# Patient Record
Sex: Male | Born: 1955
Health system: Southern US, Community
[De-identification: ages and names within clinical notes are randomized; demographics above are authoritative.]

## PROBLEM LIST (undated history)

## (undated) ENCOUNTER — Emergency Department (HOSPITAL_COMMUNITY): Admission: EM | Payer: BC Managed Care – PPO | Source: Home / Self Care

## (undated) DIAGNOSIS — J45909 Unspecified asthma, uncomplicated: Secondary | ICD-10-CM

## (undated) DIAGNOSIS — K8020 Calculus of gallbladder without cholecystitis without obstruction: Secondary | ICD-10-CM

## (undated) DIAGNOSIS — D649 Anemia, unspecified: Secondary | ICD-10-CM

## (undated) DIAGNOSIS — I5032 Chronic diastolic (congestive) heart failure: Secondary | ICD-10-CM

## (undated) DIAGNOSIS — E119 Type 2 diabetes mellitus without complications: Secondary | ICD-10-CM

## (undated) DIAGNOSIS — I313 Pericardial effusion (noninflammatory): Secondary | ICD-10-CM

## (undated) DIAGNOSIS — Z789 Other specified health status: Secondary | ICD-10-CM

## (undated) DIAGNOSIS — M109 Gout, unspecified: Secondary | ICD-10-CM

## (undated) DIAGNOSIS — N1411 Contrast-induced nephropathy: Secondary | ICD-10-CM

## (undated) DIAGNOSIS — G510 Bell's palsy: Secondary | ICD-10-CM

## (undated) DIAGNOSIS — I3139 Other pericardial effusion (noninflammatory): Secondary | ICD-10-CM

## (undated) DIAGNOSIS — N200 Calculus of kidney: Secondary | ICD-10-CM

## (undated) DIAGNOSIS — N141 Nephropathy induced by other drugs, medicaments and biological substances: Secondary | ICD-10-CM

## (undated) DIAGNOSIS — I252 Old myocardial infarction: Secondary | ICD-10-CM

## (undated) DIAGNOSIS — J449 Chronic obstructive pulmonary disease, unspecified: Secondary | ICD-10-CM

## (undated) DIAGNOSIS — I2510 Atherosclerotic heart disease of native coronary artery without angina pectoris: Secondary | ICD-10-CM

## (undated) DIAGNOSIS — I219 Acute myocardial infarction, unspecified: Secondary | ICD-10-CM

## (undated) DIAGNOSIS — G629 Polyneuropathy, unspecified: Secondary | ICD-10-CM

## (undated) DIAGNOSIS — I10 Essential (primary) hypertension: Secondary | ICD-10-CM

## (undated) DIAGNOSIS — N183 Chronic kidney disease, stage 3 unspecified: Secondary | ICD-10-CM

## (undated) DIAGNOSIS — K219 Gastro-esophageal reflux disease without esophagitis: Secondary | ICD-10-CM

## (undated) DIAGNOSIS — T508X5A Adverse effect of diagnostic agents, initial encounter: Secondary | ICD-10-CM

## (undated) DIAGNOSIS — G4730 Sleep apnea, unspecified: Secondary | ICD-10-CM

## (undated) DIAGNOSIS — F419 Anxiety disorder, unspecified: Secondary | ICD-10-CM

## (undated) DIAGNOSIS — R112 Nausea with vomiting, unspecified: Secondary | ICD-10-CM

## (undated) DIAGNOSIS — A0472 Enterocolitis due to Clostridium difficile, not specified as recurrent: Secondary | ICD-10-CM

## (undated) DIAGNOSIS — Z9889 Other specified postprocedural states: Secondary | ICD-10-CM

## (undated) DIAGNOSIS — E785 Hyperlipidemia, unspecified: Secondary | ICD-10-CM

## (undated) HISTORY — PX: COLONOSCOPY: SHX174

## (undated) HISTORY — PX: CIRCUMCISION: SUR203

## (undated) HISTORY — DX: Hyperlipidemia, unspecified: E78.5

## (undated) HISTORY — DX: Sleep apnea, unspecified: G47.30

## (undated) HISTORY — PX: CHEST TUBE INSERTION: SHX231

## (undated) HISTORY — DX: Calculus of kidney: N20.0

## (undated) HISTORY — DX: Atherosclerotic heart disease of native coronary artery without angina pectoris: I25.10

## (undated) HISTORY — PX: EYE SURGERY: SHX253

## (undated) HISTORY — DX: Unspecified asthma, uncomplicated: J45.909

## (undated) HISTORY — DX: Calculus of gallbladder without cholecystitis without obstruction: K80.20

## (undated) HISTORY — PX: CARDIAC CATHETERIZATION: SHX172

## (undated) HISTORY — PX: OTHER SURGICAL HISTORY: SHX169

## (undated) HISTORY — PX: ESOPHAGOGASTRODUODENOSCOPY: SHX1529

## (undated) HISTORY — DX: Gout, unspecified: M10.9

---

## 2001-05-01 ENCOUNTER — Emergency Department (HOSPITAL_COMMUNITY): Admission: EM | Admit: 2001-05-01 | Discharge: 2001-05-01 | Payer: Self-pay | Admitting: *Deleted

## 2002-08-28 ENCOUNTER — Encounter: Payer: Self-pay | Admitting: Family Medicine

## 2002-08-28 ENCOUNTER — Emergency Department (HOSPITAL_COMMUNITY): Admission: EM | Admit: 2002-08-28 | Discharge: 2002-08-28 | Payer: Self-pay | Admitting: Emergency Medicine

## 2002-08-28 ENCOUNTER — Inpatient Hospital Stay (HOSPITAL_COMMUNITY): Admission: AD | Admit: 2002-08-28 | Discharge: 2002-08-31 | Payer: Self-pay | Admitting: Cardiology

## 2002-11-26 ENCOUNTER — Emergency Department (HOSPITAL_COMMUNITY): Admission: EM | Admit: 2002-11-26 | Discharge: 2002-11-26 | Payer: Self-pay | Admitting: *Deleted

## 2002-11-26 ENCOUNTER — Encounter: Payer: Self-pay | Admitting: *Deleted

## 2002-11-27 ENCOUNTER — Observation Stay (HOSPITAL_COMMUNITY): Admission: EM | Admit: 2002-11-27 | Discharge: 2002-11-28 | Payer: Self-pay | Admitting: Emergency Medicine

## 2002-11-27 ENCOUNTER — Ambulatory Visit (HOSPITAL_COMMUNITY): Admission: RE | Admit: 2002-11-27 | Discharge: 2002-11-27 | Payer: Self-pay | Admitting: Family Medicine

## 2002-11-27 ENCOUNTER — Encounter: Payer: Self-pay | Admitting: Family Medicine

## 2002-11-27 ENCOUNTER — Encounter: Payer: Self-pay | Admitting: Neurological Surgery

## 2002-11-27 ENCOUNTER — Encounter: Payer: Self-pay | Admitting: Emergency Medicine

## 2003-01-13 ENCOUNTER — Emergency Department (HOSPITAL_COMMUNITY): Admission: EM | Admit: 2003-01-13 | Discharge: 2003-01-13 | Payer: Self-pay | Admitting: Emergency Medicine

## 2003-01-13 ENCOUNTER — Encounter: Payer: Self-pay | Admitting: Emergency Medicine

## 2003-05-11 ENCOUNTER — Ambulatory Visit (HOSPITAL_COMMUNITY): Admission: RE | Admit: 2003-05-11 | Discharge: 2003-05-11 | Payer: Self-pay | Admitting: Family Medicine

## 2003-05-11 ENCOUNTER — Encounter: Payer: Self-pay | Admitting: Family Medicine

## 2003-09-07 ENCOUNTER — Emergency Department (HOSPITAL_COMMUNITY): Admission: EM | Admit: 2003-09-07 | Discharge: 2003-09-07 | Payer: Self-pay | Admitting: *Deleted

## 2004-03-20 ENCOUNTER — Emergency Department (HOSPITAL_COMMUNITY): Admission: EM | Admit: 2004-03-20 | Discharge: 2004-03-20 | Payer: Self-pay | Admitting: Specialist

## 2004-09-12 ENCOUNTER — Observation Stay (HOSPITAL_COMMUNITY): Admission: EM | Admit: 2004-09-12 | Discharge: 2004-09-13 | Payer: Self-pay | Admitting: *Deleted

## 2004-09-19 ENCOUNTER — Ambulatory Visit (HOSPITAL_COMMUNITY): Admission: RE | Admit: 2004-09-19 | Discharge: 2004-09-19 | Payer: Self-pay | Admitting: Family Medicine

## 2010-09-16 ENCOUNTER — Encounter: Payer: Self-pay | Admitting: Family Medicine

## 2011-05-23 ENCOUNTER — Ambulatory Visit (HOSPITAL_COMMUNITY)
Admission: RE | Admit: 2011-05-23 | Discharge: 2011-05-23 | Disposition: A | Discharge status: Home / Self Care | Payer: BC Managed Care – PPO | Source: Ambulatory Visit | Attending: Pulmonary Disease | Admitting: Pulmonary Disease

## 2011-05-23 ENCOUNTER — Ambulatory Visit (HOSPITAL_COMMUNITY)
Admission: RE | Admit: 2011-05-23 | Disposition: A | Discharge status: Home / Self Care | Payer: BC Managed Care – PPO | Source: Ambulatory Visit | Attending: Pulmonary Disease | Admitting: Pulmonary Disease

## 2011-05-23 DIAGNOSIS — R0602 Shortness of breath: Secondary | ICD-10-CM | POA: Insufficient documentation

## 2011-05-23 LAB — BLOOD GAS, ARTERIAL
Acid-Base Excess: 0 mmol/L (ref 0.0–2.0)
TCO2: 22.1 mmol/L (ref 0–100)
pCO2 arterial: 45 mmHg (ref 35.0–45.0)
pO2, Arterial: 59.5 mmHg — ABNORMAL LOW (ref 80.0–100.0)

## 2011-05-25 ENCOUNTER — Emergency Department (HOSPITAL_COMMUNITY): Payer: BC Managed Care – PPO

## 2011-05-25 ENCOUNTER — Observation Stay (HOSPITAL_COMMUNITY)
Admission: EM | Admit: 2011-05-25 | Discharge: 2011-05-30 | Disposition: A | Discharge status: Home / Self Care | Payer: BC Managed Care – PPO | Attending: Internal Medicine | Admitting: Internal Medicine

## 2011-05-25 ENCOUNTER — Other Ambulatory Visit: Payer: Self-pay

## 2011-05-25 DIAGNOSIS — K8020 Calculus of gallbladder without cholecystitis without obstruction: Secondary | ICD-10-CM | POA: Diagnosis present

## 2011-05-25 DIAGNOSIS — R0609 Other forms of dyspnea: Secondary | ICD-10-CM

## 2011-05-25 DIAGNOSIS — R109 Unspecified abdominal pain: Secondary | ICD-10-CM

## 2011-05-25 DIAGNOSIS — I10 Essential (primary) hypertension: Secondary | ICD-10-CM

## 2011-05-25 DIAGNOSIS — R112 Nausea with vomiting, unspecified: Secondary | ICD-10-CM | POA: Insufficient documentation

## 2011-05-25 DIAGNOSIS — R0602 Shortness of breath: Secondary | ICD-10-CM

## 2011-05-25 DIAGNOSIS — R079 Chest pain, unspecified: Secondary | ICD-10-CM | POA: Diagnosis present

## 2011-05-25 DIAGNOSIS — E119 Type 2 diabetes mellitus without complications: Secondary | ICD-10-CM

## 2011-05-25 DIAGNOSIS — R0789 Other chest pain: Principal | ICD-10-CM | POA: Insufficient documentation

## 2011-05-25 HISTORY — DX: Essential (primary) hypertension: I10

## 2011-05-25 HISTORY — DX: Bell's palsy: G51.0

## 2011-05-25 LAB — PROTIME-INR: Prothrombin Time: 12.8 seconds (ref 11.6–15.2)

## 2011-05-25 LAB — GLUCOSE, CAPILLARY
Glucose-Capillary: 236 mg/dL — ABNORMAL HIGH (ref 70–99)
Glucose-Capillary: 204 mg/dL — ABNORMAL HIGH (ref 70–99)

## 2011-05-25 LAB — BASIC METABOLIC PANEL
BUN: 26 mg/dL — ABNORMAL HIGH (ref 6–23)
CO2: 24 mEq/L (ref 19–32)
Chloride: 97 mEq/L (ref 96–112)
Creatinine, Ser: 0.86 mg/dL (ref 0.50–1.35)

## 2011-05-25 LAB — HEPATIC FUNCTION PANEL
AST: 16 U/L (ref 0–37)
Bilirubin, Direct: 0.1 mg/dL (ref 0.0–0.3)
Indirect Bilirubin: 0.4 mg/dL (ref 0.3–0.9)

## 2011-05-25 LAB — CARDIAC PANEL(CRET KIN+CKTOT+MB+TROPI)
Relative Index: 3.1 — ABNORMAL HIGH (ref 0.0–2.5)
Relative Index: 3.2 — ABNORMAL HIGH (ref 0.0–2.5)
Relative Index: 3.1 — ABNORMAL HIGH (ref 0.0–2.5)
Total CK: 166 U/L (ref 7–232)
Total CK: 153 U/L (ref 7–232)
Total CK: 148 U/L (ref 7–232)
Troponin I: <0.3 ng/mL (ref <0.30)

## 2011-05-25 LAB — LIPASE, BLOOD: Lipase: 13 U/L (ref 11–59)

## 2011-05-25 LAB — CBC
HCT: 35.9 % — ABNORMAL LOW (ref 39.0–52.0)
Hemoglobin: 12.9 g/dL — ABNORMAL LOW (ref 13.0–17.0)
MCV: 84.9 fL (ref 78.0–100.0)
RBC: 4.23 MIL/uL (ref 4.22–5.81)
WBC: 6.2 10*3/uL (ref 4.0–10.5)

## 2011-05-25 LAB — D-DIMER, QUANTITATIVE: D-Dimer, Quant: 0.39 ug/mL-FEU (ref 0.00–0.48)

## 2011-05-25 MED ORDER — SODIUM CHLORIDE 0.9 % IJ SOLN
3.0000 mL | Freq: Two times a day (BID) | INTRAMUSCULAR | Status: DC
  Administered 2011-05-25: 3 mL via INTRAVENOUS
  Administered 2011-05-26: 23:00:00 via INTRAVENOUS
  Administered 2011-05-26 – 2011-05-30 (×8): 3 mL via INTRAVENOUS
  Filled 2011-05-25 (×10): qty 3

## 2011-05-25 MED ORDER — ALBUTEROL SULFATE (5 MG/ML) 0.5% IN NEBU: 2.5000 mg | INHALATION_SOLUTION | RESPIRATORY_TRACT | Status: DC | PRN

## 2011-05-25 MED ORDER — ONDANSETRON HCL 4 MG PO TABS: 4.0000 mg | ORAL_TABLET | Freq: Four times a day (QID) | ORAL | Status: DC | PRN

## 2011-05-25 MED ORDER — PANTOPRAZOLE SODIUM 40 MG IV SOLR
40.0000 mg | INTRAVENOUS | Status: DC
  Administered 2011-05-25: 40 mg via INTRAVENOUS
  Filled 2011-05-25: qty 40

## 2011-05-25 MED ORDER — GABAPENTIN 300 MG PO CAPS
300.0000 mg | ORAL_CAPSULE | Freq: Three times a day (TID) | ORAL | Status: DC
  Administered 2011-05-25 – 2011-05-30 (×14): 300 mg via ORAL
  Filled 2011-05-25 (×14): qty 1

## 2011-05-25 MED ORDER — ACETAMINOPHEN 650 MG RE SUPP: 650.0000 mg | Freq: Four times a day (QID) | RECTAL | Status: DC | PRN

## 2011-05-25 MED ORDER — NITROGLYCERIN 2 % TD OINT
1.0000 [in_us] | TOPICAL_OINTMENT | Freq: Once | TRANSDERMAL | Status: AC
  Administered 2011-05-25: 1 [in_us] via TOPICAL
  Filled 2011-05-25: qty 1

## 2011-05-25 MED ORDER — MORPHINE SULFATE 2 MG/ML IJ SOLN: 2.0000 mg | INTRAMUSCULAR | Status: DC | PRN

## 2011-05-25 MED ORDER — ASPIRIN EC 81 MG PO TBEC
81.0000 mg | DELAYED_RELEASE_TABLET | Freq: Every day | ORAL | Status: DC
  Administered 2011-05-25 – 2011-05-30 (×6): 81 mg via ORAL
  Filled 2011-05-25 (×6): qty 1

## 2011-05-25 MED ORDER — ACETAMINOPHEN 325 MG PO TABS: 650.0000 mg | ORAL_TABLET | Freq: Four times a day (QID) | ORAL | Status: DC | PRN

## 2011-05-25 MED ORDER — ALBUTEROL SULFATE HFA 108 (90 BASE) MCG/ACT IN AERS
2.0000 | INHALATION_SPRAY | Freq: Four times a day (QID) | RESPIRATORY_TRACT | Status: DC
  Administered 2011-05-25 – 2011-05-26 (×5): 2 via RESPIRATORY_TRACT
  Filled 2011-05-25: qty 6.7

## 2011-05-25 MED ORDER — OXYCODONE HCL 5 MG PO TABS
5.0000 mg | ORAL_TABLET | ORAL | Status: DC | PRN
  Administered 2011-05-26: 5 mg via ORAL
  Filled 2011-05-25: qty 1

## 2011-05-25 MED ORDER — ONDANSETRON HCL 4 MG/2ML IJ SOLN: 4.0000 mg | Freq: Four times a day (QID) | INTRAMUSCULAR | Status: DC | PRN

## 2011-05-25 MED ORDER — SODIUM CHLORIDE 0.9 % IJ SOLN
INTRAMUSCULAR | Status: AC
  Administered 2011-05-25: 10 mL
  Filled 2011-05-25: qty 10

## 2011-05-25 MED ORDER — INSULIN GLARGINE 100 UNIT/ML ~~LOC~~ SOLN
80.0000 [IU] | Freq: Every day | SUBCUTANEOUS | Status: DC
  Administered 2011-05-25: 80 [IU] via SUBCUTANEOUS
  Filled 2011-05-25: qty 3

## 2011-05-25 MED ORDER — SODIUM CHLORIDE 0.9 % IV SOLN
Freq: Once | INTRAVENOUS | Status: AC
  Administered 2011-05-25: 1000 mL via INTRAVENOUS

## 2011-05-25 MED ORDER — NITROGLYCERIN 0.4 MG SL SUBL
0.4000 mg | SUBLINGUAL_TABLET | Freq: Once | SUBLINGUAL | Status: AC
  Administered 2011-05-25: 0.4 mg via SUBLINGUAL
  Filled 2011-05-25: qty 25

## 2011-05-25 MED ORDER — INSULIN ASPART 100 UNIT/ML ~~LOC~~ SOLN
0.0000 [IU] | Freq: Three times a day (TID) | SUBCUTANEOUS | Status: DC
  Administered 2011-05-26 (×2): 3 [IU] via SUBCUTANEOUS
  Administered 2011-05-26: 5 [IU] via SUBCUTANEOUS
  Administered 2011-05-27 – 2011-05-29 (×7): 2 [IU] via SUBCUTANEOUS
  Administered 2011-05-29: 3 [IU] via SUBCUTANEOUS
  Administered 2011-05-29: 2 [IU] via SUBCUTANEOUS
  Administered 2011-05-30 (×2): 3 [IU] via SUBCUTANEOUS
  Filled 2011-05-25: qty 3

## 2011-05-25 MED ORDER — ALBUTEROL SULFATE (5 MG/ML) 0.5% IN NEBU: 2.5000 mg | INHALATION_SOLUTION | RESPIRATORY_TRACT | Status: DC

## 2011-05-25 MED ORDER — LISINOPRIL 10 MG PO TABS
20.0000 mg | ORAL_TABLET | Freq: Every day | ORAL | Status: DC
  Administered 2011-05-25 – 2011-05-27 (×3): 20 mg via ORAL
  Filled 2011-05-25 (×3): qty 2

## 2011-05-25 MED ORDER — ASPIRIN 81 MG PO CHEW
324.0000 mg | CHEWABLE_TABLET | Freq: Once | ORAL | Status: AC
  Administered 2011-05-25: 324 mg via ORAL
  Filled 2011-05-25: qty 4

## 2011-05-25 MED ORDER — ONDANSETRON HCL 4 MG/2ML IJ SOLN: 4.0000 mg | Freq: Three times a day (TID) | INTRAMUSCULAR | Status: DC | PRN

## 2011-05-25 MED ORDER — SENNA 8.6 MG PO TABS: 2.0000 | ORAL_TABLET | Freq: Every day | ORAL | Status: DC | PRN

## 2011-05-25 NOTE — ED Notes (Signed)
Trisha Mangle, MD at the bedside for evaluation of the patient.

## 2011-05-25 NOTE — ED Provider Notes (Addendum)
Scribed for Eugene Mangle, MD, the patient was seen in room APA02/APA02. This chart was scribed by [employer]. The patient's care started at 18:47  CSN: [id number] Arrival date & time: 05/25/2011  5:38 PM  Chief Complaint  Patient presents with  . Chest Pain    30 min pta  . Shortness of Breath    all the time, worse today  . Numbness    30 min pta  . Neck Pain    1 week   HPI Eugene [name] is a 55 y.o. male who presents to the Emergency Department complaining of Left side Chest pain that started 30 minutes before arriving the ER. Patient also complains of left neck pain that radiates left arm lasting 1 week. Chest pain is currently resolved with persisting neck pain. Chest pain is without any exacerbating or provoking factors. Patient is short of breath at baseline but complains of worsening symptoms today.  Shortness of breath is aggravated by walking and physical activity. Patient denies nausea, vomiting, diaphoresis. Reports a history of a stress test done three months ago that resulted good.  HPI ELEMENTS: Location: Chest  Modifying factors: Shortness of breath aggravated with walking and physical activity Context: as above  Associated symptoms: shortness of breath, neck pain radiating into the left arm.    Past Medical History  Diagnosis Date  . Hypertension   . Hypercholesteremia   . Diabetes mellitus     History reviewed. No pertinent past surgical history.  Family history.[name] Heart Attack - Mother/Uncle  History  Substance Use Topics  . Smoking status: Never Smoker   . Smokeless tobacco: Not on file  . Alcohol Use: No      Review of Systems  Respiratory: Positive for cough and shortness of breath.   Gastrointestinal: Negative for nausea, vomiting and diarrhea.  Neurological: Positive for numbness (left arm).  All other systems reviewed and are negative.    Allergies  Review of patient's allergies indicates no known allergies.  Home Medications    Current Outpatient Rx  Name Route Sig Dispense Refill  . GABAPENTIN 300 MG PO CAPS Oral Take 300 mg by mouth 3 (three) times daily.      . INSULIN GLARGINE 100 UNIT/ML Ling[place]ore SOLN Subcutaneous Inject 80 Units into the skin at bedtime.      [name] LISINOPRIL 20 MG PO TABS Oral Take 20 mg by mouth at bedtime.      [name] METFORMIN HCL 500 MG PO TABS Oral Take 500 mg by mouth 2 (two) times daily with a meal.      . UNKNOWN TO PATIENT Inhalation Inhale 1 puff into the lungs 2 (two) times daily. HFA Given by Physician: NAME UNKNOWN       BP 137/77  Pulse 83  Temp(Src) 98.5 F (36.9 C) (Oral)  Resp 18  Ht 6\' 1"  (1.854 m)  Wt 234 lb (106.142 kg)  BMI 30.87 kg/m2  SpO2 100%  Physical Exam  Nursing note and vitals reviewed. Constitutional: He is oriented to person, place, and time. He appears well-developed and well-nourished. No distress.       Awake, alert, nontoxic appearance.  HENT:  Head: Normocephalic and atraumatic.  Eyes: Conjunctivae are normal. Pupils are equal, round, and reactive to light. Right eye exhibits no discharge. Left eye exhibits no discharge.  Neck: Normal range of motion. Neck supple. No tracheal deviation present.  Cardiovascular: Normal rate, regular rhythm and normal heart sounds.   Pulmonary/Chest: Effort normal. He has wheezes (scattered).  He has no rales. He exhibits no tenderness (pain's not reproducible ).  Abdominal: Soft. Bowel sounds are normal. He exhibits no distension. There is no tenderness. There is no rebound.  Musculoskeletal: He exhibits no edema and no tenderness.       Baseline ROM, no obvious new focal weakness.  Lymphadenopathy:    He has no cervical adenopathy.  Neurological: He is alert and oriented to person, place, and time. No cranial nerve deficit.       Mental status and motor strength appears baseline for patient and situation.  Skin: Skin is warm. No rash noted. He is not diaphoretic. No erythema.  Psychiatric: He has a normal mood and affect.  His behavior is normal.    ED Course  Procedures  OTHER DATA REVIEWED: Nursing notes, vital signs, and past medical records reviewed.    DIAGNOSTIC STUDIES: Oxygen Saturation is 100% on 2 liters/min via Patient connected to nasal cannula oxygen, normal by my interpretation.    LABS / RADIOLOGY:  Results for orders placed during the hospital encounter of 05/25/11  CBC      Component Value Range   WBC 6.2  4.0 - 10.5 (K/uL)   RBC 4.23  4.22 - 5.81 (MIL/uL)   Hemoglobin 12.9 (*) 13.0 - 17.0 (g/dL)   HCT 35.9 (*) 39.0 - 52.0 (%)   MCV 84.9  78.0 - 100.0 (fL)   MCH 30.5  26.0 - 34.0 (pg)   MCHC 35.9  30.0 - 36.0 (g/dL)   RDW 12.9  11.5 - 15.5 (%)   Platelets 153  150 - 400 (K/uL)  BASIC METABOLIC PANEL      Component Value Range   Sodium 132 (*) 135 - 145 (mEq/L)   Potassium 4.2  3.5 - 5.1 (mEq/L)   Chloride 97  96 - 112 (mEq/L)   CO2 24  19 - 32 (mEq/L)   Glucose, Bld 251 (*) 70 - 99 (mg/dL)   BUN 26 (*) 6 - 23 (mg/dL)   Creatinine, Ser 0.86  0.50 - 1.35 (mg/dL)   Calcium 9.8  8.4 - 10.5 (mg/dL)   GFR calc non Af Amer >60  >60 (mL/min)   GFR calc Af Amer >60  >60 (mL/min)  CARDIAC PANEL(CRET KIN+CKTOT+MB+TROPI)      Component Value Range   Total CK 166  7 - 232 (U/L)   CK, MB 5.1 (*) 0.3 - 4.0 (ng/mL)   Troponin I <0.30  <0.30 (ng/mL)   Relative Index 3.1 (*) 0.0 - 2.5   GLUCOSE, CAPILLARY      Component Value Range   Glucose-Capillary 236 (*) 70 - 99 (mg/dL)   Comment 1 Documented in Chart     Comment 2 Notify RN    PROTIME-INR      Component Value Range   Prothrombin Time 12.8  11.6 - 15.2 (seconds)   INR 0.94  0.00 - 1.49     Dg Chest 2 View  05/25/2011  *RADIOLOGY REPORT*  Clinical Data: Chest pain  CHEST - 2 VIEW  Comparison: 03/20/2004  Findings: Lungs are clear. No pleural effusion or pneumothorax.  Cardiomediastinal silhouette is within normal limits.  Mild degenerative changes of the visualized thoracolumbar spine.  IMPRESSION: No evidence of acute  cardiopulmonary disease.  Original Report Authenticated By: Julian Hy, M.D.     Date: 05/25/2011  Rate: 89  Rhythm: normal sinus rhythm  QRS Axis: right  Intervals: normal  ST/T Wave abnormalities: nonspecific ST changes  Conduction Disutrbances:right bundle branch block  Narrative Interpretation: incomplete RBB  Old EKG Reviewed: unchanged  ED COURSE / COORDINATION OF CARE: 19:00 - EDMD examined patient and ordered the following Orders Placed This Encounter  Procedures  . DG Chest 2 View  . CBC  . Basic metabolic panel  . Cardiac panel(cret kin+cktot+mb+tropi)  . Glucose, capillary  . Protime-INR  . Cardiac panel(cret kin+cktot+mb+tropi)  . Cardiac monitoring  . Draw & Hold Blood RAINBOW  . Weigh patient  . Consult to internal medicine  . Pulse oximetry, continuous  . Oxygen therapy  . ED EKG  . Bed Request ED to IP     MDM: The patient has a heart score greater than for due to multiple risk factors. He is a family history, diabetes, hypertension. Patient has left-sided chest pain but is not specifically anginal. Cardiac workup was obtained on patient triage. Troponin was negative but he did have slightly elevated CK-MB and relative index. I discussed this with the hospitalist who desired for the patient to receive an additional lab draw to repeat cardiac markers. Chest x-ray was also performed and is unremarkable. Patient received aspirin prior to arrival to emergency department. He became pain-free after a single nitroglycerin and nitro paste was placed on the chest to maintain his pain-free status. Option was placed on the patient with improvement of her shortness of breath. His lungs are relatively clear on auscultation. i feel the patient warrants further cardiac evaluation. He has no prior visits with a cardiologist. He states he did have some sort of a stress test 3 months ago  IMPRESSION: Diagnoses that have been ruled out:  Diagnoses that are still under  consideration:  Final diagnoses:  Chest pain  Shortness of breath    PLAN:  Admitted to [facility] the case was discussed with the admitting physician Dr Maryland Pink The patient is to return the emergency department if there is any worsening of symptoms. I have reviewed the discharge instructions with the patient and family   MEDICATIONS GIVEN IN THE E.D.  Medications  lisinopril (PRINIVIL,ZESTRIL) 20 MG tablet (not administered)  insulin glargine (LANTUS SOLOSTAR) 100 UNIT/ML injection (not administered)  metFORMIN (GLUCOPHAGE) 500 MG tablet (not administered)  gabapentin (NEURONTIN) 300 MG capsule (not administered)  UNKNOWN TO PATIENT (not administered)  aspirin chewable tablet 324 mg (324 mg Oral Given 05/25/11 1810)  nitroGLYCERIN (NITROSTAT) SL tablet 0.4 mg (0.4 mg Sublingual Given 05/25/11 1817)  0.9 %  sodium chloride infusion (1000 mL Intravenous New Bag 05/25/11 1822)  nitroGLYCERIN (NITROGLYN) 2 % ointment 1 inch (1 inch Topical Given 05/25/11 1900)    DISCHARGE MEDICATIONS: New Prescriptions   No medications on file    SCRIBE ATTESTATION: I personally performed the services described in this documentation, which was scribed in my presence. The recorded information has been reviewed and considered.          Eugene Mangle, MD 05/25/11 1924  Eugene Mangle, MD 05/25/11 [phone]

## 2011-05-25 NOTE — H&P (Signed)
Eugene [name] is an 55 y.o. male.  His primary care physician is Dr. Christen Bame in [place], [place]. He recently saw Dr. Sinda Du, pulmonologist, for shortness of breath.  Chief Complaint: Chest pain  HPI: This is a 55 year old, Caucasian male, who presented to the ED today with complaints of left-sided chest pain, and arm pain. He has a history of, diabetes, hypertension. He tells me that till about 3 months ago he was fairly active and then he started noticing that he was getting short of breath with exertion. He went to see his doctor. It looks like he had the quite a bit of workup in the form of a stress test and echocardiogram and carotid Dopplers. So far now significant abnormalities have been found. Subsequently, he was referred to a pulmonologist and underwent a Pulmonary function test this past week. He does not have the results of the same.  Today at around 4 or 4:30 he was sitting and started experiencing some neck pain followed by left arm pain. And, then he also started having this sharp, left-sided chest pain. It was 2-3/10 in intensity. He decided to come in to the ED. Denies any nausea, vomiting, did not have any palpitations. Denies any leg swelling. This was associated with some shortness of breath as well. He's had a cough for the last 3 months, which is a dry cough. He did feel lightheaded today and has been feeling lightheaded for the last 3 months with exertion. And, now, since he's been in the emergency department he is complaining also of some pain in his left upper abdomen as well. Denies any blood in the stools or black colored stools. He denies smoking cigarettes. No precipitating, aggravating or relieving factors for his chest pain.   Prior to Admission medications   Medication Sig Start Date End Date Taking? Authorizing Provider  gabapentin (NEURONTIN) 300 MG capsule Take 300 mg by mouth 3 (three) times daily.     Yes Historical Provider, MD  insulin  glargine (LANTUS SOLOSTAR) 100 UNIT/ML injection Inject 80 Units into the skin at bedtime.     Yes Historical Provider, MD  lisinopril (PRINIVIL,ZESTRIL) 20 MG tablet Take 20 mg by mouth at bedtime.     Yes Historical Provider, MD  metFORMIN (GLUCOPHAGE) 500 MG tablet Take 500 mg by mouth 2 (two) times daily with a meal.     Yes Historical Provider, MD  UNKNOWN TO PATIENT Inhale 1 puff into the lungs 2 (two) times daily. HFA Given by Physician: NAME UNKNOWN    Yes Historical Provider, MD    Allergies: No Known Allergies  Past Medical History  Diagnosis Date  . Hypertension   . Hypercholesteremia   . Diabetes mellitus   . Bell palsy     Past Surgical History  Procedure Date  . Circumcision     Social History:  reports that he has never smoked. He does not have any smokeless tobacco history on file. He reports that he does not drink alcohol or use illicit drugs.  Family History:  Family History  Problem Relation Age of Onset  . Diabetes Mother   . Heart attack Mother     Review of Systems  Constitutional: Positive for weight loss. Negative for fever, chills and malaise/fatigue.  HENT: Negative.   Eyes: Negative.   Respiratory: Positive for cough and shortness of breath.   Cardiovascular: Positive for chest pain. Negative for claudication and leg swelling.  Gastrointestinal: Positive for abdominal pain.  Genitourinary: Negative.  Musculoskeletal: Negative.   Skin: Negative for itching and rash.  Neurological: Positive for weakness.  Endo/Heme/Allergies: Negative.   Psychiatric/Behavioral: Negative.     Blood pressure 137/77, pulse 83, temperature 98.5 F (36.9 C), temperature source Oral, resp. rate 18, height 6\' 1"  (1.854 m), weight 106.142 kg (234 lb), SpO2 100.00%. Physical Exam  Vitals reviewed. Constitutional: He is oriented to person, place, and time. He appears well-developed and well-nourished. No distress.  HENT:  Head: Normocephalic and atraumatic.  Nose:  Nose normal.  Mouth/Throat: No oropharyngeal exudate.  Eyes: EOM are normal. Pupils are equal, round, and reactive to light. Right eye exhibits no discharge. Left eye exhibits no discharge.  Neck: Normal range of motion. Neck supple. No JVD present. No tracheal deviation present. No thyromegaly present.  Cardiovascular: Normal rate, regular rhythm, normal heart sounds and intact distal pulses.  Exam reveals no gallop and no friction rub.   No murmur heard. Pulmonary/Chest: Effort normal and breath sounds normal. No stridor. No respiratory distress. He has no wheezes. He has no rales. He exhibits no tenderness.  Abdominal: Soft. Bowel sounds are normal. There is no hepatosplenomegaly. There is tenderness in the epigastric area and left upper quadrant. There is no rigidity, no rebound, no guarding, no tenderness at McBurney's point and negative Murphy's sign. No hernia.  Musculoskeletal: Normal range of motion.  Neurological: He is alert and oriented to person, place, and time. No cranial nerve deficit. Coordination normal.  Skin: Skin is warm and dry. He is not diaphoretic.  Psychiatric: He has a normal mood and affect.     Results for orders placed during the hospital encounter of 05/25/11 (from the past 48 hour(s))  CBC     Status: Abnormal   Collection Time   05/25/11  6:00 PM      Component Value Range Comment   WBC 6.2  4.0 - 10.5 (K/uL)    RBC 4.23  4.22 - 5.81 (MIL/uL)    Hemoglobin 12.9 (*) 13.0 - 17.0 (g/dL)    HCT 35.9 (*) 39.0 - 52.0 (%)    MCV 84.9  78.0 - 100.0 (fL)    MCH 30.5  26.0 - 34.0 (pg)    MCHC 35.9  30.0 - 36.0 (g/dL)    RDW 12.9  11.5 - 15.5 (%)    Platelets 153  150 - 400 (K/uL)   BASIC METABOLIC PANEL     Status: Abnormal   Collection Time   05/25/11  6:00 PM      Component Value Range Comment   Sodium 132 (*) 135 - 145 (mEq/L)    Potassium 4.2  3.5 - 5.1 (mEq/L)    Chloride 97  96 - 112 (mEq/L)    CO2 24  19 - 32 (mEq/L)    Glucose, Bld 251 (*) 70 - 99  (mg/dL)    BUN 26 (*) 6 - 23 (mg/dL)    Creatinine, Ser 0.86  0.50 - 1.35 (mg/dL)    Calcium 9.8  8.4 - 10.5 (mg/dL)    GFR calc non Af Amer >60  >60 (mL/min)    GFR calc Af Amer >60  >60 (mL/min)   CARDIAC PANEL(CRET KIN+CKTOT+MB+TROPI)     Status: Abnormal   Collection Time   05/25/11  6:00 PM      Component Value Range Comment   Total CK 166  7 - 232 (U/L)    CK, MB 5.1 (*) 0.3 - 4.0 (ng/mL)    Troponin I <0.30  <0.30 (ng/mL)  Relative Index 3.1 (*) 0.0 - 2.5    PROTIME-INR     Status: Normal   Collection Time   05/25/11  6:00 PM      Component Value Range Comment   Prothrombin Time 12.8  11.6 - 15.2 (seconds)    INR 0.94  0.00 - 1.49    GLUCOSE, CAPILLARY     Status: Abnormal   Collection Time   05/25/11  6:14 PM      Component Value Range Comment   Glucose-Capillary 236 (*) 70 - 99 (mg/dL)    Comment 1 Documented in Chart      Comment 2 Notify RN     CARDIAC PANEL(CRET KIN+CKTOT+MB+TROPI)     Status: Abnormal   Collection Time   05/25/11  7:42 PM      Component Value Range Comment   Total CK 153  7 - 232 (U/L)    CK, MB 4.9 (*) 0.3 - 4.0 (ng/mL)    Troponin I <0.30  <0.30 (ng/mL)    Relative Index 3.2 (*) 0.0 - 2.5     Dg Chest 2 View  05/25/2011  *RADIOLOGY REPORT*  Clinical Data: Chest pain  CHEST - 2 VIEW  Comparison: 03/20/2004  Findings: Lungs are clear. No pleural effusion or pneumothorax.  Cardiomediastinal silhouette is within normal limits.  Mild degenerative changes of the visualized thoracolumbar spine.  IMPRESSION: No evidence of acute cardiopulmonary disease.  Original Report Authenticated By: Julian Hy, M.D.   EKG was done, and it shows, a sinus rhythm at 89, with normal axis. Intervals appear to be in the normal range. No Q waves appreciated on this EKG. No concerning ST or T-wave changes are noted. No older EKGs available for comparison.  Assessment/Plan  Principal Problem:  *Chest pain Active Problems:  Dyspnea on exertion  DM type 2 (diabetes  mellitus, type 2)  HTN (hypertension)  Abdominal pain of unknown etiology   Plan:   #1 left-sided chest pain: Etiology remains unclear. It could be related to the the upper abdominal discomfort that he is having now. He denies any heartburn. His EKG is nonacute. 2 sets of cardiac enzymes are negative. In view of a stress test done in June of this year it's unlikely this is cardiac. However, because of his risk factors in the form of diabetes, hypertension we will admit him to the hospital and rule him out for acute coronary syndrome. Aspirin will be given. Check d-dimer.  #2 upper abdominal pain: Will check a lipase level as well as his hepatic function tests. We'll put him on a PPI. Depending on the results of the lipase, and LFTs we will have to consider imaging studies.  #3 history of diabetes: Continue with Lantus insulin and put him on a sliding scale. HbA1c will be checked.  #4 history of hypertension. Continue with lisinopril.   #5 history of peripheral neuropathy: Continue with gabapentin.  #6 chronic dyspnea: This is being worked up by Dr. Luan Pulling as an outpatient. An ABG was done on 9/26 which showed low PO2. Etiology for his dyspnea and mild hypoxia is not entirely clear. If needed Dr. Luan Pulling may have to be involved in this patient's care in the hospital. We will put him on albuterol inhalers 4 times a day. Unclear if he has been evaluated for VTE. Will check d-dimer.  Patient is full code. DVT, prophylaxis will be utilized.  Further management decisions will depend on results of further testing and patient's response to treatment.  Dave Mergen 05/25/2011,  9:18 PM

## 2011-05-25 NOTE — ED Notes (Signed)
Patient is comfortable does not need anything at this time. 

## 2011-05-25 NOTE — ED Notes (Signed)
Cp 30 min pta, left side  Left arm numbness for 30 min ota, Sob al lthe time, worse today Neck pain for 1 week

## 2011-05-26 ENCOUNTER — Inpatient Hospital Stay (HOSPITAL_COMMUNITY): Payer: BC Managed Care – PPO

## 2011-05-26 LAB — BASIC METABOLIC PANEL
BUN: 23 mg/dL (ref 6–23)
Chloride: 94 mEq/L — ABNORMAL LOW (ref 96–112)
Creatinine, Ser: 1.05 mg/dL (ref 0.50–1.35)
GFR calc Af Amer: >60 mL/min (ref >60)
Glucose, Bld: 287 mg/dL — ABNORMAL HIGH (ref 70–99)

## 2011-05-26 LAB — COMPREHENSIVE METABOLIC PANEL
ALT: 14 U/L (ref 0–53)
Alkaline Phosphatase: 60 U/L (ref 39–117)
BUN: 26 mg/dL — ABNORMAL HIGH (ref 6–23)
CO2: 29 mEq/L (ref 19–32)
Calcium: 9.4 mg/dL (ref 8.4–10.5)
GFR calc Af Amer: >60 mL/min (ref >60)
GFR calc non Af Amer: >60 mL/min (ref >60)
Glucose, Bld: 203 mg/dL — ABNORMAL HIGH (ref 70–99)
Total Protein: 6.1 g/dL (ref 6.0–8.3)

## 2011-05-26 LAB — CBC
HCT: 34.2 % — ABNORMAL LOW (ref 39.0–52.0)
HCT: 33 % — ABNORMAL LOW (ref 39.0–52.0)
Hemoglobin: 12.1 g/dL — ABNORMAL LOW (ref 13.0–17.0)
MCHC: 35.4 g/dL (ref 30.0–36.0)
MCV: 85.5 fL (ref 78.0–100.0)
RDW: 12.8 % (ref 11.5–15.5)
RDW: 12.9 % (ref 11.5–15.5)
WBC: 6.1 10*3/uL (ref 4.0–10.5)
WBC: 6 10*3/uL (ref 4.0–10.5)

## 2011-05-26 LAB — GLUCOSE, CAPILLARY

## 2011-05-26 LAB — HEMOGLOBIN A1C
Hgb A1c MFr Bld: 10 % — ABNORMAL HIGH (ref <5.7)
Mean Plasma Glucose: 240 mg/dL — ABNORMAL HIGH (ref <117)

## 2011-05-26 LAB — CARDIAC PANEL(CRET KIN+CKTOT+MB+TROPI)
Relative Index: 3.7 — ABNORMAL HIGH (ref 0.0–2.5)
Troponin I: <0.3 ng/mL (ref <0.30)

## 2011-05-26 MED ORDER — PANTOPRAZOLE SODIUM 40 MG IV SOLR
40.0000 mg | Freq: Two times a day (BID) | INTRAVENOUS | Status: DC
  Administered 2011-05-26 – 2011-05-29 (×6): 40 mg via INTRAVENOUS
  Filled 2011-05-26 (×6): qty 40

## 2011-05-26 MED ORDER — ALBUTEROL SULFATE HFA 108 (90 BASE) MCG/ACT IN AERS
2.0000 | INHALATION_SPRAY | Freq: Three times a day (TID) | RESPIRATORY_TRACT | Status: DC
  Administered 2011-05-27 – 2011-05-30 (×9): 2 via RESPIRATORY_TRACT

## 2011-05-26 MED ORDER — INSULIN GLARGINE 100 UNIT/ML ~~LOC~~ SOLN
80.0000 [IU] | Freq: Every day | SUBCUTANEOUS | Status: DC
  Administered 2011-05-26 – 2011-05-29 (×4): 80 [IU] via SUBCUTANEOUS
  Filled 2011-05-26: qty 3

## 2011-05-26 MED ORDER — SODIUM CHLORIDE 0.9 % IJ SOLN
INTRAMUSCULAR | Status: AC
  Filled 2011-05-26: qty 10

## 2011-05-26 MED ORDER — IOHEXOL 300 MG/ML  SOLN
100.0000 mL | Freq: Once | INTRAMUSCULAR | Status: AC | PRN
  Administered 2011-05-26: 100 mL via INTRAVENOUS

## 2011-05-26 NOTE — Progress Notes (Signed)
Subjective: Feeling slightly better; currently without chest pain. Patient is still complaining of epigastric/left upper quadrant abdominal discomfort and associated shortness of breath with minimal exertion (for example: talking with me during interview, getting out of a walking to the bathroom). No fever. Patient reports associated nausea and worsening of his abdominal discomfort with food intake.  Objective: Vital signs in last 24 hours: Temp:  [97.7 F (36.5 C)-98.5 F (36.9 C)] 97.7 F (36.5 C) (09/29 0600) Pulse Rate:  [72-88] 72  (09/29 0600) Resp:  [14-23] 16  (09/29 0600) BP: (105-161)/(68-96) 105/68 mmHg (09/29 0600) SpO2:  [95 %-100 %] 96 % (09/29 1350) Weight:  [106.142 kg (234 lb)-109.09 kg (240 lb 8 oz)] 240 lb 8 oz (109.09 kg) (09/29 0600) Weight change:  Last BM Date: 05/24/11  Physical Exam: General: Alert, awake, oriented x3, in no acute distress. HEENT: No bruits, no goiter. Heart: Regular rate and rhythm, without murmurs, rubs, gallops. Lungs: Clear to auscultation bilaterally. Abdomen: Soft, nondistended, positive bowel sounds. Extremities: No clubbing cyanosis or edema with positive pedal pulses. Neuro: Grossly intact, nonfocal.    Lab Results: Basic Metabolic Panel:  Basename 05/26/11 0620 05/25/11 1800  NA 136 132*  K 4.2 4.2  CL 99 97  CO2 29 24  GLUCOSE 203* 251*  BUN 26* 26*  CREATININE 1.10 0.86  CALCIUM 9.4 9.8  MG -- --  PHOS -- --   Liver Function Tests:  [facility] 05/26/11 0620 05/25/11 2140  AST 14 16  ALT 14 16  ALKPHOS 60 69  BILITOT 0.4 0.5  PROT 6.1 6.8  ALBUMIN 3.1* 3.5    Basename 05/25/11 2140  LIPASE 13  AMYLASE --   No results found for this basename: AMMONIA:2 in the last 72 hours CBC:  Basename 05/26/11 0620 05/25/11 1800  WBC 6.1 6.2  NEUTROABS -- --  HGB 12.1* 12.9*  HCT 34.2* 35.9*  MCV 86.1 84.9  PLT 144* 153   Cardiac Enzymes:  Basename 05/26/11 0620 05/25/11 2140 05/25/11 1942  CKTOTAL 107 148 153   CKMB 4.0 4.6* 4.9*  CKMBINDEX -- -- --  TROPONINI <0.30 <0.30 <0.30   BNP:  Basename 05/25/11 2140  POCBNP 96.2   D-Dimer:  Basename 05/25/11 1800  DDIMER 0.39   CBG:  Basename 05/26/11 1617 05/26/11 1116 05/26/11 0723 05/25/11 2135 05/25/11 1814  GLUCAP 264* 236* 201* 204* 236*    Studies/Results: Dg Chest 2 View  05/25/2011  *RADIOLOGY REPORT*  Clinical Data: Chest pain  CHEST - 2 VIEW  Comparison: 03/20/2004  Findings: Lungs are clear. No pleural effusion or pneumothorax.  Cardiomediastinal silhouette is within normal limits.  Mild degenerative changes of the visualized thoracolumbar spine.  IMPRESSION: No evidence of acute cardiopulmonary disease.  Original Report Authenticated By: Julian Hy, M.D.   Ct Abdomen Pelvis W Contrast  05/26/2011  *RADIOLOGY REPORT*  Clinical Data: Abdominal pain, epigastric pain  CT ABDOMEN AND PELVIS WITH CONTRAST  Technique:  Multidetector CT imaging of the abdomen and pelvis was performed following the standard protocol during bolus administration of intravenous contrast.  Contrast: 171mL OMNIPAQUE IOHEXOL 300 MG/ML IV SOLN  Comparison: 09/07/2003  Findings: Lung bases are unremarkable.  Mild degenerative changes thoracolumbar spine.  The enhanced liver is unremarkable.  At least one calcified gallstone is noted in gallbladder neck region measures about 4.5 mm.  Spleen, pancreas and adrenal glands are unremarkable.  Kidneys are symmetrical in size and enhancement.  No hydronephrosis or hydroureter.  Oral contrast material was given to the patient.  No small bowel obstruction.  No ascites or free air.  No adenopathy.  There is no pericecal inflammation.  Normal appendix is clearly visualized in axial image 69.  Stool noted in the left colon and rectosigmoid colon.  Prostate gland and seminal vesicles are unremarkable.  Nonspecific mild thickening of urinary bladder wall.  No pelvic ascites or adenopathy.  No inguinal adenopathy.  IMPRESSION:  1.  No  acute inflammatory process within abdomen or pelvis. 2.  At least one calcified gallstone noted within gallbladder neck region measures 4.5 mm. 3.  No hydronephrosis or hydroureter. 4.  No pericecal inflammation.  Normal appendix is clearly visualized. 5.  Nonspecific mild thickening of urinary bladder wall.  Original Report Authenticated By: Lahoma Crocker, M.D.    Medications: Scheduled Meds:   . sodium chloride   Intravenous Once  . albuterol  2 puff Inhalation Q6H  . aspirin  324 mg Oral Once  . aspirin EC  81 mg Oral Daily  . gabapentin  300 mg Oral TID  . insulin aspart  0-9 Units Subcutaneous TID WC  . insulin glargine  80 Units Subcutaneous QHS  . lisinopril  20 mg Oral QHS  . nitroGLYCERIN  1 inch Topical Once  . nitroGLYCERIN  0.4 mg Sublingual Once  . pantoprazole (PROTONIX) IV  40 mg Intravenous Q12H  . sodium chloride  3 mL Intravenous Q12H  . sodium chloride      . DISCONTD: albuterol  2.5 mg Nebulization Q4H  . DISCONTD: insulin glargine  80 Units Subcutaneous QHS  . DISCONTD: pantoprazole (PROTONIX) IV  40 mg Intravenous Q24H   Continuous Infusions:  PRN Meds:.acetaminophen, acetaminophen, albuterol, iohexol, morphine, ondansetron (ZOFRAN) IV, ondansetron, oxyCODONE, senna, DISCONTD: ondansetron (ZOFRAN) IV  Assessment/Plan: 1-Chest pain- negative cardiac enzymes from, no ischemic changes on EKG, no telemetry abnormalities; patient also with recent stress test done in June 2012 which was negative. At this point we'll discontinue telemetry; will increase Protonix to twice a day and will check for gastroparesis with gastric emptying study, since patient reports worsening pain with food and associated nausea during my interview today. 2-Dyspnea on exertion- old clear etiology at this point. We'll check pulse oxymetry at night; patient with body habitus that fit fort sleep apnea. 3-DM type 2 (diabetes mellitus, type 2)-continue modified carbohydrate diet, sliding scale and  insulin. 4-HTN (hypertension)-stable continue current medications 5-Abdominal pain of unknown etiology- as mentioned on problem #1, will r/o gastroparesis and will increase protonix dose.    LOS: 1 day   Effie Janoski 05/26/2011, 5:39 PM

## 2011-05-27 LAB — GLUCOSE, CAPILLARY
Glucose-Capillary: 196 mg/dL — ABNORMAL HIGH (ref 70–99)
Glucose-Capillary: 229 mg/dL — ABNORMAL HIGH (ref 70–99)
Glucose-Capillary: 208 mg/dL — ABNORMAL HIGH (ref 70–99)

## 2011-05-27 MED ORDER — SODIUM CHLORIDE 0.9 % IJ SOLN
INTRAMUSCULAR | Status: AC
  Administered 2011-05-27: 10 mL
  Filled 2011-05-27: qty 10

## 2011-05-27 NOTE — Progress Notes (Signed)
Subjective: The patient denies any nausea vomiting or abdominal pain today. He denies any shortness of breath or chest pain today.  Objective: Vital signs in last 24 hours: Temp:  [97 F (36.1 C)-98.5 F (36.9 C)] 98.2 F (36.8 C) (09/30 0600) Pulse Rate:  [70-86] 70  (09/30 0600) Resp:  [16] 16  (09/30 0600) BP: (121-132)/(72-79) 132/79 mmHg (09/30 0600) SpO2:  [96 %-99 %] 99 % (09/30 0957) Weight change:  Last BM Date: 05/24/11  Intake/Output from previous day: 09/29 0701 - 09/30 0700 In: 1320 [P.O.:1320] Out: -  Intake/Output this shift:    General: Alert, awake, oriented x3, in no acute distress. HEENT: No bruits, no goiter. Heart: Regular rate and rhythm, without murmurs, rubs, gallops. Lungs: Clear to auscultation bilaterally. Abdomen: Soft, nontender, nondistended, positive bowel sounds. Extremities: No clubbing cyanosis or edema with positive pedal pulses. Neuro: Grossly intact, nonfocal.   Lab Results:  Basename 05/26/11 1800 05/26/11 0620  WBC 6.0 6.1  HGB 12.0* 12.1*  HCT 33.0* 34.2*  PLT 163 144*   BMET  Basename 05/26/11 1800 05/26/11 0620  NA 130* 136  K 3.9 4.2  CL 94* 99  CO2 26 29  GLUCOSE 287* 203*  BUN 23 26*  CREATININE 1.05 1.10  CALCIUM 9.2 9.4    Studies/Results: Dg Chest 2 View  05/25/2011    IMPRESSION: No evidence of acute cardiopulmonary disease.  Original Report Authenticated By: Julian Hy, M.D.   Ct Abdomen Pelvis W Contrast  05/26/2011     IMPRESSION:  1.  No acute inflammatory process within abdomen or pelvis. 2.  At least one calcified gallstone noted within gallbladder neck region measures 4.5 mm. 3.  No hydronephrosis or hydroureter. 4.  No pericecal inflammation.  Normal appendix is clearly visualized. 5.  Nonspecific mild thickening of urinary bladder wall.  Original Report Authenticated By: Lahoma Crocker, M.D.    Medications:     . albuterol  2 puff Inhalation TID  . aspirin EC  81 mg Oral Daily  . gabapentin   300 mg Oral TID  . insulin aspart  0-9 Units Subcutaneous TID WC  . insulin glargine  80 Units Subcutaneous QHS  . lisinopril  20 mg Oral QHS  . pantoprazole (PROTONIX) IV  40 mg Intravenous Q12H  . sodium chloride  3 mL Intravenous Q12H  . DISCONTD: albuterol  2 puff Inhalation Q6H  . DISCONTD: insulin glargine  80 Units Subcutaneous QHS  . DISCONTD: pantoprazole (PROTONIX) IV  40 mg Intravenous Q24H    Assessment/Plan: 1-Chest pain- negative cardiac enzymes from, no ischemic changes on EKG, no telemetry abnormalities; patient also with recent stress test done in June 2012 which was negative..Will check for gastroparesis with gastric emptying study, since patient reports worsening pain with food and associated nausea during my interview today.  2-Dyspnea on exertion- old clear etiology at this point. We'll check pulse oxymetry at night; patient with body habitus that fit fort sleep apnea. Will need an outpatient sleep study. 3-DM type 2 (diabetes mellitus, type 2)-continue modified carbohydrate diet, sliding scale and insulin.  4-HTN (hypertension)-stable continue current medications  5-Abdominal pain of unknown etiology- as mentioned on problem #1, will r/o gastroparesis and will increase protonix dose    LOS: 2 days   [facility] 05/27/2011, 10:54 AM

## 2011-05-28 ENCOUNTER — Encounter (HOSPITAL_COMMUNITY): Payer: BC Managed Care – PPO

## 2011-05-28 ENCOUNTER — Inpatient Hospital Stay (HOSPITAL_COMMUNITY): Payer: BC Managed Care – PPO

## 2011-05-28 ENCOUNTER — Encounter (HOSPITAL_COMMUNITY): Payer: Self-pay | Admitting: Adult Health

## 2011-05-28 DIAGNOSIS — K8020 Calculus of gallbladder without cholecystitis without obstruction: Secondary | ICD-10-CM | POA: Diagnosis present

## 2011-05-28 DIAGNOSIS — R079 Chest pain, unspecified: Secondary | ICD-10-CM

## 2011-05-28 DIAGNOSIS — R0602 Shortness of breath: Secondary | ICD-10-CM

## 2011-05-28 LAB — BASIC METABOLIC PANEL
GFR calc Af Amer: >90 mL/min (ref >90)
GFR calc non Af Amer: >90 mL/min (ref >90)
Glucose, Bld: 157 mg/dL — ABNORMAL HIGH (ref 70–99)
Potassium: 3.8 mEq/L (ref 3.5–5.1)
Sodium: 134 mEq/L — ABNORMAL LOW (ref 135–145)

## 2011-05-28 LAB — GLUCOSE, CAPILLARY
Glucose-Capillary: 161 mg/dL — ABNORMAL HIGH (ref 70–99)
Glucose-Capillary: 181 mg/dL — ABNORMAL HIGH (ref 70–99)

## 2011-05-28 LAB — D-DIMER, QUANTITATIVE: D-Dimer, Quant: 0.43 ug/mL-FEU (ref 0.00–0.48)

## 2011-05-28 LAB — TROPONIN I: Troponin I: <0.3 ng/mL (ref <0.30)

## 2011-05-28 MED ORDER — ALBUTEROL SULFATE HFA 108 (90 BASE) MCG/ACT IN AERS: 2.0000 | INHALATION_SPRAY | Freq: Three times a day (TID) | RESPIRATORY_TRACT | Status: DC

## 2011-05-28 MED ORDER — TECHNETIUM TC 99M SULFUR COLLOID
2.0000 | Freq: Once | INTRAVENOUS | Status: AC | PRN
  Administered 2011-05-28: 2.2 via INTRAVENOUS

## 2011-05-28 MED ORDER — PROMETHAZINE HCL 12.5 MG PO TABS: 12.5000 mg | ORAL_TABLET | Freq: Four times a day (QID) | ORAL | Status: DC | PRN

## 2011-05-28 MED ORDER — LISINOPRIL 10 MG PO TABS
20.0000 mg | ORAL_TABLET | Freq: Two times a day (BID) | ORAL | Status: DC
  Administered 2011-05-28 – 2011-05-30 (×4): 20 mg via ORAL
  Filled 2011-05-28 (×4): qty 2

## 2011-05-28 MED ORDER — ALBUTEROL SULFATE (5 MG/ML) 0.5% IN NEBU
2.5000 mg | INHALATION_SOLUTION | Freq: Four times a day (QID) | RESPIRATORY_TRACT | Status: DC | PRN
  Filled 2011-05-28: qty 0.5

## 2011-05-28 NOTE — Consult Note (Signed)
CARDIOLOGY CONSULT NOTE  Patient ID: Eugene [name] MRN: [id number] DOB/AGE: 11/29/1955 55 y.o.  Admit date: 05/25/2011 Referring Physician THR Primary Physician: Elpidio Eric, [place] Primary Cardiologist(new) [place] Reason for Consultation: Chest Pain and Dyspnea  HPI: 55 y/o overweight male patient admitted to [facility] with complaints of chest pain, radiating into the neck with shortness of breath and visual disturbance on exertion for the past few months. Exercise tolerance has been extremely poor, but he could do anything he wanted to physically prior to the onset of these symptoms. He has not noted wheezing and denies orthopnea and PND. There is no prior pulmonary history, and he was recently evaluated by Dr. Luan Pulling. PFTs have been performed, but the results are not available. ABG showed mild to moderate resting hypoxemia. He has a history of snoring, but has not had sleep apnea as far as he knows. He was admitted to the hospital in [facility] June 2012, had a stress test and echocardiogram and was told the tests were negative.  He states he had a cardiac catheterization 10 years ago at [facility], and was told he had no blockages.  He states that he knows something is wrong with his heart, as he has had progressive DOE, unable to walk to mailbox without having significant chest pressure and dyspnea.  He states that he is also experiencing numbness and tingling in his left arm. Sometimes near syncope with DOE.  ABG performed in conjunction with recent PFTs: pH 7.36, pCO2 : 45.0; pO2 59.5; TCO2: 22.1 on Room Air.  Cardiac enzymes are negative X 1. He is appropriately concerned about his symptoms, suffered through the death of his first wife at age 62 is the result of her nares artery disease and seeks a diagnosis prior to hospital discharge.  Records have been requested from [facility].  Review of systems complete and found to be negative unless listed above  Past Medical History    Diagnosis Date  . Hypertension   . Hypercholesteremia   . Diabetes mellitus   . Bell palsy   . Kidney stone   . Neuropathy associated with endocrine disorder     Family History  Problem Relation Age of Onset  . Diabetes Mother   . Heart attack Mother   . Stroke Mother   . Diabetes Sister   . Sleep apnea Sister   . Hypertension Brother   . Diabetes Brother     History   Social History  . Marital Status: Married    Spouse Name: N/A    Number of Children: N/A  . Years of Education: N/A   Occupational History  . Shipping     [employer]   Social History Main Topics  . Smoking status: Never Smoker   . Smokeless tobacco: Not on file  . Alcohol Use: No  . Drug Use: No  . Sexually Active: Yes    Birth Control/ Protection: None   Other Topics Concern  . 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 Circumcision      Prescriptions prior to admission  Medication Sig Dispense Refill  . gabapentin (NEURONTIN) 300 MG capsule Take 300 mg by mouth 3 (three) times daily.        . insulin glargine (LANTUS SOLOSTAR) 100 UNIT/ML injection Inject 80 Units into the skin at bedtime.        [name] lisinopril (  PRINIVIL,ZESTRIL) 20 MG tablet Take 20 mg by mouth at bedtime.        . metFORMIN (GLUCOPHAGE) 500 MG tablet Take 500 mg by mouth 2 (two) times daily with a meal.        . DISCONTD: UNKNOWN TO PATIENT Inhale 1 puff into the lungs 2 (two) times daily. HFA Given by Physician: NAME UNKNOWN         Physical Exam: Blood pressure 165/97, pulse 91, temperature 98.3 F (36.8 C), temperature source Oral, resp. rate 18, height 6\' 1"  (1.854 m), weight 242 lb 6.4 oz (109.952 kg), SpO2 97.00%. Body mass index is 31.98 kg/(m^2). General-Well-developed; no acute distress Body Habitus-overweight HEENT-[place]/AT; PERRL; EOM intact; conjunctiva and lids nl Neck-No JVD; no carotid bruits Endocrine-No  thyromegaly Lungs-mild expiratory rhonchi; prolonged expiratory phase; resonant percussion Cardiovascular- normal PMI; normal S1 and S2 Abdomen-BS normal; soft and non-tender without masses or organomegaly Musculoskeletal-No deformities, cyanosis or clubbing Neurologic-Nl cranial nerves; symmetric strength and tone Skin- Warm, no significant lesions Extremities-Nl distal pulses; no edema    Lab Results  Component Value Date   WBC 6.0 05/26/2011   HGB 12.0* 05/26/2011   HCT 33.0* 05/26/2011   MCV 85.5 05/26/2011   PLT 163 05/26/2011     Lab 05/28/11 0529 05/26/11 0620  NA 134* --  K 3.8 --  CL 99 --  CO2 29 --  BUN 19 --  CREATININE 0.88 --  CALCIUM 9.1 --  PROT -- 6.1  BILITOT -- 0.4  ALKPHOS -- 60  ALT -- 14  AST -- 14  GLUCOSE 157* --   Lab Results  Component Value Date   CKTOTAL 107 05/26/2011   CKMB 4.0 05/26/2011   TROPONINI <0.30 05/28/2011    Radiology:Gastric Emptying (05/28/2011) IMPRESSION: Abnormal exam demonstrating no evidence of gastric emptying at 2 hours following ingestion of the standardized meal. This can be seen with diabetic gastroparesis and with gastric outlet obstruction.  CT Abdomen/Pelvis (05/26/2011) IMPRESSION:  1. No acute inflammatory process within abdomen or pelvis. 2. At least one calcified gallstone noted within gallbladder neck    region measures 4.5 mm. 3. No hydronephrosis or hydroureter. 4. No pericecal inflammation. Normal appendix is clearly    visualized. 5. Nonspecific mild thickening of urinary bladder wall.  Chest X-Ray; IMPRESSION: No evidence of acute cardiopulmonary disease.  EKG:NSR with Incomplete RBBB rate of 87 bpm  ASSESSMENT AND PLAN:   1. Chest Pain: Typical and atypical features; most prominent symptom is dyspnea on exertion.  Not sure that we need to proceed with cardiac catheterization in this setting with normal cardiac enzymes.   2. R/O Sleep Apnea: He is easily short of breath while laying in the bed  speaking with me. He has not been tested for this. Would have overnight oxygen saturation monitor placed on him.  PFT results are being sought.  3. Diabetes: Has gastroparesis with nuclear emptying.  This may be contributing to his symptoms, and reflux could cause bronchospasm.   Treatment with a PPI +/- Reglan can be considered.  4. Hypertension: Blood pressure is labile. From [id number] systolic to 99991111 systolic.  He is on ACE-inhibitor.  Antihypertensive therapy may require adjustment.  Signed: Phill Myron. Purcell Nails NP [facility] 05/28/2011, 4:26 PM  ---------------------------------------------------- Cardiology Attending  Patient interviewed and examined. Discussed with Jory Sims, NP.  Above note annotated and modified based upon my findings.  Dyspnea and decline in exercise tolerance are impressive, and the possibility of serious underlying pulmonary, or less  likely cardiac, pathology must be considered and excluded.   BNP level is normal, but a d-dimer test is pending.  Alpha-1 antitrypsin level will also be performed.   A CT scan of the chest will be required, at least to look at the lung parenchyma even if d-dimer is normal. That study will be ordered. Records from [facility] will be reviewed when available. Although severe three-vessel coronary disease could account for his symptoms, development of that entity over 10 years is relatively unlikely. Cardiac catheterization may prove necessary if no other etiology for his symptoms is identified.  Jacqulyn Ducking, MD

## 2011-05-28 NOTE — Procedures (Signed)
NAME:  Eugene [name], Eugene [name]           ACCOUNT NO.:  [id number]  MEDICAL RECORD NO.:  [id number]  LOCATION:  A326                          FACILITY:  [facility]  PHYSICIAN:  Teasia Zapf L. Luan Pulling, M.D.DATE OF BIRTH:  31-Jan-1956  DATE OF PROCEDURE: DATE OF DISCHARGE:                           PULMONARY FUNCTION TEST   Pulmonary function testing is done for shortness of breath. 1. Spirometry is normal. 2. Lung volumes show reduction in total lung capacity to 67% of     predicted. 3. DLCO is normal. 4. Arterial blood gas shows resting hypoxia. 5. Noting the patient's height and weight some of the problem with     reduction in total lung capacity may be related to body habitus.     [name] L. Luan Pulling, M.D.     ELH/MEDQ  D:  05/28/2011  T:  05/28/2011  Job:  [id number]

## 2011-05-28 NOTE — Discharge Summary (Addendum)
Physician Discharge Summary  Eugene [name] MRN: [id number] DOB/AGE: 03/24/1956 55 y.o.  PCP: Primary care provider is Dr. Christen Bame in [place], [place]  . Primary pulmonologist is Dr. Sinda Du.   Admit date: 05/25/2011 Discharge date: 05/28/2011  Discharge Diagnoses:   Principal Problem:  Chest pain: Noncardiac Active Problems:  Dyspnea on exertion: The hypoxia noted on ABG  DM type 2 (diabetes mellitus, type 2)  HTN (hypertension)  Abdominal pain of unknown etiology   Current Discharge Medication List    START taking these medications   Details  albuterol (PROVENTIL HFA;VENTOLIN HFA) 108 (90 BASE) MCG/ACT inhaler Inhale 2 puffs into the lungs 3 (three) times daily. Qty: 1 Inhaler, Refills: 1    promethazine (PHENERGAN) 12.5 MG tablet Take 1 tablet (12.5 mg total) by mouth every 6 (six) hours as needed for nausea. Qty: 30 tablet, Refills: 0      CONTINUE these medications which have NOT CHANGED   Details  gabapentin (NEURONTIN) 300 MG capsule Take 300 mg by mouth 3 (three) times daily.      insulin glargine (LANTUS SOLOSTAR) 100 UNIT/ML injection Inject 80 Units into the skin at bedtime.      lisinopril (PRINIVIL,ZESTRIL) 20 MG tablet Take 20 mg by mouth at bedtime.      metFORMIN (GLUCOPHAGE) 500 MG tablet Take 500 mg by mouth 2 (two) times daily with a meal.        STOP taking these medications     UNKNOWN TO PATIENT         Discharge Condition: Stable  Disposition:    Consults: Dr. Sinda Du   Significant Diagnostic Studies: Dg Chest 2 View  05/25/2011  *RADIOLOGY REPORT*  IMPRESSION: No evidence of acute cardiopulmonary disease.  Original Report Authenticated By: Julian Hy, M.D.   Ct Abdomen Pelvis W Contrast  05/26/2011  *RADIOLOGY REPORT*  Clinical Data: Abdominal pain, epigastric pain  CT ABDOMEN AND PELVIS WITH CONTRAST  Technique:   IMPRESSION:  1.  No acute inflammatory process within abdomen or pelvis. 2.  At  least one calcified gallstone noted within gallbladder neck region measures 4.5 mm. 3.  No hydronephrosis or hydroureter. 4.  No pericecal inflammation.  Normal appendix is clearly visualized. 5.  Nonspecific mild thickening of urinary bladder wall.  Original Report Authenticated By: Lahoma Crocker, M.D.   ECHO CARDIAC CATH and [place]  Microbiology: No results found for this or any previous visit (from the past 240 hour(s)).   Labs: Results for orders placed during the hospital encounter of 05/25/11 (from the past 48 hour(s))  GLUCOSE, CAPILLARY     Status: Abnormal   Collection Time   05/26/11 11:16 AM      Component Value Range Comment   Glucose-Capillary 236 (*) 70 - 99 (mg/dL)    Comment 1 Notify RN      Comment 2 Documented in Chart     GLUCOSE, CAPILLARY     Status: Abnormal   Collection Time   05/26/11  4:17 PM      Component Value Range Comment   Glucose-Capillary 264 (*) 70 - 99 (mg/dL)    Comment 1 Notify RN      Comment 2 Documented in Chart     CBC     Status: Abnormal   Collection Time   05/26/11  6:00 PM      Component Value Range Comment   WBC 6.0  4.0 - 10.5 (K/uL)    RBC 3.86 (*) 4.22 - 5.81 (  MIL/uL)    Hemoglobin 12.0 (*) 13.0 - 17.0 (g/dL)    HCT 33.0 (*) 39.0 - 52.0 (%)    MCV 85.5  78.0 - 100.0 (fL)    MCH 31.1  26.0 - 34.0 (pg)    MCHC 36.4 (*) 30.0 - 36.0 (g/dL)    RDW 12.9  11.5 - 15.5 (%)    Platelets 163  150 - 400 (K/uL)   BASIC METABOLIC PANEL     Status: Abnormal   Collection Time   05/26/11  6:00 PM      Component Value Range Comment   Sodium 130 (*) 135 - 145 (mEq/L)    Potassium 3.9  3.5 - 5.1 (mEq/L)    Chloride 94 (*) 96 - 112 (mEq/L)    CO2 26  19 - 32 (mEq/L)    Glucose, Bld 287 (*) 70 - 99 (mg/dL)    BUN 23  6 - 23 (mg/dL)    Creatinine, Ser 1.05  0.50 - 1.35 (mg/dL)    Calcium 9.2  8.4 - 10.5 (mg/dL)    GFR calc non Af Amer >60  >60 (mL/min)    GFR calc Af Amer >60  >60 (mL/min)   GLUCOSE, CAPILLARY     Status: Abnormal    Collection Time   05/27/11  7:40 AM      Component Value Range Comment   Glucose-Capillary 164 (*) 70 - 99 (mg/dL)    Comment 1 Documented in Chart      Comment 2 Notify RN     GLUCOSE, CAPILLARY     Status: Abnormal   Collection Time   05/27/11 11:43 AM      Component Value Range Comment   Glucose-Capillary 196 (*) 70 - 99 (mg/dL)    Comment 1 Documented in Chart      Comment 2 Notify RN     GLUCOSE, CAPILLARY     Status: Abnormal   Collection Time   05/27/11  4:35 PM      Component Value Range Comment   Glucose-Capillary 229 (*) 70 - 99 (mg/dL)   GLUCOSE, CAPILLARY     Status: Abnormal   Collection Time   05/27/11 11:16 PM      Component Value Range Comment   Glucose-Capillary 208 (*) 70 - 99 (mg/dL)   BASIC METABOLIC PANEL     Status: Abnormal   Collection Time   05/28/11  5:29 AM      Component Value Range Comment   Sodium 134 (*) 135 - 145 (mEq/L)    Potassium 3.8  3.5 - 5.1 (mEq/L)    Chloride 99  96 - 112 (mEq/L)    CO2 29  19 - 32 (mEq/L)    Glucose, Bld 157 (*) 70 - 99 (mg/dL)    BUN 19  6 - 23 (mg/dL)    Creatinine, Ser 0.88  0.50 - 1.35 (mg/dL)    Calcium 9.1  8.4 - 10.5 (mg/dL)    GFR calc non Af Amer >90  >90 (mL/min)    GFR calc Af Amer >90  >90 (mL/min)   GLUCOSE, CAPILLARY     Status: Abnormal   Collection Time   05/28/11  7:26 AM      Component Value Range Comment   Glucose-Capillary 161 (*) 70 - 99 (mg/dL)      HPI :Chief Complaint: Chest pain  HPI: This is a 55 year old, Caucasian male, who presented to the ED today with complaints of left-sided chest pain, and arm pain. He  has a history of, diabetes, hypertension. He tells me that till about 3 months ago he was fairly active and then he started noticing that he was getting short of breath with exertion. He went to see his doctor. It looks like he had the quite a bit of workup in the form of a stress test and echocardiogram and carotid Dopplers. So far now significant abnormalities have been found.  Subsequently, he was referred to a pulmonologist and underwent a Pulmonary function test this past week. He does not have the results of the same.   On the day of admission at around 4 or 4:30 he was sitting and started experiencing some neck pain followed by left arm pain. And, then he also started having this sharp, left-sided chest pain. It was 2-3/10 in intensity. He decided to come in to the ED. Denies any nausea, vomiting, did not have any palpitations. Denies any leg swelling. This was associated with some shortness of breath as well. He's had a cough for the last 3 months, which is a dry cough. He did feel lightheaded today and has been feeling lightheaded for the last 3 months with exertion. He also complained of  some pain in his left upper abdomen upon admission. Denied any blood in the stools or black colored stools. He denied smoking cigarettes. No precipitating, aggravating or relieving factors for his chest pain.   HOSPITAL COURSE:  #1 chest pain. The patient's cardiac enzymes were cycled and were found to be negative, he did not have any ischemic changes on his EKG, his telemetry was uneventful, he had a recent negative stress test in June of 2012 which was negative. His quantitative d-dimer was also found to be negative. His pro BNP was 96.2. #2 shortness of breath. This appears to be chronic likely secondary to obesity hypoventilation syndrome. The patient would need close outpatient followup in Dr. Luan Pulling. Patient had pulmonary function testing done that showed normal spirometry, lung volumes are reduced in total lung capacity to 67% of predicted, DLCO was normal, ABG showed resting hypoxia.  #3 diabetes. Hemoglobin A1c was 10.0, patient was continued on Lantus and sliding scale insulin, his metformin was held for about 48 hours, he will resume this upon discharge, weight loss is strongly recommended, #4 intractable nausea and vomiting. CT scan of the abdomen and pelvis showed no acute  inflammatory process, one calcified gallstone within the gallbladder neck region measuring 4.5 mm, no hydronephrosis or hydroureter was seen, appendix appeared to be normal, there is nonspecific mild thickening of the urinary bladder wall. The patient also had a gastric emptying study scheduled for today, the results of which are pending at this time .     Discharge Exam: General: Alert, awake, oriented x3, in no acute distress. HEENT: No bruits, no goiter. Heart: Regular rate and rhythm, without murmurs, rubs, gallops. Lungs: Clear to auscultation bilaterally. Abdomen: Soft, nontender, nondistended, positive bowel sounds. Extremities: No clubbing cyanosis or edema with positive pedal pulses. Neuro: Grossly intact, nonfocal.   Blood pressure 134/82, pulse 72, temperature 97.7 F (36.5 C), temperature source Oral, resp. rate 18, height 6\' 1"  (1.854 m), weight 109.952 kg (242 lb 6.4 oz), SpO2 100.00%.     Discharge Orders    Future Orders Please Complete By Expires   Diet - low sodium heart healthy      Scheduling Instructions:   1800 ada diet   Increase activity slowly      Call MD for:  persistant nausea and vomiting  Call MD for:  severe uncontrolled pain      Call MD for:  persistant dizziness or light-headedness        Please note: The patient is due to have a cardiac catheterization in 2 days time for investigation of his chest pain. He is now ready to  be discharged today.   SignedReyne Dumas 05/30/2011, 10:01 AM

## 2011-05-28 NOTE — Progress Notes (Signed)
Patient states that about 3-4 months ago he was eating potato soup with jalapeno.  States that during the night, he aspirated quite a bit and felt like something went "down the wrong pipe".  He has had trouble since then.  I just wonder if aspiration could be considered??

## 2011-05-29 ENCOUNTER — Inpatient Hospital Stay (HOSPITAL_COMMUNITY): Payer: BC Managed Care – PPO

## 2011-05-29 ENCOUNTER — Other Ambulatory Visit: Payer: Self-pay | Admitting: Cardiovascular Disease

## 2011-05-29 DIAGNOSIS — I369 Nonrheumatic tricuspid valve disorder, unspecified: Secondary | ICD-10-CM

## 2011-05-29 LAB — BASIC METABOLIC PANEL
BUN: 18 mg/dL (ref 6–23)
Calcium: 9.1 mg/dL (ref 8.4–10.5)
Chloride: 99 mEq/L (ref 96–112)
Creatinine, Ser: 0.96 mg/dL (ref 0.50–1.35)
GFR calc Af Amer: >90 mL/min (ref >90)
GFR calc non Af Amer: >90 mL/min (ref >90)

## 2011-05-29 LAB — GLUCOSE, CAPILLARY
Glucose-Capillary: 219 mg/dL — ABNORMAL HIGH (ref 70–99)
Glucose-Capillary: 191 mg/dL — ABNORMAL HIGH (ref 70–99)
Glucose-Capillary: 268 mg/dL — ABNORMAL HIGH (ref 70–99)

## 2011-05-29 LAB — TSH: TSH: 1.065 u[IU]/mL (ref 0.350–4.500)

## 2011-05-29 LAB — HEMOGLOBIN A1C
Hgb A1c MFr Bld: 9.7 % — ABNORMAL HIGH (ref <5.7)
Mean Plasma Glucose: 232 mg/dL — ABNORMAL HIGH (ref <117)

## 2011-05-29 MED ORDER — PANTOPRAZOLE SODIUM 40 MG PO TBEC
40.0000 mg | DELAYED_RELEASE_TABLET | Freq: Two times a day (BID) | ORAL | Status: DC
  Administered 2011-05-29 – 2011-05-30 (×2): 40 mg via ORAL
  Filled 2011-05-29 (×2): qty 1

## 2011-05-29 MED ORDER — IOHEXOL 350 MG/ML SOLN
100.0000 mL | Freq: Once | INTRAVENOUS | Status: AC | PRN
  Administered 2011-05-29: 100 mL via INTRAVENOUS

## 2011-05-29 MED ORDER — METOCLOPRAMIDE HCL 5 MG/5ML PO SOLN
10.0000 mg | Freq: Three times a day (TID) | ORAL | Status: DC
  Administered 2011-05-29 – 2011-05-30 (×4): 10 mg via ORAL
  Filled 2011-05-29 (×13): qty 10

## 2011-05-29 NOTE — Consult Note (Signed)
CSW received referral for medication needs. CM notified and aware.   Salome Arnt

## 2011-05-29 NOTE — Progress Notes (Signed)
*  PRELIMINARY RESULTS* Echocardiogram 2D Echocardiogram has been performed.  Tera Partridge 05/29/2011, 10:00 AM

## 2011-05-29 NOTE — Progress Notes (Signed)
SUBJECTIVE: No complaints this am.  Just back from CT  No SSCP palpitations or edema  Filed Vitals:   05/28/11 1347 05/28/11 1407 05/29/11 0522 05/29/11 0724  BP: 165/97  144/94   Pulse: 91  65   Temp: 98.3 F (36.8 C)  97.9 F (36.6 C)   TempSrc: Oral  Oral   Resp: 18  22   Height:      Weight:      SpO2: 100% 97% 100% 100%    Intake/Output Summary (Last 24 hours) at 05/29/11 0842 Last data filed at 05/29/11 0522  Gross per 24 hour  Intake    240 ml  Output      0 ml  Net    240 ml    LABS: Basic Metabolic Panel:  Basename 05/29/11 0509 05/28/11 0529  NA 136 134*  K 4.2 3.8  CL 99 99  CO2 30 29  GLUCOSE 173* 157*  BUN 18 19  CREATININE 0.96 0.88  CALCIUM 9.1 9.1  MG -- --  PHOS -- --   Liver Function Tests: No results found for this basename: AST:2,ALT:2,ALKPHOS:2,BILITOT:2,PROT:2,ALBUMIN:2 in the last 72 hours No results found for this basename: LIPASE:2,AMYLASE:2 in the last 72 hours CBC:  Basename 05/26/11 1800  WBC 6.0  NEUTROABS --  HGB 12.0*  HCT 33.0*  MCV 85.5  PLT 163   Cardiac Enzymes:  Basename 05/28/11 1403  CKTOTAL --  CKMB --  CKMBINDEX --  TROPONINI <0.30   BNP: No results found for this basename: POCBNP:3 in the last 72 hours D-Dimer:  Basename 05/28/11 1752  DDIMER 0.43   Hemoglobin A1C:  Basename 05/28/11 1752  HGBA1C 9.7*   Fasting Lipid Panel: No results found for this basename: CHOL,HDL,LDLCALC,TRIG,CHOLHDL,LDLDIRECT in the last 72 hours Thyroid Function Tests:  Basename 05/28/11 1752  TSH 1.065  T4TOTAL --  T3FREE --  THYROIDAB --    RADIOLOGY: Dg Chest 2 View  05/25/2011  *RADIOLOGY REPORT*  Clinical Data: Chest pain  CHEST - 2 VIEW  Comparison: 03/20/2004  Findings: Lungs are clear. No pleural effusion or pneumothorax.  Cardiomediastinal silhouette is within normal limits.  Mild degenerative changes of the visualized thoracolumbar spine.  IMPRESSION: No evidence of acute cardiopulmonary disease.  Original  Report Authenticated By: Julian Hy, M.D.   Nm Gastric Emptying  05/28/2011  *RADIOLOGY REPORT*  Clinical data: Abdominal pain and nausea, particularly after food intake, history of diabetes and neuropathy  NUCLEAR MEDICINE GASTRIC EMPTYING EXAM:  IMPRESSION: Abnormal exam demonstrating no evidence of gastric emptying at 2 hours following ingestion of the standardized meal. This can be seen with diabetic gastroparesis and with gastric outlet obstruction.  Original Report Authenticated By: Burnetta Sabin, M.D.   Ct Abdomen Pelvis W Contrast  05/26/2011  *RADIOLOGY REPORT*  Clinical Data: Abdominal pain, epigastric pain  CT ABDOMEN AND PELVIS WITH CONTRAST  IMPRESSION:  1.  No acute inflammatory process within abdomen or pelvis. 2.  At least one calcified gallstone noted within gallbladder neck region measures 4.5 mm. 3.  No hydronephrosis or hydroureter. 4.  No pericecal inflammation.  Normal appendix is clearly visualized. 5.  Nonspecific mild thickening of urinary bladder wall.  Original Report Authenticated By: Lahoma Crocker, M.D.    PHYSICAL EXAM General: Well developed, well nourished, in no acute distress Head: Eyes PERRLA, No xanthomas.   Normal cephalic and atramatic  Lungs: Clear bilaterally to auscultation and percussion. Heart: HRRR S1 S2, with soft S4 murmur.  Pulses are 2+ & equal.  No carotid bruit. No JVD.  No abdominal bruits. No femoral bruits. Abdomen: Bowel sounds are positive, abdomen soft and non-tender without masses or                  Hernia's noted. Msk:  Back normal, normal gait. Normal strength and tone for age. Extremities: No clubbing, cyanosis or edema.  DP +1 Neuro: Alert and oriented X 3. Psych:  Good affect, responds appropriately  TELEMETRY: Reviewed telemetry pt in  NSR   ASSESSMENT AND PLAN: Reviewed CT scan from this am.  No PE, or interstitial disease.  Mild basilar atelectasis.  Dyspnea disproportionate to physical findings and more issues  with lungs and likely sleep apnea.  RR to see in am decide on cath.  I think best test would be outpatient cardiopulmonary stress test to see if exercise limited by gas exchange or CO.  Principal Problem:  *Chest pain Active Problems:  Dyspnea on exertion  DM type 2 (diabetes mellitus, type 2)  HTN (hypertension)  Abdominal pain of unknown etiology  Cholelithiasis    Phill Myron. Purcell Nails NP [place]

## 2011-05-29 NOTE — Progress Notes (Signed)
SUBJECTIVE:  No complaints this am. Just back from CT No SSCP palpitations or edema  Filed Vitals:    05/28/11 1347  05/28/11 1407  05/29/11 0522  05/29/11 0724   BP:  165/97   144/94    Pulse:  91   65    Temp:  98.3 F (36.8 C)   97.9 F (36.6 C)    TempSrc:  Oral   Oral    Resp:  18   22    Height:       Weight:       SpO2:  100%  97%  100%  100%     Intake/Output Summary (Last 24 hours) at 05/29/11 0842 Last data filed at 05/29/11 0522   Gross per 24 hour   Intake  240 ml   Output  0 ml   Net  240 ml    LABS:  Basic Metabolic Panel:   Basename  05/29/11 0509  05/28/11 0529   NA  136  134*   K  4.2  3.8   CL  99  99   CO2  30  29   GLUCOSE  173*  157*   BUN  18  19   CREATININE  0.96  0.88   CALCIUM  9.1  9.1   MG  --  --   PHOS  --  --    Liver Function Tests:  No results found for this basename: AST:2,ALT:2,ALKPHOS:2,BILITOT:2,PROT:2,ALBUMIN:2 in the last 72 hours  No results found for this basename: LIPASE:2,AMYLASE:2 in the last 72 hours  CBC:   Basename  05/26/11 1800   WBC  6.0   NEUTROABS  --   HGB  12.0*   HCT  33.0*   MCV  85.5   PLT  163    Cardiac Enzymes:   Basename  05/28/11 1403   CKTOTAL  --   CKMB  --   CKMBINDEX  --   TROPONINI  <0.30    BNP:  No results found for this basename: POCBNP:3 in the last 72 hours  D-Dimer:   Basename  05/28/11 1752   DDIMER  0.43    Hemoglobin A1C:   Basename  05/28/11 1752   HGBA1C  9.7*    Fasting Lipid Panel:  No results found for this basename: CHOL,HDL,LDLCALC,TRIG,CHOLHDL,LDLDIRECT in the last 72 hours  Thyroid Function Tests:   Basename  05/28/11 1752   TSH  1.065   T4TOTAL  --   T3FREE  --   THYROIDAB  --    RADIOLOGY:  Dg Chest 2 View  05/25/2011 *RADIOLOGY REPORT* Clinical Data: Chest pain CHEST - 2 VIEW Comparison: 03/20/2004 Findings: Lungs are clear. No pleural effusion or pneumothorax. Cardiomediastinal silhouette is within normal limits. Mild degenerative changes of the  visualized thoracolumbar spine. IMPRESSION: No evidence of acute cardiopulmonary disease. Original Report Authenticated By: Julian Hy, M.D.  Nm Gastric Emptying  05/28/2011 *RADIOLOGY REPORT*  IMPRESSION: Abnormal exam demonstrating no evidence of gastric emptying at 2 hours following ingestion of the standardized meal. This can be seen with diabetic gastroparesis and with gastric outlet obstruction. Original Report Authenticated By: Burnetta Sabin, M.D.  Ct Abdomen Pelvis W Contrast  05/26/2011 *RADIOLOGY REPORT* Clinical Data: Abdominal pain, epigastric pain CT ABDOMEN AND PELVIS WITH CONTRAST IMPRESSION: 1. No acute inflammatory process within abdomen or pelvis. 2. At least one calcified gallstone noted within gallbladder neck region measures 4.5 mm. 3. No hydronephrosis or hydroureter. 4. No pericecal inflammation. Normal appendix is clearly  visualized. 5. Nonspecific mild thickening of urinary bladder wall. Original Report Authenticated By: Lahoma Crocker, M.D.   PHYSICAL EXAM  General: Well developed, well nourished, in no acute distress  Head: Eyes PERRLA, No xanthomas. Normal cephalic and atramatic  Lungs: Clear bilaterally to auscultation and percussion.  Heart: HRRR S1 S2, with soft S4 murmur. Pulses are 2+ & equal.  No carotid bruit. No JVD. No abdominal bruits. No femoral bruits.  Abdomen: Bowel sounds are positive, abdomen soft and non-tender without masses or  Hernia's noted.  Msk: Back normal, normal gait. Normal strength and tone for age.  Extremities: No clubbing, cyanosis or edema. DP +1  Neuro: Alert and oriented X 3.  Psych: Good affect, responds appropriately   TELEMETRY: Reviewed telemetry pt in NSR   ASSESSMENT AND PLAN:  Discharge yesterday the patient requested a cardiology consult before leaving the hospital. Reviewed CT scan from this am. No PE, or interstitial disease. Mild basilar atelectasis. Dyspnea disproportionate to physical findings and more issues with lungs  and likely sleep apnea. RR to see in am decide on cath. Per cardiology, best test would be outpatient cardiopulmonary stress test to see if exercise limited by gas exchange or CO.   Assessment/Plan:  1-Chest pain- negative cardiac enzymes from, no ischemic changes on EKG, no telemetry abnormalities; patient also with recent stress test done in June 2012 which was negative. 2-D echo pending at this time ,cardiology did decided the patient needs a cardia catheter in the morning   2-Dyspnea on exertion- old clear etiology at this point. We'll check pulse oxymetry at night; patient with body habitus that fit fort sleep apnea. Will need an outpatient sleep study.  3-DM type 2 (diabetes mellitus, type 2)-continue modified carbohydrate diet, sliding scale and insulin.  4-HTN (hypertension)-stable continue current medications  5-Abdominal pain of unknown etiology- as mentioned on problem #1,  gastroparesis on gastric emptying study, will start Reglan 10 mg by mouth 3 times a day, and will increase protonix dose  Disposition. Anticipate discharge tomorrow if okay with cardiology

## 2011-05-29 NOTE — Progress Notes (Signed)
Provided educational handout for gastroparesis.  Patient asked questions appropriately.  Verbalized understanding.  Will continue to reinforce education.  Schonewitz, Eulis Canner 05/29/2011 1100

## 2011-05-30 ENCOUNTER — Telehealth: Payer: Self-pay | Admitting: Adult Health

## 2011-05-30 LAB — BASIC METABOLIC PANEL
CO2: 28 mEq/L (ref 19–32)
Calcium: 9 mg/dL (ref 8.4–10.5)
Creatinine, Ser: 0.9 mg/dL (ref 0.50–1.35)
GFR calc non Af Amer: >90 mL/min (ref >90)
Sodium: 135 mEq/L (ref 135–145)

## 2011-05-30 LAB — GLUCOSE, CAPILLARY

## 2011-05-30 LAB — ALPHA-1-ANTITRYPSIN: A-1 Antitrypsin, Ser: 128 mg/dL (ref 90–200)

## 2011-05-30 NOTE — Progress Notes (Signed)
Pt discharged home via family; Pt and family given and explained all discharge instructions, carenotes, and prescriptions; pt and family stated understanding and denied questions/concerns; all f/u appointments in place; IV removed without complicaitons; pt stable at time of discharge  

## 2011-05-30 NOTE — Progress Notes (Signed)
Inpatient Diabetes Program Recommendations  AACE/ADA: New Consensus Statement on Inpatient Glycemic Control (2009)  Target Ranges:  Prepandial:   less than 140 mg/dL      Peak postprandial:   less than 180 mg/dL (1-2 hours)      Critically ill patients:  140 - 180 mg/dL   Reason for Visit: Elevated glucose:  249 mg/dL  Inpatient Diabetes Program Recommendations Insulin - Basal: Increase Lantus to 90 units qhs Insulin - Meal Coverage: Add Novolog 4 units TID for meal coverage while in hospital. Oral Agents: Consider adding sulfonylurea and/or DDP4 inhibitor at discharge  Note: Due to elevated HbgA1c, recommend follow up with PCP to adjust Diabetes medications or consult Dr. Dorris Fetch.

## 2011-05-30 NOTE — Telephone Encounter (Signed)
[facility] only received the 1st 7 pages from the notes faxed regarding this patient's cath scheduled. Please refax to [phone]

## 2011-06-01 ENCOUNTER — Ambulatory Visit (HOSPITAL_COMMUNITY)
Admission: RE | Admit: 2011-06-01 | Discharge: 2011-06-02 | Disposition: A | Discharge status: Home / Self Care | Payer: BC Managed Care – PPO | Source: Ambulatory Visit | Attending: Cardiovascular Disease | Admitting: Cardiovascular Disease

## 2011-06-01 DIAGNOSIS — R0989 Other specified symptoms and signs involving the circulatory and respiratory systems: Secondary | ICD-10-CM | POA: Insufficient documentation

## 2011-06-01 DIAGNOSIS — I2510 Atherosclerotic heart disease of native coronary artery without angina pectoris: Secondary | ICD-10-CM | POA: Insufficient documentation

## 2011-06-01 DIAGNOSIS — E785 Hyperlipidemia, unspecified: Secondary | ICD-10-CM | POA: Insufficient documentation

## 2011-06-01 DIAGNOSIS — R0609 Other forms of dyspnea: Secondary | ICD-10-CM | POA: Insufficient documentation

## 2011-06-01 DIAGNOSIS — Z01810 Encounter for preprocedural cardiovascular examination: Secondary | ICD-10-CM | POA: Insufficient documentation

## 2011-06-01 DIAGNOSIS — I10 Essential (primary) hypertension: Secondary | ICD-10-CM | POA: Insufficient documentation

## 2011-06-01 DIAGNOSIS — IMO0001 Reserved for inherently not codable concepts without codable children: Secondary | ICD-10-CM | POA: Insufficient documentation

## 2011-06-01 DIAGNOSIS — R0602 Shortness of breath: Secondary | ICD-10-CM | POA: Insufficient documentation

## 2011-06-01 DIAGNOSIS — J9610 Chronic respiratory failure, unspecified whether with hypoxia or hypercapnia: Secondary | ICD-10-CM | POA: Insufficient documentation

## 2011-06-01 DIAGNOSIS — Z23 Encounter for immunization: Secondary | ICD-10-CM | POA: Insufficient documentation

## 2011-06-01 LAB — POCT I-STAT 3, ART BLOOD GAS (G3+)
Bicarbonate: 26.3 mEq/L — ABNORMAL HIGH (ref 20.0–24.0)
O2 Saturation: 88 %
TCO2: 28 mmol/L (ref 0–100)
pCO2 arterial: 44.3 mmHg (ref 35.0–45.0)
pH, Arterial: 7.381 (ref 7.350–7.450)
pO2, Arterial: 56 mmHg — ABNORMAL LOW (ref 80.0–100.0)

## 2011-06-01 LAB — POCT I-STAT 3, VENOUS BLOOD GAS (G3P V)
Bicarbonate: 26.2 mEq/L — ABNORMAL HIGH (ref 20.0–24.0)
O2 Saturation: 55 %
TCO2: 28 mmol/L (ref 0–100)
pCO2, Ven: 52 mmHg — ABNORMAL HIGH (ref 45.0–50.0)
pO2, Ven: 32 mmHg (ref 30.0–45.0)

## 2011-06-01 LAB — GLUCOSE, CAPILLARY: Glucose-Capillary: 232 mg/dL — ABNORMAL HIGH (ref 70–99)

## 2011-06-02 LAB — BASIC METABOLIC PANEL
CO2: 26 mEq/L (ref 19–32)
Chloride: 100 mEq/L (ref 96–112)
Creatinine, Ser: 0.78 mg/dL (ref 0.50–1.35)
Glucose, Bld: 182 mg/dL — ABNORMAL HIGH (ref 70–99)

## 2011-06-02 LAB — CBC
HCT: 30.3 % — ABNORMAL LOW (ref 39.0–52.0)
Hemoglobin: 10.8 g/dL — ABNORMAL LOW (ref 13.0–17.0)
MCV: 84.4 fL (ref 78.0–100.0)
Platelets: 151 10*3/uL (ref 150–400)
RBC: 3.59 MIL/uL — ABNORMAL LOW (ref 4.22–5.81)
WBC: 5.7 10*3/uL (ref 4.0–10.5)

## 2011-06-02 LAB — GLUCOSE, CAPILLARY: Glucose-Capillary: 165 mg/dL — ABNORMAL HIGH (ref 70–99)

## 2011-06-04 LAB — GLUCOSE, CAPILLARY: Glucose-Capillary: 185 mg/dL — ABNORMAL HIGH (ref 70–99)

## 2011-06-05 NOTE — Cardiovascular Report (Signed)
  NAMECRISTOPHER, [name] NO.:  [id number]  MEDICAL RECORD NO.:  [id number]  LOCATION:  2501                         FACILITY:  [facility]  PHYSICIAN:  Azaiah Licciardi M. [place], M.D.  DATE OF BIRTH:  Mar 08, 1956  DATE OF PROCEDURE:  06/01/2011 DATE OF DISCHARGE:                           CARDIAC CATHETERIZATION   PROCEDURE:  Coronary intervention.  SURGEON:  Imajean Mcdermid M. [place], MD  INDICATIONS FOR PROCEDURE:  The patient is a 55 year old white male with history of hypertension, diabetes mellitus, hyperlipidemia who presents with symptoms of progressive shortness of breath on exertion.  He had had previous negative evaluation with stress testing in June 2012. Right and left heart catheterization was performed today by Dr. Acie Fredrickson. This demonstrated vigorous LV function.  The patient had significant distal coronary disease in the distal LAD and diagonal branches.  There was a 70% stenosis in the proximal right coronary artery and a 99% segmental stenosis in the PDA.  We recommended percutaneous intervention of the right coronary artery lesions with further medical management of his other disease.  Access was via the right femoral artery access from this cardiac catheterization.  We exchanged for a 6-French sheath.  Equipments used were a 6-French JR-4 guide with side holes, a 0.014 [place] wire, a 2.5 x 12 mm Emerge balloon, a 2.5 x 26 Resolute Integrity stent, a 2.5 x 21 mm [place] Sprinter balloon, a 3.0 x 15 mm Resolute Integrity stent, a 3.5 x 12 mm [place] Sprinter balloon.  The patient received additional 1 mg of Versed and 25 mcg of fentanyl.  He was loaded with Effient 60 mg. Angiomax bolus at 0.75 mg/kg followed by continuous infusion 1.75 mg/kg per hour.  Subsequent ACT was 430 seconds.  Nitroglycerin 200 mcg intracoronary x3.  TECHNIQUE:  After anticoagulation and initial guide shots, we were able to cross the lesions in the right coronary easily with a wire.  We predilated  the PDA using a 2.25 mm Emerge balloon dilating up to 10 atmospheres.  We then placed a 2.5 x 26 mm Resolute Integrity stent deploying this at 9 atmospheres.  This yielded an excellent angiographic result.  We postdilated the stent using a 2.5 x 21 mm [place] Sprinter balloon to 14 atmospheres.  This yielded 0% residual stenosis and TIMI grade 3 flow.  We next addressed the proximal right coronary lesion. This was primarily stented with a 3.0 x 15 mm Resolute integrity stent deployed at 10 atmospheres.  We then postdilated with a 3.5 x 12 mm [place] Sprinter balloon to 14 atmospheres.  This yielded an excellent angiographic result with 0% residual stenosis and TIMI grade 3 flow.  FINAL INTERPRETATION:  Successful intracoronary stenting of the proximal right coronary and the posterior descending artery using drug-eluting stents.  The patient will have his medications optimized for treatment of his other coronary disease.          ______________________________ Nuchem Grattan M. [place], M.D.     PMJ/MEDQ  D:  06/01/2011  T:  06/01/2011  Job:  [id number]  cc:   Jenny Reichmann P. Elvina Mattes, DO Eugene [name] Eugene [name]. Lattie Haw, MD, [place] Luan Pulling, M.D.  Electronically Signed by Kemaria Dedic [place] M.D. on 06/05/2011 01:03:58 PM

## 2011-06-08 NOTE — Telephone Encounter (Signed)
Confirmed information was re-faxed.

## 2011-06-11 ENCOUNTER — Encounter: Payer: Self-pay | Admitting: Cardiology

## 2011-06-14 NOTE — Cardiovascular Report (Signed)
NAMEJACKSON, GONYER NO.:  [id number]  MEDICAL RECORD NO.:  [id number]  LOCATION:  [facility]                         FACILITY:  [facility]  PHYSICIAN:  Thayer Headings, M.D. DATE OF BIRTH:  01/13/1956  DATE OF PROCEDURE:  06/01/2011 DATE OF DISCHARGE:                           CARDIAC CATHETERIZATION   Eugene [name] is a 55 year old gentleman who presented to the hospital recently with progressive shortness of breath with exertion and some chest tightness.  He had a normal B-natriuretic peptide.  He ruled out for myocardial infarction.  He was scheduled for cardiac catheterization for further evaluation.  The patient has never been very active.  He works in a [employer].  He recently has become short of breath walking as little as 100 feet.  He gets short of breath walking to his mailbox.  He also has some episodes of chest tightness.  He is scheduled for cardiac catheterization for further evaluation.  PROCEDURE:  Right left heart catheterization.  The right femoral artery and the right femoral vein were easily cannulated using a modified Seldinger technique.  HEMODYNAMICS:  RA pressure is 12.  RV pressure is 50/14.  Pulmonary capillary wedge pressure ranges between 17 and 20.  Pulmonary artery pressure is 43/70 with a mean of 30.  His pulmonary artery saturation is 55%.  His aortic saturation is 88%.  Cardiac output by thermodilution is 8.52 liters a minute with an index of 3.8.  The cardiac output by Fick calculation is 5.5 liters a minute with cardiac index of 2.47.  ANGIOGRAPHY:  The left ventriculogram was performed first.  It reveals vigorous left ventricular systolic function.  Ejection fraction is approximately 75%.  There are no segmental wall motion abnormalities.  Left main:  The left main is fairly smooth and normal.  The left anterior descending artery has mild to moderate diffuse irregularities throughout its course.  There is a 50% stenosis  in the midpoint.  The distal LAD tapers fairly quickly, but there is no discrete stenosis.  The first diagonal artery is a very tiny vessel with severe disease. The second diagonal vessel is a small vessel with an 80% mid stenosis. This vessel is way too small to be considered a candidate for PCI.  The left circumflex artery is a large vessel.  There is a 40% stenosis at the origin of the first obtuse marginal artery.  There are diffuse mild irregularities throughout the course.  Right coronary artery:  The right coronary artery is moderate in size. There are moderate diffuse irregularities.  There is a 50-60% stenosis proximally, but this does not appear to be flow obstructive.  There are mild irregularities in the mid and distal RCA.  There is a 99% hazy stenosis at the origin of posterior descending artery followed by sequential 70-80% stenosis in the posterior descending artery.  The posterolateral segment artery has mild disease.  I have discussed the case with Dr. Martinique.  The patient is currently not on any real cardiovascular drugs.  I think what we should do is to fix the posterior descending artery stenosis and then he will need aggressive medical therapy including aggressive cholesterol lowering. He will need initiation of diuretics since he obviously is  volume overloaded.  He will also need further pulmonary evaluation.  I do not think that this relatively small posterior descending artery stenosis is the full explanation for his severe dyspnea on exertion.  His symptoms seem to be out of proportion to the degree of myocardium that this PDA supplies.  Further management will be per Dr. [name].     Thayer Headings, M.D.     PJN/MEDQ  D:  06/01/2011  T:  06/01/2011  Job:  [id number]  cc:   [name] Estimable. Eugene Haw, MD, [facility] Phill Myron. Purcell Nails, NP Dr. Drema Balzarine L. Luan Pulling, M.D.  Electronically Signed by Mertie Moores M.D. on 06/14/2011 09:52:33 AM

## 2011-06-18 ENCOUNTER — Encounter: Payer: Self-pay | Admitting: Cardiology

## 2011-06-18 ENCOUNTER — Ambulatory Visit (INDEPENDENT_AMBULATORY_CARE_PROVIDER_SITE_OTHER): Payer: BC Managed Care – PPO | Admitting: Cardiology

## 2011-06-18 VITALS — BP 110/76 | HR 78 | Resp 18 | Ht 73.0 in | Wt 241.0 lb

## 2011-06-18 DIAGNOSIS — E785 Hyperlipidemia, unspecified: Secondary | ICD-10-CM | POA: Insufficient documentation

## 2011-06-18 DIAGNOSIS — I10 Essential (primary) hypertension: Secondary | ICD-10-CM

## 2011-06-18 DIAGNOSIS — R0989 Other specified symptoms and signs involving the circulatory and respiratory systems: Secondary | ICD-10-CM

## 2011-06-18 DIAGNOSIS — I2510 Atherosclerotic heart disease of native coronary artery without angina pectoris: Secondary | ICD-10-CM

## 2011-06-18 NOTE — Assessment & Plan Note (Signed)
No recent lipid profile is available.  One will be obtained.

## 2011-06-18 NOTE — Patient Instructions (Signed)
Your physician recommends that you schedule a follow-up appointment in: 2 months  Your physician recommends that you return for lab work in: Cholesterol in 1 month

## 2011-06-18 NOTE — Progress Notes (Signed)
HPI : Mr. Herpel returns to the office following cardiac catheterization, which revealed three-vessel disease with critical stenosis limited to the RCA and 2 diagonals, both of which were too small to allow for intervention.  Two drug-eluting stents were placed in the RCA without difficulty.  LV systolic function was normal.  There was moderate pulmonary hypertension with mildly elevated pulmonary capillary wedge pressure.  Aortic saturation was 88% with a normal cardiac output.  Since discharge, patient has done fairly well.  Exertional chest tightness has resolved, but he continues to have class 2-3 dyspnea on exertion.  Dr. Luan Pulling is evaluating him for lung disease.  He has noted episodes of moderately severe sharp discomfort, localized to the left chest, that occur unpredictably but at least on a daily basis and resolve spontaneously within a matter of seconds.  No relationship to exertion or movement of the trunk or upper extremities is apparent. He has experienced intermittent chest wall tightness, but no associated symptoms and no radiation.  Current Outpatient Prescriptions on File Prior to Visit  Medication Sig Dispense Refill  . carvedilol (COREG) 12.5 MG tablet Take 12.5 mg by mouth 2 (two) times daily with a meal.        . gabapentin (NEURONTIN) 300 MG capsule Take 300 mg by mouth 3 (three) times daily.        . insulin glargine (LANTUS SOLOSTAR) 100 UNIT/ML injection Inject 80 Units into the skin at bedtime.        . metFORMIN (GLUCOPHAGE) 500 MG tablet Take 500 mg by mouth 2 (two) times daily with a meal.        . prasugrel (EFFIENT) 10 MG TABS Take 10 mg by mouth daily.        . ramipril (ALTACE) 5 MG tablet Take 5 mg by mouth daily.        . simvastatin (ZOCOR) 40 MG tablet Take 40 mg by mouth at bedtime.          No Known Allergies    Past medical history, social history, and family history reviewed and updated.  ROS: Denies orthopnea, PND, peripheral edema, palpitations,  lightheadedness or syncope.  PHYSICAL EXAM: BP 110/76  Pulse 78  Resp 18  Ht 6\' 1"  (1.854 m)  Wt 109.317 kg (241 lb)  BMI 31.80 kg/m2  General-Well developed; no acute distress Body habitus-overweight Neck-No JVD; no carotid bruits Lungs-clear lung fields; resonant to percussion Cardiovascular-normal PMI; normal S1 and S2; S4 present Abdomen-normal bowel sounds; soft and non-tender without masses or organomegaly Musculoskeletal-No deformities, no cyanosis or clubbing Neurologic-Normal cranial nerves; symmetric strength and tone Skin-Warm, no significant lesions Extremities-distal pulses intact; no edema  ASSESSMENT AND PLAN:

## 2011-06-18 NOTE — Assessment & Plan Note (Addendum)
[name] is doing well following percutaneous intervention.  His chest discomfort is atypical and unlikely to represent myocardial ischemia.  He will call should this worsen or should he develop any new or worrisome symptoms.  The high importance of continuing prasugrel without interruption was discussed with him  Eugene [name] has been off work for a number of months on short-term disability.  Although symptoms of myocardial ischemia have improved or may in fact be entirely resolved, he continues to have exercise intolerance and severe dyspnea on exertion with objective evidence for lung problems including resting hypoxemia.  I doubt that he would be capable of performing the fairly active duties that his prior position required.

## 2011-06-18 NOTE — Assessment & Plan Note (Addendum)
Resting pO2 was 88% in the cardiac catheterization laboratory.  A CT scan of the chest ruled out pulmonary embolism or parenchymal lung disease.  Pulmonary Function Tests performed in 05/2011 revealed normal spirometry, moderately reduced lung volumes including total lung capacity, normal DLCO and hypoxemia.  Further evaluation with Dr. Luan Pulling, pulmonologist, is proceeding.  A sleep study and treatment with home oxygen is planned.

## 2011-06-18 NOTE — Assessment & Plan Note (Signed)
Blood pressure control is excellent; current medications will be continued.

## 2011-06-19 NOTE — Progress Notes (Signed)
Copy of note faxed to primary care physician Jacqualine Code, per his request to [phone].

## 2011-06-25 ENCOUNTER — Encounter: Payer: Self-pay | Admitting: *Deleted

## 2011-06-29 NOTE — Discharge Summary (Signed)
Eugene [name], Eugene [name]           ACCOUNT NO.:  [id number]  MEDICAL RECORD NO.:  [id number]  LOCATION:  2501                         FACILITY:  [facility]  PHYSICIAN:  Shaune Pascal. Daija Routson, MDDATE OF BIRTH:  12/16/1955  DATE OF ADMISSION:  06/01/2011 DATE OF DISCHARGE:  06/02/2011                              DISCHARGE SUMMARY   PRIMARY CARDIOLOGIST:  [name] Estimable. Lattie Haw, MD, [facility]  DISCHARGE DIAGNOSES: 1. Coronary artery disease status post stenting of the proximal right     coronary and posterior descending artery using drug-eluting stent     this admission. 2. Dyspnea with restrictive lung physiology.     a.     No pulmonary arterial hypertension on cath. 3. Chronic respiratory failure with exertional hypoxemia. 4. Morbid obesity status post weight loss. 5. Uncontrolled diabetes mellitus type 2.  SECONDARY DIAGNOSES: 1. Hypercholesterolemia. 2. Bell's palsy.  PROCEDURES/DIAGNOSTICS PERFORMED DURING HOSPITALIZATION: 1. Right heart catheterization.     a.     RA pressure of 12.  RV pressure 50/14.  Pulmonary capillary      wedge pressure between 17 and 20.  Pulmonary artery pressure is      43/70 with mean of 30.  Pulmonary artery saturation is 55% with      aortic saturation 88%.  Fick cardiac output 5.5 liters per minute      with cardiac index of 2.47. 2. Left heart catheterization, ejection fraction approximately 75%.     Left main fairly significantly normal.  Left anterior descending     with mild to moderate diffuse irregularities of 50% stenosis in the     mild distal LAD.  First diagonal, severe disease, very tiny vessel.     Second diagonal with 80% mid section stenosis, felt too small for     PCI.  Left circumflex with 40% at the origin of first obtuse     marginal.  Right coronary with moderate diffuse irregularities, 50-     60% proximally.  Mild irregularities of the mid to distal section.     99% hazy stenosis of origin of the posterior descending artery  followed by 78% stenosis in the posterior descending artery.     Posterolateral segment artery with mild disease.  REASON FOR HOSPITALIZATION:  This is a 55 year old gentleman who presents with progressive shortness of breath with exertion and chest tightness to [facility].  With the patient ruled out for myocardial infarction, it was felt the patient should be scheduled for right and left heart catheterization for further evaluation.  HOSPITAL COURSE:  The patient was transferred to [facility].  Risks, benefits, and indications of catheterization were discussed with the patient and he agreed to proceed.  The patient was brought to the Catheterization Lab by Dr. Acie Fredrickson on October 5.  As above, left heart catheterization demonstrated significant coronary disease in the distal LAD and diagonal branches.  A 70% stenosis in the proximal right artery and 90% segmental stenosis in the PDA.  It was recommended for percutaneous coronary intervention.  Dr. Martinique completed successful intracoronary stenting in the proximal right coronary and posterior descending artery using a 3.0 x 15 mm Resolute Integrity drug-eluting stent and Resolute Integrity 2.5 x 26-mm drug-eluting  stent respectively.  The patient tolerated this procedure well and was placed on Effient which he will continue for 1 year.  Of note, right heart catheterization showed pulmonary arterial hypertension with a PVR calculated at 1.9.  The patient was taken for overnight observation and felt extremely well post catheterization.  He states that his shortness of breath is much improved and actually walked 380 feet with cardiac rehab without difficulty or complaints of shortness of breath.  Oxygen saturation was monitored during this time and it remained 98%.  Therefore at this time, no home oxygen was recommended.  The patient's groin site was without problems.  Of note, hemoglobin A1c did show 9.7 and he will need  to follow up with his primary care provider for further titration in his medication.  On day of discharge, Dr. Haroldine Laws evaluated the patient and noted him stable for home.  The patient has requested a referral to the [facility] and this has been completed.  The patient will be continued on aspirin, Effient, Altace, Coreg, and statin.  Discharge plans and instructions were discussed with the patient and he voiced understanding.  DISCHARGE LABS:  Sodium 134, potassium 3.8, BUN 21, creatinine 0.78, hemoglobin 10.8, hematocrit 30.3.  DISCHARGE MEDICATIONS: 1. Altace 5 mg daily. 2. Gabapentin 300 mg 3 times a day. 3. Coreg 12.5 mg twice daily. 4. Metformin 500 mg twice daily, the patient is to hold until Monday     October 8. 5. Lantus 80 units nightly. 6. Aspirin 81 mg daily. 7. Effient 10 mg daily. 8. Simvastatin 40 mg daily.  FOLLOWUP PLANS/INSTRUCTIONS: 1. The patient will follow up with Dr. Lattie Haw at the [facility]     Cardiology [place] office in 2 weeks, the office will call to     schedule this appointment. 2. The patient should follow up with his primary care provider Dr.     Elvina Mattes in [place] in 4 weeks, the patient is to call and     schedule this appointment. 3. The patient is to increase activity as tolerated. 4. He is not to lift anything greater than 5 pounds for 1 week.  He is     not to drive for 2 days.  He is not to have sexual activity for 1     week. 5. The patient is to continue low-sodium heart-healthy and diabetic     diet. 6. The patient is to keep his cath site clean and dry and call the     office for any problems. 7. Call office for any problems. 8. The patient is to avoid straining and stop activities that cause     chest pain or shortness of breath. 9. The patient is to call the office in the interim for any problems     or concerns.  DURATION OF DURATION:  Greater than 30 minutes with physician and physician  extender time.       Cecille Amsterdam, PA-C   ______________________________ Shaune Pascal. Yoceline Bazar, MD    NB/MEDQ  D:  06/02/2011  T:  06/02/2011  Job:  [id number]  cc:   [name] Estimable. Lattie Haw, MD, [facility] Dr. Elvina Mattes  Electronically Signed by Pennie Rushing P.A. on 06/12/2011 11:49:47 AM Electronically Signed by Glori Bickers MD on 06/29/2011 02:04:53 PM

## 2011-07-17 ENCOUNTER — Telehealth: Payer: Self-pay | Admitting: Cardiology

## 2011-07-17 ENCOUNTER — Other Ambulatory Visit: Payer: Self-pay | Admitting: Cardiology

## 2011-07-17 LAB — LIPID PANEL
Cholesterol: 180 mg/dL (ref 0–200)
Total CHOL/HDL Ratio: 5.1 Ratio

## 2011-07-17 MED ORDER — SIMVASTATIN 40 MG PO TABS: 40.0000 mg | ORAL_TABLET | Freq: Every day | ORAL | Status: DC

## 2011-07-17 NOTE — Telephone Encounter (Signed)
PT NEEDS A 90 DAYS RX CALLED IN FOR SIMVASTATIN 40MG  TO [facility] IN [place].

## 2011-07-23 ENCOUNTER — Ambulatory Visit: Payer: BC Managed Care – PPO | Attending: Pulmonary Disease | Admitting: Sleep Medicine

## 2011-07-23 DIAGNOSIS — G4733 Obstructive sleep apnea (adult) (pediatric): Secondary | ICD-10-CM | POA: Insufficient documentation

## 2011-07-23 DIAGNOSIS — G4730 Sleep apnea, unspecified: Secondary | ICD-10-CM

## 2011-07-29 NOTE — Procedures (Signed)
NAME:  Koos, Halton           ACCOUNT NO.:  [id number]  MEDICAL RECORD NO.:  [id number]          PATIENT TYPE:  OUT  LOCATION:  SLEEP LAB                     FACILITY:  [facility]  PHYSICIAN:  Hawley Michel A. Merlene Laughter, M.D. DATE OF BIRTH:  [birth year]  DATE OF STUDY:  07/23/2011                           NOCTURNAL POLYSOMNOGRAM  REFERRING PHYSICIAN:  Percell Miller L. Luan Pulling, M.D.  INDICATION:  This 55 year old who presents with hypersomnia, fatigue and snoring.  This study is being done to evaluate for obstructive sleep apnea syndrome.  MEDICATIONS:  Insulin, aspirin, Symbicort, Benicar, simvastatin, Coreg.  EPWORTH SLEEPINESS SCORE:  18, BMI 32.  ARCHITECTURAL SUMMARY:  This is a split night recording with the initial portion being a diagnostic study and the second portion being a titration recording.  The total recording time is 444 minutes.  Sleep efficiency is 78%.  Sleep latency 14 minutes.  REM latency 100 minutes.  RESPIRATORY SUMMARY:  Baseline oxygen saturation is 96, lowest saturation 73 during REM sleep.  The diagnostic AHI is 49.  The patient was started on positive pressure at 5 and increased to 12.  Optimal pressure is 12 with resolution of obstructive events and good tolerance.  LIMB MOVEMENT SUMMARY:  PLM index 7.3.  ELECTROCARDIOGRAM SUMMARY:  Average heart rate is 72 with no significant dysrhythmias observed.  IMPRESSION:  Severe obstructive sleep apnea syndrome, which responds well to a CPAP of 10.    Thaddus Mcdowell A. Merlene Laughter, M.D.    KAD/MEDQ  D:  07/29/2011 17:27:01  T:  07/29/2011 21:38:47  Job:  [id number]

## 2011-08-22 ENCOUNTER — Emergency Department (HOSPITAL_COMMUNITY)
Admission: EM | Admit: 2011-08-22 | Discharge: 2011-08-22 | Disposition: A | Discharge status: Home / Self Care | Payer: BC Managed Care – PPO | Attending: Emergency Medicine | Admitting: Emergency Medicine

## 2011-08-22 ENCOUNTER — Encounter (HOSPITAL_COMMUNITY): Payer: Self-pay | Admitting: *Deleted

## 2011-08-22 ENCOUNTER — Emergency Department (HOSPITAL_COMMUNITY): Payer: BC Managed Care – PPO

## 2011-08-22 ENCOUNTER — Other Ambulatory Visit: Payer: Self-pay

## 2011-08-22 DIAGNOSIS — E785 Hyperlipidemia, unspecified: Secondary | ICD-10-CM | POA: Insufficient documentation

## 2011-08-22 DIAGNOSIS — R10812 Left upper quadrant abdominal tenderness: Secondary | ICD-10-CM | POA: Insufficient documentation

## 2011-08-22 DIAGNOSIS — R0602 Shortness of breath: Secondary | ICD-10-CM | POA: Insufficient documentation

## 2011-08-22 DIAGNOSIS — Z79899 Other long term (current) drug therapy: Secondary | ICD-10-CM | POA: Insufficient documentation

## 2011-08-22 DIAGNOSIS — R1012 Left upper quadrant pain: Secondary | ICD-10-CM | POA: Insufficient documentation

## 2011-08-22 DIAGNOSIS — I2510 Atherosclerotic heart disease of native coronary artery without angina pectoris: Secondary | ICD-10-CM | POA: Insufficient documentation

## 2011-08-22 DIAGNOSIS — E119 Type 2 diabetes mellitus without complications: Secondary | ICD-10-CM | POA: Insufficient documentation

## 2011-08-22 LAB — BASIC METABOLIC PANEL
BUN: 31 mg/dL — ABNORMAL HIGH (ref 6–23)
Creatinine, Ser: 0.87 mg/dL (ref 0.50–1.35)
GFR calc Af Amer: >90 mL/min (ref >90)
GFR calc non Af Amer: >90 mL/min (ref >90)
Glucose, Bld: 368 mg/dL — ABNORMAL HIGH (ref 70–99)

## 2011-08-22 LAB — CBC
HCT: 36.6 % — ABNORMAL LOW (ref 39.0–52.0)
Hemoglobin: 13.4 g/dL (ref 13.0–17.0)
MCH: 31.5 pg (ref 26.0–34.0)
MCHC: 36.6 g/dL — ABNORMAL HIGH (ref 30.0–36.0)
MCV: 85.9 fL (ref 78.0–100.0)

## 2011-08-22 LAB — DIFFERENTIAL
Basophils Relative: 0 % (ref 0–1)
Eosinophils Absolute: 0.1 10*3/uL (ref 0.0–0.7)
Eosinophils Relative: 2 % (ref 0–5)
Monocytes Absolute: 0.4 10*3/uL (ref 0.1–1.0)
Monocytes Relative: 7 % (ref 3–12)

## 2011-08-22 LAB — POCT I-STAT TROPONIN I

## 2011-08-22 MED ORDER — ONDANSETRON HCL 4 MG/2ML IJ SOLN
4.0000 mg | Freq: Once | INTRAMUSCULAR | Status: AC
  Administered 2011-08-22: 4 mg via INTRAVENOUS
  Filled 2011-08-22: qty 2

## 2011-08-22 MED ORDER — HYDROMORPHONE HCL PF 1 MG/ML IJ SOLN
1.0000 mg | Freq: Once | INTRAMUSCULAR | Status: AC
  Administered 2011-08-22: 1 mg via INTRAVENOUS
  Filled 2011-08-22: qty 1

## 2011-08-22 MED ORDER — OXYCODONE-ACETAMINOPHEN 5-325 MG PO TABS: 2.0000 | ORAL_TABLET | ORAL | Status: DC | PRN

## 2011-08-22 NOTE — ED Notes (Signed)
MD at bedside. 

## 2011-08-22 NOTE — ED Notes (Signed)
Patient denies pain and is resting comfortably.  

## 2011-08-22 NOTE — ED Notes (Signed)
Pt  In left side of chest x 2 wks that has gotten worse today. Pt describes the pain as tightness. Pt states he was told he had a gallstone 2 months ago. Pt

## 2011-08-22 NOTE — ED Provider Notes (Signed)
History     CSN: [id number]  Arrival date & time 08/22/11  0104   First MD Initiated Contact with Patient 08/22/11 0114      Chief Complaint  Patient presents with  . Chest Pain    (Consider location/radiation/quality/duration/timing/severity/associated sxs/prior treatment) HPI.    Chief complaint left upper quadrant pain. History of present illness:  Left upper quadrant pain for 2 weeks describes a tightness. Some shortness of breath. No nausea vomiting or diaphoresis. Status post 2 stents placed approximately 2 months ago.  thing makes it better or worse. He is able to eat. Pain is moderate.symptoms come and go  Past Medical History  Diagnosis Date  . Arteriosclerotic cardiovascular disease (ASCVD)     Cath in 05/2011: Mild pulmonary hypertension, hyperdynamic LV, 50% LAD, severe first diagonal disease in a small vessel, 80% mid D2 also small, 40% circumflex, 50-60% RCA proximally and 99% distally-DES x2  . Hyperlipidemia   . Diabetes mellitus     Peripheral neuropathy  . Bell palsy   . Nephrolithiasis   . Abnormal cardiac cath     Past Surgical History  Procedure Date  . Circumcision   . Stents     Family History  Problem Relation Age of Onset  . Diabetes Mother   . Heart attack Mother   . Stroke Mother   . Diabetes Sister   . Sleep apnea Sister   . Hypertension Brother   . Diabetes Brother     History  Substance Use Topics  . Smoking status: Never Smoker   . Smokeless tobacco: Not on file  . Alcohol Use: No      Review of Systems  All other systems reviewed and are negative.    Allergies  Review of patient's allergies indicates no known allergies.  Home Medications   Current Outpatient Rx  Name Route Sig Dispense Refill  . ASPIRIN 81 MG PO TABS Oral Take 81 mg by mouth daily.      Dellis Anes IN Inhalation Inhale into the lungs as directed.      [name] CARVEDILOL 12.5 MG PO TABS Oral Take 12.5 mg by mouth 2 (two) times daily with a meal.      .  GABAPENTIN 300 MG PO CAPS Oral Take 300 mg by mouth 3 (three) times daily.      . INSULIN GLARGINE 100 UNIT/ML [place] SOLN Subcutaneous Inject 80 Units into the skin at bedtime.      [name] LISINOPRIL 20 MG PO TABS Oral Take 20 mg by mouth daily.      [name] METFORMIN HCL 500 MG PO TABS Oral Take 500 mg by mouth 2 (two) times daily with a meal.      . PRASUGREL HCL 10 MG PO TABS Oral Take 10 mg by mouth daily.      [name] RAMIPRIL 5 MG PO TABS Oral Take 5 mg by mouth daily.        There were no vitals taken for this visit.  Physical Exam  Nursing note and vitals reviewed. Constitutional: He is oriented to person, place, and time. He appears well-developed and well-nourished.  HENT:  Head: Normocephalic and atraumatic.  Eyes: Conjunctivae and EOM are normal. Pupils are equal, round, and reactive to light.  Neck: Normal range of motion. Neck supple.  Cardiovascular: Normal rate and regular rhythm.   Pulmonary/Chest: Effort normal and breath sounds normal.  Abdominal: Soft. Bowel sounds are normal.       Minimal left upper quadrant tenderness.  Musculoskeletal: Normal range of motion.  Neurological: He is alert and oriented to person, place, and time.  Skin: Skin is warm and dry.  Psychiatric: He has a normal mood and affect.    ED Course  Procedures (including critical care time)  Labs Reviewed - No data to display No results found. Results for orders placed during the hospital encounter of 08/22/11  CBC      Component Value Range   WBC 6.1  4.0 - 10.5 (K/uL)   RBC 4.26  4.22 - 5.81 (MIL/uL)   Hemoglobin 13.4  13.0 - 17.0 (g/dL)   HCT 36.6 (*) 39.0 - 52.0 (%)   MCV 85.9  78.0 - 100.0 (fL)   MCH 31.5  26.0 - 34.0 (pg)   MCHC 36.6 (*) 30.0 - 36.0 (g/dL)   RDW 12.9  11.5 - 15.5 (%)   Platelets 193  150 - 400 (K/uL)  DIFFERENTIAL      Component Value Range   Neutrophils Relative 56  43 - 77 (%)   Neutro Abs 3.4  1.7 - 7.7 (K/uL)   Lymphocytes Relative 35  12 - 46 (%)   Lymphs Abs 2.2  0.7 -  4.0 (K/uL)   Monocytes Relative 7  3 - 12 (%)   Monocytes Absolute 0.4  0.1 - 1.0 (K/uL)   Eosinophils Relative 2  0 - 5 (%)   Eosinophils Absolute 0.1  0.0 - 0.7 (K/uL)   Basophils Relative 0  0 - 1 (%)   Basophils Absolute 0.0  0.0 - 0.1 (K/uL)  BASIC METABOLIC PANEL      Component Value Range   Sodium 128 (*) 135 - 145 (mEq/L)   Potassium 4.2  3.5 - 5.1 (mEq/L)   Chloride 94 (*) 96 - 112 (mEq/L)   CO2 24  19 - 32 (mEq/L)   Glucose, Bld 368 (*) 70 - 99 (mg/dL)   BUN 31 (*) 6 - 23 (mg/dL)   Creatinine, Ser 0.87  0.50 - 1.35 (mg/dL)   Calcium 9.8  8.4 - 10.5 (mg/dL)   GFR calc non Af Amer >90  >90 (mL/min)   GFR calc Af Amer >90  >90 (mL/min)  CARDIAC PANEL(CRET KIN+CKTOT+MB+TROPI)      Component Value Range   Total CK 179  7 - 232 (U/L)   CK, MB 4.9 (*) 0.3 - 4.0 (ng/mL)   Troponin I <0.30  <0.30 (ng/mL)   Relative Index 2.7 (*) 0.0 - 2.5   POCT I-STAT TROPONIN I      Component Value Range   Troponin i, poc 0.00  0.00 - 0.08 (ng/mL)   Comment 3             No diagnosis found.  Date: 08/22/2011  Rate: 98  Rhythm: normal sinus rhythm  QRS Axis: normal  Intervals: normal  ST/T Wave abnormalities: normal  Conduction Disutrbances:right bundle branch block  Narrative Interpretation:   Old EKG Reviewed: changes noted    MDM  History and physical not consistent with acute coronary syndrome. Glucose is elevated. However white count is normal. Patient does not look in acute distress. No acute abdomen. No evidence of MI.        Nat Christen, MD 08/22/11 [phone]

## 2011-08-22 NOTE — ED Notes (Signed)
Pt stable to call for ride home with spouse to drive; pt is pain free with no complaints

## 2011-08-23 ENCOUNTER — Encounter: Payer: Self-pay | Admitting: Cardiology

## 2011-08-23 ENCOUNTER — Ambulatory Visit (INDEPENDENT_AMBULATORY_CARE_PROVIDER_SITE_OTHER): Payer: BC Managed Care – PPO | Admitting: Cardiology

## 2011-08-23 DIAGNOSIS — E871 Hypo-osmolality and hyponatremia: Secondary | ICD-10-CM | POA: Insufficient documentation

## 2011-08-23 DIAGNOSIS — E119 Type 2 diabetes mellitus without complications: Secondary | ICD-10-CM

## 2011-08-23 DIAGNOSIS — I10 Essential (primary) hypertension: Secondary | ICD-10-CM

## 2011-08-23 DIAGNOSIS — G4733 Obstructive sleep apnea (adult) (pediatric): Secondary | ICD-10-CM

## 2011-08-23 DIAGNOSIS — E785 Hyperlipidemia, unspecified: Secondary | ICD-10-CM

## 2011-08-23 MED ORDER — PRAVASTATIN SODIUM 10 MG PO TABS: 10.0000 mg | ORAL_TABLET | Freq: Every day | ORAL | Status: DC

## 2011-08-23 MED ORDER — VALSARTAN 320 MG PO TABS: 320.0000 mg | ORAL_TABLET | Freq: Every day | ORAL | Status: DC

## 2011-08-23 MED ORDER — DILTIAZEM HCL ER 180 MG PO CP24: 180.0000 mg | ORAL_CAPSULE | Freq: Every day | ORAL | Status: DC

## 2011-08-23 NOTE — Assessment & Plan Note (Signed)
Patient is unaware of any recent hemoglobin A1c measurements.  We will seek recent laboratory results from his primary care physician.

## 2011-08-23 NOTE — Patient Instructions (Addendum)
Your physician recommends that you schedule a follow-up appointment in: 2 months  Your physician has recommended you make the following change in your medication:  1 - STOP Coreg 2 - STOP lisinopril 3 - START Valsartan 320 mg daily 4 - START Diltiazem 180 mg daily 5 - STOP Simvastatin 6 - START Pravachol 10 mg 1/2 tablet daily for 1 month.  If no adverse symptoms, increase to 1 tablet daily.    Your physician has requested that you regularly monitor and record your blood pressure readings at home. Please use the same machine at the same time of day to check your readings and record them to bring to your follow-up visit.

## 2011-08-23 NOTE — Assessment & Plan Note (Addendum)
Blood pressure control suboptimal, and patient is apparently experiencing an adverse effect of his lisinopril.  This will be discontinued and valsartan/HCT started at a dose of 320/25 mg per day with a chemistry profile checked in one month.  Diltiazem will also be added at a dose of 180 mg per day.  Patient will monitor blood pressure on his own and return in 2 months for reassessment.  Patient reports symptoms consistent with orthostatic hypotension, but there is no significant change in blood pressure as measured in the office today.  Patient was instructed regarding the importance of preventing falls when dizziness occurs.  We will continue to monitor closely, as addition of a diuretic could exacerbate dizziness and/or hyponatremia.

## 2011-08-23 NOTE — Assessment & Plan Note (Signed)
Patient did not tolerate treatment with simvastatin.  Pravastatin will be started at a very low dose of 5 mg per day and increased to 10 mg per day in one month if the patient is experiencing no adverse effects.  He will need a repeat lipid profile in the future.

## 2011-08-23 NOTE — Assessment & Plan Note (Signed)
Patient has improved with nocturnal CPAP therapy and supplemental oxygen.  Based upon ABG in 05/2011 with pH of 7.38, pCO2 of 44 and pO2 of 56, he also has an element of hypoventilation at rest and substantial hypoxemia.  The contribution of his weight is uncertain, since his BMI is only approximately 30.  Dr. Luan Pulling is assessing for additional causes of hypoxemia.  He might benefit from continuous oxygen therapy.

## 2011-08-23 NOTE — Progress Notes (Signed)
Patient ID: Eugene [name], male   DOB: 1956-07-31, 55 y.o.   MRN: [id number] HPI: Return visit for continued assessment and treatment of coronary artery disease and cardiovascular risk factors.  A sleep study performed by Dr. Luan Pulling revealed severe sleep apnea.  Daytime somnolence and sense of well-being have improved since nocturnal CPAP therapy initiated.  Patient continues to have exertional dyspnea, but no chest discomfort.  He has noted a nagging cough that has been present for months.  He developed arthralgias while taking simvastatin and has discontinued that medication.  He has experienced lightheadedness, particularly when moving about during the morning hours.  He sits briefly with resolution of the symptoms.  He denies loss of consciousness or falls.  Prior to Admission medications   Medication Sig Start Date End Date Taking? Authorizing Provider  aspirin 81 MG tablet Take 81 mg by mouth daily.     Yes Historical Provider, MD  Budesonide-Formoterol Fumarate (SYMBICORT IN) Inhale into the lungs as directed.     Yes Historical Provider, MD  gabapentin (NEURONTIN) 300 MG capsule Take 300 mg by mouth 3 (three) times daily.     Yes Historical Provider, MD  insulin glargine (LANTUS SOLOSTAR) 100 UNIT/ML injection Inject 80 Units into the skin at bedtime.     Yes Historical Provider, MD  metFORMIN (GLUCOPHAGE) 500 MG tablet Take 500 mg by mouth 2 (two) times daily with a meal.     Yes Historical Provider, MD  prasugrel (EFFIENT) 10 MG TABS Take 10 mg by mouth daily.     Yes Historical Provider, MD  diltiazem (DILACOR XR) 180 MG 24 hr capsule Take 1 capsule (180 mg total) by mouth daily. 08/23/11 08/22/12  [name] Estimable. Yesli Vanderhoff, MD  pravastatin (PRAVACHOL) 10 MG tablet Take 1 tablet (10 mg total) by mouth daily. 08/23/11 08/22/12  [name] Estimable. Anetha Slagel, MD  ramipril (ALTACE) 5 MG tablet Take 5 mg by mouth daily.     Historical Provider, MD  valsartan (DIOVAN) 320 MG tablet Take 1 tablet (320 mg  total) by mouth daily. 08/23/11 08/22/12  [name] Estimable. Lattie Haw, MD  No Known Allergies    Past medical history, social history, and family history reviewed and updated.  ROS: See history of present illness.  PHYSICAL EXAM: BP 150/92  Pulse 89  Ht 6\' 1"  (1.854 m)  Wt 109.77 kg (242 lb)  BMI 31.93 kg/m2  General-Well developed; no acute distress Body habitus-mildly overweight Neck-No JVD; no carotid bruits Lungs-expiratory rhonchi with prolonged expiratory phase; resonant to percussion Cardiovascular-normal PMI; normal S1 and S2; S4 and minimal systolic ejection murmur present Abdomen-normal bowel sounds; soft and non-tender without masses or organomegaly Musculoskeletal-No deformities, no cyanosis or clubbing Neurologic-Normal cranial nerves; symmetric strength and tone Skin-Warm, no significant lesions Extremities-distal pulses intact; trace edema  ASSESSMENT AND PLAN:  Jacqulyn Ducking, MD 08/23/2011 2:49 PM  and

## 2011-10-08 ENCOUNTER — Emergency Department (HOSPITAL_COMMUNITY): Payer: PRIVATE HEALTH INSURANCE

## 2011-10-08 ENCOUNTER — Encounter (HOSPITAL_COMMUNITY): Payer: Self-pay

## 2011-10-08 ENCOUNTER — Emergency Department (HOSPITAL_COMMUNITY)
Admission: EM | Admit: 2011-10-08 | Discharge: 2011-10-08 | Disposition: A | Discharge status: Home / Self Care | Payer: PRIVATE HEALTH INSURANCE | Attending: Emergency Medicine | Admitting: Emergency Medicine

## 2011-10-08 ENCOUNTER — Other Ambulatory Visit: Payer: Self-pay

## 2011-10-08 DIAGNOSIS — R0602 Shortness of breath: Secondary | ICD-10-CM | POA: Insufficient documentation

## 2011-10-08 DIAGNOSIS — Z87442 Personal history of urinary calculi: Secondary | ICD-10-CM | POA: Insufficient documentation

## 2011-10-08 DIAGNOSIS — Z794 Long term (current) use of insulin: Secondary | ICD-10-CM | POA: Insufficient documentation

## 2011-10-08 DIAGNOSIS — R0609 Other forms of dyspnea: Secondary | ICD-10-CM

## 2011-10-08 DIAGNOSIS — R0789 Other chest pain: Secondary | ICD-10-CM | POA: Insufficient documentation

## 2011-10-08 DIAGNOSIS — I4510 Unspecified right bundle-branch block: Secondary | ICD-10-CM | POA: Insufficient documentation

## 2011-10-08 DIAGNOSIS — E785 Hyperlipidemia, unspecified: Secondary | ICD-10-CM | POA: Insufficient documentation

## 2011-10-08 DIAGNOSIS — I2510 Atherosclerotic heart disease of native coronary artery without angina pectoris: Secondary | ICD-10-CM | POA: Insufficient documentation

## 2011-10-08 DIAGNOSIS — Z7982 Long term (current) use of aspirin: Secondary | ICD-10-CM | POA: Insufficient documentation

## 2011-10-08 DIAGNOSIS — E119 Type 2 diabetes mellitus without complications: Secondary | ICD-10-CM | POA: Insufficient documentation

## 2011-10-08 LAB — POCT I-STAT TROPONIN I
Troponin i, poc: 0 ng/mL (ref 0.00–0.08)
Troponin i, poc: 0 ng/mL (ref 0.00–0.08)

## 2011-10-08 LAB — CBC
HCT: 38.4 % — ABNORMAL LOW (ref 39.0–52.0)
MCV: 84.6 fL (ref 78.0–100.0)
Platelets: 177 10*3/uL (ref 150–400)
RBC: 4.54 MIL/uL (ref 4.22–5.81)
WBC: 6.2 10*3/uL (ref 4.0–10.5)

## 2011-10-08 LAB — BASIC METABOLIC PANEL
BUN: 18 mg/dL (ref 6–23)
CO2: 25 mEq/L (ref 19–32)
Chloride: 96 mEq/L (ref 96–112)
Creatinine, Ser: 0.74 mg/dL (ref 0.50–1.35)
GFR calc Af Amer: >90 mL/min (ref >90)
Potassium: 4.4 mEq/L (ref 3.5–5.1)

## 2011-10-08 MED ORDER — SODIUM CHLORIDE 0.9 % IV BOLUS (SEPSIS)
1000.0000 mL | Freq: Once | INTRAVENOUS | Status: AC
  Administered 2011-10-08: 1000 mL via INTRAVENOUS

## 2011-10-08 MED ORDER — INSULIN REGULAR HUMAN 100 UNIT/ML IJ SOLN
8.0000 [IU] | Freq: Once | INTRAMUSCULAR | Status: AC
  Administered 2011-10-08: 8 [IU] via SUBCUTANEOUS

## 2011-10-08 MED ORDER — ASPIRIN 325 MG PO TABS
325.0000 mg | ORAL_TABLET | ORAL | Status: AC
  Administered 2011-10-08: 325 mg via ORAL
  Filled 2011-10-08: qty 1

## 2011-10-08 MED ORDER — NITROGLYCERIN 0.4 MG SL SUBL
0.4000 mg | SUBLINGUAL_TABLET | SUBLINGUAL | Status: DC | PRN
  Administered 2011-10-08 (×3): 0.4 mg via SUBLINGUAL
  Filled 2011-10-08: qty 25

## 2011-10-08 NOTE — ED Notes (Signed)
Pt present with left side tightness that started at 0730. C/o sob along with discomfort and feeling " Like Im choking" pt a/o x3. Speaks clear sentences. nad noted at time of triage. Skin warm/dry.

## 2011-10-08 NOTE — ED Provider Notes (Signed)
History   This chart was scribed for Kathalene Frames, MD by Carolyne Littles. The patient was seen in room APA17/APA17 and the patient's care was started at 8:56AM.   CSN: [id number]  Arrival date & time 10/08/11  [id number]   First MD Initiated Contact with Patient 10/08/11 [phone]      Chief Complaint  Patient presents with  . Chest Pain    HPI Aydien Sudbeck Pasch is a 56 y.o. male who presents to the Emergency Department complaining of constant moderate chest discomfort described as tightness localized to left side of chest onset 3 weeks ago and persistent since with associated intermittent episodes of SOB. States pain is aggravated with exertion and relieved by nothing. Denies fever, nausea, vomiting, diarrhea, cough. Patient with h/o HLD, diabetes, ASCVD and has had multiple stents placed (last placed 5 months ago). Patient states the symptoms do not feel exactly like his heart. His pain is not increased with palpation or movement. Cardiologist- [place]  Past Medical History  Diagnosis Date  . Arteriosclerotic cardiovascular disease (ASCVD)     Cath in 05/2011: Mild pulmonary hypertension, hyperdynamic LV, 50% LAD, severe first diagonal disease in a small vessel, 80% mid D2 also small, 40% circumflex, 50-60% RCA proximally and 99% distally-DES x2  . Hyperlipidemia   . Diabetes mellitus     Peripheral neuropathy  . Bell palsy   . Nephrolithiasis   . Abnormal cardiac cath     Past Surgical History  Procedure Date  . Circumcision   . Stents     Family History  Problem Relation Age of Onset  . Diabetes Mother   . Heart attack Mother   . Stroke Mother   . Diabetes Sister   . Sleep apnea Sister   . Hypertension Brother   . Diabetes Brother     History  Substance Use Topics  . Smoking status: Never Smoker   . Smokeless tobacco: Not on file  . Alcohol Use: No      Review of Systems 10 Systems reviewed and are negative for acute change except as noted in the HPI.  Allergies    Review of patient's allergies indicates no known allergies.  Home Medications   Current Outpatient Rx  Name Route Sig Dispense Refill  . ASPIRIN 81 MG PO TABS Oral Take 81 mg by mouth daily.      Dellis Anes IN Inhalation Inhale into the lungs as directed.      [name] DILTIAZEM HCL ER 180 MG PO CP24 Oral Take 1 capsule (180 mg total) by mouth daily. 90 capsule 3  . GABAPENTIN 300 MG PO CAPS Oral Take 300 mg by mouth 3 (three) times daily.      . INSULIN GLARGINE 100 UNIT/ML  SOLN Subcutaneous Inject 80 Units into the skin at bedtime.      [name] METFORMIN HCL 500 MG PO TABS Oral Take 500 mg by mouth 2 (two) times daily with a meal.      . PRASUGREL HCL 10 MG PO TABS Oral Take 10 mg by mouth daily.      [name] PRAVASTATIN SODIUM 10 MG PO TABS Oral Take 1 tablet (10 mg total) by mouth daily. 90 tablet 3  . RAMIPRIL 5 MG PO TABS Oral Take 5 mg by mouth daily.     [name] VALSARTAN 320 MG PO TABS Oral Take 1 tablet (320 mg total) by mouth daily. 90 tablet 3    BP 176/87  Pulse 83  Resp 19  Ht  6\' 1"  (1.854 m)  Wt 242 lb (109.77 kg)  BMI 31.93 kg/m2  SpO2 98%  Physical Exam  Nursing note and vitals reviewed. Constitutional: He appears well-developed and well-nourished. No distress.  HENT:  Head: Normocephalic and atraumatic.  Right Ear: External ear normal.  Left Ear: External ear normal.  Eyes: Conjunctivae are normal. Right eye exhibits no discharge. Left eye exhibits no discharge. No scleral icterus.  Neck: Neck supple. No tracheal deviation present.  Cardiovascular: Normal rate, regular rhythm and intact distal pulses.   Pulmonary/Chest: Effort normal and breath sounds normal. No stridor. No respiratory distress. He has no wheezes. He has no rales. He exhibits no tenderness.       No chest wall tenderness noted.   Abdominal: Soft. Bowel sounds are normal. He exhibits no distension. There is no tenderness. There is no rebound and no guarding.  Musculoskeletal: He exhibits no edema and no  tenderness.  Neurological: He is alert. He has normal strength. No sensory deficit. Cranial nerve deficit:  no gross defecits noted. He exhibits normal muscle tone. He displays no seizure activity. Coordination normal.  Skin: Skin is warm and dry. No rash noted.  Psychiatric: He has a normal mood and affect.    ED Course  Procedures (including critical care time)  DIAGNOSTIC STUDIES: Oxygen Saturation is 97% on room air, normal by my interpretation.    COORDINATION OF CARE: 8:58AM- Patient informed of current plan for treatment and evaluation and agrees with plan at this time.    Date: 10/08/2011  Rate: 86  Rhythm: normal sinus rhythm  QRS Axis: normal  Intervals: normal  ST/T Wave abnormalities: normal  Conduction Disutrbances:incomplete right bundle branch block  Narrative Interpretation:   Old EKG Reviewed: none available     Labs Reviewed  CBC - Abnormal; Notable for the following:    HCT 38.4 (*)    MCHC 36.2 (*)    All other components within normal limits  BASIC METABOLIC PANEL - Abnormal; Notable for the following:    Sodium 132 (*)    Glucose, Bld 410 (*)    All other components within normal limits  POCT I-STAT TROPONIN I   Chest Portable 1 View  10/08/2011  *RADIOLOGY REPORT*  Clinical Data: left chest pain, shortness of breath.  PORTABLE CHEST - 1 VIEW  Comparison: 08/22/2011  Findings: Low lung volumes. Heart and mediastinal contours are within normal limits.  No focal opacities or effusions.  No acute bony abnormality.  IMPRESSION: No active cardiopulmonary disease.  Original Report Authenticated By: Raelyn Number, M.D.      MDM   Patient has history of coronary artery disease with stenting last year. Patient states the pain is not similar to his prior coronary artery disease been concerned about this component of his discomfort that increases with exertion. The symptoms at this point having gone on for at least 3 weeks. There is no evidence of acute  ischemia here in the emergency department. Considering the prolonged duration the fact the patient feels this is different from his prior cardiac symptoms do not feel that he needs to be admitted at this time. I discussed the case with Dr. Lattie Haw and he is arranged for the patient to have a stress test this week on Wednesday.the patient has been encouraged to return to emergency room for any worsening symptoms. He should continue his current medications this time.     I personally performed the services described in this documentation, which was scribed in  my presence.  The recorded information has been reviewed and considered.    Kathalene Frames, MD 10/08/11 [phone]

## 2011-10-09 ENCOUNTER — Other Ambulatory Visit: Payer: Self-pay | Admitting: *Deleted

## 2011-10-10 ENCOUNTER — Encounter (HOSPITAL_COMMUNITY)
Admission: RE | Admit: 2011-10-10 | Discharge: 2011-10-10 | Disposition: A | Discharge status: Home / Self Care | Payer: PRIVATE HEALTH INSURANCE | Source: Ambulatory Visit | Attending: Cardiology | Admitting: Cardiology

## 2011-10-10 ENCOUNTER — Ambulatory Visit (INDEPENDENT_AMBULATORY_CARE_PROVIDER_SITE_OTHER): Payer: PRIVATE HEALTH INSURANCE | Admitting: *Deleted

## 2011-10-10 ENCOUNTER — Encounter (HOSPITAL_COMMUNITY): Payer: Self-pay

## 2011-10-10 ENCOUNTER — Encounter (HOSPITAL_COMMUNITY): Payer: Self-pay | Admitting: Cardiology

## 2011-10-10 DIAGNOSIS — R079 Chest pain, unspecified: Secondary | ICD-10-CM

## 2011-10-10 DIAGNOSIS — I2510 Atherosclerotic heart disease of native coronary artery without angina pectoris: Secondary | ICD-10-CM

## 2011-10-10 MED ORDER — TECHNETIUM TC 99M TETROFOSMIN IV KIT
10.0000 | PACK | Freq: Once | INTRAVENOUS | Status: AC | PRN
  Administered 2011-10-10: 10.85 via INTRAVENOUS

## 2011-10-10 MED ORDER — TECHNETIUM TC 99M TETROFOSMIN IV KIT
30.0000 | PACK | Freq: Once | INTRAVENOUS | Status: AC | PRN
  Administered 2011-10-10: 30 via INTRAVENOUS

## 2011-10-10 NOTE — Progress Notes (Signed)
Stress Lab Nurses Notes - [place] 10/10/2011  Reason for doing test: CAD and Chest Pain Type of test: Test Changed to lexiscan. Unable to reach THR. Fatigue in legs.& SOB.  Nurse performing test: Gerrit Halls, RN Nuclear Medicine Tech: Dyanne Carrel Echo Tech: Not Applicable MD performing test: R. Rothbart Family MD: Luan Pulling Test explained and consent signed: yes IV started: 22g jelco, Saline lock flushed, No redness or edema and Saline lock started in radiology Symptoms: Fatigue in legs & SOB.[name] Having some chest pressure also during recovery & resolved. Treatment/Intervention: None Reason test stopped: protocol completed After recovery IV was: Discontinued via X-ray tech and No redness or edema Patient to return to [facility]. Med at :  11:45 Patient discharged: Home Patient's Condition upon discharge was: stable Comments: During test BP 152/78 & HR 116.  Recovery BP 128/72 & HR 85.  Symptoms resolved in recovery. Geanie Cooley T

## 2011-10-15 ENCOUNTER — Encounter: Payer: Self-pay | Admitting: *Deleted

## 2011-10-24 ENCOUNTER — Ambulatory Visit: Payer: Self-pay | Admitting: Cardiology

## 2011-10-24 ENCOUNTER — Encounter: Payer: Self-pay | Admitting: Cardiology

## 2011-10-24 ENCOUNTER — Ambulatory Visit (INDEPENDENT_AMBULATORY_CARE_PROVIDER_SITE_OTHER): Payer: PRIVATE HEALTH INSURANCE | Admitting: Cardiology

## 2011-10-24 ENCOUNTER — Ambulatory Visit: Payer: BC Managed Care – PPO | Admitting: Cardiology

## 2011-10-24 ENCOUNTER — Encounter: Payer: Self-pay | Admitting: *Deleted

## 2011-10-24 DIAGNOSIS — G609 Hereditary and idiopathic neuropathy, unspecified: Secondary | ICD-10-CM

## 2011-10-24 DIAGNOSIS — N529 Male erectile dysfunction, unspecified: Secondary | ICD-10-CM | POA: Insufficient documentation

## 2011-10-24 DIAGNOSIS — I2510 Atherosclerotic heart disease of native coronary artery without angina pectoris: Secondary | ICD-10-CM

## 2011-10-24 DIAGNOSIS — K8020 Calculus of gallbladder without cholecystitis without obstruction: Secondary | ICD-10-CM

## 2011-10-24 DIAGNOSIS — G629 Polyneuropathy, unspecified: Secondary | ICD-10-CM

## 2011-10-24 DIAGNOSIS — I10 Essential (primary) hypertension: Secondary | ICD-10-CM

## 2011-10-24 DIAGNOSIS — E871 Hypo-osmolality and hyponatremia: Secondary | ICD-10-CM

## 2011-10-24 DIAGNOSIS — E785 Hyperlipidemia, unspecified: Secondary | ICD-10-CM

## 2011-10-24 DIAGNOSIS — E782 Mixed hyperlipidemia: Secondary | ICD-10-CM

## 2011-10-24 MED ORDER — VALSARTAN-HYDROCHLOROTHIAZIDE 320-12.5 MG PO TABS: 1.0000 | ORAL_TABLET | Freq: Every day | ORAL | Status: DC

## 2011-10-24 NOTE — Patient Instructions (Signed)
Your physician recommends that you schedule a follow-up appointment in: Nurse visit in 2 months/ Rothbart in  6 months  Your physician has requested that you regularly monitor and record your blood pressure readings at home. Please use the same machine at the same time of day to check your readings and record them to bring to your follow-up visit.  Your physician recommends that you return for lab work in: 1 month  Your physician has recommended you make the following change in your medication:  1 - STOP Pravachol 2 - STOP Valsartan after you finish current bottle then START Valsartan/HCT 320/12.5 3 - Cialis 20 mg 30 minutes prior to sexual activity - Call if this medication is appropriate for you and we will call you in a prescription.

## 2011-10-24 NOTE — Assessment & Plan Note (Addendum)
Control of hypertension is improved.  Hydrochlorothiazide 12.5 mg per day will be added to his regime with continuing monitoring of blood pressure at home and return visit with cardiology nurses in 2 months.  Chemistry profile and lipid profile will be assessed in one month.  Potential benefit of continuing weight loss on blood pressure control discussed with patient.  He is encouraged to remain as active as possible and to continue caloric restriction.

## 2011-10-24 NOTE — Assessment & Plan Note (Signed)
Risk of repetitive trauma or pressure in the lower extremities discussed with patient and suggestions provided as to how to avoid this as well as the need for him to monitor the condition of his skin on a daily basis.

## 2011-10-24 NOTE — Assessment & Plan Note (Signed)
Patient has moderate 2-vessel disease following recent percutaneous intervention, but residual stenoses are not favorable for further revascularization.  Recent stress nuclear study revealed poor exercise capacity but no myocardial ischemia.  We will continue to optimally manage cardiovascular risk factors as feasible.

## 2011-10-24 NOTE — Progress Notes (Signed)
Patient ID: Eugene [name], male   DOB: Mar 20, 1956, 56 y.o.   MRN: [id number] HPI: Scheduled return visit for this very nice gentleman with hypertension and diabetes.  He continues to note dyspnea with exertion, but is able to perform a significant amount of work if he rests intermittently.  He has had no chest discomfort, palpitations, lightheadedness nor syncope.  He continues to have absent sensation in the lower extremities and recently developed mild traumatic injury of his knees when he was doing work around the house.  Blood pressure values at home have been better, but not optimal.  Patient reports mild intermittent bilateral pedal edema, which is not otherwise causing symptoms.  Prior to Admission medications   Medication Sig Start Date End Date Taking? Authorizing Provider  aspirin EC 81 MG tablet Take 81 mg by mouth daily.   Yes Historical Provider, MD  budesonide-formoterol (SYMBICORT) 80-4.5 MCG/ACT inhaler Inhale 2 puffs into the lungs 2 (two) times daily.   Yes Historical Provider, MD  diltiazem (DILACOR XR) 180 MG 24 hr capsule Take 1 capsule (180 mg total) by mouth daily. 08/23/11 08/22/12 Yes [name] Estimable. Orelia Brandstetter, MD  prasugrel (EFFIENT) 10 MG TABS Take 10 mg by mouth daily.     Yes Historical Provider, MD  valsartan (DIOVAN) 320 MG tablet Take 1 tablet (320 mg total) by mouth daily. 08/23/11 08/22/12 Yes [name] Estimable. Lattie Haw, MD   No Known Allergies    Past medical history, social history, and family history reviewed and updated.  ROS: See history of present illness.  PHYSICAL EXAM: BP 147/86  Pulse 92  Resp 18  Ht 6\' 1"  (1.854 m)  Wt 108.41 kg (239 lb)  BMI 31.53 kg/m2.  Decrease of 3 pounds since his last visit.  General-Well developed; no acute distress Body habitus-Overweight Neck-No JVD; no carotid bruits Lungs-clear lung fields; resonant to percussion Cardiovascular-normal PMI; normal S1 and S2 Abdomen-normal bowel sounds; soft and non-tender without masses or  organomegaly Musculoskeletal-No deformities, no cyanosis or clubbing Neurologic-Normal cranial nerves; symmetric strength and tone Skin-Warm, no significant lesions; Mild abrasions over both knees Extremities-distal pulses intact; 1/2+edema  ASSESSMENT AND PLAN:  Jacqulyn Ducking, MD 10/24/2011 2:09 PM

## 2011-10-24 NOTE — Assessment & Plan Note (Signed)
Trial of Cialis samples provided with prescription if efficacious.

## 2011-10-24 NOTE — Assessment & Plan Note (Signed)
Patient did not tolerate even minimal doses of pravastatin.  He developed aching discomfort in both forearms that was immediately relieved when he discontinued the medication.  It apparently will not be feasible to treat him with a statin.  Use of other agents is fairly speculative in terms of potential benefit.  A lipid profile will be reassessed and a decision made as to whether pharmacologic therapy will be necessary.

## 2011-10-28 ENCOUNTER — Emergency Department (HOSPITAL_COMMUNITY)
Admission: EM | Admit: 2011-10-28 | Discharge: 2011-10-28 | Disposition: A | Discharge status: Home / Self Care | Payer: PRIVATE HEALTH INSURANCE | Attending: Emergency Medicine | Admitting: Emergency Medicine

## 2011-10-28 ENCOUNTER — Encounter (HOSPITAL_COMMUNITY): Payer: Self-pay | Admitting: Emergency Medicine

## 2011-10-28 ENCOUNTER — Other Ambulatory Visit: Payer: Self-pay

## 2011-10-28 DIAGNOSIS — E785 Hyperlipidemia, unspecified: Secondary | ICD-10-CM | POA: Insufficient documentation

## 2011-10-28 DIAGNOSIS — I10 Essential (primary) hypertension: Secondary | ICD-10-CM | POA: Insufficient documentation

## 2011-10-28 DIAGNOSIS — R0602 Shortness of breath: Secondary | ICD-10-CM | POA: Insufficient documentation

## 2011-10-28 DIAGNOSIS — R1012 Left upper quadrant pain: Secondary | ICD-10-CM | POA: Insufficient documentation

## 2011-10-28 DIAGNOSIS — R739 Hyperglycemia, unspecified: Secondary | ICD-10-CM

## 2011-10-28 DIAGNOSIS — I2510 Atherosclerotic heart disease of native coronary artery without angina pectoris: Secondary | ICD-10-CM | POA: Insufficient documentation

## 2011-10-28 DIAGNOSIS — Z79899 Other long term (current) drug therapy: Secondary | ICD-10-CM | POA: Insufficient documentation

## 2011-10-28 DIAGNOSIS — Z7982 Long term (current) use of aspirin: Secondary | ICD-10-CM | POA: Insufficient documentation

## 2011-10-28 DIAGNOSIS — E119 Type 2 diabetes mellitus without complications: Secondary | ICD-10-CM | POA: Insufficient documentation

## 2011-10-28 LAB — COMPREHENSIVE METABOLIC PANEL
ALT: 20 U/L (ref 0–53)
Albumin: 2.9 g/dL — ABNORMAL LOW (ref 3.5–5.2)
Alkaline Phosphatase: 111 U/L (ref 39–117)
Calcium: 9.6 mg/dL (ref 8.4–10.5)
GFR calc Af Amer: >90 mL/min (ref >90)
Glucose, Bld: 452 mg/dL — ABNORMAL HIGH (ref 70–99)
Potassium: 4.3 mEq/L (ref 3.5–5.1)
Sodium: 127 mEq/L — ABNORMAL LOW (ref 135–145)
Total Protein: 6.2 g/dL (ref 6.0–8.3)

## 2011-10-28 LAB — CBC
HCT: 38.5 % — ABNORMAL LOW (ref 39.0–52.0)
Hemoglobin: 13.9 g/dL (ref 13.0–17.0)
MCHC: 36.1 g/dL — ABNORMAL HIGH (ref 30.0–36.0)
MCV: 86.1 fL (ref 78.0–100.0)
WBC: 5.5 10*3/uL (ref 4.0–10.5)

## 2011-10-28 LAB — DIFFERENTIAL
Eosinophils Relative: 2 % (ref 0–5)
Lymphocytes Relative: 30 % (ref 12–46)
Monocytes Absolute: 0.6 10*3/uL (ref 0.1–1.0)
Monocytes Relative: 10 % (ref 3–12)
Neutro Abs: 3.2 10*3/uL (ref 1.7–7.7)

## 2011-10-28 MED ORDER — INSULIN ASPART 100 UNIT/ML ~~LOC~~ SOLN
10.0000 [IU] | Freq: Once | SUBCUTANEOUS | Status: AC
  Administered 2011-10-28: 10 [IU] via SUBCUTANEOUS

## 2011-10-28 MED ORDER — INSULIN ASPART 100 UNIT/ML ~~LOC~~ SOLN
SUBCUTANEOUS | Status: AC
  Filled 2011-10-28: qty 1

## 2011-10-28 MED ORDER — SODIUM CHLORIDE 0.9 % IV BOLUS (SEPSIS)
1000.0000 mL | Freq: Once | INTRAVENOUS | Status: AC
  Administered 2011-10-28: 1000 mL via INTRAVENOUS

## 2011-10-28 NOTE — ED Notes (Addendum)
Patient complaining of shortness of breath and left upper quadrant abdominal pain; reports his entire stomach feels "tight".  Patient states that he has been having these symptoms for a week; abdominal pain became worse tonight.  Patient states that the pain worsens upon deep inspiration.  Patient denies chest pain, nausea, vomiting, and diarrhea.  Patient recently had stress test; reports no abnormal results.  Patient sleeps with a CPAP machine at night.

## 2011-10-28 NOTE — Discharge Instructions (Signed)
Followup with your primary care doctor in regards to your hospital visit today.  Read the instructions below and reasons to return to the emergency department.  Keep in mind that your blood sugar was high as well as her blood pressure today.  SEEK IMMEDIATE MEDICAL CARE IF:  The pain does not go away.  You have a fever >101 that persists You keep throwing up (vomiting) or cannot drink liquids.  The pain becomes localized (Pain in the right side could possibly be appendicitis. In an adult, pain in the left lower portion of the abdomen could be colitis or diverticulitis). You pass bloody or black tarry stools.  You have shaking chills.  There is blood in your vomit or you see blood in your bowel movements.  Your bowel movements stop (become blocked) or you cannot pass gas.  You have bloody, frequent, or painful urination.  You have yellow discoloration in the skin or whites of the eyes.  Your stomach becomes bloated or bigger.  You have dizziness or fainting.  You have chest or back pain.

## 2011-10-28 NOTE — ED Provider Notes (Signed)
Medical screening examination/treatment/procedure(s) were performed by non-physician practitioner and as supervising physician I was immediately available for consultation/collaboration.   Veryl Speak, MD 10/28/11 [phone]

## 2011-10-28 NOTE — ED Provider Notes (Signed)
History     CSN: [id number]  Arrival date & time 10/28/11  [id number]   First MD Initiated Contact with Patient 10/28/11 0410      Chief Complaint  Patient presents with  . Abdominal Pain  . Shortness of Breath    (Consider location/radiation/quality/duration/timing/severity/associated sxs/prior treatment) HPI Comments: Patient with a history of diabetes CAD with stent placement presents emergency department with a chief complaint of  " it feels like something wants to bust out of my stomach".  He denies any abdominal pain, chest pain, pleurisy, nausea, vomiting or diarrhea.  Patient states when he pushes a certain point in his stomach he becomes slightly short of breath and there is mild pain. Pt denies this occuring from different positions or with a deep breath. Patient denies recent trauma.  In addition patient states that he's been having chronic lung problems for about 6 months.  Patient is seeing a pulmonologist currently at he has an appointment next week for further evaluation of his lungs. Pt denies recent travel, claudication, leg swelling, hx of DVT.       The history is provided by the patient.    Past Medical History  Diagnosis Date  . Arteriosclerotic cardiovascular disease (ASCVD)     Cath in 05/2011: Mild pulmonary hypertension, hyperdynamic LV, 50% LAD, severe first diagonal disease in a small vessel, 80% mid D2 also small, 40% circumflex, 50-60% RCA proximally and 99% distally-DES x2  . Hyperlipidemia   . Diabetes mellitus     Peripheral neuropathy  . Bell palsy   . Nephrolithiasis   . Abnormal cardiac cath   . Hypertension     Past Surgical History  Procedure Date  . Circumcision   . Stents     Family History  Problem Relation Age of Onset  . Diabetes Mother   . Heart attack Mother   . Stroke Mother   . Diabetes Sister   . Sleep apnea Sister   . Hypertension Brother   . Diabetes Brother     History  Substance Use Topics  . Smoking status: Never  Smoker   . Smokeless tobacco: Not on file  . Alcohol Use: No      Review of Systems  Constitutional: Negative for fever, chills and appetite change.  HENT: Negative for congestion.   Eyes: Negative for visual disturbance.  Respiratory: Negative for cough and shortness of breath.   Cardiovascular: Negative for chest pain and leg swelling.  Gastrointestinal: Negative for nausea, vomiting, abdominal pain, diarrhea, constipation, blood in stool, abdominal distention, anal bleeding and rectal pain.  Genitourinary: Negative for dysuria, urgency and frequency.  Neurological: Negative for dizziness, syncope, weakness, light-headedness, numbness and headaches.  Psychiatric/Behavioral: Negative for confusion.  All other systems reviewed and are negative.    Allergies  Review of patient's allergies indicates no known allergies.  Home Medications   Current Outpatient Rx  Name Route Sig Dispense Refill  . ASPIRIN EC 81 MG PO TBEC Oral Take 81 mg by mouth daily.    . BUDESONIDE-FORMOTEROL FUMARATE 160-4.5 MCG/ACT IN AERO Inhalation Inhale 2 puffs into the lungs 2 (two) times daily.    [name] DILTIAZEM HCL ER 180 MG PO CP24 Oral Take 1 capsule (180 mg total) by mouth daily. 90 capsule 3  . PRASUGREL HCL 10 MG PO TABS Oral Take 10 mg by mouth daily.      [name] VALSARTAN 320 MG PO TABS Oral Take 320 mg by mouth daily.      BP 116/72  Pulse 82  Temp(Src) 98.1 F (36.7 C) (Oral)  Resp 15  SpO2 96%  Physical Exam  Nursing note and vitals reviewed. Constitutional: Vital signs are normal. He appears well-developed and well-nourished. No distress.  HENT:  Head: Normocephalic and atraumatic.  Mouth/Throat: Uvula is midline, oropharynx is clear and moist and mucous membranes are normal.  Eyes: Conjunctivae and EOM are normal. Pupils are equal, round, and reactive to light.  Neck: Normal range of motion and full passive range of motion without pain. Neck supple. No spinous process tenderness and no  muscular tenderness present. No rigidity. No Brudzinski's sign noted.  Cardiovascular: Normal rate and regular rhythm.   Pulmonary/Chest: Effort normal and breath sounds normal. No accessory muscle usage. Not tachypneic. No respiratory distress.       Oxygen saturation 98 on room air, no respiratory distress or accessory muscle use.  Bilateral rhonchi in mid lung fields (note patient states he had x-ray last week and is getting worked up further in next week)  Abdominal: Soft. Normal appearance. He exhibits no distension, no ascites, no pulsatile midline mass and no mass. There is tenderness. There is no CVA tenderness. No hernia.       Obese abdomen, soft, normal bowel sounds, Mild ttp of LUQ, pain free with out pressure  Lymphadenopathy:    He has no cervical adenopathy.  Neurological: He is alert.  Skin: Skin is warm and dry. No rash noted. He is not diaphoretic.  Psychiatric: He has a normal mood and affect. His speech is normal and behavior is normal.    ED Course  Procedures (including critical care time)  Labs Reviewed  COMPREHENSIVE METABOLIC PANEL - Abnormal; Notable for the following:    Sodium 127 (*)    Chloride 92 (*)    Glucose, Bld 452 (*)    Albumin 2.9 (*)    All other components within normal limits  CBC - Abnormal; Notable for the following:    HCT 38.5 (*)    MCHC 36.1 (*)    All other components within normal limits  DIFFERENTIAL  LIPASE, BLOOD  TROPONIN I   No results found.   Date: 10/28/2011  Rate: 92  Rhythm: normal sinus rhythm  QRS Axis: normal  Intervals: normal  ST/T Wave abnormalities: normal  Conduction Disutrbances: none  Narrative Interpretation:   Old EKG Reviewed: No significant changes noted     No diagnosis found.    MDM  Hyperglycemia, hypertension Patient hyperglycemia treated in the emergency department with 2 L bolus and 10 units insulin.  Patient advised to followup with his primary care Dr. in regards to his chief  complaint.  Patient has no abdominal pain is hemodynamically stable in no acute distress and is agreeable with plan to discharge.  Patient is also to followup next week with his lung specialist.        Verl Dicker, PA-C 10/28/11 [place], PA-C 10/28/11 [phone]

## 2011-10-28 NOTE — Assessment & Plan Note (Signed)
Sodium of 128 when measured a few days ago.  This is partially due to a concomitant blood sugar of 368.  Continued monitoring will be necessary.

## 2011-11-16 ENCOUNTER — Other Ambulatory Visit: Payer: Self-pay | Admitting: *Deleted

## 2011-11-16 DIAGNOSIS — E782 Mixed hyperlipidemia: Secondary | ICD-10-CM

## 2011-11-20 ENCOUNTER — Encounter: Payer: Self-pay | Admitting: Cardiology

## 2011-11-20 LAB — LIPID PANEL
Cholesterol: 557 mg/dL — ABNORMAL HIGH (ref 0–200)
HDL: 36 mg/dL — ABNORMAL LOW (ref >39)
Total CHOL/HDL Ratio: 15.5 Ratio
Triglycerides: 2695 mg/dL — ABNORMAL HIGH (ref <150)

## 2011-11-20 LAB — COMPREHENSIVE METABOLIC PANEL
AST: 18 U/L (ref 0–37)
Alkaline Phosphatase: 108 U/L (ref 39–117)
BUN: 26 mg/dL — ABNORMAL HIGH (ref 6–23)
Calcium: 9.6 mg/dL (ref 8.4–10.5)
Creat: 0.91 mg/dL (ref 0.50–1.35)
Total Bilirubin: 0.5 mg/dL (ref 0.3–1.2)

## 2011-11-21 ENCOUNTER — Encounter: Payer: Self-pay | Admitting: Cardiology

## 2011-11-22 ENCOUNTER — Encounter: Payer: Self-pay | Admitting: *Deleted

## 2011-11-22 ENCOUNTER — Other Ambulatory Visit: Payer: Self-pay | Admitting: *Deleted

## 2011-11-22 DIAGNOSIS — E782 Mixed hyperlipidemia: Secondary | ICD-10-CM

## 2011-11-22 MED ORDER — COLESEVELAM HCL 625 MG PO TABS: 1250.0000 mg | ORAL_TABLET | Freq: Three times a day (TID) | ORAL | Status: DC

## 2011-11-22 MED ORDER — EZETIMIBE 10 MG PO TABS: 10.0000 mg | ORAL_TABLET | Freq: Every day | ORAL | Status: DC

## 2011-12-20 ENCOUNTER — Other Ambulatory Visit: Payer: Self-pay | Admitting: *Deleted

## 2011-12-20 DIAGNOSIS — E782 Mixed hyperlipidemia: Secondary | ICD-10-CM

## 2011-12-22 LAB — LIPID PANEL
HDL: 38 mg/dL — ABNORMAL LOW (ref >39)
Total CHOL/HDL Ratio: 10.8 Ratio

## 2011-12-24 ENCOUNTER — Encounter: Payer: Self-pay | Admitting: *Deleted

## 2011-12-24 ENCOUNTER — Other Ambulatory Visit: Payer: Self-pay | Admitting: *Deleted

## 2011-12-24 MED ORDER — GEMFIBROZIL 600 MG PO TABS: 600.0000 mg | ORAL_TABLET | Freq: Two times a day (BID) | ORAL | Status: DC

## 2011-12-24 MED ORDER — NIACIN ER (ANTIHYPERLIPIDEMIC) 1000 MG PO TBCR: EXTENDED_RELEASE_TABLET | ORAL | Status: DC

## 2011-12-24 NOTE — Patient Instructions (Signed)
Your physician has recommended you make the following change in your medication:  1 - START Gimfibrozil 600 mg twice a day 2 - START Niaspan 500 mg each evening x 1 week then 1000 mg x 1 week then 2000 mg each evening thereafter  Your physician recommends that you return for lab work in: Cholesterol check in 6 weeks

## 2011-12-25 ENCOUNTER — Ambulatory Visit (INDEPENDENT_AMBULATORY_CARE_PROVIDER_SITE_OTHER): Payer: PRIVATE HEALTH INSURANCE | Admitting: *Deleted

## 2011-12-25 VITALS — BP 150/83 | HR 82

## 2011-12-25 DIAGNOSIS — I10 Essential (primary) hypertension: Secondary | ICD-10-CM

## 2011-12-25 NOTE — Progress Notes (Signed)
Presents for return blood pressure check.  States that he has been experiencing severe muscle aches to arms and feels that it is from his cholesterol medication.  Consulted with Dr Lattie Haw, who states that neither Zetia or Welchol would cause described side effects.  Advised him to contact his PCP regarding muscle aches.  Added statin intolerance to his allergies, as he has had intolerance to Lipitor in the past.  Medication bottles brought to visit and reviewed.  New recommendations regarding additional medication regimen discussed secondary to lab findings.  Verbalized understanding and will recheck lipids in 6 weeks, as requested.  List of blood pressures brought to appointment.   No other complaints noted at this time.

## 2012-01-05 ENCOUNTER — Encounter: Payer: Self-pay | Admitting: Cardiology

## 2012-01-29 ENCOUNTER — Encounter (HOSPITAL_COMMUNITY): Payer: Self-pay | Admitting: *Deleted

## 2012-01-29 ENCOUNTER — Emergency Department (HOSPITAL_COMMUNITY)
Admission: EM | Admit: 2012-01-29 | Discharge: 2012-01-29 | Disposition: A | Discharge status: Home / Self Care | Payer: PRIVATE HEALTH INSURANCE | Attending: Emergency Medicine | Admitting: Emergency Medicine

## 2012-01-29 DIAGNOSIS — L02818 Cutaneous abscess of other sites: Secondary | ICD-10-CM | POA: Insufficient documentation

## 2012-01-29 DIAGNOSIS — I10 Essential (primary) hypertension: Secondary | ICD-10-CM | POA: Insufficient documentation

## 2012-01-29 DIAGNOSIS — Z7982 Long term (current) use of aspirin: Secondary | ICD-10-CM | POA: Insufficient documentation

## 2012-01-29 DIAGNOSIS — L02811 Cutaneous abscess of head [any part, except face]: Secondary | ICD-10-CM

## 2012-01-29 DIAGNOSIS — E119 Type 2 diabetes mellitus without complications: Secondary | ICD-10-CM | POA: Insufficient documentation

## 2012-01-29 DIAGNOSIS — Z79899 Other long term (current) drug therapy: Secondary | ICD-10-CM | POA: Insufficient documentation

## 2012-01-29 DIAGNOSIS — E785 Hyperlipidemia, unspecified: Secondary | ICD-10-CM | POA: Insufficient documentation

## 2012-01-29 DIAGNOSIS — I2510 Atherosclerotic heart disease of native coronary artery without angina pectoris: Secondary | ICD-10-CM | POA: Insufficient documentation

## 2012-01-29 MED ORDER — CEPHALEXIN 500 MG PO CAPS: ORAL_CAPSULE | ORAL | Status: DC

## 2012-01-29 MED ORDER — DOXYCYCLINE HYCLATE 100 MG PO TABS
100.0000 mg | ORAL_TABLET | Freq: Once | ORAL | Status: AC
  Administered 2012-01-29: 100 mg via ORAL
  Filled 2012-01-29: qty 1

## 2012-01-29 MED ORDER — CEFIXIME 400 MG PO TABS
400.0000 mg | ORAL_TABLET | Freq: Once | ORAL | Status: AC
  Administered 2012-01-29: 400 mg via ORAL
  Filled 2012-01-29: qty 1

## 2012-01-29 MED ORDER — ONDANSETRON HCL 4 MG PO TABS
4.0000 mg | ORAL_TABLET | Freq: Once | ORAL | Status: AC
  Administered 2012-01-29: 4 mg via ORAL
  Filled 2012-01-29: qty 1

## 2012-01-29 MED ORDER — DOXYCYCLINE HYCLATE 100 MG PO CAPS: 100.0000 mg | ORAL_CAPSULE | Freq: Two times a day (BID) | ORAL | Status: AC

## 2012-01-29 MED ORDER — HYDROCODONE-ACETAMINOPHEN 7.5-325 MG PO TABS: 1.0000 | ORAL_TABLET | ORAL | Status: AC | PRN

## 2012-01-29 NOTE — ED Provider Notes (Signed)
History     CSN: [id number]  Arrival date & time 01/29/12  1717   First MD Initiated Contact with Patient 01/29/12 1754      Chief Complaint  Patient presents with  . Abscess    (Consider location/radiation/quality/duration/timing/severity/associated sxs/prior treatment) Patient is a 56 y.o. male presenting with abscess. The history is provided by the patient.  Abscess  This is a new problem. The current episode started less than one week ago. The onset was gradual. The problem occurs continuously. The problem has been gradually worsening. The abscess is present on the scalp. The problem is moderate. The abscess is characterized by redness and painfulness. It is unknown what he was exposed to. Associated symptoms include decreased sleep. Pertinent negatives include no fever, no vomiting and no cough. His past medical history does not include skin abscesses in family. There were no sick contacts. He has received no recent medical care.    Past Medical History  Diagnosis Date  . Arteriosclerotic cardiovascular disease (ASCVD)     Cath in 05/2011: Mild pulmonary hypertension, hyperdynamic LV, 50% LAD, severe first diagonal disease in a small vessel, 80% mid D2 also small, 40% circumflex, 50-60% RCA proximally and 99% distally-DES x2  . Hyperlipidemia   . Diabetes mellitus     Peripheral neuropathy  . Bell palsy   . Nephrolithiasis   . Abnormal cardiac cath   . Hypertension     Past Surgical History  Procedure Date  . Circumcision   . Stents     Family History  Problem Relation Age of Onset  . Diabetes Mother   . Heart attack Mother   . Stroke Mother   . Diabetes Sister   . Sleep apnea Sister   . Hypertension Brother   . Diabetes Brother     History  Substance Use Topics  . Smoking status: Never Smoker   . Smokeless tobacco: Not on file  . Alcohol Use: No      Review of Systems  Constitutional: Negative for fever and activity change.       All ROS Neg except as  noted in HPI  HENT: Negative for nosebleeds and neck pain.   Eyes: Negative for photophobia and discharge.  Respiratory: Negative for cough, shortness of breath and wheezing.   Cardiovascular: Positive for chest pain. Negative for palpitations.  Gastrointestinal: Negative for vomiting, abdominal pain and blood in stool.  Genitourinary: Negative for dysuria, frequency and hematuria.  Musculoskeletal: Negative for back pain and arthralgias.  Skin: Negative.   Neurological: Positive for weakness. Negative for dizziness, seizures and speech difficulty.  Psychiatric/Behavioral: Negative for hallucinations and confusion.    Allergies  Statins  Home Medications   Current Outpatient Rx  Name Route Sig Dispense Refill  . ASPIRIN EC 81 MG PO TBEC Oral Take 81 mg by mouth daily.    . BUDESONIDE-FORMOTEROL FUMARATE 160-4.5 MCG/ACT IN AERO Inhalation Inhale 2 puffs into the lungs 2 (two) times daily.    . COLESEVELAM HCL 625 MG PO TABS Oral Take 2 tablets (1,250 mg total) by mouth 3 (three) times daily with meals. 540 tablet 3  . DILTIAZEM HCL ER 180 MG PO CP24 Oral Take 1 capsule (180 mg total) by mouth daily. 90 capsule 3  . EZETIMIBE 10 MG PO TABS Oral Take 1 tablet (10 mg total) by mouth daily. 90 tablet 3    DO NOT FILL UNTIL PATIENT REQUESTS FOR IT TO BE FI .[name].  . GEMFIBROZIL 600 MG PO TABS Oral  Take 1 tablet (600 mg total) by mouth 2 (two) times daily before a meal. 60 tablet 12  . NIACIN ER (ANTIHYPERLIPIDEMIC) 1000 MG PO TBCR  Take 2 tablets every night 60 tablet 12  . PRASUGREL HCL 10 MG PO TABS Oral Take 10 mg by mouth daily.      [name] VALSARTAN 320 MG PO TABS Oral Take 320 mg by mouth daily.      BP 113/66  Pulse 92  Temp(Src) 98.3 F (36.8 C) (Oral)  Resp 18  SpO2 100%  Physical Exam  Nursing note and vitals reviewed. Constitutional: He is oriented to person, place, and time. He appears well-developed and well-nourished.  Non-toxic appearance.  HENT:  Head: Normocephalic.    Right Ear: Tympanic membrane and external ear normal.  Left Ear: Tympanic membrane and external ear normal.       Small abscess of the left scalp with increase redness present. No ear or neck involvement.  Eyes: EOM and lids are normal. Pupils are equal, round, and reactive to light.  Neck: Normal range of motion. Neck supple. Carotid bruit is not present.  Cardiovascular: Normal rate, regular rhythm, normal heart sounds, intact distal pulses and normal pulses.   Pulmonary/Chest: Breath sounds normal. No respiratory distress.  Abdominal: Soft. Bowel sounds are normal. There is no tenderness. There is no guarding.  Musculoskeletal: Normal range of motion.  Lymphadenopathy:       Head (right side): No submandibular adenopathy present.       Head (left side): No submandibular adenopathy present.    He has no cervical adenopathy.  Neurological: He is alert and oriented to person, place, and time. He has normal strength. No cranial nerve deficit or sensory deficit.  Skin: Skin is warm and dry.  Psychiatric: He has a normal mood and affect. His speech is normal.    ED Course  Procedures: ASPIRATION ABSCESS - wound to the scalp was cleansed with alcohol. It was then infiltrated with 0.5% bupivacaine. Wound aspirated with an 18-gauge needle, with small amount of pus like material present. The patient is on blood thinners and pressure had to be applied for approximately 6 minutes. After bleeding stopped, sterile dressing applied. Culture sent to the lab. Patient tolerated the procedure without problem. Patient was identified with ID bracelet. Permission for the procedure was given by the patient. Time out was taken before the procedure started.  Labs Reviewed - No data to display No results found.   No diagnosis found.    MDM  I have reviewed nursing notes, vital signs, and all appropriate lab and imaging results for this patient. Patient is diabetic, and has an abscess of the left scalp. The  wound was aspirated,  And culture sent to the lab. Prescription for doxycycline 100 mg twice a day, and Keflex 500 mg 4 times a day given to the patient. Patient also given Norco 7.5 #20. Patient advised to return if not improving.       Lenox Ahr, [place] 01/29/12 [phone]

## 2012-01-29 NOTE — Discharge Instructions (Signed)
Please use keflex four time daily, doxycycline 2times daily. Take both with food. Use tylenol for mild pain. Use Norco for more severe pain. This may cause drowsiness and constipation, use with caution. Please return if not improving.Abscess An abscess (boil or furuncle) is an infected area under your skin. This area is filled with yellowish white fluid (pus). HOME CARE   Only take medicine as told by your doctor.   Keep the skin clean around your abscess. Keep clothes that may touch the abscess clean.   Change any bandages (dressings) as told by your doctor.   Avoid direct skin contact with other people. The infection can spread by skin contact with others.   Practice good hygiene and do not share personal care items.   Do not share athletic equipment, towels, or whirlpools. Shower after every practice or work out session.   If a draining area cannot be covered:   Do not play sports.   Children should not go to daycare until the wound has healed or until fluid (drainage) stops coming out of the wound.   See your doctor for a follow-up visit as told.  GET HELP RIGHT AWAY IF:   There is more pain, puffiness (swelling), and redness in the wound site.   There is fluid or bleeding from the wound site.   You have muscle aches, chills, fever, or feel sick.   You or your child has a temperature by mouth above 102 F (38.9 C), not controlled by medicine.   Your baby is older than 3 months with a rectal temperature of 102 F (38.9 C) or higher.  MAKE SURE YOU:   Understand these instructions.   Will watch your condition.   Will get help right away if you are not doing well or get worse.  Document Released: 01/30/2008 Document Revised: 08/02/2011 Document Reviewed: 01/30/2008 [facility] Patient Information 2012 [place].

## 2012-01-29 NOTE — ED Provider Notes (Signed)
Medical screening examination/treatment/procedure(s) were performed by non-physician practitioner and as supervising physician I was immediately available for consultation/collaboration.   Ezequiel Essex, MD 01/29/12 [id number]

## 2012-01-29 NOTE — ED Notes (Signed)
Abscess to top of head x 3 days.  Reports attempted to drain x 2 days ago.

## 2012-01-30 NOTE — ED Notes (Signed)
Lab unable to insert specimen, chart pulled up and order released.

## 2012-01-31 ENCOUNTER — Encounter (HOSPITAL_COMMUNITY): Payer: Self-pay | Admitting: Emergency Medicine

## 2012-01-31 ENCOUNTER — Emergency Department (HOSPITAL_COMMUNITY)
Admission: EM | Admit: 2012-01-31 | Discharge: 2012-01-31 | Disposition: A | Discharge status: Home / Self Care | Payer: PRIVATE HEALTH INSURANCE | Attending: Emergency Medicine | Admitting: Emergency Medicine

## 2012-01-31 DIAGNOSIS — I10 Essential (primary) hypertension: Secondary | ICD-10-CM | POA: Insufficient documentation

## 2012-01-31 DIAGNOSIS — E1142 Type 2 diabetes mellitus with diabetic polyneuropathy: Secondary | ICD-10-CM | POA: Insufficient documentation

## 2012-01-31 DIAGNOSIS — E1149 Type 2 diabetes mellitus with other diabetic neurological complication: Secondary | ICD-10-CM | POA: Insufficient documentation

## 2012-01-31 DIAGNOSIS — R112 Nausea with vomiting, unspecified: Secondary | ICD-10-CM | POA: Insufficient documentation

## 2012-01-31 DIAGNOSIS — L02818 Cutaneous abscess of other sites: Secondary | ICD-10-CM | POA: Insufficient documentation

## 2012-01-31 DIAGNOSIS — I2510 Atherosclerotic heart disease of native coronary artery without angina pectoris: Secondary | ICD-10-CM | POA: Insufficient documentation

## 2012-01-31 DIAGNOSIS — L0291 Cutaneous abscess, unspecified: Secondary | ICD-10-CM

## 2012-01-31 DIAGNOSIS — E785 Hyperlipidemia, unspecified: Secondary | ICD-10-CM | POA: Insufficient documentation

## 2012-01-31 MED ORDER — PROMETHAZINE HCL 25 MG RE SUPP: 25.0000 mg | Freq: Four times a day (QID) | RECTAL | Status: DC | PRN

## 2012-01-31 MED ORDER — OXYCODONE-ACETAMINOPHEN 5-325 MG PO TABS: 2.0000 | ORAL_TABLET | ORAL | Status: AC | PRN

## 2012-01-31 MED ORDER — ONDANSETRON 8 MG PO TBDP
8.0000 mg | ORAL_TABLET | Freq: Once | ORAL | Status: DC
  Filled 2012-01-31: qty 1

## 2012-01-31 MED ORDER — ONDANSETRON HCL 4 MG/2ML IJ SOLN
4.0000 mg | Freq: Once | INTRAMUSCULAR | Status: AC
  Administered 2012-01-31: 4 mg via INTRAVENOUS
  Filled 2012-01-31: qty 2

## 2012-01-31 MED ORDER — PROMETHAZINE HCL 25 MG PO TABS: 12.5000 mg | ORAL_TABLET | Freq: Four times a day (QID) | ORAL | Status: DC | PRN

## 2012-01-31 MED ORDER — DEXTROSE 5 % IV SOLN
1.0000 g | Freq: Once | INTRAVENOUS | Status: AC
  Administered 2012-01-31: 1 g via INTRAVENOUS
  Filled 2012-01-31: qty 10

## 2012-01-31 MED ORDER — OXYCODONE-ACETAMINOPHEN 5-325 MG PO TABS
2.0000 | ORAL_TABLET | Freq: Once | ORAL | Status: DC
  Filled 2012-01-31: qty 2

## 2012-01-31 MED ORDER — VANCOMYCIN HCL IN DEXTROSE 1-5 GM/200ML-% IV SOLN
1000.0000 mg | Freq: Once | INTRAVENOUS | Status: AC
  Administered 2012-01-31: 1000 mg via INTRAVENOUS
  Filled 2012-01-31: qty 200

## 2012-01-31 MED ORDER — HYDROMORPHONE HCL PF 1 MG/ML IJ SOLN
1.0000 mg | Freq: Once | INTRAMUSCULAR | Status: AC
  Administered 2012-01-31: 1 mg via INTRAVENOUS
  Filled 2012-01-31: qty 1

## 2012-01-31 MED ORDER — SODIUM CHLORIDE 0.9 % IV BOLUS (SEPSIS)
500.0000 mL | Freq: Once | INTRAVENOUS | Status: AC
  Administered 2012-01-31: 500 mL via INTRAVENOUS

## 2012-01-31 NOTE — ED Provider Notes (Signed)
History     CSN: [id number]  Arrival date & time 01/31/12  0421   First MD Initiated Contact with Patient 01/31/12 0440      Chief Complaint  Patient presents with  . Abscess    (Consider location/radiation/quality/duration/timing/severity/associated sxs/prior treatment) HPI  Eugene [name] is a 56 y.o. male who presents to the Emergency Department complaining of nausea and vomiting since having an abscess aspirated on 01/29/2012 and started on antibiotics and analgesics. He has been unable to take take either the antibiotic or the analgesic due to vomiting.   PCP Dr. [name]  Past Medical History  Diagnosis Date  . Arteriosclerotic cardiovascular disease (ASCVD)     Cath in 05/2011: Mild pulmonary hypertension, hyperdynamic LV, 50% LAD, severe first diagonal disease in a small vessel, 80% mid D2 also small, 40% circumflex, 50-60% RCA proximally and 99% distally-DES x2  . Hyperlipidemia   . Diabetes mellitus     Peripheral neuropathy  . Bell palsy   . Nephrolithiasis   . Abnormal cardiac cath   . Hypertension     Past Surgical History  Procedure Date  . Circumcision   . Stents     Family History  Problem Relation Age of Onset  . Diabetes Mother   . Heart attack Mother   . Stroke Mother   . Diabetes Sister   . Sleep apnea Sister   . Hypertension Brother   . Diabetes Brother     History  Substance Use Topics  . Smoking status: Never Smoker   . Smokeless tobacco: Not on file  . Alcohol Use: No      Review of Systems  Constitutional: Negative for fever.       10 Systems reviewed and are negative for acute change except as noted in the HPI.  HENT: Negative for congestion.   Eyes: Negative for discharge and redness.  Respiratory: Negative for cough and shortness of breath.   Cardiovascular: Negative for chest pain.  Gastrointestinal: Positive for nausea and vomiting. Negative for abdominal pain.  Musculoskeletal: Negative for back pain.  Skin: Negative  for rash.       Pain at abscess site  Neurological: Negative for syncope, numbness and headaches.  Psychiatric/Behavioral:       No behavior change.    Allergies  Hydrocodone and Statins  Home Medications   Current Outpatient Rx  Name Route Sig Dispense Refill  . ASPIRIN EC 81 MG PO TBEC Oral Take 81 mg by mouth daily.    . BUDESONIDE-FORMOTEROL FUMARATE 160-4.5 MCG/ACT IN AERO Inhalation Inhale 2 puffs into the lungs 2 (two) times daily.    . CEPHALEXIN 500 MG PO CAPS  1 po qid with food 28 capsule 0  . COLESEVELAM HCL 625 MG PO TABS Oral Take 2 tablets (1,250 mg total) by mouth 3 (three) times daily with meals. 540 tablet 3  . DILTIAZEM HCL ER 180 MG PO CP24 Oral Take 1 capsule (180 mg total) by mouth daily. 90 capsule 3  . DOXYCYCLINE HYCLATE 100 MG PO CAPS Oral Take 1 capsule (100 mg total) by mouth 2 (two) times daily. 14 capsule 0  . EZETIMIBE 10 MG PO TABS Oral Take 1 tablet (10 mg total) by mouth daily. 90 tablet 3    DO NOT FILL UNTIL PATIENT REQUESTS FOR IT TO BE FI .[name].  . GEMFIBROZIL 600 MG PO TABS Oral Take 1 tablet (600 mg total) by mouth 2 (two) times daily before a meal. 60 tablet 12  .  HYDROCODONE-ACETAMINOPHEN 7.5-325 MG PO TABS Oral Take 1 tablet by mouth every 4 (four) hours as needed for pain. 20 tablet 0  . NIACIN ER (ANTIHYPERLIPIDEMIC) 1000 MG PO TBCR  Take 2 tablets every night 60 tablet 12  . PRASUGREL HCL 10 MG PO TABS Oral Take 10 mg by mouth daily.      [name] VALSARTAN 320 MG PO TABS Oral Take 320 mg by mouth daily.      BP 151/83  Pulse 94  Temp(Src) 98.2 F (36.8 C) (Oral)  Resp 18  Ht 6\' 1"  (1.854 m)  Wt 242 lb (109.77 kg)  BMI 31.93 kg/m2  SpO2 99%  Physical Exam  Nursing note and vitals reviewed. Constitutional: He appears well-developed and well-nourished. No distress.       Awake, alert, nontoxic appearance.  HENT:  Head: Normocephalic.  Eyes: Right eye exhibits no discharge. Left eye exhibits no discharge.  Neck: Neck supple.    Cardiovascular: Normal rate, normal heart sounds and intact distal pulses.   Pulmonary/Chest: Effort normal. He exhibits no tenderness.  Abdominal: Soft. There is no tenderness. There is no rebound.  Musculoskeletal: He exhibits no tenderness.       Baseline ROM, no obvious new focal weakness.  Neurological:       Mental status and motor strength appears baseline for patient and situation.  Skin: No rash noted.       Site of healing abscess to left parieto-occipital area with surrounding erythema and tenderness. No fluctuance.  Psychiatric: He has a normal mood and affect.    ED Course  Procedures (including critical care time)  0600 Feeling better. Pain has resolved. Awaiting antibiotic infusion to complete discharge.   MDM   Patient with recent aspiration of an abscess to the scalp is here with nausea and vomiting unable to take either his antibiotics or analgesics. Given IV fluids, IV antibiotics, analgesics, antiemetics. Patient is feeling better. He has been able to take by mouth fluids. Pt feels improved after observation and/or treatment in ED.Pt stable in ED with no significant deterioration in condition.The patient appears reasonably screened and/or stabilized for discharge and I doubt any other medical condition or other [facility] requiring further screening, evaluation, or treatment in the ED at this time prior to discharge.  MDM Reviewed: nursing note and vitals          Gypsy Balsam. Olin Hauser, MD 01/31/12 [phone]

## 2012-01-31 NOTE — ED Notes (Signed)
Patient states he was treated here for an abscess on back of head and was given antibiotics and pain medication. Patient unable to take medication due to being sick from pain medication. Patient also complaining of numbness on left side of head.

## 2012-01-31 NOTE — ED Notes (Signed)
Patient's iv vancomycin is finishing, patient is to be discharged afterwards.

## 2012-01-31 NOTE — Discharge Instructions (Signed)
Take 1/2 to 1/4 nausea pill with your pain medicine. It will keep you from getting sick and allow the pain medicine to work. Be sure to have something on your stomach both when you take the antibiotics and when you take the pain medicine.You may apply either warm compresses or ice to the scalp for comfort.

## 2012-02-01 LAB — CULTURE, ROUTINE-ABSCESS: Gram Stain: NONE SEEN

## 2012-02-02 NOTE — ED Notes (Signed)
+  Abscess. Patient treated with Doxycycline. Sensitive to same. Per protocol MD.

## 2012-02-15 ENCOUNTER — Other Ambulatory Visit: Payer: Self-pay | Admitting: *Deleted

## 2012-02-15 ENCOUNTER — Encounter: Payer: Self-pay | Admitting: *Deleted

## 2012-02-15 DIAGNOSIS — E782 Mixed hyperlipidemia: Secondary | ICD-10-CM

## 2012-02-20 ENCOUNTER — Other Ambulatory Visit: Payer: Self-pay | Admitting: *Deleted

## 2012-02-20 MED ORDER — PRASUGREL HCL 10 MG PO TABS: 10.0000 mg | ORAL_TABLET | Freq: Every day | ORAL | Status: DC

## 2012-02-21 ENCOUNTER — Encounter (HOSPITAL_COMMUNITY): Payer: Self-pay

## 2012-02-21 ENCOUNTER — Emergency Department (HOSPITAL_COMMUNITY)
Admission: EM | Admit: 2012-02-21 | Discharge: 2012-02-22 | Disposition: A | Discharge status: Home / Self Care | Payer: PRIVATE HEALTH INSURANCE | Attending: Emergency Medicine | Admitting: Emergency Medicine

## 2012-02-21 DIAGNOSIS — Z79899 Other long term (current) drug therapy: Secondary | ICD-10-CM | POA: Insufficient documentation

## 2012-02-21 DIAGNOSIS — L02811 Cutaneous abscess of head [any part, except face]: Secondary | ICD-10-CM

## 2012-02-21 DIAGNOSIS — E785 Hyperlipidemia, unspecified: Secondary | ICD-10-CM | POA: Insufficient documentation

## 2012-02-21 DIAGNOSIS — I2510 Atherosclerotic heart disease of native coronary artery without angina pectoris: Secondary | ICD-10-CM | POA: Insufficient documentation

## 2012-02-21 DIAGNOSIS — L02818 Cutaneous abscess of other sites: Secondary | ICD-10-CM | POA: Insufficient documentation

## 2012-02-21 DIAGNOSIS — I10 Essential (primary) hypertension: Secondary | ICD-10-CM | POA: Insufficient documentation

## 2012-02-21 DIAGNOSIS — L03818 Cellulitis of other sites: Secondary | ICD-10-CM | POA: Insufficient documentation

## 2012-02-21 MED ORDER — LIDOCAINE-EPINEPHRINE (PF) 2 %-1:200000 IJ SOLN
INTRAMUSCULAR | Status: AC
  Filled 2012-02-21: qty 20

## 2012-02-21 MED ORDER — LIDOCAINE-EPINEPHRINE (PF) 1 %-1:200000 IJ SOLN
INTRAMUSCULAR | Status: AC
  Filled 2012-02-21: qty 10

## 2012-02-21 MED ORDER — LIDOCAINE-EPINEPHRINE 2 %-1:100000 IJ SOLN: 20.0000 mL | Freq: Once | INTRAMUSCULAR | Status: DC

## 2012-02-21 NOTE — ED Notes (Signed)
2 areas noted; both located on top of head one below the area; both areas appear to have been draining

## 2012-02-21 NOTE — ED Notes (Signed)
Was up here the other week for an abscess to the top of my head, it was numbed and drained here and I have a nurse friend that has been draining but a pocket still keeps forming per pt.

## 2012-02-21 NOTE — ED Notes (Signed)
MD at bedside, performing I & D of absess

## 2012-02-22 MED ORDER — MOXIFLOXACIN HCL 400 MG PO TABS: 400.0000 mg | ORAL_TABLET | Freq: Every day | ORAL | Status: AC

## 2012-02-22 MED ORDER — MOXIFLOXACIN HCL 400 MG PO TABS
400.0000 mg | ORAL_TABLET | Freq: Every day | ORAL | Status: DC
  Administered 2012-02-22: 400 mg via ORAL
  Filled 2012-02-22: qty 1

## 2012-02-22 NOTE — ED Notes (Signed)
Pt stable at discharge; Rosezena Sensor he will return Monday for packing removal and recheck; Pt educated on signs to look for in case of infection or worsening sx. Pt states that he will return here if needed

## 2012-02-22 NOTE — Discharge Instructions (Signed)
Abscess An abscess (boil or furuncle) is an infected area that contains a collection of pus.  SYMPTOMS Signs and symptoms of an abscess include pain, tenderness, redness, or hardness. You may feel a moveable soft area under your skin. An abscess can occur anywhere in the body.  TREATMENT  A surgical cut (incision) may be made over your abscess to drain the pus. Gauze may be packed into the space or a drain may be looped through the abscess cavity (pocket). This provides a drain that will allow the cavity to heal from the inside outwards. The abscess may be painful for a few days, but should feel much better if it was drained.  Your abscess, if seen early, may not have localized and may not have been drained. If not, another appointment may be required if it does not get better on its own or with medications. HOME CARE INSTRUCTIONS   Only take over-the-counter or prescription medicines for pain, discomfort, or fever as directed by your caregiver.   Take your antibiotics as directed if they were prescribed. Finish them even if you start to feel better.   Keep the skin and clothes clean around your abscess.   If the abscess was drained, you will need to use gauze dressing to collect any draining pus. Dressings will typically need to be changed 3 or more times a day.   The infection may spread by skin contact with others. Avoid skin contact as much as possible.   Practice good hygiene. This includes regular hand washing, cover any draining skin lesions, and do not share personal care items.   If you participate in sports, do not share athletic equipment, towels, whirlpools, or personal care items. Shower after every practice or tournament.   If a draining area cannot be adequately covered:   Do not participate in sports.   Children should not participate in day care until the wound has healed or drainage stops.   If your caregiver has given you a follow-up appointment, it is very important  to keep that appointment. Not keeping the appointment could result in a much worse infection, chronic or permanent injury, pain, and disability. If there is any problem keeping the appointment, you must call back to this facility for assistance.  SEEK MEDICAL CARE IF:   You develop increased pain, swelling, redness, drainage, or bleeding in the wound site.   You develop signs of generalized infection including muscle aches, chills, fever, or a general ill feeling.   You have an oral temperature above 102 F (38.9 C).  MAKE SURE YOU:   Understand these instructions.   Will watch your condition.   Will get help right away if you are not doing well or get worse.  Document Released: 05/23/2005 Document Revised: 08/02/2011 Document Reviewed: 03/16/2008 [facility] Patient Information 2012 [place].   Take your next dose of the antibiotic tomorrow evening.  Return in 3 days to have your packing removed and for recheck of your wound.

## 2012-02-25 ENCOUNTER — Encounter (HOSPITAL_COMMUNITY): Payer: Self-pay | Admitting: Emergency Medicine

## 2012-02-25 ENCOUNTER — Emergency Department (HOSPITAL_COMMUNITY)
Admission: EM | Admit: 2012-02-25 | Discharge: 2012-02-25 | Disposition: A | Discharge status: Home / Self Care | Payer: PRIVATE HEALTH INSURANCE | Attending: Emergency Medicine | Admitting: Emergency Medicine

## 2012-02-25 DIAGNOSIS — L02811 Cutaneous abscess of head [any part, except face]: Secondary | ICD-10-CM

## 2012-02-25 DIAGNOSIS — Z09 Encounter for follow-up examination after completed treatment for conditions other than malignant neoplasm: Secondary | ICD-10-CM | POA: Insufficient documentation

## 2012-02-25 DIAGNOSIS — Z79899 Other long term (current) drug therapy: Secondary | ICD-10-CM | POA: Insufficient documentation

## 2012-02-25 DIAGNOSIS — I2510 Atherosclerotic heart disease of native coronary artery without angina pectoris: Secondary | ICD-10-CM | POA: Insufficient documentation

## 2012-02-25 DIAGNOSIS — Z7982 Long term (current) use of aspirin: Secondary | ICD-10-CM | POA: Insufficient documentation

## 2012-02-25 DIAGNOSIS — E785 Hyperlipidemia, unspecified: Secondary | ICD-10-CM | POA: Insufficient documentation

## 2012-02-25 DIAGNOSIS — Z5189 Encounter for other specified aftercare: Secondary | ICD-10-CM

## 2012-02-25 DIAGNOSIS — E119 Type 2 diabetes mellitus without complications: Secondary | ICD-10-CM | POA: Insufficient documentation

## 2012-02-25 DIAGNOSIS — I10 Essential (primary) hypertension: Secondary | ICD-10-CM | POA: Insufficient documentation

## 2012-02-25 MED ORDER — CEFTRIAXONE SODIUM 1 G IJ SOLR
1.0000 g | Freq: Once | INTRAMUSCULAR | Status: AC
  Administered 2012-02-25: 1 g via INTRAMUSCULAR
  Filled 2012-02-25: qty 10

## 2012-02-25 MED ORDER — LIDOCAINE HCL (PF) 1 % IJ SOLN
INTRAMUSCULAR | Status: AC
  Administered 2012-02-25: 2.5 mL
  Filled 2012-02-25: qty 5

## 2012-02-25 NOTE — ED Notes (Signed)
Pt here for wound check x 2 to left posterior head.

## 2012-02-25 NOTE — ED Provider Notes (Signed)
History     CSN: [id number]  Arrival date & time 02/21/12  2117   First MD Initiated Contact with Patient 02/21/12 2236      Chief Complaint  Patient presents with  . Abscess    (Consider location/radiation/quality/duration/timing/severity/associated sxs/prior treatment) HPI Comments: Eugene [name] presents with 2 abscesses to his scalp. He has had problems with this for about the past month, and has completed a course of keflex and doxycycline, and at last visit had a needle aspiration done which did not improve the infection. He continues to have pain and swelling.  He has a "nurse friend" who has poked the larger one, which drained a sustantial amount of of pus.  He denies fevers or chills,  No other complaints, he is a diabetic,  Under  Fair control.  The history is provided by the patient.    Past Medical History  Diagnosis Date  . Arteriosclerotic cardiovascular disease (ASCVD)     Cath in 05/2011: Mild pulmonary hypertension, hyperdynamic LV, 50% LAD, severe first diagonal disease in a small vessel, 80% mid D2 also small, 40% circumflex, 50-60% RCA proximally and 99% distally-DES x2  . Hyperlipidemia   . Diabetes mellitus     Peripheral neuropathy  . Bell palsy   . Nephrolithiasis   . Abnormal cardiac cath   . Hypertension     Past Surgical History  Procedure Date  . Circumcision   . Stents     Family History  Problem Relation Age of Onset  . Diabetes Mother   . Heart attack Mother   . Stroke Mother   . Diabetes Sister   . Sleep apnea Sister   . Hypertension Brother   . Diabetes Brother     History  Substance Use Topics  . Smoking status: Never Smoker   . Smokeless tobacco: Not on file  . Alcohol Use: No      Review of Systems  Constitutional: Negative for fever.  HENT: Negative for congestion, sore throat and neck pain.   Eyes: Negative.   Respiratory: Negative for chest tightness and shortness of breath.   Cardiovascular: Negative for  chest pain.  Gastrointestinal: Negative for nausea and abdominal pain.  Genitourinary: Negative.   Musculoskeletal: Negative for joint swelling and arthralgias.  Skin: Positive for wound. Negative for rash.  Neurological: Negative for dizziness, weakness, light-headedness, numbness and headaches.  Hematological: Negative.   Psychiatric/Behavioral: Negative.     Allergies  Hydrocodone and Statins  Home Medications   Current Outpatient Rx  Name Route Sig Dispense Refill  . ALBUTEROL SULFATE HFA 108 (90 BASE) MCG/ACT IN AERS Inhalation Inhale 2 puffs into the lungs every 6 (six) hours as needed.    . ASPIRIN EC 81 MG PO TBEC Oral Take 81 mg by mouth at bedtime.     . BUDESONIDE-FORMOTEROL FUMARATE 160-4.5 MCG/ACT IN AERO Inhalation Inhale 2 puffs into the lungs 2 (two) times daily.    [name] DILTIAZEM HCL ER 180 MG PO CP24 Oral Take 180 mg by mouth at bedtime.    [name] PRASUGREL HCL 10 MG PO TABS Oral Take 10 mg by mouth at bedtime.    [name] VALSARTAN-HYDROCHLOROTHIAZIDE 320-12.5 MG PO TABS Oral Take 1 tablet by mouth at bedtime.    . CEPHALEXIN 500 MG PO CAPS  1 po qid with food 28 capsule 0  . MOXIFLOXACIN HCL 400 MG PO TABS Oral Take 1 tablet (400 mg total) by mouth daily. 6 tablet 0    BP 145/81  Pulse 89  Temp 98.5 F (36.9 C) (Oral)  Resp 16  Ht 6\' 1"  (1.854 m)  Wt 240 lb (108.863 kg)  BMI 31.66 kg/m2  SpO2 98%  Physical Exam  Nursing note and vitals reviewed. Constitutional: He appears well-developed and well-nourished.  HENT:  Head: Normocephalic and atraumatic.  Eyes: Conjunctivae are normal.  Neck: Normal range of motion.  Cardiovascular: Normal rate.   Pulmonary/Chest: Effort normal and breath sounds normal.  Abdominal: There is no tenderness.  Musculoskeletal: Normal range of motion.  Neurological: He is alert.  Skin: Skin is dry.       1 cm raised, fluctuant abscess left frontal scalp,  Smaller nonfluctuant papule left parietal scalp.  Neither with surrounding erythema  or spontaneous drainage.  Psychiatric: He has a normal mood and affect.    ED Course  Procedures (including critical care time)  Labs Reviewed - No data to display No results found.   1. Abscess of scalp    INCISION AND DRAINAGE Performed by: Evalee Jefferson Consent: Verbal consent obtained. Risks and benefits: risks, benefits and alternatives were discussed Type: abscess  Body area: scalp  Anesthesia: local infiltration  Local anesthetic: lidocaine 2% with epinephrine  Anesthetic total: 3 ml  Complexity: complex Blunt dissection to break up loculations  Drainage: purulent  Drainage amount: scant  Packing material: 1/4 in iodoform gauze  Patient tolerance: Patient tolerated the procedure well with no immediate complications.      MDM  Return in 3 days for recheck,  Packing removal.  Prior wound culture result reviewed. Pt placed on avelox,  First dose given here.        Evalee Jefferson, [place] 02/25/12 2219

## 2012-02-25 NOTE — Discharge Instructions (Signed)
Please apply warm compress to both abscess ares daily until healed. Please finish your antibiotics. Return to the Emergency Dept if any changes or concerns.Abscess Care After An abscess (also called a boil or furuncle) is an infected area that contains a collection of pus. Signs and symptoms of an abscess include pain, tenderness, redness, or hardness, or you may feel a moveable soft area under your skin. An abscess can occur anywhere in the body. The infection may spread to surrounding tissues causing cellulitis. A cut (incision) by the surgeon was made over your abscess and the pus was drained out. Gauze may have been packed into the space to provide a drain that will allow the cavity to heal from the inside outwards. The boil may be painful for 5 to 7 days. Most people with a boil do not have high fevers. Your abscess, if seen early, may not have localized, and may not have been lanced. If not, another appointment may be required for this if it does not get better on its own or with medications. HOME CARE INSTRUCTIONS   Only take over-the-counter or prescription medicines for pain, discomfort, or fever as directed by your caregiver.   When you bathe, soak and then remove gauze or iodoform packs at least daily or as directed by your caregiver. You may then wash the wound gently with mild soapy water. Repack with gauze or do as your caregiver directs.  SEEK IMMEDIATE MEDICAL CARE IF:   You develop increased pain, swelling, redness, drainage, or bleeding in the wound site.   You develop signs of generalized infection including muscle aches, chills, fever, or a general ill feeling.   An oral temperature above 102 F (38.9 C) develops, not controlled by medication.  See your caregiver for a recheck if you develop any of the symptoms described above. If medications (antibiotics) were prescribed, take them as directed. Document Released: 03/01/2005 Document Revised: 08/02/2011 Document Reviewed:  10/27/2007 [facility] Patient Information 2012 [place].

## 2012-02-25 NOTE — ED Provider Notes (Signed)
History     CSN: [id number]  Arrival date & time 02/25/12  [id number]   First MD Initiated Contact with Patient 02/25/12 [phone]      Chief Complaint  Patient presents with  . Wound Check    (Consider location/radiation/quality/duration/timing/severity/associated sxs/prior treatment) Patient is a 56 y.o. male presenting with wound check. The history is provided by the patient.  Wound Check  He was treated in the ED 2 to 3 days ago. Previous treatment in the ED includes I&D of abscess. Treatments since wound repair include oral antibiotics. His temperature was unmeasured prior to arrival. There has been colored discharge from the wound. The redness has improved. The swelling has improved. The pain has improved.    Past Medical History  Diagnosis Date  . Arteriosclerotic cardiovascular disease (ASCVD)     Cath in 05/2011: Mild pulmonary hypertension, hyperdynamic LV, 50% LAD, severe first diagonal disease in a small vessel, 80% mid D2 also small, 40% circumflex, 50-60% RCA proximally and 99% distally-DES x2  . Hyperlipidemia   . Diabetes mellitus     Peripheral neuropathy  . Bell palsy   . Nephrolithiasis   . Abnormal cardiac cath   . Hypertension     Past Surgical History  Procedure Date  . Circumcision   . Stents     Family History  Problem Relation Age of Onset  . Diabetes Mother   . Heart attack Mother   . Stroke Mother   . Diabetes Sister   . Sleep apnea Sister   . Hypertension Brother   . Diabetes Brother     History  Substance Use Topics  . Smoking status: Never Smoker   . Smokeless tobacco: Not on file  . Alcohol Use: No      Review of Systems  Constitutional: Negative for activity change.       All ROS Neg except as noted in HPI  HENT: Negative for nosebleeds and neck pain.   Eyes: Negative for photophobia and discharge.  Respiratory: Negative for cough, shortness of breath and wheezing.   Cardiovascular: Positive for chest pain. Negative for  palpitations.  Gastrointestinal: Negative for abdominal pain and blood in stool.  Genitourinary: Negative for dysuria, frequency and hematuria.  Musculoskeletal: Negative for back pain and arthralgias.  Skin: Negative.        abscess  Neurological: Negative for dizziness, seizures and speech difficulty.  Psychiatric/Behavioral: Negative for hallucinations and confusion.    Allergies  Hydrocodone and Statins  Home Medications   Current Outpatient Rx  Name Route Sig Dispense Refill  . ALBUTEROL SULFATE HFA 108 (90 BASE) MCG/ACT IN AERS Inhalation Inhale 2 puffs into the lungs every 6 (six) hours as needed.    . ASPIRIN EC 81 MG PO TBEC Oral Take 81 mg by mouth at bedtime.     . BUDESONIDE-FORMOTEROL FUMARATE 160-4.5 MCG/ACT IN AERO Inhalation Inhale 2 puffs into the lungs 2 (two) times daily.    . CEPHALEXIN 500 MG PO CAPS  1 po qid with food 28 capsule 0  . DILTIAZEM HCL ER 180 MG PO CP24 Oral Take 180 mg by mouth at bedtime.    [name] MOXIFLOXACIN HCL 400 MG PO TABS Oral Take 1 tablet (400 mg total) by mouth daily. 6 tablet 0  . PRASUGREL HCL 10 MG PO TABS Oral Take 10 mg by mouth at bedtime.    [name] VALSARTAN-HYDROCHLOROTHIAZIDE 320-12.5 MG PO TABS Oral Take 1 tablet by mouth at bedtime.      BP 155/84  Pulse 80  Temp 97.9 F (36.6 C)  Resp 18  Ht 6\' 1"  (1.854 m)  Wt 230 lb (104.327 kg)  BMI 30.34 kg/m2  SpO2 100%  Physical Exam  Nursing note and vitals reviewed. Constitutional: He is oriented to person, place, and time. He appears well-developed and well-nourished.  Non-toxic appearance.  HENT:  Head: Normocephalic.  Right Ear: Tympanic membrane and external ear normal.  Left Ear: Tympanic membrane and external ear normal.       Packing in place of the abscess of the frontal scalp. Small abscess of the posterior scalp with mild to mod drainage.  Eyes: EOM and lids are normal. Pupils are equal, round, and reactive to light.  Neck: Normal range of motion. Neck supple. Carotid  bruit is not present.  Cardiovascular: Normal rate, regular rhythm, normal heart sounds, intact distal pulses and normal pulses.   Pulmonary/Chest: Breath sounds normal. No respiratory distress.  Abdominal: Soft. Bowel sounds are normal. There is no tenderness. There is no guarding.  Musculoskeletal: Normal range of motion.  Lymphadenopathy:       Head (right side): No submandibular adenopathy present.       Head (left side): No submandibular adenopathy present.    He has no cervical adenopathy.  Neurological: He is alert and oriented to person, place, and time. He has normal strength. No cranial nerve deficit or sensory deficit.  Skin: Skin is warm and dry.  Psychiatric: He has a normal mood and affect. His speech is normal.    ED Course  Procedures:    Labs Reviewed  WOUND CULTURE   No results found.   No diagnosis found.    MDM  I have reviewed nursing notes, vital signs, and all appropriate lab and imaging results for this patient. Pt has 2 abscess areas of the scalp. Packing removed from the frontal area scalp abscess. Abscess much improved.  The 2nd abscess of the posterior scalp is draining. I incised the area to facilitate better drainage, applied sterile dressing, and gave rocephin. Pt has oral antibiotics at home. Safe for pt to be discharged home.       Lenox Ahr, [place] 02/25/12 1046

## 2012-02-26 ENCOUNTER — Telehealth: Payer: Self-pay | Admitting: *Deleted

## 2012-02-26 ENCOUNTER — Other Ambulatory Visit (HOSPITAL_COMMUNITY): Payer: Self-pay | Admitting: Otolaryngology

## 2012-02-26 DIAGNOSIS — R1310 Dysphagia, unspecified: Secondary | ICD-10-CM

## 2012-02-26 LAB — LIPID PANEL
HDL: 39 mg/dL — ABNORMAL LOW (ref >39)
Triglycerides: 1243 mg/dL — ABNORMAL HIGH (ref <150)

## 2012-02-26 NOTE — Telephone Encounter (Signed)
Spoke to patient, who has not been taking a cholesterol medication.  States he has not been adherent with his DM either.  Copy of lipids will be sent to PCP and advised patient to make an appointment with them asap to address his diabetes.

## 2012-02-26 NOTE — ED Provider Notes (Signed)
Medical screening examination/treatment/procedure(s) were performed by non-physician practitioner and as supervising physician I was immediately available for consultation/collaboration.  Veryl Speak, MD 02/26/12 [phone]

## 2012-02-28 LAB — WOUND CULTURE: Gram Stain: NONE SEEN

## 2012-02-28 NOTE — ED Provider Notes (Signed)
Medical screening examination/treatment/procedure(s) were performed by non-physician practitioner and as supervising physician I was immediately available for consultation/collaboration.   Sharyon Cable, MD 02/28/12 [phone]

## 2012-02-29 ENCOUNTER — Ambulatory Visit (HOSPITAL_COMMUNITY)
Admission: RE | Admit: 2012-02-29 | Discharge: 2012-02-29 | Disposition: A | Discharge status: Home / Self Care | Payer: PRIVATE HEALTH INSURANCE | Source: Ambulatory Visit | Attending: Otolaryngology | Admitting: Otolaryngology

## 2012-02-29 DIAGNOSIS — E119 Type 2 diabetes mellitus without complications: Secondary | ICD-10-CM | POA: Insufficient documentation

## 2012-02-29 DIAGNOSIS — R1310 Dysphagia, unspecified: Secondary | ICD-10-CM

## 2012-02-29 DIAGNOSIS — I10 Essential (primary) hypertension: Secondary | ICD-10-CM | POA: Insufficient documentation

## 2012-02-29 NOTE — ED Notes (Signed)
+  Wound. Patient treated with Avelox. Sensitive to same. Per protocol MD.

## 2012-03-26 ENCOUNTER — Telehealth: Payer: Self-pay | Admitting: Cardiology

## 2012-03-26 MED ORDER — PRASUGREL HCL 10 MG PO TABS: 10.0000 mg | ORAL_TABLET | Freq: Every day | ORAL | Status: DC

## 2012-03-26 NOTE — Telephone Encounter (Signed)
Patient wants to know if he is to come off of Effient or have it refilled.  He has been on it since last October. / tg

## 2012-03-26 NOTE — Telephone Encounter (Signed)
Advised patient to stay on Effient and I will send refill in.  1 month supply of samples provided to patient.

## 2012-04-03 ENCOUNTER — Other Ambulatory Visit (HOSPITAL_COMMUNITY): Payer: Self-pay | Admitting: Pulmonary Disease

## 2012-04-03 ENCOUNTER — Ambulatory Visit (HOSPITAL_COMMUNITY)
Admission: RE | Admit: 2012-04-03 | Discharge: 2012-04-03 | Disposition: A | Discharge status: Home / Self Care | Payer: PRIVATE HEALTH INSURANCE | Source: Ambulatory Visit | Attending: Pulmonary Disease | Admitting: Pulmonary Disease

## 2012-04-03 DIAGNOSIS — R1310 Dysphagia, unspecified: Secondary | ICD-10-CM

## 2012-04-03 DIAGNOSIS — IMO0001 Reserved for inherently not codable concepts without codable children: Secondary | ICD-10-CM | POA: Insufficient documentation

## 2012-04-03 NOTE — Procedures (Signed)
Objective Swallowing Evaluation: Modified Barium Swallowing Study  Patient Details  Name: Eugene [name] MRN: [id number] Date of Birth: 1955/09/06  Today's Date: 04/03/2012 Time:  - 10:10    Past Medical History:  Past Medical History  Diagnosis Date  . Arteriosclerotic cardiovascular disease (ASCVD)     Cath in 05/2011: Mild pulmonary hypertension, hyperdynamic LV, 50% LAD, severe first diagonal disease in a small vessel, 80% mid D2 also small, 40% circumflex, 50-60% RCA proximally and 99% distally-DES x2  . Hyperlipidemia   . Diabetes mellitus     Peripheral neuropathy  . Bell palsy   . Nephrolithiasis   . Abnormal cardiac cath   . Hypertension    Past Surgical History:  Past Surgical History  Procedure Date  . Circumcision   . Stents    HPI:  [name] is a 56 yo male who was referred by Dr. Luan Pulling for a swallowing evaluation. Initially, [name] came to my office for a clinical swallow, however after pt interview and review of previous esophagram it was decided that MBSS was indicated. [name] reports that he had very few health concerns until October 2012 when he had 2 cardiac stents placed. He reports that after this procedure, he coughed while eating and became short of breath. Esophagram in July 2013 showed no witnessed aspiration, but suspected episode of aspiration due to strong coughing when taking barium tablet.   Symptoms/Limitations Symptoms: "I cough and get short of breath when I eat." Special Tests: MBSS  Recommendation/Prognosis  Clinical Impression Dysphagia Diagnosis: Within Functional Limits;Suspected primary esophageal dysphagia Clinical impression: Oropharyngeal swallow WFL with consistenies and textures presented today. The pt was given large quantities of trials and was taxed to attempt to recreate his symptoms. Although the pt had very mild thin liquid residuals in the lateral channels, he still complained of stasis and need to  cough once repeat swallow cleared pharynx. His coughing and shortness of breath seemed to increase throughout the study which makes me wonder if there is an esophageal and/or medication induced component to his symptoms. Pt. is unsure if he is taking any ACE inhibitors which can cause cough. Pt was noted to have delayed empyting in distal esophagus during esophageal sweep today and reports history of reflux (?stimulation of esophageal mucosal receptors in distal esophagus triggering cough?). [name] may benefit from empirical antireflux therapy and GI consult for pH probe testing etc. Pt reports increased shortness of breath since his cardiac procedure which is possibly enhanced during meals because of coordination or respiration with swallow. I spoke to the pt at length about trying frequent, smaller meals, and chewing food throroughly, taking small sips etc to see if this helps as well. Swallow Evaluation Recommendations Recommended Consults: Consider GI evaluation Diet Recommendations: Regular;Thin liquid Liquid Administration via: Cup;Straw Medication Administration: Whole meds with liquid Supervision: Patient able to self feed Compensations: Slow rate;Small sips/bites;Multiple dry swallows after each bite/sip Postural Changes and/or Swallow Maneuvers: Seated upright 90 degrees;Upright 30-60 min after meal Oral Care Recommendations: Oral care BID;Patient independent with oral care Follow up Recommendations: None   Individuals Consulted Report Sent to : Referring physician  SLP Assessment/Plan Dysphagia Diagnosis: Within Functional Limits;Suspected primary esophageal dysphagia Clinical impression: Oropharyngeal swallow WFL with consistenies and textures presented today. The pt was given large quantities of trials and was taxed to attempt to recreate his symptoms. Although the pt had very mild thin liquid residuals in the lateral channels, he still complained of stasis and need to cough  once  repeat swallow cleared pharynx. His coughing and shortness of breath seemed to increase throughout the study which makes me wonder if there is an esophageal and/or medication induced component to his symptoms. Pt. is unsure if he is taking any ACE inhibitors which can cause cough. Pt was noted to have delayed empyting in distal esophagus during esophageal sweep today and reports history of reflux (?stimulation of esophageal mucosal receptors in distal esophagus triggering cough?). [name] may benefit from empirical antireflux therapy and GI consult for pH probe testing etc. Pt reports increased shortness of breath since his cardiac procedure which is possibly enhanced during meals because of coordination or respiration with swallow. I spoke to the pt at length about trying frequent, smaller meals, and chewing food throroughly, taking small sips etc to see if this helps as well.  General:  Date of Onset: 06/04/11 HPI: [name] is a 56 yo male who was referred by Dr. Luan Pulling for a swallowing evaluation. Initially, [name] came to my office for a clinical swallow, however after pt interview and review of previous esophagram it was decided that MBSS was indicated. [name] reports that he had very few health concerns until October 2012 when he had 2 cardiac stents placed. He reports that after this procedure, he coughed while eating and became short of breath. Esophagram in July 2013 showed no witnessed aspiration, but suspected episode of aspiration due to strong coughing when taking barium tablet.  Type of Study: Modified Barium Swallowing Study Reason for Referral: Objectively evaluate swallowing function Previous Swallow Assessment: Esophagram February 29, 2012 Diet Prior to this Study: Regular;Thin liquids Temperature Spikes Noted: No Respiratory Status: Room air History of Recent Intubation: No Behavior/Cognition: Alert;Cooperative Oral Cavity - Dentition: Adequate natural  dentition Oral Motor / Sensory Function: Within functional limits Self-Feeding Abilities: Able to feed self Patient Positioning: Upright in chair Baseline Vocal Quality: Clear Volitional Cough: Strong Volitional Swallow: Able to elicit Anatomy: Within functional limits Pharyngeal Secretions: Not observed secondary MBS  Reason for Referral:  Objectively evaluate swallowing function   Oral Phase Oral Preparation/Oral Phase Oral Phase: WFL  Pharyngeal Phase  Pharyngeal Phase Pharyngeal Phase: Impaired Pharyngeal - Thin Pharyngeal - Thin Cup: Premature spillage to pyriform sinuses;Lateral channel residue Pharyngeal - Thin Straw: Premature spillage to pyriform sinuses;Lateral channel residue Pharyngeal Phase - Comment Pharyngeal Comment: Coughing elicited midway through study, however nothing objectively seen in pharynx/larynx to correlate. Pt had very mild residuals in the lateral channels.  Cervical Esophageal Phase  Cervical Esophageal Phase Cervical Esophageal Phase: [facility]  Thank you,  Genene Churn, [place] 04/03/2012, 6:38 PM

## 2012-04-11 ENCOUNTER — Encounter (HOSPITAL_COMMUNITY): Payer: Self-pay | Admitting: Emergency Medicine

## 2012-04-11 ENCOUNTER — Emergency Department (HOSPITAL_COMMUNITY)
Admission: EM | Admit: 2012-04-11 | Discharge: 2012-04-11 | Disposition: A | Discharge status: Home / Self Care | Payer: PRIVATE HEALTH INSURANCE | Attending: Emergency Medicine | Admitting: Emergency Medicine

## 2012-04-11 DIAGNOSIS — E119 Type 2 diabetes mellitus without complications: Secondary | ICD-10-CM | POA: Insufficient documentation

## 2012-04-11 DIAGNOSIS — E785 Hyperlipidemia, unspecified: Secondary | ICD-10-CM | POA: Insufficient documentation

## 2012-04-11 DIAGNOSIS — I10 Essential (primary) hypertension: Secondary | ICD-10-CM | POA: Insufficient documentation

## 2012-04-11 DIAGNOSIS — L02811 Cutaneous abscess of head [any part, except face]: Secondary | ICD-10-CM

## 2012-04-11 DIAGNOSIS — L02818 Cutaneous abscess of other sites: Secondary | ICD-10-CM | POA: Insufficient documentation

## 2012-04-11 DIAGNOSIS — J4489 Other specified chronic obstructive pulmonary disease: Secondary | ICD-10-CM | POA: Insufficient documentation

## 2012-04-11 DIAGNOSIS — L03818 Cellulitis of other sites: Secondary | ICD-10-CM | POA: Insufficient documentation

## 2012-04-11 DIAGNOSIS — J449 Chronic obstructive pulmonary disease, unspecified: Secondary | ICD-10-CM | POA: Insufficient documentation

## 2012-04-11 HISTORY — DX: Chronic obstructive pulmonary disease, unspecified: J44.9

## 2012-04-11 MED ORDER — OXYCODONE-ACETAMINOPHEN 5-325 MG PO TABS: ORAL_TABLET | ORAL | Status: DC

## 2012-04-11 MED ORDER — LIDOCAINE-EPINEPHRINE (PF) 1 %-1:200000 IJ SOLN
INTRAMUSCULAR | Status: AC
  Filled 2012-04-11: qty 10

## 2012-04-11 NOTE — ED Provider Notes (Signed)
History     CSN: [id number]  Arrival date & time 04/11/12  1601   First MD Initiated Contact with Patient 04/11/12 1620      Chief Complaint  Patient presents with  . Abscess    (Consider location/radiation/quality/duration/timing/severity/associated sxs/prior treatment) HPI Comments: Started 3 week regimen of doxycycline 2 days ago.  Wants area I&D'd.  Patient is a 56 y.o. male presenting with abscess. The history is provided by the patient. No language interpreter was used.  Abscess  This is a new problem. Episode onset: ~ 1 week ago.  wife has been cutting his hair with a set of electric  shears "that have rust on them and were  digging into my skin".  had abscess to scalp that was recently drained here.  he also dr. [name] 2 days ago and started him on a 3 week regimen   The problem has been gradually worsening. The abscess is present on the scalp. The abscess first occurred at home. Pertinent negatives include no fever. There were no sick contacts. Recently, medical care has been given by the PCP. Services received include medications given.    Past Medical History  Diagnosis Date  . Arteriosclerotic cardiovascular disease (ASCVD)     Cath in 05/2011: Mild pulmonary hypertension, hyperdynamic LV, 50% LAD, severe first diagonal disease in a small vessel, 80% mid D2 also small, 40% circumflex, 50-60% RCA proximally and 99% distally-DES x2  . Hyperlipidemia   . Diabetes mellitus     Peripheral neuropathy  . Bell palsy   . Nephrolithiasis   . Abnormal cardiac cath   . Hypertension   . COPD (chronic obstructive pulmonary disease)     Past Surgical History  Procedure Date  . Circumcision   . Stents     Family History  Problem Relation Age of Onset  . Diabetes Mother   . Heart attack Mother   . Stroke Mother   . Diabetes Sister   . Sleep apnea Sister   . Hypertension Brother   . Diabetes Brother     History  Substance Use Topics  . Smoking status: Never Smoker     . Smokeless tobacco: Not on file  . Alcohol Use: No      Review of Systems  Constitutional: Negative for fever and chills.  Skin:       Abscess   All other systems reviewed and are negative.    Allergies  Hydrocodone and Statins  Home Medications   Current Outpatient Rx  Name Route Sig Dispense Refill  . ALBUTEROL SULFATE HFA 108 (90 BASE) MCG/ACT IN AERS Inhalation Inhale 2 puffs into the lungs every 6 (six) hours as needed.    . ASPIRIN EC 81 MG PO TBEC Oral Take 81 mg by mouth at bedtime.     . BUDESONIDE-FORMOTEROL FUMARATE 160-4.5 MCG/ACT IN AERO Inhalation Inhale 2 puffs into the lungs 2 (two) times daily.    [name] DILTIAZEM HCL ER 180 MG PO CP24 Oral Take 180 mg by mouth at bedtime.    [name] DOXYCYCLINE HYCLATE 100 MG PO CAPS Oral Take 100 mg by mouth 2 (two) times daily.    [name] PRASUGREL HCL 10 MG PO TABS Oral Take 1 tablet (10 mg total) by mouth at bedtime. 30 tablet 12    DO NOT fill until patient requests  . VALSARTAN-HYDROCHLOROTHIAZIDE 320-12.5 MG PO TABS Oral Take 1 tablet by mouth at bedtime.    . OXYCODONE-ACETAMINOPHEN 5-325 MG PO TABS  One tab po q  4-6 hrs prn pain 20 tablet 0    BP 148/79  Pulse 98  Temp 98.3 F (36.8 C) (Oral)  Resp 20  Ht 6\' 1"  (1.854 m)  Wt 232 lb (105.235 kg)  BMI 30.61 kg/m2  SpO2 100%  Physical Exam  Nursing note and vitals reviewed. Constitutional: He is oriented to person, place, and time. He appears well-developed and well-nourished.  HENT:  Head: Normocephalic and atraumatic.    Eyes: EOM are normal.  Neck: Normal range of motion.  Cardiovascular: Normal rate, regular rhythm, normal heart sounds and intact distal pulses.   Pulmonary/Chest: Effort normal and breath sounds normal. No respiratory distress.  Abdominal: Soft. He exhibits no distension. There is no tenderness.  Musculoskeletal: Normal range of motion.  Neurological: He is alert and oriented to person, place, and time.  Skin: Skin is warm and dry.  Psychiatric:  He has a normal mood and affect. Judgment normal.    ED Course  INCISION AND DRAINAGE Date/Time: 04/11/2012 4:40 PM Performed by: Jennye Boroughs Authorized by: Jennye Boroughs Consent: Verbal consent obtained. Written consent not obtained. Risks and benefits: risks, benefits and alternatives were discussed Consent given by: patient Patient understanding: patient states understanding of the procedure being performed Patient consent: the patient's understanding of the procedure matches consent given Site marked: the operative site was not marked Imaging studies: imaging studies not available Patient identity confirmed: verbally with patient Time out: Immediately prior to procedure a "time out" was called to verify the correct patient, procedure, equipment, support staff and site/side marked as required. Type: abscess Body area: head/neck Location details: scalp Anesthesia: local infiltration Local anesthetic: lidocaine 1% with epinephrine Anesthetic total: 2 ml Patient sedated: no Scalpel size: 11 Needle gauge: 25 ga. Incision type: single straight Drainage characteristics: no purulent drainage noted. Wound treatment: drain placed Packing material: 1/4 in gauze (~ 2 inches) Patient tolerance: Patient tolerated the procedure well with no immediate complications.   (including critical care time)  Labs Reviewed - No data to display No results found.   1. Scalp abscess       MDM  Has doxycycline rx-percocet, 20 Warm compresses. F/u with dr. [name] on Monday.        Jennye Boroughs, [place] 04/11/12 [phone]

## 2012-04-11 NOTE — ED Notes (Signed)
Pt c/o abscess to the back of the head x 2 weeks.

## 2012-04-12 NOTE — ED Provider Notes (Signed)
Medical screening examination/treatment/procedure(s) were performed by non-physician practitioner and as supervising physician I was immediately available for consultation/collaboration.   Mylinda Latina III, MD 04/12/12 [phone]

## 2012-04-24 ENCOUNTER — Ambulatory Visit (INDEPENDENT_AMBULATORY_CARE_PROVIDER_SITE_OTHER): Payer: PRIVATE HEALTH INSURANCE | Admitting: Gastroenterology

## 2012-04-24 ENCOUNTER — Encounter: Payer: Self-pay | Admitting: Gastroenterology

## 2012-04-24 ENCOUNTER — Telehealth: Payer: Self-pay

## 2012-04-24 VITALS — BP 144/87 | HR 96 | Temp 97.8°F | Ht 73.0 in | Wt 234.6 lb

## 2012-04-24 DIAGNOSIS — R05 Cough: Secondary | ICD-10-CM

## 2012-04-24 DIAGNOSIS — R053 Chronic cough: Secondary | ICD-10-CM | POA: Insufficient documentation

## 2012-04-24 DIAGNOSIS — R0602 Shortness of breath: Secondary | ICD-10-CM

## 2012-04-24 DIAGNOSIS — R1310 Dysphagia, unspecified: Secondary | ICD-10-CM

## 2012-04-24 MED ORDER — PANTOPRAZOLE SODIUM 40 MG PO TBEC: 40.0000 mg | DELAYED_RELEASE_TABLET | Freq: Every day | ORAL | Status: DC

## 2012-04-24 NOTE — Telephone Encounter (Signed)
Eugene [name], this pt was seen in our office today for dysphagia/cough. We are in the process of scheduling an EDG/ED with Dr. Gala Romney. Please discuss with Dr. Lattie Haw if pt should hold Effient prior to procedure and if so for how long. Thanks for your help!

## 2012-04-24 NOTE — Assessment & Plan Note (Signed)
One year h/o worsening SOB, chronic cough, strangles/chokes on saliva/food/pills. Denies typical GERD symptoms. SOB with talking and minimal exertion. MBSS questions esophageal issue with noted delayed esophageal emptying distally. Notable he also had markedly abnormal GES during hospitalization in 04/2011.   Offered patient EGD/ED. He is on Effient, will discuss holding with cardiology. Start on PPI (pantoprazole) to see if any change in symptoms. To discuss with Dr. Gala Romney, regarding if pH probe testing would be beneficial vs pH/EM.

## 2012-04-24 NOTE — Patient Instructions (Addendum)
We have scheduled you for an upper endoscopy with Dr. Gala Romney. I will discuss with Dr. Izell Ravens[place] office regarding holding your Effient for the procedure.  Please start pantoprazole one daily before breakfast. Prescription sent to your pharmacy.

## 2012-04-24 NOTE — Progress Notes (Signed)
Primary Care Physician:  [name], MD Referring physician: Dr. Velvet Bathe. Primary Gastroenterologist:  Garfield Cornea, MD   Chief Complaint  Patient presents with  . Dysphagia    HPI:  Eugene [name] is a 56 y.o. male here for further evaluation of chronic cough/SOB, dysphagia. He has one year history of these symptoms. Symptoms did not improved after cardiac cath in 05/2011. On oxygen at bedtime, recently started CPAP for sleep apnea. Takes symbicort bid and albuterol prn. SOB with talking. SOB with ambulating 20 feet. Gets lightheaded with SOB. Denies chest pain. States he does not have clear understanding as to why he has had so much SOB in last one year. Never smoked. 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   At one point, switched from lisinopril to see if was cause of chronic cough. Cough improved. Still with coughing and strangling on saliva, food, pills, liquids. Does not feel like food sticking in chest. No heartburn. GERD 20 years ago. EGD/ED by Dr. Earley Brooke and given medication for it. After six months, no further heartburn. No abdominal pain. No constipation, diarrhea, melena, brbpr. Colonoscopy two years ago, [place], [place]. Per patient it was normal and he has to have next one in 10 years.   CT A/P with contrast 04/2011: IMPRESSION:  1. No acute inflammatory process within abdomen or pelvis.  2. At least one calcified gallstone noted within gallbladder neck  region measures 4.5 mm.  3. No hydronephrosis or hydroureter.  4. No pericecal inflammation. Normal appendix is clearly  visualized.  5. Nonspecific mild thickening of urinary bladder wall.  GES 04/2011: IMPRESSION:  Abnormal exam demonstrating no evidence of gastric emptying at 2  hours following ingestion of the standardized meal.  This can be seen with diabetic gastroparesis and with gastric  outlet obstruction.  BPE  02/2012: IMPRESSION:  Vallecular residuals.  Significant episode of coughing when swallowing water question  unwitnessed aspiration; laryngeal penetration and aspiration were  not visualized at the time of targeted rapid sequence imaging.  12.5 mm diameter barium tablet passed from oral cavity to stomach without obstruction. Mild diffuse age-related impairment of esophageal motility with incomplete clearance of barium by primary peristaltic waves. Clearance of contrast was achieved by repeat dry swallows, noted  secondary waves, and upright positioning. Tertiary waves also noted.  MBSS: Oropharyngeal swallow WFL with consistenies and textures presented today. The pt was given large quantities of trials and was taxed to attempt to recreate his symptoms. Although the pt had very mild thin liquid residuals in the lateral channels, he still complained of stasis and need to cough once repeat swallow cleared pharynx. His coughing and shortness of breath seemed to increase throughout the study which makes me wonder if there is an esophageal and/or medication induced component to his symptoms. Pt. is unsure if he is taking any ACE inhibitors which can cause cough. Pt was noted to have delayed empyting in distal esophagus during esophageal sweep today and reports history of reflux (?stimulation of esophageal mucosal receptors in distal esophagus triggering cough?). [name] may benefit from empirical antireflux therapy and GI consult for pH probe testing etc. Pt reports increased shortness of breath since his cardiac procedure which is possibly enhanced during meals because of coordination or respiration with swallow. I spoke to the pt at length about trying frequent, smaller meals, and chewing food throroughly, taking small sips etc to see if this helps as well.  Current Outpatient Prescriptions  Medication Sig Dispense Refill  . albuterol (PROAIR HFA) 108 (90 BASE) MCG/ACT inhaler Inhale 2 puffs into  the lungs every 6 (six) hours as needed.      [name] aspirin EC 81 MG tablet Take 81 mg by mouth at bedtime.       . budesonide-formoterol (SYMBICORT) 160-4.5 MCG/ACT inhaler Inhale 2 puffs into the lungs 2 (two) times daily.      [name] diltiazem (DILACOR XR) 180 MG 24 hr capsule Take 180 mg by mouth at bedtime.      [name] doxycycline (VIBRAMYCIN) 100 MG capsule Take 100 mg by mouth 2 (two) times daily.      . fenofibrate 160 MG tablet Take 160 mg by mouth daily.      . Flaxseed, Linseed, (FLAXSEED OIL) 1000 MG CAPS Take 1,000 mg by mouth daily.      . prasugrel (EFFIENT) 10 MG TABS Take 1 tablet (10 mg total) by mouth at bedtime.  30 tablet  12  . valsartan-hydrochlorothiazide (DIOVAN-HCT) 320-12.5 MG per tablet Take 1 tablet by mouth at bedtime.        Allergies as of 04/24/2012 - Review Complete 04/24/2012  Allergen Reaction Noted  . Hydrocodone Nausea And Vomiting 01/31/2012  . Statins Other (See Comments) 12/25/2011    Past Medical History  Diagnosis Date  . Arteriosclerotic cardiovascular disease (ASCVD)     Cath in 05/2011: Mild pulmonary hypertension, hyperdynamic LV, 50% LAD, severe first diagonal disease in a small vessel, 80% mid D2 also small, 40% circumflex, 50-60% RCA proximally and 99% distally-DES x2  . Hyperlipidemia   . Diabetes mellitus     Peripheral neuropathy  . Bell palsy   . Nephrolithiasis   . Abnormal cardiac cath   . Hypertension   . COPD (chronic obstructive pulmonary disease)     Past Surgical History  Procedure Date  . Circumcision   . Stents   . Esophagogastroduodenoscopy     in [place] over 20 yrs ago  . Colonoscopy     In [facility], approximately 2011 per patient, was normal. Advised to come back in 10 years.    Family History  Problem Relation Age of Onset  . Diabetes Mother   . Heart attack Mother   . Stroke Mother   . Diabetes Sister   . Sleep apnea Sister   . Hypertension Brother   . Diabetes Brother   . Colon cancer Neg Hx     . Liver disease Neg Hx     History   Social History  . Marital Status: Married    Spouse Name: N/A    Number of Children: 2  . Years of Education: N/A   Occupational History  . Shipping     [employer]   Social History Main Topics  . Smoking status: Never Smoker   . Smokeless tobacco: Not on file  . Alcohol Use: No     heavy etoh use 30 years ago  . Drug Use: No  . Sexually Active: Yes    Birth Control/ Protection: None   Other Topics Concern  . 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 Disabled at this point.      ROS:  General: Negative for anorexia, unintentional weight loss, fever, chills, fatigue, weakness. Reports intentional 30 plus pound weight loss in 2 years.  Eyes: Negative for vision changes.  ENT: Negative for hoarseness, nasal congestion. CV:  Negative for chest pain, angina, palpitations, peripheral edema. C/o DOE. SOB with talking.  Respiratory: Negative for sputum, wheezing. See hpi.  GI: See history of present illness. GU:  Negative for dysuria, hematuria, urinary incontinence, urinary frequency, nocturnal urination.  MS: Negative for joint pain, low back pain.  Derm: Negative for rash or itching.  Neuro: Negative for weakness, abnormal sensation, seizure, frequent headaches, memory loss, confusion.  Psych: Negative for anxiety, depression, suicidal ideation, hallucinations.  Endo: Negative for unusual weight change.  Heme: Negative for bruising or bleeding. Allergy: Negative for rash or hives.    Physical Examination:  BP 144/87  Pulse 96  Temp 97.8 F (36.6 C) (Temporal)  Ht 6\' 1"  (1.854 m)  Wt 234 lb 9.6 oz (106.414 kg)  BMI 30.95 kg/m2   General: Well-nourished, well-developed in no acute distress. Visibly gets SOB with prolonged talking.  Head: Normocephalic, atraumatic.   Eyes: Conjunctiva pink, no icterus. Mouth: Oropharyngeal mucosa moist and pink , no lesions erythema or  exudate. Neck: Supple without thyromegaly, masses, or lymphadenopathy.  Lungs: Clear to auscultation bilaterally.  Heart: Regular rate and rhythm, no murmurs rubs or gallops.  Abdomen: Bowel sounds are normal, nontender, nondistended, no hepatosplenomegaly or masses, no abdominal bruits or    hernia , no rebound or guarding.   Rectal: not performed. Extremities: No lower extremity edema. No clubbing or deformities.  Neuro: Alert and oriented x 4 , grossly normal neurologically.  Skin: Warm and dry, no rash or jaundice.   Psych: Alert and cooperative, normal mood and affect.  Labs: Lab Results  Component Value Date   WBC 5.5 10/28/2011   HGB 13.9 10/28/2011   HCT 38.5* 10/28/2011   MCV 86.1 10/28/2011   PLT 170 10/28/2011   Lab Results  Component Value Date   CREATININE 0.91 11/19/2011   BUN 26* 11/19/2011   NA 129* 11/19/2011   K 4.7 11/19/2011   CL 92* 11/19/2011   CO2 24 11/19/2011   Lab Results  Component Value Date   ALT 19 11/19/2011   AST 18 11/19/2011   ALKPHOS 108 11/19/2011   BILITOT 0.5 11/19/2011     Imaging Studies:  See above.

## 2012-04-25 NOTE — Telephone Encounter (Signed)
Please advise 

## 2012-04-27 NOTE — Telephone Encounter (Signed)
Prasugrel is to be continued until 05/2012.  EGD can proceed without discontinuing this medication. If biopsy is required based upon EGD results, this will need to be done in a separate procedure after drug has been held for one week.

## 2012-04-29 NOTE — Telephone Encounter (Signed)
Routing to Neil Crouch, [place] and to [employer].

## 2012-04-29 NOTE — Progress Notes (Signed)
Faxed to PCP

## 2012-04-29 NOTE — Telephone Encounter (Signed)
Please see recommendations from Dr Lattie Haw

## 2012-04-30 ENCOUNTER — Encounter: Payer: Self-pay | Admitting: Cardiology

## 2012-04-30 NOTE — Telephone Encounter (Signed)
Patient actually needs esophageal dilation at time of EGD, which carries higher risk of bleeding.   So, I would recommend waiting to perform procedure until 05/2012 or after so that Effient can be held for one week prior to procedure, as per Dr. Izell Luxora recommendations.   Patient will need to come back in for OV in 3 weeks with Dr. Gala Romney to schedule for EGD/dilation.   Copy to Dr. Lattie Haw.

## 2012-04-30 NOTE — Telephone Encounter (Signed)
Called and informed pt. Routing to [place] to schedule OV with Dr. Gala Romney.

## 2012-04-30 NOTE — Telephone Encounter (Signed)
Pt is aware of OV on 9/24 @ 4 with RMR

## 2012-05-08 ENCOUNTER — Telehealth: Payer: Self-pay | Admitting: *Deleted

## 2012-05-08 NOTE — Telephone Encounter (Signed)
Coronary stent was placed on 06/01/2011 at which time dual antiplatelet therapy was initiated. One year of therapy is advised after such procedures. One year of therapy will end on 05/30/2012. Antiplatelet effect will dissipate over the subsequent week. After 06/06/2012 patient's coagulation status will return to normal except for the antiplatelet effect of aspirin. Prior to 06/06/2012 elective procedures with a risk of bleeding should not be performed. Jacqulyn Ducking, MD [facility], please advise patient to discontinue Prasugrel on 05/30/2012.  Attempted to contact patient.  No answer at this time.

## 2012-05-08 NOTE — Telephone Encounter (Signed)
Patient notified of recommendations and copy sent to Neil Crouch.  Pt provided enough Effient to last him until October 4th.

## 2012-05-09 ENCOUNTER — Encounter (HOSPITAL_COMMUNITY): Payer: Self-pay | Admitting: *Deleted

## 2012-05-09 ENCOUNTER — Emergency Department (HOSPITAL_COMMUNITY): Payer: PRIVATE HEALTH INSURANCE

## 2012-05-09 ENCOUNTER — Inpatient Hospital Stay (HOSPITAL_COMMUNITY)
Admission: EM | Admit: 2012-05-09 | Discharge: 2012-05-11 | DRG: 074 | Disposition: A | Discharge status: Home / Self Care | Payer: PRIVATE HEALTH INSURANCE | Attending: Internal Medicine | Admitting: Internal Medicine

## 2012-05-09 DIAGNOSIS — G629 Polyneuropathy, unspecified: Secondary | ICD-10-CM

## 2012-05-09 DIAGNOSIS — R05 Cough: Secondary | ICD-10-CM

## 2012-05-09 DIAGNOSIS — E669 Obesity, unspecified: Secondary | ICD-10-CM | POA: Diagnosis present

## 2012-05-09 DIAGNOSIS — Z91199 Patient's noncompliance with other medical treatment and regimen due to unspecified reason: Secondary | ICD-10-CM

## 2012-05-09 DIAGNOSIS — K3184 Gastroparesis: Secondary | ICD-10-CM | POA: Diagnosis present

## 2012-05-09 DIAGNOSIS — R079 Chest pain, unspecified: Secondary | ICD-10-CM | POA: Diagnosis present

## 2012-05-09 DIAGNOSIS — R1310 Dysphagia, unspecified: Secondary | ICD-10-CM

## 2012-05-09 DIAGNOSIS — Z23 Encounter for immunization: Secondary | ICD-10-CM

## 2012-05-09 DIAGNOSIS — G4733 Obstructive sleep apnea (adult) (pediatric): Secondary | ICD-10-CM | POA: Diagnosis present

## 2012-05-09 DIAGNOSIS — I7090 Unspecified atherosclerosis: Secondary | ICD-10-CM

## 2012-05-09 DIAGNOSIS — Z6832 Body mass index (BMI) 32.0-32.9, adult: Secondary | ICD-10-CM

## 2012-05-09 DIAGNOSIS — Z8249 Family history of ischemic heart disease and other diseases of the circulatory system: Secondary | ICD-10-CM

## 2012-05-09 DIAGNOSIS — E785 Hyperlipidemia, unspecified: Secondary | ICD-10-CM | POA: Diagnosis present

## 2012-05-09 DIAGNOSIS — R0602 Shortness of breath: Secondary | ICD-10-CM

## 2012-05-09 DIAGNOSIS — I2510 Atherosclerotic heart disease of native coronary artery without angina pectoris: Secondary | ICD-10-CM | POA: Diagnosis present

## 2012-05-09 DIAGNOSIS — Z833 Family history of diabetes mellitus: Secondary | ICD-10-CM

## 2012-05-09 DIAGNOSIS — K219 Gastro-esophageal reflux disease without esophagitis: Secondary | ICD-10-CM | POA: Diagnosis present

## 2012-05-09 DIAGNOSIS — N529 Male erectile dysfunction, unspecified: Secondary | ICD-10-CM

## 2012-05-09 DIAGNOSIS — R739 Hyperglycemia, unspecified: Secondary | ICD-10-CM

## 2012-05-09 DIAGNOSIS — J4489 Other specified chronic obstructive pulmonary disease: Secondary | ICD-10-CM | POA: Diagnosis present

## 2012-05-09 DIAGNOSIS — I10 Essential (primary) hypertension: Secondary | ICD-10-CM | POA: Diagnosis present

## 2012-05-09 DIAGNOSIS — E1140 Type 2 diabetes mellitus with diabetic neuropathy, unspecified: Secondary | ICD-10-CM

## 2012-05-09 DIAGNOSIS — E119 Type 2 diabetes mellitus without complications: Secondary | ICD-10-CM

## 2012-05-09 DIAGNOSIS — E871 Hypo-osmolality and hyponatremia: Secondary | ICD-10-CM | POA: Diagnosis present

## 2012-05-09 DIAGNOSIS — J449 Chronic obstructive pulmonary disease, unspecified: Secondary | ICD-10-CM | POA: Diagnosis present

## 2012-05-09 DIAGNOSIS — E1142 Type 2 diabetes mellitus with diabetic polyneuropathy: Secondary | ICD-10-CM | POA: Diagnosis present

## 2012-05-09 DIAGNOSIS — E1149 Type 2 diabetes mellitus with other diabetic neurological complication: Secondary | ICD-10-CM

## 2012-05-09 DIAGNOSIS — Z9119 Patient's noncompliance with other medical treatment and regimen: Secondary | ICD-10-CM

## 2012-05-09 HISTORY — DX: Gastro-esophageal reflux disease without esophagitis: K21.9

## 2012-05-09 LAB — TROPONIN I
Troponin I: <0.3 ng/mL (ref <0.30)
Troponin I: <0.3 ng/mL (ref <0.30)

## 2012-05-09 LAB — COMPREHENSIVE METABOLIC PANEL
Albumin: 3.2 g/dL — ABNORMAL LOW (ref 3.5–5.2)
Alkaline Phosphatase: 118 U/L — ABNORMAL HIGH (ref 39–117)
BUN: 29 mg/dL — ABNORMAL HIGH (ref 6–23)
Calcium: 9.5 mg/dL (ref 8.4–10.5)
Creatinine, Ser: 0.96 mg/dL (ref 0.50–1.35)
GFR calc Af Amer: >90 mL/min (ref >90)
Glucose, Bld: 674 mg/dL (ref 70–99)
Total Protein: 6.7 g/dL (ref 6.0–8.3)

## 2012-05-09 LAB — GLUCOSE, CAPILLARY
Glucose-Capillary: 380 mg/dL — ABNORMAL HIGH (ref 70–99)
Glucose-Capillary: 349 mg/dL — ABNORMAL HIGH (ref 70–99)
Glucose-Capillary: 231 mg/dL — ABNORMAL HIGH (ref 70–99)
Glucose-Capillary: 166 mg/dL — ABNORMAL HIGH (ref 70–99)
Glucose-Capillary: 176 mg/dL — ABNORMAL HIGH (ref 70–99)
Glucose-Capillary: 189 mg/dL — ABNORMAL HIGH (ref 70–99)

## 2012-05-09 LAB — CBC
HCT: 37.2 % — ABNORMAL LOW (ref 39.0–52.0)
Hemoglobin: 13.4 g/dL (ref 13.0–17.0)
MCH: 31.3 pg (ref 26.0–34.0)
MCHC: 36 g/dL (ref 30.0–36.0)
RDW: 13.2 % (ref 11.5–15.5)

## 2012-05-09 LAB — PROTIME-INR
INR: 0.88 (ref 0.00–1.49)
Prothrombin Time: 12.1 seconds (ref 11.6–15.2)

## 2012-05-09 MED ORDER — ACETAMINOPHEN 325 MG PO TABS
650.0000 mg | ORAL_TABLET | Freq: Once | ORAL | Status: AC
  Administered 2012-05-09: 650 mg via ORAL
  Filled 2012-05-09: qty 2

## 2012-05-09 MED ORDER — BUDESONIDE-FORMOTEROL FUMARATE 160-4.5 MCG/ACT IN AERO
INHALATION_SPRAY | RESPIRATORY_TRACT | Status: AC
  Filled 2012-05-09: qty 6

## 2012-05-09 MED ORDER — PRASUGREL HCL 10 MG PO TABS
10.0000 mg | ORAL_TABLET | Freq: Every day | ORAL | Status: DC
  Filled 2012-05-09 (×3): qty 1

## 2012-05-09 MED ORDER — INFLUENZA VIRUS VACC SPLIT PF IM SUSP
0.2500 mL | INTRAMUSCULAR | Status: AC
  Administered 2012-05-10: 0.25 mL via INTRAMUSCULAR
  Filled 2012-05-09: qty 0.25

## 2012-05-09 MED ORDER — ALUM & MAG HYDROXIDE-SIMETH 200-200-20 MG/5ML PO SUSP: 30.0000 mL | Freq: Four times a day (QID) | ORAL | Status: DC | PRN

## 2012-05-09 MED ORDER — INSULIN ASPART 100 UNIT/ML ~~LOC~~ SOLN
14.0000 [IU] | Freq: Once | SUBCUTANEOUS | Status: AC
  Administered 2012-05-09: 14 [IU] via SUBCUTANEOUS
  Filled 2012-05-09: qty 1

## 2012-05-09 MED ORDER — ENOXAPARIN SODIUM 40 MG/0.4ML ~~LOC~~ SOLN
40.0000 mg | SUBCUTANEOUS | Status: DC
  Administered 2012-05-09 – 2012-05-10 (×2): 40 mg via SUBCUTANEOUS
  Filled 2012-05-09 (×2): qty 0.4

## 2012-05-09 MED ORDER — SODIUM CHLORIDE 0.9 % IV SOLN
INTRAVENOUS | Status: DC
  Administered 2012-05-09: 3.6 [IU]/h via INTRAVENOUS
  Filled 2012-05-09: qty 1

## 2012-05-09 MED ORDER — SODIUM CHLORIDE 0.9 % IV SOLN
INTRAVENOUS | Status: DC
  Administered 2012-05-09: 17:00:00 via INTRAVENOUS

## 2012-05-09 MED ORDER — GI COCKTAIL ~~LOC~~
30.0000 mL | Freq: Once | ORAL | Status: AC
  Administered 2012-05-09: 30 mL via ORAL
  Filled 2012-05-09: qty 30

## 2012-05-09 MED ORDER — BUDESONIDE-FORMOTEROL FUMARATE 160-4.5 MCG/ACT IN AERO
2.0000 | INHALATION_SPRAY | Freq: Two times a day (BID) | RESPIRATORY_TRACT | Status: DC
  Administered 2012-05-09 – 2012-05-11 (×4): 2 via RESPIRATORY_TRACT
  Filled 2012-05-09: qty 6

## 2012-05-09 MED ORDER — SODIUM CHLORIDE 0.9 % IV BOLUS (SEPSIS)
1000.0000 mL | Freq: Once | INTRAVENOUS | Status: AC
  Administered 2012-05-09: 1000 mL via INTRAVENOUS

## 2012-05-09 MED ORDER — INSULIN REGULAR BOLUS VIA INFUSION: 0.0000 [IU] | Freq: Three times a day (TID) | INTRAVENOUS | Status: DC

## 2012-05-09 MED ORDER — DEXTROSE-NACL 5-0.45 % IV SOLN: INTRAVENOUS | Status: DC

## 2012-05-09 MED ORDER — ALPRAZOLAM 0.5 MG PO TABS
0.5000 mg | ORAL_TABLET | Freq: Once | ORAL | Status: AC
  Administered 2012-05-09: 0.5 mg via ORAL
  Filled 2012-05-09: qty 1

## 2012-05-09 MED ORDER — ASPIRIN EC 81 MG PO TBEC
81.0000 mg | DELAYED_RELEASE_TABLET | Freq: Every day | ORAL | Status: DC
  Administered 2012-05-09 – 2012-05-10 (×2): 81 mg via ORAL
  Filled 2012-05-09 (×3): qty 1

## 2012-05-09 MED ORDER — SODIUM CHLORIDE 0.9 % IV SOLN
INTRAVENOUS | Status: DC
  Administered 2012-05-09 (×2): via INTRAVENOUS

## 2012-05-09 MED ORDER — SODIUM CHLORIDE 0.9 % IJ SOLN
3.0000 mL | Freq: Two times a day (BID) | INTRAMUSCULAR | Status: DC
  Administered 2012-05-10: 3 mL via INTRAVENOUS
  Filled 2012-05-09: qty 3

## 2012-05-09 MED ORDER — PANTOPRAZOLE SODIUM 40 MG IV SOLR
40.0000 mg | Freq: Once | INTRAVENOUS | Status: AC
  Administered 2012-05-09: 40 mg via INTRAVENOUS
  Filled 2012-05-09: qty 40

## 2012-05-09 MED ORDER — ASPIRIN 81 MG PO CHEW
324.0000 mg | CHEWABLE_TABLET | Freq: Once | ORAL | Status: AC
Start: 2012-05-09 — End: 2012-05-09
  Administered 2012-05-09: 324 mg via ORAL
  Filled 2012-05-09: qty 4

## 2012-05-09 MED ORDER — PANTOPRAZOLE SODIUM 40 MG PO TBEC
40.0000 mg | DELAYED_RELEASE_TABLET | Freq: Two times a day (BID) | ORAL | Status: DC
  Administered 2012-05-10 – 2012-05-11 (×3): 40 mg via ORAL
  Filled 2012-05-09 (×3): qty 1

## 2012-05-09 MED ORDER — METOCLOPRAMIDE HCL 5 MG/ML IJ SOLN: 10.0000 mg | Freq: Four times a day (QID) | INTRAMUSCULAR | Status: DC | PRN

## 2012-05-09 MED ORDER — IRBESARTAN 300 MG PO TABS
300.0000 mg | ORAL_TABLET | Freq: Every day | ORAL | Status: DC
  Administered 2012-05-09 – 2012-05-10 (×2): 300 mg via ORAL
  Filled 2012-05-09 (×2): qty 1

## 2012-05-09 MED ORDER — ALBUTEROL SULFATE HFA 108 (90 BASE) MCG/ACT IN AERS: 2.0000 | INHALATION_SPRAY | Freq: Four times a day (QID) | RESPIRATORY_TRACT | Status: DC | PRN

## 2012-05-09 MED ORDER — DILTIAZEM HCL ER COATED BEADS 180 MG PO CP24
180.0000 mg | ORAL_CAPSULE | Freq: Every day | ORAL | Status: DC
  Administered 2012-05-09 – 2012-05-10 (×2): 180 mg via ORAL
  Filled 2012-05-09 (×5): qty 1

## 2012-05-09 MED ORDER — DEXTROSE 50 % IV SOLN: 25.0000 mL | INTRAVENOUS | Status: DC | PRN

## 2012-05-09 NOTE — ED Notes (Signed)
CRITICAL VALUE ALERT  Critical value received:  Glucose 674  Date of notification:  05/09/2012  Time of notification:  1507  Critical value read back:yes  Nurse who received alert:  Marita [place] Jatasia Gundrum,rn  MD notified (1st page):  steinl  Time of first page:  1507  MD notified (2nd page):  Time of second page:  Responding MD:  steinl  Time MD responded:  [phone]

## 2012-05-09 NOTE — ED Provider Notes (Addendum)
History     CSN: [id number]  Arrival date & time 05/09/12  1356   First MD Initiated Contact with Patient 05/09/12 1408      Chief Complaint  Patient presents with  . Chest Pain    (Consider location/radiation/quality/duration/timing/severity/associated sxs/prior treatment) Patient is a 56 y.o. male presenting with chest pain. The history is provided by the patient.  Chest Pain Primary symptoms include shortness of breath. Pertinent negatives for primary symptoms include no fever, no cough, no abdominal pain, no nausea and no vomiting.   pt w hx cad, presents w mid chest pain/tightness for the past few days. At rest. States initially episodes lasting 20-30 minutes, now present since this morning. Constant. Does not radiate. No specific exacerbating or alleviating factors. +mild sob. No nv or diaphoresis. Pain is not pleuritic. No leg pain or swelling. ?hx gerd, pt states has plans for egd next month. Pt unsure if current symptoms same as symptoms prior to cath/stent 10/12. Denies cough, fever or uri c/o.       Past Medical History  Diagnosis Date  . Arteriosclerotic cardiovascular disease (ASCVD)     Cath in 05/2011: Mild pulmonary hypertension, hyperdynamic LV, 50% LAD, severe first diagonal disease in a small vessel, 80% mid D2 also small, 40% circumflex, 50-60% RCA proximally and 99% distally-DES x2  . Hyperlipidemia   . Diabetes mellitus     Peripheral neuropathy  . Bell palsy   . Abnormal cardiac cath   . Hypertension   . COPD (chronic obstructive pulmonary disease)   . Sleep apnea   . Gallstones   . Nephrolithiasis     Past Surgical History  Procedure Date  . Circumcision   . Stents   . Esophagogastroduodenoscopy     in [place] over 20 yrs ago  . Colonoscopy     In [facility], approximately 2011 per patient, was normal. Advised to come back in 10 years.    Family History  Problem Relation Age of Onset  . Diabetes Mother   . Heart attack  Mother   . Stroke Mother   . Diabetes Sister   . Sleep apnea Sister   . Hypertension Brother   . Diabetes Brother   . Colon cancer Neg Hx   . Liver disease Neg Hx     History  Substance Use Topics  . Smoking status: Never Smoker   . Smokeless tobacco: Not on file  . Alcohol Use: No     heavy etoh use 30 years ago      Review of Systems  Constitutional: Negative for fever.  HENT: Negative for neck pain.   Eyes: Negative for redness.  Respiratory: Positive for shortness of breath. Negative for cough.   Cardiovascular: Positive for chest pain.  Gastrointestinal: Negative for nausea, vomiting and abdominal pain.  Genitourinary: Negative for flank pain.  Musculoskeletal: Negative for back pain.  Skin: Negative for rash.  Neurological: Negative for headaches.  Hematological: Does not bruise/bleed easily.  Psychiatric/Behavioral: Negative for confusion.    Allergies  Hydrocodone and Statins  Home Medications   Current Outpatient Rx  Name Route Sig Dispense Refill  . ALBUTEROL SULFATE HFA 108 (90 BASE) MCG/ACT IN AERS Inhalation Inhale 2 puffs into the lungs every 6 (six) hours as needed.    . ASPIRIN EC 81 MG PO TBEC Oral Take 81 mg by mouth at bedtime.     . BUDESONIDE-FORMOTEROL FUMARATE 160-4.5 MCG/ACT IN AERO Inhalation Inhale 2 puffs into the lungs 2 (two) times  daily.    [name] DILTIAZEM HCL ER 180 MG PO CP24 Oral Take 180 mg by mouth at bedtime.    [name] DOXYCYCLINE HYCLATE 100 MG PO CAPS Oral Take 100 mg by mouth 2 (two) times daily.    . FENOFIBRATE 160 MG PO TABS Oral Take 160 mg by mouth daily.    [name] FLAXSEED OIL 1000 MG PO CAPS Oral Take 1,000 mg by mouth daily.    [name] PANTOPRAZOLE SODIUM 40 MG PO TBEC Oral Take 1 tablet (40 mg total) by mouth daily. 30 tablet 3  . VALSARTAN-HYDROCHLOROTHIAZIDE 320-12.5 MG PO TABS Oral Take 1 tablet by mouth at bedtime.      BP 174/90  Temp 98.5 F (36.9 C) (Oral)  Resp 16  Ht 6\' 1"  (1.854 m)  Wt 233 lb (105.688 kg)  BMI 30.74  kg/m2  SpO2 97%  Physical Exam  Nursing note and vitals reviewed. Constitutional: He is oriented to person, place, and time. He appears well-developed and well-nourished. No distress.  HENT:  Head: Atraumatic.  Eyes: Conjunctivae normal are normal. No scleral icterus.  Neck: Neck supple. No JVD present. No tracheal deviation present.  Cardiovascular: Regular rhythm, normal heart sounds and intact distal pulses.  Exam reveals no gallop and no friction rub.   No murmur heard. Pulmonary/Chest: Effort normal and breath sounds normal. No accessory muscle usage. No respiratory distress. He exhibits no tenderness.  Abdominal: Soft. Bowel sounds are normal. He exhibits no distension and no mass. There is no tenderness. There is no rebound and no guarding.  Musculoskeletal: Normal range of motion. He exhibits no edema and no tenderness.  Neurological: He is alert and oriented to person, place, and time.  Skin: Skin is warm and dry.  Psychiatric: He has a normal mood and affect.    ED Course  Procedures (including critical care time)   Labs Reviewed  CBC  COMPREHENSIVE METABOLIC PANEL  TROPONIN I  PROTIME-INR   Results for orders placed during the hospital encounter of 05/09/12  CBC      Component Value Range   WBC 4.9  4.0 - 10.5 K/uL   RBC 4.28  4.22 - 5.81 MIL/uL   Hemoglobin 13.4  13.0 - 17.0 g/dL   HCT 37.2 (*) 39.0 - 52.0 %   MCV 86.9  78.0 - 100.0 fL   MCH 31.3  26.0 - 34.0 pg   MCHC 36.0  30.0 - 36.0 g/dL   RDW 13.2  11.5 - 15.5 %   Platelets 187  150 - 400 K/uL  COMPREHENSIVE METABOLIC PANEL      Component Value Range   Sodium 124 (*) 135 - 145 mEq/L   Potassium 4.5  3.5 - 5.1 mEq/L   Chloride 89 (*) 96 - 112 mEq/L   CO2 23  19 - 32 mEq/L   Glucose, Bld 674 (*) 70 - 99 mg/dL   BUN 29 (*) 6 - 23 mg/dL   Creatinine, Ser 0.96  0.50 - 1.35 mg/dL   Calcium 9.5  8.4 - 10.5 mg/dL   Total Protein 6.7  6.0 - 8.3 g/dL   Albumin 3.2 (*) 3.5 - 5.2 g/dL   AST 15  0 - 37 U/L    ALT 17  0 - 53 U/L   Alkaline Phosphatase 118 (*) 39 - 117 U/L   Total Bilirubin 0.4  0.3 - 1.2 mg/dL   GFR calc non Af Amer >90  >90 mL/min   GFR calc Af Amer >90  >90  mL/min  TROPONIN I      Component Value Range   Troponin I <0.30  <0.30 ng/mL  PROTIME-INR      Component Value Range   Prothrombin Time 12.1  11.6 - 15.2 seconds   INR 0.88  0.00 - 1.49   Dg Chest Port 1 View  05/09/2012  *RADIOLOGY REPORT*  Clinical Data: Chest pain.  History of coronary artery disease with stenting.  PORTABLE CHEST - 1 VIEW 05/09/2012 1419 hours:  Comparison: Portable chest x-ray 10/08/2011, 09/12/2004.  Two-view chest x-ray 08/22/2011.  Findings: Suboptimal inspiration which accounts for atelectasis at the bases and accentuates the cardiac silhouette.  Taking this into account, cardiac silhouette upper normal in size to borderline enlarged but stable.  Hilar and mediastinal contours otherwise unremarkable.  Lungs otherwise clear.  IMPRESSION: Suboptimal inspiration accounts for mild bibasilar atelectasis.  No acute cardiopulmonary disease otherwise.   Original Report Authenticated By: Deniece Portela, M.D.        MDM  Iv ns. Labs. Monitor. Ecg. Pt states has not taken his asa yet today. Asa po.  Discussed w leb card on call.     Date: 05/09/2012  Rate: 108  Rhythm: sinus tachycardia  QRS Axis: normal  Intervals: normal  ST/T Wave abnormalities: normal  Conduction Disutrbances:incomplete rbbb  Narrative Interpretation:   Old EKG Reviewed: unchanged   After cp for several hrs, initial troponin neg and ecg without acute change. Glucose 674, pt notes hx niddm, has not been taking any meds for same or following diabetic diet.  hco3 normal.  Iv ns bolus. novolog sq.    Given pts hx, labs, will admit for ivf, tx severe hyperglycemia, and chest pain rule out.    [facility]iscussed [facility], Dr Conley Canal, including labs, need for cp r/o, bs 674, meds so far in ed - she requests he also be started  on insulin gtt, admit.      Mirna Mires, MD 05/09/12 [place], MD 05/09/12 [phone]

## 2012-05-09 NOTE — ED Notes (Signed)
Chest pain intermittent for 3 days, this am "didnt go away"No n/v, sob,

## 2012-05-09 NOTE — H&P (Addendum)
Hospital Admission Note Date: 05/09/2012  Patient name: Eugene [name] Medical record number: [id number] Date of birth: 1956/08/05 Age: 56 y.o. Gender: male PCP: [name], MD  Attending physician: Mirna Mires, MD  Chief Complaint: chest pain  History of Present Illness:  Eugene [name] is an 56 y.o. male with a history of heart disease and stent who presents with substernal chest pain. As twisting. He's had it periodically for the past several days. He had it quite severe this morning when he woke up. He reports that he had no accompanying symptoms. It resolved after he was given a GI cocktail. His stent was placed in October of last year. After he is able to come off effient next month, he is scheduled for upper endoscopy for dysphagia.  In the emergency room, he has had a negative troponin and an EKG which shows no change from previous and no acute changes. However, his blood glucoses noted to be above 600. Gap is normal. He has been given a liter of fluid. And subcutaneous insulin. Patient previously was on 80 units of Lantus nightly but stopped taking it about 6 months ago. When asked why, he reports that "it stressed him out". He does not check his blood glucoses. He has been compliant with his cardiac medications. He has had polyuria polydipsia and blurry vision.   Past Medical History  Diagnosis Date  . Arteriosclerotic cardiovascular disease (ASCVD)     Cath in 05/2011: Mild pulmonary hypertension, hyperdynamic LV, 50% LAD, severe first diagonal disease in a small vessel, 80% mid D2 also small, 40% circumflex, 50-60% RCA proximally and 99% distally-DES x2  . Hyperlipidemia   . Diabetes mellitus     Peripheral neuropathy  . Bell palsy   . Abnormal cardiac cath   . Hypertension   . COPD (chronic obstructive pulmonary disease)   . Sleep apnea   . Gallstones   . Nephrolithiasis    gastroparesis  Meds:  See medicine reconciliation.  Allergies: Hydrocodone and  Statins History   Social History  . Marital Status: Married    Spouse Name: N/A    Number of Children: 2  . Years of Education: N/A   Occupational History  . Shipping     [employer]   Social History Main Topics  . Smoking status: Never Smoker   . Smokeless tobacco: Not on file  . Alcohol Use: No     heavy etoh use 30 years ago  . Drug Use: No  . Sexually Active: Yes    Birth Control/ Protection: None   Other Topics Concern  . 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 Disabled at this point.   Family History  Problem Relation Age of Onset  . Diabetes Mother   . Heart attack Mother   . Stroke Mother   . Diabetes Sister   . Sleep apnea Sister   . Hypertension Brother   . Diabetes Brother   . Colon cancer Neg Hx   . Liver disease Neg Hx    Past Surgical History  Procedure Date  . Circumcision   . Stents   . Esophagogastroduodenoscopy     in [place] over 20 yrs ago  . Colonoscopy     In [facility], approximately 2011 per patient, was normal. Advised to come back in 10 years.    Review of Systems: Systems reviewed and as per HPI, otherwise negative.  Physical Exam:  Blood pressure 148/83, pulse 95, temperature 98.5 F (36.9 C), temperature source Oral, resp. rate 20, height 6\' 1"  (1.854 m), weight 105.688 kg (233 lb), SpO2 95.00%. BP 164/93  Pulse 99  Temp 98.5 F (36.9 C) (Oral)  Resp 20  Ht 6\' 1"  (1.854 m)  Wt 105.688 kg (233 lb)  BMI 30.74 kg/m2  SpO2 95%  General Appearance:    Alert, cooperative, no distress, appears stated age. obese   Head:    Normocephalic, without obvious abnormality, atraumatic  Eyes:    PERRL, conjunctiva/corneas clear, EOM's intact, fundi    benign, both eyes       Ears:    Normal TM's and external ear canals, both ears  Nose:   Nares normal, septum midline, mucosa normal, no drainage    or sinus tenderness  Throat:   Lips, mucosa, and tongue normal;  teeth and gums normal  Neck:   Supple, symmetrical, trachea midline, no adenopathy;       thyroid:  No enlargement/tenderness/nodules; no carotid   bruit or JVD  Back:     Symmetric, no curvature, ROM normal, no CVA tenderness  Lungs:     Clear to auscultation bilaterally, respirations unlabored  Chest wall:    No tenderness or deformity  Heart:    Regular rate and rhythm, S1 and S2 normal, no murmur, rub   or gallop  Abdomen:     Soft, non-tender, bowel sounds active all four quadrants,    no masses, no organomegaly.  Genitalia:   deferred   Rectal:   deferred   Extremities:   Extremities normal, atraumatic, no cyanosis or edema  Pulses:   2+ and symmetric all extremities  Skin:   Skin color, texture, turgor normal, no rashes or lesions  Lymph nodes:   Cervical, supraclavicular, and axillary nodes normal  Neurologic:   CNII-XII intact. Normal strength, sensation and reflexes      throughout    Psychiatric: Normal affect.  Lab results: Basic Metabolic Panel:  Basename 05/09/12 1415  NA 124*  K 4.5  CL 89*  CO2 23  GLUCOSE 674*  BUN 29*  CREATININE 0.96  CALCIUM 9.5  MG --  PHOS --   Liver Function Tests:  Basename 05/09/12 1415  AST 15  ALT 17  ALKPHOS 118*  BILITOT 0.4  PROT 6.7  ALBUMIN 3.2*   CBC:  Basename 05/09/12 1415  WBC 4.9  NEUTROABS --  HGB 13.4  HCT 37.2*  MCV 86.9  PLT 187   Cardiac Enzymes:  Basename 05/09/12 1415  CKTOTAL --  CKMB --  CKMBINDEX --  TROPONINI <0.30   CBG:  Basename 05/09/12 1712 05/09/12 1625  GLUCAP 415* 455*   Coagulation:  Basename 05/09/12 1415  LABPROT 12.1  INR 0.88   Imaging results:  Dg Chest Port 1 View  05/09/2012  *RADIOLOGY REPORT*  Clinical Data: Chest pain.  History of coronary artery disease with stenting.  PORTABLE CHEST - 1 VIEW 05/09/2012 1419 hours:  Comparison: Portable chest x-ray 10/08/2011, 09/12/2004.  Two-view chest x-ray 08/22/2011.  Findings: Suboptimal inspiration which accounts for  atelectasis at the bases and accentuates the cardiac silhouette.  Taking this into account, cardiac silhouette upper normal in size to borderline enlarged but stable.  Hilar and mediastinal contours otherwise unremarkable.  Lungs otherwise clear.  IMPRESSION: Suboptimal inspiration accounts for mild bibasilar atelectasis.  No acute cardiopulmonary disease otherwise.   Original Report Authenticated By: Deniece Portela, M.D.    EKG shows NSR with  incomplete RBBB  Assessment & Plan: Active Problems:  DM type 2, uncontrolled, with neuropathy  Chest pain  Hypertension  Obstructive sleep apnea  Noncompliance  Patient will be started on an insulin gtt.  Anion gap is 12.  Was previously on lantus 80 units. IVF.  Increase PPI to bid.  R/o MI, but likely gi etiology. CPAP qhs. Resume ASA, effient, ARB, diltiazem.  [place] L 05/09/2012, 5:22 PM

## 2012-05-09 NOTE — Telephone Encounter (Signed)
Appreciate cardiology input regarding issue.  Patient has OV with Dr. Gala Romney on 05/20/12.  Patient will be off Effient starting 05/30/12. We will need to schedule his EGD/ED 06/06/12 or after.

## 2012-05-10 ENCOUNTER — Encounter (HOSPITAL_COMMUNITY): Payer: Self-pay | Admitting: Internal Medicine

## 2012-05-10 DIAGNOSIS — K219 Gastro-esophageal reflux disease without esophagitis: Secondary | ICD-10-CM

## 2012-05-10 LAB — GLUCOSE, CAPILLARY
Glucose-Capillary: 263 mg/dL — ABNORMAL HIGH (ref 70–99)
Glucose-Capillary: 273 mg/dL — ABNORMAL HIGH (ref 70–99)

## 2012-05-10 LAB — BASIC METABOLIC PANEL
BUN: 27 mg/dL — ABNORMAL HIGH (ref 6–23)
Chloride: 96 mEq/L (ref 96–112)
GFR calc Af Amer: >90 mL/min (ref >90)
GFR calc non Af Amer: >90 mL/min (ref >90)
Potassium: 3.7 mEq/L (ref 3.5–5.1)
Sodium: 130 mEq/L — ABNORMAL LOW (ref 135–145)

## 2012-05-10 LAB — TROPONIN I: Troponin I: <0.3 ng/mL (ref <0.30)

## 2012-05-10 MED ORDER — INSULIN GLARGINE 100 UNIT/ML ~~LOC~~ SOLN
10.0000 [IU] | Freq: Once | SUBCUTANEOUS | Status: AC
  Administered 2012-05-10: 10 [IU] via SUBCUTANEOUS

## 2012-05-10 MED ORDER — PRASUGREL HCL 10 MG PO TABS
10.0000 mg | ORAL_TABLET | Freq: Every day | ORAL | Status: DC
  Filled 2012-05-10: qty 1

## 2012-05-10 MED ORDER — INSULIN ASPART 100 UNIT/ML ~~LOC~~ SOLN
0.0000 [IU] | Freq: Three times a day (TID) | SUBCUTANEOUS | Status: DC
  Administered 2012-05-10: 5 [IU] via SUBCUTANEOUS

## 2012-05-10 MED ORDER — POTASSIUM CHLORIDE IN NACL 20-0.9 MEQ/L-% IV SOLN
INTRAVENOUS | Status: DC
  Administered 2012-05-10: 1000 mL via INTRAVENOUS
  Administered 2012-05-10: 10:00:00 via INTRAVENOUS

## 2012-05-10 MED ORDER — INSULIN GLARGINE 100 UNIT/ML ~~LOC~~ SOLN
35.0000 [IU] | Freq: Every day | SUBCUTANEOUS | Status: DC
  Administered 2012-05-10: 35 [IU] via SUBCUTANEOUS

## 2012-05-10 MED ORDER — INSULIN ASPART 100 UNIT/ML ~~LOC~~ SOLN
0.0000 [IU] | Freq: Three times a day (TID) | SUBCUTANEOUS | Status: DC
  Administered 2012-05-10 (×2): 11 [IU] via SUBCUTANEOUS
  Administered 2012-05-11: 7 [IU] via SUBCUTANEOUS

## 2012-05-10 MED ORDER — INSULIN ASPART 100 UNIT/ML ~~LOC~~ SOLN
0.0000 [IU] | Freq: Every day | SUBCUTANEOUS | Status: DC
  Administered 2012-05-10: 3 [IU] via SUBCUTANEOUS

## 2012-05-10 MED ORDER — INSULIN ASPART 100 UNIT/ML ~~LOC~~ SOLN
0.0000 [IU] | Freq: Every day | SUBCUTANEOUS | Status: DC
Start: 2012-05-10 — End: 2012-05-10

## 2012-05-10 NOTE — Progress Notes (Signed)
Pt given CPAP to wear , setting 10, nasal mask , no oxygen bled in. He wears at bedtime. Takes on and off at will.

## 2012-05-10 NOTE — Progress Notes (Signed)
Subjective: The patient has no complaints of chest pain or shortness of breath. Overall, he feels better.  Objective: Vital signs in last 24 hours: Filed Vitals:   05/10/12 0500 05/10/12 0600 05/10/12 0700 05/10/12 0800  BP: 119/78 150/88 149/88   Pulse: 61     Temp:      TempSrc:      Resp: 13 13 0 18  Height:      Weight: 105.8 kg (233 lb 4 oz)     SpO2: 98%       Intake/Output Summary (Last 24 hours) at 05/10/12 0834 Last data filed at 05/10/12 0600  Gross per 24 hour  Intake 1884.83 ml  Output      0 ml  Net 1884.83 ml    Weight change:   Physical exam: General: Pleasant alert 56 year old Caucasian man laying in bed, in no acute distress. Lungs: Clear to auscultation bilaterally. Heart: S1, S2, with no murmurs rubs or gallops. Abdomen: Obese, positive bowel sounds, soft, nontender, nondistended. Extremities: No pedal edema. Pedal pulses palpable. Neuro: Alert and oriented x3. Cranial nerves II through XII are intact.  Lab Results: Basic Metabolic Panel:  Basename 05/10/12 0506 05/09/12 1415  NA 130* 124*  K 3.7 4.5  CL 96 89*  CO2 24 23  GLUCOSE 248* 674*  BUN 27* 29*  CREATININE 0.80 0.96  CALCIUM 9.0 9.5  MG -- --  PHOS -- --   Liver Function Tests:  Basename 05/09/12 1415  AST 15  ALT 17  ALKPHOS 118*  BILITOT 0.4  PROT 6.7  ALBUMIN 3.2*   No results found for this basename: LIPASE:2,AMYLASE:2 in the last 72 hours No results found for this basename: AMMONIA:2 in the last 72 hours CBC:  Basename 05/09/12 1415  WBC 4.9  NEUTROABS --  HGB 13.4  HCT 37.2*  MCV 86.9  PLT 187   Cardiac Enzymes:  Basename 05/09/12 2346 05/09/12 1809 05/09/12 1415  CKTOTAL -- -- --  CKMB -- -- --  CKMBINDEX -- -- --  TROPONINI <0.30 <0.30 <0.30   BNP: No results found for this basename: PROBNP:3 in the last 72 hours D-Dimer: No results found for this basename: DDIMER:2 in the last 72 hours CBG:  Basename 05/10/12 0731 05/09/12 2351 05/09/12 2319  05/09/12 2248 05/09/12 2149 05/09/12 2045  GLUCAP 263* 174* 189* 171* 176* 166*   Hemoglobin A1C:  Basename 05/09/12 1415  HGBA1C 12.8*   Fasting Lipid Panel: No results found for this basename: CHOL,HDL,LDLCALC,TRIG,CHOLHDL,LDLDIRECT in the last 72 hours Thyroid Function Tests: No results found for this basename: TSH,T4TOTAL,FREET4,T3FREE,THYROIDAB in the last 72 hours Anemia Panel: No results found for this basename: VITAMINB12,FOLATE,FERRITIN,TIBC,IRON,RETICCTPCT in the last 72 hours Coagulation:  Basename 05/09/12 1415  LABPROT 12.1  INR 0.88   Urine Drug Screen: Drugs of Abuse  No results found for this basename: labopia,  cocainscrnur,  labbenz,  amphetmu,  thcu,  labbarb    Alcohol Level: No results found for this basename: ETH:2 in the last 72 hours Urinalysis: No results found for this basename: COLORURINE:2,APPERANCEUR:2,LABSPEC:2,PHURINE:2,GLUCOSEU:2,HGBUR:2,BILIRUBINUR:2,KETONESUR:2,PROTEINUR:2,UROBILINOGEN:2,NITRITE:2,LEUKOCYTESUR:2 in the last 72 hours Misc. Labs:   Micro: Recent Results (from the past 240 hour(s))  MRSA PCR SCREENING     Status: Normal   Collection Time   05/09/12  5:55 PM      Component Value Range Status Comment   MRSA by PCR NEGATIVE  NEGATIVE Final     Studies/Results: Dg Chest Port 1 View  05/09/2012  *RADIOLOGY REPORT*  Clinical Data: Chest pain.  History of coronary artery disease with stenting.  PORTABLE CHEST - 1 VIEW 05/09/2012 1419 hours:  Comparison: Portable chest x-ray 10/08/2011, 09/12/2004.  Two-view chest x-ray 08/22/2011.  Findings: Suboptimal inspiration which accounts for atelectasis at the bases and accentuates the cardiac silhouette.  Taking this into account, cardiac silhouette upper normal in size to borderline enlarged but stable.  Hilar and mediastinal contours otherwise unremarkable.  Lungs otherwise clear.  IMPRESSION: Suboptimal inspiration accounts for mild bibasilar atelectasis.  No acute cardiopulmonary disease  otherwise.   Original Report Authenticated By: Deniece Portela, M.D.     Medications:  Scheduled:   . acetaminophen  650 mg Oral Once  . ALPRAZolam  0.5 mg Oral Once  . aspirin  324 mg Oral Once  . aspirin EC  81 mg Oral QHS  . budesonide-formoterol  2 puff Inhalation BID  . diltiazem  180 mg Oral QHS  . enoxaparin (LOVENOX) injection  40 mg Subcutaneous Q24H  . gi cocktail  30 mL Oral Once  . influenza  inactive virus vaccine  0.25 mL Intramuscular Tomorrow-1000  . insulin aspart  0-5 Units Subcutaneous QHS  . insulin aspart  0-9 Units Subcutaneous TID WC  . insulin aspart  14 Units Subcutaneous Once  . insulin glargine  10 Units Subcutaneous Once  . irbesartan  300 mg Oral QHS  . pantoprazole  40 mg Oral BID AC  . pantoprazole (PROTONIX) IV  40 mg Intravenous Once  . prasugrel  10 mg Oral Daily  . sodium chloride  1,000 mL Intravenous Once  . sodium chloride  3 mL Intravenous Q12H  . DISCONTD: insulin regular  0-10 Units Intravenous TID WC   Continuous:   . sodium chloride 100 mL/hr at 05/09/12 1727  . sodium chloride 20 mL/hr at 05/09/12 1800  . DISCONTD: dextrose 5 % and 0.45% NaCl    . DISCONTD: insulin (NOVOLIN-R) infusion 8.7 mL/hr at 05/09/12 1851   [id number], alum & mag hydroxide-simeth, dextrose, metoCLOPramide (REGLAN) injection  Assessment: Principal Problem:  *Chest pain Active Problems:  DM type 2, uncontrolled, with neuropathy  Hypertension  Arteriosclerotic cardiovascular disease (ASCVD)  Obstructive sleep apnea  Hyponatremia  Noncompliance  OSA (obstructive sleep apnea)  GERD (gastroesophageal reflux disease)    1. Chest pain in the setting of known arteriosclerotic cardiovascular disease. Myocardial infarction ruled out. Suspect chest pain was from a combination of uncontrolled diabetes and gastroesophageal reflux disease. We'll continue twice a day dosing of Protonix and cardiac medications.  Uncontrolled type 2 diabetes mellitus with  neuropathy. Status post insulin drip and one dose of Lantus. Sliding scale NovoLog has been started. We'll continue both and adjust accordingly. Hemoglobin A1c is noted to be 12.8, poor control. Patient admitted to noncompliance with insulin at home.  Hyponatremia, secondary to volume depletion and hyperglycemia. Serum sodium improving with IV fluids, but not yet within normal limits.  Hypertension. Currently stable on medications.  Obstructive sleep apnea. We'll continue CPAP.    Plan:  1. Restart standing dose of Lantus. 2. Increase sliding scale NovoLog to resistant  scale. 3. The patient was encouraged to become compliant with insulin therapy. 4. We'll monitor another day. Continue IV fluids. Followup on serum sodium in the morning.    LOS: 1 day   Ashleen Demma 05/10/2012, 8:34 AM

## 2012-05-10 NOTE — Progress Notes (Signed)
PT TRANSFER TO ROOM 211 ON TELEMETRY. ALERT AND ORIENTED. SKIN WARM AND DRY. CBG'S <300. IV  IN RT FOREARM IS PATENT. THIS NURSE WILL CONTINUE TO PROVIDE NURSING CARE FOR THE REST OF THIS SHIFT.

## 2012-05-11 LAB — BASIC METABOLIC PANEL
BUN: 18 mg/dL (ref 6–23)
Chloride: 99 mEq/L (ref 96–112)
Creatinine, Ser: 0.82 mg/dL (ref 0.50–1.35)
GFR calc Af Amer: >90 mL/min (ref >90)
Glucose, Bld: 243 mg/dL — ABNORMAL HIGH (ref 70–99)

## 2012-05-11 LAB — GLUCOSE, CAPILLARY: Glucose-Capillary: 225 mg/dL — ABNORMAL HIGH (ref 70–99)

## 2012-05-11 MED ORDER — INSULIN ASPART 100 UNIT/ML ~~LOC~~ SOLN: SUBCUTANEOUS | Status: DC

## 2012-05-11 MED ORDER — PRASUGREL HCL 10 MG PO TABS: 10.0000 mg | ORAL_TABLET | Freq: Every day | ORAL | Status: DC

## 2012-05-11 MED ORDER — INSULIN GLARGINE 100 UNIT/ML ~~LOC~~ SOLN: 65.0000 [IU] | Freq: Every day | SUBCUTANEOUS | Status: DC

## 2012-05-11 NOTE — Discharge Summary (Signed)
Physician Discharge Summary  Eugene [name] [id number] DOB: [birth year] DOA: 05/09/2012  PCP: [name], MD  Admit date: 05/09/2012 Discharge date: 05/11/2012  Recommendations for Outpatient Follow-up:  1. The patient was discharged to home in improved condition. He will followup with Dr. [name] as already scheduled in 2-3 weeks.  Discharge Diagnoses:  1. Chest pain. Myocardial infarction ruled out. Chest pain was thought to be secondary to a combination of uncontrolled diabetes and GERD. 2. Uncontrolled type 2 diabetes mellitus, secondary to noncompliance. The patient's hemoglobin A1c was 12.8. 3. History of arteriosclerotic cardiovascular disease. Stable. 4. Hyponatremia secondary to hyperglycemia and volume depletion. 5. GERD. 6. Obstructive sleep apnea, on CPAP. 7. Hypertension. Controlled. 8. Obesity. 9. Noncompliance, secondary to financial constraints and recent history of being uninsured.  Discharge Condition: Improved.  Diet recommendation: Carbohydrate modified and heart healthy.  Filed Weights   05/09/12 1759 05/10/12 0500 05/11/12 0525  Weight: 105.6 kg (232 lb 12.9 oz) 105.8 kg (233 lb 4 oz) 110.2 kg (242 lb 15.2 oz)    History of present illness:  The patient is a 56 year old man with a history significant for coronary artery disease, diabetes mellitus, and hypertension, who presented to the emergency department on 05/09/2012 with a chief complaint of chest pain. He also complained of an increase in urination, increase in thirst, and blurred vision. He had not taken insulin in several months due to being uninsured and the inability to pay for it out of pocket. However recently, he was approved for disability benefits. In the emergency department, he was afebrile and hemodynamically stable. His lab data were significant for a venous glucose of 674, sodium of 124, and normal troponin I. His chest x-ray revealed no acute cardiopulmonary process. His EKG revealed normal  sinus rhythm with an incomplete right bundle branch block. He was admitted for further evaluation and management.  Hospital Course:  The patient's anion gap was 12 and his CO2 was within normal limits. He was not felt to have DKA, however, he was started on an insulin drip. IV fluids with normal saline were given for hydration and to treat hyponatremia. He had been recently complaining of reflux symptoms and dysphagia-like symptoms. He was maintained on proton pump inhibitor therapy but the dose of Protonix was increased to twice a day. Upon questioning the patient, he had not been completely compliant with taking Protonix even once daily. Before this hospitalization, the plan was for the patient to see gastroenterology for an outpatient EGD once he was able to come off of Effient in October. He was given a GI cocktail in the emergency department which was apparently successful in resolving his chest pain. He was restarted on all of his chronic medications. As needed analgesics were ordered for pain. For further evaluation, a number of studies were ordered. All of his cardiac enzymes were within normal limits. His hemoglobin A1c was 12.8. His PT/INR was within normal limits. His serum improved to 131.   The patient remained afebrile and hemodynamically stable. He had no complaints of chest pain during the hospitalization. Once he is capillary blood glucose improved, the insulin drip was discontinued in favor of Lantus and sliding-scale NovoLog. Overall, his capillary blood glucose ranged from 230 to 250. He was discharged home on Lantus and the addition of sliding scale NovoLog. The patient was encouraged to become more compliant with insulin therapy and proton pump inhibitor therapy.  Procedures:  None  Consultations:  None  Discharge Exam: Filed Vitals:   05/10/12 1438 05/10/12  1954 05/10/12 2224 05/11/12 0525  BP: 134/86  143/84 140/80  Pulse: 79  82 74  Temp: 97.8 F (36.6 C)  98.5 F (36.9  C) 98 F (36.7 C)  TempSrc: Oral  Oral Oral  Resp: 18  18 18   Height:      Weight:    110.2 kg (242 lb 15.2 oz)  SpO2: 98% 97% 98% 98%    General: Pleasant 56 year old obese Caucasian male sitting up in bed, in no acute distress. Cardiovascular: S1, S2, with no murmurs rubs or gallops. Respiratory: Clear to auscultation bilaterally.  Discharge Instructions  Discharge Orders    Future Appointments: Provider: Department: Dept Phone: Center:   05/20/2012 4:00 PM Daneil Dolin, MD [facility] [phone] [facility]   06/23/2012 2:45 PM Yehuda Savannah, MD [facility] [phone]     Future Orders Please Complete By Expires   Diet - low sodium heart healthy      Diet Carb Modified      Increase activity slowly      Discharge instructions      Comments:   CHECK YOUR BLOOD SUGARS AT LEAST TWICE DAILY.       Medication List     As of 05/11/2012  9:25 AM    TAKE these medications         aspirin EC 81 MG tablet   Take 81 mg by mouth at bedtime.      budesonide-formoterol 160-4.5 MCG/ACT inhaler   Commonly known as: SYMBICORT   Inhale 2 puffs into the lungs 2 (two) times daily.      diltiazem 180 MG 24 hr capsule   Commonly known as: DILACOR XR   Take 180 mg by mouth at bedtime.      Flaxseed Oil 1000 MG Caps   Take 2,000 mg by mouth daily.      insulin aspart 100 UNIT/ML injection   Commonly known as: novoLOG   SLIDING SCALE  NOVOLOG INSULIN BEFORE BREAKFAST AND BEFORE DINNER:   FOR A BLOOD SUGAR OF 70-120 GIVE 0 UNITS.   FOR BLOOD SUGAR OF 121-150 GIVE 3 UNITS.   FOR BLOOD SUGAR OF 151-200 GIVE 4 UNITS.   FOR BLOOD SUGAR 201-250 GIVE 7 UNITS.   FOR BLOOD SUGAR 251-300 GIVE 11 UNITS.   FOR BLOOD SUGAR 301-350 GIVE 15 UNITS.   FOR BLOOD SUGAR 351-400 GIVE 20 UNITS.    FOR BLOOD SUGAR OVER 400 GIVE 25 UNITS AND CALL YOUR DOCTOR.      insulin glargine 100 UNIT/ML injection   Commonly known as: LANTUS   Inject 65 Units into the skin at  bedtime.      pantoprazole 40 MG tablet   Commonly known as: PROTONIX   Take 1 tablet (40 mg total) by mouth daily.      prasugrel 10 MG Tabs   Commonly known as: EFFIENT   Take 1 tablet (10 mg total) by mouth at bedtime.      PROAIR HFA 108 (90 BASE) MCG/ACT inhaler   Generic drug: albuterol   Inhale 2 puffs into the lungs every 6 (six) hours as needed. For shortness of breath      valsartan-hydrochlorothiazide 320-12.5 MG per tablet   Commonly known as: DIOVAN-HCT   Take 1 tablet by mouth at bedtime.           Follow-up Information    Follow up with LUKING,SCOTT, MD. In 3 weeks. (Followup as scheduled.)    Contact information:   [id number]  [place] [address] [phone]           The results of significant diagnostics from this hospitalization (including imaging, microbiology, ancillary and laboratory) are listed below for reference.    Significant Diagnostic Studies: Dg Chest Port 1 View  05/09/2012  *RADIOLOGY REPORT*  Clinical Data: Chest pain.  History of coronary artery disease with stenting.  PORTABLE CHEST - 1 VIEW 05/09/2012 1419 hours:  Comparison: Portable chest x-ray 10/08/2011, 09/12/2004.  Two-view chest x-ray 08/22/2011.  Findings: Suboptimal inspiration which accounts for atelectasis at the bases and accentuates the cardiac silhouette.  Taking this into account, cardiac silhouette upper normal in size to borderline enlarged but stable.  Hilar and mediastinal contours otherwise unremarkable.  Lungs otherwise clear.  IMPRESSION: Suboptimal inspiration accounts for mild bibasilar atelectasis.  No acute cardiopulmonary disease otherwise.   Original Report Authenticated By: Deniece Portela, M.D.     Microbiology: Recent Results (from the past 240 hour(s))  MRSA PCR SCREENING     Status: Normal   Collection Time   05/09/12  5:55 PM      Component Value Range Status Comment   MRSA by PCR NEGATIVE  NEGATIVE Final      Labs: Basic Metabolic  Panel:  Lab [id number] 05/10/12 0506 05/09/12 1415  NA 131* 130* 124*  K 3.7 3.7 4.5  CL 99 96 89*  CO2 24 24 23   GLUCOSE 243* 248* 674*  BUN 18 27* 29*  CREATININE 0.82 0.80 0.96  CALCIUM 8.7 9.0 9.5  MG -- -- --  PHOS -- -- --   Liver Function Tests:  Lab 05/09/12 1415  AST 15  ALT 17  ALKPHOS 118*  BILITOT 0.4  PROT 6.7  ALBUMIN 3.2*   No results found for this basename: LIPASE:5,AMYLASE:5 in the last 168 hours No results found for this basename: AMMONIA:5 in the last 168 hours CBC:  Lab 05/09/12 1415  WBC 4.9  NEUTROABS --  HGB 13.4  HCT 37.2*  MCV 86.9  PLT 187   Cardiac Enzymes:  Lab 05/09/12 2346 05/09/12 1809 05/09/12 1415  CKTOTAL -- -- --  CKMB -- -- --  CKMBINDEX -- -- --  TROPONINI <0.30 <0.30 <0.30   BNP: BNP (last 3 results)  Basename 05/25/11 2140  PROBNP 96.2   CBG:  Lab 05/11/12 0725 05/10/12 2310 05/10/12 1640 05/10/12 1100 05/10/12 0731  GLUCAP 225* 256* 281* 273* 263*    Time coordinating discharge: Greater than 30 minutes  Signed:  Karmelo Bass  [facility] 05/11/2012, 9:25 AM

## 2012-05-11 NOTE — Progress Notes (Signed)
Pt discharged with instructions and care notes.  He verbalized understanding.  Pt for got his prescription.  He was notified that I have it here at the nurses station.  He stated he would come by and pick it up.  Pt left the floor via w/c with staff in stable condition.

## 2012-05-14 ENCOUNTER — Telehealth: Payer: Self-pay | Admitting: Cardiology

## 2012-05-14 DIAGNOSIS — R609 Edema, unspecified: Secondary | ICD-10-CM

## 2012-05-14 NOTE — Telephone Encounter (Signed)
Patient states he cannot get shoes on, due to edema.  States that he does not have means to weigh himself.  Denies shortness of breath at this time.  Advised him to keep legs elevated as much as possible and I will call him with recommendations.

## 2012-05-14 NOTE — Telephone Encounter (Signed)
Patient states that is feet and legs are swelling. / tg

## 2012-05-15 ENCOUNTER — Encounter: Payer: Self-pay | Admitting: *Deleted

## 2012-05-15 MED ORDER — VERAPAMIL HCL ER 240 MG PO CP24: 240.0000 mg | ORAL_CAPSULE | Freq: Every day | ORAL | Status: DC

## 2012-05-15 MED ORDER — FUROSEMIDE 20 MG PO TABS: 20.0000 mg | ORAL_TABLET | Freq: Every day | ORAL | Status: DC

## 2012-05-15 NOTE — Telephone Encounter (Signed)
Patient notified of recommendations and appointment made for 9/30.  Advised him to call me back next week if he does not see improvement.  Verbalizes understanding.

## 2012-05-15 NOTE — Telephone Encounter (Signed)
Patient is overdue for plan return office visit-this will need to be scheduled in the first available appointment.  Change diltiazem to verapamil 240 mg per day.  Furosemide 20 mg per day. BMet in 2 weeks. Low-salt diet.

## 2012-05-20 ENCOUNTER — Ambulatory Visit (INDEPENDENT_AMBULATORY_CARE_PROVIDER_SITE_OTHER): Payer: PRIVATE HEALTH INSURANCE | Admitting: Internal Medicine

## 2012-05-20 ENCOUNTER — Encounter: Payer: Self-pay | Admitting: Internal Medicine

## 2012-05-20 VITALS — BP 129/76 | HR 74 | Temp 97.2°F | Ht 73.0 in | Wt 243.2 lb

## 2012-05-20 DIAGNOSIS — R1310 Dysphagia, unspecified: Secondary | ICD-10-CM

## 2012-05-20 DIAGNOSIS — K219 Gastro-esophageal reflux disease without esophagitis: Secondary | ICD-10-CM

## 2012-05-20 NOTE — Patient Instructions (Signed)
Schedule EGD with esophageal dilation no sooner than 06/10/12 (patient to stop taking Effient on 10/4)  GERD and gastroparesis information provided  Continue Protonix 40 mg once daily

## 2012-05-20 NOTE — Progress Notes (Signed)
Primary Care Physician:  [name], MD Primary Gastroenterologist:  Dr. Gala Romney  Pre-Procedure History & Physical: HPI:  Eugene [name] is a 56 y.o. male here for followup from cough an element of gastroesophageal reflux disease. Intermittent esophageal dysphagia. Nonspecific esophageal motility disorder on barium esophagram. No obstruction. Recently admitted with poorly controlled diabetes mellitus. He is back stating that Protonix is working extremely well for him. Particularly has improved cough.  Some vague esophageal dysphagia persists. Prior EGD with dilation in [place] help this swallowing significantly in the distant past.  Past Medical History  Diagnosis Date  . Arteriosclerotic cardiovascular disease (ASCVD)     Cath in 05/2011: Mild pulmonary hypertension, hyperdynamic LV, 50% LAD, severe first diagonal disease in a small vessel, 80% mid D2 also small, 40% circumflex, 50-60% RCA proximally and 99% distally-DES x2  . Hyperlipidemia   . Diabetes mellitus     Peripheral neuropathy  . Bell palsy   . Abnormal cardiac cath   . Hypertension   . COPD (chronic obstructive pulmonary disease)   . Sleep apnea   . Gallstones   . Nephrolithiasis   . GERD (gastroesophageal reflux disease) 05/10/2012    Past Surgical History  Procedure Date  . Circumcision   . Stents   . Esophagogastroduodenoscopy     in [place] over 20 yrs ago  . Colonoscopy     In [facility], approximately 2011 per patient, was normal. Advised to come back in 10 years.    Prior to Admission medications   Medication Sig Start Date End Date Taking? Authorizing Provider  albuterol (PROAIR HFA) 108 (90 BASE) MCG/ACT inhaler Inhale 2 puffs into the lungs every 6 (six) hours as needed. For shortness of breath   Yes Historical Provider, MD  aspirin EC 81 MG tablet Take 81 mg by mouth at bedtime.    Yes Historical Provider, MD  budesonide-formoterol (SYMBICORT) 160-4.5 MCG/ACT inhaler Inhale 2 puffs  into the lungs 2 (two) times daily.   Yes Historical Provider, MD  Flaxseed, Linseed, (FLAXSEED OIL) 1000 MG CAPS Take 2,000 mg by mouth daily.    Yes Historical Provider, MD  furosemide (LASIX) 20 MG tablet Take 1 tablet (20 mg total) by mouth daily. 05/15/12  Yes Yehuda Savannah, MD  insulin aspart (NOVOLOG) 100 UNIT/ML injection SLIDING SCALE  NOVOLOG INSULIN BEFORE BREAKFAST AND BEFORE DINNER:   FOR A BLOOD SUGAR OF 70-120 GIVE 0 UNITS.   FOR BLOOD SUGAR OF 121-150 GIVE 3 UNITS.   FOR BLOOD SUGAR OF 151-200 GIVE 4 UNITS.   FOR BLOOD SUGAR 201-250 GIVE 7 UNITS.   FOR BLOOD SUGAR 251-300 GIVE 11 UNITS.   FOR BLOOD SUGAR 301-350 GIVE 15 UNITS.   FOR BLOOD SUGAR 351-400 GIVE 20 UNITS.    FOR BLOOD SUGAR OVER 400 GIVE 25 UNITS AND CALL YOUR DOCTOR. 05/11/12  Yes Rexene Alberts, MD  insulin glargine (LANTUS) 100 UNIT/ML injection Inject 65 Units into the skin at bedtime. 05/11/12 05/11/13 Yes Rexene Alberts, MD  pantoprazole (PROTONIX) 40 MG tablet Take 1 tablet (40 mg total) by mouth daily. 04/24/12 04/24/13 Yes Mahala Menghini, PA  prasugrel (EFFIENT) 10 MG TABS Take 1 tablet (10 mg total) by mouth at bedtime. 05/11/12  Yes Rexene Alberts, MD  verapamil (VERELAN PM) 240 MG 24 hr capsule Take 1 capsule (240 mg total) by mouth at bedtime. 05/15/12  Yes Yehuda Savannah, MD  valsartan-hydrochlorothiazide (DIOVAN-HCT) 320-12.5 MG per tablet Take 1 tablet by mouth at bedtime.  Historical Provider, MD    Allergies as of 05/20/2012 - Review Complete 05/20/2012  Allergen Reaction Noted  . Hydrocodone Nausea And Vomiting 01/31/2012  . Statins Other (See Comments) 12/25/2011    Family History  Problem Relation Age of Onset  . Diabetes Mother   . Heart attack Mother   . Stroke Mother   . Diabetes Sister   . Sleep apnea Sister   . Hypertension Brother   . Diabetes Brother   . Colon cancer Neg Hx   . Liver disease Neg Hx     History   Social History  . Marital Status: Married    Spouse Name: N/A     Number of Children: 2  . Years of Education: N/A   Occupational History  . Shipping     [employer]   Social History Main Topics  . Smoking status: Never Smoker   . Smokeless tobacco: Not on file  . Alcohol Use: No     heavy etoh use 30 years ago  . Drug Use: No  . Sexually Active: Yes    Birth Control/ Protection: None   Other Topics Concern  . 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 Disabled at this point.    Review of Systems: See HPI, otherwise negative ROS  Physical Exam: BP 129/76  Pulse 74  Temp 97.2 F (36.2 C) (Temporal)  Ht 6\' 1"  (1.854 m)  Wt 243 lb 3.2 oz (110.315 kg)  BMI 32.09 kg/m2 General:   Alert,  obese  pleasant and cooperative in NAD Skin:  Intact without significant lesions or rashes. Eyes:  Sclera clear, no icterus.   Conjunctiva pink. Ears:  Normal auditory acuity. Nose:  No deformity, discharge,  or lesions. Mouth:  No deformity or lesions. Neck:  Supple; no masses or thyromegaly. No significant cervical adenopathy. Lungs:  Clear throughout to auscultation.   No wheezes, crackles, or rhonchi. No acute distress. Heart:  Regular rate and rhythm; no murmurs, clicks, rubs,  or gallops. Abdomen: Obese, normal bowel sounds.  Soft and nontender without appreciable mass or hepatosplenomegaly.  Pulses:  Normal pulses noted. Extremities:  Without clubbing or edema.  Impression/Plan:  Pleasant 56 year old gentleman with multiple medical problems including esophageal dysphagia. Coughing and reflux episode markedly improved on Protonix 40 mg orally daily. Nonspecific esophageal motility disorder seen on barium esophagram. Prior empiric dilation helped his dysphagia symptoms tremendously. I feel in this setting EGD with passage of a Maloney dilator may well help him once again. Poorly controlled diabetes likely exacerbating gastroparesis and may be contributing to the clinical picture. Good glycemic  control is imperative as part of the multipronged approach to reflux. He is stopping Effient as of  October 4. We will plan an EGD with dilation as appropriate/feasible around the middle of October.The risks, benefits, limitations, alternatives and imponderables have been reviewed with the patient. Potential for esophageal dilation, biopsy, etc. have also been reviewed.  Questions have been answered. All parties agreeable.

## 2012-05-26 ENCOUNTER — Ambulatory Visit: Payer: PRIVATE HEALTH INSURANCE | Admitting: Cardiology

## 2012-05-29 NOTE — Progress Notes (Signed)
UR chart review completed.  

## 2012-05-31 ENCOUNTER — Encounter (HOSPITAL_COMMUNITY): Payer: Self-pay | Admitting: *Deleted

## 2012-05-31 ENCOUNTER — Emergency Department (HOSPITAL_COMMUNITY)
Admission: EM | Admit: 2012-05-31 | Discharge: 2012-06-01 | Disposition: A | Discharge status: Home / Self Care | Payer: PRIVATE HEALTH INSURANCE | Attending: Emergency Medicine | Admitting: Emergency Medicine

## 2012-05-31 DIAGNOSIS — K8020 Calculus of gallbladder without cholecystitis without obstruction: Secondary | ICD-10-CM | POA: Insufficient documentation

## 2012-05-31 DIAGNOSIS — Z794 Long term (current) use of insulin: Secondary | ICD-10-CM | POA: Insufficient documentation

## 2012-05-31 DIAGNOSIS — R079 Chest pain, unspecified: Secondary | ICD-10-CM | POA: Insufficient documentation

## 2012-05-31 DIAGNOSIS — I10 Essential (primary) hypertension: Secondary | ICD-10-CM | POA: Insufficient documentation

## 2012-05-31 DIAGNOSIS — R609 Edema, unspecified: Secondary | ICD-10-CM | POA: Insufficient documentation

## 2012-05-31 DIAGNOSIS — R739 Hyperglycemia, unspecified: Secondary | ICD-10-CM

## 2012-05-31 DIAGNOSIS — E119 Type 2 diabetes mellitus without complications: Secondary | ICD-10-CM | POA: Insufficient documentation

## 2012-05-31 DIAGNOSIS — R0602 Shortness of breath: Secondary | ICD-10-CM | POA: Insufficient documentation

## 2012-05-31 DIAGNOSIS — E871 Hypo-osmolality and hyponatremia: Secondary | ICD-10-CM

## 2012-05-31 DIAGNOSIS — R1012 Left upper quadrant pain: Secondary | ICD-10-CM | POA: Insufficient documentation

## 2012-05-31 MED ORDER — HYDROMORPHONE HCL PF 1 MG/ML IJ SOLN
1.0000 mg | Freq: Once | INTRAMUSCULAR | Status: AC
  Administered 2012-05-31: 1 mg via INTRAVENOUS
  Filled 2012-05-31: qty 1

## 2012-05-31 NOTE — ED Provider Notes (Signed)
History   This chart was scribed for Eugene Acosta, MD scribed by Mitchell Heir. The patient was seen in room APA08/APA08 23:39   CSN: [id number]  Arrival date & time 05/31/12  2322    Chief Complaint  Patient presents with  . Chest Pain    (Consider location/radiation/quality/duration/timing/severity/associated sxs/prior treatment) The history is provided by the patient and medical records.   Eugene [name] is a 56 y.o. male who presents to the Emergency Department complaining of gradually worsening constant "twisting" pain in the left upper quadrant of the abdomen, onset this morning after he woke up (18 hours ago) with associated SOB and cough (mild and intermittent). Also reports looser stools past couple of days that he attributes to changed medication. Patient states pain is always present and notes there are no aggravating factors. Patient has hx of two stents, sleep apnea, frequent PNAs, HTN, and DM.Patient says his lower extremities swelling has been persistent, despite recent placement on Lasix by his cardiologist Dr. Lattie Haw.  Patient Has a history of tobacco abuse but quit years ago. Denies vomiting,bloody stools,and nausea.  Past Medical History  Diagnosis Date  . Arteriosclerotic cardiovascular disease (ASCVD)     Cath in 05/2011: Mild pulmonary hypertension, hyperdynamic LV, 50% LAD, severe first diagonal disease in a small vessel, 80% mid D2 also small, 40% circumflex, 50-60% RCA proximally and 99% distally-DES x2  . Hyperlipidemia   . Diabetes mellitus     Peripheral neuropathy  . Bell palsy   . Abnormal cardiac cath   . Hypertension   . COPD (chronic obstructive pulmonary disease)   . Sleep apnea   . Gallstones   . Nephrolithiasis   . GERD (gastroesophageal reflux disease) 05/10/2012    Past Surgical History  Procedure Date  . Circumcision   . Stents   . Esophagogastroduodenoscopy     in [place] over 20 yrs ago  . Colonoscopy     In [facility], approximately 2011 per patient, was normal. Advised to come back in 10 years.    Family History  Problem Relation Age of Onset  . Diabetes Mother   . Heart attack Mother   . Stroke Mother   . Diabetes Sister   . Sleep apnea Sister   . Hypertension Brother   . Diabetes Brother   . Colon cancer Neg Hx   . Liver disease Neg Hx     History  Substance Use Topics  . Smoking status: Never Smoker   . Smokeless tobacco: Not on file  . Alcohol Use: No     heavy etoh use 30 years ago      Review of Systems 10 Systems reviewed and are negative for acute change except as noted in the HPI. Allergies  Hydrocodone and Statins  Home Medications   Current Outpatient Rx  Name Route Sig Dispense Refill  . ALBUTEROL SULFATE HFA 108 (90 BASE) MCG/ACT IN AERS Inhalation Inhale 2 puffs into the lungs every 6 (six) hours as needed. For shortness of breath    . ALPRAZOLAM 0.5 MG PO TABS Oral Take 0.5 mg by mouth at bedtime as needed.    . ASPIRIN EC 81 MG PO TBEC Oral Take 81 mg by mouth at bedtime.     . BUDESONIDE-FORMOTEROL FUMARATE 160-4.5 MCG/ACT IN AERO Inhalation Inhale 2 puffs into the lungs 2 (two) times daily.    . CYCLOBENZAPRINE HCL 10 MG PO TABS Oral Take 10 mg by mouth 3 (three) times daily as needed.    [name]  FENOFIBRATE 160 MG PO TABS Oral Take 160 mg by mouth daily.    [name] FLAXSEED OIL 1000 MG PO CAPS Oral Take 2,000 mg by mouth daily.     . FUROSEMIDE 20 MG PO TABS Oral Take 1 tablet (20 mg total) by mouth daily. 30 tablet 11  . PANTOPRAZOLE SODIUM 40 MG PO TBEC Oral Take 1 tablet (40 mg total) by mouth daily. 30 tablet 3  . PRASUGREL HCL 10 MG PO TABS Oral Take 1 tablet (10 mg total) by mouth at bedtime. 30 tablet   . VERAPAMIL HCL ER 240 MG PO CP24 Oral Take 1 capsule (240 mg total) by mouth at bedtime. 30 capsule 11  . INSULIN ASPART 100 UNIT/ML [place] SOLN  SLIDING SCALE  NOVOLOG INSULIN BEFORE BREAKFAST AND BEFORE DINNER:   FOR A BLOOD SUGAR OF 70-120 GIVE 0 UNITS.   FOR  BLOOD SUGAR OF 121-150 GIVE 3 UNITS.   FOR BLOOD SUGAR OF 151-200 GIVE 4 UNITS.   FOR BLOOD SUGAR 201-250 GIVE 7 UNITS.   FOR BLOOD SUGAR 251-300 GIVE 11 UNITS.   FOR BLOOD SUGAR 301-350 GIVE 15 UNITS.   FOR BLOOD SUGAR 351-400 GIVE 20 UNITS.    FOR BLOOD SUGAR OVER 400 GIVE 25 UNITS AND CALL YOUR DOCTOR. 3 mL 2  . INSULIN GLARGINE 100 UNIT/ML [place] SOLN Subcutaneous Inject 65 Units into the skin at bedtime. 3 mL 2  . OXYCODONE-ACETAMINOPHEN 5-325 MG PO TABS Oral Take 1 tablet by mouth every 4 (four) hours as needed for pain. 10 tablet 0  . PROMETHAZINE HCL 25 MG PO TABS Oral Take 1 tablet (25 mg total) by mouth every 6 (six) hours as needed for nausea. 12 tablet 0    BP 207/92  Pulse 86  Temp 99.1 F (37.3 C) (Oral)  Resp 20  Ht 6\' 1"  (1.854 m)  Wt 243 lb (110.224 kg)  BMI 32.06 kg/m2  SpO2 97%  Physical Exam  Nursing note and vitals reviewed. Constitutional: He is oriented to person, place, and time. He appears well-developed and well-nourished. No distress.  HENT:  Head: Normocephalic and atraumatic.  Mouth/Throat: Oropharynx is clear and moist.  Eyes: EOM are normal. Pupils are equal, round, and reactive to light.  Neck: Normal range of motion. Neck supple. No tracheal deviation present.  Cardiovascular: Normal rate and regular rhythm.   No murmur heard.      Strong pulses at the radial arteries  Pulmonary/Chest: Effort normal. No respiratory distress. He has no wheezes. He has no rales. He exhibits no tenderness.       Lungs clear  Abdominal: There is tenderness.       Abd obese but tender in the LUQ - the patient states this is the same pain that he has been experiencing.  Musculoskeletal: Normal range of motion. He exhibits no edema and no tenderness.       2+ symmetrical pitting edema of the legs  Neurological: He is alert and oriented to person, place, and time. No cranial nerve deficit or sensory deficit.  Skin: Skin is warm and dry. No rash noted.  Psychiatric: He has a  normal mood and affect. His behavior is normal.    ED Course  Procedures (including critical care time) DIAGNOSTIC STUDIES: Oxygen Saturation is 97% on room air, normal by my interpretation.    COORDINATION OF CARE: Labs Reviewed  CBC WITH DIFFERENTIAL - Abnormal; Notable for the following:    HCT 37.3 (*)     All  other components within normal limits  COMPREHENSIVE METABOLIC PANEL - Abnormal; Notable for the following:    Sodium 129 (*)     Chloride 93 (*)     Glucose, Bld 392 (*)     BUN 25 (*)     Albumin 3.4 (*)     GFR calc non Af Amer 70 (*)     GFR calc Af Amer 81 (*)     All other components within normal limits  GLUCOSE, CAPILLARY - Abnormal; Notable for the following:    Glucose-Capillary 332 (*)     All other components within normal limits  GLUCOSE, CAPILLARY - Abnormal; Notable for the following:    Glucose-Capillary 288 (*)     All other components within normal limits  GLUCOSE, CAPILLARY - Abnormal; Notable for the following:    Glucose-Capillary 232 (*)     All other components within normal limits  LIPASE, BLOOD  APTT  PROTIME-INR  TROPONIN I   Ct Abdomen Pelvis W Contrast  06/01/2012  *RADIOLOGY REPORT*  Clinical Data: Left upper abdominal pain, chest pain.  CT ABDOMEN AND PELVIS WITH CONTRAST  Technique:  Multidetector CT imaging of the abdomen and pelvis was performed following the standard protocol during bolus administration of intravenous contrast.  Contrast: 115mL OMNIPAQUE IOHEXOL 300 MG/ML  SOLN  Comparison: 05/26/2011  Findings: There is some linear scarring or subsegmental atelectasis posteriorly in the visualized lung bases.  Patchy coronary calcifications.  Tiny pericardial effusion.  Small partially calcified stones in the fundus of the nondistended gallbladder. Unremarkable liver, spleen and accessory splenule, adrenal glands, kidneys, pancreas , abdominal aorta, portal vein.  Stomach is physiologically distended.  Small bowel is nondistended.   Normal appendix.  The colon is nondilated with a few scattered descending diverticula; no adjacent inflammatory/edematous change.  Urinary bladder is physiologically distended.  Mild prostatic enlargement.  Bilateral pelvic phleboliths.  No ascites.  No free air.  No adenopathy. Minimal spondylitic changes in the lumbar spine.  IMPRESSION:  1.  No acute abdominal process. 2.  Cholelithiasis. 3. Atherosclerosis, including . coronary artery disease. Please note that although the presence of coronary artery calcium documents the presence of coronary artery disease, the severity of this disease and any potential stenosis cannot be assessed on this non-gated CT examination.  Assessment for potential risk factor modification, dietary therapy or pharmacologic therapy may be warranted, if clinically indicated.   Original Report Authenticated By: Trecia Rogers, M.D.    Dg Abd Acute W/chest  06/01/2012  *RADIOLOGY REPORT*  Clinical Data: Left upper abdominal pain, history of nephrolithiasis  ACUTE ABDOMEN SERIES (ABDOMEN 2 VIEW & CHEST 1 VIEW)  Comparison: 05/09/2012 and earlier studies  Findings: The lungs are clear.  Heart size normal.  No effusion. No free air.  There are few gas filled nondilated small bowel loops in the right mid abdomen.  Normal distribution of gas and stool throughout colon.  No abnormal abdominal calcifications.  Regional bones unremarkable.  IMPRESSION:  1.  Nonobstructive bowel gas pattern. 2.  No free air. 3.  No acute cardiopulmonary disease.   Original Report Authenticated By: Dillard Cannon III, M.D.      1. Abdominal pain, left upper quadrant   2. Hyperglycemia   3. Hyponatremia   4. Cholelithiasis   5. Hypertension       MDM  The patient appears to have abdominal pain and not chest pain. His EKG does not show any signs of acute ischemia, initial tracing shows  a limb lead reversal but no other significant findings other than intraventricular conduction delay at 106.  There is no ST elevations or depressions and no T-wave abnormalities. This is consistent with a prior EKG from September 13 of this year.  Laboratory data was drawn and showed hyperglycemia, hyponatremia but a normal lipase, normal troponin and normal liver function. His blood counts are normal with a white count of 6.7, hemoglobin of 13.4 and normal platelets.initially an acute abdominal series was performed showing no obstruction or free air and no signs of pneumonia. CT scan was then ordered and showed no significant abdominal processes. The patient did have gallstones but no signs of cholecystitis and of note he is not tender in the right upper quadrant.  The patient's hyperglycemia was treated with several doses of insulin.  He was questioned regarding his high blood sugar and stated that he has not been taking his insulin as he is waiting for his next paycheck. I counseled him on the use of his medications and medication compliance and he has agreed to fill his medications today. He is taking his antihypertensive medications but did not take it this evening because he came to the hospital. Initially his blood pressure was significantly elevated but after treatment it came down nicely. His blood sugar was 230 prior to discharge.  On repeat exam the patient still had mild tenderness in the left upper quadrant but no pain at McBurney point, no pain in the left lower quadrant and no pain in the right upper quadrant. He is non-peritoneal and has no guarding or masses. His lung exam has remained clear, I have discussed with the patient his need for followup for repeat exam and he has agreed. At this time there does not appear to be any pathologic process that would mandate further evaluation treatment or admission to the hospital. He is pain-free on discharge  ED ECG REPORT  I personally interpreted this EKG   Date: 06/01/2012   Rate: 62  Rhythm: normal sinus rhythm  QRS Axis: normal  Intervals:  normal  ST/T Wave abnormalities: normal  Conduction Disutrbances:nonspecific intraventricular conduction delay  Narrative Interpretation:   Old EKG Reviewed: Compared with 05/09/2012, rate has slowed from 108-62. No other changes     I personally performed the services described in this documentation, which was scribed in my presence. The recorded information has been reviewed and considered.           Eugene Acosta, MD 06/01/12 [phone]

## 2012-06-01 ENCOUNTER — Emergency Department (HOSPITAL_COMMUNITY): Payer: PRIVATE HEALTH INSURANCE

## 2012-06-01 LAB — APTT: aPTT: 28 seconds (ref 24–37)

## 2012-06-01 LAB — CBC WITH DIFFERENTIAL/PLATELET
Basophils Absolute: 0 10*3/uL (ref 0.0–0.1)
Basophils Relative: 0 % (ref 0–1)
Lymphocytes Relative: 36 % (ref 12–46)
MCHC: 35.9 g/dL (ref 30.0–36.0)
Monocytes Absolute: 0.4 10*3/uL (ref 0.1–1.0)
Neutro Abs: 3.7 10*3/uL (ref 1.7–7.7)
Neutrophils Relative %: 55 % (ref 43–77)
Platelets: 193 10*3/uL (ref 150–400)
RDW: 13.2 % (ref 11.5–15.5)
WBC: 6.7 10*3/uL (ref 4.0–10.5)

## 2012-06-01 LAB — COMPREHENSIVE METABOLIC PANEL
ALT: 19 U/L (ref 0–53)
AST: 17 U/L (ref 0–37)
Albumin: 3.4 g/dL — ABNORMAL LOW (ref 3.5–5.2)
Chloride: 93 mEq/L — ABNORMAL LOW (ref 96–112)
Creatinine, Ser: 1.15 mg/dL (ref 0.50–1.35)
Potassium: 4.1 mEq/L (ref 3.5–5.1)
Sodium: 129 mEq/L — ABNORMAL LOW (ref 135–145)
Total Bilirubin: 0.4 mg/dL (ref 0.3–1.2)

## 2012-06-01 LAB — TROPONIN I: Troponin I: <0.3 ng/mL (ref <0.30)

## 2012-06-01 LAB — GLUCOSE, CAPILLARY: Glucose-Capillary: 332 mg/dL — ABNORMAL HIGH (ref 70–99)

## 2012-06-01 MED ORDER — INSULIN ASPART 100 UNIT/ML ~~LOC~~ SOLN
10.0000 [IU] | Freq: Once | SUBCUTANEOUS | Status: AC
  Administered 2012-06-01: 10 [IU] via SUBCUTANEOUS
  Filled 2012-06-01: qty 1

## 2012-06-01 MED ORDER — DILTIAZEM HCL ER COATED BEADS 180 MG PO CP24
180.0000 mg | ORAL_CAPSULE | Freq: Every day | ORAL | Status: DC
  Administered 2012-06-01: 180 mg via ORAL
  Filled 2012-06-01 (×2): qty 1

## 2012-06-01 MED ORDER — INSULIN ASPART 100 UNIT/ML ~~LOC~~ SOLN
10.0000 [IU] | Freq: Once | SUBCUTANEOUS | Status: AC
  Administered 2012-06-01: 10 [IU] via SUBCUTANEOUS

## 2012-06-01 MED ORDER — VERAPAMIL HCL ER 240 MG PO TBCR
EXTENDED_RELEASE_TABLET | ORAL | Status: AC
  Filled 2012-06-01: qty 1

## 2012-06-01 MED ORDER — IOHEXOL 300 MG/ML  SOLN
100.0000 mL | Freq: Once | INTRAMUSCULAR | Status: AC | PRN
  Administered 2012-06-01: 100 mL via INTRAVENOUS

## 2012-06-01 MED ORDER — PROMETHAZINE HCL 25 MG PO TABS: 25.0000 mg | ORAL_TABLET | Freq: Four times a day (QID) | ORAL | Status: DC | PRN

## 2012-06-01 MED ORDER — HYDROMORPHONE HCL PF 1 MG/ML IJ SOLN
1.0000 mg | Freq: Once | INTRAMUSCULAR | Status: AC
  Administered 2012-06-01: 1 mg via INTRAVENOUS
  Filled 2012-06-01: qty 1

## 2012-06-01 MED ORDER — DILTIAZEM HCL ER COATED BEADS 180 MG PO CP24
ORAL_CAPSULE | ORAL | Status: AC
  Filled 2012-06-01: qty 1

## 2012-06-01 MED ORDER — VERAPAMIL HCL 80 MG PO TABS
240.0000 mg | ORAL_TABLET | Freq: Once | ORAL | Status: AC
  Administered 2012-06-01: 240 mg via ORAL

## 2012-06-01 MED ORDER — OXYCODONE-ACETAMINOPHEN 5-325 MG PO TABS: 1.0000 | ORAL_TABLET | ORAL | Status: DC | PRN

## 2012-06-02 ENCOUNTER — Telehealth: Payer: Self-pay | Admitting: Cardiology

## 2012-06-02 NOTE — Telephone Encounter (Signed)
Attempted to contact patient. No answer at this time. 

## 2012-06-02 NOTE — Telephone Encounter (Signed)
PT BP STILL STAYING UP. HE STATES HE IS HOLDING FLUID SO BAD THAT HE CAN NOT PUT HIS SHOES ON.

## 2012-06-05 NOTE — Telephone Encounter (Signed)
Patient states that he saw Dr [name] and He started him on Diovan 320 mg and stopped chlorthalidone.

## 2012-06-10 ENCOUNTER — Encounter (HOSPITAL_COMMUNITY): Payer: Self-pay | Admitting: Pharmacy Technician

## 2012-06-11 MED ORDER — SODIUM CHLORIDE 0.45 % IV SOLN
INTRAVENOUS | Status: DC
  Administered 2012-06-12: 09:00:00 via INTRAVENOUS

## 2012-06-12 ENCOUNTER — Ambulatory Visit (HOSPITAL_COMMUNITY)
Admission: RE | Admit: 2012-06-12 | Discharge: 2012-06-12 | Disposition: A | Discharge status: Home / Self Care | Payer: PRIVATE HEALTH INSURANCE | Source: Ambulatory Visit | Attending: Internal Medicine | Admitting: Internal Medicine

## 2012-06-12 ENCOUNTER — Encounter (HOSPITAL_COMMUNITY): Payer: Self-pay | Admitting: *Deleted

## 2012-06-12 ENCOUNTER — Encounter (HOSPITAL_COMMUNITY)
Admission: RE | Disposition: A | Discharge status: Home / Self Care | Payer: Self-pay | Source: Ambulatory Visit | Attending: Internal Medicine

## 2012-06-12 DIAGNOSIS — Z01812 Encounter for preprocedural laboratory examination: Secondary | ICD-10-CM | POA: Insufficient documentation

## 2012-06-12 DIAGNOSIS — R1310 Dysphagia, unspecified: Secondary | ICD-10-CM | POA: Insufficient documentation

## 2012-06-12 DIAGNOSIS — J449 Chronic obstructive pulmonary disease, unspecified: Secondary | ICD-10-CM | POA: Insufficient documentation

## 2012-06-12 DIAGNOSIS — G4733 Obstructive sleep apnea (adult) (pediatric): Secondary | ICD-10-CM | POA: Insufficient documentation

## 2012-06-12 DIAGNOSIS — E119 Type 2 diabetes mellitus without complications: Secondary | ICD-10-CM | POA: Insufficient documentation

## 2012-06-12 DIAGNOSIS — E785 Hyperlipidemia, unspecified: Secondary | ICD-10-CM | POA: Insufficient documentation

## 2012-06-12 DIAGNOSIS — K2940 Chronic atrophic gastritis without bleeding: Secondary | ICD-10-CM | POA: Insufficient documentation

## 2012-06-12 DIAGNOSIS — K219 Gastro-esophageal reflux disease without esophagitis: Secondary | ICD-10-CM

## 2012-06-12 DIAGNOSIS — J4489 Other specified chronic obstructive pulmonary disease: Secondary | ICD-10-CM | POA: Insufficient documentation

## 2012-06-12 DIAGNOSIS — R933 Abnormal findings on diagnostic imaging of other parts of digestive tract: Secondary | ICD-10-CM

## 2012-06-12 HISTORY — PX: ESOPHAGOGASTRODUODENOSCOPY: SHX5428

## 2012-06-12 SURGERY — EGD (ESOPHAGOGASTRODUODENOSCOPY)
Anesthesia: Moderate Sedation

## 2012-06-12 MED ORDER — BUTAMBEN-TETRACAINE-BENZOCAINE 2-2-14 % EX AERO
INHALATION_SPRAY | CUTANEOUS | Status: DC | PRN
  Administered 2012-06-12: 2 via TOPICAL

## 2012-06-12 MED ORDER — MEPERIDINE HCL 100 MG/ML IJ SOLN
INTRAMUSCULAR | Status: DC | PRN
  Administered 2012-06-12: 50 mg via INTRAVENOUS

## 2012-06-12 MED ORDER — MIDAZOLAM HCL 5 MG/5ML IJ SOLN
INTRAMUSCULAR | Status: DC | PRN
  Administered 2012-06-12: 1 mg via INTRAVENOUS
  Administered 2012-06-12: 2 mg via INTRAVENOUS

## 2012-06-12 MED ORDER — MIDAZOLAM HCL 5 MG/5ML IJ SOLN
INTRAMUSCULAR | Status: AC
  Filled 2012-06-12: qty 10

## 2012-06-12 MED ORDER — MEPERIDINE HCL 100 MG/ML IJ SOLN
INTRAMUSCULAR | Status: AC
  Filled 2012-06-12: qty 1

## 2012-06-12 MED ORDER — STERILE WATER FOR IRRIGATION IR SOLN
Status: DC | PRN
  Administered 2012-06-12: 10:00:00

## 2012-06-12 MED ORDER — MEPERIDINE HCL 100 MG/ML IJ SOLN
INTRAMUSCULAR | Status: AC
  Filled 2012-06-12: qty 2

## 2012-06-12 NOTE — Op Note (Signed)
[facility] [address] [place], [address]   ENDOSCOPY PROCEDURE REPORT  PATIENT: Eugene [name], Eugene [name]  MR#: [id number] BIRTHDATE: Jan 04, 1956 , 60  yrs. old GENDER: Male ENDOSCOPIST:  R.  Garfield Cornea, MD FACP FACG REFERRED BY:  Rosemary Holms, M.D.  [name], M.D. PROCEDURE DATE:  06/12/2012 PROCEDURE:     EGD with Venia Minks dilation followed by gastric biopsy  INDICATIONS:    GERD; esophageal dysphagia  INFORMED CONSENT:   The risks, benefits, limitations, alternatives and imponderables have been discussed.  The potential for biopsy, esophogeal dilation, etc. have also been reviewed.  Questions have been answered.  All parties agreeable.  Please see the history and physical in the medical record for more information.  MEDICATIONS:     Versed 3 mg IV and Demerol 50 mg IV in divided doses. Cetacaine spray.  DESCRIPTION OF PROCEDURE:   The [id number]  endoscope was introduced through the mouth and advanced to the second portion of the duodenum without difficulty or limitations.  The mucosal surfaces were surveyed very carefully during advancement of the scope and upon withdrawal.  Retroflexion view of the proximal stomach and esophagogastric junction was performed.      FINDINGS: normal-appearing, patent tubular esophagus throughout its course. Normal stomach. Somewhat mosaic-appearing gastric mucosa without ulcer or infiltrating process seen. Patent pylorus. Normal first and second portion of the duodenum  THERAPEUTIC / DIAGNOSTIC MANEUVERS PERFORMED:  A 56 French Maloney dilator was passed to full insertion easily. A look back revealed no apparent complication related to this maneuver. Subsequently, biopsies of the abnormal gastric mucosa were taken for histologic study.   COMPLICATIONS:  None  IMPRESSION:  Normal esophagus-status post Maloney dilation. Abnormal gastric mucosa of uncertain significance-status post  biopsy.  RECOMMENDATIONS:  Continue Protonix 40 mg daily. Followup on pathology.    _______________________________ R. Garfield Cornea, MD FACP [facility] eSigned:  R. Garfield Cornea, MD FACP [facility] 06/12/2012 10:06 AM     CC:

## 2012-06-12 NOTE — Interval H&P Note (Signed)
History and Physical Interval Note:  06/12/2012 9:43 AM  Eugene [name]  has presented today for surgery, with the diagnosis of GERD AND DYSPHAGIA  The various methods of treatment have been discussed with the patient and family. After consideration of risks, benefits and other options for treatment, the patient has consented to  Procedure(s) (LRB) with comments: ESOPHAGOGASTRODUODENOSCOPY (EGD) (N/A) - 9:45 as a surgical intervention .  The patient's history has been reviewed, patient examined, no change in status, stable for surgery.  I have reviewed the patient's chart and labs.  Questions were answered to the patient's satisfaction.     Manus Rudd

## 2012-06-12 NOTE — H&P (View-Only) (Signed)
Primary Care Physician:  [name], MD Primary Gastroenterologist:  Dr. Gala Romney  Pre-Procedure History & Physical: HPI:  Eugene [name] is a 56 y.o. male here for followup from cough an element of gastroesophageal reflux disease. Intermittent esophageal dysphagia. Nonspecific esophageal motility disorder on barium esophagram. No obstruction. Recently admitted with poorly controlled diabetes mellitus. He is back stating that Protonix is working extremely well for him. Particularly has improved cough.  Some vague esophageal dysphagia persists. Prior EGD with dilation in [place] help this swallowing significantly in the distant past.  Past Medical History  Diagnosis Date  . Arteriosclerotic cardiovascular disease (ASCVD)     Cath in 05/2011: Mild pulmonary hypertension, hyperdynamic LV, 50% LAD, severe first diagonal disease in a small vessel, 80% mid D2 also small, 40% circumflex, 50-60% RCA proximally and 99% distally-DES x2  . Hyperlipidemia   . Diabetes mellitus     Peripheral neuropathy  . Bell palsy   . Abnormal cardiac cath   . Hypertension   . COPD (chronic obstructive pulmonary disease)   . Sleep apnea   . Gallstones   . Nephrolithiasis   . GERD (gastroesophageal reflux disease) 05/10/2012    Past Surgical History  Procedure Date  . Circumcision   . Stents   . Esophagogastroduodenoscopy     in [place] over 20 yrs ago  . Colonoscopy     In [facility], approximately 2011 per patient, was normal. Advised to come back in 10 years.    Prior to Admission medications   Medication Sig Start Date End Date Taking? Authorizing Provider  albuterol (PROAIR HFA) 108 (90 BASE) MCG/ACT inhaler Inhale 2 puffs into the lungs every 6 (six) hours as needed. For shortness of breath   Yes Historical Provider, MD  aspirin EC 81 MG tablet Take 81 mg by mouth at bedtime.    Yes Historical Provider, MD  budesonide-formoterol (SYMBICORT) 160-4.5 MCG/ACT inhaler Inhale 2 puffs  into the lungs 2 (two) times daily.   Yes Historical Provider, MD  Flaxseed, Linseed, (FLAXSEED OIL) 1000 MG CAPS Take 2,000 mg by mouth daily.    Yes Historical Provider, MD  furosemide (LASIX) 20 MG tablet Take 1 tablet (20 mg total) by mouth daily. 05/15/12  Yes Yehuda Savannah, MD  insulin aspart (NOVOLOG) 100 UNIT/ML injection SLIDING SCALE  NOVOLOG INSULIN BEFORE BREAKFAST AND BEFORE DINNER:   FOR A BLOOD SUGAR OF 70-120 GIVE 0 UNITS.   FOR BLOOD SUGAR OF 121-150 GIVE 3 UNITS.   FOR BLOOD SUGAR OF 151-200 GIVE 4 UNITS.   FOR BLOOD SUGAR 201-250 GIVE 7 UNITS.   FOR BLOOD SUGAR 251-300 GIVE 11 UNITS.   FOR BLOOD SUGAR 301-350 GIVE 15 UNITS.   FOR BLOOD SUGAR 351-400 GIVE 20 UNITS.    FOR BLOOD SUGAR OVER 400 GIVE 25 UNITS AND CALL YOUR DOCTOR. 05/11/12  Yes Rexene Alberts, MD  insulin glargine (LANTUS) 100 UNIT/ML injection Inject 65 Units into the skin at bedtime. 05/11/12 05/11/13 Yes Rexene Alberts, MD  pantoprazole (PROTONIX) 40 MG tablet Take 1 tablet (40 mg total) by mouth daily. 04/24/12 04/24/13 Yes Mahala Menghini, PA  prasugrel (EFFIENT) 10 MG TABS Take 1 tablet (10 mg total) by mouth at bedtime. 05/11/12  Yes Rexene Alberts, MD  verapamil (VERELAN PM) 240 MG 24 hr capsule Take 1 capsule (240 mg total) by mouth at bedtime. 05/15/12  Yes Yehuda Savannah, MD  valsartan-hydrochlorothiazide (DIOVAN-HCT) 320-12.5 MG per tablet Take 1 tablet by mouth at bedtime.  Historical Provider, MD    Allergies as of 05/20/2012 - Review Complete 05/20/2012  Allergen Reaction Noted  . Hydrocodone Nausea And Vomiting 01/31/2012  . Statins Other (See Comments) 12/25/2011    Family History  Problem Relation Age of Onset  . Diabetes Mother   . Heart attack Mother   . Stroke Mother   . Diabetes Sister   . Sleep apnea Sister   . Hypertension Brother   . Diabetes Brother   . Colon cancer Neg Hx   . Liver disease Neg Hx     History   Social History  . Marital Status: Married    Spouse Name: N/A     Number of Children: 2  . Years of Education: N/A   Occupational History  . Shipping     [employer]   Social History Main Topics  . Smoking status: Never Smoker   . Smokeless tobacco: Not on file  . Alcohol Use: No     heavy etoh use 30 years ago  . Drug Use: No  . Sexually Active: Yes    Birth Control/ Protection: None   Other Topics Concern  . 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 Disabled at this point.    Review of Systems: See HPI, otherwise negative ROS  Physical Exam: BP 129/76  Pulse 74  Temp 97.2 F (36.2 C) (Temporal)  Ht 6\' 1"  (1.854 m)  Wt 243 lb 3.2 oz (110.315 kg)  BMI 32.09 kg/m2 General:   Alert,  obese  pleasant and cooperative in NAD Skin:  Intact without significant lesions or rashes. Eyes:  Sclera clear, no icterus.   Conjunctiva pink. Ears:  Normal auditory acuity. Nose:  No deformity, discharge,  or lesions. Mouth:  No deformity or lesions. Neck:  Supple; no masses or thyromegaly. No significant cervical adenopathy. Lungs:  Clear throughout to auscultation.   No wheezes, crackles, or rhonchi. No acute distress. Heart:  Regular rate and rhythm; no murmurs, clicks, rubs,  or gallops. Abdomen: Obese, normal bowel sounds.  Soft and nontender without appreciable mass or hepatosplenomegaly.  Pulses:  Normal pulses noted. Extremities:  Without clubbing or edema.  Impression/Plan:  Pleasant 56 year old gentleman with multiple medical problems including esophageal dysphagia. Coughing and reflux episode markedly improved on Protonix 40 mg orally daily. Nonspecific esophageal motility disorder seen on barium esophagram. Prior empiric dilation helped his dysphagia symptoms tremendously. I feel in this setting EGD with passage of a Maloney dilator may well help him once again. Poorly controlled diabetes likely exacerbating gastroparesis and may be contributing to the clinical picture. Good glycemic  control is imperative as part of the multipronged approach to reflux. He is stopping Effient as of  October 4. We will plan an EGD with dilation as appropriate/feasible around the middle of October.The risks, benefits, limitations, alternatives and imponderables have been reviewed with the patient. Potential for esophageal dilation, biopsy, etc. have also been reviewed.  Questions have been answered. All parties agreeable.

## 2012-06-15 ENCOUNTER — Encounter: Payer: Self-pay | Admitting: Internal Medicine

## 2012-06-16 ENCOUNTER — Encounter (HOSPITAL_COMMUNITY): Payer: Self-pay | Admitting: *Deleted

## 2012-06-16 ENCOUNTER — Telehealth: Payer: Self-pay | Admitting: Physician Assistant

## 2012-06-16 ENCOUNTER — Other Ambulatory Visit: Payer: Self-pay

## 2012-06-16 ENCOUNTER — Emergency Department (HOSPITAL_COMMUNITY): Payer: PRIVATE HEALTH INSURANCE

## 2012-06-16 ENCOUNTER — Emergency Department (HOSPITAL_COMMUNITY)
Admission: EM | Admit: 2012-06-16 | Discharge: 2012-06-17 | Disposition: A | Discharge status: Home / Self Care | Payer: PRIVATE HEALTH INSURANCE | Attending: Emergency Medicine | Admitting: Emergency Medicine

## 2012-06-16 ENCOUNTER — Encounter: Payer: Self-pay | Admitting: *Deleted

## 2012-06-16 DIAGNOSIS — Z7982 Long term (current) use of aspirin: Secondary | ICD-10-CM | POA: Insufficient documentation

## 2012-06-16 DIAGNOSIS — E1149 Type 2 diabetes mellitus with other diabetic neurological complication: Secondary | ICD-10-CM | POA: Insufficient documentation

## 2012-06-16 DIAGNOSIS — R0989 Other specified symptoms and signs involving the circulatory and respiratory systems: Secondary | ICD-10-CM | POA: Insufficient documentation

## 2012-06-16 DIAGNOSIS — R0609 Other forms of dyspnea: Secondary | ICD-10-CM | POA: Insufficient documentation

## 2012-06-16 DIAGNOSIS — I10 Essential (primary) hypertension: Secondary | ICD-10-CM | POA: Insufficient documentation

## 2012-06-16 DIAGNOSIS — I2510 Atherosclerotic heart disease of native coronary artery without angina pectoris: Secondary | ICD-10-CM | POA: Insufficient documentation

## 2012-06-16 DIAGNOSIS — I509 Heart failure, unspecified: Secondary | ICD-10-CM | POA: Insufficient documentation

## 2012-06-16 DIAGNOSIS — Z794 Long term (current) use of insulin: Secondary | ICD-10-CM | POA: Insufficient documentation

## 2012-06-16 DIAGNOSIS — N200 Calculus of kidney: Secondary | ICD-10-CM | POA: Insufficient documentation

## 2012-06-16 DIAGNOSIS — E785 Hyperlipidemia, unspecified: Secondary | ICD-10-CM | POA: Insufficient documentation

## 2012-06-16 DIAGNOSIS — R609 Edema, unspecified: Secondary | ICD-10-CM | POA: Insufficient documentation

## 2012-06-16 DIAGNOSIS — K8020 Calculus of gallbladder without cholecystitis without obstruction: Secondary | ICD-10-CM | POA: Insufficient documentation

## 2012-06-16 DIAGNOSIS — K219 Gastro-esophageal reflux disease without esophagitis: Secondary | ICD-10-CM | POA: Insufficient documentation

## 2012-06-16 DIAGNOSIS — J4489 Other specified chronic obstructive pulmonary disease: Secondary | ICD-10-CM | POA: Insufficient documentation

## 2012-06-16 DIAGNOSIS — R0600 Dyspnea, unspecified: Secondary | ICD-10-CM

## 2012-06-16 DIAGNOSIS — J449 Chronic obstructive pulmonary disease, unspecified: Secondary | ICD-10-CM | POA: Insufficient documentation

## 2012-06-16 LAB — CBC
MCH: 30.3 pg (ref 26.0–34.0)
MCHC: 35.7 g/dL (ref 30.0–36.0)
MCV: 84.8 fL (ref 78.0–100.0)
Platelets: 205 10*3/uL (ref 150–400)
RBC: 4 MIL/uL — ABNORMAL LOW (ref 4.22–5.81)

## 2012-06-16 LAB — COMPREHENSIVE METABOLIC PANEL
AST: 13 U/L (ref 0–37)
Albumin: 2.9 g/dL — ABNORMAL LOW (ref 3.5–5.2)
BUN: 18 mg/dL (ref 6–23)
Calcium: 9.8 mg/dL (ref 8.4–10.5)
Chloride: 98 mEq/L (ref 96–112)
Creatinine, Ser: 0.71 mg/dL (ref 0.50–1.35)
GFR calc non Af Amer: >90 mL/min (ref >90)
Total Bilirubin: 0.4 mg/dL (ref 0.3–1.2)

## 2012-06-16 LAB — PRO B NATRIURETIC PEPTIDE: Pro B Natriuretic peptide (BNP): 360.1 pg/mL — ABNORMAL HIGH (ref 0–125)

## 2012-06-16 LAB — MAGNESIUM: Magnesium: 1.6 mg/dL (ref 1.5–2.5)

## 2012-06-16 MED ORDER — ALBUTEROL SULFATE (5 MG/ML) 0.5% IN NEBU
2.5000 mg | INHALATION_SOLUTION | RESPIRATORY_TRACT | Status: DC
  Administered 2012-06-16: 2.5 mg via RESPIRATORY_TRACT
  Filled 2012-06-16: qty 0.5

## 2012-06-16 MED ORDER — NITROGLYCERIN 0.4 MG SL SUBL: 0.4000 mg | SUBLINGUAL_TABLET | SUBLINGUAL | Status: DC | PRN

## 2012-06-16 MED ORDER — FUROSEMIDE 10 MG/ML IJ SOLN
60.0000 mg | Freq: Once | INTRAMUSCULAR | Status: AC
  Administered 2012-06-16: 60 mg via INTRAVENOUS
  Filled 2012-06-16: qty 6

## 2012-06-16 MED ORDER — FENTANYL CITRATE 0.05 MG/ML IJ SOLN
50.0000 ug | Freq: Once | INTRAMUSCULAR | Status: AC
  Administered 2012-06-16: 50 ug via INTRAVENOUS
  Filled 2012-06-16: qty 2

## 2012-06-16 MED ORDER — IPRATROPIUM BROMIDE 0.02 % IN SOLN
0.5000 mg | RESPIRATORY_TRACT | Status: DC
  Administered 2012-06-16: 0.5 mg via RESPIRATORY_TRACT
  Filled 2012-06-16: qty 2.5

## 2012-06-16 MED ORDER — ONDANSETRON HCL 4 MG/2ML IJ SOLN
4.0000 mg | Freq: Once | INTRAMUSCULAR | Status: AC
  Administered 2012-06-16: 4 mg via INTRAVENOUS
  Filled 2012-06-16: qty 2

## 2012-06-16 MED ORDER — ASPIRIN 81 MG PO CHEW
324.0000 mg | CHEWABLE_TABLET | Freq: Once | ORAL | Status: AC
  Administered 2012-06-16: 324 mg via ORAL
  Filled 2012-06-16: qty 4

## 2012-06-16 NOTE — Telephone Encounter (Addendum)
Called by pt because he has been having headaches, his BP is high and his legs are swelling. His weight is up about 12 lbs.  Advised him to call 911 and come in if he feels the HA is getting worse but pt feels it is from his BP, which has been > 170 at times.   Advised him he might need to come in for diuresis but he prefers to try to manage it at home.   Therefore, advised him to take 2 extra Lasix 20 mg tabs tonight. If this works, he will have to get up a lot to urinate but he should lose some fluid and his BP/HA should get better. The office should call him in am to get him in to be seen and get a BMET in a few days. If his symptoms do not improve, he should call 911 and come in for eval/treatment.Pt agreed but prefers outpatient mgt if at all possible.   Pt had OP ER visit, see notes, called office to make sure he had f/u appt.

## 2012-06-16 NOTE — ED Provider Notes (Signed)
History   This chart was scribed for Rhunette Croft, MD by Hampton Abbot. This patient was seen in room APA18/APA18 and the patient's care was started at 21:49.   CSN: [id number]  Arrival date & time 06/16/12  2106   First MD Initiated Contact with Patient 06/16/12 2148      Chief Complaint  Patient presents with  . Chest Pain    (Consider location/radiation/quality/duration/timing/severity/associated sxs/prior treatment) The history is provided by the patient. No language interpreter was used.   Eugene [name] is a 56 y.o. male who presents to the Emergency Department complaining of sudden onset chest pain 1.5 hours ago with associated nausea, dyspnea, and non-productive cough that worsens pain.  Pt further reports a severe, non-radiating HA beginning this morning.  Per pt, he has gained 12-15lbs in past 2 weeks.  Pt denies fever, neck pain, sore throat, visual disturbance, abdominal pain, emesis, diarrhea, urinary symptoms, back pain, weakness, numbness and rash as associated symptoms.  Pt has h/o DM, HTN, COPD, and GERD.  Pt denies tobacco and alcohol use.    Past Medical History  Diagnosis Date  . Arteriosclerotic cardiovascular disease (ASCVD)     Cath in 05/2011: Mild pulmonary hypertension, hyperdynamic LV, 50% LAD, severe first diagonal disease in a small vessel, 80% mid D2 also small, 40% circumflex, 50-60% RCA proximally and 99% distally-DES x2  . Hyperlipidemia   . Diabetes mellitus     Peripheral neuropathy  . Bell palsy   . Abnormal cardiac cath   . Hypertension   . COPD (chronic obstructive pulmonary disease)   . Sleep apnea   . Gallstones   . GERD (gastroesophageal reflux disease) 05/10/2012  . Nephrolithiasis     Past Surgical History  Procedure Date  . Circumcision   . Stents   . Esophagogastroduodenoscopy     in [place] over 20 yrs ago  . Colonoscopy     In [facility], approximately 2011 per patient, was normal. Advised to come back in 10  years.    Family History  Problem Relation Age of Onset  . Diabetes Mother   . Heart attack Mother   . Stroke Mother   . Diabetes Sister   . Sleep apnea Sister   . Hypertension Brother   . Diabetes Brother   . Colon cancer Neg Hx   . Liver disease Neg Hx     History  Substance Use Topics  . Smoking status: Never Smoker   . Smokeless tobacco: Not on file  . Alcohol Use: No     heavy etoh use 30 years ago      Review of Systems REVIEW OF SYSTEMS:   1.) CONSTITUTIONAL: No fever, chills or systemic signs of infection. Reported 12-15lb weight gain over past 2 weeks.   2.) HEENT: No facial pain, sinus congestion or rhinorrhea is reported. Patient is denying any acute visual or hearing deficits. No sore throat or difficulty swallowing.  3.) NECK: No swelling or masses are reported.   4.) PULMONARY: Positive for cough and dyspnea.  5.) CARDIAC: No palpitations.  Positive for chest pain.     6.) ABDOMINAL: Denies abdominal pain, vomiting or diarrhea. No Hematochezia or melena.  7.) GENITOURINARY: No burning with urination or frequency. No discharge.  8.) BACK: Denying any flank or CVA tenderness. No specific thoracic or lumbar pain.   9.) EXTREMITIES: Positive for lower extremity edema.  10.) NEUROLOGIC: Denying any focal or lateralizing neurologic impairments.     11.) SKIN: No  rashes, itching  12.) HEME/LYMPH: No easy bruising/bleeding, no lymphadenopathy  Allergies  Hydrocodone and Statins  Home Medications   Current Outpatient Rx  Name Route Sig Dispense Refill  . ALBUTEROL SULFATE HFA 108 (90 BASE) MCG/ACT IN AERS Inhalation Inhale 2 puffs into the lungs every 6 (six) hours as needed. For shortness of breath    . ALPRAZOLAM 0.5 MG PO TABS Oral Take 0.5 mg by mouth at bedtime as needed. Sleep aid    . ASPIRIN EC 81 MG PO TBEC Oral Take 81 mg by mouth at bedtime.     . BUDESONIDE-FORMOTEROL FUMARATE 160-4.5 MCG/ACT IN AERO Inhalation Inhale 2 puffs into the  lungs 2 (two) times daily.    [name] FLAXSEED OIL 1000 MG PO CAPS Oral Take 2,000 mg by mouth daily.     . FUROSEMIDE 20 MG PO TABS Oral Take 1 tablet (20 mg total) by mouth daily. 30 tablet 11  . INSULIN ASPART 100 UNIT/ML [place] SOLN Subcutaneous Inject 3-25 Units into the skin 2 (two) times daily. SLIDING SCALE  NOVOLOG INSULIN BEFORE BREAKFAST AND BEFORE DINNER:   FOR A BLOOD SUGAR OF 70-120 GIVE 0 UNITS.   FOR BLOOD SUGAR OF 121-150 GIVE 3 UNITS.   FOR BLOOD SUGAR OF 151-200 GIVE 4 UNITS.   FOR BLOOD SUGAR 201-250 GIVE 7 UNITS.   FOR BLOOD SUGAR 251-300 GIVE 11 UNITS.   FOR BLOOD SUGAR 301-350 GIVE 15 UNITS.   FOR BLOOD SUGAR 351-400 GIVE 20 UNITS.    FOR BLOOD SUGAR OVER 400 GIVE 25 UNITS AND CALL YOUR DOCTOR.    . INSULIN GLARGINE 100 UNIT/ML [place] SOLN Subcutaneous Inject 65 Units into the skin at bedtime. 3 mL 2  . PANTOPRAZOLE SODIUM 40 MG PO TBEC Oral Take 1 tablet (40 mg total) by mouth daily. 30 tablet 3  . PROMETHAZINE HCL 25 MG PO TABS Oral Take 1 tablet (25 mg total) by mouth every 6 (six) hours as needed for nausea. 12 tablet 0  . VALSARTAN 320 MG PO TABS Oral Take 320 mg by mouth daily.    [name] VERAPAMIL HCL ER 240 MG PO CP24 Oral Take 1 capsule (240 mg total) by mouth at bedtime. 30 capsule 11    BP 178/100  Pulse 85  Temp 98.1 F (36.7 C) (Oral)  Resp 22  Ht 6\' 1"  (1.854 m)  Wt 243 lb (110.224 kg)  BMI 32.06 kg/m2  SpO2 97%  Physical Exam  Nursing notes reviewed.  Electronic medical record reviewed. VITAL SIGNS:   Filed Vitals:   06/16/12 2113  BP: 178/100  Pulse: 85  Temp: 98.1 F (36.7 C)  TempSrc: Oral  Resp: 22  Height: 6\' 1"  (1.854 m)  Weight: 243 lb (110.224 kg)  SpO2: 97%   CONSTITUTIONAL: Awake, oriented, appears non-toxic HENT: Atraumatic, normocephalic, oral mucosa pink and moist, airway patent. Nares patent without drainage. External ears normal. EYES: Conjunctiva clear, EOMI, PERRLA NECK: Trachea midline, non-tender, supple CARDIOVASCULAR: Normal heart rate,  Normal rhythm, No murmurs, rubs, gallops PULMONARY/CHEST: Clear to auscultation, no rhonchi, wheezes, or rales. Symmetrical breath sounds. Non-tender.   ABDOMINAL: Non-distended, soft, non-tender - no rebound or guarding.  BS normal. NEUROLOGIC: Non-focal, moving all four extremities, no gross sensory or motor deficits. EXTREMITIES: No clubbing, cyanosis.  2+ lower extremity edema bilaterally.   SKIN: Warm, Dry, No erythema, No rash  ED Course  Procedures (including critical care time)  Date: 06/17/2012  Rate: 84  Rhythm: normal sinus rhythm  QRS Axis: normal  Intervals: normal  ST/T Wave abnormalities: normal  Conduction Disutrbances: Incomplete right bundle branch block  Narrative Interpretation: Old septal infarct; no new ST or T wave abnormalities suggestive of ischemia or infarction. No significant change from 06/01/2012.  DIAGNOSTIC STUDIES: Oxygen Saturation is 97% on room air, adequate by my interpretation.    COORDINATION OF CARE: 21:57- Patient informed of clinical course, understands medical decision-making process, and agrees with plan.  Ordered breathing treatment, aspirin, sublimaze, zofran, nitrostat, CBC, pro b natriuretic peptide, c-met, magnesium, and chest XR.      Labs Reviewed - No data to display Results for orders placed during the hospital encounter of 06/16/12  CBC      Component Value Range   WBC 6.6  4.0 - 10.5 K/uL   RBC 4.00 (*) 4.22 - 5.81 MIL/uL   Hemoglobin 12.1 (*) 13.0 - 17.0 g/dL   HCT 33.9 (*) 39.0 - 52.0 %   MCV 84.8  78.0 - 100.0 fL   MCH 30.3  26.0 - 34.0 pg   MCHC 35.7  30.0 - 36.0 g/dL   RDW 12.6  11.5 - 15.5 %   Platelets 205  150 - 400 K/uL  PRO B NATRIURETIC PEPTIDE      Component Value Range   Pro B Natriuretic peptide (BNP) 360.1 (*) 0 - 125 pg/mL  COMPREHENSIVE METABOLIC PANEL      Component Value Range   Sodium 134 (*) 135 - 145 mEq/L   Potassium 4.1  3.5 - 5.1 mEq/L   Chloride 98  96 - 112 mEq/L   CO2 28  19 - 32 mEq/L    Glucose, Bld 248 (*) 70 - 99 mg/dL   BUN 18  6 - 23 mg/dL   Creatinine, Ser 0.71  0.50 - 1.35 mg/dL   Calcium 9.8  8.4 - 10.5 mg/dL   Total Protein 6.7  6.0 - 8.3 g/dL   Albumin 2.9 (*) 3.5 - 5.2 g/dL   AST 13  0 - 37 U/L   ALT 16  0 - 53 U/L   Alkaline Phosphatase 96  39 - 117 U/L   Total Bilirubin 0.4  0.3 - 1.2 mg/dL   GFR calc non Af Amer >90  >90 mL/min   GFR calc Af Amer >90  >90 mL/min  MAGNESIUM      Component Value Range   Magnesium 1.6  1.5 - 2.5 mg/dL    Dg Chest 2 View  06/16/2012  *RADIOLOGY REPORT*  Clinical Data: Chest pain  CHEST - 2 VIEW  Comparison: 05/09/2012  Findings: Heart size upper normal to mildly enlarged.  Mild central vascular congestion.  Mild bibasilar opacities. No pleural effusion or pneumothorax.  No acute osseous finding.  Multilevel degenerative change.  IMPRESSION: Are cardiomegaly with central vascular congestion.  No overt edema.  Mild bibasilar opacities; atelectasis versus infiltrate.   Original Report Authenticated By: Suanne Marker, M.D.      1. CHF (congestive heart failure)   2. Edema, peripheral   3. Dyspnea     MDM  Eugene [name] is a 56 y.o. male history of coronary stenting a year ago in October. Patient presents with shortness of breath and some atypical chest pain - the chest pain is actually more related to his cough. Patient has had an increased weight gain over the past couple of weeks, increased orthopnea, increasing shortness of breath on exertion. Patient has had some modifications to his dictation list including the addition of Diovan - however  he's only been taking 20 mg of Lasix daily.  Suspect mild volume overload. Patient's blood pressure is elevated despite his taking all of his medications prior to arrival tonight. 10 cardiac biomarkers, chest x-ray EKG. EKG is unchanged - nonischemic at this time.  Patient feeling better with single dose of Lasix. Troponin is within normal limits-BNP is mildly elevated at 360.  Patient is not having any chest pain I think is related to his heart. I do not think his shortness of breath is an anginal equivalent, I think it is a gradual onset and associated with his increased volume - his blood pressure is increased however there is no demand ischemia seen on EKG.  Patient initially wanted to stay in hospital for "further testing" however on further discussion with me and Dr. Megan Salon, patient will return home and followup with his primary care physician Dr. [name] and his cardiologist in 2 days. Patient will take 60 mg of Lasix tomorrow morning in followup with primary care for further modifications of his regimen.  I explained the diagnosis and have given explicit precautions to return to the ER including chest pain, worsening shortness of breath or any other new or worsening symptoms. The patient understands and accepts the medical plan as it's been dictated and I have answered their questions. Discharge instructions concerning home care and prescriptions have been given.  The patient is STABLE and is discharged to home in good condition.    I personally performed the services described in this documentation, which was scribed in my presence. The recorded information has been reviewed and considered. Rhunette Croft, M.D.          Rhunette Croft, MD 06/17/12 [phone]

## 2012-06-16 NOTE — ED Notes (Signed)
Chest pain, onset 10-15 min pta,  Nausea,sob  Headache all day

## 2012-06-17 LAB — POCT I-STAT TROPONIN I: Troponin i, poc: 0 ng/mL (ref 0.00–0.08)

## 2012-06-17 NOTE — Telephone Encounter (Signed)
Spoke with patient, who states that he is feeling better this am, after receiving lasix in the ED last evening.  Denies chest pain or SOB.  States BP is down to 136/78 today.  No appointments available in the [place] office this week, therefore will see Eugene [name] in the [place] office at 9 am tomorrow to address his issues, as he has experienced marked weight gain and fluid retention.  Spoke to him on October 7th, after seeing PCP, who discontinued chlorthalidone and started him on Diovan 360, which he has been taking since, without improvement.

## 2012-06-17 NOTE — ED Notes (Signed)
Discharge instructions reviewed with pt, questions answered. Pt verbalized understanding.  

## 2012-06-17 NOTE — Telephone Encounter (Signed)
?  This appears to be Dr. Izell [place] patient.

## 2012-06-18 ENCOUNTER — Encounter: Payer: Self-pay | Admitting: Physician Assistant

## 2012-06-18 ENCOUNTER — Ambulatory Visit (INDEPENDENT_AMBULATORY_CARE_PROVIDER_SITE_OTHER): Payer: PRIVATE HEALTH INSURANCE | Admitting: Physician Assistant

## 2012-06-18 VITALS — BP 149/86 | HR 85 | Ht 73.0 in | Wt 244.1 lb

## 2012-06-18 DIAGNOSIS — R609 Edema, unspecified: Secondary | ICD-10-CM

## 2012-06-18 DIAGNOSIS — R0609 Other forms of dyspnea: Secondary | ICD-10-CM

## 2012-06-18 DIAGNOSIS — R0602 Shortness of breath: Secondary | ICD-10-CM

## 2012-06-18 DIAGNOSIS — I10 Essential (primary) hypertension: Secondary | ICD-10-CM

## 2012-06-18 MED ORDER — FUROSEMIDE 40 MG PO TABS: 40.0000 mg | ORAL_TABLET | Freq: Every day | ORAL | Status: DC

## 2012-06-18 NOTE — Progress Notes (Addendum)
Primary Cardiologist: Jacqulyn Ducking, MD   HPI: Patient presents as an add-on for evaluation of edema and recent significant weight gain.  Patient presented to [facility] ED on October 21, with complaint of recent significant weight gain with LE edema. He reportedly has gained 10-12 pounds in the last week. He was treated with 60 mg IV Lasix in the ED, and instructed to take 60 mg oral Lasix, the following morning. A CXR indicated vascular congestion, but no overt edema. Patient was also treated with nebulizer therapy.  He reports today no significant change in his LE edema. Clinically, he complains of DOE, but no orthopnea, PND, or CP.  Recent ER labs: potassium 4.1, BUN 18, creatinine 0.7, and troponin I 0.00. Pro BNP elevated at 360. CBC stable.  Also of note, patient had recent medication adjustments, per Dr. [name], with discontinuation of chlorthalidone and addition of Diovan 360 daily, for management of uncontrolled HTN.  Allergies  Allergen Reactions  . Hydrocodone Nausea And Vomiting  . Statins Other (See Comments)    Muscle aches    Current Outpatient Prescriptions  Medication Sig Dispense Refill  . albuterol (PROAIR HFA) 108 (90 BASE) MCG/ACT inhaler Inhale 2 puffs into the lungs every 6 (six) hours as needed. For shortness of breath      . ALPRAZolam (XANAX) 0.5 MG tablet Take 0.5 mg by mouth at bedtime as needed. Sleep aid      . aspirin EC 81 MG tablet Take 81 mg by mouth at bedtime.       . budesonide-formoterol (SYMBICORT) 160-4.5 MCG/ACT inhaler Inhale 2 puffs into the lungs 2 (two) times daily.      . Flaxseed, Linseed, (FLAXSEED OIL) 1000 MG CAPS Take 2,000 mg by mouth daily.       . furosemide (LASIX) 20 MG tablet Take 1 tablet (20 mg total) by mouth daily.  30 tablet  11  . insulin aspart (NOVOLOG) 100 UNIT/ML injection Inject 3-25 Units into the skin 2 (two) times daily. SLIDING SCALE  NOVOLOG INSULIN BEFORE BREAKFAST AND BEFORE DINNER:   FOR A BLOOD SUGAR OF 70-120  GIVE 0 UNITS.   FOR BLOOD SUGAR OF 121-150 GIVE 3 UNITS.   FOR BLOOD SUGAR OF 151-200 GIVE 4 UNITS.   FOR BLOOD SUGAR 201-250 GIVE 7 UNITS.   FOR BLOOD SUGAR 251-300 GIVE 11 UNITS.   FOR BLOOD SUGAR 301-350 GIVE 15 UNITS.   FOR BLOOD SUGAR 351-400 GIVE 20 UNITS.    FOR BLOOD SUGAR OVER 400 GIVE 25 UNITS AND CALL YOUR DOCTOR.      [name] insulin glargine (LANTUS) 100 UNIT/ML injection Inject 65 Units into the skin at bedtime.  3 mL  2  . pantoprazole (PROTONIX) 40 MG tablet Take 1 tablet (40 mg total) by mouth daily.  30 tablet  3  . promethazine (PHENERGAN) 25 MG tablet Take 1 tablet (25 mg total) by mouth every 6 (six) hours as needed for nausea.  12 tablet  0  . valsartan (DIOVAN) 320 MG tablet Take 320 mg by mouth daily.      . verapamil (VERELAN PM) 240 MG 24 hr capsule Take 1 capsule (240 mg total) by mouth at bedtime.  30 capsule  11    Past Medical History  Diagnosis Date  . Arteriosclerotic cardiovascular disease (ASCVD)     Cath in 05/2011: Mild pulmonary hypertension, hyperdynamic LV, 50% LAD, severe first diagonal disease in a small vessel, 80% mid D2 also small, 40% circumflex, 50-60% RCA proximally  and 99% distally-DES x2  . Hyperlipidemia   . Diabetes mellitus     Peripheral neuropathy  . Bell palsy   . Abnormal cardiac cath   . Hypertension   . COPD (chronic obstructive pulmonary disease)   . Sleep apnea   . Gallstones   . GERD (gastroesophageal reflux disease) 05/10/2012  . Nephrolithiasis     Past Surgical History  Procedure Date  . Circumcision   . Stents   . Esophagogastroduodenoscopy     in [place] over 20 yrs ago  . Colonoscopy     In [facility], approximately 2011 per patient, was normal. Advised to come back in 10 years.    History   Social History  . Marital Status: Married    Spouse Name: N/A    Number of Children: 2  . Years of Education: N/A   Occupational History  . Shipping     [employer]   Social History Main Topics  .  Smoking status: Never Smoker   . Smokeless tobacco: Not on file  . Alcohol Use: No     heavy etoh use 30 years ago  . Drug Use: No  . Sexually Active: Yes    Birth Control/ Protection: None   Other Topics Concern  . 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 Disabled at this point.    Family History  Problem Relation Age of Onset  . Diabetes Mother   . Heart attack Mother   . Stroke Mother   . Diabetes Sister   . Sleep apnea Sister   . Hypertension Brother   . Diabetes Brother   . Colon cancer Neg Hx   . Liver disease Neg Hx     ROS: no nausea, vomiting; no fever, chills; no melena, hematochezia; no claudication  PHYSICAL EXAM: BP 149/86  Pulse 85  Ht 6\' 1"  (1.854 m)  Wt 244 lb 1.9 oz (110.732 kg)  BMI 32.21 kg/m2 GENERAL: 56 year old male; NAD HEENT: NCAT, PERRLA, EOMI; sclera clear; no xanthelasma NECK: palpable bilateral carotid pulses, no bruits; no JVD; no TM LUNGS: CTA bilaterally CARDIAC: RRR (S1, S2); no significant murmurs; no rubs or gallops ABDOMEN: soft, non-tender; intact BS EXTREMETIES: intact distal pulses; no significant peripheral edema SKIN: warm/dry; no obvious rash/lesions MUSCULOSKELETAL: no joint deformity NEURO: no focal deficit; NL affect   EKG: reviewed and available in Electronic Records   ASSESSMENT & PLAN:  Edema Will increase Lasix to 40 mg daily. Check followup BMET and BNP level in one week. Followup with Dr. Lattie Haw in the next 2 weeks, as previously scheduled. Patient also instructed to refrain from added salt, and to weigh himself daily.  Dyspnea on exertion We'll order a 2-D echocardiogram for reassessment of LVF, in light of recent development of refractory LE edema and DOE. Patient has history of normal LVF.  Hypertension Status post recent adjustment of antihypertensive regimen, with initiation of Diovan 360 daily and discontinuation of chlorthalidone. Recommended  follow up with primary M.D. for continued monitoring and management.    Gene Sharayah Renfrow, PAC

## 2012-06-18 NOTE — Assessment & Plan Note (Addendum)
Will increase Lasix to 40 mg daily. Check followup BMET and BNP level in one week. Followup with Dr. Lattie Haw in the next 2 weeks, as previously scheduled. Patient also instructed to refrain from added salt, and to weigh himself daily.

## 2012-06-18 NOTE — Assessment & Plan Note (Signed)
Status post recent adjustment of antihypertensive regimen, with initiation of Diovan 360 daily and discontinuation of chlorthalidone. Recommended follow up with primary M.D. for continued monitoring and management.

## 2012-06-18 NOTE — Patient Instructions (Signed)
   Increase Lasix to 40mg  daily   Labs:  Do in 7-10 days (around 10/30)   Echo  Office will contact with results  No added salt   Weigh daily  Follow up with Dr. Lattie Haw as scheduled

## 2012-06-18 NOTE — Assessment & Plan Note (Signed)
We'll order a 2-D echocardiogram for reassessment of LVF, in light of recent development of refractory LE edema and DOE. Patient has history of normal LVF.

## 2012-06-19 ENCOUNTER — Other Ambulatory Visit (INDEPENDENT_AMBULATORY_CARE_PROVIDER_SITE_OTHER): Payer: PRIVATE HEALTH INSURANCE

## 2012-06-19 ENCOUNTER — Other Ambulatory Visit: Payer: Self-pay

## 2012-06-19 DIAGNOSIS — I509 Heart failure, unspecified: Secondary | ICD-10-CM

## 2012-06-19 DIAGNOSIS — R0609 Other forms of dyspnea: Secondary | ICD-10-CM

## 2012-06-19 DIAGNOSIS — R0989 Other specified symptoms and signs involving the circulatory and respiratory systems: Secondary | ICD-10-CM

## 2012-06-19 DIAGNOSIS — R609 Edema, unspecified: Secondary | ICD-10-CM

## 2012-06-19 DIAGNOSIS — R0602 Shortness of breath: Secondary | ICD-10-CM

## 2012-06-23 ENCOUNTER — Ambulatory Visit: Payer: PRIVATE HEALTH INSURANCE | Admitting: Cardiology

## 2012-06-24 ENCOUNTER — Telehealth: Payer: Self-pay | Admitting: *Deleted

## 2012-06-24 NOTE — Telephone Encounter (Signed)
Message copied by Merlene Laughter on Tue Jun 24, 2012 11:25 AM ------      Message from: Donney Dice      Created: Fri Jun 20, 2012  8:11 AM       NL left and right ventricular fxn. No further workup. F/u in [place], as outlined.

## 2012-06-24 NOTE — Telephone Encounter (Signed)
Patient informed. 

## 2012-07-02 ENCOUNTER — Ambulatory Visit: Payer: PRIVATE HEALTH INSURANCE | Admitting: Cardiology

## 2012-07-22 ENCOUNTER — Telehealth: Payer: Self-pay | Admitting: Cardiology

## 2012-07-22 DIAGNOSIS — R0602 Shortness of breath: Secondary | ICD-10-CM

## 2012-07-22 DIAGNOSIS — I10 Essential (primary) hypertension: Secondary | ICD-10-CM

## 2012-07-22 NOTE — Telephone Encounter (Signed)
Patient states that he has had swelling off and on in his ankles.  States it goes down at night.  Denies weight gain or SOB.  Recently had an echo, reviewed by Dr Ron Parker on 11/24.  Advised him to weigh daily and report a weight gain of > 5 lb, in addition to elevating feet as much as possible during the day.  Has a follow up visit with you, late December.

## 2012-07-22 NOTE — Telephone Encounter (Signed)
Patient would like to speak to nurse regarding swelling in feet and legs at night. / tgs

## 2012-07-23 ENCOUNTER — Encounter: Payer: Self-pay | Admitting: Cardiology

## 2012-07-23 NOTE — Telephone Encounter (Signed)
Advised patient of recommendations and will see him Tuesday November 2nd.  Verbalizes understanding.

## 2012-07-23 NOTE — Telephone Encounter (Signed)
RN visit Low-salt diet Verify current medications CMet, CBC, BNP level

## 2012-07-23 NOTE — Telephone Encounter (Signed)
Message left for a return call

## 2012-07-27 DIAGNOSIS — K8020 Calculus of gallbladder without cholecystitis without obstruction: Secondary | ICD-10-CM

## 2012-07-27 HISTORY — DX: Calculus of gallbladder without cholecystitis without obstruction: K80.20

## 2012-07-29 ENCOUNTER — Ambulatory Visit (INDEPENDENT_AMBULATORY_CARE_PROVIDER_SITE_OTHER): Payer: PRIVATE HEALTH INSURANCE | Admitting: *Deleted

## 2012-07-29 VITALS — BP 140/80 | HR 66 | Ht 73.0 in | Wt 256.0 lb

## 2012-07-29 DIAGNOSIS — I10 Essential (primary) hypertension: Secondary | ICD-10-CM

## 2012-07-29 LAB — COMPREHENSIVE METABOLIC PANEL
ALT: 17 U/L (ref 0–53)
AST: 17 U/L (ref 0–37)
Albumin: 3.4 g/dL — ABNORMAL LOW (ref 3.5–5.2)
Alkaline Phosphatase: 60 U/L (ref 39–117)
Calcium: 9.1 mg/dL (ref 8.4–10.5)
Chloride: 104 mEq/L (ref 96–112)
Creat: 0.87 mg/dL (ref 0.50–1.35)
Potassium: 5.2 mEq/L (ref 3.5–5.3)

## 2012-07-29 LAB — CBC
HCT: 32.7 % — ABNORMAL LOW (ref 39.0–52.0)
MCH: 29.9 pg (ref 26.0–34.0)
MCHC: 34.9 g/dL (ref 30.0–36.0)
RDW: 14.1 % (ref 11.5–15.5)

## 2012-07-29 NOTE — Progress Notes (Signed)
Patient presents for a blood pressure check, as a result of multiple calls regarding increased edema.  CBC,CMET and BNP to be done today, per orders.  Low sodium diet information provided.  Weight 10/23 BP 149/89, 256 lb today.  Denies SOB, just bilat LE edema during the day, which resolves at night.  No acute distress noted.

## 2012-07-31 ENCOUNTER — Encounter: Payer: Self-pay | Admitting: Cardiology

## 2012-07-31 ENCOUNTER — Encounter: Payer: Self-pay | Admitting: *Deleted

## 2012-07-31 DIAGNOSIS — D649 Anemia, unspecified: Secondary | ICD-10-CM | POA: Insufficient documentation

## 2012-08-01 ENCOUNTER — Telehealth: Payer: Self-pay

## 2012-08-01 DIAGNOSIS — I10 Essential (primary) hypertension: Secondary | ICD-10-CM

## 2012-08-01 MED ORDER — FUROSEMIDE 40 MG PO TABS: ORAL_TABLET | ORAL | Status: DC

## 2012-08-01 NOTE — Telephone Encounter (Signed)
Patient called was told received message from [place] he advised to increase lasix to 40 mg twice a day.Repeat bmet in 10 days.

## 2012-08-01 NOTE — Patient Instructions (Signed)
See phone note 08/01/12.

## 2012-08-01 NOTE — Progress Notes (Signed)
Patient ID: Eugene [name], male   DOB: [birth year], 56 y.o.   MRN: [id number]  Increase Lasix to 40 mg twice per day. BMet in 10 days Daily home weights.

## 2012-08-01 NOTE — Addendum Note (Signed)
Addended by: Golden Hurter D on: 08/01/2012 04:52 PM   Modules accepted: Orders

## 2012-08-04 NOTE — Progress Notes (Signed)
Spoke with patient and he states he was made aware of recommendations below and started on Saturday.

## 2012-08-06 ENCOUNTER — Telehealth: Payer: Self-pay | Admitting: Internal Medicine

## 2012-08-06 NOTE — Telephone Encounter (Signed)
Pt called this afternoon wanting to speak with LSL. I told him that she had several more patients to see and could not come to the phone. He said that he has been having problems breathing after since his procedure and wanted to see would see would advise. I offered to make him an OV but he wants to speak with LSL and make OV with her. I told him that she is getting ready to leave on maternity leave and I could get him in with KJ or AS, but he insisted on speaking with LSL. Please call him back at [phone]

## 2012-08-06 NOTE — Telephone Encounter (Signed)
Spoke with pt- this breathing difficulty is nothing new, the pt has had this for a long time. He does not have a fever or dizziness or anything new. When he saw LSL in August and was put on protonix, he felt much better and could breathe great and didn't have any coughing. Since his egd/ed in October he has had the difficulty breathing and coughing again. Pt wants to know if he should take protonix bid or be put on a stronger ppi? Please advise.

## 2012-08-07 MED ORDER — PANTOPRAZOLE SODIUM 40 MG PO TBEC: 40.0000 mg | DELAYED_RELEASE_TABLET | Freq: Two times a day (BID) | ORAL | Status: DC

## 2012-08-07 NOTE — Addendum Note (Signed)
Addended by: Mahala Menghini on: 08/07/2012 03:12 PM   Modules accepted: Orders

## 2012-08-07 NOTE — Telephone Encounter (Signed)
Pt aware and verbalized understanding.  

## 2012-08-07 NOTE — Telephone Encounter (Signed)
I last saw the patient in 03/2012. Seen by Dr. Gala Romney in 04/2012. Cough could be from GERD, less likely a cause of SOB/DOE. He has h/o edema, heart issues. Recommend he continue f/u with cardiology and PCP.   We can increase his protonix to BID for two months but if significant SOB or change in these symptoms he should see his PCP or go to ED.  RX sent to pharmacy.

## 2012-08-12 LAB — BASIC METABOLIC PANEL
Chloride: 97 mEq/L (ref 96–112)
Potassium: 5.1 mEq/L (ref 3.5–5.3)
Sodium: 136 mEq/L (ref 135–145)

## 2012-08-13 ENCOUNTER — Encounter: Payer: Self-pay | Admitting: Cardiology

## 2012-08-13 ENCOUNTER — Encounter: Payer: Self-pay | Admitting: *Deleted

## 2012-08-18 ENCOUNTER — Telehealth: Payer: Self-pay | Admitting: Cardiology

## 2012-08-18 ENCOUNTER — Encounter: Payer: Self-pay | Admitting: Cardiology

## 2012-08-18 ENCOUNTER — Ambulatory Visit (INDEPENDENT_AMBULATORY_CARE_PROVIDER_SITE_OTHER): Payer: PRIVATE HEALTH INSURANCE | Admitting: Cardiology

## 2012-08-18 VITALS — BP 140/86 | HR 74 | Ht 73.0 in | Wt 254.0 lb

## 2012-08-18 DIAGNOSIS — I7090 Unspecified atherosclerosis: Secondary | ICD-10-CM

## 2012-08-18 DIAGNOSIS — R609 Edema, unspecified: Secondary | ICD-10-CM

## 2012-08-18 DIAGNOSIS — R5383 Other fatigue: Secondary | ICD-10-CM

## 2012-08-18 DIAGNOSIS — R600 Localized edema: Secondary | ICD-10-CM

## 2012-08-18 DIAGNOSIS — I10 Essential (primary) hypertension: Secondary | ICD-10-CM

## 2012-08-18 DIAGNOSIS — E785 Hyperlipidemia, unspecified: Secondary | ICD-10-CM

## 2012-08-18 DIAGNOSIS — I2510 Atherosclerotic heart disease of native coronary artery without angina pectoris: Secondary | ICD-10-CM

## 2012-08-18 DIAGNOSIS — D649 Anemia, unspecified: Secondary | ICD-10-CM

## 2012-08-18 DIAGNOSIS — R5381 Other malaise: Secondary | ICD-10-CM

## 2012-08-18 LAB — CBC
HCT: 35 % — ABNORMAL LOW (ref 39.0–52.0)
MCV: 87.9 fL (ref 78.0–100.0)
Platelets: 180 10*3/uL (ref 150–400)
RBC: 3.98 MIL/uL — ABNORMAL LOW (ref 4.22–5.81)
RDW: 14 % (ref 11.5–15.5)
WBC: 5.5 10*3/uL (ref 4.0–10.5)

## 2012-08-18 MED ORDER — DOXAZOSIN MESYLATE 4 MG PO TABS: 4.0000 mg | ORAL_TABLET | Freq: Every day | ORAL | Status: DC

## 2012-08-18 NOTE — Assessment & Plan Note (Signed)
Blood pressure control has been suboptimal, but is now improved.  Verapamil may be contributing to peripheral edema and will be held for now.  Cardura dosage will be increased as necessary to prevent a loss of control of hypertension.

## 2012-08-18 NOTE — Assessment & Plan Note (Signed)
Options for treatment are limited, as the patient has suffered adverse reactions to multiple lipid-lowering medications.  It is unclear why he was advised that fenofibrate is contraindicated.  We have asked him to bring the letter that he received for review.

## 2012-08-18 NOTE — Progress Notes (Signed)
Patient ID: Eugene [name], male   DOB: Dec 23, 1955, 56 y.o.   MRN: [id number]  HPI: Scheduled return visit for this gentleman with multiple medical problems, coronary artery disease and the recent development of substantial peripheral edema.  This is been associated with a weight gain of perhaps 10-15 pounds.  There's been no other evidence for congestive heart failure, and BNP level was recently only slightly elevated.  He was evaluated in the [place] office, where her diuretic therapy was adjusted, but edema has persisted.  Echocardiogram revealed normal EF and no significant valvular abnormalities.  Prior to Admission medications   Medication Sig Start Date End Date Taking? Authorizing Provider  albuterol (PROAIR HFA) 108 (90 BASE) MCG/ACT inhaler Inhale 2 puffs into the lungs every 6 (six) hours as needed. For shortness of breath   Yes Historical Provider, MD  ALPRAZolam Duanne Moron) 0.5 MG tablet Take 0.5 mg by mouth at bedtime as needed. Sleep aid   Yes Historical Provider, MD  aspirin EC 81 MG tablet Take 81 mg by mouth at bedtime.    Yes Historical Provider, MD  budesonide-formoterol (SYMBICORT) 160-4.5 MCG/ACT inhaler Inhale 2 puffs into the lungs 2 (two) times daily.   Yes Historical Provider, MD  doxazosin (CARDURA) 4 MG tablet Take 1 tablet (4 mg total) by mouth at bedtime. 08/18/12  Yes Yehuda Savannah, MD  Flaxseed, Linseed, (FLAXSEED OIL) 1000 MG CAPS Take 2,000 mg by mouth daily.    Yes Historical Provider, MD  furosemide (LASIX) 40 MG tablet Take 40 mg twice a day 08/01/12  Yes Yehuda Savannah, MD  insulin aspart (NOVOLOG) 100 UNIT/ML injection Inject 3-25 Units into the skin 2 (two) times daily. SLIDING SCALE  NOVOLOG INSULIN BEFORE BREAKFAST AND BEFORE DINNER:   FOR A BLOOD SUGAR OF 70-120 GIVE 0 UNITS.   FOR BLOOD SUGAR OF 121-150 GIVE 3 UNITS.   FOR BLOOD SUGAR OF 151-200 GIVE 4 UNITS.   FOR BLOOD SUGAR 201-250 GIVE 7 UNITS.   FOR BLOOD SUGAR 251-300 GIVE 11 UNITS.   FOR BLOOD SUGAR  301-350 GIVE 15 UNITS.   FOR BLOOD SUGAR 351-400 GIVE 20 UNITS.    FOR BLOOD SUGAR OVER 400 GIVE 25 UNITS AND CALL YOUR DOCTOR. 05/11/12  Yes Rexene Alberts, MD  insulin glargine (LANTUS) 100 UNIT/ML injection Inject 75 Units into the skin at bedtime. 05/11/12 05/11/13 Yes Rexene Alberts, MD  pantoprazole (PROTONIX) 40 MG tablet Take 1 tablet (40 mg total) by mouth 2 (two) times daily before a meal. 08/07/12 08/07/13 Yes Mahala Menghini, PA  promethazine (PHENERGAN) 25 MG tablet Take 1 tablet (25 mg total) by mouth every 6 (six) hours as needed for nausea. 06/01/12  Yes [name], MD  valsartan (DIOVAN) 320 MG tablet Take 320 mg by mouth daily. 06/03/12  Yes [name], MD   Allergies  Allergen Reactions  . Hydrocodone Nausea And Vomiting  . Statins Other (See Comments)    Muscle aches  Past medical history, social history, and family history reviewed and updated.  ROS: Denies orthopnea, PND, exertional dyspnea, urinary frequency.  All other systems reviewed and are negative.  PHYSICAL EXAM: BP 140/86  Pulse 74  Ht 6\' 1"  (1.854 m)  Wt 115.214 kg (254 lb)  BMI 33.51 kg/m2  General-Well developed; no acute distress Body habitus-Overweightt Neck-No JVD; no carotid bruits Lungs-clear lung fields; resonant to percussion Cardiovascular-normal PMI; normal S1 and S2; modest systolic ejection murmur Abdomen-normal bowel sounds; soft and non-tender without masses or  organomegaly Musculoskeletal-No deformities, no cyanosis or clubbing Neurologic-Normal cranial nerves; symmetric strength and tone Skin-Warm, no significant lesions Extremities-distal pulses intact; 2+ edema to the knees  EKG: Tracing of 06/16/12 obtained and reviewed  .  Normal sinus rhythm, rightward axis, right ventricular conduction delay, delayed R-wave progression present.  ASSESSMENT AND PLAN:  Jacqulyn Ducking, MD 08/18/2012 4:56 PM

## 2012-08-18 NOTE — Telephone Encounter (Signed)
PT FORGOT TO TELL DR ROTHBART EVERY THING HE EATS TASTE LIKE SALT

## 2012-08-18 NOTE — Patient Instructions (Addendum)
Your physician recommends that you schedule a follow-up appointment in: 3 weeks  Your physician has requested that you have a lower or upper extremity venous duplex. This test is an ultrasound of the veins in the legs or arms. It looks at venous blood flow that carries blood from the heart to the legs or arms. Allow one hour for a Lower Venous exam. Allow thirty minutes for an Upper Venous exam. There are no restrictions or special instructions.  Abdominal Ultrasound  Your physician recommends that you return for lab work in: Today to include a urinalysis  Stools x 3 and return them to the office to be tested  Your physician has requested that you regularly monitor and record your blood pressure readings at home. Please use the same machine at the same time of day to check your readings and record them to bring to your follow-up visit.  Your physician has recommended you make the following change in your medication:  1 - STOP Verapamil 2 - INCREASE Cardura to 4 mg daily

## 2012-08-18 NOTE — Telephone Encounter (Signed)
noted 

## 2012-08-18 NOTE — Assessment & Plan Note (Signed)
No symptoms at present to suggest myocardial ischemia.  We will continue to focus on optimal control of cardiovascular risk factors.

## 2012-08-18 NOTE — Assessment & Plan Note (Signed)
No prior laboratory evaluation apparently performed.  Iron studies and stool for Hemoccult testing will be obtained.

## 2012-08-18 NOTE — Assessment & Plan Note (Signed)
No etiology has been identified to account for his marked increase in peripheral edema.  He is on no particularly offensive drugs, but verapamil could be contributing and will be discontinued for now.  Further evaluation will include urinalysis, venous ultrasound of lower extremities and abdominal ultrasound.  He will monitor blood pressure at home.

## 2012-08-18 NOTE — Progress Notes (Deleted)
Name: Eugene [name]    DOB: 1956-04-09  Age: 56 y.o.  MR#: [id number]       PCP:  [name], MD      Insurance: @PAYORNAME @   CC:   No chief complaint on file.  MEDICATION LIST LASIX WAS INCREASED TO 40 MG BID, DUE TO INCREASED SWELLING DURING THE DAY ECHO 06/19/12 CXR 06/16/12  VS BP 140/86  Pulse 74  Ht 6\' 1"  (1.854 m)  Wt 254 lb (115.214 kg)  BMI 33.51 kg/m2 - HAS BEEN OUT OF DIOVAN X 2 DAYS (TO FILL TODAY)  Weights Current Weight  08/18/12 254 lb (115.214 kg)  07/29/12 256 lb (116.121 kg)  06/18/12 244 lb 1.9 oz (110.732 kg)    Blood Pressure  BP Readings from Last 3 Encounters:  08/18/12 140/86  07/29/12 140/80  06/18/12 149/86     Admit date:  (Not on file) Last encounter with RMR:  08/13/2012   Allergy Allergies  Allergen Reactions  . Hydrocodone Nausea And Vomiting  . Statins Other (See Comments)    Muscle aches    Current Outpatient Prescriptions  Medication Sig Dispense Refill  . albuterol (PROAIR HFA) 108 (90 BASE) MCG/ACT inhaler Inhale 2 puffs into the lungs every 6 (six) hours as needed. For shortness of breath      . ALPRAZolam (XANAX) 0.5 MG tablet Take 0.5 mg by mouth at bedtime as needed. Sleep aid      . aspirin EC 81 MG tablet Take 81 mg by mouth at bedtime.       . budesonide-formoterol (SYMBICORT) 160-4.5 MCG/ACT inhaler Inhale 2 puffs into the lungs 2 (two) times daily.      [name] doxazosin (CARDURA) 2 MG tablet Take 2 mg by mouth at bedtime.      . Flaxseed, Linseed, (FLAXSEED OIL) 1000 MG CAPS Take 2,000 mg by mouth daily.       . furosemide (LASIX) 40 MG tablet Take 40 mg twice a day  60 tablet  6  . insulin aspart (NOVOLOG) 100 UNIT/ML injection Inject 3-25 Units into the skin 2 (two) times daily. SLIDING SCALE  NOVOLOG INSULIN BEFORE BREAKFAST AND BEFORE DINNER:   FOR A BLOOD SUGAR OF 70-120 GIVE 0 UNITS.   FOR BLOOD SUGAR OF 121-150 GIVE 3 UNITS.   FOR BLOOD SUGAR OF 151-200 GIVE 4 UNITS.   FOR BLOOD SUGAR 201-250 GIVE 7 UNITS.   FOR  BLOOD SUGAR 251-300 GIVE 11 UNITS.   FOR BLOOD SUGAR 301-350 GIVE 15 UNITS.   FOR BLOOD SUGAR 351-400 GIVE 20 UNITS.    FOR BLOOD SUGAR OVER 400 GIVE 25 UNITS AND CALL YOUR DOCTOR.      [name] insulin glargine (LANTUS) 100 UNIT/ML injection Inject 75 Units into the skin at bedtime.      . pantoprazole (PROTONIX) 40 MG tablet Take 1 tablet (40 mg total) by mouth 2 (two) times daily before a meal.  60 tablet  2  . promethazine (PHENERGAN) 25 MG tablet Take 1 tablet (25 mg total) by mouth every 6 (six) hours as needed for nausea.  12 tablet  0  . valsartan (DIOVAN) 320 MG tablet Take 320 mg by mouth daily.      . verapamil (VERELAN PM) 240 MG 24 hr capsule Take 1 capsule (240 mg total) by mouth at bedtime.  30 capsule  11    Discontinued Meds:   There are no discontinued medications.  Patient Active Problem List  Diagnosis  . DM type 2 (diabetes  mellitus, type 2)  . Hypertension  . Cholelithiasis  . Arteriosclerotic cardiovascular disease (ASCVD)  . Hyperlipidemia  . Obstructive sleep apnea  . Hyponatremia  . Peripheral neuropathy  . Noncompliance  . Gastroesophageal reflux disease  . Anemia, normocytic normochromic    LABS Telephone on 08/01/2012  Component Date Value  . Sodium 08/11/2012 136   . Potassium 08/11/2012 5.1   . Chloride 08/11/2012 97   . CO2 08/11/2012 28   . Glucose, Bld 08/11/2012 382*  . BUN 08/11/2012 33*  . Creat 08/11/2012 0.95   . Calcium 08/11/2012 9.3   Telephone on 07/22/2012  Component Date Value  . Sodium 07/29/2012 138   . Potassium 07/29/2012 5.2   . Chloride 07/29/2012 104   . CO2 07/29/2012 26   . Glucose, Bld 07/29/2012 218*  . BUN 07/29/2012 35*  . Creat 07/29/2012 0.87   . Total Bilirubin 07/29/2012 0.5   . Alkaline Phosphatase 07/29/2012 60   . AST 07/29/2012 17   . ALT 07/29/2012 17   . Total Protein 07/29/2012 6.0   . Albumin 07/29/2012 3.4*  . Calcium 07/29/2012 9.1   . WBC 07/29/2012 4.5   . RBC 07/29/2012 3.81*  . Hemoglobin  07/29/2012 11.4*  . HCT 07/29/2012 32.7*  . MCV 07/29/2012 85.8   . [facility] 07/29/2012 29.9   . MCHC 07/29/2012 34.9   . RDW 07/29/2012 14.1   . Platelets 07/29/2012 166   . Brain Natriuretic Peptide 07/29/2012 12.4   Admission on 06/16/2012, Discharged on 06/17/2012  Component Date Value  . WBC 06/16/2012 6.6   . RBC 06/16/2012 4.00*  . Hemoglobin 06/16/2012 12.1*  . HCT 06/16/2012 33.9*  . MCV 06/16/2012 84.8   . [facility] 06/16/2012 30.3   . MCHC 06/16/2012 35.7   . RDW 06/16/2012 12.6   . Platelets 06/16/2012 205   . Pro B Natriuretic peptid* 06/16/2012 360.1*  . Sodium 06/16/2012 134*  . Potassium 06/16/2012 4.1   . Chloride 06/16/2012 98   . CO2 06/16/2012 28   . Glucose, Bld 06/16/2012 248*  . BUN 06/16/2012 18   . Creatinine, Ser 06/16/2012 0.71   . Calcium 06/16/2012 9.8   . Total Protein 06/16/2012 6.7   . Albumin 06/16/2012 2.9*  . AST 06/16/2012 13   . ALT 06/16/2012 16   . Alkaline Phosphatase 06/16/2012 96   . Total Bilirubin 06/16/2012 0.4   . GFR calc non Af Amer 06/16/2012 >90   . GFR calc Af Amer 06/16/2012 >90   . Magnesium 06/16/2012 1.6   . Troponin i, poc 06/17/2012 0.00   . Comment 3 06/17/2012          Admission on 06/12/2012, Discharged on 06/12/2012  Component Date Value  . Glucose-Capillary 06/12/2012 249*  Admission on 05/31/2012, Discharged on 06/01/2012  Component Date Value  . WBC 05/31/2012 6.7   . RBC 05/31/2012 4.39   . Hemoglobin 05/31/2012 13.4   . HCT 05/31/2012 37.3*  . MCV 05/31/2012 85.0   . [facility] 05/31/2012 30.5   . MCHC 05/31/2012 35.9   . RDW 05/31/2012 13.2   . Platelets 05/31/2012 193   . Neutrophils Relative 05/31/2012 55   . Neutro Abs 05/31/2012 3.7   . Lymphocytes Relative 05/31/2012 36   . Lymphs Abs 05/31/2012 2.4   . Monocytes Relative 05/31/2012 6   . Monocytes Absolute 05/31/2012 0.4   . Eosinophils Relative 05/31/2012 2   . Eosinophils Absolute 05/31/2012 0.2   . Basophils Relative 05/31/2012 0   .  Basophils  Absolute 05/31/2012 0.0   . Sodium 05/31/2012 129*  . Potassium 05/31/2012 4.1   . Chloride 05/31/2012 93*  . CO2 05/31/2012 24   . Glucose, Bld 05/31/2012 392*  . BUN 05/31/2012 25*  . Creatinine, Ser 05/31/2012 1.15   . Calcium 05/31/2012 9.7   . Total Protein 05/31/2012 6.7   . Albumin 05/31/2012 3.4*  . AST 05/31/2012 17   . ALT 05/31/2012 19   . Alkaline Phosphatase 05/31/2012 111   . Total Bilirubin 05/31/2012 0.4   . GFR calc non Af Amer 05/31/2012 70*  . GFR calc Af Amer 05/31/2012 81*  . Lipase 05/31/2012 17   . aPTT 05/31/2012 28   . Prothrombin Time 05/31/2012 12.1   . INR 05/31/2012 0.90   . Troponin I 05/31/2012 <0.30   . Glucose-Capillary 06/01/2012 332*  . Glucose-Capillary 06/01/2012 288*  . Glucose-Capillary 06/01/2012 232*     Results for this Opt Visit:     Results for orders placed in visit on 99991111  BASIC METABOLIC PANEL      Component Value Range   Sodium 136  135 - 145 mEq/L   Potassium 5.1  3.5 - 5.3 mEq/L   Chloride 97  96 - 112 mEq/L   CO2 28  19 - 32 mEq/L   Glucose, Bld 382 (*) 70 - 99 mg/dL   BUN 33 (*) 6 - 23 mg/dL   Creat 0.95  0.50 - 1.35 mg/dL   Calcium 9.3  8.4 - 10.5 mg/dL    EKG Orders placed during the hospital encounter of 06/16/12  . ED EKG  . ED EKG  . EKG     Prior Assessment and Plan Problem List as of 08/18/2012            Cardiology Problems   Hypertension   Last Assessment & Plan Note   06/18/2012 Office Visit Signed 06/18/2012 10:00 AM by Aurora Mask, PA    Status post recent adjustment of antihypertensive regimen, with initiation of Diovan 360 daily and discontinuation of chlorthalidone. Recommended follow up with primary M.D. for continued monitoring and management.    Arteriosclerotic cardiovascular disease (ASCVD)   Last Assessment & Plan Note   10/24/2011 Office Visit Signed 10/24/2011  2:15 PM by Yehuda Savannah, MD    Patient has moderate 2-vessel disease following recent percutaneous intervention,  but residual stenoses are not favorable for further revascularization.  Recent stress nuclear study revealed poor exercise capacity but no myocardial ischemia.  We will continue to optimally manage cardiovascular risk factors as feasible.    Hyperlipidemia   Last Assessment & Plan Note   10/24/2011 Office Visit Signed 10/24/2011  2:18 PM by Yehuda Savannah, MD    Patient did not tolerate even minimal doses of pravastatin.  He developed aching discomfort in both forearms that was immediately relieved when he discontinued the medication.  It apparently will not be feasible to treat him with a statin.  Use of other agents is fairly speculative in terms of potential benefit.  A lipid profile will be reassessed and a decision made as to whether pharmacologic therapy will be necessary.      Other   DM type 2 (diabetes mellitus, type 2)   Last Assessment & Plan Note   08/23/2011 Office Visit Signed 08/23/2011  2:52 PM by Yehuda Savannah, MD    Patient is unaware of any recent hemoglobin A1c measurements.  We will seek recent laboratory results from his primary care physician.  Cholelithiasis   Obstructive sleep apnea   Last Assessment & Plan Note   08/23/2011 Office Visit Signed 08/23/2011  2:58 PM by Yehuda Savannah, MD    Patient has improved with nocturnal CPAP therapy and supplemental oxygen.  Based upon ABG in 05/2011 with pH of 7.38, pCO2 of 44 and pO2 of 56, he also has an element of hypoventilation at rest and substantial hypoxemia.  The contribution of his weight is uncertain, since his BMI is only approximately 30.  Dr. Luan Pulling is assessing for additional causes of hypoxemia.  He might benefit from continuous oxygen therapy.     Hyponatremia   Last Assessment & Plan Note   10/24/2011 Office Visit Signed 10/28/2011  8:37 AM by Yehuda Savannah, MD    Sodium of 128 when measured a few days ago.  This is partially due to a concomitant blood sugar of 368.  Continued monitoring will be  necessary.     Peripheral neuropathy   Last Assessment & Plan Note   10/24/2011 Office Visit Signed 10/24/2011  2:20 PM by Yehuda Savannah, MD    Risk of repetitive trauma or pressure in the lower extremities discussed with patient and suggestions provided as to how to avoid this as well as the need for him to monitor the condition of his skin on a daily basis.    Noncompliance   Gastroesophageal reflux disease   Anemia, normocytic normochromic       Imaging: No results found.   FRS Calculation: Score not calculated. Missing: Total Cholesterol

## 2012-08-19 ENCOUNTER — Encounter: Payer: Self-pay | Admitting: Cardiology

## 2012-08-19 LAB — URINALYSIS, ROUTINE W REFLEX MICROSCOPIC
Bilirubin Urine: NEGATIVE
Glucose, UA: >1000 mg/dL — AB
Specific Gravity, Urine: 1.025 (ref 1.005–1.030)
Urobilinogen, UA: 0.2 mg/dL (ref 0.0–1.0)

## 2012-08-19 LAB — URINALYSIS, MICROSCOPIC ONLY
Casts: NONE SEEN
Crystals: NONE SEEN

## 2012-08-19 LAB — TSH: TSH: 1.753 u[IU]/mL (ref 0.350–4.500)

## 2012-08-19 LAB — COMPREHENSIVE METABOLIC PANEL
ALT: 14 U/L (ref 0–53)
CO2: 27 mEq/L (ref 19–32)
Calcium: 9.5 mg/dL (ref 8.4–10.5)
Chloride: 99 mEq/L (ref 96–112)
Creat: 1.05 mg/dL (ref 0.50–1.35)
Glucose, Bld: 343 mg/dL — ABNORMAL HIGH (ref 70–99)
Total Bilirubin: 0.4 mg/dL (ref 0.3–1.2)

## 2012-08-19 LAB — IRON AND TIBC
%SAT: 22 % (ref 20–55)
Iron: 62 ug/dL (ref 42–165)
TIBC: 277 ug/dL (ref 215–435)

## 2012-08-22 ENCOUNTER — Ambulatory Visit (HOSPITAL_COMMUNITY)
Admission: RE | Admit: 2012-08-22 | Discharge: 2012-08-22 | Disposition: A | Discharge status: Home / Self Care | Payer: PRIVATE HEALTH INSURANCE | Source: Ambulatory Visit | Attending: Cardiology | Admitting: Cardiology

## 2012-08-22 DIAGNOSIS — I2510 Atherosclerotic heart disease of native coronary artery without angina pectoris: Secondary | ICD-10-CM

## 2012-08-22 DIAGNOSIS — E785 Hyperlipidemia, unspecified: Secondary | ICD-10-CM

## 2012-08-22 DIAGNOSIS — K7689 Other specified diseases of liver: Secondary | ICD-10-CM | POA: Insufficient documentation

## 2012-08-22 DIAGNOSIS — D649 Anemia, unspecified: Secondary | ICD-10-CM

## 2012-08-22 DIAGNOSIS — I10 Essential (primary) hypertension: Secondary | ICD-10-CM

## 2012-08-22 DIAGNOSIS — R600 Localized edema: Secondary | ICD-10-CM

## 2012-08-22 DIAGNOSIS — R5383 Other fatigue: Secondary | ICD-10-CM | POA: Insufficient documentation

## 2012-08-22 DIAGNOSIS — R5381 Other malaise: Secondary | ICD-10-CM | POA: Insufficient documentation

## 2012-08-22 DIAGNOSIS — K8020 Calculus of gallbladder without cholecystitis without obstruction: Secondary | ICD-10-CM | POA: Insufficient documentation

## 2012-08-22 DIAGNOSIS — I7090 Unspecified atherosclerosis: Secondary | ICD-10-CM | POA: Insufficient documentation

## 2012-08-22 DIAGNOSIS — R609 Edema, unspecified: Secondary | ICD-10-CM

## 2012-08-23 ENCOUNTER — Encounter: Payer: Self-pay | Admitting: Cardiology

## 2012-09-08 ENCOUNTER — Encounter (HOSPITAL_COMMUNITY)
Admission: EM | Disposition: A | Discharge status: Home / Self Care | Payer: Self-pay | Source: Home / Self Care | Attending: Cardiovascular Disease

## 2012-09-08 ENCOUNTER — Inpatient Hospital Stay (HOSPITAL_COMMUNITY)
Admission: EM | Admit: 2012-09-08 | Discharge: 2012-09-16 | DRG: 246 | Disposition: A | Discharge status: Home / Self Care | Payer: PRIVATE HEALTH INSURANCE | Attending: Cardiovascular Disease | Admitting: Cardiovascular Disease

## 2012-09-08 ENCOUNTER — Emergency Department (HOSPITAL_COMMUNITY): Payer: PRIVATE HEALTH INSURANCE

## 2012-09-08 ENCOUNTER — Other Ambulatory Visit: Payer: Self-pay

## 2012-09-08 ENCOUNTER — Encounter (HOSPITAL_COMMUNITY): Payer: Self-pay | Admitting: *Deleted

## 2012-09-08 DIAGNOSIS — J449 Chronic obstructive pulmonary disease, unspecified: Secondary | ICD-10-CM | POA: Diagnosis present

## 2012-09-08 DIAGNOSIS — E1142 Type 2 diabetes mellitus with diabetic polyneuropathy: Secondary | ICD-10-CM | POA: Diagnosis present

## 2012-09-08 DIAGNOSIS — N179 Acute kidney failure, unspecified: Secondary | ICD-10-CM | POA: Diagnosis not present

## 2012-09-08 DIAGNOSIS — J4489 Other specified chronic obstructive pulmonary disease: Secondary | ICD-10-CM | POA: Diagnosis present

## 2012-09-08 DIAGNOSIS — R0609 Other forms of dyspnea: Secondary | ICD-10-CM | POA: Diagnosis present

## 2012-09-08 DIAGNOSIS — Z9861 Coronary angioplasty status: Secondary | ICD-10-CM

## 2012-09-08 DIAGNOSIS — I10 Essential (primary) hypertension: Secondary | ICD-10-CM | POA: Diagnosis present

## 2012-09-08 DIAGNOSIS — I5021 Acute systolic (congestive) heart failure: Secondary | ICD-10-CM | POA: Diagnosis not present

## 2012-09-08 DIAGNOSIS — G4733 Obstructive sleep apnea (adult) (pediatric): Secondary | ICD-10-CM | POA: Diagnosis present

## 2012-09-08 DIAGNOSIS — E119 Type 2 diabetes mellitus without complications: Secondary | ICD-10-CM

## 2012-09-08 DIAGNOSIS — Z23 Encounter for immunization: Secondary | ICD-10-CM

## 2012-09-08 DIAGNOSIS — I2119 ST elevation (STEMI) myocardial infarction involving other coronary artery of inferior wall: Principal | ICD-10-CM | POA: Diagnosis present

## 2012-09-08 DIAGNOSIS — N189 Chronic kidney disease, unspecified: Secondary | ICD-10-CM | POA: Diagnosis not present

## 2012-09-08 DIAGNOSIS — R600 Localized edema: Secondary | ICD-10-CM

## 2012-09-08 DIAGNOSIS — Z794 Long term (current) use of insulin: Secondary | ICD-10-CM

## 2012-09-08 DIAGNOSIS — K219 Gastro-esophageal reflux disease without esophagitis: Secondary | ICD-10-CM | POA: Diagnosis present

## 2012-09-08 DIAGNOSIS — R609 Edema, unspecified: Secondary | ICD-10-CM

## 2012-09-08 DIAGNOSIS — I219 Acute myocardial infarction, unspecified: Secondary | ICD-10-CM

## 2012-09-08 DIAGNOSIS — E871 Hypo-osmolality and hyponatremia: Secondary | ICD-10-CM | POA: Diagnosis not present

## 2012-09-08 DIAGNOSIS — Z79899 Other long term (current) drug therapy: Secondary | ICD-10-CM

## 2012-09-08 DIAGNOSIS — I2510 Atherosclerotic heart disease of native coronary artery without angina pectoris: Secondary | ICD-10-CM

## 2012-09-08 DIAGNOSIS — N058 Unspecified nephritic syndrome with other morphologic changes: Secondary | ICD-10-CM | POA: Diagnosis present

## 2012-09-08 DIAGNOSIS — T50995A Adverse effect of other drugs, medicaments and biological substances, initial encounter: Secondary | ICD-10-CM | POA: Diagnosis not present

## 2012-09-08 DIAGNOSIS — Z955 Presence of coronary angioplasty implant and graft: Secondary | ICD-10-CM

## 2012-09-08 DIAGNOSIS — Z7982 Long term (current) use of aspirin: Secondary | ICD-10-CM

## 2012-09-08 DIAGNOSIS — N289 Disorder of kidney and ureter, unspecified: Secondary | ICD-10-CM | POA: Diagnosis not present

## 2012-09-08 DIAGNOSIS — I5031 Acute diastolic (congestive) heart failure: Secondary | ICD-10-CM

## 2012-09-08 DIAGNOSIS — R0989 Other specified symptoms and signs involving the circulatory and respiratory systems: Secondary | ICD-10-CM | POA: Diagnosis present

## 2012-09-08 DIAGNOSIS — E1149 Type 2 diabetes mellitus with other diabetic neurological complication: Secondary | ICD-10-CM | POA: Diagnosis present

## 2012-09-08 DIAGNOSIS — D649 Anemia, unspecified: Secondary | ICD-10-CM | POA: Diagnosis present

## 2012-09-08 DIAGNOSIS — I498 Other specified cardiac arrhythmias: Secondary | ICD-10-CM | POA: Diagnosis not present

## 2012-09-08 DIAGNOSIS — E785 Hyperlipidemia, unspecified: Secondary | ICD-10-CM | POA: Diagnosis present

## 2012-09-08 DIAGNOSIS — G510 Bell's palsy: Secondary | ICD-10-CM | POA: Diagnosis present

## 2012-09-08 DIAGNOSIS — R5383 Other fatigue: Secondary | ICD-10-CM

## 2012-09-08 DIAGNOSIS — A0472 Enterocolitis due to Clostridium difficile, not specified as recurrent: Secondary | ICD-10-CM

## 2012-09-08 DIAGNOSIS — D509 Iron deficiency anemia, unspecified: Secondary | ICD-10-CM | POA: Diagnosis present

## 2012-09-08 HISTORY — DX: Enterocolitis due to Clostridium difficile, not specified as recurrent: A04.72

## 2012-09-08 HISTORY — DX: Adverse effect of diagnostic agents, initial encounter: T50.8X5A

## 2012-09-08 HISTORY — DX: Contrast-induced nephropathy: N14.11

## 2012-09-08 HISTORY — DX: Nausea with vomiting, unspecified: R11.2

## 2012-09-08 HISTORY — DX: Other specified postprocedural states: Z98.890

## 2012-09-08 HISTORY — PX: LEFT HEART CATHETERIZATION WITH CORONARY ANGIOGRAM: SHX5451

## 2012-09-08 HISTORY — DX: Nephropathy induced by other drugs, medicaments and biological substances: N14.1

## 2012-09-08 HISTORY — DX: Anxiety disorder, unspecified: F41.9

## 2012-09-08 HISTORY — DX: Acute myocardial infarction, unspecified: I21.9

## 2012-09-08 LAB — CBC WITH DIFFERENTIAL/PLATELET
Basophils Absolute: 0 10*3/uL (ref 0.0–0.1)
Basophils Relative: 0 % (ref 0–1)
HCT: 30.1 % — ABNORMAL LOW (ref 39.0–52.0)
Lymphocytes Relative: 24 % (ref 12–46)
MCHC: 35.9 g/dL (ref 30.0–36.0)
Monocytes Absolute: 0.6 10*3/uL (ref 0.1–1.0)
Neutro Abs: 3.4 10*3/uL (ref 1.7–7.7)
Platelets: 169 10*3/uL (ref 150–400)
RDW: 12.8 % (ref 11.5–15.5)
WBC: 5.4 10*3/uL (ref 4.0–10.5)

## 2012-09-08 LAB — COMPREHENSIVE METABOLIC PANEL
ALT: 15 U/L (ref 0–53)
AST: 20 U/L (ref 0–37)
Albumin: 2.7 g/dL — ABNORMAL LOW (ref 3.5–5.2)
Calcium: 9.4 mg/dL (ref 8.4–10.5)
Chloride: 96 mEq/L (ref 96–112)
Creatinine, Ser: 0.97 mg/dL (ref 0.50–1.35)
Sodium: 132 mEq/L — ABNORMAL LOW (ref 135–145)
Total Bilirubin: 0.5 mg/dL (ref 0.3–1.2)

## 2012-09-08 LAB — POCT I-STAT TROPONIN I: Troponin i, poc: 4.05 ng/mL (ref 0.00–0.08)

## 2012-09-08 LAB — TROPONIN I
Troponin I: 4.04 ng/mL (ref <0.30)
Troponin I: 4.2 ng/mL (ref <0.30)

## 2012-09-08 LAB — GLUCOSE, CAPILLARY: Glucose-Capillary: 268 mg/dL — ABNORMAL HIGH (ref 70–99)

## 2012-09-08 SURGERY — LEFT HEART CATHETERIZATION WITH CORONARY ANGIOGRAM
Anesthesia: LOCAL

## 2012-09-08 MED ORDER — HEPARIN SODIUM (PORCINE) 5000 UNIT/ML IJ SOLN
5000.0000 [IU] | Freq: Three times a day (TID) | INTRAMUSCULAR | Status: DC
  Administered 2012-09-08 – 2012-09-16 (×23): 5000 [IU] via SUBCUTANEOUS
  Filled 2012-09-08 (×26): qty 1

## 2012-09-08 MED ORDER — HEPARIN SODIUM (PORCINE) 5000 UNIT/ML IJ SOLN: 4000.0000 [IU] | Freq: Once | INTRAMUSCULAR | Status: DC

## 2012-09-08 MED ORDER — INSULIN ASPART 100 UNIT/ML ~~LOC~~ SOLN
0.0000 [IU] | Freq: Three times a day (TID) | SUBCUTANEOUS | Status: DC
  Administered 2012-09-09: 8 [IU] via SUBCUTANEOUS
  Administered 2012-09-09: 5 [IU] via SUBCUTANEOUS
  Administered 2012-09-09 – 2012-09-10 (×2): 2 [IU] via SUBCUTANEOUS
  Administered 2012-09-11: 19:00:00 5 [IU] via SUBCUTANEOUS
  Administered 2012-09-11 (×2): 3 [IU] via SUBCUTANEOUS
  Administered 2012-09-12 (×2): 2 [IU] via SUBCUTANEOUS
  Administered 2012-09-13: 3 [IU] via SUBCUTANEOUS
  Administered 2012-09-13 – 2012-09-14 (×3): 2 [IU] via SUBCUTANEOUS
  Administered 2012-09-14: 3 [IU] via SUBCUTANEOUS
  Administered 2012-09-15 – 2012-09-16 (×4): 2 [IU] via SUBCUTANEOUS

## 2012-09-08 MED ORDER — FUROSEMIDE 40 MG PO TABS: 40.0000 mg | ORAL_TABLET | Freq: Two times a day (BID) | ORAL | Status: DC

## 2012-09-08 MED ORDER — SODIUM CHLORIDE 0.9 % IJ SOLN
3.0000 mL | Freq: Two times a day (BID) | INTRAMUSCULAR | Status: DC
  Administered 2012-09-08 – 2012-09-12 (×7): 3 mL via INTRAVENOUS
  Administered 2012-09-12: 12:00:00 10 mL via INTRAVENOUS
  Administered 2012-09-13 – 2012-09-16 (×8): 3 mL via INTRAVENOUS

## 2012-09-08 MED ORDER — MIDAZOLAM HCL 2 MG/2ML IJ SOLN
INTRAMUSCULAR | Status: AC
  Filled 2012-09-08: qty 2

## 2012-09-08 MED ORDER — NITROGLYCERIN 0.4 MG SL SUBL
0.4000 mg | SUBLINGUAL_TABLET | SUBLINGUAL | Status: DC | PRN
  Administered 2012-09-09: 0.4 mg via SUBLINGUAL
  Filled 2012-09-08: qty 25

## 2012-09-08 MED ORDER — ACETAMINOPHEN 325 MG PO TABS
650.0000 mg | ORAL_TABLET | ORAL | Status: DC | PRN
  Administered 2012-09-10 – 2012-09-13 (×2): 650 mg via ORAL
  Filled 2012-09-08 (×2): qty 2

## 2012-09-08 MED ORDER — FUROSEMIDE 40 MG PO TABS
40.0000 mg | ORAL_TABLET | Freq: Two times a day (BID) | ORAL | Status: DC
  Administered 2012-09-09: 40 mg via ORAL
  Filled 2012-09-08 (×3): qty 1

## 2012-09-08 MED ORDER — SODIUM CHLORIDE 0.9 % IV SOLN: 250.0000 mL | INTRAVENOUS | Status: DC | PRN

## 2012-09-08 MED ORDER — BIVALIRUDIN 250 MG IV SOLR
INTRAVENOUS | Status: AC
  Filled 2012-09-08: qty 250

## 2012-09-08 MED ORDER — ALPRAZOLAM 0.5 MG PO TABS
0.5000 mg | ORAL_TABLET | Freq: Every evening | ORAL | Status: DC | PRN
  Administered 2012-09-09: 0.5 mg via ORAL
  Filled 2012-09-08: qty 1

## 2012-09-08 MED ORDER — LIDOCAINE HCL (PF) 1 % IJ SOLN
INTRAMUSCULAR | Status: AC
  Filled 2012-09-08: qty 30

## 2012-09-08 MED ORDER — ASPIRIN 81 MG PO CHEW
81.0000 mg | CHEWABLE_TABLET | Freq: Every day | ORAL | Status: DC
  Administered 2012-09-09 – 2012-09-16 (×8): 81 mg via ORAL
  Filled 2012-09-08 (×8): qty 1

## 2012-09-08 MED ORDER — FENTANYL CITRATE 0.05 MG/ML IJ SOLN
INTRAMUSCULAR | Status: AC
  Filled 2012-09-08: qty 1

## 2012-09-08 MED ORDER — NITROGLYCERIN 0.2 MG/ML ON CALL CATH LAB
INTRAVENOUS | Status: AC
  Filled 2012-09-08: qty 1

## 2012-09-08 MED ORDER — ASPIRIN 81 MG PO CHEW
324.0000 mg | CHEWABLE_TABLET | Freq: Once | ORAL | Status: AC
  Administered 2012-09-08: 324 mg via ORAL

## 2012-09-08 MED ORDER — SODIUM CHLORIDE 0.9 % IV SOLN
INTRAVENOUS | Status: AC
  Administered 2012-09-08: 75 mL/h via INTRAVENOUS

## 2012-09-08 MED ORDER — NITROGLYCERIN IN D5W 200-5 MCG/ML-% IV SOLN
INTRAVENOUS | Status: AC
  Administered 2012-09-08: 5 ug via INTRAVENOUS
  Filled 2012-09-08: qty 250

## 2012-09-08 MED ORDER — ONDANSETRON HCL 4 MG/2ML IJ SOLN
4.0000 mg | Freq: Four times a day (QID) | INTRAMUSCULAR | Status: DC | PRN
  Administered 2012-09-08 – 2012-09-12 (×4): 4 mg via INTRAVENOUS
  Filled 2012-09-08 (×4): qty 2

## 2012-09-08 MED ORDER — BUDESONIDE-FORMOTEROL FUMARATE 160-4.5 MCG/ACT IN AERO
2.0000 | INHALATION_SPRAY | Freq: Two times a day (BID) | RESPIRATORY_TRACT | Status: DC
  Administered 2012-09-08 – 2012-09-16 (×14): 2 via RESPIRATORY_TRACT
  Filled 2012-09-08: qty 6

## 2012-09-08 MED ORDER — ASPIRIN 81 MG PO CHEW
CHEWABLE_TABLET | ORAL | Status: AC
  Administered 2012-09-08: 324 mg via ORAL
  Filled 2012-09-08: qty 4

## 2012-09-08 MED ORDER — INSULIN GLARGINE 100 UNIT/ML ~~LOC~~ SOLN
75.0000 [IU] | Freq: Every day | SUBCUTANEOUS | Status: DC
Start: 2012-09-08 — End: 2012-09-16
  Administered 2012-09-08 – 2012-09-15 (×8): 75 [IU] via SUBCUTANEOUS

## 2012-09-08 MED ORDER — ACETAMINOPHEN 325 MG PO TABS: 650.0000 mg | ORAL_TABLET | ORAL | Status: DC | PRN

## 2012-09-08 MED ORDER — PRASUGREL HCL 10 MG PO TABS
ORAL_TABLET | ORAL | Status: AC
  Filled 2012-09-08: qty 6

## 2012-09-08 MED ORDER — ALBUTEROL SULFATE HFA 108 (90 BASE) MCG/ACT IN AERS: 2.0000 | INHALATION_SPRAY | Freq: Four times a day (QID) | RESPIRATORY_TRACT | Status: DC | PRN

## 2012-09-08 MED ORDER — ONDANSETRON HCL 4 MG/2ML IJ SOLN: 4.0000 mg | Freq: Four times a day (QID) | INTRAMUSCULAR | Status: DC | PRN

## 2012-09-08 MED ORDER — NITROGLYCERIN IN D5W 200-5 MCG/ML-% IV SOLN
10.0000 ug/min | INTRAVENOUS | Status: DC
  Administered 2012-09-08: 5 ug via INTRAVENOUS

## 2012-09-08 MED ORDER — PANTOPRAZOLE SODIUM 40 MG PO TBEC
40.0000 mg | DELAYED_RELEASE_TABLET | Freq: Two times a day (BID) | ORAL | Status: DC
  Administered 2012-09-09 – 2012-09-16 (×15): 40 mg via ORAL
  Filled 2012-09-08 (×17): qty 1

## 2012-09-08 MED ORDER — HEPARIN (PORCINE) IN NACL 100-0.45 UNIT/ML-% IJ SOLN
INTRAMUSCULAR | Status: AC
  Administered 2012-09-08: 4000 [IU]
  Filled 2012-09-08: qty 250

## 2012-09-08 MED ORDER — METOPROLOL TARTRATE 12.5 MG HALF TABLET
12.5000 mg | ORAL_TABLET | Freq: Two times a day (BID) | ORAL | Status: DC
  Administered 2012-09-08 – 2012-09-09 (×2): 12.5 mg via ORAL
  Filled 2012-09-08 (×3): qty 1

## 2012-09-08 MED ORDER — SODIUM CHLORIDE 0.9 % IJ SOLN
3.0000 mL | INTRAMUSCULAR | Status: DC | PRN
  Administered 2012-09-13: 3 mL via INTRAVENOUS

## 2012-09-08 MED ORDER — MORPHINE SULFATE 2 MG/ML IJ SOLN: 2.0000 mg | INTRAMUSCULAR | Status: DC | PRN

## 2012-09-08 MED ORDER — HEPARIN (PORCINE) IN NACL 2-0.9 UNIT/ML-% IJ SOLN
INTRAMUSCULAR | Status: AC
  Filled 2012-09-08: qty 1000

## 2012-09-08 MED ORDER — FUROSEMIDE 40 MG PO TABS
40.0000 mg | ORAL_TABLET | Freq: Two times a day (BID) | ORAL | Status: DC
  Filled 2012-09-08: qty 1

## 2012-09-08 MED ORDER — DOXAZOSIN MESYLATE 4 MG PO TABS
4.0000 mg | ORAL_TABLET | Freq: Every day | ORAL | Status: DC
  Administered 2012-09-08 – 2012-09-15 (×8): 4 mg via ORAL
  Filled 2012-09-08 (×9): qty 1

## 2012-09-08 MED ORDER — ASPIRIN 325 MG PO TABS: 325.0000 mg | ORAL_TABLET | Freq: Once | ORAL | Status: DC

## 2012-09-08 MED ORDER — PRASUGREL HCL 10 MG PO TABS
10.0000 mg | ORAL_TABLET | Freq: Every day | ORAL | Status: DC
  Administered 2012-09-09 – 2012-09-16 (×8): 10 mg via ORAL
  Filled 2012-09-08 (×8): qty 1

## 2012-09-08 MED ORDER — IRBESARTAN 300 MG PO TABS
300.0000 mg | ORAL_TABLET | Freq: Every day | ORAL | Status: DC
  Filled 2012-09-08 (×4): qty 1

## 2012-09-08 MED ORDER — METOPROLOL TARTRATE 1 MG/ML IV SOLN
5.0000 mg | Freq: Once | INTRAVENOUS | Status: AC
  Administered 2012-09-08: 5 mg via INTRAVENOUS
  Filled 2012-09-08: qty 5

## 2012-09-08 MED ORDER — ASPIRIN EC 81 MG PO TBEC: 81.0000 mg | DELAYED_RELEASE_TABLET | Freq: Every day | ORAL | Status: DC

## 2012-09-08 NOTE — H&P (Signed)
Patient ID: Eugene [name] MRN: [id number], DOB/AGE: 1956/02/22   Admit date: 09/08/2012   Primary Physician: [name], MD Primary Cardiologist: R. Lattie Haw, MD Primary Pulmonologist: Susann Givens, MD  Pt. Profile:  57 y/o male with prior h/o RCA stenting who presented to the [facility] ED with a 3 day h/o intermittent chest pain and dyspnea and has been found to have an inferior STEMI.  Problem List  Past Medical History  Diagnosis Date  . Arteriosclerotic cardiovascular disease (ASCVD)     a. 05/2011 Cath/PCI: LM nl, LAD 63m, D1 small, D2 small 79m, LCX large 40p, RCA 50-60p, 99 hazy @ origin of PDA with 70-80 in PDA (2.5x26 Resolute Integrity & 3.0x15 Resolute Integrity DES).;  b. 05/2012 Echo: EF 55-60%  . Hyperlipidemia   . Diabetes mellitus     Peripheral neuropathy  . Bell palsy   . Hypertension   . COPD (chronic obstructive pulmonary disease)   . Sleep apnea   . Gallstones   . GERD (gastroesophageal reflux disease) 05/10/2012  . Nephrolithiasis   . Cholelithiasis 07/2012    Asymptomatic; identified incidentally    Past Surgical History  Procedure Date  . Circumcision   . Stents   . Esophagogastroduodenoscopy     in [place] over 20 yrs ago  . Colonoscopy     In [facility], approximately 2011 per patient, was normal. Advised to come back in 10 years.  . Esophagogastroduodenoscopy 06/12/2012    Procedure: ESOPHAGOGASTRODUODENOSCOPY (EGD);  Surgeon: Daneil Dolin, MD;  Location: [facility];  Service: Endoscopy;  Laterality: N/A;  9:45    Allergies  Allergies  Allergen Reactions  . Hydrocodone Nausea And Vomiting  . Statins Other (See Comments)    Muscle aches   HPI  57 y/o male with the above problem list.  He is s/p PCI/DES of the RCA x 2 in 05/2011 and was on effient for 1 yr following stenting.  He was in his USOH until approximately 3 days ago when he began to experience intermittent, nagging, lower chest/epigastric discomfort.   In that setting, he also began to note doe and mild orthopnea.  He awoke during the night last night with diaphoresis and dyspnea.  Due to persistent dyspnea, he presented to the [facility] ED this afternoon where ECG showed inferolateral ST elevation.  He was treated with asa, lopressor, ntg, and heparin and Code STEMI was activated.  Pt arrived to [facility] w/o complaints of chest pain.  He is currently hemodynamically stable.  Cath is underway.  Home Medications  Prior to Admission medications   Medication Sig Start Date End Date Taking? Authorizing Provider  albuterol (PROAIR HFA) 108 (90 BASE) MCG/ACT inhaler Inhale 2 puffs into the lungs every 6 (six) hours as needed. For shortness of breath    Historical Provider, MD  ALPRAZolam Duanne Moron) 0.5 MG tablet Take 0.5 mg by mouth at bedtime as needed. Sleep aid    Historical Provider, MD  aspirin EC 81 MG tablet Take 81 mg by mouth at bedtime.     Historical Provider, MD  budesonide-formoterol (SYMBICORT) 160-4.5 MCG/ACT inhaler Inhale 2 puffs into the lungs 2 (two) times daily.    Historical Provider, MD  doxazosin (CARDURA) 4 MG tablet Take 1 tablet (4 mg total) by mouth at bedtime. 08/18/12   Yehuda Savannah, MD  Flaxseed, Linseed, (FLAXSEED OIL) 1000 MG CAPS Take 2,000 mg by mouth daily.     Historical Provider, MD  furosemide (LASIX) 40 MG tablet Take  40 mg twice a day 08/01/12   Yehuda Savannah, MD  insulin aspart (NOVOLOG) 100 UNIT/ML injection Inject 3-25 Units into the skin 2 (two) times daily. SLIDING SCALE  NOVOLOG INSULIN BEFORE BREAKFAST AND BEFORE DINNER:   FOR A BLOOD SUGAR OF 70-120 GIVE 0 UNITS.   FOR BLOOD SUGAR OF 121-150 GIVE 3 UNITS.   FOR BLOOD SUGAR OF 151-200 GIVE 4 UNITS.   FOR BLOOD SUGAR 201-250 GIVE 7 UNITS.   FOR BLOOD SUGAR 251-300 GIVE 11 UNITS.   FOR BLOOD SUGAR 301-350 GIVE 15 UNITS.   FOR BLOOD SUGAR 351-400 GIVE 20 UNITS.    FOR BLOOD SUGAR OVER 400 GIVE 25 UNITS AND CALL YOUR DOCTOR. 05/11/12   Rexene Alberts, MD  insulin  glargine (LANTUS) 100 UNIT/ML injection Inject 75 Units into the skin at bedtime. 05/11/12 05/11/13  Rexene Alberts, MD  pantoprazole (PROTONIX) 40 MG tablet Take 1 tablet (40 mg total) by mouth 2 (two) times daily before a meal. 08/07/12 08/07/13  Mahala Menghini, PA  promethazine (PHENERGAN) 25 MG tablet Take 1 tablet (25 mg total) by mouth every 6 (six) hours as needed for nausea. 06/01/12   [name], MD  valsartan (DIOVAN) 320 MG tablet Take 320 mg by mouth daily. 06/03/12   [name], MD   Family History  Family History  Problem Relation Age of Onset  . Diabetes Mother   . Heart attack Mother   . Stroke Mother   . Diabetes Sister   . Sleep apnea Sister   . Hypertension Brother   . Diabetes Brother   . Colon cancer Neg Hx   . Liver disease Neg Hx    Social History  History   Social History  . Marital Status: Married    Spouse Name: N/A    Number of Children: 2  . Years of Education: N/A   Occupational History  . Shipping     [employer]   Social History Main Topics  . Smoking status: Never Smoker   . Smokeless tobacco: Not on file  . Alcohol Use: No     Comment: heavy etoh use 30 years ago  . Drug Use: No  . Sexually Active: Yes    Birth Control/ Protection: None   Other Topics Concern  . 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 Disabled at this point.    Review of Systems General:  No chills, fever, night sweats or weight changes.  Cardiovascular:  +++ diaphoresis, dyspnea, and nagging chest pain over past 3 days.  He has had some bilat LE edema and orthopnea.  No palpitations, paroxysmal nocturnal dyspnea. Dermatological: No rash, lesions/masses Respiratory: No cough, +++ dyspnea Urologic: No hematuria, dysuria Abdominal:   +++ nausea, no vomiting, diarrhea, bright red blood per rectum, melena, or hematemesis Neurologic:  No visual changes, wkns, changes in mental status. All other systems  reviewed and are otherwise negative except as noted above.  Physical Exam  Blood pressure 180/96, pulse 99, temperature 97.8 F (36.6 C), temperature source Oral, resp. rate 21, height 6\' 1"  (1.854 m), weight 246 lb (111.585 kg), SpO2 100.00%.  General: Pleasant, NAD Psych: Normal affect. Neuro: Alert and oriented X 3. Moves all extremities spontaneously. HEENT: Normal  Neck: Supple without bruits or JVD. Lungs:  Resp regular and unlabored, CTA. Heart: RRR no s3, s4, or murmurs. Abdomen: Soft, non-tender, non-distended, BS + x 4.  Extremities: No  clubbing, cyanosis or edema. DP/PT/Radials 2+ and equal bilaterally.  Labs   [facility] 09/08/12 1814  CKTOTAL --  CKMB --  TROPONINI 4.04*   Lab Results  Component Value Date   WBC 5.4 09/08/2012   HGB 10.8* 09/08/2012   HCT 30.1* 09/08/2012   MCV 84.3 09/08/2012   PLT 169 09/08/2012    Lab 09/08/12 1814  NA 132*  K 4.2  CL 96  CO2 26  BUN 27*  CREATININE 0.97  CALCIUM 9.4  PROT 6.6  BILITOT 0.5  ALKPHOS 77  ALT 15  AST 20  GLUCOSE 352*   Lab Results  Component Value Date   CHOL 382* 02/25/2012   HDL 39* 02/25/2012   LDLCALC NOT CALC 02/25/2012   TRIG 1243* 02/25/2012   Radiology/Studies  Dg Chest 1 View  09/08/2012  *RADIOLOGY REPORT*  Clinical Data: Chest pain  CHEST - 1 VIEW  Comparison: 06/16/2012  Findings: Low lung volumes.  Bibasilar atelectasis.  Allowing for low volumes, the heart is upper normal in size.  Pulmonary vascularity is within normal limits.  No Kerley B lines to suggest edema.  No pneumothorax.  No acute bony deformity.  IMPRESSION: Bibasilar atelectasis.  No evidence of pulmonary edema.   Original Report Authenticated By: Marybelle Killings, M.D.    ECG  Rsr, 98, inferolateral ST elevation.  ASSESSMENT AND PLAN  1.  Subacute to Acute inferolateral STEMI/CAD:  Pt presents with a 3 day h/o intermittent, nagging chest discomfort with worsening dyspnea, orthopnea, and diaphoresis this AM.  ECG is consistent with  STEMI.  Troponin is +.  Cath is underway and has revealed occluded distal RCA.  He will be loaded with effient.  Cont asa, arb.  Add bb.  He has been shown to be intolerant to multiple statins in the past.  2.  HTN:  BP markedly elevated at this time.  Follow post-PCI.  Adding bb.  3.  HL:  Statin intolerant.  4.  DM:  Add SSI.  Signed, Murray Hodgkins, NP 09/08/2012, 7:35 PM  Patient seen, examined. Available data reviewed. Agree with findings, assessment, and plan as outlined by Ignacia Bayley, NP. 57 year-old male with inferior STEMI. EKG reviewed and meets diagnostic criteria for STEMI. Symptoms clearly longer than 12 hours, now with 3 days of epigastric discomfort, shortness of breath. No history of substernal chest pain. Exam reveals an obese male in NAD, heart is regular without murmur or gallop, lung fields clear. Plan for emergency cardiac cath and possible PCI. Explained procedure to patient and emergency implied consent obtained.  [name], M.D. 09/08/2012 8:30 PM

## 2012-09-08 NOTE — ED Provider Notes (Addendum)
History     CSN: [id number]  Arrival date & time 09/08/12  1714   First MD Initiated Contact with Patient 09/08/12 1725      Chief Complaint  Patient presents with  . Cough  . Abdominal Pain    (Consider location/radiation/quality/duration/timing/severity/associated sxs/prior treatment) HPI.Marland KitchenMarland KitchenLeft upper quadrant/left inferior chest pain described as "gripping" intermittently since yesterday with associated dyspnea.   No frank substernal chest pain, diaphoresis, nausea.  Cardiac risk factors include diabetes, hypertension, hyperlipidemia, obesity, known coronary artery disease with 2 stents approximately one year ago.  Level V caveat for urgent need for intervention  Past Medical History  Diagnosis Date  . Arteriosclerotic cardiovascular disease (ASCVD)     Cath in 05/2011: Mild pulmonary hypertension, hyperdynamic LV, 50% LAD, severe first diagonal disease in a small vessel, 80% mid D2 also small, 40% circumflex, 50-60% RCA proximally and 99% distally-DES x2  . Hyperlipidemia   . Diabetes mellitus     Peripheral neuropathy  . Bell palsy   . Abnormal cardiac cath   . Hypertension   . COPD (chronic obstructive pulmonary disease)   . Sleep apnea   . Gallstones   . GERD (gastroesophageal reflux disease) 05/10/2012  . Nephrolithiasis   . Cholelithiasis 07/2012    Asymptomatic; identified incidentally    Past Surgical History  Procedure Date  . Circumcision   . Stents   . Esophagogastroduodenoscopy     in [place] over 20 yrs ago  . Colonoscopy     In [facility], approximately 2011 per patient, was normal. Advised to come back in 10 years.  . Esophagogastroduodenoscopy 06/12/2012    Procedure: ESOPHAGOGASTRODUODENOSCOPY (EGD);  Surgeon: Daneil Dolin, MD;  Location: [facility];  Service: Endoscopy;  Laterality: N/A;  9:45    Family History  Problem Relation Age of Onset  . Diabetes Mother   . Heart attack Mother   . Stroke Mother   . Diabetes Sister     . Sleep apnea Sister   . Hypertension Brother   . Diabetes Brother   . Colon cancer Neg Hx   . Liver disease Neg Hx     History  Substance Use Topics  . Smoking status: Never Smoker   . Smokeless tobacco: Not on file  . Alcohol Use: No     Comment: heavy etoh use 30 years ago      Review of Systems  Unable to perform ROS: Other    Allergies  Hydrocodone and Statins  Home Medications   Current Outpatient Rx  Name  Route  Sig  Dispense  Refill  . ALBUTEROL SULFATE HFA 108 (90 BASE) MCG/ACT IN AERS   Inhalation   Inhale 2 puffs into the lungs every 6 (six) hours as needed. For shortness of breath         . ALPRAZOLAM 0.5 MG PO TABS   Oral   Take 0.5 mg by mouth at bedtime as needed. Sleep aid         . ASPIRIN EC 81 MG PO TBEC   Oral   Take 81 mg by mouth at bedtime.          . BUDESONIDE-FORMOTEROL FUMARATE 160-4.5 MCG/ACT IN AERO   Inhalation   Inhale 2 puffs into the lungs 2 (two) times daily.         [name] DOXAZOSIN MESYLATE 4 MG PO TABS   Oral   Take 1 tablet (4 mg total) by mouth at bedtime.   30 tablet  11   . FLAXSEED OIL 1000 MG PO CAPS   Oral   Take 2,000 mg by mouth daily.          . FUROSEMIDE 40 MG PO TABS      Take 40 mg twice a day   60 tablet   6   . INSULIN ASPART 100 UNIT/ML [place] SOLN   Subcutaneous   Inject 3-25 Units into the skin 2 (two) times daily. SLIDING SCALE  NOVOLOG INSULIN BEFORE BREAKFAST AND BEFORE DINNER:   FOR A BLOOD SUGAR OF 70-120 GIVE 0 UNITS.   FOR BLOOD SUGAR OF 121-150 GIVE 3 UNITS.   FOR BLOOD SUGAR OF 151-200 GIVE 4 UNITS.   FOR BLOOD SUGAR 201-250 GIVE 7 UNITS.   FOR BLOOD SUGAR 251-300 GIVE 11 UNITS.   FOR BLOOD SUGAR 301-350 GIVE 15 UNITS.   FOR BLOOD SUGAR 351-400 GIVE 20 UNITS.    FOR BLOOD SUGAR OVER 400 GIVE 25 UNITS AND CALL YOUR DOCTOR.         . INSULIN GLARGINE 100 UNIT/ML [place] SOLN   Subcutaneous   Inject 75 Units into the skin at bedtime.         [name] PANTOPRAZOLE SODIUM 40 MG PO TBEC    Oral   Take 1 tablet (40 mg total) by mouth 2 (two) times daily before a meal.   60 tablet   2   . PROMETHAZINE HCL 25 MG PO TABS   Oral   Take 1 tablet (25 mg total) by mouth every 6 (six) hours as needed for nausea.   12 tablet   0   . VALSARTAN 320 MG PO TABS   Oral   Take 320 mg by mouth daily.           BP 180/96  Pulse 99  Temp 97.8 F (36.6 C) (Oral)  Resp 21  Ht 6\' 1"  (1.854 m)  Wt 246 lb (111.585 kg)  BMI 32.46 kg/m2  SpO2 100%  Physical Exam  Nursing note and vitals reviewed. Constitutional: He is oriented to person, place, and time. He appears well-developed and well-nourished.       Obese, good color, no acute distress  HENT:  Head: Normocephalic and atraumatic.  Eyes: Conjunctivae normal and EOM are normal. Pupils are equal, round, and reactive to light.  Neck: Normal range of motion. Neck supple.  Cardiovascular: Normal rate, regular rhythm and normal heart sounds.   Pulmonary/Chest: Effort normal and breath sounds normal.  Abdominal: Soft. Bowel sounds are normal.  Musculoskeletal: Normal range of motion.  Neurological: He is alert and oriented to person, place, and time.  Skin: Skin is warm and dry.  Psychiatric: He has a normal mood and affect.    ED Course  Procedures (including critical care time)  Labs Reviewed  CBC WITH DIFFERENTIAL - Abnormal; Notable for the following:    RBC 3.57 (*)     Hemoglobin 10.8 (*)     HCT 30.1 (*)     All other components within normal limits  COMPREHENSIVE METABOLIC PANEL  LIPASE, BLOOD  TROPONIN I   No results found.   Date: 09/08/2012  Rate: 98  Rhythm: normal sinus rhythm  QRS Axis: normal  Intervals: normal  ST/T Wave abnormalities: ST elevations inferiorly  Conduction Disutrbances:right bundle branch block  Narrative Interpretation:   Old EKG Reviewed: changes noted    CRITICAL CARE Performed by: Nat Christen  ?  Total critical care time: 30  Critical care time was exclusive  of  separately billable procedures and treating other patients.  Critical care was necessary to treat or prevent imminent or life-threatening deterioration.  Critical care was time spent personally by me on the following activities: development of treatment plan with patient and/or surrogate as well as nursing, discussions with consultants, evaluation of patient's response to treatment, examination of patient, obtaining history from patient or surrogate, ordering and performing treatments and interventions, ordering and review of laboratory studies, ordering and review of radiographic studies, pulse oximetry and re-evaluation of patient's condition. 1. Acute MI, inferior wall       MDM  Electrocardiogram reveals ST elevation in inferior leads.  Patient is hemodynamically stable. Discussed with Dr. Burt Knack.  Transfer to [facility] catheterization lab.  IV heparin 4000 units, IV Lopressor 5 mg, aspirin 324 mg by mouth, IV nitroglycerin.       Nat Christen, MD 09/08/12 Lane, MD 09/08/12 [phone]

## 2012-09-08 NOTE — ED Notes (Signed)
Non productive cough x 3 days. Pt states "aggrivation" to LUQ. Denies chest pain. Recently dx with gall stones. Pt states same pain he had with last gallbladder attack. NAD.

## 2012-09-08 NOTE — Progress Notes (Signed)
Pt received from cath lab awake, alert and oriented with vigorous shivering. Cath Lab RNs to notify MD re shivering. Pt oriented to room and environ.  Cardiac monitor on, initial assessment and database done. R radial site noted, clean dry and intact. +CMS. Denies pain @ present, just very cold. Pt covered with multiple warm blankets. Will monitor.  Critical Lab (troponin = 4.20) called to Dr Colon Flattery. Ntg gtt discontinued per MD. Will monitor.

## 2012-09-08 NOTE — ED Notes (Signed)
Called [facility] with Code Stemi per Dr. Lacinda Axon

## 2012-09-08 NOTE — Interval H&P Note (Signed)
History and Physical Interval Note:  09/08/2012 8:36 PM  Eugene [name]  has presented today for surgery, with the diagnosis of stemi  The various methods of treatment have been discussed with the patient and family. After consideration of risks, benefits and other options for treatment, the patient has consented to  Procedure(s) (LRB) with comments: LEFT HEART CATHETERIZATION WITH CORONARY ANGIOGRAM (N/A) as a surgical intervention .  The patient's history has been reviewed, patient examined, no change in status, stable for surgery.  I have reviewed the patient's chart and labs.  Questions were answered to the patient's satisfaction.     [name]

## 2012-09-08 NOTE — ED Notes (Signed)
CRITICAL VALUE ALERT  Troponin 4.04    Date of notification:  09/08/12  Time of notification:  1904  Critical value read back:yes2  Nurse who received alert:  Randell Loop, RN  MD notified (1st page):  Dr. Lacinda Axon  Time of first page:  1904  MD notified (2nd page):  Time of second page:  Responding MD:  EMD  Time MD responded:  [phone]

## 2012-09-08 NOTE — CV Procedure (Signed)
   Cardiac Catheterization Procedure Note  Name: Eugene [name] MRN: [id number] DOB: [birth year]  Procedure: Left Heart Cath, Selective Coronary Angiography, LV angiography, PTCA and stenting of the RCA  Indication: Inferior STEMI, late presentation with vague symptoms but EKG meets STEMI criteria  Procedural Details:  The right wrist was prepped, draped, and anesthetized with 1% lidocaine. Using the modified Seldinger technique, a 6 French sheath was introduced into the right radial artery. 3 mg of verapamil was administered through the sheath, weight-based unfractionated heparin was administered intravenously. Standard Judkins catheters were used for selective coronary angiography and left ventriculography. Catheter exchanges were performed over an exchange length guidewire.  PROCEDURAL FINDINGS Hemodynamics: AO 138/80 LV 136/26   Coronary angiography: Coronary dominance: right  Left mainstem: Patent with minor irregularity. No significant stenosis  Left anterior descending (LAD): Diffuse disease in the proximal and mid vessel but no severe stenoses present. 50% proximal LAD stenosis, 50% diagonal stenosis (D1), diffuse distal vessel disease  Left circumflex (LCx): Widely patent, very large vessel. OM1 is large with mild nonobstructive disease 25% stenosis.  Right coronary artery (RCA): Proximal vessel 30-40% stenosis within previously implanted stent. Distal vessel 100% occluded prior to stented segment in PDA  Left ventriculography: Left ventricular systolic function is  Moderately depressed with severe hypokinesis of the inferior wall. The LVEF is estimated at 45%.  PCI Note:  Following the diagnostic procedure, the decision was made to proceed with PCI. Weight-based bivalirudin was given for anticoagulation. Effient 60 mg was administered after excluding any contraindications. Once a therapeutic ACT was achieved, a 6 [place] JR4 guide catheter was inserted.  A prowater coronary  guidewire was used to cross the lesion.  The initial occlusion was moderately difficult to cross and the post AV segment was wired. The occlusion was dilated with a 2.0 mm balloon but flow was not restored. The wire was then directed across the stented segment of PDA and the stent was dilated. This restored TIMI-3 flow. The lesion was then redilated with a 2.5x30 mm balloon.  The lesion was then stented with a 2.75x38 mm Promus Premier drug-eluting stent which was carefully positioned in the distal RCA extending into the proximal stented segment within the PDA.  The stent was postdilated with a 3.0 mm noncompliant balloon.  Following PCI, there was 0% residual stenosis and TIMI-3 flow. Final angiography confirmed an excellent result. The patient tolerated the procedure well. There were no immediate procedural complications. A TR band was used for radial hemostasis. The patient was transferred to the post catheterization recovery area for further monitoring.  PCI Data: Vessel - RCA/Segment - distal Percent Stenosis (pre)  100 TIMI-flow 0 Stent 2.75 x 38 mm Promus DES Percent Stenosis (post) 0 TIMI-flow (post) 3  Final Conclusions:   1. Acute inferior MI secondary to total occlusion of the distal RCA, treated successfully with primary PCI 2. Diffuse distal vessel disease of the LAD and moderate proximal LAD stenosis 3. Minor nonobstructive LCx stenosis 4. Moderate segmental LV dysfunction with an LVEF of 45% and inferior wall hypokinesis   Recommendations:  ASA/Effient at least 12 months. Consider long-term dual antiplatelet Rx if tolerated.  [name] 09/08/2012, 8:37 PM

## 2012-09-09 ENCOUNTER — Inpatient Hospital Stay (HOSPITAL_COMMUNITY): Payer: PRIVATE HEALTH INSURANCE

## 2012-09-09 LAB — BASIC METABOLIC PANEL
BUN: 29 mg/dL — ABNORMAL HIGH (ref 6–23)
Chloride: 99 mEq/L (ref 96–112)
Chloride: 97 mEq/L (ref 96–112)
GFR calc Af Amer: 71 mL/min — ABNORMAL LOW (ref >90)
GFR calc Af Amer: 33 mL/min — ABNORMAL LOW (ref >90)
GFR calc non Af Amer: 28 mL/min — ABNORMAL LOW (ref >90)
Potassium: 3.9 mEq/L (ref 3.5–5.1)
Potassium: 3.9 mEq/L (ref 3.5–5.1)
Sodium: 134 mEq/L — ABNORMAL LOW (ref 135–145)

## 2012-09-09 LAB — CBC
HCT: 29.1 % — ABNORMAL LOW (ref 39.0–52.0)
RBC: 3.44 MIL/uL — ABNORMAL LOW (ref 4.22–5.81)
RDW: 13.1 % (ref 11.5–15.5)
WBC: 5.4 10*3/uL (ref 4.0–10.5)

## 2012-09-09 LAB — MRSA PCR SCREENING: MRSA by PCR: NEGATIVE

## 2012-09-09 LAB — GLUCOSE, CAPILLARY
Glucose-Capillary: 227 mg/dL — ABNORMAL HIGH (ref 70–99)
Glucose-Capillary: 271 mg/dL — ABNORMAL HIGH (ref 70–99)
Glucose-Capillary: 144 mg/dL — ABNORMAL HIGH (ref 70–99)
Glucose-Capillary: 139 mg/dL — ABNORMAL HIGH (ref 70–99)

## 2012-09-09 LAB — MAGNESIUM: Magnesium: 2 mg/dL (ref 1.5–2.5)

## 2012-09-09 LAB — TROPONIN I
Troponin I: 9.29 ng/mL (ref <0.30)
Troponin I: 8.44 ng/mL (ref <0.30)

## 2012-09-09 LAB — LIPID PANEL
Cholesterol: 229 mg/dL — ABNORMAL HIGH (ref 0–200)
Total CHOL/HDL Ratio: 6.4 RATIO

## 2012-09-09 LAB — POCT ACTIVATED CLOTTING TIME: Activated Clotting Time: 328 seconds

## 2012-09-09 MED ORDER — BENZONATATE 100 MG PO CAPS
100.0000 mg | ORAL_CAPSULE | Freq: Three times a day (TID) | ORAL | Status: DC
  Administered 2012-09-09 – 2012-09-16 (×21): 100 mg via ORAL
  Filled 2012-09-09 (×24): qty 1

## 2012-09-09 MED ORDER — HYDROCOD POLST-CHLORPHEN POLST 10-8 MG/5ML PO LQCR: 5.0000 mL | Freq: Two times a day (BID) | ORAL | Status: DC

## 2012-09-09 MED ORDER — METOPROLOL TARTRATE 25 MG PO TABS
25.0000 mg | ORAL_TABLET | Freq: Two times a day (BID) | ORAL | Status: DC
  Administered 2012-09-09 – 2012-09-16 (×14): 25 mg via ORAL
  Filled 2012-09-09 (×15): qty 1

## 2012-09-09 MED ORDER — FUROSEMIDE 10 MG/ML IJ SOLN
INTRAMUSCULAR | Status: AC
  Filled 2012-09-09: qty 4

## 2012-09-09 MED ORDER — FUROSEMIDE 40 MG PO TABS
40.0000 mg | ORAL_TABLET | Freq: Two times a day (BID) | ORAL | Status: DC
  Administered 2012-09-10 (×2): 40 mg via ORAL
  Filled 2012-09-09 (×5): qty 1

## 2012-09-09 MED ORDER — GUAIFENESIN-DM 100-10 MG/5ML PO SYRP
5.0000 mL | ORAL_SOLUTION | ORAL | Status: DC | PRN
  Administered 2012-09-09: 5 mL via ORAL
  Filled 2012-09-09: qty 5

## 2012-09-09 MED ORDER — MIDAZOLAM HCL 2 MG/2ML IJ SOLN
INTRAMUSCULAR | Status: AC
  Filled 2012-09-09: qty 2

## 2012-09-09 MED ORDER — FUROSEMIDE 10 MG/ML IJ SOLN
40.0000 mg | Freq: Once | INTRAMUSCULAR | Status: AC
  Administered 2012-09-09: 40 mg via INTRAVENOUS

## 2012-09-09 MED ORDER — FENTANYL CITRATE 0.05 MG/ML IJ SOLN
INTRAMUSCULAR | Status: AC
  Filled 2012-09-09: qty 2

## 2012-09-09 MED ORDER — GUAIFENESIN-DM 100-10 MG/5ML PO SYRP
5.0000 mL | ORAL_SOLUTION | ORAL | Status: DC | PRN
  Administered 2012-09-09 – 2012-09-12 (×3): 5 mL via ORAL
  Filled 2012-09-09 (×4): qty 5

## 2012-09-09 MED FILL — Dextrose Inj 5%: INTRAVENOUS | Qty: 50 | Status: AC

## 2012-09-09 NOTE — Progress Notes (Signed)
Pt coughing and c/o sob - vital signs stable - pt's rhythm appears to be trigeminy - STAT EKG done and MD called and MD came to bedside; STAT port CXR done; orders recieved

## 2012-09-09 NOTE — Progress Notes (Signed)
Called to see patient due to ventricular trigeminy and shortness of breath.   Patient complained of cough associated with increased shortness of breath. CXR was ordered and cough syrup administered. Before transporting to radiology the nurse noted ventricular trigeminy on telemetry which has now resolved. Currently in NSR with rates in the 90s, occasional PVCs. O2 sats 92-94% on RA, 96-98% on 2L O2. He was initially short of breath with coughing. Now relaxed without complaints. No chest pain. He is alert and oriented. Lungs w/ bibasilar rales. Heart RRR, no murmurs or rubs. EKG shows persistent inferior ST elevation, no change from previous EKG. K+ from this morning 3.9.   Discussed with Dr. Acie Fredrickson who also evaluated patient. Will review CXR when available. Check BMET and Mg. Increase metoprolol to 25mg  bid. Give 40mg  IV lasix and nebulizer. Add Tessalon.  [place], PA-C 09/09/2012 4:38 PM  [phone] pgr   Attending Note:   The patient was seen and examined.  Agree with assessment and plan as noted above.   Pt may have some volume overload / transient CHF due to his Inf. MI and late presentation.  Will give a dose of Lasix, albuterol neb.    Check K and Mag ( frequent PVCs)_    Ramond Dial., MD, [facility] 09/09/2012, 5:55 PM

## 2012-09-09 NOTE — Progress Notes (Addendum)
PROGRESS NOTE  Subjective:   Eugene [name] is a 57 yo who presented with an Inferior wall RCA STEMI last night.  CP started at 4 PM.  He presented to [facility] ED with a 3 day hx of intermittent CP.  Was brought to [facility] for emergent cath. He was found to have an occluded RCA which was opened by Dr. Burt Knack  ( 2.75 x 38 mm Promus Premier stent dilated with a 3.0 noncompliant balloon.  Objective:    Vital Signs:   Temp:  [97.8 F (36.6 C)-99 F (37.2 C)] 98.6 F (37 C) (01/14 0745) Pulse Rate:  [79-105] 82  (01/14 0700) Resp:  [19-26] 21  (01/14 0300) BP: (94-180)/(46-96) 94/54 mmHg (01/14 0700) SpO2:  [96 %-100 %] 98 % (01/14 0700) Weight:  [243 lb 6.2 oz (110.4 kg)-246 lb (111.585 kg)] 245 lb 2.4 oz (111.2 kg) (01/14 0500)  Last BM Date: 09/08/12   24-hour weight change: Weight change:   Weight trends: Filed Weights   09/08/12 2115 09/08/12 2132 09/09/12 0500  Weight: 243 lb 6.2 oz (110.4 kg) 243 lb 6.2 oz (110.4 kg) 245 lb 2.4 oz (111.2 kg)    Intake/Output:  01/13 0701 - 01/14 0700 In: 1013 [P.O.:480; I.V.:533] Out: 600 [Urine:600]     Physical Exam: BP 94/54  Pulse 82  Temp 98.6 F (37 C) (Oral)  Resp 21  Ht 6\' 1"  (1.854 m)  Wt 245 lb 2.4 oz (111.2 kg)  BMI 32.34 kg/m2  SpO2 98%  General: Vital signs reviewed and noted.   Head: Normocephalic, atraumatic.  Eyes: conjunctivae/corneas clear.  EOM's intact.   Throat: normal  Neck:  thick, no JVD  Lungs:    basilar rales  Heart:  RR, no murmur  Abdomen:  Soft, non-tender, non-distended    Extremities: No edema, right radial cath site is OK   Neurologic: A&O X3, CN II - XII are grossly intact.   Psych: Normal     Labs: BMET:  Basename 09/09/12 0319 09/08/12 1814  NA 134* 132*  K 3.9 4.2  CL 99 96  CO2 25 26  GLUCOSE 282* 352*  BUN 29* 27*  CREATININE 1.28 0.97  CALCIUM 8.9 9.4  MG -- --  PHOS -- --    Liver function tests:  [facility] 09/08/12 1814  AST 20  ALT 15  ALKPHOS 77  BILITOT 0.5    PROT 6.6  ALBUMIN 2.7*    Basename 09/08/12 1814  LIPASE 11  AMYLASE --    CBC:  Basename 09/09/12 0319 09/08/12 1814  WBC 5.4 5.4  NEUTROABS -- 3.4  HGB 10.2* 10.8*  HCT 29.1* 30.1*  MCV 84.6 84.3  PLT 159 169    Cardiac Enzymes:  Basename 09/09/12 0313 09/08/12 2117 09/08/12 1814  CKTOTAL -- -- --  CKMB -- -- --  TROPONINI 9.29* 4.20* 4.04*    Coagulation Studies: No results found for this basename: LABPROT:5,INR:5 in the last 72 hours  Other: No components found with this basename: POCBNP:3 No results found for this basename: DDIMER in the last 72 hours No results found for this basename: HGBA1C in the last 72 hours  Basename 09/09/12 0319  CHOL 229*  HDL 36*  LDLCALC 117*  TRIG 381*  CHOLHDL 6.4   No results found for this basename: TSH,T4TOTAL,FREET3,T3FREE,THYROIDAB in the last 72 hours No results found for this basename: VITAMINB12,FOLATE,FERRITIN,TIBC,IRON,RETICCTPCT in the last 72 hours   Other results:  EKG - Jan. 14, 2014 - persistent ST elevation in  the inferior leads.  Medications:    Infusions:    Scheduled Medications:    . aspirin  81 mg Oral Daily  . budesonide-formoterol  2 puff Inhalation BID  . doxazosin  4 mg Oral QHS  . furosemide  40 mg Oral BID  . heparin  5,000 Units Subcutaneous Q8H  . insulin aspart  0-15 Units Subcutaneous TID WC  . insulin glargine  75 Units Subcutaneous QHS  . irbesartan  300 mg Oral Daily  . metoprolol tartrate  12.5 mg Oral BID  . pantoprazole  40 mg Oral BID AC  . prasugrel  10 mg Oral Daily  . sodium chloride  3 mL Intravenous Q12H    Assessment/ Plan:    1. Acute RCA STEMI:  He is mostly pain free at present.  He has a persistent slight ache but none of the severe CP that he had last night.   His ECG is unchanged and still shows significant ST elevation.  I have reviewed the angiograms from last night.   He has slow flow down the PDA for much of the case and there is a question of spasm  just proximal to the stent.  These abnormalities appear to have resolved with the final injection.  I have discussed with Dr. Burt Knack - he presented very late and it is not unexpected that he has persistent St elevation.  Will keep in ICU today.  Disposition:  Length of Stay: 1  Thayer Headings, Brooke Bonito., MD, [facility] 09/09/2012, 8:06 AM Office [phone] Pager [phone]

## 2012-09-09 NOTE — Progress Notes (Signed)
Pt sts not feeling well today, continued vague CP. Will hold ambulation. Began discussing MI/stent with pt. sts he needs to watch diet better. Will f/u tomorrow.  [place], ACSM

## 2012-09-09 NOTE — Progress Notes (Signed)
Utilization review completed.  P.J. Chao Blazejewski,RN,BSN Case Manager [phone]  

## 2012-09-09 NOTE — Progress Notes (Signed)
Inpatient Diabetes Program Recommendations  AACE/ADA: New Consensus Statement on Inpatient Glycemic Control (2013)  Target Ranges:  Prepandial:   less than 140 mg/dL      Peak postprandial:   less than 180 mg/dL (1-2 hours)      Critically ill patients:  140 - 180 mg/dL  Results for KEAIR, STEEVER (MRN [id number]) as of 09/09/2012 14:02  Ref. Range 09/08/2012 21:07 09/09/2012 07:28 09/09/2012 11:56  Glucose-Capillary Latest Range: 70-99 mg/dL 268 (H) 227 (H) 271 (H)    Inpatient Diabetes Program Recommendations Insulin - Basal: Increase Lantus to 85 units  Insulin - Meal Coverage: Add Novolog 5 units TID with meals per Glycemic Control Order set Will follow. Thank you  Raoul Pitch BSN, RN,CDE Inpatient Diabetes Coordinator [phone] (team pager)

## 2012-09-10 ENCOUNTER — Inpatient Hospital Stay (HOSPITAL_COMMUNITY): Payer: PRIVATE HEALTH INSURANCE

## 2012-09-10 DIAGNOSIS — N179 Acute kidney failure, unspecified: Secondary | ICD-10-CM

## 2012-09-10 DIAGNOSIS — E785 Hyperlipidemia, unspecified: Secondary | ICD-10-CM

## 2012-09-10 LAB — BASIC METABOLIC PANEL
CO2: 25 mEq/L (ref 19–32)
Chloride: 99 mEq/L (ref 96–112)
Creatinine, Ser: 3.07 mg/dL — ABNORMAL HIGH (ref 0.50–1.35)
Sodium: 136 mEq/L (ref 135–145)

## 2012-09-10 LAB — URINALYSIS, ROUTINE W REFLEX MICROSCOPIC
Ketones, ur: NEGATIVE mg/dL
Leukocytes, UA: NEGATIVE
Nitrite: NEGATIVE
Protein, ur: 30 mg/dL — AB
Urobilinogen, UA: 0.2 mg/dL (ref 0.0–1.0)

## 2012-09-10 LAB — GLUCOSE, CAPILLARY
Glucose-Capillary: 119 mg/dL — ABNORMAL HIGH (ref 70–99)
Glucose-Capillary: 119 mg/dL — ABNORMAL HIGH (ref 70–99)
Glucose-Capillary: 134 mg/dL — ABNORMAL HIGH (ref 70–99)

## 2012-09-10 LAB — URINE MICROSCOPIC-ADD ON

## 2012-09-10 MED ORDER — PIPERACILLIN-TAZOBACTAM 3.375 G IVPB
3.3750 g | Freq: Three times a day (TID) | INTRAVENOUS | Status: DC
  Administered 2012-09-10 – 2012-09-12 (×5): 3.375 g via INTRAVENOUS
  Filled 2012-09-10 (×9): qty 50

## 2012-09-10 MED ORDER — SODIUM CHLORIDE 0.9 % IV SOLN
1500.0000 mg | INTRAVENOUS | Status: DC
  Filled 2012-09-10: qty 1500

## 2012-09-10 MED ORDER — VANCOMYCIN HCL 10 G IV SOLR
2000.0000 mg | Freq: Once | INTRAVENOUS | Status: AC
  Administered 2012-09-10: 2000 mg via INTRAVENOUS
  Filled 2012-09-10: qty 2000

## 2012-09-10 NOTE — Progress Notes (Signed)
CARDIAC REHAB PHASE I   PRE:  Rate/Rhythm: 91SR  BP:  Supine:   Sitting: 117/67  Standing:    SaO2: 94%RA  MODE:  Ambulation: 150 ft   POST:  Rate/Rhythem: 96SR  BP:  Supine:   Sitting: 106/69  Standing:    SaO2: 99%RA 1040-1107 Pt walked 150 ft on RA with asst x 1. Pt very SOB. Had to stop several times with short walk to catch his breath. Sats good on RA upon return to room but pt exhausted. To recliner with call bell. C/o short pains across chest right after walk at 3-4/10 relieved with rest. Reviewed NTG use and calling 911. Did not do any more ed at this time due to pt so exhausted. He has nonproductive cough frequently and stated this was not unusual for him. Stated he has had cough for about 6 mos and seeing pulm MD.  Jeani Sow

## 2012-09-10 NOTE — Progress Notes (Signed)
ANTIBIOTIC CONSULT NOTE - INITIAL  Pharmacy Consult for zosyn/vancomycin Indication: rule out pneumonia  Allergies  Allergen Reactions  . Hydrocodone Nausea And Vomiting  . Statins Other (See Comments)    Muscle aches    Patient Measurements: Height: 6\' 1"  (185.4 cm) Weight: 245 lb 9.5 oz (111.4 kg) IBW/kg (Calculated) : 79.9   Vital Signs: Temp: 98.3 F (36.8 C) (01/15 0800) Temp src: Oral (01/15 0800) BP: 130/78 mmHg (01/15 0800) Pulse Rate: 88  (01/15 0800) Intake/Output from previous day: 01/14 0701 - 01/15 0700 In: 1084 [P.O.:1080; IV Piggyback:4] Out: 275 [Urine:275] Intake/Output from this shift:    Labs:  Basename 09/10/12 0610 09/09/12 1707 09/09/12 0319 09/08/12 1814  WBC -- -- 5.4 5.4  HGB -- -- 10.2* 10.8*  PLT -- -- 159 169  LABCREA -- -- -- --  CREATININE 3.07* 2.44* 1.28 --   Estimated Creatinine Clearance: 35.6 ml/min (by C-G formula based on Cr of 3.07). No results found for this basename: VANCOTROUGH:2,VANCOPEAK:2,VANCORANDOM:2,GENTTROUGH:2,GENTPEAK:2,GENTRANDOM:2,TOBRATROUGH:2,TOBRAPEAK:2,TOBRARND:2,AMIKACINPEAK:2,AMIKACINTROU:2,AMIKACIN:2, in the last 72 hours   Microbiology: Recent Results (from the past 720 hour(s))  MRSA PCR SCREENING     Status: Normal   Collection Time   09/08/12  9:12 PM      Component Value Range Status Comment   MRSA by PCR NEGATIVE  NEGATIVE Final     Medical History: Past Medical History  Diagnosis Date  . Arteriosclerotic cardiovascular disease (ASCVD)     a. 05/2011 Cath/PCI: LM nl, LAD 53m, D1 small, D2 small 93m, LCX large 40p, RCA 50-60p, 99 hazy @ origin of PDA with 70-80 in PDA (2.5x26 Resolute Integrity & 3.0x15 Resolute Integrity DES).;  b. 05/2012 Echo: EF 55-60%  . Hyperlipidemia   . Diabetes mellitus     Peripheral neuropathy  . Bell palsy   . Hypertension   . COPD (chronic obstructive pulmonary disease)   . Sleep apnea   . Gallstones   . GERD (gastroesophageal reflux disease) 05/10/2012  .  Nephrolithiasis   . Cholelithiasis 07/2012    Asymptomatic; identified incidentally  . PONV (postoperative nausea and vomiting)   . Anxiety   . Myocardial infarction   . Coronary artery disease   . Dysrhythmia     Medications:  Prescriptions prior to admission  Medication Sig Dispense Refill  . albuterol (PROAIR HFA) 108 (90 BASE) MCG/ACT inhaler Inhale 2 puffs into the lungs every 6 (six) hours as needed. For shortness of breath      . ALPRAZolam (XANAX) 0.5 MG tablet Take 0.5 mg by mouth at bedtime as needed. Sleep aid      . aspirin EC 81 MG tablet Take 81 mg by mouth at bedtime.       . budesonide-formoterol (SYMBICORT) 160-4.5 MCG/ACT inhaler Inhale 2 puffs into the lungs 2 (two) times daily.      [name] doxazosin (CARDURA) 4 MG tablet Take 1 tablet (4 mg total) by mouth at bedtime.  30 tablet  11  . Flaxseed, Linseed, (FLAXSEED OIL) 1000 MG CAPS Take 2,000 mg by mouth daily.       . furosemide (LASIX) 40 MG tablet Take 40 mg twice a day  60 tablet  6  . insulin aspart (NOVOLOG) 100 UNIT/ML injection Inject 3-25 Units into the skin 2 (two) times daily. SLIDING SCALE  NOVOLOG INSULIN BEFORE BREAKFAST AND BEFORE DINNER:   FOR A BLOOD SUGAR OF 70-120 GIVE 0 UNITS.   FOR BLOOD SUGAR OF 121-150 GIVE 3 UNITS.   FOR BLOOD SUGAR OF 151-200 GIVE  4 UNITS.   FOR BLOOD SUGAR 201-250 GIVE 7 UNITS.   FOR BLOOD SUGAR 251-300 GIVE 11 UNITS.   FOR BLOOD SUGAR 301-350 GIVE 15 UNITS.   FOR BLOOD SUGAR 351-400 GIVE 20 UNITS.    FOR BLOOD SUGAR OVER 400 GIVE 25 UNITS AND CALL YOUR DOCTOR.      [name] insulin glargine (LANTUS) 100 UNIT/ML injection Inject 75 Units into the skin at bedtime.      . pantoprazole (PROTONIX) 40 MG tablet Take 1 tablet (40 mg total) by mouth 2 (two) times daily before a meal.  60 tablet  2  . valsartan (DIOVAN) 320 MG tablet Take 320 mg by mouth daily.      . promethazine (PHENERGAN) 25 MG tablet Take 1 tablet (25 mg total) by mouth every 6 (six) hours as needed for nausea.  12 tablet  0    Scheduled:    . aspirin  81 mg Oral Daily  . benzonatate  100 mg Oral TID  . budesonide-formoterol  2 puff Inhalation BID  . doxazosin  4 mg Oral QHS  . [EXPIRED] fentaNYL      . [EXPIRED] furosemide      . [COMPLETED] furosemide  40 mg Intravenous Once  . furosemide  40 mg Oral BID  . heparin  5,000 Units Subcutaneous Q8H  . insulin aspart  0-15 Units Subcutaneous TID WC  . insulin glargine  75 Units Subcutaneous QHS  . metoprolol tartrate  25 mg Oral BID  . [EXPIRED] midazolam      . pantoprazole  40 mg Oral BID AC  . prasugrel  10 mg Oral Daily  . sodium chloride  3 mL Intravenous Q12H  . [DISCONTINUED] chlorpheniramine-HYDROcodone  5 mL Oral Q12H  . [DISCONTINUED] furosemide  40 mg Oral BID  . [DISCONTINUED] irbesartan  300 mg Oral Daily  . [DISCONTINUED] metoprolol tartrate  12.5 mg Oral BID   Assessment: 57 yo male s/p emergent cath and has been sob to start antibiotics for empiric coverage. SCr= 2.07 and trend up (1.28>>2.44>3.07) with CrCl~35.  Goal of Therapy:  Vancomycin trough level 15-20 mcg/ml  Plan:  -Zosyn 3.375gm IV q8h and vancomycin 2000 mg followed by 1500mg  IV q48h -Will follow renal function and clinical progress  Hildred Laser, Pharm D 09/10/2012 10:13 AM

## 2012-09-10 NOTE — Progress Notes (Addendum)
PROGRESS NOTE  Subjective:   Eugene [name] is a 57 yo who presented with an Inferior wall RCA STEMI last night.  CP started at 4 PM.  He presented to [facility] ED with a 3 day hx of intermittent CP.  Was brought to [facility] for emergent cath. He was found to have an occluded RCA which was opened by Dr. Burt Knack  ( 2.75 x 38 mm Promus Premier stent dilated with a 3.0 noncompliant balloon).  He had some dyspnea yesterday, lasix was started but he did not put out much urine.   His creatinine has increased significantly since his cath.  Objective:    Vital Signs:   Temp:  [98.1 F (36.7 C)-99.1 F (37.3 C)] 98.2 F (36.8 C) (01/15 0400) Pulse Rate:  [78-96] 81  (01/15 0500) Resp:  [20] 20  (01/14 1200) BP: (92-139)/(51-84) 101/58 mmHg (01/15 0500) SpO2:  [92 %-100 %] 97 % (01/15 0500) Weight:  [245 lb 9.5 oz (111.4 kg)] 245 lb 9.5 oz (111.4 kg) (01/15 0500)  Last BM Date: 09/08/12   24-hour weight change: Weight change: -6.5 oz (-0.185 kg)  Weight trends: Filed Weights   09/08/12 2132 09/09/12 0500 09/10/12 0500  Weight: 243 lb 6.2 oz (110.4 kg) 245 lb 2.4 oz (111.2 kg) 245 lb 9.5 oz (111.4 kg)    Intake/Output:  01/14 0701 - 01/15 0700 In: 1084 [P.O.:1080; IV Piggyback:4] Out: 275 [Urine:275]     Physical Exam: BP 101/58  Pulse 81  Temp 98.2 F (36.8 C) (Oral)  Resp 20  Ht 6\' 1"  (1.854 m)  Wt 245 lb 9.5 oz (111.4 kg)  BMI 32.40 kg/m2  SpO2 97%  General: Vital signs reviewed and noted.   Head: Normocephalic, atraumatic.  Eyes: conjunctivae/corneas clear.  EOM's intact.   Throat: normal  Neck:  thick, no JVD  Lungs:   basilar rales  Heart:  RR, no murmur  Abdomen:  Soft, non-tender, non-distended    Extremities: No edema, right radial cath site is OK   Neurologic: A&O X3, CN II - XII are grossly intact.   Psych: Normal     Labs: BMET:  Basename 09/10/12 0610 09/09/12 1707  NA 136 134*  K 3.5 3.9  CL 99 97  CO2 25 24  GLUCOSE 105* 143*  BUN 38* 35*    CREATININE 3.07* 2.44*  CALCIUM 8.8 9.0  MG -- 2.0  PHOS -- --    Liver function tests:  [facility] 09/08/12 1814  AST 20  ALT 15  ALKPHOS 77  BILITOT 0.5  PROT 6.6  ALBUMIN 2.7*    Basename 09/08/12 1814  LIPASE 11  AMYLASE --    CBC:  Basename 09/09/12 0319 09/08/12 1814  WBC 5.4 5.4  NEUTROABS -- 3.4  HGB 10.2* 10.8*  HCT 29.1* 30.1*  MCV 84.6 84.3  PLT 159 169    Cardiac Enzymes:  Basename 09/09/12 0839 09/09/12 0313 09/08/12 2117 09/08/12 1814  CKTOTAL -- -- -- --  CKMB -- -- -- --  TROPONINI 8.44* 9.29* 4.20* 4.04*    Coagulation Studies: No results found for this basename: LABPROT:5,INR:5 in the last 72 hours  Other: No components found with this basename: POCBNP:3 No results found for this basename: DDIMER in the last 72 hours No results found for this basename: HGBA1C in the last 72 hours  Basename 09/09/12 0319  CHOL 229*  HDL 36*  LDLCALC 117*  TRIG 381*  CHOLHDL 6.4   No results found for this basename: TSH,T4TOTAL,FREET3,T3FREE,THYROIDAB  in the last 72 hours No results found for this basename: VITAMINB12,FOLATE,FERRITIN,TIBC,IRON,RETICCTPCT in the last 72 hours   Other results:  EKG - Jan. 14, 2014 - persistent ST elevation in the inferior leads.  Medications:    Infusions:    Scheduled Medications:    . aspirin  81 mg Oral Daily  . benzonatate  100 mg Oral TID  . budesonide-formoterol  2 puff Inhalation BID  . doxazosin  4 mg Oral QHS  . furosemide  40 mg Oral BID  . heparin  5,000 Units Subcutaneous Q8H  . insulin aspart  0-15 Units Subcutaneous TID WC  . insulin glargine  75 Units Subcutaneous QHS  . irbesartan  300 mg Oral Daily  . metoprolol tartrate  25 mg Oral BID  . pantoprazole  40 mg Oral BID AC  . prasugrel  10 mg Oral Daily  . sodium chloride  3 mL Intravenous Q12H    Assessment/ Plan:    1. Acute RCA STEMI:  He is mostly pain free at present.  He has persistent dyspnea and cough.   I have reviewed the  angiograms from last night.   He has slow flow down the PDA for much of the case and there is a question of spasm just proximal to the stent.  These abnormalities appear to have resolved with the final injection.  I have discussed with Dr. Burt Knack - he presented very late and it is not unexpected that he has persistent St elevation.  Will keep in ICU today.  2. Acute renal failure:   His creatinine has increased dramatically since his cath.  I have discussed with Dr. Posey Pronto who will see patient. I have DCd  his  Avapro.    3. Hyperlipidemia:   Lab Results  Component Value Date   CHOL 229* 09/09/2012   HDL 36* 09/09/2012   LDLCALC 117* 09/09/2012   TRIG 381* 09/09/2012   CHOLHDL 6.4 09/09/2012   He is intolerant to statins.  4. PVCs Resolved this am.  5. Acute systolic CHF: Due to his recent MI.  We have not been able to diurese him.  If his creatinine continues to increase, he may need to have dialysis.       Disposition:  Length of Stay: 2  Thayer Headings, Brooke Bonito., MD, [facility] 09/10/2012, 7:45 AM Office [phone] Pager [phone]

## 2012-09-10 NOTE — Consult Note (Signed)
I have personally seen and examined this patient and agree with the assessment/plan as outlined above by [name] MD.(PGY2 resident)  [name] is a 57 year old Caucasian man with past medical history significant for type 2 diabetes mellitus without any associated retinopathy, hypertension and coronary artery disease who was admitted for a 37-day-old non-ST elevation myocardial infarction and underwent PCI to the distal RCA on 09/08/2012. Unfortunately, postprocedure had a rise in creatinine that went from 0.9-1.2 and later 2.4 on 09/09/2012. Urine output has been noted to be declining in creatinine today has risen to 3.1. The patient reports that he has not been accurately collecting his urine although has been asked to do so by the nurses. Until earlier today, he was on Cozaar (hospital substitute for Avapro). Has been challenged with diuretics however without any significant improvement of urine output overnight. Continues to have problems with pedal edema and some shortness of breath that worsens with activity. From a review of the available data base and as suspected by cardiology-he likely has contrast-induced nephropathy versus ATN from relative hypotension while in ARB therapy. Urine output is currently marginal and we would reassess his response to diuretics over the next 24 hours prior to making any decisions regarding hemodialysis for fluid management. From an electrolyte standpoint, no acute dialysis needs are noted. He is not uremic. Renal ultrasound, urinalysis and urine electrolytes have been requested. Do not suspect acute glomerulonephritis given the clinical scenario. Avoid nephrotoxic medications including NSAIDs and elective contrast exposure. Continue strict Input and Output monitoring. Will monitor the patient closely with you and intervene or adjust therapy as indicated by changes in clinical status/labs. Antibiotics will be renally dosed by pharmacy-vancomycin and Zosyn.  [name]  K.,MD 09/10/2012 11:43 AM

## 2012-09-10 NOTE — Consult Note (Signed)
[facility] Consult Note     Date: 09/10/2012                  Patient Name:  Eugene [name]  MRN: [id number]  DOB: 1956/05/19  Age / Sex: 57 y.o., male         PCP: [name], MD                 Service Requesting Consult: [facility]                 Reason for Consult: AKI            Chief Complaint:  Chest Pain  HPI:  57 year old male with history of DM II for 20 years without known retinopathy, HTN, HLD, CAD admitted by cardiology for NSTEMI now s/p distal RCA stent for inferior MI. Renal consulted due to decreasing UOP as well as Creatinine increasing. In discussion with patient, he has been flushing some of his urine and isn't sure if all has been recorded. He agrees to make sure nurse knows every time he urinates so he can avoid foley catheter insertion. He does think he is peeing slightly worse than normal though and endorses increased swelling in his legs.   Patient does report cough for about a week with some chills. Denies fevers.   Contrast history-only with recent catheterization NSAID history-denies except for ASA Ace/arb-on valsartan at home and continued in hospital as irbesartan.  Kidney history-denies history of any kidney issues Hypotension history-relative hypotension with SBP <100 and patient with baseline HTN and unsure of how elevated BP is on regular basis.   Past Medical History  Diagnosis Date  . Arteriosclerotic cardiovascular disease (ASCVD)     a. 05/2011 Cath/PCI: LM nl, LAD 45m, D1 small, D2 small 55m, LCX large 40p, RCA 50-60p, 99 hazy @ origin of PDA with 70-80 in PDA (2.5x26 Resolute Integrity & 3.0x15 Resolute Integrity DES).;  b. 05/2012 Echo: EF 55-60%  . Hyperlipidemia   . Diabetes mellitus     Peripheral neuropathy  . Bell palsy   . Hypertension   . COPD (chronic obstructive pulmonary disease)   . Sleep apnea   . Gallstones   . GERD (gastroesophageal reflux disease) 05/10/2012  . Nephrolithiasis   .  Cholelithiasis 07/2012    Asymptomatic; identified incidentally  . PONV (postoperative nausea and vomiting)   . Anxiety   . Myocardial infarction   . Coronary artery disease   . Dysrhythmia     Past Surgical History  Procedure Date  . Circumcision   . Stents   . Esophagogastroduodenoscopy     in [place] over 20 yrs ago  . Colonoscopy     In [facility], approximately 2011 per patient, was normal. Advised to come back in 10 years.  . Esophagogastroduodenoscopy 06/12/2012    Procedure: ESOPHAGOGASTRODUODENOSCOPY (EGD);  Surgeon: Daneil Dolin, MD;  Location: [facility];  Service: Endoscopy;  Laterality: N/A;  9:45  . Cardiac catheterization     Family History  Problem Relation Age of Onset  . Diabetes Mother   . Heart attack Mother   . Stroke Mother   . Diabetes Sister   . Sleep apnea Sister   . Hypertension Brother   . Diabetes Brother   . Colon cancer Neg Hx   . Liver disease Neg Hx   . Diabetes Brother   . Hypertension Brother    Social History:  reports that he has never  smoked. He does not have any smokeless tobacco history on file. He reports that he does not drink alcohol or use illicit drugs.  Allergies:  Allergies  Allergen Reactions  . Hydrocodone Nausea And Vomiting  . Statins Other (See Comments)    Muscle aches    Medications Prior to Admission  Medication Sig Dispense Refill  . albuterol (PROAIR HFA) 108 (90 BASE) MCG/ACT inhaler Inhale 2 puffs into the lungs every 6 (six) hours as needed. For shortness of breath      . ALPRAZolam (XANAX) 0.5 MG tablet Take 0.5 mg by mouth at bedtime as needed. Sleep aid      . aspirin EC 81 MG tablet Take 81 mg by mouth at bedtime.       . budesonide-formoterol (SYMBICORT) 160-4.5 MCG/ACT inhaler Inhale 2 puffs into the lungs 2 (two) times daily.      [name] doxazosin (CARDURA) 4 MG tablet Take 1 tablet (4 mg total) by mouth at bedtime.  30 tablet  11  . Flaxseed, Linseed, (FLAXSEED OIL) 1000 MG CAPS Take  2,000 mg by mouth daily.       . furosemide (LASIX) 40 MG tablet Take 40 mg twice a day  60 tablet  6  . insulin aspart (NOVOLOG) 100 UNIT/ML injection Inject 3-25 Units into the skin 2 (two) times daily. SLIDING SCALE  NOVOLOG INSULIN BEFORE BREAKFAST AND BEFORE DINNER:   FOR A BLOOD SUGAR OF 70-120 GIVE 0 UNITS.   FOR BLOOD SUGAR OF 121-150 GIVE 3 UNITS.   FOR BLOOD SUGAR OF 151-200 GIVE 4 UNITS.   FOR BLOOD SUGAR 201-250 GIVE 7 UNITS.   FOR BLOOD SUGAR 251-300 GIVE 11 UNITS.   FOR BLOOD SUGAR 301-350 GIVE 15 UNITS.   FOR BLOOD SUGAR 351-400 GIVE 20 UNITS.    FOR BLOOD SUGAR OVER 400 GIVE 25 UNITS AND CALL YOUR DOCTOR.      [name] insulin glargine (LANTUS) 100 UNIT/ML injection Inject 75 Units into the skin at bedtime.      . pantoprazole (PROTONIX) 40 MG tablet Take 1 tablet (40 mg total) by mouth 2 (two) times daily before a meal.  60 tablet  2  . valsartan (DIOVAN) 320 MG tablet Take 320 mg by mouth daily.      . promethazine (PHENERGAN) 25 MG tablet Take 1 tablet (25 mg total) by mouth every 6 (six) hours as needed for nausea.  12 tablet  0    Results for orders placed during the hospital encounter of 09/08/12 (from the past 48 hour(s))  CBC WITH DIFFERENTIAL     Status: Abnormal   Collection Time   09/08/12  6:14 PM      Component Value Range Comment   WBC 5.4  4.0 - 10.5 K/uL    RBC 3.57 (*) 4.22 - 5.81 MIL/uL    Hemoglobin 10.8 (*) 13.0 - 17.0 g/dL    HCT 30.1 (*) 39.0 - 52.0 %    MCV 84.3  78.0 - 100.0 fL    MCH 30.3  26.0 - 34.0 pg    MCHC 35.9  30.0 - 36.0 g/dL    RDW 12.8  11.5 - 15.5 %    Platelets 169  150 - 400 K/uL    Neutrophils Relative 63  43 - 77 %    Neutro Abs 3.4  1.7 - 7.7 K/uL    Lymphocytes Relative 24  12 - 46 %    Lymphs Abs 1.3  0.7 - 4.0 K/uL  Monocytes Relative 11  3 - 12 %    Monocytes Absolute 0.6  0.1 - 1.0 K/uL    Eosinophils Relative 2  0 - 5 %    Eosinophils Absolute 0.1  0.0 - 0.7 K/uL    Basophils Relative 0  0 - 1 %    Basophils Absolute 0.0  0.0 -  0.1 K/uL   COMPREHENSIVE METABOLIC PANEL     Status: Abnormal   Collection Time   09/08/12  6:14 PM      Component Value Range Comment   Sodium 132 (*) 135 - 145 mEq/L    Potassium 4.2  3.5 - 5.1 mEq/L    Chloride 96  96 - 112 mEq/L    CO2 26  19 - 32 mEq/L    Glucose, Bld 352 (*) 70 - 99 mg/dL    BUN 27 (*) 6 - 23 mg/dL    Creatinine, Ser 0.97  0.50 - 1.35 mg/dL    Calcium 9.4  8.4 - 10.5 mg/dL    Total Protein 6.6  6.0 - 8.3 g/dL    Albumin 2.7 (*) 3.5 - 5.2 g/dL    AST 20  0 - 37 U/L    ALT 15  0 - 53 U/L    Alkaline Phosphatase 77  39 - 117 U/L    Total Bilirubin 0.5  0.3 - 1.2 mg/dL    GFR calc non Af Amer >90  >90 mL/min    GFR calc Af Amer >90  >90 mL/min   LIPASE, BLOOD     Status: Normal   Collection Time   09/08/12  6:14 PM      Component Value Range Comment   Lipase 11  11 - 59 U/L   TROPONIN I     Status: Abnormal   Collection Time   09/08/12  6:14 PM      Component Value Range Comment   Troponin I 4.04 (*) <0.30 ng/mL   POCT I-STAT TROPONIN I     Status: Abnormal   Collection Time   09/08/12  6:43 PM      Component Value Range Comment   Troponin i, poc 4.05 (*) 0.00 - 0.08 ng/mL    Comment 3            POCT ACTIVATED CLOTTING TIME     Status: Normal   Collection Time   09/08/12  7:59 PM      Component Value Range Comment   Activated Clotting Time 328     MRSA PCR SCREENING     Status: Normal   Collection Time   09/08/12  9:12 PM      Component Value Range Comment   MRSA by PCR NEGATIVE  NEGATIVE   TROPONIN I     Status: Abnormal   Collection Time   09/08/12  9:17 PM      Component Value Range Comment   Troponin I 4.20 (*) <0.30 ng/mL   TROPONIN I     Status: Abnormal   Collection Time   09/09/12  3:13 AM      Component Value Range Comment   Troponin I 9.29 (*) <0.30 ng/mL   CBC     Status: Abnormal   Collection Time   09/09/12  3:19 AM      Component Value Range Comment   WBC 5.4  4.0 - 10.5 K/uL    RBC 3.44 (*) 4.22 - 5.81 MIL/uL    Hemoglobin 10.2  (*) 13.0 - 17.0  g/dL    HCT 29.1 (*) 39.0 - 52.0 %    MCV 84.6  78.0 - 100.0 fL    MCH 29.7  26.0 - 34.0 pg    MCHC 35.1  30.0 - 36.0 g/dL    RDW 13.1  11.5 - 15.5 %    Platelets 159  150 - 400 K/uL   BASIC METABOLIC PANEL     Status: Abnormal   Collection Time   09/09/12  3:19 AM      Component Value Range Comment   Sodium 134 (*) 135 - 145 mEq/L    Potassium 3.9  3.5 - 5.1 mEq/L    Chloride 99  96 - 112 mEq/L    CO2 25  19 - 32 mEq/L    Glucose, Bld 282 (*) 70 - 99 mg/dL    BUN 29 (*) 6 - 23 mg/dL    Creatinine, Ser 1.28  0.50 - 1.35 mg/dL    Calcium 8.9  8.4 - 10.5 mg/dL    GFR calc non Af Amer 61 (*) >90 mL/min    GFR calc Af Amer 71 (*) >90 mL/min   LIPID PANEL     Status: Abnormal   Collection Time   09/09/12  3:19 AM      Component Value Range Comment   Cholesterol 229 (*) 0 - 200 mg/dL    Triglycerides 381 (*) <150 mg/dL    HDL 36 (*) >39 mg/dL    Total CHOL/HDL Ratio 6.4      VLDL 76 (*) 0 - 40 mg/dL    LDL Cholesterol 117 (*) 0 - 99 mg/dL   TROPONIN I     Status: Abnormal   Collection Time   09/09/12  8:39 AM      Component Value Range Comment   Troponin I 8.44 (*) <0.30 ng/mL   BASIC METABOLIC PANEL     Status: Abnormal   Collection Time   09/09/12  5:07 PM      Component Value Range Comment   Sodium 134 (*) 135 - 145 mEq/L    Potassium 3.9  3.5 - 5.1 mEq/L    Chloride 97  96 - 112 mEq/L    CO2 24  19 - 32 mEq/L    Glucose, Bld 143 (*) 70 - 99 mg/dL    BUN 35 (*) 6 - 23 mg/dL    Creatinine, Ser 2.44 (*) 0.50 - 1.35 mg/dL    Calcium 9.0  8.4 - 10.5 mg/dL    GFR calc non Af Amer 28 (*) >90 mL/min    GFR calc Af Amer 33 (*) >90 mL/min   MAGNESIUM     Status: Normal   Collection Time   09/09/12  5:07 PM      Component Value Range Comment   Magnesium 2.0  1.5 - 2.5 mg/dL   BASIC METABOLIC PANEL     Status: Abnormal   Collection Time   09/10/12  6:10 AM      Component Value Range Comment   Sodium 136  135 - 145 mEq/L    Potassium 3.5  3.5 - 5.1 mEq/L     Chloride 99  96 - 112 mEq/L    CO2 25  19 - 32 mEq/L    Glucose, Bld 105 (*) 70 - 99 mg/dL    BUN 38 (*) 6 - 23 mg/dL    Creatinine, Ser 3.07 (*) 0.50 - 1.35 mg/dL    Calcium 8.8  8.4 - 10.5 mg/dL  GFR calc non Af Amer 21 (*) >90 mL/min    GFR calc Af Amer 25 (*) >90 mL/min    Dg Chest 1 View  09/08/2012  *RADIOLOGY REPORT*  Clinical Data: Chest pain  CHEST - 1 VIEW  Comparison: 06/16/2012  Findings: Low lung volumes.  Bibasilar atelectasis.  Allowing for low volumes, the heart is upper normal in size.  Pulmonary vascularity is within normal limits.  No Kerley B lines to suggest edema.  No pneumothorax.  No acute bony deformity.  IMPRESSION: Bibasilar atelectasis.  No evidence of pulmonary edema.   Original Report Authenticated By: Marybelle Killings, M.D.    Dg Chest Port 1 View  09/09/2012  *RADIOLOGY REPORT*  Clinical Data: Shortness of breath, cough  PORTABLE CHEST - 1 VIEW  Comparison: 09/08/2012  Findings: Cardiomegaly with pulmonary vascular congestion and possible mild interstitial edema. No pleural effusion or pneumothorax.  IMPRESSION: Cardiomegaly with pulmonary vascular congestion and possible mild interstitial edema.   Original Report Authenticated By: Julian Hy, M.D.     [place] HPI  Blood pressure 130/78, pulse 88, temperature 98.3 F (36.8 C), temperature source Oral, resp. rate 16, height 6\' 1"  (1.854 m), weight 245 lb 9.5 oz (111.4 kg), SpO2 100.00%. Gen: NAD, resting comfortably in bed HEENT: NCAT, MMM, PERRLA  CV: RRR no mrg.  Lungs: CTAB  Abd: soft/nontender/nondistended/normal bowel sounds  MSK:  1+ edema  Skin: warm and dry, no rash  Neuro: grossly normal, moves all extremities, alert and oriented x 3  Assessment/Plan 57 year old male with history of DM II for 20 years without known retinopathy, HTN, HLD, CAD admitted by cardiology for NSTEMI now s/p distal RCA stent for inferior MI. Renal consulted for AKI and oliguria.   1. AKI-likely multifactorial with  contrast induced nephropathy primary insult but also with relative hypotension with continued use of ARB (which has now been held). No acute need for HD identified. Will follow renal function daily and electrolytes (renal function panel ordered).   *ordered UA, FeNA, FeUrea, renal ultrasound, postvoid residual  *for oliguria-concern innacurate recording due to flushing urine. Patient to keep all urine and report to RN. Will need foley if cant get accurate i/o.  *agree with continued lasix 40mg  BID to remove fluid.   *agree with holding Ace/ARB and avoiding NSAIDs and other nephrotoxic agents.   Other medical problems per primary team.   Brayton Mars. Melanee Spry, MD, PGY2 09/10/2012 10:46 AM

## 2012-09-10 NOTE — Progress Notes (Signed)
Inpatient Diabetes Program Recommendations  AACE/ADA: New Consensus Statement on Inpatient Glycemic Control (2013)  Target Ranges:  Prepandial:   less than 140 mg/dL      Peak postprandial:   less than 180 mg/dL (1-2 hours)      Critically ill patients:  140 - 180 mg/dL  Results for JAKORY, SPEDDING (MRN [id number]) as of 09/10/2012 11:43  Ref. Range 09/09/2012 07:28 09/09/2012 11:56 09/09/2012 16:48 09/09/2012 21:50 09/10/2012 08:14  Glucose-Capillary Latest Range: 70-99 mg/dL 227 (H) 271 (H) 144 (H) 139 (H) 119 (H)    Inpatient Diabetes Program Recommendations Insulin - Basal: agree with current order Lantus 75 BID Insulin - Meal Coverage: . Thank you  Raoul Pitch BSN, RN,CDE Inpatient Diabetes Coordinator [phone] (team pager)

## 2012-09-10 NOTE — Care Management Note (Signed)
    Page 1 of 1   09/10/2012     12:18:33 PM   CARE MANAGEMENT NOTE 09/10/2012  Patient:  Eugene [name], Eugene [name]   Account Number:  [id number]  Date Initiated:  09/09/2012  Documentation initiated by:  Luz Lex  Subjective/Objective Assessment:   STEMI  Has spouse.     Action/Plan:   Anticipated DC Date:  09/12/2012   Anticipated DC Plan:  [place]  CM consult      Choice offered to / List presented to:             Status of service:  Completed, signed off Medicare Important Message given?   (If response is "NO", the following Medicare IM given date fields will be blank) Date Medicare IM given:   Date Additional Medicare IM given:    Discharge Disposition:    Per UR Regulation:  Reviewed for med. necessity/level of care/duration of stay  If discussed at [place] of Stay Meetings, dates discussed:    Comments:  1/15 1217p debbie Franklin Baumbach rn,bsn spoke w pt and gave pt effient 30day free card and copay assist card.

## 2012-09-11 ENCOUNTER — Ambulatory Visit: Payer: PRIVATE HEALTH INSURANCE | Admitting: Cardiology

## 2012-09-11 LAB — GLUCOSE, CAPILLARY
Glucose-Capillary: 165 mg/dL — ABNORMAL HIGH (ref 70–99)
Glucose-Capillary: 184 mg/dL — ABNORMAL HIGH (ref 70–99)
Glucose-Capillary: 209 mg/dL — ABNORMAL HIGH (ref 70–99)
Glucose-Capillary: 137 mg/dL — ABNORMAL HIGH (ref 70–99)

## 2012-09-11 LAB — RENAL FUNCTION PANEL
CO2: 23 mEq/L (ref 19–32)
Calcium: 8.5 mg/dL (ref 8.4–10.5)
Chloride: 96 mEq/L (ref 96–112)
GFR calc Af Amer: 18 mL/min — ABNORMAL LOW (ref >90)
GFR calc non Af Amer: 15 mL/min — ABNORMAL LOW (ref >90)
Glucose, Bld: 168 mg/dL — ABNORMAL HIGH (ref 70–99)
Potassium: 3.9 mEq/L (ref 3.5–5.1)
Sodium: 133 mEq/L — ABNORMAL LOW (ref 135–145)

## 2012-09-11 MED ORDER — LOPERAMIDE HCL 2 MG PO CAPS
2.0000 mg | ORAL_CAPSULE | ORAL | Status: DC | PRN
  Administered 2012-09-11: 2 mg via ORAL
  Filled 2012-09-11: qty 1

## 2012-09-11 MED ORDER — FUROSEMIDE 10 MG/ML IJ SOLN
40.0000 mg | Freq: Two times a day (BID) | INTRAMUSCULAR | Status: AC
  Administered 2012-09-11 – 2012-09-12 (×3): 40 mg via INTRAVENOUS
  Filled 2012-09-11 (×5): qty 4

## 2012-09-11 NOTE — Progress Notes (Signed)
Pt states has had 4-5 loose stools tonight.  Denies pain or nausea.  Ordered Imodium per previous cardiology prn order.

## 2012-09-11 NOTE — Progress Notes (Signed)
CARDIAC REHAB PHASE I   PRE:  Rate/Rhythm: 84SR  BP:  Supine:   Sitting: 110/62  Standing:    SaO2: 99%RA  MODE:  Ambulation: 200 ft   POST:  Rate/Rhythem: 86SR  BP:  Supine:   Sitting: 117/65  Standing:    SaO2: 99%RA hall and room [phone] Pt walked 200 ft on RA with hand held asst stopping several times to catch his breath. Pt states he does not walk much at home due to neuropathy and cannot get groceries. He sits while wife shops. Checked sats in hall and 99%RA. To sitting on side of bed after walk. Tired but does not appear as dyspneic as yesterday. Had a fleeting Cp across chest but sensation did not stay. Coughed a couple of times but not as severe as when I saw him yesterday. Will continue to see daily and continue ed when closer to d/c.  Jeani Sow

## 2012-09-11 NOTE — Progress Notes (Signed)
PROGRESS NOTE  Subjective:   Eugene [name] is a 57 yo who presented with an Inferior wall RCA STEMI last night.  CP started at 4 PM.  He presented to [facility] ED with a 3 day hx of intermittent CP.  Was brought to [facility] for emergent cath. He was found to have an occluded RCA which was opened by Dr. Burt Knack  ( 2.75 x 38 mm Promus Premier stent dilated with a 3.0 noncompliant balloon).  He has developed acute renal failure after cath.  His urine output has dramatically decreased.  275 cc on 1/14 and 125 cc on 1/15.  He has gained 10 lbs in 3 days.  Objective:    Vital Signs:   Temp:  [98.1 F (36.7 C)-100.8 F (38.2 C)] 98.3 F (36.8 C) (01/16 0627) Pulse Rate:  [80-87] 80  (01/16 0627) Resp:  [17-21] 21  (01/16 0627) BP: (99-125)/(52-76) 99/56 mmHg (01/16 0627) SpO2:  [96 %-100 %] 98 % (01/16 0627) Weight:  [255 lb 15.3 oz (116.1 kg)] 255 lb 15.3 oz (116.1 kg) (01/16 0627)  Last BM Date: 09/10/12    24-hour weight change: Weight change: 10 lb 5.8 oz (4.7 kg)  Weight trends: Filed Weights   09/09/12 0500 09/10/12 0500 09/11/12 0627  Weight: 245 lb 2.4 oz (111.2 kg) 245 lb 9.5 oz (111.4 kg) 255 lb 15.3 oz (116.1 kg)    Intake/Output:  01/15 0701 - 01/16 0700 In: 1612 [P.O.:960; IV Piggyback:652] Out: 125 [Urine:125]     Physical Exam: BP 99/56  Pulse 80  Temp 98.3 F (36.8 C) (Oral)  Resp 21  Ht 6\' 1"  (1.854 m)  Wt 255 lb 15.3 oz (116.1 kg)  BMI 33.77 kg/m2  SpO2 98%  General: Vital signs reviewed and noted.   Head: Normocephalic, atraumatic.  Eyes: conjunctivae/corneas clear.  EOM's intact.   Throat: normal  Neck:  thick, no JVD  Lungs:   few basilar rales, mostly clear  Heart:  RR, no murmur  Abdomen:  Soft, non-tender, non-distended    Extremities: Trace  edema, right radial cath site is OK   Neurologic: A&O X3, CN II - XII are grossly intact.   Psych: Normal     Labs: BMET:  Basename 09/11/12 0610 09/10/12 0610 09/09/12 1707  NA 133* 136 --  K 3.9  3.5 --  CL 96 99 --  CO2 23 25 --  GLUCOSE 168* 105* --  BUN 48* 38* --  CREATININE 4.07* 3.07* --  CALCIUM 8.5 8.8 --  MG -- -- 2.0  PHOS 5.5* -- --    Liver function tests:  [facility] 09/11/12 0610 09/08/12 1814  AST -- 20  ALT -- 15  ALKPHOS -- 77  BILITOT -- 0.5  PROT -- 6.6  ALBUMIN 2.4* 2.7*    Basename 09/08/12 1814  LIPASE 11  AMYLASE --    CBC:  Basename 09/09/12 0319 09/08/12 1814  WBC 5.4 5.4  NEUTROABS -- 3.4  HGB 10.2* 10.8*  HCT 29.1* 30.1*  MCV 84.6 84.3  PLT 159 169    Cardiac Enzymes:  Basename 09/09/12 0839 09/09/12 0313 09/08/12 2117 09/08/12 1814  CKTOTAL -- -- -- --  CKMB -- -- -- --  TROPONINI 8.44* 9.29* 4.20* 4.04*    Coagulation Studies: No results found for this basename: LABPROT:5,INR:5 in the last 72 hours  Other: No components found with this basename: POCBNP:3 No results found for this basename: DDIMER in the last 72 hours No results found for this basename:  HGBA1C in the last 72 hours  Basename 09/09/12 0319  CHOL 229*  HDL 36*  LDLCALC 117*  TRIG 381*  CHOLHDL 6.4   No results found for this basename: TSH,T4TOTAL,FREET3,T3FREE,THYROIDAB in the last 72 hours No results found for this basename: VITAMINB12,FOLATE,FERRITIN,TIBC,IRON,RETICCTPCT in the last 72 hours   Other results:  EKG - Jan. 14, 2014 - persistent ST elevation in the inferior leads.  Medications:    Infusions:    Scheduled Medications:    . aspirin  81 mg Oral Daily  . benzonatate  100 mg Oral TID  . budesonide-formoterol  2 puff Inhalation BID  . doxazosin  4 mg Oral QHS  . furosemide  40 mg Oral BID  . heparin  5,000 Units Subcutaneous Q8H  . insulin aspart  0-15 Units Subcutaneous TID WC  . insulin glargine  75 Units Subcutaneous QHS  . metoprolol tartrate  25 mg Oral BID  . pantoprazole  40 mg Oral BID AC  . piperacillin-tazobactam (ZOSYN)  IV  3.375 g Intravenous Q8H  . prasugrel  10 mg Oral Daily  . sodium chloride  3 mL  Intravenous Q12H  . vancomycin  1,500 mg Intravenous Q48H    Assessment/ Plan:    1. Acute RCA STEMI:  He is mostly pain free at present.  He has persistent dyspnea and cough.  He is pain free.    2. Acute renal failure:   His creatinine has increased dramatically since his cath.  The cause of the acute renal failure is likely multifactorial - diabetes, ARB, IV contrast.  His lungs sound OK but his urine output is poor ( almost aneuric) and his weight is up 10 lbs.  I suspect he will need dialysis at some point.   3. Hyperlipidemia:   Lab Results  Component Value Date   CHOL 229* 09/09/2012   HDL 36* 09/09/2012   LDLCALC 117* 09/09/2012   TRIG 381* 09/09/2012   CHOLHDL 6.4 09/09/2012   He is intolerant to statins.  4. PVCs Resolved this am.  5. Acute systolic CHF: Due to his recent MI.  We have not been able to diurese him.  If his creatinine continues to increase, he may need to have dialysis.     Disposition:  Length of Stay: 3  Thayer Headings, Brooke Bonito., MD, [facility] 09/11/2012, 8:00 AM Office [phone] Pager [phone]

## 2012-09-11 NOTE — Progress Notes (Signed)
Patient ID: Eugene [name], male   DOB: 22-Aug-1956, 57 y.o.   MRN: [id number]   [facility] Progress Note    Subjective:   Reports to be feeling rough- dyspnea not worse overnight   Objective:   BP 115/75  Pulse 85  Temp 98 F (36.7 C) (Oral)  Resp 20  Ht 6\' 1"  (1.854 m)  Wt 116.1 kg (255 lb 15.3 oz)  BMI 33.77 kg/m2  SpO2 98%  Intake/Output Summary (Last 24 hours) at 09/11/12 [id number] Last data filed at 09/11/12 0600  Gross per 24 hour  Intake   1292 ml  Output    125 ml  Net   1167 ml   Weight change: 4.7 kg (10 lb 5.8 oz)  Physical Exam: Gen: Comfortable on side of his bed- no asterixis CVS: Pulse RRR, normal S1 and S2 Resp: Fine rales, normal S1 and S2  Abd: Soft, obese, NT, BS normal Ext: 2+ LE edema  Imaging: US Renal  09/10/2012  *RADIOLOGY REPORT*  Clinical Data: Acute renal insufficiency.  RENAL/URINARY TRACT ULTRASOUND COMPLETE  Comparison:  Ultrasound dated 08/22/2012  Findings:  Right Kidney:  15.4 cm in length.  Small amount of perinephric fluid adjacent to the lower pole.  Increased risk echogenicity of the renal parenchyma since the prior exam.  Left Kidney:  16.8 cm in length. Echogenic renal parenchyma, increased since the prior study.  Bladder:  Normal.  IMPRESSION: Increased echogenicity of the renal parenchyma bilaterally, new since the prior exam, consistent with renal medical disease.  New small amount of perinephric fluid around the right kidney.  No obstruction.   Original Report Authenticated By: Lorriane Shire, M.D.    Dg Chest Port 1 View  09/09/2012  *RADIOLOGY REPORT*  Clinical Data: Shortness of breath, cough  PORTABLE CHEST - 1 VIEW  Comparison: 09/08/2012  Findings: Cardiomegaly with pulmonary vascular congestion and possible mild interstitial edema. No pleural effusion or pneumothorax.  IMPRESSION: Cardiomegaly with pulmonary vascular congestion and possible mild interstitial edema.   Original Report Authenticated By: Julian Hy, M.D.     Labs: BMET  Lab 09/11/12 [id number] 09/10/12 0610 09/09/12 1707 09/09/12 0319 09/08/12 1814  NA 133* 136 134* 134* 132*  K 3.9 3.5 3.9 3.9 4.2  CL 96 99 97 99 96  CO2 23 25 24 25 26   GLUCOSE 168* 105* 143* 282* 352*  BUN 48* 38* 35* 29* 27*  CREATININE 4.07* 3.07* 2.44* 1.28 0.97  ALB -- -- -- -- --  CALCIUM 8.5 8.8 9.0 8.9 9.4  PHOS 5.5* -- -- -- --   CBC  Lab 09/09/12 0319 09/08/12 1814  WBC 5.4 5.4  NEUTROABS -- 3.4  HGB 10.2* 10.8*  HCT 29.1* 30.1*  MCV 84.6 84.3  PLT 159 169    Medications:      . aspirin  81 mg Oral Daily  . benzonatate  100 mg Oral TID  . budesonide-formoterol  2 puff Inhalation BID  . doxazosin  4 mg Oral QHS  . furosemide  40 mg Oral BID  . heparin  5,000 Units Subcutaneous Q8H  . insulin aspart  0-15 Units Subcutaneous TID WC  . insulin glargine  75 Units Subcutaneous QHS  . metoprolol tartrate  25 mg Oral BID  . pantoprazole  40 mg Oral BID AC  . piperacillin-tazobactam (ZOSYN)  IV  3.375 g Intravenous Q8H  . prasugrel  10 mg Oral Daily  . sodium chloride  3 mL Intravenous Q12H  . vancomycin  1,500 mg Intravenous Q48H     Assessment/ Plan:   1. AKI: suspected CINP in a patient with underlying DM/ARB use. Remains oliguric on oral lasix- given volume status and impaired renal function, will switch to IV lasix and place foley catheter for strict output monitoring. Does not appear to have any acute HD needs at present but is high risk for needing this in the near future 2. Hyponatremia: due to ARF and impaired free water handling. Will restrict PO fluids to 1.2L/day 3. ACS/NSTEMI: s/p PCI and clinically doing better 4. Possible HCAP: on coverage with Vancomycin and zosyn  Elmarie Shiley, MD 09/11/2012, 8:32 AM

## 2012-09-12 DIAGNOSIS — A0472 Enterocolitis due to Clostridium difficile, not specified as recurrent: Secondary | ICD-10-CM

## 2012-09-12 LAB — RENAL FUNCTION PANEL
Albumin: 2.8 g/dL — ABNORMAL LOW (ref 3.5–5.2)
BUN: 51 mg/dL — ABNORMAL HIGH (ref 6–23)
Chloride: 98 mEq/L (ref 96–112)
GFR calc non Af Amer: 21 mL/min — ABNORMAL LOW (ref >90)
Phosphorus: 4.2 mg/dL (ref 2.3–4.6)
Potassium: 3.9 mEq/L (ref 3.5–5.1)
Sodium: 134 mEq/L — ABNORMAL LOW (ref 135–145)

## 2012-09-12 LAB — GLUCOSE, CAPILLARY
Glucose-Capillary: 108 mg/dL — ABNORMAL HIGH (ref 70–99)
Glucose-Capillary: 146 mg/dL — ABNORMAL HIGH (ref 70–99)
Glucose-Capillary: 98 mg/dL (ref 70–99)

## 2012-09-12 LAB — CLOSTRIDIUM DIFFICILE BY PCR: Toxigenic C. Difficile by PCR: POSITIVE — AB

## 2012-09-12 MED ORDER — FUROSEMIDE 10 MG/ML IJ SOLN
40.0000 mg | Freq: Two times a day (BID) | INTRAMUSCULAR | Status: DC
  Administered 2012-09-12 – 2012-09-13 (×2): 40 mg via INTRAVENOUS
  Filled 2012-09-12 (×3): qty 4

## 2012-09-12 MED ORDER — METRONIDAZOLE 500 MG PO TABS
500.0000 mg | ORAL_TABLET | Freq: Three times a day (TID) | ORAL | Status: DC
  Administered 2012-09-12 – 2012-09-16 (×13): 500 mg via ORAL
  Filled 2012-09-12 (×15): qty 1

## 2012-09-12 NOTE — Progress Notes (Signed)
Utilization Review Completed Joshoa Shawler J. Artist Bloom, RN, BSN, NCM [phone]  

## 2012-09-12 NOTE — Progress Notes (Signed)
Notified D.Dunn PA notified positive C-diff results patient already placed on contact precaution.

## 2012-09-12 NOTE — Progress Notes (Signed)
PROGRESS NOTE  Subjective:   Eugene [name] is a 57 yo who presented with an Inferior wall RCA STEMI last night.  CP started at 4 PM.  He presented to [facility] ED with a 3 day hx of intermittent CP.  Was brought to [facility] [place] for emergent cath. He was found to have an occluded RCA which was opened by Dr. Burt Knack  ( 2.75 x 38 mm Promus Premier stent dilated with a 3.0 noncompliant balloon).  His UOP has picked up some.  Creatinine is down some.  Objective:    Vital Signs:   Temp:  [98 F (36.7 C)-99.5 F (37.5 C)] 99.5 F (37.5 C) (01/17 0515) Pulse Rate:  [81-88] 87  (01/17 0515) Resp:  [15-20] 17  (01/17 0515) BP: (110-139)/(63-92) 117/69 mmHg (01/17 0515) SpO2:  [95 %-98 %] 95 % (01/17 0515) Weight:  [248 lb 8 oz (112.719 kg)-250 lb 10.6 oz (113.7 kg)] 248 lb 8 oz (112.719 kg) (01/17 0515)  Last BM Date: 09/11/12    24-hour weight change: Weight change: -5 lb 4.7 oz (-2.4 kg)  Weight trends: Filed Weights   09/11/12 0627 09/12/12 0025 09/12/12 0515  Weight: 255 lb 15.3 oz (116.1 kg) 250 lb 10.6 oz (113.7 kg) 248 lb 8 oz (112.719 kg)    Intake/Output:  01/16 0701 - 01/17 0700 In: 828 [P.O.:720; IV Piggyback:108] Out: [id number] [Urine:1425; Stool:4]     Physical Exam: BP 117/69  Pulse 87  Temp 99.5 F (37.5 C) (Oral)  Resp 17  Ht 6\' 1"  (1.854 m)  Wt 248 lb 8 oz (112.719 kg)  BMI 32.79 kg/m2  SpO2 95%  General: Vital signs reviewed and noted.   Head: Normocephalic, atraumatic.  Eyes: conjunctivae/corneas clear.  EOM's intact.   Throat: normal  Neck:  thick, no JVD  Lungs:   fairly clear  Heart:  RR, no murmur  Abdomen:  Soft, non-tender, non-distended    Extremities: Trace edema, right radial cath site is OK   Neurologic: A&O X3, CN II - XII are grossly intact.   Psych: Normal     Labs: BMET:  Basename 09/12/12 0537 09/11/12 0610 09/09/12 1707  NA 134* 133* --  K 3.9 3.9 --  CL 98 96 --  CO2 22 23 --  GLUCOSE 144* 168* --  BUN 51* 48* --  CREATININE 3.11*  4.07* --  CALCIUM 8.7 8.5 --  MG -- -- 2.0  PHOS 4.2 5.5* --    Liver function tests:  Basename 09/12/12 0537 09/11/12 0610  AST -- --  ALT -- --  ALKPHOS -- --  BILITOT -- --  PROT -- --  ALBUMIN 2.8* 2.4*   No results found for this basename: LIPASE:2,AMYLASE:2 in the last 72 hours  CBC: No results found for this basename: WBC:2,NEUTROABS:2,HGB:2,HCT:2,MCV:2,PLT:2 in the last 72 hours  Cardiac Enzymes:  Basename 09/09/12 0839  CKTOTAL --  CKMB --  TROPONINI 8.44*    Coagulation Studies: No results found for this basename: LABPROT:5,INR:5 in the last 72 hours  Other: No components found with this basename: POCBNP:3 No results found for this basename: DDIMER in the last 72 hours No results found for this basename: HGBA1C in the last 72 hours No results found for this basename: CHOL,HDL,LDLCALC,TRIG,CHOLHDL in the last 72 hours No results found for this basename: TSH,T4TOTAL,FREET3,T3FREE,THYROIDAB in the last 72 hours No results found for this basename: VITAMINB12,FOLATE,FERRITIN,TIBC,IRON,RETICCTPCT in the last 72 hours   Other results:    Medications:    Infusions:  Scheduled Medications:    . aspirin  81 mg Oral Daily  . benzonatate  100 mg Oral TID  . budesonide-formoterol  2 puff Inhalation BID  . doxazosin  4 mg Oral QHS  . furosemide  40 mg Intravenous BID  . heparin  5,000 Units Subcutaneous Q8H  . insulin aspart  0-15 Units Subcutaneous TID WC  . insulin glargine  75 Units Subcutaneous QHS  . metoprolol tartrate  25 mg Oral BID  . pantoprazole  40 mg Oral BID AC  . piperacillin-tazobactam (ZOSYN)  IV  3.375 g Intravenous Q8H  . prasugrel  10 mg Oral Daily  . sodium chloride  3 mL Intravenous Q12H  . vancomycin  1,500 mg Intravenous Q48H    Assessment/ Plan:    1. Acute RCA STEMI:  He is mostly pain free at present.  He has persistent dyspnea and cough.  He is pain free.    2. Acute renal failure:   His creatinine has increased  dramatically since his cath.  The cause of the acute renal failure is likely multifactorial - diabetes, ARB, IV contrast.  His lungs sound OK - his UOP has increased some. Creatinine is trending down now.  3. Hyperlipidemia:   Lab Results  Component Value Date   CHOL 229* 09/09/2012   HDL 36* 09/09/2012   LDLCALC 117* 09/09/2012   TRIG 381* 09/09/2012   CHOLHDL 6.4 09/09/2012   He is intolerant to statins.  4. PVCs Resolved this am.  5. Acute systolic CHF: Will not be able to use ARB or ACE inhibitor at this point given renal insufficiency. Continue metoprolol  Disposition:  Length of Stay: 4  Thayer Headings, Brooke Bonito., MD, [facility] 09/12/2012, 7:38 AM   Office [phone] Pager [phone]

## 2012-09-12 NOTE — Consult Note (Signed)
[facility] Medical Consultation  Haskell Markuson Kaine [id number] DOB: 06/18/1956 DOA: 09/08/2012 PCP: [name], MD   Requesting physician: cardiology Reason for consultation: c diff  Impression/Recommendations Active Problems:  C. difficile colitis    1. c diff colitis: flagyl PO, contact precautions, avoid imodium, stop IV abx (no sign of PNA on chest x ray) fever on 1/15 could have been from c diff- check CBC in AM- not currently on O2 2. DM- SSI, lantus (BS 108-209) 3. AKI- nephrology following 4. pulm edema- per primary 5. S/p cath for inferior STEMI  [facility] will followup again tomorrow. Please contact me if I can be of assistance in the meanwhile. Thank you for this consultation.  Chief Complaint: diarrhea  HPI:  Patient states he had diarrhea for about 1 week before he came into the hospital.  He presented here with 3 day h/o intermittent chest pain and dyspnea and has been found to have an inferior STEMI. No recent abx usage No prior hx of c diff No recent hospitalization No sick contacts No abdominal pain No blood in stool Fever x 1 +cough- patient states he had this before he came in   Review of Systems:  All systems reviewed, negative unless stated above  Past Medical History  Diagnosis Date  . Arteriosclerotic cardiovascular disease (ASCVD)     a. 05/2011 Cath/PCI: LM nl, LAD 45m, D1 small, D2 small 75m, LCX large 40p, RCA 50-60p, 99 hazy @ origin of PDA with 70-80 in PDA (2.5x26 Resolute Integrity & 3.0x15 Resolute Integrity DES).;  b. 05/2012 Echo: EF 55-60%  . Hyperlipidemia   . Diabetes mellitus     Peripheral neuropathy  . Bell palsy   . Hypertension   . COPD (chronic obstructive pulmonary disease)   . Sleep apnea   . Gallstones   . GERD (gastroesophageal reflux disease) 05/10/2012  . Nephrolithiasis   . Cholelithiasis 07/2012    Asymptomatic; identified incidentally  . PONV (postoperative nausea and vomiting)   . Anxiety    . Myocardial infarction   . Coronary artery disease   . Dysrhythmia    Past Surgical History  Procedure Date  . Circumcision   . Stents   . Esophagogastroduodenoscopy     in [place] over 20 yrs ago  . Colonoscopy     In [facility], approximately 2011 per patient, was normal. Advised to come back in 10 years.  . Esophagogastroduodenoscopy 06/12/2012    Procedure: ESOPHAGOGASTRODUODENOSCOPY (EGD);  Surgeon: Daneil Dolin, MD;  Location: [facility];  Service: Endoscopy;  Laterality: N/A;  9:45  . Cardiac catheterization    Social History:  reports that he has never smoked. He does not have any smokeless tobacco history on file. He reports that he does not drink alcohol or use illicit drugs.  Allergies  Allergen Reactions  . Hydrocodone Nausea And Vomiting  . Statins Other (See Comments)    Muscle aches   Family History  Problem Relation Age of Onset  . Diabetes Mother   . Heart attack Mother   . Stroke Mother   . Diabetes Sister   . Sleep apnea Sister   . Hypertension Brother   . Diabetes Brother   . Colon cancer Neg Hx   . Liver disease Neg Hx   . Diabetes Brother   . Hypertension Brother     Prior to Admission medications   Medication Sig Start Date End Date Taking? Authorizing Provider  albuterol (PROAIR HFA) 108 (90 BASE) MCG/ACT inhaler  Inhale 2 puffs into the lungs every 6 (six) hours as needed. For shortness of breath   Yes Historical Provider, MD  ALPRAZolam Duanne Moron) 0.5 MG tablet Take 0.5 mg by mouth at bedtime as needed. Sleep aid   Yes Historical Provider, MD  aspirin EC 81 MG tablet Take 81 mg by mouth at bedtime.    Yes Historical Provider, MD  budesonide-formoterol (SYMBICORT) 160-4.5 MCG/ACT inhaler Inhale 2 puffs into the lungs 2 (two) times daily.   Yes Historical Provider, MD  doxazosin (CARDURA) 4 MG tablet Take 1 tablet (4 mg total) by mouth at bedtime. 08/18/12  Yes Yehuda Savannah, MD  Flaxseed, Linseed, (FLAXSEED OIL) 1000 MG  CAPS Take 2,000 mg by mouth daily.    Yes Historical Provider, MD  furosemide (LASIX) 40 MG tablet Take 40 mg twice a day 08/01/12  Yes Yehuda Savannah, MD  insulin aspart (NOVOLOG) 100 UNIT/ML injection Inject 3-25 Units into the skin 2 (two) times daily. SLIDING SCALE  NOVOLOG INSULIN BEFORE BREAKFAST AND BEFORE DINNER:   FOR A BLOOD SUGAR OF 70-120 GIVE 0 UNITS.   FOR BLOOD SUGAR OF 121-150 GIVE 3 UNITS.   FOR BLOOD SUGAR OF 151-200 GIVE 4 UNITS.   FOR BLOOD SUGAR 201-250 GIVE 7 UNITS.   FOR BLOOD SUGAR 251-300 GIVE 11 UNITS.   FOR BLOOD SUGAR 301-350 GIVE 15 UNITS.   FOR BLOOD SUGAR 351-400 GIVE 20 UNITS.    FOR BLOOD SUGAR OVER 400 GIVE 25 UNITS AND CALL YOUR DOCTOR. 05/11/12  Yes Rexene Alberts, MD  insulin glargine (LANTUS) 100 UNIT/ML injection Inject 75 Units into the skin at bedtime. 05/11/12 05/11/13 Yes Rexene Alberts, MD  pantoprazole (PROTONIX) 40 MG tablet Take 1 tablet (40 mg total) by mouth 2 (two) times daily before a meal. 08/07/12 08/07/13 Yes Mahala Menghini, PA  valsartan (DIOVAN) 320 MG tablet Take 320 mg by mouth daily. 06/03/12  Yes [name], MD  promethazine (PHENERGAN) 25 MG tablet Take 1 tablet (25 mg total) by mouth every 6 (six) hours as needed for nausea. 06/01/12   [name], MD   Physical Exam: Blood pressure 130/77, pulse 93, temperature 99.3 F (37.4 C), temperature source Oral, resp. rate 19, height 6\' 1"  (1.854 m), weight 112.719 kg (248 lb 8 oz), SpO2 94.00%. Filed Vitals:   09/12/12 0515 09/12/12 0806 09/12/12 1159 09/12/12 1200  BP: 117/69 122/65  130/77  Pulse: 87 84 93   Temp: 99.5 F (37.5 C) 99.3 F (37.4 C) 99.3 F (37.4 C)   TempSrc: Oral Oral Oral   Resp: 17   19  Height:      Weight: 112.719 kg (248 lb 8 oz)     SpO2: 95% 94% 94%      General:  A+Ox3, NAD  Eyes: wnl  ENT: wnl, not dry  Neck: supple  Cardiovascular: rrr  Respiratory: clear anterior  Abdomen: +BS, no tender, obese  Skin: no rashes or lesions, + edema in LE  mild  Musculoskeletal: moves all 4 extremitites  Psychiatric: no SI/no HI  Neurologic: CN 2-12 intact  Labs on Admission:  Basic Metabolic Panel:  Lab [id number] 09/11/12 0610 09/10/12 0610 09/09/12 1707 09/09/12 0319  NA 134* 133* 136 134* 134*  K 3.9 3.9 3.5 3.9 3.9  CL 98 96 99 97 99  CO2 22 23 25 24 25   GLUCOSE 144* 168* 105* 143* 282*  BUN 51* 48* 38* 35* 29*  CREATININE 3.11* 4.07* 3.07* 2.44* 1.28  CALCIUM 8.7 8.5 8.8 9.0 8.9  MG -- -- -- 2.0 --  PHOS 4.2 5.5* -- -- --   Liver Function Tests:  Lab 09/12/12 0537 09/11/12 0610 09/08/12 1814  AST -- -- 20  ALT -- -- 15  ALKPHOS -- -- 77  BILITOT -- -- 0.5  PROT -- -- 6.6  ALBUMIN 2.8* 2.4* 2.7*    Lab 09/08/12 1814  LIPASE 11  AMYLASE --   No results found for this basename: AMMONIA:5 in the last 168 hours CBC:  Lab 09/09/12 0319 09/08/12 1814  WBC 5.4 5.4  NEUTROABS -- 3.4  HGB 10.2* 10.8*  HCT 29.1* 30.1*  MCV 84.6 84.3  PLT 159 169   Cardiac Enzymes:  Lab 09/09/12 0839 09/09/12 0313 09/08/12 2117 09/08/12 1814  CKTOTAL -- -- -- --  CKMB -- -- -- --  CKMBINDEX -- -- -- --  TROPONINI 8.44* 9.29* 4.20* 4.04*   BNP: No components found with this basename: POCBNP:5 CBG:  Lab 09/12/12 1157 09/12/12 0811 09/11/12 2129 09/11/12 1758 09/11/12 1252  GLUCAP 108* 122* 137* 209* 184*    Radiological Exams on Admission: No results found.    Time spent: 70 min  Eliseo Squires Lorella Gomez [facility] Pager [phone]  If 7PM-7AM, please contact night-coverage [email or web address] Password [facility] 09/12/2012, 3:22 PM

## 2012-09-12 NOTE — Progress Notes (Addendum)
Called regarding patient's diarrhea. Abx were started on 09/10/12, low grade temp 100.8 on that day. Last CXR without clear PNA. Temp ~99 range now. Per discussion with MD, will go ahead and discontinue antibiotics, trend temp, check C-diff and place on enteric precautions. If diarrhea continues, will consider medicine consult. Ahnika Hannibal PA-C  ADDENDUM: C diff +. D/w Dr. Acie Fredrickson. Given comorbidities, will ask IM to consult for antibiotic assistance given that he also received vancomycin 09/10/12. Soloman Mckeithan PA-C

## 2012-09-12 NOTE — Progress Notes (Signed)
Patient ID: Eugene [name], male   DOB: 29-May-1956, 57 y.o.   MRN: [id number]   [facility] Progress Note    Subjective:   Reports to be feeling rough- dyspnea not improved and minimally worsened.  Reports feeling cold in spite of warm room and blankets.  Significant orthopnea overnight required sleeping in chair due to respiratory discomfort   Objective:   BP 117/69  Pulse 87  Temp 99.5 F (37.5 C) (Oral)  Resp 17  Ht 6\' 1"  (1.854 m)  Wt 248 lb 8 oz (112.719 kg)  BMI 32.79 kg/m2  SpO2 95%  Intake/Output Summary (Last 24 hours) at 09/12/12 0741 Last data filed at 09/12/12 0700  Gross per 24 hour  Intake    828 ml  Output   1429 ml  Net   -601 ml   Weight change: -5 lb 4.7 oz (-2.4 kg)  Physical Exam: Gen: Comfortable on side of his bed- no asterixis CVS: Pulse RRR, normal S1 and S2 Resp: Fine crackles, no wheezes Abd: Soft, obese, NT, BS normal Ext: 2+ LE edema  Imaging: US Renal  09/10/2012  *RADIOLOGY REPORT*  Clinical Data: Acute renal insufficiency.  RENAL/URINARY TRACT ULTRASOUND COMPLETE  Comparison:  Ultrasound dated 08/22/2012  Findings:  Right Kidney:  15.4 cm in length.  Small amount of perinephric fluid adjacent to the lower pole.  Increased risk echogenicity of the renal parenchyma since the prior exam.  Left Kidney:  16.8 cm in length. Echogenic renal parenchyma, increased since the prior study.  Bladder:  Normal.  IMPRESSION: Increased echogenicity of the renal parenchyma bilaterally, new since the prior exam, consistent with renal medical disease.  New small amount of perinephric fluid around the right kidney.  No obstruction.   Original Report Authenticated By: Lorriane Shire, M.D.     Labs: BMET  Lab 09/12/12 [id number] 09/11/12 0610 09/10/12 0610 09/09/12 1707 09/09/12 0319 09/08/12 1814  NA 134* 133* 136 134* 134* 132*  K 3.9 3.9 3.5 3.9 3.9 4.2  CL 98 96 99 97 99 96  CO2 22 23 25 24 25 26   GLUCOSE 144* 168* 105* 143* 282* 352*  BUN 51*  48* 38* 35* 29* 27*  CREATININE 3.11* 4.07* 3.07* 2.44* 1.28 0.97  ALB -- -- -- -- -- --  CALCIUM 8.7 8.5 8.8 9.0 8.9 9.4  PHOS 4.2 5.5* -- -- -- --   CBC  Lab 09/09/12 0319 09/08/12 1814  WBC 5.4 5.4  NEUTROABS -- 3.4  HGB 10.2* 10.8*  HCT 29.1* 30.1*  MCV 84.6 84.3  PLT 159 169    Medications:       . aspirin  81 mg Oral Daily  . benzonatate  100 mg Oral TID  . budesonide-formoterol  2 puff Inhalation BID  . doxazosin  4 mg Oral QHS  . furosemide  40 mg Intravenous BID  . heparin  5,000 Units Subcutaneous Q8H  . insulin aspart  0-15 Units Subcutaneous TID WC  . insulin glargine  75 Units Subcutaneous QHS  . metoprolol tartrate  25 mg Oral BID  . pantoprazole  40 mg Oral BID AC  . piperacillin-tazobactam (ZOSYN)  IV  3.375 g Intravenous Q8H  . prasugrel  10 mg Oral Daily  . sodium chloride  3 mL Intravenous Q12H  . vancomycin  1,500 mg Intravenous Q48H     Assessment/ Plan:   1. AKI: suspected CINP in a patient with underlying DM/ARB use. [name] UOP and Cr improved overnight with IV lasix.  Will continue current dosing and monitor respiratory status closely.  Currently not appropriate for acute HD however if continued significant worsening of respiratory status/volume overload will consider. 2. Hyponatremia: due to ARF and impaired free water handling. Will restrict PO fluids to 1.2L/day 3. ACS/NSTEMI: s/p PCI and clinically doing better 4. Possible HCAP: on coverage with Vancomycin and zosyn  I have personally seen and examined this patient and agree with the assessment/plan as outlined above by Paulla Fore DO (PGY2).  Renal function improving- will continue IV lasix for diuresis and monitor- no acute HD needs noted.   [name] K.,MD 09/12/2012 9:40 AM

## 2012-09-13 DIAGNOSIS — A0472 Enterocolitis due to Clostridium difficile, not specified as recurrent: Secondary | ICD-10-CM

## 2012-09-13 LAB — RENAL FUNCTION PANEL
CO2: 22 mEq/L (ref 19–32)
Calcium: 8.9 mg/dL (ref 8.4–10.5)
GFR calc Af Amer: 42 mL/min — ABNORMAL LOW (ref >90)
GFR calc non Af Amer: 36 mL/min — ABNORMAL LOW (ref >90)
Phosphorus: 3.8 mg/dL (ref 2.3–4.6)
Sodium: 136 mEq/L (ref 135–145)

## 2012-09-13 LAB — IRON AND TIBC
Iron: 21 ug/dL — ABNORMAL LOW (ref 42–135)
TIBC: 166 ug/dL — ABNORMAL LOW (ref 215–435)
UIBC: 145 ug/dL (ref 125–400)

## 2012-09-13 LAB — CBC
MCHC: 34.2 g/dL (ref 30.0–36.0)
Platelets: 226 10*3/uL (ref 150–400)
RDW: 12.8 % (ref 11.5–15.5)

## 2012-09-13 LAB — GLUCOSE, CAPILLARY
Glucose-Capillary: 128 mg/dL — ABNORMAL HIGH (ref 70–99)
Glucose-Capillary: 171 mg/dL — ABNORMAL HIGH (ref 70–99)
Glucose-Capillary: 129 mg/dL — ABNORMAL HIGH (ref 70–99)

## 2012-09-13 LAB — FERRITIN: Ferritin: 448 ng/mL — ABNORMAL HIGH (ref 22–322)

## 2012-09-13 MED ORDER — FUROSEMIDE 10 MG/ML IJ SOLN
40.0000 mg | Freq: Every day | INTRAMUSCULAR | Status: DC
  Administered 2012-09-14 – 2012-09-16 (×2): 40 mg via INTRAVENOUS
  Filled 2012-09-13 (×3): qty 4

## 2012-09-13 NOTE — Progress Notes (Signed)
PROGRESS NOTE  Subjective:   Eugene [name] is a 57 yo who presented with an Inferior wall RCA STEMI.  CP started at 4 PM. He was found to have an occluded RCA which was opened by Dr. Burt Knack  ( 2.75 x 38 mm Promus Premier stent dilated with a 3.0 noncompliant balloon). Denies CP or dyspnea; diarrhea improving  Objective:    Vital Signs:   Temp:  [99.1 F (37.3 C)-100.4 F (38 C)] 100.4 F (38 C) (01/18 0443) Pulse Rate:  [86-94] 86  (01/18 0443) Resp:  [18-20] 19  (01/18 0443) BP: (118-144)/(55-77) 118/55 mmHg (01/18 0443) SpO2:  [93 %-96 %] 94 % (01/18 0443) Weight:  [245 lb 12.8 oz (111.494 kg)] 245 lb 12.8 oz (111.494 kg) (01/18 0443)  Last BM Date: 09/11/12    24-hour weight change: Weight change: -4 lb 13.8 oz (-2.206 kg)  Weight trends: Filed Weights   09/12/12 0025 09/12/12 0515 09/13/12 0443  Weight: 250 lb 10.6 oz (113.7 kg) 248 lb 8 oz (112.719 kg) 245 lb 12.8 oz (111.494 kg)    Intake/Output:  01/17 0701 - 01/18 0700 In: 243 [P.O.:240; I.V.:3] Out: 900 [Urine:900]     Physical Exam: BP 118/55  Pulse 86  Temp 100.4 F (38 C) (Oral)  Resp 19  Ht 6\' 1"  (1.854 m)  Wt 245 lb 12.8 oz (111.494 kg)  BMI 32.43 kg/m2  SpO2 94%  General: Vital signs reviewed and noted. WD/WN/NAD           Neck:  supple  Lungs:   CTA  Heart:  RRR, no murmur  Abdomen:  Soft, non-tender, non-distended    Extremities: 1+ edema  Neurologic: A&O X3, CN II - XII are grossly intact.        Labs: BMET:  Basename 09/13/12 0625 09/12/12 0537  NA 136 134*  K 3.8 3.9  CL 98 98  CO2 22 22  GLUCOSE 83 144*  BUN 45* 51*  CREATININE 1.97* 3.11*  CALCIUM 8.9 8.7  MG -- --  PHOS 3.8 4.2    Liver function tests:  Basename 09/13/12 0625 09/12/12 0537  AST -- --  ALT -- --  ALKPHOS -- --  BILITOT -- --  PROT -- --  ALBUMIN 2.6* 2.8*    CBC:  Basename 09/13/12 0625  WBC 3.7*  NEUTROABS --  HGB 9.3*  HCT 27.2*  MCV 85.8  PLT 226      Other  results:    Medications:    Infusions:    Scheduled Medications:    . aspirin  81 mg Oral Daily  . benzonatate  100 mg Oral TID  . budesonide-formoterol  2 puff Inhalation BID  . doxazosin  4 mg Oral QHS  . furosemide  40 mg Intravenous BID  . heparin  5,000 Units Subcutaneous Q8H  . insulin aspart  0-15 Units Subcutaneous TID WC  . insulin glargine  75 Units Subcutaneous QHS  . metoprolol tartrate  25 mg Oral BID  . metroNIDAZOLE  500 mg Oral Q8H  . pantoprazole  40 mg Oral BID AC  . prasugrel  10 mg Oral Daily  . sodium chloride  3 mL Intravenous Q12H    Assessment/ Plan:    1. Acute RCA STEMI:  Continue ASA, effient,  and metoprolol.  2. Acute renal failure:  The cause of the acute renal failure is likely multifactorial - diabetes, ARB, IV contrast. Creatinine is improving. Continue present dose of lasix and follow renal function.  3. Hyperlipidemia:   Lab Results  Component Value Date   CHOL 229* 09/09/2012   HDL 36* 09/09/2012   LDLCALC 117* 09/09/2012   TRIG 381* 09/09/2012   CHOLHDL 6.4 09/09/2012   He is intolerant to statins.  4. PVCs Resolved  5. Acute systolic CHF: Will not be able to use ARB or ACE inhibitor at this point given renal insufficiency. Continue present dose of lasix.  6. C diff colitis Continue flagyl.  Disposition:  Length of Stay: [place], MD, [facility] 09/13/2012, 8:31 AM

## 2012-09-13 NOTE — Progress Notes (Signed)
Patient ID: Eugene [name], male   DOB: Jan 07, 1956, 57 y.o.   MRN: [id number]   [facility] Progress Note    Subjective:   Reports feeling significantly improved overnight.  Still frequent BM, not collecting urine with BMs   Objective:   BP 118/55  Pulse 86  Temp 100.4 F (38 C) (Oral)  Resp 19  Ht 6\' 1"  (1.854 m)  Wt 245 lb 12.8 oz (111.494 kg)  BMI 32.43 kg/m2  SpO2 94%  Intake/Output Summary (Last 24 hours) at 09/13/12 0848 Last data filed at 09/12/12 2131  Gross per 24 hour  Intake    243 ml  Output    600 ml  Net   -357 ml   Weight change: -4 lb 13.8 oz (-2.206 kg)  Physical Exam: Gen: Comfortable on side of his bed- no asterixis CVS: Pulse RRR, normal S1 and S2 Resp: Coarse crackles in L lung base, clear on R Abd: Soft, obese, NT, BS normal Ext: 2+ LE edema  Imaging: No results found.  Labs: BMET  Lab 09/13/12 0625 09/12/12 0537 09/11/12 0610 09/10/12 0610 09/09/12 1707 09/09/12 0319 09/08/12 1814  NA 136 134* 133* 136 134* 134* 132*  K 3.8 3.9 3.9 3.5 3.9 3.9 4.2  CL 98 98 96 99 97 99 96  CO2 22 22 23 25 24 25 26   GLUCOSE 83 144* 168* 105* 143* 282* 352*  BUN 45* 51* 48* 38* 35* 29* 27*  CREATININE 1.97* 3.11* 4.07* 3.07* 2.44* 1.28 0.97  ALB -- -- -- -- -- -- --  CALCIUM 8.9 8.7 8.5 8.8 9.0 8.9 9.4  PHOS 3.8 4.2 5.5* -- -- -- --   CBC  Lab 09/13/12 0625 09/09/12 0319 09/08/12 1814  WBC 3.7* 5.4 5.4  NEUTROABS -- -- 3.4  HGB 9.3* 10.2* 10.8*  HCT 27.2* 29.1* 30.1*  MCV 85.8 84.6 84.3  PLT 226 159 169    Medications:       . aspirin  81 mg Oral Daily  . benzonatate  100 mg Oral TID  . budesonide-formoterol  2 puff Inhalation BID  . doxazosin  4 mg Oral QHS  . furosemide  40 mg Intravenous BID  . heparin  5,000 Units Subcutaneous Q8H  . insulin aspart  0-15 Units Subcutaneous TID WC  . insulin glargine  75 Units Subcutaneous QHS  . metoprolol tartrate  25 mg Oral BID  . metroNIDAZOLE  500 mg Oral Q8H  .  pantoprazole  40 mg Oral BID AC  . prasugrel  10 mg Oral Daily  . sodium chloride  3 mL Intravenous Q12H     Assessment/ Plan:   1. AKI: suspected CINP in a patient with underlying DM/ARB use. Good Cr improvement.  Continues on IV lasix.  Will continue current dosing and monitor volume status given increase losses from stool.  UOP not accurate, urine remains dark on inspection.  No indication for dialysis 2. Hyponatremia:  Resolved.  due to ARF and impaired free water handling. Will restrict PO fluids to 1.2L/day but monitor fluid status 3. ACS/NSTEMI: s/p PCI and clinically doing better 4. Possible HCAP: Vanco & Zosyn stopped, No infiltrate on initial CXR - given pulmonary findings would consider repeating X-ray to evaluate for infiltrate given leukopenia, fever.   5. C Diff Colitis - Febrile overnight -  6. Leukopenia - C Dif related 7. Anemia - Hb 9.3 - Tranfusion threshold 8.0 given cardiac status.  Iron Panel obtained  Gerda Diss, DO [place]  [place] Resident - PGY-2 09/13/2012 8:54 AM

## 2012-09-13 NOTE — Progress Notes (Signed)
I have personally seen and examined this patient and agree with the assessment/plan as outlined above by Paulla Fore DO. Renal function improving as the patient recovers from AKI from [place]. Unfortunately he now has C difficile colitis that may predispose him to volume depletion and compound his AKI- will decrease frequency/dose of lasix. Jamilia Jacques K.,MD 09/13/2012 11:32 AM

## 2012-09-13 NOTE — Progress Notes (Signed)
[facility] Medical Consultation  Eugene [name] [id number] DOB: [birth year] DOA: 09/08/2012 PCP: [name], MD   Requesting physician: cardiology Reason for consultation: c diff  Impression/Recommendations Active Problems:  C. difficile colitis    1. c diff colitis: diarrhea improving flagyl PO, contact precautions, avoid imodium, stop IV abx (no sign of PNA on chest x ray) fever on 1/15 could have been from c diff- check CBC in AM- not currently on O2 2. DM- SSI, lantus (BS 108-209) 3. ARF- nephrology following 4. pulm edema- per primary 5. S/p cath for inferior STEMI    Chief Complaint: diarrhea  HPI:  Patient states he had diarrhea for about 1 week before he came into the hospital.  He presented here with 3 day h/o intermittent chest pain and dyspnea and has been found to have an inferior STEMI. No recent abx usage No prior hx of c diff No recent hospitalization No sick contacts No abdominal pain No blood in stool Fever x 1 +cough- patient states he had this before he came in   Review of Systems:  All systems reviewed, negative unless stated above  Past Medical History  Diagnosis Date  . Arteriosclerotic cardiovascular disease (ASCVD)     a. 05/2011 Cath/PCI: LM nl, LAD 28m, D1 small, D2 small 71m, LCX large 40p, RCA 50-60p, 99 hazy @ origin of PDA with 70-80 in PDA (2.5x26 Resolute Integrity & 3.0x15 Resolute Integrity DES).;  b. 05/2012 Echo: EF 55-60%  . Hyperlipidemia   . Diabetes mellitus     Peripheral neuropathy  . Bell palsy   . Hypertension   . COPD (chronic obstructive pulmonary disease)   . Sleep apnea   . Gallstones   . GERD (gastroesophageal reflux disease) 05/10/2012  . Nephrolithiasis   . Cholelithiasis 07/2012    Asymptomatic; identified incidentally  . PONV (postoperative nausea and vomiting)   . Anxiety   . Myocardial infarction   . Coronary artery disease   . Dysrhythmia    Past Surgical History  Procedure Date  .  Circumcision   . Stents   . Esophagogastroduodenoscopy     [place] over 20 yrs ago  . Colonoscopy     In [facility], approximately 2011 per patient, was normal. Advised to come back in 10 years.  . Esophagogastroduodenoscopy 06/12/2012    Procedure: ESOPHAGOGASTRODUODENOSCOPY (EGD);  Surgeon: Daneil Dolin, MD;  [facility];  Service: Endoscopy;  Laterality: N/A;  9:45  . Cardiac catheterization    Social History:  reports that he has never smoked. He does not have any smokeless tobacco history on file. He reports that he does not drink alcohol or use illicit drugs.  Allergies  Allergen Reactions  . Hydrocodone Nausea And Vomiting  . Statins Other (See Comments)    Muscle aches   Family History  Problem Relation Age of Onset  . Diabetes Mother   . Heart attack Mother   . Stroke Mother   . Diabetes Sister   . Sleep apnea Sister   . Hypertension Brother   . Diabetes Brother   . Colon cancer Neg Hx   . Liver disease Neg Hx   . Diabetes Brother   . Hypertension Brother     Prior to Admission medications   Medication Sig Start Date End Date Taking? Authorizing Provider  albuterol (PROAIR HFA) 108 (90 BASE) MCG/ACT inhaler Inhale 2 puffs into the lungs every 6 (six) hours as needed. For shortness of breath   Yes Historical  Provider, MD  ALPRAZolam Duanne Moron) 0.5 MG tablet Take 0.5 mg by mouth at bedtime as needed. Sleep aid   Yes Historical Provider, MD  aspirin EC 81 MG tablet Take 81 mg by mouth at bedtime.    Yes Historical Provider, MD  budesonide-formoterol (SYMBICORT) 160-4.5 MCG/ACT inhaler Inhale 2 puffs into the lungs 2 (two) times daily.   Yes Historical Provider, MD  doxazosin (CARDURA) 4 MG tablet Take 1 tablet (4 mg total) by mouth at bedtime. 08/18/12  Yes Yehuda Savannah, MD  Flaxseed, Linseed, (FLAXSEED OIL) 1000 MG CAPS Take 2,000 mg by mouth daily.    Yes Historical Provider, MD  furosemide (LASIX) 40 MG tablet Take 40 mg twice a day  08/01/12  Yes Yehuda Savannah, MD  insulin aspart (NOVOLOG) 100 UNIT/ML injection Inject 3-25 Units into the skin 2 (two) times daily. SLIDING SCALE  NOVOLOG INSULIN BEFORE BREAKFAST AND BEFORE DINNER:   FOR A BLOOD SUGAR OF 70-120 GIVE 0 UNITS.   FOR BLOOD SUGAR OF 121-150 GIVE 3 UNITS.   FOR BLOOD SUGAR OF 151-200 GIVE 4 UNITS.   FOR BLOOD SUGAR 201-250 GIVE 7 UNITS.   FOR BLOOD SUGAR 251-300 GIVE 11 UNITS.   FOR BLOOD SUGAR 301-350 GIVE 15 UNITS.   FOR BLOOD SUGAR 351-400 GIVE 20 UNITS.    FOR BLOOD SUGAR OVER 400 GIVE 25 UNITS AND CALL YOUR DOCTOR. 05/11/12  Yes Rexene Alberts, MD  insulin glargine (LANTUS) 100 UNIT/ML injection Inject 75 Units into the skin at bedtime. 05/11/12 05/11/13 Yes Rexene Alberts, MD  pantoprazole (PROTONIX) 40 MG tablet Take 1 tablet (40 mg total) by mouth 2 (two) times daily before a meal. 08/07/12 08/07/13 Yes Mahala Menghini, PA  valsartan (DIOVAN) 320 MG tablet Take 320 mg by mouth daily. 06/03/12  Yes [name], MD  promethazine (PHENERGAN) 25 MG tablet Take 1 tablet (25 mg total) by mouth every 6 (six) hours as needed for nausea. 06/01/12   [name], MD   Physical Exam: Blood pressure 110/71, pulse 77, temperature 98.5 F (36.9 C), temperature source Oral, resp. rate 18, height 6\' 1"  (1.854 m), weight 111.494 kg (245 lb 12.8 oz), SpO2 91.00%. Filed Vitals:   09/12/12 1944 09/13/12 0443 09/13/12 0930 09/13/12 1343  BP: 144/75 118/55  110/71  Pulse: 94 86  77  Temp: 99.7 F (37.6 C) 100.4 F (38 C)  98.5 F (36.9 C)  TempSrc: Oral Oral  Oral  Resp: 18 19  18   Height:      Weight:  111.494 kg (245 lb 12.8 oz)    SpO2: 93% 94% 90% 91%     General:  A+Ox3, NAD  Eyes: wnl  ENT: wnl, not dry  Neck: supple  Cardiovascular: rrr  Respiratory: clear anterior  Abdomen: +BS, no tender, obese  Skin: no rashes or lesions, + edema in LE mild  Musculoskeletal: moves all 4 extremitites  Psychiatric: no SI/no HI  Neurologic: CN 2-12 intact  Labs  on Admission:  Basic Metabolic Panel:  Lab [id number] 0625 09/12/12 0537 09/11/12 0610 09/10/12 0610 09/09/12 1707  NA 136 134* 133* 136 134*  K 3.8 3.9 3.9 3.5 3.9  CL 98 98 96 99 97  CO2 22 22 23 25 24   GLUCOSE 83 144* 168* 105* 143*  BUN 45* 51* 48* 38* 35*  CREATININE 1.97* 3.11* 4.07* 3.07* 2.44*  CALCIUM 8.9 8.7 8.5 8.8 9.0  MG -- -- -- -- 2.0  PHOS 3.8 4.2 5.5* -- --  Liver Function Tests:  Lab 09/13/12 0625 09/12/12 0537 09/11/12 0610 09/08/12 1814  AST -- -- -- 20  ALT -- -- -- 15  ALKPHOS -- -- -- 77  BILITOT -- -- -- 0.5  PROT -- -- -- 6.6  ALBUMIN 2.6* 2.8* 2.4* 2.7*    Lab 09/08/12 1814  LIPASE 11  AMYLASE --   No results found for this basename: AMMONIA:5 in the last 168 hours CBC:  Lab 09/13/12 0625 09/09/12 0319 09/08/12 1814  WBC 3.7* 5.4 5.4  NEUTROABS -- -- 3.4  HGB 9.3* 10.2* 10.8*  HCT 27.2* 29.1* 30.1*  MCV 85.8 84.6 84.3  PLT 226 159 169   Cardiac Enzymes:  Lab 09/09/12 0839 09/09/12 0313 09/08/12 2117 09/08/12 1814  CKTOTAL -- -- -- --  CKMB -- -- -- --  CKMBINDEX -- -- -- --  TROPONINI 8.44* 9.29* 4.20* 4.04*   BNP: No components found with this basename: POCBNP:5 CBG:  Lab 09/13/12 1121 09/13/12 0600 09/12/12 2121 09/12/12 1635 09/12/12 1157  GLUCAP 128* 81 98 146* 108*    Radiological Exams on Admission: No results found.    Time spent: 25 min  [facility] Pager [phone]  If 7PM-7AM, please contact night-coverage [email or web address] Password [facility] 09/13/2012, 3:32 PM

## 2012-09-13 NOTE — Progress Notes (Signed)
CARDIAC REHAB PHASE I   PRE:  Rate/Rhythm: 80 SR  BP:  Supine:   Sitting: 110/68  Standing:    SaO2: 96 % RA  MODE:  Ambulation: 550 ft   POST:  Rate/Rhythem: 80 SR  BP:  Supine:   Sitting: 110/68  Standing:    SaO2: 92% RA Pt ambulated x1 assist. No c/o CP or being dyspneic. Did really well. Took 2 rest breaks due to feet pain. Patients states it was arthritic pain. Will continue to follow patient as needed. Patient back to bedside with call light in reach.   Tykee Heideman, Loma Sousa

## 2012-09-14 DIAGNOSIS — N289 Disorder of kidney and ureter, unspecified: Secondary | ICD-10-CM | POA: Diagnosis not present

## 2012-09-14 DIAGNOSIS — I2119 ST elevation (STEMI) myocardial infarction involving other coronary artery of inferior wall: Secondary | ICD-10-CM | POA: Diagnosis present

## 2012-09-14 LAB — RENAL FUNCTION PANEL
Albumin: 2.5 g/dL — ABNORMAL LOW (ref 3.5–5.2)
BUN: 39 mg/dL — ABNORMAL HIGH (ref 6–23)
Calcium: 9 mg/dL (ref 8.4–10.5)
Phosphorus: 4.1 mg/dL (ref 2.3–4.6)
Potassium: 3.8 mEq/L (ref 3.5–5.1)
Sodium: 137 mEq/L (ref 135–145)

## 2012-09-14 LAB — GLUCOSE, CAPILLARY: Glucose-Capillary: 137 mg/dL — ABNORMAL HIGH (ref 70–99)

## 2012-09-14 MED ORDER — FERUMOXYTOL INJECTION 510 MG/17 ML
510.0000 mg | Freq: Once | INTRAVENOUS | Status: AC
  Administered 2012-09-14: 510 mg via INTRAVENOUS
  Filled 2012-09-14: qty 17

## 2012-09-14 MED ORDER — PNEUMOCOCCAL VAC POLYVALENT 25 MCG/0.5ML IJ INJ
0.5000 mL | INJECTION | INTRAMUSCULAR | Status: AC
  Filled 2012-09-14: qty 0.5

## 2012-09-14 NOTE — Progress Notes (Addendum)
Patient ID: Eugene [name], male   DOB: 1956/02/21, 57 y.o.   MRN: [id number]   [facility] Progress Note    Subjective:   Reports to be feeling slightly better with improving diarrhea but some persistent mild dyspnea   Objective:   BP 119/76  Pulse 70  Temp 98.5 F (36.9 C) (Oral)  Resp 19  Ht 6\' 1"  (1.854 m)  Wt 110.95 kg (244 lb 9.6 oz)  BMI 32.27 kg/m2  SpO2 92%  Intake/Output Summary (Last 24 hours) at 09/14/12 0854 Last data filed at 09/14/12 0100  Gross per 24 hour  Intake      0 ml  Output   1400 ml  Net  -1400 ml   Weight change: -0.544 kg (-1 lb 3.2 oz)  Physical Exam: [id number] sitting up in bed- eating breakfast [id number] RRR, normal S1 and S2  Resp: Fine rales left base, no wheeze [id number], obese, NT, BS normal Ext:2+ LE edema  Imaging: No results found.  Labs: BMET  Lab 09/14/12 0600 09/13/12 0625 09/12/12 0537 09/11/12 0610 09/10/12 0610 09/09/12 1707 09/09/12 0319  NA 137 136 134* 133* 136 134* 134*  K 3.8 3.8 3.9 3.9 3.5 3.9 3.9  CL 101 98 98 96 99 97 99  CO2 25 22 22 23 25 24 25   GLUCOSE 128* 83 144* 168* 105* 143* 282*  BUN 39* 45* 51* 48* 38* 35* 29*  CREATININE 1.33 1.97* 3.11* 4.07* 3.07* 2.44* 1.28  ALB -- -- -- -- -- -- --  CALCIUM 9.0 8.9 8.7 8.5 8.8 9.0 8.9  PHOS 4.1 3.8 4.2 5.5* -- -- --   CBC  Lab 09/13/12 0625 09/09/12 0319 09/08/12 1814  WBC 3.7* 5.4 5.4  NEUTROABS -- -- 3.4  HGB 9.3* 10.2* 10.8*  HCT 27.2* 29.1* 30.1*  MCV 85.8 84.6 84.3  PLT 226 159 169    Medications:      . aspirin  81 mg Oral Daily  . benzonatate  100 mg Oral TID  . budesonide-formoterol  2 puff Inhalation BID  . doxazosin  4 mg Oral QHS  . furosemide  40 mg Intravenous Daily  . heparin  5,000 Units Subcutaneous Q8H  . insulin aspart  0-15 Units Subcutaneous TID WC  . insulin glargine  75 Units Subcutaneous QHS  . metoprolol tartrate  25 mg Oral BID  . metroNIDAZOLE  500 mg Oral Q8H  . pantoprazole  40 mg Oral BID  AC  . prasugrel  10 mg Oral Daily  . sodium chloride  3 mL Intravenous Q12H     Assessment/ Plan:   1. AKI: suspected CINP in a patient with underlying DM/ARB use. Renal function continues to improve with good UOP on intermittent lasix doses. No acute volume/elctrolyte concerns at this time. 2. ACS/NSTEMI: s/p PCI and clinically doing better  3. C Diff Colitis - Afebrile overnight - diarrhea improving on metronidazole  4. Anemia - Hb 9.3 - Tranfusion threshold 8.0 given cardiac status. Iron Panel shows mild deficiency- will give IV Fe. (infection seems controlled)  Will sign off - please call for further assistance in management. He will not be set up for renal follow up and may be referred by his PCP if concerns arise.   Elmarie Shiley, MD 09/14/2012, 8:54 AM

## 2012-09-14 NOTE — Progress Notes (Signed)
[facility] Medical Consultation  Liberty Netherland Gorrell [id number] DOB: July 01, 1956 DOA: 09/08/2012 PCP: [name], MD   Requesting physician: cardiology Reason for consultation: c diff  Impression/Recommendations Principal Problem:  *ST elevation myocardial infarction (STEMI) of inferolateral wall, initial episode of care Active Problems:  DM type 2 (diabetes mellitus, type 2)  Hyperlipidemia  C. difficile colitis  ARF (acute renal failure)  Acute systolic CHF (congestive heart failure), NYHA class 3    1. c diff colitis: diarrhea improving flagyl PO, contact precautions, avoid imodium, stop IV abx (no sign of PNA on chest x ray) fever on 1/15 could have been from c diff- check CBC in AM- not currently on O2. Would continue flagyl for 14 days on DC 2. DM- SSI, lantus (BS 108-209) 3. ARF- nephrology following 4. pulm edema- per primary 5. S/p cath for inferior STEMI  Will sign off for now. Please call back with questions.  Chief Complaint: diarrhea  HPI:  Patient states he had diarrhea for about 1 week before he came into the hospital.  He presented here with 3 day h/o intermittent chest pain and dyspnea and has been found to have an inferior STEMI. No recent abx usage No prior hx of c diff No recent hospitalization No sick contacts No abdominal pain No blood in stool Fever x 1 +cough- patient states he had this before he came in   Review of Systems:  All systems reviewed, negative unless stated above  Past Medical History  Diagnosis Date  . Arteriosclerotic cardiovascular disease (ASCVD)     a. 05/2011 Cath/PCI: LM nl, LAD 61m, D1 small, D2 small 84m, LCX large 40p, RCA 50-60p, 99 hazy @ origin of PDA with 70-80 in PDA (2.5x26 Resolute Integrity & 3.0x15 Resolute Integrity DES).;  b. 05/2012 Echo: EF 55-60%  . Hyperlipidemia   . Diabetes mellitus     Peripheral neuropathy  . Bell palsy   . Hypertension   . COPD (chronic obstructive pulmonary disease)     . Sleep apnea   . Gallstones   . GERD (gastroesophageal reflux disease) 05/10/2012  . Nephrolithiasis   . Cholelithiasis 07/2012    Asymptomatic; identified incidentally  . PONV (postoperative nausea and vomiting)   . Anxiety   . Myocardial infarction   . Coronary artery disease   . Dysrhythmia    Past Surgical History  Procedure Date  . Circumcision   . Stents   . Esophagogastroduodenoscopy     in [place] over 20 yrs ago  . Colonoscopy     In [facility], approximately 2011 per patient, was normal. Advised to come back in 10 years.  . Esophagogastroduodenoscopy 06/12/2012    Procedure: ESOPHAGOGASTRODUODENOSCOPY (EGD);  Surgeon: Daneil Dolin, MD;  Location: [facility];  Service: Endoscopy;  Laterality: N/A;  9:45  . Cardiac catheterization    Social History:  reports that he has never smoked. He does not have any smokeless tobacco history on file. He reports that he does not drink alcohol or use illicit drugs.  Allergies  Allergen Reactions  . Hydrocodone Nausea And Vomiting  . Statins Other (See Comments)    Muscle aches   Family History  Problem Relation Age of Onset  . Diabetes Mother   . Heart attack Mother   . Stroke Mother   . Diabetes Sister   . Sleep apnea Sister   . Hypertension Brother   . Diabetes Brother   . Colon cancer Neg Hx   . Liver disease Neg Hx   .  Diabetes Brother   . Hypertension Brother     Prior to Admission medications   Medication Sig Start Date End Date Taking? Authorizing Provider  albuterol (PROAIR HFA) 108 (90 BASE) MCG/ACT inhaler Inhale 2 puffs into the lungs every 6 (six) hours as needed. For shortness of breath   Yes Historical Provider, MD  ALPRAZolam Duanne Moron) 0.5 MG tablet Take 0.5 mg by mouth at bedtime as needed. Sleep aid   Yes Historical Provider, MD  aspirin EC 81 MG tablet Take 81 mg by mouth at bedtime.    Yes Historical Provider, MD  budesonide-formoterol (SYMBICORT) 160-4.5 MCG/ACT inhaler Inhale 2  puffs into the lungs 2 (two) times daily.   Yes Historical Provider, MD  doxazosin (CARDURA) 4 MG tablet Take 1 tablet (4 mg total) by mouth at bedtime. 08/18/12  Yes Yehuda Savannah, MD  Flaxseed, Linseed, (FLAXSEED OIL) 1000 MG CAPS Take 2,000 mg by mouth daily.    Yes Historical Provider, MD  furosemide (LASIX) 40 MG tablet Take 40 mg twice a day 08/01/12  Yes Yehuda Savannah, MD  insulin aspart (NOVOLOG) 100 UNIT/ML injection Inject 3-25 Units into the skin 2 (two) times daily. SLIDING SCALE  NOVOLOG INSULIN BEFORE BREAKFAST AND BEFORE DINNER:   FOR A BLOOD SUGAR OF 70-120 GIVE 0 UNITS.   FOR BLOOD SUGAR OF 121-150 GIVE 3 UNITS.   FOR BLOOD SUGAR OF 151-200 GIVE 4 UNITS.   FOR BLOOD SUGAR 201-250 GIVE 7 UNITS.   FOR BLOOD SUGAR 251-300 GIVE 11 UNITS.   FOR BLOOD SUGAR 301-350 GIVE 15 UNITS.   FOR BLOOD SUGAR 351-400 GIVE 20 UNITS.    FOR BLOOD SUGAR OVER 400 GIVE 25 UNITS AND CALL YOUR DOCTOR. 05/11/12  Yes Rexene Alberts, MD  insulin glargine (LANTUS) 100 UNIT/ML injection Inject 75 Units into the skin at bedtime. 05/11/12 05/11/13 Yes Rexene Alberts, MD  pantoprazole (PROTONIX) 40 MG tablet Take 1 tablet (40 mg total) by mouth 2 (two) times daily before a meal. 08/07/12 08/07/13 Yes Mahala Menghini, PA  valsartan (DIOVAN) 320 MG tablet Take 320 mg by mouth daily. 06/03/12  Yes [name], MD  promethazine (PHENERGAN) 25 MG tablet Take 1 tablet (25 mg total) by mouth every 6 (six) hours as needed for nausea. 06/01/12   [name], MD   Physical Exam: Blood pressure 119/76, pulse 70, temperature 98.5 F (36.9 C), temperature source Oral, resp. rate 19, height 6\' 1"  (1.854 m), weight 110.95 kg (244 lb 9.6 oz), SpO2 92.00%. Filed Vitals:   09/13/12 1942 09/13/12 2001 09/14/12 0500 09/14/12 0825  BP:  128/63 119/76   Pulse: 91 80 70   Temp:  98.6 F (37 C) 98.5 F (36.9 C)   TempSrc:  Oral Oral   Resp: 18 20 19    Height:      Weight:   110.95 kg (244 lb 9.6 oz)   SpO2: 92% 95% 95% 92%      General:  A+Ox3, NAD  Eyes: wnl  ENT: wnl, not dry  Neck: supple  Cardiovascular: rrr  Respiratory: clear anterior  Abdomen: +BS, no tender, obese  Skin: no rashes or lesions, + edema in LE mild  Musculoskeletal: moves all 4 extremitites  Psychiatric: no SI/no HI  Neurologic: CN 2-12 intact  Labs on Admission:  Basic Metabolic Panel:  Lab [id number] 0600 09/13/12 0625 09/12/12 0537 09/11/12 0610 09/10/12 0610 09/09/12 1707  NA 137 136 134* 133* 136 --  K 3.8 3.8 3.9 3.9 3.5 --  CL 101 98 98 96 99 --  CO2 25 22 22 23 25  --  GLUCOSE 128* 83 144* 168* 105* --  BUN 39* 45* 51* 48* 38* --  CREATININE 1.33 1.97* 3.11* 4.07* 3.07* --  CALCIUM 9.0 8.9 8.7 8.5 8.8 --  MG -- -- -- -- -- 2.0  PHOS 4.1 3.8 4.2 5.5* -- --   Liver Function Tests:  Lab 09/14/12 0600 09/13/12 0625 09/12/12 0537 09/11/12 0610 09/08/12 1814  AST -- -- -- -- 20  ALT -- -- -- -- 15  ALKPHOS -- -- -- -- 77  BILITOT -- -- -- -- 0.5  PROT -- -- -- -- 6.6  ALBUMIN 2.5* 2.6* 2.8* 2.4* 2.7*    Lab 09/08/12 1814  LIPASE 11  AMYLASE --   No results found for this basename: AMMONIA:5 in the last 168 hours CBC:  Lab 09/13/12 0625 09/09/12 0319 09/08/12 1814  WBC 3.7* 5.4 5.4  NEUTROABS -- -- 3.4  HGB 9.3* 10.2* 10.8*  HCT 27.2* 29.1* 30.1*  MCV 85.8 84.6 84.3  PLT 226 159 169   Cardiac Enzymes:  Lab 09/09/12 0839 09/09/12 0313 09/08/12 2117 09/08/12 1814  CKTOTAL -- -- -- --  CKMB -- -- -- --  CKMBINDEX -- -- -- --  TROPONINI 8.44* 9.29* 4.20* 4.04*   BNP: No components found with this basename: POCBNP:5 CBG:  Lab 09/14/12 1108 09/14/12 0615 09/13/12 2106 09/13/12 1613 09/13/12 1121  GLUCAP 150* 137* 129* 171* 128*    Radiological Exams on Admission: No results found.    Time spent: 25 min  [facility] Pager [phone]  If 7PM-7AM, please contact night-coverage [email or web address] Password [facility] 09/14/2012, 11:26 AM

## 2012-09-14 NOTE — Progress Notes (Signed)
Patient Name: Eugene [name] Date of Encounter: 09/14/2012  Principal Problem:  *ST elevation myocardial infarction (STEMI) of inferolateral wall, initial episode of care Active Problems:  ARF (acute renal failure)  Acute systolic CHF (congestive heart failure), NYHA class 3  DM type 2 (diabetes mellitus, type 2)  Hyperlipidemia  C. difficile colitis    SUBJECTIVE: Still with cough, DOE but doing better, > 5 episodes diarrhea yest, but improving, eating well.  OBJECTIVE Filed Vitals:   09/13/12 1343 09/13/12 1942 09/13/12 2001 09/14/12 0500  BP: 110/71  128/63 119/76  Pulse: 77 91 80 70  Temp: 98.5 F (36.9 C)  98.6 F (37 C) 98.5 F (36.9 C)  TempSrc: Oral  Oral Oral  Resp: 18 18 20 19   Height:      Weight:    244 lb 9.6 oz (110.95 kg)  SpO2: 91% 92% 95% 95%    Intake/Output Summary (Last 24 hours) at 09/14/12 0818 Last data filed at 09/14/12 0100  Gross per 24 hour  Intake      0 ml  Output   1400 ml  Net  -1400 ml   Filed Weights   09/12/12 0515 09/13/12 0443 09/14/12 0500  Weight: 248 lb 8 oz (112.719 kg) 245 lb 12.8 oz (111.494 kg) 244 lb 9.6 oz (110.95 kg)     PHYSICAL EXAM General: Well developed, well nourished, male in no acute distress. Head: Normocephalic, atraumatic.  Neck: Supple without bruits, JVD elevated approx 10cm. Lungs:  Resp regular and unlabored, crackles bases. Heart: RRR, S1, S2, no S3, S4, soft systolic murmur. Abdomen: Soft, non-tender, non-distended, BS + x 4.  Extremities: No clubbing, cyanosis, 1-2+ edema.  Neuro: Alert and oriented X 3. Moves all extremities spontaneously. Psych: Normal affect.  LABS: CBC:  Basename 09/13/12 0625  WBC 3.7*  NEUTROABS --  HGB 9.3*  HCT 27.2*  MCV 85.8  PLT 226   INR:No results found for this basename: INR in the last 72 hours Basic Metabolic Panel:  Basename 09/14/12 0600 09/13/12 0625  NA 137 136  K 3.8 3.8  CL 101 98  CO2 25 22  GLUCOSE 128* 83  BUN 39* 45*    CREATININE 1.33 1.97*  CALCIUM 9.0 8.9  MG -- --  PHOS 4.1 3.8   Liver Function Tests:  Basename 09/14/12 0600 09/13/12 0625  AST -- --  ALT -- --  ALKPHOS -- --  BILITOT -- --  PROT -- --  ALBUMIN 2.5* 2.6*   Cardiac Enzymes: Lab Results  Component Value Date   CKTOTAL 179 08/22/2011   CKMB 4.9* 08/22/2011   TROPONINI 8.44* 09/09/2012   Fasting Lipid Panel: Lab Results  Component Value Date   CHOL 229* 09/09/2012   HDL 36* 09/09/2012   LDLCALC 117* 09/09/2012   TRIG 381* 09/09/2012   CHOLHDL 6.4 09/09/2012   Anemia Panel:  Basename 09/13/12 0920  VITAMINB12 394  FOLATE 10.5  FERRITIN 448*  TIBC 166*  IRON 21*  RETICCTPCT 1.3    TELE:    SR     ECG:   Radiology/Studies: Dg Chest 1 View  09/08/2012  *RADIOLOGY REPORT*  Clinical Data: Chest pain  CHEST - 1 VIEW  Comparison: 06/16/2012  Findings: Low lung volumes.  Bibasilar atelectasis.  Allowing for low volumes, the heart is upper normal in size.  Pulmonary vascularity is within normal limits.  No Kerley B lines to suggest edema.  No pneumothorax.  No acute bony deformity.  IMPRESSION: Bibasilar atelectasis.  No evidence of pulmonary edema.   Original Report Authenticated By: Marybelle Killings, M.D.    Dg Chest Port 1 View  09/09/2012  *RADIOLOGY REPORT*  Clinical Data: Shortness of breath, cough  PORTABLE CHEST - 1 VIEW  Comparison: 09/08/2012  Findings: Cardiomegaly with pulmonary vascular congestion and possible mild interstitial edema. No pleural effusion or pneumothorax.  IMPRESSION: Cardiomegaly with pulmonary vascular congestion and possible mild interstitial edema.   Original Report Authenticated By: Julian Hy, M.D.     Current Medications:     . aspirin  81 mg Oral Daily  . benzonatate  100 mg Oral TID  . budesonide-formoterol  2 puff Inhalation BID  . doxazosin  4 mg Oral QHS  . furosemide  40 mg Intravenous Daily  . heparin  5,000 Units Subcutaneous Q8H  . insulin aspart  0-15 Units Subcutaneous  TID WC  . insulin glargine  75 Units Subcutaneous QHS  . metoprolol tartrate  25 mg Oral BID  . metroNIDAZOLE  500 mg Oral Q8H  . pantoprazole  40 mg Oral BID AC  . prasugrel  10 mg Oral Daily  . sodium chloride  3 mL Intravenous Q12H      ASSESSMENT AND PLAN: 1. Acute RCA STEMI: Continue ASA, effient, and metoprolol.   2. Acute renal failure: The cause of the acute renal failure is likely multifactorial - diabetes, ARB, IV contrast. Creatinine is improving. Continue present dose of lasix and follow renal function.   3. Hyperlipidemia:  Lab Results   Component  Value  Date    CHOL  229*  09/09/2012    HDL  36*  09/09/2012    LDLCALC  117*  09/09/2012    TRIG  381*  09/09/2012    CHOLHDL  6.4  09/09/2012   He is intolerant to statins.   4. PVCs acute Resolved - continue BB  5. Acute systolic CHF:  Add ACEI later once renal function stable  6. C diff colitis  Continue flagyl.   Disposition:  Length of Stay: 6   Signed, Rosaria Ferries , PA-C 8:18 AM 09/14/2012 As above; patient seen and examined; no chest pain; diarrhea improving; continue present meds; consider ACEI in AM if renal function continues to improve; possible DC in AM if stable Kirk Ruths 8:37 AM

## 2012-09-15 ENCOUNTER — Inpatient Hospital Stay (HOSPITAL_COMMUNITY): Payer: PRIVATE HEALTH INSURANCE

## 2012-09-15 DIAGNOSIS — I517 Cardiomegaly: Secondary | ICD-10-CM

## 2012-09-15 LAB — RENAL FUNCTION PANEL
CO2: 23 mEq/L (ref 19–32)
Chloride: 102 mEq/L (ref 96–112)
Creatinine, Ser: 1.01 mg/dL (ref 0.50–1.35)
GFR calc Af Amer: >90 mL/min (ref >90)
GFR calc non Af Amer: 82 mL/min — ABNORMAL LOW (ref >90)
Sodium: 135 mEq/L (ref 135–145)

## 2012-09-15 LAB — GLUCOSE, CAPILLARY
Glucose-Capillary: 136 mg/dL — ABNORMAL HIGH (ref 70–99)
Glucose-Capillary: 136 mg/dL — ABNORMAL HIGH (ref 70–99)

## 2012-09-15 LAB — CBC
MCH: 29.4 pg (ref 26.0–34.0)
MCHC: 34.5 g/dL (ref 30.0–36.0)
Platelets: 235 10*3/uL (ref 150–400)
RDW: 12.8 % (ref 11.5–15.5)

## 2012-09-15 MED ORDER — ALBUTEROL SULFATE (5 MG/ML) 0.5% IN NEBU
INHALATION_SOLUTION | RESPIRATORY_TRACT | Status: AC
  Filled 2012-09-15: qty 0.5

## 2012-09-15 MED ORDER — ALBUTEROL SULFATE (5 MG/ML) 0.5% IN NEBU
2.5000 mg | INHALATION_SOLUTION | Freq: Four times a day (QID) | RESPIRATORY_TRACT | Status: DC | PRN
  Administered 2012-09-15: 2.5 mg via RESPIRATORY_TRACT

## 2012-09-15 MED ORDER — LORATADINE 10 MG PO TABS
10.0000 mg | ORAL_TABLET | Freq: Every day | ORAL | Status: DC | PRN
  Administered 2012-09-15: 10 mg via ORAL
  Filled 2012-09-15: qty 1

## 2012-09-15 MED ORDER — FUROSEMIDE 10 MG/ML IJ SOLN
80.0000 mg | Freq: Once | INTRAMUSCULAR | Status: AC
  Administered 2012-09-15: 80 mg via INTRAVENOUS
  Filled 2012-09-15: qty 4

## 2012-09-15 NOTE — Progress Notes (Signed)
EKG results called to Dr. Burt Knack. Cindee Salt

## 2012-09-15 NOTE — Progress Notes (Signed)
Patient Name: Eugene [name] Date of Encounter: 09/15/2012     Principal Problem:  *ST elevation myocardial infarction (STEMI) of inferolateral wall, initial episode of care Active Problems:  DM type 2 (diabetes mellitus, type 2)  Hyperlipidemia  C. difficile colitis  ARF (acute renal failure)  Acute systolic CHF (congestive heart failure), NYHA class 3    SUBJECTIVE: Tolerated cardiac rehab well. O2 sat's WNL per note. Reports SOB at rest on interview. No chest pain or lightheadedness. Denies diarrhea.    OBJECTIVE  Filed Vitals:   09/14/12 1421 09/14/12 2106 09/15/12 0435 09/15/12 0752  BP: 143/83 135/68 122/62   Pulse: 73 77 69   Temp: 97.6 F (36.4 C) 99.1 F (37.3 C) 97.5 F (36.4 C)   TempSrc: Oral Oral Oral   Resp: 18 21 20    Height:      Weight:   111.267 kg (245 lb 4.8 oz)   SpO2: 99% 92% 100% 100%    Intake/Output Summary (Last 24 hours) at 09/15/12 0945 Last data filed at 09/15/12 0436  Gross per 24 hour  Intake      3 ml  Output   1325 ml  Net  -1322 ml   Weight change: 0.318 kg (11.2 oz)  PHYSICAL EXAM  General: Obese, well developed, in no acute distress. Head: Normocephalic, atraumatic, sclera non-icteric, no xanthomas, nares are without discharge.  Neck: Supple without bruits or JVD. Lungs: Resp regular and unlabored, expiratory wheezing noted, occasional rhonchi which clear with cough appreciated, no appreciable rales Heart: RRR no s3, s4, or murmurs. Abdomen: Soft, non-tender, non-distended, BS + x 4.  Msk:  Strength and tone appears normal for age. Extremities: 1+ bilateral pretibial edema noted. No clubbing or cyanosis. DP/PT/Radials 2+ and equal bilaterally. Neuro: Alert and oriented X 3. Moves all extremities spontaneously. Psych: Normal affect.  LABS:  Recent Labs  [facility] 09/13/12 0625   WBC 3.7*   HGB 9.3*   HCT 27.2*   MCV 85.8   PLT 226    Basename 09/13/12 0920  VITAMINB12 394  FOLATE 10.5  FERRITIN 448*    TIBC 166*  IRON 21*  RETICCTPCT 1.3   Lab 09/15/12 0530 09/14/12 0600 09/13/12 0625 09/08/12 1814  NA 135 137 136 --  K 3.8 3.8 3.8 --  CL 102 101 98 --  CO2 23 25 22  --  BUN 29* 39* 45* --  CREATININE 1.01 1.33 1.97* --  CALCIUM 8.6 9.0 8.9 --  PROT -- -- -- 6.6  BILITOT -- -- -- 0.5  ALKPHOS -- -- -- 77  ALT -- -- -- 15  AST -- -- -- 20  AMYLASE -- -- -- --  LIPASE -- -- -- 11  GLUCOSE 167* 128* 83 --   TELE: NSR, 70-80 bpm  ECG: pending   Radiology/Studies:  Dg Chest 1 View  09/08/2012  *RADIOLOGY REPORT*  Clinical Data: Chest pain  CHEST - 1 VIEW  Comparison: 06/16/2012  Findings: Low lung volumes.  Bibasilar atelectasis.  Allowing for low volumes, the heart is upper normal in size.  Pulmonary vascularity is within normal limits.  No Kerley B lines to suggest edema.  No pneumothorax.  No acute bony deformity.  IMPRESSION: Bibasilar atelectasis.  No evidence of pulmonary edema.   Original Report Authenticated By: Marybelle Killings, M.D.    US Abdomen Complete  08/22/2012  *RADIOLOGY REPORT*  Clinical Data:  Lower extremity edema, at the  COMPLETE ABDOMINAL ULTRASOUND  Comparison:  None.  Findings:  Gallbladder:  Multiple small gallstones.  No gallbladder wall thickening or pericholecystic fluid.  Negative sonographic Murphy's sign.  Common bile duct:  Measures 6 mm.  Liver:  Hyperechoic hepatic parenchyma, likely reflecting hepatic steatosis, with focal fatty sparing in the gallbladder fossa.  IVC:  Appears normal.  Pancreas:  Poorly visualized due to overlying bowel gas.  Spleen:  Borderline enlarged, measuring 13.1 cm (calculated volume 1077 mL).  Right Kidney:  Measures 15.3 cm.  No mass or hydronephrosis  Left Kidney:  Measures 14.9 cm.  Mild pelvic fullness without frank hydronephrosis.  Abdominal aorta:  No aneurysm identified.  IMPRESSION: Cholelithiasis, without associated findings to suggest acute cholecystitis.  Hepatic steatosis.  Borderline splenomegaly.   Original Report  Authenticated By: Julian Hy, M.D.    US Renal  09/10/2012  *RADIOLOGY REPORT*  Clinical Data: Acute renal insufficiency.  RENAL/URINARY TRACT ULTRASOUND COMPLETE  Comparison:  Ultrasound dated 08/22/2012  Findings:  Right Kidney:  15.4 cm in length.  Small amount of perinephric fluid adjacent to the lower pole.  Increased risk echogenicity of the renal parenchyma since the prior exam.  Left Kidney:  16.8 cm in length. Echogenic renal parenchyma, increased since the prior study.  Bladder:  Normal.  IMPRESSION: Increased echogenicity of the renal parenchyma bilaterally, new since the prior exam, consistent with renal medical disease.  New small amount of perinephric fluid around the right kidney.  No obstruction.   Original Report Authenticated By: Lorriane Shire, M.D.    US Venous Img Lower Bilateral  08/22/2012  *RADIOLOGY REPORT*  Clinical Data: Lower extremity edema  VENOUS DUPLEX ULTRASOUND OF BILATERAL LOWER EXTREMITIES  Technique:  Gray-scale sonography with graded compression, as well as color Doppler and duplex ultrasound, were performed to evaluate the deep venous system of both lower extremities from the level of the common femoral vein through the popliteal and proximal calf veins.  Spectral Doppler was utilized to evaluate flow at rest and with distal augmentation maneuvers.  Comparison:  None.  Findings: The visualized bilateral lower extremity deep venous systems appear patent.  Normal compressibility.  Patent color Doppler flow.  Satisfactory spectral Doppler with respiratory variation and response to augmentation.  The greater saphenous vein, where visualized, is patent and compressible bilaterally.  7 mm short-axis right groin node with preservation of the normal fatty hilum, likely reactive.  IMPRESSION: No deep venous thrombosis in the visualized bilateral lower extremities.   Original Report Authenticated By: Julian Hy, M.D.    Dg Chest Port 1 View  09/09/2012  *RADIOLOGY  REPORT*  Clinical Data: Shortness of breath, cough  PORTABLE CHEST - 1 VIEW  Comparison: 09/08/2012  Findings: Cardiomegaly with pulmonary vascular congestion and possible mild interstitial edema. No pleural effusion or pneumothorax.  IMPRESSION: Cardiomegaly with pulmonary vascular congestion and possible mild interstitial edema.   Original Report Authenticated By: Julian Hy, M.D.     Current Medications:     . aspirin  81 mg Oral Daily  . benzonatate  100 mg Oral TID  . budesonide-formoterol  2 puff Inhalation BID  . doxazosin  4 mg Oral QHS  . furosemide  40 mg Intravenous Daily  . heparin  5,000 Units Subcutaneous Q8H  . insulin aspart  0-15 Units Subcutaneous TID WC  . insulin glargine  75 Units Subcutaneous QHS  . metoprolol tartrate  25 mg Oral BID  . metroNIDAZOLE  500 mg Oral Q8H  . pantoprazole  40 mg Oral BID AC  . pneumococcal 23 valent vaccine  0.5 mL  Intramuscular Tomorrow-1000  . prasugrel  10 mg Oral Daily  . sodium chloride  3 mL Intravenous Q12H    ASSESSMENT AND PLAN:  1. Inferolateral STEMI- cath 1/13=> 100 % RCA occlusion s/p DES with diffuse nonobstructive disease. EF 45%. To continue DAPT- ASA/Effient x 1 year. Continue BB. No statin due to intolerance, consider alternative antihyperlipidemic.   2. Acute systolic CHF- mild pulmonary edema shortly after admission. I/O - 1446 cc. Ambulated well with cardiac rehab today without evidence of dyspnea. Restart ARB/add ACEi. Continue BB. Continue current Lasix dosing.   3. Acute renal insufficiency- multifactorial 2/2 diabetic nephropathy, ARB and contrast exposure. Cr 1.01 today, renal function back to baseline.   4. Dyspnea- pt with h/o COPD/OSA. Dyspneic on interview this AM. Tolerated cardiac rehab today without hypoxia. Will advise RN to administer albuterol for exp wheezing noted on exam. Continue ICS. He does endorse a cough productive of white sputum and has persistent LE edema. May be a component of A/CHF  still contributing. Continue Lasix. Will check EKG and consider checking a D-dimer and CXR after discussing with MD. Will add CPAP if stays tonight.   5. C diff colitis- much improved. Continue Flagyl.   6. DM2- continued SSI.   7. HTN- restart ARB/add ACEi. Continue BB.   8. HLD- statin-intolerant, consider alternative antihyperlipidemic as LDL 117.   9. Iron-deficiency anemia- IV iron given per renal recs yesterday. Low Fe on anemia panel. Check CBC today. Continue PO supplementation on discharge.    10. COPD- as above.   11. OSA on CPAP- as above.   Signed, R. Valeria Batman, PA-C 09/15/2012, 9:45 AM  Patient seen, examined. Available data reviewed. Agree with findings, assessment, and plan as outlined by Valeria Batman, PA-C. Pt with significant dyspnea at rest and with activity. I suspect acute CHF. Need to check CXR and 2D echo to rule out acute mitral regurgitation in the setting of an acute inferior MI. Will diurese with IV lasix and re-examine in the am.  [name], M.D. 09/15/2012 11:07 AM

## 2012-09-15 NOTE — Progress Notes (Signed)
CARDIAC REHAB PHASE I   PRE:  Rate/Rhythm: 76SR  BP:  Supine:   Sitting: 126/72  Standing:    SaO2: 99%2L  MODE:  Ambulation: 550 ft   POST:  Rate/Rhythem: 83  BP:  Supine:   Sitting: 142/72  Standing:    SaO2: 99%RA 0828-0915 Pt walked 550 ft with steady gait. Tolerated well. Sats good on RA. Denied CP. Education completed. Permission given to refer to [place] Phase 2.   Jeani Sow

## 2012-09-16 ENCOUNTER — Other Ambulatory Visit: Payer: Self-pay | Admitting: Nurse Practitioner

## 2012-09-16 ENCOUNTER — Encounter (HOSPITAL_COMMUNITY): Payer: Self-pay | Admitting: Nurse Practitioner

## 2012-09-16 ENCOUNTER — Encounter: Payer: Self-pay | Admitting: *Deleted

## 2012-09-16 ENCOUNTER — Other Ambulatory Visit: Payer: Self-pay | Admitting: *Deleted

## 2012-09-16 DIAGNOSIS — I5021 Acute systolic (congestive) heart failure: Secondary | ICD-10-CM

## 2012-09-16 DIAGNOSIS — I509 Heart failure, unspecified: Secondary | ICD-10-CM

## 2012-09-16 DIAGNOSIS — I2510 Atherosclerotic heart disease of native coronary artery without angina pectoris: Secondary | ICD-10-CM

## 2012-09-16 DIAGNOSIS — I219 Acute myocardial infarction, unspecified: Secondary | ICD-10-CM

## 2012-09-16 DIAGNOSIS — I5031 Acute diastolic (congestive) heart failure: Secondary | ICD-10-CM

## 2012-09-16 LAB — RENAL FUNCTION PANEL
BUN: 27 mg/dL — ABNORMAL HIGH (ref 6–23)
CO2: 24 mEq/L (ref 19–32)
Chloride: 100 mEq/L (ref 96–112)
Creatinine, Ser: 0.94 mg/dL (ref 0.50–1.35)
GFR calc non Af Amer: >90 mL/min (ref >90)

## 2012-09-16 LAB — GLUCOSE, CAPILLARY: Glucose-Capillary: 142 mg/dL — ABNORMAL HIGH (ref 70–99)

## 2012-09-16 MED ORDER — METOPROLOL TARTRATE 25 MG PO TABS: 25.0000 mg | ORAL_TABLET | Freq: Two times a day (BID) | ORAL | Status: DC

## 2012-09-16 MED ORDER — PRASUGREL HCL 10 MG PO TABS: 10.0000 mg | ORAL_TABLET | Freq: Every day | ORAL | Status: DC

## 2012-09-16 MED ORDER — METRONIDAZOLE 500 MG PO TABS: 500.0000 mg | ORAL_TABLET | Freq: Three times a day (TID) | ORAL | Status: DC

## 2012-09-16 MED ORDER — NITROGLYCERIN 0.4 MG SL SUBL: 0.4000 mg | SUBLINGUAL_TABLET | SUBLINGUAL | Status: DC | PRN

## 2012-09-16 MED ORDER — BENZONATATE 100 MG PO CAPS: 100.0000 mg | ORAL_CAPSULE | Freq: Three times a day (TID) | ORAL | Status: DC

## 2012-09-16 NOTE — Progress Notes (Signed)
    Subjective:  Pain or dyspnea. The patient feels much better this morning. His breathing is greatly improved.  Objective:  Vital Signs in the last 24 hours: Temp:  [97.7 F (36.5 C)-98 F (36.7 C)] 98 F (36.7 C) (01/21 0500) Pulse Rate:  [65-70] 68  (01/21 0925) Resp:  [18-19] 18  (01/21 0925) BP: (126-149)/(68-86) 126/68 mmHg (01/21 0500) SpO2:  [95 %-98 %] 98 % (01/21 0500) Weight:  [112.1 kg (247 lb 2.2 oz)] 112.1 kg (247 lb 2.2 oz) (01/21 0500)  Intake/Output from previous day: 01/20 0701 - 01/21 0700 In: 2160 [P.O.:2160] Out: 1876 [Urine:1875; Stool:1]  Physical Exam: Pt is alert and oriented, pleasant obese male in NAD HEENT: normal Neck: JVP - normal, carotids 2+= without bruits Lungs: CTA bilaterally CV: RRR without murmur or gallop Abd: soft, NT, Positive BS, obese Ext: no C/C/E, distal pulses intact and equal Skin: warm/dry no rash  Lab Results:  Basename 09/15/12 1330  WBC 3.7*  HGB 10.1*  PLT 235    Basename 09/16/12 0615 09/15/12 0530  NA 135 135  K 4.4 3.8  CL 100 102  CO2 24 23  GLUCOSE 208* 167*  BUN 27* 29*  CREATININE 0.94 1.01   No results found for this basename: TROPONINI:2,CK,MB:2 in the last 72 hours  Cardiac Studies: 2-D echo pending  Tele: Sinus rhythm, no significant arrhythmia.  Assessment/Plan:  1. Inferolateral STEMI: Patient status post PCI of the right coronary artery, but he presented late into the course of his infarct. He should continue aspirin and effient for a minimum of one year. The patient is statin intolerant.  2. Acute systolic heart failure: He is improved after intravenous furosemide. Now he has recovered from contrast-induced nephropathy, he should be started back on Diovan with close followup of his renal function. He's on metoprolol 25 mg twice daily. Await 2-D echocardiogram.  3. Contrast nephropathy with acute kidney injury. Creatinine essentially back to baseline. Resume ACE inhibitor. Should have a  followup metabolic panel next week.  Disposition: Home today. Discharge medications should include aspirin 81 mg, effient 10 mg, Diovan 320 mg, and metoprolol 25 mg twice daily, and furosemide 80 mg daily. He's been intolerant to statin drugs but we should consider a trial of low-dose Crestor 5 mg daily if he is willing. The patient should followup with Dr. Lattie Haw in reasonable and I would recommend a transition of care visit considering his acute myocardial infarction complicated by congestive heart failure.  [name], M.D. 09/16/2012, 9:57 AM

## 2012-09-16 NOTE — Progress Notes (Addendum)
CARDIAC REHAB PHASE I   PRE:  Rate/Rhythm: 70 SR  BP:  Supine:   Sitting: 132/66  Standing:    SaO2: 99 RA  MODE:  Ambulation: 890 ft   POST:  Rate/Rhythem: 76  BP:  Supine:   Sitting: 126/70  Standing:    SaO2: 100 RA 0930-1005 Pt tolerated ambulation well. No c/o of cp or SOB with walking, only c/o was of leg pain from neuropathy per pt. When questioned pt states that he felt a little SOB with walking. Pt to recliner after walk with call light in reach.  Deon Pilling

## 2012-09-16 NOTE — Progress Notes (Signed)
Pt given discharge instructions and reviewed with patient. Will discharge home with son. Elonzo Sopp, Bettina Gavia RN

## 2012-09-16 NOTE — Discharge Summary (Signed)
Patient ID: Eugene [name],  MRN: [id number], DOB/AGE: 56-Jul-1957 56 y.o.  Admit date: 09/08/2012 Discharge date: 09/16/2012  Primary Care Provider: [facility] Primary Cardiologist: R. Rothbart, MD  Discharge Diagnoses Principal Problem:  *ST elevation myocardial infarction (STEMI) of inferolateral wall, initial episode of care  **S/P PCI and DES placement to the RCA this admission. Active Problems:  CAD  DM type 2 (diabetes mellitus, type 2)  Hyperlipidemia  **Intolerant to statins.  C. difficile colitis  **Treated with Flagyl this admission.  ARF (acute renal failure)/Contrast Induced Nephropathy - resolved.  Acute diastolic CHF (congestive heart failure) in setting of renal failure and oligura  **Normal LV function by Echo this admission.  Obstructive Sleep Apnea  HTN  GERD  Allergies Allergies  Allergen Reactions  . Hydrocodone Nausea And Vomiting  . Statins Other (See Comments)    Muscle aches   Procedures  Cardiac Catheterization and Percutaneous Coronary Intervention 09/08/2012  PROCEDURAL FINDINGS Hemodynamics: AO 138/80 LV 136/26              Coronary angiography: Coronary dominance: right  Left mainstem: Patent with minor irregularity. No significant stenosis Left anterior descending (LAD): Diffuse disease in the proximal and mid vessel but no severe stenoses present. 50% proximal LAD stenosis, 50% diagonal stenosis (D1), diffuse distal vessel disease Left circumflex (LCx): Widely patent, very large vessel. OM1 is large with mild nonobstructive disease 25% stenosis. Right coronary artery (RCA): Proximal vessel 30-40% stenosis within previously implanted stent. Distal vessel 100% occluded prior to stented segment in PDA   **This area was stented with a 2.75 x 38 mm Promus Premier DES.  Left ventriculography: Left ventricular systolic function is  Moderately depressed with severe hypokinesis of the inferior wall. The LVEF is estimated at  45%. _____________  2D Echocardiogram 09/15/2012  Study Conclusions  - Left ventricle: The cavity size was normal. Wall thickness   was increased in a pattern of moderate LVH. Systolic   function was normal. The estimated ejection fraction was   in the range of 55% to 60%. There is hypokinesis of the   basal inferoposterior myocardium. Doppler parameters are   consistent with restrictive physiology, indicative of   decreased left ventricular diastolic compliance and/or   increased left atrial pressure. - Left atrium: The atrium was moderately dilated. - Pericardium, extracardiac: A trivial pericardial effusion   was identified. _____________  History of Present Illness  57 year old male with prior history of coronary artery disease who was in his usual state of health until approximately 3 days prior to admission when he began to experience intermittent, nagging, lower chest and epigastric discomfort. In that setting he also began to note dyspnea exertion mild orthopnea and also diarrhea. On the night prior to admission, he awoke with diaphoresis and dyspnea which persisted and prompted him to present to the [facility] emergency department on January 13 where ECG showed inferolateral ST segment elevation. History with aspirin, Lopressor, nitroglycerin, and heparin and code STEMI was activated. Patient was transferred to [facility] cone for further evaluation and emergent catheterization.  Hospital Course  Patient underwent emergent catheterization revealing total occlusion of the distal right coronary artery. Stents in the proximal right coronary artery and PDA were patent. The distal right coronary artery was felt to be the infarct vessel and this was successfully stented using a 2.75 x 38 mm Promus Premier DES. He was loaded with effient during the procedure and transferred to the coronary intensive care unit for further evaluation. There, he continued  to have mild chest aching with  persistent ST segment elevation. Eventually peaked his troponin at 9.29. Films were reviewed it was felt that his ongoing discomfort was not consistent with acute restenosis or in-stent thrombosis. He was maintained on beta blocker, aspirin, and effient therapy. Statin therapy was not initiated secondary to prior intolerance.  January 14, patient was noted to have a ventricular trigeminy and also complained of dyspnea with evidence of some volume overload on exam. He was treated with intravenous Lasix and follow up basic metabolic panel and magnesium were obtained. He also was noted to have a low-grade fever and there was concern for possible pneumonia and antibiotics were started. His creatinine returned at 2.44. Subsequent creatinine on the morning of January 15 was higher at 3.07 and urine output was noted to be sluggish. Angiotensin receptor blocker therapy was discontinued and nephrology was consulted. He was not felt to require dialysis and followup basic metabolic panel on the morning of January 16 showed persistent elevation of creatinine (4.07) and oliguria.  Further, patient's weight increased from 243 lbs on 1/13 to a max of 255 lbs on 1/16 with persistent evidence of volume overload. Lasix was switched to IV and Foley catheter was placed for strict intakes and outputs. With this switch, urine output improved as did creatinine and weight returned to baseline. It was felt that acute renal failure was likely secondary to contrast-induced nephropathy and possible acute tubular necrosis in the setting of mild hypotension and ongoing ARB therapy. Now the creatinine has normalized at 0.94 plan to resume ARB therapy and arrange for a followup basic metabolic panel next week as an outpatient.   Of note, in the setting of antibiotic therapy for low-grade fever, patient's diarrhea worsened. On January 17 and buttocks were discontinued as chest x-ray did not clearly show pneumonia although low-grade fever  persisted. Stool studies were sent off and returned positive first C. difficile which likely explains low-grade fever. Patient was placed on Flagyl therapy and also contact precautions. Internal medicine was consulted for guidance.  With improvement in diarrhea, renal function, and dyspnea patient has been able to ambulate with cardiac rehabilitation. He has not had any recurrence of chest discomfort since transfer to the floor. We plan to discharge him home today in good condition.  Discharge Vitals Blood pressure 130/70, pulse 68, temperature 98 F (36.7 C), temperature source Oral, resp. rate 18, height 6\' 1"  (1.854 m), weight 247 lb 2.2 oz (112.1 kg), SpO2 98.00%.  Filed Weights   09/14/12 0500 09/15/12 0435 09/16/12 0500  Weight: 244 lb 9.6 oz (110.95 kg) 245 lb 4.8 oz (111.267 kg) 247 lb 2.2 oz (112.1 kg)   Labs  CBC  Basename 09/15/12 1330  WBC 3.7*  NEUTROABS --  HGB 10.1*  HCT 29.3*  MCV 85.2  PLT [id number]   Basic Metabolic Panel  Basename [id number] 0615 09/15/12 0530  NA 135 135  K 4.4 3.8  CL 100 102  CO2 24 23  GLUCOSE 208* 167*  BUN 27* 29*  CREATININE 0.94 1.01  CALCIUM 8.9 8.6  MG -- --  PHOS 3.3 3.6   Liver Function Tests  Basename 09/16/12 0615 09/15/12 0530  AST -- --  ALT -- --  ALKPHOS -- --  BILITOT -- --  PROT -- --  ALBUMIN 2.6* 2.3*   Cardiac Enzymes Lab Results  Component Value Date   TROPONINI 8.44* 09/09/2012   Fasting Lipid Panel Lab Results  Component Value Date   CHOL 229* 09/09/2012  HDL 36* 09/09/2012   LDLCALC 117* 09/09/2012   TRIG 381* 09/09/2012   CHOLHDL 6.4 09/09/2012   Disposition  Pt is being discharged home today in good condition.  Follow-up Plans & Appointments  Follow-up Information    Follow up with Jory Sims, NP. On 09/23/2012. (1:20 PM - we will check a blood chemistry that day.)    Contact information:   [place] [address] [phone]       Follow up with [name], MD. (as  scheduled.)    Contact information:   [place] [address] [phone]         Discharge Medications    Medication List     As of 09/16/2012  3:17 PM    TAKE these medications         ALPRAZolam 0.5 MG tablet   Commonly known as: XANAX   Take 0.5 mg by mouth at bedtime as needed. Sleep aid      aspirin EC 81 MG tablet   Take 81 mg by mouth at bedtime.      benzonatate 100 MG capsule   Commonly known as: TESSALON   Take 1 capsule (100 mg total) by mouth 3 (three) times daily.      budesonide-formoterol 160-4.5 MCG/ACT inhaler   Commonly known as: SYMBICORT   Inhale 2 puffs into the lungs 2 (two) times daily.      doxazosin 4 MG tablet   Commonly known as: CARDURA   Take 1 tablet (4 mg total) by mouth at bedtime.      Flaxseed Oil 1000 MG Caps   Take 2,000 mg by mouth daily.      furosemide 40 MG tablet   Commonly known as: LASIX   Take 40 mg twice a day      insulin aspart 100 UNIT/ML injection   Commonly known as: novoLOG   Inject 3-25 Units into the skin 2 (two) times daily. SLIDING SCALE  NOVOLOG INSULIN BEFORE BREAKFAST AND BEFORE DINNER:   FOR A BLOOD SUGAR OF 70-120 GIVE 0 UNITS.   FOR BLOOD SUGAR OF 121-150 GIVE 3 UNITS.   FOR BLOOD SUGAR OF 151-200 GIVE 4 UNITS.   FOR BLOOD SUGAR 201-250 GIVE 7 UNITS.   FOR BLOOD SUGAR 251-300 GIVE 11 UNITS.   FOR BLOOD SUGAR 301-350 GIVE 15 UNITS.   FOR BLOOD SUGAR 351-400 GIVE 20 UNITS.    FOR BLOOD SUGAR OVER 400 GIVE 25 UNITS AND CALL YOUR DOCTOR.      insulin glargine 100 UNIT/ML injection   Commonly known as: LANTUS   Inject 75 Units into the skin at bedtime.      metoprolol tartrate 25 MG tablet   Commonly known as: LOPRESSOR   Take 1 tablet (25 mg total) by mouth 2 (two) times daily.      metroNIDAZOLE 500 MG tablet   Commonly known as: FLAGYL   Take 1 tablet (500 mg total) by mouth every 8 (eight) hours.      nitroGLYCERIN 0.4 MG SL tablet   Commonly known as: NITROSTAT   Place 1 tablet  (0.4 mg total) under the tongue every 5 (five) minutes x 3 doses as needed for chest pain.      pantoprazole 40 MG tablet   Commonly known as: PROTONIX   Take 1 tablet (40 mg total) by mouth 2 (two) times daily before a meal.      prasugrel 10 MG Tabs   Commonly known  as: EFFIENT   Take 1 tablet (10 mg total) by mouth daily.      PROAIR HFA 108 (90 BASE) MCG/ACT inhaler   Generic drug: albuterol   Inhale 2 puffs into the lungs every 6 (six) hours as needed. For shortness of breath      promethazine 25 MG tablet   Commonly known as: PHENERGAN   Take 1 tablet (25 mg total) by mouth every 6 (six) hours as needed for nausea.      valsartan 320 MG tablet   Commonly known as: DIOVAN   Take 320 mg by mouth daily.      Outstanding Labs/Studies  BMET upon office f/u next week.  Duration of Discharge Encounter   Greater than 30 minutes including physician time.  Signed, Murray Hodgkins NP 09/16/2012, 3:17 PM

## 2012-09-18 ENCOUNTER — Other Ambulatory Visit: Payer: Self-pay | Admitting: *Deleted

## 2012-09-23 ENCOUNTER — Encounter: Payer: PRIVATE HEALTH INSURANCE | Admitting: Adult Health

## 2012-09-29 ENCOUNTER — Ambulatory Visit (INDEPENDENT_AMBULATORY_CARE_PROVIDER_SITE_OTHER): Payer: PRIVATE HEALTH INSURANCE | Admitting: Adult Health

## 2012-09-29 ENCOUNTER — Encounter: Payer: Self-pay | Admitting: Adult Health

## 2012-09-29 VITALS — BP 148/82 | HR 84 | Ht 73.0 in | Wt 244.0 lb

## 2012-09-29 DIAGNOSIS — I10 Essential (primary) hypertension: Secondary | ICD-10-CM

## 2012-09-29 DIAGNOSIS — I7090 Unspecified atherosclerosis: Secondary | ICD-10-CM

## 2012-09-29 DIAGNOSIS — I2510 Atherosclerotic heart disease of native coronary artery without angina pectoris: Secondary | ICD-10-CM

## 2012-09-29 DIAGNOSIS — N179 Acute kidney failure, unspecified: Secondary | ICD-10-CM

## 2012-09-29 DIAGNOSIS — I509 Heart failure, unspecified: Secondary | ICD-10-CM

## 2012-09-29 DIAGNOSIS — I5021 Acute systolic (congestive) heart failure: Secondary | ICD-10-CM

## 2012-09-29 NOTE — Assessment & Plan Note (Signed)
He continues to have chronic LEE, and is avoiding salt. Cath report demonstrates EF of 45%. Will recheck echo in 3 months with current medical management.

## 2012-09-29 NOTE — Assessment & Plan Note (Signed)
Repeat BMET this week.

## 2012-09-29 NOTE — Patient Instructions (Addendum)
Your physician recommends that you schedule a follow-up appointment in: 3 months  Referral has been made to Cardiac Rehab.  If you do not hear from them by weeks end, call [phone].

## 2012-09-29 NOTE — Progress Notes (Deleted)
Name: Eugene [name]    DOB: 1956-05-08  Age: 57 y.o.  MR#: [id number]       PCP:  [name], MD      Insurance: @PAYORNAME @   CC:   No chief complaint on file.   VS BP 148/82  Pulse 84  Ht 6\' 1"  (1.854 m)  Wt 244 lb (110.678 kg)  BMI 32.19 kg/m2  Weights Current Weight  09/29/12 244 lb (110.678 kg)  09/16/12 247 lb 2.2 oz (112.1 kg)  09/16/12 247 lb 2.2 oz (112.1 kg)    Blood Pressure  BP Readings from Last 3 Encounters:  09/29/12 148/82  09/16/12 130/70  09/16/12 130/70     Admit date:  (Not on file) Last encounter with RMR:  Visit date not found   Allergy Allergies  Allergen Reactions  . Hydrocodone Nausea And Vomiting  . Statins Other (See Comments)    Muscle aches    Current Outpatient Prescriptions  Medication Sig Dispense Refill  . albuterol (PROAIR HFA) 108 (90 BASE) MCG/ACT inhaler Inhale 2 puffs into the lungs every 6 (six) hours as needed. For shortness of breath      . ALPRAZolam (XANAX) 0.5 MG tablet Take 0.5 mg by mouth at bedtime as needed. Sleep aid      . aspirin EC 81 MG tablet Take 81 mg by mouth at bedtime.       . benzonatate (TESSALON) 100 MG capsule Take 1 capsule (100 mg total) by mouth 3 (three) times daily.  20 capsule  1  . budesonide-formoterol (SYMBICORT) 160-4.5 MCG/ACT inhaler Inhale 2 puffs into the lungs 2 (two) times daily.      [name] doxazosin (CARDURA) 4 MG tablet Take 1 tablet (4 mg total) by mouth at bedtime.  30 tablet  11  . Flaxseed, Linseed, (FLAXSEED OIL) 1000 MG CAPS Take 2,000 mg by mouth daily.       . furosemide (LASIX) 40 MG tablet Take 40 mg twice a day  60 tablet  6  . insulin aspart (NOVOLOG) 100 UNIT/ML injection Inject 3-25 Units into the skin 2 (two) times daily. SLIDING SCALE  NOVOLOG INSULIN BEFORE BREAKFAST AND BEFORE DINNER:   FOR A BLOOD SUGAR OF 70-120 GIVE 0 UNITS.   FOR BLOOD SUGAR OF 121-150 GIVE 3 UNITS.   FOR BLOOD SUGAR OF 151-200 GIVE 4 UNITS.   FOR BLOOD SUGAR 201-250 GIVE 7 UNITS.   FOR BLOOD SUGAR  251-300 GIVE 11 UNITS.   FOR BLOOD SUGAR 301-350 GIVE 15 UNITS.   FOR BLOOD SUGAR 351-400 GIVE 20 UNITS.    FOR BLOOD SUGAR OVER 400 GIVE 25 UNITS AND CALL YOUR DOCTOR.      [name] insulin glargine (LANTUS) 100 UNIT/ML injection Inject 75 Units into the skin at bedtime.      . metoprolol tartrate (LOPRESSOR) 25 MG tablet Take 1 tablet (25 mg total) by mouth 2 (two) times daily.  60 tablet  6  . nitroGLYCERIN (NITROSTAT) 0.4 MG SL tablet Place 1 tablet (0.4 mg total) under the tongue every 5 (five) minutes x 3 doses as needed for chest pain.  25 tablet  3  . pantoprazole (PROTONIX) 40 MG tablet Take 1 tablet (40 mg total) by mouth 2 (two) times daily before a meal.  60 tablet  2  . prasugrel (EFFIENT) 10 MG TABS Take 1 tablet (10 mg total) by mouth daily.  30 tablet  6  . promethazine (PHENERGAN) 25 MG tablet Take 1 tablet (25 mg total) by  mouth every 6 (six) hours as needed for nausea.  12 tablet  0  . valsartan (DIOVAN) 320 MG tablet Take 320 mg by mouth daily.        Discontinued Meds:    Medications Discontinued During This Encounter  Medication Reason  . metroNIDAZOLE (FLAGYL) 500 MG tablet Error    Patient Active Problem List  Diagnosis  . DM type 2 (diabetes mellitus, type 2)  . Hypertension  . Cholelithiasis  . Arteriosclerotic cardiovascular disease (ASCVD)  . Hyperlipidemia  . Obstructive sleep apnea  . Hyponatremia  . Peripheral neuropathy  . Noncompliance  . Gastroesophageal reflux disease  . Anemia, normocytic normochromic  . Edema  . Cholelithiasis  . C. difficile colitis  . ST elevation myocardial infarction (STEMI) of inferolateral wall, initial episode of care  . ARF (acute renal failure)  . Acute systolic CHF (congestive heart failure), NYHA class 3  . CAD (coronary artery disease)  . Acute diastolic CHF (congestive heart failure)    LABS Admission on 09/08/2012, Discharged on 09/16/2012  No results displayed because visit has over 200 results.    Office Visit  on 08/18/2012  Component Date Value  . Color, Urine 08/18/2012 YELLOW   . APPearance 08/18/2012 CLEAR   . Specific Gravity, Urine 08/18/2012 1.025   . pH 08/18/2012 5.5   . Glucose, UA 08/18/2012 > 1000*  . Bilirubin Urine 08/18/2012 NEG   . Ketones, ur 08/18/2012 NEG   . Hgb urine dipstick 08/18/2012 SMALL*  . Protein, ur 08/18/2012 100*  . Urobilinogen, UA 08/18/2012 0.2   . Nitrite 08/18/2012 NEG   . Leukocytes, UA 08/18/2012 NEG   . TSH 08/18/2012 1.753   . Iron 08/18/2012 62   . UIBC 08/18/2012 215   . TIBC 08/18/2012 277   . %SAT 08/18/2012 22   . Ferritin 08/18/2012 307   . Sodium 08/18/2012 134*  . Potassium 08/18/2012 4.6   . Chloride 08/18/2012 99   . CO2 08/18/2012 27   . Glucose, Bld 08/18/2012 343*  . BUN 08/18/2012 29*  . Creat 08/18/2012 1.05   . Total Bilirubin 08/18/2012 0.4   . Alkaline Phosphatase 08/18/2012 75   . AST 08/18/2012 15   . ALT 08/18/2012 14   . Total Protein 08/18/2012 6.2   . Albumin 08/18/2012 3.6   . Calcium 08/18/2012 9.5   . WBC 08/18/2012 5.5   . RBC 08/18/2012 3.98*  . Hemoglobin 08/18/2012 12.1*  . HCT 08/18/2012 35.0*  . MCV 08/18/2012 87.9   . [facility] 08/18/2012 30.4   . MCHC 08/18/2012 34.6   . RDW 08/18/2012 14.0   . Platelets 08/18/2012 180   . Squamous Epithelial / LPF 08/18/2012 NONE SEEN   . Crystals 08/18/2012 NONE SEEN   . Casts 08/18/2012 NONE SEEN   . WBC, UA 08/18/2012 0-2   . RBC / HPF 08/18/2012 0-2   . Bacteria, UA 08/18/2012 NONE SEEN   Telephone on 08/01/2012  Component Date Value  . Sodium 08/11/2012 136   . Potassium 08/11/2012 5.1   . Chloride 08/11/2012 97   . CO2 08/11/2012 28   . Glucose, Bld 08/11/2012 382*  . BUN 08/11/2012 33*  . Creat 08/11/2012 0.95   . Calcium 08/11/2012 9.3   Telephone on 07/22/2012  Component Date Value  . Sodium 07/29/2012 138   . Potassium 07/29/2012 5.2   . Chloride 07/29/2012 104   . CO2 07/29/2012 26   . Glucose, Bld 07/29/2012 218*  . BUN 07/29/2012 35*  .  Creat 07/29/2012 0.87   . Total Bilirubin 07/29/2012 0.5   . Alkaline Phosphatase 07/29/2012 60   . AST 07/29/2012 17   . ALT 07/29/2012 17   . Total Protein 07/29/2012 6.0   . Albumin 07/29/2012 3.4*  . Calcium 07/29/2012 9.1   . WBC 07/29/2012 4.5   . RBC 07/29/2012 3.81*  . Hemoglobin 07/29/2012 11.4*  . HCT 07/29/2012 32.7*  . MCV 07/29/2012 85.8   . [facility] 07/29/2012 29.9   . MCHC 07/29/2012 34.9   . RDW 07/29/2012 14.1   . Platelets 07/29/2012 166   . Brain Natriuretic Peptide 07/29/2012 12.4      Results for this Opt Visit:     Results for orders placed during the hospital encounter of 09/08/12  CBC WITH DIFFERENTIAL      Component Value Range   WBC 5.4  4.0 - 10.5 K/uL   RBC 3.57 (*) 4.22 - 5.81 MIL/uL   Hemoglobin 10.8 (*) 13.0 - 17.0 g/dL   HCT 30.1 (*) 39.0 - 52.0 %   MCV 84.3  78.0 - 100.0 fL   MCH 30.3  26.0 - 34.0 pg   MCHC 35.9  30.0 - 36.0 g/dL   RDW 12.8  11.5 - 15.5 %   Platelets 169  150 - 400 K/uL   Neutrophils Relative 63  43 - 77 %   Neutro Abs 3.4  1.7 - 7.7 K/uL   Lymphocytes Relative 24  12 - 46 %   Lymphs Abs 1.3  0.7 - 4.0 K/uL   Monocytes Relative 11  3 - 12 %   Monocytes Absolute 0.6  0.1 - 1.0 K/uL   Eosinophils Relative 2  0 - 5 %   Eosinophils Absolute 0.1  0.0 - 0.7 K/uL   Basophils Relative 0  0 - 1 %   Basophils Absolute 0.0  0.0 - 0.1 K/uL  COMPREHENSIVE METABOLIC PANEL      Component Value Range   Sodium 132 (*) 135 - 145 mEq/L   Potassium 4.2  3.5 - 5.1 mEq/L   Chloride 96  96 - 112 mEq/L   CO2 26  19 - 32 mEq/L   Glucose, Bld 352 (*) 70 - 99 mg/dL   BUN 27 (*) 6 - 23 mg/dL   Creatinine, Ser 0.97  0.50 - 1.35 mg/dL   Calcium 9.4  8.4 - 10.5 mg/dL   Total Protein 6.6  6.0 - 8.3 g/dL   Albumin 2.7 (*) 3.5 - 5.2 g/dL   AST 20  0 - 37 U/L   ALT 15  0 - 53 U/L   Alkaline Phosphatase 77  39 - 117 U/L   Total Bilirubin 0.5  0.3 - 1.2 mg/dL   GFR calc non Af Amer >90  >90 mL/min   GFR calc Af Amer >90  >90 mL/min  LIPASE, BLOOD       Component Value Range   Lipase 11  11 - 59 U/L  TROPONIN I      Component Value Range   Troponin I 4.04 (*) <0.30 ng/mL  POCT I-STAT TROPONIN I      Component Value Range   Troponin i, poc 4.05 (*) 0.00 - 0.08 ng/mL   Comment 3           CBC      Component Value Range   WBC 5.4  4.0 - 10.5 K/uL   RBC 3.44 (*) 4.22 - 5.81 MIL/uL   Hemoglobin 10.2 (*) 13.0 - 17.0  g/dL   HCT 29.1 (*) 39.0 - 52.0 %   MCV 84.6  78.0 - 100.0 fL   MCH 29.7  26.0 - 34.0 pg   MCHC 35.1  30.0 - 36.0 g/dL   RDW 13.1  11.5 - 15.5 %   Platelets 159  150 - 400 K/uL  BASIC METABOLIC PANEL      Component Value Range   Sodium 134 (*) 135 - 145 mEq/L   Potassium 3.9  3.5 - 5.1 mEq/L   Chloride 99  96 - 112 mEq/L   CO2 25  19 - 32 mEq/L   Glucose, Bld 282 (*) 70 - 99 mg/dL   BUN 29 (*) 6 - 23 mg/dL   Creatinine, Ser 1.28  0.50 - 1.35 mg/dL   Calcium 8.9  8.4 - 10.5 mg/dL   GFR calc non Af Amer 61 (*) >90 mL/min   GFR calc Af Amer 71 (*) >90 mL/min  TROPONIN I      Component Value Range   Troponin I 4.20 (*) <0.30 ng/mL  TROPONIN I      Component Value Range   Troponin I 9.29 (*) <0.30 ng/mL  TROPONIN I      Component Value Range   Troponin I 8.44 (*) <0.30 ng/mL  MRSA PCR SCREENING      Component Value Range   MRSA by PCR NEGATIVE  NEGATIVE  LIPID PANEL      Component Value Range   Cholesterol 229 (*) 0 - 200 mg/dL   Triglycerides 381 (*) <150 mg/dL   HDL 36 (*) >39 mg/dL   Total CHOL/HDL Ratio 6.4     VLDL 76 (*) 0 - 40 mg/dL   LDL Cholesterol 117 (*) 0 - 99 mg/dL  GLUCOSE, CAPILLARY      Component Value Range   Glucose-Capillary 268 (*) 70 - 99 mg/dL  GLUCOSE, CAPILLARY      Component Value Range   Glucose-Capillary 227 (*) 70 - 99 mg/dL  GLUCOSE, CAPILLARY      Component Value Range   Glucose-Capillary 271 (*) 70 - 99 mg/dL  BASIC METABOLIC PANEL      Component Value Range   Sodium 134 (*) 135 - 145 mEq/L   Potassium 3.9  3.5 - 5.1 mEq/L   Chloride 97  96 - 112 mEq/L   CO2 24   19 - 32 mEq/L   Glucose, Bld 143 (*) 70 - 99 mg/dL   BUN 35 (*) 6 - 23 mg/dL   Creatinine, Ser 2.44 (*) 0.50 - 1.35 mg/dL   Calcium 9.0  8.4 - 10.5 mg/dL   GFR calc non Af Amer 28 (*) >90 mL/min   GFR calc Af Amer 33 (*) >90 mL/min  MAGNESIUM      Component Value Range   Magnesium 2.0  1.5 - 2.5 mg/dL  BASIC METABOLIC PANEL      Component Value Range   Sodium 136  135 - 145 mEq/L   Potassium 3.5  3.5 - 5.1 mEq/L   Chloride 99  96 - 112 mEq/L   CO2 25  19 - 32 mEq/L   Glucose, Bld 105 (*) 70 - 99 mg/dL   BUN 38 (*) 6 - 23 mg/dL   Creatinine, Ser 3.07 (*) 0.50 - 1.35 mg/dL   Calcium 8.8  8.4 - 10.5 mg/dL   GFR calc non Af Amer 21 (*) >90 mL/min   GFR calc Af Amer 25 (*) >90 mL/min  POCT ACTIVATED CLOTTING TIME  Component Value Range   Activated Clotting Time 328    GLUCOSE, CAPILLARY      Component Value Range   Glucose-Capillary 144 (*) 70 - 99 mg/dL  GLUCOSE, CAPILLARY      Component Value Range   Glucose-Capillary 139 (*) 70 - 99 mg/dL  GLUCOSE, CAPILLARY      Component Value Range   Glucose-Capillary 119 (*) 70 - 99 mg/dL  URINALYSIS, ROUTINE W REFLEX MICROSCOPIC      Component Value Range   Color, Urine AMBER (*) YELLOW   APPearance HAZY (*) CLEAR   Specific Gravity, Urine 1.025  1.005 - 1.030   pH 5.0  5.0 - 8.0   Glucose, UA 250 (*) NEGATIVE mg/dL   Hgb urine dipstick SMALL (*) NEGATIVE   Bilirubin Urine SMALL (*) NEGATIVE   Ketones, ur NEGATIVE  NEGATIVE mg/dL   Protein, ur 30 (*) NEGATIVE mg/dL   Urobilinogen, UA 0.2  0.0 - 1.0 mg/dL   Nitrite NEGATIVE  NEGATIVE   Leukocytes, UA NEGATIVE  NEGATIVE  CREATININE, URINE, RANDOM      Component Value Range   Creatinine, Urine 275.21    UREA NITROGEN, URINE      Component Value Range   Urea Nitrogen, Ur 234    SODIUM, URINE, RANDOM      Component Value Range   Sodium, Ur 11    GLUCOSE, CAPILLARY      Component Value Range   Glucose-Capillary 119 (*) 70 - 99 mg/dL  RENAL FUNCTION PANEL      Component  Value Range   Sodium 133 (*) 135 - 145 mEq/L   Potassium 3.9  3.5 - 5.1 mEq/L   Chloride 96  96 - 112 mEq/L   CO2 23  19 - 32 mEq/L   Glucose, Bld 168 (*) 70 - 99 mg/dL   BUN 48 (*) 6 - 23 mg/dL   Creatinine, Ser 4.07 (*) 0.50 - 1.35 mg/dL   Calcium 8.5  8.4 - 10.5 mg/dL   Phosphorus 5.5 (*) 2.3 - 4.6 mg/dL   Albumin 2.4 (*) 3.5 - 5.2 g/dL   GFR calc non Af Amer 15 (*) >90 mL/min   GFR calc Af Amer 18 (*) >90 mL/min  GLUCOSE, CAPILLARY      Component Value Range   Glucose-Capillary 134 (*) 70 - 99 mg/dL  URINE MICROSCOPIC-ADD ON      Component Value Range   Squamous Epithelial / LPF RARE  RARE   WBC, UA 0-2  <3 WBC/hpf   RBC / HPF 3-6  <3 RBC/hpf   Bacteria, UA RARE  RARE   Casts HYALINE CASTS (*) NEGATIVE   Urine-Other AMORPHOUS URATES/PHOSPHATES    GLUCOSE, CAPILLARY      Component Value Range   Glucose-Capillary 128 (*) 70 - 99 mg/dL   Comment 1 Documented in Chart     Comment 2 Notify RN    GLUCOSE, CAPILLARY      Component Value Range   Glucose-Capillary 165 (*) 70 - 99 mg/dL  GLUCOSE, CAPILLARY      Component Value Range   Glucose-Capillary 184 (*) 70 - 99 mg/dL  RENAL FUNCTION PANEL      Component Value Range   Sodium 134 (*) 135 - 145 mEq/L   Potassium 3.9  3.5 - 5.1 mEq/L   Chloride 98  96 - 112 mEq/L   CO2 22  19 - 32 mEq/L   Glucose, Bld 144 (*) 70 - 99 mg/dL   BUN 51 (*)  6 - 23 mg/dL   Creatinine, Ser 3.11 (*) 0.50 - 1.35 mg/dL   Calcium 8.7  8.4 - 10.5 mg/dL   Phosphorus 4.2  2.3 - 4.6 mg/dL   Albumin 2.8 (*) 3.5 - 5.2 g/dL   GFR calc non Af Amer 21 (*) >90 mL/min   GFR calc Af Amer 24 (*) >90 mL/min  GLUCOSE, CAPILLARY      Component Value Range   Glucose-Capillary 209 (*) 70 - 99 mg/dL  GLUCOSE, CAPILLARY      Component Value Range   Glucose-Capillary 137 (*) 70 - 99 mg/dL   Comment 1 Documented in Chart     Comment 2 Notify RN    GLUCOSE, CAPILLARY      Component Value Range   Glucose-Capillary 122 (*) 70 - 99 mg/dL  CLOSTRIDIUM DIFFICILE BY  PCR      Component Value Range   C difficile by pcr POSITIVE (*) NEGATIVE  GLUCOSE, CAPILLARY      Component Value Range   Glucose-Capillary 108 (*) 70 - 99 mg/dL  NOROVIRUS GROUP 1 & 2 BY PCR, STOOL      Component Value Range   Norovirus RNA, RT, PCR REPORT    GLUCOSE, CAPILLARY      Component Value Range   Glucose-Capillary 146 (*) 70 - 99 mg/dL  RENAL FUNCTION PANEL      Component Value Range   Sodium 136  135 - 145 mEq/L   Potassium 3.8  3.5 - 5.1 mEq/L   Chloride 98  96 - 112 mEq/L   CO2 22  19 - 32 mEq/L   Glucose, Bld 83  70 - 99 mg/dL   BUN 45 (*) 6 - 23 mg/dL   Creatinine, Ser 1.97 (*) 0.50 - 1.35 mg/dL   Calcium 8.9  8.4 - 10.5 mg/dL   Phosphorus 3.8  2.3 - 4.6 mg/dL   Albumin 2.6 (*) 3.5 - 5.2 g/dL   GFR calc non Af Amer 36 (*) >90 mL/min   GFR calc Af Amer 42 (*) >90 mL/min  CBC      Component Value Range   WBC 3.7 (*) 4.0 - 10.5 K/uL   RBC 3.17 (*) 4.22 - 5.81 MIL/uL   Hemoglobin 9.3 (*) 13.0 - 17.0 g/dL   HCT 27.2 (*) 39.0 - 52.0 %   MCV 85.8  78.0 - 100.0 fL   MCH 29.3  26.0 - 34.0 pg   MCHC 34.2  30.0 - 36.0 g/dL   RDW 12.8  11.5 - 15.5 %   Platelets 226  150 - 400 K/uL  GLUCOSE, CAPILLARY      Component Value Range   Glucose-Capillary 98  70 - 99 mg/dL   Comment 1 Documented in Chart     Comment 2 Notify RN    GLUCOSE, CAPILLARY      Component Value Range   Glucose-Capillary 81  70 - 99 mg/dL   Comment 1 Documented in Chart     Comment 2 Notify RN    VITAMIN B12      Component Value Range   Vitamin B-12 394  211 - 911 pg/mL  FOLATE      Component Value Range   Folate 10.5    IRON AND TIBC      Component Value Range   Iron 21 (*) 42 - 135 ug/dL   TIBC 166 (*) 215 - 435 ug/dL   Saturation Ratios 13 (*) 20 - 55 %   UIBC 145  125 - 400 ug/dL  FERRITIN      Component Value Range   Ferritin 448 (*) 22 - 322 ng/mL  RETICULOCYTES      Component Value Range   Retic Ct Pct 1.3  0.4 - 3.1 %   RBC. 3.01 (*) 4.22 - 5.81 MIL/uL   Retic Count,  Manual 39.1  19.0 - 186.0 K/uL  GLUCOSE, CAPILLARY      Component Value Range   Glucose-Capillary 128 (*) 70 - 99 mg/dL   Comment 1 Notify RN     Comment 2 Documented in Chart    GLUCOSE, CAPILLARY      Component Value Range   Glucose-Capillary 171 (*) 70 - 99 mg/dL   Comment 1 Notify RN     Comment 2 Documented in Chart    RENAL FUNCTION PANEL      Component Value Range   Sodium 137  135 - 145 mEq/L   Potassium 3.8  3.5 - 5.1 mEq/L   Chloride 101  96 - 112 mEq/L   CO2 25  19 - 32 mEq/L   Glucose, Bld 128 (*) 70 - 99 mg/dL   BUN 39 (*) 6 - 23 mg/dL   Creatinine, Ser 1.33  0.50 - 1.35 mg/dL   Calcium 9.0  8.4 - 10.5 mg/dL   Phosphorus 4.1  2.3 - 4.6 mg/dL   Albumin 2.5 (*) 3.5 - 5.2 g/dL   GFR calc non Af Amer 59 (*) >90 mL/min   GFR calc Af Amer 68 (*) >90 mL/min  GLUCOSE, CAPILLARY      Component Value Range   Glucose-Capillary 129 (*) 70 - 99 mg/dL   Comment 1 Documented in Chart     Comment 2 Notify RN    GLUCOSE, CAPILLARY      Component Value Range   Glucose-Capillary 137 (*) 70 - 99 mg/dL   Comment 1 Documented in Chart     Comment 2 Notify RN    GLUCOSE, CAPILLARY      Component Value Range   Glucose-Capillary 150 (*) 70 - 99 mg/dL   Comment 1 Notify RN     Comment 2 Documented in Chart    GLUCOSE, CAPILLARY      Component Value Range   Glucose-Capillary 181 (*) 70 - 99 mg/dL   Comment 1 Notify RN     Comment 2 Documented in Chart    RENAL FUNCTION PANEL      Component Value Range   Sodium 135  135 - 145 mEq/L   Potassium 3.8  3.5 - 5.1 mEq/L   Chloride 102  96 - 112 mEq/L   CO2 23  19 - 32 mEq/L   Glucose, Bld 167 (*) 70 - 99 mg/dL   BUN 29 (*) 6 - 23 mg/dL   Creatinine, Ser 1.01  0.50 - 1.35 mg/dL   Calcium 8.6  8.4 - 10.5 mg/dL   Phosphorus 3.6  2.3 - 4.6 mg/dL   Albumin 2.3 (*) 3.5 - 5.2 g/dL   GFR calc non Af Amer 82 (*) >90 mL/min   GFR calc Af Amer >90  >90 mL/min  GLUCOSE, CAPILLARY      Component Value Range   Glucose-Capillary 173 (*) 70 -  99 mg/dL  GLUCOSE, CAPILLARY      Component Value Range   Glucose-Capillary 136 (*) 70 - 99 mg/dL   Comment 1 Documented in Chart     Comment 2 Notify RN    CBC  Component Value Range   WBC 3.7 (*) 4.0 - 10.5 K/uL   RBC 3.44 (*) 4.22 - 5.81 MIL/uL   Hemoglobin 10.1 (*) 13.0 - 17.0 g/dL   HCT 29.3 (*) 39.0 - 52.0 %   MCV 85.2  78.0 - 100.0 fL   MCH 29.4  26.0 - 34.0 pg   MCHC 34.5  30.0 - 36.0 g/dL   RDW 12.8  11.5 - 15.5 %   Platelets 235  150 - 400 K/uL  GLUCOSE, CAPILLARY      Component Value Range   Glucose-Capillary 136 (*) 70 - 99 mg/dL   Comment 1 Notify RN    GLUCOSE, CAPILLARY      Component Value Range   Glucose-Capillary 145 (*) 70 - 99 mg/dL   Comment 1 Documented in Chart     Comment 2 Notify RN    RENAL FUNCTION PANEL      Component Value Range   Sodium 135  135 - 145 mEq/L   Potassium 4.4  3.5 - 5.1 mEq/L   Chloride 100  96 - 112 mEq/L   CO2 24  19 - 32 mEq/L   Glucose, Bld 208 (*) 70 - 99 mg/dL   BUN 27 (*) 6 - 23 mg/dL   Creatinine, Ser 0.94  0.50 - 1.35 mg/dL   Calcium 8.9  8.4 - 10.5 mg/dL   Phosphorus 3.3  2.3 - 4.6 mg/dL   Albumin 2.6 (*) 3.5 - 5.2 g/dL   GFR calc non Af Amer >90  >90 mL/min   GFR calc Af Amer >90  >90 mL/min  GLUCOSE, CAPILLARY      Component Value Range   Glucose-Capillary 185 (*) 70 - 99 mg/dL   Comment 1 Documented in Chart     Comment 2 Notify RN    GLUCOSE, CAPILLARY      Component Value Range   Glucose-Capillary 142 (*) 70 - 99 mg/dL   Comment 1 Notify RN    GLUCOSE, CAPILLARY      Component Value Range   Glucose-Capillary 130 (*) 70 - 99 mg/dL   Comment 1 Notify RN      EKG Orders placed in visit on 09/29/12  . EKG 12-LEAD     Prior Assessment and Plan Problem List as of 09/29/2012            Cardiology Problems   Hypertension   Last Assessment & Plan Note   08/18/2012 Office Visit Signed 08/18/2012  5:05 PM by Yehuda Savannah, MD    Blood pressure control has been suboptimal, but is now improved.   Verapamil may be contributing to peripheral edema and will be held for now.  Cardura dosage will be increased as necessary to prevent a loss of control of hypertension.    Arteriosclerotic cardiovascular disease (ASCVD)   Last Assessment & Plan Note   08/18/2012 Office Visit Signed 08/18/2012  5:02 PM by Yehuda Savannah, MD    No symptoms at present to suggest myocardial ischemia.  We will continue to focus on optimal control of cardiovascular risk factors.    Hyperlipidemia   Last Assessment & Plan Note   08/18/2012 Office Visit Signed 08/18/2012  5:03 PM by Yehuda Savannah, MD    Options for treatment are limited, as the patient has suffered adverse reactions to multiple lipid-lowering medications.  It is unclear why he was advised that fenofibrate is contraindicated.  We have asked him to bring the letter that he received for review.  ST elevation myocardial infarction (STEMI) of inferolateral wall, initial episode of care   Acute systolic CHF (congestive heart failure), NYHA class 3   CAD (coronary artery disease)   Acute diastolic CHF (congestive heart failure)     Other   DM type 2 (diabetes mellitus, type 2)   Last Assessment & Plan Note   08/23/2011 Office Visit Signed 08/23/2011  2:52 PM by Yehuda Savannah, MD    Patient is unaware of any recent hemoglobin A1c measurements.  We will seek recent laboratory results from his primary care physician.    Cholelithiasis   Obstructive sleep apnea   Last Assessment & Plan Note   08/23/2011 Office Visit Signed 08/23/2011  2:58 PM by Yehuda Savannah, MD    Patient has improved with nocturnal CPAP therapy and supplemental oxygen.  Based upon ABG in 05/2011 with pH of 7.38, pCO2 of 44 and pO2 of 56, he also has an element of hypoventilation at rest and substantial hypoxemia.  The contribution of his weight is uncertain, since his BMI is only approximately 30.  Dr. Luan Pulling is assessing for additional causes of hypoxemia.  He might  benefit from continuous oxygen therapy.     Hyponatremia   Last Assessment & Plan Note   10/24/2011 Office Visit Signed 10/28/2011  8:37 AM by Yehuda Savannah, MD    Sodium of 128 when measured a few days ago.  This is partially due to a concomitant blood sugar of 368.  Continued monitoring will be necessary.     Peripheral neuropathy   Last Assessment & Plan Note   10/24/2011 Office Visit Signed 10/24/2011  2:20 PM by Yehuda Savannah, MD    Risk of repetitive trauma or pressure in the lower extremities discussed with patient and suggestions provided as to how to avoid this as well as the need for him to monitor the condition of his skin on a daily basis.    Noncompliance   Gastroesophageal reflux disease   Anemia, normocytic normochromic   Last Assessment & Plan Note   08/18/2012 Office Visit Signed 08/18/2012  5:02 PM by Yehuda Savannah, MD    No prior laboratory evaluation apparently performed.  Iron studies and stool for Hemoccult testing will be obtained.    Edema   Last Assessment & Plan Note   08/18/2012 Office Visit Signed 08/18/2012  5:08 PM by Yehuda Savannah, MD    No etiology has been identified to account for his marked increase in peripheral edema.  He is on no particularly offensive drugs, but verapamil could be contributing and will be discontinued for now.  Further evaluation will include urinalysis, venous ultrasound of lower extremities and abdominal ultrasound.  He will monitor blood pressure at home.      Cholelithiasis   C. difficile colitis   ARF (acute renal failure)       Imaging: Dg Chest 1 View  09/08/2012  *RADIOLOGY REPORT*  Clinical Data: Chest pain  CHEST - 1 VIEW  Comparison: 06/16/2012  Findings: Low lung volumes.  Bibasilar atelectasis.  Allowing for low volumes, the heart is upper normal in size.  Pulmonary vascularity is within normal limits.  No Kerley B lines to suggest edema.  No pneumothorax.  No acute bony deformity.  IMPRESSION: Bibasilar  atelectasis.  No evidence of pulmonary edema.   Original Report Authenticated By: Marybelle Killings, M.D.    US Renal  09/10/2012  *RADIOLOGY REPORT*  Clinical Data: Acute renal insufficiency.  RENAL/URINARY TRACT  ULTRASOUND COMPLETE  Comparison:  Ultrasound dated 08/22/2012  Findings:  Right Kidney:  15.4 cm in length.  Small amount of perinephric fluid adjacent to the lower pole.  Increased risk echogenicity of the renal parenchyma since the prior exam.  Left Kidney:  16.8 cm in length. Echogenic renal parenchyma, increased since the prior study.  Bladder:  Normal.  IMPRESSION: Increased echogenicity of the renal parenchyma bilaterally, new since the prior exam, consistent with renal medical disease.  New small amount of perinephric fluid around the right kidney.  No obstruction.   Original Report Authenticated By: Lorriane Shire, M.D.    Dg Chest Port 1 View  09/15/2012  *RADIOLOGY REPORT*  Clinical Data: 57 year old male with shortness of breath, cough and congestion.  PORTABLE CHEST - 1 VIEW  Comparison: 09/09/2012 and earlier.  Findings: Portable semi upright AP view 1146 hours.  Continued low lung volumes.  Patchy bibasilar opacity, greater on the left, is stable.  No pneumothorax, pulmonary edema or definite pleural effusion.  Cardiac size and mediastinal contours are within normal limits.  IMPRESSION: Continued low lung volumes and stable patchy basilar opacity, greater on the left.   Original Report Authenticated By: Roselyn Reef, M.D.    Dg Chest Port 1 View  09/09/2012  *RADIOLOGY REPORT*  Clinical Data: Shortness of breath, cough  PORTABLE CHEST - 1 VIEW  Comparison: 09/08/2012  Findings: Cardiomegaly with pulmonary vascular congestion and possible mild interstitial edema. No pleural effusion or pneumothorax.  IMPRESSION: Cardiomegaly with pulmonary vascular congestion and possible mild interstitial edema.   Original Report Authenticated By: Julian Hy, M.D.      [facility] Calculation: Score not  calculated. Missing: Total Cholesterol

## 2012-09-29 NOTE — Progress Notes (Signed)
HPI: [name] is a 57 y/o patient of Dr.Rothbart we are seeing for ongoing assessment and treatment of  CAD with recent admission for STEMI of inferior/lateral wall in January of 2014, s/p PCI and DES placement to the RCA, using Promus Premier DES. He has a history of prior stent placement to the RCA. Symptoms for his MI were DOE, and orthopnea, but no chest pain. He had a lengthy hospitalization which included contrast induced nephropathy with Creatinine elevation to 3.07 and sluggish urine output, and also evidence of CHF. He was treated with diuretic. ARB was discontinued. Creatinine normalized to 0.94 on discharge. He was also treated for C-Diff due to low grade fever. He comes today without recurrence of symptoms, medically compliant, no bleeding issues, and is ready to proceed to cardiac rehab evaluation.   Allergies  Allergen Reactions  . Hydrocodone Nausea And Vomiting  . Statins Other (See Comments)    Muscle aches    Current Outpatient Prescriptions  Medication Sig Dispense Refill  . albuterol (PROAIR HFA) 108 (90 BASE) MCG/ACT inhaler Inhale 2 puffs into the lungs every 6 (six) hours as needed. For shortness of breath      . ALPRAZolam (XANAX) 0.5 MG tablet Take 0.5 mg by mouth at bedtime as needed. Sleep aid      . aspirin EC 81 MG tablet Take 81 mg by mouth at bedtime.       . benzonatate (TESSALON) 100 MG capsule Take 1 capsule (100 mg total) by mouth 3 (three) times daily.  20 capsule  1  . budesonide-formoterol (SYMBICORT) 160-4.5 MCG/ACT inhaler Inhale 2 puffs into the lungs 2 (two) times daily.      [name] doxazosin (CARDURA) 4 MG tablet Take 1 tablet (4 mg total) by mouth at bedtime.  30 tablet  11  . Flaxseed, Linseed, (FLAXSEED OIL) 1000 MG CAPS Take 2,000 mg by mouth daily.       . furosemide (LASIX) 40 MG tablet Take 40 mg twice a day  60 tablet  6  . insulin aspart (NOVOLOG) 100 UNIT/ML injection Inject 3-25 Units into the skin 2 (two) times daily. SLIDING SCALE   NOVOLOG INSULIN BEFORE BREAKFAST AND BEFORE DINNER:   FOR A BLOOD SUGAR OF 70-120 GIVE 0 UNITS.   FOR BLOOD SUGAR OF 121-150 GIVE 3 UNITS.   FOR BLOOD SUGAR OF 151-200 GIVE 4 UNITS.   FOR BLOOD SUGAR 201-250 GIVE 7 UNITS.   FOR BLOOD SUGAR 251-300 GIVE 11 UNITS.   FOR BLOOD SUGAR 301-350 GIVE 15 UNITS.   FOR BLOOD SUGAR 351-400 GIVE 20 UNITS.    FOR BLOOD SUGAR OVER 400 GIVE 25 UNITS AND CALL YOUR DOCTOR.      [name] insulin glargine (LANTUS) 100 UNIT/ML injection Inject 75 Units into the skin at bedtime.      . metoprolol tartrate (LOPRESSOR) 25 MG tablet Take 1 tablet (25 mg total) by mouth 2 (two) times daily.  60 tablet  6  . nitroGLYCERIN (NITROSTAT) 0.4 MG SL tablet Place 1 tablet (0.4 mg total) under the tongue every 5 (five) minutes x 3 doses as needed for chest pain.  25 tablet  3  . pantoprazole (PROTONIX) 40 MG tablet Take 1 tablet (40 mg total) by mouth 2 (two) times daily before a meal.  60 tablet  2  . prasugrel (EFFIENT) 10 MG TABS Take 1 tablet (10 mg total) by mouth daily.  30 tablet  6  . promethazine (PHENERGAN) 25 MG tablet Take 1  tablet (25 mg total) by mouth every 6 (six) hours as needed for nausea.  12 tablet  0  . valsartan (DIOVAN) 320 MG tablet Take 320 mg by mouth daily.        Past Medical History  Diagnosis Date  . Arteriosclerotic cardiovascular disease (ASCVD)     a. 05/2011 Cath/PCI: LM nl, LAD 2m, D1 small, D2 small 29m, LCX large 40p, RCA 50-60p, 99 hazy @ origin of PDA with 70-80 in PDA (2.5x26 Resolute Integrity & 3.0x15 Resolute Integrity DES).;  b. 08/2012 Inflat  STEMI/Cath/PCI: LM minor irregs, LAD 50p, D1 50, LCX nl, OM1 25, RCA 30-40p, 100d (treated with 2.75x96mm Promus Premier DES);  c. 08/2012 Echo: EF 55-60%, basal inferopost HK.  [name] Hyperlipidemia   . Diabetes mellitus     Peripheral neuropathy  . Bell palsy   . Hypertension   . COPD (chronic obstructive pulmonary disease)   . Sleep apnea   . Gallstones   . Nephrolithiasis   . Cholelithiasis 07/2012     Asymptomatic; identified incidentally  . PONV (postoperative nausea and vomiting)   . Anxiety   . C. difficile colitis     a. 08/2012  . Contrast dye induced nephropathy     a. 08/2012 post cath/pci    Past Surgical History  Procedure Date  . Circumcision   . Stents   . Esophagogastroduodenoscopy     in [place] over 20 yrs ago  . Colonoscopy     In [facility], approximately 2011 per patient, was normal. Advised to come back in 10 years.  . Esophagogastroduodenoscopy 06/12/2012    Procedure: ESOPHAGOGASTRODUODENOSCOPY (EGD);  Surgeon: Daneil Dolin, MD;  Location: [facility];  Service: Endoscopy;  Laterality: N/A;  9:45  . Cardiac catheterization     [id number] of systems complete and found to be negative unless listed above  PHYSICAL EXAM BP 148/82  Pulse 84  Ht 6\' 1"  (1.854 m)  Wt 244 lb (110.678 kg)  BMI 32.19 kg/m2  General: Well developed, well nourished, in no acute distress Head: Eyes PERRLA, No xanthomas.   Normal cephalic and atramatic  Lungs: Clear bilaterally to auscultation and percussion. Heart: HRRR S1 S2,  without MRG.  Pulses are 2+ & equal.            No carotid bruit. No JVD.  No abdominal bruits. No femoral bruits. Abdomen: Bowel sounds are positive, abdomen soft and non-tender without masses or                  Hernia's noted. Msk:  Back normal, normal gait. Normal strength and tone for age. Extremities: No clubbing, cyanosis, 1+ pitting pretibal edema edema.  DP +1 Neuro: Alert and oriented X 3. Psych:  Good affect, responds appropriately  EKG: (Jan 2014) Inferior MI with nonspecific lateral ST abnormalities. Rate of 84 bpm.  ASSESSMENT AND PLAN

## 2012-09-29 NOTE — Assessment & Plan Note (Signed)
He is taking valsartan as directed now, and will need to have BMET drawn for follow up in the setting of contrast induced nephropathy. He is compliant with medications and offers no complaints of DOE, or orthopnea. He denies bleeding issues or weakness. I will refer him to cardiac rehab for continued education and to increase exercise. Will see him again in 3 months unless he becomes symptomatic.

## 2012-09-29 NOTE — Assessment & Plan Note (Signed)
Blood pressure re-checked in the office and is 130/78. Will continue current medications and review follow up labs. He will be seen again in 3 months unless symptomatic.

## 2012-09-29 NOTE — Addendum Note (Signed)
Addended by: Suzanne Boron A on: 09/29/2012 03:53 PM   Modules accepted: Orders

## 2012-09-30 LAB — BASIC METABOLIC PANEL
BUN: 33 mg/dL — ABNORMAL HIGH (ref 6–23)
CO2: 27 mEq/L (ref 19–32)
Calcium: 8.8 mg/dL (ref 8.4–10.5)
Creat: 1.15 mg/dL (ref 0.50–1.35)
Glucose, Bld: 310 mg/dL — ABNORMAL HIGH (ref 70–99)

## 2012-10-01 NOTE — Addendum Note (Signed)
Addended by: Shara Blazing A on: 10/01/2012 12:05 PM   Modules accepted: Orders

## 2012-10-14 ENCOUNTER — Encounter (HOSPITAL_COMMUNITY): Payer: Self-pay

## 2012-10-14 ENCOUNTER — Encounter (HOSPITAL_COMMUNITY)
Admission: RE | Admit: 2012-10-14 | Discharge: 2012-10-14 | Disposition: A | Discharge status: Home / Self Care | Payer: PRIVATE HEALTH INSURANCE | Source: Ambulatory Visit | Attending: Cardiology | Admitting: Cardiology

## 2012-10-14 VITALS — BP 104/66 | HR 72 | Ht 73.0 in | Wt 248.8 lb

## 2012-10-14 DIAGNOSIS — Z955 Presence of coronary angioplasty implant and graft: Secondary | ICD-10-CM

## 2012-10-14 DIAGNOSIS — Z9861 Coronary angioplasty status: Secondary | ICD-10-CM | POA: Insufficient documentation

## 2012-10-14 DIAGNOSIS — Z5189 Encounter for other specified aftercare: Secondary | ICD-10-CM | POA: Insufficient documentation

## 2012-10-14 DIAGNOSIS — I214 Non-ST elevation (NSTEMI) myocardial infarction: Secondary | ICD-10-CM

## 2012-10-14 DIAGNOSIS — I252 Old myocardial infarction: Secondary | ICD-10-CM | POA: Insufficient documentation

## 2012-10-14 HISTORY — DX: Acute myocardial infarction, unspecified: I21.9

## 2012-10-14 NOTE — Patient Instructions (Signed)
Pt has finished orientation and is scheduled to start CR on 10/20/12 at 11:00. Pt has been instructed to arrive to class 15 minutes early for scheduled class. Pt has been instructed to wear comfortable clothing and shoes with rubber soles. Pt has been told to take their medications 1 hour prior to coming to class.  If the patient is not going to attend class, he/she has been instructed to call.

## 2012-10-14 NOTE — Progress Notes (Signed)
Patient referred by Dr. [name] due to Myocardial Infarction/Stent placement 410/90/V45.82. During orientation advised patient on arrival and appointment times what to wear, what to do before, during and after exercise. Reviewed attendance and class policy. Talked about inclement weather and class consultation policy. Pt is scheduled to start Cardiac Rehab on 10/20/12 at 11:00 am. Pt was advised to come to class 5 minutes before class starts. He was also given instructions on meeting with the dietician and attending the Family Structure classes. Pt is eager to get started. Patient did Bike test instead of Walk test due to Neuropathy.

## 2012-10-20 ENCOUNTER — Encounter (HOSPITAL_COMMUNITY)
Admission: RE | Admit: 2012-10-20 | Discharge: 2012-10-20 | Disposition: A | Discharge status: Home / Self Care | Payer: PRIVATE HEALTH INSURANCE | Source: Ambulatory Visit | Attending: Cardiology | Admitting: Cardiology

## 2012-10-21 ENCOUNTER — Emergency Department (HOSPITAL_COMMUNITY)
Admission: EM | Admit: 2012-10-21 | Discharge: 2012-10-21 | Disposition: A | Discharge status: Home / Self Care | Payer: PRIVATE HEALTH INSURANCE | Attending: Emergency Medicine | Admitting: Emergency Medicine

## 2012-10-21 ENCOUNTER — Emergency Department (HOSPITAL_COMMUNITY): Payer: PRIVATE HEALTH INSURANCE

## 2012-10-21 ENCOUNTER — Encounter (HOSPITAL_COMMUNITY): Payer: Self-pay

## 2012-10-21 ENCOUNTER — Other Ambulatory Visit: Payer: Self-pay

## 2012-10-21 DIAGNOSIS — Z9861 Coronary angioplasty status: Secondary | ICD-10-CM | POA: Insufficient documentation

## 2012-10-21 DIAGNOSIS — Z8669 Personal history of other diseases of the nervous system and sense organs: Secondary | ICD-10-CM | POA: Insufficient documentation

## 2012-10-21 DIAGNOSIS — I2510 Atherosclerotic heart disease of native coronary artery without angina pectoris: Secondary | ICD-10-CM | POA: Insufficient documentation

## 2012-10-21 DIAGNOSIS — G909 Disorder of the autonomic nervous system, unspecified: Secondary | ICD-10-CM | POA: Insufficient documentation

## 2012-10-21 DIAGNOSIS — I10 Essential (primary) hypertension: Secondary | ICD-10-CM | POA: Insufficient documentation

## 2012-10-21 DIAGNOSIS — R0602 Shortness of breath: Secondary | ICD-10-CM | POA: Insufficient documentation

## 2012-10-21 DIAGNOSIS — Z8719 Personal history of other diseases of the digestive system: Secondary | ICD-10-CM | POA: Insufficient documentation

## 2012-10-21 DIAGNOSIS — Z7982 Long term (current) use of aspirin: Secondary | ICD-10-CM | POA: Insufficient documentation

## 2012-10-21 DIAGNOSIS — R079 Chest pain, unspecified: Secondary | ICD-10-CM | POA: Insufficient documentation

## 2012-10-21 DIAGNOSIS — Z794 Long term (current) use of insulin: Secondary | ICD-10-CM | POA: Insufficient documentation

## 2012-10-21 DIAGNOSIS — E1149 Type 2 diabetes mellitus with other diabetic neurological complication: Secondary | ICD-10-CM | POA: Insufficient documentation

## 2012-10-21 DIAGNOSIS — G4730 Sleep apnea, unspecified: Secondary | ICD-10-CM | POA: Insufficient documentation

## 2012-10-21 DIAGNOSIS — F411 Generalized anxiety disorder: Secondary | ICD-10-CM | POA: Insufficient documentation

## 2012-10-21 DIAGNOSIS — I252 Old myocardial infarction: Secondary | ICD-10-CM | POA: Insufficient documentation

## 2012-10-21 DIAGNOSIS — E785 Hyperlipidemia, unspecified: Secondary | ICD-10-CM | POA: Insufficient documentation

## 2012-10-21 DIAGNOSIS — Z8679 Personal history of other diseases of the circulatory system: Secondary | ICD-10-CM | POA: Insufficient documentation

## 2012-10-21 DIAGNOSIS — Z79899 Other long term (current) drug therapy: Secondary | ICD-10-CM | POA: Insufficient documentation

## 2012-10-21 DIAGNOSIS — J4489 Other specified chronic obstructive pulmonary disease: Secondary | ICD-10-CM | POA: Insufficient documentation

## 2012-10-21 DIAGNOSIS — Z87442 Personal history of urinary calculi: Secondary | ICD-10-CM | POA: Insufficient documentation

## 2012-10-21 LAB — CBC
HCT: 30.9 % — ABNORMAL LOW (ref 39.0–52.0)
Hemoglobin: 11.2 g/dL — ABNORMAL LOW (ref 13.0–17.0)
MCH: 30.5 pg (ref 26.0–34.0)
MCHC: 36.2 g/dL — ABNORMAL HIGH (ref 30.0–36.0)
MCV: 84.2 fL (ref 78.0–100.0)
Platelets: 158 10*3/uL (ref 150–400)
RBC: 3.67 MIL/uL — ABNORMAL LOW (ref 4.22–5.81)
RDW: 13.6 % (ref 11.5–15.5)
WBC: 5.3 10*3/uL (ref 4.0–10.5)

## 2012-10-21 LAB — BASIC METABOLIC PANEL
BUN: 27 mg/dL — ABNORMAL HIGH (ref 6–23)
CO2: 26 mEq/L (ref 19–32)
Calcium: 9.3 mg/dL (ref 8.4–10.5)
Chloride: 97 mEq/L (ref 96–112)
Creatinine, Ser: 0.89 mg/dL (ref 0.50–1.35)
GFR calc Af Amer: >90 mL/min (ref >90)
GFR calc non Af Amer: >90 mL/min (ref >90)
Glucose, Bld: 155 mg/dL — ABNORMAL HIGH (ref 70–99)
Potassium: 4.2 mEq/L (ref 3.5–5.1)
Sodium: 131 mEq/L — ABNORMAL LOW (ref 135–145)

## 2012-10-21 LAB — TROPONIN I: Troponin I: <0.3 ng/mL (ref <0.30)

## 2012-10-21 MED ORDER — NITROGLYCERIN 2 % TD OINT
1.0000 [in_us] | TOPICAL_OINTMENT | Freq: Once | TRANSDERMAL | Status: AC
  Administered 2012-10-21: 1 [in_us] via TOPICAL
  Filled 2012-10-21: qty 1

## 2012-10-21 MED ORDER — ASPIRIN 81 MG PO CHEW
324.0000 mg | CHEWABLE_TABLET | Freq: Once | ORAL | Status: DC
  Filled 2012-10-21: qty 4

## 2012-10-21 NOTE — ED Provider Notes (Signed)
History  This chart was scribed for Virgel Manifold, MD, by Truddie Coco, ED Scribe. This patient was seen in room APA19/[facility] and the patient's care was started at 7:32 PM   CSN: [id number]  Arrival date & time 10/21/12  [id number]   First MD Initiated Contact with Patient 10/21/12 1914      Chief Complaint  Patient presents with  . Chest Pain     The history is provided by the patient. No language interpreter was used.   Eugene [name] is a 57 y.o. male who presents to the Emergency Department, BIBA, complaining of constant left sided chest pain that started about 22 hours ago.  Pt had previous MI about one month ago and states these sx are not similar as he did not really have pain with previous MI, only SOB.  He reports he is feeling associated pain to the back of the head.  Pt took four aspirin before coming to the ED.  Pt was given one nitro in route w/o change in symptoms.  Pt reports he started cardiac rehab yesterday.  He denies new swelling, SOB, lightheadedness, diaphoresis, or nausea presently.  Pt takes blood thinners.  He has stents in place. On effient. Reports compliance with his medications.        Cardiologist is Dr. Lattie Haw.  His next appointment is in three months.   Past Medical History  Diagnosis Date  . Arteriosclerotic cardiovascular disease (ASCVD)     a. 05/2011 Cath/PCI: LM nl, LAD 37m, D1 small, D2 small 15m, LCX large 40p, RCA 50-60p, 99 hazy @ origin of PDA with 70-80 in PDA (2.5x26 Resolute Integrity & 3.0x15 Resolute Integrity DES).;  b. 08/2012 Inflat  STEMI/Cath/PCI: LM minor irregs, LAD 50p, D1 50, LCX nl, OM1 25, RCA 30-40p, 100d (treated with 2.75x44mm Promus Premier DES);  c. 08/2012 Echo: EF 55-60%, basal inferopost HK.  [name] Hyperlipidemia   . Diabetes mellitus     Peripheral neuropathy  . Bell palsy   . Hypertension   . COPD (chronic obstructive pulmonary disease)   . Sleep apnea   . Gallstones   . Nephrolithiasis   . Cholelithiasis 07/2012     Asymptomatic; identified incidentally  . PONV (postoperative nausea and vomiting)   . Anxiety   . C. difficile colitis     a. 08/2012  . Contrast dye induced nephropathy     a. 08/2012 post cath/pci  . Myocardial infarct 09/08/12    Past Surgical History  Procedure Laterality Date  . Circumcision    . Stents    . Esophagogastroduodenoscopy      in [place] over 20 yrs ago  . Colonoscopy      In [facility], approximately 2011 per patient, was normal. Advised to come back in 10 years.  . Esophagogastroduodenoscopy  06/12/2012    Procedure: ESOPHAGOGASTRODUODENOSCOPY (EGD);  Surgeon: Daneil Dolin, MD;  Location: [facility];  Service: Endoscopy;  Laterality: N/A;  9:45  . Cardiac catheterization      Family History  Problem Relation Age of Onset  . Diabetes Mother   . Heart attack Mother   . Stroke Mother   . Diabetes Sister   . Sleep apnea Sister   . Hypertension Brother   . Diabetes Brother   . Colon cancer Neg Hx   . Liver disease Neg Hx   . Diabetes Brother   . Hypertension Brother     History  Substance Use Topics  . Smoking status: Never Smoker   .  Smokeless tobacco: Not on file  . Alcohol Use: No     Comment: heavy etoh use 30 years ago      Review of Systems  Constitutional: Negative for diaphoresis.  Respiratory: Negative for shortness of breath.   Cardiovascular: Positive for chest pain.  Gastrointestinal: Negative for nausea.  All other systems reviewed and are negative.    Allergies  Hydrocodone and Statins  Home Medications   Current Outpatient Rx  Name  Route  Sig  Dispense  Refill  . albuterol (PROAIR HFA) 108 (90 BASE) MCG/ACT inhaler   Inhalation   Inhale 2 puffs into the lungs every 6 (six) hours as needed. For shortness of breath         . ALPRAZolam (XANAX) 0.5 MG tablet   Oral   Take 0.5 mg by mouth at bedtime as needed. Sleep aid         . aspirin EC 81 MG tablet   Oral   Take 81 mg by mouth at bedtime.           . budesonide-formoterol (SYMBICORT) 160-4.5 MCG/ACT inhaler   Inhalation   Inhale 2 puffs into the lungs 2 (two) times daily.         [name] doxazosin (CARDURA) 4 MG tablet   Oral   Take 1 tablet (4 mg total) by mouth at bedtime.   30 tablet   11   . Flaxseed, Linseed, (FLAXSEED OIL) 1000 MG CAPS   Oral   Take 2,000 mg by mouth daily.          . furosemide (LASIX) 40 MG tablet      Take 40 mg twice a day   60 tablet   6   . insulin aspart (NOVOLOG) 100 UNIT/ML injection   Subcutaneous   Inject 3-25 Units into the skin 2 (two) times daily. SLIDING SCALE  NOVOLOG INSULIN BEFORE BREAKFAST AND BEFORE DINNER:   FOR A BLOOD SUGAR OF 70-120 GIVE 0 UNITS.   FOR BLOOD SUGAR OF 121-150 GIVE 3 UNITS.   FOR BLOOD SUGAR OF 151-200 GIVE 4 UNITS.   FOR BLOOD SUGAR 201-250 GIVE 7 UNITS.   FOR BLOOD SUGAR 251-300 GIVE 11 UNITS.   FOR BLOOD SUGAR 301-350 GIVE 15 UNITS.   FOR BLOOD SUGAR 351-400 GIVE 20 UNITS.    FOR BLOOD SUGAR OVER 400 GIVE 25 UNITS AND CALL YOUR DOCTOR.         [name] insulin glargine (LANTUS) 100 UNIT/ML injection   Subcutaneous   Inject 75 Units into the skin at bedtime.         . metoprolol tartrate (LOPRESSOR) 25 MG tablet   Oral   Take 1 tablet (25 mg total) by mouth 2 (two) times daily.   60 tablet   6   . nitroGLYCERIN (NITROSTAT) 0.4 MG SL tablet   Sublingual   Place 1 tablet (0.4 mg total) under the tongue every 5 (five) minutes x 3 doses as needed for chest pain.   25 tablet   3   . prasugrel (EFFIENT) 10 MG TABS   Oral   Take 1 tablet (10 mg total) by mouth daily.   30 tablet   6   . promethazine (PHENERGAN) 25 MG tablet   Oral   Take 1 tablet (25 mg total) by mouth every 6 (six) hours as needed for nausea.   12 tablet   0   . valsartan (DIOVAN) 320 MG tablet   Oral   Take 320  mg by mouth daily.           BP 133/78  Pulse 83  Temp(Src) 98.4 F (36.9 C) (Oral)  Resp 18  Ht 6\' 1"  (1.854 m)  Wt 250 lb (113.399 kg)  BMI 32.99 kg/m2   SpO2 98%  Physical Exam  Nursing note and vitals reviewed. Constitutional: He is oriented to person, place, and time. He appears well-developed and well-nourished. No distress.  HENT:  Head: Normocephalic and atraumatic.  Eyes: EOM are normal. Pupils are equal, round, and reactive to light.  Neck: Neck supple. No tracheal deviation present.  Cardiovascular: Normal rate.   Pulmonary/Chest: Effort normal. No respiratory distress.  Abdominal: Soft. He exhibits no distension.  Musculoskeletal: Normal range of motion. He exhibits no edema.  Symmetric pitting lower extremity edema.   Neurological: He is alert and oriented to person, place, and time. No sensory deficit.  Skin: Skin is warm and dry.  Psychiatric: He has a normal mood and affect. His behavior is normal.    ED Course  Procedures   DIAGNOSTIC STUDIES: Oxygen Saturation is 98% on room air, normal by my interpretation.    COORDINATION OF CARE:  7:38 PM Discussed course of care with pt at bedside which includes blood work and chest xray.  Pt understands and agrees.   Labs Reviewed  BASIC METABOLIC PANEL - Abnormal; Notable for the following:    Sodium 131 (*)    Glucose, Bld 155 (*)    BUN 27 (*)    All other components within normal limits  CBC - Abnormal; Notable for the following:    RBC 3.67 (*)    Hemoglobin 11.2 (*)    HCT 30.9 (*)    MCHC 36.2 (*)    All other components within normal limits  TROPONIN I   Dg Chest 2 View  10/21/2012  *RADIOLOGY REPORT*  Clinical Data: Chest pain.  CHEST - 2 VIEW  Comparison: 09/15/2012  Findings: Chronic scarring and atelectasis present at both lung bases.  No edema, infiltrate, pleural fluid or pulmonary nodules are detected.  The heart size is stable and at the upper limits of normal.  Mild degenerative changes are present in the thoracic spine.  IMPRESSION: Chronic scarring and atelectasis at both lung bases.   Original Report Authenticated By: Aletta Edouard, M.D.      1.  Chest pain    EKG:  Rhythm: Normal sinus rhythm Vent. rate 82 BPM PR interval 170 ms QRS duration 104 ms QT/QTc 382/446 ms Incomplete right bundle branch block Inferior Q waves ST segments: Nonspecific ST changes Comparison: Stable from previous EKG from 09/29/2012     MDM  57 year old male with chest pain. History of known coronary artery disease. Chest pain is very atypical for ACS given constant nature since last night. Troponin is normal. EKG is stable. Chest x-ray is clear. Low suspicion for emergent etiology. I feel the patient is safe for discharge at this time. Return precautions discussed.  I personally preformed the services scribed in my presence. The recorded information has been reviewed is accurate. Virgel Manifold, MD.         Virgel Manifold, MD 10/23/12 [phone]

## 2012-10-21 NOTE — ED Notes (Signed)
Chest pain since last night, had mi in January and was at [facility] for same.  Pt states pain started in his head and moved down into chest.

## 2012-10-21 NOTE — ED Notes (Signed)
Pt states he had an MI in January. Had first day of rehab yesterday working on his arms. Began having neck pain and an "aching like a strained muscle" on the left side of the chest. Denies SOB, nausea, dizziness. No distress noted at this time.

## 2012-10-21 NOTE — ED Notes (Signed)
Pt reports taking 324 mg of ASA prior to arrival.  EMS gave 1 sublingual nitro prior to arrival.

## 2012-10-22 ENCOUNTER — Encounter (HOSPITAL_COMMUNITY): Payer: PRIVATE HEALTH INSURANCE

## 2012-10-24 ENCOUNTER — Encounter (HOSPITAL_COMMUNITY)
Admission: RE | Admit: 2012-10-24 | Discharge: 2012-10-24 | Disposition: A | Discharge status: Home / Self Care | Payer: PRIVATE HEALTH INSURANCE | Source: Ambulatory Visit | Attending: Cardiology | Admitting: Cardiology

## 2012-10-27 ENCOUNTER — Encounter: Payer: Self-pay | Admitting: *Deleted

## 2012-10-27 ENCOUNTER — Other Ambulatory Visit: Payer: Self-pay | Admitting: *Deleted

## 2012-10-27 ENCOUNTER — Encounter (HOSPITAL_COMMUNITY): Payer: PRIVATE HEALTH INSURANCE

## 2012-10-27 DIAGNOSIS — N179 Acute kidney failure, unspecified: Secondary | ICD-10-CM

## 2012-10-27 DIAGNOSIS — I2510 Atherosclerotic heart disease of native coronary artery without angina pectoris: Secondary | ICD-10-CM

## 2012-10-27 DIAGNOSIS — I10 Essential (primary) hypertension: Secondary | ICD-10-CM

## 2012-10-29 ENCOUNTER — Encounter (HOSPITAL_COMMUNITY)
Admission: RE | Admit: 2012-10-29 | Discharge: 2012-10-29 | Disposition: A | Discharge status: Home / Self Care | Payer: PRIVATE HEALTH INSURANCE | Source: Ambulatory Visit | Attending: Cardiology | Admitting: Cardiology

## 2012-10-29 DIAGNOSIS — Z5189 Encounter for other specified aftercare: Secondary | ICD-10-CM | POA: Insufficient documentation

## 2012-10-29 DIAGNOSIS — Z9861 Coronary angioplasty status: Secondary | ICD-10-CM | POA: Insufficient documentation

## 2012-10-29 DIAGNOSIS — I252 Old myocardial infarction: Secondary | ICD-10-CM | POA: Insufficient documentation

## 2012-10-31 ENCOUNTER — Encounter (HOSPITAL_COMMUNITY): Payer: PRIVATE HEALTH INSURANCE

## 2012-11-03 ENCOUNTER — Encounter (HOSPITAL_COMMUNITY)
Admission: RE | Admit: 2012-11-03 | Discharge: 2012-11-03 | Disposition: A | Discharge status: Home / Self Care | Payer: PRIVATE HEALTH INSURANCE | Source: Ambulatory Visit | Attending: Cardiology | Admitting: Cardiology

## 2012-11-05 ENCOUNTER — Encounter (HOSPITAL_COMMUNITY)
Admission: RE | Admit: 2012-11-05 | Discharge: 2012-11-05 | Disposition: A | Discharge status: Home / Self Care | Payer: PRIVATE HEALTH INSURANCE | Source: Ambulatory Visit | Attending: Cardiology | Admitting: Cardiology

## 2012-11-07 ENCOUNTER — Encounter (HOSPITAL_COMMUNITY)
Admission: RE | Admit: 2012-11-07 | Discharge: 2012-11-07 | Disposition: A | Discharge status: Home / Self Care | Payer: PRIVATE HEALTH INSURANCE | Source: Ambulatory Visit | Attending: Cardiology | Admitting: Cardiology

## 2012-11-07 LAB — BASIC METABOLIC PANEL
BUN: 33 mg/dL — ABNORMAL HIGH (ref 6–23)
Chloride: 100 mEq/L (ref 96–112)
Creat: 0.93 mg/dL (ref 0.50–1.35)
Potassium: 4.6 mEq/L (ref 3.5–5.3)

## 2012-11-10 ENCOUNTER — Encounter (HOSPITAL_COMMUNITY): Payer: PRIVATE HEALTH INSURANCE

## 2012-11-10 ENCOUNTER — Encounter: Payer: Self-pay | Admitting: *Deleted

## 2012-11-12 ENCOUNTER — Encounter (HOSPITAL_COMMUNITY)
Admission: RE | Admit: 2012-11-12 | Discharge: 2012-11-12 | Disposition: A | Discharge status: Home / Self Care | Payer: PRIVATE HEALTH INSURANCE | Source: Ambulatory Visit | Attending: Cardiology | Admitting: Cardiology

## 2012-11-14 ENCOUNTER — Encounter (HOSPITAL_COMMUNITY)
Admission: RE | Admit: 2012-11-14 | Discharge: 2012-11-14 | Disposition: A | Discharge status: Home / Self Care | Payer: PRIVATE HEALTH INSURANCE | Source: Ambulatory Visit | Attending: Cardiology | Admitting: Cardiology

## 2012-11-17 ENCOUNTER — Encounter (HOSPITAL_COMMUNITY)
Admission: RE | Admit: 2012-11-17 | Discharge: 2012-11-17 | Disposition: A | Discharge status: Home / Self Care | Payer: PRIVATE HEALTH INSURANCE | Source: Ambulatory Visit | Attending: Cardiology | Admitting: Cardiology

## 2012-11-19 ENCOUNTER — Encounter (HOSPITAL_COMMUNITY): Payer: PRIVATE HEALTH INSURANCE

## 2012-11-21 ENCOUNTER — Encounter (HOSPITAL_COMMUNITY)
Admission: RE | Admit: 2012-11-21 | Discharge: 2012-11-21 | Disposition: A | Discharge status: Home / Self Care | Payer: PRIVATE HEALTH INSURANCE | Source: Ambulatory Visit | Attending: Cardiology | Admitting: Cardiology

## 2012-11-24 ENCOUNTER — Encounter (HOSPITAL_COMMUNITY)
Admission: RE | Admit: 2012-11-24 | Discharge: 2012-11-24 | Disposition: A | Discharge status: Home / Self Care | Payer: PRIVATE HEALTH INSURANCE | Source: Ambulatory Visit | Attending: Cardiology | Admitting: Cardiology

## 2012-11-26 ENCOUNTER — Encounter (HOSPITAL_COMMUNITY)
Admission: RE | Admit: 2012-11-26 | Discharge: 2012-11-26 | Disposition: A | Discharge status: Home / Self Care | Payer: PRIVATE HEALTH INSURANCE | Source: Ambulatory Visit | Attending: Cardiology | Admitting: Cardiology

## 2012-11-26 DIAGNOSIS — Z5189 Encounter for other specified aftercare: Secondary | ICD-10-CM | POA: Insufficient documentation

## 2012-11-26 DIAGNOSIS — I252 Old myocardial infarction: Secondary | ICD-10-CM | POA: Insufficient documentation

## 2012-11-26 DIAGNOSIS — Z9861 Coronary angioplasty status: Secondary | ICD-10-CM | POA: Insufficient documentation

## 2012-11-27 ENCOUNTER — Encounter (HOSPITAL_COMMUNITY): Payer: Self-pay | Admitting: *Deleted

## 2012-11-27 ENCOUNTER — Emergency Department (HOSPITAL_COMMUNITY): Payer: PRIVATE HEALTH INSURANCE

## 2012-11-27 ENCOUNTER — Emergency Department (HOSPITAL_COMMUNITY)
Admission: EM | Admit: 2012-11-27 | Discharge: 2012-11-27 | Disposition: A | Discharge status: Home / Self Care | Payer: PRIVATE HEALTH INSURANCE | Attending: Emergency Medicine | Admitting: Emergency Medicine

## 2012-11-27 DIAGNOSIS — Z8719 Personal history of other diseases of the digestive system: Secondary | ICD-10-CM | POA: Insufficient documentation

## 2012-11-27 DIAGNOSIS — R05 Cough: Secondary | ICD-10-CM | POA: Insufficient documentation

## 2012-11-27 DIAGNOSIS — J4489 Other specified chronic obstructive pulmonary disease: Secondary | ICD-10-CM | POA: Insufficient documentation

## 2012-11-27 DIAGNOSIS — Z87442 Personal history of urinary calculi: Secondary | ICD-10-CM | POA: Insufficient documentation

## 2012-11-27 DIAGNOSIS — Z7982 Long term (current) use of aspirin: Secondary | ICD-10-CM | POA: Insufficient documentation

## 2012-11-27 DIAGNOSIS — E119 Type 2 diabetes mellitus without complications: Secondary | ICD-10-CM | POA: Insufficient documentation

## 2012-11-27 DIAGNOSIS — I252 Old myocardial infarction: Secondary | ICD-10-CM | POA: Insufficient documentation

## 2012-11-27 DIAGNOSIS — R0609 Other forms of dyspnea: Secondary | ICD-10-CM | POA: Insufficient documentation

## 2012-11-27 DIAGNOSIS — Z79899 Other long term (current) drug therapy: Secondary | ICD-10-CM | POA: Insufficient documentation

## 2012-11-27 DIAGNOSIS — Z8619 Personal history of other infectious and parasitic diseases: Secondary | ICD-10-CM | POA: Insufficient documentation

## 2012-11-27 DIAGNOSIS — R0602 Shortness of breath: Secondary | ICD-10-CM | POA: Insufficient documentation

## 2012-11-27 DIAGNOSIS — I509 Heart failure, unspecified: Secondary | ICD-10-CM | POA: Insufficient documentation

## 2012-11-27 DIAGNOSIS — I10 Essential (primary) hypertension: Secondary | ICD-10-CM | POA: Insufficient documentation

## 2012-11-27 DIAGNOSIS — R0989 Other specified symptoms and signs involving the circulatory and respiratory systems: Secondary | ICD-10-CM | POA: Insufficient documentation

## 2012-11-27 DIAGNOSIS — Z7902 Long term (current) use of antithrombotics/antiplatelets: Secondary | ICD-10-CM | POA: Insufficient documentation

## 2012-11-27 DIAGNOSIS — R059 Cough, unspecified: Secondary | ICD-10-CM | POA: Insufficient documentation

## 2012-11-27 DIAGNOSIS — E785 Hyperlipidemia, unspecified: Secondary | ICD-10-CM | POA: Insufficient documentation

## 2012-11-27 DIAGNOSIS — Z8669 Personal history of other diseases of the nervous system and sense organs: Secondary | ICD-10-CM | POA: Insufficient documentation

## 2012-11-27 DIAGNOSIS — Z794 Long term (current) use of insulin: Secondary | ICD-10-CM | POA: Insufficient documentation

## 2012-11-27 DIAGNOSIS — J449 Chronic obstructive pulmonary disease, unspecified: Secondary | ICD-10-CM | POA: Insufficient documentation

## 2012-11-27 DIAGNOSIS — Z9861 Coronary angioplasty status: Secondary | ICD-10-CM | POA: Insufficient documentation

## 2012-11-27 DIAGNOSIS — F411 Generalized anxiety disorder: Secondary | ICD-10-CM | POA: Insufficient documentation

## 2012-11-27 DIAGNOSIS — M549 Dorsalgia, unspecified: Secondary | ICD-10-CM | POA: Insufficient documentation

## 2012-11-27 DIAGNOSIS — I2510 Atherosclerotic heart disease of native coronary artery without angina pectoris: Secondary | ICD-10-CM | POA: Insufficient documentation

## 2012-11-27 LAB — COMPREHENSIVE METABOLIC PANEL
ALT: 11 U/L (ref 0–53)
Alkaline Phosphatase: 79 U/L (ref 39–117)
BUN: 30 mg/dL — ABNORMAL HIGH (ref 6–23)
CO2: 25 mEq/L (ref 19–32)
Chloride: 97 mEq/L (ref 96–112)
GFR calc Af Amer: 86 mL/min — ABNORMAL LOW (ref >90)
GFR calc non Af Amer: 74 mL/min — ABNORMAL LOW (ref >90)
Glucose, Bld: 254 mg/dL — ABNORMAL HIGH (ref 70–99)
Potassium: 4.2 mEq/L (ref 3.5–5.1)
Sodium: 133 mEq/L — ABNORMAL LOW (ref 135–145)
Total Bilirubin: 0.5 mg/dL (ref 0.3–1.2)

## 2012-11-27 LAB — CBC WITH DIFFERENTIAL/PLATELET
Eosinophils Absolute: 0.1 10*3/uL (ref 0.0–0.7)
Hemoglobin: 11.6 g/dL — ABNORMAL LOW (ref 13.0–17.0)
Lymphocytes Relative: 30 % (ref 12–46)
Lymphs Abs: 1.5 10*3/uL (ref 0.7–4.0)
MCH: 30.3 pg (ref 26.0–34.0)
MCV: 84.6 fL (ref 78.0–100.0)
Monocytes Relative: 9 % (ref 3–12)
Neutrophils Relative %: 57 % (ref 43–77)
Platelets: 173 10*3/uL (ref 150–400)
RBC: 3.83 MIL/uL — ABNORMAL LOW (ref 4.22–5.81)
WBC: 5 10*3/uL (ref 4.0–10.5)

## 2012-11-27 LAB — PROTIME-INR: Prothrombin Time: 12.9 seconds (ref 11.6–15.2)

## 2012-11-27 NOTE — ED Provider Notes (Addendum)
History  This chart was scribed for Babette Relic, MD by Jenne Campus, ED Scribe. This patient was seen in room APA09/APA09 and the patient's care was started at 7:49 PM.  CSN: [id number]  Arrival date & time 11/27/12  1939   First MD Initiated Contact with Patient 11/27/12 1949      Chief Complaint  Patient presents with  . Chest Pain     The history is provided by the patient. No language interpreter was used.    Eugene [name] is a 57 y.o. male who presents to the Emergency Department complaining of 3 days of constant, moderate right upper back pain described as sharp, stabbing and non-radiating with associated 3 days of cough and exertional mild SOB. Pain is worse with cough and right arm movement and is nonexertional and nonpleuritic. He denies having any modifying factors. He reports that he has a h/o COPD and uses inhalers at home. Pt had a MI with stent placement in January 2014. He was then seen in the ED in February for atypical CP and was discharged home. He reports that the symptoms are similar to his prior MI in January. He states that he is on ASA and Effient currently. He denies CP, abdominal pain, nausea, emesis, diaphoresis, urinary symptoms and numbness or weakness as associated symptoms.     Past Medical History  Diagnosis Date  . Arteriosclerotic cardiovascular disease (ASCVD)     a. 05/2011 Cath/PCI: LM nl, LAD 70m, D1 small, D2 small 80m, LCX large 40p, RCA 50-60p, 99 hazy @ origin of PDA with 70-80 in PDA (2.5x26 Resolute Integrity & 3.0x15 Resolute Integrity DES).;  b. 08/2012 Inflat  STEMI/Cath/PCI: LM minor irregs, LAD 50p, D1 50, LCX nl, OM1 25, RCA 30-40p, 100d (treated with 2.75x32mm Promus Premier DES);  c. 08/2012 Echo: EF 55-60%, basal inferopost HK.  [name] Hyperlipidemia   . Diabetes mellitus     Peripheral neuropathy  . Bell palsy   . Hypertension   . COPD (chronic obstructive pulmonary disease)   . Sleep apnea   . Gallstones   . Cholelithiasis  07/2012    Asymptomatic; identified incidentally  . PONV (postoperative nausea and vomiting)   . Anxiety   . C. difficile colitis     a. 08/2012  . Myocardial infarct 09/08/12  . Nephrolithiasis   . Contrast dye induced nephropathy     a. 08/2012 post cath/pci  . CHF (congestive heart failure)     Past Surgical History  Procedure Laterality Date  . Circumcision    . Stents    . Esophagogastroduodenoscopy      in [place] over 20 yrs ago  . Colonoscopy      In [facility], approximately 2011 per patient, was normal. Advised to come back in 10 years.  . Esophagogastroduodenoscopy  06/12/2012    Procedure: ESOPHAGOGASTRODUODENOSCOPY (EGD);  Surgeon: Daneil Dolin, MD;  Location: [facility];  Service: Endoscopy;  Laterality: N/A;  9:45  . Cardiac catheterization      Family History  Problem Relation Age of Onset  . Diabetes Mother   . Heart attack Mother   . Stroke Mother   . Diabetes Sister   . Sleep apnea Sister   . Hypertension Brother   . Diabetes Brother   . Colon cancer Neg Hx   . Liver disease Neg Hx   . Diabetes Brother   . Hypertension Brother     History  Substance Use Topics  . Smoking status:  Never Smoker   . Smokeless tobacco: Not on file  . Alcohol Use: No     Comment: heavy etoh use 30 years ago      Review of Systems  10 Systems reviewed and all are negative for acute change except as noted in the HPI.  Allergies  Hydrocodone and Statins  Home Medications   Current Outpatient Rx  Name  Route  Sig  Dispense  Refill  . ALPRAZolam (XANAX) 0.5 MG tablet   Oral   Take 0.25-0.5 mg by mouth at bedtime as needed. Sleep aid         . aspirin EC 81 MG tablet   Oral   Take 81 mg by mouth at bedtime.          . budesonide-formoterol (SYMBICORT) 160-4.5 MCG/ACT inhaler   Inhalation   Inhale 2 puffs into the lungs 2 (two) times daily.         [name] doxazosin (CARDURA) 4 MG tablet   Oral   Take 1 tablet (4 mg total) by mouth at  bedtime.   30 tablet   11   . Flaxseed, Linseed, (FLAXSEED OIL) 1000 MG CAPS   Oral   Take 1,000 mg by mouth 2 (two) times daily.          . insulin aspart (NOVOLOG) 100 UNIT/ML injection   Subcutaneous   Inject 3-25 Units into the skin 2 (two) times daily. SLIDING SCALE  NOVOLOG INSULIN BEFORE BREAKFAST AND BEFORE DINNER:   FOR A BLOOD SUGAR OF 70-120 GIVE 0 UNITS.   FOR BLOOD SUGAR OF 121-150 GIVE 3 UNITS.   FOR BLOOD SUGAR OF 151-200 GIVE 4 UNITS.   FOR BLOOD SUGAR 201-250 GIVE 7 UNITS.   FOR BLOOD SUGAR 251-300 GIVE 11 UNITS.   FOR BLOOD SUGAR 301-350 GIVE 15 UNITS.   FOR BLOOD SUGAR 351-400 GIVE 20 UNITS.    FOR BLOOD SUGAR OVER 400 GIVE 25 UNITS AND CALL YOUR DOCTOR.         [name] insulin glargine (LANTUS) 100 UNIT/ML injection   Subcutaneous   Inject 80 Units into the skin at bedtime.          . metoprolol tartrate (LOPRESSOR) 25 MG tablet   Oral   Take 1 tablet (25 mg total) by mouth 2 (two) times daily.   60 tablet   6   . prasugrel (EFFIENT) 10 MG TABS   Oral   Take 1 tablet (10 mg total) by mouth daily.   30 tablet   6   . valsartan (DIOVAN) 320 MG tablet   Oral   Take 320 mg by mouth daily.         [name] albuterol (PROAIR HFA) 108 (90 BASE) MCG/ACT inhaler   Inhalation   Inhale 2 puffs into the lungs every 6 (six) hours as needed. For shortness of breath         . furosemide (LASIX) 40 MG tablet      Take 1.5 tabs in the am and 1 tab in the pm   60 tablet   6   . nitroGLYCERIN (NITROSTAT) 0.4 MG SL tablet   Sublingual   Place 1 tablet (0.4 mg total) under the tongue every 5 (five) minutes x 3 doses as needed for chest pain.   25 tablet   3   . potassium chloride SA (K-DUR,KLOR-CON) 20 MEQ tablet   Oral   Take 1 tablet (20 mEq total) by mouth daily.   30 tablet  6   . promethazine (PHENERGAN) 25 MG tablet   Oral   Take 1 tablet (25 mg total) by mouth every 6 (six) hours as needed for nausea.   12 tablet   0     Triage Vitals: BP 180/86   Pulse 83  Temp(Src) 97.9 F (36.6 C) (Oral)  Resp 18  Ht 6\' 1"  (1.854 m)  Wt 257 lb (116.574 kg)  BMI 33.91 kg/m2  SpO2 99%  Physical Exam  Nursing note and vitals reviewed. Constitutional:  Awake, alert, nontoxic appearance with baseline speech for patient.  HENT:  Head: Atraumatic.  Mouth/Throat: No oropharyngeal exudate.  Eyes: EOM are normal. Pupils are equal, round, and reactive to light. Right eye exhibits no discharge. Left eye exhibits no discharge.  Neck: Neck supple.  Cardiovascular: Normal rate and regular rhythm.   No murmur heard. Pulmonary/Chest: Effort normal and breath sounds normal. No stridor. No respiratory distress. He has no wheezes. He has no rales. He exhibits no tenderness.  Abdominal: Soft. Bowel sounds are normal. He exhibits no mass. There is no tenderness. There is no rebound.  Musculoskeletal: He exhibits edema (mild edema bilaterally that is baseline). He exhibits no tenderness.  Baseline ROM, moves extremities with no obvious new focal weakness.  Lymphadenopathy:    He has no cervical adenopathy.  Neurological:  Awake, alert, cooperative and aware of situation; motor strength bilaterally; sensation normal to light touch bilaterally; peripheral visual fields full to confrontation; no facial asymmetry; tongue midline; major cranial nerves appear intact; no pronator drift, normal finger to nose bilaterally, baseline gait without new ataxia.  Skin: No rash noted.  Psychiatric: He has a normal mood and affect.    ED Course  Procedures (including critical care time)  ECG: Sinus rhythm, ventricular rate 83, normal axis, incomplete right bundle branch block, inferior Q waves, no acute ischemic changes noted, no significant change noted compared with prior ECG  DIAGNOSTIC STUDIES: Oxygen Saturation is 99% on room air, normal by my interpretation.    COORDINATION OF CARE: 7:56 PM-Patient understands and agrees with initial ED impression and plan with  expectations set for ED visit.  Labs Reviewed  CBC WITH DIFFERENTIAL - Abnormal; Notable for the following:    RBC 3.83 (*)    Hemoglobin 11.6 (*)    HCT 32.4 (*)    All other components within normal limits  COMPREHENSIVE METABOLIC PANEL - Abnormal; Notable for the following:    Sodium 133 (*)    Glucose, Bld 254 (*)    BUN 30 (*)    Albumin 3.2 (*)    GFR calc non Af Amer 74 (*)    GFR calc Af Amer 86 (*)    All other components within normal limits  PRO B NATRIURETIC PEPTIDE - Abnormal; Notable for the following:    Pro B Natriuretic peptide (BNP) 357.9 (*)    All other components within normal limits  TROPONIN I  PROTIME-INR   Dg Chest 2 View  11/27/2012  *RADIOLOGY REPORT*  Clinical Data: Short of breath and chest pain  CHEST - 2 VIEW  Comparison: 10/21/2012  Findings: Cardiac enlargement without heart failure.  Small pleural effusions are present bilaterally.  Mild atelectasis in the lung bases, with improvement from the  prior study.  Negative for pneumonia.  IMPRESSION: Small pleural effusions bilaterally without heart failure.  Mild bibasilar atelectasis, improved from the prior study.   Original Report Authenticated By: Carl Best, M.D.      1. Dyspnea on  exertion       MDM  I doubt any other EMC precluding discharge at this time including, but not necessarily limited to the following:ACS, SBI.  Patient / Family / Caregiver informed of clinical course, understand medical decision-making process, and agree with plan.   Babette Relic, MD 12/08/12 [place], MD 12/08/12 [phone]

## 2012-11-27 NOTE — ED Notes (Signed)
Chest "tightness" with sob, onset app 3pm.   MI in January.2014,  No n/v,  No sweats.

## 2012-11-27 NOTE — ED Notes (Signed)
Pt concerned that his last stent placement "hasn't took" as before per pt., states cardiologist is Rothbart/[facility]

## 2012-11-28 ENCOUNTER — Encounter (HOSPITAL_COMMUNITY): Payer: PRIVATE HEALTH INSURANCE

## 2012-11-28 ENCOUNTER — Encounter: Payer: Self-pay | Admitting: Adult Health

## 2012-11-28 ENCOUNTER — Ambulatory Visit (INDEPENDENT_AMBULATORY_CARE_PROVIDER_SITE_OTHER): Payer: PRIVATE HEALTH INSURANCE | Admitting: Adult Health

## 2012-11-28 VITALS — BP 144/85 | HR 68 | Ht 73.0 in | Wt 252.8 lb

## 2012-11-28 DIAGNOSIS — I509 Heart failure, unspecified: Secondary | ICD-10-CM

## 2012-11-28 DIAGNOSIS — I2510 Atherosclerotic heart disease of native coronary artery without angina pectoris: Secondary | ICD-10-CM

## 2012-11-28 DIAGNOSIS — I5021 Acute systolic (congestive) heart failure: Secondary | ICD-10-CM

## 2012-11-28 MED ORDER — POTASSIUM CHLORIDE CRYS ER 20 MEQ PO TBCR: 20.0000 meq | EXTENDED_RELEASE_TABLET | Freq: Every day | ORAL | Status: DC

## 2012-11-28 MED ORDER — FUROSEMIDE 40 MG PO TABS: ORAL_TABLET | ORAL | Status: DC

## 2012-11-28 NOTE — Progress Notes (Signed)
HPI: Eugene [name] is a 57 y/o patient of Dr.Rothbart we are seeing for ongoing assessment and treatment of CAD with recent admission for STEMI of inferior/lateral wall in January of 2014, s/p PCI and DES placement to the RCA, using Promus Premier DES. He has a history of prior stent placement to the RCA. Symptoms for his MI were DOE, and orthopnea, but no chest pain. He had a lengthy hospitalization which included contrast induced nephropathy with Creatinine elevation to 3.07 and sluggish urine output, and also evidence of CHF. He was treated with diuretic. ARB was discontinued Echo demonstrated normal LV fx.   He was seen in ER on 11/27/2012 after complaining of shorntness of breath and coughing.  CXR demonstrated mild plerual effusions without evidence of CHF or pulmonary edema. He states that his symptoms are similar to prior MI without chest pain. His symptoms are atypical for angina, but the coughing and dyspnea are his anginal equivalent.  This has become concerning to him as he thinks his stents are beginning to close up causing these symptoms. Cough is non-productive.   Allergies  Allergen Reactions  . Hydrocodone Nausea And Vomiting  . Statins Other (See Comments)    Muscle aches    Current Outpatient Prescriptions  Medication Sig Dispense Refill  . albuterol (PROAIR HFA) 108 (90 BASE) MCG/ACT inhaler Inhale 2 puffs into the lungs every 6 (six) hours as needed. For shortness of breath      . ALPRAZolam (XANAX) 0.5 MG tablet Take 0.25-0.5 mg by mouth at bedtime as needed. Sleep aid      . aspirin EC 81 MG tablet Take 81 mg by mouth at bedtime.       . budesonide-formoterol (SYMBICORT) 160-4.5 MCG/ACT inhaler Inhale 2 puffs into the lungs 2 (two) times daily.      [name] doxazosin (CARDURA) 4 MG tablet Take 1 tablet (4 mg total) by mouth at bedtime.  30 tablet  11  . Flaxseed, Linseed, (FLAXSEED OIL) 1000 MG CAPS Take 1,000 mg by mouth 2 (two) times daily.       . furosemide (LASIX) 40 MG  tablet Take 40 mg by mouth 2 (two) times daily. Take 40 mg twice a day      . insulin aspart (NOVOLOG) 100 UNIT/ML injection Inject 3-25 Units into the skin 2 (two) times daily. SLIDING SCALE  NOVOLOG INSULIN BEFORE BREAKFAST AND BEFORE DINNER:   FOR A BLOOD SUGAR OF 70-120 GIVE 0 UNITS.   FOR BLOOD SUGAR OF 121-150 GIVE 3 UNITS.   FOR BLOOD SUGAR OF 151-200 GIVE 4 UNITS.   FOR BLOOD SUGAR 201-250 GIVE 7 UNITS.   FOR BLOOD SUGAR 251-300 GIVE 11 UNITS.   FOR BLOOD SUGAR 301-350 GIVE 15 UNITS.   FOR BLOOD SUGAR 351-400 GIVE 20 UNITS.    FOR BLOOD SUGAR OVER 400 GIVE 25 UNITS AND CALL YOUR DOCTOR.      [name] insulin glargine (LANTUS) 100 UNIT/ML injection Inject 80 Units into the skin at bedtime.       . metoprolol tartrate (LOPRESSOR) 25 MG tablet Take 1 tablet (25 mg total) by mouth 2 (two) times daily.  60 tablet  6  . nitroGLYCERIN (NITROSTAT) 0.4 MG SL tablet Place 1 tablet (0.4 mg total) under the tongue every 5 (five) minutes x 3 doses as needed for chest pain.  25 tablet  3  . prasugrel (EFFIENT) 10 MG TABS Take 1 tablet (10 mg total) by mouth daily.  30 tablet  6  .  promethazine (PHENERGAN) 25 MG tablet Take 1 tablet (25 mg total) by mouth every 6 (six) hours as needed for nausea.  12 tablet  0  . valsartan (DIOVAN) 320 MG tablet Take 320 mg by mouth daily.       No current facility-administered medications for this visit.    Past Medical History  Diagnosis Date  . Arteriosclerotic cardiovascular disease (ASCVD)     a. 05/2011 Cath/PCI: LM nl, LAD 69m, D1 small, D2 small 63m, LCX large 40p, RCA 50-60p, 99 hazy @ origin of PDA with 70-80 in PDA (2.5x26 Resolute Integrity & 3.0x15 Resolute Integrity DES).;  b. 08/2012 Inflat  STEMI/Cath/PCI: LM minor irregs, LAD 50p, D1 50, LCX nl, OM1 25, RCA 30-40p, 100d (treated with 2.75x100mm Promus Premier DES);  c. 08/2012 Echo: EF 55-60%, basal inferopost HK.  [name] Hyperlipidemia   . Diabetes mellitus     Peripheral neuropathy  . Bell palsy   . Hypertension     . COPD (chronic obstructive pulmonary disease)   . Sleep apnea   . Gallstones   . Cholelithiasis 07/2012    Asymptomatic; identified incidentally  . PONV (postoperative nausea and vomiting)   . Anxiety   . C. difficile colitis     a. 08/2012  . Myocardial infarct 09/08/12  . Nephrolithiasis   . Contrast dye induced nephropathy     a. 08/2012 post cath/pci  . CHF (congestive heart failure)     Past Surgical History  Procedure Laterality Date  . Circumcision    . Stents    . Esophagogastroduodenoscopy      in [place] over 20 yrs ago  . Colonoscopy      In [facility], approximately 2011 per patient, was normal. Advised to come back in 10 years.  . Esophagogastroduodenoscopy  06/12/2012    Procedure: ESOPHAGOGASTRODUODENOSCOPY (EGD);  Surgeon: Daneil Dolin, MD;  Location: [facility];  Service: Endoscopy;  Laterality: N/A;  9:45  . Cardiac catheterization      [id number] of systems complete and found to be negative unless listed above  PHYSICAL EXAM BP 144/85  Pulse 68  Ht 6\' 1"  (1.854 m)  Wt 252 lb 12 oz (114.647 kg)  BMI 33.35 kg/m2  SpO2 98%  General: Well developed, well nourished, in no acute distress Head: Eyes PERRLA, No xanthomas.   Normal cephalic and atramatic  Lungs: Expiratory wheezing, with frequent non-productive coughing with deep inspiration. [name] Heart: HRRR S1 S2, without MRG.  Pulses are 2+ & equal.            No carotid bruit. No JVD.  No abdominal bruits. No femoral bruits. Abdomen: Bowel sounds are positive, abdomen soft and non-tender without masses or                  Hernia's noted. Msk:  Back normal, normal gait. Normal strength and tone for age. Extremities: No clubbing, cyanosis 2+ edema bilaterally. .  DP +1 Neuro: Alert and oriented X 3. Psych:  Good affect, responds appropriately EKG: NSR rate of 72 bpm.  ASSESSMENT AND PLAN

## 2012-11-28 NOTE — Patient Instructions (Addendum)
Your physician recommends that you schedule a follow-up appointment in: Tuesday April 8 th at 2:00 PM   Your physician has recommended you make the following change in your medication:  1 - INCREASE Lasix to 60 mg in am and 40 mg in pm 2 - ADD Potassium 20 meq daily  Your physician recommends that you return for lab work in: Monday

## 2012-11-28 NOTE — Assessment & Plan Note (Signed)
There is evidence of mild fluid overload and mildly elevated BNP of 353 in the ER yesterday we will increase his Lasix to 60 mg in the morning and 40 mg in the evening. We have asked him to limit his fluid intake if he is drinking a lot of fluids even though he is taking IV diuretics. We will followup on Monday with a BMET and a BNP and see him in the office again on Tuesday. Lung discussion with the patient with Dr. Harrington Challenger concerning his symptoms. If he has recurrent shortness of breath or a nonweight of symptoms with increased Lasix he is present in the emergency room over the weekend.

## 2012-11-28 NOTE — Assessment & Plan Note (Signed)
This assessment, I do not feel that this is his anginal equivalent. He is coughing with bilateral wheezing and has some lower extremity edema which leads me to believe that he has some fluid overload causing some of his symptoms. I asked Dr. Wyatt Haste who is on site to review the case and see the patient with me. She is in agreement that she does not believe that this is his anginal equivalent. Not send for cath at this time.

## 2012-11-28 NOTE — Progress Notes (Deleted)
Name: Eugene [name]    DOB: 1956-05-21  Age: 57 y.o.  MR#: [id number]       PCP:  [name], MD      Insurance: Payor: [employer]COST  Plan: MEDCOST  Product Type: *No Product type*    CC:    Chief Complaint  Patient presents with  . Coronary Artery Disease    Recurrent chest pain   PT NOTING THAT HE IS GASPING FOR AIR, NOTED O2 LEVEL AS 98% ROOM AIR TODAY, NOTED TIGHTNESS IN CHEST LAST NIGHT THAT IS COMING AND GOING THUS REASON HE WENT TO ER VS Filed Vitals:   11/28/12 1143  BP: 144/85  Pulse: 68  Height: 6\' 1"  (1.854 m)  Weight: 252 lb 12 oz (114.647 kg)  SpO2: 98%    Weights Current Weight  11/28/12 252 lb 12 oz (114.647 kg)  11/27/12 257 lb (116.574 kg)  10/21/12 250 lb (113.399 kg)    Blood Pressure  BP Readings from Last 3 Encounters:  11/28/12 144/85  11/27/12 141/72  10/21/12 133/79     Admit date:  (Not on file) Last encounter with RMR:  09/29/2012   Allergy Hydrocodone and Statins  Current Outpatient Prescriptions  Medication Sig Dispense Refill  . albuterol (PROAIR HFA) 108 (90 BASE) MCG/ACT inhaler Inhale 2 puffs into the lungs every 6 (six) hours as needed. For shortness of breath      . ALPRAZolam (XANAX) 0.5 MG tablet Take 0.25-0.5 mg by mouth at bedtime as needed. Sleep aid      . aspirin EC 81 MG tablet Take 81 mg by mouth at bedtime.       . budesonide-formoterol (SYMBICORT) 160-4.5 MCG/ACT inhaler Inhale 2 puffs into the lungs 2 (two) times daily.      [name] doxazosin (CARDURA) 4 MG tablet Take 1 tablet (4 mg total) by mouth at bedtime.  30 tablet  11  . Flaxseed, Linseed, (FLAXSEED OIL) 1000 MG CAPS Take 1,000 mg by mouth 2 (two) times daily.       . furosemide (LASIX) 40 MG tablet Take 40 mg by mouth 2 (two) times daily. Take 40 mg twice a day      . insulin aspart (NOVOLOG) 100 UNIT/ML injection Inject 3-25 Units into the skin 2 (two) times daily. SLIDING SCALE  NOVOLOG INSULIN BEFORE BREAKFAST AND BEFORE DINNER:   FOR A BLOOD SUGAR OF 70-120 GIVE 0  UNITS.   FOR BLOOD SUGAR OF 121-150 GIVE 3 UNITS.   FOR BLOOD SUGAR OF 151-200 GIVE 4 UNITS.   FOR BLOOD SUGAR 201-250 GIVE 7 UNITS.   FOR BLOOD SUGAR 251-300 GIVE 11 UNITS.   FOR BLOOD SUGAR 301-350 GIVE 15 UNITS.   FOR BLOOD SUGAR 351-400 GIVE 20 UNITS.    FOR BLOOD SUGAR OVER 400 GIVE 25 UNITS AND CALL YOUR DOCTOR.      [name] insulin glargine (LANTUS) 100 UNIT/ML injection Inject 80 Units into the skin at bedtime.       . metoprolol tartrate (LOPRESSOR) 25 MG tablet Take 1 tablet (25 mg total) by mouth 2 (two) times daily.  60 tablet  6  . nitroGLYCERIN (NITROSTAT) 0.4 MG SL tablet Place 1 tablet (0.4 mg total) under the tongue every 5 (five) minutes x 3 doses as needed for chest pain.  25 tablet  3  . prasugrel (EFFIENT) 10 MG TABS Take 1 tablet (10 mg total) by mouth daily.  30 tablet  6  . promethazine (PHENERGAN) 25 MG tablet Take 1 tablet (25  mg total) by mouth every 6 (six) hours as needed for nausea.  12 tablet  0  . valsartan (DIOVAN) 320 MG tablet Take 320 mg by mouth daily.       No current facility-administered medications for this visit.    Discontinued Meds:   There are no discontinued medications.  Patient Active Problem List  Diagnosis  . DM type 2 (diabetes mellitus, type 2)  . Hypertension  . Cholelithiasis  . Arteriosclerotic cardiovascular disease (ASCVD)  . Hyperlipidemia  . Obstructive sleep apnea  . Hyponatremia  . Peripheral neuropathy  . Noncompliance  . Gastroesophageal reflux disease  . Anemia, normocytic normochromic  . Edema  . Cholelithiasis  . C. difficile colitis  . ST elevation myocardial infarction (STEMI) of inferolateral wall, initial episode of care  . ARF (acute renal failure)  . Acute systolic CHF (congestive heart failure), NYHA class 3  . CAD (coronary artery disease)  . Acute diastolic CHF (congestive heart failure)    LABS    Component Value Date/Time   NA 133* 11/27/2012 1955   NA 135 11/07/2012 1250   NA 131* 10/21/2012 1924   K 4.2  11/27/2012 1955   K 4.6 11/07/2012 1250   K 4.2 10/21/2012 1924   CL 97 11/27/2012 1955   CL 100 11/07/2012 1250   CL 97 10/21/2012 1924   CO2 25 11/27/2012 1955   CO2 29 11/07/2012 1250   CO2 26 10/21/2012 1924   GLUCOSE 254* 11/27/2012 1955   GLUCOSE 175* 11/07/2012 1250   GLUCOSE 155* 10/21/2012 1924   BUN 30* 11/27/2012 1955   BUN 33* 11/07/2012 1250   BUN 27* 10/21/2012 1924   CREATININE 1.10 11/27/2012 1955   CREATININE 0.93 11/07/2012 1250   CREATININE 0.89 10/21/2012 1924   CREATININE 1.15 09/29/2012 1553   CREATININE 0.94 09/16/2012 0615   CREATININE 1.05 08/18/2012 1513   CALCIUM 9.5 11/27/2012 1955   CALCIUM 9.0 11/07/2012 1250   CALCIUM 9.3 10/21/2012 1924   GFRNONAA 74* 11/27/2012 1955   GFRNONAA >90 10/21/2012 1924   GFRNONAA >90 09/16/2012 0615   GFRAA 86* 11/27/2012 1955   GFRAA >90 10/21/2012 1924   GFRAA >90 09/16/2012 0615   CMP     Component Value Date/Time   NA 133* 11/27/2012 1955   K 4.2 11/27/2012 1955   CL 97 11/27/2012 1955   CO2 25 11/27/2012 1955   GLUCOSE 254* 11/27/2012 1955   BUN 30* 11/27/2012 1955   CREATININE 1.10 11/27/2012 1955   CREATININE 0.93 11/07/2012 1250   CALCIUM 9.5 11/27/2012 1955   PROT 6.6 11/27/2012 1955   ALBUMIN 3.2* 11/27/2012 1955   AST 17 11/27/2012 1955   ALT 11 11/27/2012 1955   ALKPHOS 79 11/27/2012 1955   BILITOT 0.5 11/27/2012 1955   GFRNONAA 74* 11/27/2012 1955   GFRAA 86* 11/27/2012 1955       Component Value Date/Time   WBC 5.0 11/27/2012 1955   WBC 5.3 10/21/2012 1924   WBC 3.7* 09/15/2012 1330   HGB 11.6* 11/27/2012 1955   HGB 11.2* 10/21/2012 1924   HGB 10.1* 09/15/2012 1330   HCT 32.4* 11/27/2012 1955   HCT 30.9* 10/21/2012 1924   HCT 29.3* 09/15/2012 1330   MCV 84.6 11/27/2012 1955   MCV 84.2 10/21/2012 1924   MCV 85.2 09/15/2012 1330    Lipid Panel     Component Value Date/Time   CHOL 229* 09/09/2012 0319   TRIG 381* 09/09/2012 0319   HDL 36* 09/09/2012 0319  CHOLHDL 6.4 09/09/2012 0319   VLDL 76* 09/09/2012 0319   LDLCALC 117* 09/09/2012 0319    ABG     Component Value Date/Time   PHART 7.381 06/01/2011 0808   PCO2ART 44.3 06/01/2011 0808   PO2ART 56.0* 06/01/2011 0808   HCO3 26.3* 06/01/2011 0808   TCO2 28 06/01/2011 0808   ACIDBASEDEF 1.0 06/01/2011 0807   O2SAT 88.0 06/01/2011 0808     Lab Results  Component Value Date   TSH 1.753 08/18/2012   BNP (last 3 results)  Recent Labs  06/16/12 2155 11/27/12 1955  PROBNP 360.1* 357.9*   Cardiac Panel (last 3 results)  Recent Labs  11/27/12 1955  TROPONINI <0.30    Iron/TIBC/Ferritin    Component Value Date/Time   IRON 21* 09/13/2012 0920   TIBC 166* 09/13/2012 0920   FERRITIN 448* 09/13/2012 0920     EKG Orders placed in visit on 11/28/12  . EKG 12-LEAD     Prior Assessment and Plan Problem List as of 11/28/2012     ICD-9-CM   DM type 2 (diabetes mellitus, type 2)   Last Assessment & Plan   08/23/2011 Office Visit Written 08/23/2011  2:52 PM by Yehuda Savannah, MD     Patient is unaware of any recent hemoglobin A1c measurements.  We will seek recent laboratory results from his primary care physician.    Hypertension   Last Assessment & Plan   09/29/2012 Office Visit Written 09/29/2012  3:19 PM by Lendon Colonel, NP     Blood pressure re-checked in the office and is 130/78. Will continue current medications and review follow up labs. He will be seen again in 3 months unless symptomatic.    Cholelithiasis   Arteriosclerotic cardiovascular disease (ASCVD)   Last Assessment & Plan   09/29/2012 Office Visit Written 09/29/2012  2:57 PM by Lendon Colonel, NP     He is taking valsartan as directed now, and will need to have BMET drawn for follow up in the setting of contrast induced nephropathy. He is compliant with medications and offers no complaints of DOE, or orthopnea. He denies bleeding issues or weakness. I will refer him to cardiac rehab for continued education and to increase exercise. Will see him again in 3 months unless he becomes symptomatic.     Hyperlipidemia    Last Assessment & Plan   08/18/2012 Office Visit Written 08/18/2012  5:03 PM by Yehuda Savannah, MD     Options for treatment are limited, as the patient has suffered adverse reactions to multiple lipid-lowering medications.  It is unclear why he was advised that fenofibrate is contraindicated.  We have asked him to bring the letter that he received for review.    Obstructive sleep apnea   Last Assessment & Plan   08/23/2011 Office Visit Written 08/23/2011  2:58 PM by Yehuda Savannah, MD     Patient has improved with nocturnal CPAP therapy and supplemental oxygen.  Based upon ABG in 05/2011 with pH of 7.38, pCO2 of 44 and pO2 of 56, he also has an element of hypoventilation at rest and substantial hypoxemia.  The contribution of his weight is uncertain, since his BMI is only approximately 30.  Dr. Luan Pulling is assessing for additional causes of hypoxemia.  He might benefit from continuous oxygen therapy.     Hyponatremia   Last Assessment & Plan   10/24/2011 Office Visit Written 10/28/2011  8:37 AM by Yehuda Savannah, MD  Sodium of 128 when measured a few days ago.  This is partially due to a concomitant blood sugar of 368.  Continued monitoring will be necessary.     Peripheral neuropathy   Last Assessment & Plan   10/24/2011 Office Visit Written 10/24/2011  2:20 PM by Yehuda Savannah, MD     Risk of repetitive trauma or pressure in the lower extremities discussed with patient and suggestions provided as to how to avoid this as well as the need for him to monitor the condition of his skin on a daily basis.    Noncompliance   Gastroesophageal reflux disease   Anemia, normocytic normochromic   Last Assessment & Plan   08/18/2012 Office Visit Written 08/18/2012  5:02 PM by Yehuda Savannah, MD     No prior laboratory evaluation apparently performed.  Iron studies and stool for Hemoccult testing will be obtained.    Edema   Last Assessment & Plan   08/18/2012 Office Visit Written  08/18/2012  5:08 PM by Yehuda Savannah, MD     No etiology has been identified to account for his marked increase in peripheral edema.  He is on no particularly offensive drugs, but verapamil could be contributing and will be discontinued for now.  Further evaluation will include urinalysis, venous ultrasound of lower extremities and abdominal ultrasound.  He will monitor blood pressure at home.      Cholelithiasis   C. difficile colitis   ST elevation myocardial infarction (STEMI) of inferolateral wall, initial episode of care   ARF (acute renal failure)   Last Assessment & Plan   09/29/2012 Office Visit Written 09/29/2012  3:21 PM by Lendon Colonel, NP     Repeat BMET this week.    Acute systolic CHF (congestive heart failure), NYHA class 3   Last Assessment & Plan   09/29/2012 Office Visit Written 09/29/2012  3:21 PM by Lendon Colonel, NP     He continues to have chronic LEE, and is avoiding salt. Cath report demonstrates EF of 45%. Will recheck echo in 3 months with current medical management.     CAD (coronary artery disease)   Acute diastolic CHF (congestive heart failure)       Imaging: Dg Chest 2 View  11/27/2012  *RADIOLOGY REPORT*  Clinical Data: Short of breath and chest pain  CHEST - 2 VIEW  Comparison: 10/21/2012  Findings: Cardiac enlargement without heart failure.  Small pleural effusions are present bilaterally.  Mild atelectasis in the lung bases, with improvement from the  prior study.  Negative for pneumonia.  IMPRESSION: Small pleural effusions bilaterally without heart failure.  Mild bibasilar atelectasis, improved from the prior study.   Original Report Authenticated By: Carl Best, M.D.

## 2012-12-01 ENCOUNTER — Encounter (HOSPITAL_COMMUNITY)
Admission: RE | Admit: 2012-12-01 | Discharge: 2012-12-01 | Disposition: A | Discharge status: Home / Self Care | Payer: PRIVATE HEALTH INSURANCE | Source: Ambulatory Visit | Attending: Cardiology | Admitting: Cardiology

## 2012-12-01 ENCOUNTER — Other Ambulatory Visit: Payer: Self-pay | Admitting: *Deleted

## 2012-12-01 ENCOUNTER — Other Ambulatory Visit: Payer: Self-pay | Admitting: Adult Health

## 2012-12-01 DIAGNOSIS — R0602 Shortness of breath: Secondary | ICD-10-CM

## 2012-12-02 ENCOUNTER — Encounter: Payer: PRIVATE HEALTH INSURANCE | Admitting: Adult Health

## 2012-12-02 LAB — BASIC METABOLIC PANEL
Glucose, Bld: 218 mg/dL — ABNORMAL HIGH (ref 70–99)
Potassium: 4.7 mEq/L (ref 3.5–5.3)
Sodium: 136 mEq/L (ref 135–145)

## 2012-12-02 NOTE — Progress Notes (Signed)
HPI [name] is a 57 year old patient of Dr. Lattie Haw we are seeing for ongoing assessment and management of CAD with recent admission to [facility] in January of 2013 in the setting of STEMI. The patient was last seen in the office on 11/28/2012, was found to have fluid overload, complaints of recurrent chest pain. The patient was increased on his Lasix to 60 mg in a.m. and 40 mg in the p.m. He is asked to limit his fluid intake. Followup labs for a BMET and a BNP were ordered. Labs were found to be within normal limits, chest x-ray showed no evidence of CHF.   Allergies  Allergen Reactions  . Hydrocodone Nausea And Vomiting  . Statins Other (See Comments)    Muscle aches    Current Outpatient Prescriptions  Medication Sig Dispense Refill  . albuterol (PROAIR HFA) 108 (90 BASE) MCG/ACT inhaler Inhale 2 puffs into the lungs every 6 (six) hours as needed. For shortness of breath      . ALPRAZolam (XANAX) 0.5 MG tablet Take 0.25-0.5 mg by mouth at bedtime as needed. Sleep aid      . aspirin EC 81 MG tablet Take 81 mg by mouth at bedtime.       . budesonide-formoterol (SYMBICORT) 160-4.5 MCG/ACT inhaler Inhale 2 puffs into the lungs 2 (two) times daily.      [name] doxazosin (CARDURA) 4 MG tablet Take 1 tablet (4 mg total) by mouth at bedtime.  30 tablet  11  . Flaxseed, Linseed, (FLAXSEED OIL) 1000 MG CAPS Take 1,000 mg by mouth 2 (two) times daily.       . furosemide (LASIX) 40 MG tablet Take 1.5 tabs in the am and 1 tab in the pm  60 tablet  6  . insulin aspart (NOVOLOG) 100 UNIT/ML injection Inject 3-25 Units into the skin 2 (two) times daily. SLIDING SCALE  NOVOLOG INSULIN BEFORE BREAKFAST AND BEFORE DINNER:   FOR A BLOOD SUGAR OF 70-120 GIVE 0 UNITS.   FOR BLOOD SUGAR OF 121-150 GIVE 3 UNITS.   FOR BLOOD SUGAR OF 151-200 GIVE 4 UNITS.   FOR BLOOD SUGAR 201-250 GIVE 7 UNITS.   FOR BLOOD SUGAR 251-300 GIVE 11 UNITS.   FOR BLOOD SUGAR 301-350 GIVE 15 UNITS.   FOR BLOOD SUGAR 351-400 GIVE 20 UNITS.     FOR BLOOD SUGAR OVER 400 GIVE 25 UNITS AND CALL YOUR DOCTOR.      [name] insulin glargine (LANTUS) 100 UNIT/ML injection Inject 80 Units into the skin at bedtime.       . metoprolol tartrate (LOPRESSOR) 25 MG tablet Take 1 tablet (25 mg total) by mouth 2 (two) times daily.  60 tablet  6  . nitroGLYCERIN (NITROSTAT) 0.4 MG SL tablet Place 1 tablet (0.4 mg total) under the tongue every 5 (five) minutes x 3 doses as needed for chest pain.  25 tablet  3  . potassium chloride SA (K-DUR,KLOR-CON) 20 MEQ tablet Take 1 tablet (20 mEq total) by mouth daily.  30 tablet  6  . prasugrel (EFFIENT) 10 MG TABS Take 1 tablet (10 mg total) by mouth daily.  30 tablet  6  . promethazine (PHENERGAN) 25 MG tablet Take 1 tablet (25 mg total) by mouth every 6 (six) hours as needed for nausea.  12 tablet  0  . valsartan (DIOVAN) 320 MG tablet Take 320 mg by mouth daily.       No current facility-administered medications for this visit.    Past Medical  History  Diagnosis Date  . Arteriosclerotic cardiovascular disease (ASCVD)     a. 05/2011 Cath/PCI: LM nl, LAD 6m, D1 small, D2 small 59m, LCX large 40p, RCA 50-60p, 99 hazy @ origin of PDA with 70-80 in PDA (2.5x26 Resolute Integrity & 3.0x15 Resolute Integrity DES).;  b. 08/2012 Inflat  STEMI/Cath/PCI: LM minor irregs, LAD 50p, D1 50, LCX nl, OM1 25, RCA 30-40p, 100d (treated with 2.75x3mm Promus Premier DES);  c. 08/2012 Echo: EF 55-60%, basal inferopost HK.  [name] Hyperlipidemia   . Diabetes mellitus     Peripheral neuropathy  . Bell palsy   . Hypertension   . COPD (chronic obstructive pulmonary disease)   . Sleep apnea   . Gallstones   . Cholelithiasis 07/2012    Asymptomatic; identified incidentally  . PONV (postoperative nausea and vomiting)   . Anxiety   . C. difficile colitis     a. 08/2012  . Myocardial infarct 09/08/12  . Nephrolithiasis   . Contrast dye induced nephropathy     a. 08/2012 post cath/pci  . CHF (congestive heart failure)     Past Surgical  History  Procedure Laterality Date  . Circumcision    . Stents    . Esophagogastroduodenoscopy      in [place] over 20 yrs ago  . Colonoscopy      In [facility], approximately 2011 per patient, was normal. Advised to come back in 10 years.  . Esophagogastroduodenoscopy  06/12/2012    Procedure: ESOPHAGOGASTRODUODENOSCOPY (EGD);  Surgeon: Daneil Dolin, MD;  Location: [facility];  Service: Endoscopy;  Laterality: N/A;  9:45  . Cardiac catheterization      ROS: PHYSICAL EXAM There were no vitals taken for this visit.  EKG:  ASSESSMENT AND PLAN

## 2012-12-03 ENCOUNTER — Encounter (HOSPITAL_COMMUNITY)
Admission: RE | Admit: 2012-12-03 | Discharge: 2012-12-03 | Disposition: A | Discharge status: Home / Self Care | Payer: PRIVATE HEALTH INSURANCE | Source: Ambulatory Visit | Attending: Cardiology | Admitting: Cardiology

## 2012-12-05 ENCOUNTER — Encounter (HOSPITAL_COMMUNITY)
Admission: RE | Admit: 2012-12-05 | Discharge: 2012-12-05 | Disposition: A | Discharge status: Home / Self Care | Payer: PRIVATE HEALTH INSURANCE | Source: Ambulatory Visit | Attending: Cardiology | Admitting: Cardiology

## 2012-12-08 ENCOUNTER — Encounter (HOSPITAL_COMMUNITY)
Admission: RE | Admit: 2012-12-08 | Discharge: 2012-12-08 | Disposition: A | Discharge status: Home / Self Care | Payer: PRIVATE HEALTH INSURANCE | Source: Ambulatory Visit | Attending: Cardiology | Admitting: Cardiology

## 2012-12-10 ENCOUNTER — Encounter (HOSPITAL_COMMUNITY): Payer: PRIVATE HEALTH INSURANCE

## 2012-12-12 ENCOUNTER — Encounter (HOSPITAL_COMMUNITY)
Admission: RE | Admit: 2012-12-12 | Discharge: 2012-12-12 | Disposition: A | Discharge status: Home / Self Care | Payer: PRIVATE HEALTH INSURANCE | Source: Ambulatory Visit | Attending: Cardiology | Admitting: Cardiology

## 2012-12-15 ENCOUNTER — Encounter (HOSPITAL_COMMUNITY)
Admission: RE | Admit: 2012-12-15 | Discharge: 2012-12-15 | Disposition: A | Discharge status: Home / Self Care | Payer: PRIVATE HEALTH INSURANCE | Source: Ambulatory Visit | Attending: Cardiology | Admitting: Cardiology

## 2012-12-15 ENCOUNTER — Encounter (HOSPITAL_COMMUNITY): Payer: Self-pay | Admitting: *Deleted

## 2012-12-17 ENCOUNTER — Encounter (HOSPITAL_COMMUNITY)
Admission: RE | Admit: 2012-12-17 | Discharge: 2012-12-17 | Disposition: A | Discharge status: Home / Self Care | Payer: PRIVATE HEALTH INSURANCE | Source: Ambulatory Visit | Attending: Cardiology | Admitting: Cardiology

## 2012-12-18 ENCOUNTER — Encounter: Payer: Self-pay | Admitting: Adult Health

## 2012-12-18 ENCOUNTER — Ambulatory Visit (INDEPENDENT_AMBULATORY_CARE_PROVIDER_SITE_OTHER): Payer: PRIVATE HEALTH INSURANCE | Admitting: Adult Health

## 2012-12-18 VITALS — BP 158/89 | HR 72 | Ht 73.0 in | Wt 251.8 lb

## 2012-12-18 DIAGNOSIS — I509 Heart failure, unspecified: Secondary | ICD-10-CM

## 2012-12-18 DIAGNOSIS — I10 Essential (primary) hypertension: Secondary | ICD-10-CM

## 2012-12-18 DIAGNOSIS — I5031 Acute diastolic (congestive) heart failure: Secondary | ICD-10-CM

## 2012-12-18 DIAGNOSIS — I7090 Unspecified atherosclerosis: Secondary | ICD-10-CM

## 2012-12-18 DIAGNOSIS — I2510 Atherosclerotic heart disease of native coronary artery without angina pectoris: Secondary | ICD-10-CM

## 2012-12-18 NOTE — Progress Notes (Signed)
HPI [name] is a 58 year old patient of Dr. Lattie Haw nursing for ongoing assessment and management of CAD with recent admission to [facility] for ST elevation MI in January 2014 status is PCI and DES placement to the right coronary artery using a Promus Premier drug-eluting stent. The patient was also treated for CHF but echo demonstrated normal LV function. She was last in the office on 11/28/2012 with complaints of recurrent chest pain, and was concerned that his stents were beginning to close up. He appeared to have evidence of CHF and his Lasix was increased to 60 mg in the morning and 40 mg in the p.m. with potassium. Is not found to have what appeared to be unstable angina. He is here for followup for reassessment after treatment with medication adjustments. Followup labs were completed on 12/01/2012 revealing a sodium of 136 potassium 4.7 calcium 9.3 creatinine 1.1. Patient's weight during last office visit 252 pounds.    [name] has lost 8 pounds since being seen last is feeling much better without shortness of breath or coughing. The patient is feeling more energetic and he is without cardiac complaints. He continues with cardiac rehabilitation.  Allergies  Allergen Reactions  . Hydrocodone Nausea And Vomiting  . Statins Other (See Comments)    Muscle aches    Current Outpatient Prescriptions  Medication Sig Dispense Refill  . albuterol (PROAIR HFA) 108 (90 BASE) MCG/ACT inhaler Inhale 2 puffs into the lungs every 6 (six) hours as needed. For shortness of breath      . ALPRAZolam (XANAX) 0.5 MG tablet Take 0.25-0.5 mg by mouth at bedtime as needed. Sleep aid      . aspirin EC 81 MG tablet Take 81 mg by mouth at bedtime.       . budesonide-formoterol (SYMBICORT) 160-4.5 MCG/ACT inhaler Inhale 2 puffs into the lungs 2 (two) times daily.      [name] doxazosin (CARDURA) 4 MG tablet Take 1 tablet (4 mg total) by mouth at bedtime.  30 tablet  11  . Flaxseed, Linseed, (FLAXSEED OIL)  1000 MG CAPS Take 1,000 mg by mouth 2 (two) times daily.       . furosemide (LASIX) 40 MG tablet Take 1.5 tabs in the am and 1 tab in the pm  60 tablet  6  . insulin aspart (NOVOLOG) 100 UNIT/ML injection Inject 3-25 Units into the skin 2 (two) times daily. SLIDING SCALE  NOVOLOG INSULIN BEFORE BREAKFAST AND BEFORE DINNER:   FOR A BLOOD SUGAR OF 70-120 GIVE 0 UNITS.   FOR BLOOD SUGAR OF 121-150 GIVE 3 UNITS.   FOR BLOOD SUGAR OF 151-200 GIVE 4 UNITS.   FOR BLOOD SUGAR 201-250 GIVE 7 UNITS.   FOR BLOOD SUGAR 251-300 GIVE 11 UNITS.   FOR BLOOD SUGAR 301-350 GIVE 15 UNITS.   FOR BLOOD SUGAR 351-400 GIVE 20 UNITS.    FOR BLOOD SUGAR OVER 400 GIVE 25 UNITS AND CALL YOUR DOCTOR.      [name] insulin glargine (LANTUS) 100 UNIT/ML injection Inject 80 Units into the skin at bedtime.       . metoprolol tartrate (LOPRESSOR) 25 MG tablet Take 1 tablet (25 mg total) by mouth 2 (two) times daily.  60 tablet  6  . nitroGLYCERIN (NITROSTAT) 0.4 MG SL tablet Place 1 tablet (0.4 mg total) under the tongue every 5 (five) minutes x 3 doses as needed for chest pain.  25 tablet  3  . potassium chloride SA (K-DUR,KLOR-CON) 20 MEQ tablet Take  1 tablet (20 mEq total) by mouth daily.  30 tablet  6  . prasugrel (EFFIENT) 10 MG TABS Take 1 tablet (10 mg total) by mouth daily.  30 tablet  6  . promethazine (PHENERGAN) 25 MG tablet Take 1 tablet (25 mg total) by mouth every 6 (six) hours as needed for nausea.  12 tablet  0  . valsartan (DIOVAN) 320 MG tablet Take 320 mg by mouth daily.       No current facility-administered medications for this visit.    Past Medical History  Diagnosis Date  . Arteriosclerotic cardiovascular disease (ASCVD)     a. 05/2011 Cath/PCI: LM nl, LAD 37m, D1 small, D2 small 65m, LCX large 40p, RCA 50-60p, 99 hazy @ origin of PDA with 70-80 in PDA (2.5x26 Resolute Integrity & 3.0x15 Resolute Integrity DES).;  b. 08/2012 Inflat  STEMI/Cath/PCI: LM minor irregs, LAD 50p, D1 50, LCX nl, OM1 25, RCA 30-40p, 100d  (treated with 2.75x63mm Promus Premier DES);  c. 08/2012 Echo: EF 55-60%, basal inferopost HK.  [name] Hyperlipidemia   . Diabetes mellitus     Peripheral neuropathy  . Bell palsy   . Hypertension   . COPD (chronic obstructive pulmonary disease)   . Sleep apnea   . Gallstones   . Cholelithiasis 07/2012    Asymptomatic; identified incidentally  . PONV (postoperative nausea and vomiting)   . Anxiety   . C. difficile colitis     a. 08/2012  . Myocardial infarct 09/08/12  . Nephrolithiasis   . Contrast dye induced nephropathy     a. 08/2012 post cath/pci  . CHF (congestive heart failure)     Past Surgical History  Procedure Laterality Date  . Circumcision    . Stents    . Esophagogastroduodenoscopy      in [place] over 20 yrs ago  . Colonoscopy      In [facility], approximately 2011 per patient, was normal. Advised to come back in 10 years.  . Esophagogastroduodenoscopy  06/12/2012    Procedure: ESOPHAGOGASTRODUODENOSCOPY (EGD);  Surgeon: Daneil Dolin, MD;  Location: [facility];  Service: Endoscopy;  Laterality: N/A;  9:45  . Cardiac catheterization      [id number] of systems complete and found to be negative unless listed above  PHYSICAL EXAM BP 158/89  Pulse 72  Ht 6\' 1"  (1.854 m)  Wt 251 lb 12 oz (114.193 kg)  BMI 33.22 kg/m2  General: Well developed, well nourished, in no acute distress Head: Eyes PERRLA, No xanthomas.   Normal cephalic and atramatic  Lungs: Clear bilaterally to auscultation and percussion. Heart: HRRR S1 S2, without MRG.  Pulses are 2+ & equal.            No carotid bruit. No JVD.  No abdominal bruits. No femoral bruits. Abdomen: Bowel sounds are positive, abdomen soft and non-tender without masses or                  Hernia's noted. Msk:  Back normal, normal gait. Normal strength and tone for age. Extremities: No clubbing, cyanosis or edema.  DP +1 Neuro: Alert and oriented X 3. Psych:  Good affect, responds  appropriately  :  ASSESSMENT AND PLAN

## 2012-12-18 NOTE — Progress Notes (Deleted)
Name: Eugene [name]    DOB: 08/14/1956  Age: 57 y.o.  MR#: [id number]       PCP:  [name], MD      Insurance: Payor: [employer]  Plan: [employer]ST  Product Type: *No Product type*    CC:    Chief Complaint  Patient presents with  . Coronary Artery Disease    VS Filed Vitals:   12/18/12 1314  BP: 158/89  Pulse: 72  Height: 6\' 1"  (1.854 m)  Weight: 251 lb 12 oz (114.193 kg)    Weights Current Weight  12/18/12 251 lb 12 oz (114.193 kg)  11/28/12 252 lb 12 oz (114.647 kg)  11/27/12 257 lb (116.574 kg)    Blood Pressure  BP Readings from Last 3 Encounters:  12/18/12 158/89  11/28/12 144/85  11/27/12 141/72     Admit date:  (Not on file) Last encounter with RMR:  11/28/2012   Allergy Hydrocodone and Statins  Current Outpatient Prescriptions  Medication Sig Dispense Refill  . albuterol (PROAIR HFA) 108 (90 BASE) MCG/ACT inhaler Inhale 2 puffs into the lungs every 6 (six) hours as needed. For shortness of breath      . ALPRAZolam (XANAX) 0.5 MG tablet Take 0.25-0.5 mg by mouth at bedtime as needed. Sleep aid      . aspirin EC 81 MG tablet Take 81 mg by mouth at bedtime.       . budesonide-formoterol (SYMBICORT) 160-4.5 MCG/ACT inhaler Inhale 2 puffs into the lungs 2 (two) times daily.      [name] doxazosin (CARDURA) 4 MG tablet Take 1 tablet (4 mg total) by mouth at bedtime.  30 tablet  11  . Flaxseed, Linseed, (FLAXSEED OIL) 1000 MG CAPS Take 1,000 mg by mouth 2 (two) times daily.       . furosemide (LASIX) 40 MG tablet Take 1.5 tabs in the am and 1 tab in the pm  60 tablet  6  . insulin aspart (NOVOLOG) 100 UNIT/ML injection Inject 3-25 Units into the skin 2 (two) times daily. SLIDING SCALE  NOVOLOG INSULIN BEFORE BREAKFAST AND BEFORE DINNER:   FOR A BLOOD SUGAR OF 70-120 GIVE 0 UNITS.   FOR BLOOD SUGAR OF 121-150 GIVE 3 UNITS.   FOR BLOOD SUGAR OF 151-200 GIVE 4 UNITS.   FOR BLOOD SUGAR 201-250 GIVE 7 UNITS.   FOR BLOOD SUGAR 251-300 GIVE 11 UNITS.   FOR BLOOD SUGAR 301-350 GIVE  15 UNITS.   FOR BLOOD SUGAR 351-400 GIVE 20 UNITS.    FOR BLOOD SUGAR OVER 400 GIVE 25 UNITS AND CALL YOUR DOCTOR.      [name] insulin glargine (LANTUS) 100 UNIT/ML injection Inject 80 Units into the skin at bedtime.       . metoprolol tartrate (LOPRESSOR) 25 MG tablet Take 1 tablet (25 mg total) by mouth 2 (two) times daily.  60 tablet  6  . nitroGLYCERIN (NITROSTAT) 0.4 MG SL tablet Place 1 tablet (0.4 mg total) under the tongue every 5 (five) minutes x 3 doses as needed for chest pain.  25 tablet  3  . potassium chloride SA (K-DUR,KLOR-CON) 20 MEQ tablet Take 1 tablet (20 mEq total) by mouth daily.  30 tablet  6  . prasugrel (EFFIENT) 10 MG TABS Take 1 tablet (10 mg total) by mouth daily.  30 tablet  6  . promethazine (PHENERGAN) 25 MG tablet Take 1 tablet (25 mg total) by mouth every 6 (six) hours as needed for nausea.  12 tablet  0  .  valsartan (DIOVAN) 320 MG tablet Take 320 mg by mouth daily.       No current facility-administered medications for this visit.    Discontinued Meds:   There are no discontinued medications.  Patient Active Problem List  Diagnosis  . DM type 2 (diabetes mellitus, type 2)  . Hypertension  . Cholelithiasis  . Arteriosclerotic cardiovascular disease (ASCVD)  . Hyperlipidemia  . Obstructive sleep apnea  . Hyponatremia  . Peripheral neuropathy  . Noncompliance  . Gastroesophageal reflux disease  . Anemia, normocytic normochromic  . Edema  . Cholelithiasis  . C. difficile colitis  . ST elevation myocardial infarction (STEMI) of inferolateral wall, initial episode of care  . ARF (acute renal failure)  . Acute systolic CHF (congestive heart failure), NYHA class 3  . CAD (coronary artery disease)  . Acute diastolic CHF (congestive heart failure)    LABS    Component Value Date/Time   NA 136 12/01/2012 1320   NA 133* 11/27/2012 1955   NA 135 11/07/2012 1250   K 4.7 12/01/2012 1320   K 4.2 11/27/2012 1955   K 4.6 11/07/2012 1250   CL 101 12/01/2012 1320   CL 97  11/27/2012 1955   CL 100 11/07/2012 1250   CO2 28 12/01/2012 1320   CO2 25 11/27/2012 1955   CO2 29 11/07/2012 1250   GLUCOSE 218* 12/01/2012 1320   GLUCOSE 254* 11/27/2012 1955   GLUCOSE 175* 11/07/2012 1250   BUN 37* 12/01/2012 1320   BUN 30* 11/27/2012 1955   BUN 33* 11/07/2012 1250   CREATININE 1.11 12/01/2012 1320   CREATININE 1.10 11/27/2012 1955   CREATININE 0.93 11/07/2012 1250   CREATININE 0.89 10/21/2012 1924   CREATININE 1.15 09/29/2012 1553   CREATININE 0.94 09/16/2012 0615   CALCIUM 9.3 12/01/2012 1320   CALCIUM 9.5 11/27/2012 1955   CALCIUM 9.0 11/07/2012 1250   GFRNONAA 74* 11/27/2012 1955   GFRNONAA >90 10/21/2012 1924   GFRNONAA >90 09/16/2012 0615   GFRAA 86* 11/27/2012 1955   GFRAA >90 10/21/2012 1924   GFRAA >90 09/16/2012 0615   CMP     Component Value Date/Time   NA 136 12/01/2012 1320   K 4.7 12/01/2012 1320   CL 101 12/01/2012 1320   CO2 28 12/01/2012 1320   GLUCOSE 218* 12/01/2012 1320   BUN 37* 12/01/2012 1320   CREATININE 1.11 12/01/2012 1320   CREATININE 1.10 11/27/2012 1955   CALCIUM 9.3 12/01/2012 1320   PROT 6.6 11/27/2012 1955   ALBUMIN 3.2* 11/27/2012 1955   AST 17 11/27/2012 1955   ALT 11 11/27/2012 1955   ALKPHOS 79 11/27/2012 1955   BILITOT 0.5 11/27/2012 1955   GFRNONAA 74* 11/27/2012 1955   GFRAA 86* 11/27/2012 1955       Component Value Date/Time   WBC 5.0 11/27/2012 1955   WBC 5.3 10/21/2012 1924   WBC 3.7* 09/15/2012 1330   HGB 11.6* 11/27/2012 1955   HGB 11.2* 10/21/2012 1924   HGB 10.1* 09/15/2012 1330   HCT 32.4* 11/27/2012 1955   HCT 30.9* 10/21/2012 1924   HCT 29.3* 09/15/2012 1330   MCV 84.6 11/27/2012 1955   MCV 84.2 10/21/2012 1924   MCV 85.2 09/15/2012 1330    Lipid Panel     Component Value Date/Time   CHOL 229* 09/09/2012 0319   TRIG 381* 09/09/2012 0319   HDL 36* 09/09/2012 0319   CHOLHDL 6.4 09/09/2012 0319   VLDL 76* 09/09/2012 0319   LDLCALC 117* 09/09/2012 0319  ABG    Component Value Date/Time   PHART 7.381 06/01/2011 0808   PCO2ART 44.3 06/01/2011 0808   PO2ART 56.0*  06/01/2011 0808   HCO3 26.3* 06/01/2011 0808   TCO2 28 06/01/2011 0808   ACIDBASEDEF 1.0 06/01/2011 0807   O2SAT 88.0 06/01/2011 0808     Lab Results  Component Value Date   TSH 1.753 08/18/2012   BNP (last 3 results)  Recent Labs  06/16/12 2155 11/27/12 1955  PROBNP 360.1* 357.9*   Cardiac Panel (last 3 results) No results found for this basename: CKTOTAL, CKMB, TROPONINI, RELINDX,  in the last 72 hours  Iron/TIBC/Ferritin    Component Value Date/Time   IRON 21* 09/13/2012 0920   TIBC 166* 09/13/2012 0920   FERRITIN 448* 09/13/2012 0920     EKG Orders placed in visit on 11/28/12  . EKG 12-LEAD     Prior Assessment and Plan Problem List as of 12/18/2012     ICD-9-CM   DM type 2 (diabetes mellitus, type 2)   Last Assessment & Plan   08/23/2011 Office Visit Written 08/23/2011  2:52 PM by Yehuda Savannah, MD     Patient is unaware of any recent hemoglobin A1c measurements.  We will seek recent laboratory results from his primary care physician.    Hypertension   Last Assessment & Plan   09/29/2012 Office Visit Written 09/29/2012  3:19 PM by Lendon Colonel, NP     Blood pressure re-checked in the office and is 130/78. Will continue current medications and review follow up labs. He will be seen again in 3 months unless symptomatic.    Cholelithiasis   Arteriosclerotic cardiovascular disease (ASCVD)   Last Assessment & Plan   09/29/2012 Office Visit Written 09/29/2012  2:57 PM by Lendon Colonel, NP     He is taking valsartan as directed now, and will need to have BMET drawn for follow up in the setting of contrast induced nephropathy. He is compliant with medications and offers no complaints of DOE, or orthopnea. He denies bleeding issues or weakness. I will refer him to cardiac rehab for continued education and to increase exercise. Will see him again in 3 months unless he becomes symptomatic.     Hyperlipidemia   Last Assessment & Plan   08/18/2012 Office Visit Written  08/18/2012  5:03 PM by Yehuda Savannah, MD     Options for treatment are limited, as the patient has suffered adverse reactions to multiple lipid-lowering medications.  It is unclear why he was advised that fenofibrate is contraindicated.  We have asked him to bring the letter that he received for review.    Obstructive sleep apnea   Last Assessment & Plan   08/23/2011 Office Visit Written 08/23/2011  2:58 PM by Yehuda Savannah, MD     Patient has improved with nocturnal CPAP therapy and supplemental oxygen.  Based upon ABG in 05/2011 with pH of 7.38, pCO2 of 44 and pO2 of 56, he also has an element of hypoventilation at rest and substantial hypoxemia.  The contribution of his weight is uncertain, since his BMI is only approximately 30.  Dr. Luan Pulling is assessing for additional causes of hypoxemia.  He might benefit from continuous oxygen therapy.     Hyponatremia   Last Assessment & Plan   10/24/2011 Office Visit Written 10/28/2011  8:37 AM by Yehuda Savannah, MD     Sodium of 128 when measured a few days ago.  This is partially due  to a concomitant blood sugar of 368.  Continued monitoring will be necessary.     Peripheral neuropathy   Last Assessment & Plan   10/24/2011 Office Visit Written 10/24/2011  2:20 PM by Yehuda Savannah, MD     Risk of repetitive trauma or pressure in the lower extremities discussed with patient and suggestions provided as to how to avoid this as well as the need for him to monitor the condition of his skin on a daily basis.    Noncompliance   Gastroesophageal reflux disease   Anemia, normocytic normochromic   Last Assessment & Plan   08/18/2012 Office Visit Written 08/18/2012  5:02 PM by Yehuda Savannah, MD     No prior laboratory evaluation apparently performed.  Iron studies and stool for Hemoccult testing will be obtained.    Edema   Last Assessment & Plan   08/18/2012 Office Visit Written 08/18/2012  5:08 PM by Yehuda Savannah, MD     No etiology  has been identified to account for his marked increase in peripheral edema.  He is on no particularly offensive drugs, but verapamil could be contributing and will be discontinued for now.  Further evaluation will include urinalysis, venous ultrasound of lower extremities and abdominal ultrasound.  He will monitor blood pressure at home.      Cholelithiasis   C. difficile colitis   ST elevation myocardial infarction (STEMI) of inferolateral wall, initial episode of care   ARF (acute renal failure)   Last Assessment & Plan   09/29/2012 Office Visit Written 09/29/2012  3:21 PM by Lendon Colonel, NP     Repeat BMET this week.    Acute systolic CHF (congestive heart failure), NYHA class 3   Last Assessment & Plan   11/28/2012 Office Visit Written 11/28/2012 12:31 PM by Lendon Colonel, NP     There is evidence of mild fluid overload and mildly elevated BNP of 353 in the ER yesterday we will increase his Lasix to 60 mg in the morning and 40 mg in the evening. We have asked him to limit his fluid intake if he is drinking a lot of fluids even though he is taking IV diuretics. We will followup on Monday with a BMET and a BNP and see him in the office again on Tuesday. Lung discussion with the patient with Dr. Harrington Challenger concerning his symptoms. If he has recurrent shortness of breath or a nonweight of symptoms with increased Lasix he is present in the emergency room over the weekend.    CAD (coronary artery disease)   Last Assessment & Plan   11/28/2012 Office Visit Written 11/28/2012 12:30 PM by Lendon Colonel, NP     This assessment, I do not feel that this is his anginal equivalent. He is coughing with bilateral wheezing and has some lower extremity edema which leads me to believe that he has some fluid overload causing some of his symptoms. I asked Dr. Wyatt Haste who is on site to review the case and see the patient with me. She is in agreement that she does not believe that this is his anginal equivalent. Not  send for cath at this time.    Acute diastolic CHF (congestive heart failure)       Imaging: Dg Chest 2 View  11/27/2012  *RADIOLOGY REPORT*  Clinical Data: Short of breath and chest pain  CHEST - 2 VIEW  Comparison: 10/21/2012  Findings: Cardiac enlargement without heart failure.  Small pleural  effusions are present bilaterally.  Mild atelectasis in the lung bases, with improvement from the  prior study.  Negative for pneumonia.  IMPRESSION: Small pleural effusions bilaterally without heart failure.  Mild bibasilar atelectasis, improved from the prior study.   Original Report Authenticated By: Carl Best, M.D.

## 2012-12-18 NOTE — Patient Instructions (Addendum)
Your physician recommends that you schedule a follow-up appointment in: [place] has recommended you make the following change in your medication:   1) DECREASE LASIX TO 40MG  TWICE DAILY

## 2012-12-18 NOTE — Assessment & Plan Note (Signed)
Blood pressure is mildly elevated on this visit, but he was in a hurry to get here. He normally runs a blood pressure typically between 120 and 135. In cardiac rehabilitation he was found to have blood pressures as low as 110. We will continue him on current medication regimen. If blood pressure remains elevated or trending upward at cardiac rehabilitation we may need to adjust medications. At this time watch and wait.

## 2012-12-18 NOTE — Assessment & Plan Note (Signed)
Well compensated at this time. Weight is down 8 pounds and he is feeling much better. He has been reminded to adhere to a low-sodium diet.

## 2012-12-18 NOTE — Assessment & Plan Note (Signed)
Symptoms of coughing and dyspnea have resolved with increased dose of Lasix to 40 mg one half tablets in the morning and 40 mg at nighttime. He has lost 8 pounds. We will decrease his Lasix to 40 mg twice a day now as his creatinine rose to 1.10 from normal of 0.86. I reviewed his labs with him and he verbalizes understanding. We will see him again in 6 months unless he becomes symptomatic.

## 2012-12-19 ENCOUNTER — Encounter (HOSPITAL_COMMUNITY): Payer: PRIVATE HEALTH INSURANCE

## 2012-12-22 ENCOUNTER — Encounter (HOSPITAL_COMMUNITY)
Admission: RE | Admit: 2012-12-22 | Discharge: 2012-12-22 | Disposition: A | Discharge status: Home / Self Care | Payer: PRIVATE HEALTH INSURANCE | Source: Ambulatory Visit | Attending: Cardiology | Admitting: Cardiology

## 2012-12-24 ENCOUNTER — Encounter (HOSPITAL_COMMUNITY)
Admission: RE | Admit: 2012-12-24 | Discharge: 2012-12-24 | Disposition: A | Discharge status: Home / Self Care | Payer: PRIVATE HEALTH INSURANCE | Source: Ambulatory Visit | Attending: Cardiology | Admitting: Cardiology

## 2012-12-26 ENCOUNTER — Encounter: Payer: Self-pay | Admitting: Family Medicine

## 2012-12-26 ENCOUNTER — Encounter (HOSPITAL_COMMUNITY)
Admission: RE | Admit: 2012-12-26 | Discharge: 2012-12-26 | Disposition: A | Discharge status: Home / Self Care | Payer: PRIVATE HEALTH INSURANCE | Source: Ambulatory Visit | Attending: Cardiology | Admitting: Cardiology

## 2012-12-26 DIAGNOSIS — Z9861 Coronary angioplasty status: Secondary | ICD-10-CM | POA: Insufficient documentation

## 2012-12-26 DIAGNOSIS — Z5189 Encounter for other specified aftercare: Secondary | ICD-10-CM | POA: Insufficient documentation

## 2012-12-26 DIAGNOSIS — I252 Old myocardial infarction: Secondary | ICD-10-CM | POA: Insufficient documentation

## 2012-12-29 ENCOUNTER — Encounter (HOSPITAL_COMMUNITY)
Admission: RE | Admit: 2012-12-29 | Discharge: 2012-12-29 | Disposition: A | Discharge status: Home / Self Care | Payer: PRIVATE HEALTH INSURANCE | Source: Ambulatory Visit | Attending: Cardiology | Admitting: Cardiology

## 2012-12-31 ENCOUNTER — Encounter (HOSPITAL_COMMUNITY): Payer: PRIVATE HEALTH INSURANCE

## 2013-01-02 ENCOUNTER — Encounter: Payer: Self-pay | Admitting: *Deleted

## 2013-01-02 ENCOUNTER — Encounter (HOSPITAL_COMMUNITY): Payer: PRIVATE HEALTH INSURANCE

## 2013-01-05 ENCOUNTER — Encounter (HOSPITAL_COMMUNITY): Payer: PRIVATE HEALTH INSURANCE

## 2013-01-07 ENCOUNTER — Encounter (HOSPITAL_COMMUNITY): Payer: PRIVATE HEALTH INSURANCE

## 2013-01-09 ENCOUNTER — Encounter (HOSPITAL_COMMUNITY): Payer: PRIVATE HEALTH INSURANCE

## 2013-01-12 ENCOUNTER — Encounter (HOSPITAL_COMMUNITY): Payer: PRIVATE HEALTH INSURANCE

## 2013-01-12 ENCOUNTER — Ambulatory Visit: Payer: Self-pay | Admitting: Family Medicine

## 2013-01-13 ENCOUNTER — Ambulatory Visit: Payer: Self-pay | Admitting: Family Medicine

## 2013-01-14 ENCOUNTER — Ambulatory Visit: Payer: Self-pay | Admitting: Family Medicine

## 2013-01-14 ENCOUNTER — Encounter (HOSPITAL_COMMUNITY): Payer: PRIVATE HEALTH INSURANCE

## 2013-01-15 ENCOUNTER — Encounter: Payer: Self-pay | Admitting: Family Medicine

## 2013-01-16 ENCOUNTER — Encounter (HOSPITAL_COMMUNITY): Payer: PRIVATE HEALTH INSURANCE

## 2013-01-19 ENCOUNTER — Encounter (HOSPITAL_COMMUNITY): Payer: PRIVATE HEALTH INSURANCE

## 2013-01-20 ENCOUNTER — Encounter: Payer: Self-pay | Admitting: Family Medicine

## 2013-01-20 ENCOUNTER — Ambulatory Visit (INDEPENDENT_AMBULATORY_CARE_PROVIDER_SITE_OTHER): Payer: PRIVATE HEALTH INSURANCE | Admitting: Family Medicine

## 2013-01-20 VITALS — BP 110/72 | HR 70 | Ht 73.0 in | Wt 260.1 lb

## 2013-01-20 DIAGNOSIS — I5031 Acute diastolic (congestive) heart failure: Secondary | ICD-10-CM

## 2013-01-20 DIAGNOSIS — I509 Heart failure, unspecified: Secondary | ICD-10-CM

## 2013-01-20 DIAGNOSIS — E119 Type 2 diabetes mellitus without complications: Secondary | ICD-10-CM

## 2013-01-20 LAB — POCT GLYCOSYLATED HEMOGLOBIN (HGB A1C): Hemoglobin A1C: 8.2

## 2013-01-20 MED ORDER — VALSARTAN 160 MG PO TABS: 160.0000 mg | ORAL_TABLET | Freq: Every day | ORAL | Status: DC

## 2013-01-20 NOTE — Patient Instructions (Addendum)
Novolog   Use 4 units at breakfast and 6 units at supper  Continue long acting insulin  Reduce Diovan to 160 mg daily

## 2013-01-20 NOTE — Progress Notes (Signed)
  Subjective:    Patient ID: Eugene [name], male    DOB: October 17, 1955, 57 y.o.   MRN: [id number]  Diabetes He presents for his follow-up diabetic visit. He has type 2 diabetes mellitus. His disease course has been improving. There are no hypoglycemic associated symptoms. There are no diabetic associated symptoms. There are no hypoglycemic complications. There are no diabetic complications. Current diabetic treatment includes insulin injections and oral agent (monotherapy). He is compliant with treatment all of the time. His weight is stable. When asked about meal planning, he reported none.   Patient relates multiple different times when he gets up he feels dizzy feels like he might pass out this happens at least 3 times a week he states this been going on over the past several weeks. He denies any excessive chest pressure tightness pain shortness breath swelling in the legs or any other unusual problem. Family history noncontributory. Past medical history diabetes heart disease.   Review of Systems    See above. Objective:   Physical Exam Lungs are clear no crackles heart is regular pulse normal blood pressure good abdomen soft extremities no edema blood pressure was checked laying sitting standing and he does have a significant drop in blood pressure.       Assessment & Plan:  DM- better , add novolog 4u at breakfaast, 8 u at supper, recheck 3 month, I reinforced exercise and diet.Still uncontrolled. A1c 8.2 orthohypotension- reduce Diovan to 160 call if ongoing, I believe though blood pressure medicines too strong for him causing a significant low blood pressure. 25 minutes was spent with this patient working through his issue of orthostatic hypotension as well as his uncontrolled diabetes.

## 2013-01-21 ENCOUNTER — Encounter (HOSPITAL_COMMUNITY): Payer: PRIVATE HEALTH INSURANCE

## 2013-01-23 ENCOUNTER — Encounter (HOSPITAL_COMMUNITY): Payer: PRIVATE HEALTH INSURANCE

## 2013-01-26 ENCOUNTER — Encounter (HOSPITAL_COMMUNITY): Payer: PRIVATE HEALTH INSURANCE

## 2013-01-28 ENCOUNTER — Encounter: Payer: Self-pay | Admitting: Family Medicine

## 2013-01-28 ENCOUNTER — Ambulatory Visit (INDEPENDENT_AMBULATORY_CARE_PROVIDER_SITE_OTHER): Payer: PRIVATE HEALTH INSURANCE | Admitting: Family Medicine

## 2013-01-28 ENCOUNTER — Encounter (HOSPITAL_COMMUNITY): Payer: PRIVATE HEALTH INSURANCE

## 2013-01-28 VITALS — BP 102/64 | Temp 98.7°F | Wt 257.0 lb

## 2013-01-28 DIAGNOSIS — J322 Chronic ethmoidal sinusitis: Secondary | ICD-10-CM

## 2013-01-28 MED ORDER — CEFTRIAXONE SODIUM 1 G IJ SOLR
500.0000 mg | Freq: Once | INTRAMUSCULAR | Status: AC
  Administered 2013-01-28: 500 mg via INTRAMUSCULAR

## 2013-01-28 MED ORDER — AZITHROMYCIN 250 MG PO TABS: ORAL_TABLET | ORAL | Status: DC

## 2013-01-29 NOTE — Progress Notes (Signed)
  Subjective:    Patient ID: Eugene [name], male    DOB: 12-22-1955, 57 y.o.   MRN: [id number]  HPI Patient arrives office feeling poorly. Headache. Congestion. Frontal in nature. Diminished energy. Achy. Sore throat off-and-on. Cough productive at times.   Review of Systems    no vomiting no diarrhea no rash. No measurable fever. ROS otherwise negative. Objective:   Physical Exam  Alert no acute distress. Mild malaise. Vitals reviewed. Frontal and maxillary tenderness and congestion. Pharynx slight erythema. Neck supple. Lungs clear heart regular rate and rhythm.      Assessment & Plan:  Impression acute sinusitis. Plan Z-Pak and Rocephin injection per patient request.

## 2013-01-30 ENCOUNTER — Encounter (HOSPITAL_COMMUNITY): Payer: PRIVATE HEALTH INSURANCE

## 2013-02-02 ENCOUNTER — Encounter (HOSPITAL_COMMUNITY): Payer: PRIVATE HEALTH INSURANCE

## 2013-02-04 ENCOUNTER — Encounter (HOSPITAL_COMMUNITY): Payer: PRIVATE HEALTH INSURANCE

## 2013-02-04 DIAGNOSIS — Z0289 Encounter for other administrative examinations: Secondary | ICD-10-CM

## 2013-02-06 ENCOUNTER — Encounter (HOSPITAL_COMMUNITY): Payer: PRIVATE HEALTH INSURANCE

## 2013-02-07 ENCOUNTER — Emergency Department (HOSPITAL_COMMUNITY)
Admission: EM | Admit: 2013-02-07 | Discharge: 2013-02-07 | Disposition: A | Discharge status: Home / Self Care | Payer: PRIVATE HEALTH INSURANCE | Attending: Emergency Medicine | Admitting: Emergency Medicine

## 2013-02-07 ENCOUNTER — Encounter (HOSPITAL_COMMUNITY): Payer: Self-pay

## 2013-02-07 DIAGNOSIS — Z794 Long term (current) use of insulin: Secondary | ICD-10-CM | POA: Insufficient documentation

## 2013-02-07 DIAGNOSIS — I252 Old myocardial infarction: Secondary | ICD-10-CM | POA: Insufficient documentation

## 2013-02-07 DIAGNOSIS — Z8639 Personal history of other endocrine, nutritional and metabolic disease: Secondary | ICD-10-CM | POA: Insufficient documentation

## 2013-02-07 DIAGNOSIS — I2510 Atherosclerotic heart disease of native coronary artery without angina pectoris: Secondary | ICD-10-CM | POA: Insufficient documentation

## 2013-02-07 DIAGNOSIS — Z8719 Personal history of other diseases of the digestive system: Secondary | ICD-10-CM | POA: Insufficient documentation

## 2013-02-07 DIAGNOSIS — Y9389 Activity, other specified: Secondary | ICD-10-CM | POA: Insufficient documentation

## 2013-02-07 DIAGNOSIS — I509 Heart failure, unspecified: Secondary | ICD-10-CM | POA: Insufficient documentation

## 2013-02-07 DIAGNOSIS — E785 Hyperlipidemia, unspecified: Secondary | ICD-10-CM | POA: Insufficient documentation

## 2013-02-07 DIAGNOSIS — F411 Generalized anxiety disorder: Secondary | ICD-10-CM | POA: Insufficient documentation

## 2013-02-07 DIAGNOSIS — Z79899 Other long term (current) drug therapy: Secondary | ICD-10-CM | POA: Insufficient documentation

## 2013-02-07 DIAGNOSIS — Z8619 Personal history of other infectious and parasitic diseases: Secondary | ICD-10-CM | POA: Insufficient documentation

## 2013-02-07 DIAGNOSIS — T25229D Burn of second degree of unspecified foot, subsequent encounter: Secondary | ICD-10-CM

## 2013-02-07 DIAGNOSIS — Z8669 Personal history of other diseases of the nervous system and sense organs: Secondary | ICD-10-CM | POA: Insufficient documentation

## 2013-02-07 DIAGNOSIS — E1149 Type 2 diabetes mellitus with other diabetic neurological complication: Secondary | ICD-10-CM | POA: Insufficient documentation

## 2013-02-07 DIAGNOSIS — T25229A Burn of second degree of unspecified foot, initial encounter: Secondary | ICD-10-CM | POA: Insufficient documentation

## 2013-02-07 DIAGNOSIS — Z862 Personal history of diseases of the blood and blood-forming organs and certain disorders involving the immune mechanism: Secondary | ICD-10-CM | POA: Insufficient documentation

## 2013-02-07 DIAGNOSIS — J4489 Other specified chronic obstructive pulmonary disease: Secondary | ICD-10-CM | POA: Insufficient documentation

## 2013-02-07 DIAGNOSIS — J449 Chronic obstructive pulmonary disease, unspecified: Secondary | ICD-10-CM | POA: Insufficient documentation

## 2013-02-07 DIAGNOSIS — Z9861 Coronary angioplasty status: Secondary | ICD-10-CM | POA: Insufficient documentation

## 2013-02-07 DIAGNOSIS — G909 Disorder of the autonomic nervous system, unspecified: Secondary | ICD-10-CM | POA: Insufficient documentation

## 2013-02-07 DIAGNOSIS — Y9239 Other specified sports and athletic area as the place of occurrence of the external cause: Secondary | ICD-10-CM | POA: Insufficient documentation

## 2013-02-07 DIAGNOSIS — G4730 Sleep apnea, unspecified: Secondary | ICD-10-CM | POA: Insufficient documentation

## 2013-02-07 DIAGNOSIS — Z87442 Personal history of urinary calculi: Secondary | ICD-10-CM | POA: Insufficient documentation

## 2013-02-07 DIAGNOSIS — Z8679 Personal history of other diseases of the circulatory system: Secondary | ICD-10-CM | POA: Insufficient documentation

## 2013-02-07 DIAGNOSIS — I10 Essential (primary) hypertension: Secondary | ICD-10-CM | POA: Insufficient documentation

## 2013-02-07 DIAGNOSIS — X19XXXA Contact with other heat and hot substances, initial encounter: Secondary | ICD-10-CM | POA: Insufficient documentation

## 2013-02-07 DIAGNOSIS — Z7982 Long term (current) use of aspirin: Secondary | ICD-10-CM | POA: Insufficient documentation

## 2013-02-07 MED ORDER — SILVER SULFADIAZINE 1 % EX CREA
TOPICAL_CREAM | Freq: Once | CUTANEOUS | Status: AC
  Administered 2013-02-07: 16:00:00 via TOPICAL
  Filled 2013-02-07: qty 50
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
## 2013-02-09 ENCOUNTER — Ambulatory Visit (INDEPENDENT_AMBULATORY_CARE_PROVIDER_SITE_OTHER): Payer: PRIVATE HEALTH INSURANCE | Admitting: Family Medicine

## 2013-02-09 ENCOUNTER — Encounter (HOSPITAL_COMMUNITY): Payer: PRIVATE HEALTH INSURANCE

## 2013-02-09 ENCOUNTER — Encounter: Payer: Self-pay | Admitting: Family Medicine

## 2013-02-09 VITALS — BP 102/60 | HR 70

## 2013-02-09 DIAGNOSIS — IMO0002 Reserved for concepts with insufficient information to code with codable children: Secondary | ICD-10-CM

## 2013-02-09 DIAGNOSIS — G609 Hereditary and idiopathic neuropathy, unspecified: Secondary | ICD-10-CM

## 2013-02-09 DIAGNOSIS — G629 Polyneuropathy, unspecified: Secondary | ICD-10-CM
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
## 2013-02-11 ENCOUNTER — Telehealth: Payer: Self-pay | Admitting: Family Medicine

## 2013-02-11 ENCOUNTER — Emergency Department (HOSPITAL_COMMUNITY)
Admission: EM | Admit: 2013-02-11 | Discharge: 2013-02-11 | Disposition: A | Discharge status: Home / Self Care | Payer: PRIVATE HEALTH INSURANCE | Attending: Emergency Medicine | Admitting: Emergency Medicine

## 2013-02-11 ENCOUNTER — Encounter (HOSPITAL_COMMUNITY): Payer: Self-pay

## 2013-02-11 ENCOUNTER — Emergency Department (HOSPITAL_COMMUNITY): Payer: PRIVATE HEALTH INSURANCE

## 2013-02-11 DIAGNOSIS — Z8719 Personal history of other diseases of the digestive system: Secondary | ICD-10-CM | POA: Insufficient documentation

## 2013-02-11 DIAGNOSIS — D649 Anemia, unspecified: Secondary | ICD-10-CM

## 2013-02-11 DIAGNOSIS — J441 Chronic obstructive pulmonary disease with (acute) exacerbation: Secondary | ICD-10-CM | POA: Insufficient documentation

## 2013-02-11 DIAGNOSIS — Z9861 Coronary angioplasty status: Secondary | ICD-10-CM | POA: Insufficient documentation

## 2013-02-11 DIAGNOSIS — Z7982 Long term (current) use of aspirin: Secondary | ICD-10-CM | POA: Insufficient documentation

## 2013-02-11 DIAGNOSIS — I10 Essential (primary) hypertension: Secondary | ICD-10-CM | POA: Insufficient documentation

## 2013-02-11 DIAGNOSIS — Z794 Long term (current) use of insulin: Secondary | ICD-10-CM | POA: Insufficient documentation

## 2013-02-11 DIAGNOSIS — Z862 Personal history of diseases of the blood and blood-forming organs and certain disorders involving the immune mechanism: Secondary | ICD-10-CM | POA: Insufficient documentation

## 2013-02-11 DIAGNOSIS — R609 Edema, unspecified: Secondary | ICD-10-CM | POA: Insufficient documentation

## 2013-02-11 DIAGNOSIS — Z87448 Personal history of other diseases of urinary system: Secondary | ICD-10-CM | POA: Insufficient documentation

## 2013-02-11 DIAGNOSIS — E119 Type 2 diabetes mellitus without complications: Secondary | ICD-10-CM | POA: Insufficient documentation

## 2013-02-11 DIAGNOSIS — N289 Disorder of kidney and ureter, unspecified: Secondary | ICD-10-CM | POA: Insufficient documentation

## 2013-02-11 DIAGNOSIS — Z79899 Other long term (current) drug therapy: Secondary | ICD-10-CM | POA: Insufficient documentation

## 2013-02-11 DIAGNOSIS — Z8639 Personal history of other endocrine, nutritional and metabolic disease: Secondary | ICD-10-CM | POA: Insufficient documentation

## 2013-02-11 DIAGNOSIS — I509 Heart failure, unspecified: Secondary | ICD-10-CM | POA: Insufficient documentation

## 2013-02-11 DIAGNOSIS — J45901 Unspecified asthma with (acute) exacerbation: Secondary | ICD-10-CM | POA: Insufficient documentation

## 2013-02-11 DIAGNOSIS — Z8619 Personal history of other infectious and parasitic diseases: Secondary | ICD-10-CM | POA: Insufficient documentation

## 2013-02-11 DIAGNOSIS — I2510 Atherosclerotic heart disease of native coronary artery without angina pectoris: Secondary | ICD-10-CM | POA: Insufficient documentation

## 2013-02-11 DIAGNOSIS — I252 Old myocardial infarction: Secondary | ICD-10-CM | POA: Insufficient documentation

## 2013-02-11 DIAGNOSIS — Z8669 Personal history of other diseases of the nervous system and sense organs: Secondary | ICD-10-CM | POA: Insufficient documentation

## 2013-02-11 DIAGNOSIS — F411 Generalized anxiety disorder: Secondary | ICD-10-CM | POA: Insufficient documentation

## 2013-02-11 LAB — CBC WITH DIFFERENTIAL/PLATELET
Basophils Absolute: 0 10*3/uL (ref 0.0–0.1)
Basophils Relative: 0 % (ref 0–1)
Eosinophils Absolute: 0.2 10*3/uL (ref 0.0–0.7)
Eosinophils Relative: 2 % (ref 0–5)
HCT: 26.1 % — ABNORMAL LOW (ref 39.0–52.0)
MCH: 30.3 pg (ref 26.0–34.0)
MCHC: 34.9 g/dL (ref 30.0–36.0)
MCV: 87 fL (ref 78.0–100.0)
Monocytes Absolute: 0.8 10*3/uL (ref 0.1–1.0)
RDW: 12.9 % (ref 11.5–15.5)

## 2013-02-11 LAB — TROPONIN I: Troponin I: <0.3 ng/mL (ref <0.30)

## 2013-02-11 LAB — URINALYSIS, ROUTINE W REFLEX MICROSCOPIC
Bilirubin Urine: NEGATIVE
Leukocytes, UA: NEGATIVE
Nitrite: NEGATIVE
Specific Gravity, Urine: 1.025 (ref 1.005–1.030)
pH: 5.5 (ref 5.0–8.0)

## 2013-02-11 LAB — BASIC METABOLIC PANEL
BUN: 52 mg/dL — ABNORMAL HIGH (ref 6–23)
Calcium: 9.2 mg/dL (ref 8.4–10.5)
Creatinine, Ser: 1.5 mg/dL — ABNORMAL HIGH (ref 0.50–1.35)
GFR calc Af Amer: 58 mL/min — ABNORMAL LOW (ref >90)

## 2013-02-11 LAB — URINE MICROSCOPIC-ADD ON

## 2013-02-11 MED ORDER — ALBUTEROL SULFATE HFA 108 (90 BASE) MCG/ACT IN AERS
2.0000 | INHALATION_SPRAY | Freq: Once | RESPIRATORY_TRACT | Status: AC
  Administered 2013-02-11: 2 via RESPIRATORY_TRACT
  Filled 2013-02-11: qty 6.7

## 2013-02-11 MED ORDER — DOXYCYCLINE HYCLATE 100 MG PO TABS: 100.0000 mg | ORAL_TABLET | Freq: Two times a day (BID) | ORAL | Status: DC

## 2013-02-11 MED ORDER — IPRATROPIUM BROMIDE 0.02 % IN SOLN
0.5000 mg | Freq: Once | RESPIRATORY_TRACT | Status: AC
  Administered 2013-02-11: 0.5 mg via RESPIRATORY_TRACT
  Filled 2013-02-11: qty 2.5

## 2013-02-11 MED ORDER — ALBUTEROL SULFATE (5 MG/ML) 0.5% IN NEBU
5.0000 mg | INHALATION_SOLUTION | Freq: Once | RESPIRATORY_TRACT | Status: AC
  Administered 2013-02-11: 5 mg via RESPIRATORY_TRACT
  Filled 2013-02-11: qty 1

## 2013-02-11 MED ORDER — PREDNISONE 20 MG PO TABS: 40.0000 mg | ORAL_TABLET | Freq: Every day | ORAL | Status: DC

## 2013-02-11 MED ORDER — PREDNISONE 50 MG PO TABS
60.0000 mg | ORAL_TABLET | Freq: Once | ORAL | Status: AC
  Administered 2013-02-11: 60 mg via ORAL
  Filled 2013-02-11: qty 1

## 2013-02-11 MED ORDER — ALBUTEROL SULFATE (5 MG/ML) 0.5% IN NEBU
5.0000 mg | INHALATION_SOLUTION | Freq: Once | RESPIRATORY_TRACT | Status: AC
  Administered 2013-02-11: 5 mg via RESPIRATORY_TRACT
  Filled 2013-02-11: qty 0.5

## 2013-02-11 MED ORDER — ALBUTEROL SULFATE (2.5 MG/3ML) 0.083% IN NEBU: 2.5000 mg | INHALATION_SOLUTION | RESPIRATORY_TRACT | Status: DC | PRN

## 2013-02-11 NOTE — ED Notes (Signed)
Patient ambulated in hall, O2 sat 97% on room air. Patient reports feeling very short of breath during ambulation. Dr Thurnell Garbe aware.

## 2013-02-11 NOTE — Telephone Encounter (Signed)
Nurse to call. Pt with history of CHF. If PND or Orthopnea then NTBS today! If just more swelling then can increase Lasix to 2 in am 2 early afternoon until swelling is down. If ongoing or worsening symptoms then follow up

## 2013-02-11 NOTE — Telephone Encounter (Signed)
Patient says since he is keeping his feet up that he is gathering fluid in his feet and its causing him SOB... He would like to know how much lasix he can take?

## 2013-02-11 NOTE — ED Provider Notes (Signed)
History     CSN: [id number]  Arrival date & time 02/11/13  1311   First MD Initiated Contact with Patient 02/11/13 1323      Chief Complaint  Patient presents with  . Shortness of Breath     HPI Pt was seen at 1325.  Per pt, c/o gradual onset and persistence of constant SOB for the past 4 days. Pt states the SOB worsens with laying flat and ambulation.  Pt states he has been taking his lasix without change.  Has been associated with peripheral edema of both of his ankles.  Pt states he "took a dose of my inhaler" with partial relief. Denies CP/palpitations, no cough, no abd pain, no N/V/D, no back pain, no fevers, no calf/LE pain or unilateral swelling.     Past Medical History  Diagnosis Date  . Arteriosclerotic cardiovascular disease (ASCVD)     a. 05/2011 Cath/PCI: LM nl, LAD 93m, D1 small, D2 small 51m, LCX large 40p, RCA 50-60p, 99 hazy @ origin of PDA with 70-80 in PDA (2.5x26 Resolute Integrity & 3.0x15 Resolute Integrity DES).;  b. 08/2012 Inflat  STEMI/Cath/PCI: LM minor irregs, LAD 50p, D1 50, LCX nl, OM1 25, RCA 30-40p, 100d (treated with 2.75x48mm Promus Premier DES);  c. 08/2012 Echo: EF 55-60%, basal inferopost HK.  [name] Hyperlipidemia   . Diabetes mellitus     Peripheral neuropathy  . Bell palsy   . Hypertension   . COPD (chronic obstructive pulmonary disease)   . Sleep apnea   . Gallstones   . Cholelithiasis 07/2012    Asymptomatic; identified incidentally  . PONV (postoperative nausea and vomiting)   . Anxiety   . C. difficile colitis     a. 08/2012  . Myocardial infarct 09/08/12  . Nephrolithiasis   . Contrast dye induced nephropathy     a. 08/2012 post cath/pci  . CHF (congestive heart failure)   . Asthma   . Heart disease     Past Surgical History  Procedure Laterality Date  . Circumcision    . Stents    . Esophagogastroduodenoscopy      in [place] over 20 yrs ago  . Colonoscopy      In [facility], approximately 2011 per patient, was  normal. Advised to come back in 10 years.  . Esophagogastroduodenoscopy  06/12/2012    Procedure: ESOPHAGOGASTRODUODENOSCOPY (EGD);  Surgeon: Daneil Dolin, MD;  Location: [facility];  Service: Endoscopy;  Laterality: N/A;  9:45  . Cardiac catheterization      Family History  Problem Relation Age of Onset  . Diabetes Mother   . Heart attack Mother   . Stroke Mother   . Diabetes Sister   . Sleep apnea Sister   . Hypertension Brother   . Diabetes Brother   . Colon cancer Neg Hx   . Liver disease Neg Hx   . Diabetes Brother   . Hypertension Brother     History  Substance Use Topics  . Smoking status: Never Smoker   . Smokeless tobacco: Not on file  . Alcohol Use: No     Comment: heavy etoh use 30 years ago      Review of Systems ROS: Statement: All systems negative except as marked or noted in the HPI; Constitutional: Negative for fever and chills. ; ; Eyes: Negative for eye pain, redness and discharge. ; ; ENMT: Negative for ear pain, hoarseness, nasal congestion, sinus pressure and sore throat. ; ; Cardiovascular: Negative for chest pain,  palpitations, diaphoresis, +dyspnea and peripheral edema. ; ; Respiratory: Negative for cough, wheezing and stridor. ; ; Gastrointestinal: Negative for nausea, vomiting, diarrhea, abdominal pain, blood in stool, hematemesis, jaundice and rectal bleeding. . ; ; Genitourinary: Negative for dysuria, flank pain and hematuria. ; ; Musculoskeletal: Negative for back pain and neck pain. Negative for swelling and trauma.; ; Skin: Negative for pruritus, rash, abrasions, blisters, bruising and skin lesion.; ; Neuro: Negative for headache, lightheadedness and neck stiffness. Negative for weakness, altered level of consciousness , altered mental status, extremity weakness, paresthesias, involuntary movement, seizure and syncope.       Allergies  Hydrocodone; Lisinopril; Neurontin; and Statins  Home Medications   Current Outpatient Rx  Name  Route   Sig  Dispense  Refill  . ALPRAZolam (XANAX) 0.5 MG tablet   Oral   Take 0.25-0.5 mg by mouth at bedtime as needed. Sleep aid         . aspirin EC 81 MG tablet   Oral   Take 81 mg by mouth at bedtime.          . budesonide-formoterol (SYMBICORT) 160-4.5 MCG/ACT inhaler   Inhalation   Inhale 2 puffs into the lungs 2 (two) times daily.         [name] doxazosin (CARDURA) 4 MG tablet   Oral   Take 1 tablet (4 mg total) by mouth at bedtime.   30 tablet   11   . Flaxseed, Linseed, (FLAXSEED OIL) 1000 MG CAPS   Oral   Take 1,000 mg by mouth 2 (two) times daily.          . furosemide (LASIX) 40 MG tablet   Oral   Take 40 mg by mouth 2 (two) times daily.          . insulin aspart (NOVOLOG) 100 UNIT/ML injection   Subcutaneous   Inject 4-6 Units into the skin 2 (two) times daily. 4 units in am and 6 units at night.         . insulin glargine (LANTUS) 100 UNIT/ML injection   Subcutaneous   Inject 80 Units into the skin at bedtime.          . metoprolol tartrate (LOPRESSOR) 25 MG tablet   Oral   Take 1 tablet (25 mg total) by mouth 2 (two) times daily.   60 tablet   6   . prasugrel (EFFIENT) 10 MG TABS   Oral   Take 1 tablet (10 mg total) by mouth daily.   30 tablet   6   . valsartan (DIOVAN) 160 MG tablet   Oral   Take 1 tablet (160 mg total) by mouth daily.   30 tablet   6   . nitroGLYCERIN (NITROSTAT) 0.4 MG SL tablet   Sublingual   Place 1 tablet (0.4 mg total) under the tongue every 5 (five) minutes x 3 doses as needed for chest pain.   25 tablet   3     BP 111/64  Pulse 86  Temp(Src) 98.2 F (36.8 C) (Oral)  Resp 24  Ht 6\' 1"  (1.854 m)  Wt 254 lb (115.214 kg)  BMI 33.52 kg/m2  SpO2 96%  Physical Exam 1330: Physical examination:  Nursing notes reviewed; Vital signs and O2 SAT reviewed;  Constitutional: Well developed, Well nourished, Well hydrated, In no acute distress; Head:  Normocephalic, atraumatic; Eyes: EOMI, PERRL, No scleral icterus;  ENMT: Mouth and pharynx normal, Mucous membranes moist; Neck: Supple, Full range of motion, No lymphadenopathy;  Cardiovascular: Regular rate and rhythm, No gallop; Respiratory: Breath sounds coarse/diminished & equal bilaterally, No wheezes.  Speaking full sentences with ease, Normal respiratory effort/excursion; Chest: Nontender, Movement normal; Abdomen: Soft, Nontender, Nondistended, Normal bowel sounds; Genitourinary: No CVA tenderness; Extremities: Pulses normal, No tenderness, +1 pedal edema bilat without calf asymmetry.; Neuro: AA&Ox3, Major CN grossly intact.  Speech clear. No gross focal motor or sensory deficits in extremities.; Skin: Color normal, Warm, Dry, +DSD bilat feet.   ED Course  Procedures     MDM  MDM Reviewed: previous chart, nursing note and vitals Reviewed previous: labs and ECG Interpretation: labs, ECG and x-ray      Date: 02/11/2013  Rate: 83  Rhythm: normal sinus rhythm  QRS Axis: normal  Intervals: normal  ST/T Wave abnormalities: normal  Conduction Disutrbances:nonspecific intraventricular conduction delay  Narrative Interpretation:   Old EKG Reviewed: unchanged; no significant changes from previous EKG dated 34/10/2012.  Results for orders placed during the hospital encounter of 02/11/13  URINALYSIS, ROUTINE W REFLEX MICROSCOPIC      Result Value Range   Color, Urine YELLOW  YELLOW   APPearance CLEAR  CLEAR   Specific Gravity, Urine 1.025  1.005 - 1.030   pH 5.5  5.0 - 8.0   Glucose, UA NEGATIVE  NEGATIVE mg/dL   Hgb urine dipstick SMALL (*) NEGATIVE   Bilirubin Urine NEGATIVE  NEGATIVE   Ketones, ur NEGATIVE  NEGATIVE mg/dL   Protein, ur 30 (*) NEGATIVE mg/dL   Urobilinogen, UA 0.2  0.0 - 1.0 mg/dL   Nitrite NEGATIVE  NEGATIVE   Leukocytes, UA NEGATIVE  NEGATIVE  CBC WITH DIFFERENTIAL      Result Value Range   WBC 7.9  4.0 - 10.5 K/uL   RBC 3.00 (*) 4.22 - 5.81 MIL/uL   Hemoglobin 9.1 (*) 13.0 - 17.0 g/dL   HCT 26.1 (*) 39.0 - 52.0 %   MCV  87.0  78.0 - 100.0 fL   MCH 30.3  26.0 - 34.0 pg   MCHC 34.9  30.0 - 36.0 g/dL   RDW 12.9  11.5 - 15.5 %   Platelets 183  150 - 400 K/uL   Neutrophils Relative % 69  43 - 77 %   Neutro Abs 5.4  1.7 - 7.7 K/uL   Lymphocytes Relative 19  12 - 46 %   Lymphs Abs 1.5  0.7 - 4.0 K/uL   Monocytes Relative 10  3 - 12 %   Monocytes Absolute 0.8  0.1 - 1.0 K/uL   Eosinophils Relative 2  0 - 5 %   Eosinophils Absolute 0.2  0.0 - 0.7 K/uL   Basophils Relative 0  0 - 1 %   Basophils Absolute 0.0  0.0 - 0.1 K/uL  BASIC METABOLIC PANEL      Result Value Range   Sodium 133 (*) 135 - 145 mEq/L   Potassium 4.6  3.5 - 5.1 mEq/L   Chloride 95 (*) 96 - 112 mEq/L   CO2 26  19 - 32 mEq/L   Glucose, Bld 165 (*) 70 - 99 mg/dL   BUN 52 (*) 6 - 23 mg/dL   Creatinine, Ser 1.50 (*) 0.50 - 1.35 mg/dL   Calcium 9.2  8.4 - 10.5 mg/dL   GFR calc non Af Amer 50 (*) >90 mL/min   GFR calc Af Amer 58 (*) >90 mL/min  TROPONIN I      Result Value Range   Troponin I <0.30  <0.30 ng/mL  PRO B  NATRIURETIC PEPTIDE      Result Value Range   Pro B Natriuretic peptide (BNP) 205.6 (*) 0 - 125 pg/mL  URINE MICROSCOPIC-ADD ON      Result Value Range   Squamous Epithelial / LPF FEW (*) RARE   WBC, UA 0-2  <3 WBC/hpf   RBC / HPF 0-2  <3 RBC/hpf   Bacteria, UA RARE  RARE   Dg Chest Portable 1 View 02/11/2013   *RADIOLOGY REPORT*  Clinical Data: Shortness of breath.  PORTABLE CHEST - 1 VIEW  Comparison: Chest 11/27/2012.  Findings: Lung volumes are low but the lungs are clear.  Heart size is upper normal.  No pneumothorax or pleural fluid.  IMPRESSION: No acute finding in a low-volume chest.   Original Report Authenticated By: Orlean Patten, M.D.     Results for JHAN, WALBECK (MRN [id number]) as of 02/11/2013 16:03  Ref. Range 09/29/2012 15:53 10/21/2012 19:24 11/07/2012 12:50 11/27/2012 19:55 12/01/2012 13:20 02/11/2013 14:02  BUN Latest Range: 6-23 mg/dL 33 (H) 27 (H) 33 (H) 30 (H) 37 (H) 52 (H)  Creatinine Latest Range:  0.50-1.35 mg/dL 1.15 0.89 0.93 1.10 1.11 1.50 (H)    Results for ALLEC, CLAYDON (MRN [id number]) as of 02/11/2013 16:03  Ref. Range 06/16/2012 21:55 11/27/2012 19:55 02/11/2013 14:02  Pro B Natriuretic peptide (BNP) Latest Range: 0-125 pg/mL 360.1 (H) 357.9 (H) 205.6 (H)   Results for KEVN, TEATER (MRN [id number]) as of 02/11/2013 16:03  Ref. Range 09/13/2012 06:25 09/15/2012 13:30 10/21/2012 19:24 11/27/2012 19:55 02/11/2013 14:02  Hemoglobin Latest Range: 13.0-17.0 g/dL 9.3 (L) 10.1 (L) 11.2 (L) 11.6 (L) 9.1 (L)  HCT Latest Range: 39.0-52.0 % 27.2 (L) 29.3 (L) 30.9 (L) 32.4 (L) 26.1 (L)     1620:  Pt states he "feels better" after neb treatment.  NAD, lungs continue coarse bilat, scattered wheeze, non-productive cough, resps without distress, speaking full sentences, Sats 96% R/A.  Pt ambulated around the ED with Sats remaining 97% R/A, resps without distress. Pt is requesting another neb treatment and wants to go home.  Dx and testing d/w pt and family.  Questions answered.  Verb understanding, agreeable to d/c home with outpt f/u in 2 days.  Will dose with steroid.  Doubt PE as cause for symptoms with low risk Wells.  Doubt ACS as cause for symptoms with normal troponin and unchanged EKG from previous after 4 days of atypical symptoms and no chest pain. H/H per hx anemia. BNP elevated, but improved from previous. CXR without infiltrate or signs of CHF.  Appears exac COPD at this time and will tx for same. RT to dose 2nd neb treatment now, perform teach/treat with MDI. Pt states the family has a neb machine at home and is requesting "some solution for it."  ED RN to ambulate with pulse ox after 2nd neb and MDI. Sign out to Dr. Wyvonnia Dusky.       Alfonzo Feller, DO 02/11/13 1628

## 2013-02-11 NOTE — Telephone Encounter (Signed)
Left message on voicemail to return call ASAP. [place] on cell phone #.
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
## 2013-02-11 NOTE — ED Notes (Signed)
Ambulated pt around nurses desk with pulse ox in place. Pt o2 was 97% and heart-rate was 106. Pt did feel short of breath. NAD noted at this time.

## 2013-02-11 NOTE — ED Notes (Signed)
Resp paged for breathing treatment.  

## 2013-02-11 NOTE — ED Notes (Signed)
Ambulated around nurses station, tolerated well. O2 sat 97% on room air. Patient reports improvement in shortness of breath.

## 2013-02-12 LAB — URINE CULTURE

## 2013-02-16 NOTE — Telephone Encounter (Signed)
Patient was called multiple times and didn't return call

## 2013-02-18 ENCOUNTER — Encounter: Payer: Self-pay | Admitting: Family Medicine

## 2013-02-18 ENCOUNTER — Ambulatory Visit (INDEPENDENT_AMBULATORY_CARE_PROVIDER_SITE_OTHER): Payer: PRIVATE HEALTH INSURANCE | Admitting: Family Medicine

## 2013-02-18 VITALS — BP 132/70 | HR 80 | Ht 73.0 in | Wt 258.4 lb

## 2013-02-18 DIAGNOSIS — D509 Iron deficiency anemia, unspecified: Secondary | ICD-10-CM

## 2013-02-18 DIAGNOSIS — G629 Polyneuropathy, unspecified: Secondary | ICD-10-CM

## 2013-02-18 DIAGNOSIS — E119 Type 2 diabetes mellitus without complications: Secondary | ICD-10-CM

## 2013-02-18 DIAGNOSIS — G609 Hereditary and idiopathic neuropathy, unspecified: Secondary | ICD-10-CM

## 2013-02-18 MED ORDER — SILVER SULFADIAZINE 1 % EX CREA: TOPICAL_CREAM | CUTANEOUS | Status: DC
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
## 2013-02-28 ENCOUNTER — Emergency Department (HOSPITAL_COMMUNITY)
Admission: EM | Admit: 2013-02-28 | Discharge: 2013-02-28 | Disposition: A | Discharge status: Home / Self Care | Payer: PRIVATE HEALTH INSURANCE | Attending: Emergency Medicine | Admitting: Emergency Medicine

## 2013-02-28 ENCOUNTER — Encounter (HOSPITAL_COMMUNITY): Payer: Self-pay | Admitting: *Deleted

## 2013-02-28 ENCOUNTER — Emergency Department (HOSPITAL_COMMUNITY): Payer: PRIVATE HEALTH INSURANCE

## 2013-02-28 DIAGNOSIS — Z79899 Other long term (current) drug therapy: Secondary | ICD-10-CM | POA: Insufficient documentation

## 2013-02-28 DIAGNOSIS — S59909A Unspecified injury of unspecified elbow, initial encounter: Secondary | ICD-10-CM | POA: Insufficient documentation

## 2013-02-28 DIAGNOSIS — Y929 Unspecified place or not applicable: Secondary | ICD-10-CM | POA: Insufficient documentation

## 2013-02-28 DIAGNOSIS — J4489 Other specified chronic obstructive pulmonary disease: Secondary | ICD-10-CM | POA: Insufficient documentation

## 2013-02-28 DIAGNOSIS — G909 Disorder of the autonomic nervous system, unspecified: Secondary | ICD-10-CM | POA: Insufficient documentation

## 2013-02-28 DIAGNOSIS — G4730 Sleep apnea, unspecified: Secondary | ICD-10-CM | POA: Insufficient documentation

## 2013-02-28 DIAGNOSIS — S6990XA Unspecified injury of unspecified wrist, hand and finger(s), initial encounter: Secondary | ICD-10-CM | POA: Insufficient documentation

## 2013-02-28 DIAGNOSIS — Z8679 Personal history of other diseases of the circulatory system: Secondary | ICD-10-CM | POA: Insufficient documentation

## 2013-02-28 DIAGNOSIS — Z794 Long term (current) use of insulin: Secondary | ICD-10-CM | POA: Insufficient documentation

## 2013-02-28 DIAGNOSIS — Z8639 Personal history of other endocrine, nutritional and metabolic disease: Secondary | ICD-10-CM | POA: Insufficient documentation

## 2013-02-28 DIAGNOSIS — I10 Essential (primary) hypertension: Secondary | ICD-10-CM | POA: Insufficient documentation

## 2013-02-28 DIAGNOSIS — Y939 Activity, unspecified: Secondary | ICD-10-CM | POA: Insufficient documentation

## 2013-02-28 DIAGNOSIS — Z8719 Personal history of other diseases of the digestive system: Secondary | ICD-10-CM | POA: Insufficient documentation

## 2013-02-28 DIAGNOSIS — Z87442 Personal history of urinary calculi: Secondary | ICD-10-CM | POA: Insufficient documentation

## 2013-02-28 DIAGNOSIS — Z7982 Long term (current) use of aspirin: Secondary | ICD-10-CM | POA: Insufficient documentation

## 2013-02-28 DIAGNOSIS — E1149 Type 2 diabetes mellitus with other diabetic neurological complication: Secondary | ICD-10-CM | POA: Insufficient documentation

## 2013-02-28 DIAGNOSIS — X58XXXA Exposure to other specified factors, initial encounter: Secondary | ICD-10-CM | POA: Insufficient documentation

## 2013-02-28 DIAGNOSIS — Z87448 Personal history of other diseases of urinary system: Secondary | ICD-10-CM | POA: Insufficient documentation

## 2013-02-28 DIAGNOSIS — I509 Heart failure, unspecified: Secondary | ICD-10-CM | POA: Insufficient documentation

## 2013-02-28 DIAGNOSIS — F411 Generalized anxiety disorder: Secondary | ICD-10-CM | POA: Insufficient documentation

## 2013-02-28 DIAGNOSIS — J449 Chronic obstructive pulmonary disease, unspecified: Secondary | ICD-10-CM | POA: Insufficient documentation

## 2013-02-28 DIAGNOSIS — Z9861 Coronary angioplasty status: Secondary | ICD-10-CM | POA: Insufficient documentation

## 2013-02-28 DIAGNOSIS — Z8669 Personal history of other diseases of the nervous system and sense organs: Secondary | ICD-10-CM | POA: Insufficient documentation

## 2013-02-28 DIAGNOSIS — I252 Old myocardial infarction: Secondary | ICD-10-CM | POA: Insufficient documentation

## 2013-02-28 DIAGNOSIS — M25531 Pain in right wrist: Secondary | ICD-10-CM

## 2013-02-28 DIAGNOSIS — M7989 Other specified soft tissue disorders: Secondary | ICD-10-CM

## 2013-02-28 DIAGNOSIS — Z862 Personal history of diseases of the blood and blood-forming organs and certain disorders involving the immune mechanism: Secondary | ICD-10-CM | POA: Insufficient documentation

## 2013-02-28 MED ORDER — OXYCODONE-ACETAMINOPHEN 5-325 MG PO TABS
1.0000 | ORAL_TABLET | Freq: Once | ORAL | Status: AC
  Administered 2013-02-28: 1 via ORAL
  Filled 2013-02-28: qty 1

## 2013-02-28 MED ORDER — OXYCODONE-ACETAMINOPHEN 5-325 MG PO TABS: 1.0000 | ORAL_TABLET | ORAL | Status: DC | PRN

## 2013-02-28 MED ORDER — IBUPROFEN 800 MG PO TABS
800.0000 mg | ORAL_TABLET | Freq: Once | ORAL | Status: AC
  Administered 2013-02-28: 800 mg via ORAL
  Filled 2013-02-28: qty 1

## 2013-02-28 NOTE — ED Provider Notes (Signed)
History    CSN: [id number] Arrival date & time 02/28/13  0309  First MD Initiated Contact with Patient 02/28/13 [phone]     Chief Complaint  Patient presents with  . Wrist Pain   (Consider location/radiation/quality/duration/timing/severity/associated sxs/prior Treatment) HPI  HPI Comments: Carry Mcrea Branford is a 57 y.o. male with a h/o HTN, DM, COPD, ASCVD who presents to the Emergency Department complaining of right wrist pain. He almost fell yesterday and caught himself with his walking stick. He feels he may have put too much pressure on his right wrist when he caught himself.  He woke this morning with a swollen hand and painful with movement of his wrist. He has taken tylenol. The wrist is painful with movement and with touch.   PCP Dr. [name] Past Medical History  Diagnosis Date  . Arteriosclerotic cardiovascular disease (ASCVD)     a. 05/2011 Cath/PCI: LM nl, LAD 70m, D1 small, D2 small 7m, LCX large 40p, RCA 50-60p, 99 hazy @ origin of PDA with 70-80 in PDA (2.5x26 Resolute Integrity & 3.0x15 Resolute Integrity DES).;  b. 08/2012 Inflat  STEMI/Cath/PCI: LM minor irregs, LAD 50p, D1 50, LCX nl, OM1 25, RCA 30-40p, 100d (treated with 2.75x78mm Promus Premier DES);  c. 08/2012 Echo: EF 55-60%, basal inferopost HK.  [name] Hyperlipidemia   . Diabetes mellitus     Peripheral neuropathy  . Bell palsy   . Hypertension   . COPD (chronic obstructive pulmonary disease)   . Sleep apnea   . Gallstones   . Cholelithiasis 07/2012    Asymptomatic; identified incidentally  . PONV (postoperative nausea and vomiting)   . Anxiety   . C. difficile colitis     a. 08/2012  . Myocardial infarct 09/08/12  . Nephrolithiasis   . Contrast dye induced nephropathy     a. 08/2012 post cath/pci  . CHF (congestive heart failure)   . Asthma   . Heart disease    Past Surgical History  Procedure Laterality Date  . Circumcision    . Stents    . Esophagogastroduodenoscopy      in [place] over 20 yrs  ago  . Colonoscopy      In [facility], approximately 2011 per patient, was normal. Advised to come back in 10 years.  . Esophagogastroduodenoscopy  06/12/2012    Procedure: ESOPHAGOGASTRODUODENOSCOPY (EGD);  Surgeon: Daneil Dolin, MD;  Location: [facility];  Service: Endoscopy;  Laterality: N/A;  9:45  . Cardiac catheterization     Family History  Problem Relation Age of Onset  . Diabetes Mother   . Heart attack Mother   . Stroke Mother   . Diabetes Sister   . Sleep apnea Sister   . Hypertension Brother   . Diabetes Brother   . Colon cancer Neg Hx   . Liver disease Neg Hx   . Diabetes Brother   . Hypertension Brother    History  Substance Use Topics  . Smoking status: Never Smoker   . Smokeless tobacco: Not on file  . Alcohol Use: No     Comment: heavy etoh use 30 years ago    Review of Systems  Constitutional: Negative for fever.       10 Systems reviewed and are negative for acute change except as noted in the HPI.  HENT: Negative for congestion.   Eyes: Negative for discharge and redness.  Respiratory: Negative for cough and shortness of breath.   Cardiovascular: Negative for chest pain.  Gastrointestinal: Negative  for vomiting and abdominal pain.  Musculoskeletal: Negative for back pain.       Right hand swelling, right wrist pain  Skin: Negative for rash.  Neurological: Negative for syncope, numbness and headaches.  Psychiatric/Behavioral:       No behavior change.    Allergies  Hydrocodone; Lisinopril; Neurontin; and Statins  Home Medications   Current Outpatient Rx  Name  Route  Sig  Dispense  Refill  . albuterol (PROVENTIL) (2.5 MG/3ML) 0.083% nebulizer solution   Nebulization   Take 3 mLs (2.5 mg total) by nebulization every 4 (four) hours as needed for wheezing.   75 mL   0   . ALPRAZolam (XANAX) 0.5 MG tablet   Oral   Take 0.25-0.5 mg by mouth at bedtime as needed. Sleep aid         . aspirin EC 81 MG tablet   Oral   Take 81  mg by mouth at bedtime.          . budesonide-formoterol (SYMBICORT) 160-4.5 MCG/ACT inhaler   Inhalation   Inhale 2 puffs into the lungs 2 (two) times daily.         [name] doxazosin (CARDURA) 4 MG tablet   Oral   Take 1 tablet (4 mg total) by mouth at bedtime.   30 tablet   11   . Flaxseed, Linseed, (FLAXSEED OIL) 1000 MG CAPS   Oral   Take 1,000 mg by mouth 2 (two) times daily.          . furosemide (LASIX) 40 MG tablet   Oral   Take 40 mg by mouth 2 (two) times daily.          . insulin aspart (NOVOLOG) 100 UNIT/ML injection   Subcutaneous   Inject 4-6 Units into the skin 2 (two) times daily. 4 units in am and 6 units at night.         . insulin glargine (LANTUS) 100 UNIT/ML injection   Subcutaneous   Inject 80 Units into the skin at bedtime.          . metoprolol tartrate (LOPRESSOR) 25 MG tablet   Oral   Take 1 tablet (25 mg total) by mouth 2 (two) times daily.   60 tablet   6   . nitroGLYCERIN (NITROSTAT) 0.4 MG SL tablet   Sublingual   Place 1 tablet (0.4 mg total) under the tongue every 5 (five) minutes x 3 doses as needed for chest pain.   25 tablet   3   . prasugrel (EFFIENT) 10 MG TABS   Oral   Take 1 tablet (10 mg total) by mouth daily.   30 tablet   6   . silver sulfADIAZINE (SILVADENE) 1 % cream      Apply to affected area daily   100 g   1   . valsartan (DIOVAN) 160 MG tablet   Oral   Take 1 tablet (160 mg total) by mouth daily.   30 tablet   6   . doxycycline (VIBRA-TABS) 100 MG tablet   Oral   Take 1 tablet (100 mg total) by mouth 2 (two) times daily.   14 tablet   0   . predniSONE (DELTASONE) 20 MG tablet   Oral   Take 2 tablets (40 mg total) by mouth daily. Start 02/12/2013   10 tablet   0    BP 157/76  Pulse 86  Temp(Src) 98.7 F (37.1 C) (Oral)  Resp 20  Ht 6'  1" (1.854 m)  Wt 248 lb (112.492 kg)  BMI 32.73 kg/m2  SpO2 97% Physical Exam  Nursing note and vitals reviewed. Constitutional: He appears  well-developed and well-nourished.  Awake, alert, nontoxic appearance.  HENT:  Head: Normocephalic and atraumatic.  Eyes: EOM are normal. Pupils are equal, round, and reactive to light.  Neck: Neck supple.  Cardiovascular: Normal rate and intact distal pulses.   Pulmonary/Chest: Effort normal and breath sounds normal. He exhibits no tenderness.  Abdominal: Soft. Bowel sounds are normal. There is no tenderness. There is no rebound.  Musculoskeletal: He exhibits no tenderness.  Baseline ROM, no obvious new focal weakness. Right hand with swelling to the dorsum of the hand. Moves all fingers. Pulses 2+. Painful with touch to the lateral radius area. No bruising present. FROM of the wrist with discomfort.   Neurological:  Mental status and motor strength appears baseline for patient and situation.  Skin: No rash noted.  Psychiatric: He has a normal mood and affect.    ED Course  Procedures (including critical care time)  Medications  ibuprofen (ADVIL,MOTRIN) tablet 800 mg (not administered)  oxyCODONE-acetaminophen (PERCOCET/ROXICET) 5-325 MG per tablet 1 tablet (not administered)   Dg Wrist Complete Right  02/28/2013   *RADIOLOGY REPORT*  Clinical Data: Right wrist pain for 1 day.  Fell yesterday.  RIGHT WRIST - COMPLETE 3+ VIEW  Comparison: None.  Findings: Calcification in the region of the triangular fiber cartilage is probably chronic degenerative change.  Degenerative changes in the STT and first carpometacarpal joints.  Vascular calcifications.  There is dorsal soft tissue swelling with a focal calcification arising from the dorsal aspect of the radial metaphysis.  This could represent a small avulsion fracture.  No displaced fractures identified.  IMPRESSION: Degenerative changes in the right wrist.  Dorsal soft tissue swelling with tiny bone fragment adjacent to the distal radial metaphysis dorsally may represent avulsion fracture.   Original Report Authenticated By: Lucienne Capers,  M.D.     MDM  Patient presents with swollen right hand and pain in the wrist after catching himself using his walking stick yesterday. Xrays show degenerative changes in the wrist and no fractures.Reviewed results with the patient. He had been given ibuprofen and percocet. Pt stable in ED with no significant deterioration in condition.The patient appears reasonably screened and/or stabilized for discharge and I doubt any other medical condition or other [facility] requiring further screening, evaluation, or treatment in the ED at this time prior to discharge.  MDM Reviewed: nursing note and vitals Interpretation: x-ray     Gypsy Balsam. Olin Hauser, MD 02/28/13 [id number]

## 2013-02-28 NOTE — ED Notes (Signed)
Pt alert & oriented x4, stable gait. Patient given discharge instructions, paperwork & prescription(s). Patient  instructed to stop at the registration desk to finish any additional paperwork. Patient verbalized understanding. Pt left department w/ no further questions. 

## 2013-02-28 NOTE — ED Notes (Signed)
Pt states he almost fell yesterday & caught himself w/ his walking stick, thinks he might have put too much pressure on right wrist.

## 2013-03-18 LAB — CBC WITH DIFFERENTIAL/PLATELET
HCT: 24.8 % — ABNORMAL LOW (ref 39.0–52.0)
Hemoglobin: 8.3 g/dL — ABNORMAL LOW (ref 13.0–17.0)
Lymphocytes Relative: 35 % (ref 12–46)
Lymphs Abs: 1.7 10*3/uL (ref 0.7–4.0)
Monocytes Absolute: 0.5 10*3/uL (ref 0.1–1.0)
Monocytes Relative: 10 % (ref 3–12)
Neutro Abs: 2.6 10*3/uL (ref 1.7–7.7)
RBC: 2.96 MIL/uL — ABNORMAL LOW (ref 4.22–5.81)
WBC: 5 10*3/uL (ref 4.0–10.5)

## 2013-03-18 LAB — IRON AND TIBC
Iron: 33 ug/dL — ABNORMAL LOW (ref 42–165)
TIBC: 184 ug/dL — ABNORMAL LOW (ref 215–435)
UIBC: 151 ug/dL (ref 125–400)

## 2013-03-18 LAB — FERRITIN: Ferritin: 299 ng/mL (ref 22–322)

## 2013-03-27 ENCOUNTER — Ambulatory Visit: Payer: PRIVATE HEALTH INSURANCE | Admitting: Family Medicine

## 2013-03-30 ENCOUNTER — Telehealth: Payer: Self-pay | Admitting: *Deleted

## 2013-03-30 NOTE — Telephone Encounter (Signed)
Advised pt that samples have been placed at the front desk for his pick up, pt requested that his step brother richard chapell pick up the samples for him per pt in wheelchair and hard to get out, advised samples left at front desk for pick up

## 2013-03-30 NOTE — Telephone Encounter (Signed)
Pt is calling for effient samples/ We do have some I told him to wait on nurse call before coming to pick up.

## 2013-03-31 ENCOUNTER — Telehealth: Payer: Self-pay | Admitting: Family Medicine

## 2013-03-31 MED ORDER — INSULIN GLARGINE 100 UNIT/ML ~~LOC~~ SOLN: 80.0000 [IU] | Freq: Every day | SUBCUTANEOUS | Status: DC

## 2013-03-31 MED ORDER — INSULIN ASPART 100 UNIT/ML ~~LOC~~ SOLN: 4.0000 [IU] | Freq: Two times a day (BID) | SUBCUTANEOUS | Status: DC

## 2013-03-31 NOTE — Telephone Encounter (Signed)
Patient needs Rx for insulin. He is requesting to have additional refills added so he doesn't have to call every month.  [employer]

## 2013-03-31 NOTE — Addendum Note (Signed)
Addended by: Dairl Ponder on: 03/31/2013 01:52 PM   Modules accepted: Orders

## 2013-03-31 NOTE — Telephone Encounter (Signed)
Rx sent electronically to Pharmacy. Patient notified.

## 2013-04-02 ENCOUNTER — Emergency Department (HOSPITAL_COMMUNITY)
Admission: EM | Admit: 2013-04-02 | Discharge: 2013-04-03 | Disposition: A | Discharge status: Home / Self Care | Payer: PRIVATE HEALTH INSURANCE | Attending: Emergency Medicine | Admitting: Emergency Medicine

## 2013-04-02 ENCOUNTER — Emergency Department (HOSPITAL_COMMUNITY): Payer: PRIVATE HEALTH INSURANCE

## 2013-04-02 ENCOUNTER — Encounter (HOSPITAL_COMMUNITY): Payer: Self-pay | Admitting: Emergency Medicine

## 2013-04-02 DIAGNOSIS — E119 Type 2 diabetes mellitus without complications: Secondary | ICD-10-CM | POA: Insufficient documentation

## 2013-04-02 DIAGNOSIS — J45909 Unspecified asthma, uncomplicated: Secondary | ICD-10-CM | POA: Insufficient documentation

## 2013-04-02 DIAGNOSIS — Z87448 Personal history of other diseases of urinary system: Secondary | ICD-10-CM | POA: Insufficient documentation

## 2013-04-02 DIAGNOSIS — Z8679 Personal history of other diseases of the circulatory system: Secondary | ICD-10-CM | POA: Insufficient documentation

## 2013-04-02 DIAGNOSIS — J4489 Other specified chronic obstructive pulmonary disease: Secondary | ICD-10-CM | POA: Insufficient documentation

## 2013-04-02 DIAGNOSIS — Z8719 Personal history of other diseases of the digestive system: Secondary | ICD-10-CM | POA: Insufficient documentation

## 2013-04-02 DIAGNOSIS — Z87442 Personal history of urinary calculi: Secondary | ICD-10-CM | POA: Insufficient documentation

## 2013-04-02 DIAGNOSIS — I10 Essential (primary) hypertension: Secondary | ICD-10-CM | POA: Insufficient documentation

## 2013-04-02 DIAGNOSIS — R5381 Other malaise: Secondary | ICD-10-CM | POA: Insufficient documentation

## 2013-04-02 DIAGNOSIS — Z79899 Other long term (current) drug therapy: Secondary | ICD-10-CM | POA: Insufficient documentation

## 2013-04-02 DIAGNOSIS — R197 Diarrhea, unspecified: Secondary | ICD-10-CM

## 2013-04-02 DIAGNOSIS — Z8619 Personal history of other infectious and parasitic diseases: Secondary | ICD-10-CM | POA: Insufficient documentation

## 2013-04-02 DIAGNOSIS — F411 Generalized anxiety disorder: Secondary | ICD-10-CM | POA: Insufficient documentation

## 2013-04-02 DIAGNOSIS — Z8639 Personal history of other endocrine, nutritional and metabolic disease: Secondary | ICD-10-CM | POA: Insufficient documentation

## 2013-04-02 DIAGNOSIS — J449 Chronic obstructive pulmonary disease, unspecified: Secondary | ICD-10-CM | POA: Insufficient documentation

## 2013-04-02 DIAGNOSIS — Z8669 Personal history of other diseases of the nervous system and sense organs: Secondary | ICD-10-CM | POA: Insufficient documentation

## 2013-04-02 DIAGNOSIS — Z794 Long term (current) use of insulin: Secondary | ICD-10-CM | POA: Insufficient documentation

## 2013-04-02 DIAGNOSIS — I252 Old myocardial infarction: Secondary | ICD-10-CM | POA: Insufficient documentation

## 2013-04-02 DIAGNOSIS — Z862 Personal history of diseases of the blood and blood-forming organs and certain disorders involving the immune mechanism: Secondary | ICD-10-CM | POA: Insufficient documentation

## 2013-04-02 DIAGNOSIS — I509 Heart failure, unspecified: Secondary | ICD-10-CM | POA: Insufficient documentation

## 2013-04-02 LAB — CBC WITH DIFFERENTIAL/PLATELET
Basophils Absolute: 0 10*3/uL (ref 0.0–0.1)
Basophils Relative: 0 % (ref 0–1)
Eosinophils Relative: 2 % (ref 0–5)
Lymphocytes Relative: 28 % (ref 12–46)
MCHC: 33.9 g/dL (ref 30.0–36.0)
Neutro Abs: 4.4 10*3/uL (ref 1.7–7.7)
Platelets: 175 10*3/uL (ref 150–400)
RDW: 14.4 % (ref 11.5–15.5)
WBC: 7 10*3/uL (ref 4.0–10.5)

## 2013-04-02 LAB — COMPREHENSIVE METABOLIC PANEL
ALT: 8 U/L (ref 0–53)
AST: 12 U/L (ref 0–37)
Albumin: 3.2 g/dL — ABNORMAL LOW (ref 3.5–5.2)
CO2: 17 mEq/L — ABNORMAL LOW (ref 19–32)
Calcium: 9.2 mg/dL (ref 8.4–10.5)
Chloride: 102 mEq/L (ref 96–112)
Creatinine, Ser: 1.59 mg/dL — ABNORMAL HIGH (ref 0.50–1.35)
GFR calc non Af Amer: 47 mL/min — ABNORMAL LOW (ref >90)
Sodium: 131 mEq/L — ABNORMAL LOW (ref 135–145)

## 2013-04-02 LAB — OCCULT BLOOD X 1 CARD TO LAB, STOOL: Fecal Occult Bld: NEGATIVE

## 2013-04-02 MED ORDER — SODIUM CHLORIDE 0.9 % IV BOLUS (SEPSIS)
250.0000 mL | Freq: Once | INTRAVENOUS | Status: AC
  Administered 2013-04-02: 250 mL via INTRAVENOUS

## 2013-04-02 MED ORDER — SODIUM CHLORIDE 0.9 % IV SOLN
Freq: Once | INTRAVENOUS | Status: AC
  Administered 2013-04-02: 100 mL/h via INTRAVENOUS

## 2013-04-02 NOTE — ED Notes (Signed)
Patient complaining of diarrhea x 4 days. Complaining of weakness. States "I think I may be dehydrated."

## 2013-04-02 NOTE — ED Provider Notes (Signed)
CSN: [id number]     Arrival date & time 04/02/13  2114 History     First MD Initiated Contact with Patient 04/02/13 2220     Chief Complaint  Patient presents with  . Diarrhea   (Consider location/radiation/quality/duration/timing/severity/associated sxs/prior Treatment) HPI Comments: Eugene [name] is a 57 y.o. male who presents to the Emergency Department complaining of diarrhea for 4 days.  States he was constipated for several days and had a small, hard, dry stool on Tuesday and reports frequent, watery stools since. Was taking Milk of Magnesia for the constipation.  He states that he feels "dehydrated and gassy" and having generalized weakness.  Symptoms are not affected by food.  He denies recent antibiotics, fever, vomiting, chest pain, bloody stools, black or tarry stools, or abdominal pain.  He has tried OTC Imodium w/o relief.   He also reports taking Effient since having an MI with stent placement in January of this year.    Patient is a 57 y.o. male presenting with diarrhea. The history is provided by the patient.  Diarrhea Quality:  Watery Severity:  Moderate Onset quality:  Sudden Number of episodes:  Multiple Duration:  4 days Timing:  Constant Progression:  Unchanged Relieved by:  Nothing Worsened by:  Nothing tried Ineffective treatments:  Anti-motility medications Associated symptoms: no abdominal pain, no arthralgias, no chills, no recent cough, no diaphoresis, no fever, no headaches, no myalgias, no URI and no vomiting   Risk factors: no recent antibiotic use, no sick contacts, no suspicious food intake and no travel to endemic areas     Past Medical History  Diagnosis Date  . Arteriosclerotic cardiovascular disease (ASCVD)     a. 05/2011 Cath/PCI: LM nl, LAD 86m, D1 small, D2 small 72m, LCX large 40p, RCA 50-60p, 99 hazy @ origin of PDA with 70-80 in PDA (2.5x26 Resolute Integrity & 3.0x15 Resolute Integrity DES).;  b. 08/2012 Inflat  STEMI/Cath/PCI: LM minor  irregs, LAD 50p, D1 50, LCX nl, OM1 25, RCA 30-40p, 100d (treated with 2.75x37mm Promus Premier DES);  c. 08/2012 Echo: EF 55-60%, basal inferopost HK.  [name] Hyperlipidemia   . Diabetes mellitus     Peripheral neuropathy  . Bell palsy   . Hypertension   . COPD (chronic obstructive pulmonary disease)   . Sleep apnea   . Gallstones   . Cholelithiasis 07/2012    Asymptomatic; identified incidentally  . PONV (postoperative nausea and vomiting)   . Anxiety   . C. difficile colitis     a. 08/2012  . Myocardial infarct 09/08/12  . Nephrolithiasis   . Contrast dye induced nephropathy     a. 08/2012 post cath/pci  . CHF (congestive heart failure)   . Asthma   . Heart disease    Past Surgical History  Procedure Laterality Date  . Circumcision    . Stents    . Esophagogastroduodenoscopy      in [place] over 20 yrs ago  . Colonoscopy      In [facility], approximately 2011 per patient, was normal. Advised to come back in 10 years.  . Esophagogastroduodenoscopy  06/12/2012    Procedure: ESOPHAGOGASTRODUODENOSCOPY (EGD);  Surgeon: Daneil Dolin, MD;  Location: [facility];  Service: Endoscopy;  Laterality: N/A;  9:45  . Cardiac catheterization     Family History  Problem Relation Age of Onset  . Diabetes Mother   . Heart attack Mother   . Stroke Mother   . Diabetes Sister   . Sleep  apnea Sister   . Hypertension Brother   . Diabetes Brother   . Colon cancer Neg Hx   . Liver disease Neg Hx   . Diabetes Brother   . Hypertension Brother    History  Substance Use Topics  . Smoking status: Never Smoker   . Smokeless tobacco: Not on file  . Alcohol Use: No     Comment: heavy etoh use 30 years ago    Review of Systems  Constitutional: Negative for fever, chills, diaphoresis and appetite change.  HENT: Negative for trouble swallowing.   Respiratory: Negative for chest tightness and shortness of breath.   Cardiovascular: Negative for chest pain.  Gastrointestinal:  Positive for diarrhea and abdominal distention. Negative for nausea, vomiting, abdominal pain, blood in stool, anal bleeding and rectal pain.  Genitourinary: Negative for dysuria, hematuria, flank pain, decreased urine volume and difficulty urinating.  Musculoskeletal: Negative for myalgias, back pain and arthralgias.  Skin: Negative for color change and rash.  Neurological: Positive for weakness. Negative for dizziness, syncope, speech difficulty, numbness and headaches.  Hematological: Negative for adenopathy.  All other systems reviewed and are negative.    Allergies  Hydrocodone; Lisinopril; Neurontin; and Statins  Home Medications   Current Outpatient Rx  Name  Route  Sig  Dispense  Refill  . albuterol (PROVENTIL) (2.5 MG/3ML) 0.083% nebulizer solution   Nebulization   Take 3 mLs (2.5 mg total) by nebulization every 4 (four) hours as needed for wheezing.   75 mL   0   . ALPRAZolam (XANAX) 0.5 MG tablet   Oral   Take 0.25-0.5 mg by mouth at bedtime as needed. Sleep aid         . aspirin EC 81 MG tablet   Oral   Take 81 mg by mouth at bedtime.          . budesonide-formoterol (SYMBICORT) 160-4.5 MCG/ACT inhaler   Inhalation   Inhale 2 puffs into the lungs 2 (two) times daily.         [name] doxazosin (CARDURA) 4 MG tablet   Oral   Take 1 tablet (4 mg total) by mouth at bedtime.   30 tablet   11   . Flaxseed, Linseed, (FLAXSEED OIL) 1000 MG CAPS   Oral   Take 1,000 mg by mouth 2 (two) times daily.          . furosemide (LASIX) 40 MG tablet   Oral   Take 40 mg by mouth 2 (two) times daily.          . insulin aspart (NOVOLOG) 100 UNIT/ML injection   Subcutaneous   Inject 4-6 Units into the skin 2 (two) times daily. 4 units in am and 6 units at night.   1 vial   2   . insulin glargine (LANTUS) 100 UNIT/ML injection   Subcutaneous   Inject 0.8 mLs (80 Units total) into the skin at bedtime.   10 mL   2   . metoprolol tartrate (LOPRESSOR) 25 MG tablet    Oral   Take 1 tablet (25 mg total) by mouth 2 (two) times daily.   60 tablet   6   . prasugrel (EFFIENT) 10 MG TABS   Oral   Take 1 tablet (10 mg total) by mouth daily.   30 tablet   6   . silver sulfADIAZINE (SILVADENE) 1 % cream      Apply to affected area daily   100 g   1   .  valsartan (DIOVAN) 160 MG tablet   Oral   Take 1 tablet (160 mg total) by mouth daily.   30 tablet   6   . nitroGLYCERIN (NITROSTAT) 0.4 MG SL tablet   Sublingual   Place 1 tablet (0.4 mg total) under the tongue every 5 (five) minutes x 3 doses as needed for chest pain.   25 tablet   3    BP 119/67  Pulse 91  Temp(Src) 98.1 F (36.7 C) (Oral)  Resp 18  Ht 6\' 1"  (1.854 m)  Wt 254 lb (115.214 kg)  BMI 33.52 kg/m2  SpO2 98% Physical Exam  Nursing note and vitals reviewed. Constitutional: He is oriented to person, place, and time. He appears well-developed and well-nourished. No distress.  HENT:  Head: Normocephalic and atraumatic.  Mouth/Throat: Uvula is midline, oropharynx is clear and moist and mucous membranes are normal. Normal dentition.  Eyes: Conjunctivae and EOM are normal. Pupils are equal, round, and reactive to light. No scleral icterus.  Neck: Normal range of motion. Neck supple.  Cardiovascular: Normal rate, regular rhythm, normal heart sounds and intact distal pulses.   No murmur heard. Pulmonary/Chest: Effort normal and breath sounds normal. No respiratory distress. He has no wheezes. He has no rales. He exhibits no tenderness.  Abdominal: Soft. Normal appearance. He exhibits distension. He exhibits no ascites, no pulsatile midline mass and no mass. Bowel sounds are increased. There is no hepatosplenomegaly. There is no tenderness. There is no rebound and no guarding.  Musculoskeletal: Normal range of motion. He exhibits no tenderness.  Bilateral 1+ pitting edema of the LE's that pt states is chronic  Lymphadenopathy:    He has no cervical adenopathy.  Neurological: He is  alert and oriented to person, place, and time. He exhibits normal muscle tone. Coordination normal.  Skin: Skin is warm and dry.    ED Course   Procedures (including critical care time)  Results for orders placed during the hospital encounter of 04/02/13  CLOSTRIDIUM DIFFICILE BY PCR      Result Value Range   C difficile by pcr NEGATIVE  NEGATIVE  CBC WITH DIFFERENTIAL      Result Value Range   WBC 7.0  4.0 - 10.5 K/uL   RBC 3.50 (*) 4.22 - 5.81 MIL/uL   Hemoglobin 10.2 (*) 13.0 - 17.0 g/dL   HCT 30.1 (*) 39.0 - 52.0 %   MCV 86.0  78.0 - 100.0 fL   MCH 29.1  26.0 - 34.0 pg   MCHC 33.9  30.0 - 36.0 g/dL   RDW 14.4  11.5 - 15.5 %   Platelets 175  150 - 400 K/uL   Neutrophils Relative % 63  43 - 77 %   Neutro Abs 4.4  1.7 - 7.7 K/uL   Lymphocytes Relative 28  12 - 46 %   Lymphs Abs 2.0  0.7 - 4.0 K/uL   Monocytes Relative 7  3 - 12 %   Monocytes Absolute 0.5  0.1 - 1.0 K/uL   Eosinophils Relative 2  0 - 5 %   Eosinophils Absolute 0.1  0.0 - 0.7 K/uL   Basophils Relative 0  0 - 1 %   Basophils Absolute 0.0  0.0 - 0.1 K/uL  COMPREHENSIVE METABOLIC PANEL      Result Value Range   Sodium 131 (*) 135 - 145 mEq/L   Potassium 5.0  3.5 - 5.1 mEq/L   Chloride 102  96 - 112 mEq/L   CO2 17 (*)  19 - 32 mEq/L   Glucose, Bld 189 (*) 70 - 99 mg/dL   BUN 36 (*) 6 - 23 mg/dL   Creatinine, Ser 1.59 (*) 0.50 - 1.35 mg/dL   Calcium 9.2  8.4 - 10.5 mg/dL   Total Protein 7.1  6.0 - 8.3 g/dL   Albumin 3.2 (*) 3.5 - 5.2 g/dL   AST 12  0 - 37 U/L   ALT 8  0 - 53 U/L   Alkaline Phosphatase 74  39 - 117 U/L   Total Bilirubin 0.4  0.3 - 1.2 mg/dL   GFR calc non Af Amer 47 (*) >90 mL/min   GFR calc Af Amer 54 (*) >90 mL/min  OCCULT BLOOD X 1 CARD TO LAB, STOOL      Result Value Range   Fecal Occult Bld NEGATIVE  NEGATIVE    Dg Abd Acute W/chest  04/02/2013   *RADIOLOGY REPORT*  Clinical Data: Diarrhea for 2 days.  ACUTE ABDOMEN SERIES (ABDOMEN 2 VIEW & CHEST 1 VIEW)  Comparison: CT abdomen and  pelvis 06/01/2012 and chest and two views abdomen and 06/13.  Findings: Single view of the chest demonstrates clear lungs and normal heart size.  No pneumothorax or pleural fluid.  Two views of the abdomen show no free intraperitoneal air.  The bowel gas pattern is normal.  IMPRESSION: Negative exam.   Original Report Authenticated By: Orlean Patten, M.D.    MDM    Patient has received IVF's and is feeling better.  Labs and imaging reviewed and discussed with the EDP. Likely mild dehydration, no clinical sx's of DKA.  Non-toxic appearing and abdomen remains soft, NT.    Patient appears stable for discharge and will give BRAT diet instructions and he agrees to close f/u with his PCP.  Strict return precautions also given and pt agrees   Shireen Rayburn L. Vanessa [place], PA-C 04/03/13 0119

## 2013-04-03 LAB — URINE MICROSCOPIC-ADD ON

## 2013-04-03 LAB — URINALYSIS, ROUTINE W REFLEX MICROSCOPIC
Bilirubin Urine: NEGATIVE
Glucose, UA: NEGATIVE mg/dL
Specific Gravity, Urine: >1.03 — ABNORMAL HIGH (ref 1.005–1.030)
Urobilinogen, UA: 0.2 mg/dL (ref 0.0–1.0)
pH: 5 (ref 5.0–8.0)

## 2013-04-03 LAB — CLOSTRIDIUM DIFFICILE BY PCR: Toxigenic C. Difficile by PCR: NEGATIVE

## 2013-04-03 NOTE — ED Notes (Signed)
Pt alert & oriented x4, stable gait. Patient given discharge instructions, paperwork & prescription(s). Patient instructed to stop at the registration desk to finish any additional paperwork. Patient verbalized understanding. Pt left department w/ no further questions in wheelchair.

## 2013-04-06 NOTE — ED Provider Notes (Signed)
Medical screening examination/treatment/procedure(s) were performed by non-physician practitioner and as supervising physician I was immediately available for consultation/collaboration.  Jasper Riling. [name], MD 04/06/13 1210

## 2013-04-23 ENCOUNTER — Ambulatory Visit: Payer: PRIVATE HEALTH INSURANCE | Admitting: Family Medicine

## 2013-04-24 ENCOUNTER — Ambulatory Visit (INDEPENDENT_AMBULATORY_CARE_PROVIDER_SITE_OTHER): Payer: PRIVATE HEALTH INSURANCE | Admitting: Family Medicine

## 2013-04-24 ENCOUNTER — Encounter: Payer: Self-pay | Admitting: Family Medicine

## 2013-04-24 VITALS — BP 170/96 | Ht 73.0 in | Wt 258.2 lb

## 2013-04-24 DIAGNOSIS — K8020 Calculus of gallbladder without cholecystitis without obstruction: Secondary | ICD-10-CM

## 2013-04-24 DIAGNOSIS — I10 Essential (primary) hypertension: Secondary | ICD-10-CM

## 2013-04-24 DIAGNOSIS — R809 Proteinuria, unspecified: Secondary | ICD-10-CM

## 2013-04-24 DIAGNOSIS — R197 Diarrhea, unspecified: Secondary | ICD-10-CM

## 2013-04-24 DIAGNOSIS — D649 Anemia, unspecified: Secondary | ICD-10-CM

## 2013-04-24 DIAGNOSIS — E119 Type 2 diabetes mellitus without complications: Secondary | ICD-10-CM

## 2013-04-24 MED ORDER — METRONIDAZOLE 500 MG PO TABS: 500.0000 mg | ORAL_TABLET | Freq: Three times a day (TID) | ORAL | Status: AC

## 2013-04-24 MED ORDER — TORSEMIDE 20 MG PO TABS: ORAL_TABLET | ORAL | Status: DC

## 2013-04-24 NOTE — Progress Notes (Signed)
  Subjective:    Patient ID: Eugene [name], male    DOB: 07-24-1956, 57 y.o.   MRN: [id number]  HPI Here today for check up. He has diabetes heart disease cholesterol hypertension. He also has frequent nausea with eating he also states he has significant upper abdominal discomforts when he eats. He denies any rectal bleeding he does have recent iron deficient anemia  Having pain in his left, upper abdomen.  Also left arm felt numb, tingly yesterday. See below. Family history noncontributory social lives with wife tries to eat healthy tries to stay physically active   Review of Systems He denies PND denies chest pressure he did state he had a sharp pain in the upper left chest radiated into the left arm it came in went away with in minutes there is no sweats associated with it. He denies any nausea vomiting diarrhea    Objective:   Physical Exam He has a dense neuropathy in the feet. He has pedal edema all the way up to his knees lungs are clear there are no crackles heart is regular there's no murmurs abdomen is soft pulses are normal blood pressure elevated       Assessment & Plan:  Pedal edema-demadex-we will stop the Lasix use Demadex. 20 mg. Take 2 in the morning 1 at noon. Recheck metabolic 7 level in approximately 2 weeks. Followup patient in approximately 3 weeks. Gastric emptying-I. am concerned about the possibility of gastroparesis he has nausea every time he eats I feel he needs an gastric emptying study await the results. 24 hour urine-because of renal insufficiency we'll do a 24-hour urine for protein and creatinine clearance to look at this issue closer. Await the results of this. Heme cards times 3-he states he has had times where his bowels been overly active he doesn't think there is any blood in it. He cannot be certain. He does have a history of a iron deficient anemia although he is on blood thinners. His last colonoscopy was 3 years ago c diff test/flagyl-because  of the frequent diarrhea we will do C. difficile testing. Mus chest pain not cardiac-I. believe his chest pain is been musculoskeletal I don't find any evidence of any other underlying issue  Also blood pressure on recheck 152/92 I encouraged patient to resume all medicines plus the change in diuretic we stated above I recommend we recheck his blood pressure again in a few weeks time may need further medication or adjustments 25 minutes spent with patient [phone]

## 2013-04-26 ENCOUNTER — Emergency Department (HOSPITAL_COMMUNITY): Payer: PRIVATE HEALTH INSURANCE

## 2013-04-26 ENCOUNTER — Inpatient Hospital Stay (HOSPITAL_COMMUNITY)
Admission: EM | Admit: 2013-04-26 | Discharge: 2013-04-29 | DRG: 304 | Disposition: A | Discharge status: Home / Self Care | Payer: PRIVATE HEALTH INSURANCE | Attending: Internal Medicine | Admitting: Internal Medicine

## 2013-04-26 ENCOUNTER — Encounter (HOSPITAL_COMMUNITY): Payer: Self-pay | Admitting: *Deleted

## 2013-04-26 DIAGNOSIS — G629 Polyneuropathy, unspecified: Secondary | ICD-10-CM

## 2013-04-26 DIAGNOSIS — I10 Essential (primary) hypertension: Principal | ICD-10-CM | POA: Diagnosis present

## 2013-04-26 DIAGNOSIS — Z6833 Body mass index (BMI) 33.0-33.9, adult: Secondary | ICD-10-CM

## 2013-04-26 DIAGNOSIS — I5021 Acute systolic (congestive) heart failure: Secondary | ICD-10-CM

## 2013-04-26 DIAGNOSIS — Z91199 Patient's noncompliance with other medical treatment and regimen due to unspecified reason: Secondary | ICD-10-CM

## 2013-04-26 DIAGNOSIS — Z794 Long term (current) use of insulin: Secondary | ICD-10-CM

## 2013-04-26 DIAGNOSIS — I509 Heart failure, unspecified: Secondary | ICD-10-CM | POA: Diagnosis present

## 2013-04-26 DIAGNOSIS — R609 Edema, unspecified: Secondary | ICD-10-CM

## 2013-04-26 DIAGNOSIS — E785 Hyperlipidemia, unspecified: Secondary | ICD-10-CM

## 2013-04-26 DIAGNOSIS — J4489 Other specified chronic obstructive pulmonary disease: Secondary | ICD-10-CM | POA: Diagnosis present

## 2013-04-26 DIAGNOSIS — A0472 Enterocolitis due to Clostridium difficile, not specified as recurrent: Secondary | ICD-10-CM

## 2013-04-26 DIAGNOSIS — K219 Gastro-esophageal reflux disease without esophagitis: Secondary | ICD-10-CM

## 2013-04-26 DIAGNOSIS — E871 Hypo-osmolality and hyponatremia: Secondary | ICD-10-CM

## 2013-04-26 DIAGNOSIS — E1149 Type 2 diabetes mellitus with other diabetic neurological complication: Secondary | ICD-10-CM | POA: Diagnosis present

## 2013-04-26 DIAGNOSIS — E1142 Type 2 diabetes mellitus with diabetic polyneuropathy: Secondary | ICD-10-CM | POA: Diagnosis present

## 2013-04-26 DIAGNOSIS — Z9119 Patient's noncompliance with other medical treatment and regimen: Secondary | ICD-10-CM

## 2013-04-26 DIAGNOSIS — E669 Obesity, unspecified: Secondary | ICD-10-CM | POA: Diagnosis present

## 2013-04-26 DIAGNOSIS — I252 Old myocardial infarction: Secondary | ICD-10-CM

## 2013-04-26 DIAGNOSIS — I2119 ST elevation (STEMI) myocardial infarction involving other coronary artery of inferior wall: Secondary | ICD-10-CM

## 2013-04-26 DIAGNOSIS — F411 Generalized anxiety disorder: Secondary | ICD-10-CM | POA: Diagnosis present

## 2013-04-26 DIAGNOSIS — Z9861 Coronary angioplasty status: Secondary | ICD-10-CM

## 2013-04-26 DIAGNOSIS — E119 Type 2 diabetes mellitus without complications: Secondary | ICD-10-CM

## 2013-04-26 DIAGNOSIS — J449 Chronic obstructive pulmonary disease, unspecified: Secondary | ICD-10-CM | POA: Diagnosis present

## 2013-04-26 DIAGNOSIS — D649 Anemia, unspecified: Secondary | ICD-10-CM

## 2013-04-26 DIAGNOSIS — I2510 Atherosclerotic heart disease of native coronary artery without angina pectoris: Secondary | ICD-10-CM | POA: Diagnosis present

## 2013-04-26 DIAGNOSIS — I5033 Acute on chronic diastolic (congestive) heart failure: Secondary | ICD-10-CM | POA: Diagnosis present

## 2013-04-26 DIAGNOSIS — N179 Acute kidney failure, unspecified: Secondary | ICD-10-CM

## 2013-04-26 DIAGNOSIS — R079 Chest pain, unspecified: Secondary | ICD-10-CM | POA: Diagnosis present

## 2013-04-26 DIAGNOSIS — G4733 Obstructive sleep apnea (adult) (pediatric): Secondary | ICD-10-CM | POA: Diagnosis present

## 2013-04-26 DIAGNOSIS — I5031 Acute diastolic (congestive) heart failure: Secondary | ICD-10-CM

## 2013-04-26 DIAGNOSIS — K8020 Calculus of gallbladder without cholecystitis without obstruction: Secondary | ICD-10-CM

## 2013-04-26 NOTE — ED Notes (Signed)
Pt reporting pain in mid to right chest.  Pain for past 12 hours.  Denies nausea or SOB with pain.  Pt did receive 3 SL nitro and 4 baby ASA prior to arrival.  Reporting some relief from pain.  BS on scene was 327 per EMS.

## 2013-04-27 DIAGNOSIS — I5031 Acute diastolic (congestive) heart failure: Secondary | ICD-10-CM

## 2013-04-27 DIAGNOSIS — G4733 Obstructive sleep apnea (adult) (pediatric): Secondary | ICD-10-CM

## 2013-04-27 DIAGNOSIS — I10 Essential (primary) hypertension: Principal | ICD-10-CM

## 2013-04-27 DIAGNOSIS — R609 Edema, unspecified: Secondary | ICD-10-CM

## 2013-04-27 DIAGNOSIS — E785 Hyperlipidemia, unspecified: Secondary | ICD-10-CM

## 2013-04-27 DIAGNOSIS — E119 Type 2 diabetes mellitus without complications: Secondary | ICD-10-CM

## 2013-04-27 DIAGNOSIS — I2510 Atherosclerotic heart disease of native coronary artery without angina pectoris: Secondary | ICD-10-CM

## 2013-04-27 DIAGNOSIS — R079 Chest pain, unspecified: Secondary | ICD-10-CM

## 2013-04-27 DIAGNOSIS — I509 Heart failure, unspecified: Secondary | ICD-10-CM

## 2013-04-27 LAB — COMPREHENSIVE METABOLIC PANEL
ALT: 10 U/L (ref 0–53)
AST: 13 U/L (ref 0–37)
CO2: 26 mEq/L (ref 19–32)
Chloride: 103 mEq/L (ref 96–112)
GFR calc non Af Amer: 72 mL/min — ABNORMAL LOW (ref >90)
Sodium: 137 mEq/L (ref 135–145)
Total Bilirubin: 0.2 mg/dL — ABNORMAL LOW (ref 0.3–1.2)

## 2013-04-27 LAB — BASIC METABOLIC PANEL
BUN: 27 mg/dL — ABNORMAL HIGH (ref 6–23)
CO2: 26 mEq/L (ref 19–32)
Calcium: 9.2 mg/dL (ref 8.4–10.5)
Chloride: 101 mEq/L (ref 96–112)
Creatinine, Ser: 1.23 mg/dL (ref 0.50–1.35)

## 2013-04-27 LAB — CBC
HCT: 29.7 % — ABNORMAL LOW (ref 39.0–52.0)
HCT: 31 % — ABNORMAL LOW (ref 39.0–52.0)
MCH: 28.9 pg (ref 26.0–34.0)
MCV: 85.1 fL (ref 78.0–100.0)
MCV: 85.6 fL (ref 78.0–100.0)
Platelets: 170 10*3/uL (ref 150–400)
RBC: 3.49 MIL/uL — ABNORMAL LOW (ref 4.22–5.81)
RDW: 14.4 % (ref 11.5–15.5)
WBC: 5.4 10*3/uL (ref 4.0–10.5)
WBC: 5.5 10*3/uL (ref 4.0–10.5)

## 2013-04-27 LAB — TROPONIN I
Troponin I: <0.3 ng/mL (ref <0.30)
Troponin I: <0.3 ng/mL (ref <0.30)

## 2013-04-27 LAB — GLUCOSE, CAPILLARY

## 2013-04-27 MED ORDER — ALBUTEROL SULFATE (5 MG/ML) 0.5% IN NEBU: 2.5000 mg | INHALATION_SOLUTION | RESPIRATORY_TRACT | Status: DC | PRN

## 2013-04-27 MED ORDER — PRASUGREL HCL 10 MG PO TABS
10.0000 mg | ORAL_TABLET | Freq: Every day | ORAL | Status: DC
  Administered 2013-04-27 – 2013-04-29 (×3): 10 mg via ORAL
  Filled 2013-04-27 (×3): qty 1

## 2013-04-27 MED ORDER — DOXAZOSIN MESYLATE 2 MG PO TABS
4.0000 mg | ORAL_TABLET | Freq: Every day | ORAL | Status: DC
Start: 2013-04-27 — End: 2013-04-29
  Administered 2013-04-27 – 2013-04-28 (×2): 4 mg via ORAL
  Filled 2013-04-27 (×2): qty 2

## 2013-04-27 MED ORDER — ONDANSETRON HCL 4 MG/2ML IJ SOLN
4.0000 mg | Freq: Once | INTRAMUSCULAR | Status: AC
  Administered 2013-04-27: 4 mg via INTRAVENOUS
  Filled 2013-04-27: qty 2

## 2013-04-27 MED ORDER — FUROSEMIDE 10 MG/ML IJ SOLN
20.0000 mg | Freq: Two times a day (BID) | INTRAMUSCULAR | Status: DC
  Administered 2013-04-27: 20 mg via INTRAVENOUS
  Filled 2013-04-27: qty 2

## 2013-04-27 MED ORDER — ASPIRIN EC 81 MG PO TBEC
81.0000 mg | DELAYED_RELEASE_TABLET | Freq: Every day | ORAL | Status: DC
  Administered 2013-04-27 – 2013-04-28 (×2): 81 mg via ORAL
  Filled 2013-04-27 (×2): qty 1

## 2013-04-27 MED ORDER — ONDANSETRON HCL 4 MG/2ML IJ SOLN: 4.0000 mg | Freq: Three times a day (TID) | INTRAMUSCULAR | Status: AC | PRN

## 2013-04-27 MED ORDER — INSULIN GLARGINE 100 UNIT/ML ~~LOC~~ SOLN
80.0000 [IU] | Freq: Every day | SUBCUTANEOUS | Status: DC
  Administered 2013-04-27 – 2013-04-28 (×2): 80 [IU] via SUBCUTANEOUS
  Filled 2013-04-27 (×2): qty 0.8

## 2013-04-27 MED ORDER — ENOXAPARIN SODIUM 40 MG/0.4ML ~~LOC~~ SOLN
40.0000 mg | SUBCUTANEOUS | Status: DC
  Administered 2013-04-27 – 2013-04-29 (×3): 40 mg via SUBCUTANEOUS
  Filled 2013-04-27 (×3): qty 0.4

## 2013-04-27 MED ORDER — MORPHINE SULFATE 4 MG/ML IJ SOLN
4.0000 mg | Freq: Once | INTRAMUSCULAR | Status: AC
  Administered 2013-04-27: 4 mg via INTRAVENOUS
  Filled 2013-04-27: qty 1

## 2013-04-27 MED ORDER — FUROSEMIDE 10 MG/ML IJ SOLN
40.0000 mg | Freq: Two times a day (BID) | INTRAMUSCULAR | Status: DC
  Administered 2013-04-27 – 2013-04-29 (×4): 40 mg via INTRAVENOUS
  Filled 2013-04-27 (×4): qty 4

## 2013-04-27 MED ORDER — MORPHINE SULFATE 4 MG/ML IJ SOLN
4.0000 mg | INTRAMUSCULAR | Status: DC | PRN
  Administered 2013-04-27 (×3): 4 mg via INTRAVENOUS
  Filled 2013-04-27 (×3): qty 1

## 2013-04-27 MED ORDER — ALUM & MAG HYDROXIDE-SIMETH 200-200-20 MG/5ML PO SUSP: 30.0000 mL | Freq: Four times a day (QID) | ORAL | Status: DC | PRN

## 2013-04-27 MED ORDER — METOPROLOL TARTRATE 25 MG PO TABS
25.0000 mg | ORAL_TABLET | Freq: Two times a day (BID) | ORAL | Status: DC
  Administered 2013-04-27 – 2013-04-29 (×5): 25 mg via ORAL
  Filled 2013-04-27 (×5): qty 1

## 2013-04-27 MED ORDER — ALPRAZOLAM 0.25 MG PO TABS
0.2500 mg | ORAL_TABLET | Freq: Every evening | ORAL | Status: DC | PRN
Start: 2013-04-27 — End: 2013-04-29

## 2013-04-27 MED ORDER — BUDESONIDE-FORMOTEROL FUMARATE 160-4.5 MCG/ACT IN AERO
2.0000 | INHALATION_SPRAY | Freq: Two times a day (BID) | RESPIRATORY_TRACT | Status: DC
  Administered 2013-04-27 – 2013-04-29 (×4): 2 via RESPIRATORY_TRACT
  Filled 2013-04-27 (×2): qty 6

## 2013-04-27 MED ORDER — IRBESARTAN 150 MG PO TABS
150.0000 mg | ORAL_TABLET | Freq: Every day | ORAL | Status: DC
  Administered 2013-04-27 – 2013-04-29 (×3): 150 mg via ORAL
  Filled 2013-04-27 (×3): qty 1

## 2013-04-27 MED ORDER — INSULIN ASPART 100 UNIT/ML ~~LOC~~ SOLN
0.0000 [IU] | Freq: Three times a day (TID) | SUBCUTANEOUS | Status: DC
  Administered 2013-04-27: 2 [IU] via SUBCUTANEOUS
  Administered 2013-04-27 – 2013-04-28 (×3): 3 [IU] via SUBCUTANEOUS
  Administered 2013-04-28 – 2013-04-29 (×2): 2 [IU] via SUBCUTANEOUS

## 2013-04-27 NOTE — H&P (Signed)
PCP:   [name], MD   Chief Complaint:  Chest pain  HPI: 57 year old male with a history of coronary artery disease status post PCI, RCA occlusion status post DES with diffuse non-obstructive disease, diabetes mellitus, hypertension, hyperlipidemia obstructive sleep apnea who came to the ED today with chest pain which started around 1 p.m. yesterday. Patient says the pain was substernal in location, nagging in quality, radiating to the right side and left intermittently. Patient took 2 nitroglycerin at home and 4 baby aspirin. In the ED patient required 8 mg of morphine at this time, the pain has subsided. He denies shortness of breath today, no nausea vomiting or diarrhea. He denies any fever no dysuria urgency frequency of urination. In the ED first set of cardiac enzymes negative, EKGs unchanged from previous. At home patient's blood pressure was elevated with systolic BP more than [id number]. Also patient was taking Lasix 80 mg by mouth daily which was changed to Demadex by his PCP 4 days ago. Recently patient was started on Flagyl for diarrhea, and he took it for 3 days. Patient says that he is has not resolved.  Allergies:   Allergies  Allergen Reactions  . Hydrocodone Nausea And Vomiting  . Lisinopril Cough  . Neurontin [Gabapentin]     Side effects, suicidal thoughts  . Statins Other (See Comments)    Muscle aches      Past Medical History  Diagnosis Date  . Arteriosclerotic cardiovascular disease (ASCVD)     a. 05/2011 Cath/PCI: LM nl, LAD 61m, D1 small, D2 small 40m, LCX large 40p, RCA 50-60p, 99 hazy @ origin of PDA with 70-80 in PDA (2.5x26 Resolute Integrity & 3.0x15 Resolute Integrity DES).;  b. 08/2012 Inflat  STEMI/Cath/PCI: LM minor irregs, LAD 50p, D1 50, LCX nl, OM1 25, RCA 30-40p, 100d (treated with 2.75x22mm Promus Premier DES);  c. 08/2012 Echo: EF 55-60%, basal inferopost HK.  [name] Hyperlipidemia   . Diabetes mellitus     Peripheral neuropathy  . Bell palsy   .  Hypertension   . COPD (chronic obstructive pulmonary disease)   . Sleep apnea   . Gallstones   . Cholelithiasis 07/2012    Asymptomatic; identified incidentally  . PONV (postoperative nausea and vomiting)   . Anxiety   . C. difficile colitis     a. 08/2012  . Myocardial infarct 09/08/12  . Nephrolithiasis   . Contrast dye induced nephropathy     a. 08/2012 post cath/pci  . CHF (congestive heart failure)   . Asthma   . Heart disease     Past Surgical History  Procedure Laterality Date  . Circumcision    . Stents    . Esophagogastroduodenoscopy      in [place] over 20 yrs ago  . Colonoscopy      In [facility], approximately 2011 per patient, was normal. Advised to come back in 10 years.  . Esophagogastroduodenoscopy  06/12/2012    Procedure: ESOPHAGOGASTRODUODENOSCOPY (EGD);  Surgeon: Daneil Dolin, MD;  Location: [facility];  Service: Endoscopy;  Laterality: N/A;  9:45  . Cardiac catheterization      Prior to Admission medications   Medication Sig Start Date End Date Taking? Authorizing Provider  albuterol (PROVENTIL) (2.5 MG/3ML) 0.083% nebulizer solution Take 3 mLs (2.5 mg total) by nebulization every 4 (four) hours as needed for wheezing. 02/11/13   Alfonzo Feller, DO  ALPRAZolam Duanne Moron) 0.5 MG tablet Take 0.25-0.5 mg by mouth at bedtime as needed. Sleep aid  Historical Provider, MD  aspirin EC 81 MG tablet Take 81 mg by mouth at bedtime.     Historical Provider, MD  budesonide-formoterol (SYMBICORT) 160-4.5 MCG/ACT inhaler Inhale 2 puffs into the lungs 2 (two) times daily.    Historical Provider, MD  doxazosin (CARDURA) 4 MG tablet Take 1 tablet (4 mg total) by mouth at bedtime. 08/18/12   Yehuda Savannah, MD  Flaxseed, Linseed, (FLAXSEED OIL) 1000 MG CAPS Take 1,000 mg by mouth 2 (two) times daily.     Historical Provider, MD  insulin aspart (NOVOLOG) 100 UNIT/ML injection Inject 4-6 Units into the skin 2 (two) times daily. 4 units in am and 6 units  at night. 03/31/13   [name], MD  insulin glargine (LANTUS) 100 UNIT/ML injection Inject 0.8 mLs (80 Units total) into the skin at bedtime. 03/31/13 03/31/14  [name], MD  metoprolol tartrate (LOPRESSOR) 25 MG tablet Take 1 tablet (25 mg total) by mouth 2 (two) times daily. 09/16/12   Rogelia Mire, NP  metroNIDAZOLE (FLAGYL) 500 MG tablet Take 1 tablet (500 mg total) by mouth 3 (three) times daily. For 7 days 04/24/13 05/08/13  [name], MD  nitroGLYCERIN (NITROSTAT) 0.4 MG SL tablet Place 1 tablet (0.4 mg total) under the tongue every 5 (five) minutes x 3 doses as needed for chest pain. 09/16/12   Rogelia Mire, NP  prasugrel (EFFIENT) 10 MG TABS Take 1 tablet (10 mg total) by mouth daily. 09/16/12   Rogelia Mire, NP  silver sulfADIAZINE (SILVADENE) 1 % cream Apply to affected area daily 02/18/13 02/18/14  [name], MD  torsemide (DEMADEX) 20 MG tablet 2 qam , 1qnoon 04/24/13   [name], MD  valsartan (DIOVAN) 160 MG tablet Take 1 tablet (160 mg total) by mouth daily. 01/20/13   [name], MD    Social History:  reports that he has never smoked. He does not have any smokeless tobacco history on file. He reports that he does not drink alcohol or use illicit drugs.  Family History  Problem Relation Age of Onset  . Diabetes Mother   . Heart attack Mother   . Stroke Mother   . Diabetes Sister   . Sleep apnea Sister   . Hypertension Brother   . Diabetes Brother   . Colon cancer Neg Hx   . Liver disease Neg Hx   . Diabetes Brother   . Hypertension Brother      All the positives are listed in BOLD  Review of Systems:  HEENT: Headache, blurred vision, runny nose, sore throat Neck: Hypothyroidism, hyperthyroidism,,lymphadenopathy Chest : Shortness of breath, history of COPD, Asthma, OSA Heart : Chest pain, history of coronary arterey disease GI:  Nausea, vomiting, diarrhea, constipation, GERD GU: Dysuria, urgency, frequency of urination,  hematuria Neuro: Stroke, seizures, syncope Psych: Depression, anxiety, hallucinations   Physical Exam: Blood pressure 157/76, pulse 68, temperature 98.5 F (36.9 C), temperature source Oral, resp. rate 18, height 6\' 1"  (1.854 m), weight 117.028 kg (258 lb), SpO2 98.00%. Constitutional:   Patient is a well-developed and well-nourished male* in no acute distress and cooperative with exam. Head: Normocephalic and atraumatic Mouth: Mucus membranes moist Eyes: PERRL, EOMI, conjunctivae normal Neck: Supple, No Thyromegaly Cardiovascular: RRR, S1 normal, S2 normal Pulmonary/Chest: CTAB, no wheezes, rales, or rhonchi Abdominal: Soft. Non-tender, non-distended, bowel sounds are normal, no masses, organomegaly, or guarding present.  Neurological: A&O x3, Strenght is normal and symmetric bilaterally, cranial nerve II-XII  are grossly intact, no focal motor deficit, sensory intact to light touch bilaterally.  Extremities :  bilateral 2+ pitting edema of lower extremities    Labs on Admission:  Results for orders placed during the hospital encounter of 04/26/13 (from the past 48 hour(s))  CBC     Status: Abnormal   Collection Time    04/26/13 11:30 PM      Result Value Range   WBC 5.5  4.0 - 10.5 K/uL   RBC 3.49 (*) 4.22 - 5.81 MIL/uL   Hemoglobin 10.1 (*) 13.0 - 17.0 g/dL   HCT 29.7 (*) 39.0 - 52.0 %   MCV 85.1  78.0 - 100.0 fL   MCH 28.9  26.0 - 34.0 pg   MCHC 34.0  30.0 - 36.0 g/dL   RDW 14.5  11.5 - 15.5 %   Platelets 170  150 - 400 K/uL  BASIC METABOLIC PANEL     Status: Abnormal   Collection Time    04/26/13 11:30 PM      Result Value Range   Sodium 135  135 - 145 mEq/L   Potassium 4.2  3.5 - 5.1 mEq/L   Chloride 101  96 - 112 mEq/L   CO2 26  19 - 32 mEq/L   Glucose, Bld 266 (*) 70 - 99 mg/dL   BUN 27 (*) 6 - 23 mg/dL   Creatinine, Ser 1.23  0.50 - 1.35 mg/dL   Calcium 9.2  8.4 - 10.5 mg/dL   GFR calc non Af Amer 64 (*) >90 mL/min   GFR calc Af Amer 74 (*) >90 mL/min    Comment: (NOTE)     The eGFR has been calculated using the CKD EPI equation.     This calculation has not been validated in all clinical situations.     eGFR's persistently <90 mL/min signify possible Chronic Kidney     Disease.  TROPONIN I     Status: None   Collection Time    04/26/13 11:30 PM      Result Value Range   Troponin I <0.30  <0.30 ng/mL   Comment:            Due to the release kinetics of cTnI,     a negative result within the first hours     of the onset of symptoms does not rule out     myocardial infarction with certainty.     If myocardial infarction is still suspected,     repeat the test at appropriate intervals.  D-DIMER, QUANTITATIVE     Status: None   Collection Time    04/26/13 11:30 PM      Result Value Range   D-Dimer, Quant 0.33  0.00 - 0.48 ug/mL-FEU   Comment:            AT THE INHOUSE ESTABLISHED CUTOFF     VALUE OF 0.48 ug/mL FEU,     THIS ASSAY HAS BEEN DOCUMENTED     IN THE LITERATURE TO HAVE     A SENSITIVITY AND NEGATIVE     PREDICTIVE VALUE OF AT LEAST     98 TO 99%.  THE TEST RESULT     SHOULD BE CORRELATED WITH     AN ASSESSMENT OF THE CLINICAL     PROBABILITY OF DVT / VTE.    Radiological Exams on Admission: Dg Chest Port 1 View  04/27/2013   *RADIOLOGY REPORT*  Clinical Data: Chest pain.  PORTABLE CHEST - 1 VIEW  Comparison: 04/02/2013.  Findings: Low volume lungs.  Diffuse interstitial prominence is similar to priors when considering low volumes.  Mild cardiomegaly.  Unchanged upper mediastinal contours.  No significant effusion.  No pneumothorax.  Circumscribed lucent abnormality in the right humeral neck, likely degenerative.  IMPRESSION:  No definite active disease when accounting for low volume study.   Original Report Authenticated By: Jorje Guild    Assessment/Plan Active Problems:   DM type 2 (diabetes mellitus, type 2)   Hypertension   Obstructive sleep apnea   CAD (coronary artery disease)   Chest pain  Chest  pain Patient has a history of CAD, facet of cardiac enzymes is negative Will monitor in telemetry and obtain 3 sets of cardiac enzymes  CAD Continue Effient, Lopressor, aspirin EKG shows no change from previous  Diabetes mellitus Continue Lantus 80 units at bedtime Start sliding scale insulin  Hypertension Continue Diovan, Cardura  History of diastolic heart failure We'll obtain BNP, and also start Lasix 20 mg IV every 12 hours as patient seems to be volume overloaded Has 2+ pitting edema of the lower extremities, will benefit from IV Lasix  I will hold the Demadex at this time  OSA We'll continue CPAP at bedtime  DVT prophylaxis Lovenox  Code status: full code  Family discussion:discussed with patient in detail    Time Spent on Admission: 60 min  Statia Burdick S [facility] Pager: [phone] 04/27/2013, 3:50 AM  If 7PM-7AM, please contact night-coverage  [email or web address]  Password [email or web address]

## 2013-04-27 NOTE — Care Management Note (Addendum)
    Page 1 of 1   04/29/2013     10:34:33 AM   CARE MANAGEMENT NOTE 04/29/2013  Patient:  TORRON, FEHRMAN   Account Number:  [id number]  Date Initiated:  04/27/2013  Documentation initiated by:  Theophilus Kinds  Subjective/Objective Assessment:   Pt admitted from home with CHF. Pt lives with his wife and will return home at discharge. Pt is fairly independent with ADL's. Pt stated that he uses a "walking stick" if he is going out doors.     Action/Plan:   Pt given a set of scales to weigh himself daily. CM explained importance of daily weights, compliance with medication, and complaince with diet. Pt verbalized understanding.   Anticipated DC Date:  04/30/2013   Anticipated DC Plan:  [place]  CM consult      Choice offered to / List presented to:             Status of service:  Completed, signed off Medicare Important Message given?   (If response is "NO", the following Medicare IM given date fields will be blank) Date Medicare IM given:   Date Additional Medicare IM given:    Discharge Disposition:  HOME/SELF CARE  Per UR Regulation:    If discussed at Long Length of Stay Meetings, dates discussed:    Comments:  04/29/13 [place], RN BSN CM Pt discharged home today. No CM needs noted.  04/27/13 [place], RN BSN CM

## 2013-04-27 NOTE — Progress Notes (Signed)
UR chart review completed.  

## 2013-04-27 NOTE — Progress Notes (Signed)
[facility] PROGRESS NOTE  Eugene [name] [id number] DOB: [birth year] DOA: 04/26/2013 PCP: [name], MD  HPI: 57 year old male with a history of coronary artery disease status post PCI, RCA occlusion status post DES with diffuse non-obstructive disease, diabetes mellitus, hypertension, hyperlipidemia obstructive sleep apnea who came to the ED today with chest pain which started around 1 p.m. yesterday. Patient says the pain was substernal in location, nagging in quality, radiating to the right side and left intermittently. Patient took 2 nitroglycerin at home and 4 baby aspirin. In the ED patient required 8 mg of morphine at this time, the pain has subsided. He denies shortness of breath today, no nausea vomiting or diarrhea. He denies any fever no dysuria urgency frequency of urination. In the ED first set of cardiac enzymes negative, EKGs unchanged from previous. At home patient's blood pressure was elevated with systolic BP more than A999333. Also patient was taking Lasix 80 mg by mouth daily which was changed to Demadex by his PCP 4 days ago. Recently patient was started on Flagyl for diarrhea, and he took it for 3 days. Patient says that this has resolved.   Assessment/Plan:  Acute on chronic diastolic heart failure with exacerbation - patient is up 10 lbs of fluid, started IV diuresis on admission with good UOP and renal function stable. - continue iv diuresis, monitor BMP in am Chest pain  - Patient has a history of CAD - CE negative x 2, one more pending. Chest pain resolved. CAD  - Continue Effient, Lopressor, aspirin  - EKG shows no change from previous  Diabetes mellitus  - Continue Lantus 80 units at bedtime  - on sliding scale insulin  Hypertension  - Continue Diovan, Cardura  OSA  - We'll continue CPAP at bedtime  Diarrhea - resolved 3 days ago s/p treatment with Metronidazole.  DVT prophylaxis  - Lovenox   Code Status: Full Family Communication: none   Disposition Plan: home when medically ready, continue IV diuresis  Consultants:  none  Procedures:  none  Anti-infectives   None     Antibiotics Given (last 72 hours)   None      HPI/Subjective: - feels better this morning  Objective: Filed Vitals:   04/27/13 0300 04/27/13 0512 04/27/13 0520 04/27/13 0615  BP: 157/76   173/95  Pulse: 68  68 72  Temp:    97.5 F (36.4 C)  TempSrc:    Oral  Resp:   16 16  Height:      Weight:    118.1 kg (260 lb 5.8 oz)  SpO2: 98% 97% 98% 99%    Intake/Output Summary (Last 24 hours) at 04/27/13 0848 Last data filed at 04/27/13 0745  Gross per 24 hour  Intake      0 ml  Output   1225 ml  Net  -1225 ml   Filed Weights   04/26/13 2315 04/27/13 0615  Weight: 117.028 kg (258 lb) 118.1 kg (260 lb 5.8 oz)    Exam:   General:  NAD  Cardiovascular: regular rate and rhythm, without MRG  Respiratory: good air movement, clear to auscultation throughout, no wheezing, ronchi or rales  Abdomen: soft, not tender to palpation, positive bowel sounds  MSK: 2+ pitting peripheral edema  Neuro: CN 2-12 grossly intact, MS 5/5 in all 4  Data Reviewed: Basic Metabolic Panel:  Recent Labs Lab 04/26/13 2330 04/27/13 0524  NA 135 137  K 4.2 4.1  CL 101 103  CO2 26 26  GLUCOSE  266* 161*  BUN 27* 26*  CREATININE 1.23 1.12  CALCIUM 9.2 9.0   Liver Function Tests:  Recent Labs Lab 04/27/13 0524  AST 13  ALT 10  ALKPHOS 82  BILITOT 0.2*  PROT 6.7  ALBUMIN 2.9*   CBC:  Recent Labs Lab 04/26/13 2330 04/27/13 0524  WBC 5.5 5.4  HGB 10.1* 10.4*  HCT 29.7* 31.0*  MCV 85.1 85.6  PLT 170 169   Cardiac Enzymes:  Recent Labs Lab 04/26/13 2330 04/27/13 0524  TROPONINI <0.30 <0.30   BNP (last 3 results)  Recent Labs  11/27/12 1955 02/11/13 1402 04/27/13 0358  PROBNP 357.9* 205.6* 269.6*   CBG:  Recent Labs Lab 04/27/13 0730  GLUCAP 143*   Studies: Dg Chest Port 1 View  04/27/2013   *RADIOLOGY REPORT*   Clinical Data: Chest pain.  PORTABLE CHEST - 1 VIEW  Comparison: 04/02/2013.  Findings: Low volume lungs.  Diffuse interstitial prominence is similar to priors when considering low volumes.  Mild cardiomegaly.  Unchanged upper mediastinal contours.  No significant effusion.  No pneumothorax.  Circumscribed lucent abnormality in the right humeral neck, likely degenerative.  IMPRESSION:  No definite active disease when accounting for low volume study.   Original Report Authenticated By: Jorje Guild    Scheduled Meds: . aspirin EC  81 mg Oral QHS  . budesonide-formoterol  2 puff Inhalation BID  . doxazosin  4 mg Oral QHS  . enoxaparin (LOVENOX) injection  40 mg Subcutaneous Q24H  . furosemide  40 mg Intravenous Q12H  . insulin aspart  0-15 Units Subcutaneous TID WC  . insulin glargine  80 Units Subcutaneous QHS  . irbesartan  150 mg Oral Daily  . metoprolol tartrate  25 mg Oral BID  . prasugrel  10 mg Oral Daily   Continuous Infusions:   Active Problems:   DM type 2 (diabetes mellitus, type 2)   Hypertension   Obstructive sleep apnea   CAD (coronary artery disease)   Chest pain  Time spent: [place], MD [facility] Pager [phone]. If 7 PM - 7 AM, please contact night-coverage at [email or web address], password [facility] 04/27/2013, 8:48 AM  LOS: 1 day

## 2013-04-27 NOTE — ED Provider Notes (Signed)
CSN: [id number]     Arrival date & time 04/26/13  2312 History   First MD Initiated Contact with Patient 04/27/13 0046     Chief Complaint  Patient presents with  . Chest Pain   (Consider location/radiation/quality/duration/timing/severity/associated sxs/prior Treatment) Patient is a 57 y.o. male presenting with chest pain. The history is provided by the patient.  Chest Pain He had onset at about 9 PM of severe sharp, nagging our mid chest pain without radiation. There is no associated dyspnea, nausea, and diaphoresis. He took aspirin and 2 nitroglycerin at home we'll and received an additional nitroglycerin by ambulance with partial relief of pain. Pain was 10/10 at its worst, and is down to 6/10. He has a history of coronary artery disease with myocardial infarction but states that this pain is distinctly different from what he had with his MI. Nothing makes his pain worse. It is not affected by body position, deep breathing, walking. Nothing makes it better except for the partial relief with nitroglycerin.  Past Medical History  Diagnosis Date  . Arteriosclerotic cardiovascular disease (ASCVD)     a. 05/2011 Cath/PCI: LM nl, LAD 82m, D1 small, D2 small 81m, LCX large 40p, RCA 50-60p, 99 hazy @ origin of PDA with 70-80 in PDA (2.5x26 Resolute Integrity & 3.0x15 Resolute Integrity DES).;  b. 08/2012 Inflat  STEMI/Cath/PCI: LM minor irregs, LAD 50p, D1 50, LCX nl, OM1 25, RCA 30-40p, 100d (treated with 2.75x55mm Promus Premier DES);  c. 08/2012 Echo: EF 55-60%, basal inferopost HK.  [name] Hyperlipidemia   . Diabetes mellitus     Peripheral neuropathy  . Bell palsy   . Hypertension   . COPD (chronic obstructive pulmonary disease)   . Sleep apnea   . Gallstones   . Cholelithiasis 07/2012    Asymptomatic; identified incidentally  . PONV (postoperative nausea and vomiting)   . Anxiety   . C. difficile colitis     a. 08/2012  . Myocardial infarct 09/08/12  . Nephrolithiasis   . Contrast dye  induced nephropathy     a. 08/2012 post cath/pci  . CHF (congestive heart failure)   . Asthma   . Heart disease    Past Surgical History  Procedure Laterality Date  . Circumcision    . Stents    . Esophagogastroduodenoscopy      in [place] over 20 yrs ago  . Colonoscopy      In [facility], approximately 2011 per patient, was normal. Advised to come back in 10 years.  . Esophagogastroduodenoscopy  06/12/2012    Procedure: ESOPHAGOGASTRODUODENOSCOPY (EGD);  Surgeon: Daneil Dolin, MD;  Location: [facility];  Service: Endoscopy;  Laterality: N/A;  9:45  . Cardiac catheterization     Family History  Problem Relation Age of Onset  . Diabetes Mother   . Heart attack Mother   . Stroke Mother   . Diabetes Sister   . Sleep apnea Sister   . Hypertension Brother   . Diabetes Brother   . Colon cancer Neg Hx   . Liver disease Neg Hx   . Diabetes Brother   . Hypertension Brother    History  Substance Use Topics  . Smoking status: Never Smoker   . Smokeless tobacco: Not on file  . Alcohol Use: No     Comment: heavy etoh use 30 years ago    Review of Systems  Cardiovascular: Positive for chest pain.  All other systems reviewed and are negative.    Allergies  Hydrocodone; Lisinopril; Neurontin; and Statins  Home Medications   Current Outpatient Rx  Name  Route  Sig  Dispense  Refill  . albuterol (PROVENTIL) (2.5 MG/3ML) 0.083% nebulizer solution   Nebulization   Take 3 mLs (2.5 mg total) by nebulization every 4 (four) hours as needed for wheezing.   75 mL   0   . ALPRAZolam (XANAX) 0.5 MG tablet   Oral   Take 0.25-0.5 mg by mouth at bedtime as needed. Sleep aid         . aspirin EC 81 MG tablet   Oral   Take 81 mg by mouth at bedtime.          . budesonide-formoterol (SYMBICORT) 160-4.5 MCG/ACT inhaler   Inhalation   Inhale 2 puffs into the lungs 2 (two) times daily.         [name] doxazosin (CARDURA) 4 MG tablet   Oral   Take 1 tablet (4 mg  total) by mouth at bedtime.   30 tablet   11   . Flaxseed, Linseed, (FLAXSEED OIL) 1000 MG CAPS   Oral   Take 1,000 mg by mouth 2 (two) times daily.          . insulin aspart (NOVOLOG) 100 UNIT/ML injection   Subcutaneous   Inject 4-6 Units into the skin 2 (two) times daily. 4 units in am and 6 units at night.   1 vial   2   . insulin glargine (LANTUS) 100 UNIT/ML injection   Subcutaneous   Inject 0.8 mLs (80 Units total) into the skin at bedtime.   10 mL   2   . metoprolol tartrate (LOPRESSOR) 25 MG tablet   Oral   Take 1 tablet (25 mg total) by mouth 2 (two) times daily.   60 tablet   6   . metroNIDAZOLE (FLAGYL) 500 MG tablet   Oral   Take 1 tablet (500 mg total) by mouth 3 (three) times daily. For 7 days   42 tablet   0   . nitroGLYCERIN (NITROSTAT) 0.4 MG SL tablet   Sublingual   Place 1 tablet (0.4 mg total) under the tongue every 5 (five) minutes x 3 doses as needed for chest pain.   25 tablet   3   . prasugrel (EFFIENT) 10 MG TABS   Oral   Take 1 tablet (10 mg total) by mouth daily.   30 tablet   6   . silver sulfADIAZINE (SILVADENE) 1 % cream      Apply to affected area daily   100 g   1   . torsemide (DEMADEX) 20 MG tablet      2 qam , 1qnoon   90 tablet   6     Stop lasix!!   . valsartan (DIOVAN) 160 MG tablet   Oral   Take 1 tablet (160 mg total) by mouth daily.   30 tablet   6    BP 179/78  Pulse 80  Temp(Src) 98.5 F (36.9 C) (Oral)  Resp 18  Ht 6\' 1"  (1.854 m)  Wt 258 lb (117.028 kg)  BMI 34.05 kg/m2  SpO2 100% Physical Exam  Nursing note and vitals reviewed.  57 year old male, resting comfortably and in no acute distress. Vital signs are significant for hypertension with blood pressure 179/78. Oxygen saturation is 100%, which is normal. Head is normocephalic and atraumatic. PERRLA, EOMI. Oropharynx is clear. Neck is nontender and supple without adenopathy or JVD. Back is nontender  and there is no CVA tenderness. Lungs  are clear without rales, wheezes, or rhonchi. Chest is nontender. Heart has regular rate and rhythm without murmur. Abdomen is soft, flat, nontender without masses or hepatosplenomegaly and peristalsis is normoactive. Extremities have 3+ pretibial edema, there is also 1+ presacral edema, full range of motion is present. Skin is warm and dry without rash. Neurologic: Mental status is normal, cranial nerves are intact, there are no motor or sensory deficits.  ED Course  Procedures (including critical care time)  Results for orders placed during the hospital encounter of 04/26/13  CBC      Result Value Range   WBC 5.5  4.0 - 10.5 K/uL   RBC 3.49 (*) 4.22 - 5.81 MIL/uL   Hemoglobin 10.1 (*) 13.0 - 17.0 g/dL   HCT 29.7 (*) 39.0 - 52.0 %   MCV 85.1  78.0 - 100.0 fL   MCH 28.9  26.0 - 34.0 pg   MCHC 34.0  30.0 - 36.0 g/dL   RDW 14.5  11.5 - 15.5 %   Platelets 170  150 - 400 K/uL  BASIC METABOLIC PANEL      Result Value Range   Sodium 135  135 - 145 mEq/L   Potassium 4.2  3.5 - 5.1 mEq/L   Chloride 101  96 - 112 mEq/L   CO2 26  19 - 32 mEq/L   Glucose, Bld 266 (*) 70 - 99 mg/dL   BUN 27 (*) 6 - 23 mg/dL   Creatinine, Ser 1.23  0.50 - 1.35 mg/dL   Calcium 9.2  8.4 - 10.5 mg/dL   GFR calc non Af Amer 64 (*) >90 mL/min   GFR calc Af Amer 74 (*) >90 mL/min  TROPONIN I      Result Value Range   Troponin I <0.30  <0.30 ng/mL  D-DIMER, QUANTITATIVE      Result Value Range   D-Dimer, Quant 0.33  0.00 - 0.48 ug/mL-FEU   Dg Chest Port 1 View  04/27/2013   *RADIOLOGY REPORT*  Clinical Data: Chest pain.  PORTABLE CHEST - 1 VIEW  Comparison: 04/02/2013.  Findings: Low volume lungs.  Diffuse interstitial prominence is similar to priors when considering low volumes.  Mild cardiomegaly.  Unchanged upper mediastinal contours.  No significant effusion.  No pneumothorax.  Circumscribed lucent abnormality in the right humeral neck, likely degenerative.  IMPRESSION:  No definite active disease when  accounting for low volume study.   Original Report Authenticated By: Jorje Guild     Date: 04/27/2013  Rate: 88  Rhythm: normal sinus rhythm  QRS Axis: normal  Intervals: normal  ST/T Wave abnormalities: normal  Conduction Disutrbances:Incomplete right bundle-branch block  Narrative Interpretation:   Old EKG Reviewed: unchanged   MDM   1. Chest pain   2. Anemia    Chest pain which seems unlikely to be cardiac. It is different from his MI and review of old records shows that he did not have any at risk myocardium except in the distribution where he had his stent placed in January. D-dimer will be obtained to rule out pulmonary embolism. Given morphine for pain. I do anticipate admitting him for observation for serial cardiac markers. Of note, he had an abdominal ultrasound done last December which did show gallstones. He is reported to have diabetic neuropathy. Given his diabetes with neuropathy, he should be considered for elective cholecystectomy.  Chest pain was partly relieved with first dose of morphine and completely relieved with the second  dose of morphine. Laboratory workup is unremarkable including normal troponin normal d-dimer. Case is discussed with Dr. Darrick Meigs of [facility] who agrees to admit the patient observation status for serial troponins.  Delora Fuel, MD [phone]

## 2013-04-28 LAB — GLUCOSE, CAPILLARY: Glucose-Capillary: 118 mg/dL — ABNORMAL HIGH (ref 70–99)

## 2013-04-28 LAB — BASIC METABOLIC PANEL
CO2: 27 mEq/L (ref 19–32)
Calcium: 9 mg/dL (ref 8.4–10.5)
Creatinine, Ser: 1.21 mg/dL (ref 0.50–1.35)
Glucose, Bld: 141 mg/dL — ABNORMAL HIGH (ref 70–99)

## 2013-04-28 MED ORDER — ONDANSETRON 4 MG PO TBDP
4.0000 mg | ORAL_TABLET | Freq: Three times a day (TID) | ORAL | Status: DC | PRN
  Filled 2013-04-28: qty 1

## 2013-04-28 NOTE — Progress Notes (Signed)
[facility] PROGRESS NOTE  Eugene [name] [id number] DOB: Jun 06, 1956 DOA: 04/26/2013 PCP: [name], MD  HPI: 57 year old male with a history of coronary artery disease status post PCI, RCA occlusion status post DES with diffuse non-obstructive disease, diabetes mellitus, hypertension, hyperlipidemia obstructive sleep apnea who came to the ED today with chest pain which started around 1 p.m. yesterday. Patient says the pain was substernal in location, nagging in quality, radiating to the right side and left intermittently. Patient took 2 nitroglycerin at home and 4 baby aspirin. In the ED patient required 8 mg of morphine at this time, the pain has subsided. He denies shortness of breath today, no nausea vomiting or diarrhea. He denies any fever no dysuria urgency frequency of urination. In the ED first set of cardiac enzymes negative, EKGs unchanged from previous. At home patient's blood pressure was elevated with systolic BP more than [id number]. Also patient was taking Lasix 80 mg by mouth daily which was changed to Demadex by his PCP 4 days ago. Recently patient was started on Flagyl for diarrhea, and he took it for 3 days. Patient says that this has resolved.   Assessment/Plan:  Acute on chronic diastolic heart failure with exacerbation - patient is up ~10 lbs of fluid, started IV diuresis on admission with good UOP and renal function stable. - continue iv diuresis - net negative 3.4 L this morning, still with pitting edema up to mid shins - continue daily weights, today's value pending.  Chest pain  - Patient has a history of CAD - CE negative x 2, one more pending. Chest pain resolved. - his chest pain was likely due to significant elevation of his blood pressure.  CAD  - Continue Effient, Lopressor, aspirin  - EKG shows no change from previous  Diabetes mellitus  - Continue Lantus 80 units at bedtime  - on sliding scale insulin  Hypertension  - Continue Diovan, Cardura   OSA  - We'll continue CPAP at bedtime  Diarrhea - resolved 3 days ago s/p treatment with Metronidazole.  DVT prophylaxis  - Lovenox   Code Status: Full Family Communication: none  Disposition Plan: home when medically ready, continue IV diuresis  Consultants:  none  Procedures:  none  Anti-infectives   None     Antibiotics Given (last 72 hours)   None     HPI/Subjective: - feels better this morning  Objective: Filed Vitals:   04/27/13 1903 04/27/13 2100 04/28/13 0450 04/28/13 0720  BP:  160/84 103/64   Pulse:  72 70   Temp:  98.2 F (36.8 C) 97.9 F (36.6 C)   TempSrc:  Oral Oral   Resp:  20 18   Height:      Weight:      SpO2: 96% 96% 97% 93%    Intake/Output Summary (Last 24 hours) at 04/28/13 1057 Last data filed at 04/28/13 0915  Gross per 24 hour  Intake    120 ml  Output   2300 ml  Net  -2180 ml   Filed Weights   04/26/13 2315 04/27/13 0615  Weight: 117.028 kg (258 lb) 118.1 kg (260 lb 5.8 oz)    Exam:   General:  NAD  Cardiovascular: regular rate and rhythm, without MRG  Respiratory: good air movement, clear to auscultation throughout, no wheezing, ronchi or rales  Abdomen: soft, not tender to palpation, positive bowel sounds  MSK: 2+ pitting peripheral edema  Neuro: CN 2-12 grossly intact, MS 5/5 in all 4  Data Reviewed:  Basic Metabolic Panel:  Recent Labs Lab 04/26/13 2330 04/27/13 0524 04/28/13 0452  NA 135 137 134*  K 4.2 4.1 4.4  CL 101 103 100  CO2 26 26 27   GLUCOSE 266* 161* 141*  BUN 27* 26* 31*  CREATININE 1.23 1.12 1.21  CALCIUM 9.2 9.0 9.0   Liver Function Tests:  Recent Labs Lab 04/27/13 0524  AST 13  ALT 10  ALKPHOS 82  BILITOT 0.2*  PROT 6.7  ALBUMIN 2.9*   CBC:  Recent Labs Lab 04/26/13 2330 04/27/13 0524  WBC 5.5 5.4  HGB 10.1* 10.4*  HCT 29.7* 31.0*  MCV 85.1 85.6  PLT 170 169   Cardiac Enzymes:  Recent Labs Lab 04/26/13 2330 04/27/13 0524 04/27/13 1058 04/27/13 1721   TROPONINI <0.30 <0.30 <0.30 <0.30   BNP (last 3 results)  Recent Labs  11/27/12 1955 02/11/13 1402 04/27/13 0358  PROBNP 357.9* 205.6* 269.6*   CBG:  Recent Labs Lab 04/27/13 0730 04/27/13 1146 04/27/13 1711 04/27/13 2112 04/28/13 0722  GLUCAP 143* 173* 198* 161* 118*   Studies: Dg Chest Port 1 View  04/27/2013   *RADIOLOGY REPORT*  Clinical Data: Chest pain.  PORTABLE CHEST - 1 VIEW  Comparison: 04/02/2013.  Findings: Low volume lungs.  Diffuse interstitial prominence is similar to priors when considering low volumes.  Mild cardiomegaly.  Unchanged upper mediastinal contours.  No significant effusion.  No pneumothorax.  Circumscribed lucent abnormality in the right humeral neck, likely degenerative.  IMPRESSION:  No definite active disease when accounting for low volume study.   Original Report Authenticated By: Jorje Guild    Scheduled Meds: . aspirin EC  81 mg Oral QHS  . budesonide-formoterol  2 puff Inhalation BID  . doxazosin  4 mg Oral QHS  . enoxaparin (LOVENOX) injection  40 mg Subcutaneous Q24H  . furosemide  40 mg Intravenous Q12H  . insulin aspart  0-15 Units Subcutaneous TID WC  . insulin glargine  80 Units Subcutaneous QHS  . irbesartan  150 mg Oral Daily  . metoprolol tartrate  25 mg Oral BID  . prasugrel  10 mg Oral Daily   Continuous Infusions:   Active Problems:   DM type 2 (diabetes mellitus, type 2)   Hypertension   Obstructive sleep apnea   CAD (coronary artery disease)   Chest pain  Time spent: [place], MD [facility] Pager [phone]. If 7 PM - 7 AM, please contact night-coverage at [email or web address], password [email or web address] 04/28/2013, 10:57 AM  LOS: 2 days

## 2013-04-28 NOTE — Progress Notes (Signed)
Pt refused CPAP. Informed pt of the risks without it. Pt stated "not tonight". Will continue to monitor. Pt stable and safe.

## 2013-04-29 DIAGNOSIS — I5021 Acute systolic (congestive) heart failure: Secondary | ICD-10-CM

## 2013-04-29 LAB — BASIC METABOLIC PANEL
CO2: 28 mEq/L (ref 19–32)
Calcium: 9 mg/dL (ref 8.4–10.5)
Creatinine, Ser: 1.07 mg/dL (ref 0.50–1.35)
Glucose, Bld: 139 mg/dL — ABNORMAL HIGH (ref 70–99)
Sodium: 138 mEq/L (ref 135–145)

## 2013-04-29 MED ORDER — TORSEMIDE 20 MG PO TABS: ORAL_TABLET | ORAL | Status: DC

## 2013-04-29 NOTE — Discharge Summary (Signed)
Physician Discharge Summary  Eugene [name] [id number] DOB: 1956-08-18 DOA: 04/26/2013  PCP: Eugene Lange, MD  Admit date: 04/26/2013 Discharge date: 04/29/2013  Time spent: Greater than 30 minutes  Recommendations for Outpatient Follow-up:  1. Followup with cardiology in one week. 2. Followup primary care physician as previously scheduled.  Discharge Diagnoses:  1. Chest pain, no evidence of myocardial infarction or ischemia. Resolved. History of coronary artery disease. 2. Acute on chronic diastolic congestive heart failure, achieved very good diuresis and some weight loss. 3. Hypertension. 4. Type 2 diabetes mellitus. 5. Obstructive sleep apnea. 6. Obesity.   Discharge Condition: Stable.  Diet recommendation: Carbohydrate modified diet.  Filed Weights   04/26/13 2315 04/27/13 0615 04/29/13 0500  Weight: 117.028 kg (258 lb) 118.1 kg (260 lb 5.8 oz) 116.801 kg (257 lb 8 oz)    History of present illness:  This very pleasant 57 year old man, who is known to have chronic diastolic congestive heart failure, having suffered [name] myocardial infarction apparently in January of this year, presented with chest pain. Please see initial history as outlined below: HPI:  57 year old male with [name] history of coronary artery disease status post PCI, RCA occlusion status post DES with diffuse non-obstructive disease, diabetes mellitus, hypertension, hyperlipidemia obstructive sleep apnea who came to the ED today with chest pain which started around 1 p.m. yesterday. Patient says the pain was substernal in location, nagging in quality, radiating to the right side and left intermittently. Patient took 2 nitroglycerin at home and 4 baby aspirin. In the ED patient required 8 mg of morphine at this time, the pain has subsided. He denies shortness of breath today, no nausea vomiting or diarrhea. He denies any fever no dysuria urgency frequency of urination. In the ED first set of cardiac enzymes  negative, EKGs unchanged from previous. At home patient's blood pressure was elevated with systolic BP more than A999333. Also patient was taking Lasix 80 mg by mouth daily which was changed to Demadex by his PCP 4 days ago. Recently patient was started on Flagyl for diarrhea, and he took it for 3 days. Patient says that he is has not resolved.  Hospital Course:  The patient was admitted and was felt to be in congestive heart failure. He was therefore started on intravenous Lasix and this achieved [name] very good diuresis. He has lost over 2 kg in the last couple of days. He feels much improved. Serum cardiac enzymes are negative. I think is probably not back to his dry weight at the present time but this can be managed as an outpatient. He is not dyspneic at rest and is not requiring any supplemental oxygen to maintain saturations above 90%. I've asked him to increase his Demadex to 40 mg twice [name] day and we will arrange an earlier appointment with his cardiologist. He is otherwise stable to be discharged.  Procedures:  None.   Consultations:  None.  Discharge Exam: Filed Vitals:   04/29/13 0500  BP: 122/73  Pulse: 67  Temp: 97.8 F (36.6 C)  Resp: 20    General: He looks systemically well. He is obese. Cardiovascular: Heart sounds are present without gallop rhythm. There are no murmurs. Jugular venous pressure not raised. There is peripheral pitting edema or in his legs, lower, up to midshin. Respiratory: Lung fields are entirely clear. There are no crackles or wheezes. He is alert and orientated.  Discharge Instructions  Discharge Orders   Future Appointments Provider Department Dept Phone   05/21/2013 4:00 PM Eugene Reaper  Trude Mcburney, MD [place] [phone]   Future Orders Complete By Expires   Diet - low sodium heart healthy  As directed    Increase activity slowly  As directed        Medication List         albuterol (2.5 MG/3ML) 0.083% nebulizer solution  Commonly  known as:  PROVENTIL  Take 3 mLs (2.5 mg total) by nebulization every 4 (four) hours as needed for wheezing.     ALPRAZolam 0.5 MG tablet  Commonly known as:  XANAX  Take 0.25-0.5 mg by mouth at bedtime as needed. Sleep aid     aspirin EC 81 MG tablet  Take 81 mg by mouth at bedtime.     budesonide-formoterol 160-4.5 MCG/ACT inhaler  Commonly known as:  SYMBICORT  Inhale 2 puffs into the lungs 2 (two) times daily.     doxazosin 4 MG tablet  Commonly known as:  CARDURA  Take 1 tablet (4 mg total) by mouth at bedtime.     Flaxseed Oil 1000 MG Caps  Take 1,000 mg by mouth 2 (two) times daily.     insulin aspart 100 UNIT/ML injection  Commonly known as:  novoLOG  Inject 4-6 Units into the skin 2 (two) times daily. 4 units in am and 6 units at night.     insulin glargine 100 UNIT/ML injection  Commonly known as:  LANTUS  Inject 0.8 mLs (80 Units total) into the skin at bedtime.     metoprolol tartrate 25 MG tablet  Commonly known as:  LOPRESSOR  Take 1 tablet (25 mg total) by mouth 2 (two) times daily.     metroNIDAZOLE 500 MG tablet  Commonly known as:  FLAGYL  Take 1 tablet (500 mg total) by mouth 3 (three) times daily. For 7 days     nitroGLYCERIN 0.4 MG SL tablet  Commonly known as:  NITROSTAT  Place 1 tablet (0.4 mg total) under the tongue every 5 (five) minutes x 3 doses as needed for chest pain.     prasugrel 10 MG Tabs tablet  Commonly known as:  EFFIENT  Take 1 tablet (10 mg total) by mouth daily.     silver sulfADIAZINE 1 % cream  Commonly known as:  SILVADENE  Apply to affected area daily     torsemide 20 MG tablet  Commonly known as:  DEMADEX  Take 40 mg in the morning and 40 mg at [facility].     valsartan 160 MG tablet  Commonly known as:  DIOVAN  Take 1 tablet (160 mg total) by mouth daily.       Allergies  Allergen Reactions  . Hydrocodone Nausea And Vomiting  . Lisinopril Cough  . Neurontin [Gabapentin]     Side effects, suicidal thoughts  .  Statins Other (See Comments)    Muscle aches       Follow-up Information   Follow up with Eugene Sable A, MD. Schedule an appointment as soon as possible for [name] visit in 1 week.   Specialty:  Cardiology   Contact information:   [address] [place] [address] [phone]        The results of significant diagnostics from this hospitalization (including imaging, microbiology, ancillary and laboratory) are listed below for reference.    Significant Diagnostic Studies: Dg Chest Port 1 View  04/27/2013   *RADIOLOGY REPORT*  Clinical Data: Chest pain.  PORTABLE CHEST - 1 VIEW  Comparison: 04/02/2013.  Findings: Low volume  lungs.  Diffuse interstitial prominence is similar to priors when considering low volumes.  Mild cardiomegaly.  Unchanged upper mediastinal contours.  No significant effusion.  No pneumothorax.  Circumscribed lucent abnormality in the right humeral neck, likely degenerative.  IMPRESSION:  No definite active disease when accounting for low volume study.   Original Report Authenticated By: Jorje Guild   Dg Abd Acute W/chest  04/02/2013   *RADIOLOGY REPORT*  Clinical Data: Diarrhea for 2 days.  ACUTE ABDOMEN SERIES (ABDOMEN 2 VIEW & CHEST 1 VIEW)  Comparison: CT abdomen and pelvis 06/01/2012 and chest and two views abdomen and 06/13.  Findings: Single view of the chest demonstrates clear lungs and normal heart size.  No pneumothorax or pleural fluid.  Two views of the abdomen show no free intraperitoneal air.  The bowel gas pattern is normal.  IMPRESSION: Negative exam.   Original Report Authenticated By: Orlean Patten, M.D.        Labs: Basic Metabolic Panel:  Recent Labs Lab 04/26/13 2330 04/27/13 0524 04/28/13 0452 04/29/13 0458  NA 135 137 134* 138  K 4.2 4.1 4.4 4.1  CL 101 103 100 100  CO2 26 26 27 28   GLUCOSE 266* 161* 141* 139*  BUN 27* 26* 31* 31*  CREATININE 1.23 1.12 1.21 1.07  CALCIUM 9.2 9.0 9.0 9.0   Liver Function  Tests:  Recent Labs Lab 04/27/13 0524  AST 13  ALT 10  ALKPHOS 82  BILITOT 0.2*  PROT 6.7  ALBUMIN 2.9*     CBC:  Recent Labs Lab 04/26/13 2330 04/27/13 0524  WBC 5.5 5.4  HGB 10.1* 10.4*  HCT 29.7* 31.0*  MCV 85.1 85.6  PLT 170 169   Cardiac Enzymes:  Recent Labs Lab 04/26/13 2330 04/27/13 0524 04/27/13 1058 04/27/13 1721  TROPONINI <0.30 <0.30 <0.30 <0.30   BNP: BNP (last 3 results)  Recent Labs  11/27/12 1955 02/11/13 1402 04/27/13 0358  PROBNP 357.9* 205.6* 269.6*   CBG:  Recent Labs Lab 04/28/13 0722 04/28/13 1135 04/28/13 1656 04/28/13 2103 04/29/13 0728  GLUCAP 118* 130* 189* 185* 122*       Signed:  [place]  [facility] 04/29/2013, 9:15 AM

## 2013-04-29 NOTE — Progress Notes (Signed)
IV removed, site WNL.  Pt given d/c instructions and new prescriptions.  Discussed home care with patient and discussed home medications and CHF d/c education, packet given, patient verbalizes understanding, teachback completed. F/U appointment in place with family MD and cardiologist, pt states they will keep appointments. Pt given scale to use after discharge at home.  Pt is stable at this time. Pt taken to main entrance in wheelchair by staff member.

## 2013-04-30 NOTE — Progress Notes (Signed)
UR chart review completed.  

## 2013-05-04 ENCOUNTER — Ambulatory Visit (INDEPENDENT_AMBULATORY_CARE_PROVIDER_SITE_OTHER): Payer: PRIVATE HEALTH INSURANCE | Admitting: Adult Health

## 2013-05-04 ENCOUNTER — Encounter: Payer: Self-pay | Admitting: Adult Health

## 2013-05-04 VITALS — BP 199/103 | HR 91 | Ht 73.0 in | Wt 263.0 lb

## 2013-05-04 DIAGNOSIS — I5021 Acute systolic (congestive) heart failure: Secondary | ICD-10-CM

## 2013-05-04 DIAGNOSIS — I509 Heart failure, unspecified: Secondary | ICD-10-CM

## 2013-05-04 DIAGNOSIS — I5031 Acute diastolic (congestive) heart failure: Secondary | ICD-10-CM

## 2013-05-04 DIAGNOSIS — I2510 Atherosclerotic heart disease of native coronary artery without angina pectoris: Secondary | ICD-10-CM

## 2013-05-04 DIAGNOSIS — I7090 Unspecified atherosclerosis: Secondary | ICD-10-CM

## 2013-05-04 DIAGNOSIS — I10 Essential (primary) hypertension: Secondary | ICD-10-CM

## 2013-05-04 NOTE — Patient Instructions (Addendum)
Your physician recommends that you schedule a follow-up appointment in: 1 month  Your physician has recommended you make the following change in your medication:  1. Take 3 tablets of Demadex on empty stomach this afternoon. Take 3 tablets in the morning on empty stomach, then back to regular dosage.  Your physician has requested that you regularly monitor and record your blood pressure readings at home. Please use the same machine at the same time of day to check your readings and record them to bring to your follow-up visit.  Please call office on Wednesday if you lose 5 lbs.  Your physician recommends that you return for lab work on Wednesday. BMET

## 2013-05-04 NOTE — Assessment & Plan Note (Signed)
He is without complaints of chest pain. I have given him samples of Effient to assist with cost issues.

## 2013-05-04 NOTE — Assessment & Plan Note (Signed)
Blood pressure is elevated on this visit. He admits to eating salty foods, especially at breakfast, with ham and bacon. He is taking his medications as directed. I will not increase diovan at this time as some of hypertension is related to CHF decompensation. He is to increase demedex for two days.

## 2013-05-04 NOTE — Progress Notes (Signed)
Name: Eugene [name]    DOB: 12-15-1955  Age: 57 y.o.  MR#: [id number]       PCP:  [name], MD      Insurance: Payor: MEDCOST / Plan: MEDCOST / Product Type: *No Product type* /   CC:    Chief Complaint  Patient presents with  . Coronary Artery Disease  . Atrial Fibrillation  . Congestive Heart Failure    VS Filed Vitals:   05/04/13 1452  BP: 199/103  Pulse: 91  Height: 6\' 1"  (1.854 m)  Weight: 263 lb (119.296 kg)    Weights Current Weight  05/04/13 263 lb (119.296 kg)  04/29/13 257 lb 8 oz (116.801 kg)  04/24/13 258 lb 3.2 oz (117.119 kg)    Blood Pressure  BP Readings from Last 3 Encounters:  05/04/13 199/103  04/29/13 122/73  04/24/13 170/96     Admit date:  (Not on file) Last encounter with RMR:  12/18/2012   Allergy Hydrocodone; Lisinopril; Neurontin; and Statins  Current Outpatient Prescriptions  Medication Sig Dispense Refill  . albuterol (PROVENTIL) (2.5 MG/3ML) 0.083% nebulizer solution Take 3 mLs (2.5 mg total) by nebulization every 4 (four) hours as needed for wheezing.  75 mL  0  . ALPRAZolam (XANAX) 0.5 MG tablet Take 0.25-0.5 mg by mouth at bedtime as needed. Sleep aid      . aspirin EC 81 MG tablet Take 81 mg by mouth at bedtime.       . budesonide-formoterol (SYMBICORT) 160-4.5 MCG/ACT inhaler Inhale 2 puffs into the lungs 2 (two) times daily.      [name] doxazosin (CARDURA) 4 MG tablet Take 1 tablet (4 mg total) by mouth at bedtime.  30 tablet  11  . Flaxseed, Linseed, (FLAXSEED OIL) 1000 MG CAPS Take 1,000 mg by mouth 2 (two) times daily.       . insulin aspart (NOVOLOG) 100 UNIT/ML injection Inject 4-6 Units into the skin 2 (two) times daily. 4 units in am and 6 units at night.  1 vial  2  . insulin glargine (LANTUS) 100 UNIT/ML injection Inject 0.8 mLs (80 Units total) into the skin at bedtime.  10 mL  2  . metoprolol tartrate (LOPRESSOR) 25 MG tablet Take 1 tablet (25 mg total) by mouth 2 (two) times daily.  60 tablet  6  . metroNIDAZOLE  (FLAGYL) 500 MG tablet Take 1 tablet (500 mg total) by mouth 3 (three) times daily. For 7 days  42 tablet  0  . nitroGLYCERIN (NITROSTAT) 0.4 MG SL tablet Place 1 tablet (0.4 mg total) under the tongue every 5 (five) minutes x 3 doses as needed for chest pain.  25 tablet  3  . pantoprazole (PROTONIX) 40 MG tablet Take 40 mg by mouth 2 (two) times daily.      . prasugrel (EFFIENT) 10 MG TABS Take 1 tablet (10 mg total) by mouth daily.  30 tablet  6  . silver sulfADIAZINE (SILVADENE) 1 % cream Apply to affected area daily  100 g  1  . torsemide (DEMADEX) 20 MG tablet Take 40 mg in the morning and 40 mg at [facility]      . valsartan (DIOVAN) 160 MG tablet Take 1 tablet (160 mg total) by mouth daily.  30 tablet  6   No current facility-administered medications for this visit.    Discontinued Meds:   There are no discontinued medications.  Patient Active Problem List   Diagnosis Date Noted  . Chest pain 04/27/2013  .  CAD (coronary artery disease) 09/16/2012  . Acute diastolic CHF (congestive heart failure) 09/16/2012  . ST elevation myocardial infarction (STEMI) of inferolateral wall, initial episode of care 09/14/2012  . ARF (acute renal failure) 09/14/2012  . Acute systolic CHF (congestive heart failure), NYHA class 3 09/14/2012  . C. difficile colitis 09/12/2012  . Edema 08/18/2012  . Anemia, normocytic normochromic 07/31/2012  . Cholelithiasis 07/27/2012  . Gastroesophageal reflux disease 05/10/2012  . Noncompliance 05/09/2012  . Peripheral neuropathy 10/24/2011  . Obstructive sleep apnea 08/23/2011  . Hyponatremia 08/23/2011  . Arteriosclerotic cardiovascular disease (ASCVD)   . Hyperlipidemia   . Cholelithiasis 05/28/2011  . DM type 2 (diabetes mellitus, type 2) 05/25/2011  . Hypertension 05/25/2011    LABS    Component Value Date/Time   NA 138 04/29/2013 0458   NA 134* 04/28/2013 0452   NA 137 04/27/2013 0524   K 4.1 04/29/2013 0458   K 4.4 04/28/2013 0452   K 4.1 04/27/2013 0524    CL 100 04/29/2013 0458   CL 100 04/28/2013 0452   CL 103 04/27/2013 0524   CO2 28 04/29/2013 0458   CO2 27 04/28/2013 0452   CO2 26 04/27/2013 0524   GLUCOSE 139* 04/29/2013 0458   GLUCOSE 141* 04/28/2013 0452   GLUCOSE 161* 04/27/2013 0524   BUN 31* 04/29/2013 0458   BUN 31* 04/28/2013 0452   BUN 26* 04/27/2013 0524   CREATININE 1.07 04/29/2013 0458   CREATININE 1.21 04/28/2013 0452   CREATININE 1.12 04/27/2013 0524   CREATININE 1.11 12/01/2012 1320   CREATININE 0.93 11/07/2012 1250   CREATININE 1.15 09/29/2012 1553   CALCIUM 9.0 04/29/2013 0458   CALCIUM 9.0 04/28/2013 0452   CALCIUM 9.0 04/27/2013 0524   GFRNONAA 76* 04/29/2013 0458   GFRNONAA 65* 04/28/2013 0452   GFRNONAA 72* 04/27/2013 0524   GFRAA 88* 04/29/2013 0458   GFRAA 76* 04/28/2013 0452   GFRAA 83* 04/27/2013 0524   CMP     Component Value Date/Time   NA 138 04/29/2013 0458   K 4.1 04/29/2013 0458   CL 100 04/29/2013 0458   CO2 28 04/29/2013 0458   GLUCOSE 139* 04/29/2013 0458   BUN 31* 04/29/2013 0458   CREATININE 1.07 04/29/2013 0458   CREATININE 1.11 12/01/2012 1320   CALCIUM 9.0 04/29/2013 0458   PROT 6.7 04/27/2013 0524   ALBUMIN 2.9* 04/27/2013 0524   AST 13 04/27/2013 0524   ALT 10 04/27/2013 0524   ALKPHOS 82 04/27/2013 0524   BILITOT 0.2* 04/27/2013 0524   GFRNONAA 76* 04/29/2013 0458   GFRAA 88* 04/29/2013 0458       Component Value Date/Time   WBC 5.4 04/27/2013 0524   WBC 5.5 04/26/2013 2330   WBC 7.0 04/02/2013 2239   HGB 10.4* 04/27/2013 0524   HGB 10.1* 04/26/2013 2330   HGB 10.2* 04/02/2013 2239   HCT 31.0* 04/27/2013 0524   HCT 29.7* 04/26/2013 2330   HCT 30.1* 04/02/2013 2239   MCV 85.6 04/27/2013 0524   MCV 85.1 04/26/2013 2330   MCV 86.0 04/02/2013 2239    Lipid Panel     Component Value Date/Time   CHOL 229* 09/09/2012 0319   TRIG 381* 09/09/2012 0319   HDL 36* 09/09/2012 0319   CHOLHDL 6.4 09/09/2012 0319   VLDL 76* 09/09/2012 0319   LDLCALC 117* 09/09/2012 0319    ABG    Component Value Date/Time   PHART 7.381 06/01/2011 0808   PCO2ART 44.3 06/01/2011 0808    PO2ART 56.0* 06/01/2011  0808   HCO3 26.3* 06/01/2011 0808   TCO2 28 06/01/2011 0808   ACIDBASEDEF 1.0 06/01/2011 0807   O2SAT 88.0 06/01/2011 0808     Lab Results  Component Value Date   TSH 1.753 08/18/2012   BNP (last 3 results)  Recent Labs  11/27/12 1955 02/11/13 1402 04/27/13 0358  PROBNP 357.9* 205.6* 269.6*   Cardiac Panel (last 3 results) No results found for this basename: CKTOTAL, CKMB, TROPONINI, RELINDX,  in the last 72 hours  Iron/TIBC/Ferritin    Component Value Date/Time   IRON 33* 03/17/2013 1405   TIBC 184* 03/17/2013 1405   FERRITIN 299 03/17/2013 1405     EKG Orders placed in visit on 05/04/13  . EKG 12-LEAD     Prior Assessment and Plan Problem List as of 05/04/2013     Cardiovascular and Mediastinum   Hypertension   Last Assessment & Plan   12/18/2012 Office Visit Written 12/18/2012  1:46 PM by Lendon Colonel, NP     Blood pressure is mildly elevated on this visit, but he was in a hurry to get here. He normally runs a blood pressure typically between 120 and 135. In cardiac rehabilitation he was found to have blood pressures as low as 110. We will continue him on current medication regimen. If blood pressure remains elevated or trending upward at cardiac rehabilitation we may need to adjust medications. At this time watch and wait.    Arteriosclerotic cardiovascular disease (ASCVD)   Last Assessment & Plan   12/18/2012 Office Visit Written 12/18/2012  1:45 PM by Lendon Colonel, NP     Symptoms of coughing and dyspnea have resolved with increased dose of Lasix to 40 mg one half tablets in the morning and 40 mg at nighttime. He has lost 8 pounds. We will decrease his Lasix to 40 mg twice a day now as his creatinine rose to 1.10 from normal of 0.86. I reviewed his labs with him and he verbalizes understanding. We will see him again in 6 months unless he becomes symptomatic.    ST elevation myocardial infarction (STEMI) of inferolateral wall, initial  episode of care   Acute systolic CHF (congestive heart failure), NYHA class 3   Last Assessment & Plan   11/28/2012 Office Visit Written 11/28/2012 12:31 PM by Lendon Colonel, NP     There is evidence of mild fluid overload and mildly elevated BNP of 353 in the ER yesterday we will increase his Lasix to 60 mg in the morning and 40 mg in the evening. We have asked him to limit his fluid intake if he is drinking a lot of fluids even though he is taking IV diuretics. We will followup on Monday with a BMET and a BNP and see him in the office again on Tuesday. Lung discussion with the patient with Dr. Harrington Challenger concerning his symptoms. If he has recurrent shortness of breath or a nonweight of symptoms with increased Lasix he is present in the emergency room over the weekend.    CAD (coronary artery disease)   Last Assessment & Plan   11/28/2012 Office Visit Written 11/28/2012 12:30 PM by Lendon Colonel, NP     This assessment, I do not feel that this is his anginal equivalent. He is coughing with bilateral wheezing and has some lower extremity edema which leads me to believe that he has some fluid overload causing some of his symptoms. I asked Dr. Wyatt Haste who is on site to review  the case and see the patient with me. She is in agreement that she does not believe that this is his anginal equivalent. Not send for cath at this time.    Acute diastolic CHF (congestive heart failure)   Last Assessment & Plan   12/18/2012 Office Visit Written 12/18/2012  1:46 PM by Lendon Colonel, NP     Well compensated at this time. Weight is down 8 pounds and he is feeling much better. He has been reminded to adhere to a low-sodium diet.      Respiratory   Obstructive sleep apnea   Last Assessment & Plan   08/23/2011 Office Visit Written 08/23/2011  2:58 PM by Yehuda Savannah, MD     Patient has improved with nocturnal CPAP therapy and supplemental oxygen.  Based upon ABG in 05/2011 with pH of 7.38, pCO2 of 44 and pO2  of 56, he also has an element of hypoventilation at rest and substantial hypoxemia.  The contribution of his weight is uncertain, since his BMI is only approximately 30.  Dr. Luan Pulling is assessing for additional causes of hypoxemia.  He might benefit from continuous oxygen therapy.       Digestive   Cholelithiasis   Gastroesophageal reflux disease   Cholelithiasis   C. difficile colitis     Endocrine   DM type 2 (diabetes mellitus, type 2)   Last Assessment & Plan   08/23/2011 Office Visit Written 08/23/2011  2:52 PM by Yehuda Savannah, MD     Patient is unaware of any recent hemoglobin A1c measurements.  We will seek recent laboratory results from his primary care physician.      Nervous and Auditory   Peripheral neuropathy   Last Assessment & Plan   10/24/2011 Office Visit Written 10/24/2011  2:20 PM by Yehuda Savannah, MD     Risk of repetitive trauma or pressure in the lower extremities discussed with patient and suggestions provided as to how to avoid this as well as the need for him to monitor the condition of his skin on a daily basis.      Genitourinary   ARF (acute renal failure)   Last Assessment & Plan   09/29/2012 Office Visit Written 09/29/2012  3:21 PM by Lendon Colonel, NP     Repeat BMET this week.      Other   Hyperlipidemia   Last Assessment & Plan   08/18/2012 Office Visit Written 08/18/2012  5:03 PM by Yehuda Savannah, MD     Options for treatment are limited, as the patient has suffered adverse reactions to multiple lipid-lowering medications.  It is unclear why he was advised that fenofibrate is contraindicated.  We have asked him to bring the letter that he received for review.    Hyponatremia   Last Assessment & Plan   10/24/2011 Office Visit Written 10/28/2011  8:37 AM by Yehuda Savannah, MD     Sodium of 128 when measured a few days ago.  This is partially due to a concomitant blood sugar of 368.  Continued monitoring will be necessary.      Noncompliance   Anemia, normocytic normochromic   Last Assessment & Plan   08/18/2012 Office Visit Written 08/18/2012  5:02 PM by Yehuda Savannah, MD     No prior laboratory evaluation apparently performed.  Iron studies and stool for Hemoccult testing will be obtained.    Edema   Last Assessment & Plan   08/18/2012 Office Visit  Written 08/18/2012  5:08 PM by Yehuda Savannah, MD     No etiology has been identified to account for his marked increase in peripheral edema.  He is on no particularly offensive drugs, but verapamil could be contributing and will be discontinued for now.  Further evaluation will include urinalysis, venous ultrasound of lower extremities and abdominal ultrasound.  He will monitor blood pressure at home.      Chest pain       Imaging: Dg Chest Port 1 View  04/27/2013   *RADIOLOGY REPORT*  Clinical Data: Chest pain.  PORTABLE CHEST - 1 VIEW  Comparison: 04/02/2013.  Findings: Low volume lungs.  Diffuse interstitial prominence is similar to priors when considering low volumes.  Mild cardiomegaly.  Unchanged upper mediastinal contours.  No significant effusion.  No pneumothorax.  Circumscribed lucent abnormality in the right humeral neck, likely degenerative.  IMPRESSION:  No definite active disease when accounting for low volume study.   Original Report Authenticated By: Jorje Guild

## 2013-05-04 NOTE — Progress Notes (Signed)
HPI: [name] is a 57 year old former patient of Dr. Lattie Haw, now being seen by Dr. Pennelope Bracken, we are following for ongoing assessment and management of CAD with recent admission to [facility] for ST elevation MI in January 2014 status is PCI and DES placement to the right coronary artery using a Promus Premier drug-eluting stent. The patient was also treated for CHF and echo demonstrated normal LV function.    The patient was recently admitted to [facility] in the setting of chest discomfort and ruled out for myocardial infarction, he was also treated for acute on chronic diastolic heart failure. Discharge weight was 257 pounds. Demadex was increased to 40 mg twice a day. On discharge the patient's sodium was 138 potassium 4.1 chloride 100 CO2 28 E1 31 creatinine 1.07.    He comes today with fluid retention in the LE with 6 lb weight gain from discharge and hypertension. He admits to eating salty foods to include bacon and ham biscuits. He is weighing himself at home and has not noticed a significant weight gain, but has noticed abdominal distention and LEE.  He denies chest pain or DOE.            Allergies  Allergen Reactions  . Hydrocodone Nausea And Vomiting  . Lisinopril Cough  . Neurontin [Gabapentin]     Side effects, suicidal thoughts  . Statins Other (See Comments)    Muscle aches    Current Outpatient Prescriptions  Medication Sig Dispense Refill  . albuterol (PROVENTIL) (2.5 MG/3ML) 0.083% nebulizer solution Take 3 mLs (2.5 mg total) by nebulization every 4 (four) hours as needed for wheezing.  75 mL  0  . ALPRAZolam (XANAX) 0.5 MG tablet Take 0.25-0.5 mg by mouth at bedtime as needed. Sleep aid      . aspirin EC 81 MG tablet Take 81 mg by mouth at bedtime.       . budesonide-formoterol (SYMBICORT) 160-4.5 MCG/ACT inhaler Inhale 2 puffs into the lungs 2 (two) times daily.      [name] doxazosin (CARDURA) 4 MG tablet Take 1 tablet (4 mg total) by mouth at bedtime.   30 tablet  11  . Flaxseed, Linseed, (FLAXSEED OIL) 1000 MG CAPS Take 1,000 mg by mouth 2 (two) times daily.       . insulin aspart (NOVOLOG) 100 UNIT/ML injection Inject 4-6 Units into the skin 2 (two) times daily. 4 units in am and 6 units at night.  1 vial  2  . insulin glargine (LANTUS) 100 UNIT/ML injection Inject 0.8 mLs (80 Units total) into the skin at bedtime.  10 mL  2  . metoprolol tartrate (LOPRESSOR) 25 MG tablet Take 1 tablet (25 mg total) by mouth 2 (two) times daily.  60 tablet  6  . metroNIDAZOLE (FLAGYL) 500 MG tablet Take 1 tablet (500 mg total) by mouth 3 (three) times daily. For 7 days  42 tablet  0  . nitroGLYCERIN (NITROSTAT) 0.4 MG SL tablet Place 1 tablet (0.4 mg total) under the tongue every 5 (five) minutes x 3 doses as needed for chest pain.  25 tablet  3  . prasugrel (EFFIENT) 10 MG TABS Take 1 tablet (10 mg total) by mouth daily.  30 tablet  6  . silver sulfADIAZINE (SILVADENE) 1 % cream Apply to affected area daily  100 g  1  . torsemide (DEMADEX) 20 MG tablet Take 40 mg in the morning and 40 mg at [facility].      [name]  valsartan (DIOVAN) 160 MG tablet Take 1 tablet (160 mg total) by mouth daily.  30 tablet  6   No current facility-administered medications for this visit.    Past Medical History  Diagnosis Date  . Arteriosclerotic cardiovascular disease (ASCVD)     a. 05/2011 Cath/PCI: LM nl, LAD 60m, D1 small, D2 small 60m, LCX large 40p, RCA 50-60p, 99 hazy @ origin of PDA with 70-80 in PDA (2.5x26 Resolute Integrity & 3.0x15 Resolute Integrity DES).;  b. 08/2012 Inflat  STEMI/Cath/PCI: LM minor irregs, LAD 50p, D1 50, LCX nl, OM1 25, RCA 30-40p, 100d (treated with 2.75x19mm Promus Premier DES);  c. 08/2012 Echo: EF 55-60%, basal inferopost HK.  [name] Hyperlipidemia   . Diabetes mellitus     Peripheral neuropathy  . Bell palsy   . Hypertension   . COPD (chronic obstructive pulmonary disease)   . Sleep apnea   . Gallstones   . Cholelithiasis 07/2012    Asymptomatic;  identified incidentally  . PONV (postoperative nausea and vomiting)   . Anxiety   . C. difficile colitis     a. 08/2012  . Myocardial infarct 09/08/12  . Nephrolithiasis   . Contrast dye induced nephropathy     a. 08/2012 post cath/pci  . CHF (congestive heart failure)   . Asthma   . Heart disease     Past Surgical History  Procedure Laterality Date  . Circumcision    . Stents    . Esophagogastroduodenoscopy      in [place] over 20 yrs ago  . Colonoscopy      In [facility], approximately 2011 per patient, was normal. Advised to come back in 10 years.  . Esophagogastroduodenoscopy  06/12/2012    Procedure: ESOPHAGOGASTRODUODENOSCOPY (EGD);  Surgeon: Daneil Dolin, MD;  Location: [facility];  Service: Endoscopy;  Laterality: N/A;  9:45  . Cardiac catheterization      [id number] of systems complete and found to be negative unless listed above  PHYSICAL EXAM BP 199/103  Pulse 91  Ht 6\' 1"  (1.854 m)  Wt 263 lb (119.296 kg)  BMI 34.71 kg/m2  General: Well developed, well nourished, in no acute distress Head: Eyes PERRLA, No xanthomas.   Normal cephalic and atramatic  Lungs: Clear bilaterally to auscultation, no wheezes or rhonchi Heart: HRRR S1 S2,.  Pulses are 2+ & equal.            No carotid bruit. No JVD.  Abdomen: Bowel sounds are positive, abdomen, distended and non-tender without masses or Hernia's noted. Msk:  Back normal, normal gait. Normal strength and tone for age. Extremities: No clubbing, cyanosis or 1+-2+pre-tibial pitting edema.  DP +1 Neuro: Alert and oriented X 3. Psych:  Good affect, responds appropriately    ASSESSMENT AND PLAN

## 2013-05-04 NOTE — Assessment & Plan Note (Addendum)
He has gained 6 lbs since discharge and is have abdominal distention with LEE. He admits to dietary non-compliance with salted meats. I have increased his torsemide to 40 mg in am and 60 mg in pm today, and 60 mg in am tomorrow with 40 mg in the afternoon. Then he is to go back to 40 mg BID. He is take the medication on an empty stomach. He is to call us in two days to let us know if he has improved in his edema and wt. If he has not lost a minimum of 5 lbs by Wed, we may need to increase his dose of torsemide to 60 mg in am and 40 mg in pm as routine dose.   BMET will be completed in 2 days to evaluate renal status. We will see him in a month unless he is worsened. He is given written instructions of low salt diet along with diet counseling in the clinic.

## 2013-05-06 ENCOUNTER — Telehealth: Payer: Self-pay | Admitting: *Deleted

## 2013-05-06 NOTE — Telephone Encounter (Signed)
gastic emptying study was order on pt. He was put in hospital the next day. Does he still need test. If so pt can go any afternoon.

## 2013-05-06 NOTE — Telephone Encounter (Signed)
I do recommend gastric emptying study. Please set it up. Have patient followup a few days after the test to discuss the results thank you

## 2013-05-07 ENCOUNTER — Other Ambulatory Visit (INDEPENDENT_AMBULATORY_CARE_PROVIDER_SITE_OTHER): Payer: PRIVATE HEALTH INSURANCE | Admitting: *Deleted

## 2013-05-07 ENCOUNTER — Telehealth: Payer: Self-pay | Admitting: Family Medicine

## 2013-05-07 ENCOUNTER — Telehealth: Payer: Self-pay | Admitting: *Deleted

## 2013-05-07 DIAGNOSIS — D649 Anemia, unspecified: Secondary | ICD-10-CM

## 2013-05-07 LAB — POC HEMOCCULT BLD/STL (HOME/3-CARD/SCREEN)
Card #2 Fecal Occult Blod, POC: NEGATIVE
Fecal Occult Blood, POC: NEGATIVE

## 2013-05-07 LAB — BASIC METABOLIC PANEL
Glucose, Bld: 170 mg/dL — ABNORMAL HIGH (ref 70–99)
Potassium: 4.1 mEq/L (ref 3.5–5.3)
Sodium: 136 mEq/L (ref 135–145)

## 2013-05-07 NOTE — Telephone Encounter (Signed)
[place] to see

## 2013-05-07 NOTE — Telephone Encounter (Signed)
FYI:Pt states he is feeling well and everything is going good so far. Please advise if any changes need to be made.

## 2013-05-07 NOTE — Telephone Encounter (Signed)
Would like refill on tramadol and xanax [facility] in [place]. Pt last seen 08/14

## 2013-05-07 NOTE — Telephone Encounter (Signed)
Discussed with patient

## 2013-05-07 NOTE — Telephone Encounter (Signed)
Pt calling just to report that he is down 5 lbs and BP 146/86 x 3 days

## 2013-05-07 NOTE — Telephone Encounter (Signed)
Test scheduled aph 05/13/13 register 9:45 for a 10am appt. Nothing to eat or drink after midnight and no stomach meds. Pt notified

## 2013-05-07 NOTE — Telephone Encounter (Signed)
Pt would like refill on tradamol and xanax. [employer].

## 2013-05-07 NOTE — Telephone Encounter (Signed)
Patient is calling regarding a question he has about the specimen he is supposed to be getting to [place]. Please advise.

## 2013-05-07 NOTE — Telephone Encounter (Signed)
DR. Nicki Reaper ok with 3 refills. Pt notified.

## 2013-05-08 ENCOUNTER — Encounter: Payer: PRIVATE HEALTH INSURANCE | Admitting: Adult Health

## 2013-05-08 ENCOUNTER — Encounter: Payer: Self-pay | Admitting: *Deleted

## 2013-05-08 ENCOUNTER — Telehealth: Payer: Self-pay | Admitting: Family Medicine

## 2013-05-08 ENCOUNTER — Other Ambulatory Visit: Payer: Self-pay | Admitting: *Deleted

## 2013-05-08 MED ORDER — ALPRAZOLAM 0.5 MG PO TABS: ORAL_TABLET | ORAL | Status: DC

## 2013-05-08 MED ORDER — TRAMADOL HCL 50 MG PO TABS: 50.0000 mg | ORAL_TABLET | Freq: Four times a day (QID) | ORAL | Status: DC | PRN

## 2013-05-08 NOTE — Telephone Encounter (Signed)
Noted, thanks!

## 2013-05-08 NOTE — Telephone Encounter (Signed)
error 

## 2013-05-11 ENCOUNTER — Other Ambulatory Visit: Payer: Self-pay | Admitting: Family Medicine

## 2013-05-11 LAB — BASIC METABOLIC PANEL
BUN: 30 mg/dL — ABNORMAL HIGH (ref 6–23)
Chloride: 101 mEq/L (ref 96–112)
Glucose, Bld: 167 mg/dL — ABNORMAL HIGH (ref 70–99)
Potassium: 4.9 mEq/L (ref 3.5–5.3)
Sodium: 137 mEq/L (ref 135–145)

## 2013-05-11 LAB — HEPATIC FUNCTION PANEL
AST: 16 U/L (ref 0–37)
Alkaline Phosphatase: 70 U/L (ref 39–117)
Bilirubin, Direct: <0.1 mg/dL (ref 0.0–0.3)
Total Bilirubin: 0.5 mg/dL (ref 0.3–1.2)

## 2013-05-11 LAB — PSA: PSA: 0.57 ng/mL (ref <=4.00)

## 2013-05-12 LAB — CREATININE CLEARANCE, URINE, 24 HOUR
Creatinine Clearance: 96 mL/min (ref 75–125)
Creatinine: 1.24 mg/dL (ref 0.50–1.35)

## 2013-05-12 LAB — VITAMIN B12: Vitamin B-12: 378 pg/mL (ref 211–911)

## 2013-05-12 LAB — MICROALBUMIN, URINE: Microalb, Ur: 213.69 mg/dL — ABNORMAL HIGH (ref 0.00–1.89)

## 2013-05-12 LAB — FERRITIN: Ferritin: 313 ng/mL (ref 22–322)

## 2013-05-13 ENCOUNTER — Encounter (HOSPITAL_COMMUNITY): Payer: Self-pay

## 2013-05-13 ENCOUNTER — Encounter (HOSPITAL_COMMUNITY)
Admission: RE | Admit: 2013-05-13 | Discharge: 2013-05-13 | Disposition: A | Discharge status: Home / Self Care | Payer: PRIVATE HEALTH INSURANCE | Source: Ambulatory Visit | Attending: Family Medicine | Admitting: Family Medicine

## 2013-05-13 DIAGNOSIS — R109 Unspecified abdominal pain: Secondary | ICD-10-CM | POA: Insufficient documentation

## 2013-05-13 DIAGNOSIS — K3189 Other diseases of stomach and duodenum: Secondary | ICD-10-CM | POA: Insufficient documentation

## 2013-05-13 LAB — CBC WITH DIFFERENTIAL/PLATELET

## 2013-05-13 LAB — SEDIMENTATION RATE

## 2013-05-13 LAB — HEMOGLOBIN A1C

## 2013-05-13 MED ORDER — TECHNETIUM TC 99M SULFUR COLLOID
2.0000 | Freq: Once | INTRAVENOUS | Status: AC | PRN
  Administered 2013-05-13: 2 via ORAL

## 2013-05-21 ENCOUNTER — Encounter: Payer: Self-pay | Admitting: Family Medicine

## 2013-05-21 ENCOUNTER — Ambulatory Visit (INDEPENDENT_AMBULATORY_CARE_PROVIDER_SITE_OTHER): Payer: PRIVATE HEALTH INSURANCE | Admitting: Family Medicine

## 2013-05-21 VITALS — BP 158/90 | Ht 73.0 in | Wt 262.4 lb

## 2013-05-21 DIAGNOSIS — E119 Type 2 diabetes mellitus without complications: Secondary | ICD-10-CM

## 2013-05-21 DIAGNOSIS — E1143 Type 2 diabetes mellitus with diabetic autonomic (poly)neuropathy: Secondary | ICD-10-CM

## 2013-05-21 DIAGNOSIS — G609 Hereditary and idiopathic neuropathy, unspecified: Secondary | ICD-10-CM

## 2013-05-21 DIAGNOSIS — K3184 Gastroparesis: Secondary | ICD-10-CM

## 2013-05-21 DIAGNOSIS — G629 Polyneuropathy, unspecified: Secondary | ICD-10-CM

## 2013-05-21 DIAGNOSIS — E1149 Type 2 diabetes mellitus with other diabetic neurological complication: Secondary | ICD-10-CM

## 2013-05-21 DIAGNOSIS — R609 Edema, unspecified: Secondary | ICD-10-CM

## 2013-05-21 DIAGNOSIS — I10 Essential (primary) hypertension: Secondary | ICD-10-CM

## 2013-05-21 DIAGNOSIS — Z23 Encounter for immunization: Secondary | ICD-10-CM

## 2013-05-21 MED ORDER — VALSARTAN 320 MG PO TABS: 320.0000 mg | ORAL_TABLET | Freq: Every day | ORAL | Status: DC

## 2013-05-21 NOTE — Progress Notes (Signed)
  Subjective:    Patient ID: Eugene [name], male    DOB: 29-Sep-1955, 57 y.o.   MRN: [id number]  HPI Patient is here today to f/u on gastric emptying study. Patient does in fact have gastroparesis. It is not severe. I wouldn't would recommend that we try to avoid medication currently because I am concerned that could result with side effects with medication  Also talked at length about his ability to do activities. He is severely impaired between his COPD, hypertension, diabetes, heart disease, peripheral neuropathy, and chronic leg weakness. He is completely disabled. He also has with him and insurance forms to fill out. This was went over point by point. 30 minutes was spent with the patient today  I also reviewed his medication list his blood pressure was elevated today  Family history noncontributory social does not smoke  Review of Systems See above denies headaches does have increased thirst urination has numbness in his legs and in his hands weakness in his legs shortness of breath with activity    Objective:   Physical Exam  Neck no masses lungs are clear no crackles heart is regular abdomen soft extremities with 1-2+ edema dense neuropathy of the feet some neuropathy of the hands      Assessment & Plan:  #1 gastroparesis I think the best approach is small meals frequently avoid eating any large meals at one time hold off on medications #2 severe neuropathy and this impairs his ability in regards to walking standing he does have balance issues and potential for falling #3 his insurance form was filled out a letter will be dictated to go with that #4 hypertension increase Diovan from 160 mg to 320 mg. He will followup with cardiology next month he'll followup with [place] in 3 months

## 2013-05-21 NOTE — Patient Instructions (Signed)
Increase the diovan to 320 mg  Make sure cardiology checks your BP in Oct  DASH Diet The DASH diet stands for "Dietary Approaches to Stop Hypertension." It is a healthy eating plan that has been shown to reduce high blood pressure (hypertension) in as little as 14 days, while also possibly providing other significant health benefits. These other health benefits include reducing the risk of breast cancer after menopause and reducing the risk of type 2 diabetes, heart disease, colon cancer, and stroke. Health benefits also include weight loss and slowing kidney failure in patients with chronic kidney disease.  DIET GUIDELINES  Limit salt (sodium). Your diet should contain less than 1500 mg of sodium daily.  Limit refined or processed carbohydrates. Your diet should include mostly whole grains. Desserts and added sugars should be used sparingly.  Include small amounts of heart-healthy fats. These types of fats include nuts, oils, and tub margarine. Limit saturated and trans fats. These fats have been shown to be harmful in the body. CHOOSING FOODS  The following food groups are based on a 2000 calorie diet. See your Registered Dietitian for individual calorie needs. Grains and Grain Products (6 to 8 servings daily)  Eat More Often: Whole-wheat bread, brown rice, whole-grain or wheat pasta, quinoa, popcorn without added fat or salt (air popped).  Eat Less Often: White bread, white pasta, white rice, cornbread. Vegetables (4 to 5 servings daily)  Eat More Often: Fresh, frozen, and canned vegetables. Vegetables may be raw, steamed, roasted, or grilled with a minimal amount of fat.  Eat Less Often/Avoid: Creamed or fried vegetables. Vegetables in a cheese sauce. Fruit (4 to 5 servings daily)  Eat More Often: All fresh, canned (in natural juice), or frozen fruits. Dried fruits without added sugar. One hundred percent fruit juice ( cup [237 mL] daily).  Eat Less Often: Dried fruits with added  sugar. Canned fruit in light or heavy syrup. [employer], Fish, and Poultry (2 servings or less daily. One serving is 3 to 4 oz [85-114 g]).  Eat More Often: Ninety percent or leaner ground beef, tenderloin, sirloin. Round cuts of beef, chicken breast, [place] breast. All fish. Grill, bake, or broil your meat. Nothing should be fried.  Eat Less Often/Avoid: Fatty cuts of meat, [place], or chicken leg, thigh, or wing. Fried cuts of meat or fish. Dairy (2 to 3 servings)  Eat More Often: Low-fat or fat-free milk, low-fat plain or light yogurt, reduced-fat or part-skim cheese.  Eat Less Often/Avoid: Milk (whole, 2%).Whole milk yogurt. Full-fat cheeses. Nuts, Seeds, and Legumes (4 to 5 servings per week)  Eat More Often: All without added salt.  Eat Less Often/Avoid: Salted nuts and seeds, canned beans with added salt. Fats and Sweets (limited)  Eat More Often: Vegetable oils, tub margarines without trans fats, sugar-free gelatin. Mayonnaise and salad dressings.  Eat Less Often/Avoid: Coconut oils, palm oils, butter, stick margarine, cream, half and half, cookies, candy, pie. FOR MORE INFORMATION The Dash Diet Eating Plan: www.dashdiet.org Document Released: 08/02/2011 Document Revised: 11/05/2011 Document Reviewed: 08/02/2011 [facility] Patient Information 2014 [place], [place].

## 2013-06-04 ENCOUNTER — Other Ambulatory Visit: Payer: Self-pay | Admitting: *Deleted

## 2013-06-04 ENCOUNTER — Telehealth: Payer: Self-pay | Admitting: *Deleted

## 2013-06-04 MED ORDER — AMLODIPINE BESYLATE 5 MG PO TABS: 5.0000 mg | ORAL_TABLET | Freq: Every day | ORAL | Status: DC

## 2013-06-04 NOTE — Telephone Encounter (Signed)
Add amlodipine 5 mg daily, follow up next week, sooner if worse

## 2013-06-04 NOTE — Telephone Encounter (Signed)
BP high and having headache x 3 days. Today BP 190/108, last last 175/93. Diovan was increased from 160mg  to 320mg  about 2 weeks ago. Taking torsemide 20mg  one in the am and one in the noon. Metoprolol 25mg  one bid.  [place] [facility] in [place].

## 2013-06-04 NOTE — Telephone Encounter (Signed)
Discussed with patient. appt made and med sent to pharm.

## 2013-06-04 NOTE — Telephone Encounter (Signed)
BP high and having a headache x 3 days. Today BP 190/108, last night 175/93.

## 2013-06-04 NOTE — Telephone Encounter (Signed)
[facility]. Med sent to pharm.

## 2013-06-09 ENCOUNTER — Ambulatory Visit: Payer: PRIVATE HEALTH INSURANCE | Admitting: Family Medicine

## 2013-06-16 ENCOUNTER — Telehealth: Payer: Self-pay | Admitting: *Deleted

## 2013-06-16 DIAGNOSIS — E785 Hyperlipidemia, unspecified: Secondary | ICD-10-CM

## 2013-06-16 MED ORDER — FENOFIBRATE 160 MG PO TABS: 160.0000 mg | ORAL_TABLET | Freq: Every day | ORAL | Status: DC

## 2013-06-16 NOTE — Telephone Encounter (Signed)
Notified patient Dr. Nicki Reaper reviewed bloodwork. TG too high. Should maintain healthly diet. Start fenofibrate 160mg  #30 one qd 4 refills. Recheck lipid in 4 weeks. Follow up with office visit by start of Jan 2015. Medication was sent into pharmacy and blood work was ordered.

## 2013-06-16 NOTE — Telephone Encounter (Signed)
[facility] 06/16/13

## 2013-06-16 NOTE — Telephone Encounter (Signed)
Dr. Nicki Reaper reviewed bloodwork. TG too high. Should maintain healthly diet. Start fenofibrate 160mg  #30 one qd 4 refills. Recheck lipid in 4 weeks. Follow up with office visit by start of Jan 2015.

## 2013-06-18 ENCOUNTER — Encounter (HOSPITAL_COMMUNITY): Payer: Self-pay | Admitting: Emergency Medicine

## 2013-06-18 ENCOUNTER — Emergency Department (HOSPITAL_COMMUNITY): Payer: PRIVATE HEALTH INSURANCE

## 2013-06-18 ENCOUNTER — Inpatient Hospital Stay (HOSPITAL_COMMUNITY)
Admission: EM | Admit: 2013-06-18 | Discharge: 2013-06-26 | DRG: 237 | Disposition: A | Discharge status: Home / Self Care | Payer: PRIVATE HEALTH INSURANCE | Attending: Cardiology | Admitting: Cardiology

## 2013-06-18 DIAGNOSIS — R609 Edema, unspecified: Secondary | ICD-10-CM

## 2013-06-18 DIAGNOSIS — I10 Essential (primary) hypertension: Secondary | ICD-10-CM | POA: Diagnosis present

## 2013-06-18 DIAGNOSIS — I129 Hypertensive chronic kidney disease with stage 1 through stage 4 chronic kidney disease, or unspecified chronic kidney disease: Secondary | ICD-10-CM | POA: Diagnosis present

## 2013-06-18 DIAGNOSIS — I309 Acute pericarditis, unspecified: Principal | ICD-10-CM | POA: Diagnosis present

## 2013-06-18 DIAGNOSIS — I509 Heart failure, unspecified: Secondary | ICD-10-CM | POA: Diagnosis present

## 2013-06-18 DIAGNOSIS — I2510 Atherosclerotic heart disease of native coronary artery without angina pectoris: Secondary | ICD-10-CM | POA: Diagnosis present

## 2013-06-18 DIAGNOSIS — F411 Generalized anxiety disorder: Secondary | ICD-10-CM | POA: Diagnosis present

## 2013-06-18 DIAGNOSIS — N179 Acute kidney failure, unspecified: Secondary | ICD-10-CM | POA: Diagnosis present

## 2013-06-18 DIAGNOSIS — E1149 Type 2 diabetes mellitus with other diabetic neurological complication: Secondary | ICD-10-CM

## 2013-06-18 DIAGNOSIS — K219 Gastro-esophageal reflux disease without esophagitis: Secondary | ICD-10-CM | POA: Diagnosis present

## 2013-06-18 DIAGNOSIS — I5033 Acute on chronic diastolic (congestive) heart failure: Secondary | ICD-10-CM | POA: Diagnosis present

## 2013-06-18 DIAGNOSIS — E871 Hypo-osmolality and hyponatremia: Secondary | ICD-10-CM | POA: Diagnosis present

## 2013-06-18 DIAGNOSIS — Z9861 Coronary angioplasty status: Secondary | ICD-10-CM

## 2013-06-18 DIAGNOSIS — J449 Chronic obstructive pulmonary disease, unspecified: Secondary | ICD-10-CM | POA: Diagnosis present

## 2013-06-18 DIAGNOSIS — E1129 Type 2 diabetes mellitus with other diabetic kidney complication: Secondary | ICD-10-CM | POA: Diagnosis present

## 2013-06-18 DIAGNOSIS — N183 Chronic kidney disease, stage 3 unspecified: Secondary | ICD-10-CM | POA: Diagnosis present

## 2013-06-18 DIAGNOSIS — D649 Anemia, unspecified: Secondary | ICD-10-CM | POA: Diagnosis present

## 2013-06-18 DIAGNOSIS — I472 Ventricular tachycardia, unspecified: Secondary | ICD-10-CM | POA: Diagnosis not present

## 2013-06-18 DIAGNOSIS — I313 Pericardial effusion (noninflammatory): Secondary | ICD-10-CM

## 2013-06-18 DIAGNOSIS — E785 Hyperlipidemia, unspecified: Secondary | ICD-10-CM | POA: Diagnosis present

## 2013-06-18 DIAGNOSIS — Z7982 Long term (current) use of aspirin: Secondary | ICD-10-CM

## 2013-06-18 DIAGNOSIS — I5021 Acute systolic (congestive) heart failure: Secondary | ICD-10-CM

## 2013-06-18 DIAGNOSIS — I252 Old myocardial infarction: Secondary | ICD-10-CM

## 2013-06-18 DIAGNOSIS — E1142 Type 2 diabetes mellitus with diabetic polyneuropathy: Secondary | ICD-10-CM | POA: Diagnosis present

## 2013-06-18 DIAGNOSIS — Z79899 Other long term (current) drug therapy: Secondary | ICD-10-CM

## 2013-06-18 DIAGNOSIS — Z794 Long term (current) use of insulin: Secondary | ICD-10-CM

## 2013-06-18 DIAGNOSIS — I5031 Acute diastolic (congestive) heart failure: Secondary | ICD-10-CM

## 2013-06-18 DIAGNOSIS — I4729 Other ventricular tachycardia: Secondary | ICD-10-CM | POA: Diagnosis not present

## 2013-06-18 DIAGNOSIS — J4489 Other specified chronic obstructive pulmonary disease: Secondary | ICD-10-CM | POA: Diagnosis present

## 2013-06-18 DIAGNOSIS — N189 Chronic kidney disease, unspecified: Secondary | ICD-10-CM | POA: Diagnosis present

## 2013-06-18 HISTORY — DX: Old myocardial infarction: I25.2

## 2013-06-18 LAB — CBC WITH DIFFERENTIAL/PLATELET
Basophils Absolute: 0 10*3/uL (ref 0.0–0.1)
Basophils Relative: 0 % (ref 0–1)
Hemoglobin: 11 g/dL — ABNORMAL LOW (ref 13.0–17.0)
Lymphocytes Relative: 30 % (ref 12–46)
Lymphs Abs: 2.2 10*3/uL (ref 0.7–4.0)
MCHC: 35.3 g/dL (ref 30.0–36.0)
MCV: 85.7 fL (ref 78.0–100.0)
Monocytes Absolute: 0.6 10*3/uL (ref 0.1–1.0)
Neutro Abs: 4.5 10*3/uL (ref 1.7–7.7)
Neutrophils Relative %: 61 % (ref 43–77)
RBC: 3.64 MIL/uL — ABNORMAL LOW (ref 4.22–5.81)
RDW: 13.7 % (ref 11.5–15.5)
WBC: 7.4 10*3/uL (ref 4.0–10.5)

## 2013-06-18 LAB — COMPREHENSIVE METABOLIC PANEL
AST: 14 U/L (ref 0–37)
Albumin: 3.5 g/dL (ref 3.5–5.2)
Alkaline Phosphatase: 90 U/L (ref 39–117)
Chloride: 97 mEq/L (ref 96–112)
Potassium: 4.6 mEq/L (ref 3.5–5.1)
Total Bilirubin: 0.3 mg/dL (ref 0.3–1.2)

## 2013-06-18 LAB — TROPONIN I: Troponin I: <0.3 ng/mL (ref <0.30)

## 2013-06-18 MED ORDER — IOHEXOL 300 MG/ML  SOLN: 50.0000 mL | Freq: Once | INTRAMUSCULAR | Status: AC | PRN

## 2013-06-18 MED ORDER — PANTOPRAZOLE SODIUM 40 MG IV SOLR
40.0000 mg | Freq: Once | INTRAVENOUS | Status: AC
  Administered 2013-06-18: 40 mg via INTRAVENOUS
  Filled 2013-06-18: qty 40

## 2013-06-18 MED ORDER — ONDANSETRON HCL 4 MG/2ML IJ SOLN
4.0000 mg | Freq: Once | INTRAMUSCULAR | Status: AC
  Administered 2013-06-18: 4 mg via INTRAVENOUS
  Filled 2013-06-18: qty 2

## 2013-06-18 MED ORDER — HYDROMORPHONE HCL PF 1 MG/ML IJ SOLN
1.0000 mg | Freq: Once | INTRAMUSCULAR | Status: DC
  Filled 2013-06-18: qty 1

## 2013-06-18 MED ORDER — IOHEXOL 300 MG/ML  SOLN
50.0000 mL | Freq: Once | INTRAMUSCULAR | Status: AC | PRN
  Administered 2013-06-18: 50 mL via ORAL

## 2013-06-18 MED ORDER — IOHEXOL 300 MG/ML  SOLN: 80.0000 mL | Freq: Once | INTRAMUSCULAR | Status: DC | PRN

## 2013-06-18 NOTE — ED Provider Notes (Signed)
CSN: [id number]     Arrival date & time 06/18/13  2040 History  This chart was scribed for Maudry Diego, MD by Jenne Campus, ED Scribe. This patient was seen in room APA16A/APA16A and the patient's care was started at 9:22 PM.   Chief Complaint  Patient presents with  . Shortness of Breath  . Abdominal Pain    Patient is a 57 y.o. male presenting with abdominal pain. The history is provided by the patient. No language interpreter was used.  Abdominal Pain Pain location:  LUQ Pain quality comment:  Twisting Pain radiates to:  Does not radiate Pain severity:  Moderate Onset quality:  Gradual Duration:  2 days Timing:  Constant Progression:  Worsening Chronicity:  New Relieved by:  Nothing Worsened by:  Nothing tried Associated symptoms: shortness of breath (secondary to pain)   Associated symptoms: no chest pain, no cough, no diarrhea, no fatigue and no hematuria     HPI Comments: Eugene [name] is a 57 y.o. male who presents to the Emergency Department complaining of gradually worsening LUQ abdominal pain that he describes as a twisting, "busting wide open" feeling that started 2 days ago. He denies any modifying factors but states that he feels SOB secondary to the pain. He admits that he has been experiencing NP cough over the past few days as well. He denies any prior episodes of the same symptoms. He has a h/o MI in January 2014 of this year and states that he only had SOB at that time. He states that due to the absence of pain during that episode, he was afraid that he could be having another MI.  Past Medical History  Diagnosis Date  . Arteriosclerotic cardiovascular disease (ASCVD)     a. 05/2011 Cath/PCI: LM nl, LAD 61m, D1 small, D2 small 50m, LCX large 40p, RCA 50-60p, 99 hazy @ origin of PDA with 70-80 in PDA (2.5x26 Resolute Integrity & 3.0x15 Resolute Integrity DES).;  b. 08/2012 Inflat  STEMI/Cath/PCI: LM minor irregs, LAD 50p, D1 50, LCX nl, OM1 25, RCA  30-40p, 100d (treated with 2.75x31mm Promus Premier DES);  c. 08/2012 Echo: EF 55-60%, basal inferopost HK.  [name] Hyperlipidemia   . Diabetes mellitus     Peripheral neuropathy  . Bell palsy   . Hypertension   . COPD (chronic obstructive pulmonary disease)   . Sleep apnea   . Gallstones   . Cholelithiasis 07/2012    Asymptomatic; identified incidentally  . PONV (postoperative nausea and vomiting)   . Anxiety   . C. difficile colitis     a. 08/2012  . Myocardial infarct 09/08/12  . Nephrolithiasis   . Contrast dye induced nephropathy     a. 08/2012 post cath/pci  . CHF (congestive heart failure)   . Asthma   . Heart disease    Past Surgical History  Procedure Laterality Date  . Circumcision    . Stents    . Esophagogastroduodenoscopy      in [place] over 20 yrs ago  . Colonoscopy      In [facility], approximately 2011 per patient, was normal. Advised to come back in 10 years.  . Esophagogastroduodenoscopy  06/12/2012    Procedure: ESOPHAGOGASTRODUODENOSCOPY (EGD);  Surgeon: Daneil Dolin, MD;  Location: [facility];  Service: Endoscopy;  Laterality: N/A;  9:45  . Cardiac catheterization     Family History  Problem Relation Age of Onset  . Diabetes Mother   . Heart attack Mother   .  Stroke Mother   . Diabetes Sister   . Sleep apnea Sister   . Hypertension Brother   . Diabetes Brother   . Colon cancer Neg Hx   . Liver disease Neg Hx   . Diabetes Brother   . Hypertension Brother    History  Substance Use Topics  . Smoking status: Never Smoker   . Smokeless tobacco: Not on file  . Alcohol Use: No     Comment: heavy etoh use 30 years ago    Review of Systems  Constitutional: Negative for appetite change and fatigue.  HENT: Negative for congestion, ear discharge and sinus pressure.   Eyes: Negative for discharge.  Respiratory: Positive for shortness of breath (secondary to pain). Negative for cough.   Cardiovascular: Negative for chest pain.   Gastrointestinal: Positive for abdominal pain. Negative for diarrhea.  Genitourinary: Negative for frequency and hematuria.  Musculoskeletal: Negative for back pain.  Skin: Negative for rash.  Neurological: Negative for seizures and headaches.  Psychiatric/Behavioral: Negative for hallucinations.    Allergies  Hydrocodone; Lisinopril; Neurontin; and Statins  Home Medications   Current Outpatient Rx  Name  Route  Sig  Dispense  Refill  . ALPRAZolam (XANAX) 0.5 MG tablet      Take one half to one tablet by mouth at bedtime as needed for sleep.   30 tablet   2   . amLODipine (NORVASC) 5 MG tablet   Oral   Take 1 tablet (5 mg total) by mouth daily.   30 tablet   0   . aspirin EC 81 MG tablet   Oral   Take 81 mg by mouth at bedtime.          . budesonide-formoterol (SYMBICORT) 160-4.5 MCG/ACT inhaler   Inhalation   Inhale 2 puffs into the lungs 2 (two) times daily.         [name] doxazosin (CARDURA) 4 MG tablet   Oral   Take 1 tablet (4 mg total) by mouth at bedtime.   30 tablet   11   . fenofibrate 160 MG tablet   Oral   Take 160 mg by mouth at bedtime.         . insulin glargine (LANTUS) 100 UNIT/ML injection   Subcutaneous   Inject 0.8 mLs (80 Units total) into the skin at bedtime.   10 mL   2   . metoprolol tartrate (LOPRESSOR) 25 MG tablet   Oral   Take 25-50 mg by mouth at bedtime.         . nitroGLYCERIN (NITROSTAT) 0.4 MG SL tablet   Sublingual   Place 1 tablet (0.4 mg total) under the tongue every 5 (five) minutes x 3 doses as needed for chest pain.   25 tablet   3   . pantoprazole (PROTONIX) 40 MG tablet   Oral   Take 40-80 mg by mouth at bedtime.          . prasugrel (EFFIENT) 10 MG TABS tablet   Oral   Take 10 mg by mouth at bedtime.         . torsemide (DEMADEX) 20 MG tablet   Oral   Take 20-40 mg by mouth 2 (two) times daily. Two tablets in the morning and one tablet at bedtime         . traMADol (ULTRAM) 50 MG tablet    Oral   Take 1 tablet (50 mg total) by mouth every 6 (six) hours as needed for pain.  24 tablet   2   . valsartan (DIOVAN) 320 MG tablet   Oral   Take 320 mg by mouth at bedtime.          Triage Vitals: BP 174/87  Pulse 89  Temp(Src) 98.1 F (36.7 C) (Oral)  Resp 19  SpO2 96%  Physical Exam  Nursing note and vitals reviewed. Constitutional: He is oriented to person, place, and time. He appears well-developed and well-nourished.  HENT:  Head: Normocephalic and atraumatic.  Eyes: Conjunctivae and EOM are normal. No scleral icterus.  Neck: Neck supple. No thyromegaly present.  Cardiovascular: Normal rate and regular rhythm.  Exam reveals no gallop and no friction rub.   No murmur heard. Pulmonary/Chest: Effort normal and breath sounds normal. No stridor. He has no wheezes. He has no rales. He exhibits no tenderness.  Abdominal: Soft. He exhibits no distension. There is tenderness (mild LUQ tenderness). There is no rebound.  Musculoskeletal: Normal range of motion. He exhibits no edema.  Lymphadenopathy:    He has no cervical adenopathy.  Neurological: He is alert and oriented to person, place, and time. He exhibits normal muscle tone. Coordination normal.  Skin: Skin is warm and dry. No rash noted. No erythema.  Psychiatric: He has a normal mood and affect. His behavior is normal.    ED Course  Procedures (including critical care time)  DIAGNOSTIC STUDIES: Oxygen Saturation is 96% on room air, normal by my interpretation.    COORDINATION OF CARE: 9:27 PM-Discussed treatment plan which includes  (CXR, CBC panel, CMP, UA) with pt at bedside and pt agreed to plan.   Labs Review Labs Reviewed - No data to display Imaging Review No results found.  EKG Interpretation     Ventricular Rate:  92 PR Interval:  170 QRS Duration: 100 QT Interval:  342 QTC Calculation: 422 R Axis:   25 Text Interpretation:  Normal sinus rhythm Incomplete right bundle branch block  Borderline ECG When compared with ECG of 26-Apr-2013 23:17, No significant change was found            MDM  No diagnosis found.  Transfer to [facility] [place] The chart was scribed for me under my direct supervision.  I personally performed the history, physical, and medical decision making and all procedures in the evaluation of this patient.Maudry Diego, MD 06/18/13 [phone]

## 2013-06-18 NOTE — ED Notes (Signed)
Pt states his stools have been whitish in color, EDP notified.

## 2013-06-18 NOTE — ED Notes (Signed)
Pt states he began feeling sob several days ago, now having upper left abd pain.  Pt states he had MI in January

## 2013-06-19 ENCOUNTER — Ambulatory Visit: Payer: PRIVATE HEALTH INSURANCE | Admitting: Adult Health

## 2013-06-19 DIAGNOSIS — I319 Disease of pericardium, unspecified: Secondary | ICD-10-CM

## 2013-06-19 DIAGNOSIS — R609 Edema, unspecified: Secondary | ICD-10-CM

## 2013-06-19 DIAGNOSIS — R079 Chest pain, unspecified: Secondary | ICD-10-CM

## 2013-06-19 DIAGNOSIS — I7090 Unspecified atherosclerosis: Secondary | ICD-10-CM

## 2013-06-19 DIAGNOSIS — I2510 Atherosclerotic heart disease of native coronary artery without angina pectoris: Secondary | ICD-10-CM

## 2013-06-19 LAB — GLUCOSE, CAPILLARY
Glucose-Capillary: 168 mg/dL — ABNORMAL HIGH (ref 70–99)
Glucose-Capillary: 171 mg/dL — ABNORMAL HIGH (ref 70–99)
Glucose-Capillary: 138 mg/dL — ABNORMAL HIGH (ref 70–99)

## 2013-06-19 LAB — TROPONIN I: Troponin I: <0.3 ng/mL (ref <0.30)

## 2013-06-19 LAB — PRO B NATRIURETIC PEPTIDE: Pro B Natriuretic peptide (BNP): 118.6 pg/mL (ref 0–125)

## 2013-06-19 MED ORDER — COLCHICINE 0.6 MG PO TABS
0.6000 mg | ORAL_TABLET | Freq: Two times a day (BID) | ORAL | Status: DC
  Administered 2013-06-19 – 2013-06-23 (×8): 0.6 mg via ORAL
  Filled 2013-06-19 (×12): qty 1

## 2013-06-19 MED ORDER — FENOFIBRATE 160 MG PO TABS
160.0000 mg | ORAL_TABLET | Freq: Every day | ORAL | Status: DC
  Administered 2013-06-19 – 2013-06-25 (×7): 160 mg via ORAL
  Filled 2013-06-19 (×9): qty 1

## 2013-06-19 MED ORDER — SODIUM CHLORIDE 0.9 % IJ SOLN: 3.0000 mL | INTRAMUSCULAR | Status: DC | PRN

## 2013-06-19 MED ORDER — SODIUM CHLORIDE 0.9 % IV SOLN: 250.0000 mL | INTRAVENOUS | Status: DC | PRN

## 2013-06-19 MED ORDER — ACETAMINOPHEN 325 MG PO TABS: 650.0000 mg | ORAL_TABLET | ORAL | Status: DC | PRN

## 2013-06-19 MED ORDER — TUBERCULIN PPD 5 UNIT/0.1ML ID SOLN
5.0000 [IU] | Freq: Once | INTRADERMAL | Status: AC
  Administered 2013-06-19: 5 [IU] via INTRADERMAL
  Filled 2013-06-19: qty 0.1

## 2013-06-19 MED ORDER — INSULIN ASPART 100 UNIT/ML ~~LOC~~ SOLN
0.0000 [IU] | Freq: Three times a day (TID) | SUBCUTANEOUS | Status: DC
  Administered 2013-06-20: 8 [IU] via SUBCUTANEOUS
  Administered 2013-06-20: 5 [IU] via SUBCUTANEOUS
  Administered 2013-06-20: 8 [IU] via SUBCUTANEOUS
  Administered 2013-06-21: 5 [IU] via SUBCUTANEOUS
  Administered 2013-06-21: 3 [IU] via SUBCUTANEOUS

## 2013-06-19 MED ORDER — AMLODIPINE BESYLATE 5 MG PO TABS
5.0000 mg | ORAL_TABLET | Freq: Every day | ORAL | Status: DC
  Administered 2013-06-19 – 2013-06-24 (×5): 5 mg via ORAL
  Filled 2013-06-19 (×7): qty 1

## 2013-06-19 MED ORDER — ONDANSETRON HCL 4 MG/2ML IJ SOLN: 4.0000 mg | Freq: Four times a day (QID) | INTRAMUSCULAR | Status: DC | PRN

## 2013-06-19 MED ORDER — PRASUGREL HCL 10 MG PO TABS
10.0000 mg | ORAL_TABLET | Freq: Every day | ORAL | Status: DC
  Administered 2013-06-19 – 2013-06-21 (×3): 10 mg via ORAL
  Filled 2013-06-19 (×4): qty 1

## 2013-06-19 MED ORDER — DOXAZOSIN MESYLATE 4 MG PO TABS
4.0000 mg | ORAL_TABLET | Freq: Every day | ORAL | Status: DC
  Administered 2013-06-19 – 2013-06-25 (×7): 4 mg via ORAL
  Filled 2013-06-19 (×9): qty 1

## 2013-06-19 MED ORDER — INSULIN ASPART 100 UNIT/ML ~~LOC~~ SOLN
0.0000 [IU] | Freq: Every day | SUBCUTANEOUS | Status: DC
  Administered 2013-06-19 – 2013-06-20 (×2): 3 [IU] via SUBCUTANEOUS

## 2013-06-19 MED ORDER — ASPIRIN EC 81 MG PO TBEC
81.0000 mg | DELAYED_RELEASE_TABLET | Freq: Every day | ORAL | Status: DC
  Administered 2013-06-19 – 2013-06-21 (×3): 81 mg via ORAL
  Filled 2013-06-19 (×4): qty 1

## 2013-06-19 MED ORDER — SODIUM CHLORIDE 0.9 % IJ SOLN
3.0000 mL | Freq: Two times a day (BID) | INTRAMUSCULAR | Status: DC
  Administered 2013-06-19 – 2013-06-23 (×7): 3 mL via INTRAVENOUS

## 2013-06-19 MED ORDER — TORSEMIDE 20 MG PO TABS
40.0000 mg | ORAL_TABLET | Freq: Every day | ORAL | Status: DC
  Administered 2013-06-19 – 2013-06-22 (×4): 40 mg via ORAL
  Filled 2013-06-19 (×5): qty 2

## 2013-06-19 MED ORDER — IRBESARTAN 300 MG PO TABS
300.0000 mg | ORAL_TABLET | Freq: Every day | ORAL | Status: DC
  Filled 2013-06-19: qty 1

## 2013-06-19 MED ORDER — BUDESONIDE-FORMOTEROL FUMARATE 160-4.5 MCG/ACT IN AERO
2.0000 | INHALATION_SPRAY | Freq: Two times a day (BID) | RESPIRATORY_TRACT | Status: DC
  Administered 2013-06-19 – 2013-06-23 (×9): 2 via RESPIRATORY_TRACT
  Filled 2013-06-19: qty 6

## 2013-06-19 MED ORDER — TORSEMIDE 20 MG PO TABS
20.0000 mg | ORAL_TABLET | Freq: Every day | ORAL | Status: DC
  Administered 2013-06-19 – 2013-06-22 (×4): 20 mg via ORAL
  Filled 2013-06-19 (×5): qty 1

## 2013-06-19 MED ORDER — ALPRAZOLAM 0.25 MG PO TABS
0.5000 mg | ORAL_TABLET | Freq: Every evening | ORAL | Status: DC | PRN
  Administered 2013-06-19 – 2013-06-21 (×3): 0.5 mg via ORAL
  Filled 2013-06-19 (×3): qty 2

## 2013-06-19 MED ORDER — TRAMADOL HCL 50 MG PO TABS: 50.0000 mg | ORAL_TABLET | Freq: Four times a day (QID) | ORAL | Status: DC | PRN

## 2013-06-19 MED ORDER — PANTOPRAZOLE SODIUM 40 MG PO TBEC
40.0000 mg | DELAYED_RELEASE_TABLET | Freq: Every day | ORAL | Status: DC
  Administered 2013-06-21 – 2013-06-24 (×3): 40 mg via ORAL
  Filled 2013-06-19 (×3): qty 1

## 2013-06-19 MED ORDER — INSULIN GLARGINE 100 UNIT/ML ~~LOC~~ SOLN
60.0000 [IU] | Freq: Every day | SUBCUTANEOUS | Status: DC
  Administered 2013-06-19 – 2013-06-22 (×4): 60 [IU] via SUBCUTANEOUS
  Filled 2013-06-19 (×5): qty 0.6

## 2013-06-19 MED ORDER — METOPROLOL TARTRATE 25 MG PO TABS
25.0000 mg | ORAL_TABLET | Freq: Every day | ORAL | Status: DC
  Administered 2013-06-19 – 2013-06-24 (×6): 25 mg via ORAL
  Filled 2013-06-19 (×7): qty 1

## 2013-06-19 MED ORDER — TORSEMIDE 20 MG PO TABS: 20.0000 mg | ORAL_TABLET | Freq: Two times a day (BID) | ORAL | Status: DC

## 2013-06-19 NOTE — Progress Notes (Signed)
Subjective:  Still with chest tightness.    Objective: Filed Vitals:   06/18/13 2239 06/19/13 0042 06/19/13 0149 06/19/13 0515  BP: 164/79 146/72 151/79 123/75  Pulse: 81 80 82 73  Temp: 98.1 F (36.7 C)  98.1 F (36.7 C) 97.8 F (36.6 C)  TempSrc: Oral  Oral Oral  Resp: 20 23 20 18   Height:   6\' 1"  (1.854 m)   Weight:   257 lb 11.5 oz (116.9 kg) 255 lb 4.7 oz (115.8 kg)  SpO2: 96% 96% 99% 100%   Weight change:   Intake/Output Summary (Last 24 hours) at 06/19/13 1023 Last data filed at 06/19/13 0930  Gross per 24 hour  Intake      0 ml  Output    750 ml  Net   -750 ml    General: Alert, awake, oriented x3, in no acute distress Neck:  Neck is full Heart: Regular rate and rhythm, without murmurs, rubs, gallops.  Lungs: Clear to auscultation.  No rales or wheezes. Exemities:  1 to 2+ edema.   Neuro: Grossly intact, nonfocal.  Tele:  SR Lab Results: Results for orders placed during the hospital encounter of 06/18/13 (from the past 24 hour(s))  CBC WITH DIFFERENTIAL     Status: Abnormal   Collection Time    06/18/13  9:28 PM      Result Value Range   WBC 7.4  4.0 - 10.5 K/uL   RBC 3.64 (*) 4.22 - 5.81 MIL/uL   Hemoglobin 11.0 (*) 13.0 - 17.0 g/dL   HCT 31.2 (*) 39.0 - 52.0 %   MCV 85.7  78.0 - 100.0 fL   MCH 30.2  26.0 - 34.0 pg   MCHC 35.3  30.0 - 36.0 g/dL   RDW 13.7  11.5 - 15.5 %   Platelets 174  150 - 400 K/uL   Neutrophils Relative % 61  43 - 77 %   Neutro Abs 4.5  1.7 - 7.7 K/uL   Lymphocytes Relative 30  12 - 46 %   Lymphs Abs 2.2  0.7 - 4.0 K/uL   Monocytes Relative 7  3 - 12 %   Monocytes Absolute 0.6  0.1 - 1.0 K/uL   Eosinophils Relative 2  0 - 5 %   Eosinophils Absolute 0.1  0.0 - 0.7 K/uL   Basophils Relative 0  0 - 1 %   Basophils Absolute 0.0  0.0 - 0.1 K/uL  COMPREHENSIVE METABOLIC PANEL     Status: Abnormal   Collection Time    06/18/13  9:28 PM      Result Value Range   Sodium 132 (*) 135 - 145 mEq/L   Potassium 4.6  3.5 - 5.1 mEq/L   Chloride 97  96 - 112 mEq/L   CO2 24  19 - 32 mEq/L   Glucose, Bld 227 (*) 70 - 99 mg/dL   BUN 50 (*) 6 - 23 mg/dL   Creatinine, Ser 1.65 (*) 0.50 - 1.35 mg/dL   Calcium 9.5  8.4 - 10.5 mg/dL   Total Protein 7.1  6.0 - 8.3 g/dL   Albumin 3.5  3.5 - 5.2 g/dL   AST 14  0 - 37 U/L   ALT 13  0 - 53 U/L   Alkaline Phosphatase 90  39 - 117 U/L   Total Bilirubin 0.3  0.3 - 1.2 mg/dL   GFR calc non Af Amer 45 (*) >90 mL/min   GFR calc Af Amer 52 (*) >90 mL/min  TROPONIN I     Status: None   Collection Time    06/18/13  9:28 PM      Result Value Range   Troponin I <0.30  <0.30 ng/mL  PRO B NATRIURETIC PEPTIDE     Status: Abnormal   Collection Time    06/18/13  9:28 PM      Result Value Range   Pro B Natriuretic peptide (BNP) 186.1 (*) 0 - 125 pg/mL  TROPONIN I     Status: None   Collection Time    06/19/13  5:45 AM      Result Value Range   Troponin I <0.30  <0.30 ng/mL  GLUCOSE, CAPILLARY     Status: Abnormal   Collection Time    06/19/13  6:35 AM      Result Value Range   Glucose-Capillary 168 (*) 70 - 99 mg/dL    Studies/Results: @RISRSLT24 @  Medications:Reviewed   @PROBHOSP @  1.  CP/Pressure  Trop negative  Echo today shows large pericardial effusion  Does not meet full echo criteria for tamponade.  Will need to be followed.  May need repeat.   Etiology not clear.  Note thatt he says he has had this chronic tightness for awhile (a few months) with LE edema.   ?post MI  I am not convinced this started soon after January  Was doing OK by clinic notes in April   Appears to have started later. CT of abdomen is neg for mass  Plan:  TSH normal  Check ANA, ESR, TB,  May need BX if negative.   Begin cochicine  Watch for increased diarrhea  Patient has noticed alternating constipation/diarrhea.  Do not want indocin since he is Effient and ASA.    2.  CAD  I am not convinced presentatiion represents angina. Continue meds  3.  Renal  Cr this am has bumped  Had CT last night   Hold Torsemide.  I would give a little IV fluid now since he has been NPO  Check in am  4/  Anemia   WIll need to follow  5.  HL  Trig are high, not hugely  Continue meds.    LOS: 1 day   Dorris Carnes 06/19/2013, 10:23 AM

## 2013-06-19 NOTE — Care Management Note (Addendum)
    Page 1 of 1   06/26/2013     2:54:07 PM   CARE MANAGEMENT NOTE 06/26/2013  Patient:  Eugene [name], Eugene [name]   Account Number:  [id number]  Date Initiated:  06/19/2013  Documentation initiated by:  Lillian Tigges  Subjective/Objective Assessment:   PT ADM ON 06/18/13 WITH CHEST PRESSURE, PERICARDIAL EFFUSION.  PTA, PT INDEPENDENT, LIVES WITH SPOUSE.     Action/Plan:   WILL FOLLOW FOR DISCHARGE NEEDS AS PT PROGRESSES.   Anticipated DC Date:  06/20/2013   Anticipated DC Plan:  [place]  CM consult      Choice offered to / List presented to:             Status of service:  Completed, signed off Medicare Important Message given?   (If response is "NO", the following Medicare IM given date fields will be blank) Date Medicare IM given:   Date Additional Medicare IM given:    Discharge Disposition:  HOME/SELF CARE  Per UR Regulation:  Reviewed for med. necessity/level of care/duration of stay  If discussed at [place] of Stay Meetings, dates discussed:    Comments:  06/26/13 Sahas Sluka,RN,BSN [id number] PT [place].  DENIES DC NEEDS.  06/23/13 Beryle Zeitz AEMRSON,RN,BSN [id number] TO OR TODAY FOR PERICARDIAL WINDOW.

## 2013-06-19 NOTE — Progress Notes (Signed)
UR Completed.  Vergie Living [id number] 06/19/2013

## 2013-06-19 NOTE — Progress Notes (Signed)
Administered tuberculin injection to [name] in his left forearm at 2300 this evening.  Marked the site and also filled out the sticker with date/time/site and placed sticker on the front of the shadow chart.  Test scheduled to be read in 48hrs.  Will continue to monitor.  Eugene [name]

## 2013-06-19 NOTE — Progress Notes (Signed)
  Echocardiogram 2D Echocardiogram has been performed.  Eugene [name] Eugene [name] 06/19/2013, 2:43 PM

## 2013-06-19 NOTE — H&P (Signed)
Admission History and Physical   Patient ID: Eugene [name], MRN: [id number], DOB: 1956/06/05 57 y.o. Date of Encounter: 06/19/2013, 12:15 AM  Primary Physician: [name], MD Primary Cardiologist: R. Lattie Haw, MD Primary Pulmonologist: Susann Givens, MD  Chief Complaint: chest pressure  History of Present Illness: Eugene [name] is a 57 y.o. male with, IDDM,  CAD s/p DES to RCA x2 in 05/2011, and RCA STEMI in 08/2012 treated with DES, hospitalization complicated by acute renal failure and newly depressed EF to 45% on cath, resolved on discharge echo.  He presented to [facility] ED with gradually left sided chest pressure that he describes as feeling like "something is going to bust out" of the lower left side of his chest.  The feeling is continuous, no worse with position or exertion. Also with worsened SOB and cough, and decreased appetite.  Denies fevers/chills/orthopnea/PND.  No palpitations.  Stable LE edema.  No change in bowel patterns.  Not physically active due to LE neuropathy.   He has been adherent to his medication regimen, and his weights at home have been stable within about 5lbs.  At [facility] ED, CT A/P was ordered given ?LUQ abdominal pressure vs. Chest pressure, which showed no abdominal abnormalies but noted a moderate pericardial effusion.  No pulmonary edema on CXR.  Vitals stable, and labs notable for a mildly elevated BNP and creatinine increase to 1.6 from baseline ~1.2.  Transferred to [facility] for further care.   Past Medical History  Diagnosis Date  . Arteriosclerotic cardiovascular disease (ASCVD)     a. 05/2011 Cath/PCI: LM nl, LAD 69m, D1 small, D2 small 33m, LCX large 40p, RCA 50-60p, 99 hazy @ origin of PDA with 70-80 in PDA (2.5x26 Resolute Integrity & 3.0x15 Resolute Integrity DES).;  b. 08/2012 Inflat  STEMI/Cath/PCI: LM minor irregs, LAD 50p, D1 50, LCX nl, OM1 25, RCA 30-40p, 100d (treated with 2.75x39mm Promus Premier DES);  c. 08/2012 Echo: EF 55-60%,  basal inferopost HK.  [name] Hyperlipidemia   . Diabetes mellitus     Peripheral neuropathy  . Bell palsy   . Hypertension   . COPD (chronic obstructive pulmonary disease)   . Sleep apnea   . Gallstones   . Cholelithiasis 07/2012    Asymptomatic; identified incidentally  . PONV (postoperative nausea and vomiting)   . Anxiety   . C. difficile colitis     a. 08/2012  . Myocardial infarct 09/08/12  . Nephrolithiasis   . Contrast dye induced nephropathy     a. 08/2012 post cath/pci  . CHF (congestive heart failure)   . Asthma   . Heart disease      Past Surgical History  Procedure Laterality Date  . Circumcision    . Stents    . Esophagogastroduodenoscopy      in [place] over 20 yrs ago  . Colonoscopy      In [facility], approximately 2011 per patient, was normal. Advised to come back in 10 years.  . Esophagogastroduodenoscopy  06/12/2012    Procedure: ESOPHAGOGASTRODUODENOSCOPY (EGD);  Surgeon: Daneil Dolin, MD;  Location: [facility];  Service: Endoscopy;  Laterality: N/A;  9:45  . Cardiac catheterization        Current Facility-Administered Medications  Medication Dose Route Frequency Provider Last Rate Last Dose  . HYDROmorphone (DILAUDID) injection 1 mg  1 mg Intravenous Once Maudry Diego, MD       Current Outpatient Prescriptions  Medication Sig Dispense Refill  . ALPRAZolam (XANAX) 0.5  MG tablet Take one half to one tablet by mouth at bedtime as needed for sleep.  30 tablet  2  . amLODipine (NORVASC) 5 MG tablet Take 1 tablet (5 mg total) by mouth daily.  30 tablet  0  . aspirin EC 81 MG tablet Take 81 mg by mouth at bedtime.       . budesonide-formoterol (SYMBICORT) 160-4.5 MCG/ACT inhaler Inhale 2 puffs into the lungs 2 (two) times daily.      [name] doxazosin (CARDURA) 4 MG tablet Take 1 tablet (4 mg total) by mouth at bedtime.  30 tablet  11  . fenofibrate 160 MG tablet Take 160 mg by mouth at bedtime.      . insulin glargine (LANTUS) 100 UNIT/ML  injection Inject 0.8 mLs (80 Units total) into the skin at bedtime.  10 mL  2  . metoprolol tartrate (LOPRESSOR) 25 MG tablet Take 25-50 mg by mouth at bedtime.      . nitroGLYCERIN (NITROSTAT) 0.4 MG SL tablet Place 1 tablet (0.4 mg total) under the tongue every 5 (five) minutes x 3 doses as needed for chest pain.  25 tablet  3  . pantoprazole (PROTONIX) 40 MG tablet Take 40-80 mg by mouth at bedtime.       . prasugrel (EFFIENT) 10 MG TABS tablet Take 10 mg by mouth at bedtime.      . torsemide (DEMADEX) 20 MG tablet Take 20-40 mg by mouth 2 (two) times daily. Two tablets in the morning and one tablet at bedtime      . traMADol (ULTRAM) 50 MG tablet Take 1 tablet (50 mg total) by mouth every 6 (six) hours as needed for pain.  24 tablet  2  . valsartan (DIOVAN) 320 MG tablet Take 320 mg by mouth at bedtime.          Allergies: Allergies  Allergen Reactions  . Hydrocodone Nausea And Vomiting  . Lisinopril Cough  . Neurontin [Gabapentin]     Side effects, suicidal thoughts  . Statins Other (See Comments)    Muscle aches     Social History:  The patient  reports that he has never smoked. He does not have any smokeless tobacco history on file. He reports that he does not drink alcohol or use illicit drugs.   Family History:  The patient's family history includes Diabetes in his brother, brother, mother, and sister; Heart attack in his mother; Hypertension in his brother and brother; Sleep apnea in his sister; Stroke in his mother. There is no history of Colon cancer or Liver disease.   ROS:  Please see the history of present illness.   All other systems reviewed and negative.   Vital Signs: Blood pressure 151/79, pulse 82, temperature 98.1 F (36.7 C), temperature source Oral, resp. rate 20, height 6\' 1"  (1.854 m), weight 116.9 kg (257 lb 11.5 oz), SpO2 99.00%.  PHYSICAL EXAM: General:  Well nourished, well developed, in no acute distress HEENT: normal Lymph: no adenopathy Neck: JVP  ~8cm with HJR Endocrine:  No thryomegaly Vascular: No carotid bruits; FA pulses 2+ bilaterally without bruits Cardiac:  normal S1, S2; RRR; no murmur.  Palpation reproduces symptoms Lungs:  clear to auscultation bilaterally, no wheezing, rhonchi or rales Abd: soft, nontender, no hepatomegaly Ext: 2+ pitting edema L LE, 1+ pitting edema R LE Musculoskeletal:  No deformities, BUE and BLE strength normal and equal Skin: warm and dry Neuro:  CNs 2-12 intact, no focal abnormalities noted Psych:  Normal affect  EKG:  Unchanged from prior in September, incomplete RBBB, borderline inferior q waves, LA enlargement.  Labs:   Lab Results  Component Value Date   WBC 7.4 06/18/2013   HGB 11.0* 06/18/2013   HCT 31.2* 06/18/2013   MCV 85.7 06/18/2013   PLT 174 06/18/2013    Recent Labs Lab 06/18/13 2128  NA 132*  K 4.6  CL 97  CO2 24  BUN 50*  CREATININE 1.65*  CALCIUM 9.5  PROT 7.1  BILITOT 0.3  ALKPHOS 90  ALT 13  AST 14  GLUCOSE 227*    Recent Labs  06/18/13 2128  TROPONINI <0.30   Lab Results  Component Value Date   CHOL 241* 05/11/2013   HDL 35* 05/11/2013   LDLCALC Comment:   Not calculated due to Triglyceride >400. Suggest ordering Direct LDL (Unit Code: [phone]).   Total Cholesterol/HDL Ratio:CHD Risk                        Coronary Heart Disease Risk Table                                        Men       Women          1/2 Average Risk              3.4        3.3              Average Risk              5.0        4.4           2X Average Risk              9.6        7.1           3X Average Risk             23.4       11.0 Use the calculated Patient Ratio above and the CHD Risk table  to determine the patient's CHD Risk. ATP III Classification (LDL):       < 100        mg/dL         Optimal      100 - 129     mg/dL         Near or Above Optimal      130 - 159     mg/dL         Borderline High      160 - 189     mg/dL         High       > 190        mg/dL         Very High    05/11/2013   TRIG 621* 05/11/2013   Lab Results  Component Value Date   DDIMER 0.33 04/26/2013   BNP (last 3 results)  Recent Labs  02/11/13 1402 04/27/13 0358 06/18/13 2128  PROBNP 205.6* 269.6* 186.1*     Radiology/Studies:  Ct Abdomen Pelvis Wo Contrast  06/18/2013   CLINICAL DATA:  Abdominal pain  EXAM: CT ABDOMEN AND PELVIS WITHOUT CONTRAST  TECHNIQUE: Multidetector CT imaging of the abdomen and pelvis was performed following the standard protocol without intravenous contrast.  COMPARISON:  CT 06/01/2012  FINDINGS: Lung bases are clear. Moderately large pericardial effusion measuring 21 mm in thickness. Coronary artery calcification.  Liver gallbladder and bile ducts are normal. Pancreas and spleen are normal. Kidneys show no obstruction mass or calculus.  Negative for bowel obstruction or bowel thickening. Mild sigmoid diverticulosis. Negative for mass or adenopathy. Mild prostate enlargement. Mild lumbar degenerative changes.  IMPRESSION: Moderately large pericardial effusion.  No acute abnormality in the abdomen.   Electronically Signed   By: Franchot Gallo M.D.   On: 06/18/2013 23:12   Dg Chest 2 View  06/18/2013   CLINICAL DATA:  Shortness of breath. Abdominal pain.  EXAM: CHEST  2 VIEW  COMPARISON:  04/26/2013  FINDINGS: Shallow inspiration with vascular crowding bases. The heart size and mediastinal contours are within normal limits. Both lungs are clear. The visualized skeletal structures are unremarkable.  IMPRESSION: No active cardiopulmonary disease.   Electronically Signed   By: Lucienne Capers M.D.   On: 06/18/2013 23:15     ASSESSMENT AND PLAN:   1. Chest Pressure: - Atypical chest pressure, multiple ED visits for chest pressure/SOB noted. - reproduces symptoms with palpation c/w costochondritis, although dull pressure quality of pain not typical for this either. - Appears only slightly hypervolemic, with a BNP only mildly elevated and lower than prior. - Will  continue home torsemide, tramadol, and hypertension regimen.   Add tylenol, avoid NSAIDs due to AKI - Continue asa, prasugrel  2.  Pericardial Effusion on CT - Is not hemodynamically significant, no change in EKG voltage from prior. - Chest wall echo ordered for the morning.  3.  AKI - high BUN:Cr ratio, although appears mildly hypervolemic on exam - Continue diuretic, hold ARB, recheck in the morning.  - He denies taking any NSAIDs at home.  4. IDDM - 3/4ths dose home Lantus and SSI  5. Hyponatremia -  Runs slightly low chronically.  May be hypovolemia, although exam does not appear hypovolemic.  Recheck in AM, consider holding AM lasix based on labs/echo.   Signed,  Stephani Police, MD 06/19/2013, 3:43 AM

## 2013-06-20 ENCOUNTER — Encounter (HOSPITAL_COMMUNITY): Payer: Self-pay | Admitting: Interventional Cardiology

## 2013-06-20 DIAGNOSIS — I252 Old myocardial infarction: Secondary | ICD-10-CM

## 2013-06-20 DIAGNOSIS — N179 Acute kidney failure, unspecified: Secondary | ICD-10-CM

## 2013-06-20 DIAGNOSIS — I319 Disease of pericardium, unspecified: Secondary | ICD-10-CM

## 2013-06-20 LAB — BASIC METABOLIC PANEL
BUN: 50 mg/dL — ABNORMAL HIGH (ref 6–23)
Calcium: 8.9 mg/dL (ref 8.4–10.5)
GFR calc Af Amer: 37 mL/min — ABNORMAL LOW (ref >90)
GFR calc non Af Amer: 32 mL/min — ABNORMAL LOW (ref >90)
Glucose, Bld: 292 mg/dL — ABNORMAL HIGH (ref 70–99)
Potassium: 4.5 mEq/L (ref 3.5–5.1)
Sodium: 132 mEq/L — ABNORMAL LOW (ref 135–145)

## 2013-06-20 LAB — GLUCOSE, CAPILLARY: Glucose-Capillary: 222 mg/dL — ABNORMAL HIGH (ref 70–99)

## 2013-06-20 NOTE — Progress Notes (Signed)
Subjective:  He has some exertional SHOB and some mild chest tightness.  No worse than yesterday.  He thinks it has been going on for about 4 months.    Objective: Filed Vitals:   06/19/13 2006 06/20/13 0500 06/20/13 0614 06/20/13 0807  BP: 129/74  139/77   Pulse: 87  70   Temp: 98.8 F (37.1 C)  98.1 F (36.7 C)   TempSrc: Oral  Oral   Resp: 18  18   Height:      Weight:  254 lb 6.4 oz (115.395 kg)    SpO2: 98%  96% 97%   Weight change: 2 lb 6.4 oz (1.089 kg)  Intake/Output Summary (Last 24 hours) at 06/20/13 1059 Last data filed at 06/20/13 0900  Gross per 24 hour  Intake    640 ml  Output    940 ml  Net   -300 ml    General: Alert, awake, oriented x3, in no acute distress Neck:  Neck is full Heart: Regular rate and rhythm, without murmurs, rubs, gallops.  Lungs: Clear to auscultation.  No rales or wheezes. Exemities:  1 + edema bilaterally in lower extremities  Neuro: Grossly intact, nonfocal.  Tele:  SR Lab Results: Results for orders placed during the hospital encounter of 06/18/13 (from the past 24 hour(s))  GLUCOSE, CAPILLARY     Status: Abnormal   Collection Time    06/19/13 11:17 AM      Result Value Range   Glucose-Capillary 171 (*) 70 - 99 mg/dL  GLUCOSE, CAPILLARY     Status: Abnormal   Collection Time    06/19/13  4:19 PM      Result Value Range   Glucose-Capillary 138 (*) 70 - 99 mg/dL  GLUCOSE, CAPILLARY     Status: Abnormal   Collection Time    06/19/13 10:04 PM      Result Value Range   Glucose-Capillary 268 (*) 70 - 99 mg/dL  PRO B NATRIURETIC PEPTIDE     Status: None   Collection Time    06/19/13 10:18 PM      Result Value Range   Pro B Natriuretic peptide (BNP) 118.6  0 - 125 pg/mL  BASIC METABOLIC PANEL     Status: Abnormal   Collection Time    06/20/13  4:41 AM      Result Value Range   Sodium 132 (*) 135 - 145 mEq/L   Potassium 4.5  3.5 - 5.1 mEq/L   Chloride 96  96 - 112 mEq/L   CO2 24  19 - 32 mEq/L   Glucose, Bld 292 (*) 70 -  99 mg/dL   BUN 50 (*) 6 - 23 mg/dL   Creatinine, Ser 2.18 (*) 0.50 - 1.35 mg/dL   Calcium 8.9  8.4 - 10.5 mg/dL   GFR calc non Af Amer 32 (*) >90 mL/min   GFR calc Af Amer 37 (*) >90 mL/min    Studies/Results: @RISRSLT24 @  Medications:Reviewed   @PROBHOSP @  1.  CP/Pressure  Trop negative  Echo yesterday showed large pericardial effusion  Did not meet full echo criteria for tamponade.  Will need to be followed.  May need repeat.   Etiology not clear.  Note thatt he says he has had this chronic tightness for awhile (a few months) with LE edema.   I do not think this started soon after January or represents a Dresslers syndrome. Was doing OK by clinic notes in April   Appears to have started later. CT  of abdomen is neg for mass  Plan:  TSH normal  Check ANA, ESR, TB,  May need BX if negative.   Begin cochicine  Watch for increased diarrhea  Patient has noticed alternating constipation/diarrhea.  Hold on NSAIDs since his Cr bumped. Could consider prednisone.  2.  CAD  I am not convinced presentatiion represents angina. Continue meds  3.  Renal  Cr this am has bumped  Had CT last night  Hold Torsemide.  I would give a little IV fluid now since he has been NPO  Check in am  4/  Anemia  Mild, WIll need to follow  5.  HL  Trig are high.  Continue meds.    LOS: 2 days   Eugene [name] S. 06/20/2013, 10:59 AM

## 2013-06-21 DIAGNOSIS — I509 Heart failure, unspecified: Secondary | ICD-10-CM

## 2013-06-21 DIAGNOSIS — I5021 Acute systolic (congestive) heart failure: Secondary | ICD-10-CM

## 2013-06-21 LAB — BASIC METABOLIC PANEL
CO2: 23 mEq/L (ref 19–32)
Chloride: 98 mEq/L (ref 96–112)
Creatinine, Ser: 1.58 mg/dL — ABNORMAL HIGH (ref 0.50–1.35)
GFR calc Af Amer: 55 mL/min — ABNORMAL LOW (ref >90)
Potassium: 4.6 mEq/L (ref 3.5–5.1)

## 2013-06-21 LAB — CBC
HCT: 30.9 % — ABNORMAL LOW (ref 39.0–52.0)
Hemoglobin: 10.9 g/dL — ABNORMAL LOW (ref 13.0–17.0)
MCV: 85.1 fL (ref 78.0–100.0)
RBC: 3.63 MIL/uL — ABNORMAL LOW (ref 4.22–5.81)
RDW: 13.4 % (ref 11.5–15.5)
WBC: 6.2 10*3/uL (ref 4.0–10.5)

## 2013-06-21 LAB — GLUCOSE, CAPILLARY
Glucose-Capillary: 185 mg/dL — ABNORMAL HIGH (ref 70–99)
Glucose-Capillary: 231 mg/dL — ABNORMAL HIGH (ref 70–99)

## 2013-06-21 MED ORDER — INSULIN ASPART 100 UNIT/ML ~~LOC~~ SOLN
0.0000 [IU] | Freq: Three times a day (TID) | SUBCUTANEOUS | Status: DC
Start: 2013-06-22 — End: 2013-06-26
  Administered 2013-06-22 (×2): 15 [IU] via SUBCUTANEOUS
  Administered 2013-06-22: 20 [IU] via SUBCUTANEOUS
  Administered 2013-06-23: 4 [IU] via SUBCUTANEOUS
  Administered 2013-06-24: 11 [IU] via SUBCUTANEOUS
  Administered 2013-06-24 – 2013-06-26 (×5): 4 [IU] via SUBCUTANEOUS

## 2013-06-21 MED ORDER — INSULIN ASPART 100 UNIT/ML ~~LOC~~ SOLN
0.0000 [IU] | Freq: Every day | SUBCUTANEOUS | Status: DC
  Administered 2013-06-21: 5 [IU] via SUBCUTANEOUS
  Administered 2013-06-22: 2 [IU] via SUBCUTANEOUS

## 2013-06-21 MED ORDER — DEXAMETHASONE SODIUM PHOSPHATE 4 MG/ML IJ SOLN
4.0000 mg | Freq: Two times a day (BID) | INTRAMUSCULAR | Status: DC
  Administered 2013-06-21 (×2): 4 mg via INTRAVENOUS
  Filled 2013-06-21 (×4): qty 1

## 2013-06-21 NOTE — Progress Notes (Signed)
       Patient Name: Eugene [name] Date of Encounter: 06/21/2013    SUBJECTIVE:Feels tight in chest. Worse than yesterday but not greatly different than several week history of tightness.Had MI in January 2014.  TELEMETRY:  NSR: Filed Vitals:   06/20/13 1312 06/20/13 1947 06/21/13 0335 06/21/13 0746  BP: 142/75 151/67 116/71   Pulse: 84 87 69   Temp: 98.2 F (36.8 C) 98.1 F (36.7 C) 97.9 F (36.6 C)   TempSrc: Oral Oral Oral   Resp: 18 18 18    Height:      Weight:   252 lb 11.2 oz (114.624 kg)   SpO2: 100% 98% 99% 99%    Intake/Output Summary (Last 24 hours) at 06/21/13 1021 Last data filed at 06/21/13 0844  Gross per 24 hour  Intake   1080 ml  Output   2350 ml  Net  -1270 ml    LABS: Basic Metabolic Panel:  Recent Labs  06/20/13 0441 06/21/13 0545  NA 132* 135  K 4.5 4.6  CL 96 98  CO2 24 23  GLUCOSE 292* 231*  BUN 50* 48*  CREATININE 2.18* 1.58*  CALCIUM 8.9 9.1   CBC:  Recent Labs  06/18/13 2128 06/21/13 0545  WBC 7.4 6.2  NEUTROABS 4.5  --   HGB 11.0* 10.9*  HCT 31.2* 30.9*  MCV 85.7 85.1  PLT 174 150   Cardiac Enzymes:  Recent Labs  06/18/13 2128 06/19/13 0545  TROPONINI <0.30 <0.30    Radiology/Studies:  NAD  Physical Exam: Blood pressure 116/71, pulse 69, temperature 97.9 F (36.6 C), temperature source Oral, resp. rate 18, height 6\' 1"  (1.854 m), weight 252 lb 11.2 oz (114.624 kg), SpO2 99.00%. Weight change: -1 lb 11.2 oz (-0.771 kg)  No pulsus paradoxus. Chest decreased breath sounds at the bases. Cardiac with normal heart tone. Unable to see neck veins 2+ bilateral edema.  ASSESSMENT:  1. Large pericardial effusion by echo with symptoms of dyspnea. No clinical evidence of tamponade. Etiology could be Dressler's or Viral . Other possibilities include cancer, TB, Thyroid. Doubt pyogenic in absence of toxicity. 2. CAD with prior MI 2014, January treated with DES distal RCA 3. Acute on chronic kidney  injury  Plan:  1. NSAIDS not a good idea given renal function 2. Repeat echo in AM 3. Start steroid as anti-inflammatory 4. May need pericardial window for diagnosis and symptom relief.  Demetrios Isaacs 06/21/2013, 10:21 AM

## 2013-06-21 NOTE — Progress Notes (Signed)
Patient,s CBG 426. Dr Wynonia Lawman paged and orders given to change pt to resistant SSI with HS coverage.

## 2013-06-22 ENCOUNTER — Inpatient Hospital Stay (HOSPITAL_COMMUNITY): Payer: PRIVATE HEALTH INSURANCE

## 2013-06-22 DIAGNOSIS — I319 Disease of pericardium, unspecified: Secondary | ICD-10-CM

## 2013-06-22 DIAGNOSIS — I309 Acute pericarditis, unspecified: Secondary | ICD-10-CM

## 2013-06-22 LAB — CBC
HCT: 33.2 % — ABNORMAL LOW (ref 39.0–52.0)
Hemoglobin: 12 g/dL — ABNORMAL LOW (ref 13.0–17.0)
MCH: 30.4 pg (ref 26.0–34.0)
MCHC: 36.1 g/dL — ABNORMAL HIGH (ref 30.0–36.0)
MCV: 84.1 fL (ref 78.0–100.0)
Platelets: 206 10*3/uL (ref 150–400)
RBC: 3.95 MIL/uL — ABNORMAL LOW (ref 4.22–5.81)
RDW: 13.3 % (ref 11.5–15.5)
WBC: 11.3 10*3/uL — ABNORMAL HIGH (ref 4.0–10.5)

## 2013-06-22 LAB — SURGICAL PCR SCREEN
MRSA, PCR: NEGATIVE
Staphylococcus aureus: POSITIVE — AB

## 2013-06-22 LAB — BLOOD GAS, ARTERIAL
Acid-Base Excess: 0.5 mmol/L (ref 0.0–2.0)
Bicarbonate: 24.3 mEq/L — ABNORMAL HIGH (ref 20.0–24.0)
Drawn by: 40530
FIO2: 0.21 %
O2 Saturation: 97.1 %
Patient temperature: 98.6
TCO2: 25.5 mmol/L (ref 0–100)
pCO2 arterial: 37.7 mmHg (ref 35.0–45.0)
pH, Arterial: 7.426 (ref 7.350–7.450)
pO2, Arterial: 80.3 mmHg (ref 80.0–100.0)

## 2013-06-22 LAB — URINE MICROSCOPIC-ADD ON

## 2013-06-22 LAB — URINALYSIS, ROUTINE W REFLEX MICROSCOPIC
Bilirubin Urine: NEGATIVE
Glucose, UA: 250 mg/dL — AB
Ketones, ur: NEGATIVE mg/dL
Leukocytes, UA: NEGATIVE
Nitrite: NEGATIVE
Protein, ur: 100 mg/dL — AB
Specific Gravity, Urine: 1.014 (ref 1.005–1.030)
Urobilinogen, UA: 0.2 mg/dL (ref 0.0–1.0)
pH: 5 (ref 5.0–8.0)

## 2013-06-22 LAB — COMPREHENSIVE METABOLIC PANEL
ALT: 12 U/L (ref 0–53)
AST: 14 U/L (ref 0–37)
Albumin: 3.5 g/dL (ref 3.5–5.2)
Alkaline Phosphatase: 99 U/L (ref 39–117)
BUN: 55 mg/dL — ABNORMAL HIGH (ref 6–23)
CO2: 24 mEq/L (ref 19–32)
Calcium: 9.2 mg/dL (ref 8.4–10.5)
Chloride: 90 mEq/L — ABNORMAL LOW (ref 96–112)
Creatinine, Ser: 1.59 mg/dL — ABNORMAL HIGH (ref 0.50–1.35)
GFR calc Af Amer: 54 mL/min — ABNORMAL LOW (ref >90)
GFR calc non Af Amer: 47 mL/min — ABNORMAL LOW (ref >90)
Glucose, Bld: 343 mg/dL — ABNORMAL HIGH (ref 70–99)
Potassium: 4.3 mEq/L (ref 3.5–5.1)
Sodium: 129 mEq/L — ABNORMAL LOW (ref 135–145)
Total Bilirubin: 0.2 mg/dL — ABNORMAL LOW (ref 0.3–1.2)
Total Protein: 7.4 g/dL (ref 6.0–8.3)

## 2013-06-22 LAB — BASIC METABOLIC PANEL
BUN: 52 mg/dL — ABNORMAL HIGH (ref 6–23)
Calcium: 9.4 mg/dL (ref 8.4–10.5)
Chloride: 94 mEq/L — ABNORMAL LOW (ref 96–112)
Creatinine, Ser: 1.38 mg/dL — ABNORMAL HIGH (ref 0.50–1.35)
GFR calc Af Amer: 65 mL/min — ABNORMAL LOW (ref >90)
GFR calc non Af Amer: 56 mL/min — ABNORMAL LOW (ref >90)
Sodium: 133 mEq/L — ABNORMAL LOW (ref 135–145)

## 2013-06-22 LAB — PLATELET INHIBITION P2Y12: Platelet Function  P2Y12: 329 [PRU] (ref 194–418)

## 2013-06-22 LAB — TYPE AND SCREEN
ABO/RH(D): O POS
Antibody Screen: NEGATIVE

## 2013-06-22 LAB — GLUCOSE, RANDOM: Glucose, Bld: 452 mg/dL — ABNORMAL HIGH (ref 70–99)

## 2013-06-22 LAB — GLUCOSE, CAPILLARY
Glucose-Capillary: 347 mg/dL — ABNORMAL HIGH (ref 70–99)
Glucose-Capillary: 237 mg/dL — ABNORMAL HIGH (ref 70–99)

## 2013-06-22 LAB — PROTIME-INR
INR: 1 (ref 0.00–1.49)
Prothrombin Time: 13 seconds (ref 11.6–15.2)

## 2013-06-22 LAB — APTT: aPTT: 27 seconds (ref 24–37)

## 2013-06-22 MED ORDER — CHLORHEXIDINE GLUCONATE CLOTH 2 % EX PADS
6.0000 | MEDICATED_PAD | Freq: Every day | CUTANEOUS | Status: DC
  Administered 2013-06-22 – 2013-06-23 (×2): 6 via TOPICAL

## 2013-06-22 MED ORDER — MUPIROCIN 2 % EX OINT
1.0000 "application " | TOPICAL_OINTMENT | Freq: Two times a day (BID) | CUTANEOUS | Status: DC
  Administered 2013-06-22 – 2013-06-26 (×8): 1 via NASAL
  Filled 2013-06-22: qty 22

## 2013-06-22 MED ORDER — DEXTROSE 5 % IV SOLN
1.5000 g | INTRAVENOUS | Status: DC
  Filled 2013-06-22 (×2): qty 1.5

## 2013-06-22 NOTE — Progress Notes (Signed)
Inpatient Diabetes Program Recommendations  AACE/ADA: New Consensus Statement on Inpatient Glycemic Control (2013)  Target Ranges:  Prepandial:   less than 140 mg/dL      Peak postprandial:   less than 180 mg/dL (1-2 hours)      Critically ill patients:  140 - 180 mg/dL  Results for Eugene [name], Eugene [name] (MRN [id number]) as of 06/22/2013 14:10  Ref. Range 06/21/2013 06:27 06/21/2013 11:23 06/21/2013 22:00 06/22/2013 06:09 06/22/2013 11:05  Glucose-Capillary Latest Range: 70-99 mg/dL 185 (H) 231 (H) 426 (H) 310 (H) 369 (H)   Inpatient Diabetes Program Recommendations Insulin - Basal: consider increasing Lantus to home dose 80 units  Insulin - Meal Coverage: consider adding Novolog 6 units TID with meals  Thank you  Raoul Pitch BSN, RN,CDE Inpatient Diabetes Coordinator [phone] (team pager)

## 2013-06-22 NOTE — Progress Notes (Signed)
Subjective: Still with tightness in chest.  Not dizzye  Breathing is OK Objective: Filed Vitals:   06/21/13 0746 06/21/13 1442 06/21/13 2133 06/22/13 0432  BP:  132/77 164/82 149/77  Pulse:  83 104 79  Temp:  98.3 F (36.8 C) 98 F (36.7 C) 98 F (36.7 C)  TempSrc:  Oral Oral Oral  Resp:  18 18 19   Height:      Weight:    252 lb (114.306 kg)  SpO2: 99% 100% 99% 96%   Weight change: -11.2 oz (-0.318 kg)  Intake/Output Summary (Last 24 hours) at 06/22/13 0732 Last data filed at 06/22/13 0500  Gross per 24 hour  Intake    600 ml  Output   1650 ml  Net  -1050 ml    General: Alert, awake, oriented x3, in no acute distress Neck:  JVP is normal Heart: Regular rate and rhythm, without murmurs, rubs, gallops.  Lungs: Clear to auscultation.  No rales or wheezes. Exemities:  No edema.   Neuro: Grossly intact, nonfocal.  Tele:  SR  80s  Lab Results: Results for orders placed during the hospital encounter of 06/18/13 (from the past 24 hour(s))  GLUCOSE, CAPILLARY     Status: Abnormal   Collection Time    06/21/13 11:23 AM      Result Value Range   Glucose-Capillary 231 (*) 70 - 99 mg/dL  GLUCOSE, CAPILLARY     Status: Abnormal   Collection Time    06/21/13 10:00 PM      Result Value Range   Glucose-Capillary 426 (*) 70 - 99 mg/dL  GLUCOSE, RANDOM     Status: Abnormal   Collection Time    06/21/13 11:09 PM      Result Value Range   Glucose, Bld 452 (*) 70 - 99 mg/dL  BASIC METABOLIC PANEL     Status: Abnormal   Collection Time    06/22/13  3:42 AM      Result Value Range   Sodium 133 (*) 135 - 145 mEq/L   Potassium 4.6  3.5 - 5.1 mEq/L   Chloride 94 (*) 96 - 112 mEq/L   CO2 25  19 - 32 mEq/L   Glucose, Bld 412 (*) 70 - 99 mg/dL   BUN 52 (*) 6 - 23 mg/dL   Creatinine, Ser 1.38 (*) 0.50 - 1.35 mg/dL   Calcium 9.4  8.4 - 10.5 mg/dL   GFR calc non Af Amer 56 (*) >90 mL/min   GFR calc Af Amer 65 (*) >90 mL/min  GLUCOSE, CAPILLARY     Status: Abnormal   Collection Time     06/22/13  6:09 AM      Result Value Range   Glucose-Capillary 310 (*) 70 - 99 mg/dL   Comment 1 Documented in Chart     Comment 2 Notify RN      Studies/Results: @RISRSLT24 @  Medications: Reviewed   @PROBHOSP @ 1.  Pericardial effusion.  So far etiology is not clear.  ESR pending but TB neg, TSH normal.  Hemodynamics stable.  No evid for tamponade Will get echo today to reeval size.  Discuss with surgery possible window for Dx and to relieve. Continue colchicine.  I would d/c steroids.    2/  Renal  Cr improved  Follow.   3,  CAD  Remains symptom free.    LOS: 4 days   Dorris Carnes 06/22/2013, 7:32 AM

## 2013-06-22 NOTE — Consult Note (Addendum)
[address]       [place] [address]             [phone]        Eugene [name] Medical Record [id number] Date of Birth: 12/23/1955  Referring: Dr Dorris Carnes Primary Care: [name], MD Pulmonary Dr Luan Pulling Chief Complaint:    Chief Complaint  Patient presents with  . Shortness of Breath  . Abdominal Pain    History of Present Illness:     Admitted from ER annie Pen with pericardial effusion.  Patient is a 57 y.o. male with, IDDM, CAD s/p DES to RCA x2 in 05/2011, and RCA STEMI in 08/2012 treated with DES, hospitalization complicated by acute renal failure and newly depressed EF to 45% on cath, resolved on discharge echo.  He has had 3 month history of increasing SOB and pressure feeling in chest. Because or symptoms getting worse he went to ER . He describes as feeling like "something is going to bust out" of the lower left side of his chest. The feeling is continuous, no worse with position or exertion. Also with worsened SOB and cough, and decreased appetite. Denies fevers/chills/orthopnea/PND. No palpitations. Stable LE edema. No change in bowel patterns. Not physically active due to LE neuropathy. He has been adherent to his medication regimen on Effient. At [facility] ED, CT A/P was ordered done LUQ abdominal pressure vs. Chest pressure, which showed no abdominal abnormalies but noted a moderate pericardial effusion. No pulmonary edema on CXR. Vitals stable, and labs notable for a mildly elevated BNP and creatinine increase to 1.6 from baseline ~1.2.  ECHO's done the 24 and 27, unchanged with at least moderate pericardial effusion     Current Activity/ Functional Status: Patient is independent with mobility/ambulation, transfers, ADL's, IADL's.   Zubrod Score: At the time of surgery this patient's most appropriate activity status/level should be described as: []  Normal activity, no symptoms [x]  Symptoms, fully  ambulatory []  Symptoms, in bed less than or equal to 50% of the time []  Symptoms, in bed greater than 50% of the time but less than 100% []  Bedridden []  Moribund  Past Medical History  Diagnosis Date  . Arteriosclerotic cardiovascular disease (ASCVD)     a. 05/2011 Cath/PCI: LM nl, LAD 49m, D1 small, D2 small 60m, LCX large 40p, RCA 50-60p, 99 hazy @ origin of PDA with 70-80 in PDA (2.5x26 Resolute Integrity & 3.0x15 Resolute Integrity DES).;  b. 08/2012 Inflat  STEMI/Cath/PCI: LM minor irregs, LAD 50p, D1 50, LCX nl, OM1 25, RCA 30-40p, 100d (treated with 2.75x56mm Promus Premier DES);  c. 08/2012 Echo: EF 55-60%, basal inferopost HK.  [name] Hyperlipidemia   . Diabetes mellitus     Peripheral neuropathy  . Bell palsy   . Hypertension   . COPD (chronic obstructive pulmonary disease)   . Sleep apnea   . Gallstones   . Cholelithiasis 07/2012    Asymptomatic; identified incidentally  . PONV (postoperative nausea and vomiting)   . Anxiety   . C. difficile colitis     a. 08/2012  . Myocardial infarct 09/08/12  . Nephrolithiasis   . Contrast dye induced nephropathy     a. 08/2012 post cath/pci  . CHF (congestive heart failure)   . Asthma   . Heart disease   . Old myocardial infarction     Past Surgical History  Procedure Laterality Date  . Circumcision    . Stents    .  Esophagogastroduodenoscopy      in [place] over 20 yrs ago  . Colonoscopy      In [facility], approximately 2011 per patient, was normal. Advised to come back in 10 years.  . Esophagogastroduodenoscopy  06/12/2012    Procedure: ESOPHAGOGASTRODUODENOSCOPY (EGD);  Surgeon: Daneil Dolin, MD;  Location: [facility];  Service: Endoscopy;  Laterality: N/A;  9:45  . Cardiac catheterization      History  Smoking status  . Never Smoker   Smokeless tobacco  . Not on file    History  Alcohol Use No    Comment: heavy etoh use 30 years ago    History   Social History  . Marital Status: Married     Spouse Name: N/A    Number of Children: 2  . Years of Education: N/A   Occupational History  . 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 Smoking status: Never Smoker   . Smokeless tobacco: Not on file  . Alcohol Use: No     Comment: heavy etoh use 30 years ago  . Drug Use: No  . 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 Disabled at this point.    Allergies  Allergen Reactions  . Hydrocodone Nausea And Vomiting  . Lisinopril Cough  . Neurontin [Gabapentin]     Side effects, suicidal thoughts  . Statins Other (See Comments)    Muscle aches    Current Facility-Administered Medications  Medication Dose Route Frequency Provider Last Rate Last Dose  . 0.9 %  sodium chloride infusion  250 mL Intravenous PRN Fay Records, MD      . acetaminophen (TYLENOL) tablet 650 mg  650 mg Oral Q4H PRN Stephani Police, MD      . ALPRAZolam Duanne Moron) tablet 0.5 mg  0.5 mg Oral QHS PRN Stephani Police, MD   0.5 mg at 06/21/13 2221  . amLODipine (NORVASC) tablet 5 mg  5 mg Oral Daily Stephani Police, MD   5 mg at 06/22/13 1009  . budesonide-formoterol (SYMBICORT) 160-4.5 MCG/ACT inhaler 2 puff  2 puff Inhalation BID Stephani Police, MD   2 puff at 06/21/13 1951  . cefUROXime (ZINACEF) 1.5 g in dextrose 5 % 50 mL IVPB  1.5 g Intravenous 60 min Pre-Op Grace Isaac, MD      . colchicine tablet 0.6 mg  0.6 mg Oral BID Fay Records, MD   0.6 mg at 06/22/13 1011  . doxazosin (CARDURA) tablet 4 mg  4 mg Oral QHS Stephani Police, MD   4 mg at 06/21/13 2221  . fenofibrate tablet 160 mg  160 mg Oral QHS Stephani Police, MD   160 mg at 06/21/13 2221  . insulin aspart (novoLOG) injection 0-20 Units  0-20 Units Subcutaneous TID WC Larey Dresser, MD   15 Units at 06/22/13 1630  . insulin aspart (novoLOG) injection 0-5 Units  0-5 Units Subcutaneous QHS Larey Dresser,  MD   5 Units at 06/21/13 2223  . insulin glargine (LANTUS) injection 60 Units  60 Units Subcutaneous QHS Stephani Police, MD   60 Units at 06/21/13 2222  . metoprolol tartrate (LOPRESSOR) tablet 25 mg  25 mg Oral QHS Stephani Police, MD   25 mg at 06/21/13 2221  . ondansetron (ZOFRAN) injection 4  mg  4 mg Intravenous Q6H PRN Stephani Police, MD      . pantoprazole (PROTONIX) EC tablet 40 mg  40 mg Oral QHS Stephani Police, MD   40 mg at 06/21/13 2226  . sodium chloride 0.9 % injection 3 mL  3 mL Intravenous Q12H Stephani Police, MD   3 mL at 06/22/13 1009  . sodium chloride 0.9 % injection 3 mL  3 mL Intravenous PRN Stephani Police, MD      . torsemide [facility]) tablet 20 mg  20 mg Oral QHS Larey Dresser, MD   20 mg at 06/21/13 2221  . torsemide (DEMADEX) tablet 40 mg  40 mg Oral Daily Larey Dresser, MD   40 mg at 06/22/13 1010  . traMADol (ULTRAM) tablet 50 mg  50 mg Oral Q6H PRN Stephani Police, MD        Prescriptions prior to admission  Medication Sig Dispense Refill  . ALPRAZolam (XANAX) 0.5 MG tablet Take one half to one tablet by mouth at bedtime as needed for sleep.  30 tablet  2  . amLODipine (NORVASC) 5 MG tablet Take 1 tablet (5 mg total) by mouth daily.  30 tablet  0  . aspirin EC 81 MG tablet Take 81 mg by mouth at bedtime.       . budesonide-formoterol (SYMBICORT) 160-4.5 MCG/ACT inhaler Inhale 2 puffs into the lungs 2 (two) times daily.      [name] doxazosin (CARDURA) 4 MG tablet Take 1 tablet (4 mg total) by mouth at bedtime.  30 tablet  11  . fenofibrate 160 MG tablet Take 160 mg by mouth at bedtime.      . insulin glargine (LANTUS) 100 UNIT/ML injection Inject 0.8 mLs (80 Units total) into the skin at bedtime.  10 mL  2  . metoprolol tartrate (LOPRESSOR) 25 MG tablet Take 25 mg by mouth at bedtime.      . nitroGLYCERIN (NITROSTAT) 0.4 MG SL tablet Place 1 tablet (0.4 mg total) under the tongue every 5 (five) minutes x 3 doses as needed for chest pain.  25 tablet  3  . pantoprazole (PROTONIX) 40 MG  tablet Take 40 mg by mouth at bedtime.      . prasugrel (EFFIENT) 10 MG TABS tablet Take 10 mg by mouth at bedtime.      . torsemide (DEMADEX) 20 MG tablet Take 20-40 mg by mouth 2 (two) times daily. Two tablets in the morning and one tablet at bedtime      . traMADol (ULTRAM) 50 MG tablet Take 1 tablet (50 mg total) by mouth every 6 (six) hours as needed for pain.  24 tablet  2  . valsartan (DIOVAN) 320 MG tablet Take 320 mg by mouth at bedtime.        Family History  Problem Relation Age of Onset  . Diabetes Mother   . Heart attack Mother   . Stroke Mother   . Diabetes Sister   . Sleep apnea Sister   . Hypertension Brother   . Diabetes Brother   . Colon cancer Neg Hx   . Liver disease Neg Hx   . Diabetes Brother   . Hypertension Brother      Review of Systems:     Cardiac Review of Systems: Y or N  Chest Pain [  y  ]  Resting SOB [ y  ] Exertional SOB  [ y ]  Vertell Limber Blue.Reese  ]   Pedal Edema [ y  ]  Palpitations [ n ] Syncope  [n  ]   Presyncope [ n  ]  General Review of Systems: [Y] = yes [  ]=no Constitional: recent weight change [ y ]; anorexia [  ]; fatigue [  ]; nausea [  ]; night sweats [ n ]; fever [n  ]; or chills [n ]                                                               Dental: poor dentition[  ]; Last Dentist visit:   Eye : blurred vision [  ]; diplopia [   ]; vision changes [  ];  Amaurosis fugax[  ]; Resp: cough [  ];  wheezing[  ];  hemoptysis[ n ]; shortness of breath[y  ]; paroxysmal nocturnal dyspnea[ y ]; dyspnea on exertion[ y ]; or orthopnea[  ];  GI:  gallstones[  ], vomiting[  ];  dysphagia[  ]; melena[  ];  hematochezia [  ]; heartburn[  ];   Hx of  Colonoscopy[y  ]; GU: kidney stones [  ]; hematuria[  ];   dysuria [  ];  nocturia[  ];  history of     obstruction [  ]; urinary frequency [  ]             Skin: rash, swelling[  ];, hair loss[  ];  peripheral edema[ y ];  or itching[  ]; Musculosketetal: myalgias[  ];  joint swelling[  ];  joint  erythema[  ];  joint pain[  ];  back pain[  ];  Heme/Lymph: bruising[  ];  bleeding[  ];  anemia[  ];  Neuro: TIA[  ];  headaches[  ];  stroke[  ];  vertigo[  ];  seizures[  ];   paresthesias[ below knees bilateria ];  difficulty walking[y  ];  Psych:depression[  ]; anxiety[  ];  Endocrine: diabetes[  y];  thyroid dysfunction[  ];  Immunizations: Flu [  Yes ]; Pneumococcal[  Not recorded  ];  Other:  Physical Exam: BP 159/73  Pulse 98  Temp(Src) 98 F (36.7 C) (Oral)  Resp 19  Ht 6\' 1"  (1.854 m)  Wt 252 lb (114.306 kg)  BMI 33.25 kg/m2  SpO2 99%  General appearance: alert, cooperative, appears older than stated age, mild distress and moderately obese Neurologic: intact Heart: regular rate and rhythm, S1, S2 normal, no murmur, click, rub or gallop and no rub Lungs: diminished breath sounds bibasilar Abdomen: soft, non-tender; bowel sounds normal; no masses,  no organomegaly Extremities: edema left ankle more then right  2+ Wound: no jvd at 45 dgrees  no adenopathy neck axillary or supraclivacular   Diagnostic Studies & Laboratory data:     Recent Radiology Findings:  Ct Abdomen Pelvis Wo Contrast  06/18/2013   CLINICAL DATA:  Abdominal pain  EXAM: CT ABDOMEN AND PELVIS WITHOUT CONTRAST  TECHNIQUE: Multidetector CT imaging of the abdomen and pelvis was performed following the standard protocol without intravenous contrast.  COMPARISON:  CT 06/01/2012  FINDINGS: Lung bases are clear. Moderately large pericardial effusion measuring 21 mm in thickness. Coronary artery calcification.  Liver gallbladder and bile ducts are normal. Pancreas and spleen are normal. Kidneys show no obstruction mass or calculus.  Negative for bowel obstruction or  bowel thickening. Mild sigmoid diverticulosis. Negative for mass or adenopathy. Mild prostate enlargement. Mild lumbar degenerative changes.  IMPRESSION: Moderately large pericardial effusion.  No acute abnormality in the abdomen.   Electronically  Signed   By: Franchot Gallo M.D.   On: 06/18/2013 23:12   Dg Chest 2 View  06/18/2013   CLINICAL DATA:  Shortness of breath. Abdominal pain.  EXAM: CHEST  2 VIEW  COMPARISON:  04/26/2013  FINDINGS: Shallow inspiration with vascular crowding bases. The heart size and mediastinal contours are within normal limits. Both lungs are clear. The visualized skeletal structures are unremarkable.  IMPRESSION: No active cardiopulmonary disease.   Electronically Signed   By: Lucienne Capers M.D.   On: 06/18/2013 23:15    Recent Lab Findings: Lab Results  Component Value Date   WBC 11.3* 06/22/2013   HGB 12.0* 06/22/2013   HCT 33.2* 06/22/2013   PLT 206 06/22/2013   GLUCOSE 343* 06/22/2013   CHOL 241* 05/11/2013   TRIG 621* 05/11/2013   HDL 35* 05/11/2013   LDLCALC Comment:   Not calculated due to Triglyceride >400. Suggest ordering Direct LDL (Unit Code: [phone]).   Total Cholesterol/HDL Ratio:CHD Risk                        Coronary Heart Disease Risk Table                                        Men       Women          1/2 Average Risk              3.4        3.3              Average Risk              5.0        4.4           2X Average Risk              9.6        7.1           3X Average Risk             23.4       11.0 Use the calculated Patient Ratio above and the CHD Risk table  to determine the patient's CHD Risk. ATP III Classification (LDL):       < 100        mg/dL         Optimal      100 - 129     mg/dL         Near or Above Optimal      130 - 159     mg/dL         Borderline High      160 - 189     mg/dL         High       > 190        mg/dL         Very High   05/11/2013   ALT 12 06/22/2013   AST 14 06/22/2013   NA 129* 06/22/2013   K 4.3 06/22/2013   CL 90* 06/22/2013   CREATININE 1.59* 06/22/2013   BUN 55* 06/22/2013   CO2  24 06/22/2013   TSH 1.714 05/11/2013   INR 1.00 06/22/2013   HGBA1C TEST NOT PERFORMED 05/11/2013   Chronic Kidney Disease   Stage I     GFR >90  Stage II    GFR  60-89  Stage IIIA GFR 45-59  Stage IIIB GFR 30-44  Stage IV   GFR 15-29  Stage V    GFR  <15  Lab Results  Component Value Date   CREATININE 1.59* 06/22/2013   Estimated Creatinine Clearance: 68.8 ml/min (by C-G formula based on Cr of 1.59).    Assessment / Plan:  Hyperglycemia  Most glucose over 220 and most over 300 since admission with hyponatremia  Cardiology vs medical consult for DM control CAD s/p stent rca on effient DM with complications of neuropathy and ckd stage II Moderate pericardial effusion unknown cause with symptoms CT of Chest- to evaluate the pericardium and r/o poss chest mass Proceed with subxiphoid pericardial window tomorrow The goals risks and alternatives of the planned surgical procedure Pericardial window have been discussed with the patient in detail. The risks of the procedure including death, infection, stroke, myocardial infarction, bleeding, blood transfusion have all been discussed specifically.  I have quoted Eugene [name] a 5 % of perioperative mortality and a complication rate as high as 20 %. The patient's questions have been answered.Eugene [name] is willing  to proceed with the planned procedure.   Grace Isaac MD      [address] [place] [address] Office [phone]   Beeper [phone]  06/22/2013 4:59 PM

## 2013-06-22 NOTE — Progress Notes (Signed)
Dr. Stanford Breed notified of abnormal labs including cbg's, in 300's.  made aware of Dr. Servando Snare concern regarding increase cbg's, pt scheduled for surgery in am  Eugene [name]

## 2013-06-22 NOTE — Progress Notes (Signed)
Dr. Servando Snare notified of abn labs of Na 129, cl 90,- paged cardiology to inform Alfonzo Feller

## 2013-06-22 NOTE — Progress Notes (Signed)
Echocardiogram 2D Echocardiogram limited has been performed.  Eugene [name] 06/22/2013, 9:35 AM

## 2013-06-23 ENCOUNTER — Inpatient Hospital Stay (HOSPITAL_COMMUNITY): Payer: PRIVATE HEALTH INSURANCE

## 2013-06-23 ENCOUNTER — Encounter (HOSPITAL_COMMUNITY)
Admission: EM | Disposition: A | Discharge status: Home / Self Care | Payer: Self-pay | Source: Home / Self Care | Attending: Cardiology

## 2013-06-23 ENCOUNTER — Encounter (HOSPITAL_COMMUNITY): Payer: PRIVATE HEALTH INSURANCE | Admitting: Certified Registered Nurse Anesthetist

## 2013-06-23 ENCOUNTER — Encounter (HOSPITAL_COMMUNITY): Payer: Self-pay | Admitting: Certified Registered Nurse Anesthetist

## 2013-06-23 ENCOUNTER — Inpatient Hospital Stay (HOSPITAL_COMMUNITY): Payer: PRIVATE HEALTH INSURANCE | Admitting: Certified Registered Nurse Anesthetist

## 2013-06-23 HISTORY — PX: INTRAOPERATIVE TRANSESOPHAGEAL ECHOCARDIOGRAM: SHX5062

## 2013-06-23 HISTORY — PX: SUBXYPHOID PERICARDIAL WINDOW: SHX5075

## 2013-06-23 LAB — BODY FLUID CELL COUNT WITH DIFFERENTIAL
Eos, Fluid: 0 %
Lymphs, Fluid: 2 %
Monocyte-Macrophage-Serous Fluid: 98 % — ABNORMAL HIGH (ref 50–90)
Neutrophil Count, Fluid: 0 % (ref 0–25)
Other Cells, Fluid: 0 %

## 2013-06-23 LAB — AMYLASE, BODY FLUID: Amylase, Fluid: 11 U/L

## 2013-06-23 LAB — GLUCOSE, CAPILLARY
Glucose-Capillary: 175 mg/dL — ABNORMAL HIGH (ref 70–99)
Glucose-Capillary: 76 mg/dL (ref 70–99)
Glucose-Capillary: 93 mg/dL (ref 70–99)
Glucose-Capillary: 97 mg/dL (ref 70–99)

## 2013-06-23 LAB — GLUCOSE, SEROUS FLUID: Glucose, Fluid: 232 mg/dL

## 2013-06-23 LAB — LACTATE DEHYDROGENASE, PLEURAL OR PERITONEAL FLUID

## 2013-06-23 SURGERY — CREATION, PERICARDIAL WINDOW, SUBXIPHOID APPROACH
Anesthesia: General | Site: Chest

## 2013-06-23 MED ORDER — ROCURONIUM BROMIDE 100 MG/10ML IV SOLN
INTRAVENOUS | Status: DC | PRN
  Administered 2013-06-23: 20 mg via INTRAVENOUS
  Administered 2013-06-23: 50 mg via INTRAVENOUS

## 2013-06-23 MED ORDER — SODIUM CHLORIDE 0.9 % IJ SOLN: 9.0000 mL | INTRAMUSCULAR | Status: DC | PRN

## 2013-06-23 MED ORDER — PROPOFOL 10 MG/ML IV BOLUS
INTRAVENOUS | Status: DC | PRN
  Administered 2013-06-23: 200 mg via INTRAVENOUS

## 2013-06-23 MED ORDER — FENTANYL CITRATE 0.05 MG/ML IJ SOLN
INTRAMUSCULAR | Status: AC
  Administered 2013-06-23: 50 ug via INTRAVENOUS
  Filled 2013-06-23: qty 2

## 2013-06-23 MED ORDER — OXYCODONE HCL 5 MG PO TABS: 5.0000 mg | ORAL_TABLET | ORAL | Status: DC | PRN

## 2013-06-23 MED ORDER — ONDANSETRON HCL 4 MG/2ML IJ SOLN
INTRAMUSCULAR | Status: DC | PRN
  Administered 2013-06-23: 4 mg via INTRAVENOUS

## 2013-06-23 MED ORDER — GLYCOPYRROLATE 0.2 MG/ML IJ SOLN
INTRAMUSCULAR | Status: DC | PRN
  Administered 2013-06-23: .8 mg via INTRAVENOUS

## 2013-06-23 MED ORDER — DEXTROSE 5 % IV SOLN
1.5000 g | Freq: Two times a day (BID) | INTRAVENOUS | Status: AC
  Administered 2013-06-24: 1.5 g via INTRAVENOUS
  Filled 2013-06-23 (×2): qty 1.5

## 2013-06-23 MED ORDER — ONDANSETRON HCL 4 MG/2ML IJ SOLN
INTRAMUSCULAR | Status: AC
  Filled 2013-06-23: qty 2

## 2013-06-23 MED ORDER — ARTIFICIAL TEARS OP OINT
TOPICAL_OINTMENT | OPHTHALMIC | Status: DC | PRN
  Administered 2013-06-23: 1 via OPHTHALMIC

## 2013-06-23 MED ORDER — POTASSIUM CHLORIDE 10 MEQ/50ML IV SOLN
10.0000 meq | Freq: Every day | INTRAVENOUS | Status: DC | PRN
  Filled 2013-06-23: qty 50

## 2013-06-23 MED ORDER — MIDAZOLAM HCL 2 MG/2ML IJ SOLN
1.0000 mg | INTRAMUSCULAR | Status: DC | PRN
  Administered 2013-06-23: 1 mg via INTRAVENOUS

## 2013-06-23 MED ORDER — TRAMADOL HCL 50 MG PO TABS
50.0000 mg | ORAL_TABLET | Freq: Four times a day (QID) | ORAL | Status: DC | PRN
  Administered 2013-06-24 – 2013-06-25 (×3): 50 mg via ORAL
  Filled 2013-06-23 (×2): qty 1
  Filled 2013-06-23: qty 2

## 2013-06-23 MED ORDER — OXYCODONE-ACETAMINOPHEN 5-325 MG PO TABS: 1.0000 | ORAL_TABLET | ORAL | Status: DC | PRN

## 2013-06-23 MED ORDER — PHENYLEPHRINE HCL 10 MG/ML IJ SOLN
10.0000 mg | INTRAVENOUS | Status: DC | PRN
  Administered 2013-06-23: 25 ug/min via INTRAVENOUS

## 2013-06-23 MED ORDER — HYDROMORPHONE HCL PF 1 MG/ML IJ SOLN
INTRAMUSCULAR | Status: AC
  Administered 2013-06-23 (×2): 0.5 mg
  Filled 2013-06-23: qty 1

## 2013-06-23 MED ORDER — ONDANSETRON HCL 4 MG/2ML IJ SOLN
4.0000 mg | Freq: Four times a day (QID) | INTRAMUSCULAR | Status: DC | PRN
  Administered 2013-06-23: 4 mg via INTRAVENOUS

## 2013-06-23 MED ORDER — ACETAMINOPHEN 160 MG/5ML PO SOLN
1000.0000 mg | Freq: Four times a day (QID) | ORAL | Status: AC
  Administered 2013-06-24 (×2): 1000 mg via ORAL
  Filled 2013-06-23 (×2): qty 40.6

## 2013-06-23 MED ORDER — ACETAMINOPHEN 500 MG PO TABS
1000.0000 mg | ORAL_TABLET | Freq: Four times a day (QID) | ORAL | Status: AC
  Administered 2013-06-24: 1000 mg via ORAL
  Filled 2013-06-23: qty 2

## 2013-06-23 MED ORDER — ONDANSETRON HCL 4 MG/2ML IJ SOLN: 4.0000 mg | Freq: Four times a day (QID) | INTRAMUSCULAR | Status: DC | PRN

## 2013-06-23 MED ORDER — DIPHENHYDRAMINE HCL 50 MG/ML IJ SOLN: 12.5000 mg | Freq: Four times a day (QID) | INTRAMUSCULAR | Status: DC | PRN

## 2013-06-23 MED ORDER — FENTANYL CITRATE 0.05 MG/ML IJ SOLN
50.0000 ug | INTRAMUSCULAR | Status: DC | PRN
  Administered 2013-06-23: 50 ug via INTRAVENOUS

## 2013-06-23 MED ORDER — FENTANYL 10 MCG/ML IV SOLN
INTRAVENOUS | Status: DC
  Administered 2013-06-23: 20 ug via INTRAVENOUS
  Administered 2013-06-23: 10 ug via INTRAVENOUS
  Administered 2013-06-24: 50 ug via INTRAVENOUS
  Administered 2013-06-24: 10 ug via INTRAVENOUS
  Administered 2013-06-24: 28.03 ug via INTRAVENOUS
  Filled 2013-06-23: qty 50

## 2013-06-23 MED ORDER — MIDAZOLAM HCL 2 MG/2ML IJ SOLN
INTRAMUSCULAR | Status: AC
  Administered 2013-06-23: 1 mg via INTRAVENOUS
  Filled 2013-06-23: qty 2

## 2013-06-23 MED ORDER — INSULIN GLARGINE 100 UNIT/ML ~~LOC~~ SOLN
30.0000 [IU] | Freq: Every day | SUBCUTANEOUS | Status: DC
  Administered 2013-06-23 – 2013-06-25 (×3): 30 [IU] via SUBCUTANEOUS
  Filled 2013-06-23 (×4): qty 0.3

## 2013-06-23 MED ORDER — DEXTROSE 5 % IV SOLN
1.5000 g | INTRAVENOUS | Status: DC | PRN
  Administered 2013-06-23: 1.5 g via INTRAVENOUS

## 2013-06-23 MED ORDER — METOCLOPRAMIDE HCL 5 MG/ML IJ SOLN
10.0000 mg | Freq: Four times a day (QID) | INTRAMUSCULAR | Status: AC
  Administered 2013-06-23 – 2013-06-24 (×4): 10 mg via INTRAVENOUS
  Filled 2013-06-23 (×4): qty 2

## 2013-06-23 MED ORDER — SENNOSIDES-DOCUSATE SODIUM 8.6-50 MG PO TABS
1.0000 | ORAL_TABLET | Freq: Every evening | ORAL | Status: DC | PRN
  Filled 2013-06-23: qty 1

## 2013-06-23 MED ORDER — LACTATED RINGERS IV SOLN
INTRAVENOUS | Status: DC
  Administered 2013-06-23: 13:00:00 via INTRAVENOUS

## 2013-06-23 MED ORDER — DEXTROSE-NACL 5-0.9 % IV SOLN
INTRAVENOUS | Status: DC
  Administered 2013-06-23 – 2013-06-24 (×2): via INTRAVENOUS

## 2013-06-23 MED ORDER — NALOXONE HCL 0.4 MG/ML IJ SOLN: 0.4000 mg | INTRAMUSCULAR | Status: DC | PRN

## 2013-06-23 MED ORDER — BISACODYL 5 MG PO TBEC
10.0000 mg | DELAYED_RELEASE_TABLET | Freq: Every day | ORAL | Status: DC
  Administered 2013-06-25: 10 mg via ORAL
  Filled 2013-06-23 (×2): qty 2

## 2013-06-23 MED ORDER — LACTATED RINGERS IV SOLN
INTRAVENOUS | Status: DC | PRN
  Administered 2013-06-23: 14:00:00 via INTRAVENOUS

## 2013-06-23 MED ORDER — LABETALOL HCL 5 MG/ML IV SOLN
INTRAVENOUS | Status: AC
  Administered 2013-06-23: 5 mg
  Filled 2013-06-23: qty 4

## 2013-06-23 MED ORDER — PROMETHAZINE HCL 25 MG/ML IJ SOLN
6.2500 mg | Freq: Four times a day (QID) | INTRAMUSCULAR | Status: DC | PRN
  Administered 2013-06-24 (×2): 6.25 mg via INTRAVENOUS
  Filled 2013-06-23 (×2): qty 1

## 2013-06-23 MED ORDER — NEOSTIGMINE METHYLSULFATE 1 MG/ML IJ SOLN
INTRAMUSCULAR | Status: DC | PRN
  Administered 2013-06-23: 5 mg via INTRAVENOUS

## 2013-06-23 MED ORDER — FENTANYL CITRATE 0.05 MG/ML IJ SOLN
INTRAMUSCULAR | Status: DC | PRN
  Administered 2013-06-23: 100 ug via INTRAVENOUS
  Administered 2013-06-23 (×2): 50 ug via INTRAVENOUS

## 2013-06-23 MED ORDER — DIPHENHYDRAMINE HCL 12.5 MG/5ML PO ELIX
12.5000 mg | ORAL_SOLUTION | Freq: Four times a day (QID) | ORAL | Status: DC | PRN
  Filled 2013-06-23: qty 5

## 2013-06-23 SURGICAL SUPPLY — 41 items
BENZOIN TINCTURE PRP APPL 2/3 (GAUZE/BANDAGES/DRESSINGS) IMPLANT
CANISTER SUCTION 2500CC (MISCELLANEOUS) ×3 IMPLANT
CATH THORACIC 28FR (CATHETERS) IMPLANT
CATH THORACIC 28FR RT ANG (CATHETERS) IMPLANT
CATH THORACIC 36FR (CATHETERS) IMPLANT
CATH THORACIC 36FR RT ANG (CATHETERS) IMPLANT
CONN ST 1/4X3/8  BEN (MISCELLANEOUS) ×1
CONN ST 1/4X3/8 BEN (MISCELLANEOUS) ×2 IMPLANT
CONT SPEC 4OZ CLIKSEAL STRL BL (MISCELLANEOUS) ×6 IMPLANT
COVER SURGICAL LIGHT HANDLE (MISCELLANEOUS) ×3 IMPLANT
DERMABOND ADVANCED (GAUZE/BANDAGES/DRESSINGS) ×1
DERMABOND ADVANCED .7 DNX12 (GAUZE/BANDAGES/DRESSINGS) ×2 IMPLANT
DRAIN CHANNEL 28F RND 3/8 FF (WOUND CARE) ×3 IMPLANT
DRAPE LAPAROSCOPIC ABDOMINAL (DRAPES) ×3 IMPLANT
DRSG AQUACEL AG ADV 3.5X14 (GAUZE/BANDAGES/DRESSINGS) ×3 IMPLANT
ELECT REM PT RETURN 9FT ADLT (ELECTROSURGICAL) ×3
ELECTRODE REM PT RTRN 9FT ADLT (ELECTROSURGICAL) ×2 IMPLANT
GLOVE BIO SURGEON STRL SZ 6.5 (GLOVE) ×6 IMPLANT
HEMOSTAT POWDER SURGIFOAM 1G (HEMOSTASIS) IMPLANT
KIT BASIN OR (CUSTOM PROCEDURE TRAY) ×3 IMPLANT
KIT ROOM TURNOVER OR (KITS) ×3 IMPLANT
NS IRRIG 1000ML POUR BTL (IV SOLUTION) ×6 IMPLANT
PACK CHEST (CUSTOM PROCEDURE TRAY) ×3 IMPLANT
PAD ARMBOARD 7.5X6 YLW CONV (MISCELLANEOUS) ×6 IMPLANT
PAD ELECT DEFIB RADIOL ZOLL (MISCELLANEOUS) ×3 IMPLANT
SPONGE GAUZE 4X4 12PLY (GAUZE/BANDAGES/DRESSINGS) ×3 IMPLANT
STRIP CLOSURE SKIN 1/2X4 (GAUZE/BANDAGES/DRESSINGS) ×3 IMPLANT
SUT VIC AB 0 CTX 18 (SUTURE) ×3 IMPLANT
SUT VIC AB 2-0 CTX 27 (SUTURE) ×3 IMPLANT
SUT VIC AB 3-0 X1 27 (SUTURE) ×3 IMPLANT
SWAB COLLECTION DEVICE MRSA (MISCELLANEOUS) IMPLANT
SYR 50ML SLIP (SYRINGE) IMPLANT
SYRINGE 10CC LL (SYRINGE) IMPLANT
SYSTEM SAHARA CHEST DRAIN ATS (WOUND CARE) ×3 IMPLANT
TAPE CLOTH SOFT 2X10 (GAUZE/BANDAGES/DRESSINGS) ×3 IMPLANT
TOWEL OR 17X24 6PK STRL BLUE (TOWEL DISPOSABLE) ×3 IMPLANT
TOWEL OR 17X26 10 PK STRL BLUE (TOWEL DISPOSABLE) ×3 IMPLANT
TRAP SPECIMEN MUCOUS 40CC (MISCELLANEOUS) ×9 IMPLANT
TRAY FOLEY CATH 14FRSI W/METER (CATHETERS) ×3 IMPLANT
TUBE ANAEROBIC SPECIMEN COL (MISCELLANEOUS) IMPLANT
WATER STERILE IRR 1000ML POUR (IV SOLUTION) ×6 IMPLANT

## 2013-06-23 NOTE — Anesthesia Procedure Notes (Signed)
Procedure Name: Intubation Date/Time: 06/23/2013 2:09 PM Performed by: Trixie Deis A Pre-anesthesia Checklist: Patient identified, Timeout performed, Emergency Drugs available, Suction available and Patient being monitored Patient Re-evaluated:Patient Re-evaluated prior to inductionOxygen Delivery Method: Circle system utilized Preoxygenation: Pre-oxygenation with 100% oxygen Intubation Type: IV induction Ventilation: Mask ventilation with difficulty, Two handed mask ventilation required and Oral airway inserted - appropriate to patient size Laryngoscope Size: Sabra Heck and 2 Grade View: Grade II Tube type: Oral Tube size: 8.0 mm Number of attempts: 2 (DLx1 SRNA, DLx1 CRNA) Airway Equipment and Method: Stylet Placement Confirmation: ETT inserted through vocal cords under direct vision,  breath sounds checked- equal and bilateral and positive ETCO2 Secured at: 23 cm Tube secured with: Tape Dental Injury: Teeth and Oropharynx as per pre-operative assessment  Difficulty Due To: Difficulty was anticipated, Difficult Airway- due to large tongue and Difficult Airway- due to reduced neck mobility Comments: Difficult mask due to presence of facial hair

## 2013-06-23 NOTE — Brief Op Note (Addendum)
      [address]       [place] [address]             [phone]     06/23/2013  PATIENT:  Eugene [name]  57 y.o. male  PRE-OPERATIVE DIAGNOSIS:  PERICARDIAL EFFUSION  POST-OPERATIVE DIAGNOSIS:  PERICARDIAL EFFUSION/ non bloody effusion 400 ml  PROCEDURE:  Procedure(s): SUBXYPHOID PERICARDIAL WINDOW INTRAOPERATIVE TRANSESOPHAGEAL ECHOCARDIOGRAM  SURGEON:  Surgeon(s): Grace Isaac, MD Grace Isaac, MD  PHYSICIAN ASSISTANT: WAYNE GOLD PA-C  ANESTHESIA:   general  PATIENT CONDITION:  PACU - hemodynamically stable.  PRE-OPERATIVE WEIGHT: [id number]  COMPLICATIONS: NO KNOWN  SPECIMEN: CULTURES, CYTOLOGY

## 2013-06-23 NOTE — Progress Notes (Addendum)
Echocardiogram OR TEE has been performed.  Eugene [name] 06/23/2013, 2:57 PM

## 2013-06-23 NOTE — Progress Notes (Signed)
Patient ID: Eugene [name], male   DOB: Aug 14, 1956, 57 y.o.   MRN: [id number] Subjective: Still with fullness in splenic area  Objective: Filed Vitals:   06/22/13 1331 06/22/13 2000 06/22/13 2118 06/23/13 0509  BP: 159/73 170/83  106/71  Pulse: 98 83  63  Temp:  97.8 F (36.6 C)  97.9 F (36.6 C)  TempSrc:  Oral  Oral  Resp:  18  18  Height:      Weight:    252 lb 3.3 oz (114.4 kg)  SpO2: 99% 100% 99% 91%   Weight change: 3.3 oz (0.094 kg)  Intake/Output Summary (Last 24 hours) at 06/23/13 0816 Last data filed at 06/23/13 0634  Gross per 24 hour  Intake    360 ml  Output    825 ml  Net   -465 ml    General: Alert, awake, oriented x3, in no acute distress Neck:  JVP is normal Heart: Regular rate and rhythm, without murmurs, rubs, gallops.  Lungs: Clear to auscultation.  No rales or wheezes. Exemities:  No edema.   Neuro: Grossly intact, nonfocal.  Tele:  SR  80s no arrhythmia  06/23/2013  BMET    Component Value Date/Time   NA 129* 06/22/2013 1505   K 4.3 06/22/2013 1505   CL 90* 06/22/2013 1505   CO2 24 06/22/2013 1505   GLUCOSE 343* 06/22/2013 1505   BUN 55* 06/22/2013 1505   CREATININE 1.59* 06/22/2013 1505   CREATININE 1.24 05/11/2013 1320   CREATININE 1.24 05/11/2013 1320   CALCIUM 9.2 06/22/2013 1505   GFRNONAA 47* 06/22/2013 1505   GFRAA 54* 06/22/2013 1505       Medications: Reviewed   1.  Pericardial Effusion:  For window today around 11:00 with Dr Ezekiel Slocumb see that ESR ever done  Continue colchicine  Will need to be on high dose ASA post window if NSAI's not being used due to elevated Cr. 2/  Renal  Cr improved  Follow.   3,  CAD  Remains symptom free.  Enzymes negative   LOS: 5 days   Jenkins Rouge 06/23/2013, 8:16 AM

## 2013-06-23 NOTE — Anesthesia Preprocedure Evaluation (Signed)
Anesthesia Evaluation    Reviewed: Allergy & Precautions, H&P , NPO status   History of Anesthesia Complications (+) PONV and history of anesthetic complications  Airway       Dental   Pulmonary sleep apnea and Continuous Positive Airway Pressure Ventilation , COPD COPD inhaler,          Cardiovascular hypertension, + CAD, + Past MI, + Cardiac Stents and +CHF  Echo: EF 55-60%, basal inferopost HK   Neuro/Psych PSYCHIATRIC DISORDERS Anxiety negative neurological ROS     GI/Hepatic Neg liver ROS, GERD-  Medicated,  Endo/Other  diabetes, Insulin Dependent  Renal/GU Renal InsufficiencyRenal disease     Musculoskeletal   Abdominal   Peds  Hematology   Anesthesia Other Findings   Reproductive/Obstetrics                           Anesthesia Physical Anesthesia Plan  ASA: III  Anesthesia Plan: General   Post-op Pain Management:    Induction: Intravenous  Airway Management Planned: Oral ETT  Additional Equipment: Arterial line, CVP and 3D TEE  Intra-op Plan:   Post-operative Plan: Possible Post-op intubation/ventilation  Informed Consent:   Plan Discussed with: CRNA, Anesthesiologist and Surgeon  Anesthesia Plan Comments:         Anesthesia Quick Evaluation

## 2013-06-23 NOTE — Anesthesia Postprocedure Evaluation (Signed)
  Anesthesia Post-op Note  Patient: Eugene [name]  Procedure(s) Performed: Procedure(s): SUBXYPHOID PERICARDIAL WINDOW (N/A) INTRAOPERATIVE TRANSESOPHAGEAL ECHOCARDIOGRAM (N/A)  Patient Location: PACU  Anesthesia Type:General  Level of Consciousness: awake, alert  and oriented  Airway and Oxygen Therapy: Patient Spontanous Breathing and Patient connected to nasal cannula oxygen  Post-op Pain: mild  Post-op Assessment: Post-op Vital signs reviewed, Patient's Cardiovascular Status Stable, Respiratory Function Stable, Patent Airway and Pain level controlled  Post-op Vital Signs: stable  Complications: No apparent anesthesia complications

## 2013-06-23 NOTE — Progress Notes (Signed)
Patient ID: Eugene [name], male   DOB: [birth year], 57 y.o.   MRN: [id number]   SICU Evening Rounds:   Hemodynamically stable  Nausea despite Zofran. Will give some Reglan and phenergan prn.  Urine output good  CT output low  CBC    Component Value Date/Time   WBC 11.3* 06/22/2013 1505   RBC 3.95* 06/22/2013 1505   RBC 3.01* 09/13/2012 0920   HGB 12.0* 06/22/2013 1505   HCT 33.2* 06/22/2013 1505   PLT 206 06/22/2013 1505   MCV 84.1 06/22/2013 1505   MCH 30.4 06/22/2013 1505   MCHC 36.1* 06/22/2013 1505   RDW 13.3 06/22/2013 1505   LYMPHSABS 2.2 06/18/2013 2128   MONOABS 0.6 06/18/2013 2128   EOSABS 0.1 06/18/2013 2128   BASOSABS 0.0 06/18/2013 2128     BMET    Component Value Date/Time   NA 129* 06/22/2013 1505   K 4.3 06/22/2013 1505   CL 90* 06/22/2013 1505   CO2 24 06/22/2013 1505   GLUCOSE 343* 06/22/2013 1505   BUN 55* 06/22/2013 1505   CREATININE 1.59* 06/22/2013 1505   CREATININE 1.24 05/11/2013 1320   CREATININE 1.24 05/11/2013 1320   CALCIUM 9.2 06/22/2013 1505   GFRNONAA 47* 06/22/2013 1505   GFRAA 54* 06/22/2013 1505     A/P:  Stable postop course. Continue current plans

## 2013-06-23 NOTE — Progress Notes (Signed)
Inpatient Diabetes Program Recommendations  AACE/ADA: New Consensus Statement on Inpatient Glycemic Control (2013)  Target Ranges:  Prepandial:   less than 140 mg/dL      Peak postprandial:   less than 180 mg/dL (1-2 hours)      Critically ill patients:  140 - 180 mg/dL  Results for MADDOCK, BROSNAHAN (MRN [id number]) as of 06/23/2013 10:14  Ref. Range 06/22/2013 06:09 06/22/2013 11:05 06/22/2013 16:06 06/22/2013 21:02 06/23/2013 06:33  Glucose-Capillary Latest Range: 70-99 mg/dL 310 (H) 369 (H) 347 (H) 237 (H) 175 (H)   Fasting CBG much improved since discontinuation of steroid.   May still benefit from addition of Novolog prandial coverage TID if post prandial CBGs still elevated. Will follow. Thank you  Raoul Pitch BSN, RN,CDE Inpatient Diabetes Coordinator [phone] (team pager)

## 2013-06-23 NOTE — Transfer of Care (Signed)
Immediate Anesthesia Transfer of Care Note  Patient: Eugene [name]  Procedure(s) Performed: Procedure(s): SUBXYPHOID PERICARDIAL WINDOW (N/A) INTRAOPERATIVE TRANSESOPHAGEAL ECHOCARDIOGRAM (N/A)  Patient Location: PACU  Anesthesia Type:General  Level of Consciousness: awake, alert  and oriented  Airway & Oxygen Therapy: Patient Spontanous Breathing and Patient connected to nasal cannula oxygen  Post-op Assessment: Report given to PACU RN, Post -op Vital signs reviewed and stable and Patient moving all extremities  Post vital signs: Reviewed and stable  Complications: No apparent anesthesia complications

## 2013-06-24 ENCOUNTER — Encounter (HOSPITAL_COMMUNITY): Payer: Self-pay | Admitting: Cardiothoracic Surgery

## 2013-06-24 LAB — GLUCOSE, CAPILLARY
Glucose-Capillary: 196 mg/dL — ABNORMAL HIGH (ref 70–99)
Glucose-Capillary: 295 mg/dL — ABNORMAL HIGH (ref 70–99)
Glucose-Capillary: 163 mg/dL — ABNORMAL HIGH (ref 70–99)

## 2013-06-24 LAB — CBC
MCH: 30.2 pg (ref 26.0–34.0)
MCHC: 34.9 g/dL (ref 30.0–36.0)
MCV: 86.4 fL (ref 78.0–100.0)
Platelets: 162 10*3/uL (ref 150–400)
RBC: 3.68 MIL/uL — ABNORMAL LOW (ref 4.22–5.81)

## 2013-06-24 LAB — BASIC METABOLIC PANEL
BUN: 48 mg/dL — ABNORMAL HIGH (ref 6–23)
CO2: 27 mEq/L (ref 19–32)
Calcium: 8.6 mg/dL (ref 8.4–10.5)
Creatinine, Ser: 1.35 mg/dL (ref 0.50–1.35)
GFR calc non Af Amer: 57 mL/min — ABNORMAL LOW (ref >90)
Glucose, Bld: 146 mg/dL — ABNORMAL HIGH (ref 70–99)
Sodium: 135 mEq/L (ref 135–145)

## 2013-06-24 LAB — POCT I-STAT 3, ART BLOOD GAS (G3+)
Acid-Base Excess: 1 mmol/L (ref 0.0–2.0)
Bicarbonate: 26.1 mEq/L — ABNORMAL HIGH (ref 20.0–24.0)
O2 Saturation: 96 %
TCO2: 27 mmol/L (ref 0–100)
pCO2 arterial: 40.8 mmHg (ref 35.0–45.0)
pH, Arterial: 7.414 (ref 7.350–7.450)
pO2, Arterial: 84 mmHg (ref 80.0–100.0)

## 2013-06-24 LAB — PATHOLOGIST SMEAR REVIEW: Path Review: REACTIVE

## 2013-06-24 NOTE — Progress Notes (Addendum)
Patient ID: Eugene [name], male   DOB: 13-May-1956, 57 y.o.   MRN: [id number] TCTS DAILY ICU PROGRESS NOTE                   [address]            [place] [address]          [phone]   1 Day Post-Op Procedure(s) (LRB): SUBXYPHOID PERICARDIAL WINDOW (N/A) INTRAOPERATIVE TRANSESOPHAGEAL ECHOCARDIOGRAM (N/A)  Total Length of Stay:  LOS: 6 days   Subjective: Up in chair, with nausea inspite of Reglan and zofran  Objective: Vital signs in last 24 hours: Temp:  [97.9 F (36.6 C)-98.5 F (36.9 C)] 98 F (36.7 C) (10/29 0803) Pulse Rate:  [73-91] 75 (10/29 0800) Cardiac Rhythm:  [-] Normal sinus rhythm (10/28 2122) Resp:  [11-20] 16 (10/29 0800) BP: (94-173)/(63-96) 110/67 mmHg (10/29 0800) SpO2:  [92 %-100 %] 100 % (10/29 0800) Arterial Line BP: (99-221)/(36-89) 117/40 mmHg (10/29 0800) Weight:  [249 lb (112.946 kg)] 249 lb (112.946 kg) (10/29 0500)  Filed Weights   06/22/13 0432 06/23/13 0509 06/24/13 0500  Weight: 252 lb (114.306 kg) 252 lb 3.3 oz (114.4 kg) 249 lb (112.946 kg)    Weight change: -3 lb 3.3 oz (-1.454 kg)   Hemodynamic parameters for last 24 hours:    Intake/Output from previous day: 10/28 0701 - 10/29 0700 In: 2379.3 [P.O.:240; I.V.:2089.3; IV Piggyback:50] Out: 2872 [Urine:2525; Chest Tube:347]  Intake/Output this shift: Total I/O In: 5 [I.V.:5] Out: -   Current Meds: Scheduled Meds: . acetaminophen  1,000 mg Oral Q6H   Or  . acetaminophen (TYLENOL) oral liquid 160 mg/5 mL  1,000 mg Oral Q6H  . amLODipine  5 mg Oral Daily  . bisacodyl  10 mg Oral Daily  . budesonide-formoterol  2 puff Inhalation BID  . cefUROXime (ZINACEF)  IV  1.5 g Intravenous Q12H  . colchicine  0.6 mg Oral BID  . doxazosin  4 mg Oral QHS  . fenofibrate  160 mg Oral QHS  . fentaNYL   Intravenous Q4H  . insulin aspart  0-20 Units Subcutaneous TID WC  . insulin aspart  0-5 Units Subcutaneous QHS  . insulin glargine  30 Units Subcutaneous QHS  .  metoCLOPramide (REGLAN) injection  10 mg Intravenous Q6H  . metoprolol tartrate  25 mg Oral QHS  . mupirocin ointment  1 application Nasal BID  . pantoprazole  40 mg Oral QHS   Continuous Infusions: . dextrose 5 % and 0.9% NaCl 75 mL/hr at 06/24/13 0800   PRN Meds:.ALPRAZolam, diphenhydrAMINE, diphenhydrAMINE, naloxone, ondansetron (ZOFRAN) IV, oxyCODONE, oxyCODONE-acetaminophen, oxyCODONE-acetaminophen, potassium chloride, promethazine, senna-docusate, sodium chloride, traMADol  General appearance: alert and cooperative Neurologic: intact Heart: regular rate and rhythm, S1, S2 normal, no murmur, click, rub or gallop Lungs: diminished breath sounds bibasilar Abdomen: soft, non-tender; bowel sounds normal; no masses,  no organomegaly Extremities: extremities normal, atraumatic, no cyanosis or edema and Homans sign is negative, no sign of DVT Wound: intact, pericardial tube in place  about 400 ml drainage post op  Lab Results: CBC:  Recent Labs  06/22/13 1505 06/24/13 0425  WBC 11.3* 8.5  HGB 12.0* 11.1*  HCT 33.2* 31.8*  PLT 206 162   BMET:   Recent Labs  06/22/13 1505 06/24/13 0425  NA 129* 135  K 4.3 4.1  CL 90* 99  CO2 24 27  GLUCOSE 343* 146*  BUN 55* 48*  CREATININE 1.59* 1.35  CALCIUM 9.2  8.6    PT/INR:   Recent Labs  06/22/13 1505  LABPROT 13.0  INR 1.00   Chronic Kidney Disease   Stage I     GFR >90  Stage II    GFR 60-89  Stage IIIA GFR 45-59  Stage IIIB GFR 30-44  Stage IV   GFR 15-29  Stage V    GFR  <15  Lab Results  Component Value Date   CREATININE 1.35 06/24/2013   GFR: 57 BUN still elevated    Radiology: Ct Chest Without Contrast  06/22/2013   CLINICAL DATA:  Pericardial effusion.  EXAM: CT CHEST WITHOUT CONTRAST  TECHNIQUE: Multidetector CT imaging of the chest was performed following the standard protocol without IV contrast.  COMPARISON:  CT scan abdomen dated 06/18/2013 and CT scan of the chest dated 05/29/2011  FINDINGS: There  is a moderate pericardial effusion, slightly increased since 06/18/2013. Heart size is normal. Coronary artery calcification and numerous coronary stents are noted. No hilar or mediastinal adenopathy. No pulmonary infiltrates. Slight atelectasis at the lung bases. No effusions. The visualized portion of the upper abdomen is normal. No acute osseous abnormality.  IMPRESSION: Moderate pericardial effusion, slightly increased. The appearance is nonspecific.   Electronically Signed   By: Rozetta Nunnery M.D.   On: 06/22/2013 17:36   Dg Chest Portable 1 View  06/23/2013   CLINICAL DATA:  Status post central line and pericardial tube placement  EXAM: PORTABLE CHEST - 1 VIEW  COMPARISON:  06/22/2013  FINDINGS: Cardiac shadow remains mildly enlarged. A new right-sided jugular central line is noted with the tip in the distal superior vena cava. No pneumothorax is noted. The overall inspiratory effort is poor with crowding of the vascular markings. A pericardial drain is noted in the midline along the inferior cardiac border.  IMPRESSION: Status post line placement as described above.  Poor inspiratory effort with crowded vascular markings.   Electronically Signed   By: Inez Catalina M.D.   On: 06/23/2013 16:02     Assessment/Plan: S/P Procedure(s) (LRB): SUBXYPHOID PERICARDIAL WINDOW (N/A) INTRAOPERATIVE TRANSESOPHAGEAL ECHOCARDIOGRAM (N/A) Mobilize Diuresis Diabetes control See progression orders leave pericardial tube D/c pca pump, question source of nausea DM under better control Chronic Kidney disease IIIa Question efficacy of  Effient- P2Y12 was 329 preop, Patient says was taking everyday    Amelia Burgard B 06/24/2013 8:12 AM

## 2013-06-24 NOTE — Progress Notes (Signed)
Wasted reduced dose PCA Pump 70mLs = 330mcg. Wasted in sink. Witnessed by Tamera Punt, RN.

## 2013-06-24 NOTE — Progress Notes (Signed)
TCTS BRIEF SICU PROGRESS NOTE  1 Day Post-Op  S/P Procedure(s) (LRB): SUBXYPHOID PERICARDIAL WINDOW (N/A) INTRAOPERATIVE TRANSESOPHAGEAL ECHOCARDIOGRAM (N/A)   Stable from surgical standpoint  Plan: Continue current plan  Rexene Alberts 06/24/2013 4:37 PM

## 2013-06-25 ENCOUNTER — Inpatient Hospital Stay (HOSPITAL_COMMUNITY): Payer: PRIVATE HEALTH INSURANCE

## 2013-06-25 LAB — COMPREHENSIVE METABOLIC PANEL
ALT: 9 U/L (ref 0–53)
AST: 14 U/L (ref 0–37)
Albumin: 2.7 g/dL — ABNORMAL LOW (ref 3.5–5.2)
Alkaline Phosphatase: 51 U/L (ref 39–117)
BUN: 44 mg/dL — ABNORMAL HIGH (ref 6–23)
CO2: 27 mEq/L (ref 19–32)
Chloride: 102 mEq/L (ref 96–112)
GFR calc Af Amer: 72 mL/min — ABNORMAL LOW (ref >90)
Glucose, Bld: 139 mg/dL — ABNORMAL HIGH (ref 70–99)
Potassium: 3.9 mEq/L (ref 3.5–5.1)
Sodium: 137 mEq/L (ref 135–145)
Total Bilirubin: 0.4 mg/dL (ref 0.3–1.2)
Total Protein: 6.1 g/dL (ref 6.0–8.3)

## 2013-06-25 LAB — CBC
Hemoglobin: 10.5 g/dL — ABNORMAL LOW (ref 13.0–17.0)
MCHC: 35 g/dL (ref 30.0–36.0)
Platelets: 149 10*3/uL — ABNORMAL LOW (ref 150–400)
RDW: 13.5 % (ref 11.5–15.5)
WBC: 7.1 10*3/uL (ref 4.0–10.5)

## 2013-06-25 LAB — GLUCOSE, CAPILLARY
Glucose-Capillary: 189 mg/dL — ABNORMAL HIGH (ref 70–99)
Glucose-Capillary: 161 mg/dL — ABNORMAL HIGH (ref 70–99)
Glucose-Capillary: 159 mg/dL — ABNORMAL HIGH (ref 70–99)
Glucose-Capillary: 150 mg/dL — ABNORMAL HIGH (ref 70–99)

## 2013-06-25 MED ORDER — SENNOSIDES-DOCUSATE SODIUM 8.6-50 MG PO TABS
1.0000 | ORAL_TABLET | Freq: Two times a day (BID) | ORAL | Status: DC
  Administered 2013-06-25: 1 via ORAL
  Filled 2013-06-25 (×4): qty 1

## 2013-06-25 MED ORDER — LOSARTAN POTASSIUM 50 MG PO TABS
50.0000 mg | ORAL_TABLET | Freq: Every day | ORAL | Status: DC
  Administered 2013-06-26: 50 mg via ORAL
  Filled 2013-06-25 (×2): qty 1

## 2013-06-25 MED ORDER — COLCHICINE 0.6 MG PO TABS
0.6000 mg | ORAL_TABLET | Freq: Two times a day (BID) | ORAL | Status: DC
  Administered 2013-06-25 – 2013-06-26 (×2): 0.6 mg via ORAL
  Filled 2013-06-25 (×5): qty 1

## 2013-06-25 MED ORDER — METOPROLOL TARTRATE 25 MG PO TABS
25.0000 mg | ORAL_TABLET | Freq: Two times a day (BID) | ORAL | Status: DC
  Administered 2013-06-25 – 2013-06-26 (×3): 25 mg via ORAL
  Filled 2013-06-25 (×5): qty 1

## 2013-06-25 MED ORDER — METOCLOPRAMIDE HCL 5 MG PO TABS
5.0000 mg | ORAL_TABLET | Freq: Three times a day (TID) | ORAL | Status: DC
  Administered 2013-06-25 – 2013-06-26 (×2): 5 mg via ORAL
  Filled 2013-06-25 (×8): qty 1

## 2013-06-25 MED ORDER — BISACODYL 10 MG RE SUPP: 10.0000 mg | Freq: Every day | RECTAL | Status: DC | PRN

## 2013-06-25 NOTE — Op Note (Signed)
NAMEVASSILIOS, [name]           ACCOUNT NO.:  [id number]  MEDICAL RECORD NO.:  [id number]  LOCATION:  2S13C                        FACILITY:  [facility]  PHYSICIAN:  Lanelle Bal, MD    DATE OF BIRTH:  09-19-1955  DATE OF PROCEDURE:  06/23/2013 DATE OF DISCHARGE:                              OPERATIVE REPORT   PREOPERATIVE DIAGNOSIS:  Large pericardial effusion.  POSTOPERATIVE DIAGNOSIS:  Large pericardial effusion.  SURGICAL PROCEDURE:  Transesophageal echo and subxiphoid pericardial window with drainage of pericardial effusion.  SURGEON:  Lanelle Bal, MD  FIRST ASSISTANT:  [name], P.A.-C.  BRIEF HISTORY:  The patient is a 57 year old male with known renal insufficiency, chronic and history of coronary artery disease.  In January 2014, he had a stent placed in the right artery and has been on Effient since that time.  He was admitted 5 days prior to surgical consultation with pressure in his upper abdomen and left chest.  Serial echocardiograms revealed a moderate-to-large pericardial effusion both for therapeutic and diagnostic reasons.  Surgical consultation was obtained to drain the effusion, which was primarily posterior.  Risks and options were discussed with the patient in detail and he agreed with proceeding with drainage and signed informed consent.  DESCRIPTION OF PROCEDURE:  With central line and arterial line in place, the patient underwent general endotracheal anesthesia without incident. TEE probe was placed by Dr. Tobias Alexander and showed moderately large, primarily posterior and apical pericardial effusion.  After appropriate time-out, the skin of the chest was prepped with Betadine and draped in a sterile manner.  Small incision was made over this xiphoid process, and dissection was carried down identifying the pericardium. Pericardium was opened.  Fluid was obtained both for cell count, cytology, chemistry and cultures.  The fluid was approximately 400  mL of straw-colored fluid.  The pericardium did not appear to be thickened. Area of anterior apical pericardium was removed and was submitted to Pathology for histologic examination.  A Blake drain was placed to inferior recess of the pericardium and brought out through a separate site.  The incision was then closed with interrupted 0 Vicryl, running 3- 0 Vicryl in the subcutaneous tissue and 3-0 subcuticular stitch in skin edges.  Dry dressings were applied. The patient tolerated the procedure without obvious complication.  He was extubated in the operating room and transferred to the recovery room.  Blood loss was minimal.     Lanelle Bal, MD     EG/MEDQ  D:  06/24/2013  T:  06/25/2013  Job:  [id number]

## 2013-06-25 NOTE — Progress Notes (Addendum)
TCTS DAILY ICU PROGRESS NOTE                   [address]            [place] [address]          [phone]   2 Days Post-Op Procedure(s) (LRB): SUBXYPHOID PERICARDIAL WINDOW (N/A) INTRAOPERATIVE TRANSESOPHAGEAL ECHOCARDIOGRAM (N/A)  Total Length of Stay:  LOS: 7 days   Subjective: OOB in chair.  Just back from walk and feels tired.  Overall, no complaints.   Objective: Vital signs in last 24 hours: Temp:  [97.5 F (36.4 C)-98.5 F (36.9 C)] 98.4 F (36.9 C) (10/30 0440) Pulse Rate:  [72-95] 87 (10/30 0700) Cardiac Rhythm:  [-] Normal sinus rhythm (10/30 0400) Resp:  [13-23] 18 (10/30 0700) BP: (102-147)/(55-82) 102/59 mmHg (10/30 0700) SpO2:  [92 %-100 %] 97 % (10/30 0700) Arterial Line BP: (117)/(40) 117/40 mmHg (10/29 0800) Weight:  [249 lb 9 oz (113.2 kg)] 249 lb 9 oz (113.2 kg) (10/30 0500)  Filed Weights   06/23/13 0509 06/24/13 0500 06/25/13 0500  Weight: 252 lb 3.3 oz (114.4 kg) 249 lb (112.946 kg) 249 lb 9 oz (113.2 kg)    Weight change: 9 oz (0.254 kg)   Hemodynamic parameters for last 24 hours:    Intake/Output from previous day: 10/29 0701 - 10/30 0700 In: 1875 [P.O.:1410; I.V.:465] Out: 1425 [Urine:1325; Chest Tube:100]  CBGs [phone]     Current Meds: Scheduled Meds: . amLODipine  5 mg Oral Daily  . bisacodyl  10 mg Oral Daily  . budesonide-formoterol  2 puff Inhalation BID  . doxazosin  4 mg Oral QHS  . fenofibrate  160 mg Oral QHS  . insulin aspart  0-20 Units Subcutaneous TID WC  . insulin aspart  0-5 Units Subcutaneous QHS  . insulin glargine  30 Units Subcutaneous QHS  . metoprolol tartrate  25 mg Oral QHS  . mupirocin ointment  1 application Nasal BID  . pantoprazole  40 mg Oral QHS   Continuous Infusions: . dextrose 5 % and 0.9% NaCl 20 mL/hr at 06/24/13 0823   PRN Meds:.ALPRAZolam, oxyCODONE-acetaminophen, potassium chloride, promethazine, senna-docusate, traMADol   Physical Exam: General appearance:  alert, cooperative and no distress Heart: regular rate and rhythm Lungs: clear to auscultation bilaterally Wound: Dressed and dry   Lab Results: CBC: Recent Labs  06/24/13 0425 06/25/13 0420  WBC 8.5 7.1  HGB 11.1* 10.5*  HCT 31.8* 30.0*  PLT 162 149*   BMET:  Recent Labs  06/24/13 0425 06/25/13 0420  NA 135 137  K 4.1 3.9  CL 99 102  CO2 27 27  GLUCOSE 146* 139*  BUN 48* 44*  CREATININE 1.35 1.26  CALCIUM 8.6 8.4    PT/INR:  Recent Labs  06/22/13 1505  LABPROT 13.0  INR 1.00   Radiology: Dg Chest Portable 1 View  06/23/2013   CLINICAL DATA:  Status post central line and pericardial tube placement  EXAM: PORTABLE CHEST - 1 VIEW  COMPARISON:  06/22/2013  FINDINGS: Cardiac shadow remains mildly enlarged. A new right-sided jugular central line is noted with the tip in the distal superior vena cava. No pneumothorax is noted. The overall inspiratory effort is poor with crowding of the vascular markings. A pericardial drain is noted in the midline along the inferior cardiac border.  IMPRESSION: Status post line placement as described above.  Poor inspiratory effort with crowded vascular markings.   Electronically Signed   By: Elta Guadeloupe  Lukens M.D.   On: 06/23/2013 16:02     Assessment/Plan: S/P Procedure(s) (LRB): SUBXYPHOID PERICARDIAL WINDOW (N/A) INTRAOPERATIVE TRANSESOPHAGEAL ECHOCARDIOGRAM (N/A)  Chest tube output low, CXR stable.  Will d/c CT today.  Intraop cultures negative so far, path shows no evidence of malignancy.  DM- sugars generally stable.  Continue Lantus, titrate dose to home dose as able.  CV- BPs stable. Continue current meds. ?resume Effient -per cardiology.  CKD- Cr stable.  GI- nausea resolved, eating better.   Ok from [facility] standpoint to transfer to stepdown when ok with cardiology.  COLLINS,GINA H 06/25/2013 7:58 AM  Path noted-  Tube out today Cardiology to decide about long term anti platelet therapy I have seen and examined Eugene [name] and agree with the above assessment  and plan.  Grace Isaac MD Beeper [phone] Office [phone] 06/25/2013 8:18 AM

## 2013-06-25 NOTE — Progress Notes (Signed)
Patient Name: Eugene [name] Date of Encounter: 06/25/2013  Principal Problem:   Acute pericardial effusion Active Problems:   DM type 2 (diabetes mellitus, type 2)   Arteriosclerotic cardiovascular disease (ASCVD)   Hyponatremia   CHF (congestive heart failure)    SUBJECTIVE: Breathing much better, chest tube to be removed today. Ambulating pretty well. Feels weak. Had nausea yest and today. No BM.  OBJECTIVE Filed Vitals:   06/25/13 0500 06/25/13 0600 06/25/13 0700 06/25/13 0802  BP: 117/70 126/70 102/59   Pulse: 80 81 87   Temp:    98 F (36.7 C)  TempSrc:    Oral  Resp: 16 16 18    Height:      Weight: 249 lb 9 oz (113.2 kg)     SpO2: 99% 99% 97%     Intake/Output Summary (Last 24 hours) at 06/25/13 0809 Last data filed at 06/25/13 0700  Gross per 24 hour  Intake   1870 ml  Output   1260 ml  Net    610 ml   Filed Weights   06/23/13 0509 06/24/13 0500 06/25/13 0500  Weight: 252 lb 3.3 oz (114.4 kg) 249 lb (112.946 kg) 249 lb 9 oz (113.2 kg)    PHYSICAL EXAM General: Well developed, well nourished, male in no acute distress. Head: Normocephalic, atraumatic.  Neck: Supple without bruits, JVD. Lungs:  Resp regular and unlabored, rales bases. Heart: RRR, S1, S2, no S3, S4, + murmur; + rub. Abdomen: Soft, non-tender, non-distended, BS + x 4.  Extremities: No clubbing, cyanosis, no edema.  Neuro: Alert and oriented X 3. Moves all extremities spontaneously. Psych: Normal affect.  LABS: CBC:  Recent Labs  06/24/13 0425 06/25/13 0420  WBC 8.5 7.1  HGB 11.1* 10.5*  HCT 31.8* 30.0*  MCV 86.4 85.7  PLT 162 149*   INR:  Recent Labs  06/22/13 1505  INR [id number]   Basic Metabolic Panel:  Recent Labs  06/24/13 0425 06/25/13 0420  NA 135 137  K 4.1 3.9  CL 99 102  CO2 27 27  GLUCOSE 146* 139*  BUN 48* 44*  CREATININE 1.35 1.26  CALCIUM 8.6 8.4   Liver Function Tests:  Recent Labs  06/22/13 1505 06/25/13 0420  AST 14 14  ALT 12 9    ALKPHOS 99 51  BILITOT 0.2* 0.4  PROT 7.4 6.1  ALBUMIN 3.5 2.7*   BNP: Pro B Natriuretic peptide (BNP)  Date/Time Value Range Status  06/19/2013 10:18 PM 118.6  0 - 125 pg/mL Final  06/18/2013  9:28 PM 186.1* 0 - 125 pg/mL Final    TELE:  SR  Radiology/Studies: Dg Chest Portable 1 View 06/23/2013   CLINICAL DATA:  Status post central line and pericardial tube placement  EXAM: PORTABLE CHEST - 1 VIEW  COMPARISON:  06/22/2013  FINDINGS: Cardiac shadow remains mildly enlarged. A new right-sided jugular central line is noted with the tip in the distal superior vena cava. No pneumothorax is noted. The overall inspiratory effort is poor with crowding of the vascular markings. A pericardial drain is noted in the midline along the inferior cardiac border.  IMPRESSION: Status post line placement as described above.  Poor inspiratory effort with crowded vascular markings.   Electronically Signed   By: Inez Catalina M.D.   On: 06/23/2013 16:02     Current Medications:  . amLODipine  5 mg Oral Daily  . bisacodyl  10 mg Oral Daily  . budesonide-formoterol  2 puff Inhalation BID  .  doxazosin  4 mg Oral QHS  . fenofibrate  160 mg Oral QHS  . insulin aspart  0-20 Units Subcutaneous TID WC  . insulin aspart  0-5 Units Subcutaneous QHS  . insulin glargine  30 Units Subcutaneous QHS  . metoprolol tartrate  25 mg Oral QHS  . mupirocin ointment  1 application Nasal BID  . pantoprazole  40 mg Oral QHS   . dextrose 5 % and 0.9% NaCl 20 mL/hr at 06/24/13 [id number]    ASSESSMENT AND PLAN: Principal Problem:   Acute pericardial effusion - s/p window w/ 400 cc out initially, no malignant cells on cell count, monocytes, not neutrophils seen. Cultures negative so far. MD review. Think colchicine may help vs NSAIDS. TCTS has seen, chest tube to come out today. Tx telemetry, MD advise on d/c planning.  Active Problems:   DM type 2 (diabetes mellitus, type 2) - Continue SSI and home Rx, sugars better off  steroids.    Arteriosclerotic cardiovascular disease (ASCVD) - no ischemic symptoms; Lopressor 25 mg daily is home Rx. MD advise on increasing to BID.    Hyponatremia - resolved    CHF (congestive heart failure) - volume status improved with Rx of effusion.  Plan - d/c tube, Rx constipation, tx telem, increase activity.  Signed, Rosaria Ferries , PA-C 8:09 AM 06/25/2013  Agree with colchicine.  Increase lopressor to bid.  Consider changing calcium blocker to ACE given DM as outpatient if Cr continues to improve.  D/C central line Has two good peripheral ivs  Nurse to pull chest tube now.  Likely d/c in a.m.  Jenkins Rouge

## 2013-06-25 NOTE — Progress Notes (Signed)
Report called to [facility] and pt transferred to 2W07 via wheel chair.  All VS WNL.  Pt had no s/s of resp. Distress post d/c of CT.  Pt called wife to advise her of transfer prior to departing unit.

## 2013-06-26 ENCOUNTER — Inpatient Hospital Stay (HOSPITAL_COMMUNITY): Payer: PRIVATE HEALTH INSURANCE

## 2013-06-26 DIAGNOSIS — I472 Ventricular tachycardia: Secondary | ICD-10-CM

## 2013-06-26 DIAGNOSIS — I313 Pericardial effusion (noninflammatory): Secondary | ICD-10-CM

## 2013-06-26 DIAGNOSIS — I5031 Acute diastolic (congestive) heart failure: Secondary | ICD-10-CM

## 2013-06-26 DIAGNOSIS — E1149 Type 2 diabetes mellitus with other diabetic neurological complication: Secondary | ICD-10-CM

## 2013-06-26 LAB — GLUCOSE, CAPILLARY: Glucose-Capillary: 174 mg/dL — ABNORMAL HIGH (ref 70–99)

## 2013-06-26 MED ORDER — METOPROLOL TARTRATE 25 MG PO TABS: 25.0000 mg | ORAL_TABLET | Freq: Two times a day (BID) | ORAL | Status: DC

## 2013-06-26 MED ORDER — BISACODYL 5 MG PO TBEC: 10.0000 mg | DELAYED_RELEASE_TABLET | Freq: Every day | ORAL | Status: DC | PRN

## 2013-06-26 MED ORDER — TICAGRELOR 90 MG PO TABS: 90.0000 mg | ORAL_TABLET | Freq: Two times a day (BID) | ORAL | Status: DC

## 2013-06-26 MED ORDER — COLCHICINE 0.6 MG PO TABS: 0.6000 mg | ORAL_TABLET | Freq: Two times a day (BID) | ORAL | Status: DC

## 2013-06-26 NOTE — Addendum Note (Signed)
Addendum created 06/26/13 1722 by Duane Boston, MD   Modules edited: Anesthesia Attestations, Anesthesia Responsible Staff

## 2013-06-26 NOTE — Progress Notes (Signed)
[address]       [place] [address]             [phone]      3 Days Post-Op  Procedure(s) (LRB): SUBXYPHOID PERICARDIAL WINDOW (N/A) INTRAOPERATIVE TRANSESOPHAGEAL ECHOCARDIOGRAM (N/A) Subjective: Feels well not SOB  Objective  Telemetry sinus, pvc's including bigemminy  Temp:  [98 F (36.7 C)-98.6 F (37 C)] 98.6 F (37 C) (10/31 0441) Pulse Rate:  [75-88] 84 (10/31 0441) Resp:  [12-20] 20 (10/31 0441) BP: (108-154)/(62-76) 122/76 mmHg (10/31 0441) SpO2:  [92 %-95 %] 95 % (10/31 0441) Weight:  [250 lb (113.399 kg)] 250 lb (113.399 kg) (10/31 0441)   Intake/Output Summary (Last 24 hours) at 06/26/13 0751 Last data filed at 06/26/13 0443  Gross per 24 hour  Intake   1260 ml  Output    300 ml  Net    960 ml       General appearance: alert, cooperative and no distress Heart: regular rate and rhythm Lungs: clear to auscultation bilaterally Abdomen: benign Extremities: min edema Wound: incision healing well  Lab Results:  Recent Labs  06/24/13 0425 06/25/13 0420  NA 135 137  K 4.1 3.9  CL 99 102  CO2 27 27  GLUCOSE 146* 139*  BUN 48* 44*  CREATININE 1.35 1.26  CALCIUM 8.6 8.4    Recent Labs  06/25/13 0420  AST 14  ALT 9  ALKPHOS 51  BILITOT 0.4  PROT 6.1  ALBUMIN 2.7*   No results found for this basename: LIPASE, AMYLASE,  in the last 72 hours  Recent Labs  06/24/13 0425 06/25/13 0420  WBC 8.5 7.1  HGB 11.1* 10.5*  HCT 31.8* 30.0*  MCV 86.4 85.7  PLT 162 149*   No results found for this basename: CKTOTAL, CKMB, TROPONINI,  in the last 72 hours No components found with this basename: POCBNP,  No results found for this basename: DDIMER,  in the last 72 hours No results found for this basename: HGBA1C,  in the last 72 hours No results found for this basename: CHOL, HDL, LDLCALC, TRIG, CHOLHDL,  in the last 72 hours No results found for this basename: TSH, T4TOTAL, FREET3, T3FREE, THYROIDAB,  in the last 72  hours No results found for this basename: VITAMINB12, FOLATE, FERRITIN, TIBC, IRON, RETICCTPCT,  in the last 72 hours  Medications: Scheduled . bisacodyl  10 mg Oral Daily  . budesonide-formoterol  2 puff Inhalation BID  . colchicine  0.6 mg Oral BID  . doxazosin  4 mg Oral QHS  . fenofibrate  160 mg Oral QHS  . insulin aspart  0-20 Units Subcutaneous TID WC  . insulin aspart  0-5 Units Subcutaneous QHS  . insulin glargine  30 Units Subcutaneous QHS  . losartan  50 mg Oral Daily  . metoCLOPramide  5 mg Oral TID AC & HS  . metoprolol tartrate  25 mg Oral BID  . mupirocin ointment  1 application Nasal BID  . pantoprazole  40 mg Oral QHS  . senna-docusate  1 tablet Oral BID     Radiology/Studies:  Dg Chest 2 View  06/26/2013   CLINICAL DATA:  Shortness of breath, CHF  EXAM: CHEST  2 VIEW  COMPARISON:  06/25/2013  FINDINGS: Low lung volumes with vascular crowding and mild bilateral lower lobe atelectasis. No frank interstitial edema. No pleural effusion or pneumothorax.  The heart is top-normal in size for inspiration.  Mild degenerative changes of the  visualized thoracolumbar spine.  IMPRESSION: Low lung volumes with vascular crowding. No frank interstitial edema.  Bilateral lower lobe atelectasis.   Electronically Signed   By: Julian Hy M.D.   On: 06/26/2013 07:39   Dg Chest Port 1 View  06/25/2013   CLINICAL DATA:  Pericardial effusion  EXAM: PORTABLE CHEST - 1 VIEW  COMPARISON:  06/23/2013  FINDINGS: The cardiac shadow is stable. A right-sided jugular line and pericardial catheter are again noted and stable. No pneumothorax is seen. Mild left basilar atelectasis is noted.  IMPRESSION: Tubes and lines as described.  Mild left basilar atelectasis.   Electronically Signed   By: Inez Catalina M.D.   On: 06/25/2013 08:09    INR: Will add last result for INR, ABG once components are confirmed Will add last 4 CBG results once components are confirmed  Assessment/Plan: S/P  Procedure(s) (LRB): SUBXYPHOID PERICARDIAL WINDOW (N/A) INTRAOPERATIVE TRANSESOPHAGEAL ECHOCARDIOGRAM (N/A)  Rhythm- noted, cardiology managing medical issues CBG's adeq control Doing well,, will arrange appt for follow-up Ok for d/c when ok with cardiology    LOS: 8 days    Fortunata Betty E 10/31/20147:51 AM

## 2013-06-26 NOTE — Discharge Summary (Signed)
Discharge Summary   Patient ID: Eugene [name],  MRN: [id number], DOB/AGE: 1956/04/18 57 y.o.  Admit date: 06/18/2013 Discharge date: 06/26/2013  Primary Physician: [name], MD Primary Cardiologist: Kristian Covey MD; previously R. Lattie Haw, MD  Discharge Diagnoses Principal Problem:   Pericardial effusion Active Problems:   DM type 2 (diabetes mellitus, type 2)   Hypertension   Arteriosclerotic cardiovascular disease (ASCVD)   Hyperlipidemia   Hyponatremia   Gastroesophageal reflux disease   ARF (acute renal failure)   NSVT (nonsustained ventricular tachycardia)   Type 2 diabetes mellitus with neurological manifestations   Acute diastolic CHF (congestive heart failure)   Allergies Allergies  Allergen Reactions  . Hydrocodone Nausea And Vomiting  . Lisinopril Cough  . Neurontin [Gabapentin]     Side effects, suicidal thoughts  . Statins Other (See Comments)    Muscle aches    Diagnostic Studies/Procedures  CT ABDOMEN/PEVLIS W/O CONTRAST - 06/18/13  IMPRESSION: Moderately large pericardial effusion.  No acute abnormality in the abdomen.  2D ECHOCARDIOGRAM WITHOUT CONTRAST - 06/19/13  - Left ventricle: The cavity size was normal. Wall thickness was increased in a pattern of moderate LVH. Systolic function was normal. The estimated ejection fraction was in the range of 60% to 65%. Features are consistent with a pseudonormal left ventricular filling pattern, with concomitant abnormal relaxation and increased filling pressure (grade 2 diastolic dysfunction). - Pericardium, extracardiac: Moderate to large pericardial effusion surrounds heart Measures approximately 25 mm in maximal dimension. There is mild indentation of RA, minimal RV end diastolic indentation. There is mitral inflow variation present with respiration. IVC is mildly dilated but it decreases in size with inspiration. Overall does not meet echo criteria for tamponade. Clinical correlation  indicated.  CT CHEST W/O CONTRAST - 06/22/13  IMPRESSION: Moderate pericardial effusion, slightly increased. The appearance is nonspecific.  2D ECHOCARDIOGRAM WITH CONTRAST - 06/22/13  Study Conclusions  - Left ventricle: The cavity size was normal. Wall thickness was increased in a pattern of mild LVH. Systolic function was vigorous. The estimated ejection fraction was in the range of 65% to 70%. - Pericardium, extracardiac: A small pericardial effusion was identified posterior to the heart and circumferential to the heart. Features were not consistent with tamponade physiology.  TEE AND SUBXIPHOID PERICARDIAL WINDOW WITH DRAINAGE OF PERICARDIAL EFFUSION - 06/23/13  See full op note for complete details. Notably, a moderately large primarily posterior and apical pericardial effusion was appreciated. 400 mL of straw colored fluid was drained. No pericardial thickening. Chest tube placed.   PORTABLE CHEST X-RAY - 06/23/13  IMPRESSION: Status post line placement as described above.  Poor inspiratory effort with crowded vascular markings.  TRANSESOPHAGEAL ECHOCARDIOGRAM - 06/23/13  - Left ventricle: The cavity size was normal. Wall thickness was normal. Systolic function was normal. The estimated ejection fraction was in the range of 60% to 65%. Wall motion was normal; there were no regional wall motion abnormalities. - Pericardium, extracardiac: A moderate pericardial effusion was identified. There was no chamber collapse. There was no residual pericardial effusion post drainage.  PORTABLE CHEST X-RAY - 06/25/13  IMPRESSION: Tubes and lines as described.  Mild left basilar atelectasis.  PA/LATERAL CHEST X-RAY - 06/26/13  IMPRESSION: Low lung volumes with vascular crowding. No frank interstitial edema.  Bilateral lower lobe atelectasis.  History of Present Illness  Eugene [name] is a 57 y.o. male who was transferred from [facility] from [place] on  06/18/13 with the above problem list.   He is a 57  yo male w/ PMHx s/f IDDM, CAD (s/p DES-RCA x 2 in 05/2011, DES-RCA in 08/2012 in setting of STEMI), HTN, HLD, obesity, OSA, COPD and GERD.   He had complained of left-sided constant chest pain, not aggravated by position changes or exertion with associated worsening SOB and cough. CT abd/pelvix as above revealed a moderately large pericardial effusion. Pro BNP was only mildly elevated. TnI WNL. CMET did reveal a mild hyponatremia (Na 132, BUN 50/Cr 1.65).   The patient was assessed and deemed to be stable hemodynamically. No evidence of tamponade. He was admitted for further evaluation/management.   Hospital Course   Renal function improved the following morning. He ruled out with three negative troponins. 2D echo was ordered in the morning as above which indicated moderate-large pericardial effusion, no evidence of tamponade. Etiology of the effusion was not immediately clear. He was stable in the short term follow-up period post-PCI earlier this year. Effusion was felt to have occurred subacutely. Tb negative. The patient did continue to experience discomfort. TCTS was consulted for possible therapeutic/diagnostic pericardial window. He was deemed to be an acceptable candidate. He was informed, consented and prepped for the procedure detailed above.   He tolerated the procedure well without complications. Pathology indicated no evidence of malignancy. He was eventually transferred to telemetry. He ambulated well without incident. Chest tube was removed. He was deemed to be appropriate for discharge by TCTS and Dr. Johnsie Cancel today. He will be discharged on the medication regimen outlined below.   He was treated with colchicine 0.6mg  BID on admission. This will be continued x 14 post-discharge. DAPT was held temporarily for the procedure. P2Y12 was checked and found to be elevated at 329 despite adherence to Effient. This will be stopped in favor of  Brilinta to begin tonight. No load will be prescribed given recent chest tube placement. He will follow-up in the office in 1 week (TCM appointment) in the [place] office. Repeat echo has been scheduled at [facility] in 6 weeks. He will be contacted by the  [place] office regarding follow-up appointment. Suture will be removed at that time. This information, including supplemental pericardial effusion education and activity restrictions, has been clearly outlined in the discharge AVS.   Discharge Vitals:  Blood pressure 122/76, pulse 84, temperature 98.6 F (37 C), temperature source Oral, resp. rate 20, height 6\' 1"  (1.854 m), weight 250 lb (113.399 kg), SpO2 95.00%.  Weight change: 7 oz (0.199 kg)  Labs: Recent Labs     06/24/13  0425  06/25/13  0420  WBC  8.5  7.1  HGB  11.1*  10.5*  HCT  31.8*  30.0*  MCV  86.4  85.7  PLT  162  149*   Recent Labs Lab 06/22/13 1505 06/24/13 0425 06/25/13 0420  NA 129* 135 137  K 4.3 4.1 3.9  CL 90* 99 102  CO2 24 27 27   BUN 55* 48* 44*  CREATININE 1.59* 1.35 1.26  CALCIUM 9.2 8.6 8.4  PROT 7.4  --  6.1  BILITOT 0.2*  --  0.4  ALKPHOS 99  --  51  ALT 12  --  9  AST 14  --  14  GLUCOSE 343* 146* 139*   Disposition:  Discharge Orders   Future Appointments Provider Department Dept Phone   07/03/2013 1:50 PM Lendon Colonel, NP [facility] [place] [phone]   08/07/2013 2:00 PM [facility] [place] [id number]   08/24/2013 3:00 PM [name], MD  [place] [phone]   Future Orders Complete By Expires   Diet - low sodium heart healthy  As directed    Increase activity slowly  As directed          Follow-up Information   Follow up with GERHARDT,EDWARD B, MD. ( office will arrange for folloow-up and suture removal)    Specialty:  Cardiothoracic Surgery   Contact information:   [address] [place] [address] [phone]       Follow up  with Jory Sims, NP On 07/03/2013. (At 1:70 PM)    Specialty:  Nurse Practitioner   Contact information:   [address] [place] [address] [phone]       Follow up with [facility] On 08/07/2013. (Outpatient repeat echocardiogram (heart ultrasound) at 2:00 PM. Main entrance. )    Contact information:   [address] [place] [phone] [id number]      Discharge Medications:    Medication List    STOP taking these medications       amLODipine 5 MG tablet  Commonly known as:  NORVASC     prasugrel 10 MG Tabs tablet  Commonly known as:  EFFIENT      TAKE these medications       ALPRAZolam 0.5 MG tablet  Commonly known as:  XANAX  Take one half to one tablet by mouth at bedtime as needed for sleep.     aspirin EC 81 MG tablet  Take 81 mg by mouth at bedtime.     bisacodyl 5 MG EC tablet  Commonly known as:  DULCOLAX  Take 2 tablets (10 mg total) by mouth daily as needed for constipation.     budesonide-formoterol 160-4.5 MCG/ACT inhaler  Commonly known as:  SYMBICORT  Inhale 2 puffs into the lungs 2 (two) times daily.     colchicine 0.6 MG tablet  Take 1 tablet (0.6 mg total) by mouth 2 (two) times daily.     doxazosin 4 MG tablet  Commonly known as:  CARDURA  Take 1 tablet (4 mg total) by mouth at bedtime.     fenofibrate 160 MG tablet  Take 160 mg by mouth at bedtime.     insulin glargine 100 UNIT/ML injection  Commonly known as:  LANTUS  Inject 0.8 mLs (80 Units total) into the skin at bedtime.     metoprolol tartrate 25 MG tablet  Commonly known as:  LOPRESSOR  Take 1 tablet (25 mg total) by mouth 2 (two) times daily.     nitroGLYCERIN 0.4 MG SL tablet  Commonly known as:  NITROSTAT  Place 1 tablet (0.4 mg total) under the tongue every 5 (five) minutes x 3 doses as needed for chest pain.     pantoprazole 40 MG tablet  Commonly known as:  PROTONIX  Take 40 mg by mouth at bedtime.     Ticagrelor 90 MG Tabs tablet    Commonly known as:  BRILINTA  Take 1 tablet (90 mg total) by mouth 2 (two) times daily.     torsemide 20 MG tablet  Commonly known as:  DEMADEX  Take 20-40 mg by mouth 2 (two) times daily. Two tablets in the morning and one tablet at bedtime     traMADol 50 MG tablet  Commonly known as:  ULTRAM  Take 1 tablet (50 mg total) by mouth every 6 (six) hours as needed for pain.     valsartan 320 MG tablet  Commonly known as:  DIOVAN  Take 320 mg by mouth at bedtime.       Outstanding Labs/Studies: 2D echo in 6 weeks  Duration of Discharge Encounter: Greater than 30 minutes including physician time.  Signed, R. Valeria Batman, PA-C 06/26/2013, 9:48 AM

## 2013-06-26 NOTE — Progress Notes (Signed)
Patient ID: Eugene [name], male   DOB: October 10, 1955, 57 y.o.   MRN: [id number]     Patient Name: Eugene [name] Date of Encounter: 06/26/2013  Principal Problem:   Acute pericardial effusion Active Problems:   DM type 2 (diabetes mellitus, type 2)   Arteriosclerotic cardiovascular disease (ASCVD)   Hyponatremia   CHF (congestive heart failure)    SUBJECTIVE: Breathing much better, chest tube to be removed today. Ambulating pretty well. Feels weak. Had nausea yest and today. No BM.  OBJECTIVE Filed Vitals:   06/25/13 1136 06/25/13 1345 06/25/13 2034 06/26/13 0441  BP: 124/66 121/62 154/75 122/76  Pulse: 76 75 88 84  Temp: 98.2 F (36.8 C) 98.1 F (36.7 C) 98.6 F (37 C) 98.6 F (37 C)  TempSrc: Oral Oral Oral Oral  Resp: 20 20 18 20   Height:      Weight:    250 lb (113.399 kg)  SpO2: 94% 95% 94% 95%    Intake/Output Summary (Last 24 hours) at 06/26/13 0838 Last data filed at 06/26/13 0700  Gross per 24 hour  Intake   1380 ml  Output    500 ml  Net    880 ml   Filed Weights   06/24/13 0500 06/25/13 0500 06/26/13 0441  Weight: 249 lb (112.946 kg) 249 lb 9 oz (113.2 kg) 250 lb (113.399 kg)    PHYSICAL EXAM General: Well developed, well nourished, male in no acute distress. Head: Normocephalic, atraumatic.  Neck: Supple without bruits, JVD. Lungs:  Resp regular and unlabored, rales bases. Heart: RRR, S1, S2, no S3, S4, + murmur; + rub. Abdomen: Soft, non-tender, non-distended, BS + x 4.  Extremities: No clubbing, cyanosis, no edema.  Neuro: Alert and oriented X 3. Moves all extremities spontaneously. Psych: Normal affect.  LABS: CBC:  Recent Labs  06/24/13 0425 06/25/13 0420  WBC 8.5 7.1  HGB 11.1* 10.5*  HCT 31.8* 30.0*  MCV 86.4 85.7  PLT 162 149*   INR: No results found for this basename: INR,  in the last 72 hours Basic Metabolic Panel:  Recent Labs  06/24/13 0425 06/25/13 0420  NA 135 137  K 4.1 3.9  CL 99 102  CO2 27 27   GLUCOSE 146* 139*  BUN 48* 44*  CREATININE 1.35 1.26  CALCIUM 8.6 8.4   Liver Function Tests:  Recent Labs  06/25/13 0420  AST 14  ALT 9  ALKPHOS 51  BILITOT 0.4  PROT 6.1  ALBUMIN 2.7*   BNP: Pro B Natriuretic peptide (BNP)  Date/Time Value Range Status  06/19/2013 10:18 PM 118.6  0 - 125 pg/mL Final  06/18/2013  9:28 PM 186.1* 0 - 125 pg/mL Final    TELE:  SR  Radiology/Studies: Dg Chest Portable 1 View 06/23/2013   CLINICAL DATA:  Status post central line and pericardial tube placement  EXAM: PORTABLE CHEST - 1 VIEW  COMPARISON:  06/22/2013  FINDINGS: Cardiac shadow remains mildly enlarged. A new right-sided jugular central line is noted with the tip in the distal superior vena cava. No pneumothorax is noted. The overall inspiratory effort is poor with crowding of the vascular markings. A pericardial drain is noted in the midline along the inferior cardiac border.  IMPRESSION: Status post line placement as described above.  Poor inspiratory effort with crowded vascular markings.   Electronically Signed   By: Inez Catalina M.D.   On: 06/23/2013 16:02     Current Medications:  . bisacodyl  10 mg Oral Daily  .  budesonide-formoterol  2 puff Inhalation BID  . colchicine  0.6 mg Oral BID  . doxazosin  4 mg Oral QHS  . fenofibrate  160 mg Oral QHS  . insulin aspart  0-20 Units Subcutaneous TID WC  . insulin aspart  0-5 Units Subcutaneous QHS  . insulin glargine  30 Units Subcutaneous QHS  . losartan  50 mg Oral Daily  . metoCLOPramide  5 mg Oral TID AC & HS  . metoprolol tartrate  25 mg Oral BID  . mupirocin ointment  1 application Nasal BID  . pantoprazole  40 mg Oral QHS  . senna-docusate  1 tablet Oral BID   . dextrose 5 % and 0.9% NaCl 20 mL/hr at 06/24/13 [id number]    ASSESSMENT AND PLAN: Principal Problem:   Acute pericardial effusion - s/p window w/ 400 cc out initially, no malignant cells on cell count, monocytes, not neutrophils seen. Cultures negative so far.   Think colchicine may help  Continue for 2 weeks Post discharge.  F/U echo in 6 weeks or so in [place].    Active Problems:   DM type 2 (diabetes mellitus, type 2) - Continue SSI and home Rx, sugars better off steroids.    Arteriosclerotic cardiovascular disease (ASCVD) - no ischemic symptoms; Lopressor 25 mg bid  Will try brillinta for antiplatlet since P2Y was high on Effient And patient indicated he was compliant  D/C home F/U [place] office has one suture to be removed next week at chest tube sight   Rozann Lesches , PA-C 8:38 AM 06/26/2013

## 2013-06-27 LAB — BODY FLUID CULTURE: Culture: NO GROWTH

## 2013-06-29 ENCOUNTER — Telehealth: Payer: Self-pay | Admitting: *Deleted

## 2013-06-29 LAB — VIRAL CULTURE VIRC

## 2013-06-29 MED ORDER — CLOPIDOGREL BISULFATE 75 MG PO TABS: 75.0000 mg | ORAL_TABLET | Freq: Every day | ORAL | Status: DC

## 2013-06-29 NOTE — Telephone Encounter (Signed)
Pt was placed on berlinta and his insurance has a $50 copay needs [place] to switch it to something cheaper. Please call something in to [facility]. Please call patient and let him know something he has been out since Friday

## 2013-06-29 NOTE — Telephone Encounter (Signed)
Change to Plavix (generic clopridogrel) 75 mg daily. Will discuss further on follow up appt on 07/03/2013

## 2013-06-29 NOTE — Telephone Encounter (Signed)
Please advise 

## 2013-06-29 NOTE — Telephone Encounter (Signed)
Change to Plavix 75 mg daily. Medication sent via escribe.

## 2013-07-01 ENCOUNTER — Other Ambulatory Visit: Payer: Self-pay | Admitting: *Deleted

## 2013-07-01 DIAGNOSIS — I313 Pericardial effusion (noninflammatory): Secondary | ICD-10-CM

## 2013-07-02 ENCOUNTER — Ambulatory Visit (HOSPITAL_COMMUNITY)
Admission: RE | Admit: 2013-07-02 | Discharge: 2013-07-02 | Disposition: A | Discharge status: Home / Self Care | Payer: PRIVATE HEALTH INSURANCE | Source: Ambulatory Visit | Attending: Family Medicine | Admitting: Family Medicine

## 2013-07-02 ENCOUNTER — Ambulatory Visit (INDEPENDENT_AMBULATORY_CARE_PROVIDER_SITE_OTHER): Payer: PRIVATE HEALTH INSURANCE | Admitting: Family Medicine

## 2013-07-02 ENCOUNTER — Telehealth: Payer: Self-pay | Admitting: *Deleted

## 2013-07-02 VITALS — BP 102/60 | Ht 73.0 in | Wt 262.0 lb

## 2013-07-02 DIAGNOSIS — J9819 Other pulmonary collapse: Secondary | ICD-10-CM | POA: Insufficient documentation

## 2013-07-02 DIAGNOSIS — R0602 Shortness of breath: Secondary | ICD-10-CM

## 2013-07-02 DIAGNOSIS — I509 Heart failure, unspecified: Secondary | ICD-10-CM

## 2013-07-02 DIAGNOSIS — I5023 Acute on chronic systolic (congestive) heart failure: Secondary | ICD-10-CM

## 2013-07-02 DIAGNOSIS — I959 Hypotension, unspecified: Secondary | ICD-10-CM

## 2013-07-02 LAB — PRO B NATRIURETIC PEPTIDE: Pro B Natriuretic peptide (BNP): 825 pg/mL — ABNORMAL HIGH (ref <126)

## 2013-07-02 LAB — BASIC METABOLIC PANEL
BUN: 26 mg/dL — ABNORMAL HIGH (ref 6–23)
Calcium: 9.5 mg/dL (ref 8.4–10.5)
Creat: 1.36 mg/dL — ABNORMAL HIGH (ref 0.50–1.35)
Glucose, Bld: 194 mg/dL — ABNORMAL HIGH (ref 70–99)
Potassium: 5 mEq/L (ref 3.5–5.3)

## 2013-07-02 NOTE — Telephone Encounter (Signed)
error 

## 2013-07-02 NOTE — Progress Notes (Signed)
  Subjective:    Patient ID: Eugene [name], male    DOB: [birth year], 57 y.o.   MRN: [id number]  HPI Patient arrives not feeling well-dizzy light headed.  Had window heart procedure last week and had fluid drained off heart- Bp normally runs 150s over 90s and today running low. Last week patient had a procedure to cut a window in the paracardial due to pericardial effusion. He has not had any fevers but he states over the past couple days he's felt dizzy and short of breath. He denies PND denies swelling in the legs no vomiting or diarrhea denies sweats chills or fever. He brings his medicines in for review. I reviewed over the hospital records in detail while he was present plus also his full list of medicines and also all of his pill bottles. Apparently he was taking amlodipine when he was told to stop that and he had reduced his diuretic down to one tablet a day rather than taking the 3 that was prescribed. His blood pressure was a little bit low today He has a past medical history of pericardial effusion heart attacks congestive heart failure her severe diabetes and renal disease. Patient does not smoke Family history noncontributory Review of Systems He denies headaches fevers chills cough he does relate fatigue and dizziness he also relates no runny nose or congestion also some cough some shortness of breath no chest pressure or pain some musculoskeletal chest pain where the surgery was denies abdominal pain denies swelling in the legs. Denies confusion    Objective:   Physical Exam  Vital sign blood pressure is low when he stands it drops from 130/70-110/70 lungs are clear heart regular surgical wound looks good abdomen soft extremities no edema Stat chest x-ray and stat BNP metabolic 7 were ordered     Assessment & Plan:  #1 hypotension-he is to reduce doxazosin O. some to a half a tablet a day Stop amlodipine. #2 CHF-BNP is moderately elevated. I believe the best approach is to  return resume his diuretic use 3 per day. She took 2 extra today starting tomorrow 2 in the morning 1 in the afternoon he will followup with cardiology early next week he'll followup with [place] in 2 weeks. Patient was talked to over the phone and understood these directions. Also to go to ER if worse. 50 minutes spent with patient

## 2013-07-03 ENCOUNTER — Ambulatory Visit (INDEPENDENT_AMBULATORY_CARE_PROVIDER_SITE_OTHER): Payer: Self-pay

## 2013-07-03 ENCOUNTER — Encounter: Payer: PRIVATE HEALTH INSURANCE | Admitting: Adult Health

## 2013-07-03 DIAGNOSIS — Z4802 Encounter for removal of sutures: Secondary | ICD-10-CM

## 2013-07-03 DIAGNOSIS — I2510 Atherosclerotic heart disease of native coronary artery without angina pectoris: Secondary | ICD-10-CM

## 2013-07-03 DIAGNOSIS — I313 Pericardial effusion (noninflammatory): Secondary | ICD-10-CM

## 2013-07-03 NOTE — Progress Notes (Signed)
Removed 1 chest tube suture, no signs of infection and patient tolerated well.

## 2013-07-07 ENCOUNTER — Ambulatory Visit (INDEPENDENT_AMBULATORY_CARE_PROVIDER_SITE_OTHER): Payer: PRIVATE HEALTH INSURANCE | Admitting: Adult Health

## 2013-07-07 ENCOUNTER — Encounter: Payer: Self-pay | Admitting: Adult Health

## 2013-07-07 VITALS — BP 94/60 | HR 77 | Ht 73.0 in | Wt 250.0 lb

## 2013-07-07 DIAGNOSIS — I313 Pericardial effusion (noninflammatory): Secondary | ICD-10-CM

## 2013-07-07 DIAGNOSIS — I10 Essential (primary) hypertension: Secondary | ICD-10-CM

## 2013-07-07 DIAGNOSIS — I3139 Other pericardial effusion (noninflammatory): Secondary | ICD-10-CM

## 2013-07-07 DIAGNOSIS — I319 Disease of pericardium, unspecified: Secondary | ICD-10-CM

## 2013-07-07 MED ORDER — VALSARTAN 80 MG PO TABS: 80.0000 mg | ORAL_TABLET | Freq: Every day | ORAL | Status: DC

## 2013-07-07 MED ORDER — TORSEMIDE 20 MG PO TABS: 20.0000 mg | ORAL_TABLET | Freq: Two times a day (BID) | ORAL | Status: DC

## 2013-07-07 NOTE — Progress Notes (Signed)
HPI: [name] is a 57 year old former patient of Dr. Pennelope Bracken, we are following for ongoing assessment and management of CAD with recent admission to [place] for ST elevation MI in January of 2014, status post PCI and DES placement to the right coronary artery. He was also readmitted to [facility] in the setting of recurrent chest discomfort. He was treated for acute on chronic diastolic heart failure at that time. He was last seen in the office on 05/04/2013 with a 6 pound weight gain from discharge and continued hypertension. He admits to dietary noncompliance.    Unless this is his index this was increased to 80 mg in a.m. and 40 mg in the p.m. this was to occur over 2 days, and then he was to return back to Demadex 40 mg twice a day. Followup BMET was completed.    Sodium 134, potassium 5.0, chloride 99, CO2 26, BUN 26, creatinine 1.36. Pro BNP 825.   He comes today with complaints of light headedness and dizziness with position change. He was seen by Dr. [name] and cardura was decreased from 4mg  daily to 2 mg daily. Diovan was decreased to 160 mg from 320 mg. He is without complaints of worsening DOE. He had one episode of chest discomfort which awakened him from sleep, but nothing recurrent. He has a repeat  echo scheduled in Dec. Due to cost of Brilinta, he was changed to Plavix.  Allergies  Allergen Reactions  . Hydrocodone Nausea And Vomiting  . Lisinopril Cough  . Neurontin [Gabapentin]     Side effects, suicidal thoughts  . Statins Other (See Comments)    Muscle aches    Current Outpatient Prescriptions  Medication Sig Dispense Refill  . ALPRAZolam (XANAX) 0.5 MG tablet Take one half to one tablet by mouth at bedtime as needed for sleep.  30 tablet  2  . aspirin EC 81 MG tablet Take 81 mg by mouth at bedtime.       . bisacodyl (DULCOLAX) 5 MG EC tablet Take 2 tablets (10 mg total) by mouth daily as needed for constipation.  30 tablet  0  .  budesonide-formoterol (SYMBICORT) 160-4.5 MCG/ACT inhaler Inhale 2 puffs into the lungs 2 (two) times daily.      . clopidogrel (PLAVIX) 75 MG tablet Take 1 tablet (75 mg total) by mouth daily.  30 tablet  1  . colchicine 0.6 MG tablet Take 1 tablet (0.6 mg total) by mouth 2 (two) times daily.  14 tablet  0  . doxazosin (CARDURA) 4 MG tablet Take 2 mg by mouth at bedtime.      . fenofibrate 160 MG tablet Take 160 mg by mouth at bedtime.      . insulin glargine (LANTUS) 100 UNIT/ML injection Inject 0.8 mLs (80 Units total) into the skin at bedtime.  10 mL  2  . metoprolol tartrate (LOPRESSOR) 25 MG tablet Take 1 tablet (25 mg total) by mouth 2 (two) times daily.  60 tablet  3  . nitroGLYCERIN (NITROSTAT) 0.4 MG SL tablet Place 1 tablet (0.4 mg total) under the tongue every 5 (five) minutes x 3 doses as needed for chest pain.  25 tablet  3  . pantoprazole (PROTONIX) 40 MG tablet Take 40 mg by mouth at bedtime.      . torsemide (DEMADEX) 20 MG tablet Take 20-40 mg by mouth 2 (two) times daily. Two tablets in the morning and one tablet at bedtime      .  traMADol (ULTRAM) 50 MG tablet Take 1 tablet (50 mg total) by mouth every 6 (six) hours as needed for pain.  24 tablet  2  . valsartan (DIOVAN) 320 MG tablet Take 320 mg by mouth at bedtime.       No current facility-administered medications for this visit.    Past Medical History  Diagnosis Date  . Arteriosclerotic cardiovascular disease (ASCVD)     a. 05/2011 Cath/PCI: LM nl, LAD 58m, D1 small, D2 small 49m, LCX large 40p, RCA 50-60p, 99 hazy @ origin of PDA with 70-80 in PDA (2.5x26 Resolute Integrity & 3.0x15 Resolute Integrity DES).;  b. 08/2012 Inflat  STEMI/Cath/PCI: LM minor irregs, LAD 50p, D1 50, LCX nl, OM1 25, RCA 30-40p, 100d (treated with 2.75x4mm Promus Premier DES);  c. 08/2012 Echo: EF 55-60%, basal inferopost HK.  [name] Hyperlipidemia   . Diabetes mellitus     Peripheral neuropathy  . Bell palsy   . Hypertension   . COPD (chronic  obstructive pulmonary disease)   . Sleep apnea   . Gallstones   . Cholelithiasis 07/2012    Asymptomatic; identified incidentally  . PONV (postoperative nausea and vomiting)   . Anxiety   . C. difficile colitis     a. 08/2012  . Myocardial infarct 09/08/12  . Nephrolithiasis   . Contrast dye induced nephropathy     a. 08/2012 post cath/pci  . CHF (congestive heart failure)   . Asthma   . Heart disease   . Old myocardial infarction     Past Surgical History  Procedure Laterality Date  . Circumcision    . Stents    . Esophagogastroduodenoscopy      in [place] over 20 yrs ago  . Colonoscopy      In [facility], approximately 2011 per patient, was normal. Advised to come back in 10 years.  . Esophagogastroduodenoscopy  06/12/2012    Procedure: ESOPHAGOGASTRODUODENOSCOPY (EGD);  Surgeon: Daneil Dolin, MD;  [facility];  Service: Endoscopy;  Laterality: N/A;  9:45  . Cardiac catheterization    . Subxyphoid pericardial window N/A 06/23/2013    Procedure: SUBXYPHOID PERICARDIAL WINDOW;  Surgeon: Grace Isaac, MD;  Location: [facility];  Service: Thoracic;  Laterality: N/A;  . Intraoperative transesophageal echocardiogram N/A 06/23/2013    Procedure: INTRAOPERATIVE TRANSESOPHAGEAL ECHOCARDIOGRAM;  Surgeon: Grace Isaac, MD;  Location: [facility];  Service: Open Heart Surgery;  Laterality: N/A;    ROS: Review of systems complete and found to be negative unless listed above  PHYSICAL EXAM BP 94/60  Pulse 77  Ht 6\' 1"  (1.854 m)  Wt 250 lb (113.399 kg)  BMI 32.99 kg/m2 General: Well developed, well nourished, in no acute distress Head: Eyes PERRLA, No xanthomas.   Normal cephalic and atramatic  Lungs: Clear bilaterally with mild expiratory wheezes in the left base. Heart: HRRR S1 S2, without MRG.  Pulses are 2+ & equal.            No carotid bruit. No JVD.  No abdominal bruits. No femoral bruits. Abdomen: Bowel sounds are positive, abdomen soft and  non-tender without masses or                  Hernia's noted. Msk:  Back normal, normal gait. Normal strength and tone for age. Extremities: No clubbing, cyanosis or 1+ left pre-tibal edema.  DP +1 Neuro: Alert and oriented X 3. Psych:  Good affect, responds appropriately    ASSESSMENT AND PLAN

## 2013-07-07 NOTE — Patient Instructions (Signed)
Your physician recommends that you schedule a follow-up appointment in: one month with [place]  Your physician has recommended you make the following change in your medication:   Decease Demadex to 20 mg twice a day.  Decrease Diovan to 80 mg daily   Your physician recommends that you return for lab work as soon as possible Artist)

## 2013-07-07 NOTE — Assessment & Plan Note (Signed)
BP is low today. Dropping in from 112/56 to 27/60 in the office with orthostatics. Will decrease diovan to 80 mg daily. Will have BMET drawn to evaluate kidney fx.

## 2013-07-07 NOTE — Assessment & Plan Note (Signed)
No evidence of fluid overload or edema. Breathing status is stable. Will decrease demedex to 20 mg BID to avoid dehydration and hypotension, as he is hypotensive on this visit.

## 2013-07-14 ENCOUNTER — Telehealth: Payer: Self-pay | Admitting: Family Medicine

## 2013-07-14 DIAGNOSIS — E785 Hyperlipidemia, unspecified: Secondary | ICD-10-CM

## 2013-07-14 DIAGNOSIS — Z79899 Other long term (current) drug therapy: Secondary | ICD-10-CM

## 2013-07-14 NOTE — Progress Notes (Signed)
Cardiac Rehabilitation Program Outcomes Report   Orientation:  10/14/2012 Graduate Date:  Na Discharge Date:  12/29/2012 # of sessions completed: 23 DX: Stent X 1 and MI  Cardiologist: Lattie Haw Family MD:  Luking,Scott Class Time:  11:00  A.  Exercise Program:  Tolerates exercise @ 3.89 METS for 15 minutes Pre walk test: Rest HR 72, BP 104/66, O 2 99%, RPE 7 and RPD 7. 6 min HR 91, BP 120/60, O2 98%, RPE 13, and  RPD 9, Post HR 77, BP 110/70, O2 100%, RPE 9 and RPD 9. Walked 1100 sg feet at 2.58mph at 2.6 METS. Did not do a post walk test did not finish the program. Started having trouble with his eyes.  B.  Mental Health:  Good mental attitude  C.  Education/Instruction/Skills  Accurately checks own pulse.  Rest:  89  Exercise: 101, Knows THR for exercise, Uses Perceived Exertion Scale and/or Dyspnea Scale and Attended 8 education classes  Uses Perceived Exertion Scale and/or Dyspnea Scale  D.  Nutrition/Weight Control/Body Composition:  Adherence to prescribed nutrition program: good    E.  Blood Lipids    Lab Results  Component Value Date   CHOL 241* 05/11/2013   HDL 35* 05/11/2013   LDLCALC Comment:   Not calculated due to Triglyceride >400. Suggest ordering Direct LDL (Unit Code: 3376641378).   Total Cholesterol/HDL Ratio:CHD Risk                        Coronary Heart Disease Risk Table                                        Men       Women          1/2 Average Risk              3.4        3.3              Average Risk              5.0        4.4           2X Average Risk              9.6        7.1           3X Average Risk             23.4       11.0 Use the calculated Patient Ratio above and the CHD Risk table  to determine the patient's CHD Risk. ATP III Classification (LDL):       < 100        mg/dL         Optimal      100 - 129     mg/dL         Near or Above Optimal      130 - 159     mg/dL         Borderline High      160 - 189     mg/dL         High        > 190        mg/dL         Very High   05/11/2013   TRIG  621* 05/11/2013   CHOLHDL 6.9 05/11/2013    F.  Lifestyle Changes:  Making positive lifestyle changes  G.  Symptoms noted with exercise:  Asymptomatic  Report Completed By:  Oletta Lamas. [place] Yearby RN   Comments:  This is patients discharge report. The patient had trouble with his eyesight and could not finish program. He attended 23 sessions out of 36.[name] His resting HR was 89 and BP was 148/70, His peak Hr was 101 and BP was 130/70. We will contact the patient at 1 month, 6 months and 1 year.

## 2013-07-14 NOTE — Telephone Encounter (Signed)
Patient needs Blood Work before December 9th appointment

## 2013-07-14 NOTE — Addendum Note (Signed)
Encounter addended by: Norlene Duel, RN on: 07/14/2013 11:59 AM<BR>     Documentation filed: Clinical Notes

## 2013-07-14 NOTE — Addendum Note (Signed)
Encounter addended by: Norlene Duel, RN on: 07/14/2013 11:58 AM<BR>     Documentation filed: Notes Section

## 2013-07-15 ENCOUNTER — Ambulatory Visit: Payer: PRIVATE HEALTH INSURANCE | Admitting: Family Medicine

## 2013-07-15 NOTE — Telephone Encounter (Signed)
Lipid profile, metabolic 7. Reason renal insufficiency, hyperlipidemia. He should consider moving his appointment to somewhere around December 15 16th or 17th. That would be 3 months since the last hemoglobin A1c. Medicare will not allow for a hemoglobin A1c to be checked for 3 months. I am sure that I have openings that would accommodate those dates. Please talk with the patient regarding this hopefully he will agree. If he states he needs to keep the appointment on the ninth we will not do hemoglobin A1c on this visit thank you.

## 2013-07-16 ENCOUNTER — Ambulatory Visit (INDEPENDENT_AMBULATORY_CARE_PROVIDER_SITE_OTHER): Payer: Self-pay | Admitting: Cardiothoracic Surgery

## 2013-07-16 ENCOUNTER — Ambulatory Visit
Admission: RE | Admit: 2013-07-16 | Discharge: 2013-07-16 | Disposition: A | Discharge status: Home / Self Care | Payer: PRIVATE HEALTH INSURANCE | Source: Ambulatory Visit | Attending: Cardiothoracic Surgery | Admitting: Cardiothoracic Surgery

## 2013-07-16 ENCOUNTER — Encounter: Payer: Self-pay | Admitting: Cardiothoracic Surgery

## 2013-07-16 VITALS — BP 159/95 | HR 90 | Resp 20 | Ht 73.0 in | Wt 250.0 lb

## 2013-07-16 DIAGNOSIS — Z09 Encounter for follow-up examination after completed treatment for conditions other than malignant neoplasm: Secondary | ICD-10-CM

## 2013-07-16 DIAGNOSIS — I313 Pericardial effusion (noninflammatory): Secondary | ICD-10-CM

## 2013-07-16 DIAGNOSIS — I319 Disease of pericardium, unspecified: Secondary | ICD-10-CM

## 2013-07-16 NOTE — Patient Instructions (Signed)
Fluid showed no cancer or infection   Pericardial Effusion Pericardial effusion is an accumulation of extra fluid in the pericardial space. This is the space between the heart and the sac that surrounds it. This space normally contains a small amount of fluid which serves as lubrication for the heart inside the sac. If the amount of fluid increases, it causes problems for the working of the heart. This is called a heart (cardiac) tamponade.  CAUSES  A higher incidence of pericardial effusion is associated with certain diseases. Some common causes of pericardial effusion are:  Infections (bacterial, viral, fungal, from AIDS, etc.).  Cancer which has spread to the pericardial sac.  Fluid accumulation in the sac after a heart attack or open heart surgery.  Injury (a fall with chest injury or as a result of a knife or gunshot wound) with bleeding into the pericardial sac.  Immune diseases (such as Rheumatoid Arthritis) and other arthritis conditions.  Reactions (uncommon) to medications. SYMPTOMS  Very small effusions may cause no problems. Even a small effusion if it comes on rapidly can be deadly. Some common symptoms are:  Chest pain, pressure, discomfort.  Light-headed feeling, fainting.  Cough, shortness of breath.  Feeling of palpitations.  Hiccoughs  Anxiety or confusion DIAGNOSIS  Your caregiver may do a number of blood tests and imaging tests (like X-rays) to help find the cause.   ECHO (Echocardiographic stress test) has become the diagnostic method of choice. This is due to its portability and availability. CT and MRI are also used, and may be more accurate.  Pericardioscopy, where available, uses a telescope-like instrument to look inside the pericardial sac.  This may be helpful in cases of unexplained pericardial effusions. It allows your caregiver to look at the pericardium and do pericardial biopsies if something abnormal is found.  Sometimes fluid may be  removed to help with diagnosing the cause of the accumulation. This is called a pericardiocentesis. TREATMENT  Treatment is directed at removal of the extra pericardial fluid and treating the cause. Small amounts of effusion that are not causing problems can simply be watched. If the fluid is accumulating rapidly and resulting in cardiac tamponade, this can be life-threatening. Emergency removal of fluid must be done.  SEEK IMMEDIATE MEDICAL CARE IF:   You develop chest pain, irregular heart beat (palpitations) or racing heart, shortness of breath, or begin sweating.  You become lightheaded or pass out.  You develop increasing swelling of the legs. Document Released: 04/10/2005 Document Revised: 11/05/2011 Document Reviewed: 03/26/2008 [facility] Patient Information 2014 [place].

## 2013-07-16 NOTE — Progress Notes (Signed)
[address]       [place] [address]             [place] Record [id number] Date of Birth: 29-Jun-1956  Fay Records, MD [name], MD  Chief Complaint:   PostOp Follow Up Visit 06/23/2013  PREOPERATIVE DIAGNOSIS: Large pericardial effusion.  POSTOPERATIVE DIAGNOSIS: Large pericardial effusion.  SURGICAL PROCEDURE: Transesophageal echo and subxiphoid pericardial  window with drainage of pericardial effusion.  SURGEON: Lanelle Bal, MD   History of Present Illness:     Patient returns to the office today in followup after drainage of large pericardial effusion. He notes that he is feeling much better. He did have some trouble with low blood pressure on his higher dose of ARB. He notes his glucose control has been much better since discharge. He has mild edema at the ankles but he notes this is much better than it had been in the past. He denies orthopnea.     History  Smoking status  . Never Smoker   Smokeless tobacco  . Not on file       Allergies  Allergen Reactions  . Hydrocodone Nausea And Vomiting  . Lisinopril Cough  . Neurontin [Gabapentin]     Side effects, suicidal thoughts  . Statins Other (See Comments)    Muscle aches    Current Outpatient Prescriptions  Medication Sig Dispense Refill  . ALPRAZolam (XANAX) 0.5 MG tablet Take one half to one tablet by mouth at bedtime as needed for sleep.  30 tablet  2  . aspirin EC 81 MG tablet Take 81 mg by mouth at bedtime.       . bisacodyl (DULCOLAX) 5 MG EC tablet Take 2 tablets (10 mg total) by mouth daily as needed for constipation.  30 tablet  0  . budesonide-formoterol (SYMBICORT) 160-4.5 MCG/ACT inhaler Inhale 2 puffs into the lungs 2 (two) times daily.      . clopidogrel (PLAVIX) 75 MG tablet Take 1 tablet (75 mg total) by mouth daily.  30 tablet  1  . colchicine 0.6 MG tablet Take 1 tablet (0.6 mg total) by mouth 2  (two) times daily.  14 tablet  0  . doxazosin (CARDURA) 4 MG tablet Take 2 mg by mouth at bedtime.      . fenofibrate 160 MG tablet Take 160 mg by mouth at bedtime.      . insulin glargine (LANTUS) 100 UNIT/ML injection Inject 0.8 mLs (80 Units total) into the skin at bedtime.  10 mL  2  . metoprolol tartrate (LOPRESSOR) 25 MG tablet Take 1 tablet (25 mg total) by mouth 2 (two) times daily.  60 tablet  3  . nitroGLYCERIN (NITROSTAT) 0.4 MG SL tablet Place 1 tablet (0.4 mg total) under the tongue every 5 (five) minutes x 3 doses as needed for chest pain.  25 tablet  3  . pantoprazole (PROTONIX) 40 MG tablet Take 40 mg by mouth at bedtime.      . torsemide (DEMADEX) 20 MG tablet Take 1 tablet (20 mg total) by mouth 2 (two) times daily.  90 tablet  3  . traMADol (ULTRAM) 50 MG tablet Take 1 tablet (50 mg total) by mouth every 6 (six) hours as needed for pain.  24 tablet  2  . valsartan (DIOVAN) 80 MG tablet Take 1 tablet (  80 mg total) by mouth daily.  90 tablet  3   No current facility-administered medications for this visit.       Physical Exam: BP 159/95  Pulse 90  Resp 20  Ht 6\' 1"  (1.854 m)  Wt 250 lb (113.399 kg)  BMI 32.99 kg/m2  SpO2 99%  General appearance: alert and cooperative Neurologic: intact Heart: regular rate and rhythm, S1, S2 normal, no murmur, click, rub or gallop and normal apical impulse Lungs: clear to auscultation bilaterally Abdomen: soft, non-tender; bowel sounds normal; no masses,  no organomegaly Extremities: extremities normal, atraumatic, no cyanosis or edema and Homans sign is negative, no sign of DVT Wound: Pericardial window incision is well-healed   Diagnostic Studies & Laboratory data:         Recent Radiology Findings: Dg Chest 2 View  07/16/2013   CLINICAL DATA:  History of pericardial window, followup  EXAM: CHEST  2 VIEW  COMPARISON:  Chest x-ray of 07/02/2013  FINDINGS: Aeration of the lungs has improved with resolution of the bibasilar  linear atelectasis noted previously. No pleural effusion is seen. Mediastinal contours are stable. Mild cardiomegaly is stable. There are degenerative changes throughout the thoracic spine.  IMPRESSION: Resolution of basilar atelectasis.  No active lung disease.   Electronically Signed   By: Ivar Drape M.D.   On: 07/16/2013 09:16   Ct Abdomen Pelvis Wo Contrast  06/18/2013   CLINICAL DATA:  Abdominal pain  EXAM: CT ABDOMEN AND PELVIS WITHOUT CONTRAST  TECHNIQUE: Multidetector CT imaging of the abdomen and pelvis was performed following the standard protocol without intravenous contrast.  COMPARISON:  CT 06/01/2012  FINDINGS: Lung bases are clear. Moderately large pericardial effusion measuring 21 mm in thickness. Coronary artery calcification.  Liver gallbladder and bile ducts are normal. Pancreas and spleen are normal. Kidneys show no obstruction mass or calculus.  Negative for bowel obstruction or bowel thickening. Mild sigmoid diverticulosis. Negative for mass or adenopathy. Mild prostate enlargement. Mild lumbar degenerative changes.  IMPRESSION: Moderately large pericardial effusion.  No acute abnormality in the abdomen.   Electronically Signed   By: Franchot Gallo M.D.   On: 06/18/2013 23:12    Ct Chest Without Contrast  06/22/2013   CLINICAL DATA:  Pericardial effusion.  EXAM: CT CHEST WITHOUT CONTRAST  TECHNIQUE: Multidetector CT imaging of the chest was performed following the standard protocol without IV contrast.  COMPARISON:  CT scan abdomen dated 06/18/2013 and CT scan of the chest dated 05/29/2011  FINDINGS: There is a moderate pericardial effusion, slightly increased since 06/18/2013. Heart size is normal. Coronary artery calcification and numerous coronary stents are noted. No hilar or mediastinal adenopathy. No pulmonary infiltrates. Slight atelectasis at the lung bases. No effusions. The visualized portion of the upper abdomen is normal. No acute osseous abnormality.  IMPRESSION: Moderate  pericardial effusion, slightly increased. The appearance is nonspecific.   Electronically Signed   By: Rozetta Nunnery M.D.   On: 06/22/2013 17:36     Recent Labs: Lab Results  Component Value Date   WBC 7.1 06/25/2013   HGB 10.5* 06/25/2013   HCT 30.0* 06/25/2013   PLT 149* 06/25/2013   GLUCOSE 194* 07/02/2013   CHOL 241* 05/11/2013   TRIG 621* 05/11/2013   HDL 35* 05/11/2013   LDLCALC Comment:   Not calculated due to Triglyceride >400. Suggest ordering Direct LDL (Unit Code: [phone]).   Total Cholesterol/HDL Ratio:CHD Risk  Coronary Heart Disease Risk Table                                        Men       Women          1/2 Average Risk              3.4        3.3              Average Risk              5.0        4.4           2X Average Risk              9.6        7.1           3X Average Risk             23.4       11.0 Use the calculated Patient Ratio above and the CHD Risk table  to determine the patient's CHD Risk. ATP III Classification (LDL):       < 100        mg/dL         Optimal      100 - 129     mg/dL         Near or Above Optimal      130 - 159     mg/dL         Borderline High      160 - 189     mg/dL         High       > 190        mg/dL         Very High   05/11/2013   ALT 9 06/25/2013   AST 14 06/25/2013   NA 134* 07/02/2013   K 5.0 07/02/2013   CL 99 07/02/2013   CREATININE 1.36* 07/02/2013   BUN 26* 07/02/2013   CO2 26 07/02/2013   TSH 1.714 05/11/2013   INR 1.00 06/22/2013   HGBA1C TEST NOT PERFORMED 05/11/2013   Past on the pericardial fluid showed negative cytology, cultures were negative   Assessment / Plan:    Continues to have difficult to control blood pressure  Stable following pericardial window and drainage of large pericardial effusion Continued follow up with cardiology Followup the echocardiogram has been scheduled for December by cardiology.  Monnica Saltsman B 07/16/2013 9:57 AM

## 2013-07-16 NOTE — Telephone Encounter (Signed)
Blood work ordered. [facility] (no voicemail available)

## 2013-07-17 NOTE — Telephone Encounter (Signed)
Discussed with patient.  Patient rescheduled office visit to the week of Dec 15th.

## 2013-07-21 LAB — FUNGUS CULTURE W SMEAR: Fungal Smear: NONE SEEN

## 2013-07-28 ENCOUNTER — Telehealth: Payer: Self-pay | Admitting: Family Medicine

## 2013-07-28 ENCOUNTER — Other Ambulatory Visit: Payer: Self-pay | Admitting: *Deleted

## 2013-07-28 MED ORDER — ALPRAZOLAM 0.5 MG PO TABS: ORAL_TABLET | ORAL | Status: DC

## 2013-07-28 NOTE — Telephone Encounter (Signed)
error 

## 2013-08-04 ENCOUNTER — Ambulatory Visit: Payer: PRIVATE HEALTH INSURANCE | Admitting: Family Medicine

## 2013-08-04 LAB — BASIC METABOLIC PANEL
CO2: 24 mEq/L (ref 19–32)
Chloride: 102 mEq/L (ref 96–112)
Creat: 1.29 mg/dL (ref 0.50–1.35)
Glucose, Bld: 239 mg/dL — ABNORMAL HIGH (ref 70–99)
Potassium: 5 mEq/L (ref 3.5–5.3)
Sodium: 135 mEq/L (ref 135–145)

## 2013-08-04 LAB — LIPID PANEL: Total CHOL/HDL Ratio: 7.7 Ratio

## 2013-08-05 LAB — AFB CULTURE WITH SMEAR (NOT AT ARMC): Acid Fast Smear: NONE SEEN

## 2013-08-06 ENCOUNTER — Other Ambulatory Visit: Payer: Self-pay | Admitting: *Deleted

## 2013-08-06 MED ORDER — INSULIN GLARGINE 100 UNIT/ML ~~LOC~~ SOLN: 80.0000 [IU] | Freq: Every day | SUBCUTANEOUS | Status: DC

## 2013-08-06 NOTE — Telephone Encounter (Signed)
Patient notified and verbalized understanding. He has an appt with [place] on Dec. 17 and he is bringing all his medications.

## 2013-08-06 NOTE — Telephone Encounter (Signed)
[facility] 12/11

## 2013-08-07 ENCOUNTER — Inpatient Hospital Stay (HOSPITAL_COMMUNITY): Admit: 2013-08-07 | Payer: Commercial Managed Care - HMO

## 2013-08-07 ENCOUNTER — Telehealth: Payer: Self-pay

## 2013-08-07 NOTE — Telephone Encounter (Signed)
Left message with wife for pt to call back top discuss need for simvastatin

## 2013-08-11 ENCOUNTER — Encounter: Payer: Self-pay | Admitting: Cardiovascular Disease

## 2013-08-11 NOTE — Telephone Encounter (Signed)
Pt cannot tolerate any statins and will fu with pcp tomorrow

## 2013-08-12 ENCOUNTER — Encounter: Payer: Self-pay | Admitting: Family Medicine

## 2013-08-12 ENCOUNTER — Ambulatory Visit (INDEPENDENT_AMBULATORY_CARE_PROVIDER_SITE_OTHER): Payer: Medicare PPO | Admitting: Family Medicine

## 2013-08-12 VITALS — BP 150/88 | Ht 73.0 in | Wt 263.8 lb

## 2013-08-12 DIAGNOSIS — G609 Hereditary and idiopathic neuropathy, unspecified: Secondary | ICD-10-CM

## 2013-08-12 DIAGNOSIS — G629 Polyneuropathy, unspecified: Secondary | ICD-10-CM

## 2013-08-12 DIAGNOSIS — I10 Essential (primary) hypertension: Secondary | ICD-10-CM

## 2013-08-12 DIAGNOSIS — E119 Type 2 diabetes mellitus without complications: Secondary | ICD-10-CM

## 2013-08-12 DIAGNOSIS — E785 Hyperlipidemia, unspecified: Secondary | ICD-10-CM

## 2013-08-12 LAB — POCT GLYCOSYLATED HEMOGLOBIN (HGB A1C): Hemoglobin A1C: 7.5

## 2013-08-12 NOTE — Progress Notes (Signed)
   Subjective:    Patient ID: Eugene [name], male    DOB: [birth year], 57 y.o.   MRN: [id number]  HPI Patient is here today for a check up. He got blood work done.  Pt not eating t right, using meds denies sx 25 min spent discussing meds, compliance, dm, sugars, exercie and wt loss His A1C was 7.5 pmh see prev notes    Review of Systems  Constitutional: Negative for activity change and appetite change.  HENT: Negative for congestion.   Respiratory: Negative for apnea, cough, choking and chest tightness.   Cardiovascular: Negative for chest pain.  Gastrointestinal: Negative for abdominal pain.  Genitourinary: Negative for dysuria.  Musculoskeletal: Positive for arthralgias and back pain.   See above    Objective:   Physical Exam  Constitutional: He appears well-developed and well-nourished.  HENT:  Head: Normocephalic and atraumatic.  Right Ear: External ear normal.  Left Ear: External ear normal.  Nose: Nose normal.  Mouth/Throat: Oropharynx is clear and moist.  Eyes: EOM are normal.  Neck: No thyromegaly present.  Cardiovascular: Normal rate, regular rhythm and normal heart sounds.   No murmur heard. Pulmonary/Chest: Effort normal and breath sounds normal. No respiratory distress. He has no wheezes.  Abdominal: Soft. Bowel sounds are normal. He exhibits no distension and no mass. There is no tenderness.  Musculoskeletal: Normal range of motion. He exhibits no edema.  Lymphadenopathy:    He has no cervical adenopathy.  Neurological: He is alert. He exhibits normal muscle tone.  Skin: Skin is warm and dry. No erythema.  Psychiatric: He has a normal mood and affect.          Assessment & Plan:  DM- subpar better diet and increase exercise Lipid- diet flax seed/ meds/ recheck 3 months HTN stable CAD stable

## 2013-08-12 NOTE — Patient Instructions (Signed)
Restart Faxseed oil twice a day  Lessen starches  Labs and OV in 3 months (April)

## 2013-08-18 ENCOUNTER — Ambulatory Visit (HOSPITAL_COMMUNITY)
Admission: RE | Admit: 2013-08-18 | Discharge: 2013-08-18 | Disposition: A | Discharge status: Home / Self Care | Payer: PRIVATE HEALTH INSURANCE | Source: Ambulatory Visit | Attending: Cardiothoracic Surgery | Admitting: Cardiothoracic Surgery

## 2013-08-18 DIAGNOSIS — I10 Essential (primary) hypertension: Secondary | ICD-10-CM | POA: Insufficient documentation

## 2013-08-18 DIAGNOSIS — I319 Disease of pericardium, unspecified: Secondary | ICD-10-CM

## 2013-08-18 DIAGNOSIS — I313 Pericardial effusion (noninflammatory): Secondary | ICD-10-CM

## 2013-08-18 DIAGNOSIS — E785 Hyperlipidemia, unspecified: Secondary | ICD-10-CM | POA: Insufficient documentation

## 2013-08-18 DIAGNOSIS — I2510 Atherosclerotic heart disease of native coronary artery without angina pectoris: Secondary | ICD-10-CM | POA: Insufficient documentation

## 2013-08-18 DIAGNOSIS — E119 Type 2 diabetes mellitus without complications: Secondary | ICD-10-CM | POA: Insufficient documentation

## 2013-08-18 NOTE — Progress Notes (Signed)
*  PRELIMINARY RESULTS* Echocardiogram 2D Echocardiogram has been performed.  [name], [place] 08/18/2013, 3:44 PM

## 2013-08-21 ENCOUNTER — Encounter: Payer: Self-pay | Admitting: Cardiovascular Disease

## 2013-08-21 ENCOUNTER — Ambulatory Visit (INDEPENDENT_AMBULATORY_CARE_PROVIDER_SITE_OTHER): Payer: PRIVATE HEALTH INSURANCE | Admitting: Cardiovascular Disease

## 2013-08-21 VITALS — BP 165/92 | HR 78 | Ht 73.0 in | Wt 258.0 lb

## 2013-08-21 DIAGNOSIS — R0609 Other forms of dyspnea: Secondary | ICD-10-CM

## 2013-08-21 DIAGNOSIS — R600 Localized edema: Secondary | ICD-10-CM

## 2013-08-21 DIAGNOSIS — R609 Edema, unspecified: Secondary | ICD-10-CM

## 2013-08-21 DIAGNOSIS — I319 Disease of pericardium, unspecified: Secondary | ICD-10-CM

## 2013-08-21 DIAGNOSIS — I2510 Atherosclerotic heart disease of native coronary artery without angina pectoris: Secondary | ICD-10-CM

## 2013-08-21 DIAGNOSIS — I5032 Chronic diastolic (congestive) heart failure: Secondary | ICD-10-CM

## 2013-08-21 DIAGNOSIS — I10 Essential (primary) hypertension: Secondary | ICD-10-CM

## 2013-08-21 DIAGNOSIS — E782 Mixed hyperlipidemia: Secondary | ICD-10-CM

## 2013-08-21 DIAGNOSIS — I313 Pericardial effusion (noninflammatory): Secondary | ICD-10-CM

## 2013-08-21 MED ORDER — ISOSORBIDE MONONITRATE ER 30 MG PO TB24: 30.0000 mg | ORAL_TABLET | Freq: Every day | ORAL | Status: DC

## 2013-08-21 NOTE — Patient Instructions (Signed)
   Begin Imdur 30mg  daily - new sent to pharm Continue all other medications.   Your physician has requested that you regularly monitor and record your blood pressure readings at home.  Please check 4-5 x per week at varied times over the next month.  Bring to next office visit for MD review. Follow up in  4-6 weeks

## 2013-08-21 NOTE — Progress Notes (Signed)
Patient ID: Eugene [name], male   DOB: 04-15-1956, 57 y.o.   MRN: [id number]      SUBJECTIVE:  The patient is a 57 yo male w/ PMHx s/f IDDM, CAD (s/p DES-RCA x 2 in 05/2011, DES-RCA in 08/2012 in setting of STEMI), HTN, HLD, obesity, OSA, COPD and GERD.  He was hospitalized for chest pain this past October and was noted to have a moderate to large size pericardial effusion. He underwent a sub-xiphoid pericardial window draining 400 cc of nonbloody fluid. It was posteriorly located. His P2Y12 level was noted to be elevated and he was switched from Effient to [place]. However, because he could not afford this, he was then switched to Plavix. His most recent echocardiogram performed 3 days ago revealed a small residual pericardial effusion with normal left ventricular systolic function, which I personally reviewed. He cannot tolerate statins and was instructed by his PCP to modify his diet and engage in regular physical activity to assist with lipid control, and was also started on flaxseed oil. Due to hypotension and symptoms of orthostasis, his doses of Demadex and Diovan were reduced in November.  He has reduced his sodium intake considerably. He denies chest pain altogether. He continues to get dyspneic with exertion. He has never smoked. He tells me he was diagnosed with COPD by Dr. Luan Pulling. However, he denies a history of asthma or emphysema. His leg swelling has gone down considerably as well. He tells me his blood pressure fluctuates quite a bit.   Allergies  Allergen Reactions  . Hydrocodone Nausea And Vomiting  . Lisinopril Cough  . Neurontin [Gabapentin]     Side effects, suicidal thoughts  . Statins Other (See Comments)    Muscle aches  . Metformin And Related     Intestinal side effects    Current Outpatient Prescriptions  Medication Sig Dispense Refill  . ALPRAZolam (XANAX) 0.5 MG tablet Take one half to one tablet by mouth at bedtime as needed for sleep.  30 tablet  2  .  aspirin EC 81 MG tablet Take 81 mg by mouth at bedtime.       . bisacodyl (DULCOLAX) 5 MG EC tablet Take 2 tablets (10 mg total) by mouth daily as needed for constipation.  30 tablet  0  . budesonide-formoterol (SYMBICORT) 160-4.5 MCG/ACT inhaler Inhale 2 puffs into the lungs 2 (two) times daily.      . clopidogrel (PLAVIX) 75 MG tablet Take 1 tablet (75 mg total) by mouth daily.  30 tablet  1  . doxazosin (CARDURA) 4 MG tablet Take 2 mg by mouth at bedtime.      . fenofibrate 160 MG tablet Take 160 mg by mouth at bedtime.      . Flaxseed, Linseed, (FLAXSEED OIL) 1000 MG CAPS Take 1,000 mg by mouth daily.      . insulin glargine (LANTUS) 100 UNIT/ML injection Inject 0.8 mLs (80 Units total) into the skin at bedtime.  10 mL  1  . metoprolol tartrate (LOPRESSOR) 25 MG tablet Take 1 tablet (25 mg total) by mouth 2 (two) times daily.  60 tablet  3  . nitroGLYCERIN (NITROSTAT) 0.4 MG SL tablet Place 1 tablet (0.4 mg total) under the tongue every 5 (five) minutes x 3 doses as needed for chest pain.  25 tablet  3  . pantoprazole (PROTONIX) 40 MG tablet Take 40 mg by mouth 2 (two) times daily.       [name] torsemide (DEMADEX) 20 MG tablet Take  20-40 mg by mouth 2 (two) times daily. Take 2 tablets in AM and take 1 tablet in PM      . traMADol (ULTRAM) 50 MG tablet Take 1 tablet (50 mg total) by mouth every 6 (six) hours as needed for pain.  24 tablet  2  . valsartan (DIOVAN) 80 MG tablet Take 1 tablet (80 mg total) by mouth daily.  90 tablet  3  . colchicine 0.6 MG tablet Take 1 tablet (0.6 mg total) by mouth 2 (two) times daily.  14 tablet  0   No current facility-administered medications for this visit.    Past Medical History  Diagnosis Date  . Arteriosclerotic cardiovascular disease (ASCVD)     a. 05/2011 Cath/PCI: LM nl, LAD 57m, D1 small, D2 small 22m, LCX large 40p, RCA 50-60p, 99 hazy @ origin of PDA with 70-80 in PDA (2.5x26 Resolute Integrity & 3.0x15 Resolute Integrity DES).;  b. 08/2012 Inflat   STEMI/Cath/PCI: LM minor irregs, LAD 50p, D1 50, LCX nl, OM1 25, RCA 30-40p, 100d (treated with 2.75x10mm Promus Premier DES);  c. 08/2012 Echo: EF 55-60%, basal inferopost HK.  [name] Hyperlipidemia   . Diabetes mellitus     Peripheral neuropathy  . Bell palsy   . Hypertension   . COPD (chronic obstructive pulmonary disease)   . Sleep apnea   . Gallstones   . Cholelithiasis 07/2012    Asymptomatic; identified incidentally  . PONV (postoperative nausea and vomiting)   . Anxiety   . C. difficile colitis     a. 08/2012  . Myocardial infarct 09/08/12  . Nephrolithiasis   . Contrast dye induced nephropathy     a. 08/2012 post cath/pci  . CHF (congestive heart failure)   . Asthma   . Heart disease   . Old myocardial infarction     Past Surgical History  Procedure Laterality Date  . Circumcision    . Stents    . Esophagogastroduodenoscopy      in [place] over 20 yrs ago  . Colonoscopy      In [facility], approximately 2011 per patient, was normal. Advised to come back in 10 years.  . Esophagogastroduodenoscopy  06/12/2012    Procedure: ESOPHAGOGASTRODUODENOSCOPY (EGD);  Surgeon: Daneil Dolin, MD;  [facility];  Service: Endoscopy;  Laterality: N/A;  9:45  . Cardiac catheterization    . Subxyphoid pericardial window N/A 06/23/2013    Procedure: SUBXYPHOID PERICARDIAL WINDOW;  Surgeon: Grace Isaac, MD;  Location: [place];  Service: Thoracic;  Laterality: N/A;  . Intraoperative transesophageal echocardiogram N/A 06/23/2013    Procedure: INTRAOPERATIVE TRANSESOPHAGEAL ECHOCARDIOGRAM;  Surgeon: Grace Isaac, MD;  Location: [place];  Service: Open Heart Surgery;  Laterality: N/A;    History   Social History  . Marital Status: Married    Spouse Name: N/A    Number of Children: 2  . Years of Education: N/A   Occupational History  . Shipping     [employer]   Social History Main Topics  . Smoking status: Never Smoker   . Smokeless tobacco: Not  on file  . Alcohol Use: No     Comment: heavy etoh use 30 years ago  . Drug Use: No  . Sexual Activity: Yes    Birth Control/ Protection: None   Other Topics Concern  . 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 Disabled at this point.  Filed Vitals:   08/21/13 1111  BP: 165/92  Pulse: 78  Height: 6\' 1"  (1.854 m)  Weight: 258 lb (117.028 kg)    PHYSICAL EXAM General: NAD Neck: No JVD, no thyromegaly or thyroid nodule.  Lungs: Clear to auscultation bilaterally with normal respiratory effort. CV: Nondisplaced PMI.  Heart regular S1/S2, no S3/S4, no murmur.  Trace leg edema.  No carotid bruit.  Normal pedal pulses.  Abdomen: Soft, nontender, no hepatosplenomegaly, no distention.  Neurologic: Alert and oriented x 3.  Psych: Normal affect. Extremities: No clubbing or cyanosis.   ECG: reviewed and available in electronic records.  Echo (08-18-13): - Left ventricle: Diastolic dysfunction is noted. The cavity size was normal. Wall thickness was increased in a pattern of moderate LVH. Systolic function was normal. The estimated ejection fraction was in the range of 60% to 65%. Wall motion was normal; there were no regional wall motion abnormalities. - Left atrium: The atrium was mildly dilated. - Systemic veins: IVC is not dilated. - Pericardium, extracardiac: A small pericardial effusion is noted. There is no evidence of tamponade physiology. Impressions: - A repeat limited echo to assess pericardial effusion is recommended as deemed clinically indicated.      ASSESSMENT AND PLAN: 1. CAD s/p PCI: He appears to be symptomatically stable for the most part. I wonder if his dyspnea on exertion is due to coronary artery disease. I will empirically start him on isosorbide mononitrate 30 mg daily. Continue aspirin, metoprolol, and Plavix. 2. HTN: uncontrolled today, but it appears to fluctuate. I have asked the patient to  check blood pressure readings 4-5 times per week, at different times throughout the day, in order to get a better approximation of mean BP values. These results will be provided to me at the end of that period so that I can determine if antihypertensive medication titration is indicated. 3. Chronic diastolic heart failure: appears compensated. A low-sodium diet was emphasized, as was the importance of BP control. No changes to current diuretic regimen. 4. Hyperlipidemia: intolerant to statins (muscle aches). Continue flaxseed oil, fenofibrate, and therapeutic lifestyle changes. 5. Pericardial effusion s/p pericardial window: small by echo on 08/18/13. Continue to monitor.  Dispo: f/u 4-6 weeks.  Kate Sable, M.D., F.A.C.C.

## 2013-08-24 ENCOUNTER — Ambulatory Visit: Payer: PRIVATE HEALTH INSURANCE | Admitting: Family Medicine

## 2013-08-25 ENCOUNTER — Telehealth: Payer: Self-pay | Admitting: Cardiovascular Disease

## 2013-08-25 NOTE — Telephone Encounter (Signed)
Mr. Hirsh states that the Imdur 30mg  daily is making him dizzy.

## 2013-08-25 NOTE — Telephone Encounter (Signed)
Stated he started the Imdur on 08/21/13.  States he does have a little dull headache also.  The dizzy / lightheadedness feeling is very bothersome.  Advised to decrease to 1/2 tab (15mg ) for now & call back in 1 week to give update on how doing.  Informed pt that message will also be sent to MD for review in the meantime.  Patient has follow up scheduled for 09/18/2013 with Dr. Bronson Ing.

## 2013-08-31 NOTE — Telephone Encounter (Signed)
Could consider short-acting isosorbide dinitrate, 10 mg tid. Would leave up to patient.

## 2013-09-01 NOTE — Telephone Encounter (Signed)
Patient notified.  Stated he did stop the Imdur on 08/29/2013.  Stated med was making his chest feel funny / chest pain.  Stated he is feeling much better now that he has totally stopped medication.  States that he prefers to not try anything else now, but will discuss further at his upcoming OV on 09/18/13 with Dr. Bronson Ing.

## 2013-09-12 ENCOUNTER — Encounter (HOSPITAL_COMMUNITY): Payer: Self-pay | Admitting: Emergency Medicine

## 2013-09-12 ENCOUNTER — Emergency Department (HOSPITAL_COMMUNITY): Payer: PRIVATE HEALTH INSURANCE

## 2013-09-12 ENCOUNTER — Emergency Department (HOSPITAL_COMMUNITY)
Admission: EM | Admit: 2013-09-12 | Discharge: 2013-09-12 | Disposition: A | Discharge status: Home / Self Care | Payer: PRIVATE HEALTH INSURANCE | Attending: Emergency Medicine | Admitting: Emergency Medicine

## 2013-09-12 DIAGNOSIS — F411 Generalized anxiety disorder: Secondary | ICD-10-CM | POA: Insufficient documentation

## 2013-09-12 DIAGNOSIS — E119 Type 2 diabetes mellitus without complications: Secondary | ICD-10-CM | POA: Insufficient documentation

## 2013-09-12 DIAGNOSIS — Z87442 Personal history of urinary calculi: Secondary | ICD-10-CM | POA: Insufficient documentation

## 2013-09-12 DIAGNOSIS — R635 Abnormal weight gain: Secondary | ICD-10-CM | POA: Insufficient documentation

## 2013-09-12 DIAGNOSIS — Z8669 Personal history of other diseases of the nervous system and sense organs: Secondary | ICD-10-CM | POA: Insufficient documentation

## 2013-09-12 DIAGNOSIS — I252 Old myocardial infarction: Secondary | ICD-10-CM | POA: Insufficient documentation

## 2013-09-12 DIAGNOSIS — Z8673 Personal history of transient ischemic attack (TIA), and cerebral infarction without residual deficits: Secondary | ICD-10-CM | POA: Insufficient documentation

## 2013-09-12 DIAGNOSIS — J45901 Unspecified asthma with (acute) exacerbation: Principal | ICD-10-CM

## 2013-09-12 DIAGNOSIS — R609 Edema, unspecified: Secondary | ICD-10-CM | POA: Insufficient documentation

## 2013-09-12 DIAGNOSIS — I509 Heart failure, unspecified: Secondary | ICD-10-CM | POA: Insufficient documentation

## 2013-09-12 DIAGNOSIS — Z8619 Personal history of other infectious and parasitic diseases: Secondary | ICD-10-CM | POA: Insufficient documentation

## 2013-09-12 DIAGNOSIS — E785 Hyperlipidemia, unspecified: Secondary | ICD-10-CM | POA: Insufficient documentation

## 2013-09-12 DIAGNOSIS — J441 Chronic obstructive pulmonary disease with (acute) exacerbation: Secondary | ICD-10-CM | POA: Insufficient documentation

## 2013-09-12 DIAGNOSIS — R0600 Dyspnea, unspecified: Secondary | ICD-10-CM

## 2013-09-12 DIAGNOSIS — Z79899 Other long term (current) drug therapy: Secondary | ICD-10-CM | POA: Insufficient documentation

## 2013-09-12 DIAGNOSIS — Z7902 Long term (current) use of antithrombotics/antiplatelets: Secondary | ICD-10-CM | POA: Insufficient documentation

## 2013-09-12 DIAGNOSIS — Z7982 Long term (current) use of aspirin: Secondary | ICD-10-CM | POA: Insufficient documentation

## 2013-09-12 DIAGNOSIS — I10 Essential (primary) hypertension: Secondary | ICD-10-CM | POA: Insufficient documentation

## 2013-09-12 DIAGNOSIS — Z95818 Presence of other cardiac implants and grafts: Secondary | ICD-10-CM | POA: Insufficient documentation

## 2013-09-12 DIAGNOSIS — Z8719 Personal history of other diseases of the digestive system: Secondary | ICD-10-CM | POA: Insufficient documentation

## 2013-09-12 DIAGNOSIS — M7989 Other specified soft tissue disorders: Secondary | ICD-10-CM | POA: Insufficient documentation

## 2013-09-12 DIAGNOSIS — Z794 Long term (current) use of insulin: Secondary | ICD-10-CM | POA: Insufficient documentation

## 2013-09-12 LAB — CBC WITH DIFFERENTIAL/PLATELET
BASOS ABS: 0 10*3/uL (ref 0.0–0.1)
BASOS PCT: 0 % (ref 0–1)
Eosinophils Absolute: 0.1 10*3/uL (ref 0.0–0.7)
Eosinophils Relative: 2 % (ref 0–5)
HCT: 29.9 % — ABNORMAL LOW (ref 39.0–52.0)
Hemoglobin: 11 g/dL — ABNORMAL LOW (ref 13.0–17.0)
LYMPHS ABS: 2 10*3/uL (ref 0.7–4.0)
Lymphocytes Relative: 34 % (ref 12–46)
MCH: 32.4 pg (ref 26.0–34.0)
MCHC: 36.8 g/dL — ABNORMAL HIGH (ref 30.0–36.0)
MCV: 87.9 fL (ref 78.0–100.0)
Monocytes Absolute: 0.5 10*3/uL (ref 0.1–1.0)
Monocytes Relative: 8 % (ref 3–12)
NEUTROS PCT: 56 % (ref 43–77)
Neutro Abs: 3.3 10*3/uL (ref 1.7–7.7)
Platelets: 177 10*3/uL (ref 150–400)
RBC: 3.4 MIL/uL — AB (ref 4.22–5.81)
RDW: 13.1 % (ref 11.5–15.5)
WBC: 6 10*3/uL (ref 4.0–10.5)

## 2013-09-12 LAB — BASIC METABOLIC PANEL
BUN: 46 mg/dL — ABNORMAL HIGH (ref 6–23)
CALCIUM: 9.7 mg/dL (ref 8.4–10.5)
CO2: 26 mEq/L (ref 19–32)
Chloride: 98 mEq/L (ref 96–112)
Creatinine, Ser: 1.54 mg/dL — ABNORMAL HIGH (ref 0.50–1.35)
GFR calc non Af Amer: 49 mL/min — ABNORMAL LOW (ref >90)
GFR, EST AFRICAN AMERICAN: 57 mL/min — AB (ref >90)
GLUCOSE: 207 mg/dL — AB (ref 70–99)
POTASSIUM: 4.3 meq/L (ref 3.7–5.3)
SODIUM: 137 meq/L (ref 137–147)

## 2013-09-12 LAB — TROPONIN I

## 2013-09-12 LAB — PRO B NATRIURETIC PEPTIDE: PRO B NATRI PEPTIDE: 213.9 pg/mL — AB (ref 0–125)

## 2013-09-12 MED ORDER — FUROSEMIDE 10 MG/ML IJ SOLN
80.0000 mg | Freq: Once | INTRAMUSCULAR | Status: AC
  Administered 2013-09-12: 80 mg via INTRAVENOUS
  Filled 2013-09-12: qty 8

## 2013-09-12 MED ORDER — VALSARTAN 160 MG PO TABS: 160.0000 mg | ORAL_TABLET | Freq: Every day | ORAL | Status: DC

## 2013-09-12 NOTE — ED Provider Notes (Signed)
CSN: [id number]     Arrival date & time 09/12/13  1635 History  This chart was scribed for Virgel Manifold, MD by Jenne Campus, ED Scribe. This patient was seen in room APA05/APA05 and the patient's care was started at 5:42 PM.   Chief Complaint  Patient presents with  . Shortness of Breath    The history is provided by the patient. No language interpreter was used.    HPI Comments: Eugene [name] is a 58 y.o. male with a h/o CHF who presents to the Emergency Department complaining of gradually worsening of chronic SOB for the past 3 to 4 days with associated increased bilateral lower leg swelling and a 10 lb weight gain noted in the past 2 days. He also reports central CP that started in the ED. He describes the pain as "nagging" with episodes lasting 2 minutes. He denies any known triggers for the pain. He denies any noticeable changes in the SOB during the episodes. He states that he has chronic orthopnea and denies any changes. He admits that he weighs himself regularly but has not been able to due to guests in his house for the past week. He also admits to increasing his own lasix dose 3 days ago from twice daily to 3 doses daily. He states that he has been urinating more frequently without any noticeable lower leg changes. He has a h/o MI with stent placement in January 2014 as well as a pericardial window placement in October 2014. He has reports a recent echocardiogram performed at [facility] that was normal. He also has a h/o HTN and states that his pressures have been in the 140s over 70s within the past few days. He denies any h/o PE/DVT. He has a h/o neuropathy but denies any changes.   Seen by Cards recently PCP is Dr. [name]   Past Medical History  Diagnosis Date  . Arteriosclerotic cardiovascular disease (ASCVD)     a. 05/2011 Cath/PCI: LM nl, LAD 65m, D1 small, D2 small 87m, LCX large 40p, RCA 50-60p, 99 hazy @ origin of PDA with 70-80 in PDA (2.5x26 Resolute Integrity &  3.0x15 Resolute Integrity DES).;  b. 08/2012 Inflat  STEMI/Cath/PCI: LM minor irregs, LAD 50p, D1 50, LCX nl, OM1 25, RCA 30-40p, 100d (treated with 2.75x94mm Promus Premier DES);  c. 08/2012 Echo: EF 55-60%, basal inferopost HK.  [name] Hyperlipidemia   . Diabetes mellitus     Peripheral neuropathy  . Bell palsy   . Hypertension   . COPD (chronic obstructive pulmonary disease)   . Sleep apnea   . Gallstones   . Cholelithiasis 07/2012    Asymptomatic; identified incidentally  . PONV (postoperative nausea and vomiting)   . Anxiety   . C. difficile colitis     a. 08/2012  . Myocardial infarct 09/08/12  . Nephrolithiasis   . Contrast dye induced nephropathy     a. 08/2012 post cath/pci  . CHF (congestive heart failure)   . Asthma   . Heart disease   . Old myocardial infarction    Past Surgical History  Procedure Laterality Date  . Circumcision    . Stents    . Esophagogastroduodenoscopy      in [place] over 20 yrs ago  . Colonoscopy      In [facility], approximately 2011 per patient, was normal. Advised to come back in 10 years.  . Esophagogastroduodenoscopy  06/12/2012    Procedure: ESOPHAGOGASTRODUODENOSCOPY (EGD);  Surgeon: Daneil Dolin, MD;  [facility];  Service: Endoscopy;  Laterality: N/A;  9:45  . Cardiac catheterization    . Subxyphoid pericardial window N/A 06/23/2013    Procedure: SUBXYPHOID PERICARDIAL WINDOW;  Surgeon: Grace Isaac, MD;  Location: [facility];  Service: Thoracic;  Laterality: N/A;  . Intraoperative transesophageal echocardiogram N/A 06/23/2013    Procedure: INTRAOPERATIVE TRANSESOPHAGEAL ECHOCARDIOGRAM;  Surgeon: Grace Isaac, MD;  Location: [place];  Service: Open Heart Surgery;  Laterality: N/A;   Family History  Problem Relation Age of Onset  . Diabetes Mother   . Heart attack Mother   . Stroke Mother   . Diabetes Sister   . Sleep apnea Sister   . Hypertension Brother   . Diabetes Brother   . Colon cancer Neg Hx   .  Liver disease Neg Hx   . Diabetes Brother   . Hypertension Brother    History  Substance Use Topics  . Smoking status: Never Smoker   . Smokeless tobacco: Not on file  . Alcohol Use: No     Comment: heavy etoh use 30 years ago    Review of Systems  Constitutional: Positive for unexpected weight change. Negative for fever.  Respiratory: Positive for cough and shortness of breath.   Cardiovascular: Positive for chest pain and leg swelling.  Gastrointestinal: Negative for nausea and vomiting.  All other systems reviewed and are negative.    Allergies  Hydrocodone; Lisinopril; Neurontin; Statins; and Metformin and related  Home Medications   Current Outpatient Rx  Name  Route  Sig  Dispense  Refill  . ALPRAZolam (XANAX) 0.5 MG tablet      Take one half to one tablet by mouth at bedtime as needed for sleep.   30 tablet   2   . aspirin EC 81 MG tablet   Oral   Take 81 mg by mouth at bedtime.          . bisacodyl (DULCOLAX) 5 MG EC tablet   Oral   Take 2 tablets (10 mg total) by mouth daily as needed for constipation.   30 tablet   0   . budesonide-formoterol (SYMBICORT) 160-4.5 MCG/ACT inhaler   Inhalation   Inhale 2 puffs into the lungs 2 (two) times daily.         . clopidogrel (PLAVIX) 75 MG tablet   Oral   Take 1 tablet (75 mg total) by mouth daily.   30 tablet   1   . EXPIRED: colchicine 0.6 MG tablet   Oral   Take 1 tablet (0.6 mg total) by mouth 2 (two) times daily.   14 tablet   0   . doxazosin (CARDURA) 4 MG tablet   Oral   Take 2 mg by mouth at bedtime.         . fenofibrate 160 MG tablet   Oral   Take 160 mg by mouth at bedtime.         . Flaxseed, Linseed, (FLAXSEED OIL) 1000 MG CAPS   Oral   Take 1,000 mg by mouth daily.         . insulin glargine (LANTUS) 100 UNIT/ML injection   Subcutaneous   Inject 0.8 mLs (80 Units total) into the skin at bedtime.   10 mL   1     PLease give lantus solostar pen. With one refill   .  metoprolol tartrate (LOPRESSOR) 25 MG tablet   Oral   Take 1 tablet (25 mg total) by mouth 2 (  two) times daily.   60 tablet   3   . nitroGLYCERIN (NITROSTAT) 0.4 MG SL tablet   Sublingual   Place 1 tablet (0.4 mg total) under the tongue every 5 (five) minutes x 3 doses as needed for chest pain.   25 tablet   3   . pantoprazole (PROTONIX) 40 MG tablet   Oral   Take 40 mg by mouth 2 (two) times daily.          [name] torsemide (DEMADEX) 20 MG tablet   Oral   Take 20-40 mg by mouth 2 (two) times daily. Take 2 tablets in AM and take 1 tablet in PM         . traMADol (ULTRAM) 50 MG tablet   Oral   Take 1 tablet (50 mg total) by mouth every 6 (six) hours as needed for pain.   24 tablet   2   . valsartan (DIOVAN) 80 MG tablet   Oral   Take 1 tablet (80 mg total) by mouth daily.   90 tablet   3    Triage Vitals: BP 179/81  Temp(Src) 98.9 F (37.2 C) (Oral)  Resp 20  Ht 6\' 1"  (1.854 m)  Wt 258 lb (117.028 kg)  BMI 34.05 kg/m2  SpO2 99%  Physical Exam  Nursing note and vitals reviewed. Constitutional: He is oriented to person, place, and time. He appears well-developed and well-nourished. No distress.  HENT:  Head: Normocephalic and atraumatic.  Eyes: EOM are normal.  Neck: Normal range of motion. Neck supple.  Cardiovascular: Normal rate, regular rhythm, normal heart sounds and intact distal pulses.   Pulmonary/Chest: Effort normal. No respiratory distress.  Diminished breath sounds bilaterally  Abdominal: Soft. He exhibits no distension. There is no tenderness.  Musculoskeletal: Normal range of motion. He exhibits edema (symmetric, severe pitting edema to the lower extremities ). He exhibits no tenderness (no calf tenderness ).  Neurological: He is alert and oriented to person, place, and time.  Skin: Skin is warm and dry.  Psychiatric: He has a normal mood and affect. Judgment normal.    ED Course  Procedures (including critical care time)  DIAGNOSTIC  STUDIES: Oxygen Saturation is 99% on RA, normal by my interpretation.    COORDINATION OF CARE: 5:50 PM-Advised pt that his symptoms appear to be consistent with a CHF exacerbation. Discussed treatment plan which includes CXR, CBC panel, BMP and troponin with pt at bedside and pt agreed to plan.   Labs Review Labs Reviewed  CBC WITH DIFFERENTIAL - Abnormal; Notable for the following:    RBC 3.40 (*)    Hemoglobin 11.0 (*)    HCT 29.9 (*)    MCHC 36.8 (*)    All other components within normal limits  BASIC METABOLIC PANEL - Abnormal; Notable for the following:    Glucose, Bld 207 (*)    BUN 46 (*)    Creatinine, Ser 1.54 (*)    GFR calc non Af Amer 49 (*)    GFR calc Af Amer 57 (*)    All other components within normal limits  PRO B NATRIURETIC PEPTIDE - Abnormal; Notable for the following:    Pro B Natriuretic peptide (BNP) 213.9 (*)    All other components within normal limits  TROPONIN I   Imaging Review No results found.  Dg Chest 2 View  09/12/2013   CLINICAL DATA:  Shortness of breath. Bilateral lower extremity swelling.  EXAM: CHEST  2 VIEW  COMPARISON:  Two-view  chest 07/16/2013.  FINDINGS: The heart is enlarged. Under vascular congestion has increased. No focal airspace consolidation is evident. The visualized soft tissues and bony thorax are unremarkable.  IMPRESSION: 1. Cardiomegaly with progressive pulmonary vascular congestion. 2. No focal airspace disease.   Electronically Signed   By: Lawrence Santiago M.D.   On: 09/12/2013 18:09   EKG Interpretation    Date/Time:  Saturday September 12 2013 17:04:34 EST Ventricular Rate:  81 PR Interval:  178 QRS Duration: 102 QT Interval:  358 QTC Calculation: 415 R Axis:   12 Text Interpretation:  Normal sinus rhythm Incomplete right bundle branch block Inferior infarct , age undetermined Anterior infarct , age undetermined Abnormal ECG When compared with ECG of 18-Jun-2013 20:49, Anterior infarct is now Present No significant  change was found ED PHYSICIAN INTERPRETATION AVAILABLE IN [facility] Confirmed by TEST, RECORD ([id number]) on 09/14/2013 11:37:43 AM            MDM   1. Dyspnea    58 year old male with dyspnea. Suspect secondary to some fluid overload. He is in no distress on exam. Oxygen saturations are good. Doubt infectious. Doubt ACS. On patient's temporarily increase his Lasix. Return precautions were discussed. Needs to followup with either PCP or his cardiologist. I personally preformed the services scribed in my presence. The recorded information has been reviewed is accurate. Virgel Manifold, MD.    Virgel Manifold, MD 09/15/13 [id number]

## 2013-09-12 NOTE — ED Notes (Signed)
Worsening sob x 3-4 days.  10lb weight gain x 2 days.  Fluid retention to lower extremities.  Reports central cp starting upon arrival here.  Also reports non-productive cough, denies fever/n/v.

## 2013-09-12 NOTE — Discharge Instructions (Signed)
Increase your diovan to 160mg  daily. Increase your demadex to 40mg  twice a day.

## 2013-09-18 ENCOUNTER — Ambulatory Visit (INDEPENDENT_AMBULATORY_CARE_PROVIDER_SITE_OTHER): Payer: PRIVATE HEALTH INSURANCE | Admitting: Cardiovascular Disease

## 2013-09-18 VITALS — BP 184/103 | HR 87 | Ht 73.0 in | Wt 265.0 lb

## 2013-09-18 DIAGNOSIS — I10 Essential (primary) hypertension: Secondary | ICD-10-CM

## 2013-09-18 DIAGNOSIS — Z955 Presence of coronary angioplasty implant and graft: Secondary | ICD-10-CM

## 2013-09-18 DIAGNOSIS — I3139 Other pericardial effusion (noninflammatory): Secondary | ICD-10-CM

## 2013-09-18 DIAGNOSIS — I5033 Acute on chronic diastolic (congestive) heart failure: Secondary | ICD-10-CM

## 2013-09-18 DIAGNOSIS — R600 Localized edema: Secondary | ICD-10-CM

## 2013-09-18 DIAGNOSIS — R609 Edema, unspecified: Secondary | ICD-10-CM

## 2013-09-18 DIAGNOSIS — Z79899 Other long term (current) drug therapy: Secondary | ICD-10-CM

## 2013-09-18 DIAGNOSIS — I509 Heart failure, unspecified: Secondary | ICD-10-CM

## 2013-09-18 DIAGNOSIS — E782 Mixed hyperlipidemia: Secondary | ICD-10-CM

## 2013-09-18 DIAGNOSIS — R0609 Other forms of dyspnea: Secondary | ICD-10-CM

## 2013-09-18 DIAGNOSIS — I2510 Atherosclerotic heart disease of native coronary artery without angina pectoris: Secondary | ICD-10-CM

## 2013-09-18 DIAGNOSIS — I319 Disease of pericardium, unspecified: Secondary | ICD-10-CM

## 2013-09-18 DIAGNOSIS — Z9861 Coronary angioplasty status: Secondary | ICD-10-CM

## 2013-09-18 DIAGNOSIS — N183 Chronic kidney disease, stage 3 unspecified: Secondary | ICD-10-CM

## 2013-09-18 DIAGNOSIS — I313 Pericardial effusion (noninflammatory): Secondary | ICD-10-CM

## 2013-09-18 DIAGNOSIS — G4733 Obstructive sleep apnea (adult) (pediatric): Secondary | ICD-10-CM

## 2013-09-18 DIAGNOSIS — R0989 Other specified symptoms and signs involving the circulatory and respiratory systems: Secondary | ICD-10-CM

## 2013-09-18 NOTE — Patient Instructions (Signed)
   Lab for BMET  24 hour blood pressure monitor - will go to [facility] for placement Office will contact with results via phone or letter.   Continue all current medications. Follow up in  1 month

## 2013-09-18 NOTE — Progress Notes (Signed)
Patient ID: Eugene [name], male   DOB: 06/13/1956, 58 y.o.   MRN: [id number]      SUBJECTIVE: At his last office visit, I started the patient on indoor but this made him feel dizzy and gave him an uncomfortable sensation in his chest, and thus he stopped taking it. He then presented to the emergency room on January 17 with increasing dyspnea on exertion and lower extremity swelling, for which he increased his Lasix dose on his own. His chest x-ray did show pulmonary vascular congestion and his blood pressure was noted to be elevated. Since last weekend, his increased his Demadex from 20 mg twice daily to 40 mg twice daily. His dyspnea on exertion has resolved considerably, as has his lower extremity swelling. He has noticed that he's been drinking nearly a case of diet green tea and each bottle contains 170 mg of sodium, and he has been drinking anywhere from 3-4 bottles daily. He realized this last night after finishing another bottle. He denies chest pain. He has been checking his blood pressure at home regularly, and systolic readings have been in the 120 -130 mmHg range with occasional readings in the 140 mmHg range. His blood pressure cuff was purchased one year ago.  BUN/creatinine 46/1.54 on 1/17, previously 40/1.29 on 08/04/13.    Allergies  Allergen Reactions  . Hydrocodone Nausea And Vomiting  . Lisinopril Cough  . Neurontin [Gabapentin]     Side effects, suicidal thoughts  . Statins Other (See Comments)    Muscle aches  . Metformin And Related     Intestinal side effects    Current Outpatient Prescriptions  Medication Sig Dispense Refill  . ALPRAZolam (XANAX) 0.5 MG tablet Take one half to one tablet by mouth at bedtime as needed for sleep.  30 tablet  2  . aspirin EC 81 MG tablet Take 81 mg by mouth at bedtime.       . bisacodyl (DULCOLAX) 5 MG EC tablet Take 2 tablets (10 mg total) by mouth daily as needed for constipation.  30 tablet  0  . clopidogrel (PLAVIX) 75 MG  tablet Take 1 tablet (75 mg total) by mouth daily.  30 tablet  1  . doxazosin (CARDURA) 4 MG tablet Take 2 mg by mouth at bedtime.      . fenofibrate 160 MG tablet Take 160 mg by mouth at bedtime.      . Flaxseed, Linseed, (FLAXSEED OIL) 1000 MG CAPS Take 1,000 mg by mouth daily.      . Insulin Glargine (LANTUS SOLOSTAR) 100 UNIT/ML Solostar Pen Inject 80 Units into the skin at bedtime.      . metoprolol tartrate (LOPRESSOR) 25 MG tablet Take 1 tablet (25 mg total) by mouth 2 (two) times daily.  60 tablet  3  . nitroGLYCERIN (NITROSTAT) 0.4 MG SL tablet Place 1 tablet (0.4 mg total) under the tongue every 5 (five) minutes x 3 doses as needed for chest pain.  25 tablet  3  . torsemide (DEMADEX) 20 MG tablet Take 20-40 mg by mouth 2 (two) times daily. Take 2 tablets in AM and take 1 tablet in PM      . valsartan (DIOVAN) 80 MG tablet Take 1 tablet (80 mg total) by mouth daily.  90 tablet  3   No current facility-administered medications for this visit.    Past Medical History  Diagnosis Date  . Arteriosclerotic cardiovascular disease (ASCVD)     a. 05/2011 Cath/PCI: LM nl, LAD 73m,  D1 small, D2 small 39m, LCX large 40p, RCA 50-60p, 99 hazy @ origin of PDA with 70-80 in PDA (2.5x26 Resolute Integrity & 3.0x15 Resolute Integrity DES).;  b. 08/2012 Inflat  STEMI/Cath/PCI: LM minor irregs, LAD 50p, D1 50, LCX nl, OM1 25, RCA 30-40p, 100d (treated with 2.75x25mm Promus Premier DES);  c. 08/2012 Echo: EF 55-60%, basal inferopost HK.  [name] Hyperlipidemia   . Diabetes mellitus     Peripheral neuropathy  . Bell palsy   . Hypertension   . COPD (chronic obstructive pulmonary disease)   . Sleep apnea   . Gallstones   . Cholelithiasis 07/2012    Asymptomatic; identified incidentally  . PONV (postoperative nausea and vomiting)   . Anxiety   . C. difficile colitis     a. 08/2012  . Myocardial infarct 09/08/12  . Nephrolithiasis   . Contrast dye induced nephropathy     a. 08/2012 post cath/pci  . CHF  (congestive heart failure)   . Asthma   . Heart disease   . Old myocardial infarction     Past Surgical History  Procedure Laterality Date  . Circumcision    . Stents    . Esophagogastroduodenoscopy      in [place] over 20 yrs ago  . Colonoscopy      In [facility], approximately 2011 per patient, was normal. Advised to come back in 10 years.  . Esophagogastroduodenoscopy  06/12/2012    Procedure: ESOPHAGOGASTRODUODENOSCOPY (EGD);  Surgeon: Daneil Dolin, MD;  [facility];  Service: Endoscopy;  Laterality: N/A;  9:45  . Cardiac catheterization    . Subxyphoid pericardial window N/A 06/23/2013    Procedure: SUBXYPHOID PERICARDIAL WINDOW;  Surgeon: Grace Isaac, MD;  Location: [place];  Service: Thoracic;  Laterality: N/A;  . Intraoperative transesophageal echocardiogram N/A 06/23/2013    Procedure: INTRAOPERATIVE TRANSESOPHAGEAL ECHOCARDIOGRAM;  Surgeon: Grace Isaac, MD;  Location: [place];  Service: Open Heart Surgery;  Laterality: N/A;    History   Social History  . Marital Status: Married    Spouse Name: N/A    Number of Children: 2  . Years of Education: N/A   Occupational History  . Shipping     [employer]   Social History Main Topics  . Smoking status: Never Smoker   . Smokeless tobacco: Not on file  . Alcohol Use: No     Comment: heavy etoh use 30 years ago  . Drug Use: No  . Sexual Activity: Yes    Birth Control/ Protection: None   Other Topics Concern  . 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 Disabled at this point.     Filed Vitals:   09/18/13 1359 09/18/13 1403 09/18/13 1405  BP:  179/97 184/103  Pulse:  87   Height: 6\' 1"  (1.854 m)    Weight: 265 lb (120.203 kg)    SpO2:  98%     PHYSICAL EXAM General: NAD Neck: No JVD, no thyromegaly or thyroid nodule.  Lungs: Clear to auscultation bilaterally with normal respiratory effort. CV: Nondisplaced PMI.   Heart regular S1/S2, no S3/S4, no murmur.  No peripheral edema.  No carotid bruit.  Normal pedal pulses.  Abdomen: Soft, nontender, no hepatosplenomegaly, no distention.  Neurologic: Alert and oriented x 3.  Psych: Normal affect. Extremities: No clubbing or cyanosis.   ECG: reviewed and available in electronic records.      ASSESSMENT  AND PLAN: 1. CAD s/p PCI: He appears to be symptomatically stable. Continue aspirin, metoprolol, and Plavix.  2. HTN: uncontrolled today, as it was at his last visit. Given the wide disparity in readings from home compared to his office visits, I will proceed with an ambulatory BP monitor to see if his valsartan dose needs to be increased.  3. Acute on chronic diastolic heart failure: has resolved considerably with increased Demadex dosing. A low-sodium diet was emphasized, as was the importance of BP control. I have told him to reduce his torsemide from 40 mg bid to 20 mg bid (normal maintenance dosing) and I will check a BMET. On 1/17, BUN/creatinine 46/1.54. It appears diet green tea consumption also contributed to his most recent decompensation.  4. Hyperlipidemia: intolerant to statins (muscle aches). Continue flaxseed oil, fenofibrate, and therapeutic lifestyle changes.  5. Pericardial effusion s/p pericardial window: small by echo on 08/18/13. Continue to monitor.   Dispo: f/u 4-6 weeks.    Kate Sable, M.D., F.A.C.C.

## 2013-09-22 ENCOUNTER — Ambulatory Visit (HOSPITAL_COMMUNITY)
Admission: RE | Admit: 2013-09-22 | Discharge: 2013-09-22 | Disposition: A | Discharge status: Home / Self Care | Payer: Medicare HMO | Source: Ambulatory Visit | Attending: Cardiovascular Disease | Admitting: Cardiovascular Disease

## 2013-09-22 ENCOUNTER — Other Ambulatory Visit: Payer: Self-pay | Admitting: Cardiovascular Disease

## 2013-09-22 DIAGNOSIS — J449 Chronic obstructive pulmonary disease, unspecified: Secondary | ICD-10-CM | POA: Insufficient documentation

## 2013-09-22 DIAGNOSIS — J4489 Other specified chronic obstructive pulmonary disease: Secondary | ICD-10-CM | POA: Insufficient documentation

## 2013-09-22 DIAGNOSIS — R42 Dizziness and giddiness: Secondary | ICD-10-CM | POA: Insufficient documentation

## 2013-09-22 DIAGNOSIS — E119 Type 2 diabetes mellitus without complications: Secondary | ICD-10-CM | POA: Insufficient documentation

## 2013-09-22 DIAGNOSIS — I10 Essential (primary) hypertension: Secondary | ICD-10-CM | POA: Insufficient documentation

## 2013-09-22 NOTE — Progress Notes (Signed)
24 hour Ambulatory Blood Pressure Monitor in progress.

## 2013-09-23 LAB — BASIC METABOLIC PANEL
BUN: 40 mg/dL — AB (ref 6–23)
CHLORIDE: 98 meq/L (ref 96–112)
CO2: 22 meq/L (ref 19–32)
CREATININE: 1.44 mg/dL — AB (ref 0.50–1.35)
Calcium: 9.3 mg/dL (ref 8.4–10.5)
GLUCOSE: 287 mg/dL — AB (ref 70–99)
Potassium: 4.7 mEq/L (ref 3.5–5.3)
Sodium: 130 mEq/L — ABNORMAL LOW (ref 135–145)

## 2013-09-24 ENCOUNTER — Telehealth: Payer: Self-pay

## 2013-09-24 DIAGNOSIS — I10 Essential (primary) hypertension: Secondary | ICD-10-CM

## 2013-09-24 NOTE — Telephone Encounter (Signed)
Mailed lab slip to pt.

## 2013-10-09 ENCOUNTER — Telehealth: Payer: Self-pay

## 2013-10-09 MED ORDER — VALSARTAN 80 MG PO TABS: ORAL_TABLET | ORAL | Status: DC

## 2013-10-09 NOTE — Telephone Encounter (Signed)
After review of ambulatory BP report,Dr.Koneswaran increased Valsartan to 80 mg am and 40 mg pm  Pt aware,will change rx to reflect increase dose

## 2013-10-10 ENCOUNTER — Other Ambulatory Visit: Payer: Self-pay | Admitting: Cardiology

## 2013-10-15 ENCOUNTER — Emergency Department (HOSPITAL_COMMUNITY): Payer: Medicare HMO

## 2013-10-15 ENCOUNTER — Encounter (HOSPITAL_COMMUNITY): Payer: Self-pay | Admitting: Emergency Medicine

## 2013-10-15 ENCOUNTER — Observation Stay (HOSPITAL_COMMUNITY)
Admission: EM | Admit: 2013-10-15 | Discharge: 2013-10-17 | Disposition: A | Discharge status: Home / Self Care | Payer: Medicare HMO | Attending: Internal Medicine | Admitting: Internal Medicine

## 2013-10-15 DIAGNOSIS — R0989 Other specified symptoms and signs involving the circulatory and respiratory systems: Principal | ICD-10-CM | POA: Insufficient documentation

## 2013-10-15 DIAGNOSIS — G4733 Obstructive sleep apnea (adult) (pediatric): Secondary | ICD-10-CM | POA: Insufficient documentation

## 2013-10-15 DIAGNOSIS — N183 Chronic kidney disease, stage 3 unspecified: Secondary | ICD-10-CM | POA: Insufficient documentation

## 2013-10-15 DIAGNOSIS — E669 Obesity, unspecified: Secondary | ICD-10-CM | POA: Insufficient documentation

## 2013-10-15 DIAGNOSIS — R0600 Dyspnea, unspecified: Secondary | ICD-10-CM

## 2013-10-15 DIAGNOSIS — K219 Gastro-esophageal reflux disease without esophagitis: Secondary | ICD-10-CM | POA: Diagnosis present

## 2013-10-15 DIAGNOSIS — R0609 Other forms of dyspnea: Principal | ICD-10-CM | POA: Insufficient documentation

## 2013-10-15 DIAGNOSIS — I2510 Atherosclerotic heart disease of native coronary artery without angina pectoris: Secondary | ICD-10-CM | POA: Insufficient documentation

## 2013-10-15 DIAGNOSIS — K8020 Calculus of gallbladder without cholecystitis without obstruction: Secondary | ICD-10-CM | POA: Insufficient documentation

## 2013-10-15 DIAGNOSIS — R079 Chest pain, unspecified: Secondary | ICD-10-CM | POA: Insufficient documentation

## 2013-10-15 DIAGNOSIS — Z7902 Long term (current) use of antithrombotics/antiplatelets: Secondary | ICD-10-CM | POA: Insufficient documentation

## 2013-10-15 DIAGNOSIS — I252 Old myocardial infarction: Secondary | ICD-10-CM | POA: Insufficient documentation

## 2013-10-15 DIAGNOSIS — G609 Hereditary and idiopathic neuropathy, unspecified: Secondary | ICD-10-CM | POA: Insufficient documentation

## 2013-10-15 DIAGNOSIS — R140 Abdominal distension (gaseous): Secondary | ICD-10-CM

## 2013-10-15 DIAGNOSIS — E1143 Type 2 diabetes mellitus with diabetic autonomic (poly)neuropathy: Secondary | ICD-10-CM | POA: Diagnosis present

## 2013-10-15 DIAGNOSIS — E1149 Type 2 diabetes mellitus with other diabetic neurological complication: Secondary | ICD-10-CM | POA: Insufficient documentation

## 2013-10-15 DIAGNOSIS — I5032 Chronic diastolic (congestive) heart failure: Secondary | ICD-10-CM | POA: Diagnosis not present

## 2013-10-15 DIAGNOSIS — N039 Chronic nephritic syndrome with unspecified morphologic changes: Secondary | ICD-10-CM

## 2013-10-15 DIAGNOSIS — E119 Type 2 diabetes mellitus without complications: Secondary | ICD-10-CM

## 2013-10-15 DIAGNOSIS — E1165 Type 2 diabetes mellitus with hyperglycemia: Secondary | ICD-10-CM | POA: Insufficient documentation

## 2013-10-15 DIAGNOSIS — E785 Hyperlipidemia, unspecified: Secondary | ICD-10-CM | POA: Insufficient documentation

## 2013-10-15 DIAGNOSIS — G629 Polyneuropathy, unspecified: Secondary | ICD-10-CM | POA: Diagnosis present

## 2013-10-15 DIAGNOSIS — Z9861 Coronary angioplasty status: Secondary | ICD-10-CM | POA: Insufficient documentation

## 2013-10-15 DIAGNOSIS — I509 Heart failure, unspecified: Secondary | ICD-10-CM | POA: Insufficient documentation

## 2013-10-15 DIAGNOSIS — I129 Hypertensive chronic kidney disease with stage 1 through stage 4 chronic kidney disease, or unspecified chronic kidney disease: Secondary | ICD-10-CM | POA: Insufficient documentation

## 2013-10-15 DIAGNOSIS — I3139 Other pericardial effusion (noninflammatory): Secondary | ICD-10-CM

## 2013-10-15 DIAGNOSIS — J449 Chronic obstructive pulmonary disease, unspecified: Secondary | ICD-10-CM | POA: Insufficient documentation

## 2013-10-15 DIAGNOSIS — I10 Essential (primary) hypertension: Secondary | ICD-10-CM

## 2013-10-15 DIAGNOSIS — Z6833 Body mass index (BMI) 33.0-33.9, adult: Secondary | ICD-10-CM | POA: Insufficient documentation

## 2013-10-15 DIAGNOSIS — K3184 Gastroparesis: Secondary | ICD-10-CM | POA: Insufficient documentation

## 2013-10-15 DIAGNOSIS — D631 Anemia in chronic kidney disease: Secondary | ICD-10-CM | POA: Insufficient documentation

## 2013-10-15 DIAGNOSIS — J4489 Other specified chronic obstructive pulmonary disease: Secondary | ICD-10-CM | POA: Insufficient documentation

## 2013-10-15 DIAGNOSIS — G510 Bell's palsy: Secondary | ICD-10-CM | POA: Insufficient documentation

## 2013-10-15 DIAGNOSIS — I313 Pericardial effusion (noninflammatory): Secondary | ICD-10-CM

## 2013-10-15 DIAGNOSIS — E1129 Type 2 diabetes mellitus with other diabetic kidney complication: Secondary | ICD-10-CM | POA: Insufficient documentation

## 2013-10-15 DIAGNOSIS — Z7982 Long term (current) use of aspirin: Secondary | ICD-10-CM | POA: Insufficient documentation

## 2013-10-15 DIAGNOSIS — F411 Generalized anxiety disorder: Secondary | ICD-10-CM | POA: Insufficient documentation

## 2013-10-15 DIAGNOSIS — I319 Disease of pericardium, unspecified: Secondary | ICD-10-CM | POA: Insufficient documentation

## 2013-10-15 DIAGNOSIS — R0602 Shortness of breath: Secondary | ICD-10-CM

## 2013-10-15 LAB — CBC WITH DIFFERENTIAL/PLATELET
Basophils Absolute: 0 10*3/uL (ref 0.0–0.1)
Basophils Relative: 1 % (ref 0–1)
EOS ABS: 0.1 10*3/uL (ref 0.0–0.7)
Eosinophils Relative: 2 % (ref 0–5)
HCT: 33 % — ABNORMAL LOW (ref 39.0–52.0)
HEMOGLOBIN: 11.8 g/dL — AB (ref 13.0–17.0)
LYMPHS ABS: 2.1 10*3/uL (ref 0.7–4.0)
Lymphocytes Relative: 33 % (ref 12–46)
MCH: 31.4 pg (ref 26.0–34.0)
MCHC: 35.8 g/dL (ref 30.0–36.0)
MCV: 87.8 fL (ref 78.0–100.0)
Monocytes Absolute: 0.6 10*3/uL (ref 0.1–1.0)
Monocytes Relative: 10 % (ref 3–12)
NEUTROS ABS: 3.6 10*3/uL (ref 1.7–7.7)
NEUTROS PCT: 54 % (ref 43–77)
Platelets: 203 10*3/uL (ref 150–400)
RBC: 3.76 MIL/uL — AB (ref 4.22–5.81)
RDW: 12.7 % (ref 11.5–15.5)
WBC: 6.5 10*3/uL (ref 4.0–10.5)

## 2013-10-15 LAB — HEPATIC FUNCTION PANEL
ALT: 24 U/L (ref 0–53)
AST: 24 U/L (ref 0–37)
Albumin: 3.5 g/dL (ref 3.5–5.2)
Alkaline Phosphatase: 65 U/L (ref 39–117)
Bilirubin, Direct: <0.2 mg/dL (ref 0.0–0.3)
TOTAL PROTEIN: 7.2 g/dL (ref 6.0–8.3)
Total Bilirubin: 0.3 mg/dL (ref 0.3–1.2)

## 2013-10-15 LAB — BASIC METABOLIC PANEL
BUN: 45 mg/dL — ABNORMAL HIGH (ref 6–23)
CO2: 26 mEq/L (ref 19–32)
Calcium: 9.7 mg/dL (ref 8.4–10.5)
Chloride: 98 mEq/L (ref 96–112)
Creatinine, Ser: 1.68 mg/dL — ABNORMAL HIGH (ref 0.50–1.35)
GFR calc Af Amer: 51 mL/min — ABNORMAL LOW (ref >90)
GFR, EST NON AFRICAN AMERICAN: 44 mL/min — AB (ref >90)
GLUCOSE: 200 mg/dL — AB (ref 70–99)
POTASSIUM: 4.4 meq/L (ref 3.7–5.3)
SODIUM: 136 meq/L — AB (ref 137–147)

## 2013-10-15 LAB — LIPASE, BLOOD: Lipase: 19 U/L (ref 11–59)

## 2013-10-15 LAB — TROPONIN I
Troponin I: <0.3 ng/mL (ref <0.30)
Troponin I: <0.3 ng/mL (ref <0.30)

## 2013-10-15 LAB — PRO B NATRIURETIC PEPTIDE: PRO B NATRI PEPTIDE: 221.3 pg/mL — AB (ref 0–125)

## 2013-10-15 MED ORDER — SODIUM CHLORIDE 0.9 % IJ SOLN
3.0000 mL | Freq: Two times a day (BID) | INTRAMUSCULAR | Status: DC
  Administered 2013-10-16 – 2013-10-17 (×2): 3 mL via INTRAVENOUS

## 2013-10-15 MED ORDER — ONDANSETRON HCL 4 MG PO TABS: 4.0000 mg | ORAL_TABLET | Freq: Four times a day (QID) | ORAL | Status: DC | PRN

## 2013-10-15 MED ORDER — ACETAMINOPHEN 650 MG RE SUPP: 650.0000 mg | Freq: Four times a day (QID) | RECTAL | Status: DC | PRN

## 2013-10-15 MED ORDER — INSULIN ASPART 100 UNIT/ML ~~LOC~~ SOLN
0.0000 [IU] | Freq: Three times a day (TID) | SUBCUTANEOUS | Status: DC
  Administered 2013-10-16: 1 [IU] via SUBCUTANEOUS
  Administered 2013-10-16 – 2013-10-17 (×4): 3 [IU] via SUBCUTANEOUS

## 2013-10-15 MED ORDER — TORSEMIDE 20 MG PO TABS
20.0000 mg | ORAL_TABLET | Freq: Two times a day (BID) | ORAL | Status: DC
  Administered 2013-10-16: 20 mg via ORAL
  Filled 2013-10-15: qty 1

## 2013-10-15 MED ORDER — SODIUM CHLORIDE 0.9 % IV SOLN: 250.0000 mL | INTRAVENOUS | Status: DC | PRN

## 2013-10-15 MED ORDER — INSULIN GLARGINE 100 UNIT/ML ~~LOC~~ SOLN
60.0000 [IU] | Freq: Every day | SUBCUTANEOUS | Status: DC
  Administered 2013-10-16: 60 [IU] via SUBCUTANEOUS
  Filled 2013-10-15 (×4): qty 0.6

## 2013-10-15 MED ORDER — SODIUM CHLORIDE 0.9 % IJ SOLN
3.0000 mL | Freq: Two times a day (BID) | INTRAMUSCULAR | Status: DC
  Administered 2013-10-15 – 2013-10-16 (×3): 3 mL via INTRAVENOUS

## 2013-10-15 MED ORDER — IRBESARTAN 75 MG PO TABS
75.0000 mg | ORAL_TABLET | Freq: Every day | ORAL | Status: DC
  Administered 2013-10-16: 75 mg via ORAL
  Filled 2013-10-15 (×2): qty 1

## 2013-10-15 MED ORDER — METOPROLOL TARTRATE 25 MG PO TABS
25.0000 mg | ORAL_TABLET | Freq: Two times a day (BID) | ORAL | Status: DC
  Administered 2013-10-15 – 2013-10-17 (×4): 25 mg via ORAL
  Filled 2013-10-15 (×4): qty 1

## 2013-10-15 MED ORDER — CLOPIDOGREL BISULFATE 75 MG PO TABS
75.0000 mg | ORAL_TABLET | Freq: Every day | ORAL | Status: DC
  Administered 2013-10-16 – 2013-10-17 (×2): 75 mg via ORAL
  Filled 2013-10-15 (×2): qty 1

## 2013-10-15 MED ORDER — ACETAMINOPHEN 325 MG PO TABS: 650.0000 mg | ORAL_TABLET | Freq: Four times a day (QID) | ORAL | Status: DC | PRN

## 2013-10-15 MED ORDER — DOXAZOSIN MESYLATE 2 MG PO TABS
2.0000 mg | ORAL_TABLET | Freq: Every day | ORAL | Status: DC
  Administered 2013-10-15 – 2013-10-16 (×2): 2 mg via ORAL
  Filled 2013-10-15 (×2): qty 1

## 2013-10-15 MED ORDER — SODIUM CHLORIDE 0.9 % IJ SOLN: 3.0000 mL | INTRAMUSCULAR | Status: DC | PRN

## 2013-10-15 MED ORDER — FUROSEMIDE 10 MG/ML IJ SOLN
60.0000 mg | Freq: Once | INTRAMUSCULAR | Status: AC
Start: 2013-10-15 — End: 2013-10-15
  Administered 2013-10-15: 60 mg via INTRAVENOUS
  Filled 2013-10-15: qty 6

## 2013-10-15 MED ORDER — ALPRAZOLAM 0.5 MG PO TABS: 0.5000 mg | ORAL_TABLET | Freq: Every evening | ORAL | Status: DC | PRN

## 2013-10-15 MED ORDER — DOCUSATE SODIUM 100 MG PO CAPS
100.0000 mg | ORAL_CAPSULE | Freq: Two times a day (BID) | ORAL | Status: DC
  Administered 2013-10-16 – 2013-10-17 (×3): 100 mg via ORAL
  Filled 2013-10-15 (×3): qty 1

## 2013-10-15 MED ORDER — INSULIN ASPART 100 UNIT/ML ~~LOC~~ SOLN
0.0000 [IU] | Freq: Every day | SUBCUTANEOUS | Status: DC
  Administered 2013-10-16: 2 [IU] via SUBCUTANEOUS

## 2013-10-15 MED ORDER — HEPARIN SODIUM (PORCINE) 5000 UNIT/ML IJ SOLN
5000.0000 [IU] | Freq: Three times a day (TID) | INTRAMUSCULAR | Status: DC
  Administered 2013-10-15 – 2013-10-17 (×6): 5000 [IU] via SUBCUTANEOUS
  Filled 2013-10-15 (×6): qty 1

## 2013-10-15 MED ORDER — ASPIRIN EC 81 MG PO TBEC
81.0000 mg | DELAYED_RELEASE_TABLET | Freq: Every day | ORAL | Status: DC
  Administered 2013-10-16: 81 mg via ORAL
  Filled 2013-10-15: qty 1

## 2013-10-15 MED ORDER — ONDANSETRON HCL 4 MG/2ML IJ SOLN: 4.0000 mg | Freq: Four times a day (QID) | INTRAMUSCULAR | Status: DC | PRN

## 2013-10-15 MED ORDER — CLOPIDOGREL BISULFATE 75 MG PO TABS
75.0000 mg | ORAL_TABLET | Freq: Once | ORAL | Status: AC
  Administered 2013-10-15: 75 mg via ORAL
  Filled 2013-10-15: qty 1

## 2013-10-15 MED ORDER — ASPIRIN 81 MG PO CHEW
324.0000 mg | CHEWABLE_TABLET | Freq: Once | ORAL | Status: AC
  Administered 2013-10-15: 324 mg via ORAL
  Filled 2013-10-15: qty 4

## 2013-10-15 NOTE — ED Provider Notes (Signed)
CSN: [id number]     Arrival date & time 10/15/13  1936 History   First MD Initiated Contact with Patient 10/15/13 1944     Chief Complaint  Patient presents with  . Chest Pain     (Consider location/radiation/quality/duration/timing/severity/associated sxs/prior Treatment) HPI Complains of anterior chest discomfort described as a fullness for the past 4 days. Accompanied by dyspnea on exertion for the past 4 days. Also complains of a fullness in his upper abdomen. Symptoms feel like "heart attack" he sat in the past. No treatment prior to coming here except for his usual medications. He has not taking any of his medications today. Shortness of breath causes him to cough and choke. Symptoms worse with exertion. States he can only walk 10-12 feet for the past 4 days. He can normally walk several 100 feet. Past Medical History  Diagnosis Date  . Arteriosclerotic cardiovascular disease (ASCVD)     a. 05/2011 Cath/PCI: LM nl, LAD 24m, D1 small, D2 small 30m, LCX large 40p, RCA 50-60p, 99 hazy @ origin of PDA with 70-80 in PDA (2.5x26 Resolute Integrity & 3.0x15 Resolute Integrity DES).;  b. 08/2012 Inflat  STEMI/Cath/PCI: LM minor irregs, LAD 50p, D1 50, LCX nl, OM1 25, RCA 30-40p, 100d (treated with 2.75x60mm Promus Premier DES);  c. 08/2012 Echo: EF 55-60%, basal inferopost HK.  [name] Hyperlipidemia   . Diabetes mellitus     Peripheral neuropathy  . Bell palsy   . Hypertension   . COPD (chronic obstructive pulmonary disease)   . Sleep apnea   . Gallstones   . Cholelithiasis 07/2012    Asymptomatic; identified incidentally  . PONV (postoperative nausea and vomiting)   . Anxiety   . C. difficile colitis     a. 08/2012  . Myocardial infarct 09/08/12  . Nephrolithiasis   . Contrast dye induced nephropathy     a. 08/2012 post cath/pci  . CHF (congestive heart failure)   . Asthma   . Heart disease   . Old myocardial infarction    Past Surgical History  Procedure Laterality Date  . Circumcision     . Stents    . Esophagogastroduodenoscopy      in [place] over 20 yrs ago  . Colonoscopy      In [facility], approximately 2011 per patient, was normal. Advised to come back in 10 years.  . Esophagogastroduodenoscopy  06/12/2012    Procedure: ESOPHAGOGASTRODUODENOSCOPY (EGD);  Surgeon: Daneil Dolin, MD;  [facility];  Service: Endoscopy;  Laterality: N/A;  9:45  . Cardiac catheterization    . Subxyphoid pericardial window N/A 06/23/2013    Procedure: SUBXYPHOID PERICARDIAL WINDOW;  Surgeon: Grace Isaac, MD;  Location: [facility];  Service: Thoracic;  Laterality: N/A;  . Intraoperative transesophageal echocardiogram N/A 06/23/2013    Procedure: INTRAOPERATIVE TRANSESOPHAGEAL ECHOCARDIOGRAM;  Surgeon: Grace Isaac, MD;  Location: [place];  Service: Open Heart Surgery;  Laterality: N/A;   Family History  Problem Relation Age of Onset  . Diabetes Mother   . Heart attack Mother   . Stroke Mother   . Diabetes Sister   . Sleep apnea Sister   . Hypertension Brother   . Diabetes Brother   . Colon cancer Neg Hx   . Liver disease Neg Hx   . Diabetes Brother   . Hypertension Brother    History  Substance Use Topics  . Smoking status: Never Smoker   . Smokeless tobacco: Not on file  . Alcohol Use: No  Comment: heavy etoh use 30 years ago    Review of Systems  Constitutional: Negative.   HENT: Negative.   Respiratory: Positive for cough and shortness of breath.   Cardiovascular: Positive for leg swelling.  Gastrointestinal: Positive for abdominal distention.  Musculoskeletal: Negative.   Skin: Negative.   Neurological: Negative.   Psychiatric/Behavioral: Negative.   All other systems reviewed and are negative.      Allergies  Hydrocodone; Lisinopril; Neurontin; Statins; and Metformin and related  Home Medications   Current Outpatient Rx  Name  Route  Sig  Dispense  Refill  . ALPRAZolam (XANAX) 0.5 MG tablet      Take one half to one  tablet by mouth at bedtime as needed for sleep.   30 tablet   2   . aspirin EC 81 MG tablet   Oral   Take 81 mg by mouth at bedtime.          . bisacodyl (DULCOLAX) 5 MG EC tablet   Oral   Take 2 tablets (10 mg total) by mouth daily as needed for constipation.   30 tablet   0   . clopidogrel (PLAVIX) 75 MG tablet   Oral   Take 1 tablet (75 mg total) by mouth daily.   30 tablet   1   . doxazosin (CARDURA) 4 MG tablet   Oral   Take 2 mg by mouth at bedtime.         [name] doxazosin (CARDURA) 4 MG tablet      TAKE 1 TABLET (4 MG TOTAL) BY MOUTH AT BEDTIME.   30 tablet   11   . fenofibrate 160 MG tablet   Oral   Take 160 mg by mouth at bedtime.         . Flaxseed, Linseed, (FLAXSEED OIL) 1000 MG CAPS   Oral   Take 1,000 mg by mouth daily.         . Insulin Glargine (LANTUS SOLOSTAR) 100 UNIT/ML Solostar Pen   Subcutaneous   Inject 80 Units into the skin at bedtime.         . metoprolol tartrate (LOPRESSOR) 25 MG tablet   Oral   Take 1 tablet (25 mg total) by mouth 2 (two) times daily.   60 tablet   3   . nitroGLYCERIN (NITROSTAT) 0.4 MG SL tablet   Sublingual   Place 1 tablet (0.4 mg total) under the tongue every 5 (five) minutes x 3 doses as needed for chest pain.   25 tablet   3   . torsemide (DEMADEX) 20 MG tablet   Oral   Take 20-40 mg by mouth 2 (two) times daily. Take 2 tablets in AM and take 1 tablet in PM         . valsartan (DIOVAN) 80 MG tablet      Take 80 mg am and 40 mg pm   135 tablet   3    BP 167/84  Pulse 88  Temp(Src) 97.9 F (36.6 C)  Resp 20  Ht 6\' 1"  (1.854 m)  Wt 268 lb (121.564 kg)  BMI 35.37 kg/m2  SpO2 97% Physical Exam  Nursing note and vitals reviewed. Constitutional: He appears well-developed and well-nourished.  HENT:  Head: Normocephalic and atraumatic.  Eyes: Conjunctivae are normal. Pupils are equal, round, and reactive to light.  Neck: Neck supple. No JVD present. No tracheal deviation present. No  thyromegaly present.  Cardiovascular: Normal rate and regular rhythm.   No murmur  heard. Pulmonary/Chest: Effort normal and breath sounds normal.  Abdominal: Soft. Bowel sounds are normal. He exhibits no distension. There is no tenderness.  Obese  Musculoskeletal: Normal range of motion. He exhibits tenderness. He exhibits no edema.  Trace pretibial pitting edema bilaterally  Neurological: He is alert. Coordination normal.  Skin: Skin is warm and dry. No rash noted.  Psychiatric: He has a normal mood and affect.    ED Course  Procedures (including critical care time) Labs Review Labs Reviewed  BASIC METABOLIC PANEL  CBC WITH DIFFERENTIAL  TROPONIN I  PRO B NATRIURETIC PEPTIDE   Imaging Review No results found.  EKG Interpretation    Date/Time:  Thursday October 15 2013 19:46:54 EST Ventricular Rate:  88 PR Interval:  174 QRS Duration: 102 QT Interval:  358 QTC Calculation: 433 R Axis:   19 Text Interpretation:  Normal sinus rhythm Incomplete right bundle branch block Inferior infarct (cited on or before 12-Sep-2013) Abnormal ECG When compared with ECG of 12-Sep-2013 17:04, Criteria for Anterior infarct are no longer Present Confirmed by Winfred Leeds  MD, Tandy Grawe ([id number]) on 10/15/2013 7:53:58 PM           Chest x-ray viewed by me Results for orders placed during the hospital encounter of [id number]  BASIC METABOLIC PANEL      Result Value Ref Range   Sodium 136 (*) 137 - 147 mEq/L   Potassium 4.4  3.7 - 5.3 mEq/L   Chloride 98  96 - 112 mEq/L   CO2 26  19 - 32 mEq/L   Glucose, Bld 200 (*) 70 - 99 mg/dL   BUN 45 (*) 6 - 23 mg/dL   Creatinine, Ser 1.68 (*) 0.50 - 1.35 mg/dL   Calcium 9.7  8.4 - 10.5 mg/dL   GFR calc non Af Amer 44 (*) >90 mL/min   GFR calc Af Amer 51 (*) >90 mL/min  CBC WITH DIFFERENTIAL      Result Value Ref Range   WBC 6.5  4.0 - 10.5 K/uL   RBC 3.76 (*) 4.22 - 5.81 MIL/uL   Hemoglobin 11.8 (*) 13.0 - 17.0 g/dL   HCT 33.0 (*) 39.0 - 52.0 %   MCV  87.8  78.0 - 100.0 fL   MCH 31.4  26.0 - 34.0 pg   MCHC 35.8  30.0 - 36.0 g/dL   RDW 12.7  11.5 - 15.5 %   Platelets 203  150 - 400 K/uL   Neutrophils Relative % 54  43 - 77 %   Neutro Abs 3.6  1.7 - 7.7 K/uL   Lymphocytes Relative 33  12 - 46 %   Lymphs Abs 2.1  0.7 - 4.0 K/uL   Monocytes Relative 10  3 - 12 %   Monocytes Absolute 0.6  0.1 - 1.0 K/uL   Eosinophils Relative 2  0 - 5 %   Eosinophils Absolute 0.1  0.0 - 0.7 K/uL   Basophils Relative 1  0 - 1 %   Basophils Absolute 0.0  0.0 - 0.1 K/uL  TROPONIN I      Result Value Ref Range   Troponin I <0.30  <0.30 ng/mL  PRO B NATRIURETIC PEPTIDE      Result Value Ref Range   Pro B Natriuretic peptide (BNP) 221.3 (*) 0 - 125 pg/mL   Dg Chest 2 View  10/15/2013   CLINICAL DATA:  Two day history of intermittent chest pain and shortness of breath.  EXAM: CHEST  2 VIEW  COMPARISON:  DG CHEST 2 VIEW dated 09/12/2013; DG CHEST 2 VIEW dated 07/16/2013; DG CHEST 2 VIEW dated 07/02/2013; DG CHEST 2 VIEW dated 06/26/2013  FINDINGS: Suboptimal inspiration due to body habitus accounts for crowded bronchovascular markings diffusely and bilateral lower lobe atelectasis, and also accentuates the cardiac silhouette. Taking this into account, cardiac silhouette mildly enlarged but stable. Hilar and mediastinal contours otherwise unremarkable. Lungs otherwise clear. No localized airspace consolidation. No pleural effusions. No pneumothorax. Normal pulmonary vascularity. Degenerative changes involving the thoracic spine.  IMPRESSION: Suboptimal inspiration accounts for mild bilateral lower lobe atelectasis. No acute cardiopulmonary disease otherwise. Stable mild cardiomegaly without pulmonary edema.   Electronically Signed   By: Evangeline Dakin M.D.   On: 10/15/2013 20:57    MDM   Final diagnoses:  None   I'm concerned that this young exertion the patient is experiencing is his angina equivalent in that symptoms resemble "heart attack in the past. He  reports that he had no chest pain with his prior MI. I don't feel that he needs acute imaging of his abdomen. He is nontender. He had an unremarkable CT scan of his abdomen October 2014 Case discussed with Dr. Curly Rim plan 23 hour observation telemetry Dx#1 dyspnea #2 hyperglycemia #3 renal insufficiency      Orlie Dakin, MD 10/15/13 2201

## 2013-10-15 NOTE — ED Notes (Signed)
PT C/O SOB X 2 DAYS AND INTERMITTENT CHEST PAIN 30 MIN PTA. pT DOES ALSO C/O PRESSURE ON THE LEFT SIDE OF HIS ABDOMEN.

## 2013-10-15 NOTE — H&P (Signed)
[facility] History and Physical  Eugene [name] MEQ:[id number] DOB: Nov 06, 1955 DOA: 10/15/2013   PCP: [name], MD  Specialists: Followed by cardiology in [place]  Chief Complaint: Shortness of breath for 4-5 days  HPI: Eugene [name] is a 58 y.o. male with a past medical history of coronary artery disease with stent placement into the distal RCA in January of 2014. He had a pericardial window in October of 2014. Both of these were done at [facility]. He also has a history of, diabetes, hypertension, sleep apnea, diabetic gastropathy, and neuropathy. He also has a history of diastolic congestive heart failure, which is chronic. He was in his usual state of health till about 4 or 5 days ago when he started noticing shortness of breath which was initially with exertion, and, then even at rest. He's had a chronic cough. Denies any recent worsening of the cough. Had chest pain a while ago, but none currently. Has had nausea and and occasionally does tend to have some vomiting after he eats. No abdominal pain, but has noticed fullness in the abdomen. He was constipated for a few days and then had hard stool yesterday and then had loose stools today. Denies any fever or chills. No sick contacts. It appears that he had similar presentation on January 17. He had a followup with his cardiologist on January 23rd. He does admit to lower extremity edema, which is not much worse than usual. He has gained a few pounds over the last few weeks. He does take diuretics at home. Overall, patient is not a very good historian.  Home Medications: Prior to Admission medications   Medication Sig Start Date End Date Taking? Authorizing Provider  ALPRAZolam Duanne Moron) 0.5 MG tablet Take one half to one tablet by mouth at bedtime as needed for sleep. 07/28/13  Yes [name], MD  aspirin EC 81 MG tablet Take 81 mg by mouth at bedtime.    Yes Historical Provider, MD  bisacodyl (DULCOLAX) 5 MG  EC tablet Take 2 tablets (10 mg total) by mouth daily as needed for constipation. 06/26/13  Yes Roger A Arguello, PA-C  clopidogrel (PLAVIX) 75 MG tablet Take 1 tablet (75 mg total) by mouth daily. 06/29/13  Yes Lendon Colonel, NP  doxazosin (CARDURA) 4 MG tablet Take 2 mg by mouth at bedtime. 08/18/12  Yes Yehuda Savannah, MD  fenofibrate 160 MG tablet Take 160 mg by mouth at bedtime.   Yes Historical Provider, MD  Flaxseed, Linseed, (FLAXSEED OIL) 1000 MG CAPS Take 1,000 mg by mouth daily.   Yes Historical Provider, MD  Insulin Glargine (LANTUS SOLOSTAR) 100 UNIT/ML Solostar Pen Inject 80 Units into the skin at bedtime.   Yes Historical Provider, MD  metoprolol tartrate (LOPRESSOR) 25 MG tablet Take 1 tablet (25 mg total) by mouth 2 (two) times daily. 06/26/13  Yes Roger A Arguello, PA-C  torsemide (DEMADEX) 20 MG tablet Take 20 mg by mouth 2 (two) times daily.  07/07/13  Yes Lendon Colonel, NP  valsartan (DIOVAN) 80 MG tablet Take 40-80 mg by mouth 2 (two) times daily. Patient takes 1 tablet in the morning and 1/2 tablet at night   Yes Historical Provider, MD  nitroGLYCERIN (NITROSTAT) 0.4 MG SL tablet Place 1 tablet (0.4 mg total) under the tongue every 5 (five) minutes x 3 doses as needed for chest pain. 09/16/12   Rogelia Mire, NP    Allergies:  Allergies  Allergen Reactions  . Hydrocodone Nausea  And Vomiting  . Lisinopril Cough  . Neurontin [Gabapentin]     Side effects, suicidal thoughts  . Statins Other (See Comments)    Muscle aches  . Metformin And Related     Intestinal side effects    Past Medical History: Past Medical History  Diagnosis Date  . Arteriosclerotic cardiovascular disease (ASCVD)     a. 05/2011 Cath/PCI: LM nl, LAD 49m D1 small, D2 small 845mLCX large 40p, RCA 50-60p, 99 hazy @ origin of PDA with 70-80 in PDA (2.5x26 Resolute Integrity & 3.0x15 Resolute Integrity DES).;  b. 08/2012 Inflat  STEMI/Cath/PCI: LM minor irregs, LAD 50p, D1 50, LCX nl,  OM1 25, RCA 30-40p, 100d (treated with 2.75x3855mromus Premier DES);  c. 08/2012 Echo: EF 55-60%, basal inferopost HK.  . HMarland Kitchenperlipidemia   . Diabetes mellitus     Peripheral neuropathy  . Bell palsy   . Hypertension   . COPD (chronic obstructive pulmonary disease)   . Sleep apnea   . Gallstones   . Cholelithiasis 07/2012    Asymptomatic; identified incidentally  . PONV (postoperative nausea and vomiting)   . Anxiety   . C. difficile colitis     a. 08/2012  . Myocardial infarct 09/08/12  . Nephrolithiasis   . Contrast dye induced nephropathy     a. 08/2012 post cath/pci  . CHF (congestive heart failure)   . Asthma   . Heart disease   . Old myocardial infarction     Past Surgical History  Procedure Laterality Date  . Circumcision    . Stents    . [place]  [facility]      [facility] [facility]. Advised to come back in 10 years.  . Esophagogastroduodenoscopy  06/12/2012    Procedure: ESOPHAGOGASTRODUODENOSCOPY (EGD);  Surgeon: RobDaneil DolinD;  [facility];  Service: Endoscopy;  Laterality: N/A;  9:45  . Cardiac catheterization    . Subxyphoid pericardial window N/A 06/23/2013    Procedure: SUBXYPHOID PERICARDIAL WINDOW;  Surgeon: EdwGrace IsaacD;  [facility];  Laterality: N/A;  . Intraoperative transesophageal echocardiogram N/A 06/23/2013    Procedure: INTRAOPERATIVE TRANSESOPHAGEAL ECHOCARDIOGRAM;  Surgeon: EdwGrace IsaacD;  [facility];  Laterality: N/A;    Social History: [place], alcohol or illicit drug use. He uses a cane to ambulate due to neuropathy.  Family History:  Family History  Problem Relation Age of Onset  . Diabetes Mother   . Heart attack Mother   . Stroke Mother   . Diabetes Sister   . Sleep apnea Sister   . Hypertension Brother   .  Diabetes Brother   . Colon cancer Neg Hx   . Liver disease Neg Hx   . Diabetes Brother   . Hypertension Brother      Review of Systems - History obtained from the patient General ROS: positive for  - fatigue Psychological ROS: negative Ophthalmic ROS: negative ENT ROS: negative Allergy and Immunology ROS: negative Hematological and Lymphatic ROS: negative Endocrine ROS: negative Respiratory ROS: as in hpi Cardiovascular ROS: as in hpi Gastrointestinal ROS: as in hpi Genito-Urinary ROS: no dysuria, trouble voiding, or hematuria Musculoskeletal ROS: negative Neurological ROS: no TIA or stroke symptoms Dermatological ROS: negative  Physical Examination  Filed Vitals:   10/15/13 1946  BP: 167/84  Pulse: 88  Temp: 97.9 F (36.6 C)  Resp: 20  Height: _0  (1.854 m)  Weight: 121.564 kg (268 lb)  SpO2: 97%    General appearance: alert, cooperative, appears stated age and no distress Head: Normocephalic, without obvious abnormality, atraumatic Eyes: conjunctivae/corneas clear. PERRL, EOM's intact. Throat: lips, mucosa, and tongue normal; teeth and gums normal Neck: no adenopathy, no carotid bruit, no JVD, supple, symmetrical, trachea midline and thyroid not enlarged, symmetric, no tenderness/mass/nodules Resp: clear to auscultation bilaterally Cardio: regular rate and rhythm, S1, S2 normal, no murmur, click, rub or gallop GI: soft, non-tender; bowel sounds normal; no masses,  no organomegaly Extremities: edema 1+ pitting bilateral LE Pulses: 2+ and symmetric Skin: Skin color, texture, turgor normal. No rashes or lesions Lymph nodes: Cervical, supraclavicular, and axillary nodes normal. Neurologic: No focal deficits.  Laboratory Data: Results for orders placed during the hospital encounter of 10/15/13 (from the past 48 hour(s))  BASIC METABOLIC PANEL     Status: Abnormal   Collection Time    10/15/13  8:15 PM      Result Value Ref Range   Sodium 136 (*) 137 - 147 mEq/L    Potassium 4.4  3.7 - 5.3 mEq/L   Chloride 98  96 - 112 mEq/L   CO2 26  19 - 32 mEq/L   Glucose, Bld 200 (*) 70 - 99 mg/dL   BUN 45 (*) 6 - 23 mg/dL   Creatinine, Ser 1.68 (*) 0.50 - 1.35 mg/dL   Calcium 9.7  8.4 - 10.5 mg/dL   GFR calc non Af Amer 44 (*) >90 mL/min   GFR calc Af Amer 51 (*) >90 mL/min   Comment: (NOTE)     The eGFR has been calculated using the CKD EPI equation.     This calculation has not been validated in all clinical situations.     eGFR's persistently <90 mL/min signify possible Chronic Kidney     Disease.  CBC WITH DIFFERENTIAL     Status: Abnormal   Collection Time    10/15/13  8:15 PM      Result Value Ref Range   WBC 6.5  4.0 - 10.5 K/uL   RBC 3.76 (*) 4.22 - 5.81 MIL/uL   Hemoglobin 11.8 (*) 13.0 - 17.0 g/dL   HCT 33.0 (*) 39.0 - 52.0 %   MCV 87.8  78.0 - 100.0 fL   MCH 31.4  26.0 - 34.0 pg   MCHC 35.8  30.0 - 36.0 g/dL   RDW 12.7  11.5 - 15.5 %   Platelets 203  150 - 400 K/uL   Neutrophils Relative % 54  43 - 77 %   Neutro Abs 3.6  1.7 - 7.7 K/uL   Lymphocytes Relative 33  12 - 46 %   Lymphs Abs 2.1  0.7 - 4.0 K/uL   Monocytes Relative 10  3 - 12 %   Monocytes Absolute 0.6  0.1 - 1.0 K/uL   Eosinophils Relative 2  0 - 5 %   Eosinophils Absolute 0.1  0.0 - 0.7 K/uL   Basophils Relative 1  0 - 1 %   Basophils Absolute 0.0  0.0 - 0.1 K/uL  TROPONIN I     Status: None   Collection Time    10/15/13  8:15 PM      Result Value Ref Range   Troponin I <0.30  <0.30 ng/mL   Comment:            Due to  the release kinetics of cTnI,     a negative result within the first hours     of the onset of symptoms does not rule out     myocardial infarction with certainty.     If myocardial infarction is still suspected,     repeat the test at appropriate intervals.  PRO B NATRIURETIC PEPTIDE     Status: Abnormal   Collection Time    10/15/13  8:15 PM      Result Value Ref Range   Pro B Natriuretic peptide (BNP) 221.3 (*) 0 - 125 pg/mL    Radiology  Reports: Dg Chest 2 View  10/15/2013   CLINICAL DATA:  Two day history of intermittent chest pain and shortness of breath.  EXAM: CHEST  2 VIEW  COMPARISON:  DG CHEST 2 VIEW dated 09/12/2013; DG CHEST 2 VIEW dated 07/16/2013; DG CHEST 2 VIEW dated 07/02/2013; DG CHEST 2 VIEW dated 06/26/2013  FINDINGS: Suboptimal inspiration due to body habitus accounts for crowded bronchovascular markings diffusely and bilateral lower lobe atelectasis, and also accentuates the cardiac silhouette. Taking this into account, cardiac silhouette mildly enlarged but stable. Hilar and mediastinal contours otherwise unremarkable. Lungs otherwise clear. No localized airspace consolidation. No pleural effusions. No pneumothorax. Normal pulmonary vascularity. Degenerative changes involving the thoracic spine.  IMPRESSION: Suboptimal inspiration accounts for mild bilateral lower lobe atelectasis. No acute cardiopulmonary disease otherwise. Stable mild cardiomegaly without pulmonary edema.   Electronically Signed   By: Evangeline Dakin M.D.   On: 10/15/2013 20:57    Electrocardiogram: Sinus rhythm at 88 beats per minute. Normal axis. Intervals are normal. Q waves noted in lead 3 and aVF. No concerning ST or T-wave changes are noted. Incomplete right bundle branch block. Similar to old EKG.  Problem List  Principal Problem:   Dyspnea Active Problems:   DM type 2 (diabetes mellitus, type 2)   Hypertension   Arteriosclerotic cardiovascular disease (ASCVD)   Obstructive sleep apnea   Peripheral neuropathy   Gastroesophageal reflux disease   Gastroparesis due to DM   Chronic diastolic CHF (congestive heart failure)   Assessment: This is a 58 year old, Caucasian male, who presents with dyspnea for the last 5 days, along with abdominal fullness. He is very concerned that he has some cancer in his abdomen. No chest pain currently, but was having some pain earlier today. I think most of his symptoms are chronic. Definitely the  dyspnea could be an angina equivalent. Considering his history of coronary artery disease he merits observation in the hospital.  Plan: #1 dyspnea in the setting of known CAD: BNP is not that significantly elevated although he does have pitting lower extremity edema. Characteristics are not concerning for venous thromboembolism. He will be given a dose of intravenous Lasix tonight. He'll be monitored closely in the hospital. Troponin will be obtained serially. Cardiology will be consulted to see him in the morning. He was also recently operated for large pericardial effusion, but x-ray does not suggest same at this time.  #2 abdominal fullness: Abdomen is completely benign on examination. He reports a colonoscopy about 3 years ago, which was unremarkable. CT scan of the abdomen done in October of last year does not show anything concerning either. We will get LFTs and lipase level. I have reassured the patient. He can follow up for this issue with his gastroenterologist. It appears that constipation may be playing some role in his symptomatology. Also, diabetic gastroparesis may be playing a role as well.  #3  history of coronary artery disease: Stent was placed in RCA last year. Continue with aspirin, Plavix. EKG does not show any new ischemic changes. Further management per cardiology in the morning.  #4 history of obstructive sleep apnea: Continue with CPAP  #5 chronic diastolic congestive heart failure with pedal edema: Give a dose of intravenous Lasix tonight and continue with his usual medications from the morning.  #6 history of chronic kidney disease: Creatinine could be slightly higher than his baseline, but not that significant. We will repeat the values in the morning. He is making urine.   #7 history of diabetes mellitus, type II: Continue with his Lantus at a slightly lower dose. Sliding scale coverage will be provided.    DVT Prophylaxis: Heparin Code Status: Full code Family  Communication: Discussed with the patient  Disposition Plan: Observe to telemetry   Further management decisions will depend on results of further testing and patient's response to treatment.  [facility]  [facility] Pager [phone]  If 7PM-7AM, please contact night-coverage [email or web address] Password [email or web address]  10/15/2013, 10:20 PM

## 2013-10-15 NOTE — ED Notes (Signed)
EKG done and patient placed on monitor at this time.

## 2013-10-15 NOTE — Progress Notes (Signed)
Hospital CPAP unit setup and ready for patient use. RN doing assessment right now and patient not ready to go to sleep. Patient displayed understanding of how to get mask on and turn machine on and stated that he would put on when he was ready. Patient told to call RN or RRT if he needs help with machine later.

## 2013-10-16 ENCOUNTER — Ambulatory Visit: Payer: Medicare PPO | Admitting: Family Medicine

## 2013-10-16 DIAGNOSIS — I5032 Chronic diastolic (congestive) heart failure: Secondary | ICD-10-CM | POA: Diagnosis not present

## 2013-10-16 DIAGNOSIS — R0989 Other specified symptoms and signs involving the circulatory and respiratory systems: Secondary | ICD-10-CM | POA: Diagnosis not present

## 2013-10-16 DIAGNOSIS — G4733 Obstructive sleep apnea (adult) (pediatric): Secondary | ICD-10-CM | POA: Diagnosis not present

## 2013-10-16 DIAGNOSIS — I2510 Atherosclerotic heart disease of native coronary artery without angina pectoris: Secondary | ICD-10-CM

## 2013-10-16 DIAGNOSIS — R0609 Other forms of dyspnea: Secondary | ICD-10-CM

## 2013-10-16 DIAGNOSIS — R141 Gas pain: Secondary | ICD-10-CM | POA: Diagnosis not present

## 2013-10-16 DIAGNOSIS — E119 Type 2 diabetes mellitus without complications: Secondary | ICD-10-CM | POA: Diagnosis not present

## 2013-10-16 DIAGNOSIS — I509 Heart failure, unspecified: Secondary | ICD-10-CM

## 2013-10-16 DIAGNOSIS — R0602 Shortness of breath: Secondary | ICD-10-CM

## 2013-10-16 DIAGNOSIS — I319 Disease of pericardium, unspecified: Secondary | ICD-10-CM

## 2013-10-16 DIAGNOSIS — I7090 Unspecified atherosclerosis: Secondary | ICD-10-CM | POA: Diagnosis not present

## 2013-10-16 LAB — COMPREHENSIVE METABOLIC PANEL
ALK PHOS: 50 U/L (ref 39–117)
ALT: 22 U/L (ref 0–53)
AST: 23 U/L (ref 0–37)
Albumin: 3.3 g/dL — ABNORMAL LOW (ref 3.5–5.2)
BILIRUBIN TOTAL: 0.3 mg/dL (ref 0.3–1.2)
BUN: 47 mg/dL — AB (ref 6–23)
CO2: 28 mEq/L (ref 19–32)
Calcium: 9.4 mg/dL (ref 8.4–10.5)
Chloride: 100 mEq/L (ref 96–112)
Creatinine, Ser: 1.7 mg/dL — ABNORMAL HIGH (ref 0.50–1.35)
GFR, EST AFRICAN AMERICAN: 50 mL/min — AB (ref >90)
GFR, EST NON AFRICAN AMERICAN: 43 mL/min — AB (ref >90)
GLUCOSE: 155 mg/dL — AB (ref 70–99)
POTASSIUM: 4.3 meq/L (ref 3.7–5.3)
Sodium: 139 mEq/L (ref 137–147)
TOTAL PROTEIN: 6.8 g/dL (ref 6.0–8.3)

## 2013-10-16 LAB — CBC
HEMATOCRIT: 30.1 % — AB (ref 39.0–52.0)
Hemoglobin: 10.7 g/dL — ABNORMAL LOW (ref 13.0–17.0)
MCH: 31.7 pg (ref 26.0–34.0)
MCHC: 35.5 g/dL (ref 30.0–36.0)
MCV: 89.1 fL (ref 78.0–100.0)
PLATELETS: 154 10*3/uL (ref 150–400)
RBC: 3.38 MIL/uL — ABNORMAL LOW (ref 4.22–5.81)
RDW: 12.8 % (ref 11.5–15.5)
WBC: 5.4 10*3/uL (ref 4.0–10.5)

## 2013-10-16 LAB — GLUCOSE, CAPILLARY
GLUCOSE-CAPILLARY: 219 mg/dL — AB (ref 70–99)
Glucose-Capillary: 131 mg/dL — ABNORMAL HIGH (ref 70–99)
Glucose-Capillary: 140 mg/dL — ABNORMAL HIGH (ref 70–99)
Glucose-Capillary: 230 mg/dL — ABNORMAL HIGH (ref 70–99)

## 2013-10-16 LAB — TROPONIN I: Troponin I: <0.3 ng/mL (ref <0.30)

## 2013-10-16 MED ORDER — POLYETHYLENE GLYCOL 3350 17 G PO PACK
17.0000 g | PACK | Freq: Every day | ORAL | Status: DC
  Administered 2013-10-17: 17 g via ORAL
  Filled 2013-10-16: qty 1

## 2013-10-16 NOTE — Progress Notes (Signed)
UR completed 

## 2013-10-16 NOTE — Progress Notes (Signed)
Spoke with patient about diabetes and home regimen for diabetes control.  Patient reports that he takes Lantus 80 units QHS as an outpatient for diabetes control.  Patient sees Dr. [name] (his PCP) for diabetes management.  Patient states that he does have Novolog insulin at home but he does not use it.  He states that he has a sliding scale to use with the Novolog but he feels that he does not need the Novolog.  According to the patient he checks his fasting blood sugar which is usually 180 or less and he does not check his blood sugar again throughout the day unless he feels it is low or if he "just don't feel right".  Patient states that his A1C runs between 7.2 and 7.8% which indicates good control.    Noted patient did not receive any basal insulin last night and his fasting CBG was 140 mg/dl this morning at 7:41. Patient states that his blood sugar was 131 mg/dl last night at bedtime so he told the nurse he did not feel that he needed to take the Lantus because he did not want his blood sugar to bottom out.  Patient reports that his blood sugar was lower last night and this morning because the last meal he ate was around 4:00pm yesterday.  Inquired about whether he takes Lantus every night at home or if he decreases or skips doses based on his blood sugar.  Patient reports that he always takes Lantus 80 units every night at bedtime.  Patient states that he has lows "sometimes" if he does not eat "pretty regularly".  Patient reports that he had a heart attack 2 years ago and since then he has gotten serious about diabetes control.  He admits that he use to skip doses of insulin trying to get by with the insulin he has but he no longer skips any of his medications.  He reports that he is on a limited income because he is on disability and his co-pays for his Lantus is $50 per month.  Informed patient about patient assistance programs with various pharmaceutical companies and encouraged him to check into  applying for patient assistance program for Lantus.   Currently patient is ordered Lantus 60 units QHS, Novolog 0-9 units AC, and Novolog 0-5 units HS for inpatient glycemic control.  Patient states that he feels Lantus 60 units is a safe dose for him while in the hospital and states that "Lantus 60 units is usually the amount of Lantus they give me when I am in the hospital".  Patient is able to feel when his blood sugar is dropping and he states that he will inform Nursing if he feels like his blood sugar is dropping at any time.  Patient verbalized understanding of information discussed and reports that he does not have any further questions at this time.  Thanks, Barnie Alderman, RN, MSN, CCRN Diabetes Coordinator Inpatient Diabetes Program [phone] (Team Pager) [phone] ([facility] office) [phone] [facility] office)

## 2013-10-16 NOTE — Consult Note (Signed)
Consulting cardiologist: Eugene Dolly MD  Clinical Summary Eugene [name] is a 58 y.o.male history of CAD with prior RCA stent in 2014, pericardial effusion with window, DM, HTN, OSA, and diastolic heart failure admitted with SOB, DOE, and weight gain. Reports over the last 4-5 days worsening SOB at rest and with exertion. Denies any chest pain. Chronic LE edema that has not gotten any worst. Chronic non-productive cough that is stable, denies any fevers or chills.   From cardiac standpoint history of STEMI in Jan 2014. Cath showed LM patent, LAD 50% proximal, 50% D1, LCX patent, RCA 30-40% in previously placed stent and 100% distal occlusion. RCA received a DES. LV gram showed LVEF 45%, inferior hypokinesis. Also has history of pericardial effusion 05/2013, negative for TB and malignancy. Most recent echo showed trivial pericardial effusion 07/2013.  CXR without significant pathology, trop neg x 3, pro-BNP 221, BUN 45, Cr 1.68 (baseline 1.3-1.4). EKG showed NSR, RBBB, LAE, old inferior Q-waves. Received lasix overnight with uptrend of Cr and BUN.   Allergies  Allergen Reactions  . Hydrocodone Nausea And Vomiting  . Lisinopril Cough  . Neurontin [Gabapentin]     Side effects, suicidal thoughts  . Statins Other (See Comments)    Muscle aches  . Metformin And Related     Intestinal side effects    Medications Scheduled Medications: . aspirin EC  81 mg Oral QHS  . clopidogrel  75 mg Oral QAC breakfast  . docusate sodium  100 mg Oral BID  . doxazosin  2 mg Oral QHS  . heparin  5,000 Units Subcutaneous 3 times per day  . insulin aspart  0-5 Units Subcutaneous QHS  . insulin aspart  0-9 Units Subcutaneous TID WC  . insulin glargine  60 Units Subcutaneous QHS  . irbesartan  75 mg Oral Daily  . metoprolol tartrate  25 mg Oral BID  . sodium chloride  3 mL Intravenous Q12H  . sodium chloride  3 mL Intravenous Q12H  . torsemide  20 mg Oral BID     Infusions:     PRN  Medications:  sodium chloride, acetaminophen, acetaminophen, ALPRAZolam, ondansetron (ZOFRAN) IV, ondansetron, sodium chloride   Past Medical History  Diagnosis Date  . Arteriosclerotic cardiovascular disease (ASCVD)     a. 05/2011 Cath/PCI: LM nl, LAD 53m, D1 small, D2 small 71m, LCX large 40p, RCA 50-60p, 99 hazy @ origin of PDA with 70-80 in PDA (2.5x26 Resolute Integrity & 3.0x15 Resolute Integrity DES).;  b. 08/2012 Inflat  STEMI/Cath/PCI: LM minor irregs, LAD 50p, D1 50, LCX nl, OM1 25, RCA 30-40p, 100d (treated with 2.75x55mm Promus Premier DES);  c. 08/2012 Echo: EF 55-60%, basal inferopost HK.  [name] Hyperlipidemia   . Diabetes mellitus     Peripheral neuropathy  . Bell palsy   . Hypertension   . COPD (chronic obstructive pulmonary disease)   . Sleep apnea   . Gallstones   . Cholelithiasis 07/2012    Asymptomatic; identified incidentally  . PONV (postoperative nausea and vomiting)   . Anxiety   . C. difficile colitis     a. 08/2012  . Myocardial infarct 09/08/12  . Nephrolithiasis   . Contrast dye induced nephropathy     a. 08/2012 post cath/pci  . CHF (congestive heart failure)   . Asthma   . Heart disease   . Old myocardial infarction     Past Surgical History  Procedure Laterality Date  . Circumcision    . Stents    .  Esophagogastroduodenoscopy      in [place] over 20 yrs ago  . Colonoscopy      In [facility], approximately 2011 per patient, was normal. Advised to come back in 10 years.  . Esophagogastroduodenoscopy  06/12/2012    Procedure: ESOPHAGOGASTRODUODENOSCOPY (EGD);  Surgeon: Eugene Dolin, MD;  [facility];  Service: Endoscopy;  Laterality: N/A;  9:45  . Cardiac catheterization    . Subxyphoid pericardial window N/A 06/23/2013    Procedure: SUBXYPHOID PERICARDIAL WINDOW;  Surgeon: Eugene Isaac, MD;  Location: [place];  Service: Thoracic;  Laterality: N/A;  . Intraoperative transesophageal echocardiogram N/A 06/23/2013     Procedure: INTRAOPERATIVE TRANSESOPHAGEAL ECHOCARDIOGRAM;  Surgeon: Eugene Isaac, MD;  Location: [place];  Service: Open Heart Surgery;  Laterality: N/A;    Family History  Problem Relation Age of Onset  . Diabetes Mother   . Heart attack Mother   . Stroke Mother   . Diabetes Sister   . Sleep apnea Sister   . Hypertension Brother   . Diabetes Brother   . Colon cancer Neg Hx   . Liver disease Neg Hx   . Diabetes Brother   . Hypertension Brother     Social History Eugene [name] reports that he has never smoked. He does not have any smokeless tobacco history on file. Eugene [name] reports that he does not drink alcohol.  Review of Systems CONSTITUTIONAL: No weight loss, fever, chills, weakness or fatigue.  HEENT: Eyes: No visual loss, blurred vision, double vision or yellow sclerae. No hearing loss, sneezing, congestion, runny nose or sore throat.  SKIN: No rash or itching.  CARDIOVASCULAR: per HPI RESPIRATORY: per HPI GASTROINTESTINAL: abdominal fullness GENITOURINARY: no polyuria, no dysuria NEUROLOGICAL: No headache, dizziness, syncope, paralysis, ataxia, numbness or tingling in the extremities. No change in bowel or bladder control.  MUSCULOSKELETAL: No muscle, back pain, joint pain or stiffness.  HEMATOLOGIC: No anemia, bleeding or bruising.  LYMPHATICS: No enlarged nodes. No history of splenectomy.  PSYCHIATRIC: No history of depression or anxiety.      Physical Examination Blood pressure 131/75, pulse 74, temperature 98.4 F (36.9 C), temperature source Oral, resp. rate 20, height 6\' 1"  (1.854 m), weight 256 lb 2.8 oz (116.2 kg), SpO2 99.00%.  Intake/Output Summary (Last 24 hours) at 10/16/13 1214 Last data filed at 10/16/13 0500  Gross per 24 hour  Intake      0 ml  Output    300 ml  Net   -300 ml    Cardiovascular: RRR, no m/r/g, no JVD, no carotid bruits  Respiratory: CTAB  GI: abdomen soft, NT, ND  MSK: 1+ bilateral edema in lower  extremities  Neuro: no focal deficits   Lab Results  Basic Metabolic Panel:  Recent Labs Lab 10/15/13 2015 10/16/13 0509  NA 136* 139  K 4.4 4.3  CL 98 100  CO2 26 28  GLUCOSE 200* 155*  BUN 45* 47*  CREATININE 1.68* 1.70*  CALCIUM 9.7 9.4    Liver Function Tests:  Recent Labs Lab 10/15/13 2015 10/16/13 0509  AST 24 23  ALT 24 22  ALKPHOS 65 50  BILITOT 0.3 0.3  PROT 7.2 6.8  ALBUMIN 3.5 3.3*    CBC:  Recent Labs Lab 10/15/13 2015 10/16/13 0509  WBC 6.5 5.4  NEUTROABS 3.6  --   HGB 11.8* 10.7*  HCT 33.0* 30.1*  MCV 87.8 89.1  PLT 203 154    Cardiac Enzymes:  Recent Labs Lab 10/15/13 2015  10/15/13 2316 10/16/13 0509  TROPONINI <0.30 <0.30 <0.30    BNP: No components found with this basename: POCBNP,    ECG NSR, RBBB, LAE, old inferior Q-waves  Imaging 07/2013 Echo LVEF 60-65%, moderate LVH, no WMAs, small pericardial effusion. Diastolic function is not described  Cath Jan 2014 PROCEDURAL FINDINGS  Hemodynamics:  AO 138/80  LV 136/26  Coronary angiography:  Coronary dominance: right  Left mainstem: Patent with minor irregularity. No significant stenosis  Left anterior descending (LAD): Diffuse disease in the proximal and mid vessel but no severe stenoses present. 50% proximal LAD stenosis, 50% diagonal stenosis (D1), diffuse distal vessel disease  Left circumflex (LCx): Widely patent, very large vessel. OM1 is large with mild nonobstructive disease 25% stenosis.  Right coronary artery (RCA): Proximal vessel 30-40% stenosis within previously implanted stent. Distal vessel 100% occluded prior to stented segment in PDA  Left ventriculography: Left ventricular systolic function is Moderately depressed with severe hypokinesis of the inferior wall. The LVEF is estimated at 45%.   PCI Data:  Vessel - RCA/Segment - distal  Percent Stenosis (pre) 100  TIMI-flow 0  Stent 2.75 x 38 mm Promus DES  Percent Stenosis (post) 0  TIMI-flow  (post) 3  Final Conclusions:  1. Acute inferior MI secondary to total occlusion of the distal RCA, treated successfully with primary PCI  2. Diffuse distal vessel disease of the LAD and moderate proximal LAD stenosis  3. Minor nonobstructive LCx stenosis  4. Moderate segmental LV dysfunction with an LVEF of 45% and inferior wall hypokinesis   CXR 10/15/13 FINDINGS:  Suboptimal inspiration due to body habitus accounts for crowded  bronchovascular markings diffusely and bilateral lower lobe  atelectasis, and also accentuates the cardiac silhouette. Taking  this into account, cardiac silhouette mildly enlarged but stable.  Hilar and mediastinal contours otherwise unremarkable. Lungs  otherwise clear. No localized airspace consolidation. No pleural  effusions. No pneumothorax. Normal pulmonary vascularity.  Degenerative changes involving the thoracic spine.  IMPRESSION:  Suboptimal inspiration accounts for mild bilateral lower lobe  atelectasis. No acute cardiopulmonary disease otherwise. Stable mild  cardiomegaly without pulmonary edema  Impression/Recommendations 1. Shortness of breath - unclear etiology, no clear cardiac source - history not consistent with ischemia as he has had no chest pain, and symptoms exist both at rest and with exertion. EKG without ischemic changes and negative troponins - does not appear significantly volume overloaded by exam or CXR, BNP not significantly elevated. BUN/Cr above baseline with uptrend overnight after lasix supporting he is not volume overloaded.  - he does have history of pericardial effusion with prior window in 05/2013. No clinical signs of tamponade, will obtain bedside limited echo to evaluate for possible effusion - with chronic cough and SOB, consider trial of bronchodilatiors.   2. CAD - no chest pain, symptoms not consistent with cardiac ischemia - continue current meds, defer discontinuation timing of his plavix to his primary  cardiologist  3. Chronic diastolic heart failure - as described above, no significant evidence of volume overload. He has chronic LE edema that is unchanged and could be related to venous insuffiiciency, the rest of his exam and laboratory findings and CXR do not support volume overload - with uptrending Cr/BUN recommend holding PM dose of toresmide. Will write to hold.    Eugene [name], M.D., F.A.C.C.

## 2013-10-16 NOTE — Progress Notes (Signed)
*  PRELIMINARY RESULTS* Echocardiogram 2D Echocardiogram has been performed.  [place], [place] 10/16/2013, 4:30 PM

## 2013-10-16 NOTE — Care Management Note (Signed)
    Page 1 of 1   10/16/2013     1:46:53 PM   CARE MANAGEMENT NOTE 10/16/2013  Patient:  Eugene [name], Eugene [name]   Account Number:  [id number]  Date Initiated:  10/16/2013  Documentation initiated by:  Theophilus Kinds  Subjective/Objective Assessment:   Pt admitted from home with dyspnea and CP. Pt lives with his wife and will return home at discharge. Pt has cpap for home use no home O2.     Action/Plan:   WIll make referral to [facility] for COPD Gold program. No other CM needs noted.   Anticipated DC Date:  10/17/2013   Anticipated DC Plan:  [place]  CM consult      Choice offered to / List presented to:             Status of service:  Completed, signed off Medicare Important Message given?  YES (If response is "NO", the following Medicare IM given date fields will be blank) Date Medicare IM given:  10/16/2013 Date Additional Medicare IM given:    Discharge Disposition:  HOME/SELF CARE  Per UR Regulation:    If discussed at Long Length of Stay Meetings, dates discussed:    Comments:  10/16/13 [place], RN BSN CM

## 2013-10-16 NOTE — Progress Notes (Signed)
Gave pt COPD gold card# [id number]. Went over packet with pt he has no new questions at this time. Will continue to monitor.

## 2013-10-16 NOTE — Progress Notes (Signed)
PROGRESS NOTE    Eugene [name] [id number] DOB: Oct 12, 1955 DOA: 10/15/2013 PCP: [name], MD  HPI/Brief narrative 58 year old male with history of CAD, status post RCA stent 2014, pericardial effusion status post window, DM 2, HTN, OSA on nightly CPAP, chronic diastolic CHF presents with 4-5 days history of progressively worsening dyspnea at rest and with activity, 7 pound weight gain over the last 2 months and chronic lower extremity edema. Patient has mild intermittent dry cough. He was admitted for evaluation of dyspnea. He also complains of abdominal distention but has normal BM and no abdominal pain.  Assessment/Plan:  1. Dyspnea: Unclear etiology. DD-? Acute bronchitis complicating underlying OSA, possible mild acute on chronic diastolic CHF. Cardiology input appreciated-not convinced that dyspnea is related to ischemia, EKG without acute findings, negative troponins and not convinced about CHF. Getting limited 2-D echo to evaluate pericardial effusion. Will consider trial of bronchodilators. 2. Abdominal fullness: No acute findings on exam apart from obese abdomen/distention. CT abdomen of October 2014 unremarkable. LFTs not significant. Patient had a BM today. Outpatient followup with GI.? Constipation. Trial of bowel regimen. 3. History of CAD: Ruled out for MI. No chest pain. Management per cardiology. 4. OSA: Continue nightly CPAP. 5. Chronic diastolic CHF: Received a dose of Lasix overnight with uptake in creatinine. Monitor BMP. Management per cardiology. Lasix held 6. Stage III chronic kidney disease: Baseline creatinine probably in the 1.3-1.5 range. Slightly elevated on arrival and worsened by Lasix. Follow BMP in a.m. 7. Hypertension: Controlled 8. Anemia of chronic kidney disease: Seems stable. Follow CBC   Code Status: Full Family Communication: None at bedside Disposition Plan: Home when medically  stable   Consultants:  Cardiology  Procedures:  None  Antibiotics:  None   Subjective: Mild intermittent nonproductive cough. Dyspnea slightly better. Chronic leg edema. Had BM x1 today.  Objective: Filed Vitals:   10/16/13 0352 10/16/13 0614 10/16/13 0838 10/16/13 1453  BP:  133/80 131/75 129/76  Pulse:  70 74 80  Temp:  98.4 F (36.9 C)  98.2 F (36.8 C)  TempSrc:    Oral  Resp:  20  20  Height:      Weight: 116.2 kg (256 lb 2.8 oz)     SpO2:  99%  96%    Intake/Output Summary (Last 24 hours) at 10/16/13 1630 Last data filed at 10/16/13 1454  Gross per 24 hour  Intake    480 ml  Output    300 ml  Net    180 ml   Filed Weights   10/15/13 1946 10/15/13 2312 10/16/13 0352  Weight: 121.564 kg (268 lb) 116.3 kg (256 lb 6.3 oz) 116.2 kg (256 lb 2.8 oz)     Exam:  General exam: Moderately built and morbidly obese male lying comfortably in bed Respiratory system: Clear. No increased work of breathing. Cardiovascular system: S1 & S2 heard, RRR. No JVD, murmurs, gallops, clicks. 1+ pitting bilateral leg edema Gastrointestinal system: Abdomen is obese/nondistended, soft and nontender. Normal bowel sounds heard. Central nervous system: Alert and oriented. No focal neurological deficits. Extremities: Symmetric 5 x 5 power.   Data Reviewed: Basic Metabolic Panel:  Recent Labs Lab 10/15/13 2015 10/16/13 0509  NA 136* 139  K 4.4 4.3  CL 98 100  CO2 26 28  GLUCOSE 200* 155*  BUN 45* 47*  CREATININE 1.68* 1.70*  CALCIUM 9.7 9.4   Liver Function Tests:  Recent Labs Lab 10/15/13 2015 10/16/13 0509  AST 24 23  ALT 24 22  ALKPHOS 65 50  BILITOT 0.3 0.3  PROT 7.2 6.8  ALBUMIN 3.5 3.3*    Recent Labs Lab 10/15/13 2015  LIPASE 19   No results found for this basename: AMMONIA,  in the last 168 hours CBC:  Recent Labs Lab 10/15/13 2015 10/16/13 0509  WBC 6.5 5.4  NEUTROABS 3.6  --   HGB 11.8* 10.7*  HCT 33.0* 30.1*  MCV 87.8 89.1  PLT 203  154   Cardiac Enzymes:  Recent Labs Lab 10/15/13 2015 10/15/13 2316 10/16/13 0509 10/16/13 1100  TROPONINI <0.30 <0.30 <0.30 <0.30   BNP (last 3 results)  Recent Labs  07/02/13 1436 09/12/13 1752 10/15/13 2015  PROBNP 825.0* 213.9* 221.3*   CBG:  Recent Labs Lab 10/15/13 2351 10/16/13 0741 10/16/13 1228  GLUCAP 131* 140* 230*    No results found for this or any previous visit (from the past 240 hour(s)).     Studies: Dg Chest 2 View  10/15/2013   CLINICAL DATA:  Two day history of intermittent chest pain and shortness of breath.  EXAM: CHEST  2 VIEW  COMPARISON:  DG CHEST 2 VIEW dated 09/12/2013; DG CHEST 2 VIEW dated 07/16/2013; DG CHEST 2 VIEW dated 07/02/2013; DG CHEST 2 VIEW dated 06/26/2013  FINDINGS: Suboptimal inspiration due to body habitus accounts for crowded bronchovascular markings diffusely and bilateral lower lobe atelectasis, and also accentuates the cardiac silhouette. Taking this into account, cardiac silhouette mildly enlarged but stable. Hilar and mediastinal contours otherwise unremarkable. Lungs otherwise clear. No localized airspace consolidation. No pleural effusions. No pneumothorax. Normal pulmonary vascularity. Degenerative changes involving the thoracic spine.  IMPRESSION: Suboptimal inspiration accounts for mild bilateral lower lobe atelectasis. No acute cardiopulmonary disease otherwise. Stable mild cardiomegaly without pulmonary edema.   Electronically Signed   By: Evangeline Dakin M.D.   On: 10/15/2013 20:57        Scheduled Meds: . aspirin EC  81 mg Oral QHS  . clopidogrel  75 mg Oral QAC breakfast  . docusate sodium  100 mg Oral BID  . doxazosin  2 mg Oral QHS  . heparin  5,000 Units Subcutaneous 3 times per day  . insulin aspart  0-5 Units Subcutaneous QHS  . insulin aspart  0-9 Units Subcutaneous TID WC  . insulin glargine  60 Units Subcutaneous QHS  . irbesartan  75 mg Oral Daily  . metoprolol tartrate  25 mg Oral BID  . sodium  chloride  3 mL Intravenous Q12H  . sodium chloride  3 mL Intravenous Q12H   Continuous Infusions:   Principal Problem:   Dyspnea Active Problems:   DM type 2 (diabetes mellitus, type 2)   Hypertension   Arteriosclerotic cardiovascular disease (ASCVD)   Obstructive sleep apnea   Peripheral neuropathy   Gastroesophageal reflux disease   Gastroparesis due to DM   Chronic diastolic CHF (congestive heart failure)   SOB (shortness of breath)    Time spent: 40 minutes    Hollis Tuller, MD, FACP, FHM. [facility] Pager [phone]  If 7PM-7AM, please contact night-coverage [email or web address] Password [email or web address] 10/16/2013, 4:30 PM    LOS: 1 day

## 2013-10-16 NOTE — Progress Notes (Signed)
Echo reviewed after hours. There is small to moderate circumferential pericardial effusion without evidence of tamponade physiology. Do not suspect at this time that this is the cause of his symptoms. Recommend repeat echo in 3-4 days time to follow progression, if discharged this can be arranged as an outpatient early to midweek.   [name] MD

## 2013-10-17 ENCOUNTER — Observation Stay (HOSPITAL_COMMUNITY): Payer: Medicare HMO

## 2013-10-17 DIAGNOSIS — R143 Flatulence: Secondary | ICD-10-CM

## 2013-10-17 DIAGNOSIS — I5032 Chronic diastolic (congestive) heart failure: Secondary | ICD-10-CM | POA: Diagnosis not present

## 2013-10-17 DIAGNOSIS — R142 Eructation: Secondary | ICD-10-CM

## 2013-10-17 DIAGNOSIS — R141 Gas pain: Secondary | ICD-10-CM

## 2013-10-17 DIAGNOSIS — R0989 Other specified symptoms and signs involving the circulatory and respiratory systems: Secondary | ICD-10-CM | POA: Diagnosis not present

## 2013-10-17 DIAGNOSIS — E119 Type 2 diabetes mellitus without complications: Secondary | ICD-10-CM | POA: Diagnosis not present

## 2013-10-17 DIAGNOSIS — R0609 Other forms of dyspnea: Secondary | ICD-10-CM | POA: Diagnosis not present

## 2013-10-17 LAB — CBC
HCT: 29.2 % — ABNORMAL LOW (ref 39.0–52.0)
Hemoglobin: 10.4 g/dL — ABNORMAL LOW (ref 13.0–17.0)
MCH: 31.6 pg (ref 26.0–34.0)
MCHC: 35.6 g/dL (ref 30.0–36.0)
MCV: 88.8 fL (ref 78.0–100.0)
Platelets: 152 10*3/uL (ref 150–400)
RBC: 3.29 MIL/uL — ABNORMAL LOW (ref 4.22–5.81)
RDW: 12.8 % (ref 11.5–15.5)
WBC: 4.1 10*3/uL (ref 4.0–10.5)

## 2013-10-17 LAB — GLUCOSE, CAPILLARY
Glucose-Capillary: 207 mg/dL — ABNORMAL HIGH (ref 70–99)
Glucose-Capillary: 223 mg/dL — ABNORMAL HIGH (ref 70–99)
Glucose-Capillary: 218 mg/dL — ABNORMAL HIGH (ref 70–99)

## 2013-10-17 LAB — BASIC METABOLIC PANEL
BUN: 51 mg/dL — ABNORMAL HIGH (ref 6–23)
CHLORIDE: 97 meq/L (ref 96–112)
CO2: 27 mEq/L (ref 19–32)
CREATININE: 1.73 mg/dL — AB (ref 0.50–1.35)
Calcium: 9.2 mg/dL (ref 8.4–10.5)
GFR, EST AFRICAN AMERICAN: 49 mL/min — AB (ref >90)
GFR, EST NON AFRICAN AMERICAN: 42 mL/min — AB (ref >90)
Glucose, Bld: 212 mg/dL — ABNORMAL HIGH (ref 70–99)
POTASSIUM: 4.2 meq/L (ref 3.7–5.3)
Sodium: 135 mEq/L — ABNORMAL LOW (ref 137–147)

## 2013-10-17 LAB — D-DIMER, QUANTITATIVE: D-Dimer, Quant: 0.39 ug/mL-FEU (ref 0.00–0.48)

## 2013-10-17 MED ORDER — POLYETHYLENE GLYCOL 3350 17 G PO PACK: 17.0000 g | PACK | Freq: Two times a day (BID) | ORAL | Status: DC

## 2013-10-17 MED ORDER — INSULIN ASPART 100 UNIT/ML ~~LOC~~ SOLN
3.0000 [IU] | Freq: Three times a day (TID) | SUBCUTANEOUS | Status: DC
  Administered 2013-10-17 (×2): 3 [IU] via SUBCUTANEOUS

## 2013-10-17 MED ORDER — SENNA 8.6 MG PO TABS
2.0000 | ORAL_TABLET | Freq: Two times a day (BID) | ORAL | Status: DC
  Administered 2013-10-17: 17.2 mg via ORAL
  Filled 2013-10-17 (×2): qty 2

## 2013-10-17 MED ORDER — IOHEXOL 300 MG/ML  SOLN
50.0000 mL | Freq: Once | INTRAMUSCULAR | Status: AC | PRN
  Administered 2013-10-17: 50 mL via ORAL

## 2013-10-17 NOTE — Progress Notes (Signed)
Pt is due for his BP medicine. BP is 101/62 and HR is 74. MD was notified and MD stated it is safe to give pt his BP medications. Pt was agreeable to taking his metoprolol but refused his irbesartan. Will continue to monitor.

## 2013-10-17 NOTE — Progress Notes (Signed)
Pt is to be discharged home today. Pt is in NAD, IV is out, all paperwork has been reviewed/discussed with patient, and there are no questions/concerns at this time. Assessment is unchanged from this morning. Pt is to be accompanied downstairs by staff and family via wheelchair.  

## 2013-10-17 NOTE — Progress Notes (Addendum)
PROGRESS NOTE    Eugene [name] [id number] DOB: 03/23/1956 DOA: 10/15/2013 PCP: [name], MD  HPI/Brief narrative 58 year old male with history of CAD, status post RCA stent 2014, pericardial effusion status post window, DM 2, HTN, OSA on nightly CPAP, chronic diastolic CHF presents with 4-5 days history of progressively worsening dyspnea at rest and with activity, 7 pound weight gain over the last 2 months and chronic lower extremity edema. Patient has mild intermittent dry cough. He was admitted for evaluation of dyspnea. He also complains of abdominal distention but has normal BM and no abdominal pain.  Assessment/Plan:  1. Dyspnea: Unclear etiology. DD-? Acute bronchitis complicating underlying OSA, possible mild acute on chronic diastolic CHF. Cardiology input appreciated-not convinced that dyspnea is related to ischemia, EKG without acute findings, negative troponins and not convinced about CHF. As per cardiology: 2-D echo shows small to moderate circumferential pericardial effusion without evidence of tamponade physiology and recommend repeat echo in 3-4 days time. Dyspnea? Multifactorial from OSA, obesity, abdominal distention, chronic diastolic CHF & pericardial effusion but not sure what has caused worsening in the last 4-5 days. Check d-dimer and if positive- obtain VQ scan. 2. Abdominal fullness: No acute findings on exam apart from obese abdomen/distention. CT abdomen of October 2014 unremarkable. LFTs not significant. Patient continued to complain of abdominal discomfort and felt that something was "pushing up" causing difficulty breathing and cough. CT abdomen and pelvis without contrast repeated in no acute findings to explain symptoms. 3. History of CAD: Ruled out for MI. No chest pain. Management per cardiology. 4. OSA: Continue nightly CPAP. 5. Chronic diastolic CHF: Received a dose of Lasix might of admission with mild increase in creatinine. Management per  cardiology. Lasix held 6. Stage III chronic kidney disease: Baseline creatinine probably in the 1.3-1.5 range. Slightly elevated on arrival and worsened by Lasix. Creatinine has plateaued over the last 48 hours. Hold ARB. Follow BMP.  7. Hypertension: Controlled 8. Anemia of chronic kidney disease: Seems stable.  9. Uncontrolled DM 2 with renal complications: Patient was placed on a slightly reduced dose of Lantus compared to home dose 60 units qHS (home dose 80 units qHS). Add mealtime NovoLog and continue SSI. 10. Pericardial effusion: Management as above.   Code Status: Full Family Communication: None at bedside Disposition Plan: Home when medically stable   Consultants:  Cardiology  Procedures:  None  Antibiotics:  None   Subjective: Continues to complain of dyspnea on minimal exertion-walking to bathroom, room door, talking or eating. Complains of abdominal distention and LUQ discomfort. No nausea or vomiting. No BM since yesterday. No significant cough.  Objective: Filed Vitals:   10/16/13 2021 10/17/13 0634 10/17/13 0811 10/17/13 0847  BP:  119/74  101/62  Pulse: 75 68  74  Temp:  98.6 F (37 C)    TempSrc:  Oral    Resp: 20 20    Height:      Weight:      SpO2: 95% 95% 98%     Intake/Output Summary (Last 24 hours) at 10/17/13 1509 Last data filed at 10/17/13 0857  Gross per 24 hour  Intake    476 ml  Output    650 ml  Net   -174 ml   Filed Weights   10/15/13 1946 10/15/13 2312 10/16/13 0352  Weight: 121.564 kg (268 lb) 116.3 kg (256 lb 6.3 oz) 116.2 kg (256 lb 2.8 oz)     Exam:  General exam: Moderately built and morbidly obese male sitting  up comfortably in bed. Respiratory system: Clear. No increased work of breathing. Able to speak comfortably in full sentences. Cardiovascular system: S1 & S2 heard, RRR. No JVD, murmurs, gallops, clicks. 1+ pitting bilateral leg edema Gastrointestinal system: Abdomen is obese/nondistended, soft and nontender. ??  Mild LUQ tenderness without peritoneal signs. Normal bowel sounds heard. Central nervous system: Alert and oriented. No focal neurological deficits. Extremities: Symmetric 5 x 5 power.   Data Reviewed: Basic Metabolic Panel:  Recent Labs Lab 10/15/13 2015 10/16/13 0509 10/17/13 0546  NA 136* 139 135*  K 4.4 4.3 4.2  CL 98 100 97  CO2 26 28 27   GLUCOSE 200* 155* 212*  BUN 45* 47* 51*  CREATININE 1.68* 1.70* 1.73*  CALCIUM 9.7 9.4 9.2   Liver Function Tests:  Recent Labs Lab 10/15/13 2015 10/16/13 0509  AST 24 23  ALT 24 22  ALKPHOS 65 50  BILITOT 0.3 0.3  PROT 7.2 6.8  ALBUMIN 3.5 3.3*    Recent Labs Lab 10/15/13 2015  LIPASE 19   No results found for this basename: AMMONIA,  in the last 168 hours CBC:  Recent Labs Lab 10/15/13 2015 10/16/13 0509 10/17/13 0546  WBC 6.5 5.4 4.1  NEUTROABS 3.6  --   --   HGB 11.8* 10.7* 10.4*  HCT 33.0* 30.1* 29.2*  MCV 87.8 89.1 88.8  PLT 203 154 152   Cardiac Enzymes:  Recent Labs Lab 10/15/13 2015 10/15/13 2316 10/16/13 0509 10/16/13 1100  TROPONINI <0.30 <0.30 <0.30 <0.30   BNP (last 3 results)  Recent Labs  07/02/13 1436 09/12/13 1752 10/15/13 2015  PROBNP 825.0* 213.9* 221.3*   CBG:  Recent Labs Lab 10/16/13 0741 10/16/13 1228 10/16/13 1958 10/17/13 0754 10/17/13 1147  GLUCAP 140* 230* 219* 207* 223*    No results found for this or any previous visit (from the past 240 hour(s)).     Studies: Ct Abdomen Pelvis Wo Contrast  10/17/2013   CLINICAL DATA:  Upper abdominal pain and distention, left upper quadrant pain  EXAM: CT ABDOMEN AND PELVIS WITHOUT CONTRAST  TECHNIQUE: Multidetector CT imaging of the abdomen and pelvis was performed following the standard protocol without IV contrast.  COMPARISON:  06/18/2013, 06/01/2012  FINDINGS: Minor basilar atelectasis. Small pericardial effusion noted, smaller than the prior exam of 06/18/2013. Mild cardiac enlargement. Coronary stents noted. No  basilar pneumonia. Negative for hiatal hernia.  Abdomen: Tiny incidental calcified gallstones in the gallbladder, image 28. Liver, biliary system, pancreas, spleen, and adrenal glands demonstrate no acute finding and are within normal limits for noncontrast imaging. Kidneys demonstrate chronic perinephric stranding. No renal obstruction, hydronephrosis, or obstructing ureteral calculus on either side by CT.  Negative for bowel obstruction, dilatation, ileus, or free air.  No abdominal free fluid, fluid collection, hemorrhage, abscess, or adenopathy.  Aortic atherosclerosis without aneurysm.  Normal appendix demonstrated.  Pelvis: Urinary bladder is collapsed. No pelvic free fluid, fluid collection, hemorrhage, abscess, adenopathy, inguinal abnormality, or hernia. Injection sites noted in the lower abdominal wall.  Degenerative changes of the spine and pelvis. No acute osseous finding.  IMPRESSION: Cardiomegaly with a small pericardial effusion.  Incidental cholelithiasis  No acute obstructive uropathy, hydronephrosis, or obstructing ureteral calculus on either side   Electronically Signed   By: Daryll Brod M.D.   On: 10/17/2013 13:45   Dg Chest 2 View  10/15/2013   CLINICAL DATA:  Two day history of intermittent chest pain and shortness of breath.  EXAM: CHEST  2 VIEW  COMPARISON:  DG CHEST 2 VIEW dated 09/12/2013; DG CHEST 2 VIEW dated 07/16/2013; DG CHEST 2 VIEW dated 07/02/2013; DG CHEST 2 VIEW dated 06/26/2013  FINDINGS: Suboptimal inspiration due to body habitus accounts for crowded bronchovascular markings diffusely and bilateral lower lobe atelectasis, and also accentuates the cardiac silhouette. Taking this into account, cardiac silhouette mildly enlarged but stable. Hilar and mediastinal contours otherwise unremarkable. Lungs otherwise clear. No localized airspace consolidation. No pleural effusions. No pneumothorax. Normal pulmonary vascularity. Degenerative changes involving the thoracic spine.   IMPRESSION: Suboptimal inspiration accounts for mild bilateral lower lobe atelectasis. No acute cardiopulmonary disease otherwise. Stable mild cardiomegaly without pulmonary edema.   Electronically Signed   By: Evangeline Dakin M.D.   On: 10/15/2013 20:57        Scheduled Meds: . aspirin EC  81 mg Oral QHS  . clopidogrel  75 mg Oral QAC breakfast  . docusate sodium  100 mg Oral BID  . doxazosin  2 mg Oral QHS  . heparin  5,000 Units Subcutaneous 3 times per day  . insulin aspart  0-5 Units Subcutaneous QHS  . insulin aspart  0-9 Units Subcutaneous TID WC  . insulin aspart  3 Units Subcutaneous TID WC  . insulin glargine  60 Units Subcutaneous QHS  . irbesartan  75 mg Oral Daily  . metoprolol tartrate  25 mg Oral BID  . polyethylene glycol  17 g Oral BID  . senna  2 tablet Oral BID  . sodium chloride  3 mL Intravenous Q12H  . sodium chloride  3 mL Intravenous Q12H   Continuous Infusions:   Principal Problem:   Dyspnea Active Problems:   DM type 2 (diabetes mellitus, type 2)   Hypertension   Arteriosclerotic cardiovascular disease (ASCVD)   Obstructive sleep apnea   Peripheral neuropathy   Gastroesophageal reflux disease   Gastroparesis due to DM   Chronic diastolic CHF (congestive heart failure)   SOB (shortness of breath)    Time spent: 40 minutes    Marcanthony Sleight, MD, FACP, FHM. [facility] Pager [phone]  If 7PM-7AM, please contact night-coverage [email or web address] Password [email or web address] 10/17/2013, 3:09 PM    LOS: 2 days

## 2013-10-17 NOTE — Discharge Summary (Signed)
Physician Discharge Summary  Alphonza Feltham Girgenti [id number] DOB: June 16, 1956 DOA: 10/15/2013  PCP: [name], MD  Admit date: 10/15/2013 Discharge date: 10/17/2013  Time spent: Less than 30 minutes  Recommendations for Outpatient Follow-up:  1. Dr. Kate Sable, Cardiology in 2 days -to be seen with repeat labs (CBC & BMP) and 2-D echo (followup of pericardial effusion). Decision to be made during this visit regarding resuming diuretics and ARB which were held in the hospital. 2. Dr. [name], PCP in 1 week.  Discharge Diagnoses:  Principal Problem:   Dyspnea Active Problems:   DM type 2 (diabetes mellitus, type 2)   Hypertension   Arteriosclerotic cardiovascular disease (ASCVD)   Obstructive sleep apnea   Peripheral neuropathy   Gastroesophageal reflux disease   Gastroparesis due to DM   Chronic diastolic CHF (congestive heart failure)   SOB (shortness of breath)   Discharge Condition: Improved & Stable  Diet recommendation: Heart Healthy & Diabetic diet.  Filed Weights   10/15/13 1946 10/15/13 2312 10/16/13 0352  Weight: 121.564 kg (268 lb) 116.3 kg (256 lb 6.3 oz) 116.2 kg (256 lb 2.8 oz)    History of present illness:  58 year old male with history of CAD, status post RCA stent 2014, pericardial effusion status post window, DM 2, HTN, OSA on nightly CPAP, chronic diastolic CHF presents with 4-5 days history of progressively worsening dyspnea at rest and with activity, 7 pound weight gain over the last 2 months and chronic lower extremity edema. Patient has mild intermittent dry cough. He was admitted for evaluation of dyspnea. He also complains of abdominal distention but has normal BM and no abdominal pain.   Hospital Course:   1. Dyspnea: Unclear etiology. Cardiology input appreciated-not convinced that dyspnea is related to ischemia, EKG without acute findings, negative troponins and not convinced about CHF. As per cardiology: 2-D echo shows small to  moderate circumferential pericardial effusion without evidence of tamponade physiology and recommend repeat echo in 3-4 days time. Etiology :? Multifactorial from OSA, obesity, abdominal distention, chronic diastolic CHF & pericardial effusion but not sure what has caused worsening in the last 4-5 days. D-dimer negative. Since no further interventions planned in the hospital, patient anxious to go home. Advised to seek immediate medical attention in case there is any decline and patient verbalized understanding. 2. Abdominal fullness: No acute findings on exam apart from obese abdomen/distention. CT abdomen of October 2014 unremarkable. LFTs not significant. Patient continued to complain of abdominal discomfort and felt that something was "pushing up" causing difficulty breathing and cough. CT abdomen and pelvis without contrast repeated & no acute findings to explain symptoms. 3. History of CAD: Ruled out for MI. No chest pain. Management per cardiology. 4. OSA: Continue nightly CPAP. 5. Chronic diastolic CHF: Received a dose of Lasix night of admission with mild increase in creatinine. Management per cardiology. Diuretics have been temporarily held since 2/19. Patient appears clinically compensated. Followup with primary cardiologist in the next 2-3 days and decision to be made as to when to resume. 6. Stage III chronic kidney disease: Baseline creatinine probably in the 1.3-1.5 range. Slightly elevated on arrival and worsened by Lasix. Creatinine has plateaued over the last 48 hours. Holding ARB since 2/21. Followup with cardiology in the next 2-3 days with repeat BMP. 7. Hypertension: Controlled 8. Anemia of chronic kidney disease: Seems stable.  9. Uncontrolled DM 2 with renal complications: Patient was placed on a slightly reduced dose of Lantus compared to home dose 60 units qHS (home  dose 80 units qHS). Resume home dose at discharge. 10. Pericardial effusion: Management as above. No clinical or echo  findings of tamponade.  Consultations:  Cardiology  Procedures:  None    Discharge Exam:  Complaints:  Persisting dyspnea but patient anxious to go home since no further interventions planned in the hospital.  Filed Vitals:   10/16/13 2021 10/17/13 0634 10/17/13 0811 10/17/13 0847  BP:  119/74  101/62  Pulse: 75 68  74  Temp:  98.6 F (37 C)    TempSrc:  Oral    Resp: 20 20    Height:      Weight:      SpO2: 95% 95% 98%     General exam: Moderately built and morbidly obese male sitting up comfortably in bed.  Respiratory system: Clear. No increased work of breathing. Able to speak comfortably in full sentences.  Cardiovascular system: S1 & S2 heard, RRR. No JVD, murmurs, gallops, clicks. 1+ pitting bilateral leg edema  Gastrointestinal system: Abdomen is obese/nondistended, soft and nontender. ?? Mild LUQ tenderness without peritoneal signs. Normal bowel sounds heard.  Central nervous system: Alert and oriented. No focal neurological deficits.  Extremities: Symmetric 5 x 5 power.   Discharge Instructions      Discharge Orders   Future Appointments Provider Department Dept Phone   10/26/2013 3:40 PM Herminio Commons, MD [place] [phone]   Future Orders Complete By Expires   (HEART FAILURE PATIENTS) Call MD:  Anytime you have any of the following symptoms: 1) 3 pound weight gain in 24 hours or 5 pounds in 1 week 2) shortness of breath, with or without a dry hacking cough 3) swelling in the hands, feet or stomach 4) if you have to sleep on extra pillows at night in order to breathe.  As directed    Call MD for:  difficulty breathing, headache or visual disturbances  As directed    Call MD for:  extreme fatigue  As directed    Call MD for:  persistant dizziness or light-headedness  As directed    Call MD for:  severe uncontrolled pain  As directed    Diet - low sodium heart healthy  As directed    Diet Carb Modified  As directed     Discharge instructions  As directed    Comments:     Continue nightly CPAP.   Increase activity slowly  As directed        Medication List    STOP taking these medications       torsemide 20 MG tablet  Commonly known as:  DEMADEX     valsartan 80 MG tablet  Commonly known as:  DIOVAN      TAKE these medications       ALPRAZolam 0.5 MG tablet  Commonly known as:  XANAX  Take one half to one tablet by mouth at bedtime as needed for sleep.     aspirin EC 81 MG tablet  Take 81 mg by mouth at bedtime.     bisacodyl 5 MG EC tablet  Commonly known as:  DULCOLAX  Take 2 tablets (10 mg total) by mouth daily as needed for constipation.     clopidogrel 75 MG tablet  Commonly known as:  PLAVIX  Take 1 tablet (75 mg total) by mouth daily.     doxazosin 4 MG tablet  Commonly known as:  CARDURA  Take 2 mg by mouth at bedtime.  fenofibrate 160 MG tablet  Take 160 mg by mouth at bedtime.     Flaxseed Oil 1000 MG Caps  Take 1,000 mg by mouth daily.     LANTUS SOLOSTAR 100 UNIT/ML Solostar Pen  Generic drug:  Insulin Glargine  Inject 80 Units into the skin at bedtime.     metoprolol tartrate 25 MG tablet  Commonly known as:  LOPRESSOR  Take 1 tablet (25 mg total) by mouth 2 (two) times daily.     nitroGLYCERIN 0.4 MG SL tablet  Commonly known as:  NITROSTAT  Place 1 tablet (0.4 mg total) under the tongue every 5 (five) minutes x 3 doses as needed for chest pain.       Follow-up Information   Follow up with Kate Sable A, MD. Schedule an appointment as soon as possible for a visit in 2 days. (Call on 10/19/13 to be seen same day or the next day with repeat labs (CBC & BMP) & echocardiogram.)    Specialty:  Cardiology   Contact information:   [address] [place] [address] [phone]       Follow up with [name], MD. Schedule an appointment as soon as possible for a visit in 1 week.   Specialty:  Family Medicine   Contact information:   [address] [place] [address] [phone]        The results of significant diagnostics from this hospitalization (including imaging, microbiology, ancillary and laboratory) are listed below for reference.    Significant Diagnostic Studies: Ct Abdomen Pelvis Wo Contrast  10/17/2013   CLINICAL DATA:  Upper abdominal pain and distention, left upper quadrant pain  EXAM: CT ABDOMEN AND PELVIS WITHOUT CONTRAST  TECHNIQUE: Multidetector CT imaging of the abdomen and pelvis was performed following the standard protocol without IV contrast.  COMPARISON:  06/18/2013, 06/01/2012  FINDINGS: Minor basilar atelectasis. Small pericardial effusion noted, smaller than the prior exam of 06/18/2013. Mild cardiac enlargement. Coronary stents noted. No basilar pneumonia. Negative for hiatal hernia.  Abdomen: Tiny incidental calcified gallstones in the gallbladder, image 28. Liver, biliary system, pancreas, spleen, and adrenal glands demonstrate no acute finding and are within normal limits for noncontrast imaging. Kidneys demonstrate chronic perinephric stranding. No renal obstruction, hydronephrosis, or obstructing ureteral calculus on either side by CT.  Negative for bowel obstruction, dilatation, ileus, or free air.  No abdominal free fluid, fluid collection, hemorrhage, abscess, or adenopathy.  Aortic atherosclerosis without aneurysm.  Normal appendix demonstrated.  Pelvis: Urinary bladder is collapsed. No pelvic free fluid, fluid collection, hemorrhage, abscess, adenopathy, inguinal abnormality, or hernia. Injection sites noted in the lower abdominal wall.  Degenerative changes of the spine and pelvis. No acute osseous finding.  IMPRESSION: Cardiomegaly with a small pericardial effusion.  Incidental cholelithiasis  No acute obstructive uropathy, hydronephrosis, or obstructing ureteral calculus on either side   Electronically Signed   By: Daryll Brod M.D.   On: 10/17/2013 13:45   Dg Chest 2  View  10/15/2013   CLINICAL DATA:  Two day history of intermittent chest pain and shortness of breath.  EXAM: CHEST  2 VIEW  COMPARISON:  DG CHEST 2 VIEW dated 09/12/2013; DG CHEST 2 VIEW dated 07/16/2013; DG CHEST 2 VIEW dated 07/02/2013; DG CHEST 2 VIEW dated 06/26/2013  FINDINGS: Suboptimal inspiration due to body habitus accounts for crowded bronchovascular markings diffusely and bilateral lower lobe atelectasis, and also accentuates the cardiac silhouette. Taking this into account, cardiac silhouette mildly enlarged but stable. Hilar  and mediastinal contours otherwise unremarkable. Lungs otherwise clear. No localized airspace consolidation. No pleural effusions. No pneumothorax. Normal pulmonary vascularity. Degenerative changes involving the thoracic spine.  IMPRESSION: Suboptimal inspiration accounts for mild bilateral lower lobe atelectasis. No acute cardiopulmonary disease otherwise. Stable mild cardiomegaly without pulmonary edema.   Electronically Signed   By: Evangeline Dakin M.D.   On: 10/15/2013 20:57    Microbiology: No results found for this or any previous visit (from the past 240 hour(s)).   Labs: Basic Metabolic Panel:  Recent Labs Lab 10/15/13 2015 10/16/13 0509 10/17/13 0546  NA 136* 139 135*  K 4.4 4.3 4.2  CL 98 100 97  CO2 26 28 27   GLUCOSE 200* 155* 212*  BUN 45* 47* 51*  CREATININE 1.68* 1.70* 1.73*  CALCIUM 9.7 9.4 9.2   Liver Function Tests:  Recent Labs Lab 10/15/13 2015 10/16/13 0509  AST 24 23  ALT 24 22  ALKPHOS 65 50  BILITOT 0.3 0.3  PROT 7.2 6.8  ALBUMIN 3.5 3.3*    Recent Labs Lab 10/15/13 2015  LIPASE 19   No results found for this basename: AMMONIA,  in the last 168 hours CBC:  Recent Labs Lab 10/15/13 2015 10/16/13 0509 10/17/13 0546  WBC 6.5 5.4 4.1  NEUTROABS 3.6  --   --   HGB 11.8* 10.7* 10.4*  HCT 33.0* 30.1* 29.2*  MCV 87.8 89.1 88.8  PLT 203 154 152   Cardiac Enzymes:  Recent Labs Lab 10/15/13 2015  10/15/13 2316 10/16/13 0509 10/16/13 1100  TROPONINI <0.30 <0.30 <0.30 <0.30   BNP: BNP (last 3 results)  Recent Labs  07/02/13 1436 09/12/13 1752 10/15/13 2015  PROBNP 825.0* 213.9* 221.3*   CBG:  Recent Labs Lab 10/16/13 1228 10/16/13 1958 10/17/13 0754 10/17/13 1147 10/17/13 1701  GLUCAP 230* 219* 207* 223* 218*    Additional labs: 1. 2-D echo 10/16/13: Study Conclusions  - Study data: This is a limited study to evaluate pericardial effusion. - Left ventricle: The cavity size was normal, consistent with septal shift. Systolic function was normal. The estimated ejection fraction was in the range of 60% to 65%. - Pericardium, extracardiac: There is a small to moderate sized circumferential pericardial effusion. It measures 1.4 cm adjacent to the left ventricle anterolateral wall. There is no significant respiratory inflow variation. The IVC is poorly visualized but appears normal. There is no visual evidence of RA/RV early diastolic collapse.Overall findings are not consistent with tamponade physiology. Recommend follow up limited echo in 3-4 days to follow progression     Signed:  Vernell Leep, MD, FACP, FHM. [facility] Pager [phone]  If 7PM-7AM, please contact night-coverage [email or web address] Password [facility] 10/17/2013, 6:02 PM

## 2013-10-17 NOTE — Progress Notes (Signed)
Utilization Review completed.  

## 2013-10-22 ENCOUNTER — Other Ambulatory Visit: Payer: Self-pay | Admitting: Family Medicine

## 2013-10-22 ENCOUNTER — Other Ambulatory Visit: Payer: Self-pay | Admitting: Adult Health

## 2013-10-26 ENCOUNTER — Ambulatory Visit: Payer: PRIVATE HEALTH INSURANCE | Admitting: Cardiovascular Disease

## 2013-10-26 ENCOUNTER — Other Ambulatory Visit: Payer: Self-pay | Admitting: Family Medicine

## 2013-10-26 NOTE — Telephone Encounter (Signed)
May give this plus one refill he needs an office visit before further scripts

## 2013-10-26 NOTE — Telephone Encounter (Signed)
May refill this +4 additional refills 

## 2013-10-26 NOTE — Telephone Encounter (Signed)
Last seen 08/12/13

## 2013-10-28 ENCOUNTER — Ambulatory Visit: Payer: Medicare PPO | Admitting: Family Medicine

## 2013-11-06 ENCOUNTER — Ambulatory Visit (INDEPENDENT_AMBULATORY_CARE_PROVIDER_SITE_OTHER): Payer: Medicare PPO | Admitting: Family Medicine

## 2013-11-06 ENCOUNTER — Encounter: Payer: Self-pay | Admitting: Family Medicine

## 2013-11-06 VITALS — BP 152/82 | Ht 73.0 in | Wt 262.0 lb

## 2013-11-06 DIAGNOSIS — R0600 Dyspnea, unspecified: Secondary | ICD-10-CM

## 2013-11-06 DIAGNOSIS — D649 Anemia, unspecified: Secondary | ICD-10-CM

## 2013-11-06 DIAGNOSIS — E785 Hyperlipidemia, unspecified: Secondary | ICD-10-CM

## 2013-11-06 DIAGNOSIS — E119 Type 2 diabetes mellitus without complications: Secondary | ICD-10-CM

## 2013-11-06 DIAGNOSIS — R0609 Other forms of dyspnea: Secondary | ICD-10-CM

## 2013-11-06 DIAGNOSIS — I10 Essential (primary) hypertension: Secondary | ICD-10-CM

## 2013-11-06 DIAGNOSIS — R0989 Other specified symptoms and signs involving the circulatory and respiratory systems: Secondary | ICD-10-CM

## 2013-11-06 LAB — CBC WITH DIFFERENTIAL/PLATELET
Basophils Absolute: 0.1 10*3/uL (ref 0.0–0.1)
Basophils Relative: 1 % (ref 0–1)
EOS ABS: 0.1 10*3/uL (ref 0.0–0.7)
Eosinophils Relative: 2 % (ref 0–5)
HEMATOCRIT: 34.2 % — AB (ref 39.0–52.0)
HEMOGLOBIN: 11.8 g/dL — AB (ref 13.0–17.0)
LYMPHS ABS: 1.9 10*3/uL (ref 0.7–4.0)
Lymphocytes Relative: 27 % (ref 12–46)
MCH: 30.5 pg (ref 26.0–34.0)
MCHC: 34.5 g/dL (ref 30.0–36.0)
MCV: 88.4 fL (ref 78.0–100.0)
MONO ABS: 0.6 10*3/uL (ref 0.1–1.0)
MONOS PCT: 8 % (ref 3–12)
NEUTROS PCT: 62 % (ref 43–77)
Neutro Abs: 4.3 10*3/uL (ref 1.7–7.7)
Platelets: 215 10*3/uL (ref 150–400)
RBC: 3.87 MIL/uL — AB (ref 4.22–5.81)
RDW: 13.6 % (ref 11.5–15.5)
WBC: 6.9 10*3/uL (ref 4.0–10.5)

## 2013-11-06 LAB — POCT GLYCOSYLATED HEMOGLOBIN (HGB A1C): HEMOGLOBIN A1C: 8.9

## 2013-11-06 NOTE — Progress Notes (Signed)
Subjective:    Patient ID: Eugene [name], male    DOB: 1956-04-03, 58 y.o.   MRN: [id number]  HPI Patient is here today for a f/u on ER visit back in Feb d/t COPD. hospital records were reviewed while the patient was present and discuss with him in detail.  Pt concerned about a bulge on his left side abdomen. He feels like something is pushing from the inside, out.  He does have hypoactive bowel sounds. He said he has a HX of constipation. Patient did have encopresis. He is on flaxseed. He wonders if this is contributing to his problem  He didn't know if he needed another coloscopy? This was talked at length with him. I do not feel he needs another colonoscopy currently last one was 2001 was normal he is not in a high risk category. This could change based around blood work results  The patient was seen today as part of a comprehensive diabetic check up. The patient had the following elements completed: -Review of medication compliance -Review of glucose monitoring results -Review of any complications do to high or low sugars -Diabetic foot exam was completed as part of today's visit. The following was also discussed: -Importance of yearly eye exams -Importance of following diabetic/low sugar-starch diet -Importance of exercise and regular activity -Importance of regular followup visits. -Most recent hemoglobin A1c were reviewed with the patient along with goals regarding diabetes.    Review of Systems  Constitutional: Negative for activity change, appetite change and fatigue.  HENT: Negative for congestion and rhinorrhea.   Respiratory: Negative for cough, shortness of breath and wheezing.   Cardiovascular: Negative for chest pain and leg swelling.  Gastrointestinal: Negative for abdominal pain and abdominal distention.  Endocrine: Negative for polydipsia and polyphagia.  Neurological: Negative for weakness, numbness and headaches.  Psychiatric/Behavioral: Negative for  confusion and agitation.       Objective:   Physical Exam  Vitals reviewed. Constitutional: He appears well-nourished. No distress.  Cardiovascular: Normal rate, regular rhythm and normal heart sounds.   No murmur heard. Pulmonary/Chest: Effort normal and breath sounds normal. No respiratory distress.  Abdominal: Soft. He exhibits no distension and no mass. There is no tenderness. There is no rebound and no guarding.  Musculoskeletal: He exhibits no edema.  Lymphadenopathy:    He has no cervical adenopathy.  Neurological: He is alert.  Psychiatric: His behavior is normal.          Assessment & Plan:  #1 abdominal discomfort he describes as feeling of fullness in the left upper quadrant get a CAT scan while he is in the hospital and did not show any tumors patient had a colonoscopy 4 years ago. Patient had negative Hemoccults. We will be doing some blood work on him await the results.  #2 diabetes fair control could be doing better it was doing very well he states he's got work hard to try to get it back to where it was  #3 heart disease stable recheck her blood pressure showed an improvement. He is to work hard on diet exercise and will followup again in 3 months time he will also followup with cardiology as planned. Patient recently restarted Valsartan , he will do blood work in the near future.  #4 renal insufficiency chronic kidney disease await the metabolic 7  #5 patient with encopresis I feel that this patient would benefit from stopping flaxseed. Minimize starches in the diet. Will look at lipid profile again in approximately 3  months

## 2013-11-07 LAB — IRON AND TIBC
%SAT: 29 % (ref 20–55)
IRON: 88 ug/dL (ref 42–165)
TIBC: 304 ug/dL (ref 215–435)
UIBC: 216 ug/dL (ref 125–400)

## 2013-11-07 LAB — MAGNESIUM: MAGNESIUM: 1.8 mg/dL (ref 1.5–2.5)

## 2013-11-07 LAB — BASIC METABOLIC PANEL
BUN: 44 mg/dL — ABNORMAL HIGH (ref 6–23)
CO2: 26 meq/L (ref 19–32)
CREATININE: 1.54 mg/dL — AB (ref 0.50–1.35)
Calcium: 9.2 mg/dL (ref 8.4–10.5)
Chloride: 99 mEq/L (ref 96–112)
Glucose, Bld: 255 mg/dL — ABNORMAL HIGH (ref 70–99)
POTASSIUM: 5.2 meq/L (ref 3.5–5.3)
SODIUM: 135 meq/L (ref 135–145)

## 2013-11-07 LAB — FERRITIN: FERRITIN: 491 ng/mL — AB (ref 22–322)

## 2013-11-11 ENCOUNTER — Ambulatory Visit (INDEPENDENT_AMBULATORY_CARE_PROVIDER_SITE_OTHER): Payer: Medicare HMO | Admitting: Cardiovascular Disease

## 2013-11-11 ENCOUNTER — Encounter: Payer: Self-pay | Admitting: Cardiovascular Disease

## 2013-11-11 VITALS — BP 161/98 | HR 89 | Ht 73.0 in | Wt 261.0 lb

## 2013-11-11 DIAGNOSIS — I319 Disease of pericardium, unspecified: Secondary | ICD-10-CM

## 2013-11-11 DIAGNOSIS — E782 Mixed hyperlipidemia: Secondary | ICD-10-CM

## 2013-11-11 DIAGNOSIS — R600 Localized edema: Secondary | ICD-10-CM

## 2013-11-11 DIAGNOSIS — Z9861 Coronary angioplasty status: Secondary | ICD-10-CM

## 2013-11-11 DIAGNOSIS — Z955 Presence of coronary angioplasty implant and graft: Secondary | ICD-10-CM

## 2013-11-11 DIAGNOSIS — I5032 Chronic diastolic (congestive) heart failure: Secondary | ICD-10-CM

## 2013-11-11 DIAGNOSIS — I313 Pericardial effusion (noninflammatory): Secondary | ICD-10-CM

## 2013-11-11 DIAGNOSIS — I10 Essential (primary) hypertension: Secondary | ICD-10-CM

## 2013-11-11 DIAGNOSIS — R609 Edema, unspecified: Secondary | ICD-10-CM

## 2013-11-11 DIAGNOSIS — I3139 Other pericardial effusion (noninflammatory): Secondary | ICD-10-CM

## 2013-11-11 DIAGNOSIS — J449 Chronic obstructive pulmonary disease, unspecified: Secondary | ICD-10-CM

## 2013-11-11 DIAGNOSIS — I2510 Atherosclerotic heart disease of native coronary artery without angina pectoris: Secondary | ICD-10-CM

## 2013-11-11 MED ORDER — HYDRALAZINE HCL 25 MG PO TABS: 25.0000 mg | ORAL_TABLET | Freq: Two times a day (BID) | ORAL | Status: DC

## 2013-11-11 NOTE — Patient Instructions (Signed)
   Begin Hydralazine 25mg  twice a day  - new sent to pharm Continue all other medications.   Your physician has requested that you have an echocardiogram. Echocardiography is a painless test that uses sound waves to create images of your heart. It provides your doctor with information about the size and shape of your heart and how well your heart's chambers and valves are working. This procedure takes approximately one hour. There are no restrictions for this procedure. Office will contact with results via phone or letter.   Follow up in  3-4 months

## 2013-11-11 NOTE — Progress Notes (Signed)
Patient ID: Eugene [name], male   DOB: 1956-05-30, 58 y.o.   MRN: [id number]      SUBJECTIVE: The patient was hospitalized in February for increasing dyspnea on exertion. My colleague Dr. [name] saw him in consultation and did not feel it was cardiac in etiology, specifically not feeling that he had signs of volume overload or ischemia. A repeat echocardiogram showed a small to moderate circumferential pericardial effusion with no evidence of tamponade physiology, EF 60-65%.  BUN/creatinine 44/1.54 on 3/13. He saw his pulmonologist and it was deemed his shortness of breath was due to COPD. He is now on Symbicort twice daily and is feeling much better. He denies chest pain. He denies increased leg swelling and palpitations. He checks his blood pressure at home and most readings are in the [id number] to [id number] systolic range but has had at least 4 readings in the 140-150 range. He has had his blood pressure cuff checked to make sure it is calibrated appropriately at his PCP's office. He previously tried taking valsartan 80 mg in the morning and 40 mg in the evening, but this led to lightheadedness and dizziness. He has not had a repeat echocardiogram to assess his pericardial effusion.   Allergies  Allergen Reactions  . Hydrocodone Nausea And Vomiting  . Lisinopril Cough  . Neurontin [Gabapentin]     Side effects, suicidal thoughts  . Statins Other (See Comments)    Muscle aches  . Metformin And Related     Intestinal side effects    Current Outpatient Prescriptions  Medication Sig Dispense Refill  . ALPRAZolam (XANAX) 0.5 MG tablet TAKE 1/2 TO 1 TABLET BY MOUTH AS NEEDED FOR SLEEP.  30 tablet  1  . aspirin EC 81 MG tablet Take 81 mg by mouth at bedtime.       . bisacodyl (DULCOLAX) 5 MG EC tablet Take 2 tablets (10 mg total) by mouth daily as needed for constipation.  30 tablet  0  . budesonide-formoterol (SYMBICORT) 160-4.5 MCG/ACT inhaler Inhale 2 puffs into the lungs 4 (four) times daily.       . clopidogrel (PLAVIX) 75 MG tablet TAKE ONE TABLET BY MOUTH EVERY DAY  30 tablet  6  . fenofibrate 160 MG tablet Take 160 mg by mouth at bedtime.      . Insulin Glargine (LANTUS SOLOSTAR) 100 UNIT/ML Solostar Pen Inject 60-80 Units into the skin at bedtime.       . metoprolol tartrate (LOPRESSOR) 25 MG tablet Take 1 tablet (25 mg total) by mouth 2 (two) times daily.  60 tablet  3  . nitroGLYCERIN (NITROSTAT) 0.4 MG SL tablet Place 1 tablet (0.4 mg total) under the tongue every 5 (five) minutes x 3 doses as needed for chest pain.  25 tablet  3  . torsemide (DEMADEX) 20 MG tablet Take 20 mg by mouth 2 (two) times daily.      . traMADol (ULTRAM) 50 MG tablet TAKE 1 TABLET BY MOUTH EVERY 6 HOURS AS NEEDED FOR PAIN.  24 tablet  4  . valsartan (DIOVAN) 80 MG tablet Take 80 mg by mouth daily.        No current facility-administered medications for this visit.    Past Medical History  Diagnosis Date  . Arteriosclerotic cardiovascular disease (ASCVD)     a. 05/2011 Cath/PCI: LM nl, LAD 24m, D1 small, D2 small 3m, LCX large 40p, RCA 50-60p, 99 hazy @ origin of PDA with 70-80 in PDA (2.5x26 Resolute Integrity & 3.0x15  Resolute Integrity DES).;  b. 08/2012 Inflat  STEMI/Cath/PCI: LM minor irregs, LAD 50p, D1 50, LCX nl, OM1 25, RCA 30-40p, 100d (treated with 2.75x32mm Promus Premier DES);  c. 08/2012 Echo: EF 55-60%, basal inferopost HK.  [name] Hyperlipidemia   . Diabetes mellitus     Peripheral neuropathy  . Bell palsy   . Hypertension   . COPD (chronic obstructive pulmonary disease)   . Sleep apnea   . Gallstones   . Cholelithiasis 07/2012    Asymptomatic; identified incidentally  . PONV (postoperative nausea and vomiting)   . Anxiety   . C. difficile colitis     a. 08/2012  . Myocardial infarct 09/08/12  . Nephrolithiasis   . Contrast dye induced nephropathy     a. 08/2012 post cath/pci  . CHF (congestive heart failure)   . Asthma   . Heart disease   . Old myocardial infarction     Past  Surgical History  Procedure Laterality Date  . Circumcision    . Stents    . Esophagogastroduodenoscopy      in [place] over 20 yrs ago  . Colonoscopy      In [facility], approximately 2011 per patient, was normal. Advised to come back in 10 years.  . Esophagogastroduodenoscopy  06/12/2012    Procedure: ESOPHAGOGASTRODUODENOSCOPY (EGD);  Surgeon: Daneil Dolin, MD;  [facility];  Service: Endoscopy;  Laterality: N/A;  9:45  . Cardiac catheterization    . Subxyphoid pericardial window N/A 06/23/2013    Procedure: SUBXYPHOID PERICARDIAL WINDOW;  Surgeon: Grace Isaac, MD;  Location: [place];  Service: Thoracic;  Laterality: N/A;  . Intraoperative transesophageal echocardiogram N/A 06/23/2013    Procedure: INTRAOPERATIVE TRANSESOPHAGEAL ECHOCARDIOGRAM;  Surgeon: Grace Isaac, MD;  Location: [employer];  Service: Open Heart Surgery;  Laterality: N/A;    History   Social History  . Marital Status: Married    Spouse Name: N/A    Number of Children: 2  . Years of Education: N/A   Occupational History  . Shipping     [employer]   Social History Main Topics  . Smoking status: Never Smoker   . Smokeless tobacco: Never Used  . Alcohol Use: No     Comment: heavy etoh use 30 years ago  . Drug Use: No  . Sexual Activity: Yes    Birth Control/ Protection: None   Other Topics Concern  . 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 Disabled at this point.     Filed Vitals:   11/11/13 1323  BP: 161/98  Pulse: 89  Height: 6\' 1"  (1.854 m)  Weight: 261 lb (118.389 kg)  SpO2: 96%    PHYSICAL EXAM General: NAD Neck: No JVD, no thyromegaly. Lungs: Clear to auscultation bilaterally with normal respiratory effort. CV: Nondisplaced PMI.  Regular rate and rhythm, normal S1/S2, no S3/S4, no murmur. Trace pretibial/periankle edema.  No carotid bruit.  Normal pedal pulses.  Abdomen: Soft, nontender, no  hepatosplenomegaly, no distention.  Neurologic: Alert and oriented x 3.  Psych: Normal affect. Extremities: No clubbing or cyanosis.   ECG: reviewed and available in electronic records.      ASSESSMENT AND PLAN: 1. CAD s/p PCI: He appears to be symptomatically stable. Continue aspirin, metoprolol, and Plavix.  2. HTN: uncontrolled today. As he was unable to tolerate increased doses of valsartan, I will start hydralazine 25 mg bid (he does not  think he'll take it tid) along with valsartan 80 mg daily. 3. Chronic diastolic heart failure: euvolemic.Continue present diuretic regimen of torsemide 20 mg bid. 4. Hyperlipidemia: intolerant to statins (muscle aches). Continue flaxseed oil, fenofibrate, and therapeutic lifestyle changes.  5. Pericardial effusion s/p pericardial window: will repeat a limited echo to assess. 6. COPD: stable on Symbicort.  Dispo: f/u 3-4 months.       Kate Sable, M.D., F.A.C.C.

## 2013-11-19 NOTE — Progress Notes (Signed)
He will need to do CBC in 3 months he is aware of this. No need to do Hemoccult cards.

## 2013-11-24 ENCOUNTER — Telehealth: Payer: Self-pay | Admitting: *Deleted

## 2013-11-24 ENCOUNTER — Other Ambulatory Visit: Payer: Self-pay | Admitting: *Deleted

## 2013-11-24 MED ORDER — HYDRALAZINE HCL 25 MG PO TABS: 12.5000 mg | ORAL_TABLET | Freq: Two times a day (BID) | ORAL | Status: DC

## 2013-11-24 NOTE — Telephone Encounter (Signed)
[facility] faxed progress note (see scanned into EPIC).  Stated that patient was reporting feeling tired & sleepy all the time since starting Hydralazine.    Reviewed & discussed with Dr. Bronson Ing - decrease to 1/2 tab twice a day.  Patient notified & advised to call with update in approximately 7-10 days (may also let [facility] know).  Patient verbalized understanding.    Will also fax return note to [facility].

## 2013-11-25 ENCOUNTER — Other Ambulatory Visit (INDEPENDENT_AMBULATORY_CARE_PROVIDER_SITE_OTHER): Payer: Medicare HMO

## 2013-11-25 ENCOUNTER — Other Ambulatory Visit: Payer: Self-pay

## 2013-11-25 DIAGNOSIS — I5032 Chronic diastolic (congestive) heart failure: Secondary | ICD-10-CM

## 2013-11-25 DIAGNOSIS — I3139 Other pericardial effusion (noninflammatory): Secondary | ICD-10-CM

## 2013-11-25 DIAGNOSIS — I319 Disease of pericardium, unspecified: Secondary | ICD-10-CM

## 2013-11-25 DIAGNOSIS — I2510 Atherosclerotic heart disease of native coronary artery without angina pectoris: Secondary | ICD-10-CM

## 2013-11-25 DIAGNOSIS — I313 Pericardial effusion (noninflammatory): Secondary | ICD-10-CM

## 2013-11-25 DIAGNOSIS — R609 Edema, unspecified: Secondary | ICD-10-CM

## 2013-11-26 ENCOUNTER — Telehealth: Payer: Self-pay | Admitting: Family Medicine

## 2013-11-26 ENCOUNTER — Telehealth: Payer: Self-pay | Admitting: *Deleted

## 2013-11-26 NOTE — Telephone Encounter (Signed)
Pt wants to know if he can get a script for   Easy touch diabetes strips sent to [facility] in [place]  We have never filled this for him before it will be a new script   Last seen 11/06/13

## 2013-11-26 NOTE — Telephone Encounter (Signed)
Message copied by Laurine Blazer on Thu Nov 26, 2013 10:39 AM ------      Message from: Kate Sable A      Created: Wed Nov 25, 2013  3:15 PM       Stable. Reassess pericardial effusion with limited echo in 3 months. ------

## 2013-11-26 NOTE — Telephone Encounter (Signed)
Script faxed to [facility]am. Pt notified.

## 2013-11-26 NOTE — Telephone Encounter (Signed)
Notes Recorded by Laurine Blazer, LPN on 579FGE at 579FGE AM Patient notified and verbalized understanding.

## 2013-12-08 ENCOUNTER — Encounter: Payer: Self-pay | Admitting: Family Medicine

## 2013-12-08 ENCOUNTER — Ambulatory Visit (INDEPENDENT_AMBULATORY_CARE_PROVIDER_SITE_OTHER): Payer: Medicare PPO | Admitting: Family Medicine

## 2013-12-08 VITALS — BP 142/88 | Temp 98.5°F | Ht 73.0 in | Wt 266.0 lb

## 2013-12-08 DIAGNOSIS — H6092 Unspecified otitis externa, left ear: Secondary | ICD-10-CM

## 2013-12-08 DIAGNOSIS — H60399 Other infective otitis externa, unspecified ear: Secondary | ICD-10-CM

## 2013-12-08 MED ORDER — OFLOXACIN 0.3 % OT SOLN: 5.0000 [drp] | Freq: Two times a day (BID) | OTIC | Status: DC

## 2013-12-08 NOTE — Progress Notes (Signed)
   Subjective:    Patient ID: Eugene [name], male    DOB: [birth year], 57 y.o.   MRN: [id number]  Otalgia  There is pain in the left ear. The current episode started in the past 7 days. Associated symptoms include ear discharge. He has tried nothing for the symptoms.   Discomfort and tenderness  Some discharge Patient notes no cough or cold. Minimal headache.  Does not wear ear plugs.  Does have diabetes control overall has been decent of late.  Review of Systems  HENT: Positive for ear discharge and ear pain.    No throat pain ROS otherwise    Objective:   Physical Exam  Alert no acute distress vital stable. Lungs clear. Heart rare rhythm. HEENT pharynx normal neck supple no lymphadenopathy TMs normal left and now somewhat swollen and irritated.      Assessment & Plan:  Impression 1 external otitis plan Floxin 5 drops twice a day 7 days. Symptomatic care discussed. Warning signs discussed. WSL

## 2013-12-13 ENCOUNTER — Encounter (HOSPITAL_COMMUNITY): Payer: Self-pay | Admitting: Emergency Medicine

## 2013-12-13 ENCOUNTER — Observation Stay (HOSPITAL_COMMUNITY)
Admission: EM | Admit: 2013-12-13 | Discharge: 2013-12-14 | DRG: 303 | Disposition: A | Discharge status: Home / Self Care | Payer: Medicare HMO | Attending: Cardiovascular Disease | Admitting: Cardiovascular Disease

## 2013-12-13 ENCOUNTER — Emergency Department (HOSPITAL_COMMUNITY): Payer: Medicare HMO

## 2013-12-13 DIAGNOSIS — G609 Hereditary and idiopathic neuropathy, unspecified: Secondary | ICD-10-CM

## 2013-12-13 DIAGNOSIS — Z955 Presence of coronary angioplasty implant and graft: Secondary | ICD-10-CM

## 2013-12-13 DIAGNOSIS — I5031 Acute diastolic (congestive) heart failure: Secondary | ICD-10-CM

## 2013-12-13 DIAGNOSIS — N179 Acute kidney failure, unspecified: Secondary | ICD-10-CM | POA: Diagnosis present

## 2013-12-13 DIAGNOSIS — R609 Edema, unspecified: Secondary | ICD-10-CM

## 2013-12-13 DIAGNOSIS — N289 Disorder of kidney and ureter, unspecified: Secondary | ICD-10-CM

## 2013-12-13 DIAGNOSIS — I3139 Other pericardial effusion (noninflammatory): Secondary | ICD-10-CM

## 2013-12-13 DIAGNOSIS — I5032 Chronic diastolic (congestive) heart failure: Secondary | ICD-10-CM

## 2013-12-13 DIAGNOSIS — R079 Chest pain, unspecified: Secondary | ICD-10-CM

## 2013-12-13 DIAGNOSIS — N184 Chronic kidney disease, stage 4 (severe): Secondary | ICD-10-CM

## 2013-12-13 DIAGNOSIS — G629 Polyneuropathy, unspecified: Secondary | ICD-10-CM

## 2013-12-13 DIAGNOSIS — D649 Anemia, unspecified: Secondary | ICD-10-CM

## 2013-12-13 DIAGNOSIS — I129 Hypertensive chronic kidney disease with stage 1 through stage 4 chronic kidney disease, or unspecified chronic kidney disease: Secondary | ICD-10-CM | POA: Diagnosis present

## 2013-12-13 DIAGNOSIS — E1149 Type 2 diabetes mellitus with other diabetic neurological complication: Secondary | ICD-10-CM | POA: Diagnosis present

## 2013-12-13 DIAGNOSIS — R197 Diarrhea, unspecified: Secondary | ICD-10-CM

## 2013-12-13 DIAGNOSIS — I10 Essential (primary) hypertension: Secondary | ICD-10-CM

## 2013-12-13 DIAGNOSIS — K8020 Calculus of gallbladder without cholecystitis without obstruction: Secondary | ICD-10-CM

## 2013-12-13 DIAGNOSIS — R5381 Other malaise: Secondary | ICD-10-CM

## 2013-12-13 DIAGNOSIS — I319 Disease of pericardium, unspecified: Secondary | ICD-10-CM

## 2013-12-13 DIAGNOSIS — E119 Type 2 diabetes mellitus without complications: Secondary | ICD-10-CM

## 2013-12-13 DIAGNOSIS — J449 Chronic obstructive pulmonary disease, unspecified: Secondary | ICD-10-CM | POA: Diagnosis present

## 2013-12-13 DIAGNOSIS — I509 Heart failure, unspecified: Secondary | ICD-10-CM | POA: Diagnosis present

## 2013-12-13 DIAGNOSIS — J4489 Other specified chronic obstructive pulmonary disease: Secondary | ICD-10-CM | POA: Diagnosis present

## 2013-12-13 DIAGNOSIS — R531 Weakness: Secondary | ICD-10-CM

## 2013-12-13 DIAGNOSIS — E1142 Type 2 diabetes mellitus with diabetic polyneuropathy: Secondary | ICD-10-CM | POA: Diagnosis present

## 2013-12-13 DIAGNOSIS — Z9861 Coronary angioplasty status: Secondary | ICD-10-CM

## 2013-12-13 DIAGNOSIS — I2510 Atherosclerotic heart disease of native coronary artery without angina pectoris: Principal | ICD-10-CM

## 2013-12-13 DIAGNOSIS — R5383 Other fatigue: Secondary | ICD-10-CM

## 2013-12-13 DIAGNOSIS — E785 Hyperlipidemia, unspecified: Secondary | ICD-10-CM

## 2013-12-13 DIAGNOSIS — I252 Old myocardial infarction: Secondary | ICD-10-CM

## 2013-12-13 DIAGNOSIS — R0602 Shortness of breath: Secondary | ICD-10-CM

## 2013-12-13 DIAGNOSIS — I313 Pericardial effusion (noninflammatory): Secondary | ICD-10-CM

## 2013-12-13 DIAGNOSIS — G4733 Obstructive sleep apnea (adult) (pediatric): Secondary | ICD-10-CM

## 2013-12-13 DIAGNOSIS — N189 Chronic kidney disease, unspecified: Secondary | ICD-10-CM | POA: Diagnosis present

## 2013-12-13 LAB — COMPREHENSIVE METABOLIC PANEL
ALT: 24 U/L (ref 0–53)
AST: 26 U/L (ref 0–37)
Albumin: 3.3 g/dL — ABNORMAL LOW (ref 3.5–5.2)
Alkaline Phosphatase: 63 U/L (ref 39–117)
BUN: 49 mg/dL — AB (ref 6–23)
CALCIUM: 9.8 mg/dL (ref 8.4–10.5)
CO2: 23 mEq/L (ref 19–32)
Chloride: 99 mEq/L (ref 96–112)
Creatinine, Ser: 2.22 mg/dL — ABNORMAL HIGH (ref 0.50–1.35)
GFR calc non Af Amer: 31 mL/min — ABNORMAL LOW (ref >90)
GFR, EST AFRICAN AMERICAN: 36 mL/min — AB (ref >90)
GLUCOSE: 326 mg/dL — AB (ref 70–99)
POTASSIUM: 4.9 meq/L (ref 3.7–5.3)
Sodium: 136 mEq/L — ABNORMAL LOW (ref 137–147)
Total Bilirubin: 0.2 mg/dL — ABNORMAL LOW (ref 0.3–1.2)
Total Protein: 7 g/dL (ref 6.0–8.3)

## 2013-12-13 LAB — URINALYSIS, ROUTINE W REFLEX MICROSCOPIC
BILIRUBIN URINE: NEGATIVE
Glucose, UA: >1000 mg/dL — AB
KETONES UR: NEGATIVE mg/dL
Leukocytes, UA: NEGATIVE
NITRITE: NEGATIVE
PROTEIN: 100 mg/dL — AB
SPECIFIC GRAVITY, URINE: 1.025 (ref 1.005–1.030)
UROBILINOGEN UA: 0.2 mg/dL (ref 0.0–1.0)
pH: 5.5 (ref 5.0–8.0)

## 2013-12-13 LAB — CBC WITH DIFFERENTIAL/PLATELET
Basophils Absolute: 0 10*3/uL (ref 0.0–0.1)
Basophils Relative: 0 % (ref 0–1)
EOS PCT: 1 % (ref 0–5)
Eosinophils Absolute: 0.1 10*3/uL (ref 0.0–0.7)
HEMATOCRIT: 31.3 % — AB (ref 39.0–52.0)
HEMOGLOBIN: 10.9 g/dL — AB (ref 13.0–17.0)
LYMPHS ABS: 2.1 10*3/uL (ref 0.7–4.0)
Lymphocytes Relative: 30 % (ref 12–46)
MCH: 31.1 pg (ref 26.0–34.0)
MCHC: 34.8 g/dL (ref 30.0–36.0)
MCV: 89.2 fL (ref 78.0–100.0)
MONOS PCT: 7 % (ref 3–12)
Monocytes Absolute: 0.5 10*3/uL (ref 0.1–1.0)
Neutro Abs: 4.3 10*3/uL (ref 1.7–7.7)
Neutrophils Relative %: 62 % (ref 43–77)
Platelets: 186 10*3/uL (ref 150–400)
RBC: 3.51 MIL/uL — AB (ref 4.22–5.81)
RDW: 12.9 % (ref 11.5–15.5)
WBC: 6.9 10*3/uL (ref 4.0–10.5)

## 2013-12-13 LAB — URINE MICROSCOPIC-ADD ON

## 2013-12-13 LAB — GLUCOSE, CAPILLARY
GLUCOSE-CAPILLARY: 300 mg/dL — AB (ref 70–99)
GLUCOSE-CAPILLARY: 275 mg/dL — AB (ref 70–99)
Glucose-Capillary: 183 mg/dL — ABNORMAL HIGH (ref 70–99)
Glucose-Capillary: 278 mg/dL — ABNORMAL HIGH (ref 70–99)

## 2013-12-13 LAB — TROPONIN I: Troponin I: <0.3 ng/mL (ref <0.30)

## 2013-12-13 LAB — CBG MONITORING, ED: GLUCOSE-CAPILLARY: 164 mg/dL — AB (ref 70–99)

## 2013-12-13 MED ORDER — FUROSEMIDE 10 MG/ML IJ SOLN
40.0000 mg | Freq: Once | INTRAMUSCULAR | Status: AC
  Administered 2013-12-13: 40 mg via INTRAVENOUS
  Filled 2013-12-13: qty 4

## 2013-12-13 MED ORDER — HYDRALAZINE HCL 25 MG PO TABS: 25.0000 mg | ORAL_TABLET | Freq: Two times a day (BID) | ORAL | Status: DC

## 2013-12-13 MED ORDER — CLOPIDOGREL BISULFATE 75 MG PO TABS
75.0000 mg | ORAL_TABLET | Freq: Every day | ORAL | Status: DC
  Administered 2013-12-13 – 2013-12-14 (×2): 75 mg via ORAL
  Filled 2013-12-13 (×2): qty 1

## 2013-12-13 MED ORDER — INSULIN ASPART 100 UNIT/ML ~~LOC~~ SOLN
0.0000 [IU] | Freq: Every day | SUBCUTANEOUS | Status: DC
  Administered 2013-12-13: 3 [IU] via SUBCUTANEOUS

## 2013-12-13 MED ORDER — ASPIRIN 81 MG PO CHEW
81.0000 mg | CHEWABLE_TABLET | Freq: Once | ORAL | Status: AC
  Administered 2013-12-13: 81 mg via ORAL
  Filled 2013-12-13: qty 1

## 2013-12-13 MED ORDER — TORSEMIDE 20 MG PO TABS
20.0000 mg | ORAL_TABLET | Freq: Every day | ORAL | Status: DC
  Administered 2013-12-14: 20 mg via ORAL
  Filled 2013-12-13: qty 1

## 2013-12-13 MED ORDER — INSULIN ASPART 100 UNIT/ML IV SOLN
10.0000 [IU] | Freq: Once | INTRAVENOUS | Status: AC
  Administered 2013-12-13: 10 [IU] via INTRAVENOUS

## 2013-12-13 MED ORDER — INSULIN GLARGINE 100 UNIT/ML ~~LOC~~ SOLN
60.0000 [IU] | Freq: Once | SUBCUTANEOUS | Status: AC
  Administered 2013-12-13: 60 [IU] via SUBCUTANEOUS
  Filled 2013-12-13 (×2): qty 0.6

## 2013-12-13 MED ORDER — INSULIN ASPART 100 UNIT/ML ~~LOC~~ SOLN
0.0000 [IU] | Freq: Three times a day (TID) | SUBCUTANEOUS | Status: DC
  Administered 2013-12-13 (×2): 8 [IU] via SUBCUTANEOUS
  Administered 2013-12-14: 15 [IU] via SUBCUTANEOUS

## 2013-12-13 MED ORDER — IRBESARTAN 75 MG PO TABS
75.0000 mg | ORAL_TABLET | Freq: Every day | ORAL | Status: DC
  Administered 2013-12-13 – 2013-12-14 (×2): 75 mg via ORAL
  Filled 2013-12-13 (×2): qty 1

## 2013-12-13 MED ORDER — REGADENOSON 0.4 MG/5ML IV SOLN
0.4000 mg | Freq: Once | INTRAVENOUS | Status: AC
  Administered 2013-12-14: 0.4 mg via INTRAVENOUS
  Filled 2013-12-13: qty 5

## 2013-12-13 MED ORDER — HYDRALAZINE HCL 25 MG PO TABS
12.5000 mg | ORAL_TABLET | Freq: Two times a day (BID) | ORAL | Status: DC
  Administered 2013-12-13 – 2013-12-14 (×3): 12.5 mg via ORAL
  Filled 2013-12-13 (×4): qty 0.5

## 2013-12-13 MED ORDER — METOPROLOL TARTRATE 25 MG PO TABS
25.0000 mg | ORAL_TABLET | Freq: Two times a day (BID) | ORAL | Status: DC
  Administered 2013-12-13 – 2013-12-14 (×3): 25 mg via ORAL
  Filled 2013-12-13 (×4): qty 1

## 2013-12-13 NOTE — ED Notes (Signed)
Patient given 10 units of Insulin IV per order.  Patient continues to c/o generalized weakness.  Patient states he take 80 units of Lantus at bedtime, but had not taken his medications yet.  Patient remains A&O; skin w/d. Respirations even and unlabored; able to speak in complete sentences without difficulty.

## 2013-12-13 NOTE — ED Notes (Signed)
Pt reports to ems that he was outside working all day and feels he got dehydrated, states he just doesn't fell well, denies pain.

## 2013-12-13 NOTE — ED Notes (Signed)
Attempted to call report, nurse unavailable.

## 2013-12-13 NOTE — ED Provider Notes (Signed)
CSN: [id number]     Arrival date & time 12/13/13  0052 History   First MD Initiated Contact with Patient 12/13/13 0102     Chief Complaint  Patient presents with  . Weakness     (Consider location/radiation/quality/duration/timing/severity/associated sxs/prior Treatment) HPI Patient presents with generalized fatigue for several days. He states he was outside working and began having mild left-sided chest pain at around 4 PM. It resolved spontaneously. He states his also had episodic diaphoresis which is now resolved. Patient currently has no pain including no chest pain, no shortness of breath no abdominal pain. He has no focal weakness. He was given 324 mg of aspirin by EMS en route. He has history of coronary artery disease and last had a stent placed in January of 2013. Past Medical History  Diagnosis Date  . Arteriosclerotic cardiovascular disease (ASCVD)     a. 05/2011 Cath/PCI: LM nl, LAD 59m, D1 small, D2 small 25m, LCX large 40p, RCA 50-60p, 99 hazy @ origin of PDA with 70-80 in PDA (2.5x26 Resolute Integrity & 3.0x15 Resolute Integrity DES).;  b. 08/2012 Inflat  STEMI/Cath/PCI: LM minor irregs, LAD 50p, D1 50, LCX nl, OM1 25, RCA 30-40p, 100d (treated with 2.75x8mm Promus Premier DES);  c. 08/2012 Echo: EF 55-60%, basal inferopost HK.  [name] Hyperlipidemia   . Diabetes mellitus     Peripheral neuropathy  . Bell palsy   . Hypertension   . COPD (chronic obstructive pulmonary disease)   . Sleep apnea   . Gallstones   . Cholelithiasis 07/2012    Asymptomatic; identified incidentally  . PONV (postoperative nausea and vomiting)   . Anxiety   . C. difficile colitis     a. 08/2012  . Myocardial infarct 09/08/12  . Nephrolithiasis   . Contrast dye induced nephropathy     a. 08/2012 post cath/pci  . CHF (congestive heart failure)   . Asthma   . Heart disease   . Old myocardial infarction    Past Surgical History  Procedure Laterality Date  . Circumcision    . Stents    .  Esophagogastroduodenoscopy      in [place] over 20 yrs ago  . Colonoscopy      In [facility], approximately 2011 per patient, was normal. Advised to come back in 10 years.  . Esophagogastroduodenoscopy  06/12/2012    Procedure: ESOPHAGOGASTRODUODENOSCOPY (EGD);  Surgeon: Daneil Dolin, MD;  [facility];  Service: Endoscopy;  Laterality: N/A;  9:45  . Cardiac catheterization    . Subxyphoid pericardial window N/A 06/23/2013    Procedure: SUBXYPHOID PERICARDIAL WINDOW;  Surgeon: Grace Isaac, MD;  Location: [place];  Service: Thoracic;  Laterality: N/A;  . Intraoperative transesophageal echocardiogram N/A 06/23/2013    Procedure: INTRAOPERATIVE TRANSESOPHAGEAL ECHOCARDIOGRAM;  Surgeon: Grace Isaac, MD;  Location: [place];  Service: Open Heart Surgery;  Laterality: N/A;   Family History  Problem Relation Age of Onset  . Diabetes Mother   . Heart attack Mother   . Stroke Mother   . Diabetes Sister   . Sleep apnea Sister   . Hypertension Brother   . Diabetes Brother   . Colon cancer Neg Hx   . Liver disease Neg Hx   . Diabetes Brother   . Hypertension Brother    History  Substance Use Topics  . Smoking status: Never Smoker   . Smokeless tobacco: Never Used  . Alcohol Use: No     Comment: heavy etoh use 30  years ago    Review of Systems  Constitutional: Positive for diaphoresis and fatigue. Negative for fever and chills.  HENT: Negative for congestion.   Respiratory: Negative for cough and shortness of breath.   Cardiovascular: Positive for chest pain. Negative for palpitations and leg swelling.  Gastrointestinal: Negative for nausea, vomiting, abdominal pain and diarrhea.  Genitourinary: Negative for dysuria and frequency.  Musculoskeletal: Negative for back pain, myalgias, neck pain and neck stiffness.  Skin: Negative for rash and wound.  Neurological: Positive for weakness (generalized). Negative for dizziness, syncope, light-headedness,  numbness and headaches.      Allergies  Hydrocodone; Lisinopril; Neurontin; Statins; and Metformin and related  Home Medications   Prior to Admission medications   Medication Sig Start Date End Date Taking? Authorizing Provider  Alcohol Swabs (ALCOHOL PREP) 70 % PADS  11/07/13   Historical Provider, MD  ALPRAZolam (XANAX) 0.5 MG tablet TAKE 1/2 TO 1 TABLET BY MOUTH AS NEEDED FOR SLEEP.    [name], MD  aspirin EC 81 MG tablet Take 81 mg by mouth at bedtime.     Historical Provider, MD  ASSURE COMFORT LANCETS 30G [place]  11/07/13   Historical Provider, MD  BD PEN NEEDLE NANO U/F 32G X 4 MM MISC  11/07/13   Historical Provider, MD  bisacodyl (DULCOLAX) 5 MG EC tablet Take 2 tablets (10 mg total) by mouth daily as needed for constipation. 06/26/13   Roger A Arguello, PA-C  Blood Glucose Calibration (EMBRACE CONTROL) LOW SOLN  11/07/13   Historical Provider, MD  Blood Glucose Monitoring Suppl (EMBRACE BLOOD GLUCOSE MONITOR) DEVI  11/07/13   Historical Provider, MD  budesonide-formoterol (SYMBICORT) 160-4.5 MCG/ACT inhaler Inhale 2 puffs into the lungs 4 (four) times daily.    Historical Provider, MD  clopidogrel (PLAVIX) 75 MG tablet TAKE ONE TABLET BY MOUTH EVERY DAY    Herminio Commons, MD  doxazosin (CARDURA) 4 MG tablet  10/12/13   Historical Provider, MD  [facility] BLOOD GLUCOSE TEST test strip  11/07/13   Historical Provider, MD  fenofibrate 160 MG tablet Take 160 mg by mouth at bedtime.    Historical Provider, MD  hydrALAZINE (APRESOLINE) 25 MG tablet Take 0.5 tablets (12.5 mg total) by mouth 2 (two) times daily. 11/24/13   Herminio Commons, MD  Insulin Glargine (LANTUS SOLOSTAR) 100 UNIT/ML Solostar Pen Inject 60-80 Units into the skin at bedtime.     Historical Provider, MD  Lancet Devices (ADJUSTABLE LANCING DEVICE) MISC  11/07/13   Historical Provider, MD  metoprolol tartrate (LOPRESSOR) 25 MG tablet Take 1 tablet (25 mg total) by mouth 2 (two) times daily. 06/26/13   Roger A  Arguello, PA-C  nitroGLYCERIN (NITROSTAT) 0.4 MG SL tablet Place 1 tablet (0.4 mg total) under the tongue every 5 (five) minutes x 3 doses as needed for chest pain. 09/16/12   Rogelia Mire, NP  ofloxacin (FLOXIN) 0.3 % otic solution Place 5 drops into the left ear 2 (two) times daily. 12/08/13   Mikey Kirschner, MD  torsemide (DEMADEX) 20 MG tablet Take 20 mg by mouth 2 (two) times daily.    Historical Provider, MD  traMADol (ULTRAM) 50 MG tablet TAKE 1 TABLET BY MOUTH EVERY 6 HOURS AS NEEDED FOR PAIN.    [name], MD  valsartan (DIOVAN) 80 MG tablet Take 80 mg by mouth daily.  10/09/13   Historical Provider, MD   BP 200/93  Pulse 90  Temp(Src) 98.3 F (36.8 C) (Oral)  Resp 18  Ht 6\' 1"  (1.854 m)  Wt 258 lb (117.028 kg)  BMI 34.05 kg/m2  SpO2 97% Physical Exam  Nursing note and vitals reviewed. Constitutional: He is oriented to person, place, and time. He appears well-developed and well-nourished. No distress.  HENT:  Head: Normocephalic and atraumatic.  Mouth/Throat: Oropharynx is clear and moist.  Eyes: EOM are normal. Pupils are equal, round, and reactive to light.  Neck: Normal range of motion. Neck supple.  Cardiovascular: Normal rate and regular rhythm.   Pulmonary/Chest: Effort normal and breath sounds normal. No respiratory distress. He has no wheezes. He has no rales. He exhibits no tenderness.  Abdominal: Soft. Bowel sounds are normal. He exhibits no distension and no mass. There is no tenderness. There is no rebound and no guarding.  Musculoskeletal: Normal range of motion. He exhibits edema (2+ bilateral lower extremity swelling). He exhibits no tenderness.  Neurological: He is alert and oriented to person, place, and time.  Moves all extremities without deficit. Sensation is grossly intact.  Skin: Skin is warm and dry. No rash noted. No erythema.  Psychiatric: He has a normal mood and affect. His behavior is normal.    ED Course  Procedures (including  critical care time) Labs Review Labs Reviewed  CBC WITH DIFFERENTIAL - Abnormal; Notable for the following:    RBC 3.51 (*)    Hemoglobin 10.9 (*)    HCT 31.3 (*)    All other components within normal limits  COMPREHENSIVE METABOLIC PANEL  TROPONIN I  URINALYSIS, ROUTINE W REFLEX MICROSCOPIC    Imaging Review Dg Chest 2 View  12/13/2013   CLINICAL DATA:  Shortness of breath, chest pain.  EXAM: CHEST  2 VIEW  COMPARISON:  10/15/2013.  FINDINGS: Heart is borderline in size. Minimal bibasilar atelectasis. No effusions or edema. No acute bony abnormality.  IMPRESSION: Minimal bibasilar atelectasis.   Electronically Signed   By: Rolm Baptise M.D.   On: 12/13/2013 01:41     EKG Interpretation   Date/Time:  Sunday December 13 2013 01:17:34 EDT Ventricular Rate:  86 PR Interval:  188 QRS Duration: 104 QT Interval:  352 QTC Calculation: 421 R Axis:   40 Text Interpretation:  Normal sinus rhythm Low voltage QRS Incomplete right  bundle branch block Possible Inferior infarct (cited on or before  12-Sep-2013) Cannot rule out Anteroseptal infarct , age undetermined  Abnormal ECG When compared with ECG of 15-Oct-2013 19:46, Minimal criteria  for Anteroseptal infarct are now Present Confirmed by Lita Mains  MD, Mark Benecke  ([id number]) on 12/13/2013 2:25:22 AM      MDM   Final diagnoses:  None    Patient remained asymptomatic in the emergency department. Is resting comfortably. He has an EKG without acute changes. Troponin x2 are normal. Discussed with Dr. Andy Gauss on call for cardiology. Will accept the patient in transfer to a telemetry bed and likely proceed with stress testing.    Julianne Rice, MD 12/13/13 [phone]

## 2013-12-13 NOTE — H&P (Signed)
CARDIOLOGY CONSULT NOTE  Patient ID: Eugene [name] MRN: [id number] DOB/AGE: Oct 20, 1955 58 y.o.  Admit date: 12/13/2013 Primary Physician [name], MD  Reason for Admission: weakness, nausea, chest pain, CAD  HPI: Eugene [name] is a 58 yr old male with CAD with prior RCA stent in 2014, pericardial effusion with window, DM, HTN, hyperlipidemia, COPD, OSA, and diastolic heart failure. He has a history of STEMI in Jan 2014. Cath showed LM patent, LAD 50% proximal, 50% D1, LCX patent, RCA 30-40% in previously placed stent and 100% distal occlusion. RCA received a DES. I saw him in the clinic last month.  He was hospitalized in February for increasing dyspnea on exertion. My colleague Dr. [name] saw him in consultation and did not feel it was cardiac in etiology, specifically not feeling that he had signs of volume overload or ischemia. Echocardiogram at that time showed a small to moderate circumferential pericardial effusion with no evidence of tamponade physiology, EF 60-65%.  He then saw his pulmonologist and it was deemed his shortness of breath was due to COPD, and was started on Symbicort twice daily and had been feeling much better. He had been tried on Imdur in the past which led to dizziness and lightheadedness.  A repeat echo on 4/1 showed normal LV systolic function, EF 123456, severe LVH, and a small to moderate size pericardial effusion without evidence of tamponade physiology.  For the past 4 days, he has been having nausea but without vomiting. He has felt tired and weak with more dyspnea, and says his symptoms are similar to what he experienced prior to his MI in 2014. He had diarrhea last night. Yesterday, he had been working outdoors and suddenly felt diaphoretic and clammy. This morning while in the hospital, he experienced chest pain relieved with one SL nitro. ECG showed sinus rhythm, incomplete RBBB, TWI in I and aVL, and old inferior infarct. When compared  to ECG on 2/19, TWI in I are new and findings in aVL are more pronounced. Troponins x 2 normal.  Urinalysis unremarkable.    Allergies  Allergen Reactions  . Hydrocodone Nausea And Vomiting  . Lisinopril Cough  . Neurontin [Gabapentin]     Side effects, suicidal thoughts  . Statins Other (See Comments)    Muscle aches  . Metformin And Related     Intestinal side effects    No current facility-administered medications for this encounter.    Past Medical History  Diagnosis Date  . Arteriosclerotic cardiovascular disease (ASCVD)     a. 05/2011 Cath/PCI: LM nl, LAD 22m, D1 small, D2 small 71m, LCX large 40p, RCA 50-60p, 99 hazy @ origin of PDA with 70-80 in PDA (2.5x26 Resolute Integrity & 3.0x15 Resolute Integrity DES).;  b. 08/2012 Inflat  STEMI/Cath/PCI: LM minor irregs, LAD 50p, D1 50, LCX nl, OM1 25, RCA 30-40p, 100d (treated with 2.75x65mm Promus Premier DES);  c. 08/2012 Echo: EF 55-60%, basal inferopost HK.  [name] Hyperlipidemia   . Diabetes mellitus     Peripheral neuropathy  . Bell palsy   . Hypertension   . COPD (chronic obstructive pulmonary disease)   . Sleep apnea   . Gallstones   . Cholelithiasis 07/2012    Asymptomatic; identified incidentally  . PONV (postoperative nausea and vomiting)   . Anxiety   . C. difficile colitis     a. 08/2012  . Myocardial infarct 09/08/12  . Nephrolithiasis   . Contrast dye induced nephropathy     a.  08/2012 post cath/pci  . CHF (congestive heart failure)   . Asthma   . Heart disease   . Old myocardial infarction     Past Surgical History  Procedure Laterality Date  . Circumcision    . Stents    . Esophagogastroduodenoscopy      in [place] over 20 yrs ago  . Colonoscopy      In [facility], approximately 2011 per patient, was normal. Advised to come back in 10 years.  . Esophagogastroduodenoscopy  06/12/2012    Procedure: ESOPHAGOGASTRODUODENOSCOPY (EGD);  Surgeon: Daneil Dolin, MD;  [facility];   Service: Endoscopy;  Laterality: N/A;  9:45  . Cardiac catheterization    . Subxyphoid pericardial window N/A 06/23/2013    Procedure: SUBXYPHOID PERICARDIAL WINDOW;  Surgeon: Grace Isaac, MD;  Location: [place];  Service: Thoracic;  Laterality: N/A;  . Intraoperative transesophageal echocardiogram N/A 06/23/2013    Procedure: INTRAOPERATIVE TRANSESOPHAGEAL ECHOCARDIOGRAM;  Surgeon: Grace Isaac, MD;  Location: [place];  Service: Open Heart Surgery;  Laterality: N/A;    History   Social History  . Marital Status: Married    Spouse Name: N/A    Number of Children: 2  . Years of Education: N/A   Occupational History  . Shipping     [employer]   Social History Main Topics  . Smoking status: Never Smoker   . Smokeless tobacco: Never Used  . Alcohol Use: No     Comment: heavy etoh use 30 years ago  . Drug Use: No  . Sexual Activity: Yes    Birth Control/ Protection: None   Other Topics Concern  . 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 Disabled at this point.     No family history of premature CAD in 1st degree relatives.  Prior to Admission medications   Medication Sig Start Date End Date Taking? Authorizing Provider  Alcohol Swabs (ALCOHOL PREP) 70 % PADS  11/07/13   Historical Provider, MD  ALPRAZolam (XANAX) 0.5 MG tablet TAKE 1/2 TO 1 TABLET BY MOUTH AS NEEDED FOR SLEEP.    [name], MD  aspirin EC 81 MG tablet Take 81 mg by mouth at bedtime.     Historical Provider, MD  ASSURE COMFORT LANCETS 30G [place]  11/07/13   Historical Provider, MD  BD PEN NEEDLE NANO U/F 32G X 4 MM MISC  11/07/13   Historical Provider, MD  bisacodyl (DULCOLAX) 5 MG EC tablet Take 2 tablets (10 mg total) by mouth daily as needed for constipation. 06/26/13   Roger A Arguello, PA-C  Blood Glucose Calibration (EMBRACE CONTROL) LOW SOLN  11/07/13   Historical Provider, MD  Blood Glucose Monitoring Suppl (EMBRACE BLOOD GLUCOSE  MONITOR) DEVI  11/07/13   Historical Provider, MD  budesonide-formoterol (SYMBICORT) 160-4.5 MCG/ACT inhaler Inhale 2 puffs into the lungs 4 (four) times daily.    Historical Provider, MD  clopidogrel (PLAVIX) 75 MG tablet TAKE ONE TABLET BY MOUTH EVERY DAY    Herminio Commons, MD  doxazosin (CARDURA) 4 MG tablet  10/12/13   Historical Provider, MD  [facility] BLOOD GLUCOSE TEST test strip  11/07/13   Historical Provider, MD  fenofibrate 160 MG tablet Take 160 mg by mouth at bedtime.    Historical Provider, MD  hydrALAZINE (APRESOLINE) 25 MG tablet Take 0.5 tablets (12.5 mg total) by mouth 2 (two) times daily. 11/24/13   Lenise Herald  Bronson Ing, MD  Insulin Glargine (LANTUS SOLOSTAR) 100 UNIT/ML Solostar Pen Inject 60-80 Units into the skin at bedtime.     Historical Provider, MD  Lancet Devices (ADJUSTABLE LANCING DEVICE) MISC  11/07/13   Historical Provider, MD  metoprolol tartrate (LOPRESSOR) 25 MG tablet Take 1 tablet (25 mg total) by mouth 2 (two) times daily. 06/26/13   Roger A Arguello, PA-C  nitroGLYCERIN (NITROSTAT) 0.4 MG SL tablet Place 1 tablet (0.4 mg total) under the tongue every 5 (five) minutes x 3 doses as needed for chest pain. 09/16/12   Rogelia Mire, NP  ofloxacin (FLOXIN) 0.3 % otic solution Place 5 drops into the left ear 2 (two) times daily. 12/08/13   Mikey Kirschner, MD  torsemide (DEMADEX) 20 MG tablet Take 20 mg by mouth 2 (two) times daily.    Historical Provider, MD  traMADol (ULTRAM) 50 MG tablet TAKE 1 TABLET BY MOUTH EVERY 6 HOURS AS NEEDED FOR PAIN.    [name], MD  valsartan (DIOVAN) 80 MG tablet Take 80 mg by mouth daily.  10/09/13   Historical Provider, MD     Review of systems complete and found to be negative unless listed above in HPI     Physical exam Blood pressure 162/84, pulse 76, temperature 98.4 F (36.9 C), temperature source Oral, resp. rate 17, height 6\' 1"  (1.854 m), weight 258 lb (117.028 kg), SpO2 98.00%. General: NAD Neck: No JVD, no  thyromegaly or thyroid nodule.  Lungs: Clear to auscultation bilaterally with normal respiratory effort. CV: Nondisplaced PMI.  Heart regular S1/S2, no S3/S4, no murmur.  1+ pitting pretibial and periankle edema b/l, left > right. No calf tenderness, no palpable cords. No carotid bruit.  Normal pedal pulses.  Abdomen: Soft, nontender, no hepatosplenomegaly, no distention.  Skin: Intact without lesions or rashes.  Neurologic: Alert and oriented x 3.  Psych: Normal affect. Extremities: No clubbing or cyanosis.  HEENT: Normal.   Labs:   Lab Results  Component Value Date   WBC 6.9 12/13/2013   HGB 10.9* 12/13/2013   HCT 31.3* 12/13/2013   MCV 89.2 12/13/2013   PLT 186 12/13/2013    Recent Labs Lab 12/13/13 0119  NA 136*  K 4.9  CL 99  CO2 23  BUN 49*  CREATININE 2.22*  CALCIUM 9.8  PROT 7.0  BILITOT 0.2*  ALKPHOS 63  ALT 24  AST 26  GLUCOSE 326*   Lab Results  Component Value Date   CKTOTAL 179 08/22/2011   CKMB 4.9* 08/22/2011   TROPONINI <0.30 12/13/2013    Lab Results  Component Value Date   CHOL 278* 08/04/2013   CHOL 241* 05/11/2013   CHOL 229* 09/09/2012   Lab Results  Component Value Date   HDL 36* 08/04/2013   HDL 35* 05/11/2013   HDL 36* 09/09/2012   Lab Results  Component Value Date   LDLCALC Comment:   Not calculated due to Triglyceride >400. Suggest ordering Direct LDL (Unit Code: [phone]).   Total Cholesterol/HDL Ratio:CHD Risk                        Coronary Heart Disease Risk Table                                        Men       Women  1/2 Average Risk              3.4        3.3              Average Risk              5.0        4.4           2X Average Risk              9.6        7.1           3X Average Risk             23.4       11.0 Use the calculated Patient Ratio above and the CHD Risk table  to determine the patient's CHD Risk. ATP III Classification (LDL):       < 100        mg/dL         Optimal      100 - 129     mg/dL         Near or Above  Optimal      130 - 159     mg/dL         Borderline High      160 - 189     mg/dL         High       > 190        mg/dL         Very High   08/04/2013   LDLCALC Comment:   Not calculated due to Triglyceride >400. Suggest ordering Direct LDL (Unit Code: [phone]).   Total Cholesterol/HDL Ratio:CHD Risk                        Coronary Heart Disease Risk Table                                        Men       Women          1/2 Average Risk              3.4        3.3              Average Risk              5.0        4.4           2X Average Risk              9.6        7.1           3X Average Risk             23.4       11.0 Use the calculated Patient Ratio above and the CHD Risk table  to determine the patient's CHD Risk. ATP III Classification (LDL):       < 100        mg/dL         Optimal      100 - 129     mg/dL         Near or Above Optimal      130 - 159  mg/dL         Borderline High      160 - 189     mg/dL         High       > 190        mg/dL         Very High   05/11/2013   LDLCALC 117* 09/09/2012   Lab Results  Component Value Date   TRIG 512* 08/04/2013   TRIG 621* 05/11/2013   TRIG 381* 09/09/2012   Lab Results  Component Value Date   CHOLHDL 7.7 08/04/2013   CHOLHDL 6.9 05/11/2013   CHOLHDL 6.4 09/09/2012   No results found for this basename: LDLDIRECT        Studies: Dg Chest 2 View  12/13/2013   CLINICAL DATA:  Shortness of breath, chest pain.  EXAM: CHEST  2 VIEW  COMPARISON:  10/15/2013.  FINDINGS: Heart is borderline in size. Minimal bibasilar atelectasis. No effusions or edema. No acute bony abnormality.  IMPRESSION: Minimal bibasilar atelectasis.   Electronically Signed   By: Rolm Baptise M.D.   On: 12/13/2013 01:41    ASSESSMENT AND PLAN: 1. Chest pain and generalized weakness with mildly increasing SOB in the setting of known CAD with prior MI and PCI: Troponins normal x 2. Will plan for Lexiscan on 4/20 to evaluate for an ischemic etiology. Continue ASA, Plavix, and  metoprolol. Did not tolerate Imdur in the past. Would consider Ranexa depending on results of stress testing. 2. Hypertension: Hydralazine started in 10/2013, as he did not tolerate increased doses of valsartan in the past. Elevated today but has not received home meds. Will restart valsartan and hydralazine. He previously told me he would probably not take hydralazine tid.  3. Chronic diastolic heart failure: He has increased leg swelling. I will given IV Lasix 40 mg x 1 but will decrease torsemide 20 mg daily, as GFR has gone from 62 mL/min in 05/2013 to 31 mL/min. 4. Hyperlipidemia: intolerant to statins (muscle aches). Continue flaxseed oil, fenofibrate, and therapeutic lifestyle changes.  5. Pericardial effusion s/p pericardial window: Echo on 4/1 showed effusion is small to moderate in size. Repeat echo in 3-4 months. 6. COPD: stable on Symbicort. 7. Diarrhea with nausea: Will send stool for cultures. 8. CKD: Has progressively worsened from 2014 until now. Will decrease home dose of torsemide. GFR has gone from 62 mL/min in 05/2013 to 31 mL/min.   Signed: Kate Sable, M.D., F.A.C.C.  12/13/2013, 10:21 AM

## 2013-12-13 NOTE — ED Notes (Signed)
[facility] here to transport pt, pt stable on transfer.

## 2013-12-14 ENCOUNTER — Inpatient Hospital Stay (HOSPITAL_COMMUNITY): Payer: Medicare HMO

## 2013-12-14 DIAGNOSIS — N189 Chronic kidney disease, unspecified: Secondary | ICD-10-CM

## 2013-12-14 DIAGNOSIS — S37009A Unspecified injury of unspecified kidney, initial encounter: Secondary | ICD-10-CM | POA: Insufficient documentation

## 2013-12-14 DIAGNOSIS — I5032 Chronic diastolic (congestive) heart failure: Secondary | ICD-10-CM

## 2013-12-14 DIAGNOSIS — N179 Acute kidney failure, unspecified: Secondary | ICD-10-CM | POA: Diagnosis present

## 2013-12-14 DIAGNOSIS — I7090 Unspecified atherosclerosis: Secondary | ICD-10-CM

## 2013-12-14 DIAGNOSIS — I2510 Atherosclerotic heart disease of native coronary artery without angina pectoris: Secondary | ICD-10-CM

## 2013-12-14 LAB — TROPONIN I

## 2013-12-14 LAB — GLUCOSE, CAPILLARY
GLUCOSE-CAPILLARY: 379 mg/dL — AB (ref 70–99)
Glucose-Capillary: 233 mg/dL — ABNORMAL HIGH (ref 70–99)

## 2013-12-14 MED ORDER — TECHNETIUM TC 99M SESTAMIBI GENERIC - CARDIOLITE
10.0000 | Freq: Once | INTRAVENOUS | Status: AC | PRN
  Administered 2013-12-14: 10 via INTRAVENOUS

## 2013-12-14 MED ORDER — REGADENOSON 0.4 MG/5ML IV SOLN
INTRAVENOUS | Status: AC
  Administered 2013-12-14: 0.4 mg via INTRAVENOUS
  Filled 2013-12-14: qty 5

## 2013-12-14 MED ORDER — TECHNETIUM TC 99M SESTAMIBI GENERIC - CARDIOLITE
30.0000 | Freq: Once | INTRAVENOUS | Status: AC | PRN
  Administered 2013-12-14: 30 via INTRAVENOUS

## 2013-12-14 MED ORDER — TORSEMIDE 20 MG PO TABS: 20.0000 mg | ORAL_TABLET | Freq: Every day | ORAL | Status: DC

## 2013-12-14 NOTE — Progress Notes (Addendum)
Subjective:  No chest pain  Objective:  Vital Signs in the last 24 hours: Temp:  [98.3 F (36.8 C)-98.4 F (36.9 C)] 98.4 F (36.9 C) (04/20 0526) Pulse Rate:  [69-77] 69 (04/20 0526) Resp:  [18] 18 (04/20 0526) BP: (125-183)/(58-81) 128/61 mmHg (04/20 0915) SpO2:  [97 %-98 %] 97 % (04/20 0526) Weight:  [258 lb 2.5 oz (117.1 kg)] 258 lb 2.5 oz (117.1 kg) (04/20 0526)  Intake/Output from previous day:  Intake/Output Summary (Last 24 hours) at 12/14/13 0916 Last data filed at 12/14/13 [id number]  Gross per 24 hour  Intake      0 ml  Output   1250 ml  Net  -1250 ml    Physical Exam: General appearance: alert, cooperative, no distress and moderately obese Lungs: clear to auscultation bilaterally Heart: regular rate and rhythm   Rate: 70  Rhythm: normal sinus rhythm  Lab Results:  Recent Labs  12/13/13 0119  WBC 6.9  HGB 10.9*  PLT 186    Recent Labs  12/13/13 0119  NA 136*  K 4.9  CL 99  CO2 23  GLUCOSE 326*  BUN 49*  CREATININE 2.22*    Recent Labs  12/13/13 1838 12/13/13 2324  TROPONINI <0.30 <0.30   No results found for this basename: INR,  in the last 72 hours  Imaging: Imaging results have been reviewed  Cardiac Studies:  Assessment/Plan:   Active Problems:   CAD- MI/RCA PCI-DES x2 2012, and RCA DES Jan 2014   DM type 2 (diabetes mellitus, type 2)   Acute on chronic renal insufficiency   Chronic diastolic CHF (congestive heart failure)   Hypertension   Hyperlipidemia   Obstructive sleep apnea- on C-pap   Chest pain  CKD stage IV  PLAN:   Myoview: MYOCARDIAL IMAGING WITH SPECT (REST AND PHARMACOLOGIC-STRESS)  GATED LEFT VENTRICULAR WALL MOTION STUDY  LEFT VENTRICULAR EJECTION FRACTION  TECHNIQUE:  Standard myocardial SPECT imaging was performed after resting  intravenous injection of 10 mCi Tc-16m sestamibi. Subsequently,  intravenous infusion of Lexiscan was performed under the supervision  of the Cardiology staff. At peak  effect of the drug, 30 mCi Tc-24m  sestamibi was injected intravenously and standard myocardial SPECT  imaging was performed. Quantitative gated imaging was also performed  to evaluate left ventricular wall motion, and estimate left  ventricular ejection fraction.  COMPARISON: Nuclear myocardial study 10/10/2011  FINDINGS:  A fixed defect is evident along the anterior inferior and septal  wall compatible with remote ischemia. No reversible ischemia is  present.  There is some hypokinesis within the anterior inferior and septal  portions of the left ventricle. Contractility and wall motion is  otherwise normal.  Normal function is preserved. The estimated end-diastolic volume is  123456 mL. The estimated systolic volume is 42 mL. The calculated  ejection fraction is 60%.  IMPRESSION:  1. No evidence for inducible ischemia.  2. Remote infarct involving the anterior inferior and septal wall.  3. Focal hypokinesis associated with the area of remote infarct.  4. Preserved cardiac function with an estimated ejection fraction of  60%.  Electronically Signed  By: Lawrence Santiago M.D.  On: 12/14/2013 11:57    Kerin Ransom PA-C Beeper [id number] 12/14/2013, 9:16 AM   Patient seen and examined and history reviewed. Agree with above findings and plan. Feels very well. No chest pain or SOB. States blood sugar elevated due to the fact that he did not get his evening insulin the night before last. States  BS at home is around 130. Myovew shows no ischemia. Small anteroseptal scar. EF 60%. Will plan DC today. Follow up with Dr. Bronson Ing in 2 weeks. Needs BMET checked. Reduce torsemide dose to once a day.   Ander Slade [facility] 12/14/2013 12:53 PM

## 2013-12-14 NOTE — Clinical Documentation Improvement (Signed)
Possible Clinical Conditions?    _______CKD Stage II - GFR 60-80 _______CKD Stage III - GFR 30-59 _______CKD Stage IV - GFR 15-29 _______CKD Stage V - GFR < 15 _______ESRD (End Stage Renal Disease) _______Other condition _______Cannot Clinically determine     Risk Factors:  CKD progressively worsened from 2014 until now per 4/19 progress notes.  Diagnostics:  4/19:  Bun: 49 4/19:  Creat: 2.22 4/19:  GFR: 31  Thank You, Theron Arista, Clinical Documentation Specialist:  [place] Information Management

## 2013-12-14 NOTE — Discharge Summary (Signed)
Patient seen and examined and history reviewed. Agree with above findings and plan. See my rounding note.  Ander Slade JordanMD 12/14/2013 2:03 PM

## 2013-12-14 NOTE — Discharge Instructions (Signed)
Chest Pain (Nonspecific) °It is often hard to give a specific diagnosis for the cause of chest pain. There is always a chance that your pain could be related to something serious, such as a heart attack or a blood clot in the lungs. You need to follow up with your caregiver for further evaluation. °CAUSES  °· Heartburn. °· Pneumonia or bronchitis. °· Anxiety or stress. °· Inflammation around your heart (pericarditis) or lung (pleuritis or pleurisy). °· A blood clot in the lung. °· A collapsed lung (pneumothorax). It can develop suddenly on its own (spontaneous pneumothorax) or from injury (trauma) to the chest. °· Shingles infection (herpes zoster virus). °The chest wall is composed of bones, muscles, and cartilage. Any of these can be the source of the pain. °· The bones can be bruised by injury. °· The muscles or cartilage can be strained by coughing or overwork. °· The cartilage can be affected by inflammation and become sore (costochondritis). °DIAGNOSIS  °Lab tests or other studies, such as X-rays, electrocardiography, stress testing, or cardiac imaging, may be needed to find the cause of your pain.  °TREATMENT  °· Treatment depends on what may be causing your chest pain. Treatment may include: °· Acid blockers for heartburn. °· Anti-inflammatory medicine. °· Pain medicine for inflammatory conditions. °· Antibiotics if an infection is present. °· You may be advised to change lifestyle habits. This includes stopping smoking and avoiding alcohol, caffeine, and chocolate. °· You may be advised to keep your head raised (elevated) when sleeping. This reduces the chance of acid going backward from your stomach into your esophagus. °· Most of the time, nonspecific chest pain will improve within 2 to 3 days with rest and mild pain medicine. °HOME CARE INSTRUCTIONS  °· If antibiotics were prescribed, take your antibiotics as directed. Finish them even if you start to feel better. °· For the next few days, avoid physical  activities that bring on chest pain. Continue physical activities as directed. °· Do not smoke. °· Avoid drinking alcohol. °· Only take over-the-counter or prescription medicine for pain, discomfort, or fever as directed by your caregiver. °· Follow your caregiver's suggestions for further testing if your chest pain does not go away. °· Keep any follow-up appointments you made. If you do not go to an appointment, you could develop lasting (chronic) problems with pain. If there is any problem keeping an appointment, you must call to reschedule. °SEEK MEDICAL CARE IF:  °· You think you are having problems from the medicine you are taking. Read your medicine instructions carefully. °· Your chest pain does not go away, even after treatment. °· You develop a rash with blisters on your chest. °SEEK IMMEDIATE MEDICAL CARE IF:  °· You have increased chest pain or pain that spreads to your arm, neck, jaw, back, or abdomen. °· You develop shortness of breath, an increasing cough, or you are coughing up blood. °· You have severe back or abdominal pain, feel nauseous, or vomit. °· You develop severe weakness, fainting, or chills. °· You have a fever. °THIS IS AN EMERGENCY. Do not wait to see if the pain will go away. Get medical help at once. Call your local emergency services (911 in [place] Do not drive yourself to the hospital. °MAKE SURE YOU:  °· Understand these instructions. °· Will watch your condition. °· Will get help right away if you are not doing well or get worse. °Document Released: 05/23/2005 Document Revised: 11/05/2011 Document Reviewed: 03/18/2008 °ExitCare® Patient Information ©2014 [employer]   [employer]. ° °

## 2013-12-14 NOTE — Discharge Summary (Signed)
Patient ID: Eugene [name],  MRN: [id number], DOB/AGE: [birth year] 58 y.o.  Admit date: 12/13/2013 Discharge date: 12/14/2013  Primary Care Provider: Dr [name] Primary Cardiologist: Dr Bronson Ing  Discharge Diagnoses Active Problems:   CAD- MI/RCA PCI-DES x2 2012, and RCA DES Jan 2014   DM type 2 (diabetes mellitus, type 2)   Acute on chronic renal insufficiency   Chronic diastolic CHF (congestive heart failure)   Chronic renal insufficiency, stage III (moderate)   Hypertension   Hyperlipidemia   Obstructive sleep apnea- on C-pap   Chest pain    Procedures: [facility] 12/14/13   Hospital Course:   [name] is a 58 yr old male with CAD with prior RCA stent in 2014, pericardial effusion s/p window, DM, HTN, hyperlipidemia, COPD, OSA, and diastolic heart failure. He has a history of STEMI in Jan 2014. Cath showed LM patent, LAD 50% proximal, 50% D1, LCX patent, RCA 30-40% in previously placed stent and 100% distal occlusion. RCA received a DES.              I saw him in the clinic last month. He was hospitalized in February for increasing dyspnea on exertion. My colleague Dr. [name] saw him in consultation and did not feel it was cardiac in etiology, specifically not feeling that he had signs of volume overload or ischemia. Echocardiogram at that time showed a small to moderate circumferential pericardial effusion with no evidence of tamponade physiology, EF 60-65%.               He then saw his pulmonologist and it was deemed his shortness of breath was due to COPD, and was started on Symbicort twice daily and had been feeling much better.               He was admitted 12/13/13 with weakness, and dyspnea. He says his symptoms are similar to what he experienced prior to his MI in 2014. ECG showed sinus rhythm, incomplete RBBB, TWI in I and aVL, and old inferior infarct. When compared to ECG on 2/19, TWI in I are new and findings in aVL are more pronounced. Troponins x  2 were normal. Lexsican Myoview 12/14/13 was negative for ischemia. He was noted to have an elevated SCr and his Demadex was cut back and he will need a follow up BMP in a week, he is still on his ARB.    Discharge Vitals:  Blood pressure 145/75, pulse 84, temperature 98.4 F (36.9 C), temperature source Oral, resp. rate 18, height '6\' 1"'  (1.854 m), weight 258 lb 2.5 oz (117.1 kg), SpO2 97.00%.    Labs: Results for orders placed during the hospital encounter of 12/13/13 (from the past 48 hour(s))  CBC WITH DIFFERENTIAL     Status: Abnormal   Collection Time    12/13/13  1:19 AM      Result Value Ref Range   WBC 6.9  4.0 - 10.5 K/uL   RBC 3.51 (*) 4.22 - 5.81 MIL/uL   Hemoglobin 10.9 (*) 13.0 - 17.0 g/dL   HCT 31.3 (*) 39.0 - 52.0 %   MCV 89.2  78.0 - 100.0 fL   MCH 31.1  26.0 - 34.0 pg   MCHC 34.8  30.0 - 36.0 g/dL   RDW 12.9  11.5 - 15.5 %   Platelets 186  150 - 400 K/uL   Neutrophils Relative % 62  43 - 77 %   Neutro Abs 4.3  1.7 - 7.7  K/uL   Lymphocytes Relative 30  12 - 46 %   Lymphs Abs 2.1  0.7 - 4.0 K/uL   Monocytes Relative 7  3 - 12 %   Monocytes Absolute 0.5  0.1 - 1.0 K/uL   Eosinophils Relative 1  0 - 5 %   Eosinophils Absolute 0.1  0.0 - 0.7 K/uL   Basophils Relative 0  0 - 1 %   Basophils Absolute 0.0  0.0 - 0.1 K/uL  COMPREHENSIVE METABOLIC PANEL     Status: Abnormal   Collection Time    12/13/13  1:19 AM      Result Value Ref Range   Sodium 136 (*) 137 - 147 mEq/L   Potassium 4.9  3.7 - 5.3 mEq/L   Chloride 99  96 - 112 mEq/L   CO2 23  19 - 32 mEq/L   Glucose, Bld 326 (*) 70 - 99 mg/dL   BUN 49 (*) 6 - 23 mg/dL   Creatinine, Ser 2.22 (*) 0.50 - 1.35 mg/dL   Calcium 9.8  8.4 - 10.5 mg/dL   Total Protein 7.0  6.0 - 8.3 g/dL   Albumin 3.3 (*) 3.5 - 5.2 g/dL   AST 26  0 - 37 U/L   ALT 24  0 - 53 U/L   Alkaline Phosphatase 63  39 - 117 U/L   Total Bilirubin 0.2 (*) 0.3 - 1.2 mg/dL   GFR calc non Af Amer 31 (*) >90 mL/min   GFR calc Af Amer 36 (*) >90  mL/min   Comment: (NOTE)     The eGFR has been calculated using the CKD EPI equation.     This calculation has not been validated in all clinical situations.     eGFR's persistently <90 mL/min signify possible Chronic Kidney     Disease.  TROPONIN I     Status: None   Collection Time    12/13/13  1:19 AM      Result Value Ref Range   Troponin I <0.30  <0.30 ng/mL   Comment:            Due to the release kinetics of cTnI,     a negative result within the first hours     of the onset of symptoms does not rule out     myocardial infarction with certainty.     If myocardial infarction is still suspected,     repeat the test at appropriate intervals.  URINALYSIS, ROUTINE W REFLEX MICROSCOPIC     Status: Abnormal   Collection Time    12/13/13  1:57 AM      Result Value Ref Range   Color, Urine YELLOW  YELLOW   APPearance CLEAR  CLEAR   Specific Gravity, Urine 1.025  1.005 - 1.030   pH 5.5  5.0 - 8.0   Glucose, UA >1000 (*) NEGATIVE mg/dL   Hgb urine dipstick SMALL (*) NEGATIVE   Bilirubin Urine NEGATIVE  NEGATIVE   Ketones, ur NEGATIVE  NEGATIVE mg/dL   Protein, ur 100 (*) NEGATIVE mg/dL   Urobilinogen, UA 0.2  0.0 - 1.0 mg/dL   Nitrite NEGATIVE  NEGATIVE   Leukocytes, UA NEGATIVE  NEGATIVE  URINE MICROSCOPIC-ADD ON     Status: Abnormal   Collection Time    12/13/13  1:57 AM      Result Value Ref Range   Squamous Epithelial / LPF RARE  RARE   WBC, UA 0-2  <3 WBC/hpf   RBC / HPF  0-2  <3 RBC/hpf   Bacteria, UA RARE  RARE   Casts HYALINE CASTS (*) NEGATIVE  CBG MONITORING, ED     Status: Abnormal   Collection Time    12/13/13  3:56 AM      Result Value Ref Range   Glucose-Capillary 164 (*) 70 - 99 mg/dL  TROPONIN I     Status: None   Collection Time    12/13/13  4:13 AM      Result Value Ref Range   Troponin I <0.30  <0.30 ng/mL   Comment:            Due to the release kinetics of cTnI,     a negative result within the first hours     of the onset of symptoms does not  rule out     myocardial infarction with certainty.     If myocardial infarction is still suspected,     repeat the test at appropriate intervals.  GLUCOSE, CAPILLARY     Status: Abnormal   Collection Time    12/13/13  8:14 AM      Result Value Ref Range   Glucose-Capillary 183 (*) 70 - 99 mg/dL  STOOL CULTURE     Status: None   Collection Time    12/13/13 10:52 AM      Result Value Ref Range   Specimen Description STOOL     Special Requests NONE     Culture       Value: Culture reincubated for better growth     Performed at [facility]   Report Status PENDING    TROPONIN I     Status: None   Collection Time    12/13/13 11:30 AM      Result Value Ref Range   Troponin I <0.30  <0.30 ng/mL   Comment:            Due to the release kinetics of cTnI,     a negative result within the first hours     of the onset of symptoms does not rule out     myocardial infarction with certainty.     If myocardial infarction is still suspected,     repeat the test at appropriate intervals.  GLUCOSE, CAPILLARY     Status: Abnormal   Collection Time    12/13/13 11:33 AM      Result Value Ref Range   Glucose-Capillary 300 (*) 70 - 99 mg/dL  GLUCOSE, CAPILLARY     Status: Abnormal   Collection Time    12/13/13  4:14 PM      Result Value Ref Range   Glucose-Capillary 275 (*) 70 - 99 mg/dL  TROPONIN I     Status: None   Collection Time    12/13/13  6:38 PM      Result Value Ref Range   Troponin I <0.30  <0.30 ng/mL   Comment:            Due to the release kinetics of cTnI,     a negative result within the first hours     of the onset of symptoms does not rule out     myocardial infarction with certainty.     If myocardial infarction is still suspected,     repeat the test at appropriate intervals.  GLUCOSE, CAPILLARY     Status: Abnormal   Collection Time    12/13/13  8:47 PM      Result Value  Ref Range   Glucose-Capillary 278 (*) 70 - 99 mg/dL  TROPONIN I     Status: None    Collection Time    12/13/13 11:24 PM      Result Value Ref Range   Troponin I <0.30  <0.30 ng/mL   Comment:            Due to the release kinetics of cTnI,     a negative result within the first hours     of the onset of symptoms does not rule out     myocardial infarction with certainty.     If myocardial infarction is still suspected,     repeat the test at appropriate intervals.  GLUCOSE, CAPILLARY     Status: Abnormal   Collection Time    12/14/13  7:51 AM      Result Value Ref Range   Glucose-Capillary 233 (*) 70 - 99 mg/dL  GLUCOSE, CAPILLARY     Status: Abnormal   Collection Time    12/14/13 11:46 AM      Result Value Ref Range   Glucose-Capillary 379 (*) 70 - 99 mg/dL    Disposition:    Discharge Medications:    Medication List    ASK your doctor about these medications       ALPRAZolam 0.5 MG tablet  Commonly known as:  XANAX  Take 0.25-0.5 mg by mouth at bedtime as needed for sleep.     aspirin EC 81 MG tablet  Take 81 mg by mouth at bedtime.     bisacodyl 5 MG EC tablet  Commonly known as:  DULCOLAX  Take 2 tablets (10 mg total) by mouth daily as needed for constipation.     budesonide-formoterol 160-4.5 MCG/ACT inhaler  Commonly known as:  SYMBICORT  Inhale 2 puffs into the lungs 2 (two) times daily.     clopidogrel 75 MG tablet  Commonly known as:  PLAVIX  Take 75 mg by mouth at bedtime.     doxazosin 4 MG tablet  Commonly known as:  CARDURA  Take 2 mg by mouth at bedtime.     fenofibrate 160 MG tablet  Take 160 mg by mouth at bedtime.     hydrALAZINE 25 MG tablet  Commonly known as:  APRESOLINE  Take 0.5 tablets (12.5 mg total) by mouth 2 (two) times daily.     LANTUS SOLOSTAR 100 UNIT/ML Solostar Pen  Generic drug:  Insulin Glargine  Inject 60-80 Units into the skin at bedtime.     methylcellulose 1 % ophthalmic solution  Commonly known as:  ARTIFICIAL TEARS  Place 2 drops into the left eye 2 (two) times daily as needed (dry eye).       metoprolol tartrate 25 MG tablet  Commonly known as:  LOPRESSOR  Take 1 tablet (25 mg total) by mouth 2 (two) times daily.     nitroGLYCERIN 0.4 MG SL tablet  Commonly known as:  NITROSTAT  Place 1 tablet (0.4 mg total) under the tongue every 5 (five) minutes x 3 doses as needed for chest pain.     ofloxacin 0.3 % otic solution  Commonly known as:  FLOXIN  Place 5 drops into the left ear 2 (two) times daily.     torsemide 20 MG tablet  Commonly known as:  DEMADEX  Take 20 mg by mouth 2 (two) times daily.     traMADol 50 MG tablet  Commonly known as:  ULTRAM  Take 50 mg by mouth  every 6 (six) hours as needed for moderate pain.     valsartan 80 MG tablet  Commonly known as:  DIOVAN  Take 80 mg by mouth every morning.         Duration of Discharge Encounter: Greater than 30 minutes including physician time.  Angelena Form PA-C 12/14/2013 1:03 PM

## 2013-12-14 NOTE — Progress Notes (Signed)
UR Completed Tallen Schnorr Graves-Bigelow, RN,BSN [phone]  

## 2013-12-14 NOTE — Progress Notes (Addendum)
Inpatient Diabetes Program Recommendations  AACE/ADA: New Consensus Statement on Inpatient Glycemic Control (2013)  Target Ranges:  Prepandial:   less than 140 mg/dL      Peak postprandial:   less than 180 mg/dL (1-2 hours)      Critically ill patients:  140 - 180 mg/dL    Inpatient Diabetes Program Recommendations Correction (SSI): While NPO please change correction Novolog to q 4 hrs. Noted HgbA1C value this admission of 8.9%. However this pt's Hgb/Hct are low with chronic renal failure. Therefore, this lab may not be accurate.  Thank you, Rosita Kea, RN, CNS, Diabetes Coordinator [phone])

## 2013-12-14 NOTE — Progress Notes (Signed)
D/c orders received;IV removed with gauze on, pt remains in stable condition, pt meds and given to pt; pt d/c to home; prescription given for pt to get blood drawn, pt made aware that he needs it drawn in a week

## 2013-12-17 LAB — STOOL CULTURE

## 2013-12-28 ENCOUNTER — Other Ambulatory Visit: Payer: Self-pay | Admitting: Family Medicine

## 2013-12-30 ENCOUNTER — Encounter: Payer: Medicare HMO | Admitting: Cardiovascular Disease

## 2013-12-30 ENCOUNTER — Encounter: Payer: Self-pay | Admitting: Cardiovascular Disease

## 2013-12-30 ENCOUNTER — Telehealth: Payer: Self-pay | Admitting: Family Medicine

## 2013-12-30 MED ORDER — ALPRAZOLAM 0.5 MG PO TABS: 0.2500 mg | ORAL_TABLET | Freq: Every evening | ORAL | Status: DC | PRN

## 2013-12-30 NOTE — Telephone Encounter (Signed)
Patient needs Rx for alprazolam .5 mg tab

## 2013-12-30 NOTE — Telephone Encounter (Signed)
Done

## 2013-12-30 NOTE — Telephone Encounter (Signed)
Last seen 12/08/13 (sick)

## 2013-12-30 NOTE — Telephone Encounter (Signed)
Refill times 4, f/u OV by July

## 2014-01-19 ENCOUNTER — Telehealth: Payer: Self-pay | Admitting: Family Medicine

## 2014-01-19 DIAGNOSIS — E119 Type 2 diabetes mellitus without complications: Secondary | ICD-10-CM

## 2014-01-19 DIAGNOSIS — E871 Hypo-osmolality and hyponatremia: Secondary | ICD-10-CM

## 2014-01-19 NOTE — Telephone Encounter (Signed)
Pt wants to know if he needs BW for is appt 6/10 If so call when done

## 2014-01-20 NOTE — Telephone Encounter (Signed)
This patient needs CBC, hemoglobin A1c. It would be best for this blood work to be done June 13 or later so that Medicare will pay for it. His office visit ought to be after the blood work. In other words might not be a bad idea for him to reschedule his office visit 10-14 days later so that he could do the blood work have it done, have it paid for by Medicare, and be seen for follow up

## 2014-01-20 NOTE — Telephone Encounter (Signed)
On 3/13 had:  Mag, Met7, CBC, Ferritin, Iron/TIBC, HgA1C.

## 2014-01-20 NOTE — Telephone Encounter (Signed)
BW orders are in. Spoke with patient and explained that he needed to change his appt to a later date after June 13. He verbalized understanding. I transferred him up front to make an appt.

## 2014-01-21 NOTE — Telephone Encounter (Signed)
Rescheduled for 6/19

## 2014-01-25 ENCOUNTER — Emergency Department (HOSPITAL_COMMUNITY): Payer: Medicare HMO

## 2014-01-25 ENCOUNTER — Inpatient Hospital Stay (HOSPITAL_COMMUNITY)
Admission: EM | Admit: 2014-01-25 | Discharge: 2014-01-27 | DRG: 292 | Disposition: A | Discharge status: Home Health | Payer: Medicare HMO | Attending: Internal Medicine | Admitting: Internal Medicine

## 2014-01-25 ENCOUNTER — Encounter (HOSPITAL_COMMUNITY): Payer: Self-pay | Admitting: Emergency Medicine

## 2014-01-25 DIAGNOSIS — I129 Hypertensive chronic kidney disease with stage 1 through stage 4 chronic kidney disease, or unspecified chronic kidney disease: Secondary | ICD-10-CM | POA: Diagnosis present

## 2014-01-25 DIAGNOSIS — I2510 Atherosclerotic heart disease of native coronary artery without angina pectoris: Secondary | ICD-10-CM

## 2014-01-25 DIAGNOSIS — Z87442 Personal history of urinary calculi: Secondary | ICD-10-CM

## 2014-01-25 DIAGNOSIS — N183 Chronic kidney disease, stage 3 unspecified: Secondary | ICD-10-CM

## 2014-01-25 DIAGNOSIS — I10 Essential (primary) hypertension: Secondary | ICD-10-CM

## 2014-01-25 DIAGNOSIS — Z833 Family history of diabetes mellitus: Secondary | ICD-10-CM

## 2014-01-25 DIAGNOSIS — N189 Chronic kidney disease, unspecified: Secondary | ICD-10-CM

## 2014-01-25 DIAGNOSIS — Z9861 Coronary angioplasty status: Secondary | ICD-10-CM

## 2014-01-25 DIAGNOSIS — Z794 Long term (current) use of insulin: Secondary | ICD-10-CM

## 2014-01-25 DIAGNOSIS — N400 Enlarged prostate without lower urinary tract symptoms: Secondary | ICD-10-CM | POA: Diagnosis present

## 2014-01-25 DIAGNOSIS — K219 Gastro-esophageal reflux disease without esophagitis: Secondary | ICD-10-CM

## 2014-01-25 DIAGNOSIS — Z9119 Patient's noncompliance with other medical treatment and regimen: Secondary | ICD-10-CM

## 2014-01-25 DIAGNOSIS — F411 Generalized anxiety disorder: Secondary | ICD-10-CM | POA: Diagnosis present

## 2014-01-25 DIAGNOSIS — N179 Acute kidney failure, unspecified: Secondary | ICD-10-CM

## 2014-01-25 DIAGNOSIS — R609 Edema, unspecified: Secondary | ICD-10-CM

## 2014-01-25 DIAGNOSIS — R079 Chest pain, unspecified: Secondary | ICD-10-CM

## 2014-01-25 DIAGNOSIS — E119 Type 2 diabetes mellitus without complications: Secondary | ICD-10-CM

## 2014-01-25 DIAGNOSIS — I5031 Acute diastolic (congestive) heart failure: Secondary | ICD-10-CM

## 2014-01-25 DIAGNOSIS — E1149 Type 2 diabetes mellitus with other diabetic neurological complication: Secondary | ICD-10-CM

## 2014-01-25 DIAGNOSIS — Z823 Family history of stroke: Secondary | ICD-10-CM

## 2014-01-25 DIAGNOSIS — R0600 Dyspnea, unspecified: Secondary | ICD-10-CM

## 2014-01-25 DIAGNOSIS — I5033 Acute on chronic diastolic (congestive) heart failure: Principal | ICD-10-CM

## 2014-01-25 DIAGNOSIS — D649 Anemia, unspecified: Secondary | ICD-10-CM

## 2014-01-25 DIAGNOSIS — Z91199 Patient's noncompliance with other medical treatment and regimen due to unspecified reason: Secondary | ICD-10-CM

## 2014-01-25 DIAGNOSIS — I509 Heart failure, unspecified: Secondary | ICD-10-CM | POA: Diagnosis present

## 2014-01-25 DIAGNOSIS — R633 Feeding difficulties: Secondary | ICD-10-CM

## 2014-01-25 DIAGNOSIS — G629 Polyneuropathy, unspecified: Secondary | ICD-10-CM

## 2014-01-25 DIAGNOSIS — E1142 Type 2 diabetes mellitus with diabetic polyneuropathy: Secondary | ICD-10-CM | POA: Diagnosis present

## 2014-01-25 DIAGNOSIS — E785 Hyperlipidemia, unspecified: Secondary | ICD-10-CM

## 2014-01-25 DIAGNOSIS — Z7982 Long term (current) use of aspirin: Secondary | ICD-10-CM

## 2014-01-25 DIAGNOSIS — E1165 Type 2 diabetes mellitus with hyperglycemia: Secondary | ICD-10-CM

## 2014-01-25 DIAGNOSIS — R638 Other symptoms and signs concerning food and fluid intake: Secondary | ICD-10-CM

## 2014-01-25 DIAGNOSIS — Z8249 Family history of ischemic heart disease and other diseases of the circulatory system: Secondary | ICD-10-CM

## 2014-01-25 DIAGNOSIS — G4733 Obstructive sleep apnea (adult) (pediatric): Secondary | ICD-10-CM

## 2014-01-25 DIAGNOSIS — I252 Old myocardial infarction: Secondary | ICD-10-CM

## 2014-01-25 LAB — CBC WITH DIFFERENTIAL/PLATELET
Basophils Absolute: 0 10*3/uL (ref 0.0–0.1)
Basophils Relative: 1 % (ref 0–1)
EOS ABS: 0.1 10*3/uL (ref 0.0–0.7)
EOS PCT: 2 % (ref 0–5)
HCT: 31.3 % — ABNORMAL LOW (ref 39.0–52.0)
HEMOGLOBIN: 11 g/dL — AB (ref 13.0–17.0)
LYMPHS ABS: 1.8 10*3/uL (ref 0.7–4.0)
Lymphocytes Relative: 30 % (ref 12–46)
MCH: 31.1 pg (ref 26.0–34.0)
MCHC: 35.1 g/dL (ref 30.0–36.0)
MCV: 88.4 fL (ref 78.0–100.0)
MONOS PCT: 7 % (ref 3–12)
Monocytes Absolute: 0.4 10*3/uL (ref 0.1–1.0)
Neutro Abs: 3.7 10*3/uL (ref 1.7–7.7)
Neutrophils Relative %: 60 % (ref 43–77)
Platelets: 190 10*3/uL (ref 150–400)
RBC: 3.54 MIL/uL — AB (ref 4.22–5.81)
RDW: 12.8 % (ref 11.5–15.5)
WBC: 6.1 10*3/uL (ref 4.0–10.5)

## 2014-01-25 LAB — PRO B NATRIURETIC PEPTIDE: Pro B Natriuretic peptide (BNP): 678.1 pg/mL — ABNORMAL HIGH (ref 0–125)

## 2014-01-25 LAB — BASIC METABOLIC PANEL
BUN: 37 mg/dL — ABNORMAL HIGH (ref 6–23)
CALCIUM: 9.2 mg/dL (ref 8.4–10.5)
CO2: 27 mEq/L (ref 19–32)
Chloride: 96 mEq/L (ref 96–112)
Creatinine, Ser: 1.4 mg/dL — ABNORMAL HIGH (ref 0.50–1.35)
GFR calc Af Amer: 63 mL/min — ABNORMAL LOW (ref >90)
GFR, EST NON AFRICAN AMERICAN: 54 mL/min — AB (ref >90)
GLUCOSE: 348 mg/dL — AB (ref 70–99)
Potassium: 4.9 mEq/L (ref 3.7–5.3)
Sodium: 135 mEq/L — ABNORMAL LOW (ref 137–147)

## 2014-01-25 LAB — TROPONIN I: Troponin I: <0.3 ng/mL (ref <0.30)

## 2014-01-25 MED ORDER — FUROSEMIDE 10 MG/ML IJ SOLN
80.0000 mg | Freq: Once | INTRAMUSCULAR | Status: AC
  Administered 2014-01-25: 80 mg via INTRAVENOUS
  Filled 2014-01-25: qty 8

## 2014-01-25 MED ORDER — NITROGLYCERIN 2 % TD OINT
1.0000 [in_us] | TOPICAL_OINTMENT | Freq: Once | TRANSDERMAL | Status: AC
  Administered 2014-01-25: 1 [in_us] via TOPICAL
  Filled 2014-01-25: qty 1

## 2014-01-25 MED ORDER — NITROGLYCERIN 0.4 MG SL SUBL: 0.4000 mg | SUBLINGUAL_TABLET | SUBLINGUAL | Status: DC | PRN

## 2014-01-25 NOTE — ED Provider Notes (Signed)
CSN: [id number]     Arrival date & time 01/25/14  2032 History  This chart was scribed for Babette Relic, MD by Vernell Barrier, ED scribe. This patient was seen in room APA12/APA12 and the patient's care was started at 9:16 PM.   Chief Complaint  Patient presents with  . Chest Pain   The history is provided by the patient. No language interpreter was used.   HPI Comments: Eugene [name] is a 58 y.o. male w/ hx of CAD s/p stent, COPD, obstructive sleep apnea, pericardial effusion in the past s/p window, diabetes, and heart failure presents to the Emergency Department complaining of constant mild localized non-radiating lower chest discomfort for the past few days. Slight cough w/ associated constant SOB for last 3 days. Worse with deep breathing and orthopnea. Fluid retention in lower extremities edema several days. Weight gain of about 6 lbs in the last week. Takes 2 water pills in the morning and 1 at night. Has not been using inhaler anymore than normal. Hospitalized 1 month ago for SOB and chest pain. Stress myoview test performed Apr2015 showed no evidence of ischemia. Normal echo LV function 60-65% Apr2015. Denies any other symptoms.   Past Medical History  Diagnosis Date  . Arteriosclerotic cardiovascular disease (ASCVD)     a. 05/2011 Cath/PCI: LM nl, LAD 53m, D1 small, D2 small 53m, LCX large 40p, RCA 50-60p, 99 hazy @ origin of PDA with 70-80 in PDA (2.5x26 Resolute Integrity & 3.0x15 Resolute Integrity DES).;  b. 08/2012 Inflat  STEMI/Cath/PCI: LM minor irregs, LAD 50p, D1 50, LCX nl, OM1 25, RCA 30-40p, 100d (treated with 2.75x40mm Promus Premier DES);  c. 08/2012 Echo: EF 55-60%, basal inferopost HK.  [name] Hyperlipidemia   . Diabetes mellitus     Peripheral neuropathy  . Bell palsy   . Hypertension   . COPD (chronic obstructive pulmonary disease)   . Sleep apnea   . Gallstones   . Cholelithiasis 07/2012    Asymptomatic; identified incidentally  . PONV (postoperative nausea and  vomiting)   . Anxiety   . C. difficile colitis     a. 08/2012  . Myocardial infarct 09/08/12  . Nephrolithiasis   . Contrast dye induced nephropathy     a. 08/2012 post cath/pci  . CHF (congestive heart failure)   . Asthma   . Heart disease   . Old myocardial infarction   . Anginal pain   . GERD (gastroesophageal reflux disease)    Past Surgical History  Procedure Laterality Date  . Circumcision    . Stents    . Esophagogastroduodenoscopy      in [place] over 20 yrs ago  . Colonoscopy      In [facility], approximately 2011 per patient, was normal. Advised to come back in 10 years.  . Esophagogastroduodenoscopy  06/12/2012    Procedure: ESOPHAGOGASTRODUODENOSCOPY (EGD);  Surgeon: Daneil Dolin, MD;  [facility];  Service: Endoscopy;  Laterality: N/A;  9:45  . Cardiac catheterization    . Subxyphoid pericardial window N/A 06/23/2013    Procedure: SUBXYPHOID PERICARDIAL WINDOW;  Surgeon: Grace Isaac, MD;  Location: [facility];  Service: Thoracic;  Laterality: N/A;  . Intraoperative transesophageal echocardiogram N/A 06/23/2013    Procedure: INTRAOPERATIVE TRANSESOPHAGEAL ECHOCARDIOGRAM;  Surgeon: Grace Isaac, MD;  Location: [facility];  Service: Open Heart Surgery;  Laterality: N/A;   Family History  Problem Relation Age of Onset  . Diabetes Mother   . Heart attack Mother   .  Stroke Mother   . Diabetes Sister   . Sleep apnea Sister   . Hypertension Brother   . Diabetes Brother   . Colon cancer Neg Hx   . Liver disease Neg Hx   . Diabetes Brother   . Hypertension Brother    History  Substance Use Topics  . Smoking status: Never Smoker   . Smokeless tobacco: Never Used  . Alcohol Use: No     Comment: heavy etoh use 30 years ago    Review of Systems  10 Systems reviewed and all are negative for acute change except as noted in the HPI.  Allergies  Hydrocodone; Lisinopril; Neurontin; Statins; and Metformin and related  Home Medications    Prior to Admission medications   Medication Sig Start Date End Date Taking? Authorizing Provider  ALPRAZolam Duanne Moron) 0.5 MG tablet Take 0.5-1 tablets (0.25-0.5 mg total) by mouth at bedtime as needed for sleep. 12/30/13  Yes [name], MD  aspirin EC 81 MG tablet Take 81 mg by mouth at bedtime.    Yes Historical Provider, MD  budesonide-formoterol (SYMBICORT) 160-4.5 MCG/ACT inhaler Inhale 2 puffs into the lungs 2 (two) times daily.    Yes Historical Provider, MD  clopidogrel (PLAVIX) 75 MG tablet Take 75 mg by mouth at bedtime.   Yes Historical Provider, MD  doxazosin (CARDURA) 4 MG tablet Take 2 mg by mouth at bedtime.  10/12/13  Yes Historical Provider, MD  fenofibrate 160 MG tablet Take 160 mg by mouth at bedtime.   Yes Historical Provider, MD  hydrALAZINE (APRESOLINE) 25 MG tablet Take 0.5 tablets (12.5 mg total) by mouth 2 (two) times daily. 11/24/13  Yes Herminio Commons, MD  Insulin Glargine (LANTUS SOLOSTAR) 100 UNIT/ML Solostar Pen Inject 60-80 Units into the skin at bedtime.    Yes Historical Provider, MD  metoprolol tartrate (LOPRESSOR) 25 MG tablet Take 1 tablet (25 mg total) by mouth 2 (two) times daily. 06/26/13  Yes Roger A Arguello, PA-C  nitroGLYCERIN (NITROSTAT) 0.4 MG SL tablet Place 0.4 mg under the tongue every 5 (five) minutes as needed for chest pain.   Yes Historical Provider, MD  valsartan (DIOVAN) 80 MG tablet Take 80 mg by mouth every morning.  10/09/13  Yes Historical Provider, MD  methylcellulose (ARTIFICIAL TEARS) 1 % ophthalmic solution Place 2 drops into the left eye 2 (two) times daily as needed (dry eye).    Historical Provider, MD  potassium chloride SA (K-DUR,KLOR-CON) 20 MEQ tablet Take 1 tablet (20 mEq total) by mouth daily. 01/27/14   Kathie Dike, MD  torsemide (DEMADEX) 20 MG tablet Take 0.5-1 tablets (10-20 mg total) by mouth 2 (two) times daily. Take 1 tab po qam and 0.5 tab po qpm 01/27/14   Kathie Dike, MD   Triage vitals: BP 179/84  Pulse 83   Temp(Src) 98.4 F (36.9 C) (Oral)  Resp 20  Ht 6\' 1"  (1.854 m)  Wt 255 lb (115.667 kg)  BMI 33.65 kg/m2  SpO2 99%  Physical Exam  Nursing note and vitals reviewed. Constitutional:  Awake, alert, nontoxic appearance.  HENT:  Head: Atraumatic.  Eyes: Right eye exhibits no discharge. Left eye exhibits no discharge.  Neck: Neck supple.  Cardiovascular: Normal rate, regular rhythm and normal heart sounds.   No murmur heard. Pulmonary/Chest: Effort normal. He has wheezes. He has no rales. He exhibits no tenderness.  Bibasilar crackles. Speech normal. Pulse ox normal room air. Few faint scattered end expiratory wheezes. No retractions no accessory muscle  usage.  Abdominal: Soft. There is no tenderness. There is no rebound.  Musculoskeletal: He exhibits edema. He exhibits no tenderness.  Baseline ROM, no obvious new focal weakness. mild edema both lower legs w/ cap refill less than 2 seconds feet. baseline neuropathy numbness to both feet  Neurological: He is alert.  Mental status and motor strength appears baseline for patient and situation.  Skin: No rash noted.  Psychiatric: He has a normal mood and affect.    ED Course  Procedures (including critical care time) DIAGNOSTIC STUDIES: Oxygen Saturation is 99% on room air, normal by my interpretation.    COORDINATION OF CARE: At 6:06 PM: Patient understands and agrees with initial ED impression and plan with expectations set for ED visit. Patient voided less than 541ml urine in the ED after 80 mg Lasix; patient still feels significantly short of breath walking less than 5 feet in ED and prefers observation rather than discharged so [facility] hospitalist paged. [place] Reviewed  PRO B NATRIURETIC PEPTIDE - Abnormal; Notable for the following:    Pro B Natriuretic peptide (BNP) 678.1 (*)    All other components within normal limits  CBC WITH DIFFERENTIAL - Abnormal; Notable for the following:    RBC 3.54 (*)    Hemoglobin  11.0 (*)    HCT 31.3 (*)    All other components within normal limits  BASIC METABOLIC PANEL - Abnormal; Notable for the following:    Sodium 135 (*)    Glucose, Bld 348 (*)    BUN 37 (*)    Creatinine, Ser 1.40 (*)    GFR calc non Af Amer 54 (*)    GFR calc Af Amer 63 (*)    All other components within normal limits  HEMOGLOBIN A1C - Abnormal; Notable for the following:    Hemoglobin A1C 10.0 (*)    Mean Plasma Glucose 240 (*)    All other components within normal limits  LIPID PANEL - Abnormal; Notable for the following:    Cholesterol 279 (*)    Triglycerides 831 (*)    HDL 28 (*)    All other components within normal limits  GLUCOSE, CAPILLARY - Abnormal; Notable for the following:    Glucose-Capillary 292 (*)    All other components within normal limits  GLUCOSE, CAPILLARY - Abnormal; Notable for the following:    Glucose-Capillary 222 (*)    All other components within normal limits  GLUCOSE, CAPILLARY - Abnormal; Notable for the following:    Glucose-Capillary 230 (*)    All other components within normal limits  BASIC METABOLIC PANEL - Abnormal; Notable for the following:    Sodium 134 (*)    Chloride 94 (*)    Glucose, Bld 250 (*)    BUN 44 (*)    Creatinine, Ser 1.69 (*)    GFR calc non Af Amer 43 (*)    GFR calc Af Amer 50 (*)    All other components within normal limits  GLUCOSE, CAPILLARY - Abnormal; Notable for the following:    Glucose-Capillary 319 (*)    All other components within normal limits  GLUCOSE, CAPILLARY - Abnormal; Notable for the following:    Glucose-Capillary 352 (*)    All other components within normal limits  GLUCOSE, CAPILLARY - Abnormal; Notable for the following:    Glucose-Capillary 272 (*)    All other components within normal limits  GLUCOSE, CAPILLARY - Abnormal; Notable for the following:    Glucose-Capillary 220 (*)  All other components within normal limits  GLUCOSE, CAPILLARY - Abnormal; Notable for the following:     Glucose-Capillary 293 (*)    All other components within normal limits  TROPONIN I  TSH  MAGNESIUM  TROPONIN I  TROPONIN I  TROPONIN I    Imaging Review No results found.   EKG Interpretation   Date/Time:  Monday January 25 2014 20:45:57 EDT Ventricular Rate:  83 PR Interval:  184 QRS Duration: 94 QT Interval:  356 QTC Calculation: 418 R Axis:   -19 Text Interpretation:  Normal sinus rhythm Incomplete right bundle branch  block Inferior infarct , age undetermined Anterior infarct , age  undetermined Abnormal ECG inferior q waves more pronounced since last  tracing Confirmed by KNAPP  MD-J, JON [id number]) on 01/25/2014 8:52:57 PM      MDM   Final diagnoses:  Acute diastolic heart failure  Chronic renal insufficiency  Diabetes mellitus with hyperglycemia    I personally performed the services described in this documentation, which was scribed in my presence. The recorded information has been reviewed and is accurate.     Babette Relic, MD 01/28/14 [phone]

## 2014-01-25 NOTE — ED Notes (Signed)
Pt states about an hour ago, he felt some tightness in his chest. Pt also has COPD and has bilateral ankle swelling. Pt also c/o sob.

## 2014-01-26 ENCOUNTER — Encounter (HOSPITAL_COMMUNITY): Payer: Self-pay | Admitting: Internal Medicine

## 2014-01-26 DIAGNOSIS — N189 Chronic kidney disease, unspecified: Secondary | ICD-10-CM

## 2014-01-26 DIAGNOSIS — R079 Chest pain, unspecified: Secondary | ICD-10-CM

## 2014-01-26 DIAGNOSIS — E119 Type 2 diabetes mellitus without complications: Secondary | ICD-10-CM

## 2014-01-26 DIAGNOSIS — I7090 Unspecified atherosclerosis: Secondary | ICD-10-CM

## 2014-01-26 DIAGNOSIS — I252 Old myocardial infarction: Secondary | ICD-10-CM

## 2014-01-26 DIAGNOSIS — R633 Feeding difficulties, unspecified: Secondary | ICD-10-CM

## 2014-01-26 DIAGNOSIS — G4733 Obstructive sleep apnea (adult) (pediatric): Secondary | ICD-10-CM

## 2014-01-26 DIAGNOSIS — D649 Anemia, unspecified: Secondary | ICD-10-CM

## 2014-01-26 DIAGNOSIS — N183 Chronic kidney disease, stage 3 unspecified: Secondary | ICD-10-CM

## 2014-01-26 DIAGNOSIS — I10 Essential (primary) hypertension: Secondary | ICD-10-CM

## 2014-01-26 DIAGNOSIS — R609 Edema, unspecified: Secondary | ICD-10-CM

## 2014-01-26 DIAGNOSIS — N179 Acute kidney failure, unspecified: Secondary | ICD-10-CM | POA: Diagnosis present

## 2014-01-26 DIAGNOSIS — R0989 Other specified symptoms and signs involving the circulatory and respiratory systems: Secondary | ICD-10-CM

## 2014-01-26 DIAGNOSIS — R0609 Other forms of dyspnea: Secondary | ICD-10-CM

## 2014-01-26 DIAGNOSIS — E785 Hyperlipidemia, unspecified: Secondary | ICD-10-CM

## 2014-01-26 DIAGNOSIS — I5031 Acute diastolic (congestive) heart failure: Secondary | ICD-10-CM

## 2014-01-26 DIAGNOSIS — I5033 Acute on chronic diastolic (congestive) heart failure: Principal | ICD-10-CM | POA: Diagnosis present

## 2014-01-26 DIAGNOSIS — K219 Gastro-esophageal reflux disease without esophagitis: Secondary | ICD-10-CM

## 2014-01-26 DIAGNOSIS — I2510 Atherosclerotic heart disease of native coronary artery without angina pectoris: Secondary | ICD-10-CM

## 2014-01-26 LAB — LIPID PANEL
CHOL/HDL RATIO: 10 ratio
Cholesterol: 279 mg/dL — ABNORMAL HIGH (ref 0–200)
HDL: 28 mg/dL — ABNORMAL LOW (ref >39)
LDL Cholesterol: UNDETERMINED mg/dL (ref 0–99)
Triglycerides: 831 mg/dL — ABNORMAL HIGH (ref <150)
VLDL: UNDETERMINED mg/dL (ref 0–40)

## 2014-01-26 LAB — GLUCOSE, CAPILLARY
GLUCOSE-CAPILLARY: 292 mg/dL — AB (ref 70–99)
Glucose-Capillary: 222 mg/dL — ABNORMAL HIGH (ref 70–99)
Glucose-Capillary: 230 mg/dL — ABNORMAL HIGH (ref 70–99)
Glucose-Capillary: 319 mg/dL — ABNORMAL HIGH (ref 70–99)
Glucose-Capillary: 352 mg/dL — ABNORMAL HIGH (ref 70–99)

## 2014-01-26 LAB — TROPONIN I: Troponin I: <0.3 ng/mL (ref <0.30)

## 2014-01-26 LAB — HEMOGLOBIN A1C
HEMOGLOBIN A1C: 10 % — AB (ref <5.7)
MEAN PLASMA GLUCOSE: 240 mg/dL — AB (ref <117)

## 2014-01-26 LAB — MAGNESIUM: Magnesium: 1.9 mg/dL (ref 1.5–2.5)

## 2014-01-26 LAB — TSH: TSH: 3.14 u[IU]/mL (ref 0.350–4.500)

## 2014-01-26 MED ORDER — INSULIN ASPART 100 UNIT/ML ~~LOC~~ SOLN
0.0000 [IU] | Freq: Three times a day (TID) | SUBCUTANEOUS | Status: DC
  Administered 2014-01-26 (×2): 5 [IU] via SUBCUTANEOUS
  Administered 2014-01-26 – 2014-01-27 (×2): 8 [IU] via SUBCUTANEOUS
  Administered 2014-01-27: 5 [IU] via SUBCUTANEOUS

## 2014-01-26 MED ORDER — BUDESONIDE-FORMOTEROL FUMARATE 160-4.5 MCG/ACT IN AERO
2.0000 | INHALATION_SPRAY | Freq: Two times a day (BID) | RESPIRATORY_TRACT | Status: DC
  Administered 2014-01-26 – 2014-01-27 (×2): 2 via RESPIRATORY_TRACT
  Filled 2014-01-26: qty 6

## 2014-01-26 MED ORDER — HEPARIN SODIUM (PORCINE) 5000 UNIT/ML IJ SOLN
5000.0000 [IU] | Freq: Three times a day (TID) | INTRAMUSCULAR | Status: DC
  Administered 2014-01-26 – 2014-01-27 (×4): 5000 [IU] via SUBCUTANEOUS
  Filled 2014-01-26 (×5): qty 1

## 2014-01-26 MED ORDER — METHYLCELLULOSE 1 % OP SOLN: 2.0000 [drp] | Freq: Two times a day (BID) | OPHTHALMIC | Status: DC | PRN

## 2014-01-26 MED ORDER — SODIUM CHLORIDE 0.9 % IV SOLN: 250.0000 mL | INTRAVENOUS | Status: DC | PRN

## 2014-01-26 MED ORDER — FUROSEMIDE 10 MG/ML IJ SOLN
60.0000 mg | Freq: Two times a day (BID) | INTRAMUSCULAR | Status: DC
Start: 2014-01-26 — End: 2014-01-27
  Administered 2014-01-26 – 2014-01-27 (×3): 60 mg via INTRAVENOUS
  Filled 2014-01-26 (×4): qty 6

## 2014-01-26 MED ORDER — INSULIN GLARGINE 100 UNIT/ML ~~LOC~~ SOLN
45.0000 [IU] | Freq: Every day | SUBCUTANEOUS | Status: DC
  Administered 2014-01-26 – 2014-01-27 (×2): 45 [IU] via SUBCUTANEOUS
  Filled 2014-01-26 (×3): qty 0.45

## 2014-01-26 MED ORDER — SODIUM CHLORIDE 0.9 % IJ SOLN
3.0000 mL | Freq: Two times a day (BID) | INTRAMUSCULAR | Status: DC
  Administered 2014-01-26 (×3): 3 mL via INTRAVENOUS

## 2014-01-26 MED ORDER — SODIUM CHLORIDE 0.9 % IJ SOLN: 3.0000 mL | INTRAMUSCULAR | Status: DC | PRN

## 2014-01-26 MED ORDER — DOXAZOSIN MESYLATE 2 MG PO TABS
2.0000 mg | ORAL_TABLET | Freq: Every day | ORAL | Status: DC
  Administered 2014-01-26: 2 mg via ORAL
  Filled 2014-01-26: qty 1

## 2014-01-26 MED ORDER — POLYVINYL ALCOHOL 1.4 % OP SOLN: 2.0000 [drp] | Freq: Two times a day (BID) | OPHTHALMIC | Status: DC | PRN

## 2014-01-26 MED ORDER — HYDRALAZINE HCL 25 MG PO TABS
12.5000 mg | ORAL_TABLET | Freq: Two times a day (BID) | ORAL | Status: DC
  Administered 2014-01-26 – 2014-01-27 (×4): 12.5 mg via ORAL
  Filled 2014-01-26 (×4): qty 1

## 2014-01-26 MED ORDER — ALPRAZOLAM 0.25 MG PO TABS: 0.2500 mg | ORAL_TABLET | Freq: Every evening | ORAL | Status: DC | PRN

## 2014-01-26 MED ORDER — INSULIN GLARGINE 100 UNIT/ML ~~LOC~~ SOLN
45.0000 [IU] | Freq: Every day | SUBCUTANEOUS | Status: DC
  Filled 2014-01-26: qty 0.45

## 2014-01-26 MED ORDER — LIVING BETTER WITH HEART FAILURE BOOK
Freq: Once | Status: DC
  Filled 2014-01-26: qty 1

## 2014-01-26 MED ORDER — METOPROLOL TARTRATE 25 MG PO TABS
25.0000 mg | ORAL_TABLET | Freq: Two times a day (BID) | ORAL | Status: DC
  Administered 2014-01-26 – 2014-01-27 (×4): 25 mg via ORAL
  Filled 2014-01-26 (×4): qty 1

## 2014-01-26 MED ORDER — FENOFIBRATE 160 MG PO TABS
160.0000 mg | ORAL_TABLET | Freq: Every day | ORAL | Status: DC
  Administered 2014-01-26: 160 mg via ORAL
  Filled 2014-01-26 (×3): qty 1

## 2014-01-26 MED ORDER — CLOPIDOGREL BISULFATE 75 MG PO TABS
75.0000 mg | ORAL_TABLET | Freq: Every day | ORAL | Status: DC
  Administered 2014-01-26: 75 mg via ORAL
  Filled 2014-01-26: qty 1

## 2014-01-26 MED ORDER — PANTOPRAZOLE SODIUM 40 MG PO TBEC
40.0000 mg | DELAYED_RELEASE_TABLET | Freq: Every day | ORAL | Status: DC
  Administered 2014-01-26 – 2014-01-27 (×2): 40 mg via ORAL
  Filled 2014-01-26 (×2): qty 1

## 2014-01-26 MED ORDER — IRBESARTAN 75 MG PO TABS
37.5000 mg | ORAL_TABLET | Freq: Every day | ORAL | Status: DC
  Administered 2014-01-26 – 2014-01-27 (×2): 37.5 mg via ORAL
  Filled 2014-01-26 (×2): qty 1

## 2014-01-26 MED ORDER — ASPIRIN EC 81 MG PO TBEC
81.0000 mg | DELAYED_RELEASE_TABLET | Freq: Every day | ORAL | Status: DC
  Administered 2014-01-26: 81 mg via ORAL
  Filled 2014-01-26: qty 1

## 2014-01-26 MED ORDER — POTASSIUM CHLORIDE CRYS ER 20 MEQ PO TBCR
20.0000 meq | EXTENDED_RELEASE_TABLET | Freq: Every day | ORAL | Status: DC
  Administered 2014-01-26 – 2014-01-27 (×2): 20 meq via ORAL
  Filled 2014-01-26 (×2): qty 1

## 2014-01-26 MED ORDER — ACETAMINOPHEN 325 MG PO TABS
650.0000 mg | ORAL_TABLET | Freq: Four times a day (QID) | ORAL | Status: DC | PRN
  Administered 2014-01-26: 650 mg via ORAL
  Filled 2014-01-26: qty 2

## 2014-01-26 NOTE — Plan of Care (Signed)
Problem: Consults Goal: Nutrition Consult-if indicated Outcome: Progressing Educated patient on low sodium diet.

## 2014-01-26 NOTE — Consult Note (Signed)
CARDIOLOGY CONSULT NOTE   Patient ID: Eugene [name] MRN: [id number] DOB/AGE: 01-07-1956 58 y.o.  Admit Date: 01/25/2014 Referring Physician: PTH Primary Physician: [name], MD Consulting Cardiologist: Kate Sable MD Primary Cardiologist: Kate Sable MD Reason for Consultation: CHF  Clinical Summary Eugene [name] is a 57 y.o.male with CAD with prior RCA stent in 2014, pericardial effusion s/p window, DM, HTN, hyperlipidemia, COPD, OSA, and diastolic heart failure. He has a history of STEMI in Jan 2014. Cath showed LM patent, LAD 50% proximal, 50% D1, LCX patent, RCA 30-40% in previously placed stent and 100% distal occlusion. RCA received a DES.   He came to ER with complaints of increased dyspnea and edema. HE states that he gained 7 lbs in 4 days.  He admits to eating salty foods, eating out a lot at [employer] for lunch daily. He admits to not adhering to diabetic diet, He has been gaining wt per his scale. On the 4 th day, he started taking the diuretic BID, with no wt loss. Breathing became worse night of admission with dyspnea at rest and legs becoming very heavy. He denies medical non-compliance.   On arrival to ER, BP 179/84, HR 83 bpm, Resp 20, O2 sat 99%. Pro-BNP 678.1, Hgb 11.0/Hct 31.3, BG 348, BUN 37, Creat. 1.40.  CXR mild cardiomegaly and central pulmonary vasculature congestion. He was given 80 mg./NTG paste. He has diuresed  900 cc since admission. Wt on last discharge in April 258 lbs, admission wt 263 at highest recorded.   Recently discharged in April of 2015 with COPD exacerbation with complaints of dyspnea. Eugene [name] was completed in April which was negative for ischemia. Demedex was decreased to 20 mg daily, during  that admission due to increased creatinine of 2.22.    Allergies  Allergen Reactions  . Hydrocodone Nausea And Vomiting  . Lisinopril Cough  . Neurontin [Gabapentin]     Side effects, suicidal thoughts  . Statins Other (See  Comments)    Muscle aches  . Metformin And Related     Intestinal side effects    Medications Scheduled Medications: . aspirin EC  81 mg Oral QHS  . budesonide-formoterol  2 puff Inhalation BID  . clopidogrel  75 mg Oral QHS  . doxazosin  2 mg Oral QHS  . fenofibrate  160 mg Oral QHS  . furosemide  60 mg Intravenous Q12H  . heparin  5,000 Units Subcutaneous 3 times per day  . hydrALAZINE  12.5 mg Oral BID  . insulin aspart  0-15 Units Subcutaneous TID WC  . insulin glargine  45 Units Subcutaneous QHS  . irbesartan  37.5 mg Oral Daily  . Living Better with Heart Failure Book   Does not apply Once  . metoprolol tartrate  25 mg Oral BID  . pantoprazole  40 mg Oral Daily  . potassium chloride  20 mEq Oral Daily  . sodium chloride  3 mL Intravenous Q12H          PRN Medications:  sodium chloride, acetaminophen, ALPRAZolam, nitroGLYCERIN, polyvinyl alcohol, sodium chloride   Past Medical History  Diagnosis Date  . Arteriosclerotic cardiovascular disease (ASCVD)     a. 05/2011 Cath/PCI: LM nl, LAD 16m, D1 small, D2 small 43m, LCX large 40p, RCA 50-60p, 99 hazy @ origin of PDA with 70-80 in PDA (2.5x26 Resolute Integrity & 3.0x15 Resolute Integrity DES).;  b. 08/2012 Inflat  STEMI/Cath/PCI: LM minor irregs, LAD 50p, D1 50, LCX nl, OM1 25, RCA 30-40p, 100d (treated  with 2.75x59mm Promus Premier DES);  c. 08/2012 Echo: EF 55-60%, basal inferopost HK.  [name] Hyperlipidemia   . Diabetes mellitus     Peripheral neuropathy  . Bell palsy   . Hypertension   . COPD (chronic obstructive pulmonary disease)   . Sleep apnea   . Gallstones   . Cholelithiasis 07/2012    Asymptomatic; identified incidentally  . PONV (postoperative nausea and vomiting)   . Anxiety   . C. difficile colitis     a. 08/2012  . Myocardial infarct 09/08/12  . Nephrolithiasis   . Contrast dye induced nephropathy     a. 08/2012 post cath/pci  . CHF (congestive heart failure)   . Asthma   . Heart disease   . Old  myocardial infarction   . Anginal pain   . GERD (gastroesophageal reflux disease)     Past Surgical History  Procedure Laterality Date  . Circumcision    . Stents    . Esophagogastroduodenoscopy      in [place] over 20 yrs ago  . Colonoscopy      In [facility], approximately 2011 per patient, was normal. Advised to come back in 10 years.  . Esophagogastroduodenoscopy  06/12/2012    Procedure: ESOPHAGOGASTRODUODENOSCOPY (EGD);  Surgeon: Daneil Dolin, MD;  [facility];  Service: Endoscopy;  Laterality: N/A;  9:45  . Cardiac catheterization    . Subxyphoid pericardial window N/A 06/23/2013    Procedure: SUBXYPHOID PERICARDIAL WINDOW;  Surgeon: Grace Isaac, MD;  Location: [place];  Service: Thoracic;  Laterality: N/A;  . Intraoperative transesophageal echocardiogram N/A 06/23/2013    Procedure: INTRAOPERATIVE TRANSESOPHAGEAL ECHOCARDIOGRAM;  Surgeon: Grace Isaac, MD;  Location: [place];  Service: Open Heart Surgery;  Laterality: N/A;    Family History  Problem Relation Age of Onset  . Diabetes Mother   . Heart attack Mother   . Stroke Mother   . Diabetes Sister   . Sleep apnea Sister   . Hypertension Brother   . Diabetes Brother   . Colon cancer Neg Hx   . Liver disease Neg Hx   . Diabetes Brother   . Hypertension Brother     Social History [name] reports that he has never smoked. He has never used smokeless tobacco. [name] reports that he does not drink alcohol.  Review of Systems Otherwise reviewed and negative except as outlined.  Physical Examination Blood pressure 155/84, pulse 67, temperature 98.3 F (36.8 C), temperature source Oral, resp. rate 20, height 6\' 1"  (1.854 m), weight 263 lb 14.3 oz (119.7 kg), SpO2 95.00%.  Intake/Output Summary (Last 24 hours) at 01/26/14 0855 Last data filed at 01/26/14 0552  Gross per 24 hour  Intake      0 ml  Output    900 ml  Net   -900 ml    Telemetry:NSR with  RBBB.  GEN: HEENT: Conjunctiva and lids normal, oropharynx clear with moist mucosa. Neck: Supple, no elevated JVP or carotid bruits, no thyromegaly. Lungs: Clear to auscultation, nonlabored breathing at rest. Cardiac: Regular rate and rhythm, no S3 or significant systolic murmur, no pericardial rub. Abdomen: Soft, nontender, no hepatomegaly, bowel sounds present, no guarding or rebound. Extremities: No pitting edema, distal pulses 2+. Skin: Warm and dry. Musculoskeletal: No kyphosis. Neuropsychiatric: Alert and oriented x3, affect grossly appropriate.   Prior Cardiac Testing/Procedures  1. Echocardiogram Left ventricle: The cavity size was normal. Wall thickness was increased in a pattern of severe LVH. Systolic function  was normal. The estimated ejection fraction was in the range of 60% to 65%. Wall motion was normal; there were no regional wall motion abnormalities. - Aortic valve: Trileaflet; mildly thickened leaflets. - Mitral valve: Calcified annulus. Mildly thickened leaflets . - Left atrium: The atrium was mildly dilated. - Pericardium, extracardiac: Small to medium size pericardial effusion (1.69 cm in maximal diameter). No evidence of tamponade physiology.  2. NM Stress Test 12/14/2013  1. No evidence for inducible ischemia. 2. Remote infarct involving the anterior inferior and septal wall. 3. Focal hypokinesis associated with the area of remote infarct. 4. Preserved cardiac function with an estimated ejection fraction of 60%.  3. Cardiac Cath 08/2013 [facility] FINDINGS  Hemodynamics:  AO 138/80  LV 136/26  Coronary angiography:  Coronary dominance: right  Left mainstem: Patent with minor irregularity. No significant stenosis  Left anterior descending (LAD): Diffuse disease in the proximal and mid vessel but no severe stenoses present. 50% proximal LAD stenosis, 50% diagonal stenosis (D1), diffuse distal vessel disease  Left circumflex (LCx): Widely patent, very  large vessel. OM1 is large with mild nonobstructive disease 25% stenosis.  Right coronary artery (RCA): Proximal vessel 30-40% stenosis within previously implanted stent. Distal vessel 100% occluded prior to stented segment in PDA  Left ventriculography: Left ventricular systolic function is Moderately depressed with severe hypokinesis of the inferior wall. The LVEF is estimated at 45%.   PCI Note: Following the diagnostic procedure, the decision was made to proceed with PCI. Weight-based bivalirudin was given for anticoagulation. Effient 60 mg was administered after excluding any contraindications. Once a therapeutic ACT was achieved, a 6 [place] JR4 guide catheter was inserted. A prowater coronary guidewire was used to cross the lesion. The initial occlusion was moderately difficult to cross and the post AV segment was wired. The occlusion was dilated with a 2.0 mm balloon but flow was not restored. The wire was then directed across the stented segment of PDA and the stent was dilated. This restored TIMI-3 flow. The lesion was then redilated with a 2.5x30 mm balloon. The lesion was then stented with a 2.75x38 mm Promus Premier drug-eluting stent which was carefully positioned in the distal RCA extending into the proximal stented segment within the PDA. The stent was postdilated with a 3.0 mm noncompliant balloon. Following PCI, there was 0% residual stenosis and TIMI-3 flow. Final angiography confirmed an excellent result.    Lab Results  Basic Metabolic Panel:  Recent Labs Lab 01/25/14 2143 01/26/14 0238  NA 135*  --   K 4.9  --   CL 96  --   CO2 27  --   GLUCOSE 348*  --   BUN 37*  --   CREATININE 1.40*  --   CALCIUM 9.2  --   MG  --  1.9    CBC:  Recent Labs Lab 01/25/14 2143  WBC 6.1  NEUTROABS 3.7  HGB 11.0*  HCT 31.3*  MCV 88.4  PLT 190    Cardiac Enzymes:  Recent Labs Lab 01/25/14 2143 01/26/14 0238 01/26/14 0608  TROPONINI <0.30 <0.30 <0.30    BNP: 678.1     Radiology: Dg Chest 2 View  01/25/2014   CLINICAL DATA:  Chest pain and shortness of breath.  EXAM: CHEST  2 VIEW  COMPARISON:  Chest radiograph December 13, 2013.  FINDINGS: The cardiac silhouette appears mildly enlarged, similar. Mild central pulmonary vasculature congestion in this low inspiratory examination without pleural effusions. Minimal bibasilar strandy densities. No pneumothorax.  Soft tissue  planes and included osseous structures are nonsuspicious. Mild degenerative change of thoracic spine.  IMPRESSION: Mild cardiomegaly and central pulmonary vasculature congestion with minimal bibasilar atelectasis.   Electronically Signed   By: Elon Alas   On: 01/25/2014 23:02     ECG: NSR with RBBB, infero/antero infarct.   Impression and Recommendations  1.Acute on Chronic Diastolic CHF: Secondary to dietary non-compliance with salty foods. He was decreased on torsemide to 20 mg daily due to increased creatinine in April, and did well up until recently when he began fluid build up one week ago. Will continue IV diuretics to wt of 258 lbs or less. Watch creatinine. Will not repeat echo at this time unless diuresis is not adequate. Strict I/O and daily wts.   2.CAD: Recent PCI to the RCA in April of 2015, with non-obstructive disease elsewhere per cath. He remains on Plavix and ASA, metoprolol 25 mg BID. NO acute EKG changes are noted. Troponin's are negative X4 arguing against ACS as reason for decompensation and symptoms of dyspnea.  3. Hypertension: Blood pressure is better controlled with losartan, and nitro paste. On hydralazine 12.5 mg BID at home. Will restart this, and d/c nitro to plan for discharge in a day or two once euvolemic.  4. Diabetes; Not well controlled at home. Per PTH to follow.     Signed: Phill Myron. Osa Campoli NP  01/26/2014, 8:55 AM Co-Sign MD

## 2014-01-26 NOTE — Care Management Note (Addendum)
    Page 1 of 2   01/28/2014     9:31:40 AM CARE MANAGEMENT NOTE 01/28/2014  Patient:  Eugene [name], Eugene [name]   Account Number:  [id number]  Date Initiated:  01/26/2014  Documentation initiated by:  Theophilus Kinds  Subjective/Objective Assessment:   Pt admitted from home with CHF. Pt lives with his wife and will return home at discharge. Pt is independent with ADL's. Pt has a set of scales and stated that a RN with [employer] calls him regularly.     Action/Plan:   No CM needs noted.   Anticipated DC Date:  01/28/2014   Anticipated DC Plan:  [place]  CM consult      [facility] Choice  HOME HEALTH   Choice offered to / List presented to:  C-1 Patient        [place] arranged  HH-1 RN      [place].   Status of service:  Completed, signed off Medicare Important Message given?   (If response is "NO", the following Medicare IM given date fields will be blank) Date Medicare IM given:   Date Additional Medicare IM given:    Discharge Disposition:  HOME/SELF CARE  Per UR Regulation:    If discussed at Long Length of Stay Meetings, dates discussed:    Comments:  01/28/14 0915 Christinia Gully, RN BSN [facility] CM spoke with pt by phone to arrange [facility]. Pt choose [facility] for RN services. Romualdo Bolk of [facility] is aware and will collect the pts information from the chart. Pt [place] services to start within 48 hours of discharge. Pt aware of discharge arrangements. 01/27/14 [place], RN BSN CM Pt discharged home today. Pt ordered [facility] RN but pt was discharged before it could be arranged. WIll call pt at home and arrange with agency of his choice.  01/26/14 [place], RN BSN CM

## 2014-01-26 NOTE — Progress Notes (Signed)
I have seen and assessed patient and agree with Dr Anson Fret assessment and plan. Consult with cardiology. Continue diureses and follow.

## 2014-01-26 NOTE — H&P (Signed)
[facility] History and Physical  Eugene [name] [id number] DOB: 1955-11-28 DOA: 01/25/2014  Referring physician: Dr. Stevie Kern. PCP: Eugene Lange, MD   Chief Complaint: Increase swelling, shortness of breath  HPI: Eugene [name] is a 58 y.o. male with a past medical history significant for coronary artery disease, hyperlipidemia, diabetes mellitus, hypertension, asthma/COPD, anxiety and GERD; presented to the emergency department complaining of chest discomfort, increased lower extremity swelling and shortness of breath. Patient describes that his symptoms has been resting and steadily worsening for the last 3 days prior to admission. Patient reports recent adjustment done to his diuretics secondary to worsening of chronic kidney function, since then patient has been noticing increased weight, increased swelling of his legs, shortness of breath (especially on exertion) and also for orthopnea. In the emergency department patient has elevated BNP, chest x-ray demonstrating vascular congestion, positive orthopnea and normal troponin X2. [facility] has been called to admit the patient for further evaluation and treatment.   Review of Systems:  Negative except as otherwise mentioned on history of present illness.  Past Medical History  Diagnosis Date  . Arteriosclerotic cardiovascular disease (ASCVD)     a. 05/2011 Cath/PCI: LM nl, LAD 42m, D1 small, D2 small 88m, LCX large 40p, RCA 50-60p, 99 hazy @ origin of PDA with 70-80 in PDA (2.5x26 Resolute Integrity & 3.0x15 Resolute Integrity DES).;  b. 08/2012 Inflat  STEMI/Cath/PCI: LM minor irregs, LAD 50p, D1 50, LCX nl, OM1 25, RCA 30-40p, 100d (treated with 2.75x79mm Promus Premier DES);  c. 08/2012 Echo: EF 55-60%, basal inferopost HK.  [name] Hyperlipidemia   . Diabetes mellitus     Peripheral neuropathy  . Bell palsy   . Hypertension   . COPD (chronic obstructive pulmonary disease)   . Sleep apnea   . Gallstones   .  Cholelithiasis 07/2012    Asymptomatic; identified incidentally  . PONV (postoperative nausea and vomiting)   . Anxiety   . C. difficile colitis     a. 08/2012  . Myocardial infarct 09/08/12  . Nephrolithiasis   . Contrast dye induced nephropathy     a. 08/2012 post cath/pci  . CHF (congestive heart failure)   . Asthma   . Heart disease   . Old myocardial infarction   . Anginal pain   . GERD (gastroesophageal reflux disease)    Past Surgical History  Procedure Laterality Date  . Circumcision    . Stents    . Esophagogastroduodenoscopy      in [place] over 20 yrs ago  . Colonoscopy      In [facility], approximately 2011 per patient, was normal. Advised to come back in 10 years.  . Esophagogastroduodenoscopy  06/12/2012    Procedure: ESOPHAGOGASTRODUODENOSCOPY (EGD);  Surgeon: Eugene Dolin, MD;  [facility];  Service: Endoscopy;  Laterality: N/A;  9:45  . Cardiac catheterization    . Subxyphoid pericardial window N/A 06/23/2013    Procedure: SUBXYPHOID PERICARDIAL WINDOW;  Surgeon: Eugene Isaac, MD;  Location: [place];  Service: Thoracic;  Laterality: N/A;  . Intraoperative transesophageal echocardiogram N/A 06/23/2013    Procedure: INTRAOPERATIVE TRANSESOPHAGEAL ECHOCARDIOGRAM;  Surgeon: Eugene Isaac, MD;  Location: [place];  Service: Open Heart Surgery;  Laterality: N/A;   Social History:  reports that he has never smoked. He has never used smokeless tobacco. He reports that he does not drink alcohol or use illicit drugs.  Allergies  Allergen Reactions  . Hydrocodone Nausea And Vomiting  . Lisinopril Cough  .  Neurontin [Gabapentin]     Side effects, suicidal thoughts  . Statins Other (See Comments)    Muscle aches  . Metformin And Related     Intestinal side effects    Family History  Problem Relation Age of Onset  . Diabetes Mother   . Heart attack Mother   . Stroke Mother   . Diabetes Sister   . Sleep apnea Sister   .  Hypertension Brother   . Diabetes Brother   . Colon cancer Neg Hx   . Liver disease Neg Hx   . Diabetes Brother   . Hypertension Brother      Prior to Admission medications   Medication Sig Start Date End Date Taking? Authorizing Provider  ALPRAZolam Eugene [name]) 0.5 MG tablet Take 0.5-1 tablets (0.25-0.5 mg total) by mouth at bedtime as needed for sleep. 12/30/13  Yes [name], MD  aspirin EC 81 MG tablet Take 81 mg by mouth at bedtime.    Yes Historical Provider, MD  budesonide-formoterol (SYMBICORT) 160-4.5 MCG/ACT inhaler Inhale 2 puffs into the lungs 2 (two) times daily.    Yes Historical Provider, MD  clopidogrel (PLAVIX) 75 MG tablet Take 75 mg by mouth at bedtime.   Yes Historical Provider, MD  doxazosin (CARDURA) 4 MG tablet Take 2 mg by mouth at bedtime.  10/12/13  Yes Historical Provider, MD  fenofibrate 160 MG tablet Take 160 mg by mouth at bedtime.   Yes Historical Provider, MD  hydrALAZINE (APRESOLINE) 25 MG tablet Take 0.5 tablets (12.5 mg total) by mouth 2 (two) times daily. 11/24/13  Yes Eugene Commons, MD  Insulin Glargine (LANTUS SOLOSTAR) 100 UNIT/ML Solostar Pen Inject 60-80 Units into the skin at bedtime.    Yes Historical Provider, MD  metoprolol tartrate (LOPRESSOR) 25 MG tablet Take 1 tablet (25 mg total) by mouth 2 (two) times daily. 06/26/13  Yes Eugene A Arguello, PA-C  nitroGLYCERIN (NITROSTAT) 0.4 MG SL tablet Place 0.4 mg under the tongue every 5 (five) minutes as needed for chest pain.   Yes Historical Provider, MD  torsemide (DEMADEX) 20 MG tablet Take 1 tablet (20 mg total) by mouth daily. 12/14/13  Yes Eugene K Kilroy, PA-C  valsartan (DIOVAN) 80 MG tablet Take 80 mg by mouth every morning.  10/09/13  Yes Historical Provider, MD  methylcellulose (ARTIFICIAL TEARS) 1 % ophthalmic solution Place 2 drops into the left eye 2 (two) times daily as needed (dry eye).    Historical Provider, MD   Physical Exam: Filed Vitals:   01/26/14 0100  BP: 126/74  Pulse: 77    Temp: 98.3 F (36.8 C)  Resp: 20    BP 126/74  Pulse 77  Temp(Src) 98.3 F (36.8 C) (Oral)  Resp 20  Ht 6\' 1"  (1.854 m)  Wt 118.343 kg (260 lb 14.4 oz)  BMI 34.43 kg/m2  SpO2 96%  General:  Appears comfortable, afebrile, mild shortness of breath while laying flat on bed; otherwise able to speak in full sentences. Eyes: PERRL, normal lids, irises & conjunctiva, no icterus, no nystagmus; extraocular muscles intact ENT: grossly normal hearing, lips & tongue, moist mucous membranes, no erythema or exudates inside his mouth; no drainage out of his ears or nostrils Neck: no LAD, masses or thyromegaly, mild positive JVD. Cardiovascular: RRR, no rubs or gallops. 2+ LE edema. Telemetry: SR, no arrhythmias  Respiratory: positive bibasilar crackles, no wheezing Abdomen: soft, nt, nd, positive bowel sounds, no rebound or guarding Skin: no rash or induration seen on  limited exam Musculoskeletal: grossly normal tone BUE/BLE Psychiatric: grossly normal mood and affect, speech fluent and appropriate Neurologic: grossly non-focal.          Labs on Admission:  Basic Metabolic Panel:  Recent Labs Lab 01/25/14 2143  NA 135*  K 4.9  CL 96  CO2 27  GLUCOSE 348*  BUN 37*  CREATININE 1.40*  CALCIUM 9.2   CBC:  Recent Labs Lab 01/25/14 2143  WBC 6.1  NEUTROABS 3.7  HGB 11.0*  HCT 31.3*  MCV 88.4  PLT 190   Cardiac Enzymes:  Recent Labs Lab 01/25/14 2143  TROPONINI <0.30    BNP (last 3 results)  Recent Labs  09/12/13 1752 10/15/13 2015 01/25/14 2143  PROBNP 213.9* 221.3* 678.1*   Radiological Exams on Admission: Dg Chest 2 View  01/25/2014   CLINICAL DATA:  Chest pain and shortness of breath.  EXAM: CHEST  2 VIEW  COMPARISON:  Chest radiograph December 13, 2013.  FINDINGS: The cardiac silhouette appears mildly enlarged, similar. Mild central pulmonary vasculature congestion in this low inspiratory examination without pleural effusions. Minimal bibasilar strandy  densities. No pneumothorax.  Soft tissue planes and included osseous structures are nonsuspicious. Mild degenerative change of thoracic spine.  IMPRESSION: Mild cardiomegaly and central pulmonary vasculature congestion with minimal bibasilar atelectasis.   Electronically Signed   By: Elon Alas   On: 01/25/2014 23:02    EKG:  Normal sinus rhythm, Q waves in anterior leads appreciated, positive incomplete right bundle-branch block.   Assessment/Plan 1-shortness of breath and orthopnea secondary to Acute on chronic diastolic heart failure: -Patient will be admitted to telemetry bed -Will start IV Lasix -Daily weights, strict intake and output -Beta blockers and AFB are already on board (dose has been cut in half to allow diuresis and decrease chances of affecting kidney function)  -and heart healthy low-sodium diet. -Will follow clinical response. Patient might require cardiology evaluation while inpatient for further recommendations on treatment for his diastolic heart failure.   2-diabetes: Will check hemoglobin A1c. Continue Sliding scale insulin  3-OSA: Continue CPAP  4-hypertension: Stable currently. Will start low sodium diet and follow closely as patient will be on IV diuresis and his metoprolol and ARB doses has been cut in half.  5-hyperlipidemia: Will continue fenofibrate and will check a lipid profile.  7-BPH: Continue Cardura  8-Asthma: Currently w/o any wheezing and well controlled. Will continue Symbicort.  9-chest pain: Appears to be most likely secondary to CHF exacerbation and GERD -Given her risk factors will cycle cardiac enzymes and EKG Patient will be admitted to telemetry -Will continue aspirin, Plavix and metoprolol -Will start Protonix 40 mg by mouth daily  DVT: Heparin    Code Status: Full Family Communication: No family at bedside. Disposition Plan: inpatient, LOS > 2 midnights, telemetry  Time spent: 55 minutes  Barton Dubois [facility] Pager [phone]  **Disclaimer: This note may have been dictated with voice recognition software. Similar sounding words can inadvertently be transcribed and this note may contain transcription errors which may not have been corrected upon publication of note.**

## 2014-01-26 NOTE — Progress Notes (Signed)
UR chart review completed.  

## 2014-01-26 NOTE — Consult Note (Signed)
The patient was seen and examined, and I agree with the assessment and plan as documented above, with modifications as noted below. Pt well known to me admitted with acute on chronic diastolic heart failure, from what appears to be dietary indiscretion. BNP only mildly elevated at 678. ECG unchanged from April. He admits to knowing "very little about a low salt diet". He has been eating "a bowl of beans" at [facility] on a daily basis. Avoids canned foods and eats frozen vegetables. He is compliant with meds. Denies chest pain. Does feel better since admission. Currently on IV Lasix 60 mg bid, which can be increased in frequency if need be. He has been on torsemide 20 mg daily, which may need to be increased to 20 mg q am and 10 mg q pm. I will also place a consult to the dietitian for more comprehensive dietary education. No cardiovascular testing is indicated at this time.

## 2014-01-26 NOTE — Plan of Care (Signed)
Problem: Food- and Nutrition-Related Knowledge Deficit (NB-1.1) Goal: Nutrition education Formal process to instruct or train a patient/client in a skill or to impart knowledge to help patients/clients voluntarily manage or modify food choices and eating behavior to maintain or improve health. Outcome: Adequate for Discharge Nutrition Education Note  RD consulted for nutrition education regarding new onset CHF.  RD provided "Low Sodium Nutrition Therapy", and "Low Sodium Seasoning Alternatives" handouts from the Academy of [employer] and Dietetics. Reviewed patient's dietary recall. Provided examples on ways to decrease sodium intake in diet. Discouraged intake of processed foods and use of salt shaker. Encouraged fresh fruits and vegetables as well as whole grain sources of carbohydrates to maximize fiber intake.   RD discussed why it is important for patient to adhere to diet recommendations, and emphasized the role of fluids, foods to avoid, and importance of weighing self daily. Teach back method used.  Expect good compliance.  Body mass index is 34.82 kg/(m^2). Pt meets criteria for obesity class 2 based on current BMI.  Current diet order is Heart Healthy, patient is consuming approximately 75% of meals at this time. Labs and medications reviewed. No further nutrition interventions warranted at this time. RD contact information provided. If additional nutrition issues arise, please re-consult RD.   Colman Cater MS,RD,CSG,LDN Office: [phone] Pager: [phone]

## 2014-01-26 NOTE — Progress Notes (Signed)
Inpatient Diabetes Program Recommendations  AACE/ADA: New Consensus Statement on Inpatient Glycemic Control (2013)  Target Ranges:  Prepandial:   less than 140 mg/dL      Peak postprandial:   less than 180 mg/dL (1-2 hours)      Critically ill patients:  140 - 180 mg/dL  Results for Eugene [name], Eugene [name] (MRN [id number]) as of 01/26/2014 08:40  Ref. Range 01/25/2014 21:43  Glucose Latest Range: 70-99 mg/dL 348 (H)   Results for Eugene [name], Eugene [name] (MRN [id number]) as of 01/26/2014 08:40  Ref. Range 01/26/2014 07:58  Glucose-Capillary Latest Range: 70-99 mg/dL 292 (H)   Diabetes history: DM2 Outpatient Diabetes medications: Lantus 60-80 units QHS Current orders for Inpatient glycemic control:Lantus 45 units QHS, Novolog 0-15 AC  Inpatient Diabetes Program Recommendations Insulin - Basal: Noted patient did not receive any basal insulin last night since Lantus was ordered to start tonight at bedtime. May want to consider changing Lantus 45 units QHS to daily so that the patient will receive basal insulin this morning.  Otherwise, anticipate CBGs to be elevated throughout the day due to the lack of basal insulin.   Thanks, Barnie Alderman, RN, MSN, CCRN Diabetes Coordinator Inpatient Diabetes Program [phone] (Team Pager) [phone] (AP office) [phone] [facility] office)

## 2014-01-27 DIAGNOSIS — N179 Acute kidney failure, unspecified: Secondary | ICD-10-CM

## 2014-01-27 DIAGNOSIS — E1149 Type 2 diabetes mellitus with other diabetic neurological complication: Secondary | ICD-10-CM

## 2014-01-27 DIAGNOSIS — G609 Hereditary and idiopathic neuropathy, unspecified: Secondary | ICD-10-CM

## 2014-01-27 DIAGNOSIS — N189 Chronic kidney disease, unspecified: Secondary | ICD-10-CM

## 2014-01-27 DIAGNOSIS — I5033 Acute on chronic diastolic (congestive) heart failure: Principal | ICD-10-CM

## 2014-01-27 LAB — BASIC METABOLIC PANEL
BUN: 44 mg/dL — AB (ref 6–23)
CHLORIDE: 94 meq/L — AB (ref 96–112)
CO2: 28 mEq/L (ref 19–32)
CREATININE: 1.69 mg/dL — AB (ref 0.50–1.35)
Calcium: 8.5 mg/dL (ref 8.4–10.5)
GFR calc Af Amer: 50 mL/min — ABNORMAL LOW (ref >90)
GFR calc non Af Amer: 43 mL/min — ABNORMAL LOW (ref >90)
Glucose, Bld: 250 mg/dL — ABNORMAL HIGH (ref 70–99)
Potassium: 4.4 mEq/L (ref 3.7–5.3)
Sodium: 134 mEq/L — ABNORMAL LOW (ref 137–147)

## 2014-01-27 LAB — GLUCOSE, CAPILLARY
GLUCOSE-CAPILLARY: 220 mg/dL — AB (ref 70–99)
Glucose-Capillary: 272 mg/dL — ABNORMAL HIGH (ref 70–99)
Glucose-Capillary: 293 mg/dL — ABNORMAL HIGH (ref 70–99)

## 2014-01-27 MED ORDER — TORSEMIDE 20 MG PO TABS: 10.0000 mg | ORAL_TABLET | Freq: Two times a day (BID) | ORAL | Status: DC

## 2014-01-27 MED ORDER — INSULIN ASPART 100 UNIT/ML ~~LOC~~ SOLN
5.0000 [IU] | Freq: Once | SUBCUTANEOUS | Status: AC
  Administered 2014-01-27: 5 [IU] via SUBCUTANEOUS

## 2014-01-27 MED ORDER — POTASSIUM CHLORIDE CRYS ER 20 MEQ PO TBCR: 20.0000 meq | EXTENDED_RELEASE_TABLET | Freq: Every day | ORAL | Status: DC

## 2014-01-27 NOTE — Discharge Summary (Signed)
Physician Discharge Summary  Eugene [name] [id number] DOB: 23-Jan-1956 DOA: 01/25/2014  PCP: [name], MD  Admit date: 01/25/2014 Discharge date: 01/27/2014  Time spent: 40 minutes  Recommendations for Outpatient Follow-up:  1. Patient has been set up with home health RN. 2. He'll follow up with primary care physician in one to 2 weeks 3. Follow with cardiology as an outpatient  Discharge Diagnoses:  Principal Problem:   Acute on chronic diastolic heart failure Active Problems:   DM type 2 (diabetes mellitus, type 2)   Hypertension   CAD- MI/RCA PCI-DES x2 2012, and RCA DES Jan 2014   Hyperlipidemia   Obstructive sleep apnea- on C-pap   Gastroesophageal reflux disease   Chronic renal insufficiency, stage III (moderate)   Acute on chronic renal failure   Discharge Condition: improved  Diet recommendation: low salt, low carb  Filed Weights   01/26/14 0208 01/26/14 0626 01/27/14 0453  Weight: 118.343 kg (260 lb 14.4 oz) 119.7 kg (263 lb 14.3 oz) 119.931 kg (264 lb 6.4 oz)    History of present illness:  Eugene [name] is a 58 y.o. male with a past medical history significant for coronary artery disease, hyperlipidemia, diabetes mellitus, hypertension, asthma/COPD, anxiety and GERD; presented to the emergency department complaining of chest discomfort, increased lower extremity swelling and shortness of breath. Patient describes that his symptoms has been resting and steadily worsening for the last 3 days prior to admission. Patient reports recent adjustment done to his diuretics secondary to worsening of chronic kidney function, since then patient has been noticing increased weight, increased swelling of his legs, shortness of breath (especially on exertion) and also for orthopnea. In the emergency department patient has elevated BNP, chest x-ray demonstrating vascular congestion, positive orthopnea and normal troponin X2. [facility] hospitalist has been called to admit the  patient for further evaluation and treatment.   Hospital Course:  This patient was admitted to the hospital with increased motion of edema and shortness of breath. He was found to have acute on chronic diastolic congestive heart failure. Patient was seen by cardiology and started on intravenous Lasix. He recently had an echocardiogram done in 11/2013, so this was not repeated. The patient had excellent response to intravenous diuretics and was quickly transitioned to oral Demadex. It was felt that he was approaching euvolemic. He was educated regarding the importance of salt restriction and dietary restrictions. He reports compliance with medications. Patient is now ambulating without any difficulty and no longer has any shortness of breath. Lower extremity edema has resolved. He's been cleared for discharge by cardiology. His outpatient dose of Demadex was increased from 20 mg daily to 20 mg in a.m. and 10 mg in PM. He will followup with cardiology in the next few weeks.  Procedures:    Consultations:  Cardiology  Discharge Exam: Filed Vitals:   01/27/14 1030  BP: 111/64  Pulse: 74  Temp:   Resp:     General: NAD Cardiovascular: S1, S2 RRR Respiratory: CTA B  Discharge Instructions You were cared for by a hospitalist during your hospital stay. If you have any questions about your discharge medications or the care you received while you were in the hospital after you are discharged, you can call the unit and asked to speak with the hospitalist on call if the hospitalist that took care of you is not available. Once you are discharged, your primary care physician will handle any further medical issues. Please note that NO REFILLS for any discharge medications  will be authorized once you are discharged, as it is imperative that you return to your primary care physician (or establish a relationship with a primary care physician if you do not have one) for your aftercare needs so that they can  reassess your need for medications and monitor your lab values.  Discharge Instructions   (HEART FAILURE PATIENTS) Call MD:  Anytime you have any of the following symptoms: 1) 3 pound weight gain in 24 hours or 5 pounds in 1 week 2) shortness of breath, with or without a dry hacking cough 3) swelling in the hands, feet or stomach 4) if you have to sleep on extra pillows at night in order to breathe.    Complete by:  As directed      Diet - low sodium heart healthy    Complete by:  As directed      Face-to-face encounter (required for Medicare/Medicaid patients)    Complete by:  As directed   I Kathie Dike certify that this patient is under my care and that I, or a nurse practitioner or physician's assistant working with me, had a face-to-face encounter that meets the physician face-to-face encounter requirements with this patient on 01/27/2014. The encounter with the patient was in whole, or in part for the following medical condition(s) which is the primary reason for home health care (List medical condition): admitted with CHF exacerbation. Needs home health RN  The encounter with the patient was in whole, or in part, for the following medical condition, which is the primary reason for home health care:  chf exacerbation  I certify that, based on my findings, the following services are medically necessary home health services:  Nursing  My clinical findings support the need for the above services:  Shortness of breath with activity  Further, I certify that my clinical findings support that this patient is homebound due to:  Shortness of Breath with activity  Reason for Medically Necessary Home Health Services:  Skilled Nursing- Teaching of Disease Process/Symptom Management     Home Health    Complete by:  As directed   To provide the following care/treatments:  RN     Increase activity slowly    Complete by:  As directed             Medication List         ALPRAZolam 0.5 MG tablet   Commonly known as:  XANAX  Take 0.5-1 tablets (0.25-0.5 mg total) by mouth at bedtime as needed for sleep.     aspirin EC 81 MG tablet  Take 81 mg by mouth at bedtime.     budesonide-formoterol 160-4.5 MCG/ACT inhaler  Commonly known as:  SYMBICORT  Inhale 2 puffs into the lungs 2 (two) times daily.     clopidogrel 75 MG tablet  Commonly known as:  PLAVIX  Take 75 mg by mouth at bedtime.     doxazosin 4 MG tablet  Commonly known as:  CARDURA  Take 2 mg by mouth at bedtime.     fenofibrate 160 MG tablet  Take 160 mg by mouth at bedtime.     hydrALAZINE 25 MG tablet  Commonly known as:  APRESOLINE  Take 0.5 tablets (12.5 mg total) by mouth 2 (two) times daily.     LANTUS SOLOSTAR 100 UNIT/ML Solostar Pen  Generic drug:  Insulin Glargine  Inject 60-80 Units into the skin at bedtime.     methylcellulose 1 % ophthalmic solution  Commonly known as:  ARTIFICIAL TEARS  Place 2 drops into the left eye 2 (two) times daily as needed (dry eye).     metoprolol tartrate 25 MG tablet  Commonly known as:  LOPRESSOR  Take 1 tablet (25 mg total) by mouth 2 (two) times daily.     nitroGLYCERIN 0.4 MG SL tablet  Commonly known as:  NITROSTAT  Place 0.4 mg under the tongue every 5 (five) minutes as needed for chest pain.     potassium chloride SA 20 MEQ tablet  Commonly known as:  K-DUR,KLOR-CON  Take 1 tablet (20 mEq total) by mouth daily.     torsemide 20 MG tablet  Commonly known as:  DEMADEX  Take 0.5-1 tablets (10-20 mg total) by mouth 2 (two) times daily. Take 1 tab po qam and 0.5 tab po qpm     valsartan 80 MG tablet  Commonly known as:  DIOVAN  Take 80 mg by mouth every morning.       Allergies  Allergen Reactions  . Hydrocodone Nausea And Vomiting  . Lisinopril Cough  . Neurontin [Gabapentin]     Side effects, suicidal thoughts  . Statins Other (See Comments)    Muscle aches  . Metformin And Related     Intestinal side effects       Follow-up Information    Follow up with LUKING,SCOTT, MD. Schedule an appointment as soon as possible for a visit in 2 weeks.   Specialty:  Family Medicine   Contact information:   [address] [place] [address] [phone]       Follow up with Herminio Commons, MD.   Specialty:  Cardiology   Contact information:   [address] [place] [address] [phone]        The results of significant diagnostics from this hospitalization (including imaging, microbiology, ancillary and laboratory) are listed below for reference.    Significant Diagnostic Studies: Dg Chest 2 View  01/25/2014   CLINICAL DATA:  Chest pain and shortness of breath.  EXAM: CHEST  2 VIEW  COMPARISON:  Chest radiograph December 13, 2013.  FINDINGS: The cardiac silhouette appears mildly enlarged, similar. Mild central pulmonary vasculature congestion in this low inspiratory examination without pleural effusions. Minimal bibasilar strandy densities. No pneumothorax.  Soft tissue planes and included osseous structures are nonsuspicious. Mild degenerative change of thoracic spine.  IMPRESSION: Mild cardiomegaly and central pulmonary vasculature congestion with minimal bibasilar atelectasis.   Electronically Signed   By: Elon Alas   On: 01/25/2014 23:02    Microbiology: No results found for this or any previous visit (from the past 240 hour(s)).   Labs: Basic Metabolic Panel:  Recent Labs Lab 01/25/14 2143 01/26/14 0238 01/27/14 0532  NA 135*  --  134*  K 4.9  --  4.4  CL 96  --  94*  CO2 27  --  28  GLUCOSE 348*  --  250*  BUN 37*  --  44*  CREATININE 1.40*  --  1.69*  CALCIUM 9.2  --  8.5  MG  --  1.9  --    Liver Function Tests: No results found for this basename: AST, ALT, ALKPHOS, BILITOT, PROT, ALBUMIN,  in the last 168 hours No results found for this basename: LIPASE, AMYLASE,  in the last 168 hours No results found for this basename: AMMONIA,  in the last 168 hours CBC:  Recent Labs Lab  01/25/14 2143  WBC 6.1  NEUTROABS 3.7  HGB 11.0*  HCT 31.3*  MCV 88.4  PLT 190   Cardiac Enzymes:  Recent Labs Lab 01/25/14 2143 01/26/14 0238 01/26/14 0608 01/26/14 1357  TROPONINI <0.30 <0.30 <0.30 <0.30   BNP: BNP (last 3 results)  Recent Labs  09/12/13 1752 10/15/13 2015 01/25/14 2143  PROBNP 213.9* 221.3* 678.1*   CBG:  Recent Labs Lab 01/26/14 2109 01/26/14 2330 01/27/14 0225 01/27/14 0723 01/27/14 1211  GLUCAP 319* 352* 272* 220* 293*       Signed:  Jolaine Artist Darnise Montag  [facility] 01/27/2014, 5:48 PM

## 2014-01-27 NOTE — Progress Notes (Signed)
Subjective:  Patient feels like breathing is back to baseline, hoping to go home today.  Objective:  Vital Signs in the last 24 hours: Temp:  [97.2 F (36.2 C)-98.1 F (36.7 C)] 97.2 F (36.2 C) (06/03 0453) Pulse Rate:  [67-75] 70 (06/03 0453) Resp:  [20] 20 (06/03 0453) BP: (130-154)/(73-80) 130/74 mmHg (06/03 0453) SpO2:  [95 %-99 %] 99 % (06/03 0652) Weight:  [264 lb 6.4 oz (119.931 kg)] 264 lb 6.4 oz (119.931 kg) (06/03 0453)  Intake/Output from previous day: 06/02 0701 - 06/03 0700 In: 840 [P.O.:840] Out: 2425 [Urine:2425] Intake/Output from this shift:    Physical Exam: NECK: Without JVD, HJR, or bruit LUNGS: Decreased breath sounds but Clear anterior, posterior, lateral HEART: Regular rate and rhythm, no murmur, gallop, rub, bruit, thrill, or heave EXTREMITIES: Without cyanosis, clubbing, or edema   Lab Results:  Recent Labs  01/25/14 2143  WBC 6.1  HGB 11.0*  PLT 190    Recent Labs  01/25/14 2143 01/27/14 0532  NA 135* 134*  K 4.9 4.4  CL 96 94*  CO2 27 28  GLUCOSE 348* 250*  BUN 37* 44*  CREATININE 1.40* 1.69*    Recent Labs  01/26/14 0608 01/26/14 1357  TROPONINI <0.30 <0.30   Hepatic Function Panel No results found for this basename: PROT, ALBUMIN, AST, ALT, ALKPHOS, BILITOT, BILIDIR, IBILI,  in the last 72 hours  Recent Labs  01/26/14 0608  CHOL 279*   No results found for this basename: PROTIME,  in the last 72 hours  Imaging: 2Decho:11/2013 Study Conclusions  - Left ventricle: The cavity size was normal. Wall thickness   was increased in a pattern of severe LVH. Systolic   function was normal. The estimated ejection fraction was   in the range of 60% to 65%. Wall motion was normal; there   were no regional wall motion abnormalities. - Aortic valve: Trileaflet; mildly thickened leaflets. - Mitral valve: Calcified annulus. Mildly thickened leaflets   . - Left atrium: The atrium was mildly dilated. - Pericardium,  extracardiac: Small to medium size   pericardial effusion (1.69 cm in maximal diameter). No   evidence of tamponade physiology.  Myoview 11/2013: IMPRESSION: 1. No evidence for inducible ischemia. 2. Remote infarct involving the anterior inferior and septal wall. 3. Focal hypokinesis associated with the area of remote infarct. 4. Preserved cardiac function with an estimated ejection fraction of 60%.   Electronically Signed   By: Lawrence Santiago M.D.   On: 12/14/2013 11:57         Cardiac Studies:  Assessment/Plan:  1.Acute on Chronic Diastolic CHF: Secondary to dietary non-compliance with salty foods.Diuresed 1585 cc overnight on Lasix 60mg  IV BID.Crt up to 1.69. Watch closely. Weights up 9 lbs, can't be accurate.Was on Torsemide 20mg  daily at home will increase to 10mg  in the evening at discharge. F/u with Dr. Bronson Ing.   2.CAD:PCI to the RCA in Jan  2014, with non-obstructive disease elsewhere per cath. Negative Myoview 11/2013.EF 65%. He remains on Plavix and ASA, metoprolol 25 mg BID.   3. Hypertension: Blood pressure is better controlled  4. Diabetes; Not well controlled at home. Per PTH to follow.      LOS: 2 days    Eugene [name] 01/27/2014, 7:46 AM

## 2014-01-27 NOTE — Progress Notes (Signed)
Patient being d/c home. IV cath removed and intact. Verbalize understanding. No pain/swelling at site. No c/o pain at this time.

## 2014-01-27 NOTE — Progress Notes (Addendum)
Inpatient Diabetes Program Recommendations  AACE/ADA: New Consensus Statement on Inpatient Glycemic Control (2013)  Target Ranges:  Prepandial:   less than 140 mg/dL      Peak postprandial:   less than 180 mg/dL (1-2 hours)      Critically ill patients:  140 - 180 mg/dL   Results for Eugene [name], Eugene [name] (MRN [id number]) as of 01/27/2014 07:26  Ref. Range 01/26/2014 07:58 01/26/2014 11:58 01/26/2014 16:44 01/26/2014 21:09 01/26/2014 23:30 01/27/2014 02:25  Glucose-Capillary Latest Range: 70-99 mg/dL 292 (H) 222 (H) 230 (H) 319 (H) 352 (H) 272 (H)   Diabetes history: DM2 Outpatient Diabetes medications: Lantus 60-80 units QHS Current orders for Inpatient glycemic control: Lantus 45 units daily, Novolog 0-15 units AC  Inpatient Diabetes Program Recommendations Insulin - Basal: Please increase Lantus to 55 units dialy. Correction (SSI): Please increase Novolog correction to resistant scale and add Novolog bedtime correction scale. Insulin - Meal Coverage: Please consider adding Novolog 5 units TID with meals for meal coverage. HgbA1C: A1C 10.0% on 01/26/14 indicating poor glycemic control.  01/27/14@14 :00- Spoke with patient about diabetes and home regimen for diabetes control. Patient reports that he has been diagnosed with diabetes for over 20 years and he is followed by his PCP (Dr. [name]) for diabetes management.  Currently he takes Lantus 60-80 units QHS (if CBG 200 mg/dl or greater takes 80 units but if CBG less than 200 mg/dl he takes 60 units) as an outpatient for diabetes control.  According to the patient he has a target glucose of 130 mg/dl and he has Novolog insulin to use at home for correction but he does not use it. Inquired about knowledge about A1C and patient reports that he does know what an A1C is and reports that his last A1C was in the 8% range and that he is scheduled to have his blood work done June 14th so an A1C will be done at that time. Discussed A1C results (10.0% on 01/26/14) and  explained basic pathophysiology of DM Type 2, basic home care, importance of checking CBGs and maintaining good CBG control to prevent long-term and short-term complications. Patient reports that he checks his blood sugar at least once a day (at bedtime) and sometimes more if he feels "funny". According to the patient he experiences symptomatic hypoglycemia if his blood glucose gets less than "110 mg/dl or so".  Discussed impact of nutrition, exercise, stress, sickness, and medications on diabetes control.  Patient reports that his wife recently had knee surgery and he has been taking her to her physical therapy and doctor appointments and as a result they have been eating out more and he has not been exercising like he use to. Patient states that his wife is now able to drive herself so he plans to get back on track with eating better and exercising (reports that he joined the [employer] program last week). Patient does verbalize that he has a fear of doing too much because he is "scared he may have another silent heart attack". Encouraged patient to discuss exercise with his doctor and if they give him the okay to exercise to start slowly and make sure he shares with others at the [facility] or friends that he has suffered a heart attack and that he always has nitroglycerin in his pocket and explain what they can do to help him if he needs help.  Patient also shared that he has congestive heart failure and that he has to watch sodium in addition to  carbohydrates. Informed patient about free outpatient diabetes education class here at [facility] and provided him with written information on the free class. Also informed patient that if he felt he needed one on one diet education he could call the same number on the written information and request a time to sit down with the RD for education. Patient verbalized understanding of information discussed and he states that he has no further questions at this time  related to diabetes.   Thanks, Barnie Alderman, RN, MSN, CCRN Diabetes Coordinator Inpatient Diabetes Program [phone] (Team Pager) [phone] ([place] office) [phone] [facility] office)   Thanks, Barnie Alderman, RN, MSN, [place] Diabetes Coordinator Inpatient Diabetes Program [phone] (Team Pager) [phone] (AP office) [phone] [facility] office)

## 2014-01-27 NOTE — Discharge Instructions (Signed)
Heart Failure °Heart failure is a condition in which the heart has trouble pumping blood. This means your heart does not pump blood efficiently for your body to work well. In some cases of heart failure, fluid may back up into your lungs or you may have swelling (edema) in your lower legs. Heart failure is usually a long-term (chronic) condition. It is important for you to take good care of yourself and follow your caregiver's treatment plan. °CAUSES  °Some health conditions can cause heart failure. Those health conditions include: °· High blood pressure (hypertension) causes the heart muscle to work harder than normal. When pressure in the blood vessels is high, the heart needs to pump (contract) with more force in order to circulate blood throughout the body. High blood pressure eventually causes the heart to become stiff and weak. °· Coronary artery disease (CAD) is the buildup of cholesterol and fat (plaque) in the arteries of the heart. The blockage in the arteries deprives the heart muscle of oxygen and blood. This can cause chest pain and may lead to a heart attack. High blood pressure can also contribute to CAD. °· Heart attack (myocardial infarction) occurs when 1 or more arteries in the heart become blocked. The loss of oxygen damages the muscle tissue of the heart. When this happens, part of the heart muscle dies. The injured tissue does not contract as well and weakens the heart's ability to pump blood. °· Abnormal heart valves can cause heart failure when the heart valves do not open and close properly. This makes the heart muscle pump harder to keep the blood flowing. °· Heart muscle disease (cardiomyopathy or myocarditis) is damage to the heart muscle from a variety of causes. These can include drug or alcohol abuse, infections, or unknown reasons. These can increase the risk of heart failure. °· Lung disease makes the heart work harder because the lungs do not work properly. This can cause a strain  on the heart, leading it to fail. °· Diabetes increases the risk of heart failure. High blood sugar contributes to high fat (lipid) levels in the blood. Diabetes can also cause slow damage to tiny blood vessels that carry important nutrients to the heart muscle. When the heart does not get enough oxygen and food, it can cause the heart to become weak and stiff. This leads to a heart that does not contract efficiently. °· Other conditions can contribute to heart failure. These include abnormal heart rhythms, thyroid problems, and low blood counts (anemia). °Certain unhealthy behaviors can increase the risk of heart failure. Those unhealthy behaviors include: °· Being overweight. °· Smoking or chewing tobacco. °· Eating foods high in fat and cholesterol. °· Abusing illicit drugs or alcohol. °· Lacking physical activity. °SYMPTOMS  °Heart failure symptoms may vary and can be hard to detect. Symptoms may include: °· Shortness of breath with activity, such as climbing stairs. °· Persistent cough. °· Swelling of the feet, ankles, legs, or abdomen. °· Unexplained weight gain. °· Difficulty breathing when lying flat (orthopnea). °· Waking from sleep because of the need to sit up and get more air. °· Rapid heartbeat. °· Fatigue and loss of energy. °· Feeling lightheaded, dizzy, or close to fainting. °· Loss of appetite. °· Nausea. °· Increased urination during the night (nocturia). °DIAGNOSIS  °A diagnosis of heart failure is based on your history, symptoms, physical examination, and diagnostic tests. °Diagnostic tests for heart failure may include: °· Echocardiography. °· Electrocardiography. °· Chest X-ray. °· Blood tests. °· Exercise   stress test. °· Cardiac angiography. °· Radionuclide scans. °TREATMENT  °Treatment is aimed at managing the symptoms of heart failure. Medicines, behavioral changes, or surgical intervention may be necessary to treat heart failure. °· Medicines to help treat heart failure may  include: °· Angiotensin-converting enzyme (ACE) inhibitors. This type of medicine blocks the effects of a blood protein called angiotensin-converting enzyme. ACE inhibitors relax (dilate) the blood vessels and help lower blood pressure. °· Angiotensin receptor blockers. This type of medicine blocks the actions of a blood protein called angiotensin. Angiotensin receptor blockers dilate the blood vessels and help lower blood pressure. °· Water pills (diuretics). Diuretics cause the kidneys to remove salt and water from the blood. The extra fluid is removed through urination. This loss of extra fluid lowers the volume of blood the heart pumps. °· Beta blockers. These prevent the heart from beating too fast and improve heart muscle strength. °· Digitalis. This increases the force of the heartbeat. °· Healthy behavior changes include: °· Obtaining and maintaining a healthy weight. °· Stopping smoking or chewing tobacco. °· Eating heart healthy foods. °· Limiting or avoiding alcohol. °· Stopping illicit drug use. °· Physical activity as directed by your caregiver. °· Surgical treatment for heart failure may include: °· A procedure to open blocked arteries, repair damaged heart valves, or remove damaged heart muscle tissue. °· A pacemaker to improve heart muscle function and control certain abnormal heart rhythms. °· An internal cardioverter defibrillator to treat certain serious abnormal heart rhythms. °· A left ventricular assist device to assist the pumping ability of the heart. °HOME CARE INSTRUCTIONS  °· Take your medicine as directed by your caregiver. Medicines are important in reducing the workload of your heart, slowing the progression of heart failure, and improving your symptoms. °· Do not stop taking your medicine unless directed by your caregiver. °· Do not skip any dose of medicine. °· Refill your prescriptions before you run out of medicine. Your medicines are needed every day. °· Take over-the-counter  medicine only as directed by your caregiver or pharmacist. °· Engage in moderate physical activity if directed by your caregiver. Moderate physical activity can benefit some people. The elderly and people with severe heart failure should consult with a caregiver for physical activity recommendations. °· Eat heart healthy foods. Food choices should be free of trans fat and low in saturated fat, cholesterol, and salt (sodium). Healthy choices include fresh or frozen fruits and vegetables, fish, lean meats, legumes, fat-free or low-fat dairy products, and whole grain or high fiber foods. Talk to a dietitian to learn more about heart healthy foods. °· Limit sodium if directed by your caregiver. Sodium restriction may reduce symptoms of heart failure in some people. Talk to a dietitian to learn more about heart healthy seasonings. °· Use healthy cooking methods. Healthy cooking methods include roasting, grilling, broiling, baking, poaching, steaming, or stir-frying. Talk to a dietitian to learn more about healthy cooking methods. °· Limit fluids if directed by your caregiver. Fluid restriction may reduce symptoms of heart failure in some people. °· Weigh yourself every day. Daily weights are important in the early recognition of excess fluid. You should weigh yourself every morning after you urinate and before you eat breakfast. Wear the same amount of clothing each time you weigh yourself. Record your daily weight. Provide your caregiver with your weight record. °· Monitor and record your blood pressure if directed by your caregiver. °· Check your pulse if directed by your caregiver. °· Lose weight if directed   by your caregiver. Weight loss may reduce symptoms of heart failure in some people. °· Stop smoking or chewing tobacco. Nicotine makes your heart work harder by causing your blood vessels to constrict. Do not use nicotine gum or patches before talking to your caregiver. °· Schedule and attend follow-up visits as  directed by your caregiver. It is important to keep all your appointments. °· Limit alcohol intake to no more than 1 drink per day for nonpregnant women and 2 drinks per day for men. Drinking more than that is harmful to your heart. Tell your caregiver if you drink alcohol several times a week. Talk with your caregiver about whether alcohol is safe for you. If your heart has already been damaged by alcohol or you have severe heart failure, drinking alcohol should be stopped completely. °· Stop illicit drug use. °· Stay up-to-date with immunizations. It is especially important to prevent respiratory infections through current pneumococcal and influenza immunizations. °· Manage other health conditions such as hypertension, diabetes, thyroid disease, or abnormal heart rhythms as directed by your caregiver. °· Learn to manage stress. °· Plan rest periods when fatigued. °· Learn strategies to manage high temperatures. If the weather is extremely hot: °· Avoid vigorous physical activity. °· Use air conditioning or fans or seek a cooler location. °· Avoid caffeine and alcohol. °· Wear loose-fitting, lightweight, and light-colored clothing. °· Learn strategies to manage cold temperatures. If the weather is extremely cold: °· Avoid vigorous physical activity. °· Layer clothes. °· Wear mittens or gloves, a hat, and a scarf when going outside. °· Avoid alcohol. °· Obtain ongoing education and support as needed. °· Participate or seek rehabilitation as needed to maintain or improve independence and quality of life. °SEEK MEDICAL CARE IF:  °· Your weight increases by 03 lb/1.4 kg in 1 day or 05 lb/2.3 kg in a week. °· You have increasing shortness of breath that is unusual for you. °· You are unable to participate in your usual physical activities. °· You tire easily. °· You cough more than normal, especially with physical activity. °· You have any or more swelling in areas such as your hands, feet, ankles, or abdomen. °· You  are unable to sleep because it is hard to breathe. °· You feel like your heart is beating fast (palpitations). °· You become dizzy or lightheaded upon standing up. °SEEK IMMEDIATE MEDICAL CARE IF:  °· You have difficulty breathing. °· There is a change in mental status such as decreased alertness or difficulty with concentration. °· You have a pain or discomfort in your chest. °· You have an episode of fainting (syncope). °MAKE SURE YOU:  °· Understand these instructions. °· Will watch your condition. °· Will get help right away if you are not doing well or get worse. °Document Released: 08/13/2005 Document Revised: 12/08/2012 Document Reviewed: 09/04/2012 °[employer]® Patient Information ©2014 [employer]. ° °

## 2014-01-27 NOTE — Progress Notes (Signed)
The patient was seen and examined, and I agree with the assessment and plan as documented above, with modifications as noted below. Pt feels much better. He felt the dietary consultation was "very helpful", and plans to go grocery shopping later today with his wife and purchase only low-sodium foods. Recommend increasing torsemide to 20 mg q am and 10 mg q pm, with close monitoring of renal function (would obtain BMET on 6/5). Stable for discharge from my standpoint. I will follow up with him in the office.

## 2014-01-29 ENCOUNTER — Encounter: Payer: Self-pay | Admitting: Family Medicine

## 2014-01-29 ENCOUNTER — Ambulatory Visit (INDEPENDENT_AMBULATORY_CARE_PROVIDER_SITE_OTHER): Payer: Medicare PPO | Admitting: Family Medicine

## 2014-01-29 VITALS — BP 138/70 | Ht 73.0 in | Wt 273.0 lb

## 2014-01-29 DIAGNOSIS — I10 Essential (primary) hypertension: Secondary | ICD-10-CM

## 2014-01-29 DIAGNOSIS — G609 Hereditary and idiopathic neuropathy, unspecified: Secondary | ICD-10-CM

## 2014-01-29 DIAGNOSIS — Z23 Encounter for immunization: Secondary | ICD-10-CM

## 2014-01-29 DIAGNOSIS — R609 Edema, unspecified: Secondary | ICD-10-CM | POA: Insufficient documentation

## 2014-01-29 DIAGNOSIS — I739 Peripheral vascular disease, unspecified: Secondary | ICD-10-CM

## 2014-01-29 DIAGNOSIS — R600 Localized edema: Secondary | ICD-10-CM

## 2014-01-29 DIAGNOSIS — G629 Polyneuropathy, unspecified: Secondary | ICD-10-CM

## 2014-01-29 DIAGNOSIS — E119 Type 2 diabetes mellitus without complications: Secondary | ICD-10-CM

## 2014-01-29 MED ORDER — METOPROLOL TARTRATE 25 MG PO TABS: 25.0000 mg | ORAL_TABLET | Freq: Two times a day (BID) | ORAL | Status: DC

## 2014-01-29 MED ORDER — TORSEMIDE 20 MG PO TABS: 10.0000 mg | ORAL_TABLET | Freq: Two times a day (BID) | ORAL | Status: DC

## 2014-01-29 NOTE — Progress Notes (Signed)
   Subjective:    Patient ID: Eugene [name], male    DOB: 20-Jan-1956, 58 y.o.   MRN: [id number]  HPI Patient is here today for a HTN check up.  He ended up going to the ER on June 1st in the evening. He said his breathing got worst. They removed fluid from his lungs and prescribed his Potassium. He has not picked up his rx yet.   He feels a lot better now.   Pt is also on a low sodium diet.  Significant time was spent with patient discussing his diabetes CHF HTN medications hospitalization. 40 minutes spent with patient   Review of Systems  Constitutional: Negative for activity change, appetite change and fatigue.  HENT: Negative for congestion.   Respiratory: Negative for cough.   Cardiovascular: Negative for chest pain.  Gastrointestinal: Negative for abdominal pain.  Endocrine: Negative for polydipsia and polyphagia.  Neurological: Negative for weakness.  Psychiatric/Behavioral: Negative for confusion.       Objective:   Physical Exam  Vitals reviewed. Constitutional: He appears well-nourished. No distress.  Cardiovascular: Normal rate, regular rhythm and normal heart sounds.   No murmur heard. Pulmonary/Chest: Effort normal and breath sounds normal. No respiratory distress.  Abdominal: Soft. There is no tenderness.  Musculoskeletal: He exhibits edema.  Lymphadenopathy:    He has no cervical adenopathy.  Neurological: He is alert.  Skin: Skin is warm. No rash noted. No erythema.  Psychiatric: His behavior is normal.          Assessment & Plan:  #1 HTN on recheck it is very good. #2 diabetes subpar control he is to check his sugar levels on a frequent basis sinus readings that more than likely will need additional medications #3 patient having a difficult time affording Diovan he will look into the cost of losartan if so he will let us know we can always send a new prescription  #4 CHF we went over that basic guidelines of how one should try to monitor that  and warning signs. Also low salt diet #5 elevated triglycerides I told him get his diabetes under better control this will get under better control continue current medication #6 followup in 3 months sooner if any other problems #7 pedal edema left leg has diminished pulses no tenderness in the calf I do not feel that this is a DVT I feel it's venous insufficiency I also think he has claudication check ABI

## 2014-02-02 ENCOUNTER — Telehealth: Payer: Self-pay | Admitting: Family Medicine

## 2014-02-02 NOTE — Telephone Encounter (Signed)
Patient was taking diovan for his blood pressure, but Dr. Nicki Reaper had mentioned that he could switch it to a med that he was supposed to check with his insurance company to see if they covered. He lost the paper that had the name of the medicine and would like a nurse to call him back.

## 2014-02-02 NOTE — Telephone Encounter (Signed)
Med was Losartan per progress note- Patient notified.

## 2014-02-03 ENCOUNTER — Ambulatory Visit: Payer: Medicare PPO | Admitting: Family Medicine

## 2014-02-03 ENCOUNTER — Other Ambulatory Visit: Payer: Self-pay | Admitting: *Deleted

## 2014-02-03 MED ORDER — LOSARTAN POTASSIUM 100 MG PO TABS: 100.0000 mg | ORAL_TABLET | Freq: Every day | ORAL | Status: DC

## 2014-02-04 ENCOUNTER — Ambulatory Visit (HOSPITAL_COMMUNITY): Payer: Medicare HMO

## 2014-02-08 ENCOUNTER — Other Ambulatory Visit: Payer: Self-pay

## 2014-02-08 DIAGNOSIS — Z79899 Other long term (current) drug therapy: Secondary | ICD-10-CM

## 2014-02-10 ENCOUNTER — Telehealth: Payer: Self-pay | Admitting: Family Medicine

## 2014-02-10 NOTE — Telephone Encounter (Signed)
Review labs from [place].

## 2014-02-12 ENCOUNTER — Other Ambulatory Visit (HOSPITAL_COMMUNITY): Payer: Self-pay | Admitting: Cardiology

## 2014-02-12 ENCOUNTER — Ambulatory Visit: Payer: Medicare PPO | Admitting: Family Medicine

## 2014-02-12 DIAGNOSIS — I313 Pericardial effusion (noninflammatory): Secondary | ICD-10-CM

## 2014-02-12 DIAGNOSIS — I3139 Other pericardial effusion (noninflammatory): Secondary | ICD-10-CM

## 2014-02-15 NOTE — Telephone Encounter (Signed)
His lab work overall is fairly good hemoglobin slightly low healthy eating will help that. Kidney functions are stable. Recent A1c looks very good. She keep all regular followup visits with [place]. We should see him at least every 3 months.

## 2014-02-15 NOTE — Telephone Encounter (Signed)
Pt notified and verbalized understanding.

## 2014-02-16 DIAGNOSIS — I509 Heart failure, unspecified: Secondary | ICD-10-CM

## 2014-02-16 DIAGNOSIS — I129 Hypertensive chronic kidney disease with stage 1 through stage 4 chronic kidney disease, or unspecified chronic kidney disease: Secondary | ICD-10-CM

## 2014-02-16 DIAGNOSIS — I5033 Acute on chronic diastolic (congestive) heart failure: Secondary | ICD-10-CM

## 2014-02-16 DIAGNOSIS — E119 Type 2 diabetes mellitus without complications: Secondary | ICD-10-CM

## 2014-02-19 ENCOUNTER — Ambulatory Visit: Payer: Medicare HMO | Admitting: Cardiovascular Disease

## 2014-02-19 ENCOUNTER — Other Ambulatory Visit: Payer: Self-pay | Admitting: *Deleted

## 2014-02-19 MED ORDER — LOSARTAN POTASSIUM 100 MG PO TABS: 100.0000 mg | ORAL_TABLET | Freq: Every day | ORAL | Status: DC

## 2014-02-19 MED ORDER — CLOPIDOGREL BISULFATE 75 MG PO TABS: 75.0000 mg | ORAL_TABLET | Freq: Every day | ORAL | Status: DC

## 2014-02-19 MED ORDER — HYDRALAZINE HCL 25 MG PO TABS: 12.5000 mg | ORAL_TABLET | Freq: Two times a day (BID) | ORAL | Status: DC

## 2014-02-22 ENCOUNTER — Encounter: Payer: Self-pay | Admitting: Family Medicine

## 2014-03-01 ENCOUNTER — Other Ambulatory Visit: Payer: Self-pay | Admitting: Family Medicine

## 2014-03-03 ENCOUNTER — Other Ambulatory Visit (INDEPENDENT_AMBULATORY_CARE_PROVIDER_SITE_OTHER): Payer: Medicare HMO

## 2014-03-03 ENCOUNTER — Other Ambulatory Visit: Payer: Self-pay

## 2014-03-03 DIAGNOSIS — I3139 Other pericardial effusion (noninflammatory): Secondary | ICD-10-CM

## 2014-03-03 DIAGNOSIS — I319 Disease of pericardium, unspecified: Secondary | ICD-10-CM

## 2014-03-03 DIAGNOSIS — I313 Pericardial effusion (noninflammatory): Secondary | ICD-10-CM

## 2014-03-04 ENCOUNTER — Telehealth: Payer: Self-pay | Admitting: Family Medicine

## 2014-03-04 NOTE — Telephone Encounter (Signed)
Patient said that with [facility], he is in what they call an insurance gap, and they aren't paying for his insulin and he has to pay $342 to get this. He said that he cannot afford this and has already gone a day without his insulin. He wants to know what he should do? Can we prescribe something else in Tier 1 or 2? Please advise.  [employer].

## 2014-03-04 NOTE — Telephone Encounter (Signed)
Unfortunately there are no easy answers. Most assistance programs wall to help someone who has insurance especially if their family in come is fairly decent. On monitoring he may be able to find an assistance program for his insulin. Another option is to call his insurance pharmacy to ask them what is the lowest cost of insulin that could be used. He can let us know and if feasible I do recommend a proper dose of that medicine to try to help him.

## 2014-03-04 NOTE — Telephone Encounter (Signed)
Patient stated he will check and find out the cheapest insurance for him and call us back and let us know.

## 2014-03-10 ENCOUNTER — Ambulatory Visit: Payer: Medicare HMO | Admitting: Cardiovascular Disease

## 2014-03-11 ENCOUNTER — Telehealth: Payer: Self-pay | Admitting: *Deleted

## 2014-03-11 NOTE — Telephone Encounter (Signed)
Message copied by Laurine Blazer on Thu Mar 11, 2014 10:57 AM ------      Message from: Kate Sable A      Created: Wed Mar 03, 2014  6:33 PM       Stable pericardial effusion. Repeat limited echo in 3 months to reassess. ------

## 2014-03-11 NOTE — Telephone Encounter (Signed)
Notes Recorded by Laurine Blazer, LPN on [id number] at 579FGE AM Patient notified.

## 2014-03-12 NOTE — Telephone Encounter (Signed)
Eugene [name] from [facility] is currently trying to help patient apply for Medicare L[place] ([place]).  He is going to bring some financial documents for her to copy and she is going to bring him a coupon for his Lantus - to hopefully help hold him while we go through this application process.

## 2014-03-15 ENCOUNTER — Ambulatory Visit (INDEPENDENT_AMBULATORY_CARE_PROVIDER_SITE_OTHER): Payer: Medicare PPO | Admitting: Family Medicine

## 2014-03-15 ENCOUNTER — Encounter: Payer: Self-pay | Admitting: Family Medicine

## 2014-03-15 VITALS — BP 138/80 | Ht 73.0 in | Wt 273.0 lb

## 2014-03-15 DIAGNOSIS — I10 Essential (primary) hypertension: Secondary | ICD-10-CM

## 2014-03-15 NOTE — Progress Notes (Signed)
   Subjective:    Patient ID: Eugene [name], male    DOB: 1955/10/10, 58 y.o.   MRN: [id number]  HPI Patient arrives for diabetic check up. Patient states he can afford his insulin he has been getting extra vials from his family but can not afford it on his own.   History of CHF history diabetes history hypertension history of frequent hospitalizations Review of Systems  Constitutional: Negative for activity change, appetite change and fatigue.  HENT: Negative for congestion.   Respiratory: Negative for cough.   Cardiovascular: Negative for chest pain.  Gastrointestinal: Negative for abdominal pain.  Endocrine: Negative for polydipsia and polyphagia.  Neurological: Negative for weakness.  Psychiatric/Behavioral: Negative for confusion.       Objective:   Physical Exam  Vitals reviewed. Constitutional: He appears well-nourished. No distress.  Cardiovascular: Normal rate and normal heart sounds.   No murmur heard. Pulmonary/Chest: Effort normal and breath sounds normal. No respiratory distress.  Musculoskeletal: He exhibits no edema.  Lymphadenopathy:    He has no cervical adenopathy.  Neurological: He is alert.  Psychiatric: His behavior is normal.          Assessment & Plan:  #1 CHF-no sign of it being uncompensated currently  #2 HTN poor control increase hydralazine half tablet 3 times daily continue other medications followup again in 8 weeks he does check his blood pressure through home health. Goal systolic less than [id number] diastolic less than 90  #3 diabetes poor control-pretty much impossible to control bleeding diabetes if you cannot afford your medication. Currently there is a Education officer, museum working with him to try to get his medication covered. Ideally he would be on a short acting insulin with meals and a long-acting insulin at bedtime. If his A1c is not improved by the time he follows up referral to endocrinology would be the next

## 2014-03-22 ENCOUNTER — Other Ambulatory Visit: Payer: Self-pay | Admitting: Family Medicine

## 2014-03-25 ENCOUNTER — Ambulatory Visit (INDEPENDENT_AMBULATORY_CARE_PROVIDER_SITE_OTHER): Payer: Medicare HMO | Admitting: Cardiovascular Disease

## 2014-03-25 ENCOUNTER — Encounter: Payer: Self-pay | Admitting: Cardiovascular Disease

## 2014-03-25 VITALS — BP 186/99 | HR 84 | Ht 73.0 in | Wt 263.0 lb

## 2014-03-25 DIAGNOSIS — E782 Mixed hyperlipidemia: Secondary | ICD-10-CM

## 2014-03-25 DIAGNOSIS — I319 Disease of pericardium, unspecified: Secondary | ICD-10-CM

## 2014-03-25 DIAGNOSIS — J439 Emphysema, unspecified: Secondary | ICD-10-CM

## 2014-03-25 DIAGNOSIS — I313 Pericardial effusion (noninflammatory): Secondary | ICD-10-CM

## 2014-03-25 DIAGNOSIS — Z9861 Coronary angioplasty status: Secondary | ICD-10-CM

## 2014-03-25 DIAGNOSIS — I10 Essential (primary) hypertension: Secondary | ICD-10-CM

## 2014-03-25 DIAGNOSIS — G4733 Obstructive sleep apnea (adult) (pediatric): Secondary | ICD-10-CM

## 2014-03-25 DIAGNOSIS — I2510 Atherosclerotic heart disease of native coronary artery without angina pectoris: Secondary | ICD-10-CM

## 2014-03-25 DIAGNOSIS — R0609 Other forms of dyspnea: Secondary | ICD-10-CM

## 2014-03-25 DIAGNOSIS — I3139 Other pericardial effusion (noninflammatory): Secondary | ICD-10-CM

## 2014-03-25 DIAGNOSIS — I5032 Chronic diastolic (congestive) heart failure: Secondary | ICD-10-CM

## 2014-03-25 DIAGNOSIS — J438 Other emphysema: Secondary | ICD-10-CM

## 2014-03-25 DIAGNOSIS — R0989 Other specified symptoms and signs involving the circulatory and respiratory systems: Secondary | ICD-10-CM

## 2014-03-25 DIAGNOSIS — Z955 Presence of coronary angioplasty implant and graft: Secondary | ICD-10-CM

## 2014-03-25 MED ORDER — HYDRALAZINE HCL 25 MG PO TABS: 25.0000 mg | ORAL_TABLET | Freq: Three times a day (TID) | ORAL | Status: DC

## 2014-03-25 NOTE — Progress Notes (Signed)
Patient ID: Eugene [name], male   DOB: 06-07-1956, 58 y.o.   MRN: [id number]      SUBJECTIVE: I evaluated the patient in June while he was hospitalized for acute on chronic diastolic heart failure, deemed secondary to dietary indiscretion. I had him consult with a dietitian during that time. He has been unable to afford insulin (Lantus) and his diabetes and hypertension have been uncontrolled. An echocardiogram dated 03/03/2014 demonstrated vigorous left ventricular systolic function, EF [id number], moderate to severe LVH, mild to moderately dilated aortic root 4.5 cm, and a stable moderate sized pericardial effusion with no evidence of tamponade physiology. He also has coronary artery disease and COPD.  He denies chest pain. He does have exertional dyspnea which has remained stable. He has received a lot of education from visiting nurses and has significantly modified his diet and adheres to a low-sodium diet now. He is wearing knee-high compression stockings provided by his PCP which has helped alleviate his leg swelling. His blood pressures at home have been in the 160/93 mmHg range. He denies palpitations, lightheadedness, dizziness and paroxysmal nocturnal dyspnea.  He takes torsemide 20 mg in the morning and 10 mg in the evening and measures his urine output.  Review of Systems: As per "subjective", otherwise negative.  Allergies  Allergen Reactions  . Hydrocodone Nausea And Vomiting  . Lisinopril Cough  . Neurontin [Gabapentin]     Side effects, suicidal thoughts  . Statins Other (See Comments)    Muscle aches  . Metformin And Related     Intestinal side effects    Current Outpatient Prescriptions  Medication Sig Dispense Refill  . ALPRAZolam (XANAX) 0.5 MG tablet Take 0.5-1 tablets (0.25-0.5 mg total) by mouth at bedtime as needed for sleep.  30 tablet  3  . aspirin EC 81 MG tablet Take 81 mg by mouth at bedtime.       . BD PEN NEEDLE NANO U/F 32G X 4 MM MISC USE AS DIRECTED   100 each  0  . budesonide-formoterol (SYMBICORT) 160-4.5 MCG/ACT inhaler Inhale 2 puffs into the lungs 2 (two) times daily.       . clopidogrel (PLAVIX) 75 MG tablet Take 1 tablet (75 mg total) by mouth at bedtime.  90 tablet  1  . fenofibrate 160 MG tablet Take 160 mg by mouth at bedtime.      . hydrALAZINE (APRESOLINE) 25 MG tablet Take 12.5 mg by mouth 3 (three) times daily.      [name] LANTUS SOLOSTAR 100 UNIT/ML Solostar Pen INJECT 0.8 MLS (80 UNITS TOTAL) INTO THE SKIN AT BEDTIME.  30 mL  1  . losartan (COZAAR) 100 MG tablet Take 1 tablet (100 mg total) by mouth daily.  90 tablet  1  . methylcellulose (ARTIFICIAL TEARS) 1 % ophthalmic solution Place 2 drops into the left eye 2 (two) times daily as needed (dry eye).      . metoprolol tartrate (LOPRESSOR) 25 MG tablet Take 1 tablet (25 mg total) by mouth 2 (two) times daily.  60 tablet  3  . nitroGLYCERIN (NITROSTAT) 0.4 MG SL tablet Place 0.4 mg under the tongue every 5 (five) minutes as needed for chest pain.      . potassium chloride SA (K-DUR,KLOR-CON) 20 MEQ tablet Take 1 tablet (20 mEq total) by mouth daily.  30 tablet  0  . torsemide (DEMADEX) 20 MG tablet Take 0.5-1 tablets (10-20 mg total) by mouth 2 (two) times daily. Take 1 tab po qam and  0.5 tab po qpm  45 tablet  1  . traMADol (ULTRAM) 50 MG tablet Take 50 mg by mouth every 6 (six) hours as needed.        No current facility-administered medications for this visit.    Past Medical History  Diagnosis Date  . Arteriosclerotic cardiovascular disease (ASCVD)     a. 05/2011 Cath/PCI: LM nl, LAD 33m, D1 small, D2 small 43m, LCX large 40p, RCA 50-60p, 99 hazy @ origin of PDA with 70-80 in PDA (2.5x26 Resolute Integrity & 3.0x15 Resolute Integrity DES).;  b. 08/2012 Inflat  STEMI/Cath/PCI: LM minor irregs, LAD 50p, D1 50, LCX nl, OM1 25, RCA 30-40p, 100d (treated with 2.75x55mm Promus Premier DES);  c. 08/2012 Echo: EF 55-60%, basal inferopost HK.  [name] Hyperlipidemia   . Diabetes mellitus      Peripheral neuropathy  . Bell palsy   . Hypertension   . COPD (chronic obstructive pulmonary disease)   . Sleep apnea   . Gallstones   . Cholelithiasis 07/2012    Asymptomatic; identified incidentally  . PONV (postoperative nausea and vomiting)   . Anxiety   . C. difficile colitis     a. 08/2012  . Myocardial infarct 09/08/12  . Nephrolithiasis   . Contrast dye induced nephropathy     a. 08/2012 post cath/pci  . CHF (congestive heart failure)   . Asthma   . Heart disease   . Old myocardial infarction   . Anginal pain   . GERD (gastroesophageal reflux disease)     Past Surgical History  Procedure Laterality Date  . Circumcision    . Stents    . Esophagogastroduodenoscopy      in [place] over 20 yrs ago  . Colonoscopy      In [facility], approximately 2011 per patient, was normal. Advised to come back in 10 years.  . Esophagogastroduodenoscopy  06/12/2012    Procedure: ESOPHAGOGASTRODUODENOSCOPY (EGD);  Surgeon: Daneil Dolin, MD;  [facility];  Service: Endoscopy;  Laterality: N/A;  9:45  . Cardiac catheterization    . Subxyphoid pericardial window N/A 06/23/2013    Procedure: SUBXYPHOID PERICARDIAL WINDOW;  Surgeon: Grace Isaac, MD;  Location: [place];  Service: Thoracic;  Laterality: N/A;  . Intraoperative transesophageal echocardiogram N/A 06/23/2013    Procedure: INTRAOPERATIVE TRANSESOPHAGEAL ECHOCARDIOGRAM;  Surgeon: Grace Isaac, MD;  Location: [place];  Service: Open Heart Surgery;  Laterality: N/A;    History   Social History  . Marital Status: Married    Spouse Name: N/A    Number of Children: 2  . Years of Education: N/A   Occupational History  . Shipping     [employer]   Social History Main Topics  . Smoking status: Never Smoker   . Smokeless tobacco: Never Used  . Alcohol Use: No     Comment: heavy etoh use 30 years ago  . Drug Use: No  . Sexual Activity: Yes    Birth Control/ Protection: None   Other  Topics Concern  . 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 Disabled at this point.     Filed Vitals:   03/25/14 1424  BP: 186/99  Pulse: 84  Height: 6\' 1"  (1.854 m)  Weight: 263 lb (119.296 kg)  SpO2: 95%    PHYSICAL EXAM General: NAD Neck: No JVD, no thyromegaly. Lungs: Clear to auscultation bilaterally with normal respiratory effort. CV: Nondisplaced  PMI.  Regular rate and rhythm, normal S1/S2, no S3/S4, no murmur. No pretibial or periankle edema. Wearing compression stockings. No carotid bruit.   Abdomen: Soft, nontender, no hepatosplenomegaly, no distention.  Neurologic: Alert and oriented x 3.  Psych: Normal affect. Extremities: No clubbing or cyanosis.   ECG: reviewed and available in electronic records.   Echo (03/03/14): - Left ventricle: Moderate concentric and severe focal basal septal hypertrophy. The cavity size was normal. Systolic function was vigorous. The estimated ejection fraction was in the range of 65% to 70%. Wall motion was normal; there were no regional wall motion abnormalities. - Aortic valve: Mildly calcified annulus. Trileaflet. - Aorta: Mild to moderately dilated aortic root. Aortic root dimension: 45 mm (ED). - Mitral valve: Mildly calcified annulus. - Left atrium: The atrium was moderately dilated. - Systemic veins: Not visualized. - Pericardium, extracardiac: Moderate size pericardial effusion, measuring 1.3 cm in greatest dimension. There is a mild degree of right atrial impingement, but no frank collapse. No evidence of tamponade physiology.  Impressions:  - Pericardial effusion is stable when compared to prior study dated 11/25/13.    ASSESSMENT AND PLAN: 1. CAD s/p PCI: He appears to be symptomatically stable. Continue aspirin, metoprolol, and Plavix.   2. Essential HTN: Uncontrolled today. Increase hydralazine 25 mg to tid along with losartan 100 mg daily. Likely contributing  to dyspnea along with his COPD.  3. Chronic diastolic heart failure: Euvolemic.Continue present diuretic regimen of torsemide 20 mg q am and 10 mg q pm. Continue low-sodium and fluid-restricted diet.  4. Hyperlipidemia: Intolerant to statins (muscle aches). Continue flaxseed oil, fenofibrate, and therapeutic lifestyle changes.   5. Pericardial effusion s/p pericardial window: Moderate in size and stable. Will repeat a limited echo in 3 months to reassess.   6. COPD: stable on Symbicort.   7. SOB: Likely related to HTN and COPD.  Dispo: f/u in October 2015.   Kate Sable, M.D., F.A.C.C.

## 2014-03-25 NOTE — Patient Instructions (Addendum)
   Increase Hydralazine to 25mg  three times per day. Continue all other medications.  Your physician wants you to follow up in:  3 months.  You will receive a reminder letter in the mail one-two months in advance.  If you don't receive a letter, please call our office to schedule the follow up appointment

## 2014-04-07 ENCOUNTER — Telehealth: Payer: Self-pay | Admitting: Family Medicine

## 2014-04-07 NOTE — Telephone Encounter (Signed)
Patient is requesting that Nurse Abigail Butts call him back to discuss his medication.

## 2014-04-07 NOTE — Telephone Encounter (Signed)
[facility] sent wrong insulin. Pt calling to see if we ever got his insulin in. I will talk with Olivia Mackie tomorrow when she gets back. Pt will be out of insulin Saturday.

## 2014-04-08 ENCOUNTER — Other Ambulatory Visit: Payer: Self-pay | Admitting: *Deleted

## 2014-04-08 MED ORDER — INSULIN DETEMIR 100 UNIT/ML ~~LOC~~ SOLN: SUBCUTANEOUS | Status: DC

## 2014-04-08 NOTE — Telephone Encounter (Signed)
Pt states he got a letter in the mail yesterday that his lantus was denied. Wants to know if he can change to levemere that was sent here for him instead of lantus.

## 2014-04-08 NOTE — Telephone Encounter (Signed)
He takes lanus 8o units at night. If it can be changed would it be same directions.

## 2014-04-08 NOTE — Telephone Encounter (Signed)
Pt notified. Med list changed.

## 2014-04-08 NOTE — Telephone Encounter (Signed)
Levemir is actually very same in how it works as Lantus, it is slightly different molecule but the same action keep instruction the same

## 2014-04-08 NOTE — Telephone Encounter (Signed)
Patient has requested that Abigail Butts call him back again today because he has one more question for her.

## 2014-04-30 ENCOUNTER — Other Ambulatory Visit: Payer: Self-pay | Admitting: Family Medicine

## 2014-04-30 NOTE — Telephone Encounter (Signed)
6 mo worth ok 

## 2014-05-08 ENCOUNTER — Emergency Department (HOSPITAL_COMMUNITY): Payer: Medicare HMO

## 2014-05-08 ENCOUNTER — Encounter (HOSPITAL_COMMUNITY): Payer: Self-pay | Admitting: Emergency Medicine

## 2014-05-08 ENCOUNTER — Emergency Department (HOSPITAL_COMMUNITY)
Admission: EM | Admit: 2014-05-08 | Discharge: 2014-05-08 | Disposition: A | Discharge status: Home / Self Care | Payer: Medicare HMO | Attending: Emergency Medicine | Admitting: Emergency Medicine

## 2014-05-08 DIAGNOSIS — R51 Headache: Secondary | ICD-10-CM | POA: Diagnosis not present

## 2014-05-08 DIAGNOSIS — Z79899 Other long term (current) drug therapy: Secondary | ICD-10-CM | POA: Diagnosis not present

## 2014-05-08 DIAGNOSIS — R079 Chest pain, unspecified: Secondary | ICD-10-CM | POA: Insufficient documentation

## 2014-05-08 DIAGNOSIS — IMO0002 Reserved for concepts with insufficient information to code with codable children: Secondary | ICD-10-CM | POA: Insufficient documentation

## 2014-05-08 DIAGNOSIS — Z8719 Personal history of other diseases of the digestive system: Secondary | ICD-10-CM | POA: Insufficient documentation

## 2014-05-08 DIAGNOSIS — I252 Old myocardial infarction: Secondary | ICD-10-CM | POA: Insufficient documentation

## 2014-05-08 DIAGNOSIS — I509 Heart failure, unspecified: Secondary | ICD-10-CM | POA: Insufficient documentation

## 2014-05-08 DIAGNOSIS — Z9861 Coronary angioplasty status: Secondary | ICD-10-CM | POA: Diagnosis not present

## 2014-05-08 DIAGNOSIS — I209 Angina pectoris, unspecified: Secondary | ICD-10-CM | POA: Diagnosis not present

## 2014-05-08 DIAGNOSIS — E119 Type 2 diabetes mellitus without complications: Secondary | ICD-10-CM | POA: Insufficient documentation

## 2014-05-08 DIAGNOSIS — I10 Essential (primary) hypertension: Secondary | ICD-10-CM | POA: Diagnosis not present

## 2014-05-08 DIAGNOSIS — R0789 Other chest pain: Secondary | ICD-10-CM | POA: Insufficient documentation

## 2014-05-08 DIAGNOSIS — Z87442 Personal history of urinary calculi: Secondary | ICD-10-CM | POA: Diagnosis not present

## 2014-05-08 DIAGNOSIS — Z8619 Personal history of other infectious and parasitic diseases: Secondary | ICD-10-CM | POA: Diagnosis not present

## 2014-05-08 DIAGNOSIS — Z9889 Other specified postprocedural states: Secondary | ICD-10-CM | POA: Diagnosis not present

## 2014-05-08 DIAGNOSIS — Z8669 Personal history of other diseases of the nervous system and sense organs: Secondary | ICD-10-CM | POA: Diagnosis not present

## 2014-05-08 DIAGNOSIS — Z7982 Long term (current) use of aspirin: Secondary | ICD-10-CM | POA: Insufficient documentation

## 2014-05-08 DIAGNOSIS — E785 Hyperlipidemia, unspecified: Secondary | ICD-10-CM | POA: Insufficient documentation

## 2014-05-08 DIAGNOSIS — J441 Chronic obstructive pulmonary disease with (acute) exacerbation: Secondary | ICD-10-CM | POA: Insufficient documentation

## 2014-05-08 DIAGNOSIS — Z7902 Long term (current) use of antithrombotics/antiplatelets: Secondary | ICD-10-CM | POA: Diagnosis not present

## 2014-05-08 DIAGNOSIS — F411 Generalized anxiety disorder: Secondary | ICD-10-CM | POA: Insufficient documentation

## 2014-05-08 DIAGNOSIS — Z794 Long term (current) use of insulin: Secondary | ICD-10-CM | POA: Diagnosis not present

## 2014-05-08 LAB — HEPATIC FUNCTION PANEL
ALK PHOS: 80 U/L (ref 39–117)
ALT: 24 U/L (ref 0–53)
AST: 29 U/L (ref 0–37)
Albumin: 3.5 g/dL (ref 3.5–5.2)
Total Bilirubin: 0.3 mg/dL (ref 0.3–1.2)
Total Protein: 6.9 g/dL (ref 6.0–8.3)

## 2014-05-08 LAB — TROPONIN I: Troponin I: <0.3 ng/mL (ref <0.30)

## 2014-05-08 LAB — CBC
HEMATOCRIT: 33.6 % — AB (ref 39.0–52.0)
Hemoglobin: 11.9 g/dL — ABNORMAL LOW (ref 13.0–17.0)
MCH: 30.8 pg (ref 26.0–34.0)
MCHC: 35.4 g/dL (ref 30.0–36.0)
MCV: 87 fL (ref 78.0–100.0)
PLATELETS: 210 10*3/uL (ref 150–400)
RBC: 3.86 MIL/uL — ABNORMAL LOW (ref 4.22–5.81)
RDW: 12.7 % (ref 11.5–15.5)
WBC: 7.1 10*3/uL (ref 4.0–10.5)

## 2014-05-08 LAB — BASIC METABOLIC PANEL
ANION GAP: 13 (ref 5–15)
BUN: 44 mg/dL — AB (ref 6–23)
CALCIUM: 10 mg/dL (ref 8.4–10.5)
CO2: 23 mEq/L (ref 19–32)
CREATININE: 1.6 mg/dL — AB (ref 0.50–1.35)
Chloride: 96 mEq/L (ref 96–112)
GFR calc non Af Amer: 46 mL/min — ABNORMAL LOW (ref >90)
GFR, EST AFRICAN AMERICAN: 54 mL/min — AB (ref >90)
Glucose, Bld: 317 mg/dL — ABNORMAL HIGH (ref 70–99)
Potassium: 4.7 mEq/L (ref 3.7–5.3)
Sodium: 132 mEq/L — ABNORMAL LOW (ref 137–147)

## 2014-05-08 MED ORDER — NITROGLYCERIN 0.4 MG SL SUBL
0.4000 mg | SUBLINGUAL_TABLET | Freq: Once | SUBLINGUAL | Status: AC
  Administered 2014-05-08: 0.4 mg via SUBLINGUAL
  Filled 2014-05-08: qty 1

## 2014-05-08 NOTE — ED Notes (Signed)
Pt reporting tightness in chest and neck as well as mild SOB.  States that symptoms began about 40 minutes ago following an argument with his wife.

## 2014-05-08 NOTE — ED Provider Notes (Signed)
CSN: [id number]     Arrival date & time 05/08/14  1843 History   First MD Initiated Contact with Patient 05/08/14 1856    This chart was scribed for Maudry Diego, MD by Terressa Koyanagi, ED Scribe. This patient was seen in room APA04/APA04 and the patient's care was started at 7:02 PM.  Chief Complaint  Patient presents with  . Chest Pain   Patient is a 58 y.o. male presenting with chest pain. The history is provided by the patient. No language interpreter was used.  Chest Pain Chest pain location: inferior sternal  Duration:  40 minutes Timing:  Constant Progression:  Improving Chronicity:  New Context: stress (arguing with spouse)   Relieved by:  None tried Worsened by:  Nothing tried Ineffective treatments:  None tried Associated symptoms: headache   Associated symptoms: no abdominal pain, no back pain, no cough, no diaphoresis and no fatigue   Risk factors: coronary artery disease, diabetes mellitus, high cholesterol, hypertension, male sex and obesity    PCP: [name], MD  HPI Comments: Eugene [name] is a 58 y.o. male who presents to the Emergency Department, with medical Hx noted below and significant for ASCVD, DM, HLD, HTN, MI, CHF, asthma and heart disease, complaining of inferior sternal chest pain and associated chest tightness, HA and neck pain onset 40 minutes ago. Pt reports he began to experience his Sx following a verbal altercation with his spouse. Pt notes that he had a MI in the past that was not accompanied by any chest pain. Pt denies diaphoresis, fever, chills.   Past Medical History  Diagnosis Date  . Arteriosclerotic cardiovascular disease (ASCVD)     a. 05/2011 Cath/PCI: LM nl, LAD 59m, D1 small, D2 small 57m, LCX large 40p, RCA 50-60p, 99 hazy @ origin of PDA with 70-80 in PDA (2.5x26 Resolute Integrity & 3.0x15 Resolute Integrity DES).;  b. 08/2012 Inflat  STEMI/Cath/PCI: LM minor irregs, LAD 50p, D1 50, LCX nl, OM1 25, RCA 30-40p, 100d (treated with  2.75x16mm Promus Premier DES);  c. 08/2012 Echo: EF 55-60%, basal inferopost HK.  [name] Hyperlipidemia   . Diabetes mellitus     Peripheral neuropathy  . Bell palsy   . Hypertension   . COPD (chronic obstructive pulmonary disease)   . Sleep apnea   . Gallstones   . Cholelithiasis 07/2012    Asymptomatic; identified incidentally  . PONV (postoperative nausea and vomiting)   . Anxiety   . C. difficile colitis     a. 08/2012  . Myocardial infarct 09/08/12  . Nephrolithiasis   . Contrast dye induced nephropathy     a. 08/2012 post cath/pci  . CHF (congestive heart failure)   . Asthma   . Heart disease   . Old myocardial infarction   . Anginal pain   . GERD (gastroesophageal reflux disease)    Past Surgical History  Procedure Laterality Date  . Circumcision    . Stents    . Esophagogastroduodenoscopy      in [place] over 20 yrs ago  . Colonoscopy      In [facility], approximately 2011 per patient, was normal. Advised to come back in 10 years.  . Esophagogastroduodenoscopy  06/12/2012    Procedure: ESOPHAGOGASTRODUODENOSCOPY (EGD);  Surgeon: Daneil Dolin, MD;  [facility];  Service: Endoscopy;  Laterality: N/A;  9:45  . Cardiac catheterization    . Subxyphoid pericardial window N/A 06/23/2013    Procedure: SUBXYPHOID PERICARDIAL WINDOW;  Surgeon: Lilia Argue  Servando Snare, MD;  Location: [place];  Service: Thoracic;  Laterality: N/A;  . Intraoperative transesophageal echocardiogram N/A 06/23/2013    Procedure: INTRAOPERATIVE TRANSESOPHAGEAL ECHOCARDIOGRAM;  Surgeon: Grace Isaac, MD;  Location: [place];  Service: Open Heart Surgery;  Laterality: N/A;   Family History  Problem Relation Age of Onset  . Diabetes Mother   . Heart attack Mother   . Stroke Mother   . Diabetes Sister   . Sleep apnea Sister   . Hypertension Brother   . Diabetes Brother   . Colon cancer Neg Hx   . Liver disease Neg Hx   . Diabetes Brother   . Hypertension Brother    History   Substance Use Topics  . Smoking status: Never Smoker   . Smokeless tobacco: Never Used  . Alcohol Use: No     Comment: heavy etoh use 30 years ago    Review of Systems  Constitutional: Negative for diaphoresis, appetite change and fatigue.  HENT: Negative for congestion, ear discharge and sinus pressure.   Eyes: Negative for discharge.  Respiratory: Positive for chest tightness. Negative for cough.   Cardiovascular: Positive for chest pain.  Gastrointestinal: Negative for abdominal pain and diarrhea.  Genitourinary: Negative for frequency and hematuria.  Musculoskeletal: Negative for back pain.  Skin: Negative for rash.  Neurological: Positive for headaches. Negative for seizures.  Psychiatric/Behavioral: Negative for hallucinations and confusion.      Allergies  Hydrocodone; Lisinopril; Neurontin; Statins; and Metformin and related  Home Medications   Prior to Admission medications   Medication Sig Start Date End Date Taking? Authorizing Provider  ALPRAZolam Duanne Moron) 0.5 MG tablet TAKE 1/2-1 TABLET BY MOUTH AT BEDTIME AS NEEDED 04/30/14   Mikey Kirschner, MD  aspirin EC 81 MG tablet Take 81 mg by mouth at bedtime.     Historical Provider, MD  BD PEN NEEDLE NANO U/F 32G X 4 MM MISC USE AS DIRECTED    [name], MD  budesonide-formoterol (SYMBICORT) 160-4.5 MCG/ACT inhaler Inhale 2 puffs into the lungs 2 (two) times daily.     Historical Provider, MD  clopidogrel (PLAVIX) 75 MG tablet Take 1 tablet (75 mg total) by mouth at bedtime. 02/19/14   [name], MD  fenofibrate 160 MG tablet Take 160 mg by mouth at bedtime.    Historical Provider, MD  hydrALAZINE (APRESOLINE) 25 MG tablet Take 1 tablet (25 mg total) by mouth 3 (three) times daily. 03/25/14   Herminio Commons, MD  insulin detemir (LEVEMIR) 100 UNIT/ML injection 80 units qhs 04/08/14   [name], MD  losartan (COZAAR) 100 MG tablet Take 1 tablet (100 mg total) by mouth daily. 02/19/14   [name], MD   methylcellulose (ARTIFICIAL TEARS) 1 % ophthalmic solution Place 2 drops into the left eye 2 (two) times daily as needed (dry eye).    Historical Provider, MD  metoprolol tartrate (LOPRESSOR) 25 MG tablet Take 1 tablet (25 mg total) by mouth 2 (two) times daily. 01/29/14   [name], MD  nitroGLYCERIN (NITROSTAT) 0.4 MG SL tablet Place 0.4 mg under the tongue every 5 (five) minutes as needed for chest pain.    Historical Provider, MD  potassium chloride SA (K-DUR,KLOR-CON) 20 MEQ tablet Take 1 tablet (20 mEq total) by mouth daily. 01/27/14   Kathie Dike, MD  torsemide (DEMADEX) 20 MG tablet Take 0.5-1 tablets (10-20 mg total) by mouth 2 (two) times daily. Take 1 tab po qam and 0.5 tab po  qpm 01/29/14   [name], MD  traMADol (ULTRAM) 50 MG tablet Take 50 mg by mouth every 6 (six) hours as needed.  12/09/13   Historical Provider, MD   Triage Vitals: BP 154/88  Pulse 94  Temp(Src) 98.3 F (36.8 C) (Oral)  Resp 18  Ht 6\' 1"  (1.854 m)  Wt 263 lb (119.296 kg)  BMI 34.71 kg/m2  SpO2 98% Physical Exam  Constitutional: He is oriented to person, place, and time. He appears well-developed.  HENT:  Head: Normocephalic.  Eyes: Conjunctivae and EOM are normal. No scleral icterus.  Neck: Neck supple. No thyromegaly present.  Cardiovascular: Normal rate and regular rhythm.  Exam reveals no gallop and no friction rub.   No murmur heard. Pulmonary/Chest: No stridor. He has no wheezes. He has no rales. He exhibits no tenderness.  Abdominal: He exhibits no distension. There is no tenderness. There is no rebound.  Musculoskeletal: Normal range of motion. He exhibits no edema.  Lymphadenopathy:    He has no cervical adenopathy.  Neurological: He is oriented to person, place, and time. He exhibits normal muscle tone. Coordination normal.  Skin: No rash noted. No erythema.  Psychiatric: He has a normal mood and affect. His behavior is normal.    ED Course  Procedures (including critical care  time) DIAGNOSTIC STUDIES: Oxygen Saturation is 98% on RA, nl by my interpretation.    COORDINATION OF CARE:  Labs Review Labs Reviewed  CBC  BASIC METABOLIC PANEL  TROPONIN I    Imaging Review No results found.   EKG Interpretation None      MDM   Final diagnoses:  None    Chest pain resolved.  Nl studies.  Pt to follow with pcp this week.   The chart was scribed for me under my direct supervision.  I personally performed the history, physical, and medical decision making and all procedures in the evaluation of this patient.Maudry Diego, MD 05/08/14 [phone]

## 2014-05-08 NOTE — ED Notes (Signed)
Pt reporting easing of symptoms following SL nitro.

## 2014-05-08 NOTE — Discharge Instructions (Signed)
Follow up with your md as planned this week.   Return if any problems

## 2014-05-08 NOTE — ED Notes (Signed)
Pt reports chest tightness,headache after verbal altercation with spouse. Pt reports MI in the past with no chest pain at that time.nad noted.

## 2014-05-10 ENCOUNTER — Encounter: Payer: Self-pay | Admitting: *Deleted

## 2014-05-10 LAB — HEMOGLOBIN A1C: HEMOGLOBIN A1C: 5.6 % (ref 4.0–6.0)

## 2014-05-11 ENCOUNTER — Ambulatory Visit (INDEPENDENT_AMBULATORY_CARE_PROVIDER_SITE_OTHER): Payer: Medicare PPO | Admitting: Family Medicine

## 2014-05-11 ENCOUNTER — Encounter: Payer: Self-pay | Admitting: Family Medicine

## 2014-05-11 VITALS — BP 140/88 | Temp 98.7°F | Ht 73.0 in | Wt 263.0 lb

## 2014-05-11 DIAGNOSIS — Z125 Encounter for screening for malignant neoplasm of prostate: Secondary | ICD-10-CM

## 2014-05-11 DIAGNOSIS — Z23 Encounter for immunization: Secondary | ICD-10-CM

## 2014-05-11 DIAGNOSIS — G629 Polyneuropathy, unspecified: Secondary | ICD-10-CM

## 2014-05-11 DIAGNOSIS — E1129 Type 2 diabetes mellitus with other diabetic kidney complication: Secondary | ICD-10-CM

## 2014-05-11 DIAGNOSIS — E1122 Type 2 diabetes mellitus with diabetic chronic kidney disease: Secondary | ICD-10-CM

## 2014-05-11 DIAGNOSIS — E785 Hyperlipidemia, unspecified: Secondary | ICD-10-CM

## 2014-05-11 DIAGNOSIS — G609 Hereditary and idiopathic neuropathy, unspecified: Secondary | ICD-10-CM

## 2014-05-11 DIAGNOSIS — N23 Unspecified renal colic: Secondary | ICD-10-CM

## 2014-05-11 DIAGNOSIS — N189 Chronic kidney disease, unspecified: Secondary | ICD-10-CM

## 2014-05-11 LAB — POCT URINALYSIS DIPSTICK
GLUCOSE UA: 50
PH UA: 5
Protein, UA: 100
Spec Grav, UA: 1.02

## 2014-05-11 NOTE — Progress Notes (Signed)
   Subjective:    Patient ID: Eugene [name], male    DOB: Jan 24, 1956, 58 y.o.   MRN: [id number]  Diabetes He presents for his follow-up diabetic visit. He has type 2 diabetes mellitus. Pertinent negatives for hypoglycemia include no confusion. Pertinent negatives for diabetes include no chest pain, no fatigue, no polydipsia, no polyphagia and no weakness. He is compliant with treatment all of the time. He is currently taking insulin at bedtime. Insulin injections are given by patient. He is following a diabetic diet. Exercise: 3 -4 times weekly. His breakfast blood glucose range is generally 130-140 mg/dl. (150) He does not see a podiatrist.Eye exam is current.   Having pain in kidney area. Odor to urine.  Urinalysis looked normal under the microscope  Review of Systems  Constitutional: Negative for activity change, appetite change and fatigue.  HENT: Negative for congestion.   Respiratory: Negative for cough.   Cardiovascular: Negative for chest pain.  Gastrointestinal: Negative for abdominal pain.  Endocrine: Negative for polydipsia and polyphagia.  Neurological: Negative for weakness.  Psychiatric/Behavioral: Negative for confusion.       Objective:   Physical Exam  Vitals reviewed. Constitutional: He appears well-nourished. No distress.  Cardiovascular: Normal rate, regular rhythm and normal heart sounds.   No murmur heard. Pulmonary/Chest: Effort normal and breath sounds normal. No respiratory distress.  Genitourinary: Prostate normal.  Musculoskeletal: He exhibits no edema.  Lymphadenopathy:    He has no cervical adenopathy.  Neurological: He is alert.  Psychiatric: His behavior is normal.    Prostate exam normal      Assessment & Plan:  He has concentrated urine he is concerned about infection we'll do a urine culture  He is due for his PSA  Blood pressure under decent control continue current measures  He does need to check his lipid profile does not  tolerate statin  His diabetes under much better control continue current measures.  Followup 3 months

## 2014-05-12 LAB — LIPID PANEL
Cholesterol: 273 mg/dL — ABNORMAL HIGH (ref 0–200)
HDL: 36 mg/dL — AB (ref >39)
TRIGLYCERIDES: 819 mg/dL — AB (ref <150)
Total CHOL/HDL Ratio: 7.6 Ratio

## 2014-05-13 LAB — URINE CULTURE
Colony Count: NO GROWTH
ORGANISM ID, BACTERIA: NO GROWTH

## 2014-05-13 LAB — PSA: PSA: 0.65 ng/mL (ref <=4.00)

## 2014-05-28 ENCOUNTER — Telehealth: Payer: Self-pay | Admitting: Family Medicine

## 2014-05-28 NOTE — Telephone Encounter (Signed)
This patient contacted the nurse care manager regarding his sugars being elevated. The patient noted that I would prefer that he will try some of his readings down on paper including if they were morning readings or later in the day and include the dates. Plus include how much insulin he is taking. Then we can advise adjustment in the dose to try to get his sugars under better control.

## 2014-05-28 NOTE — Telephone Encounter (Signed)
Patient stated he was on Levemir that was not controlling sugars so they switched back to Lantus a few days ago and sugars are much better and the social worker trying to get him his lantus free for the rest of the year.

## 2014-06-02 ENCOUNTER — Telehealth: Payer: Self-pay | Admitting: *Deleted

## 2014-06-02 NOTE — Telephone Encounter (Signed)
Please call pt find out how often pt is taking tramadol so we know how to order ( should not exceed 3 times a day and should only be used whenn needed for pain )

## 2014-06-02 NOTE — Telephone Encounter (Signed)
Request from [facility] for tramadol 50mg . Last seen 05/11/14.

## 2014-06-03 MED ORDER — TRAMADOL HCL 50 MG PO TABS: 50.0000 mg | ORAL_TABLET | Freq: Four times a day (QID) | ORAL | Status: DC | PRN

## 2014-06-03 NOTE — Telephone Encounter (Signed)
I assume that's a 3 month supply, if it is then reorder with 2 refills

## 2014-06-03 NOTE — Telephone Encounter (Signed)
From history he normally gets 24-30 pills in an Rx ir says q6 hrs as needed for pain

## 2014-06-03 NOTE — Telephone Encounter (Signed)
Rx faxed to pharmacy  

## 2014-06-10 ENCOUNTER — Ambulatory Visit: Payer: Medicare PPO

## 2014-06-15 ENCOUNTER — Ambulatory Visit (INDEPENDENT_AMBULATORY_CARE_PROVIDER_SITE_OTHER): Payer: Medicare PPO | Admitting: *Deleted

## 2014-06-15 DIAGNOSIS — Z23 Encounter for immunization: Secondary | ICD-10-CM

## 2014-06-22 ENCOUNTER — Telehealth: Payer: Self-pay | Admitting: Family Medicine

## 2014-06-22 NOTE — Telephone Encounter (Signed)
Patient got approved for lantus insulin so the company that is helping him with this will be sending the lantus product to our office and Stoney will check back at the end of the week to see if we have received this for him to pick up.  This is just to notify the nurses.

## 2014-06-22 NOTE — Telephone Encounter (Signed)
Please let make sure that the front and the nurses watch out for this medication so it may be given to the patient. I am concerned it could get lost

## 2014-06-25 ENCOUNTER — Encounter: Payer: Self-pay | Admitting: Cardiovascular Disease

## 2014-06-25 ENCOUNTER — Ambulatory Visit (INDEPENDENT_AMBULATORY_CARE_PROVIDER_SITE_OTHER): Payer: Medicare HMO | Admitting: Cardiovascular Disease

## 2014-06-25 VITALS — BP 158/80 | HR 88 | Ht 73.0 in | Wt 269.0 lb

## 2014-06-25 DIAGNOSIS — G4733 Obstructive sleep apnea (adult) (pediatric): Secondary | ICD-10-CM

## 2014-06-25 DIAGNOSIS — I319 Disease of pericardium, unspecified: Secondary | ICD-10-CM

## 2014-06-25 DIAGNOSIS — Z955 Presence of coronary angioplasty implant and graft: Secondary | ICD-10-CM

## 2014-06-25 DIAGNOSIS — I10 Essential (primary) hypertension: Secondary | ICD-10-CM

## 2014-06-25 DIAGNOSIS — I2510 Atherosclerotic heart disease of native coronary artery without angina pectoris: Secondary | ICD-10-CM

## 2014-06-25 DIAGNOSIS — N183 Chronic kidney disease, stage 3 unspecified: Secondary | ICD-10-CM

## 2014-06-25 DIAGNOSIS — R600 Localized edema: Secondary | ICD-10-CM

## 2014-06-25 DIAGNOSIS — E782 Mixed hyperlipidemia: Secondary | ICD-10-CM

## 2014-06-25 DIAGNOSIS — I3139 Other pericardial effusion (noninflammatory): Secondary | ICD-10-CM

## 2014-06-25 DIAGNOSIS — E1121 Type 2 diabetes mellitus with diabetic nephropathy: Secondary | ICD-10-CM

## 2014-06-25 DIAGNOSIS — R0609 Other forms of dyspnea: Secondary | ICD-10-CM

## 2014-06-25 DIAGNOSIS — J449 Chronic obstructive pulmonary disease, unspecified: Secondary | ICD-10-CM

## 2014-06-25 DIAGNOSIS — I313 Pericardial effusion (noninflammatory): Secondary | ICD-10-CM

## 2014-06-25 DIAGNOSIS — I5032 Chronic diastolic (congestive) heart failure: Secondary | ICD-10-CM

## 2014-06-25 MED ORDER — AMLODIPINE BESYLATE 5 MG PO TABS: 5.0000 mg | ORAL_TABLET | Freq: Every day | ORAL | Status: DC

## 2014-06-25 MED ORDER — ROSUVASTATIN CALCIUM 10 MG PO TABS: 10.0000 mg | ORAL_TABLET | Freq: Every day | ORAL | Status: DC

## 2014-06-25 NOTE — Progress Notes (Signed)
Patient ID: Eugene [name], male   DOB: 07-21-1956, 58 y.o.   MRN: [id number]      SUBJECTIVE: The patient returns for routine cardiovascular follow-up. He was evaluated for chest pain in the emergency room on 05/08/2014. He had two normal troponins. Chest x-ray showed cardiomegaly without heart failure. ECG demonstrated sinus rhythm with an old inferior infarct and incomplete right bundle-branch block. On 05/12/2014, a lipid panel demonstrated total cholesterol 273, triglycerides 819, HDL 36, and LDL could not be calculated. He also has a history of chronic diastolic heart failure, insulin-dependent diabetes mellitus, hypertension, coronary artery disease, and COPD. An echocardiogram dated 03/03/2014 demonstrated vigorous left ventricular systolic function, EF [id number], moderate to severe LVH, mild to moderately dilated aortic root 4.5 cm, and a stable moderate sized pericardial effusion with no evidence of tamponade physiology. He also has coronary artery disease and COPD.  He said that his chest pain which led to an emergency room visit was due to a disagreement with his wife. He very seldom has chest pain. He has been more short of breath last 3 days. He describes NYHA class II-III symptoms. He said when he eats he becomes more short of breath. He said he had his esophagus dilated approximately 2 years ago. Blood pressures at home have been primarily in the [id number] systolic range.   Review of Systems: As per "subjective", otherwise negative.  Allergies  Allergen Reactions  . Hydrocodone Nausea And Vomiting  . Lisinopril Cough  . Neurontin [Gabapentin]     Side effects, suicidal thoughts  . Statins Other (See Comments)    Muscle aches  . Metformin And Related     Intestinal side effects    Current Outpatient Prescriptions  Medication Sig Dispense Refill  . ALPRAZolam (XANAX) 0.5 MG tablet Take 0.5 mg by mouth at bedtime as needed for sleep.      [name] aspirin EC 81 MG tablet Take 81 mg  by mouth at bedtime.       . BD PEN NEEDLE NANO U/F 32G X 4 MM MISC       . budesonide-formoterol (SYMBICORT) 160-4.5 MCG/ACT inhaler Inhale 2 puffs into the lungs 2 (two) times daily.       . clopidogrel (PLAVIX) 75 MG tablet Take 1 tablet (75 mg total) by mouth at bedtime.  90 tablet  1  . EASY TOUCH TEST test strip       . fenofibrate 160 MG tablet Take 160 mg by mouth at bedtime.      . hydrALAZINE (APRESOLINE) 25 MG tablet Take 37.5 mg by mouth 2 (two) times daily.      . insulin detemir (LEVEMIR) 100 UNIT/ML injection Inject 80 Units into the skin at bedtime.      [name] LANTUS SOLOSTAR 100 UNIT/ML Solostar Pen       . losartan (COZAAR) 100 MG tablet Take 1 tablet (100 mg total) by mouth daily.  90 tablet  1  . methylcellulose (ARTIFICIAL TEARS) 1 % ophthalmic solution Place 2 drops into the left eye 2 (two) times daily as needed (dry eye).      . metoprolol tartrate (LOPRESSOR) 25 MG tablet Take 1 tablet (25 mg total) by mouth 2 (two) times daily.  60 tablet  3  . nitroGLYCERIN (NITROSTAT) 0.4 MG SL tablet Place 0.4 mg under the tongue every 5 (five) minutes as needed for chest pain.      . potassium chloride SA (K-DUR,KLOR-CON) 20 MEQ tablet Take 1 tablet (20 mEq total)  by mouth daily.  30 tablet  0  . torsemide (DEMADEX) 20 MG tablet Take 0.5-1 tablets (10-20 mg total) by mouth 2 (two) times daily. Take 1 tab po qam and 0.5 tab po qpm  45 tablet  1  . traMADol (ULTRAM) 50 MG tablet Take 1 tablet (50 mg total) by mouth every 6 (six) hours as needed (pain).  30 tablet  0   No current facility-administered medications for this visit.    Past Medical History  Diagnosis Date  . Arteriosclerotic cardiovascular disease (ASCVD)     a. 05/2011 Cath/PCI: LM nl, LAD 77m, D1 small, D2 small 25m, LCX large 40p, RCA 50-60p, 99 hazy @ origin of PDA with 70-80 in PDA (2.5x26 Resolute Integrity & 3.0x15 Resolute Integrity DES).;  b. 08/2012 Inflat  STEMI/Cath/PCI: LM minor irregs, LAD 50p, D1 50, LCX nl,  OM1 25, RCA 30-40p, 100d (treated with 2.75x68mm Promus Premier DES);  c. 08/2012 Echo: EF 55-60%, basal inferopost HK.  [name] Hyperlipidemia   . Diabetes mellitus     Peripheral neuropathy  . Bell palsy   . Hypertension   . COPD (chronic obstructive pulmonary disease)   . Sleep apnea   . Gallstones   . Cholelithiasis 07/2012    Asymptomatic; identified incidentally  . PONV (postoperative nausea and vomiting)   . Anxiety   . C. difficile colitis     a. 08/2012  . Myocardial infarct 09/08/12  . Nephrolithiasis   . Contrast dye induced nephropathy     a. 08/2012 post cath/pci  . CHF (congestive heart failure)   . Asthma   . Heart disease   . Old myocardial infarction   . Anginal pain   . GERD (gastroesophageal reflux disease)     Past Surgical History  Procedure Laterality Date  . Circumcision    . Stents    . Esophagogastroduodenoscopy      in [place] over 20 yrs ago  . Colonoscopy      In [facility], approximately 2011 per patient, was normal. Advised to come back in 10 years.  . Esophagogastroduodenoscopy  06/12/2012    Procedure: ESOPHAGOGASTRODUODENOSCOPY (EGD);  Surgeon: Daneil Dolin, MD;  [facility];  Service: Endoscopy;  Laterality: N/A;  9:45  . Cardiac catheterization    . Subxyphoid pericardial window N/A 06/23/2013    Procedure: SUBXYPHOID PERICARDIAL WINDOW;  Surgeon: Grace Isaac, MD;  Location: [place];  Service: Thoracic;  Laterality: N/A;  . Intraoperative transesophageal echocardiogram N/A 06/23/2013    Procedure: INTRAOPERATIVE TRANSESOPHAGEAL ECHOCARDIOGRAM;  Surgeon: Grace Isaac, MD;  Location: [place];  Service: Open Heart Surgery;  Laterality: N/A;    History   Social History  . Marital Status: Married    Spouse Name: N/A    Number of Children: 2  . Years of Education: N/A   Occupational History  . Shipping     [employer]   Social History Main Topics  . Smoking status: Never Smoker   . Smokeless tobacco:  Never Used  . Alcohol Use: No     Comment: heavy etoh use 30 years ago  . Drug Use: No  . Sexual Activity: Yes    Birth Control/ Protection: None   Other Topics Concern  . 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 Disabled at this point.    BP 158/80 Pulse 88  Weight 269 lb (122.018 kg) Height 6\' 1"  (1.854  m)    PHYSICAL EXAM General: NAD HEENT: Normal. Neck: No JVD, no thyromegaly. Lungs: Clear to auscultation bilaterally with normal respiratory effort. CV: Nondisplaced PMI.  Regular rate and rhythm, normal S1/S2, no S3/S4, no murmur. Trace pretibial and periankle edema.  No carotid bruit.  Normal pedal pulses.  Abdomen: Firm, obese, nontender.  Neurologic: Alert and oriented x 3.  Psych: Normal affect. Skin: Normal. Musculoskeletal: Normal range of motion, no gross deformities. Extremities: No clubbing or cyanosis.   ECG: Most recent ECG reviewed.      ASSESSMENT AND PLAN: 1. CAD s/p multiple PCI's to RCA: Stable ischemic heart disease. Continue aspirin, metoprolol, and Plavix. Will attempt Crestor 10 mg daily.  2. Essential HTN: Uncontrolled today. Has been taking hydralazine 37.5 mg bid along with losartan 100 mg daily. Will add amlodipine 5 mg daily. 3. Chronic diastolic heart failure: Euvolemic.Continue present diuretic regimen of torsemide 20 mg q am and 10 mg q pm. Continue low-sodium and fluid-restricted diet. Will optimize BP control with addition of amlodipine. 4. Hyperlipidemia: Markedly elevated lipids in context of diabetes and CAD. Intolerant to statins (muscle aches). Will try Crestor 10 mg daily and repeat lipids in 3 months. Continue flaxseed oil, fenofibrate, and therapeutic lifestyle changes.  5. Pericardial effusion s/p pericardial window: Moderate in size and stable. Will repeat a limited echo to reassess.  6. COPD: Relatively stable on Symbicort.  7. Type 2 diabetes: Most recent HbA1C 5.6%,  demonstrating good control. No changes to therapy.  Dispo: f/u in 4 months.  Kate Sable, M.D., F.A.C.C.

## 2014-06-25 NOTE — Patient Instructions (Signed)
   Begin Crestor 10mg  daily  Begin Amlodipine 5mg  daily   New prescriptions sent to pharm on above. Continue all other medications.   Follow up in  3-4 months

## 2014-06-29 ENCOUNTER — Telehealth: Payer: Self-pay | Admitting: Internal Medicine

## 2014-06-29 NOTE — Telephone Encounter (Signed)
PATIENT HAVING DIFFICULTY SWALLOWING, EATING MAKES HIS NOSE RUN AND COUGH.  SOB.  MADE APPT WITH FIRST OPENING.  PLEASE ADVISE IF WE CAN GET HIM IN SOONER.  [phone]

## 2014-06-29 NOTE — Telephone Encounter (Signed)
Tried to call pt- LMOM 

## 2014-06-30 ENCOUNTER — Telehealth: Payer: Self-pay | Admitting: Internal Medicine

## 2014-06-30 NOTE — Telephone Encounter (Signed)
Patient called today saying that JL had left him a message yesterday. I told him that she would be in later this afternoon and let her know that he had returned her call. Please call him at [phone]

## 2014-06-30 NOTE — Telephone Encounter (Signed)
I spoke with the pt. See other phone note. 

## 2014-06-30 NOTE — Telephone Encounter (Signed)
Spoke with the pt. He said for 6 months he has been having episodes where he gets choked on his food and starts coughing and then his nose starts running. He also has some nausea when he eats. He went to cardiology recently and they told him it wasn't heart related and he needed to see [place].   Erline Levine, please get pt appt sooner. Thanks.

## 2014-06-30 NOTE — Telephone Encounter (Signed)
Had a cancel so I moved his appointment to 11/09

## 2014-07-01 ENCOUNTER — Other Ambulatory Visit: Payer: Self-pay | Admitting: *Deleted

## 2014-07-01 ENCOUNTER — Ambulatory Visit (HOSPITAL_COMMUNITY)
Admission: RE | Admit: 2014-07-01 | Discharge: 2014-07-01 | Disposition: A | Discharge status: Home / Self Care | Payer: Medicare HMO | Source: Ambulatory Visit | Attending: Internal Medicine | Admitting: Internal Medicine

## 2014-07-01 ENCOUNTER — Other Ambulatory Visit: Payer: Self-pay

## 2014-07-01 DIAGNOSIS — R1314 Dysphagia, pharyngoesophageal phase: Secondary | ICD-10-CM | POA: Diagnosis not present

## 2014-07-01 DIAGNOSIS — R0600 Dyspnea, unspecified: Secondary | ICD-10-CM

## 2014-07-01 DIAGNOSIS — R0602 Shortness of breath: Secondary | ICD-10-CM | POA: Diagnosis not present

## 2014-07-01 DIAGNOSIS — R1012 Left upper quadrant pain: Secondary | ICD-10-CM | POA: Insufficient documentation

## 2014-07-01 DIAGNOSIS — I313 Pericardial effusion (noninflammatory): Secondary | ICD-10-CM

## 2014-07-01 DIAGNOSIS — I3139 Other pericardial effusion (noninflammatory): Secondary | ICD-10-CM

## 2014-07-01 NOTE — Telephone Encounter (Signed)
Routing to Ginger.  

## 2014-07-01 NOTE — Telephone Encounter (Signed)
He had a normal esophagus when he was dilated in 2013. Once get a barium pill esophagram before his upcoming office visit

## 2014-07-01 NOTE — Telephone Encounter (Signed)
noted 

## 2014-07-01 NOTE — Telephone Encounter (Addendum)
Pt is set up for BPE on 07/01/14 @ 1:45. PER TRINY NO PA NEEDED. Pt is aware

## 2014-07-02 ENCOUNTER — Other Ambulatory Visit (HOSPITAL_COMMUNITY): Payer: Commercial Managed Care - HMO

## 2014-07-03 ENCOUNTER — Inpatient Hospital Stay (HOSPITAL_COMMUNITY)
Admission: EM | Admit: 2014-07-03 | Discharge: 2014-07-05 | DRG: 292 | Disposition: A | Discharge status: Home / Self Care | Payer: Medicare HMO | Attending: Internal Medicine | Admitting: Internal Medicine

## 2014-07-03 ENCOUNTER — Encounter (HOSPITAL_COMMUNITY): Payer: Self-pay | Admitting: *Deleted

## 2014-07-03 ENCOUNTER — Emergency Department (HOSPITAL_COMMUNITY): Payer: Medicare HMO

## 2014-07-03 DIAGNOSIS — I5033 Acute on chronic diastolic (congestive) heart failure: Secondary | ICD-10-CM | POA: Diagnosis not present

## 2014-07-03 DIAGNOSIS — R0602 Shortness of breath: Secondary | ICD-10-CM | POA: Diagnosis not present

## 2014-07-03 DIAGNOSIS — I252 Old myocardial infarction: Secondary | ICD-10-CM

## 2014-07-03 DIAGNOSIS — E1140 Type 2 diabetes mellitus with diabetic neuropathy, unspecified: Secondary | ICD-10-CM | POA: Diagnosis present

## 2014-07-03 DIAGNOSIS — K219 Gastro-esophageal reflux disease without esophagitis: Secondary | ICD-10-CM | POA: Diagnosis present

## 2014-07-03 DIAGNOSIS — Z8249 Family history of ischemic heart disease and other diseases of the circulatory system: Secondary | ICD-10-CM

## 2014-07-03 DIAGNOSIS — N189 Chronic kidney disease, unspecified: Secondary | ICD-10-CM

## 2014-07-03 DIAGNOSIS — Z794 Long term (current) use of insulin: Secondary | ICD-10-CM

## 2014-07-03 DIAGNOSIS — Z7982 Long term (current) use of aspirin: Secondary | ICD-10-CM

## 2014-07-03 DIAGNOSIS — R079 Chest pain, unspecified: Secondary | ICD-10-CM

## 2014-07-03 DIAGNOSIS — Z7902 Long term (current) use of antithrombotics/antiplatelets: Secondary | ICD-10-CM

## 2014-07-03 DIAGNOSIS — N179 Acute kidney failure, unspecified: Secondary | ICD-10-CM | POA: Diagnosis present

## 2014-07-03 DIAGNOSIS — J449 Chronic obstructive pulmonary disease, unspecified: Secondary | ICD-10-CM | POA: Diagnosis present

## 2014-07-03 DIAGNOSIS — E1149 Type 2 diabetes mellitus with other diabetic neurological complication: Secondary | ICD-10-CM | POA: Diagnosis present

## 2014-07-03 DIAGNOSIS — I10 Essential (primary) hypertension: Secondary | ICD-10-CM

## 2014-07-03 DIAGNOSIS — R609 Edema, unspecified: Secondary | ICD-10-CM

## 2014-07-03 DIAGNOSIS — E785 Hyperlipidemia, unspecified: Secondary | ICD-10-CM | POA: Diagnosis present

## 2014-07-03 DIAGNOSIS — Z9861 Coronary angioplasty status: Secondary | ICD-10-CM

## 2014-07-03 DIAGNOSIS — J45909 Unspecified asthma, uncomplicated: Secondary | ICD-10-CM | POA: Diagnosis present

## 2014-07-03 DIAGNOSIS — I2510 Atherosclerotic heart disease of native coronary artery without angina pectoris: Secondary | ICD-10-CM | POA: Diagnosis present

## 2014-07-03 DIAGNOSIS — Z823 Family history of stroke: Secondary | ICD-10-CM

## 2014-07-03 DIAGNOSIS — I129 Hypertensive chronic kidney disease with stage 1 through stage 4 chronic kidney disease, or unspecified chronic kidney disease: Secondary | ICD-10-CM | POA: Diagnosis present

## 2014-07-03 DIAGNOSIS — N183 Chronic kidney disease, stage 3 (moderate): Secondary | ICD-10-CM | POA: Diagnosis present

## 2014-07-03 DIAGNOSIS — G4733 Obstructive sleep apnea (adult) (pediatric): Secondary | ICD-10-CM | POA: Diagnosis present

## 2014-07-03 DIAGNOSIS — Z79899 Other long term (current) drug therapy: Secondary | ICD-10-CM

## 2014-07-03 DIAGNOSIS — Z833 Family history of diabetes mellitus: Secondary | ICD-10-CM

## 2014-07-03 LAB — COMPREHENSIVE METABOLIC PANEL
ALT: 26 U/L (ref 0–53)
ANION GAP: 13 (ref 5–15)
AST: 31 U/L (ref 0–37)
Albumin: 3.6 g/dL (ref 3.5–5.2)
Alkaline Phosphatase: 61 U/L (ref 39–117)
BUN: 37 mg/dL — ABNORMAL HIGH (ref 6–23)
CALCIUM: 9.6 mg/dL (ref 8.4–10.5)
CO2: 24 mEq/L (ref 19–32)
CREATININE: 1.72 mg/dL — AB (ref 0.50–1.35)
Chloride: 99 mEq/L (ref 96–112)
GFR calc non Af Amer: 42 mL/min — ABNORMAL LOW (ref >90)
GFR, EST AFRICAN AMERICAN: 49 mL/min — AB (ref >90)
GLUCOSE: 195 mg/dL — AB (ref 70–99)
Potassium: 4.8 mEq/L (ref 3.7–5.3)
SODIUM: 136 meq/L — AB (ref 137–147)
TOTAL PROTEIN: 7.1 g/dL (ref 6.0–8.3)
Total Bilirubin: 0.3 mg/dL (ref 0.3–1.2)

## 2014-07-03 LAB — CBC WITH DIFFERENTIAL/PLATELET
Basophils Absolute: 0 10*3/uL (ref 0.0–0.1)
Basophils Relative: 0 % (ref 0–1)
EOS PCT: 2 % (ref 0–5)
Eosinophils Absolute: 0.1 10*3/uL (ref 0.0–0.7)
HCT: 32.3 % — ABNORMAL LOW (ref 39.0–52.0)
Hemoglobin: 11.1 g/dL — ABNORMAL LOW (ref 13.0–17.0)
LYMPHS ABS: 1.6 10*3/uL (ref 0.7–4.0)
Lymphocytes Relative: 24 % (ref 12–46)
MCH: 30.4 pg (ref 26.0–34.0)
MCHC: 34.4 g/dL (ref 30.0–36.0)
MCV: 88.5 fL (ref 78.0–100.0)
MONO ABS: 0.6 10*3/uL (ref 0.1–1.0)
Monocytes Relative: 9 % (ref 3–12)
Neutro Abs: 4.2 10*3/uL (ref 1.7–7.7)
Neutrophils Relative %: 65 % (ref 43–77)
PLATELETS: 213 10*3/uL (ref 150–400)
RBC: 3.65 MIL/uL — ABNORMAL LOW (ref 4.22–5.81)
RDW: 12.9 % (ref 11.5–15.5)
WBC: 6.5 10*3/uL (ref 4.0–10.5)

## 2014-07-03 LAB — TROPONIN I: Troponin I: <0.3 ng/mL (ref <0.30)

## 2014-07-03 LAB — PRO B NATRIURETIC PEPTIDE: PRO B NATRI PEPTIDE: 351.9 pg/mL — AB (ref 0–125)

## 2014-07-03 LAB — LIPASE, BLOOD: LIPASE: 16 U/L (ref 11–59)

## 2014-07-03 MED ORDER — GI COCKTAIL ~~LOC~~
30.0000 mL | Freq: Four times a day (QID) | ORAL | Status: DC | PRN
Start: 2014-07-03 — End: 2014-07-05

## 2014-07-03 MED ORDER — TORSEMIDE 50 MG/5ML IV SOLN
20.0000 mg | Freq: Two times a day (BID) | INTRAVENOUS | Status: DC
  Filled 2014-07-03 (×4): qty 2

## 2014-07-03 MED ORDER — INSULIN ASPART 100 UNIT/ML ~~LOC~~ SOLN
0.0000 [IU] | Freq: Three times a day (TID) | SUBCUTANEOUS | Status: DC
  Administered 2014-07-04: 2 [IU] via SUBCUTANEOUS
  Administered 2014-07-04 (×2): 8 [IU] via SUBCUTANEOUS

## 2014-07-03 MED ORDER — CLOPIDOGREL BISULFATE 75 MG PO TABS
75.0000 mg | ORAL_TABLET | Freq: Every day | ORAL | Status: DC
  Administered 2014-07-04 (×2): 75 mg via ORAL
  Filled 2014-07-03 (×2): qty 1

## 2014-07-03 MED ORDER — ALPRAZOLAM 0.5 MG PO TABS: 0.5000 mg | ORAL_TABLET | Freq: Every evening | ORAL | Status: DC | PRN

## 2014-07-03 MED ORDER — AMLODIPINE BESYLATE 5 MG PO TABS
5.0000 mg | ORAL_TABLET | Freq: Every day | ORAL | Status: DC
  Administered 2014-07-04 – 2014-07-05 (×2): 5 mg via ORAL
  Filled 2014-07-03 (×2): qty 1

## 2014-07-03 MED ORDER — BUDESONIDE-FORMOTEROL FUMARATE 160-4.5 MCG/ACT IN AERO
INHALATION_SPRAY | RESPIRATORY_TRACT | Status: AC
  Filled 2014-07-03: qty 6

## 2014-07-03 MED ORDER — HEPARIN SODIUM (PORCINE) 5000 UNIT/ML IJ SOLN: 5000.0000 [IU] | Freq: Three times a day (TID) | INTRAMUSCULAR | Status: DC

## 2014-07-03 MED ORDER — INSULIN GLARGINE 100 UNIT/ML SOLOSTAR PEN: 40.0000 [IU] | PEN_INJECTOR | Freq: Every day | SUBCUTANEOUS | Status: DC

## 2014-07-03 MED ORDER — FUROSEMIDE 10 MG/ML IJ SOLN
80.0000 mg | Freq: Once | INTRAMUSCULAR | Status: AC
  Administered 2014-07-03: 80 mg via INTRAVENOUS
  Filled 2014-07-03: qty 8

## 2014-07-03 MED ORDER — INSULIN GLARGINE 100 UNIT/ML ~~LOC~~ SOLN
40.0000 [IU] | Freq: Every day | SUBCUTANEOUS | Status: DC
  Administered 2014-07-04: 40 [IU] via SUBCUTANEOUS
  Filled 2014-07-03 (×2): qty 0.4

## 2014-07-03 MED ORDER — ACETAMINOPHEN 325 MG PO TABS: 650.0000 mg | ORAL_TABLET | Freq: Four times a day (QID) | ORAL | Status: DC | PRN

## 2014-07-03 MED ORDER — SODIUM CHLORIDE 0.9 % IJ SOLN: 3.0000 mL | INTRAMUSCULAR | Status: DC | PRN

## 2014-07-03 MED ORDER — NITROGLYCERIN 0.4 MG SL SUBL
0.4000 mg | SUBLINGUAL_TABLET | SUBLINGUAL | Status: DC | PRN
  Administered 2014-07-03 (×2): 0.4 mg via SUBLINGUAL

## 2014-07-03 MED ORDER — BUDESONIDE-FORMOTEROL FUMARATE 160-4.5 MCG/ACT IN AERO
2.0000 | INHALATION_SPRAY | Freq: Two times a day (BID) | RESPIRATORY_TRACT | Status: DC
  Administered 2014-07-03 – 2014-07-05 (×4): 2 via RESPIRATORY_TRACT
  Filled 2014-07-03: qty 6

## 2014-07-03 MED ORDER — ACETAMINOPHEN 650 MG RE SUPP: 650.0000 mg | Freq: Four times a day (QID) | RECTAL | Status: DC | PRN

## 2014-07-03 MED ORDER — ROSUVASTATIN CALCIUM 10 MG PO TABS
10.0000 mg | ORAL_TABLET | Freq: Every day | ORAL | Status: DC
  Administered 2014-07-04 – 2014-07-05 (×2): 10 mg via ORAL
  Filled 2014-07-03 (×2): qty 1

## 2014-07-03 MED ORDER — ONDANSETRON HCL 4 MG PO TABS
4.0000 mg | ORAL_TABLET | Freq: Four times a day (QID) | ORAL | Status: DC | PRN
Start: 2014-07-03 — End: 2014-07-05

## 2014-07-03 MED ORDER — ASPIRIN 81 MG PO CHEW
324.0000 mg | CHEWABLE_TABLET | Freq: Once | ORAL | Status: AC
  Administered 2014-07-03: 324 mg via ORAL
  Filled 2014-07-03: qty 4

## 2014-07-03 MED ORDER — SODIUM CHLORIDE 0.9 % IJ SOLN
3.0000 mL | Freq: Two times a day (BID) | INTRAMUSCULAR | Status: DC
  Administered 2014-07-04 (×3): 3 mL via INTRAVENOUS

## 2014-07-03 MED ORDER — LOSARTAN POTASSIUM 50 MG PO TABS: 100.0000 mg | ORAL_TABLET | Freq: Every day | ORAL | Status: DC

## 2014-07-03 MED ORDER — LOSARTAN POTASSIUM 50 MG PO TABS
100.0000 mg | ORAL_TABLET | Freq: Every day | ORAL | Status: DC
  Administered 2014-07-04: 100 mg via ORAL
  Filled 2014-07-03: qty 2

## 2014-07-03 MED ORDER — METOPROLOL TARTRATE 25 MG PO TABS
25.0000 mg | ORAL_TABLET | Freq: Two times a day (BID) | ORAL | Status: DC
  Administered 2014-07-04 – 2014-07-05 (×4): 25 mg via ORAL
  Filled 2014-07-03 (×4): qty 1

## 2014-07-03 MED ORDER — ONDANSETRON HCL 4 MG/2ML IJ SOLN: 4.0000 mg | Freq: Four times a day (QID) | INTRAMUSCULAR | Status: DC | PRN

## 2014-07-03 MED ORDER — HYDRALAZINE HCL 25 MG PO TABS
37.5000 mg | ORAL_TABLET | Freq: Two times a day (BID) | ORAL | Status: DC
  Administered 2014-07-04 – 2014-07-05 (×4): 37.5 mg via ORAL
  Filled 2014-07-03 (×5): qty 2

## 2014-07-03 MED ORDER — SODIUM CHLORIDE 0.9 % IV SOLN: 250.0000 mL | INTRAVENOUS | Status: DC | PRN

## 2014-07-03 MED ORDER — HEPARIN SODIUM (PORCINE) 5000 UNIT/ML IJ SOLN
5000.0000 [IU] | Freq: Three times a day (TID) | INTRAMUSCULAR | Status: DC
  Administered 2014-07-04 – 2014-07-05 (×3): 5000 [IU] via SUBCUTANEOUS
  Filled 2014-07-03 (×4): qty 1

## 2014-07-03 MED ORDER — ASPIRIN EC 81 MG PO TBEC
81.0000 mg | DELAYED_RELEASE_TABLET | Freq: Every day | ORAL | Status: DC
  Administered 2014-07-04 (×2): 81 mg via ORAL
  Filled 2014-07-03 (×2): qty 1

## 2014-07-03 NOTE — ED Notes (Signed)
SL nitro not in ER pyxis. AC ware reported would bring dose down.

## 2014-07-03 NOTE — H&P (Addendum)
[facility] History and Physical  [name] [id number] DOB: [birth year] DOA: 07/03/2014  Referring physician: Dr. Betsey Holiday PCP: [name], MD   Chief Complaint: CP, SOB  HPI: Eugene [name] is a 58 y.o. male  Increasing SOB over the past few days. Associated w/ CP. Comes and goes. No change in nightly CPAP and able to sleep lying down. Tried increasing torsemide 20mg  BID (typicaly 20mg  QAM and 10mg  QHS). CP at rest or exertion. SOB and CP rlieved by nitro. H/o MI w/o any CP but SOB. BM daily.    Review of Systems:  Constitutional:  No weight loss, night sweats, Fevers, chills, fatigue.  HEENT:  No headaches, Tooth/dental problems,Sore throat,  No sneezing, itching, ear ache, nasal congestion, post nasal drip,  Cardio-vascular:  Per HPI GI:  No heartburn, indigestion, abdominal pain, nausea, vomiting, diarrhea, change in bowel habits, loss of appetite  Resp:  Per HPI Skin:  no rash or lesions.  GU:  no dysuria, change in color of urine, no urgency or frequency. No flank pain.  Musculoskeletal:  No decreased range of motion. No back pain.  Psych:  No change in mood or affect. No depression or anxiety. No memory loss.   Past Medical History  Diagnosis Date  . Arteriosclerotic cardiovascular disease (ASCVD)     a. 05/2011 Cath/PCI: LM nl, LAD 34m, D1 small, D2 small 60m, LCX large 40p, RCA 50-60p, 99 hazy @ origin of PDA with 70-80 in PDA (2.5x26 Resolute Integrity & 3.0x15 Resolute Integrity DES).;  b. 08/2012 Inflat  STEMI/Cath/PCI: LM minor irregs, LAD 50p, D1 50, LCX nl, OM1 25, RCA 30-40p, 100d (treated with 2.75x49mm Promus Premier DES);  c. 08/2012 Echo: EF 55-60%, basal inferopost HK.  [name] Hyperlipidemia   . Diabetes mellitus     Peripheral neuropathy  . Bell palsy   . Hypertension   . COPD (chronic obstructive pulmonary disease)   . Sleep apnea   . Gallstones   . Cholelithiasis 07/2012    Asymptomatic; identified incidentally  . PONV  (postoperative nausea and vomiting)   . Anxiety   . C. difficile colitis     a. 08/2012  . Myocardial infarct 09/08/12  . Nephrolithiasis   . Contrast dye induced nephropathy     a. 08/2012 post cath/pci  . CHF (congestive heart failure)   . Asthma   . Heart disease   . Old myocardial infarction   . Anginal pain   . GERD (gastroesophageal reflux disease)    Past Surgical History  Procedure Laterality Date  . Circumcision    . Stents    . Esophagogastroduodenoscopy      in [place] over 20 yrs ago  . Colonoscopy      In [facility], approximately 2011 per patient, was normal. Advised to come back in 10 years.  . Esophagogastroduodenoscopy  06/12/2012    [id number] esophagus-status post Venia Minks dilation. Abnormal gastric mucosa of uncertain significance-status post biopsy  . Cardiac catheterization    . Subxyphoid pericardial window N/A 06/23/2013    Procedure: SUBXYPHOID PERICARDIAL WINDOW;  Surgeon: Grace Isaac, MD;  Location: [place];  Service: Thoracic;  Laterality: N/A;  . Intraoperative transesophageal echocardiogram N/A 06/23/2013    Procedure: INTRAOPERATIVE TRANSESOPHAGEAL ECHOCARDIOGRAM;  Surgeon: Grace Isaac, MD;  Location: [place];  Service: Open Heart Surgery;  Laterality: N/A;   Social History:  reports that he has never smoked. He has never used smokeless tobacco. He reports that he does not drink alcohol or use  illicit drugs.  Allergies  Allergen Reactions  . Hydrocodone Nausea And Vomiting  . Lisinopril Cough  . Neurontin [Gabapentin] Other (See Comments)    Suicidal thoughts  . Statins Other (See Comments)    Muscle aches  . Metformin And Related Diarrhea and Other (See Comments)    Intestinal side effects    Family History  Problem Relation Age of Onset  . Diabetes Mother   . Heart attack Mother   . Stroke Mother   . Diabetes Sister   . Sleep apnea Sister   . Hypertension Brother   . Diabetes Brother   . Colon cancer Neg Hx   .  Liver disease Neg Hx   . Diabetes Brother   . Hypertension Brother      Prior to Admission medications   Medication Sig Start Date End Date Taking? Authorizing Provider  ALPRAZolam Duanne Moron) 0.5 MG tablet Take 0.5 mg by mouth at bedtime as needed for sleep.   Yes Historical Provider, MD  amLODipine (NORVASC) 5 MG tablet Take 1 tablet (5 mg total) by mouth daily. 06/25/14  Yes Herminio Commons, MD  aspirin EC 81 MG tablet Take 81 mg by mouth at bedtime.    Yes Historical Provider, MD  budesonide-formoterol (SYMBICORT) 160-4.5 MCG/ACT inhaler Inhale 2 puffs into the lungs 2 (two) times daily.    Yes Historical Provider, MD  clopidogrel (PLAVIX) 75 MG tablet Take 1 tablet (75 mg total) by mouth at bedtime. 02/19/14  Yes [name], MD  fenofibrate 160 MG tablet Take 160 mg by mouth at bedtime.   Yes Historical Provider, MD  hydrALAZINE (APRESOLINE) 25 MG tablet Take 37.5 mg by mouth 2 (two) times daily.   Yes Historical Provider, MD  LANTUS SOLOSTAR 100 UNIT/ML Solostar Pen Inject 80 Units into the skin at bedtime.  05/27/14  Yes Historical Provider, MD  losartan (COZAAR) 100 MG tablet Take 1 tablet (100 mg total) by mouth daily. 02/19/14  Yes [name], MD  metoprolol tartrate (LOPRESSOR) 25 MG tablet Take 1 tablet (25 mg total) by mouth 2 (two) times daily. 01/29/14  Yes [name], MD  rosuvastatin (CRESTOR) 10 MG tablet Take 1 tablet (10 mg total) by mouth daily. 06/25/14  Yes Herminio Commons, MD  torsemide (DEMADEX) 20 MG tablet Take 0.5-1 tablets (10-20 mg total) by mouth 2 (two) times daily. Take 1 tab po qam and 0.5 tab po qpm 01/29/14  Yes Scott A Luking, MD  insulin detemir (LEVEMIR) 100 UNIT/ML injection Inject 80 Units into the skin at bedtime.    Historical Provider, MD  methylcellulose (ARTIFICIAL TEARS) 1 % ophthalmic solution Place 2 drops into the left eye 2 (two) times daily as needed (dry eye).    Historical Provider, MD  nitroGLYCERIN (NITROSTAT) 0.4 MG SL tablet  Place 0.4 mg under the tongue every 5 (five) minutes as needed for chest pain.    Historical Provider, MD   Physical Exam: Filed Vitals:   07/03/14 1915 07/03/14 1930 07/03/14 1945 07/03/14 2000  BP: 148/73 135/71 129/72 138/73  Pulse: 75 73 69 68  Temp:      TempSrc:      Resp: 19 16 20 18   Height:      Weight:      SpO2: 100% 95% 96% 97%    Wt Readings from Last 3 Encounters:  07/03/14 123.378 kg (272 lb)  06/25/14 122.018 kg (269 lb)  05/11/14 119.296 kg (263 lb)    General: Appears  calm and comfortable Eyes: PERRL, normal lids, irises & conjunctiva ENT: grossly normal hearing, lips & tongue Neck: no LAD, masses or thyromegaly Cardiovascular: RRR, no m/r/g. 3+ LE edema Telemetry: SR, no arrhythmias  Respiratory: CTA bilaterally, no w/r/r. Normal respiratory effort. Abdomen: soft, nt, mildly distended Skin:  no rash or induration seen on limited exam Musculoskeletal:  grossly normal tone BUE/BLE, some muscle cramping when pt asked to sit for lung exam. Cramping in the L upper chest Psychiatric: grossly normal mood and affect, speech fluent and appropriate Neurologic: grossly non-focal.          Labs on Admission:  Basic Metabolic Panel:  Recent Labs Lab 07/03/14 1636  NA 136*  K 4.8  CL 99  CO2 24  GLUCOSE 195*  BUN 37*  CREATININE 1.72*  CALCIUM 9.6   Liver Function Tests:  Recent Labs Lab 07/03/14 1636  AST 31  ALT 26  ALKPHOS 61  BILITOT 0.3  PROT 7.1  ALBUMIN 3.6    Recent Labs Lab 07/03/14 1636  LIPASE 16   No results for input(s): AMMONIA in the last 168 hours. CBC:  Recent Labs Lab 07/03/14 1636  WBC 6.5  NEUTROABS 4.2  HGB 11.1*  HCT 32.3*  MCV 88.5  PLT 213   Cardiac Enzymes:  Recent Labs Lab 07/03/14 1636  TROPONINI <0.30    BNP (last 3 results)  Recent Labs  10/15/13 2015 01/25/14 2143 07/03/14 1636  PROBNP 221.3* 678.1* 351.9*   CBG: No results for input(s): GLUCAP in the last 168 hours.  Radiological  Exams on Admission: Dg Chest 2 View  07/03/2014   CLINICAL DATA:  SOB, swollen feet and ankles, chills, cough, hx of CHF, hx of prior heart attack  EXAM: CHEST  2 VIEW  COMPARISON:  05/08/2014.  FINDINGS: The heart size and mediastinal contours are within normal limits. Both lungs are clear. The bony thorax is intact.  IMPRESSION: No active cardiopulmonary disease.   Electronically Signed   By: Lajean Manes M.D.   On: 07/03/2014 17:26    EKG: Independently reviewed. NSR, no T wave abnormality or sign of ischemia, no change from previous  Assessment/Plan Principal Problem:   Chest pain Active Problems:   Hypertension   Hyperlipidemia   Obstructive sleep apnea- on C-pap   Edema   Type 2 diabetes mellitus with neurological manifestations   SOB (shortness of breath)   Chronic renal insufficiency, stage III (moderate)   Acute on chronic diastolic heart failure   Chest pain: States that this feels just like his previous MI. h/o CAD s/p MI and PCI. Troponin neg. CP resolved. Nitro w/ benefit. EKG w/o ischemia. Question MSK (as w/ some pain w/ movement), or GERD related as described at times as being epigastric - Admit for CP r/o - Obs - tele - cycle troponins - EKG in am - continue ASA and Plavix - GI cocktail  Acute exacerbation of chronic diastolic heart failure: BNP 351 - not particularly impressive. LE edema, 8lb wt gain over last few days, no response from torsemide, and increasing SOB concerning. Last Echo 02/2014 EF 65-70%. Lasix 80mg  IV given in ED w/o much response. - IV torsemide 20 BID - continue home ACEi and bblocker - strict I/O - daily wts  SOB: likely secondary to CHF exacerbation, vs MI. O2 nml. No sign of COPD exacerbation, PNA, PE. H/o OSA - CPAP at night - O2 [place] prn - continue symbicort  DM: last A1c 5.6 05/10/14. Lantus 80units daily at  home - Decrease lantus to 40 (consider increasing to home dose if glucose runs high) - SSI  HTN: At or near goal after  resting in ED - continue home norvasc, hydralazine, metoprolol  HLD:  - continue home Crestor  CKD III: Cr 1.72. Baseline around 1.65. Anticipate some elevation given diuresis - BMET in am   Code Status: FULL DVT Prophylaxis: Hep [place] TID Family Communication: none Disposition Plan: pending improvement  Time spent: >70 min in direct pt care and coordination.    [place], [facility] [email or web address] Password [email or web address]

## 2014-07-03 NOTE — ED Provider Notes (Addendum)
CSN: [id number]     Arrival date & time 07/03/14  1628 History  This chart was scribed for Eugene [name], * by Peyton Bottoms, ED Scribe. This patient was seen in room APA06/APA06 and the patient's care was started at 4:41 PM.   Chief Complaint  Patient presents with  . Shortness of Breath   Patient is a 58 y.o. male presenting with shortness of breath. The history is provided by the patient. No language interpreter was used.  Shortness of Breath Associated symptoms: cough    HPI Comments: Camarion Dillow Lore is a 58 y.o. male with a history of ASCVD, hyperlipidemia, diabetes, hypertension, heart disease, anginal pain, old MI, who presents to the Emergency Department complaining of shortness of breath and chest tightness that began 1.5 hours ago. He reports associated cough for the past week and edema in legs bilaterally for the past 1.5 days. He denies associated chest pain or tenderness to chest. He states he takes lasix and aspirin daily. He has not taken his aspirin for today yet. He states that he occasionally uses nitroglycerin but has not used it for his symptoms yet. He states that his symptoms worsen with ambulation or laying in supine position. Past Medical History  Diagnosis Date  . Arteriosclerotic cardiovascular disease (ASCVD)     a. 05/2011 Cath/PCI: LM nl, LAD 65m, D1 small, D2 small 53m, LCX large 40p, RCA 50-60p, 99 hazy @ origin of PDA with 70-80 in PDA (2.5x26 Resolute Integrity & 3.0x15 Resolute Integrity DES).;  b. 08/2012 Inflat  STEMI/Cath/PCI: LM minor irregs, LAD 50p, D1 50, LCX nl, OM1 25, RCA 30-40p, 100d (treated with 2.75x24mm Promus Premier DES);  c. 08/2012 Echo: EF 55-60%, basal inferopost HK.  [name] Hyperlipidemia   . Diabetes mellitus     Peripheral neuropathy  . Bell palsy   . Hypertension   . COPD (chronic obstructive pulmonary disease)   . Sleep apnea   . Gallstones   . Cholelithiasis 07/2012    Asymptomatic; identified incidentally  . PONV  (postoperative nausea and vomiting)   . Anxiety   . C. difficile colitis     a. 08/2012  . Myocardial infarct 09/08/12  . Nephrolithiasis   . Contrast dye induced nephropathy     a. 08/2012 post cath/pci  . CHF (congestive heart failure)   . Asthma   . Heart disease   . Old myocardial infarction   . Anginal pain   . GERD (gastroesophageal reflux disease)    Past Surgical History  Procedure Laterality Date  . Circumcision    . Stents    . Esophagogastroduodenoscopy      in [place] over 20 yrs ago  . Colonoscopy      In [facility], approximately 2011 per patient, was normal. Advised to come back in 10 years.  . Esophagogastroduodenoscopy  06/12/2012    [id number] esophagus-status post Venia Minks dilation. Abnormal gastric mucosa of uncertain significance-status post biopsy  . Cardiac catheterization    . Subxyphoid pericardial window N/A 06/23/2013    Procedure: SUBXYPHOID PERICARDIAL WINDOW;  Surgeon: Grace Isaac, MD;  Location: [facility];  Service: Thoracic;  Laterality: N/A;  . Intraoperative transesophageal echocardiogram N/A 06/23/2013    Procedure: INTRAOPERATIVE TRANSESOPHAGEAL ECHOCARDIOGRAM;  Surgeon: Grace Isaac, MD;  Location: [facility];  Service: Open Heart Surgery;  Laterality: N/A;   Family History  Problem Relation Age of Onset  . Diabetes Mother   . Heart attack Mother   . Stroke Mother   .  Diabetes Sister   . Sleep apnea Sister   . Hypertension Brother   . Diabetes Brother   . Colon cancer Neg Hx   . Liver disease Neg Hx   . Diabetes Brother   . Hypertension Brother    History  Substance Use Topics  . Smoking status: Never Smoker   . Smokeless tobacco: Never Used  . Alcohol Use: No     Comment: heavy etoh use 30 years ago   Review of Systems  Respiratory: Positive for cough, chest tightness and shortness of breath.   Cardiovascular: Positive for leg swelling.  All other systems reviewed and are negative.  Allergies  Hydrocodone;  Lisinopril; Neurontin; Statins; and Metformin and related  Home Medications   Prior to Admission medications   Medication Sig Start Date End Date Taking? Authorizing Provider  ALPRAZolam Duanne Moron) 0.5 MG tablet Take 0.5 mg by mouth at bedtime as needed for sleep.   Yes Historical Provider, MD  amLODipine (NORVASC) 5 MG tablet Take 1 tablet (5 mg total) by mouth daily. 06/25/14  Yes Herminio Commons, MD  aspirin EC 81 MG tablet Take 81 mg by mouth at bedtime.    Yes Historical Provider, MD  budesonide-formoterol (SYMBICORT) 160-4.5 MCG/ACT inhaler Inhale 2 puffs into the lungs 2 (two) times daily.    Yes Historical Provider, MD  clopidogrel (PLAVIX) 75 MG tablet Take 1 tablet (75 mg total) by mouth at bedtime. 02/19/14  Yes [name], MD  fenofibrate 160 MG tablet Take 160 mg by mouth at bedtime.   Yes Historical Provider, MD  hydrALAZINE (APRESOLINE) 25 MG tablet Take 37.5 mg by mouth 2 (two) times daily.   Yes Historical Provider, MD  LANTUS SOLOSTAR 100 UNIT/ML Solostar Pen Inject 80 Units into the skin at bedtime.  05/27/14  Yes Historical Provider, MD  losartan (COZAAR) 100 MG tablet Take 1 tablet (100 mg total) by mouth daily. 02/19/14  Yes [name], MD  metoprolol tartrate (LOPRESSOR) 25 MG tablet Take 1 tablet (25 mg total) by mouth 2 (two) times daily. 01/29/14  Yes [name], MD  rosuvastatin (CRESTOR) 10 MG tablet Take 1 tablet (10 mg total) by mouth daily. 06/25/14  Yes Herminio Commons, MD  torsemide (DEMADEX) 20 MG tablet Take 0.5-1 tablets (10-20 mg total) by mouth 2 (two) times daily. Take 1 tab po qam and 0.5 tab po qpm 01/29/14  Yes Scott A Luking, MD  insulin detemir (LEVEMIR) 100 UNIT/ML injection Inject 80 Units into the skin at bedtime.    Historical Provider, MD  methylcellulose (ARTIFICIAL TEARS) 1 % ophthalmic solution Place 2 drops into the left eye 2 (two) times daily as needed (dry eye).    Historical Provider, MD  nitroGLYCERIN (NITROSTAT) 0.4 MG SL tablet  Place 0.4 mg under the tongue every 5 (five) minutes as needed for chest pain.    Historical Provider, MD   Triage Vitals: BP 173/86 mmHg  Pulse 79  Temp(Src) 98.1 F (36.7 C) (Oral)  Resp 16  Ht 6\' 1"  (1.854 m)  Wt 272 lb (123.378 kg)  BMI 35.89 kg/m2  SpO2 99%  Physical Exam  Constitutional: He is oriented to person, place, and time. He appears well-developed and well-nourished. No distress.  HENT:  Head: Normocephalic and atraumatic.  Right Ear: Hearing normal.  Left Ear: Hearing normal.  Nose: Nose normal.  Mouth/Throat: Oropharynx is clear and moist and mucous membranes are normal.  Eyes: Conjunctivae and EOM are normal. Pupils are equal,  round, and reactive to light.  Neck: Normal range of motion. Neck supple.  Cardiovascular: Regular rhythm, S1 normal and S2 normal.  Exam reveals no gallop and no friction rub.   No murmur heard. 3+ edema in legs bilaterally.  Pulmonary/Chest: Effort normal and breath sounds normal. No respiratory distress. He exhibits no tenderness.  Abdominal: Soft. Normal appearance and bowel sounds are normal. There is no hepatosplenomegaly. There is no tenderness. There is no rebound, no guarding, no tenderness at McBurney's point and negative Murphy's sign. No hernia.  Musculoskeletal: Normal range of motion.  Neurological: He is alert and oriented to person, place, and time. He has normal strength. No cranial nerve deficit or sensory deficit. Coordination normal. GCS eye subscore is 4. GCS verbal subscore is 5. GCS motor subscore is 6.  Skin: Skin is warm, dry and intact. No rash noted. No cyanosis.  Psychiatric: He has a normal mood and affect. His speech is normal and behavior is normal. Thought content normal.  Nursing note and vitals reviewed.  ED Course  Procedures (including critical care time)  DIAGNOSTIC STUDIES: Oxygen Saturation is 99% on RA, normal by my interpretation.    COORDINATION OF CARE: 4:46 PM- Discussed plans to order  diagnostic CXR, EKG and blood work. Will monitor patient and re-evaluate. Pt advised of plan for treatment and pt agrees.  Labs Review Labs Reviewed  CBC WITH DIFFERENTIAL - Abnormal; Notable for the following:    RBC 3.65 (*)    Hemoglobin 11.1 (*)    HCT 32.3 (*)    All other components within normal limits  COMPREHENSIVE METABOLIC PANEL - Abnormal; Notable for the following:    Sodium 136 (*)    Glucose, Bld 195 (*)    BUN 37 (*)    Creatinine, Ser 1.72 (*)    GFR calc non Af Amer 42 (*)    GFR calc Af Amer 49 (*)    All other components within normal limits  PRO B NATRIURETIC PEPTIDE - Abnormal; Notable for the following:    Pro B Natriuretic peptide (BNP) 351.9 (*)    All other components within normal limits  TROPONIN I  LIPASE, BLOOD   Imaging Review Dg Chest 2 View  07/03/2014   CLINICAL DATA:  SOB, swollen feet and ankles, chills, cough, hx of CHF, hx of prior heart attack  EXAM: CHEST  2 VIEW  COMPARISON:  05/08/2014.  FINDINGS: The heart size and mediastinal contours are within normal limits. Both lungs are clear. The bony thorax is intact.  IMPRESSION: No active cardiopulmonary disease.   Electronically Signed   By: Lajean Manes M.D.   On: 07/03/2014 17:26     EKG Interpretation   Date/Time:  Saturday July 03 2014 16:41:16 EST Ventricular Rate:  85 PR Interval:  184 QRS Duration: 111 QT Interval:  372 QTC Calculation: 442 R Axis:   100 Text Interpretation:  Sinus rhythm Right axis deviation Low voltage,  precordial leads No significant change since last tracing Confirmed by  POLLINA  MD, [place] [phone]) on 07/03/2014 4:46:46 PM     MDM   Final diagnoses:  Chest pain, unspecified chest pain type  Acute on chronic diastolic congestive heart failure   Patient presented to the ER for evaluation of difficulty breathing. Patient does report that he has had some cough for the last couple of weeks, but has been nonproductive. Over the last 1 or 2 days he  has noticed significant increase in his lower extremity swelling and  today has become very short of breath. He has baseline shortness of breath with worsening with exertion. Patient does have a previous history of MI and congestive heart failure. Reports an 8 or 9 pound weight gain since last week.  Examination revealed significant pitting edema of the lower extremities.lung examination did not reveal any bronchospasm. No clinical concern for pneumonia. Chest x-ray does not show any evidence of infiltrate or significant volume overload. Patient's EKG does not show any evidence of ischemia. BNP is only slightly elevated. I cannot determine if this is secondary to congestive heart failure or possibly anginal. He does not appear to have any evidence of COPD exacerbation at this time.  Reviewing the patient's records reveals that the patienthas a history of pericardial effusion, status post pericardial window. He is not exhibiting any tamponade physiology.  Patient the patient's previous cardiac history and his current symptoms, I will ask the hospitalist to observe him overnight for serial cardiac enzymes. Patient was administered IV Lasix here in the ER to initiate diuresis.  I personally performed the services described in this documentation, which was scribed in my presence. The recorded information has been reviewed and is accurate.  Eugene Greek, MD 07/03/14 Drew, MD 07/03/14 Bosie Helper

## 2014-07-03 NOTE — ED Notes (Signed)
Pt states SOB and upper abdominal pain began 1 1/2 hour PTA. Pt states hx of CHF and states he feels like he has fluid. Took a fluid pill earlier with not relief

## 2014-07-04 DIAGNOSIS — Z8249 Family history of ischemic heart disease and other diseases of the circulatory system: Secondary | ICD-10-CM | POA: Diagnosis not present

## 2014-07-04 DIAGNOSIS — R0602 Shortness of breath: Secondary | ICD-10-CM | POA: Diagnosis present

## 2014-07-04 DIAGNOSIS — Z79899 Other long term (current) drug therapy: Secondary | ICD-10-CM | POA: Diagnosis not present

## 2014-07-04 DIAGNOSIS — Z823 Family history of stroke: Secondary | ICD-10-CM | POA: Diagnosis not present

## 2014-07-04 DIAGNOSIS — N183 Chronic kidney disease, stage 3 (moderate): Secondary | ICD-10-CM | POA: Diagnosis present

## 2014-07-04 DIAGNOSIS — R0789 Other chest pain: Secondary | ICD-10-CM

## 2014-07-04 DIAGNOSIS — I252 Old myocardial infarction: Secondary | ICD-10-CM | POA: Diagnosis not present

## 2014-07-04 DIAGNOSIS — I129 Hypertensive chronic kidney disease with stage 1 through stage 4 chronic kidney disease, or unspecified chronic kidney disease: Secondary | ICD-10-CM | POA: Diagnosis present

## 2014-07-04 DIAGNOSIS — G4733 Obstructive sleep apnea (adult) (pediatric): Secondary | ICD-10-CM | POA: Diagnosis present

## 2014-07-04 DIAGNOSIS — Z7902 Long term (current) use of antithrombotics/antiplatelets: Secondary | ICD-10-CM | POA: Diagnosis not present

## 2014-07-04 DIAGNOSIS — N179 Acute kidney failure, unspecified: Secondary | ICD-10-CM | POA: Diagnosis present

## 2014-07-04 DIAGNOSIS — Z7982 Long term (current) use of aspirin: Secondary | ICD-10-CM | POA: Diagnosis not present

## 2014-07-04 DIAGNOSIS — I5033 Acute on chronic diastolic (congestive) heart failure: Secondary | ICD-10-CM | POA: Diagnosis present

## 2014-07-04 DIAGNOSIS — E1140 Type 2 diabetes mellitus with diabetic neuropathy, unspecified: Secondary | ICD-10-CM | POA: Diagnosis present

## 2014-07-04 DIAGNOSIS — Z794 Long term (current) use of insulin: Secondary | ICD-10-CM | POA: Diagnosis not present

## 2014-07-04 DIAGNOSIS — K219 Gastro-esophageal reflux disease without esophagitis: Secondary | ICD-10-CM | POA: Diagnosis present

## 2014-07-04 DIAGNOSIS — Z9861 Coronary angioplasty status: Secondary | ICD-10-CM | POA: Diagnosis not present

## 2014-07-04 DIAGNOSIS — E785 Hyperlipidemia, unspecified: Secondary | ICD-10-CM | POA: Diagnosis present

## 2014-07-04 DIAGNOSIS — Z833 Family history of diabetes mellitus: Secondary | ICD-10-CM | POA: Diagnosis not present

## 2014-07-04 DIAGNOSIS — J449 Chronic obstructive pulmonary disease, unspecified: Secondary | ICD-10-CM | POA: Diagnosis present

## 2014-07-04 DIAGNOSIS — I2510 Atherosclerotic heart disease of native coronary artery without angina pectoris: Secondary | ICD-10-CM | POA: Diagnosis present

## 2014-07-04 DIAGNOSIS — J45909 Unspecified asthma, uncomplicated: Secondary | ICD-10-CM | POA: Diagnosis present

## 2014-07-04 LAB — COMPREHENSIVE METABOLIC PANEL
ALBUMIN: 3.4 g/dL — AB (ref 3.5–5.2)
ALK PHOS: 52 U/L (ref 39–117)
ALT: 23 U/L (ref 0–53)
AST: 26 U/L (ref 0–37)
Anion gap: 14 (ref 5–15)
BUN: 39 mg/dL — ABNORMAL HIGH (ref 6–23)
CO2: 27 mEq/L (ref 19–32)
Calcium: 9.4 mg/dL (ref 8.4–10.5)
Chloride: 97 mEq/L (ref 96–112)
Creatinine, Ser: 2.13 mg/dL — ABNORMAL HIGH (ref 0.50–1.35)
GFR calc Af Amer: 38 mL/min — ABNORMAL LOW (ref >90)
GFR calc non Af Amer: 33 mL/min — ABNORMAL LOW (ref >90)
GLUCOSE: 209 mg/dL — AB (ref 70–99)
POTASSIUM: 4.4 meq/L (ref 3.7–5.3)
SODIUM: 138 meq/L (ref 137–147)
TOTAL PROTEIN: 6.9 g/dL (ref 6.0–8.3)
Total Bilirubin: 0.3 mg/dL (ref 0.3–1.2)

## 2014-07-04 LAB — CBC
HCT: 31.6 % — ABNORMAL LOW (ref 39.0–52.0)
HEMOGLOBIN: 10.7 g/dL — AB (ref 13.0–17.0)
MCH: 30.4 pg (ref 26.0–34.0)
MCHC: 33.9 g/dL (ref 30.0–36.0)
MCV: 89.8 fL (ref 78.0–100.0)
Platelets: 226 10*3/uL (ref 150–400)
RBC: 3.52 MIL/uL — ABNORMAL LOW (ref 4.22–5.81)
RDW: 13 % (ref 11.5–15.5)
WBC: 6.1 10*3/uL (ref 4.0–10.5)

## 2014-07-04 LAB — GLUCOSE, CAPILLARY
GLUCOSE-CAPILLARY: 170 mg/dL — AB (ref 70–99)
GLUCOSE-CAPILLARY: 269 mg/dL — AB (ref 70–99)
Glucose-Capillary: 299 mg/dL — ABNORMAL HIGH (ref 70–99)
Glucose-Capillary: 289 mg/dL — ABNORMAL HIGH (ref 70–99)

## 2014-07-04 LAB — TROPONIN I: Troponin I: <0.3 ng/mL (ref <0.30)

## 2014-07-04 MED ORDER — INSULIN GLARGINE 100 UNIT/ML ~~LOC~~ SOLN
43.0000 [IU] | Freq: Every day | SUBCUTANEOUS | Status: DC
  Administered 2014-07-04: 43 [IU] via SUBCUTANEOUS
  Filled 2014-07-04 (×2): qty 0.43

## 2014-07-04 MED ORDER — FUROSEMIDE 10 MG/ML IJ SOLN
40.0000 mg | Freq: Two times a day (BID) | INTRAMUSCULAR | Status: DC
  Administered 2014-07-04: 40 mg via INTRAVENOUS
  Filled 2014-07-04: qty 4

## 2014-07-04 MED ORDER — TORSEMIDE 20 MG PO TABS
20.0000 mg | ORAL_TABLET | Freq: Every day | ORAL | Status: DC
  Administered 2014-07-05: 20 mg via ORAL
  Filled 2014-07-04: qty 1

## 2014-07-04 MED ORDER — TORSEMIDE 20 MG PO TABS
10.0000 mg | ORAL_TABLET | Freq: Every evening | ORAL | Status: DC
  Administered 2014-07-04: 10 mg via ORAL
  Filled 2014-07-04: qty 1

## 2014-07-04 NOTE — Progress Notes (Signed)
[facility] PROGRESS NOTE  Eugene [name] [id number] DOB: 07-17-1956 DOA: 07/03/2014 PCP: [name], MD  Assessment/Plan: Chest pain -Resolved. -Ruled out for acute coronary syndrome with negative troponins and an EKG without acute ischemic abnormalities. -Do not anticipate any further inpatient workup and he can follow-up with his cardiologist as an outpatient.  Acute on chronic diastolic CHF -Echocardiogram from 02/2014 with an ejection fraction of 65-70%.  -Was given 80 mg of Lasix in the emergency department plus an extra 40 this morning. -he is 1500 mL negative since admission, his lungs are clear. -We'll stop Lasix and revert back to his home dose of torsemide at this point.  Acute on chronic kidney disease stage III -Baseline creatinine about 1.6. -Creatinine was 1.7 on admission and has risen to2.13 today. -Believe acute component related to diuresis. -We'll hold his ARB for now.  Hypertension -Fair control. -Continue home medications with the exception of losartan which has been discontinued given his acute renal failure.  Hyperlipidemia -Continue home dose of Crestor.  Diabetes -CBGs remained elevated. -Increase Lantus to 43 units.  Code Status: full code Family Communication: patient only  Disposition Plan: home when ready, likely 24-48 hours   Consultants:  none   Antibiotics:  none   Subjective: No complaints, chest pain resolved  Objective: Filed Vitals:   07/03/14 2209 07/04/14 0500 07/04/14 0706 07/04/14 1026  BP:  115/58  155/74  Pulse:  60  77  Temp:      TempSrc:      Resp:  20    Height:      Weight:      SpO2: 96% 100% 99%     Intake/Output Summary (Last 24 hours) at 07/04/14 1429 Last data filed at 07/04/14 [id number]  Gross per 24 hour  Intake      0 ml  Output   1450 ml  Net  -1450 ml   Filed Weights   07/03/14 1634 07/03/14 2030  Weight: 123.378 kg (272 lb) 123.605 kg (272 lb 8 oz)     Exam:   General:  Alert, awake, oriented 3, no distress  Cardiovascular: regular rate and rhythm, no murmurs, rubs or gallops.  Respiratory: clear to auscultation bilaterally  Abdomen: obese, soft, nontender, positive bowel sounds  Extremities: 2+ pitting edema bilaterally   Neurologic:  nonfocal  Data Reviewed: Basic Metabolic Panel:  Recent Labs Lab 07/03/14 1636 07/04/14 0101  NA 136* 138  K 4.8 4.4  CL 99 97  CO2 24 27  GLUCOSE 195* 209*  BUN 37* 39*  CREATININE 1.72* 2.13*  CALCIUM 9.6 9.4   Liver Function Tests:  Recent Labs Lab 07/03/14 1636 07/04/14 0101  AST 31 26  ALT 26 23  ALKPHOS 61 52  BILITOT 0.3 0.3  PROT 7.1 6.9  ALBUMIN 3.6 3.4*    Recent Labs Lab 07/03/14 1636  LIPASE 16   No results for input(s): AMMONIA in the last 168 hours. CBC:  Recent Labs Lab 07/03/14 1636 07/04/14 0101  WBC 6.5 6.1  NEUTROABS 4.2  --   HGB 11.1* 10.7*  HCT 32.3* 31.6*  MCV 88.5 89.8  PLT 213 226   Cardiac Enzymes:  Recent Labs Lab 07/03/14 1636 07/03/14 2142 07/04/14 0101  TROPONINI <0.30 <0.30 <0.30   BNP (last 3 results)  Recent Labs  10/15/13 2015 01/25/14 2143 07/03/14 1636  PROBNP 221.3* 678.1* 351.9*   CBG:  Recent Labs Lab 07/04/14 0738 07/04/14 1121  GLUCAP 170* 299*    No results  found for this or any previous visit (from the past 240 hour(s)).   Studies: Dg Chest 2 View  07/03/2014   CLINICAL DATA:  SOB, swollen feet and ankles, chills, cough, hx of CHF, hx of prior heart attack  EXAM: CHEST  2 VIEW  COMPARISON:  05/08/2014.  FINDINGS: The heart size and mediastinal contours are within normal limits. Both lungs are clear. The bony thorax is intact.  IMPRESSION: No active cardiopulmonary disease.   Electronically Signed   By: Lajean Manes M.D.   On: 07/03/2014 17:26    Scheduled Meds: . amLODipine  5 mg Oral Daily  . aspirin EC  81 mg Oral QHS  . budesonide-formoterol  2 puff Inhalation BID  . clopidogrel   75 mg Oral QHS  . heparin  5,000 Units Subcutaneous 3 times per day  . hydrALAZINE  37.5 mg Oral BID  . insulin aspart  0-15 Units Subcutaneous TID WC  . insulin glargine  40 Units Subcutaneous QHS  . metoprolol tartrate  25 mg Oral BID  . rosuvastatin  10 mg Oral Daily  . sodium chloride  3 mL Intravenous Q12H  . torsemide  10 mg Oral QPM  . [START ON 07/05/2014] torsemide  20 mg Oral Q breakfast   Continuous Infusions:   Principal Problem:   Chest pain Active Problems:   Hypertension   Hyperlipidemia   Obstructive sleep apnea- on C-pap   Edema   Type 2 diabetes mellitus with neurological manifestations   SOB (shortness of breath)   Chronic renal insufficiency, stage III (moderate)   Acute on chronic diastolic heart failure    Time spent: 35 minutes. Greater than 50% of this time was spent in direct contact with the patient coordinating care.    Lelon Frohlich  [facility] Pager [phone]  If 7PM-7AM, please contact night-coverage at [email or web address], password [facility] 07/04/2014, 2:29 PM  LOS: 1 day

## 2014-07-04 NOTE — Progress Notes (Signed)
Patient stated that he could get CPAP on by himself tonight. Told patient to call RT if he needed any help.

## 2014-07-04 NOTE — Plan of Care (Signed)
Problem: Phase I Progression Outcomes Goal: Up in chair, BRP Outcome: Completed/Met Date Met:  07/04/14

## 2014-07-04 NOTE — Plan of Care (Signed)
Problem: Consults Goal: Heart Failure Patient Education (See Patient Education module for education specifics.) Outcome: Progressing     

## 2014-07-04 NOTE — Plan of Care (Signed)
Problem: Phase I Progression Outcomes Goal: Dyspnea controlled at rest (HF) Outcome: Completed/Met Date Met:  07/04/14     

## 2014-07-04 NOTE — Progress Notes (Signed)
UR completed 

## 2014-07-05 ENCOUNTER — Ambulatory Visit: Payer: Commercial Managed Care - HMO | Admitting: Gastroenterology

## 2014-07-05 DIAGNOSIS — R072 Precordial pain: Secondary | ICD-10-CM

## 2014-07-05 LAB — CBC
HCT: 30.7 % — ABNORMAL LOW (ref 39.0–52.0)
Hemoglobin: 10.4 g/dL — ABNORMAL LOW (ref 13.0–17.0)
MCH: 30.4 pg (ref 26.0–34.0)
MCHC: 33.9 g/dL (ref 30.0–36.0)
MCV: 89.8 fL (ref 78.0–100.0)
Platelets: 191 10*3/uL (ref 150–400)
RBC: 3.42 MIL/uL — ABNORMAL LOW (ref 4.22–5.81)
RDW: 12.9 % (ref 11.5–15.5)
WBC: 5.1 10*3/uL (ref 4.0–10.5)

## 2014-07-05 LAB — BASIC METABOLIC PANEL
Anion gap: 11 (ref 5–15)
BUN: 41 mg/dL — ABNORMAL HIGH (ref 6–23)
CO2: 27 mEq/L (ref 19–32)
Calcium: 9.3 mg/dL (ref 8.4–10.5)
Chloride: 99 mEq/L (ref 96–112)
Creatinine, Ser: 1.89 mg/dL — ABNORMAL HIGH (ref 0.50–1.35)
GFR, EST AFRICAN AMERICAN: 44 mL/min — AB (ref >90)
GFR, EST NON AFRICAN AMERICAN: 38 mL/min — AB (ref >90)
Glucose, Bld: 257 mg/dL — ABNORMAL HIGH (ref 70–99)
Potassium: 4.7 mEq/L (ref 3.7–5.3)
Sodium: 137 mEq/L (ref 137–147)

## 2014-07-05 LAB — GLUCOSE, CAPILLARY: GLUCOSE-CAPILLARY: 280 mg/dL — AB (ref 70–99)

## 2014-07-05 MED ORDER — INSULIN ASPART 100 UNIT/ML ~~LOC~~ SOLN: 0.0000 [IU] | Freq: Every day | SUBCUTANEOUS | Status: DC

## 2014-07-05 MED ORDER — INSULIN ASPART 100 UNIT/ML ~~LOC~~ SOLN
0.0000 [IU] | Freq: Three times a day (TID) | SUBCUTANEOUS | Status: DC
Start: 2014-07-05 — End: 2014-07-05
  Administered 2014-07-05: 5 [IU] via SUBCUTANEOUS

## 2014-07-05 NOTE — Progress Notes (Signed)
CBG increased - Dr informed and ordered ACHS checks and SSI sensitive

## 2014-07-05 NOTE — Progress Notes (Signed)
Inpatient Diabetes Program Recommendations  AACE/ADA: New Consensus Statement on Inpatient Glycemic Control (2013)  Target Ranges:  Prepandial:   less than 140 mg/dL      Peak postprandial:   less than 180 mg/dL (1-2 hours)      Critically ill patients:  140 - 180 mg/dL  Results for QUENTARIUS, KADRI (MRN [id number]) as of 07/05/2014 08:30  Ref. Range 07/05/2014 05:56  Glucose Latest Range: 70-99 mg/dL 257 (H)   Results for HILMAR, BUCKLER (MRN [id number]) as of 07/05/2014 08:30  Ref. Range 07/04/2014 07:38 07/04/2014 11:21 07/04/2014 17:14 07/04/2014 22:02  Glucose-Capillary Latest Range: 70-99 mg/dL 170 (H) 299 (H) 269 (H) 289 (H)   Diabetes history: DM2 Outpatient Diabetes medications: Lantus 80 units QHS Current orders for Inpatient glycemic control: Lantus 43 units QHS, Novolog 0-9 units AC, Novolog 0-5 units HS  Inpatient Diabetes Program Recommendations Insulin - Basal: Please consider increasing Lantus to 50 units QHS. Correction (SSI): Please consider increasing Novolog correction to moderate scale. Insulin - Meal Coverage: Please consider ordering Novolog 5 units TID with meals for meal coverage.  Thanks, Barnie Alderman, RN, MSN, CCRN, CDE Diabetes Coordinator Inpatient Diabetes Program [phone] (Team Pager) [phone] ([facility] office) [phone] [facility] office)

## 2014-07-05 NOTE — Discharge Summary (Signed)
Physician Discharge Summary  Eugene [name] Eugene [id number] DOB: 11/01/1955 DOA: 07/03/2014  PCP: [name], MD  Admit date: 07/03/2014 Discharge date: 07/05/2014  Time spent: 45 minutes  Recommendations for Outpatient Follow-up:  -Will be discharged home today. -Advised to follow up with PCP in 3 days for repeat BMET.   Discharge Diagnoses:  Principal Problem:   Chest pain Active Problems:   Hypertension   Hyperlipidemia   Obstructive sleep apnea- on C-pap   Edema   Type 2 diabetes mellitus with neurological manifestations   SOB (shortness of breath)   Chronic renal insufficiency, stage III (moderate)   Acute on chronic diastolic heart failure   Discharge Condition: Stable and improved  Filed Weights   07/03/14 1634 07/03/14 2030 07/05/14 0547  Weight: 123.378 kg (272 lb) 123.605 kg (272 lb 8 oz) 122.018 kg (269 lb)    History of present illness:  Eugene [name] is a 58 y.o. male  Increasing SOB over the past few days. Associated w/ CP. Comes and goes. No change in nightly CPAP and able to sleep lying down. Tried increasing torsemide 20mg  BID (typicaly 20mg  QAM and 10mg  QHS). CP at rest or exertion. SOB and CP rlieved by nitro. H/o MI w/o any CP but SOB. BM daily.   Hospital Course:   Chest pain -Resolved. -Ruled out for acute coronary syndrome with negative troponins and an EKG without acute ischemic abnormalities. -Do not anticipate any further inpatient workup and he can follow-up with his cardiologist as an outpatient if pain recurs.  Acute on chronic diastolic CHF -Echocardiogram from 02/2014 with an ejection fraction of 65-70%.  -Was given 80 mg of Lasix in the emergency department plus an extra 40 mg on 11/8. -he is 2200 mL negative since admission, his lungs are clear. -We'll stop Lasix and revert back to his home dose of torsemide at this point.  Acute on chronic kidney disease stage III -Baseline creatinine about 1.6. -Creatinine was 1.7  on admission and has risen to2.13 today. -Believe acute component related to diuresis. -We'll hold his ARB for now. -Would recommend repeat renal function in 3 days to ensure Cr is trending back down towards baseline.  Hypertension -Fair control. -Continue home medications with the exception of losartan which has been discontinued given his acute renal failure.  Hyperlipidemia -Continue home dose of Crestor.  Diabetes -CBGs remained elevated. -Can continue to titrate lantus in the OP setting for optimal diabetes management.  Procedures:  None   Consultations:  None  Discharge Instructions  Discharge Instructions    Diet - low sodium heart healthy    Complete by:  As directed      Increase activity slowly    Complete by:  As directed             Medication List    STOP taking these medications        insulin detemir 100 UNIT/ML injection  Commonly known as:  LEVEMIR     losartan 100 MG tablet  Commonly known as:  COZAAR     methylcellulose 1 % ophthalmic solution  Commonly known as:  ARTIFICIAL TEARS      TAKE these medications        ALPRAZolam 0.5 MG tablet  Commonly known as:  XANAX  Take 0.5 mg by mouth at bedtime as needed for sleep.     amLODipine 5 MG tablet  Commonly known as:  NORVASC  Take 1 tablet (5 mg total) by mouth daily.  aspirin EC 81 MG tablet  Take 81 mg by mouth at bedtime.     budesonide-formoterol 160-4.5 MCG/ACT inhaler  Commonly known as:  SYMBICORT  Inhale 2 puffs into the lungs 2 (two) times daily.     clopidogrel 75 MG tablet  Commonly known as:  PLAVIX  Take 1 tablet (75 mg total) by mouth at bedtime.     fenofibrate 160 MG tablet  Take 160 mg by mouth at bedtime.     hydrALAZINE 25 MG tablet  Commonly known as:  APRESOLINE  Take 37.5 mg by mouth 2 (two) times daily.     LANTUS SOLOSTAR 100 UNIT/ML Solostar Pen  Generic drug:  Insulin Glargine  Inject 80 Units into the skin at bedtime.     metoprolol  tartrate 25 MG tablet  Commonly known as:  LOPRESSOR  Take 1 tablet (25 mg total) by mouth 2 (two) times daily.     nitroGLYCERIN 0.4 MG SL tablet  Commonly known as:  NITROSTAT  Place 0.4 mg under the tongue every 5 (five) minutes as needed for chest pain.     rosuvastatin 10 MG tablet  Commonly known as:  CRESTOR  Take 1 tablet (10 mg total) by mouth daily.     torsemide 20 MG tablet  Commonly known as:  DEMADEX  Take 0.5-1 tablets (10-20 mg total) by mouth 2 (two) times daily. Take 1 tab po qam and 0.5 tab po qpm       Allergies  Allergen Reactions  . Hydrocodone Nausea And Vomiting  . Lisinopril Cough  . Neurontin [Gabapentin] Other (See Comments)    Suicidal thoughts  . Statins Other (See Comments)    Muscle aches  . Metformin And Related Diarrhea and Other (See Comments)    Intestinal side effects       Follow-up Information    Follow up with LUKING,SCOTT, MD. Schedule an appointment as soon as possible for a visit in 3 days.   Specialty:  Family Medicine   Contact information:   [address] [place] [address] [phone]        The results of significant diagnostics from this hospitalization (including imaging, microbiology, ancillary and laboratory) are listed below for reference.    Significant Diagnostic Studies: Dg Chest 2 View  07/03/2014   CLINICAL DATA:  SOB, swollen feet and ankles, chills, cough, hx of CHF, hx of prior heart attack  EXAM: CHEST  2 VIEW  COMPARISON:  05/08/2014.  FINDINGS: The heart size and mediastinal contours are within normal limits. Both lungs are clear. The bony thorax is intact.  IMPRESSION: No active cardiopulmonary disease.   Electronically Signed   By: Lajean Manes M.D.   On: 07/03/2014 17:26   Dg Esophagus  07/01/2014   CLINICAL DATA:  Dysphagia, shortness of breath, left upper quadrant pain  EXAM: ESOPHOGRAM / BARIUM SWALLOW / BARIUM TABLET STUDY  TECHNIQUE: Combined double contrast and single contrast  examination performed using effervescent crystals, thick barium liquid, and thin barium liquid. The patient was observed with fluoroscopy swallowing a 108mm barium sulphate tablet.  FLUOROSCOPY TIME:  1 min 24 seconds  COMPARISON:  None.  FINDINGS: Mild esophageal dysmotility.  No fixed esophageal narrowing or stricture. A 13 mm barium tablet passed into the stomach without delay.  Suspected small hiatal hernia.  No gastroesophageal reflux was demonstrated.  IMPRESSION: Mild esophageal dysmotility.  No fixed esophageal narrowing or stricture.   Electronically Signed   By: Henderson Newcomer.D.  On: 07/01/2014 14:42    Microbiology: No results found for this or any previous visit (from the past 240 hour(s)).   Labs: Basic Metabolic Panel:  Recent Labs Lab 07/03/14 1636 07/04/14 0101 07/05/14 0556  NA 136* 138 137  K 4.8 4.4 4.7  CL 99 97 99  CO2 24 27 27   GLUCOSE 195* 209* 257*  BUN 37* 39* 41*  CREATININE 1.72* 2.13* 1.89*  CALCIUM 9.6 9.4 9.3   Liver Function Tests:  Recent Labs Lab 07/03/14 1636 07/04/14 0101  AST 31 26  ALT 26 23  ALKPHOS 61 52  BILITOT 0.3 0.3  PROT 7.1 6.9  ALBUMIN 3.6 3.4*    Recent Labs Lab 07/03/14 1636  LIPASE 16   No results for input(s): AMMONIA in the last 168 hours. CBC:  Recent Labs Lab 07/03/14 1636 07/04/14 0101 07/05/14 0556  WBC 6.5 6.1 5.1  NEUTROABS 4.2  --   --   HGB 11.1* 10.7* 10.4*  HCT 32.3* 31.6* 30.7*  MCV 88.5 89.8 89.8  PLT 213 226 191   Cardiac Enzymes:  Recent Labs Lab 07/03/14 1636 07/03/14 2142 07/04/14 0101  TROPONINI <0.30 <0.30 <0.30   BNP: BNP (last 3 results)  Recent Labs  10/15/13 2015 01/25/14 2143 07/03/14 1636  PROBNP 221.3* 678.1* 351.9*   CBG:  Recent Labs Lab 07/04/14 0738 07/04/14 1121 07/04/14 1714 07/04/14 2202 07/05/14 0842  GLUCAP 170* 299* 269* 289* 280*       Signed:  HERNANDEZ ACOSTA,ESTELA  [facility] Pager: [phone] 07/05/2014, 2:43  PM

## 2014-07-05 NOTE — Care Management Utilization Note (Signed)
UR review complete.  

## 2014-07-05 NOTE — Progress Notes (Signed)
Patient discharged home with instructions given on medications,and follow up visits,patient,and family verbalized understanding.No c/o pain or discomfort noted. Vital signs stable. Accompanied by staff to an awaiting vehicle.

## 2014-07-05 NOTE — Care Management Note (Unsigned)
    Page 1 of 1   07/05/2014     11:47:36 AM CARE MANAGEMENT NOTE 07/05/2014  Patient:  Eugene [name], Eugene [name]   Account Number:  [id number]  Date Initiated:  07/05/2014  Documentation initiated by:  Jolene Provost  Subjective/Objective Assessment:   Pt is from home, lives with wife and was admitted with HF exacerbation. Pt has a Bipap from [facility]. Pt is active with [facility] through his PCP's office.     Action/Plan:   Pt plans to discharge home today with self care. Pt has no CM needs at this time.   Anticipated DC Date:  07/05/2014   Anticipated DC Plan:  [place]  CM consult      Choice offered to / List presented to:        DME agency  [place].        Status of service:  Completed, signed off Medicare Important Message given?   (If response is "NO", the following Medicare IM given date fields will be blank) Date Medicare IM given:   Medicare IM given by:   Date Additional Medicare IM given:   Additional Medicare IM given by:    Discharge Disposition:  HOME/SELF CARE  Per UR Regulation:    If discussed at Long Length of Stay Meetings, dates discussed:    Comments:  07/05/2014 [place], RN, MSN, [facility]

## 2014-07-05 NOTE — Plan of Care (Signed)
Problem: Discharge Progression Outcomes Goal: Barriers To Progression Addressed/Resolved Outcome: Completed/Met Date Met:  07/05/14 Goal: Able to perform self care activities Outcome: Completed/Met Date Met:  07/05/14 Goal: Discharge plan in place and appropriate Outcome: Completed/Met Date Met:  07/05/14 Goal: Pain controlled with appropriate interventions Outcome: Completed/Met Date Met:  07/05/14 Goal: Hemodynamically stable Outcome: Completed/Met Date Met:  82/66/66 Goal: Complications resolved/controlled Outcome: Completed/Met Date Met:  07/05/14 Goal: Tolerating diet Outcome: Completed/Met Date Met:  07/05/14 Goal: Activity appropriate for discharge plan Outcome: Completed/Met Date Met:  07/05/14 Goal: Other Discharge Outcomes/Goals Outcome: Completed/Met Date Met:  07/05/14

## 2014-07-19 ENCOUNTER — Encounter: Payer: Self-pay | Admitting: Family Medicine

## 2014-07-19 ENCOUNTER — Ambulatory Visit (INDEPENDENT_AMBULATORY_CARE_PROVIDER_SITE_OTHER): Payer: Commercial Managed Care - HMO | Admitting: Family Medicine

## 2014-07-19 VITALS — BP 126/84 | Ht 73.0 in | Wt 272.4 lb

## 2014-07-19 DIAGNOSIS — I10 Essential (primary) hypertension: Secondary | ICD-10-CM

## 2014-07-19 DIAGNOSIS — R0602 Shortness of breath: Secondary | ICD-10-CM

## 2014-07-19 NOTE — Progress Notes (Signed)
   Subjective:    Patient ID: Eugene [name], male    DOB: 08-Jun-1956, 58 y.o.   MRN: [id number]  HPI Patient arrives for a follow up on recent hospitalization for chest pain. Hospital notes were reviewed Lab testing and x-rays reviewed Patient states he still gets a little short of breath when he moves around He states his glucoses have been looking good recently  Review of Systems Positive shortness of breath denies chest pressure tightness pain some swelling in the legs    Objective:   Physical Exam   lungs are clear no crackles heart rate controlled abdomen soft extremities trace edema      Assessment & Plan:   comprehensive diabetic checkup in December   cough with some shortness of breath -if his echo is not revealing I would recommend pulmonary function test   reent elevation of creatinine check metabolic 7

## 2014-07-20 ENCOUNTER — Encounter: Payer: Self-pay | Admitting: Family Medicine

## 2014-07-20 ENCOUNTER — Other Ambulatory Visit: Payer: Commercial Managed Care - HMO

## 2014-07-20 LAB — BASIC METABOLIC PANEL
BUN: 39 mg/dL — ABNORMAL HIGH (ref 6–23)
CHLORIDE: 102 meq/L (ref 96–112)
CO2: 25 meq/L (ref 19–32)
CREATININE: 1.56 mg/dL — AB (ref 0.50–1.35)
Calcium: 9.2 mg/dL (ref 8.4–10.5)
Glucose, Bld: 195 mg/dL — ABNORMAL HIGH (ref 70–99)
Potassium: 5 mEq/L (ref 3.5–5.3)
Sodium: 135 mEq/L (ref 135–145)

## 2014-07-21 ENCOUNTER — Other Ambulatory Visit (INDEPENDENT_AMBULATORY_CARE_PROVIDER_SITE_OTHER): Payer: Commercial Managed Care - HMO

## 2014-07-21 ENCOUNTER — Other Ambulatory Visit: Payer: Self-pay

## 2014-07-21 DIAGNOSIS — R0609 Other forms of dyspnea: Secondary | ICD-10-CM

## 2014-07-21 DIAGNOSIS — I313 Pericardial effusion (noninflammatory): Secondary | ICD-10-CM

## 2014-07-21 DIAGNOSIS — I319 Disease of pericardium, unspecified: Secondary | ICD-10-CM

## 2014-07-21 DIAGNOSIS — I3139 Other pericardial effusion (noninflammatory): Secondary | ICD-10-CM

## 2014-07-28 ENCOUNTER — Other Ambulatory Visit: Payer: Self-pay | Admitting: Family Medicine

## 2014-07-28 ENCOUNTER — Telehealth: Payer: Self-pay | Admitting: *Deleted

## 2014-07-28 NOTE — Telephone Encounter (Signed)
-----   Message from Herminio Commons, MD sent at 07/27/2014  1:23 PM EST ----- Normal pumping function with small to moderate pericardial effusion. Continue to monitor.

## 2014-07-28 NOTE — Telephone Encounter (Signed)
Patient informed. 

## 2014-08-05 ENCOUNTER — Encounter (HOSPITAL_COMMUNITY): Payer: Self-pay | Admitting: Cardiovascular Disease

## 2014-08-06 ENCOUNTER — Ambulatory Visit: Payer: Commercial Managed Care - HMO | Admitting: Gastroenterology

## 2014-08-12 ENCOUNTER — Ambulatory Visit (INDEPENDENT_AMBULATORY_CARE_PROVIDER_SITE_OTHER): Payer: Commercial Managed Care - HMO | Admitting: Family Medicine

## 2014-08-12 ENCOUNTER — Ambulatory Visit: Payer: Medicare PPO | Admitting: Family Medicine

## 2014-08-12 ENCOUNTER — Encounter: Payer: Self-pay | Admitting: Family Medicine

## 2014-08-12 VITALS — BP 158/82 | Ht 73.0 in | Wt 276.0 lb

## 2014-08-12 DIAGNOSIS — I10 Essential (primary) hypertension: Secondary | ICD-10-CM

## 2014-08-12 DIAGNOSIS — E118 Type 2 diabetes mellitus with unspecified complications: Secondary | ICD-10-CM

## 2014-08-12 DIAGNOSIS — R600 Localized edema: Secondary | ICD-10-CM

## 2014-08-12 LAB — POCT GLYCOSYLATED HEMOGLOBIN (HGB A1C): Hemoglobin A1C: 9.9

## 2014-08-12 MED ORDER — INSULIN LISPRO 100 UNIT/ML (KWIKPEN): PEN_INJECTOR | SUBCUTANEOUS | Status: DC

## 2014-08-12 MED ORDER — HYDRALAZINE HCL 25 MG PO TABS: 37.5000 mg | ORAL_TABLET | Freq: Three times a day (TID) | ORAL | Status: DC

## 2014-08-12 NOTE — Progress Notes (Signed)
   Subjective:    Patient ID: Eugene [name], male    DOB: 11/19/1955, 58 y.o.   MRN: [id number]  Diabetes He presents for his follow-up diabetic visit. He has type 2 diabetes mellitus. Hypoglycemia symptoms include dizziness. Associated symptoms include fatigue and weakness. Current diabetic treatment includes insulin injections and oral agent (monotherapy). He is compliant with treatment all of the time. He monitors blood glucose at home 1-2 x per day. His highest blood glucose is >200 mg/dl. He sees a podiatrist.Eye exam is current.      Review of Systems  Constitutional: Positive for fatigue.  Neurological: Positive for dizziness and weakness.   patient denies chest pain shortness of breath currently    Objective:   Physical Exam lungs are clear hearts regular extremities has some edema      Assessment & Plan:  PFT patient is going to try to walk more if he continues has shortness of breath check PFT he is already followed by cardiology  Endocrinology-the patient is going to work hard on his sugar readings and insulin use he will send [place] some readings if we are not seeing his A1c down to an acceptable level within 3 months he must go to endocrinology.  Pedal edema is due to Norvasc. Increase hydralazine hopefully this will help his blood pressure and not cause more swelling in his legs.

## 2014-08-17 LAB — HM DIABETES EYE EXAM

## 2014-08-23 ENCOUNTER — Telehealth: Payer: Self-pay | Admitting: Family Medicine

## 2014-08-23 DIAGNOSIS — Z79899 Other long term (current) drug therapy: Secondary | ICD-10-CM

## 2014-08-23 NOTE — Telephone Encounter (Signed)
He may go ahead with 2 tablets every morning. Will need new prescription for 60 tablets per month with 6 refills, check metabolic 7 in 2 weeks time

## 2014-08-23 NOTE — Telephone Encounter (Signed)
Notified patient he may go ahead with 2 tablets every morning. Check Met 7 in 2 weeks time. Patient verbalized understanding and has plenty of medications right now and doesn't need any sent in.

## 2014-08-23 NOTE — Telephone Encounter (Signed)
Pt wants to know if it is ok to go up on his fluid pills? He takes 1/1/2 now daily and wants to go up  To two so he can have some of the fluid he is retaining in his feet possible go down.   Please advise

## 2014-08-28 ENCOUNTER — Encounter (HOSPITAL_COMMUNITY): Payer: Self-pay | Admitting: Emergency Medicine

## 2014-08-28 ENCOUNTER — Emergency Department (HOSPITAL_COMMUNITY)
Admission: EM | Admit: 2014-08-28 | Discharge: 2014-08-28 | Disposition: A | Discharge status: Home / Self Care | Payer: Medicare HMO | Attending: Emergency Medicine | Admitting: Emergency Medicine

## 2014-08-28 ENCOUNTER — Emergency Department (HOSPITAL_COMMUNITY): Payer: Medicare HMO

## 2014-08-28 DIAGNOSIS — Z7902 Long term (current) use of antithrombotics/antiplatelets: Secondary | ICD-10-CM | POA: Diagnosis not present

## 2014-08-28 DIAGNOSIS — J441 Chronic obstructive pulmonary disease with (acute) exacerbation: Secondary | ICD-10-CM | POA: Insufficient documentation

## 2014-08-28 DIAGNOSIS — I252 Old myocardial infarction: Secondary | ICD-10-CM | POA: Insufficient documentation

## 2014-08-28 DIAGNOSIS — R609 Edema, unspecified: Secondary | ICD-10-CM

## 2014-08-28 DIAGNOSIS — R0600 Dyspnea, unspecified: Secondary | ICD-10-CM

## 2014-08-28 DIAGNOSIS — I10 Essential (primary) hypertension: Secondary | ICD-10-CM | POA: Diagnosis not present

## 2014-08-28 DIAGNOSIS — Z87448 Personal history of other diseases of urinary system: Secondary | ICD-10-CM | POA: Diagnosis not present

## 2014-08-28 DIAGNOSIS — I509 Heart failure, unspecified: Secondary | ICD-10-CM | POA: Insufficient documentation

## 2014-08-28 DIAGNOSIS — F419 Anxiety disorder, unspecified: Secondary | ICD-10-CM | POA: Diagnosis not present

## 2014-08-28 DIAGNOSIS — Z79899 Other long term (current) drug therapy: Secondary | ICD-10-CM | POA: Insufficient documentation

## 2014-08-28 DIAGNOSIS — Y998 Other external cause status: Secondary | ICD-10-CM | POA: Diagnosis not present

## 2014-08-28 DIAGNOSIS — R600 Localized edema: Secondary | ICD-10-CM | POA: Diagnosis not present

## 2014-08-28 DIAGNOSIS — R0609 Other forms of dyspnea: Secondary | ICD-10-CM | POA: Diagnosis not present

## 2014-08-28 DIAGNOSIS — Z8619 Personal history of other infectious and parasitic diseases: Secondary | ICD-10-CM | POA: Diagnosis not present

## 2014-08-28 DIAGNOSIS — R079 Chest pain, unspecified: Secondary | ICD-10-CM | POA: Diagnosis not present

## 2014-08-28 DIAGNOSIS — E785 Hyperlipidemia, unspecified: Secondary | ICD-10-CM | POA: Diagnosis not present

## 2014-08-28 DIAGNOSIS — Z7951 Long term (current) use of inhaled steroids: Secondary | ICD-10-CM | POA: Insufficient documentation

## 2014-08-28 DIAGNOSIS — Y9389 Activity, other specified: Secondary | ICD-10-CM | POA: Diagnosis not present

## 2014-08-28 DIAGNOSIS — Z794 Long term (current) use of insulin: Secondary | ICD-10-CM | POA: Insufficient documentation

## 2014-08-28 DIAGNOSIS — Y9289 Other specified places as the place of occurrence of the external cause: Secondary | ICD-10-CM | POA: Insufficient documentation

## 2014-08-28 DIAGNOSIS — K219 Gastro-esophageal reflux disease without esophagitis: Secondary | ICD-10-CM | POA: Insufficient documentation

## 2014-08-28 DIAGNOSIS — E119 Type 2 diabetes mellitus without complications: Secondary | ICD-10-CM | POA: Insufficient documentation

## 2014-08-28 DIAGNOSIS — X16XXXA Contact with hot heating appliances, radiators and pipes, initial encounter: Secondary | ICD-10-CM | POA: Insufficient documentation

## 2014-08-28 DIAGNOSIS — Z8669 Personal history of other diseases of the nervous system and sense organs: Secondary | ICD-10-CM | POA: Insufficient documentation

## 2014-08-28 DIAGNOSIS — Z7982 Long term (current) use of aspirin: Secondary | ICD-10-CM | POA: Insufficient documentation

## 2014-08-28 DIAGNOSIS — T25221A Burn of second degree of right foot, initial encounter: Secondary | ICD-10-CM | POA: Diagnosis not present

## 2014-08-28 DIAGNOSIS — I25119 Atherosclerotic heart disease of native coronary artery with unspecified angina pectoris: Secondary | ICD-10-CM | POA: Diagnosis not present

## 2014-08-28 LAB — CBC WITH DIFFERENTIAL/PLATELET
BASOS ABS: 0 10*3/uL (ref 0.0–0.1)
Basophils Relative: 1 % (ref 0–1)
EOS PCT: 2 % (ref 0–5)
Eosinophils Absolute: 0.1 10*3/uL (ref 0.0–0.7)
HEMATOCRIT: 31.3 % — AB (ref 39.0–52.0)
HEMOGLOBIN: 10.7 g/dL — AB (ref 13.0–17.0)
LYMPHS ABS: 1.6 10*3/uL (ref 0.7–4.0)
LYMPHS PCT: 25 % (ref 12–46)
MCH: 30.7 pg (ref 26.0–34.0)
MCHC: 34.2 g/dL (ref 30.0–36.0)
MCV: 89.9 fL (ref 78.0–100.0)
MONO ABS: 0.5 10*3/uL (ref 0.1–1.0)
MONOS PCT: 8 % (ref 3–12)
Neutro Abs: 4 10*3/uL (ref 1.7–7.7)
Neutrophils Relative %: 64 % (ref 43–77)
Platelets: 189 10*3/uL (ref 150–400)
RBC: 3.48 MIL/uL — ABNORMAL LOW (ref 4.22–5.81)
RDW: 13.2 % (ref 11.5–15.5)
WBC: 6.1 10*3/uL (ref 4.0–10.5)

## 2014-08-28 LAB — COMPREHENSIVE METABOLIC PANEL
ALT: 29 U/L (ref 0–53)
ANION GAP: 6 (ref 5–15)
AST: 27 U/L (ref 0–37)
Albumin: 3.5 g/dL (ref 3.5–5.2)
Alkaline Phosphatase: 67 U/L (ref 39–117)
BILIRUBIN TOTAL: 0.5 mg/dL (ref 0.3–1.2)
BUN: 34 mg/dL — AB (ref 6–23)
CALCIUM: 9.2 mg/dL (ref 8.4–10.5)
CO2: 27 mmol/L (ref 19–32)
CREATININE: 1.3 mg/dL (ref 0.50–1.35)
Chloride: 104 mEq/L (ref 96–112)
GFR calc non Af Amer: 59 mL/min — ABNORMAL LOW (ref >90)
GFR, EST AFRICAN AMERICAN: 69 mL/min — AB (ref >90)
GLUCOSE: 212 mg/dL — AB (ref 70–99)
Potassium: 4.7 mmol/L (ref 3.5–5.1)
Sodium: 137 mmol/L (ref 135–145)
Total Protein: 6.7 g/dL (ref 6.0–8.3)

## 2014-08-28 LAB — TROPONIN I: Troponin I: <0.03 ng/mL (ref <0.031)

## 2014-08-28 LAB — BRAIN NATRIURETIC PEPTIDE: B NATRIURETIC PEPTIDE 5: 62 pg/mL (ref 0.0–100.0)

## 2014-08-28 MED ORDER — NITROGLYCERIN 0.4 MG SL SUBL
SUBLINGUAL_TABLET | SUBLINGUAL | Status: AC
  Filled 2014-08-28: qty 3

## 2014-08-28 MED ORDER — BACITRACIN ZINC 500 UNIT/GM EX OINT
TOPICAL_OINTMENT | CUTANEOUS | Status: AC
  Administered 2014-08-28: 22:00:00
  Filled 2014-08-28: qty 1.8

## 2014-08-28 MED ORDER — ASPIRIN 81 MG PO CHEW
324.0000 mg | CHEWABLE_TABLET | Freq: Once | ORAL | Status: AC
  Administered 2014-08-28: 324 mg via ORAL
  Filled 2014-08-28: qty 4

## 2014-08-28 NOTE — ED Provider Notes (Signed)
CSN: [id number]     Arrival date & time 08/28/14  1954 History   First MD Initiated Contact with Patient 08/28/14 2001     This chart was scribed for No att. providers found by Forrestine Him, ED Scribe. This patient was seen in room APA15/APA15 and the patient's care was started 10:25 PM.   Chief Complaint  Patient presents with  . Shortness of Breath  . Leg Swelling   The history is provided by the patient. No language interpreter was used.    HPI Comments: Eugene [name] is a 59 y.o. male with a PMHx of ASCVD, hyperlipidemia, DM, HTN, bell palsy, COPD, MI, GERD, and asthma who presents to the Emergency Department complaining of constant, ongoing shortness of breath and leg/feet edema x 3-4 days. Pt has put on about 6 pounds since time of onset. He reports cough, chills, and a tightness to his chest onset this morning. Chest tightness is still persistent now and worsened with ambulation. He mentions ongoing choking when eating x 1 year which he is being followed by GI for. Pt currently has stents placed; initially put in 2 years ago. Last Echocardiogram 2 months ago. Lasix medication was recently increased 3 days ago and he was taken off of Norvasc.  He accidentally burned his right foot, while getting out of a chair and hitting it against a portable space heater, 2 days ago.  He had decreased feeling in his feet from his diabetes.  He is followed by Dr. Silvestre Mesi He is followed by Dr. Chuck Hint  Past Medical History  Diagnosis Date  . Arteriosclerotic cardiovascular disease (ASCVD)     a. 05/2011 Cath/PCI: LM nl, LAD 75m, D1 small, D2 small 9m, LCX large 40p, RCA 50-60p, 99 hazy @ origin of PDA with 70-80 in PDA (2.5x26 Resolute Integrity & 3.0x15 Resolute Integrity DES).;  b. 08/2012 Inflat  STEMI/Cath/PCI: LM minor irregs, LAD 50p, D1 50, LCX nl, OM1 25, RCA 30-40p, 100d (treated with 2.75x26mm Promus Premier DES);  c. 08/2012 Echo: EF 55-60%, basal inferopost HK.   [name] Hyperlipidemia   . Diabetes mellitus     Peripheral neuropathy  . Bell palsy   . Hypertension   . COPD (chronic obstructive pulmonary disease)   . Sleep apnea   . Gallstones   . Cholelithiasis 07/2012    Asymptomatic; identified incidentally  . PONV (postoperative nausea and vomiting)   . Anxiety   . C. difficile colitis     a. 08/2012  . Myocardial infarct 09/08/12  . Nephrolithiasis   . Contrast dye induced nephropathy     a. 08/2012 post cath/pci  . CHF (congestive heart failure)   . Asthma   . Heart disease   . Old myocardial infarction   . Anginal pain   . GERD (gastroesophageal reflux disease)    Past Surgical History  Procedure Laterality Date  . Circumcision    . Stents    . Esophagogastroduodenoscopy      in [place] over 20 yrs ago  . Colonoscopy      In [facility], approximately 2011 per patient, was normal. Advised to come back in 10 years.  . Esophagogastroduodenoscopy  06/12/2012    [id number] esophagus-status post Venia Minks dilation. Abnormal gastric mucosa of uncertain significance-status post biopsy  . Cardiac catheterization    . Subxyphoid pericardial window N/A 06/23/2013    Procedure: SUBXYPHOID PERICARDIAL WINDOW;  Surgeon: Grace Isaac, MD;  Location: [facility];  Service: Thoracic;  Laterality: N/A;  .  Intraoperative transesophageal echocardiogram N/A 06/23/2013    Procedure: INTRAOPERATIVE TRANSESOPHAGEAL ECHOCARDIOGRAM;  Surgeon: Grace Isaac, MD;  Location: [facility];  Service: Open Heart Surgery;  Laterality: N/A;  . Left heart catheterization with coronary angiogram N/A 09/08/2012    Procedure: LEFT HEART CATHETERIZATION WITH CORONARY ANGIOGRAM;  Surgeon: [name], MD;  Location: [facility] CATH LAB;  Service: Cardiovascular;  Laterality: N/A;   Family History  Problem Relation Age of Onset  . Diabetes Mother   . Heart attack Mother   . Stroke Mother   . Diabetes Sister   . Sleep apnea Sister   . Hypertension Brother   .  Diabetes Brother   . Colon cancer Neg Hx   . Liver disease Neg Hx   . Diabetes Brother   . Hypertension Brother    History  Substance Use Topics  . Smoking status: Never Smoker   . Smokeless tobacco: Never Used  . Alcohol Use: No     Comment: heavy etoh use 30 years ago    Review of Systems  Constitutional: Positive for chills. Negative for fever.  HENT: Positive for trouble swallowing.   Respiratory: Positive for cough, chest tightness and shortness of breath.   Cardiovascular: Positive for leg swelling.  All other systems reviewed and are negative.     Allergies  Hydrocodone; Lisinopril; Neurontin; Statins; Metformin and related; and Norvasc  Home Medications   Prior to Admission medications   Medication Sig Start Date End Date Taking? Authorizing Provider  ALPRAZolam Duanne Moron) 0.5 MG tablet Take 0.5 mg by mouth at bedtime as needed for sleep.   Yes Historical Provider, MD  aspirin EC 81 MG tablet Take 81 mg by mouth at bedtime.    Yes Historical Provider, MD  budesonide-formoterol (SYMBICORT) 160-4.5 MCG/ACT inhaler Inhale 2 puffs into the lungs 2 (two) times daily.    Yes Historical Provider, MD  clopidogrel (PLAVIX) 75 MG tablet Take 1 tablet (75 mg total) by mouth at bedtime. 02/19/14  Yes [name], MD  esomeprazole (NEXIUM) 20 MG capsule Take 20 mg by mouth daily at 12 noon.   Yes Historical Provider, MD  fenofibrate 160 MG tablet Take 160 mg by mouth at bedtime.   Yes Historical Provider, MD  hydrALAZINE (APRESOLINE) 25 MG tablet Take 1.5 tablets (37.5 mg total) by mouth 3 (three) times daily. 08/12/14  Yes [name], MD  insulin lispro (HUMALOG KWIKPEN) 100 UNIT/ML KiwkPen 5 units are breakfast. 5 units at lunch. 7 units at supper 08/12/14  Yes [name], MD  LANTUS SOLOSTAR 100 UNIT/ML Solostar Pen Inject 80 Units into the skin at bedtime.  05/27/14  Yes Historical Provider, MD  losartan (COZAAR) 100 MG tablet Take 100 mg by mouth daily.  06/09/14  Yes  Historical Provider, MD  metoprolol tartrate (LOPRESSOR) 25 MG tablet Take 1 tablet (25 mg total) by mouth 2 (two) times daily. 01/29/14  Yes [name], MD  torsemide (DEMADEX) 20 MG tablet Take 0.5-1 tablets (10-20 mg total) by mouth 2 (two) times daily. Take 1 tab po qam and 0.5 tab po qpm Patient taking differently: Take 40 mg by mouth daily.  01/29/14  Yes [name], MD  nitroGLYCERIN (NITROSTAT) 0.4 MG SL tablet Place 0.4 mg under the tongue every 5 (five) minutes as needed for chest pain.    Historical Provider, MD  rosuvastatin (CRESTOR) 10 MG tablet Take 1 tablet (10 mg total) by mouth daily. Patient not taking: Reported on 08/12/2014 06/25/14  Herminio Commons, MD   Physical Exam  Constitutional: He is oriented to person, place, and time. He appears well-developed and well-nourished.  HENT:  Head: Normocephalic and atraumatic.  Right Ear: External ear normal.  Left Ear: External ear normal.  Eyes: Conjunctivae and EOM are normal. Pupils are equal, round, and reactive to light.  Neck: Normal range of motion and phonation normal. Neck supple.  Cardiovascular: Normal rate, regular rhythm and normal heart sounds.  Exam reveals no gallop and no friction rub.   No murmur heard. Pulmonary/Chest: Effort normal and breath sounds normal. He exhibits no bony tenderness.  Good air movement  Abdominal: Soft. There is no tenderness.  Musculoskeletal: Normal range of motion.  3 plus peripheral edema bilaterally  Neurological: He is alert and oriented to person, place, and time. No cranial nerve deficit or sensory deficit. He exhibits normal muscle tone. Coordination normal.  Skin: Skin is warm, dry and intact.  Right mid lateral foot with a 4 cm flat denuded area with erythematous tissue beneath.  There is a small amount of serous discharge extruding from this lesion.  There is no surrounding erythema, or fluctuance.  This wound has a appearance of a subacute partial skin thickness burn.   Psychiatric: He has a normal mood and affect. His behavior is normal. Judgment and thought content normal.  Nursing note and vitals reviewed.   ED Course  Procedures (including critical care time)  Initial blood pressure has been arterial pressure of 144 mmHg.  On 08/12/14, at office visit, his MEP was 107.  Weight today is unchanged from 08/12/2014.    DIAGNOSTIC STUDIES: Oxygen Saturation is 96% on RA, adequate by my interpretation.    COORDINATION OF CARE:  Medications  nitroGLYCERIN (NITROSTAT) 0.4 MG SL tablet (  Not Given 08/28/14 2212)  aspirin chewable tablet 324 mg (324 mg Oral Given 08/28/14 2016)  bacitracin 500 UNIT/GM ointment (  Given 08/28/14 2212)     Patient Vitals for the past 24 hrs:  BP Temp Temp src Pulse Resp SpO2 Height Weight  08/28/14 2130 188/96 mmHg - - 78 - - - -  08/28/14 2100 168/94 mmHg - - 76 - - - -  08/28/14 2034 - - - - - - - 277 lb 9 oz (125.902 kg)  08/28/14 2030 199/89 mmHg - - 82 (!) 27 - - -  08/28/14 2007 (!) 219/107 mmHg 99.4 F (37.4 C) Oral 90 21 96 % 6\' 1"  (1.854 m) 276 lb (125.193 kg)     10:25 PM-Discussed treatment plan with pt at bedside and pt agreed to plan.     9:12 PM Reevaluation with update and discussion. After initial assessment and treatment, an updated evaluation reveals no chest pain or shortness of breath at this time.  Blood pressure spontaneously improved.Daleen Bo L    21:40- he continues to be pain-free and is not short of breath at this time  Labs Review Labs Reviewed  CBC WITH DIFFERENTIAL - Abnormal; Notable for the following:    RBC 3.48 (*)    Hemoglobin 10.7 (*)    HCT 31.3 (*)    All other components within normal limits  COMPREHENSIVE METABOLIC PANEL - Abnormal; Notable for the following:    Glucose, Bld 212 (*)    BUN 34 (*)    GFR calc non Af Amer 59 (*)    GFR calc Af Amer 69 (*)    All other components within normal limits  TROPONIN I  BRAIN NATRIURETIC  PEPTIDE    Imaging Review Dg  Chest Port 1 View  08/28/2014   CLINICAL DATA:  Shortness of breath and leg swelling.  EXAM: PORTABLE CHEST - 1 VIEW  COMPARISON:  July 03, 2014  FINDINGS: The mediastinal contour is normal. The heart size is enlarged. There is increased pulmonary interstitium and central pulmonary vessel caliber. There is no focal pneumonia or pleural effusion. The visualized skeletal structures are stable.  IMPRESSION: Mild congestive heart failure.   Electronically Signed   By: Abelardo Diesel M.D.   On: 08/28/2014 20:35     EKG Interpretation   Date/Time:  Saturday August 28 2014 20:06:40 EST Ventricular Rate:  90 PR Interval:  182 QRS Duration: 98 QT Interval:  352 QTC Calculation: 431 R Axis:   83 Text Interpretation:  Sinus rhythm Low voltage, precordial leads Probable  anteroseptal infarct, old since last tracing no significant change  Confirmed by [facility]  MD, Jenaya Saar [phone]) on 08/28/2014 8:19:28 PM      MDM   Final diagnoses:  Chest pain, unspecified chest pain type  Dyspnea  Peripheral edema  Burn, foot, second degree, right, initial encounter    Chronic peripheral edema, without decompensated heart failure.  Blood pressure spontaneously improved with rest. Edema is likely multifactorial.  There is an incidental burn of his right foot which does not appear to be a diabetic foot infection at this time.  There is no indication for change in his medical treatment, plan, or admission to the hospital.   Nursing Notes Reviewed/ Care Coordinated Applicable Imaging Reviewed Interpretation of Laboratory Data incorporated into ED treatment  The patient appears reasonably screened and/or stabilized for discharge and I doubt any other medical condition or other [facility] requiring further screening, evaluation, or treatment in the ED at this time prior to discharge.  Plan: Home Medications- usual; Home Treatments- wound care right foot burn, rest, elevation; return here if the recommended treatment, does  not improve the symptoms; Recommended follow up- PCP 1 wee.  I personally performed the services described in this documentation, which was scribed in my presence. The recorded information has been reviewed and is accurate.    Richarda Blade, MD 08/28/14 2227

## 2014-08-28 NOTE — Discharge Instructions (Signed)
Clean the burn on your right foot twice a day with soap and water, rinse well, then apply a dressing with bacitracin. For your leg edema, elevate your feet above your heart, as much as possible.    Burn Care Your skin is a natural barrier to infection. It is the largest organ of your body. Burns damage this natural protection. To help prevent infection, it is very important to follow your caregiver's instructions in the care of your burn. Burns are classified as:  First degree. There is only redness of the skin (erythema). No scarring is expected.  Second degree. There is blistering of the skin. Scarring may occur with deeper burns.  Third degree. All layers of the skin are injured, and scarring is expected. HOME CARE INSTRUCTIONS   Wash your hands well before changing your bandage.  Change your bandage as often as directed by your caregiver.  Remove the old bandage. If the bandage sticks, you may soak it off with cool, clean water.  Cleanse the burn thoroughly but gently with mild soap and water.  Pat the area dry with a clean, dry cloth.  Apply a thin layer of antibacterial cream to the burn.  Apply a clean bandage as instructed by your caregiver.  Keep the bandage as clean and dry as possible.  Elevate the affected area for the first 24 hours, then as instructed by your caregiver.  Only take over-the-counter or prescription medicines for pain, discomfort, or fever as directed by your caregiver. SEEK IMMEDIATE MEDICAL CARE IF:   You develop excessive pain.  You develop redness, tenderness, swelling, or red streaks near the burn.  The burned area develops yellowish-white fluid (pus) or a bad smell.  You have a fever. MAKE SURE YOU:   Understand these instructions.  Will watch your condition.  Will get help right away if you are not doing well or get worse. Document Released: 08/13/2005 Document Revised: 11/05/2011 Document Reviewed: 01/03/2011 [facility] Patient  Information 2015 [place], [place]. This information is not intended to replace advice given to you by your health care provider. Make sure you discuss any questions you have with your health care provider.  Chest Pain (Nonspecific) It is often hard to give a specific diagnosis for the cause of chest pain. There is always a chance that your pain could be related to something serious, such as a heart attack or a blood clot in the lungs. You need to follow up with your health care provider for further evaluation. CAUSES   Heartburn.  Pneumonia or bronchitis.  Anxiety or stress.  Inflammation around your heart (pericarditis) or lung (pleuritis or pleurisy).  A blood clot in the lung.  A collapsed lung (pneumothorax). It can develop suddenly on its own (spontaneous pneumothorax) or from trauma to the chest.  Shingles infection (herpes zoster virus). The chest wall is composed of bones, muscles, and cartilage. Any of these can be the source of the pain.  The bones can be bruised by injury.  The muscles or cartilage can be strained by coughing or overwork.  The cartilage can be affected by inflammation and become sore (costochondritis). DIAGNOSIS  Lab tests or other studies may be needed to find the cause of your pain. Your health care provider may have you take a test called an ambulatory electrocardiogram (ECG). An ECG records your heartbeat patterns over a 24-hour period. You may also have other tests, such as:  Transthoracic echocardiogram (TTE). During echocardiography, sound waves are used to evaluate how blood  flows through your heart.  Transesophageal echocardiogram (TEE).  Cardiac monitoring. This allows your health care provider to monitor your heart rate and rhythm in real time.  Holter monitor. This is a portable device that records your heartbeat and can help diagnose heart arrhythmias. It allows your health care provider to track your heart activity for several days, if  needed.  Stress tests by exercise or by giving medicine that makes the heart beat faster. TREATMENT   Treatment depends on what may be causing your chest pain. Treatment may include:  Acid blockers for heartburn.  Anti-inflammatory medicine.  Pain medicine for inflammatory conditions.  Antibiotics if an infection is present.  You may be advised to change lifestyle habits. This includes stopping smoking and avoiding alcohol, caffeine, and chocolate.  You may be advised to keep your head raised (elevated) when sleeping. This reduces the chance of acid going backward from your stomach into your esophagus. Most of the time, nonspecific chest pain will improve within 2-3 days with rest and mild pain medicine.  HOME CARE INSTRUCTIONS   If antibiotics were prescribed, take them as directed. Finish them even if you start to feel better.  For the next few days, avoid physical activities that bring on chest pain. Continue physical activities as directed.  Do not use any tobacco products, including cigarettes, chewing tobacco, or electronic cigarettes.  Avoid drinking alcohol.  Only take medicine as directed by your health care provider.  Follow your health care provider's suggestions for further testing if your chest pain does not go away.  Keep any follow-up appointments you made. If you do not go to an appointment, you could develop lasting (chronic) problems with pain. If there is any problem keeping an appointment, call to reschedule. SEEK MEDICAL CARE IF:   Your chest pain does not go away, even after treatment.  You have a rash with blisters on your chest.  You have a fever. SEEK IMMEDIATE MEDICAL CARE IF:   You have increased chest pain or pain that spreads to your arm, neck, jaw, back, or abdomen.  You have shortness of breath.  You have an increasing cough, or you cough up blood.  You have severe back or abdominal pain.  You feel nauseous or vomit.  You have severe  weakness.  You faint.  You have chills. This is an emergency. Do not wait to see if the pain will go away. Get medical help at once. Call your local emergency services (911 in [place] Do not drive yourself to the hospital. MAKE SURE YOU:   Understand these instructions.  Will watch your condition.  Will get help right away if you are not doing well or get worse. Document Released: 05/23/2005 Document Revised: 08/18/2013 Document Reviewed: 03/18/2008 [facility] Patient Information 2015 [place], [place]. This information is not intended to replace advice given to you by your health care provider. Make sure you discuss any questions you have with your health care provider.

## 2014-08-28 NOTE — ED Notes (Signed)
Pt c/o SOB and leg/feet edema x 3-4 days and chest tightness beginning today.

## 2014-09-15 ENCOUNTER — Encounter: Payer: Commercial Managed Care - HMO | Admitting: Family Medicine

## 2014-09-21 ENCOUNTER — Ambulatory Visit (INDEPENDENT_AMBULATORY_CARE_PROVIDER_SITE_OTHER): Payer: Commercial Managed Care - HMO | Admitting: Family Medicine

## 2014-09-21 ENCOUNTER — Encounter: Payer: Self-pay | Admitting: Family Medicine

## 2014-09-21 VITALS — BP 136/84 | Ht 73.0 in | Wt 277.2 lb

## 2014-09-21 DIAGNOSIS — E118 Type 2 diabetes mellitus with unspecified complications: Secondary | ICD-10-CM | POA: Diagnosis not present

## 2014-09-21 DIAGNOSIS — R0600 Dyspnea, unspecified: Secondary | ICD-10-CM | POA: Diagnosis not present

## 2014-09-21 NOTE — Progress Notes (Signed)
   Subjective:    Patient ID: Eugene [name], male    DOB: 1956-05-02, 59 y.o.   MRN: [id number]  HPI  Patient arrives to follow up on diabetes  Patient has not started the Humalog as ordered at last visit but plans on getting it tommorrow, we reviewed in detail how to do baseline insulin with a sliding scale. The patient is concerned about his breathing he states whenever he does physical exertion he gets short of breath cardiologist told him that this effusion around his heart is less felt that he should be getting better. Patient is somewhat frustrated by how he is doing. He denies chest pressure tightness or pain Review of Systems  Constitutional: Negative for activity change, appetite change and fatigue.  HENT: Negative for congestion.   Respiratory: Positive for shortness of breath. Negative for cough, chest tightness and wheezing.   Cardiovascular: Negative for chest pain and leg swelling.  Gastrointestinal: Negative for abdominal pain.  Endocrine: Negative for polydipsia and polyphagia.  Neurological: Negative for weakness.  Psychiatric/Behavioral: Negative for confusion.       Objective:   Physical Exam  Constitutional: He appears well-nourished. No distress.  Cardiovascular: Normal rate, regular rhythm and normal heart sounds.   No murmur heard. Pulmonary/Chest: Effort normal and breath sounds normal. No respiratory distress. He has no wheezes. He has no rales.  Musculoskeletal: He exhibits no edema.  Lymphadenopathy:    He has no cervical adenopathy.  Neurological: He is alert.  Psychiatric: His behavior is normal.  Vitals reviewed.         Assessment & Plan:  DM- discussed the sliding scale in detail Dyspnea- related to pericardial effusion but needs PFT may need ABG Doubt angina

## 2014-09-24 ENCOUNTER — Ambulatory Visit (HOSPITAL_COMMUNITY)
Admission: RE | Admit: 2014-09-24 | Discharge: 2014-09-24 | Disposition: A | Discharge status: Home / Self Care | Payer: Commercial Managed Care - HMO | Source: Ambulatory Visit | Attending: Family Medicine | Admitting: Family Medicine

## 2014-09-24 DIAGNOSIS — R0600 Dyspnea, unspecified: Secondary | ICD-10-CM | POA: Diagnosis not present

## 2014-09-24 LAB — PULMONARY FUNCTION TEST
FEF 25-75 PRE: 3.32 L/s
FEF 25-75 Post: 3.93 L/sec
FEF2575-%Change-Post: 18 %
FEF2575-%Pred-Post: 116 %
FEF2575-%Pred-Pre: 98 %
FEV1-%Change-Post: 4 %
FEV1-%PRED-POST: 57 %
FEV1-%Pred-Pre: 54 %
FEV1-POST: 2.35 L
FEV1-Pre: 2.24 L
FEV1FVC-%CHANGE-POST: 4 %
FEV1FVC-%Pred-Pre: 112 %
FEV6-%Change-Post: 0 %
FEV6-%PRED-POST: 51 %
FEV6-%PRED-PRE: 50 %
FEV6-Post: 2.63 L
FEV6-Pre: 2.62 L
FEV6FVC-%Change-Post: 0 %
FEV6FVC-%PRED-PRE: 104 %
FEV6FVC-%Pred-Post: 104 %
FVC-%CHANGE-POST: 0 %
FVC-%PRED-POST: 49 %
FVC-%PRED-PRE: 48 %
FVC-Post: 2.63 L
FVC-Pre: 2.62 L
PRE FEV1/FVC RATIO: 86 %
Post FEV1/FVC ratio: 89 %
Post FEV6/FVC ratio: 100 %
Pre FEV6/FVC Ratio: 100 %

## 2014-09-24 MED ORDER — ALBUTEROL SULFATE (2.5 MG/3ML) 0.083% IN NEBU
2.5000 mg | INHALATION_SOLUTION | Freq: Once | RESPIRATORY_TRACT | Status: AC
  Administered 2014-09-24: 2.5 mg via RESPIRATORY_TRACT

## 2014-09-28 ENCOUNTER — Telehealth: Payer: Self-pay | Admitting: *Deleted

## 2014-09-28 ENCOUNTER — Other Ambulatory Visit: Payer: Self-pay | Admitting: *Deleted

## 2014-09-28 ENCOUNTER — Other Ambulatory Visit (HOSPITAL_COMMUNITY)
Admission: RE | Admit: 2014-09-28 | Discharge: 2014-09-28 | Disposition: A | Discharge status: Home / Self Care | Payer: Commercial Managed Care - HMO | Source: Ambulatory Visit | Attending: Family Medicine | Admitting: Family Medicine

## 2014-09-28 ENCOUNTER — Encounter: Payer: Self-pay | Admitting: *Deleted

## 2014-09-28 DIAGNOSIS — R609 Edema, unspecified: Secondary | ICD-10-CM | POA: Diagnosis not present

## 2014-09-28 DIAGNOSIS — I739 Peripheral vascular disease, unspecified: Secondary | ICD-10-CM | POA: Diagnosis not present

## 2014-09-28 DIAGNOSIS — G629 Polyneuropathy, unspecified: Secondary | ICD-10-CM | POA: Insufficient documentation

## 2014-09-28 LAB — BASIC METABOLIC PANEL
ANION GAP: 5 (ref 5–15)
BUN: 51 mg/dL — AB (ref 6–23)
CALCIUM: 9.1 mg/dL (ref 8.4–10.5)
CO2: 26 mmol/L (ref 19–32)
Chloride: 103 mmol/L (ref 96–112)
Creatinine, Ser: 1.61 mg/dL — ABNORMAL HIGH (ref 0.50–1.35)
GFR calc Af Amer: 53 mL/min — ABNORMAL LOW (ref >90)
GFR, EST NON AFRICAN AMERICAN: 46 mL/min — AB (ref >90)
GLUCOSE: 267 mg/dL — AB (ref 70–99)
POTASSIUM: 4.9 mmol/L (ref 3.5–5.1)
Sodium: 134 mmol/L — ABNORMAL LOW (ref 135–145)

## 2014-09-28 LAB — D-DIMER, QUANTITATIVE (NOT AT ARMC): D DIMER QUANT: 0.3 ug{FEU}/mL (ref 0.00–0.48)

## 2014-09-28 MED ORDER — ALBUTEROL SULFATE HFA 108 (90 BASE) MCG/ACT IN AERS: 2.0000 | INHALATION_SPRAY | RESPIRATORY_TRACT | Status: DC | PRN

## 2014-09-28 NOTE — Telephone Encounter (Signed)
Sending in nebulizer machine with supplies for COPD. Pt requested. Wanted this instead of HFA d/t cost.

## 2014-09-28 NOTE — Addendum Note (Signed)
Addended byCharolotte Capuchin D on: 09/28/2014 08:25 AM   Modules accepted: Orders

## 2014-09-28 NOTE — Progress Notes (Signed)
Patient notified and verbalized understanding of the test results. No further questions. Transferred up front to schedule OV with Dr. Nicki Reaper

## 2014-09-28 NOTE — Telephone Encounter (Signed)
error 

## 2014-09-28 NOTE — Progress Notes (Signed)
BW orders are in as STAT. Pt notified and verbalized understanding.

## 2014-10-04 ENCOUNTER — Encounter: Payer: Self-pay | Admitting: Cardiovascular Disease

## 2014-10-04 ENCOUNTER — Ambulatory Visit (INDEPENDENT_AMBULATORY_CARE_PROVIDER_SITE_OTHER): Payer: Commercial Managed Care - HMO | Admitting: Cardiovascular Disease

## 2014-10-04 VITALS — BP 118/70 | HR 67 | Ht 73.0 in | Wt 276.0 lb

## 2014-10-04 DIAGNOSIS — I5032 Chronic diastolic (congestive) heart failure: Secondary | ICD-10-CM | POA: Diagnosis not present

## 2014-10-04 DIAGNOSIS — I10 Essential (primary) hypertension: Secondary | ICD-10-CM

## 2014-10-04 DIAGNOSIS — E782 Mixed hyperlipidemia: Secondary | ICD-10-CM | POA: Diagnosis not present

## 2014-10-04 DIAGNOSIS — I319 Disease of pericardium, unspecified: Secondary | ICD-10-CM | POA: Diagnosis not present

## 2014-10-04 DIAGNOSIS — R1012 Left upper quadrant pain: Secondary | ICD-10-CM | POA: Diagnosis not present

## 2014-10-04 DIAGNOSIS — Z955 Presence of coronary angioplasty implant and graft: Secondary | ICD-10-CM

## 2014-10-04 DIAGNOSIS — R600 Localized edema: Secondary | ICD-10-CM

## 2014-10-04 DIAGNOSIS — N183 Chronic kidney disease, stage 3 unspecified: Secondary | ICD-10-CM

## 2014-10-04 DIAGNOSIS — I2510 Atherosclerotic heart disease of native coronary artery without angina pectoris: Secondary | ICD-10-CM

## 2014-10-04 DIAGNOSIS — I3139 Other pericardial effusion (noninflammatory): Secondary | ICD-10-CM

## 2014-10-04 DIAGNOSIS — J449 Chronic obstructive pulmonary disease, unspecified: Secondary | ICD-10-CM

## 2014-10-04 DIAGNOSIS — I313 Pericardial effusion (noninflammatory): Secondary | ICD-10-CM

## 2014-10-04 DIAGNOSIS — I252 Old myocardial infarction: Secondary | ICD-10-CM

## 2014-10-04 MED ORDER — TORSEMIDE 20 MG PO TABS: ORAL_TABLET | ORAL | Status: DC

## 2014-10-04 NOTE — Patient Instructions (Addendum)
   Change Torsemide to 20mg  alternating with 40mg  every other day  Continue all other medications.   Lab for BMET - do in 10 days approximately.  (PMD Managing) Office will contact with results via phone or letter.    Lipids - repeat in 3 months  Your physician wants you to follow up in: 6 months.  You will receive a reminder letter in the mail one-two months in advance.  If you don't receive a letter, please call our office to schedule the follow up appointment

## 2014-10-04 NOTE — Progress Notes (Signed)
Patient ID: Eugene [name], male   DOB: 1956/03/08, 59 y.o.   MRN: [id number]      SUBJECTIVE: The patient returns for routine follow-up. He has been having problems with dyspnea on exertion and is being treated for COPD. He was evaluated for shortness of breath in the emergency room in early January. Chest x-ray showed mild CHF and BNP was normal. ECG was without acute ischemic abnormalities. BUN and creatinine were elevated from baseline on 2/2 (51/1.61 from 34/1.3 one month prior). He is currently taking torsemide 40 mg daily. Nuclear stress testing on 12/14/2013 showed myocardial scar in the anterior, inferior, and septal walls with no evidence of inducible ischemia. Echocardiogram on 07/21/2014 showed normal left ventricular systolic function, EF [id number], severe LVH, and a small to moderate size circumferential pericardial effusion with no evidence of tamponade.  He has been exercising at the [facility] and doing an hour of pool exercises. Since he started albuterol nebulizers his shortness of breath has been well controlled. His only complaint relates to left upper quadrant abdominal pain. He had abdominal CTs in February 2015 and October 2014 which were unremarkable for abdominal pathology. His last colonoscopy was in 2011.   Review of Systems: As per "subjective", otherwise negative.  Allergies  Allergen Reactions  . Hydrocodone Nausea And Vomiting  . Lisinopril Cough  . Neurontin [Gabapentin] Other (See Comments)    Suicidal thoughts  . Statins Other (See Comments)    Muscle aches  . Metformin And Related Diarrhea and Other (See Comments)    Intestinal side effects  . Norvasc [Amlodipine Besylate] Swelling    Pedal edema    Current Outpatient Prescriptions  Medication Sig Dispense Refill  . albuterol (PROVENTIL HFA;VENTOLIN HFA) 108 (90 BASE) MCG/ACT inhaler Inhale 2 puffs into the lungs every 4 (four) hours as needed for shortness of breath. 1 Inhaler 2  . ALPRAZolam (XANAX) 0.5 MG  tablet Take 0.5 mg by mouth at bedtime as needed for sleep.    [name] aspirin EC 81 MG tablet Take 81 mg by mouth at bedtime.     . clopidogrel (PLAVIX) 75 MG tablet Take 1 tablet (75 mg total) by mouth at bedtime. 90 tablet 1  . esomeprazole (NEXIUM) 20 MG capsule Take 20 mg by mouth daily at 12 noon.    . fenofibrate 160 MG tablet Take 160 mg by mouth at bedtime.    . hydrALAZINE (APRESOLINE) 25 MG tablet Take 1.5 tablets (37.5 mg total) by mouth 3 (three) times daily. 405 tablet 1  . insulin lispro (HUMALOG KWIKPEN) 100 UNIT/ML KiwkPen 5 units are breakfast. 5 units at lunch. 7 units at supper 15 mL 3  . LANTUS SOLOSTAR 100 UNIT/ML Solostar Pen Inject 80 Units into the skin at bedtime.     [name] losartan (COZAAR) 100 MG tablet Take 100 mg by mouth daily.     . metoprolol tartrate (LOPRESSOR) 25 MG tablet Take 1 tablet (25 mg total) by mouth 2 (two) times daily. 60 tablet 3  . nitroGLYCERIN (NITROSTAT) 0.4 MG SL tablet Place 0.4 mg under the tongue every 5 (five) minutes as needed for chest pain.    [name] torsemide (DEMADEX) 20 MG tablet Take 0.5-1 tablets (10-20 mg total) by mouth 2 (two) times daily. Take 1 tab po qam and 0.5 tab po qpm (Patient taking differently: Take 40 mg by mouth daily. ) 45 tablet 1   No current facility-administered medications for this visit.    Past Medical History  Diagnosis Date  .  Arteriosclerotic cardiovascular disease (ASCVD)     a. 05/2011 Cath/PCI: LM nl, LAD 24m, D1 small, D2 small 25m, LCX large 40p, RCA 50-60p, 99 hazy @ origin of PDA with 70-80 in PDA (2.5x26 Resolute Integrity & 3.0x15 Resolute Integrity DES).;  b. 08/2012 Inflat  STEMI/Cath/PCI: LM minor irregs, LAD 50p, D1 50, LCX nl, OM1 25, RCA 30-40p, 100d (treated with 2.75x77mm Promus Premier DES);  c. 08/2012 Echo: EF 55-60%, basal inferopost HK.  [name] Hyperlipidemia   . Diabetes mellitus     Peripheral neuropathy  . Bell palsy   . Hypertension   . COPD (chronic obstructive pulmonary disease)   . Sleep apnea    . Gallstones   . Cholelithiasis 07/2012    Asymptomatic; identified incidentally  . PONV (postoperative nausea and vomiting)   . Anxiety   . C. difficile colitis     a. 08/2012  . Myocardial infarct 09/08/12  . Nephrolithiasis   . Contrast dye induced nephropathy     a. 08/2012 post cath/pci  . CHF (congestive heart failure)   . Asthma   . Heart disease   . Old myocardial infarction   . Anginal pain   . GERD (gastroesophageal reflux disease)     Past Surgical History  Procedure Laterality Date  . Circumcision    . Stents    . Esophagogastroduodenoscopy      in [place] over 20 yrs ago  . Colonoscopy      In [facility], approximately 2011 per patient, was normal. Advised to come back in 10 years.  . Esophagogastroduodenoscopy  06/12/2012    [id number] esophagus-status post Venia Minks dilation. Abnormal gastric mucosa of uncertain significance-status post biopsy  . Cardiac catheterization    . Subxyphoid pericardial window N/A 06/23/2013    Procedure: SUBXYPHOID PERICARDIAL WINDOW;  Surgeon: Grace Isaac, MD;  [facility];  Service: Thoracic;  Laterality: N/A;  . Intraoperative transesophageal echocardiogram N/A 06/23/2013    Procedure: INTRAOPERATIVE TRANSESOPHAGEAL ECHOCARDIOGRAM;  Surgeon: Grace Isaac, MD;  Location: [place];  Service: Open Heart Surgery;  Laterality: N/A;  . Left heart catheterization with coronary angiogram N/A 09/08/2012    Procedure: LEFT HEART CATHETERIZATION WITH CORONARY ANGIOGRAM;  Surgeon: [name], MD;  Location: [facility] CATH LAB;  Service: Cardiovascular;  Laterality: N/A;    History   Social History  . Marital Status: Married    Spouse Name: N/A    Number of Children: 2  . Years of Education: N/A   Occupational History  . Shipping     [employer]   Social History Main Topics  . Smoking status: Never Smoker   . Smokeless tobacco: Never Used  . Alcohol Use: No     Comment: heavy etoh use 30 years ago  . Drug  Use: No  . Sexual Activity: Yes    Birth Control/ Protection: None   Other Topics Concern  . 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 Disabled at this point.     Filed Vitals:   10/04/14 1313  BP: 118/70  Pulse: 67  Height: 6\' 1"  (1.854 m)  Weight: 276 lb (125.193 kg)  SpO2: 95%    PHYSICAL EXAM General: NAD HEENT: Normal. Neck: No JVD, no thyromegaly. Lungs: Clear to auscultation bilaterally with normal respiratory effort. CV: Nondisplaced PMI. Regular rate and rhythm, normal S1/S2, no S3/S4, no murmur. Trace pretibial and periankle edema.  Abdomen: Firm, obese, nontender.  Neurologic:  Alert and oriented x 3.  Psych: Normal affect. Skin: Normal. Musculoskeletal: Normal range of motion, no gross deformities. Extremities: No clubbing or cyanosis.   ECG: Most recent ECG reviewed.     ASSESSMENT AND PLAN: 1. CAD s/p multiple PCI's to RCA: Stable ischemic heart disease. Continue aspirin, metoprolol, and Plavix. Did not take Crestor..  2. Essential HTN: Well controlled today. Has been taking hydralazine 37.5 mg tid along with losartan 100 mg daily.  3. Chronic diastolic heart failure: Euvolemic. Continue low-sodium and fluid-restricted diet. BP well controlled. Will reduce diuretic regimen given worsening renal function to torsemide 40 mg and 20 mg on alternate days, and repeat a BMET in 10 days. 4. Mixed dyslipidemia: Markedly elevated lipids in 9/15 in context of diabetes and CAD. Intolerant to statins (muscle aches). Did not take Crestor. Continue flaxseed oil, fenofibrate, and therapeutic lifestyle changes. Will add Zetia 10 mg daily if patient amenable at next visit, as he is trying some home remedies now. Repeat lipids in 3 months. 5. Pericardial effusion s/p pericardial window: Small to moderate in size and stable in 11/15. Continue to monitor.  6. COPD: Stable on present regimen and markedly improved with  albuterol nebs. 7. Type 2 diabetes: Stable. No changes to therapy. 8. CKD stage 3: Repeat BMET in 10 days given reduced diuretic regimen. 9. Abdominal pain: Unremarkable CT's in 2/15 and 10/14. Recommended he see GI and consider colonoscopy.  Dispo: f/u in 6 months.   Kate Sable, M.D., F.A.C.C.

## 2014-10-06 ENCOUNTER — Ambulatory Visit: Payer: Medicare HMO | Admitting: Nurse Practitioner

## 2014-10-26 ENCOUNTER — Ambulatory Visit (INDEPENDENT_AMBULATORY_CARE_PROVIDER_SITE_OTHER): Payer: Commercial Managed Care - HMO | Admitting: Nurse Practitioner

## 2014-10-26 ENCOUNTER — Ambulatory Visit: Payer: Commercial Managed Care - HMO | Admitting: Family Medicine

## 2014-10-26 ENCOUNTER — Other Ambulatory Visit: Payer: Self-pay | Admitting: Family Medicine

## 2014-10-26 ENCOUNTER — Encounter: Payer: Self-pay | Admitting: Nurse Practitioner

## 2014-10-26 ENCOUNTER — Other Ambulatory Visit: Payer: Self-pay | Admitting: Nurse Practitioner

## 2014-10-26 ENCOUNTER — Ambulatory Visit (HOSPITAL_COMMUNITY)
Admission: RE | Admit: 2014-10-26 | Discharge: 2014-10-26 | Disposition: A | Discharge status: Home / Self Care | Payer: Commercial Managed Care - HMO | Source: Ambulatory Visit | Attending: Nurse Practitioner | Admitting: Nurse Practitioner

## 2014-10-26 ENCOUNTER — Other Ambulatory Visit: Payer: Self-pay | Admitting: Cardiovascular Disease

## 2014-10-26 VITALS — BP 193/100 | HR 85 | Temp 97.9°F | Ht 73.0 in | Wt 276.4 lb

## 2014-10-26 DIAGNOSIS — K5900 Constipation, unspecified: Secondary | ICD-10-CM

## 2014-10-26 DIAGNOSIS — R109 Unspecified abdominal pain: Secondary | ICD-10-CM | POA: Diagnosis not present

## 2014-10-26 DIAGNOSIS — I5033 Acute on chronic diastolic (congestive) heart failure: Secondary | ICD-10-CM | POA: Diagnosis not present

## 2014-10-26 DIAGNOSIS — R609 Edema, unspecified: Secondary | ICD-10-CM | POA: Diagnosis not present

## 2014-10-26 NOTE — Assessment & Plan Note (Signed)
Constipation with large, hard, infrequent stools and associated bloating and left side abdominal pain. Has tried stool softeners. No red flag/warning signs. Will check abdominal XRay and TSH. Add Linzess for idiopathic constipation as well as fiber supplement. If too much can back off on the Linzess or try alternative treatments. Follow-up in 4 weeks to reassess.

## 2014-10-26 NOTE — Progress Notes (Signed)
CC'ED TO PCP 

## 2014-10-26 NOTE — Assessment & Plan Note (Addendum)
Constipation with large, hard, infrequent stools and associated bloating and left side abdominal pain. Has tried stool softeners. No red flag/warning signs. Will check abdominal XRay and TSH. Add Linzess for idiopathic constipation as well as fiber supplement. If too much can back off on the Linzess or try alternative treatments. Follow-up in 4 weeks to reassess. Last clonoscopy a few years ago and per patient recommended repeat in 10 years. Will request these records.

## 2014-10-26 NOTE — Progress Notes (Signed)
Referring Provider: [name], MD Primary Care Physician:  [name], MD Primary GI:  Dr. Gala Romney  Chief Complaint  Patient presents with  . Abdominal Pain    Left sided pain    HPI:   59 year old male presents c/o left side abdominal pain. Last seen 04/2012 and subsequently had an EGD 05/2012. Records reviewed and showed normal-appearing patent tubular esophagus s/p Maloney 54-Fr dilation, mosaic gastric mucosa without ulcer and bx taken, normal first and second duodenum. Pathology lab report reviewed which shows gastric mucosa with mild chronic inflammation, no H. Pylori.  Today he states he's been having left side pain which feels spasmy. Pain is constant at a low level but becomes worse and then better episodically. Symptoms started about 6-7 months ago. Also has chronic constipation which is often a 1 on the Bristol stool scale. Hasn't had a BM in the past 5 days. Has tried OTC stool softener without relief, no other medications tried. Last BM was large and solid. States he doesn't drink enough water, eats about 2-3 servings of fiber-rich foods. States he also has some associated gas. Patient can't tell if the abdominal pain is relieved by bowel movement. Denies any hematochezia or melena. Denies any other upper and lower GI symptoms.   Past Medical History  Diagnosis Date  . Arteriosclerotic cardiovascular disease (ASCVD)     a. 05/2011 Cath/PCI: LM nl, LAD 6m, D1 small, D2 small 55m, LCX large 40p, RCA 50-60p, 99 hazy @ origin of PDA with 70-80 in PDA (2.5x26 Resolute Integrity & 3.0x15 Resolute Integrity DES).;  b. 08/2012 Inflat  STEMI/Cath/PCI: LM minor irregs, LAD 50p, D1 50, LCX nl, OM1 25, RCA 30-40p, 100d (treated with 2.75x40mm Promus Premier DES);  c. 08/2012 Echo: EF 55-60%, basal inferopost HK.  [name] Hyperlipidemia   . Diabetes mellitus     Peripheral neuropathy  . Bell palsy   . Hypertension   . COPD (chronic obstructive pulmonary disease)   . Sleep apnea   .  Gallstones   . Cholelithiasis 07/2012    Asymptomatic; identified incidentally  . PONV (postoperative nausea and vomiting)   . Anxiety   . C. difficile colitis     a. 08/2012  . Myocardial infarct 09/08/12  . Nephrolithiasis   . Contrast dye induced nephropathy     a. 08/2012 post cath/pci  . CHF (congestive heart failure)   . Asthma   . Heart disease   . Old myocardial infarction   . Anginal pain   . GERD (gastroesophageal reflux disease)     Past Surgical History  Procedure Laterality Date  . Circumcision    . Stents    . Esophagogastroduodenoscopy      in [place] over 20 yrs ago  . Colonoscopy      In [facility], approximately 2011 per patient, was normal. Advised to come back in 10 years.  . Esophagogastroduodenoscopy  06/12/2012    [id number] esophagus-status post Venia Minks dilation. Abnormal gastric mucosa of uncertain significance-status post biopsy  . Cardiac catheterization    . Subxyphoid pericardial window N/A 06/23/2013    Procedure: SUBXYPHOID PERICARDIAL WINDOW;  Surgeon: Grace Isaac, MD;  Location: [place];  Service: Thoracic;  Laterality: N/A;  . Intraoperative transesophageal echocardiogram N/A 06/23/2013    Procedure: INTRAOPERATIVE TRANSESOPHAGEAL ECHOCARDIOGRAM;  Surgeon: Grace Isaac, MD;  Location: [place];  Service: Open Heart Surgery;  Laterality: N/A;  . Left heart catheterization with coronary angiogram N/A 09/08/2012    Procedure:  LEFT HEART CATHETERIZATION WITH CORONARY ANGIOGRAM;  Surgeon: [name], MD;  Location: [facility] CATH LAB;  Service: Cardiovascular;  Laterality: N/A;    Current Outpatient Prescriptions  Medication Sig Dispense Refill  . albuterol (PROVENTIL HFA;VENTOLIN HFA) 108 (90 BASE) MCG/ACT inhaler Inhale 2 puffs into the lungs every 4 (four) hours as needed for shortness of breath. 1 Inhaler 2  . ALPRAZolam (XANAX) 0.5 MG tablet Take 0.5 mg by mouth at bedtime as needed for sleep.    [name] aspirin EC 81 MG tablet Take  81 mg by mouth at bedtime.     . clopidogrel (PLAVIX) 75 MG tablet Take 1 tablet (75 mg total) by mouth at bedtime. 90 tablet 1  . fenofibrate 160 MG tablet Take 160 mg by mouth at bedtime.    . hydrALAZINE (APRESOLINE) 25 MG tablet Take 1.5 tablets (37.5 mg total) by mouth 3 (three) times daily. 405 tablet 1  . insulin lispro (HUMALOG KWIKPEN) 100 UNIT/ML KiwkPen 5 units are breakfast. 5 units at lunch. 7 units at supper 15 mL 3  . LANTUS SOLOSTAR 100 UNIT/ML Solostar Pen Inject 80 Units into the skin at bedtime.     [name] losartan (COZAAR) 100 MG tablet Take 100 mg by mouth daily.     . metoprolol tartrate (LOPRESSOR) 25 MG tablet Take 1 tablet (25 mg total) by mouth 2 (two) times daily. 60 tablet 3  . nitroGLYCERIN (NITROSTAT) 0.4 MG SL tablet Place 0.4 mg under the tongue every 5 (five) minutes as needed for chest pain.    [name] torsemide (DEMADEX) 20 MG tablet Take one tab (20mg ) every other day & take two tabs (40mg ) on alternating days    . esomeprazole (NEXIUM) 20 MG capsule Take 20 mg by mouth daily at 12 noon.     No current facility-administered medications for this visit.    Allergies as of 10/26/2014 - Review Complete 10/26/2014  Allergen Reaction Noted  . Hydrocodone Nausea And Vomiting 01/31/2012  . Lisinopril Cough 01/02/2013  . Neurontin [gabapentin] Other (See Comments) 01/02/2013  . Statins Other (See Comments) 12/25/2011  . Metformin and related Diarrhea and Other (See Comments) 08/12/2013  . Norvasc [amlodipine besylate] Swelling 08/12/2014    Family History  Problem Relation Age of Onset  . Diabetes Mother   . Heart attack Mother   . Stroke Mother   . Diabetes Sister   . Sleep apnea Sister   . Hypertension Brother   . Diabetes Brother   . Colon cancer Neg Hx   . Liver disease Neg Hx   . Diabetes Brother   . Hypertension Brother     History   Social History  . Marital Status: Married    Spouse Name: N/A  . Number of Children: 2  . Years of Education: N/A    Occupational History  . Shipping     [employer]   Social History Main Topics  . Smoking status: Never Smoker   . Smokeless tobacco: Never Used  . Alcohol Use: No     Comment: heavy etoh use 30 years ago  . Drug Use: No  . Sexual Activity: Yes    Birth Control/ Protection: None   Other Topics Concern  . 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 Disabled at this point.    Review of Systems: Gen: Denies fever, chills. Denies, weakness, unintended weight loss. Admits decreased appetite within the past 6 months as well. CV: Denies  chest pain, palpitations, syncope, worsening peripheral edema. Resp: Denies worsening dyspnea at rest, wheezing, coughing up blood. GI: See HPI. Denies vomiting blood, and fecal incontinence. Derm: Denies rash, itching, dry skin Psych: Denies depression, anxiety, memory loss, confusion. Heme: Denies bruising, bleeding, and enlarged lymph nodes.  Physical Exam: BP 193/100 mmHg  Pulse 85  Temp(Src) 97.9 F (36.6 C)  Ht 6\' 1"  (1.854 m)  Wt 276 lb 6.4 oz (125.374 kg)  BMI 36.47 kg/m2 General:   Alert and oriented. No distress noted. Pleasant and cooperative.  Head:  Normocephalic and atraumatic. Lungs:  Clear to auscultation bilaterally. No wheezes, rales, or rhonchi. No distress.  Heart:  S1, S2 present without murmurs, rubs, or gallops. Regular rate and rhythm. Abdomen:  Rounded, +BS, soft, and non-distended. Mild right side discomfort to palpation. Possible stool appreciated to palpation of left side abdomen. No rebound or guarding. No HSM or masses noted. Neurologic:  Alert and  oriented x4;  grossly normal neurologically. Skin:  Intact without significant lesions or rashes. Psych:  Alert and cooperative. Normal mood and affect.    10/26/2014 2:03 PM

## 2014-10-26 NOTE — Telephone Encounter (Signed)
May have this +4 refills 

## 2014-10-26 NOTE — Patient Instructions (Addendum)
1. Have your XRay taken in the next couple days 2. Have your lab drawn int eh next couple days 3. Add a daily fiber supplement (Benefiber, Metamucil, etc) 4. Return for follow-up in 4 weeks. 5. Start Linzess 290 mcg once daily on an empty stomach. Call in 2 weeks and let us know if it's helping. If so we can send in a prescription for you.

## 2014-10-27 ENCOUNTER — Telehealth: Payer: Self-pay | Admitting: *Deleted

## 2014-10-27 LAB — BASIC METABOLIC PANEL
BUN: 32 mg/dL — ABNORMAL HIGH (ref 6–23)
CALCIUM: 8.5 mg/dL (ref 8.4–10.5)
CO2: 26 mEq/L (ref 19–32)
CREATININE: 1.26 mg/dL (ref 0.50–1.35)
Chloride: 101 mEq/L (ref 96–112)
Glucose, Bld: 187 mg/dL — ABNORMAL HIGH (ref 70–99)
Potassium: 4.8 mEq/L (ref 3.5–5.3)
Sodium: 136 mEq/L (ref 135–145)

## 2014-10-27 LAB — TSH: TSH: 1.78 u[IU]/mL (ref 0.350–4.500)

## 2014-10-27 NOTE — Telephone Encounter (Signed)
-----   Message from Herminio Commons, MD sent at 10/27/2014  9:20 AM EST ----- Much improved.  ----- Message -----    From: Lab in [facility] Interface    Sent: 10/27/2014   1:41 AM      To: Herminio Commons, MD

## 2014-10-27 NOTE — Telephone Encounter (Signed)
Patient notified of lab results. All questions answered. Patient voiced understanding.

## 2014-10-28 DIAGNOSIS — R079 Chest pain, unspecified: Secondary | ICD-10-CM | POA: Diagnosis not present

## 2014-11-09 ENCOUNTER — Ambulatory Visit (INDEPENDENT_AMBULATORY_CARE_PROVIDER_SITE_OTHER): Payer: Commercial Managed Care - HMO | Admitting: Family Medicine

## 2014-11-09 ENCOUNTER — Encounter: Payer: Self-pay | Admitting: Family Medicine

## 2014-11-09 ENCOUNTER — Ambulatory Visit (HOSPITAL_COMMUNITY)
Admission: RE | Admit: 2014-11-09 | Discharge: 2014-11-09 | Disposition: A | Discharge status: Home / Self Care | Payer: Commercial Managed Care - HMO | Source: Ambulatory Visit | Attending: Family Medicine | Admitting: Family Medicine

## 2014-11-09 VITALS — BP 142/90 | Ht 73.0 in | Wt 277.0 lb

## 2014-11-09 DIAGNOSIS — Y939 Activity, unspecified: Secondary | ICD-10-CM | POA: Diagnosis not present

## 2014-11-09 DIAGNOSIS — M5032 Other cervical disc degeneration, mid-cervical region: Secondary | ICD-10-CM | POA: Diagnosis not present

## 2014-11-09 DIAGNOSIS — M542 Cervicalgia: Secondary | ICD-10-CM | POA: Diagnosis not present

## 2014-11-09 DIAGNOSIS — E118 Type 2 diabetes mellitus with unspecified complications: Secondary | ICD-10-CM | POA: Diagnosis not present

## 2014-11-09 DIAGNOSIS — W1830XA Fall on same level, unspecified, initial encounter: Secondary | ICD-10-CM | POA: Diagnosis not present

## 2014-11-09 DIAGNOSIS — Y92481 Parking lot as the place of occurrence of the external cause: Secondary | ICD-10-CM | POA: Diagnosis not present

## 2014-11-09 DIAGNOSIS — S199XXA Unspecified injury of neck, initial encounter: Secondary | ICD-10-CM | POA: Diagnosis not present

## 2014-11-09 NOTE — Progress Notes (Signed)
   Subjective:    Patient ID: Eugene [name], male    DOB: 06/10/1956, 59 y.o.   MRN: [id number]  HPI Patient is here today for a recheck on his breathing.  He said he feels so much better.  Pt gave up drinking milk.  Pt fell 2 weeks ago and hurt his neck. Patient states a been over he stood up quickly briefly fell out he isn't sure if he had a true loss of consciousness hit his neck on the way down  PMH heart disease diabetes  Review of Systems  Constitutional: Negative for activity change, appetite change and fatigue.  HENT: Negative for congestion.   Respiratory: Negative for cough.   Cardiovascular: Negative for chest pain.  Gastrointestinal: Negative for abdominal pain.  Endocrine: Negative for polydipsia and polyphagia.  Neurological: Negative for weakness.  Psychiatric/Behavioral: Negative for confusion.       Objective:   Physical Exam  Constitutional: He appears well-nourished. No distress.  Cardiovascular: Normal rate, regular rhythm and normal heart sounds.   No murmur heard. Pulmonary/Chest: Effort normal and breath sounds normal. No respiratory distress.  Musculoskeletal: He exhibits no edema.  Lymphadenopathy:    He has no cervical adenopathy.  Neurological: He is alert.  Psychiatric: His behavior is normal.  Vitals reviewed.         Assessment & Plan:  Cervical injury I don't find any obvious fracture or neurologic damage but we will go ahead and do C-spine x-rays  Diabetes I encouraged him to do sliding scale with mealtime insulin he will do 5 with breakfast 5 with lunch 7 with supper along with a sliding scale adjustment  He'll follow-up 4 weeks to show [place] how this is doing we will check an A1c at that time  Cardiac stable he does have pedal edema but his cardiologist adjusted his diuretics

## 2014-11-10 NOTE — Progress Notes (Signed)
Patient notified and verbalized understanding of the test results. No further questions. 

## 2014-11-11 ENCOUNTER — Emergency Department (HOSPITAL_COMMUNITY): Payer: Commercial Managed Care - HMO

## 2014-11-11 ENCOUNTER — Emergency Department (HOSPITAL_COMMUNITY)
Admission: EM | Admit: 2014-11-11 | Discharge: 2014-11-11 | Disposition: A | Discharge status: Home / Self Care | Payer: Commercial Managed Care - HMO | Attending: Emergency Medicine | Admitting: Emergency Medicine

## 2014-11-11 ENCOUNTER — Encounter (HOSPITAL_COMMUNITY): Payer: Self-pay | Admitting: *Deleted

## 2014-11-11 DIAGNOSIS — Z8669 Personal history of other diseases of the nervous system and sense organs: Secondary | ICD-10-CM | POA: Insufficient documentation

## 2014-11-11 DIAGNOSIS — Z79899 Other long term (current) drug therapy: Secondary | ICD-10-CM | POA: Insufficient documentation

## 2014-11-11 DIAGNOSIS — Z7902 Long term (current) use of antithrombotics/antiplatelets: Secondary | ICD-10-CM | POA: Diagnosis not present

## 2014-11-11 DIAGNOSIS — Z7982 Long term (current) use of aspirin: Secondary | ICD-10-CM | POA: Diagnosis not present

## 2014-11-11 DIAGNOSIS — I10 Essential (primary) hypertension: Secondary | ICD-10-CM | POA: Diagnosis not present

## 2014-11-11 DIAGNOSIS — E785 Hyperlipidemia, unspecified: Secondary | ICD-10-CM | POA: Diagnosis not present

## 2014-11-11 DIAGNOSIS — Z9889 Other specified postprocedural states: Secondary | ICD-10-CM | POA: Diagnosis not present

## 2014-11-11 DIAGNOSIS — I209 Angina pectoris, unspecified: Secondary | ICD-10-CM | POA: Diagnosis not present

## 2014-11-11 DIAGNOSIS — I509 Heart failure, unspecified: Secondary | ICD-10-CM | POA: Diagnosis not present

## 2014-11-11 DIAGNOSIS — Z87442 Personal history of urinary calculi: Secondary | ICD-10-CM | POA: Insufficient documentation

## 2014-11-11 DIAGNOSIS — M7989 Other specified soft tissue disorders: Secondary | ICD-10-CM | POA: Insufficient documentation

## 2014-11-11 DIAGNOSIS — Z8719 Personal history of other diseases of the digestive system: Secondary | ICD-10-CM | POA: Diagnosis not present

## 2014-11-11 DIAGNOSIS — I252 Old myocardial infarction: Secondary | ICD-10-CM | POA: Diagnosis not present

## 2014-11-11 DIAGNOSIS — J449 Chronic obstructive pulmonary disease, unspecified: Secondary | ICD-10-CM | POA: Diagnosis not present

## 2014-11-11 DIAGNOSIS — E119 Type 2 diabetes mellitus without complications: Secondary | ICD-10-CM | POA: Diagnosis not present

## 2014-11-11 DIAGNOSIS — Z794 Long term (current) use of insulin: Secondary | ICD-10-CM | POA: Insufficient documentation

## 2014-11-11 DIAGNOSIS — F419 Anxiety disorder, unspecified: Secondary | ICD-10-CM | POA: Insufficient documentation

## 2014-11-11 NOTE — ED Provider Notes (Signed)
CSN: [id number]     Arrival date & time 11/11/14  1424 History   First MD Initiated Contact with Patient 11/11/14 1624     Chief Complaint  Patient presents with  . Leg Swelling  . Hypertension     (Consider location/radiation/quality/duration/timing/severity/associated sxs/prior Treatment) HPI.... Left lower extremity swelling for several days. No chest pain or dyspnea. Severity is moderate. Nothing makes symptoms better or worse. No fever or chills.  Past Medical History  Diagnosis Date  . Arteriosclerotic cardiovascular disease (ASCVD)     a. 05/2011 Cath/PCI: LM nl, LAD 44m, D1 small, D2 small 71m, LCX large 40p, RCA 50-60p, 99 hazy @ origin of PDA with 70-80 in PDA (2.5x26 Resolute Integrity & 3.0x15 Resolute Integrity DES).;  b. 08/2012 Inflat  STEMI/Cath/PCI: LM minor irregs, LAD 50p, D1 50, LCX nl, OM1 25, RCA 30-40p, 100d (treated with 2.75x73mm Promus Premier DES);  c. 08/2012 Echo: EF 55-60%, basal inferopost HK.  [name] Hyperlipidemia   . Diabetes mellitus     Peripheral neuropathy  . Bell palsy   . Hypertension   . COPD (chronic obstructive pulmonary disease)   . Sleep apnea   . Gallstones   . Cholelithiasis 07/2012    Asymptomatic; identified incidentally  . PONV (postoperative nausea and vomiting)   . Anxiety   . C. difficile colitis     a. 08/2012  . Myocardial infarct 09/08/12  . Nephrolithiasis   . Contrast dye induced nephropathy     a. 08/2012 post cath/pci  . CHF (congestive heart failure)   . Asthma   . Heart disease   . Old myocardial infarction   . Anginal pain   . GERD (gastroesophageal reflux disease)    Past Surgical History  Procedure Laterality Date  . Circumcision    . Stents    . Esophagogastroduodenoscopy      in [place] over 20 yrs ago  . Colonoscopy      In [facility], approximately 2011 per patient, was normal. Advised to come back in 10 years.  . Esophagogastroduodenoscopy  06/12/2012    [id number] esophagus-status post  Venia Minks dilation. Abnormal gastric mucosa of uncertain significance-status post biopsy  . Cardiac catheterization    . Subxyphoid pericardial window N/A 06/23/2013    Procedure: SUBXYPHOID PERICARDIAL WINDOW;  Surgeon: Grace Isaac, MD;  Location: [place];  Service: Thoracic;  Laterality: N/A;  . Intraoperative transesophageal echocardiogram N/A 06/23/2013    Procedure: INTRAOPERATIVE TRANSESOPHAGEAL ECHOCARDIOGRAM;  Surgeon: Grace Isaac, MD;  Location: [place];  Service: Open Heart Surgery;  Laterality: N/A;  . Left heart catheterization with coronary angiogram N/A 09/08/2012    Procedure: LEFT HEART CATHETERIZATION WITH CORONARY ANGIOGRAM;  Surgeon: [name], MD;  Location: [facility] CATH LAB;  Service: Cardiovascular;  Laterality: N/A;   Family History  Problem Relation Age of Onset  . Diabetes Mother   . Heart attack Mother   . Stroke Mother   . Diabetes Sister   . Sleep apnea Sister   . Hypertension Brother   . Diabetes Brother   . Colon cancer Neg Hx   . Liver disease Neg Hx   . Diabetes Brother   . Hypertension Brother    History  Substance Use Topics  . Smoking status: Never Smoker   . Smokeless tobacco: Never Used  . Alcohol Use: No     Comment: heavy etoh use 30 years ago    Review of Systems  All other systems reviewed and are negative.  Allergies  Hydrocodone; Lisinopril; Neurontin; Statins; Metformin and related; and Norvasc  Home Medications   Prior to Admission medications   Medication Sig Start Date End Date Taking? Authorizing Provider  ALPRAZolam (XANAX) 0.5 MG tablet TAKE 1/2-1 TABLET BY MOUTH AT BEDTIME AS NEEDED Patient taking differently: TAKE 1/2-1 TABLET BY MOUTH AT BEDTIME AS NEEDED FOR SLEEP 10/27/14  Yes [name], MD  aspirin EC 81 MG tablet Take 81 mg by mouth at bedtime.    Yes Historical Provider, MD  clopidogrel (PLAVIX) 75 MG tablet Take 1 tablet (75 mg total) by mouth at bedtime. 02/19/14  Yes [name], MD  fenofibrate  160 MG tablet Take 160 mg by mouth at bedtime.   Yes Historical Provider, MD  hydrALAZINE (APRESOLINE) 25 MG tablet Take 1.5 tablets (37.5 mg total) by mouth 3 (three) times daily. 08/12/14  Yes [name], MD  insulin lispro (HUMALOG KWIKPEN) 100 UNIT/ML KiwkPen 5 units are breakfast. 5 units at lunch. 7 units at supper 08/12/14  Yes [name], MD  LANTUS SOLOSTAR 100 UNIT/ML Solostar Pen Inject 80 Units into the skin at bedtime.  05/27/14  Yes Historical Provider, MD  losartan (COZAAR) 100 MG tablet Take 100 mg by mouth daily.  06/09/14  Yes Historical Provider, MD  metoprolol tartrate (LOPRESSOR) 25 MG tablet Take 1 tablet (25 mg total) by mouth 2 (two) times daily. 01/29/14  Yes [name], MD  torsemide (DEMADEX) 20 MG tablet Take one tab (20mg ) every other day & take two tabs (40mg ) on alternating days 10/04/14  Yes Herminio Commons, MD  albuterol (PROVENTIL HFA;VENTOLIN HFA) 108 (90 BASE) MCG/ACT inhaler Inhale 2 puffs into the lungs every 4 (four) hours as needed for shortness of breath. 09/28/14   [name], MD  albuterol (PROVENTIL) (2.5 MG/3ML) 0.083% nebulizer solution Inhale 3 mLs into the lungs every 6 (six) hours as needed. 09/28/14   Historical Provider, MD  nitroGLYCERIN (NITROSTAT) 0.4 MG SL tablet Place 0.4 mg under the tongue every 5 (five) minutes as needed for chest pain.    Historical Provider, MD   BP 191/88 mmHg  Pulse 71  Temp(Src) 98.5 F (36.9 C) (Oral)  Resp 18  Ht 6\' 1"  (1.854 m)  Wt 277 lb (125.646 kg)  BMI 36.55 kg/m2  SpO2 94% Physical Exam  Constitutional: He is oriented to person, place, and time. He appears well-developed and well-nourished.  HENT:  Head: Normocephalic and atraumatic.  Eyes: Conjunctivae and EOM are normal. Pupils are equal, round, and reactive to light.  Neck: Normal range of motion. Neck supple.  Cardiovascular: Normal rate and regular rhythm.   Pulmonary/Chest: Effort normal and breath sounds normal.  Abdominal: Soft.  Bowel sounds are normal.  Musculoskeletal:  Left lower extremity appears puffy with minimal tenderness in the posterior calf and thigh  Neurological: He is alert and oriented to person, place, and time.  Skin: Skin is warm and dry.  Psychiatric: He has a normal mood and affect. His behavior is normal.  Nursing note and vitals reviewed.   ED Course  Procedures (including critical care time) Labs Review Labs Reviewed - No data to display  Imaging Review US Venous Img Lower Unilateral Left  11/11/2014   CLINICAL DATA:  Left leg swelling  EXAM: Left LOWER EXTREMITY VENOUS DOPPLER ULTRASOUND  TECHNIQUE: Gray-scale sonography with graded compression, as well as color Doppler and duplex ultrasound were performed to evaluate the lower extremity deep venous systems from the level  of the common femoral vein and including the common femoral, femoral, profunda femoral, popliteal and calf veins including the posterior tibial, peroneal and gastrocnemius veins when visible. The superficial great saphenous vein was also interrogated. Spectral Doppler was utilized to evaluate flow at rest and with distal augmentation maneuvers in the common femoral, femoral and popliteal veins.  COMPARISON:  None  FINDINGS: Contralateral Common Femoral Vein: Respiratory phasicity is normal and symmetric with the symptomatic side. No evidence of thrombus. Normal compressibility.  Common Femoral Vein: No evidence of thrombus. Normal compressibility, respiratory phasicity and response to augmentation.  Saphenofemoral Junction: No evidence of thrombus. Normal compressibility and flow on color Doppler imaging.  Profunda Femoral Vein: No evidence of thrombus. Normal compressibility and flow on color Doppler imaging.  Femoral Vein: No evidence of thrombus. Normal compressibility, respiratory phasicity and response to augmentation.  Popliteal Vein: No evidence of thrombus. Normal compressibility, respiratory phasicity and response to  augmentation.  Calf Veins: No evidence of thrombus. Normal compressibility and flow on color Doppler imaging.  Superficial Great Saphenous Vein: No evidence of thrombus. Normal compressibility and flow on color Doppler imaging.  Venous Reflux:  None.  Other Findings:  Subcutaneous edema is noted within the calf.  IMPRESSION: No evidence of deep venous thrombosis.   Electronically Signed   By: Inez Catalina M.D.   On: 11/11/2014 17:29     EKG Interpretation None      MDM   Final diagnoses:  Left leg swelling    Doppler study of left lower extremity reveals no DVT. Elevate, compression hose, decrease salt.    Nat Christen, MD 11/11/14 [phone]

## 2014-11-11 NOTE — ED Notes (Signed)
Pt could not get in to see his doctor. Pt states legs swell daily but he woke up this morning with his LLL extremely swollen. Denies SOB at this time.

## 2014-11-11 NOTE — Discharge Instructions (Signed)
Tests showed no blood clot in your leg. Elevate legs. Decrease salt. Recommend support hose.

## 2014-11-12 ENCOUNTER — Encounter: Payer: Self-pay | Admitting: *Deleted

## 2014-11-12 ENCOUNTER — Encounter: Payer: Medicare HMO | Admitting: Adult Health

## 2014-11-12 NOTE — Progress Notes (Signed)
    Cardiology Office Note  Error NO Show   

## 2014-11-30 ENCOUNTER — Ambulatory Visit: Payer: Medicare HMO | Admitting: Nurse Practitioner

## 2014-12-08 ENCOUNTER — Encounter: Payer: Self-pay | Admitting: Family Medicine

## 2014-12-08 ENCOUNTER — Ambulatory Visit (INDEPENDENT_AMBULATORY_CARE_PROVIDER_SITE_OTHER): Payer: Commercial Managed Care - HMO | Admitting: Family Medicine

## 2014-12-08 VITALS — BP 132/76 | Ht 73.0 in | Wt 273.0 lb

## 2014-12-08 DIAGNOSIS — Z79899 Other long term (current) drug therapy: Secondary | ICD-10-CM

## 2014-12-08 DIAGNOSIS — I10 Essential (primary) hypertension: Secondary | ICD-10-CM | POA: Diagnosis not present

## 2014-12-08 DIAGNOSIS — R609 Edema, unspecified: Secondary | ICD-10-CM

## 2014-12-08 DIAGNOSIS — E119 Type 2 diabetes mellitus without complications: Secondary | ICD-10-CM | POA: Diagnosis not present

## 2014-12-08 DIAGNOSIS — N189 Chronic kidney disease, unspecified: Secondary | ICD-10-CM

## 2014-12-08 DIAGNOSIS — N183 Chronic kidney disease, stage 3 unspecified: Secondary | ICD-10-CM

## 2014-12-08 DIAGNOSIS — E785 Hyperlipidemia, unspecified: Secondary | ICD-10-CM | POA: Diagnosis not present

## 2014-12-08 LAB — POCT GLYCOSYLATED HEMOGLOBIN (HGB A1C): Hemoglobin A1C: 8.6

## 2014-12-08 MED ORDER — TORSEMIDE 20 MG PO TABS: ORAL_TABLET | ORAL | Status: DC

## 2014-12-08 MED ORDER — FENOFIBRATE 160 MG PO TABS
160.0000 mg | ORAL_TABLET | Freq: Every day | ORAL | Status: DC
Start: 2014-12-08 — End: 2016-04-07

## 2014-12-08 MED ORDER — FENOFIBRATE 160 MG PO TABS: 160.0000 mg | ORAL_TABLET | Freq: Every day | ORAL | Status: DC

## 2014-12-08 NOTE — Progress Notes (Signed)
   Subjective:    Patient ID: Eugene [name], male    DOB: [birth year], 59 y.o.   MRN: [id number]  Diabetes He presents for his follow-up diabetic visit. He has type 2 diabetes mellitus. Pertinent negatives for hypoglycemia include no confusion. Pertinent negatives for diabetes include no chest pain, no fatigue, no polydipsia, no polyphagia and no weakness. Symptoms are stable. Current diabetic treatment includes insulin injections. He is compliant with treatment all of the time. His highest blood glucose is 140-180 mg/dl.   Patient having chronic swelling in his legs he states is worse midmorning and later throughout the day he does wear support hose surgical. In addition to this he denies any chest tightness pressure pain PND. He relates compliance with his medicines. He does try to minimize the amount of salt he takes then. He does state he is compliant with his cholesterol medicine and tries to be careful with the diet as well. He denies any acute cardiac symptoms currently   Review of Systems  Constitutional: Negative for activity change, appetite change and fatigue.  HENT: Negative for congestion.   Respiratory: Negative for cough.   Cardiovascular: Negative for chest pain.  Gastrointestinal: Negative for abdominal pain.  Endocrine: Negative for polydipsia and polyphagia.  Genitourinary: Negative for frequency.  Neurological: Negative for weakness.  Psychiatric/Behavioral: Negative for confusion.       Objective:   Physical Exam  Constitutional: He appears well-nourished. No distress.  Cardiovascular: Normal rate, regular rhythm and normal heart sounds.   No murmur heard. Pulmonary/Chest: Effort normal and breath sounds normal. No respiratory distress.  Musculoskeletal: He exhibits no edema.  Lymphadenopathy:    He has no cervical adenopathy.  Neurological: He is alert.  Psychiatric: His behavior is normal.  Vitals reviewed.         Assessment & Plan:  1. Type 2  diabetes mellitus without complication His diabetes under better control than what it was but still not quite where we want to be we discussed a little bit better with diet as well as physical activity he just recently improved his diet so therefore his A1c LOOKED better the next time he follows up in 3-4 months he denies any low sugar spells. - POCT glycosylated hemoglobin (Hb A1C) - Microalbumin, urine  2. Essential hypertension Blood pressure under much better control check metabolic 7 - Basic metabolic panel  3. Chronic renal insufficiency, stage III (moderate) Patient does have a history of renal insufficiency I encouraged him to minimize protein in the diet  4. Peripheral edema We will go ahead and increase his diuretic take 2 in the morning one in nearly afternoon repeat metabolic 7 in approximately 3-4 weeks he denies any CHF symptoms  5. Edema C #4  6. High risk medication use Because of his medications it is important check liver function - Hepatic function panel  7. Hyperlipemia Continue medication try to get LDL below 70 - Lipid panel

## 2014-12-08 NOTE — Patient Instructions (Signed)

## 2014-12-22 ENCOUNTER — Ambulatory Visit: Payer: Commercial Managed Care - HMO | Admitting: Cardiovascular Disease

## 2014-12-24 ENCOUNTER — Other Ambulatory Visit: Payer: Self-pay | Admitting: Family Medicine

## 2014-12-25 ENCOUNTER — Emergency Department (HOSPITAL_COMMUNITY): Payer: Commercial Managed Care - HMO

## 2014-12-25 ENCOUNTER — Encounter (HOSPITAL_COMMUNITY): Payer: Self-pay | Admitting: Emergency Medicine

## 2014-12-25 ENCOUNTER — Inpatient Hospital Stay (HOSPITAL_COMMUNITY)
Admission: EM | Admit: 2014-12-25 | Discharge: 2014-12-27 | DRG: 292 | Disposition: A | Discharge status: Home / Self Care | Payer: Commercial Managed Care - HMO | Attending: Internal Medicine | Admitting: Internal Medicine

## 2014-12-25 DIAGNOSIS — Z8249 Family history of ischemic heart disease and other diseases of the circulatory system: Secondary | ICD-10-CM | POA: Diagnosis not present

## 2014-12-25 DIAGNOSIS — T502X5A Adverse effect of carbonic-anhydrase inhibitors, benzothiadiazides and other diuretics, initial encounter: Secondary | ICD-10-CM | POA: Diagnosis present

## 2014-12-25 DIAGNOSIS — J45909 Unspecified asthma, uncomplicated: Secondary | ICD-10-CM | POA: Diagnosis not present

## 2014-12-25 DIAGNOSIS — I209 Angina pectoris, unspecified: Secondary | ICD-10-CM | POA: Diagnosis not present

## 2014-12-25 DIAGNOSIS — N189 Chronic kidney disease, unspecified: Secondary | ICD-10-CM | POA: Diagnosis not present

## 2014-12-25 DIAGNOSIS — Z794 Long term (current) use of insulin: Secondary | ICD-10-CM

## 2014-12-25 DIAGNOSIS — I5032 Chronic diastolic (congestive) heart failure: Secondary | ICD-10-CM | POA: Diagnosis not present

## 2014-12-25 DIAGNOSIS — I129 Hypertensive chronic kidney disease with stage 1 through stage 4 chronic kidney disease, or unspecified chronic kidney disease: Secondary | ICD-10-CM | POA: Diagnosis not present

## 2014-12-25 DIAGNOSIS — N179 Acute kidney failure, unspecified: Secondary | ICD-10-CM | POA: Diagnosis not present

## 2014-12-25 DIAGNOSIS — Z823 Family history of stroke: Secondary | ICD-10-CM | POA: Diagnosis not present

## 2014-12-25 DIAGNOSIS — I10 Essential (primary) hypertension: Secondary | ICD-10-CM

## 2014-12-25 DIAGNOSIS — Z9861 Coronary angioplasty status: Secondary | ICD-10-CM

## 2014-12-25 DIAGNOSIS — E785 Hyperlipidemia, unspecified: Secondary | ICD-10-CM | POA: Diagnosis not present

## 2014-12-25 DIAGNOSIS — R079 Chest pain, unspecified: Secondary | ICD-10-CM | POA: Diagnosis not present

## 2014-12-25 DIAGNOSIS — I252 Old myocardial infarction: Secondary | ICD-10-CM | POA: Diagnosis not present

## 2014-12-25 DIAGNOSIS — R0600 Dyspnea, unspecified: Secondary | ICD-10-CM

## 2014-12-25 DIAGNOSIS — I313 Pericardial effusion (noninflammatory): Secondary | ICD-10-CM | POA: Diagnosis present

## 2014-12-25 DIAGNOSIS — R0602 Shortness of breath: Secondary | ICD-10-CM | POA: Diagnosis not present

## 2014-12-25 DIAGNOSIS — R0609 Other forms of dyspnea: Secondary | ICD-10-CM | POA: Diagnosis not present

## 2014-12-25 DIAGNOSIS — E119 Type 2 diabetes mellitus without complications: Secondary | ICD-10-CM

## 2014-12-25 DIAGNOSIS — N183 Chronic kidney disease, stage 3 (moderate): Secondary | ICD-10-CM | POA: Diagnosis present

## 2014-12-25 DIAGNOSIS — R0789 Other chest pain: Secondary | ICD-10-CM | POA: Diagnosis present

## 2014-12-25 DIAGNOSIS — E1140 Type 2 diabetes mellitus with diabetic neuropathy, unspecified: Secondary | ICD-10-CM | POA: Diagnosis present

## 2014-12-25 DIAGNOSIS — I5033 Acute on chronic diastolic (congestive) heart failure: Secondary | ICD-10-CM | POA: Diagnosis not present

## 2014-12-25 DIAGNOSIS — J449 Chronic obstructive pulmonary disease, unspecified: Secondary | ICD-10-CM | POA: Diagnosis not present

## 2014-12-25 DIAGNOSIS — Z7902 Long term (current) use of antithrombotics/antiplatelets: Secondary | ICD-10-CM

## 2014-12-25 DIAGNOSIS — Z7982 Long term (current) use of aspirin: Secondary | ICD-10-CM

## 2014-12-25 DIAGNOSIS — Z833 Family history of diabetes mellitus: Secondary | ICD-10-CM | POA: Diagnosis not present

## 2014-12-25 DIAGNOSIS — E782 Mixed hyperlipidemia: Secondary | ICD-10-CM | POA: Diagnosis present

## 2014-12-25 DIAGNOSIS — G4730 Sleep apnea, unspecified: Secondary | ICD-10-CM | POA: Diagnosis not present

## 2014-12-25 DIAGNOSIS — I5031 Acute diastolic (congestive) heart failure: Secondary | ICD-10-CM | POA: Diagnosis not present

## 2014-12-25 DIAGNOSIS — M7989 Other specified soft tissue disorders: Secondary | ICD-10-CM | POA: Diagnosis not present

## 2014-12-25 LAB — CBC
HCT: 36.6 % — ABNORMAL LOW (ref 39.0–52.0)
HCT: 33.5 % — ABNORMAL LOW (ref 39.0–52.0)
HEMOGLOBIN: 12.2 g/dL — AB (ref 13.0–17.0)
Hemoglobin: 11.2 g/dL — ABNORMAL LOW (ref 13.0–17.0)
MCH: 29 pg (ref 26.0–34.0)
MCH: 28.4 pg (ref 26.0–34.0)
MCHC: 33.3 g/dL (ref 30.0–36.0)
MCHC: 33.4 g/dL (ref 30.0–36.0)
MCV: 86.9 fL (ref 78.0–100.0)
MCV: 85 fL (ref 78.0–100.0)
PLATELETS: 209 10*3/uL (ref 150–400)
Platelets: 192 10*3/uL (ref 150–400)
RBC: 4.21 MIL/uL — ABNORMAL LOW (ref 4.22–5.81)
RBC: 3.94 MIL/uL — AB (ref 4.22–5.81)
RDW: 13.5 % (ref 11.5–15.5)
RDW: 13.5 % (ref 11.5–15.5)
WBC: 6.5 10*3/uL (ref 4.0–10.5)
WBC: 7 10*3/uL (ref 4.0–10.5)

## 2014-12-25 LAB — GLUCOSE, CAPILLARY: Glucose-Capillary: 167 mg/dL — ABNORMAL HIGH (ref 70–99)

## 2014-12-25 LAB — CREATININE, SERUM
CREATININE: 1.6 mg/dL — AB (ref 0.50–1.35)
GFR calc Af Amer: 54 mL/min — ABNORMAL LOW (ref >90)
GFR calc non Af Amer: 46 mL/min — ABNORMAL LOW (ref >90)

## 2014-12-25 LAB — TROPONIN I
Troponin I: 0.03 ng/mL (ref <0.031)
Troponin I: 0.03 ng/mL (ref <0.031)

## 2014-12-25 LAB — BASIC METABOLIC PANEL
Anion gap: 8 (ref 5–15)
BUN: 27 mg/dL — ABNORMAL HIGH (ref 6–23)
CO2: 26 mmol/L (ref 19–32)
Calcium: 9.1 mg/dL (ref 8.4–10.5)
Chloride: 101 mmol/L (ref 96–112)
Creatinine, Ser: 1.38 mg/dL — ABNORMAL HIGH (ref 0.50–1.35)
GFR calc Af Amer: 64 mL/min — ABNORMAL LOW (ref >90)
GFR calc non Af Amer: 55 mL/min — ABNORMAL LOW (ref >90)
Glucose, Bld: 264 mg/dL — ABNORMAL HIGH (ref 70–99)
Potassium: 4.8 mmol/L (ref 3.5–5.1)
SODIUM: 135 mmol/L (ref 135–145)

## 2014-12-25 LAB — BRAIN NATRIURETIC PEPTIDE: B Natriuretic Peptide: 72 pg/mL (ref 0.0–100.0)

## 2014-12-25 MED ORDER — INSULIN ASPART 100 UNIT/ML ~~LOC~~ SOLN
3.0000 [IU] | Freq: Three times a day (TID) | SUBCUTANEOUS | Status: DC
  Administered 2014-12-26 – 2014-12-27 (×4): 3 [IU] via SUBCUTANEOUS

## 2014-12-25 MED ORDER — ALBUTEROL SULFATE HFA 108 (90 BASE) MCG/ACT IN AERS: 2.0000 | INHALATION_SPRAY | RESPIRATORY_TRACT | Status: DC | PRN

## 2014-12-25 MED ORDER — ASPIRIN 81 MG PO CHEW
324.0000 mg | CHEWABLE_TABLET | Freq: Once | ORAL | Status: AC
  Administered 2014-12-25: 324 mg via ORAL
  Filled 2014-12-25: qty 4

## 2014-12-25 MED ORDER — ROSUVASTATIN CALCIUM 10 MG PO TABS
20.0000 mg | ORAL_TABLET | Freq: Every day | ORAL | Status: DC
  Administered 2014-12-26: 20 mg via ORAL
  Filled 2014-12-25: qty 2

## 2014-12-25 MED ORDER — INSULIN ASPART 100 UNIT/ML ~~LOC~~ SOLN
0.0000 [IU] | Freq: Three times a day (TID) | SUBCUTANEOUS | Status: DC
  Administered 2014-12-26 (×2): 2 [IU] via SUBCUTANEOUS
  Administered 2014-12-26: 1 [IU] via SUBCUTANEOUS
  Administered 2014-12-27 (×2): 2 [IU] via SUBCUTANEOUS

## 2014-12-25 MED ORDER — ASPIRIN EC 325 MG PO TBEC
325.0000 mg | DELAYED_RELEASE_TABLET | Freq: Every day | ORAL | Status: DC
Start: 2014-12-26 — End: 2014-12-27
  Administered 2014-12-26 – 2014-12-27 (×2): 325 mg via ORAL
  Filled 2014-12-25 (×2): qty 1

## 2014-12-25 MED ORDER — INSULIN GLARGINE 100 UNIT/ML SOLOSTAR PEN: 40.0000 [IU] | PEN_INJECTOR | Freq: Two times a day (BID) | SUBCUTANEOUS | Status: DC

## 2014-12-25 MED ORDER — HYDRALAZINE HCL 25 MG PO TABS
37.5000 mg | ORAL_TABLET | Freq: Three times a day (TID) | ORAL | Status: DC
  Administered 2014-12-25 – 2014-12-26 (×2): 37.5 mg via ORAL
  Filled 2014-12-25 (×2): qty 2

## 2014-12-25 MED ORDER — MORPHINE SULFATE 2 MG/ML IJ SOLN: 2.0000 mg | INTRAMUSCULAR | Status: DC | PRN

## 2014-12-25 MED ORDER — ACETAMINOPHEN 325 MG PO TABS: 650.0000 mg | ORAL_TABLET | ORAL | Status: DC | PRN

## 2014-12-25 MED ORDER — ASPIRIN EC 81 MG PO TBEC: 81.0000 mg | DELAYED_RELEASE_TABLET | Freq: Every day | ORAL | Status: DC

## 2014-12-25 MED ORDER — METOPROLOL TARTRATE 25 MG PO TABS
25.0000 mg | ORAL_TABLET | Freq: Two times a day (BID) | ORAL | Status: DC
  Administered 2014-12-25 – 2014-12-27 (×4): 25 mg via ORAL
  Filled 2014-12-25 (×4): qty 1

## 2014-12-25 MED ORDER — ALBUTEROL SULFATE (2.5 MG/3ML) 0.083% IN NEBU: 3.0000 mL | INHALATION_SOLUTION | Freq: Four times a day (QID) | RESPIRATORY_TRACT | Status: DC | PRN

## 2014-12-25 MED ORDER — FUROSEMIDE 10 MG/ML IJ SOLN
40.0000 mg | Freq: Once | INTRAMUSCULAR | Status: AC
  Administered 2014-12-25: 40 mg via INTRAVENOUS
  Filled 2014-12-25: qty 4

## 2014-12-25 MED ORDER — HEPARIN SODIUM (PORCINE) 5000 UNIT/ML IJ SOLN
5000.0000 [IU] | Freq: Three times a day (TID) | INTRAMUSCULAR | Status: DC
  Administered 2014-12-25 – 2014-12-27 (×5): 5000 [IU] via SUBCUTANEOUS
  Filled 2014-12-25 (×3): qty 1

## 2014-12-25 MED ORDER — CLOPIDOGREL BISULFATE 75 MG PO TABS: 75.0000 mg | ORAL_TABLET | Freq: Every day | ORAL | Status: DC

## 2014-12-25 MED ORDER — ALPRAZOLAM 0.25 MG PO TABS: 0.2500 mg | ORAL_TABLET | Freq: Three times a day (TID) | ORAL | Status: DC | PRN

## 2014-12-25 MED ORDER — NITROGLYCERIN IN D5W 200-5 MCG/ML-% IV SOLN
0.0000 ug/min | INTRAVENOUS | Status: DC
  Administered 2014-12-25: 5 ug/min via INTRAVENOUS
  Filled 2014-12-25: qty 250

## 2014-12-25 MED ORDER — FENOFIBRATE 160 MG PO TABS
160.0000 mg | ORAL_TABLET | Freq: Every day | ORAL | Status: DC
  Administered 2014-12-25 – 2014-12-26 (×2): 160 mg via ORAL
  Filled 2014-12-25 (×2): qty 1

## 2014-12-25 MED ORDER — TORSEMIDE 20 MG PO TABS
40.0000 mg | ORAL_TABLET | Freq: Two times a day (BID) | ORAL | Status: DC
  Administered 2014-12-26: 40 mg via ORAL
  Filled 2014-12-25: qty 2

## 2014-12-25 MED ORDER — NITROGLYCERIN 0.4 MG SL SUBL: 0.4000 mg | SUBLINGUAL_TABLET | SUBLINGUAL | Status: DC | PRN

## 2014-12-25 MED ORDER — INSULIN GLARGINE 100 UNIT/ML ~~LOC~~ SOLN
40.0000 [IU] | Freq: Two times a day (BID) | SUBCUTANEOUS | Status: DC
  Administered 2014-12-25: 40 [IU] via SUBCUTANEOUS
  Filled 2014-12-25 (×4): qty 0.4

## 2014-12-25 MED ORDER — ONDANSETRON HCL 4 MG/2ML IJ SOLN: 4.0000 mg | Freq: Four times a day (QID) | INTRAMUSCULAR | Status: DC | PRN

## 2014-12-25 MED ORDER — INSULIN ASPART 100 UNIT/ML ~~LOC~~ SOLN
0.0000 [IU] | Freq: Every day | SUBCUTANEOUS | Status: DC
  Administered 2014-12-26: 3 [IU] via SUBCUTANEOUS

## 2014-12-25 MED ORDER — LOSARTAN POTASSIUM 50 MG PO TABS
100.0000 mg | ORAL_TABLET | Freq: Every day | ORAL | Status: DC
  Administered 2014-12-26 – 2014-12-27 (×2): 100 mg via ORAL
  Filled 2014-12-25 (×2): qty 2

## 2014-12-25 NOTE — H&P (Signed)
[facility] History and Physical  Eugene [name] [id number] DOB: [birth year] DOA: 12/25/2014  Referring physician: Dr. Kathrynn [name] PCP: Eugene Lange, MD   Chief Complaint: SOB with excertion  HPI: Eugene [name] is a 59 y.o. male with past medical history of coronary artery disease with a prior STEMI 2014 status post PCI to the RCA, diabetes mellitus uncontrolled with a last hemoglobin A1c of 10, essential hypertension and COPD comes to the emergency room for shortness of breath on neck surgeon. He relates that on a good day he works out at the [employer] he walks and swimming  and he doesn't get short of breath now he's been having shortness of breath intermittently for the past 3 days and he cannot even walk 50 feet without being short of breath. He relates his legs have been swollen more than usual. He denies any PND. He relates he saw his cardiologist about a month ago increase his Lasix dose Lasix continue to be swollen.  In the ED: He came in with a blood pressure of 226/109 he was given aspirin Plavix and Lasix 40 mg IV and his blood pressure improve to 159/74, his first set of cardiac enzymes were negative his EKG showed an old inferior infarct with sinus rhythm and nonspecific T-wave abnormalities. Chest x-ray shows no infiltrates his blood glucose is 200 and BNP is 72. So we're consulted for further evaluation.  Review of Systems:  Constitutional:  No weight loss, night sweats, Fevers, chills, fatigue.  HEENT:  No headaches, Difficulty swallowing,Tooth/dental problems,Sore throat,  No sneezing, itching, ear ache, nasal congestion, post nasal drip,  Cardio-vascular:  No chest pain, Orthopnea, PND, swelling in lower extremities, anasarca, dizziness, palpitations  GI:  No heartburn, indigestion, abdominal pain, nausea, vomiting, diarrhea, change in bowel habits, loss of appetite  Resp:  No excess mucus, no productive cough, No non-productive cough, No coughing up of  blood.No change in color of mucus.No wheezing.No chest wall deformity  Skin:  no rash or lesions.  GU:  no dysuria, change in color of urine, no urgency or frequency. No flank pain.  Musculoskeletal:  No joint pain or swelling. No decreased range of motion. No back pain.  Psych:  No change in mood or affect. No depression or anxiety. No memory loss.   Past Medical History  Diagnosis Date  . Arteriosclerotic cardiovascular disease (ASCVD)     a. 05/2011 Cath/PCI: LM nl, LAD 70m, D1 small, D2 small 37m, LCX large 40p, RCA 50-60p, 99 hazy @ origin of PDA with 70-80 in PDA (2.5x26 Resolute Integrity & 3.0x15 Resolute Integrity DES).;  b. 08/2012 Inflat  STEMI/Cath/PCI: LM minor irregs, LAD 50p, D1 50, LCX nl, OM1 25, RCA 30-40p, 100d (treated with 2.75x15mm Promus Premier DES);  c. 08/2012 Echo: EF 55-60%, basal inferopost HK.  [name] Hyperlipidemia   . Diabetes mellitus     Peripheral neuropathy  . Bell palsy   . Hypertension   . COPD (chronic obstructive pulmonary disease)   . Sleep apnea   . Gallstones   . Cholelithiasis 07/2012    Asymptomatic; identified incidentally  . PONV (postoperative nausea and vomiting)   . Anxiety   . C. difficile colitis     a. 08/2012  . Myocardial infarct 09/08/12  . Nephrolithiasis   . Contrast dye induced nephropathy     a. 08/2012 post cath/pci  . CHF (congestive heart failure)   . Asthma   . Heart disease   . Old myocardial infarction   .  Anginal pain   . GERD (gastroesophageal reflux disease)    Past Surgical History  Procedure Laterality Date  . Circumcision    . Stents    . Esophagogastroduodenoscopy      in [place] over 20 yrs ago  . Colonoscopy      In [facility], approximately 2011 per patient, was normal. Advised to come back in 10 years.  . Esophagogastroduodenoscopy  06/12/2012    [id number] esophagus-status post Venia Minks dilation. Abnormal gastric mucosa of uncertain significance-status post biopsy  . Cardiac  catheterization    . Subxyphoid pericardial window N/A 06/23/2013    Procedure: SUBXYPHOID PERICARDIAL WINDOW;  Surgeon: Eugene Isaac, MD;  Location: [place];  Service: Thoracic;  Laterality: N/A;  . Intraoperative transesophageal echocardiogram N/A 06/23/2013    Procedure: INTRAOPERATIVE TRANSESOPHAGEAL ECHOCARDIOGRAM;  Surgeon: Eugene Isaac, MD;  Location: [place];  Service: Open Heart Surgery;  Laterality: N/A;  . Left heart catheterization with coronary angiogram N/A 09/08/2012    Procedure: LEFT HEART CATHETERIZATION WITH CORONARY ANGIOGRAM;  Surgeon: [name], MD;  Location: [facility] CATH LAB;  Service: Cardiovascular;  Laterality: N/A;  . Chest tube insertion     Social History:  reports that he has never smoked. He has never used smokeless tobacco. He reports that he does not drink alcohol or use illicit drugs.  Allergies  Allergen Reactions  . Hydrocodone Nausea And Vomiting  . Lisinopril Cough  . Neurontin [Gabapentin] Other (See Comments)    Suicidal thoughts  . Statins Other (See Comments)    Muscle aches  . Metformin And Related Diarrhea and Other (See Comments)    Intestinal side effects  . Norvasc [Amlodipine Besylate] Swelling    Pedal edema    Family History  Problem Relation Age of Onset  . Diabetes Mother   . Heart attack Mother   . Stroke Mother   . Diabetes Sister   . Sleep apnea Sister   . Hypertension Brother   . Diabetes Brother   . Colon cancer Neg Hx   . Liver disease Neg Hx   . Diabetes Brother   . Hypertension Brother      Prior to Admission medications   Medication Sig Start Date End Date Taking? Authorizing Provider  albuterol (PROVENTIL HFA;VENTOLIN HFA) 108 (90 BASE) MCG/ACT inhaler Inhale 2 puffs into the lungs every 4 (four) hours as needed for shortness of breath. 09/28/14  Yes [name], MD  albuterol (PROVENTIL) (2.5 MG/3ML) 0.083% nebulizer solution Inhale 3 mLs into the lungs every 6 (six) hours as needed. 09/28/14  Yes  Historical Provider, MD  ALPRAZolam (XANAX) 0.5 MG tablet TAKE 1/2-1 TABLET BY MOUTH AT BEDTIME AS NEEDED Patient taking differently: TAKE 1/2-1 TABLET BY MOUTH AT BEDTIME AS NEEDED FOR SLEEP 10/27/14  Yes [name], MD  aspirin EC 81 MG tablet Take 81 mg by mouth at bedtime.    Yes Historical Provider, MD  clopidogrel (PLAVIX) 75 MG tablet Take 1 tablet (75 mg total) by mouth at bedtime. 02/19/14  Yes [name], MD  fenofibrate 160 MG tablet Take 1 tablet (160 mg total) by mouth at bedtime. 12/08/14  Yes [name], MD  hydrALAZINE (APRESOLINE) 25 MG tablet Take 1.5 tablets (37.5 mg total) by mouth 3 (three) times daily. 08/12/14  Yes [name], MD  insulin lispro (HUMALOG KWIKPEN) 100 UNIT/ML KiwkPen 5 units are breakfast. 5 units at lunch. 7 units at supper 08/12/14  Yes [name], MD  LANTUS SOLOSTAR 100 UNIT/ML Solostar Pen Inject 80 Units into the skin at bedtime.  05/27/14  Yes Historical Provider, MD  losartan (COZAAR) 100 MG tablet Take 100 mg by mouth daily.  06/09/14  Yes Historical Provider, MD  metoprolol tartrate (LOPRESSOR) 25 MG tablet Take 1 tablet (25 mg total) by mouth 2 (two) times daily. 01/29/14  Yes [name], MD  nitroGLYCERIN (NITROSTAT) 0.4 MG SL tablet Place 0.4 mg under the tongue every 5 (five) minutes as needed for chest pain.   Yes Historical Provider, MD  torsemide (DEMADEX) 20 MG tablet Take 2 tablets in the morning and 1 tablet in the afternoon. 12/08/14  Yes [name], MD  glucose blood (EASY TOUCH TEST) test strip Diabetes type 2. Dx code E11.9 12/24/14   [name], MD   Physical Exam: Filed Vitals:   12/25/14 1259 12/25/14 1330 12/25/14 1400 12/25/14 1456  BP: 226/109 178/78 159/74 197/92  Pulse: 82 66 62 71  Temp: 98 F (36.7 C)     TempSrc: Oral     Resp: 18 16 15 16   Height: 6\' 1"  (1.854 m)     Weight: 123.832 kg (273 lb)     SpO2: 98% 93% 96% 100%    Wt Readings from Last 3 Encounters:  12/25/14 123.832 kg (273 lb)    12/08/14 123.832 kg (273 lb)  11/11/14 125.646 kg (277 lb)    General:  Appears calm and comfortable Eyes: PERRL, normal lids, irises & conjunctiva ENT: grossly normal hearing, lips & tongue Neck: no LAD, masses or thyromegaly Cardiovascular: RRR, no m/r/g. 2+ edema Telemetry: SR, no arrhythmias  Respiratory: Good air movement and clear to auscultation. Abdomen: soft, ntnd Skin: no rash or induration seen on limited exam Musculoskeletal: grossly normal tone BUE/BLE Psychiatric: grossly normal mood and affect, speech fluent and appropriate Neurologic: grossly non-focal.          Labs on Admission:  Basic Metabolic Panel:  Recent Labs Lab 12/25/14 1305  NA 135  K 4.8  CL 101  CO2 26  GLUCOSE 264*  BUN 27*  CREATININE 1.38*  CALCIUM 9.1   Liver Function Tests: No results for input(s): AST, ALT, ALKPHOS, BILITOT, PROT, ALBUMIN in the last 168 hours. No results for input(s): LIPASE, AMYLASE in the last 168 hours. No results for input(s): AMMONIA in the last 168 hours. CBC:  Recent Labs Lab 12/25/14 1305  WBC 6.5  HGB 12.2*  HCT 36.6*  MCV 86.9  PLT 209   Cardiac Enzymes:  Recent Labs Lab 12/25/14 1305  TROPONINI 0.03    BNP (last 3 results)  Recent Labs  08/28/14 2015 12/25/14 1305  BNP 62.0 72.0    ProBNP (last 3 results)  Recent Labs  01/25/14 2143 07/03/14 1636  PROBNP 678.1* 351.9*    CBG: No results for input(s): GLUCAP in the last 168 hours.  Radiological Exams on Admission: Dg Chest Port 1 View  12/25/2014   CLINICAL DATA:  Chest pain, shortness of breath and lightheadedness for 1 hour. History of COPD, hypertension, diabetes, CHF, MI.  EXAM: PORTABLE CHEST - 1 VIEW  COMPARISON:  08/28/2014  FINDINGS: The heart is enlarged. Lungs are clear. No pulmonary edema. Mid thoracic spondylosis noted.  IMPRESSION: Cardiomegaly.   Electronically Signed   By: Nolon Nations M.D.   On: 12/25/2014 14:12    EKG: Independently reviewed. Sinus  rhythm  Assessment/Plan SOB (shortness of breath): - He relates intermittent shortness of breath and some mild atypical  chest pain no PND, with negative cardiac enzymes negative, EKG is unchanged. But I am concerned about unstable angina. - We'll transfer him to come to the step down start him on IV nitroglycerin we should help with his elevated blood pressure. He relates he did take his blood pressure this morning. - I will cycle cardiac enzymes and get a 2-D echo. Have already talked to cardiology which she was seen consultation. - He receive aspirin and Plavix in the ED, will continue statins.  DM type 2 (diabetes mellitus, type 2): - Change his long-acting insulin to twice a day start him on sliding scale insulin check a hemoglobin A1c.  Essential hypertension - He relates he took his blood pressure medications this morning his blood pressure is elevated I will go ahead and start him on nitroglycerin IV and resume his home medications.  Chronic diastolic CHF (congestive heart failure) - His illness not in respiratory distress she has 2+ edema his lungs are clear no JVD BNP is 73. - Continue his torsemide, hydralazine, ACE inhibitor and metoprolol.  Chronic renal insufficiency, stage III (moderate) - Creatinine is at baseline continue to monitor.  I will already discussed the case cardiology which was seen consultation  Code Status: Full DVT Prophylaxis:heparin Family Communication: none Disposition Plan: observation  Time spent: 70 min  Eugene [name] [facility] Pager [phone]

## 2014-12-25 NOTE — ED Provider Notes (Signed)
CSN: [id number]     Arrival date & time 12/25/14  1251 History   First MD Initiated Contact with Patient 12/25/14 1353     Chief Complaint  Patient presents with  . Chest Pain     (Consider location/radiation/quality/duration/timing/severity/associated sxs/prior Treatment) HPI Comments: Pt comes in with cc of chest pain. Chest pain is L sided, intermittent x 2 days. Pain appears to be reproduced when he lays on left side, of if he has a depp cough. Pt has hx of COPD, CAD, CHF. He reports no new cough, and has no increased wheezing. Pt has associated dib, and now gets short of breath with minimal exertion. There is no n/v/f/c. Pt taking his meds as prescribed. No recent stress test.   ROS 10 Systems reviewed and are negative for acute change except as noted in the HPI.     Patient is a 59 y.o. male presenting with chest pain. The history is provided by the patient and medical records.  Chest Pain Associated symptoms: shortness of breath     Past Medical History  Diagnosis Date  . Arteriosclerotic cardiovascular disease (ASCVD)     a. 05/2011 Cath/PCI: LM nl, LAD 31m, D1 small, D2 small 27m, LCX large 40p, RCA 50-60p, 99 hazy @ origin of PDA with 70-80 in PDA (2.5x26 Resolute Integrity & 3.0x15 Resolute Integrity DES).;  b. 08/2012 Inflat  STEMI/Cath/PCI: LM minor irregs, LAD 50p, D1 50, LCX nl, OM1 25, RCA 30-40p, 100d (treated with 2.75x77mm Promus Premier DES);  c. 08/2012 Echo: EF 55-60%, basal inferopost HK.  [name] Hyperlipidemia   . Diabetes mellitus     Peripheral neuropathy  . Bell palsy   . Hypertension   . COPD (chronic obstructive pulmonary disease)   . Sleep apnea   . Gallstones   . Cholelithiasis 07/2012    Asymptomatic; identified incidentally  . PONV (postoperative nausea and vomiting)   . Anxiety   . C. difficile colitis     a. 08/2012  . Myocardial infarct 09/08/12  . Nephrolithiasis   . Contrast dye induced nephropathy     a. 08/2012 post cath/pci  . CHF  (congestive heart failure)   . Asthma   . Heart disease   . Old myocardial infarction   . Anginal pain   . GERD (gastroesophageal reflux disease)    Past Surgical History  Procedure Laterality Date  . Circumcision    . Stents    . Esophagogastroduodenoscopy      in [place] over 20 yrs ago  . Colonoscopy      In [facility], approximately 2011 per patient, was normal. Advised to come back in 10 years.  . Esophagogastroduodenoscopy  06/12/2012    [id number] esophagus-status post Venia Minks dilation. Abnormal gastric mucosa of uncertain significance-status post biopsy  . Cardiac catheterization    . Subxyphoid pericardial window N/A 06/23/2013    Procedure: SUBXYPHOID PERICARDIAL WINDOW;  Surgeon: Grace Isaac, MD;  Location: [place];  Service: Thoracic;  Laterality: N/A;  . Intraoperative transesophageal echocardiogram N/A 06/23/2013    Procedure: INTRAOPERATIVE TRANSESOPHAGEAL ECHOCARDIOGRAM;  Surgeon: Grace Isaac, MD;  Location: [place];  Service: Open Heart Surgery;  Laterality: N/A;  . Left heart catheterization with coronary angiogram N/A 09/08/2012    Procedure: LEFT HEART CATHETERIZATION WITH CORONARY ANGIOGRAM;  Surgeon: [name], MD;  Location: [facility] CATH LAB;  Service: Cardiovascular;  Laterality: N/A;  . Chest tube insertion     Family History  Problem Relation Age of Onset  .  Diabetes Mother   . Heart attack Mother   . Stroke Mother   . Diabetes Sister   . Sleep apnea Sister   . Hypertension Brother   . Diabetes Brother   . Colon cancer Neg Hx   . Liver disease Neg Hx   . Diabetes Brother   . Hypertension Brother    History  Substance Use Topics  . Smoking status: Never Smoker   . Smokeless tobacco: Never Used  . Alcohol Use: No     Comment: heavy etoh use 30 years ago    Review of Systems  Respiratory: Positive for shortness of breath. Negative for stridor.   Cardiovascular: Positive for chest pain.  All other systems reviewed and are  negative.     Allergies  Hydrocodone; Lisinopril; Neurontin; Statins; Metformin and related; and Norvasc  Home Medications   Prior to Admission medications   Medication Sig Start Date End Date Taking? Authorizing Provider  albuterol (PROVENTIL HFA;VENTOLIN HFA) 108 (90 BASE) MCG/ACT inhaler Inhale 2 puffs into the lungs every 4 (four) hours as needed for shortness of breath. 09/28/14   [name], MD  albuterol (PROVENTIL) (2.5 MG/3ML) 0.083% nebulizer solution Inhale 3 mLs into the lungs every 6 (six) hours as needed. 09/28/14   Historical Provider, MD  ALPRAZolam (XANAX) 0.5 MG tablet TAKE 1/2-1 TABLET BY MOUTH AT BEDTIME AS NEEDED Patient taking differently: TAKE 1/2-1 TABLET BY MOUTH AT BEDTIME AS NEEDED FOR SLEEP 10/27/14   [name], MD  aspirin EC 81 MG tablet Take 81 mg by mouth at bedtime.     Historical Provider, MD  clopidogrel (PLAVIX) 75 MG tablet Take 1 tablet (75 mg total) by mouth at bedtime. 02/19/14   [name], MD  fenofibrate 160 MG tablet Take 1 tablet (160 mg total) by mouth at bedtime. 12/08/14   [name], MD  glucose blood (EASY TOUCH TEST) test strip Diabetes type 2. Dx code E11.9 12/24/14   [name], MD  hydrALAZINE (APRESOLINE) 25 MG tablet Take 1.5 tablets (37.5 mg total) by mouth 3 (three) times daily. 08/12/14   [name], MD  insulin lispro (HUMALOG KWIKPEN) 100 UNIT/ML KiwkPen 5 units are breakfast. 5 units at lunch. 7 units at supper 08/12/14   [name], MD  LANTUS SOLOSTAR 100 UNIT/ML Solostar Pen Inject 80 Units into the skin at bedtime.  05/27/14   Historical Provider, MD  losartan (COZAAR) 100 MG tablet Take 100 mg by mouth daily.  06/09/14   Historical Provider, MD  metoprolol tartrate (LOPRESSOR) 25 MG tablet Take 1 tablet (25 mg total) by mouth 2 (two) times daily. 01/29/14   [name], MD  nitroGLYCERIN (NITROSTAT) 0.4 MG SL tablet Place 0.4 mg under the tongue every 5 (five) minutes as needed for chest pain.     Historical Provider, MD  torsemide (DEMADEX) 20 MG tablet Take 2 tablets in the morning and 1 tablet in the afternoon. 12/08/14   [name], MD   BP 159/74 mmHg  Pulse 62  Temp(Src) 98 F (36.7 C) (Oral)  Resp 15  Ht 6\' 1"  (1.854 m)  Wt 273 lb (123.832 kg)  BMI 36.03 kg/m2  SpO2 96% Physical Exam  Constitutional: He is oriented to person, place, and time. He appears well-developed.  HENT:  Head: Normocephalic and atraumatic.  Eyes: Conjunctivae and EOM are normal. Pupils are equal, round, and reactive to light.  Neck: Normal range of motion. Neck supple.  Cardiovascular:  Normal rate and regular rhythm.   No murmur heard. Pulmonary/Chest: Effort normal and breath sounds normal. He has no wheezes.  Abdominal: Soft. Bowel sounds are normal. He exhibits no distension. There is no tenderness. There is no rebound and no guarding.  Musculoskeletal: He exhibits edema.  Neurological: He is alert and oriented to person, place, and time.  Skin: Skin is warm.  Nursing note and vitals reviewed.   ED Course  Procedures (including critical care time) Labs Review Labs Reviewed  CBC - Abnormal; Notable for the following:    RBC 4.21 (*)    Hemoglobin 12.2 (*)    HCT 36.6 (*)    All other components within normal limits  BASIC METABOLIC PANEL - Abnormal; Notable for the following:    Glucose, Bld 264 (*)    BUN 27 (*)    Creatinine, Ser 1.38 (*)    GFR calc non Af Amer 55 (*)    GFR calc Af Amer 64 (*)    All other components within normal limits  TROPONIN I  BRAIN NATRIURETIC PEPTIDE    Imaging Review Dg Chest Port 1 View  12/25/2014   CLINICAL DATA:  Chest pain, shortness of breath and lightheadedness for 1 hour. History of COPD, hypertension, diabetes, CHF, MI.  EXAM: PORTABLE CHEST - 1 VIEW  COMPARISON:  08/28/2014  FINDINGS: The heart is enlarged. Lungs are clear. No pulmonary edema. Mid thoracic spondylosis noted.  IMPRESSION: Cardiomegaly.   Electronically Signed   By:  Nolon Nations M.D.   On: 12/25/2014 14:12     EKG Interpretation   Date/Time:  Saturday December 25 2014 13:01:06 EDT Ventricular Rate:  80 PR Interval:  192 QRS Duration: 107 QT Interval:  377 QTC Calculation: 435 R Axis:   73 Text Interpretation:  Sinus rhythm Inferior infarct, old No significant  change since last tracing Confirmed by Kathrynn Humble, MD, Thelma Comp [id number]) on  12/25/2014 1:54:07 PM      MDM   Final diagnoses:  Angina pectoris    Pt comes in with cc of dib. Pt has hx of CAD, COPD, CHF. Exam reveals no evidence of COPD exacerbation right now. Chest pain is atypical - it is worse with palpation and with him laying on that side, but a MS pain doesn't explain the exertional dyspnea that patient has, and that can be angina equivalent in itself. BNP is neg, CXR shows no pulm edema/effusion. Will give iv lasix for the leg swelling. Initial cardiac workup is neg for any acute process. Cath from 2014 reviewed, and we will admit the patient to medicine.  Varney Biles, MD 12/25/14 1435

## 2014-12-25 NOTE — ED Notes (Signed)
PT c/o left sided chest pain x1 hour at home with c/o shortness of breath and bilateral lower leg swelling.

## 2014-12-26 DIAGNOSIS — I2510 Atherosclerotic heart disease of native coronary artery without angina pectoris: Secondary | ICD-10-CM

## 2014-12-26 DIAGNOSIS — E119 Type 2 diabetes mellitus without complications: Secondary | ICD-10-CM

## 2014-12-26 DIAGNOSIS — R0609 Other forms of dyspnea: Secondary | ICD-10-CM

## 2014-12-26 DIAGNOSIS — R0602 Shortness of breath: Secondary | ICD-10-CM | POA: Diagnosis present

## 2014-12-26 DIAGNOSIS — I5031 Acute diastolic (congestive) heart failure: Secondary | ICD-10-CM

## 2014-12-26 DIAGNOSIS — I5033 Acute on chronic diastolic (congestive) heart failure: Principal | ICD-10-CM

## 2014-12-26 DIAGNOSIS — R0600 Dyspnea, unspecified: Secondary | ICD-10-CM

## 2014-12-26 DIAGNOSIS — N179 Acute kidney failure, unspecified: Secondary | ICD-10-CM

## 2014-12-26 DIAGNOSIS — E785 Hyperlipidemia, unspecified: Secondary | ICD-10-CM

## 2014-12-26 DIAGNOSIS — I319 Disease of pericardium, unspecified: Secondary | ICD-10-CM

## 2014-12-26 DIAGNOSIS — J438 Other emphysema: Secondary | ICD-10-CM

## 2014-12-26 LAB — BASIC METABOLIC PANEL
Anion gap: 9 (ref 5–15)
BUN: 32 mg/dL — AB (ref 6–20)
CO2: 26 mmol/L (ref 22–32)
CREATININE: 1.59 mg/dL — AB (ref 0.61–1.24)
Calcium: 8.9 mg/dL (ref 8.9–10.3)
Chloride: 100 mmol/L — ABNORMAL LOW (ref 101–111)
GFR calc Af Amer: 54 mL/min — ABNORMAL LOW (ref >60)
GFR calc non Af Amer: 47 mL/min — ABNORMAL LOW (ref >60)
Glucose, Bld: 160 mg/dL — ABNORMAL HIGH (ref 70–99)
Potassium: 4.6 mmol/L (ref 3.5–5.1)
Sodium: 135 mmol/L (ref 135–145)

## 2014-12-26 LAB — LIPID PANEL
CHOLESTEROL: 251 mg/dL — AB (ref 0–200)
HDL: 27 mg/dL — AB (ref >40)
LDL Cholesterol: UNDETERMINED mg/dL (ref 0–99)
TRIGLYCERIDES: 465 mg/dL — AB (ref <150)
Total CHOL/HDL Ratio: 9.3 RATIO
VLDL: UNDETERMINED mg/dL (ref 0–40)

## 2014-12-26 LAB — GLUCOSE, CAPILLARY
GLUCOSE-CAPILLARY: 161 mg/dL — AB (ref 70–99)
Glucose-Capillary: 135 mg/dL — ABNORMAL HIGH (ref 70–99)
Glucose-Capillary: 187 mg/dL — ABNORMAL HIGH (ref 70–99)
Glucose-Capillary: 276 mg/dL — ABNORMAL HIGH (ref 70–99)

## 2014-12-26 MED ORDER — TORSEMIDE 20 MG PO TABS: 40.0000 mg | ORAL_TABLET | Freq: Every day | ORAL | Status: DC

## 2014-12-26 MED ORDER — TORSEMIDE 20 MG PO TABS
40.0000 mg | ORAL_TABLET | Freq: Two times a day (BID) | ORAL | Status: DC
  Administered 2014-12-26 – 2014-12-27 (×2): 40 mg via ORAL
  Filled 2014-12-26 (×2): qty 2

## 2014-12-26 MED ORDER — TORSEMIDE 20 MG PO TABS: 20.0000 mg | ORAL_TABLET | Freq: Every evening | ORAL | Status: DC

## 2014-12-26 MED ORDER — INSULIN GLARGINE 100 UNIT/ML ~~LOC~~ SOLN
80.0000 [IU] | Freq: Every day | SUBCUTANEOUS | Status: DC
  Administered 2014-12-26: 80 [IU] via SUBCUTANEOUS
  Filled 2014-12-26 (×2): qty 0.8

## 2014-12-26 MED ORDER — HYDRALAZINE HCL 50 MG PO TABS
50.0000 mg | ORAL_TABLET | Freq: Three times a day (TID) | ORAL | Status: DC
  Administered 2014-12-26 – 2014-12-27 (×3): 50 mg via ORAL
  Filled 2014-12-26 (×3): qty 1

## 2014-12-26 MED ORDER — TORSEMIDE 20 MG PO TABS: 40.0000 mg | ORAL_TABLET | Freq: Every morning | ORAL | Status: DC

## 2014-12-26 NOTE — Progress Notes (Signed)
2D Echocardiogram Complete.  12/26/2014   Eugene [name] [place], RDCS

## 2014-12-26 NOTE — Progress Notes (Signed)
PATIENT DETAILS Name: Eugene [name] Age: 59 y.o. Sex: male Date of Birth: 05/14/1956 Admit Date: 12/25/2014 Admitting Physician Charlynne Cousins, MD [phone] [name], MD  Subjective: Feels much better-shortness of breath is significantly improved. Has worsening lower extremity edema.  Assessment/Plan: Active Problems:   Acute diastolic heart failure: Suspect patient's shortness of breath along with lower extremity edema is likely secondary to acute diastolic heart failure. Much improved, continue with Demadex. Discontinue nitroglycerin infusion.    Hypertensive emergency: Started on nitroglycerin infusion on admission given CHF. BP markedly improved, stop nitroglycerin infusion. Hydralazine increased to 50 mg 3 times a day, continue losartan and metoprolol. Following continue to adjust accordingly.    Mild acute on chronic kidney disease stage III: ARF likely secondary to hemodynamic/diuretics. Repeat electrolytes and follow closely.     Pericardial effusion: Per cardiology note-No tamponade features. Await official reading of 2-D echocardiogram    History of CAD: Has history of multiple PCI to RCA. Cardiology doubts shortness of breath from unstable angina. Troponins negative. Continue with aspirin, metoprolol and Crestor      DM type 2 (diabetes mellitus, type 2): CBGs well-controlled. Continue with Lantus 18 units daily at bedtime and SSI. Follow    Dyslipidemia: Continue with fibrates and Crestor. Has been intolerant to other statins (muscle aches) in the past.     COPD: Lungs are clear. Continue with as needed bronchodilator regimen.  Disposition: Remain inpatient  Antimicrobial agents  See below  Anti-infectives    None      DVT Prophylaxis: Prophylactic Heparin  Code Status: Full code   Family Communication None at bedside  Procedures: None  CONSULTS:  cardiology  Time spent 40 minutes-which includes 50% of the time with  face-to-face with patient/ family and coordinating care related to the above assessment and plan.  MEDICATIONS: Scheduled Meds: . aspirin EC  325 mg Oral Daily  . fenofibrate  160 mg Oral QHS  . heparin  5,000 Units Subcutaneous 3 times per day  . hydrALAZINE  50 mg Oral TID  . insulin aspart  0-5 Units Subcutaneous QHS  . insulin aspart  0-9 Units Subcutaneous TID WC  . insulin aspart  3 Units Subcutaneous TID WC  . insulin glargine  80 Units Subcutaneous QHS  . losartan  100 mg Oral Daily  . metoprolol tartrate  25 mg Oral BID  . rosuvastatin  20 mg Oral q1800  . torsemide  40 mg Oral BID   Continuous Infusions: . nitroGLYCERIN 5 mcg/min (12/25/14 2219)   PRN Meds:.acetaminophen, albuterol, ALPRAZolam, morphine injection, nitroGLYCERIN, ondansetron (ZOFRAN) IV    PHYSICAL EXAM: Vital signs in last 24 hours: Filed Vitals:   12/26/14 0500 12/26/14 0600 12/26/14 0700 12/26/14 1027  BP: 124/75   158/78  Pulse: 61 60 59 64  Temp: 97.9 F (36.6 C)     TempSrc:      Resp: 16     Height:      Weight: 119.477 kg (263 lb 6.4 oz)     SpO2: 100% 86% 100%     Weight change:  Filed Weights   12/25/14 1259 12/25/14 1907 12/26/14 0500  Weight: 123.832 kg (273 lb) 118.978 kg (262 lb 4.8 oz) 119.477 kg (263 lb 6.4 oz)   Body mass index is 34.76 kg/(m^2).   Gen Exam: Awake and alert with clear speech.   Neck: Supple, No JVD.   Chest: B/L Clear.  CVS: S1 S2 Regular, no murmurs.  Abdomen: soft, BS +, non tender, non distended.  Extremities: 1-2+ edema, lower extremities warm to touch. Neurologic: Non Focal.   Skin: No Rash.   Wounds: N/A.    Intake/Output from previous day:  Intake/Output Summary (Last 24 hours) at 12/26/14 1536 Last data filed at 12/26/14 1244  Gross per 24 hour  Intake    240 ml  Output   2100 ml  Net  -1860 ml     LAB RESULTS: CBC  Recent Labs Lab 12/25/14 1305 12/25/14 2100  WBC 6.5 7.0  HGB 12.2* 11.2*  HCT 36.6* 33.5*  PLT 209 192    MCV 86.9 85.0  MCH 29.0 28.4  MCHC 33.3 33.4  RDW 13.5 13.5    Chemistries   Recent Labs Lab 12/25/14 1305 12/25/14 2100 12/26/14 1010  NA 135  --  135  K 4.8  --  4.6  CL 101  --  100*  CO2 26  --  26  GLUCOSE 264*  --  160*  BUN 27*  --  32*  CREATININE 1.38* 1.60* 1.59*  CALCIUM 9.1  --  8.9    CBG:  Recent Labs Lab 12/25/14 2107 12/26/14 0746 12/26/14 1201  GLUCAP 167* 161* 135*    GFR Estimated Creatinine Clearance: 69.4 mL/min (by C-G formula based on Cr of 1.59).  Coagulation profile No results for input(s): INR, PROTIME in the last 168 hours.  Cardiac Enzymes  Recent Labs Lab 12/25/14 1305 12/25/14 2100  TROPONINI 0.03 0.03    Invalid input(s): POCBNP No results for input(s): DDIMER in the last 72 hours. No results for input(s): HGBA1C in the last 72 hours. No results for input(s): CHOL, HDL, LDLCALC, TRIG, CHOLHDL, LDLDIRECT in the last 72 hours. No results for input(s): TSH, T4TOTAL, T3FREE, THYROIDAB in the last 72 hours.  Invalid input(s): FREET3 No results for input(s): VITAMINB12, FOLATE, FERRITIN, TIBC, IRON, RETICCTPCT in the last 72 hours. No results for input(s): LIPASE, AMYLASE in the last 72 hours.  Urine Studies No results for input(s): UHGB, CRYS in the last 72 hours.  Invalid input(s): UACOL, UAPR, USPG, UPH, UTP, UGL, UKET, UBIL, UNIT, UROB, ULEU, UEPI, UWBC, URBC, UBAC, CAST, UCOM, BILUA  MICROBIOLOGY: No results found for this or any previous visit (from the past 240 hour(s)).  RADIOLOGY STUDIES/RESULTS: Dg Chest Port 1 View  12/25/2014   CLINICAL DATA:  Chest pain, shortness of breath and lightheadedness for 1 hour. History of COPD, hypertension, diabetes, CHF, MI.  EXAM: PORTABLE CHEST - 1 VIEW  COMPARISON:  08/28/2014  FINDINGS: The heart is enlarged. Lungs are clear. No pulmonary edema. Mid thoracic spondylosis noted.  IMPRESSION: Cardiomegaly.   Electronically Signed   By: Nolon Nations M.D.   On: 12/25/2014 14:12     Oren Binet, MD  [facility] Pager:[phone]  If 7PM-7AM, please contact night-coverage [email or web address] Password [email or web address] 12/26/2014, 3:36 PM   LOS: 1 day

## 2014-12-26 NOTE — Consult Note (Signed)
CARDIOLOGY CONSULT NOTE  Patient ID: Eugene [name] MRN: [id number] DOB/AGE: April 04, 1956 59 y.o.  Admit date: 12/25/2014 Primary Physician [name], MD  Reason for Consultation: SOB  HPI: The patient is a 59 yr old male well known to me from clinic and most recently evaluated on 10/04/14, at which point he was stable. He has a PMH significant for CAD with multiple PCI's to the RCA, pericardial effusion s/p window, chronic diastolic heart failure, COPD, HTN, dyslipidemia, CKD stage 3, and type 2 diabetes.  His PCP, Dr. [name], started him on nebulizer therapy about a month ago and this helped is chronic dyspnea to some degree. About 4 days ago, he was walking from the living room to the kitchen and he noticed he quickly got short of breath and this was "different" from his normal dyspnea. Denies chest pain. Has had worsening bilateral leg swelling and takes torsemide 40 mg q am and 20 mg q pm. Denies syncope. Has been swimming at the [facility] without difficulty.  Nuclear stress testing on 12/14/2013 showed myocardial scar in the anterior, inferior, and septal walls with no evidence of inducible ischemia. Echocardiogram on 07/21/2014 showed normal left ventricular systolic function, EF [id number], severe LVH, and a small to moderate size circumferential pericardial effusion with no evidence of tamponade.  He presented to the ED at [facility] yesterday and was subsequently transferred here to [facility], as the hospitalist was concerned about unstable angina.  Troponins have been normal. Echocardiogram is currently being performed and preliminary views demonstrate normal LV systolic function and a moderate size pericardial effusion without tamponade. BNP normal (72). Hgb 12.2.  Chest xray without acute cardiopulmonary process.  BP elevated.     Allergies  Allergen Reactions  . Hydrocodone Nausea And Vomiting  . Lisinopril Cough  . Neurontin [Gabapentin] Other (See Comments)   Suicidal thoughts  . Statins Other (See Comments)    Muscle aches  . Metformin And Related Diarrhea and Other (See Comments)    Intestinal side effects  . Norvasc [Amlodipine Besylate] Swelling    Pedal edema    Current Facility-Administered Medications  Medication Dose Route Frequency Provider Last Rate Last Dose  . acetaminophen (TYLENOL) tablet 650 mg  650 mg Oral Q4H PRN Charlynne Cousins, MD      . albuterol (PROVENTIL) (2.5 MG/3ML) 0.083% nebulizer solution 3 mL  3 mL Inhalation Q6H PRN Charlynne Cousins, MD      . ALPRAZolam Duanne Moron) tablet 0.25 mg  0.25 mg Oral TID PRN Charlynne Cousins, MD      . aspirin EC tablet 325 mg  325 mg Oral Daily Charlynne Cousins, MD   325 mg at 12/26/14 1029  . fenofibrate tablet 160 mg  160 mg Oral QHS Charlynne Cousins, MD   160 mg at 12/25/14 2214  . heparin injection 5,000 Units  5,000 Units Subcutaneous 3 times per day Charlynne Cousins, MD   5,000 Units at 12/26/14 [phone]  . hydrALAZINE (APRESOLINE) tablet 37.5 mg  37.5 mg Oral TID Charlynne Cousins, MD   37.5 mg at 12/26/14 1030  . insulin aspart (novoLOG) injection 0-5 Units  0-5 Units Subcutaneous QHS Charlynne Cousins, MD   0 Units at 12/25/14 2215  . insulin aspart (novoLOG) injection 0-9 Units  0-9 Units Subcutaneous TID WC Charlynne Cousins, MD   1 Units at 12/26/14 1249  . insulin aspart (novoLOG) injection 3 Units  3 Units Subcutaneous TID WC  Charlynne Cousins, MD   3 Units at 12/26/14 1248  . insulin glargine (LANTUS) injection 80 Units  80 Units Subcutaneous QHS Jonetta Osgood, MD      . losartan (COZAAR) tablet 100 mg  100 mg Oral Daily Charlynne Cousins, MD   100 mg at 12/26/14 1030  . metoprolol tartrate (LOPRESSOR) tablet 25 mg  25 mg Oral BID Charlynne Cousins, MD   25 mg at 12/26/14 1027  . morphine 2 MG/ML injection 2 mg  2 mg Intravenous Q2H PRN Charlynne Cousins, MD      . nitroGLYCERIN (NITROSTAT) SL tablet 0.4 mg  0.4 mg Sublingual Q5 min PRN Charlynne Cousins, MD      . nitroGLYCERIN 50 mg in dextrose 5 % 250 mL (0.2 mg/mL) infusion  0-200 mcg/min Intravenous Titrated Charlynne Cousins, MD 1.5 mL/hr at 12/25/14 2219 5 mcg/min at 12/25/14 2219  . ondansetron (ZOFRAN) injection 4 mg  4 mg Intravenous Q6H PRN Charlynne Cousins, MD      . rosuvastatin (CRESTOR) tablet 20 mg  20 mg Oral q1800 Charlynne Cousins, MD      . Derrill Memo ON 12/27/2014] torsemide (DEMADEX) tablet 40 mg  40 mg Oral Daily Charlynne Cousins, MD        Past Medical History  Diagnosis Date  . Arteriosclerotic cardiovascular disease (ASCVD)     a. 05/2011 Cath/PCI: LM nl, LAD 63m, D1 small, D2 small 38m, LCX large 40p, RCA 50-60p, 99 hazy @ origin of PDA with 70-80 in PDA (2.5x26 Resolute Integrity & 3.0x15 Resolute Integrity DES).;  b. 08/2012 Inflat  STEMI/Cath/PCI: LM minor irregs, LAD 50p, D1 50, LCX nl, OM1 25, RCA 30-40p, 100d (treated with 2.75x70mm Promus Premier DES);  c. 08/2012 Echo: EF 55-60%, basal inferopost HK.  [name] Hyperlipidemia   . Diabetes mellitus     Peripheral neuropathy  . Bell palsy   . Hypertension   . COPD (chronic obstructive pulmonary disease)   . Sleep apnea   . Gallstones   . Cholelithiasis 07/2012    Asymptomatic; identified incidentally  . PONV (postoperative nausea and vomiting)   . Anxiety   . C. difficile colitis     a. 08/2012  . Myocardial infarct 09/08/12  . Nephrolithiasis   . Contrast dye induced nephropathy     a. 08/2012 post cath/pci  . CHF (congestive heart failure)   . Asthma   . Heart disease   . Old myocardial infarction   . Anginal pain   . GERD (gastroesophageal reflux disease)     Past Surgical History  Procedure Laterality Date  . Circumcision    . Stents    . Esophagogastroduodenoscopy      in [place] over 20 yrs ago  . Colonoscopy      In [facility], approximately 2011 per patient, was normal. Advised to come back in 10 years.  . Esophagogastroduodenoscopy  06/12/2012    [id number]  esophagus-status post Venia Minks dilation. Abnormal gastric mucosa of uncertain significance-status post biopsy  . Cardiac catheterization    . Subxyphoid pericardial window N/A 06/23/2013    Procedure: SUBXYPHOID PERICARDIAL WINDOW;  Surgeon: Grace Isaac, MD;  Location: [place];  Service: Thoracic;  Laterality: N/A;  . Intraoperative transesophageal echocardiogram N/A 06/23/2013    Procedure: INTRAOPERATIVE TRANSESOPHAGEAL ECHOCARDIOGRAM;  Surgeon: Grace Isaac, MD;  Location: [place];  Service: Open Heart Surgery;  Laterality: N/A;  . Left heart catheterization with coronary angiogram N/A 09/08/2012  Procedure: LEFT HEART CATHETERIZATION WITH CORONARY ANGIOGRAM;  Surgeon: [name], MD;  Location[facility] C[facility] LAB;  Service: Cardiovascular;  Laterality: N/A;  . Chest tube insertion      History   Social History  . Marital Status: Married    Spouse Name: N/A  . Number of Children: 2  . Years of Education: N/A   Occupational History  . Shipping     [employer]   Social History Main Topics  . Smoking status: Never Smoker   . Smokeless tobacco: Never Used  . Alcohol Use: No     Comment: heavy etoh use 30 years ago  . Drug Use: No  . Sexual Activity: Yes    Birth Control/ Protection: None   Other Topics Concern  . 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 Disabled at this point.     No family history of premature CAD in 1st degree relatives.  Prior to Admission medications   Medication Sig Start Date End Date Taking? Authorizing Provider  albuterol (PROVENTIL HFA;VENTOLIN HFA) 108 (90 BASE) MCG/ACT inhaler Inhale 2 puffs into the lungs every 4 (four) hours as needed for shortness of breath. 09/28/14  Yes [name], MD  albuterol (PROVENTIL) (2.5 MG/3ML) 0.083% nebulizer solution Inhale 3 mLs into the lungs every 6 (six) hours as needed. 09/28/14  Yes Historical Provider, MD  ALPRAZolam (XANAX) 0.5 MG tablet TAKE  1/2-1 TABLET BY MOUTH AT BEDTIME AS NEEDED Patient taking differently: TAKE 1/2-1 TABLET BY MOUTH AT BEDTIME AS NEEDED FOR SLEEP 10/27/14  Yes [name], MD  aspirin EC 81 MG tablet Take 81 mg by mouth at bedtime.    Yes Historical Provider, MD  clopidogrel (PLAVIX) 75 MG tablet Take 1 tablet (75 mg total) by mouth at bedtime. 02/19/14  Yes [name], MD  fenofibrate 160 MG tablet Take 1 tablet (160 mg total) by mouth at bedtime. 12/08/14  Yes [name], MD  hydrALAZINE (APRESOLINE) 25 MG tablet Take 1.5 tablets (37.5 mg total) by mouth 3 (three) times daily. 08/12/14  Yes [name], MD  insulin lispro (HUMALOG KWIKPEN) 100 UNIT/ML KiwkPen 5 units are breakfast. 5 units at lunch. 7 units at supper 08/12/14  Yes [name], MD  LANTUS SOLOSTAR 100 UNIT/ML Solostar Pen Inject 80 Units into the skin at bedtime.  05/27/14  Yes Historical Provider, MD  losartan (COZAAR) 100 MG tablet Take 100 mg by mouth daily.  06/09/14  Yes Historical Provider, MD  metoprolol tartrate (LOPRESSOR) 25 MG tablet Take 1 tablet (25 mg total) by mouth 2 (two) times daily. 01/29/14  Yes [name], MD  nitroGLYCERIN (NITROSTAT) 0.4 MG SL tablet Place 0.4 mg under the tongue every 5 (five) minutes as needed for chest pain.   Yes Historical Provider, MD  torsemide (DEMADEX) 20 MG tablet Take 2 tablets in the morning and 1 tablet in the afternoon. 12/08/14  Yes [name], MD  glucose blood (EASY TOUCH TEST) test strip Diabetes type 2. Dx code E11.9 12/24/14   [name], MD     Review of systems complete and found to be negative unless listed above in HPI     Physical exam Blood pressure 158/78, pulse 64, temperature 97.9 F (36.6 C), temperature source Oral, resp. rate 16, height 6\' 1"  (1.854 m), weight 263 lb 6.4 oz (119.477 kg), SpO2 100 %. General: NAD Neck: No JVD,  no thyromegaly or thyroid nodule.  Lungs: Clear to auscultation bilaterally with normal respiratory effort. CV:  Nondisplaced PMI. Regular rate and rhythm, normal S1/S2, no S3/S4, no murmur.  1+ pitting pretibial edema, L>R.  No carotid bruit.  Normal pedal pulses.  Abdomen: Firm, obese, nontender. Skin: Intact without lesions or rashes.  Neurologic: Alert and oriented x 3.  Psych: Normal affect. Extremities: No clubbing or cyanosis.  HEENT: Normal.   ECG: Most recent ECG reviewed.  Labs:   Lab Results  Component Value Date   WBC 7.0 12/25/2014   HGB 11.2* 12/25/2014   HCT 33.5* 12/25/2014   MCV 85.0 12/25/2014   PLT 192 12/25/2014    Recent Labs Lab 12/26/14 1010  NA 135  K 4.6  CL 100*  CO2 26  BUN 32*  CREATININE 1.59*  CALCIUM 8.9  GLUCOSE 160*   Lab Results  Component Value Date   CKTOTAL 179 08/22/2011   CKMB 4.9* 08/22/2011   TROPONINI 0.03 12/25/2014    Lab Results  Component Value Date   CHOL 273* 05/12/2014   CHOL 279* 01/26/2014   CHOL 278* 08/04/2013   Lab Results  Component Value Date   HDL 36* 05/12/2014   HDL 28* 01/26/2014   HDL 36* 08/04/2013   Lab Results  Component Value Date   LDLCALC NOT CALC 05/12/2014   LDLCALC UNABLE TO CALCULATE IF TRIGLYCERIDE OVER 400 mg/dL 01/26/2014   LDLCALC  08/04/2013     Comment:       Not calculated due to Triglyceride >400. Suggest ordering Direct LDL (Unit Code: [phone]).   Total Cholesterol/HDL Ratio:CHD Risk                        Coronary Heart Disease Risk Table                                        Men       Women          1/2 Average Risk              3.4        3.3              Average Risk              5.0        4.4           2X Average Risk              9.6        7.1           3X Average Risk             23.4       11.0 Use the calculated Patient Ratio above and the CHD Risk table  to determine the patient's CHD Risk. ATP III Classification (LDL):       < 100        mg/dL         Optimal      100 - 129     mg/dL         Near or Above Optimal      130 - 159     mg/dL         Borderline High       160 - 189  mg/dL         High       > 190        mg/dL         Very High     Lab Results  Component Value Date   TRIG 819* 05/12/2014   TRIG 831* 01/26/2014   TRIG 512* 08/04/2013   Lab Results  Component Value Date   CHOLHDL 7.6 05/12/2014   CHOLHDL 10.0 01/26/2014   CHOLHDL 7.7 08/04/2013   No results found for: LDLDIRECT       Studies: Dg Chest Port 1 View  12/25/2014   CLINICAL DATA:  Chest pain, shortness of breath and lightheadedness for 1 hour. History of COPD, hypertension, diabetes, CHF, MI.  EXAM: PORTABLE CHEST - 1 VIEW  COMPARISON:  08/28/2014  FINDINGS: The heart is enlarged. Lungs are clear. No pulmonary edema. Mid thoracic spondylosis noted.  IMPRESSION: Cardiomegaly.   Electronically Signed   By: Nolon Nations M.D.   On: 12/25/2014 14:12    ASSESSMENT AND PLAN:  1. SOB: Does not appear to represent his anginal equivalent. LV systolic function at least by cursory review appears normal, and pericardial effusion appears moderate in size and stable, without tamponade. BP elevated and increase in lower extremity edema over past four days. Will increase hydralazine to 50 mg tid and increase torsemide to 40 mg bid. SOB may be related to increased LV filling pressures due to HTN. I do not feel cardiac catheterization is warranted at present.  2. Essential HTN: Elevated. Increase hydralazine to 50 mg tid and continue losartan 100 mg daily.   3. Acute on chronic diastolic heart failure: Likely related to uncontrolled HTN and increased LV filling pressures. Will increase hydralazine to 50 mg tid and increase torsemide to 40 mg bid.   4. Mixed dyslipidemia: Markedly elevated lipids in 9/15 in context of diabetes and CAD. Intolerant to statins (muscle aches). Continue flaxseed oil, fenofibrate, and therapeutic lifestyle changes. Will add Zetia 10 mg daily if patient amenable at next outpatient visit (did not want to try Crestor in past), as he is trying some home remedies  now. Repeat lipids.  5. Pericardial effusion s/p pericardial window: Small to moderate in size and stable in 11/15. Cursory review of echo being performed shows moderate size effusion without tamponade. Complete review of all images to follow. Continue to monitor.   6. COPD: Stable on present regimen which includes albuterol nebs.  7. Type 2 diabetes: Stable. No changes to therapy.  8. CKD stage 3: Monitor in light of increased diuretic regimen.    Signed: Kate Sable, M.D., F.A.C.C.  12/26/2014, 12:55 PM

## 2014-12-27 ENCOUNTER — Inpatient Hospital Stay (HOSPITAL_COMMUNITY): Payer: Commercial Managed Care - HMO

## 2014-12-27 DIAGNOSIS — R0602 Shortness of breath: Secondary | ICD-10-CM

## 2014-12-27 DIAGNOSIS — M7989 Other specified soft tissue disorders: Secondary | ICD-10-CM

## 2014-12-27 LAB — GLUCOSE, CAPILLARY
GLUCOSE-CAPILLARY: 160 mg/dL — AB (ref 70–99)
Glucose-Capillary: 193 mg/dL — ABNORMAL HIGH (ref 70–99)

## 2014-12-27 LAB — BASIC METABOLIC PANEL
Anion gap: 8 (ref 5–15)
BUN: 37 mg/dL — ABNORMAL HIGH (ref 6–20)
CALCIUM: 8.7 mg/dL — AB (ref 8.9–10.3)
CO2: 29 mmol/L (ref 22–32)
Chloride: 100 mmol/L — ABNORMAL LOW (ref 101–111)
Creatinine, Ser: 1.89 mg/dL — ABNORMAL HIGH (ref 0.61–1.24)
GFR calc Af Amer: 44 mL/min — ABNORMAL LOW (ref >60)
GFR calc non Af Amer: 38 mL/min — ABNORMAL LOW (ref >60)
Glucose, Bld: 205 mg/dL — ABNORMAL HIGH (ref 70–99)
Potassium: 3.9 mmol/L (ref 3.5–5.1)
SODIUM: 137 mmol/L (ref 135–145)

## 2014-12-27 LAB — HEMOGLOBIN A1C
HEMOGLOBIN A1C: 8.4 % — AB (ref 4.8–5.6)
MEAN PLASMA GLUCOSE: 194 mg/dL

## 2014-12-27 MED ORDER — TORSEMIDE 20 MG PO TABS: ORAL_TABLET | ORAL | Status: DC

## 2014-12-27 MED ORDER — HYDRALAZINE HCL 50 MG PO TABS: 50.0000 mg | ORAL_TABLET | Freq: Three times a day (TID) | ORAL | Status: DC

## 2014-12-27 MED ORDER — ROSUVASTATIN CALCIUM 20 MG PO TABS: 20.0000 mg | ORAL_TABLET | Freq: Every day | ORAL | Status: DC

## 2014-12-27 MED ORDER — LOSARTAN POTASSIUM 50 MG PO TABS: 50.0000 mg | ORAL_TABLET | Freq: Every day | ORAL | Status: DC

## 2014-12-27 NOTE — Consult Note (Signed)
   [facility] Inpatient Consult   12/27/2014  Therron Sells Ewald 02/10/1956 [id number] Met with the patient at bedside prior to discharge. Patient was evaluated for restart of services. Patient states that he had not had any issues that required any care management services. He states that he really appreciated the nursing visits in the past and that he progressed well in the [facility] program but; he feels that he is able to manage his health care goals at this time,. Patient states that he has the [facility] information at home and he will call if he needs changes. Notified inpatient RNCM of patient stating no current needs for services. For questions, please contact: Natividad Brood, RN BSN [facility] Liaison  [phone] business mobile phone

## 2014-12-27 NOTE — Progress Notes (Signed)
SUBJECTIVE:  He thinks that he is breathing better   PHYSICAL EXAM Filed Vitals:   12/26/14 1649 12/26/14 2100 12/26/14 2315 12/27/14 0500  BP: 123/74 133/64  117/59  Pulse: 70 88  63  Temp: 98.5 F (36.9 C) 98.4 F (36.9 C)  98.5 F (36.9 C)  TempSrc: Oral     Resp: 18 15 16 16   Height:      Weight:    262 lb 8 oz (119.069 kg)  SpO2: 95% 96%  98%   General:  No distress Lungs:  Clear Heart:  RRR Abdomen:  Positive bowel sounds, no rebound no guarding Extremities:  Mild edema  LABS:  Results for orders placed or performed during the hospital encounter of 12/25/14 (from the past 24 hour(s))  Basic metabolic panel     Status: Abnormal   Collection Time: 12/26/14 10:10 AM  Result Value Ref Range   Sodium 135 135 - 145 mmol/L   Potassium 4.6 3.5 - 5.1 mmol/L   Chloride 100 (L) 101 - 111 mmol/L   CO2 26 22 - 32 mmol/L   Glucose, Bld 160 (H) 70 - 99 mg/dL   BUN 32 (H) 6 - 20 mg/dL   Creatinine, Ser 1.59 (H) 0.61 - 1.24 mg/dL   Calcium 8.9 8.9 - 10.3 mg/dL   GFR calc non Af Amer 47 (L) >60 mL/min   GFR calc Af Amer 54 (L) >60 mL/min   Anion gap 9 5 - 15  Glucose, capillary     Status: Abnormal   Collection Time: 12/26/14 12:01 PM  Result Value Ref Range   Glucose-Capillary 135 (H) 70 - 99 mg/dL  Glucose, capillary     Status: Abnormal   Collection Time: 12/26/14  4:46 PM  Result Value Ref Range   Glucose-Capillary 187 (H) 70 - 99 mg/dL  Lipid panel     Status: Abnormal   Collection Time: 12/26/14  9:00 PM  Result Value Ref Range   Cholesterol 251 (H) 0 - 200 mg/dL   Triglycerides 465 (H) <150 mg/dL   HDL 27 (L) >40 mg/dL   Total CHOL/HDL Ratio 9.3 RATIO   VLDL UNABLE TO CALCULATE IF TRIGLYCERIDE OVER 400 mg/dL 0 - 40 mg/dL   LDL Cholesterol UNABLE TO CALCULATE IF TRIGLYCERIDE OVER 400 mg/dL 0 - 99 mg/dL  Glucose, capillary     Status: Abnormal   Collection Time: 12/26/14  9:19 PM  Result Value Ref Range   Glucose-Capillary 276 (H) 70 - 99 mg/dL  Basic  metabolic panel     Status: Abnormal   Collection Time: 12/27/14  3:46 AM  Result Value Ref Range   Sodium 137 135 - 145 mmol/L   Potassium 3.9 3.5 - 5.1 mmol/L   Chloride 100 (L) 101 - 111 mmol/L   CO2 29 22 - 32 mmol/L   Glucose, Bld 205 (H) 70 - 99 mg/dL   BUN 37 (H) 6 - 20 mg/dL   Creatinine, Ser 1.89 (H) 0.61 - 1.24 mg/dL   Calcium 8.7 (L) 8.9 - 10.3 mg/dL   GFR calc non Af Amer 38 (L) >60 mL/min   GFR calc Af Amer 44 (L) >60 mL/min   Anion gap 8 5 - 15  Glucose, capillary     Status: Abnormal   Collection Time: 12/27/14  7:28 AM  Result Value Ref Range   Glucose-Capillary 160 (H) 70 - 99 mg/dL   Comment 1 Notify RN    Comment 2 Document in Chart  Intake/Output Summary (Last 24 hours) at 12/27/14 0846 Last data filed at 12/27/14 0600  Gross per 24 hour  Intake    240 ml  Output   1250 ml  Net  -1010 ml     ASSESSMENT AND PLAN:  Dyspnea:  Felt by Dr. Bronson Ing not to be related to his pericardial effusion.  Hydralazine and torsemide increased yesterday.  Secondary to acute on chronic diastolic HF.   OK to discharge on previous dose diuretic.  I discussed compression stockings and keeping his feet elevated.     Pericardial effusion:  Stable as above.   HTN:  This is being managed in the context of treating his CHF  CKD:  Creat is elevated.    He will need close follow up of his creat at discharge.    Jeneen Rinks [facility] 12/27/2014 8:46 AM

## 2014-12-27 NOTE — Care Management Note (Unsigned)
    Page 1 of 1   12/27/2014     10:53:21 AM CARE MANAGEMENT NOTE 12/27/2014  Patient:  Eugene [name], Eugene [name]   Account Number:  [id number]  Date Initiated:  12/27/2014  Documentation initiated by:  Elenor Quinones  Subjective/Objective Assessment:   Pt admitted with Chest Pain     Action/Plan:   CM will continue to follow for disposition needs   Anticipated DC Date:  12/27/2014   Anticipated DC Plan:  [place]  CM consult      Choice offered to / List presented to:             Status of service:  In process, will continue to follow Medicare Important Message given?  YES (If response is "NO", the following Medicare IM given date fields will be blank) Date Medicare IM given:  12/27/2014 Medicare IM given by:  Elenor Quinones Date Additional Medicare IM given:   Additional Medicare IM given by:    Discharge Disposition:    Per UR Regulation:    If discussed at Long Length of Stay Meetings, dates discussed:    Comments:

## 2014-12-27 NOTE — Discharge Summary (Signed)
PATIENT DETAILS Name: Eugene [name] Age: 59 y.o. Sex: male Date of Birth: June 23, 1956 MRN: [id number]. Admitting Physician: Charlynne Cousins, MD [id number] [name], MD  Admit Date: 12/25/2014 Discharge date: 12/27/2014  Recommendations for Outpatient Follow-up:  1. Please repeat BMET at next visit-creatinine trending up on discharge-may need to adjust/stop Losartan and diuretics. 2. Please make sure patient follows up cardiology in nect 1-2 weeks 3. Please recheck Lipid panel in 3-6 months-Triglycerides significantly elevated-now on Crestor and Fibrates    PRIMARY DISCHARGE DIAGNOSIS:  Active Problems:   DM type 2 (diabetes mellitus, type 2)   Essential hypertension   Chronic diastolic CHF (congestive heart failure)   SOB (shortness of breath)   Chest pain   Chronic renal insufficiency, stage III (moderate)   Atypical chest pain   SOB (shortness of breath) on exertion      PAST MEDICAL HISTORY: Past Medical History  Diagnosis Date  . Arteriosclerotic cardiovascular disease (ASCVD)     a. 05/2011 Cath/PCI: LM nl, LAD 72m, D1 small, D2 small 20m, LCX large 40p, RCA 50-60p, 99 hazy @ origin of PDA with 70-80 in PDA (2.5x26 Resolute Integrity & 3.0x15 Resolute Integrity DES).;  b. 08/2012 Inflat  STEMI/Cath/PCI: LM minor irregs, LAD 50p, D1 50, LCX nl, OM1 25, RCA 30-40p, 100d (treated with 2.75x59mm Promus Premier DES);  c. 08/2012 Echo: EF 55-60%, basal inferopost HK.  [name] Hyperlipidemia   . Diabetes mellitus     Peripheral neuropathy  . Bell palsy   . Hypertension   . COPD (chronic obstructive pulmonary disease)   . Sleep apnea   . Gallstones   . Cholelithiasis 07/2012    Asymptomatic; identified incidentally  . PONV (postoperative nausea and vomiting)   . Anxiety   . C. difficile colitis     a. 08/2012  . Myocardial infarct 09/08/12  . Nephrolithiasis   . Contrast dye induced nephropathy     a. 08/2012 post cath/pci  . CHF (congestive heart failure)   . Asthma   .  Heart disease   . Old myocardial infarction   . Anginal pain   . GERD (gastroesophageal reflux disease)     DISCHARGE MEDICATIONS: Current Discharge Medication List    START taking these medications   Details  rosuvastatin (CRESTOR) 20 MG tablet Take 1 tablet (20 mg total) by mouth daily at 6 PM. Qty: 30 tablet, Refills: 0      CONTINUE these medications which have CHANGED   Details  hydrALAZINE (APRESOLINE) 50 MG tablet Take 1 tablet (50 mg total) by mouth 3 (three) times daily. Qty: 90 tablet, Refills: 0    losartan (COZAAR) 50 MG tablet Take 1 tablet (50 mg total) by mouth daily.    torsemide (DEMADEX) 20 MG tablet Take 2 tablets in the morning and 1 tablet in the afternoon. Qty: 90 tablet, Refills: 5      CONTINUE these medications which have NOT CHANGED   Details  albuterol (PROVENTIL HFA;VENTOLIN HFA) 108 (90 BASE) MCG/ACT inhaler Inhale 2 puffs into the lungs every 4 (four) hours as needed for shortness of breath. Qty: 1 Inhaler, Refills: 2    albuterol (PROVENTIL) (2.5 MG/3ML) 0.083% nebulizer solution Inhale 3 mLs into the lungs every 6 (six) hours as needed.    ALPRAZolam (XANAX) 0.5 MG tablet TAKE 1/2-1 TABLET BY MOUTH AT BEDTIME AS NEEDED Qty: 30 tablet, Refills: 4    aspirin EC 81 MG tablet Take 81 mg by mouth at bedtime.     clopidogrel (  PLAVIX) 75 MG tablet Take 1 tablet (75 mg total) by mouth at bedtime. Qty: 90 tablet, Refills: 1    fenofibrate 160 MG tablet Take 1 tablet (160 mg total) by mouth at bedtime. Qty: 30 tablet, Refills: 5    insulin lispro (HUMALOG KWIKPEN) 100 UNIT/ML KiwkPen 5 units are breakfast. 5 units at lunch. 7 units at supper Qty: 15 mL, Refills: 3    LANTUS SOLOSTAR 100 UNIT/ML Solostar Pen Inject 80 Units into the skin at bedtime.     metoprolol tartrate (LOPRESSOR) 25 MG tablet Take 1 tablet (25 mg total) by mouth 2 (two) times daily. Qty: 60 tablet, Refills: 3    nitroGLYCERIN (NITROSTAT) 0.4 MG SL tablet Place 0.4 mg under  the tongue every 5 (five) minutes as needed for chest pain.      STOP taking these medications     glucose blood (EASY TOUCH TEST) test strip         ALLERGIES:   Allergies  Allergen Reactions  . Hydrocodone Nausea And Vomiting  . Lisinopril Cough  . Neurontin [Gabapentin] Other (See Comments)    Suicidal thoughts  . Statins Other (See Comments)    Muscle aches  . Metformin And Related Diarrhea and Other (See Comments)    Intestinal side effects  . Norvasc [Amlodipine Besylate] Swelling    Pedal edema    BRIEF HPI:  See H&P, Labs, Consult and Test reports for all details in brief, patient was admitted for evaluation on exertional dyspnea.  CONSULTATIONS:   cardiology  PERTINENT RADIOLOGIC STUDIES: Dg Chest Port 1 View  12/25/2014   CLINICAL DATA:  Chest pain, shortness of breath and lightheadedness for 1 hour. History of COPD, hypertension, diabetes, CHF, MI.  EXAM: PORTABLE CHEST - 1 VIEW  COMPARISON:  08/28/2014  FINDINGS: The heart is enlarged. Lungs are clear. No pulmonary edema. Mid thoracic spondylosis noted.  IMPRESSION: Cardiomegaly.   Electronically Signed   By: Nolon Nations M.D.   On: 12/25/2014 14:12     PERTINENT LAB RESULTS: CBC:  Recent Labs  12/25/14 1305 12/25/14 2100  WBC 6.5 7.0  HGB 12.2* 11.2*  HCT 36.6* 33.5*  PLT 209 192   CMET CMP     Component Value Date/Time   NA 137 12/27/2014 0346   K 3.9 12/27/2014 0346   CL 100* 12/27/2014 0346   CO2 29 12/27/2014 0346   GLUCOSE 205* 12/27/2014 0346   BUN 37* 12/27/2014 0346   CREATININE 1.89* 12/27/2014 0346   CREATININE 1.26 10/26/2014 1529   CREATININE 1.24 05/11/2013 1320   CALCIUM 8.7* 12/27/2014 0346   PROT 6.7 08/28/2014 2015   ALBUMIN 3.5 08/28/2014 2015   AST 27 08/28/2014 2015   ALT 29 08/28/2014 2015   ALKPHOS 67 08/28/2014 2015   BILITOT 0.5 08/28/2014 2015   GFRNONAA 38* 12/27/2014 0346   GFRAA 44* 12/27/2014 0346    GFR Estimated Creatinine Clearance: 58.3 mL/min  (by C-G formula based on Cr of 1.89). No results for input(s): LIPASE, AMYLASE in the last 72 hours.  Recent Labs  12/25/14 1305 12/25/14 2100  TROPONINI 0.03 0.03   Invalid input(s): POCBNP No results for input(s): DDIMER in the last 72 hours. No results for input(s): HGBA1C in the last 72 hours.  Recent Labs  12/26/14 2100  CHOL 251*  HDL 27*  LDLCALC UNABLE TO CALCULATE IF TRIGLYCERIDE OVER 400 mg/dL  TRIG 465*  CHOLHDL 9.3   No results for input(s): TSH, T4TOTAL, T3FREE, THYROIDAB in the last  72 hours.  Invalid input(s): FREET3 No results for input(s): VITAMINB12, FOLATE, FERRITIN, TIBC, IRON, RETICCTPCT in the last 72 hours. Coags: No results for input(s): INR in the last 72 hours.  Invalid input(s): PT Microbiology: No results found for this or any previous visit (from the past 240 hour(s)).   BRIEF HOSPITAL COURSE:  Acute diastolic heart failure: Suspect patient's shortness of breath along with lower extremity edema is likely secondary to acute diastolic heart failure from uncontrolled HTN. Started on Diuretics,and IV nitroglycerin infusion,  much improved. Slight elevation in renal function-likely secondary to diuretics/hemodynamics-suspect will improve over the next few days. Discussed with Dr Luna Kitchens advised to continue Demadex at home dose, have arranged for a quick hospital follow up with PCP where BMET should be done to check renal function and to adjust medications accordingly.Ok to discharge per cardiology. Will also need follow up with Cardiology post discharge.    Hypertensive emergency: Started on nitroglycerin infusion on admission given CHF. BP markedly improved,  nitroglycerin infusion was subsequently. Hydralazine increased to 50 mg 3 times a day, continue losartan (but dose decreased to 50 mg) and metoprolol. Follow BP closely, will need close monitoring of BP as outpatient. See above regarding renal function.   Mild acute on chronic kidney disease  stage III: ARF likely secondary to hemodynamic/diuretics. Repeat electrolytes at next visit with PCP and follow closely.    Pericardial effusion: Per cardiology note-No tamponade features. Appears to be a chronic issue   History of CAD: Has history of multiple PCI to RCA. Cardiology doubts shortness of breath from unstable angina. Troponins negative. Continue with aspirin/plavix, metoprolol and Crestor    DM type 2 (diabetes mellitus, type 2): CBGs well-controlled. Continue with Lantus on discharge   Dyslipidemia: Continue with fibrates and Crestor. Has been intolerant to other statins (muscle aches) in the past. Has significantly elevated Triglycerides-but was just started on Fibrates a week or two back-will need a repeat Lipid panel in 3-6 months.   COPD: Lungs are clear. Continue with home bronchodilator regimen.   TODAY-DAY OF DISCHARGE:  Subjective:   Eugene [name] today has no headache,no chest abdominal pain,no new weakness tingling or numbness, feels much better wants to go home today.   Objective:   Blood pressure 117/59, pulse 63, temperature 98.5 F (36.9 C), temperature source Oral, resp. rate 16, height 6\' 1"  (1.854 m), weight 119.069 kg (262 lb 8 oz), SpO2 98 %.  Intake/Output Summary (Last 24 hours) at 12/27/14 1016 Last data filed at 12/27/14 0600  Gross per 24 hour  Intake    240 ml  Output   1250 ml  Net  -1010 ml   Filed Weights   12/25/14 1907 12/26/14 0500 12/27/14 0500  Weight: 118.978 kg (262 lb 4.8 oz) 119.477 kg (263 lb 6.4 oz) 119.069 kg (262 lb 8 oz)    Exam Awake Alert, Oriented *3, No new F.N deficits, Normal affect [place].AT,PERRAL Supple Neck,No JVD, No cervical lymphadenopathy appriciated.  Symmetrical Chest wall movement, Good air movement bilaterally, CTAB RRR,No Gallops,Rubs or new Murmurs, No Parasternal Heave +ve B.Sounds, Abd Soft, Non tender, No organomegaly appriciated, No rebound -guarding or rigidity. No Cyanosis, Clubbing  or edema, No new Rash or bruise  DISCHARGE CONDITION: Stable  DISPOSITION: Home  DISCHARGE INSTRUCTIONS:    Activity:  As tolerated   Diet recommendation: Diabetic Diet Heart Healthy diet Fluid restriction 1.8 lit/day  Discharge Instructions    (HEART FAILURE PATIENTS) Call MD:  Anytime you have any of the following  symptoms: 1) 3 pound weight gain in 24 hours or 5 pounds in 1 week 2) shortness of breath, with or without a dry hacking cough 3) swelling in the hands, feet or stomach 4) if you have to sleep on extra pillows at night in order to breathe.    Complete by:  As directed      Diet - low sodium heart healthy    Complete by:  As directed      Diet general    Complete by:  As directed      Increase activity slowly    Complete by:  As directed            Follow-up Information    Follow up with [name], MD On 12/31/2014.   Specialty:  Family Medicine   Why:  hospital follow up @ 1:00pm    Contact information:   [address] [place] [address] [phone]       Follow up with Kate Sable A, MD. Schedule an appointment as soon as possible for a visit in 1 week.   Specialty:  Cardiology   Contact information:   [place] [address] [phone]       Total Time spent on discharge equals 45 minutes.  SignedOren Binet 12/27/2014 10:16 AM

## 2014-12-27 NOTE — Progress Notes (Addendum)
VASCULAR LAB PRELIMINARY  PRELIMINARY  PRELIMINARY  PRELIMINARY  Bilateral lower extremity venous Dopplers completed.    Preliminary report:  There is no DVT or SVT noted in the bilateral lower extremities.   Guinevere Ferrari RVT 12/27/2014, 9:30 AM

## 2014-12-27 NOTE — Progress Notes (Signed)
I reviewed d/c instructions and education with pt. Medications were reviewed and prescriptions given. patient belongings were packed up, taken off telemetry, and IV removed. Pt is currently waiting on ride from wife and will be escorted in wheelchair. All belongings are packed and with pt.

## 2014-12-28 NOTE — Progress Notes (Signed)
UR Completed. Vontae Court, RN, BSN.  [phone] 

## 2014-12-28 NOTE — Care Management Note (Signed)
CARE MANAGEMENT NOTE 12/28/2014  Patient:  Eugene [name], Eugene [name]   Account Number:  [id number]  Date Initiated:  12/27/2014  Documentation initiated by:  Elenor Quinones  Subjective/Objective Assessment:   Pt admitted with Chest Pain     Action/Plan:   CM will continue to follow for disposition needs   Anticipated DC Date:  12/27/2014   Anticipated DC Plan:  [place]  CM consult      Choice offered to / List presented to:             Status of service:  In process, will continue to follow Medicare Important Message given?  YES (If response is "NO", the following Medicare IM given date fields will be blank) Date Medicare IM given:  12/27/2014 Medicare IM given by:  Elenor Quinones Date Additional Medicare IM given:   Additional Medicare IM given by:    Discharge Disposition:  HOME/SELF CARE  Per UR Regulation:  Reviewed for med. necessity/level of care/duration of stay  If discussed at [place] of Stay Meetings, dates discussed:    Comments:      Elenor Quinones, RN, BSN [phone]

## 2014-12-30 ENCOUNTER — Ambulatory Visit: Payer: Medicare HMO | Admitting: Nurse Practitioner

## 2014-12-30 ENCOUNTER — Telehealth: Payer: Self-pay | Admitting: Internal Medicine

## 2014-12-30 ENCOUNTER — Encounter: Payer: Self-pay | Admitting: Nurse Practitioner

## 2014-12-30 NOTE — Telephone Encounter (Signed)
PATIENT WAS A NO SHOW AND LETTER WAS SENT  °

## 2014-12-31 ENCOUNTER — Ambulatory Visit: Payer: Commercial Managed Care - HMO | Admitting: Family Medicine

## 2015-01-04 ENCOUNTER — Emergency Department (HOSPITAL_COMMUNITY): Payer: Commercial Managed Care - HMO

## 2015-01-04 ENCOUNTER — Encounter (HOSPITAL_COMMUNITY): Payer: Self-pay | Admitting: Emergency Medicine

## 2015-01-04 ENCOUNTER — Emergency Department (HOSPITAL_COMMUNITY)
Admission: EM | Admit: 2015-01-04 | Discharge: 2015-01-04 | Disposition: A | Discharge status: Home / Self Care | Payer: Commercial Managed Care - HMO | Attending: Emergency Medicine | Admitting: Emergency Medicine

## 2015-01-04 DIAGNOSIS — Z7902 Long term (current) use of antithrombotics/antiplatelets: Secondary | ICD-10-CM | POA: Diagnosis not present

## 2015-01-04 DIAGNOSIS — Z7982 Long term (current) use of aspirin: Secondary | ICD-10-CM | POA: Insufficient documentation

## 2015-01-04 DIAGNOSIS — Y9289 Other specified places as the place of occurrence of the external cause: Secondary | ICD-10-CM | POA: Insufficient documentation

## 2015-01-04 DIAGNOSIS — Z79899 Other long term (current) drug therapy: Secondary | ICD-10-CM | POA: Diagnosis not present

## 2015-01-04 DIAGNOSIS — Z8669 Personal history of other diseases of the nervous system and sense organs: Secondary | ICD-10-CM | POA: Diagnosis not present

## 2015-01-04 DIAGNOSIS — I10 Essential (primary) hypertension: Secondary | ICD-10-CM | POA: Insufficient documentation

## 2015-01-04 DIAGNOSIS — I2510 Atherosclerotic heart disease of native coronary artery without angina pectoris: Secondary | ICD-10-CM | POA: Insufficient documentation

## 2015-01-04 DIAGNOSIS — W07XXXA Fall from chair, initial encounter: Secondary | ICD-10-CM | POA: Diagnosis not present

## 2015-01-04 DIAGNOSIS — I509 Heart failure, unspecified: Secondary | ICD-10-CM | POA: Diagnosis not present

## 2015-01-04 DIAGNOSIS — Z8619 Personal history of other infectious and parasitic diseases: Secondary | ICD-10-CM | POA: Diagnosis not present

## 2015-01-04 DIAGNOSIS — F419 Anxiety disorder, unspecified: Secondary | ICD-10-CM | POA: Diagnosis not present

## 2015-01-04 DIAGNOSIS — Y9389 Activity, other specified: Secondary | ICD-10-CM | POA: Diagnosis not present

## 2015-01-04 DIAGNOSIS — E785 Hyperlipidemia, unspecified: Secondary | ICD-10-CM | POA: Insufficient documentation

## 2015-01-04 DIAGNOSIS — M25511 Pain in right shoulder: Secondary | ICD-10-CM | POA: Diagnosis not present

## 2015-01-04 DIAGNOSIS — Z9889 Other specified postprocedural states: Secondary | ICD-10-CM | POA: Insufficient documentation

## 2015-01-04 DIAGNOSIS — Z794 Long term (current) use of insulin: Secondary | ICD-10-CM | POA: Insufficient documentation

## 2015-01-04 DIAGNOSIS — Z87442 Personal history of urinary calculi: Secondary | ICD-10-CM | POA: Diagnosis not present

## 2015-01-04 DIAGNOSIS — E119 Type 2 diabetes mellitus without complications: Secondary | ICD-10-CM | POA: Diagnosis not present

## 2015-01-04 DIAGNOSIS — S40011A Contusion of right shoulder, initial encounter: Secondary | ICD-10-CM | POA: Diagnosis not present

## 2015-01-04 DIAGNOSIS — Z8719 Personal history of other diseases of the digestive system: Secondary | ICD-10-CM | POA: Insufficient documentation

## 2015-01-04 DIAGNOSIS — S4991XA Unspecified injury of right shoulder and upper arm, initial encounter: Secondary | ICD-10-CM | POA: Diagnosis not present

## 2015-01-04 DIAGNOSIS — I252 Old myocardial infarction: Secondary | ICD-10-CM | POA: Diagnosis not present

## 2015-01-04 DIAGNOSIS — Y998 Other external cause status: Secondary | ICD-10-CM | POA: Diagnosis not present

## 2015-01-04 MED ORDER — TRAMADOL HCL 50 MG PO TABS
100.0000 mg | ORAL_TABLET | Freq: Once | ORAL | Status: AC
  Administered 2015-01-04: 100 mg via ORAL
  Filled 2015-01-04: qty 2

## 2015-01-04 MED ORDER — TRAMADOL HCL 50 MG PO TABS: 100.0000 mg | ORAL_TABLET | Freq: Four times a day (QID) | ORAL | Status: DC | PRN

## 2015-01-04 MED ORDER — ACETAMINOPHEN 500 MG PO TABS
1000.0000 mg | ORAL_TABLET | Freq: Once | ORAL | Status: AC
  Administered 2015-01-04: 1000 mg via ORAL
  Filled 2015-01-04: qty 2

## 2015-01-04 NOTE — ED Provider Notes (Signed)
CSN: [id number]     Arrival date & time 01/04/15  0312 History   First MD Initiated Contact with Patient 01/04/15 0502     Chief Complaint  Patient presents with  . Shoulder Pain     (Consider location/radiation/quality/duration/timing/severity/associated sxs/prior Treatment) HPI  Patient reports on May 9 around lunchtime he sat down in a friend's kitchen chair and it broke and he fell. He is not sure how he landed however he's been having pain in his right shoulder blade since it happened. He did not hit his head or have loss of consciousness. He has been putting some ointment on it like a liniment without improvement.  PCP Dr [name] none  Past Medical History  Diagnosis Date  . Arteriosclerotic cardiovascular disease (ASCVD)     a. 05/2011 Cath/PCI: LM nl, LAD 53m, D1 small, D2 small 43m, LCX large 40p, RCA 50-60p, 99 hazy @ origin of PDA with 70-80 in PDA (2.5x26 Resolute Integrity & 3.0x15 Resolute Integrity DES).;  b. 08/2012 Inflat  STEMI/Cath/PCI: LM minor irregs, LAD 50p, D1 50, LCX nl, OM1 25, RCA 30-40p, 100d (treated with 2.75x10mm Promus Premier DES);  c. 08/2012 Echo: EF 55-60%, basal inferopost HK.  [name] Hyperlipidemia   . Diabetes mellitus     Peripheral neuropathy  . Bell palsy   . Hypertension   . COPD (chronic obstructive pulmonary disease)   . Sleep apnea   . Gallstones   . Cholelithiasis 07/2012    Asymptomatic; identified incidentally  . PONV (postoperative nausea and vomiting)   . Anxiety   . C. difficile colitis     a. 08/2012  . Myocardial infarct 09/08/12  . Nephrolithiasis   . Contrast dye induced nephropathy     a. 08/2012 post cath/pci  . CHF (congestive heart failure)   . Asthma   . Heart disease   . Old myocardial infarction   . Anginal pain   . GERD (gastroesophageal reflux disease)    Past Surgical History  Procedure Laterality Date  . Circumcision    . Stents    . Esophagogastroduodenoscopy      in [place] over 20 yrs ago  .  Colonoscopy      In [facility], approximately 2011 per patient, was normal. Advised to come back in 10 years.  . Esophagogastroduodenoscopy  06/12/2012    [id number] esophagus-status post Venia Minks dilation. Abnormal gastric mucosa of uncertain significance-status post biopsy  . Cardiac catheterization    . Subxyphoid pericardial window N/A 06/23/2013    Procedure: SUBXYPHOID PERICARDIAL WINDOW;  Surgeon: Grace Isaac, MD;  Location: [facility];  Service: Thoracic;  Laterality: N/A;  . Intraoperative transesophageal echocardiogram N/A 06/23/2013    Procedure: INTRAOPERATIVE TRANSESOPHAGEAL ECHOCARDIOGRAM;  Surgeon: Grace Isaac, MD;  Location: [facility];  Service: Open Heart Surgery;  Laterality: N/A;  . Left heart catheterization with coronary angiogram N/A 09/08/2012    Procedure: LEFT HEART CATHETERIZATION WITH CORONARY ANGIOGRAM;  Surgeon: [name], MD;  Location: [facility] CATH LAB;  Service: Cardiovascular;  Laterality: N/A;  . Chest tube insertion     Family History  Problem Relation Age of Onset  . Diabetes Mother   . Heart attack Mother   . Stroke Mother   . Diabetes Sister   . Sleep apnea Sister   . Hypertension Brother   . Diabetes Brother   . Colon cancer Neg Hx   . Liver disease Neg Hx   . Diabetes Brother   . Hypertension Brother  History  Substance Use Topics  . Smoking status: Never Smoker   . Smokeless tobacco: Never Used  . Alcohol Use: No     Comment: heavy etoh use 30 years ago  on disability for CHF  Review of Systems  All other systems reviewed and are negative.     Allergies  Hydrocodone; Lisinopril; Neurontin; Statins; Metformin and related; and Norvasc  Home Medications   Prior to Admission medications   Medication Sig Start Date End Date Taking? Authorizing Provider  albuterol (PROVENTIL HFA;VENTOLIN HFA) 108 (90 BASE) MCG/ACT inhaler Inhale 2 puffs into the lungs every 4 (four) hours as needed for shortness of breath. 09/28/14   [name], MD  albuterol (PROVENTIL) (2.5 MG/3ML) 0.083% nebulizer solution Inhale 3 mLs into the lungs every 6 (six) hours as needed. 09/28/14   Historical Provider, MD  ALPRAZolam (XANAX) 0.5 MG tablet TAKE 1/2-1 TABLET BY MOUTH AT BEDTIME AS NEEDED Patient taking differently: TAKE 1/2-1 TABLET BY MOUTH AT BEDTIME AS NEEDED FOR SLEEP 10/27/14   [name], MD  aspirin EC 81 MG tablet Take 81 mg by mouth at bedtime.     Historical Provider, MD  clopidogrel (PLAVIX) 75 MG tablet Take 1 tablet (75 mg total) by mouth at bedtime. 02/19/14   [name], MD  fenofibrate 160 MG tablet Take 1 tablet (160 mg total) by mouth at bedtime. 12/08/14   [name], MD  hydrALAZINE (APRESOLINE) 50 MG tablet Take 1 tablet (50 mg total) by mouth 3 (three) times daily. 12/27/14   Shanker Kristeen Mans, MD  insulin lispro (HUMALOG KWIKPEN) 100 UNIT/ML KiwkPen 5 units are breakfast. 5 units at lunch. 7 units at supper 08/12/14   [name], MD  LANTUS SOLOSTAR 100 UNIT/ML Solostar Pen Inject 80 Units into the skin at bedtime.  05/27/14   Historical Provider, MD  losartan (COZAAR) 50 MG tablet Take 1 tablet (50 mg total) by mouth daily. 12/28/14   Shanker Kristeen Mans, MD  metoprolol tartrate (LOPRESSOR) 25 MG tablet Take 1 tablet (25 mg total) by mouth 2 (two) times daily. 01/29/14   [name], MD  nitroGLYCERIN (NITROSTAT) 0.4 MG SL tablet Place 0.4 mg under the tongue every 5 (five) minutes as needed for chest pain.    Historical Provider, MD  rosuvastatin (CRESTOR) 20 MG tablet Take 1 tablet (20 mg total) by mouth daily at 6 PM. 12/27/14   Jonetta Osgood, MD  torsemide (DEMADEX) 20 MG tablet Take 2 tablets in the morning and 1 tablet in the afternoon. 12/28/14   Shanker Kristeen Mans, MD  traMADol (ULTRAM) 50 MG tablet Take 2 tablets (100 mg total) by mouth every 6 (six) hours as needed. 01/04/15   Rolland Porter, MD   BP 163/74 mmHg  Pulse 64  Temp(Src) 98.7 F (37.1 C)  Resp 18  Ht 6\' 1"  (1.854 m)  Wt 263 lb (119.296  kg)  BMI 34.71 kg/m2  SpO2 97%  Vital signs normal   Physical Exam  Constitutional: He is oriented to person, place, and time. He appears well-developed and well-nourished.  Non-toxic appearance. He does not appear ill. No distress.  HENT:  Head: Normocephalic and atraumatic.  Right Ear: External ear normal.  Left Ear: External ear normal.  Nose: Nose normal. No mucosal edema or rhinorrhea.  Mouth/Throat: Mucous membranes are normal. No dental abscesses or uvula swelling.  Eyes: Conjunctivae and EOM are normal. Pupils are equal, round, and reactive to light.  Neck:  Full passive range of motion without pain.  Pulmonary/Chest: Effort normal and breath sounds normal. No respiratory distress. He has no wheezes. He has no rhonchi. He has no rales. He exhibits no tenderness and no crepitus.  Abdominal: Normal appearance.  Musculoskeletal: Normal range of motion. He exhibits tenderness. He exhibits no edema.       Back:  Moves all extremities well.  Patient is not tender to palpation in his right shoulder. He is painful to palpation in his right scapula without bruising or swelling seen no crepitus was felt.  Neurological: He is alert and oriented to person, place, and time. He has normal strength. No cranial nerve deficit.  Skin: Skin is warm, dry and intact. No rash noted. No erythema. No pallor.  Psychiatric: He has a normal mood and affect. His speech is normal and behavior is normal. His mood appears not anxious.  Nursing note and vitals reviewed.   ED Course  Procedures (including critical care time)  Medications  traMADol (ULTRAM) tablet 100 mg (not administered)  acetaminophen (TYLENOL) tablet 1,000 mg (not administered)    Labs Review Labs Reviewed - No data to display  Imaging Review Dg Scapula Right  01/04/2015   CLINICAL DATA:  Right posterior shoulder pain after a fall.  EXAM: RIGHT SCAPULA - 2+ VIEWS  COMPARISON:  01/04/2015 right shoulder.  FINDINGS: There is no  evidence of fracture or other focal bone lesions. Soft tissues are unremarkable.  IMPRESSION: Negative.   Electronically Signed   By: Lucienne Capers M.D.   On: 01/04/2015 06:35   Dg Shoulder Right  01/04/2015   CLINICAL DATA:  Right shoulder pain after a fall yesterday.  EXAM: RIGHT SHOULDER - 2+ VIEW  COMPARISON:  None.  FINDINGS: There is no evidence of fracture or dislocation. There is no evidence of arthropathy or other focal bone abnormality. Soft tissues are unremarkable.  IMPRESSION: Negative.   Electronically Signed   By: Lucienne Capers M.D.   On: 01/04/2015 04:17     EKG Interpretation None      MDM   Final diagnoses:  Contusion, scapular region, right, initial encounter    New Prescriptions   TRAMADOL (ULTRAM) 50 MG TABLET    Take 2 tablets (100 mg total) by mouth every 6 (six) hours as needed.    Plan discharge  Rolland Porter, MD, Barbette Or, MD 01/04/15 [phone]

## 2015-01-04 NOTE — ED Notes (Signed)
Pt fell when chair broke last night and c/o rt posterior shoulder pain.

## 2015-01-04 NOTE — Discharge Instructions (Signed)
Ice packs to the injured areas. Take the tramadol for pain with acetaminophen 1000 mg 4 times a day. Your xrays do not show a fracture. You can have Dr [name] or Dr Aline Brochure, the orthopedist on call recheck you if still painful in the next week. Recheck if you get a fever, shortness of breath.  Contusion A contusion is a deep bruise. Contusions are the result of an injury that caused bleeding under the skin. The contusion may turn blue, purple, or yellow. Minor injuries will give you a painless contusion, but more severe contusions may stay painful and swollen for a few weeks.  CAUSES  A contusion is usually caused by a blow, trauma, or direct force to an area of the body. SYMPTOMS   Swelling and redness of the injured area.  Bruising of the injured area.  Tenderness and soreness of the injured area.  Pain. DIAGNOSIS  The diagnosis can be made by taking a history and physical exam. An X-ray, CT scan, or MRI may be needed to determine if there were any associated injuries, such as fractures. TREATMENT  Specific treatment will depend on what area of the body was injured. In general, the best treatment for a contusion is resting, icing, elevating, and applying cold compresses to the injured area. Over-the-counter medicines may also be recommended for pain control. Ask your caregiver what the best treatment is for your contusion. HOME CARE INSTRUCTIONS   Put ice on the injured area.  Put ice in a plastic bag.  Place a towel between your skin and the bag.  Leave the ice on for 15-20 minutes, 3-4 times a day, or as directed by your health care provider.  Only take over-the-counter or prescription medicines for pain, discomfort, or fever as directed by your caregiver. Your caregiver may recommend avoiding anti-inflammatory medicines (aspirin, ibuprofen, and naproxen) for 48 hours because these medicines may increase bruising.  Rest the injured area.  If possible, elevate the injured area to  reduce swelling. SEEK IMMEDIATE MEDICAL CARE IF:   You have increased bruising or swelling.  You have pain that is getting worse.  Your swelling or pain is not relieved with medicines. MAKE SURE YOU:   Understand these instructions.  Will watch your condition.  Will get help right away if you are not doing well or get worse. Document Released: 05/23/2005 Document Revised: 08/18/2013 Document Reviewed: 06/18/2011 [facility] Patient Information 2015 [place], [place]. This information is not intended to replace advice given to you by your health care provider. Make sure you discuss any questions you have with your health care provider.

## 2015-01-07 NOTE — Telephone Encounter (Signed)
Noted  

## 2015-01-18 DIAGNOSIS — Z79899 Other long term (current) drug therapy: Secondary | ICD-10-CM | POA: Diagnosis not present

## 2015-01-18 DIAGNOSIS — E119 Type 2 diabetes mellitus without complications: Secondary | ICD-10-CM | POA: Diagnosis not present

## 2015-01-18 DIAGNOSIS — E785 Hyperlipidemia, unspecified: Secondary | ICD-10-CM | POA: Diagnosis not present

## 2015-01-18 DIAGNOSIS — I10 Essential (primary) hypertension: Secondary | ICD-10-CM | POA: Diagnosis not present

## 2015-01-19 LAB — HEPATIC FUNCTION PANEL
ALT: 16 IU/L (ref 0–44)
AST: 20 IU/L (ref 0–40)
Albumin: 3.6 g/dL (ref 3.5–5.5)
Alkaline Phosphatase: 56 IU/L (ref 39–117)
Bilirubin Total: 0.3 mg/dL (ref 0.0–1.2)
Bilirubin, Direct: 0.11 mg/dL (ref 0.00–0.40)
Total Protein: 6.1 g/dL (ref 6.0–8.5)

## 2015-01-19 LAB — LIPID PANEL
CHOLESTEROL TOTAL: 249 mg/dL — AB (ref 100–199)
Chol/HDL Ratio: 8.3 ratio units — ABNORMAL HIGH (ref 0.0–5.0)
HDL: 30 mg/dL — AB (ref >39)
Triglycerides: 432 mg/dL — ABNORMAL HIGH (ref 0–149)

## 2015-01-19 LAB — BASIC METABOLIC PANEL
BUN / CREAT RATIO: 19 (ref 9–20)
BUN: 26 mg/dL — AB (ref 6–24)
CO2: 25 mmol/L (ref 18–29)
CREATININE: 1.37 mg/dL — AB (ref 0.76–1.27)
Calcium: 8.9 mg/dL (ref 8.7–10.2)
Chloride: 103 mmol/L (ref 97–108)
GFR calc Af Amer: 66 mL/min/{1.73_m2} (ref >59)
GFR, EST NON AFRICAN AMERICAN: 57 mL/min/{1.73_m2} — AB (ref >59)
Glucose: 144 mg/dL — ABNORMAL HIGH (ref 65–99)
Potassium: 4.9 mmol/L (ref 3.5–5.2)
SODIUM: 142 mmol/L (ref 134–144)

## 2015-01-19 LAB — MICROALBUMIN, URINE: Microalbumin, Urine: 2779.6 ug/mL

## 2015-01-20 ENCOUNTER — Inpatient Hospital Stay (HOSPITAL_COMMUNITY)
Admission: EM | Admit: 2015-01-20 | Discharge: 2015-01-22 | DRG: 558 | Disposition: A | Discharge status: Home / Self Care | Payer: Commercial Managed Care - HMO | Attending: Family Medicine | Admitting: Family Medicine

## 2015-01-20 ENCOUNTER — Encounter (HOSPITAL_COMMUNITY): Payer: Self-pay

## 2015-01-20 ENCOUNTER — Encounter: Payer: Self-pay | Admitting: Family Medicine

## 2015-01-20 ENCOUNTER — Emergency Department (HOSPITAL_COMMUNITY): Payer: Commercial Managed Care - HMO

## 2015-01-20 DIAGNOSIS — M2550 Pain in unspecified joint: Secondary | ICD-10-CM | POA: Diagnosis not present

## 2015-01-20 DIAGNOSIS — M064 Inflammatory polyarthropathy: Secondary | ICD-10-CM | POA: Diagnosis present

## 2015-01-20 DIAGNOSIS — M25521 Pain in right elbow: Secondary | ICD-10-CM

## 2015-01-20 DIAGNOSIS — J45909 Unspecified asthma, uncomplicated: Secondary | ICD-10-CM | POA: Diagnosis present

## 2015-01-20 DIAGNOSIS — G4730 Sleep apnea, unspecified: Secondary | ICD-10-CM | POA: Diagnosis present

## 2015-01-20 DIAGNOSIS — Z7982 Long term (current) use of aspirin: Secondary | ICD-10-CM

## 2015-01-20 DIAGNOSIS — E1149 Type 2 diabetes mellitus with other diabetic neurological complication: Secondary | ICD-10-CM | POA: Diagnosis present

## 2015-01-20 DIAGNOSIS — K219 Gastro-esophageal reflux disease without esophagitis: Secondary | ICD-10-CM | POA: Diagnosis present

## 2015-01-20 DIAGNOSIS — Z7902 Long term (current) use of antithrombotics/antiplatelets: Secondary | ICD-10-CM | POA: Diagnosis not present

## 2015-01-20 DIAGNOSIS — M7020 Olecranon bursitis, unspecified elbow: Secondary | ICD-10-CM | POA: Diagnosis present

## 2015-01-20 DIAGNOSIS — E785 Hyperlipidemia, unspecified: Secondary | ICD-10-CM | POA: Diagnosis present

## 2015-01-20 DIAGNOSIS — I252 Old myocardial infarction: Secondary | ICD-10-CM | POA: Diagnosis not present

## 2015-01-20 DIAGNOSIS — Z794 Long term (current) use of insulin: Secondary | ICD-10-CM | POA: Diagnosis not present

## 2015-01-20 DIAGNOSIS — E1140 Type 2 diabetes mellitus with diabetic neuropathy, unspecified: Secondary | ICD-10-CM | POA: Diagnosis not present

## 2015-01-20 DIAGNOSIS — F419 Anxiety disorder, unspecified: Secondary | ICD-10-CM | POA: Diagnosis present

## 2015-01-20 DIAGNOSIS — E1142 Type 2 diabetes mellitus with diabetic polyneuropathy: Secondary | ICD-10-CM | POA: Diagnosis present

## 2015-01-20 DIAGNOSIS — Z79899 Other long term (current) drug therapy: Secondary | ICD-10-CM

## 2015-01-20 DIAGNOSIS — J449 Chronic obstructive pulmonary disease, unspecified: Secondary | ICD-10-CM | POA: Diagnosis present

## 2015-01-20 DIAGNOSIS — M1990 Unspecified osteoarthritis, unspecified site: Secondary | ICD-10-CM | POA: Insufficient documentation

## 2015-01-20 DIAGNOSIS — N189 Chronic kidney disease, unspecified: Secondary | ICD-10-CM

## 2015-01-20 DIAGNOSIS — I2510 Atherosclerotic heart disease of native coronary artery without angina pectoris: Secondary | ICD-10-CM | POA: Diagnosis present

## 2015-01-20 DIAGNOSIS — N183 Chronic kidney disease, stage 3 (moderate): Secondary | ICD-10-CM | POA: Diagnosis present

## 2015-01-20 DIAGNOSIS — M25529 Pain in unspecified elbow: Secondary | ICD-10-CM | POA: Diagnosis present

## 2015-01-20 DIAGNOSIS — I129 Hypertensive chronic kidney disease with stage 1 through stage 4 chronic kidney disease, or unspecified chronic kidney disease: Secondary | ICD-10-CM | POA: Diagnosis present

## 2015-01-20 DIAGNOSIS — M25421 Effusion, right elbow: Secondary | ICD-10-CM | POA: Diagnosis not present

## 2015-01-20 DIAGNOSIS — N179 Acute kidney failure, unspecified: Secondary | ICD-10-CM | POA: Diagnosis present

## 2015-01-20 DIAGNOSIS — L8990 Pressure ulcer of unspecified site, unspecified stage: Secondary | ICD-10-CM

## 2015-01-20 DIAGNOSIS — E119 Type 2 diabetes mellitus without complications: Secondary | ICD-10-CM

## 2015-01-20 DIAGNOSIS — I509 Heart failure, unspecified: Secondary | ICD-10-CM | POA: Diagnosis present

## 2015-01-20 DIAGNOSIS — M7021 Olecranon bursitis, right elbow: Secondary | ICD-10-CM | POA: Diagnosis not present

## 2015-01-20 DIAGNOSIS — E1139 Type 2 diabetes mellitus with other diabetic ophthalmic complication: Secondary | ICD-10-CM | POA: Diagnosis not present

## 2015-01-20 NOTE — ED Notes (Signed)
Patient c/o right elbow/arm pain that started this morning. Patient is unsure of injury.

## 2015-01-20 NOTE — ED Provider Notes (Signed)
CSN: [id number]     Arrival date & time 01/20/15  2226 History  This chart was scribed for Orpah Greek, MD by Irene Pap, ED Scribe. This patient was seen in room APA14/APA14 and patient care was started at 11:53 PM.   Chief Complaint  Patient presents with  . Arm Injury   The history is provided by the patient. No language interpreter was used.    HPI Comments: Eugene [name] is a 59 y.o. male who presents to the Emergency Department complaining of right elbow and arm pain onset one day ago. Patient states that he has pain around his elbow that radiates down his arm and states that his hand is swollen. He denies knowing of any injury; states that he woke up with the pain and swelling. He states that he was working in the yard yesterday but denies seeing anything bite him. He denies history of gout.   Past Medical History  Diagnosis Date  . Arteriosclerotic cardiovascular disease (ASCVD)     a. 05/2011 Cath/PCI: LM nl, LAD 38m, D1 small, D2 small 37m, LCX large 40p, RCA 50-60p, 99 hazy @ origin of PDA with 70-80 in PDA (2.5x26 Resolute Integrity & 3.0x15 Resolute Integrity DES).;  b. 08/2012 Inflat  STEMI/Cath/PCI: LM minor irregs, LAD 50p, D1 50, LCX nl, OM1 25, RCA 30-40p, 100d (treated with 2.75x69mm Promus Premier DES);  c. 08/2012 Echo: EF 55-60%, basal inferopost HK.  [name] Hyperlipidemia   . Diabetes mellitus     Peripheral neuropathy  . Bell palsy   . Hypertension   . COPD (chronic obstructive pulmonary disease)   . Sleep apnea   . Gallstones   . Cholelithiasis 07/2012    Asymptomatic; identified incidentally  . PONV (postoperative nausea and vomiting)   . Anxiety   . C. difficile colitis     a. 08/2012  . Myocardial infarct 09/08/12  . Nephrolithiasis   . Contrast dye induced nephropathy     a. 08/2012 post cath/pci  . CHF (congestive heart failure)   . Asthma   . Heart disease   . Old myocardial infarction   . Anginal pain   . GERD (gastroesophageal reflux  disease)    Past Surgical History  Procedure Laterality Date  . Circumcision    . Stents    . Esophagogastroduodenoscopy      in [place] over 20 yrs ago  . Colonoscopy      In [facility], approximately 2011 per patient, was normal. Advised to come back in 10 years.  . Esophagogastroduodenoscopy  06/12/2012    [id number] esophagus-status post Venia Minks dilation. Abnormal gastric mucosa of uncertain significance-status post biopsy  . Cardiac catheterization    . Subxyphoid pericardial window N/A 06/23/2013    Procedure: SUBXYPHOID PERICARDIAL WINDOW;  Surgeon: Grace Isaac, MD;  Location: [facility];  Service: Thoracic;  Laterality: N/A;  . Intraoperative transesophageal echocardiogram N/A 06/23/2013    Procedure: INTRAOPERATIVE TRANSESOPHAGEAL ECHOCARDIOGRAM;  Surgeon: Grace Isaac, MD;  Location: [facility];  Service: Open Heart Surgery;  Laterality: N/A;  . Left heart catheterization with coronary angiogram N/A 09/08/2012    Procedure: LEFT HEART CATHETERIZATION WITH CORONARY ANGIOGRAM;  Surgeon: [name], MD;  Location: [facility] CATH LAB;  Service: Cardiovascular;  Laterality: N/A;  . Chest tube insertion     Family History  Problem Relation Age of Onset  . Diabetes Mother   . Heart attack Mother   . Stroke Mother   . Diabetes Sister   .  Sleep apnea Sister   . Hypertension Brother   . Diabetes Brother   . Colon cancer Neg Hx   . Liver disease Neg Hx   . Diabetes Brother   . Hypertension Brother    History  Substance Use Topics  . Smoking status: Never Smoker   . Smokeless tobacco: Never Used  . Alcohol Use: No     Comment: heavy etoh use 30 years ago    Review of Systems  Musculoskeletal: Positive for joint swelling and arthralgias.  All other systems reviewed and are negative.  Allergies  Hydrocodone; Lisinopril; Neurontin; Statins; Metformin and related; and Norvasc  Home Medications   Prior to Admission medications   Medication Sig Start Date End  Date Taking? Authorizing Provider  albuterol (PROVENTIL HFA;VENTOLIN HFA) 108 (90 BASE) MCG/ACT inhaler Inhale 2 puffs into the lungs every 4 (four) hours as needed for shortness of breath. 09/28/14   [name], MD  albuterol (PROVENTIL) (2.5 MG/3ML) 0.083% nebulizer solution Inhale 3 mLs into the lungs every 6 (six) hours as needed. 09/28/14   Historical Provider, MD  ALPRAZolam (XANAX) 0.5 MG tablet TAKE 1/2-1 TABLET BY MOUTH AT BEDTIME AS NEEDED Patient taking differently: TAKE 1/2-1 TABLET BY MOUTH AT BEDTIME AS NEEDED FOR SLEEP 10/27/14   [name], MD  aspirin EC 81 MG tablet Take 81 mg by mouth at bedtime.     Historical Provider, MD  clopidogrel (PLAVIX) 75 MG tablet Take 1 tablet (75 mg total) by mouth at bedtime. 02/19/14   [name], MD  fenofibrate 160 MG tablet Take 1 tablet (160 mg total) by mouth at bedtime. 12/08/14   [name], MD  hydrALAZINE (APRESOLINE) 50 MG tablet Take 1 tablet (50 mg total) by mouth 3 (three) times daily. 12/27/14   Shanker Kristeen Mans, MD  insulin lispro (HUMALOG KWIKPEN) 100 UNIT/ML KiwkPen 5 units are breakfast. 5 units at lunch. 7 units at supper 08/12/14   [name], MD  LANTUS SOLOSTAR 100 UNIT/ML Solostar Pen Inject 80 Units into the skin at bedtime.  05/27/14   Historical Provider, MD  losartan (COZAAR) 50 MG tablet Take 1 tablet (50 mg total) by mouth daily. 12/28/14   Shanker Kristeen Mans, MD  metoprolol tartrate (LOPRESSOR) 25 MG tablet Take 1 tablet (25 mg total) by mouth 2 (two) times daily. 01/29/14   [name], MD  nitroGLYCERIN (NITROSTAT) 0.4 MG SL tablet Place 0.4 mg under the tongue every 5 (five) minutes as needed for chest pain.    Historical Provider, MD  rosuvastatin (CRESTOR) 20 MG tablet Take 1 tablet (20 mg total) by mouth daily at 6 PM. 12/27/14   Jonetta Osgood, MD  torsemide (DEMADEX) 20 MG tablet Take 2 tablets in the morning and 1 tablet in the afternoon. 12/28/14   Shanker Kristeen Mans, MD  traMADol (ULTRAM) 50 MG tablet  Take 2 tablets (100 mg total) by mouth every 6 (six) hours as needed. 01/04/15   Rolland Porter, MD   BP 187/72 mmHg  Pulse 84  Temp(Src) 98.3 F (36.8 C) (Oral)  Resp 22  SpO2 99%  Physical Exam  Constitutional: He is oriented to person, place, and time. He appears well-developed and well-nourished. No distress.  HENT:  Head: Normocephalic and atraumatic.  Right Ear: Hearing normal.  Left Ear: Hearing normal.  Nose: Nose normal.  Mouth/Throat: Oropharynx is clear and moist and mucous membranes are normal.  Eyes: Conjunctivae and EOM are normal. Pupils are equal,  round, and reactive to light.  Neck: Normal range of motion. Neck supple.  Cardiovascular: Regular rhythm, S1 normal and S2 normal.  Exam reveals no gallop and no friction rub.   No murmur heard. Pulmonary/Chest: Effort normal and breath sounds normal. No respiratory distress. He exhibits no tenderness.  Abdominal: Soft. Normal appearance and bowel sounds are normal. There is no hepatosplenomegaly. There is no tenderness. There is no rebound, no guarding, no tenderness at McBurney's point and negative Murphy's sign. No hernia.  Musculoskeletal: Normal range of motion.  Swollen right elbow with joint diffusion; tenderness and pain with ROM; no erythema  Neurological: He is alert and oriented to person, place, and time. He has normal strength. No cranial nerve deficit or sensory deficit. Coordination normal. GCS eye subscore is 4. GCS verbal subscore is 5. GCS motor subscore is 6.  Skin: Skin is warm, dry and intact. No rash noted. No cyanosis.  Psychiatric: He has a normal mood and affect. His speech is normal and behavior is normal. Thought content normal.  Nursing note and vitals reviewed.   ED Course  Procedures (including critical care time)  Procedure: Arthrocentesis  Risk benefits were discussed with the patient. He did sign written consent. Timeout performed immediately before procedure. Area was marked prior to  procedure.  The elbow was positioned flexed at 90 and the area was prepped with Betadine and sterilely draped. Landmarks were identified, a medial approach was used. 1% lidocaine was injected under the skin to raise a large skin wheal for skin anesthesia. An 21-gauge needle was then advanced through the anesthetized skin with constant aspiration on a 80mL syringe. The needle was advanced into the joint capsule and synovial fluid was aspirated until there was no more return. 1 mL of serosanguineous fluid with some blood in it was returned. The needle was removed and a sterile dressing was held in place until bleeding stopped, and the Band-Aid was placed over the site. The patient tolerated this procedure well. There were no complications.  DIAGNOSTIC STUDIES: Oxygen Saturation is 99% on room air, normal by my interpretation.    COORDINATION OF CARE: 11:55 PM-Discussed treatment plan which includes withdrawal of fluid from elbow with pt at bedside and pt agreed to plan.   Labs Review Labs Reviewed  CBC WITH DIFFERENTIAL/PLATELET - Abnormal; Notable for the following:    RBC 3.74 (*)    Hemoglobin 11.0 (*)    HCT 33.0 (*)    All other components within normal limits  BASIC METABOLIC PANEL - Abnormal; Notable for the following:    Glucose, Bld 335 (*)    BUN 35 (*)    Creatinine, Ser 1.78 (*)    Calcium 8.7 (*)    GFR calc non Af Amer 41 (*)    GFR calc Af Amer 47 (*)    All other components within normal limits  SEDIMENTATION RATE - Abnormal; Notable for the following:    Sed Rate 76 (*)    All other components within normal limits  BODY FLUID CULTURE  SYNOVIAL FLUID, CRYSTAL  C-REACTIVE PROTEIN  BODY FLUID CRYSTAL  BODY FLUID CELL COUNT WITH DIFFERENTIAL    Imaging Review Dg Elbow Complete Right  01/20/2015   CLINICAL DATA:  Right elbow pain since this morning. Limited range of motion. Questionable arm injury.  EXAM: RIGHT ELBOW - COMPLETE 3+ VIEW  COMPARISON:  None.  FINDINGS:  There is a joint effusion, with uplifting of the ventral fat pad. No fracture or malalignment. Enthesopathic  spurs to the coronoid and olecranon. No erosive changes. Bone island in the capitellum.  IMPRESSION: Joint effusion without acute bony abnormality. If there was trauma, occult fracture is primary concern. If no trauma, consider inflammatory arthritis.   Electronically Signed   By: Monte Fantasia M.D.   On: 01/20/2015 23:10     EKG Interpretation None      MDM   Final diagnoses:  None   joint effusion right elbow, possible septic joint  Patient presents to the emergency department for evaluation of pain in his right elbow. Patient reports that pain was present morning upon awakening from sleep. He is unaware of any injury. Examination revealed diffuse tenderness and pain with range of motion. No overlying erythema or skin changes. X-ray did show evidence of effusion. Arthrocentesis was performed. 1.5 mL of serosanguineous fluid was obtained. Fluid was negative for crystals. Culture is pending.  Patient has markedly elevated sedimentation rate. CRP is pending. Patient has significant pain with minimal range of motion of the joint. All of this is concerning for possible joint infection. Patient is, however, afebrile. His blood blood cell count was normal. Examination of the joint did reveal some warmth but no overt erythema. Symptoms still could be secondary to an inflammatory arthritis, but cannot rule out infection in this diabetic with elevated blood sugars.  Case was discussed with Dr. Aline Brochure, on call for orthopedics. He agrees with admission of the patient with IV antibiotic, will see the patient in the hospital.  I personally performed the services described in this documentation, which was scribed in my presence. The recorded information has been reviewed and is accurate.     Orpah Greek, MD 01/21/15 [phone]

## 2015-01-21 ENCOUNTER — Encounter (HOSPITAL_COMMUNITY): Payer: Self-pay | Admitting: *Deleted

## 2015-01-21 DIAGNOSIS — J45909 Unspecified asthma, uncomplicated: Secondary | ICD-10-CM | POA: Diagnosis present

## 2015-01-21 DIAGNOSIS — E785 Hyperlipidemia, unspecified: Secondary | ICD-10-CM | POA: Diagnosis present

## 2015-01-21 DIAGNOSIS — I509 Heart failure, unspecified: Secondary | ICD-10-CM | POA: Diagnosis present

## 2015-01-21 DIAGNOSIS — Z794 Long term (current) use of insulin: Secondary | ICD-10-CM | POA: Diagnosis not present

## 2015-01-21 DIAGNOSIS — M2550 Pain in unspecified joint: Secondary | ICD-10-CM

## 2015-01-21 DIAGNOSIS — E1139 Type 2 diabetes mellitus with other diabetic ophthalmic complication: Secondary | ICD-10-CM | POA: Diagnosis not present

## 2015-01-21 DIAGNOSIS — J449 Chronic obstructive pulmonary disease, unspecified: Secondary | ICD-10-CM | POA: Diagnosis present

## 2015-01-21 DIAGNOSIS — E1142 Type 2 diabetes mellitus with diabetic polyneuropathy: Secondary | ICD-10-CM | POA: Diagnosis present

## 2015-01-21 DIAGNOSIS — M7021 Olecranon bursitis, right elbow: Secondary | ICD-10-CM | POA: Diagnosis not present

## 2015-01-21 DIAGNOSIS — M7020 Olecranon bursitis, unspecified elbow: Secondary | ICD-10-CM | POA: Diagnosis present

## 2015-01-21 DIAGNOSIS — F419 Anxiety disorder, unspecified: Secondary | ICD-10-CM | POA: Diagnosis present

## 2015-01-21 DIAGNOSIS — Z7982 Long term (current) use of aspirin: Secondary | ICD-10-CM | POA: Diagnosis not present

## 2015-01-21 DIAGNOSIS — M064 Inflammatory polyarthropathy: Secondary | ICD-10-CM | POA: Diagnosis present

## 2015-01-21 DIAGNOSIS — M25529 Pain in unspecified elbow: Secondary | ICD-10-CM | POA: Diagnosis present

## 2015-01-21 DIAGNOSIS — M25521 Pain in right elbow: Secondary | ICD-10-CM | POA: Diagnosis present

## 2015-01-21 DIAGNOSIS — I2510 Atherosclerotic heart disease of native coronary artery without angina pectoris: Secondary | ICD-10-CM | POA: Diagnosis present

## 2015-01-21 DIAGNOSIS — E1149 Type 2 diabetes mellitus with other diabetic neurological complication: Secondary | ICD-10-CM | POA: Diagnosis present

## 2015-01-21 DIAGNOSIS — N183 Chronic kidney disease, stage 3 (moderate): Secondary | ICD-10-CM | POA: Diagnosis present

## 2015-01-21 DIAGNOSIS — Z7902 Long term (current) use of antithrombotics/antiplatelets: Secondary | ICD-10-CM | POA: Diagnosis not present

## 2015-01-21 DIAGNOSIS — L8990 Pressure ulcer of unspecified site, unspecified stage: Secondary | ICD-10-CM

## 2015-01-21 DIAGNOSIS — G4730 Sleep apnea, unspecified: Secondary | ICD-10-CM | POA: Diagnosis present

## 2015-01-21 DIAGNOSIS — K219 Gastro-esophageal reflux disease without esophagitis: Secondary | ICD-10-CM | POA: Diagnosis present

## 2015-01-21 DIAGNOSIS — Z79899 Other long term (current) drug therapy: Secondary | ICD-10-CM | POA: Diagnosis not present

## 2015-01-21 DIAGNOSIS — E1140 Type 2 diabetes mellitus with diabetic neuropathy, unspecified: Secondary | ICD-10-CM | POA: Diagnosis not present

## 2015-01-21 DIAGNOSIS — I129 Hypertensive chronic kidney disease with stage 1 through stage 4 chronic kidney disease, or unspecified chronic kidney disease: Secondary | ICD-10-CM | POA: Diagnosis present

## 2015-01-21 DIAGNOSIS — I252 Old myocardial infarction: Secondary | ICD-10-CM | POA: Diagnosis not present

## 2015-01-21 LAB — BODY FLUID CELL COUNT WITH DIFFERENTIAL
EOS FL: 0 %
LYMPHS FL: 1 %
Monocyte-Macrophage-Serous Fluid: 2 % — ABNORMAL LOW (ref 50–90)
Neutrophil Count, Fluid: 97 % — ABNORMAL HIGH (ref 0–25)
Other Cells, Fluid: 0 %
Total Nucleated Cell Count, Fluid: 679 cu mm (ref 0–1000)

## 2015-01-21 LAB — CBC WITH DIFFERENTIAL/PLATELET
Basophils Absolute: 0 10*3/uL (ref 0.0–0.1)
Basophils Relative: 0 % (ref 0–1)
Eosinophils Absolute: 0.1 10*3/uL (ref 0.0–0.7)
Eosinophils Relative: 1 % (ref 0–5)
HEMATOCRIT: 33 % — AB (ref 39.0–52.0)
HEMOGLOBIN: 11 g/dL — AB (ref 13.0–17.0)
LYMPHS ABS: 2.2 10*3/uL (ref 0.7–4.0)
LYMPHS PCT: 24 % (ref 12–46)
MCH: 29.4 pg (ref 26.0–34.0)
MCHC: 33.3 g/dL (ref 30.0–36.0)
MCV: 88.2 fL (ref 78.0–100.0)
Monocytes Absolute: 0.7 10*3/uL (ref 0.1–1.0)
Monocytes Relative: 8 % (ref 3–12)
NEUTROS PCT: 67 % (ref 43–77)
Neutro Abs: 6 10*3/uL (ref 1.7–7.7)
Platelets: 193 10*3/uL (ref 150–400)
RBC: 3.74 MIL/uL — ABNORMAL LOW (ref 4.22–5.81)
RDW: 14 % (ref 11.5–15.5)
WBC: 9 10*3/uL (ref 4.0–10.5)

## 2015-01-21 LAB — GLUCOSE, CAPILLARY
GLUCOSE-CAPILLARY: 231 mg/dL — AB (ref 65–99)
GLUCOSE-CAPILLARY: 175 mg/dL — AB (ref 65–99)
GLUCOSE-CAPILLARY: 213 mg/dL — AB (ref 65–99)
Glucose-Capillary: 248 mg/dL — ABNORMAL HIGH (ref 65–99)

## 2015-01-21 LAB — URINALYSIS, ROUTINE W REFLEX MICROSCOPIC
Bilirubin Urine: NEGATIVE
GLUCOSE, UA: 250 mg/dL — AB
Hgb urine dipstick: NEGATIVE
Ketones, ur: NEGATIVE mg/dL
LEUKOCYTES UA: NEGATIVE
Nitrite: NEGATIVE
SPECIFIC GRAVITY, URINE: 1.025 (ref 1.005–1.030)
Urobilinogen, UA: 0.2 mg/dL (ref 0.0–1.0)
pH: 5.5 (ref 5.0–8.0)

## 2015-01-21 LAB — URINE MICROSCOPIC-ADD ON

## 2015-01-21 LAB — BASIC METABOLIC PANEL
Anion gap: 8 (ref 5–15)
BUN: 35 mg/dL — ABNORMAL HIGH (ref 6–20)
CALCIUM: 8.7 mg/dL — AB (ref 8.9–10.3)
CO2: 24 mmol/L (ref 22–32)
Chloride: 103 mmol/L (ref 101–111)
Creatinine, Ser: 1.78 mg/dL — ABNORMAL HIGH (ref 0.61–1.24)
GFR calc non Af Amer: 41 mL/min — ABNORMAL LOW (ref >60)
GFR, EST AFRICAN AMERICAN: 47 mL/min — AB (ref >60)
Glucose, Bld: 335 mg/dL — ABNORMAL HIGH (ref 65–99)
Potassium: 4.5 mmol/L (ref 3.5–5.1)
Sodium: 135 mmol/L (ref 135–145)

## 2015-01-21 LAB — SYNOVIAL FLUID, CRYSTAL: Crystals, Fluid: NONE SEEN

## 2015-01-21 LAB — URIC ACID: URIC ACID, SERUM: 10.1 mg/dL — AB (ref 4.4–7.6)

## 2015-01-21 LAB — SEDIMENTATION RATE: Sed Rate: 76 mm/hr — ABNORMAL HIGH (ref 0–16)

## 2015-01-21 LAB — C-REACTIVE PROTEIN: CRP: 2.1 mg/dL — ABNORMAL HIGH (ref <1.0)

## 2015-01-21 MED ORDER — PREDNISONE 10 MG PO TABS
50.0000 mg | ORAL_TABLET | Freq: Every day | ORAL | Status: DC
  Administered 2015-01-22: 50 mg via ORAL
  Filled 2015-01-21: qty 1
  Filled 2015-01-21: qty 2

## 2015-01-21 MED ORDER — DEXTROSE 5 % IV SOLN
1.0000 g | Freq: Two times a day (BID) | INTRAVENOUS | Status: DC
  Administered 2015-01-21: 1 g via INTRAVENOUS
  Filled 2015-01-21 (×7): qty 1

## 2015-01-21 MED ORDER — HYDROMORPHONE HCL 1 MG/ML IJ SOLN
1.0000 mg | INTRAMUSCULAR | Status: DC | PRN
  Filled 2015-01-21: qty 1

## 2015-01-21 MED ORDER — ONDANSETRON HCL 4 MG PO TABS: 4.0000 mg | ORAL_TABLET | Freq: Four times a day (QID) | ORAL | Status: DC | PRN

## 2015-01-21 MED ORDER — FEBUXOSTAT 40 MG PO TABS
40.0000 mg | ORAL_TABLET | Freq: Every day | ORAL | Status: DC
  Administered 2015-01-21 – 2015-01-22 (×2): 40 mg via ORAL
  Filled 2015-01-21 (×4): qty 1

## 2015-01-21 MED ORDER — ACETAMINOPHEN 325 MG PO TABS
650.0000 mg | ORAL_TABLET | Freq: Once | ORAL | Status: AC
  Administered 2015-01-21: 650 mg via ORAL
  Filled 2015-01-21: qty 2

## 2015-01-21 MED ORDER — DEXTROSE 5 % IV SOLN
INTRAVENOUS | Status: AC
  Filled 2015-01-21: qty 1

## 2015-01-21 MED ORDER — LOSARTAN POTASSIUM 50 MG PO TABS
50.0000 mg | ORAL_TABLET | Freq: Every day | ORAL | Status: DC
  Administered 2015-01-21 – 2015-01-22 (×2): 50 mg via ORAL
  Filled 2015-01-21 (×2): qty 1

## 2015-01-21 MED ORDER — FENOFIBRATE 160 MG PO TABS
160.0000 mg | ORAL_TABLET | Freq: Every day | ORAL | Status: DC
  Administered 2015-01-21: 160 mg via ORAL
  Filled 2015-01-21: qty 1

## 2015-01-21 MED ORDER — LIDOCAINE HCL (PF) 2 % IJ SOLN
10.0000 mL | Freq: Once | INTRAMUSCULAR | Status: AC
  Administered 2015-01-21: 10 mL
  Filled 2015-01-21: qty 10

## 2015-01-21 MED ORDER — ASPIRIN EC 81 MG PO TBEC
81.0000 mg | DELAYED_RELEASE_TABLET | Freq: Every day | ORAL | Status: DC
  Administered 2015-01-21: 81 mg via ORAL
  Filled 2015-01-21: qty 1

## 2015-01-21 MED ORDER — VANCOMYCIN HCL 10 G IV SOLR
2500.0000 mg | Freq: Once | INTRAVENOUS | Status: AC
  Administered 2015-01-21: 2500 mg via INTRAVENOUS
  Filled 2015-01-21: qty 2500

## 2015-01-21 MED ORDER — VANCOMYCIN HCL 10 G IV SOLR
2500.0000 mg | Freq: Once | INTRAVENOUS | Status: DC
  Filled 2015-01-21: qty 2500

## 2015-01-21 MED ORDER — ONDANSETRON HCL 4 MG/2ML IJ SOLN: 4.0000 mg | Freq: Four times a day (QID) | INTRAMUSCULAR | Status: DC | PRN

## 2015-01-21 MED ORDER — ALPRAZOLAM 0.5 MG PO TABS: 0.5000 mg | ORAL_TABLET | Freq: Three times a day (TID) | ORAL | Status: DC | PRN

## 2015-01-21 MED ORDER — METOPROLOL TARTRATE 25 MG PO TABS
25.0000 mg | ORAL_TABLET | Freq: Two times a day (BID) | ORAL | Status: DC
  Administered 2015-01-21 – 2015-01-22 (×3): 25 mg via ORAL
  Filled 2015-01-21 (×3): qty 1

## 2015-01-21 MED ORDER — ACETAMINOPHEN 325 MG PO TABS
650.0000 mg | ORAL_TABLET | Freq: Four times a day (QID) | ORAL | Status: DC | PRN
Start: 2015-01-21 — End: 2015-01-22
  Administered 2015-01-21: 650 mg via ORAL
  Filled 2015-01-21 (×3): qty 2

## 2015-01-21 MED ORDER — CLOPIDOGREL BISULFATE 75 MG PO TABS
75.0000 mg | ORAL_TABLET | Freq: Every day | ORAL | Status: DC
  Administered 2015-01-21: 75 mg via ORAL
  Filled 2015-01-21: qty 1

## 2015-01-21 MED ORDER — VANCOMYCIN HCL 500 MG IV SOLR
INTRAVENOUS | Status: AC
  Filled 2015-01-21: qty 500

## 2015-01-21 MED ORDER — INSULIN ASPART 100 UNIT/ML ~~LOC~~ SOLN
0.0000 [IU] | Freq: Every day | SUBCUTANEOUS | Status: DC
  Administered 2015-01-21: 2 [IU] via SUBCUTANEOUS

## 2015-01-21 MED ORDER — ACETAMINOPHEN 650 MG RE SUPP
650.0000 mg | Freq: Four times a day (QID) | RECTAL | Status: DC | PRN
Start: 2015-01-21 — End: 2015-01-22

## 2015-01-21 MED ORDER — ALBUTEROL SULFATE (2.5 MG/3ML) 0.083% IN NEBU: 2.5000 mg | INHALATION_SOLUTION | Freq: Four times a day (QID) | RESPIRATORY_TRACT | Status: DC | PRN

## 2015-01-21 MED ORDER — ROSUVASTATIN CALCIUM 20 MG PO TABS
20.0000 mg | ORAL_TABLET | Freq: Every day | ORAL | Status: DC
  Filled 2015-01-21 (×2): qty 1

## 2015-01-21 MED ORDER — COLCHICINE 0.6 MG PO TABS
0.6000 mg | ORAL_TABLET | Freq: Every day | ORAL | Status: DC
  Administered 2015-01-22: 0.6 mg via ORAL
  Filled 2015-01-21 (×2): qty 1

## 2015-01-21 MED ORDER — INSULIN GLARGINE 100 UNIT/ML ~~LOC~~ SOLN
40.0000 [IU] | Freq: Every day | SUBCUTANEOUS | Status: DC
  Administered 2015-01-21: 40 [IU] via SUBCUTANEOUS
  Filled 2015-01-21 (×3): qty 0.4

## 2015-01-21 MED ORDER — VANCOMYCIN HCL 10 G IV SOLR
1500.0000 mg | Freq: Once | INTRAVENOUS | Status: DC
  Filled 2015-01-21: qty 1500

## 2015-01-21 MED ORDER — VANCOMYCIN HCL 1000 MG IV SOLR
INTRAVENOUS | Status: AC
  Filled 2015-01-21: qty 2000

## 2015-01-21 MED ORDER — INSULIN ASPART 100 UNIT/ML ~~LOC~~ SOLN
0.0000 [IU] | Freq: Three times a day (TID) | SUBCUTANEOUS | Status: DC
  Administered 2015-01-21: 3 [IU] via SUBCUTANEOUS
  Administered 2015-01-21 (×2): 5 [IU] via SUBCUTANEOUS
  Administered 2015-01-22: 8 [IU] via SUBCUTANEOUS
  Administered 2015-01-22: 15 [IU] via SUBCUTANEOUS
  Administered 2015-01-22: 3 [IU] via SUBCUTANEOUS

## 2015-01-21 MED ORDER — POLYETHYLENE GLYCOL 3350 17 G PO PACK: 17.0000 g | PACK | Freq: Every day | ORAL | Status: DC | PRN

## 2015-01-21 MED ORDER — HYDRALAZINE HCL 25 MG PO TABS
50.0000 mg | ORAL_TABLET | Freq: Three times a day (TID) | ORAL | Status: DC
  Administered 2015-01-21 – 2015-01-22 (×5): 50 mg via ORAL
  Filled 2015-01-21 (×5): qty 2

## 2015-01-21 NOTE — Care Management Note (Signed)
Case Management Note  Patient Details  Name: Eugene [name] MRN: [id number] Date of Birth: 16-Apr-1956  Subjective/Objective:                  Pt admitted from home with possible septic arthritis/gout flare. Pt lives with his wife and will return home at discharge. Pt is independent with ADL's.  Action/Plan: No Cm needs noted.  Expected Discharge Date:                  Expected Discharge Plan:  Home/Self Care  In-House Referral:  NA  Discharge planning Services  CM Consult  Post Acute Care Choice:  NA Choice offered to:  NA  DME Arranged:    DME Agency:     HH Arranged:    HH Agency:     Status of Service:  Completed, signed off  Medicare Important Message Given:  Yes Date Medicare IM Given:  01/21/15 Medicare IM give by:  Christinia Gully, RN BSN CM Date Additional Medicare IM Given:    Additional Medicare Important Message give by:     If discussed at [place] of Stay Meetings, dates discussed:    Additional Comments:  Joylene Draft, RN 01/21/2015, 3:54 PM

## 2015-01-21 NOTE — Progress Notes (Signed)
ANTIBIOTIC CONSULT NOTE  Pharmacy Consult for Vancomycin Indication: r/o septic arthritis  Allergies  Allergen Reactions  . Hydrocodone Nausea And Vomiting  . Lisinopril Cough  . Neurontin [Gabapentin] Other (See Comments)    Suicidal thoughts  . Statins Other (See Comments)    Muscle aches  . Metformin And Related Diarrhea and Other (See Comments)    Intestinal side effects  . Norvasc [Amlodipine Besylate] Swelling    Pedal edema    Patient Measurements: Height: 6\' 1"  (185.4 cm) Weight: 265 lb (120.203 kg) IBW/kg (Calculated) : 79.9  Vital Signs: Temp: 98.7 F (37.1 C) (05/27 0502) Temp Source: Oral (05/27 0502) BP: 191/86 mmHg (05/27 0502) Pulse Rate: 76 (05/27 0502) Intake/Output from previous day:   Intake/Output from this shift:    Labs:  Recent Labs  01/18/15 1018 01/21/15 0018  WBC  --  9.0  HGB  --  11.0*  PLT  --  193  CREATININE 1.37* 1.78*   Estimated Creatinine Clearance: 62.2 mL/min (by C-G formula based on Cr of 1.78). No results for input(s): VANCOTROUGH, VANCOPEAK, VANCORANDOM, GENTTROUGH, GENTPEAK, GENTRANDOM, TOBRATROUGH, TOBRAPEAK, TOBRARND, AMIKACINPEAK, AMIKACINTROU, AMIKACIN in the last 72 hours.   Microbiology: No results found for this or any previous visit (from the past 720 hour(s)).  Anti-infectives    Start     Dose/Rate Route Frequency Ordered Stop   01/21/15 0900  vancomycin (VANCOCIN) 2,500 mg in sodium chloride 0.9 % 500 mL IVPB     2,500 mg 250 mL/hr over 120 Minutes Intravenous  Once 01/21/15 0738     01/21/15 0630  ceFEPIme (MAXIPIME) 1 g in dextrose 5 % 50 mL IVPB     1 g 100 mL/hr over 30 Minutes Intravenous Every 12 hours 01/21/15 0543     01/21/15 0415  vancomycin (VANCOCIN) 1,500 mg in sodium chloride 0.9 % 500 mL IVPB  Status:  Discontinued     1,500 mg 250 mL/hr over 120 Minutes Intravenous  Once 01/21/15 0407 01/21/15 0413   01/21/15 0415  vancomycin (VANCOCIN) 2,500 mg in sodium chloride 0.9 % 500 mL IVPB   Status:  Discontinued     2,500 mg 250 mL/hr over 120 Minutes Intravenous  Once 01/21/15 0415 01/21/15 [id number]      Assessment: 58 yo obese M admitted with right elbow joint swelling.  He is afebrile with normal WBC.  Arthrocentesis was performed in ED.  He was started on empiric, broad-spectrum antibiotics pending ortho eval for possible septic arthritis.   Scr is elevated above patient's baseline.  NCrCl ~ 45-75ml/min.  Goal of Therapy:  Vancomycin trough level 15-20 mcg/ml  Plan:  Vancomycin 1500mg  IV q24h Check Vancomycin trough at steady state Monitor renal function and cx data  Cefepime ordered per MD which is on manufacturer backorder- consider switch to Rocephin 2gm IV q24h   [name] 01/21/2015,7:46 AM

## 2015-01-21 NOTE — Progress Notes (Signed)
Lab with elevated uric acid, will try prednisone/colchicine/uloric, monitor renal function. ortho consult pending.

## 2015-01-21 NOTE — Progress Notes (Signed)
UR chart review completed.  

## 2015-01-21 NOTE — Progress Notes (Signed)
Inpatient Diabetes Program Recommendations  AACE/ADA: New Consensus Statement on Inpatient Glycemic Control (2013)  Target Ranges:  Prepandial:   less than 140 mg/dL      Peak postprandial:   less than 180 mg/dL (1-2 hours)      Critically ill patients:  140 - 180 mg/dL   Reason for Visit: Right elbow joint swelling  Diabetes history: DM 2 Outpatient Diabetes medications: Lantus 80 units QHS, Humalog 5-5-7 units TID Current orders for Inpatient glycemic control: Lantus 40 units QHS, Novolog 0-15 units TID, Novolog 0-5 units QHS  Inpatient Diabetes Program Recommendations Insulin - Basal: Patient did not receive basal insulin last night. The dose ordered is half of what he normally takes at home. May want to change the time from HS to Daily so the patient will have something on board. May have to increase dose depending on glucose control.  Thanks,  Tama Headings RN, MSN, [facility] Inpatient Diabetes Coordinator Team Pager [phone]

## 2015-01-21 NOTE — H&P (Signed)
[facility] History and Physical  Diana Gershon Mcclish [id number] DOB: 1955-10-03    PCP:   [name], MD   Chief Complaint: right elbow joint swelling.   HPI: Eugene [name] is an 59 y.o. male with hx of HTN, known CAD, DM, HLD, anxiety, presented to the ER with acute onset of severe right elbow joint pain, swelling, and warmth.  He denied any other joint pain or having rash.  No subjective fever or chills.  He has been on Demadex, but it is not new.  He denied tick bite or prior similar event.  Never had gout.  Work up in the ER included no leukocytosis, normal renal fx test, and his elbow tap showed no crystal.  Cell count and culture is pending, and hospitalist was asked to admit him for possible septic arthritis, after orthopedics was consulted, and Dr Aline Brochure will see him in the am.   Rewiew of Systems:  Constitutional: Negative for malaise, fever and chills. No significant weight loss or weight gain Eyes: Negative for eye pain, redness and discharge, diplopia, visual changes, or flashes of light. ENMT: Negative for ear pain, hoarseness, nasal congestion, sinus pressure and sore throat. No headaches; tinnitus, drooling, or problem swallowing. Cardiovascular: Negative for chest pain, palpitations, diaphoresis, dyspnea and peripheral edema. ; No orthopnea, PND Respiratory: Negative for cough, hemoptysis, wheezing and stridor. No pleuritic chestpain. Gastrointestinal: Negative for nausea, vomiting, diarrhea, constipation, abdominal pain, melena, blood in stool, hematemesis, jaundice and rectal bleeding.    Genitourinary: Negative for frequency, dysuria, incontinence,flank pain and hematuria; Musculoskeletal: Negative for back pain and neck pain.  Skin: . Negative for pruritus, rash, abrasions, bruising and skin lesion.; ulcerations Neuro: Negative for headache, lightheadedness and neck stiffness. Negative for weakness, altered level of consciousness , altered mental status,  extremity weakness, burning feet, involuntary movement, seizure and syncope.  Psych: negative for anxiety, depression, insomnia, tearfulness, panic attacks, hallucinations, paranoia, suicidal or homicidal ideation    Past Medical History  Diagnosis Date  . Arteriosclerotic cardiovascular disease (ASCVD)     a. 05/2011 Cath/PCI: LM nl, LAD 18m, D1 small, D2 small 18m, LCX large 40p, RCA 50-60p, 99 hazy @ origin of PDA with 70-80 in PDA (2.5x26 Resolute Integrity & 3.0x15 Resolute Integrity DES).;  b. 08/2012 Inflat  STEMI/Cath/PCI: LM minor irregs, LAD 50p, D1 50, LCX nl, OM1 25, RCA 30-40p, 100d (treated with 2.75x28mm Promus Premier DES);  c. 08/2012 Echo: EF 55-60%, basal inferopost HK.  [name] Hyperlipidemia   . Diabetes mellitus     Peripheral neuropathy  . Bell palsy   . Hypertension   . COPD (chronic obstructive pulmonary disease)   . Sleep apnea   . Gallstones   . Cholelithiasis 07/2012    Asymptomatic; identified incidentally  . PONV (postoperative nausea and vomiting)   . Anxiety   . C. difficile colitis     a. 08/2012  . Myocardial infarct 09/08/12  . Nephrolithiasis   . Contrast dye induced nephropathy     a. 08/2012 post cath/pci  . CHF (congestive heart failure)   . Asthma   . Heart disease   . Old myocardial infarction   . Anginal pain   . GERD (gastroesophageal reflux disease)     Past Surgical History  Procedure Laterality Date  . Circumcision    . Stents    . Esophagogastroduodenoscopy      in [place] yrs ago  . Colonoscopy      In [facility], approximately 2011  per patient, was normal. Advised to come back in 10 years.  . Esophagogastroduodenoscopy  06/12/2012    [id number] esophagus-status post Venia Minks dilation. Abnormal gastric mucosa of uncertain significance-status post biopsy  . Cardiac catheterization    . Subxyphoid pericardial window N/A 06/23/2013    Procedure: SUBXYPHOID PERICARDIAL WINDOW;  Surgeon: Grace Isaac, MD;   Location: [place];  Service: Thoracic;  Laterality: N/A;  . Intraoperative transesophageal echocardiogram N/A 06/23/2013    Procedure: INTRAOPERATIVE TRANSESOPHAGEAL ECHOCARDIOGRAM;  Surgeon: Grace Isaac, MD;  Location: [place];  Service: Open Heart Surgery;  Laterality: N/A;  . Left heart catheterization with coronary angiogram N/A 09/08/2012    Procedure: LEFT HEART CATHETERIZATION WITH CORONARY ANGIOGRAM;  Surgeon: [name], MD;  Location: [facility] CATH LAB;  Service: Cardiovascular;  Laterality: N/A;  . Chest tube insertion      Medications:  HOME MEDS: Prior to Admission medications   Medication Sig Start Date End Date Taking? Authorizing Provider  albuterol (PROVENTIL HFA;VENTOLIN HFA) 108 (90 BASE) MCG/ACT inhaler Inhale 2 puffs into the lungs every 4 (four) hours as needed for shortness of breath. 09/28/14   [name], MD  albuterol (PROVENTIL) (2.5 MG/3ML) 0.083% nebulizer solution Inhale 3 mLs into the lungs every 6 (six) hours as needed. 09/28/14   Historical Provider, MD  ALPRAZolam (XANAX) 0.5 MG tablet TAKE 1/2-1 TABLET BY MOUTH AT BEDTIME AS NEEDED Patient taking differently: TAKE 1/2-1 TABLET BY MOUTH AT BEDTIME AS NEEDED FOR SLEEP 10/27/14   [name], MD  aspirin EC 81 MG tablet Take 81 mg by mouth at bedtime.     Historical Provider, MD  clopidogrel (PLAVIX) 75 MG tablet Take 1 tablet (75 mg total) by mouth at bedtime. 02/19/14   [name], MD  fenofibrate 160 MG tablet Take 1 tablet (160 mg total) by mouth at bedtime. 12/08/14   [name], MD  hydrALAZINE (APRESOLINE) 50 MG tablet Take 1 tablet (50 mg total) by mouth 3 (three) times daily. 12/27/14   Shanker Kristeen Mans, MD  insulin lispro (HUMALOG KWIKPEN) 100 UNIT/ML KiwkPen 5 units are breakfast. 5 units at lunch. 7 units at supper 08/12/14   [name], MD  LANTUS SOLOSTAR 100 UNIT/ML Solostar Pen Inject 80 Units into the skin at bedtime.  05/27/14   Historical Provider, MD  losartan (COZAAR) 50 MG tablet  Take 1 tablet (50 mg total) by mouth daily. 12/28/14   Shanker Kristeen Mans, MD  metoprolol tartrate (LOPRESSOR) 25 MG tablet Take 1 tablet (25 mg total) by mouth 2 (two) times daily. 01/29/14   [name], MD  nitroGLYCERIN (NITROSTAT) 0.4 MG SL tablet Place 0.4 mg under the tongue every 5 (five) minutes as needed for chest pain.    Historical Provider, MD  rosuvastatin (CRESTOR) 20 MG tablet Take 1 tablet (20 mg total) by mouth daily at 6 PM. 12/27/14   Jonetta Osgood, MD  torsemide (DEMADEX) 20 MG tablet Take 2 tablets in the morning and 1 tablet in the afternoon. 12/28/14   Shanker Kristeen Mans, MD  traMADol (ULTRAM) 50 MG tablet Take 2 tablets (100 mg total) by mouth every 6 (six) hours as needed. 01/04/15   Rolland Porter, MD     Allergies:  Allergies  Allergen Reactions  . Hydrocodone Nausea And Vomiting  . Lisinopril Cough  . Neurontin [Gabapentin] Other (See Comments)    Suicidal thoughts  . Statins Other (See Comments)    Muscle aches  . Metformin And  Related Diarrhea and Other (See Comments)    Intestinal side effects  . Norvasc [Amlodipine Besylate] Swelling    Pedal edema    Social History:   reports that he has never smoked. He has never used smokeless tobacco. He reports that he does not drink alcohol or use illicit drugs.  Family History: Family History  Problem Relation Age of Onset  . Diabetes Mother   . Heart attack Mother   . Stroke Mother   . Diabetes Sister   . Sleep apnea Sister   . Hypertension Brother   . Diabetes Brother   . Colon cancer Neg Hx   . Liver disease Neg Hx   . Diabetes Brother   . Hypertension Brother      Physical Exam: Filed Vitals:   01/20/15 2230 01/21/15 0345  BP: 187/72 192/92  Pulse: 84 76  Temp: 98.3 F (36.8 C) 98.3 F (36.8 C)  TempSrc: Oral Oral  Resp: 22 18  SpO2: 99% 95%   Blood pressure 192/92, pulse 76, temperature 98.3 F (36.8 C), temperature source Oral, resp. rate 18, SpO2 95 %.  GEN:  Pleasant  patient lying in  the stretcher in no acute distress; cooperative with exam. PSYCH:  alert and oriented x4; does not appear anxious or depressed; affect is appropriate. HEENT: Mucous membranes pink and anicteric; PERRLA; EOM intact; no cervical lymphadenopathy nor thyromegaly or carotid bruit; no JVD; There were no stridor. Neck is very supple. Breasts:: Not examined CHEST WALL: No tenderness CHEST: Normal respiration, clear to auscultation bilaterally.  HEART: Regular rate and rhythm.  There are no murmur, rub, or gallops.   BACK: No kyphosis or scoliosis; no CVA tenderness ABDOMEN: soft and non-tender; no masses, no organomegaly, normal abdominal bowel sounds; no pannus; no intertriginous candida. There is no rebound and no distention. Rectal Exam: Not done EXTREMITIES: No bone or joint deformity; age-appropriate arthropathy of the hands and knees; no edema; no ulcerations.  There is no calf tenderness.  He has swelling and pain and there is limited movement of the right elbow.  There is pain with movement.  Genitalia: not examined PULSES: 2+ and symmetric SKIN: Normal hydration no rash or ulceration CNS: Cranial nerves 2-12 grossly intact no focal lateralizing neurologic deficit.  Speech is fluent; uvula elevated with phonation, facial symmetry and tongue midline. DTR are normal bilaterally, cerebella exam is intact, barbinski is negative and strengths are equaled bilaterally.  No sensory loss.   Labs on Admission:  Basic Metabolic Panel:  Recent Labs Lab 01/18/15 1018 01/21/15 0018  NA 142 135  K 4.9 4.5  CL 103 103  CO2 25 24  GLUCOSE 144* 335*  BUN 26* 35*  CREATININE 1.37* 1.78*  CALCIUM 8.9 8.7*   Liver Function Tests:  Recent Labs Lab 01/18/15 1018  AST 20  ALT 16  ALKPHOS 56  BILITOT 0.3  PROT 6.1   No results for input(s): LIPASE, AMYLASE in the last 168 hours. No results for input(s): AMMONIA in the last 168 hours. CBC:  Recent Labs Lab 01/21/15 0018  WBC 9.0  NEUTROABS  6.0  HGB 11.0*  HCT 33.0*  MCV 88.2  PLT 193    Radiological Exams on Admission: Dg Elbow Complete Right  01/20/2015   CLINICAL DATA:  Right elbow pain since this morning. Limited range of motion. Questionable arm injury.  EXAM: RIGHT ELBOW - COMPLETE 3+ VIEW  COMPARISON:  None.  FINDINGS: There is a joint effusion, with uplifting of the ventral fat  pad. No fracture or malalignment. Enthesopathic spurs to the coronoid and olecranon. No erosive changes. Bone island in the capitellum.  IMPRESSION: Joint effusion without acute bony abnormality. If there was trauma, occult fracture is primary concern. If no trauma, consider inflammatory arthritis.   Electronically Signed   By: Monte Fantasia M.D.   On: 01/20/2015 23:10    EKG: Independently reviewed.   Assessment/Plan Present on Admission:  . Joint pain  PLAN:  Will admit him for possible septic arthritis, and start him on IV Van/Cefepime, though I am not convinced that it is.  EDP stated that he wasn't sure that it was a successful arthocentesis, thereby needing this admission.  Will make him NPO, reduce insulin requirement, in case he needs to be taken to the OR.   Dr Aline Brochure is aware of his admission, and will see him as soon as possible.   He will be given pain meds as well.  For his HTN, will continue with his meds.   He is stable, full code, and will be admitted into the hospitalist service.  Thank you for allowing [place] to participate in his care, Dr [name].   Other plans as per orders.  Code Status: FULL Haskel Khan, MD. [facility] Pager [phone] 7pm to 7am.  01/21/2015, 3:58 AM

## 2015-01-21 NOTE — Progress Notes (Signed)
Chart has been reviewed and case been discussed with emergency room physician  Labs have been reviewed  I'll see the patient in between cases today.

## 2015-01-21 NOTE — Consult Note (Addendum)
Reason for Consult: right elbow Referring Physician: [name] is an 59 y.o. male.  HPI: right elbow swelling   He woke up with right elbow swelling including his hand, thought he should get it checked out. Came to ER aspiration no purulent material. ESR elevated, crp pending.  C/o severe rigth elbow pain and he couldn't use his arm  Denied fever chills  Past Medical History  Diagnosis Date  . Arteriosclerotic cardiovascular disease (ASCVD)     a. 05/2011 Cath/PCI: LM nl, LAD 70m D1 small, D2 small 816mLCX large 40p, RCA 50-60p, 99 hazy @ origin of PDA with 70-80 in PDA (2.5x26 Resolute Integrity & 3.0x15 Resolute Integrity DES).;  b. 08/2012 Inflat  STEMI/Cath/PCI: LM minor irregs, LAD 50p, D1 50, LCX nl, OM1 25, RCA 30-40p, 100d (treated with 2.75x3822mromus Premier DES);  c. 08/2012 Echo: EF 55-60%, basal inferopost HK.  . HMarland Kitchenperlipidemia   . Diabetes mellitus     Peripheral neuropathy  . Bell palsy   . Hypertension   . COPD (chronic obstructive pulmonary disease)   . Sleep apnea   . Gallstones   . Cholelithiasis 07/2012    Asymptomatic; identified incidentally  . PONV (postoperative nausea and vomiting)   . Anxiety   . C. difficile colitis     a. 08/2012  . Myocardial infarct 09/08/12  . Nephrolithiasis   . Contrast dye induced nephropathy     a. 08/2012 post cath/pci  . CHF (congestive heart failure)   . Asthma   . Heart disease   . Old myocardial infarction   . Anginal pain   . GERD (gastroesophageal reflux disease)     Past Surgical History  Procedure Laterality Date  . Circumcision    . Stents    . [place]  [place] [facility]      [facility]. Advised to come back in 10 years.  . Esophagogastroduodenoscopy  06/12/2012    [id number]-status post MalVenia Minkslation. Abnormal gastric mucosa of uncertain significance-status post biopsy   . Cardiac catheterization    . Subxyphoid pericardial window N/A 06/23/2013    Procedure: SUBXYPHOID PERICARDIAL WINDOW;  Surgeon: EdwGrace IsaacD;  [facility];  Laterality: N/A;  . Intraoperative transesophageal echocardiogram N/A 06/23/2013    Procedure: INTRAOPERATIVE TRANSESOPHAGEAL ECHOCARDIOGRAM;  Surgeon: EdwGrace IsaacD;  [facility];  Laterality: N/A;  . Left heart catheterization with coronary angiogram N/A 09/08/2012    Procedure: LEFT HEART CATHETERIZATION WITH CORONARY ANGIOGRAM;  Surgeon: MicSherren MochaD;  [facility];  Service: Cardiovascular;  Laterality: N/A;  . Chest tube insertion      Family History  Problem Relation Age of Onset  . Diabetes Mother   . Heart attack Mother   . Stroke Mother   . Diabetes Sister   . Sleep apnea Sister   . Hypertension Brother   . Diabetes Brother   . Colon cancer Neg Hx   . Liver disease Neg Hx   . Diabetes Brother   . Hypertension Brother     Social History:  reports that he has never smoked. He has never used smokeless tobacco. He reports that he does not drink alcohol or use illicit drugs.  Allergies:  Allergies  Allergen Reactions  . Hydrocodone Nausea And Vomiting  . Lisinopril Cough  . Neurontin [Gabapentin]  Other (See Comments)    Suicidal thoughts  . Statins Other (See Comments)    Muscle aches  . Metformin And Related Diarrhea and Other (See Comments)    Intestinal side effects  . Norvasc [Amlodipine Besylate] Swelling    Pedal edema    Medications: I have reviewed the patient's current medications.  Results for orders placed or performed during the hospital encounter of 01/20/15 (from the past 48 hour(s))  CBC with Differential/Platelet     Status: Abnormal   Collection Time: 01/21/15 12:18 AM  Result Value Ref Range   WBC 9.0 4.0 - 10.5 K/uL    Comment: RESULT REPEATED AND VERIFIED   RBC 3.74 (L) 4.22 - 5.81 MIL/uL    Hemoglobin 11.0 (L) 13.0 - 17.0 g/dL   HCT 33.0 (L) 39.0 - 52.0 %   MCV 88.2 78.0 - 100.0 fL   MCH 29.4 26.0 - 34.0 pg   MCHC 33.3 30.0 - 36.0 g/dL   RDW 14.0 11.5 - 15.5 %   Platelets 193 150 - 400 K/uL    Comment: RESULT REPEATED AND VERIFIED SPECIMEN CHECKED FOR CLOTS    Neutrophils Relative % 67 43 - 77 %   Neutro Abs 6.0 1.7 - 7.7 K/uL   Lymphocytes Relative 24 12 - 46 %   Lymphs Abs 2.2 0.7 - 4.0 K/uL   Monocytes Relative 8 3 - 12 %   Monocytes Absolute 0.7 0.1 - 1.0 K/uL   Eosinophils Relative 1 0 - 5 %   Eosinophils Absolute 0.1 0.0 - 0.7 K/uL   Basophils Relative 0 0 - 1 %   Basophils Absolute 0.0 0.0 - 0.1 K/uL   WBC Morphology WHITE COUNT CONFIRMED ON SMEAR    Smear Review LARGE PLATELETS PRESENT     Comment: PLATELETS APPEAR ADEQUATE  Basic metabolic panel     Status: Abnormal   Collection Time: 01/21/15 12:18 AM  Result Value Ref Range   Sodium 135 135 - 145 mmol/L   Potassium 4.5 3.5 - 5.1 mmol/L   Chloride 103 101 - 111 mmol/L   CO2 24 22 - 32 mmol/L   Glucose, Bld 335 (H) 65 - 99 mg/dL   BUN 35 (H) 6 - 20 mg/dL   Creatinine, Ser 1.78 (H) 0.61 - 1.24 mg/dL   Calcium 8.7 (L) 8.9 - 10.3 mg/dL   GFR calc non Af Amer 41 (L) >60 mL/min   GFR calc Af Amer 47 (L) >60 mL/min    Comment: (NOTE) The eGFR has been calculated using the CKD EPI equation. This calculation has not been validated in all clinical situations. eGFR's persistently <60 mL/min signify possible Chronic Kidney Disease.    Anion gap 8 5 - 15  Sedimentation rate     Status: Abnormal   Collection Time: 01/21/15 12:18 AM  Result Value Ref Range   Sed Rate 76 (H) 0 - 16 mm/hr  Uric acid     Status: Abnormal   Collection Time: 01/21/15 12:18 AM  Result Value Ref Range   Uric Acid, Serum 10.1 (H) 4.4 - 7.6 mg/dL  Synovial fluid, crystal     Status: None   Collection Time: 01/21/15 12:47 AM  Result Value Ref Range   Crystals, Fluid NONE SEEN   Body fluid cell count with differential     Status:  Abnormal   Collection Time: 01/21/15 12:47 AM  Result Value Ref Range   Fluid Type-FCT SYNOVIAL     Comment: CORRECTED ON 05/27 AT 0304: PREVIOUSLY  REPORTED AS Right Elbow   Color, Fluid PINK    Appearance, Fluid HAZY (A) CLEAR   WBC, Fluid 679 0 - 1000 cu mm   Neutrophil Count, Fluid 97 (H) 0 - 25 %   Lymphs, Fluid 1 %   Monocyte-Macrophage-Serous Fluid 2 (L) 50 - 90 %   Eos, Fluid 0 %   Other Cells, Fluid 0 %  Glucose, capillary     Status: Abnormal   Collection Time: 01/21/15  7:46 AM  Result Value Ref Range   Glucose-Capillary 231 (H) 65 - 99 mg/dL  Glucose, capillary     Status: Abnormal   Collection Time: 01/21/15 11:47 AM  Result Value Ref Range   Glucose-Capillary 175 (H) 65 - 99 mg/dL    Dg Elbow Complete Right  01/20/2015   CLINICAL DATA:  Right elbow pain since this morning. Limited range of motion. Questionable arm injury.  EXAM: RIGHT ELBOW - COMPLETE 3+ VIEW  COMPARISON:  None.  FINDINGS: There is a joint effusion, with uplifting of the ventral fat pad. No fracture or malalignment. Enthesopathic spurs to the coronoid and olecranon. No erosive changes. Bone island in the capitellum.  IMPRESSION: Joint effusion without acute bony abnormality. If there was trauma, occult fracture is primary concern. If no trauma, consider inflammatory arthritis.   Electronically Signed   By: Monte Fantasia M.D.   On: 01/20/2015 23:10    ROS Blood pressure 157/75, pulse 72, temperature 98.7 F (37.1 C), temperature source Oral, resp. rate 18, height _0  (1.854 m), weight 265 lb (120.203 kg), SpO2 99 %. Physical Exam  Nursing note and vitals reviewed. Constitutional: He is oriented to person, place, and time. He appears well-developed and well-nourished.  HENT:  Head: Normocephalic.  Cardiovascular: Intact distal pulses.   Musculoskeletal:       Right elbow: He exhibits decreased range of motion, swelling and effusion. He exhibits no deformity and no laceration. Tenderness found.   Lymphadenopathy:    He has no cervical adenopathy.       Right: No epitrochlear adenopathy present.  Neurological: He is alert and oriented to person, place, and time. He exhibits abnormal muscle tone.  Skin: Skin is warm and dry. No rash noted. No erythema. No pallor.  Psychiatric: He has a normal mood and affect. His behavior is normal. Judgment and thought content normal.    Assessment/Plan: Agree with prednisone will need for 7 days  Sling  Ice F/u with me after discharge wednes [address] 01/21/2015, 12:01 PM

## 2015-01-21 NOTE — Progress Notes (Addendum)
ANTIBIOTIC CONSULT NOTE-Preliminary  Pharmacy Consult for vancomycin Indication: Joint infection  Allergies  Allergen Reactions  . Hydrocodone Nausea And Vomiting  . Lisinopril Cough  . Neurontin [Gabapentin] Other (See Comments)    Suicidal thoughts  . Statins Other (See Comments)    Muscle aches  . Metformin And Related Diarrhea and Other (See Comments)    Intestinal side effects  . Norvasc [Amlodipine Besylate] Swelling    Pedal edema    Patient Measurements:   Adjusted Body Weight: 95.7  Vital Signs: Temp: 98.3 F (36.8 C) (05/27 0345) Temp Source: Oral (05/27 0345) BP: 192/92 mmHg (05/27 0345) Pulse Rate: 76 (05/27 0345)  Labs:  Recent Labs  01/18/15 1018 01/21/15 0018  WBC  --  9.0  HGB  --  11.0*  PLT  --  193  CREATININE 1.37* 1.78*    CrCl cannot be calculated (Unknown ideal weight.).  No results for input(s): VANCOTROUGH, VANCOPEAK, VANCORANDOM, GENTTROUGH, GENTPEAK, GENTRANDOM, TOBRATROUGH, TOBRAPEAK, TOBRARND, AMIKACINPEAK, AMIKACINTROU, AMIKACIN in the last 72 hours.   Microbiology: No results found for this or any previous visit (from the past 720 hour(s)).  Medical History: Past Medical History  Diagnosis Date  . Arteriosclerotic cardiovascular disease (ASCVD)     a. 05/2011 Cath/PCI: LM nl, LAD 48m, D1 small, D2 small 73m, LCX large 40p, RCA 50-60p, 99 hazy @ origin of PDA with 70-80 in PDA (2.5x26 Resolute Integrity & 3.0x15 Resolute Integrity DES).;  b. 08/2012 Inflat  STEMI/Cath/PCI: LM minor irregs, LAD 50p, D1 50, LCX nl, OM1 25, RCA 30-40p, 100d (treated with 2.75x49mm Promus Premier DES);  c. 08/2012 Echo: EF 55-60%, basal inferopost HK.  [name] Hyperlipidemia   . Diabetes mellitus     Peripheral neuropathy  . Bell palsy   . Hypertension   . COPD (chronic obstructive pulmonary disease)   . Sleep apnea   . Gallstones   . Cholelithiasis 07/2012    Asymptomatic; identified incidentally  . PONV (postoperative nausea and vomiting)   .  Anxiety   . C. difficile colitis     a. 08/2012  . Myocardial infarct 09/08/12  . Nephrolithiasis   . Contrast dye induced nephropathy     a. 08/2012 post cath/pci  . CHF (congestive heart failure)   . Asthma   . Heart disease   . Old myocardial infarction   . Anginal pain   . GERD (gastroesophageal reflux disease)     Medications:  Scheduled:    Assessment: 59 yo male with right elbow pain that radiates down his arm and hand is swollen. Pt is diabetic with elevated blood glucose, CRP pending, no erythema, but some warmth around joint.  Aspirated 1.5 mL synovial fluid from elbow and sent for Cx.  Orthopedics to see as inpatient.   Goal of Therapy:  Vancomycin trough level 15-20 mcg/ml  Plan:  Preliminary review of pertinent patient information completed.  Protocol will be initiated with a one-time dose(s) of vancomycin 2500 mg.  Forestine Na clinical pharmacist will complete review during morning rounds to assess patient and finalize treatment regimen.  Nyra Capes, RPH 01/21/2015,4:08 AM

## 2015-01-22 DIAGNOSIS — E1140 Type 2 diabetes mellitus with diabetic neuropathy, unspecified: Secondary | ICD-10-CM

## 2015-01-22 DIAGNOSIS — M7020 Olecranon bursitis, unspecified elbow: Secondary | ICD-10-CM | POA: Insufficient documentation

## 2015-01-22 DIAGNOSIS — M1990 Unspecified osteoarthritis, unspecified site: Secondary | ICD-10-CM | POA: Insufficient documentation

## 2015-01-22 DIAGNOSIS — M1380 Other specified arthritis, unspecified site: Secondary | ICD-10-CM | POA: Insufficient documentation

## 2015-01-22 DIAGNOSIS — M064 Inflammatory polyarthropathy: Secondary | ICD-10-CM

## 2015-01-22 DIAGNOSIS — M25521 Pain in right elbow: Secondary | ICD-10-CM

## 2015-01-22 DIAGNOSIS — M7021 Olecranon bursitis, right elbow: Secondary | ICD-10-CM

## 2015-01-22 LAB — CBC
HEMATOCRIT: 30.7 % — AB (ref 39.0–52.0)
HEMOGLOBIN: 10 g/dL — AB (ref 13.0–17.0)
MCH: 29.1 pg (ref 26.0–34.0)
MCHC: 32.6 g/dL (ref 30.0–36.0)
MCV: 89.2 fL (ref 78.0–100.0)
Platelets: 197 10*3/uL (ref 150–400)
RBC: 3.44 MIL/uL — ABNORMAL LOW (ref 4.22–5.81)
RDW: 14.2 % (ref 11.5–15.5)
WBC: 7.5 10*3/uL (ref 4.0–10.5)

## 2015-01-22 LAB — GLUCOSE, CAPILLARY
Glucose-Capillary: 178 mg/dL — ABNORMAL HIGH (ref 65–99)
Glucose-Capillary: 270 mg/dL — ABNORMAL HIGH (ref 65–99)
Glucose-Capillary: 384 mg/dL — ABNORMAL HIGH (ref 65–99)

## 2015-01-22 LAB — COMPREHENSIVE METABOLIC PANEL
ALK PHOS: 45 U/L (ref 38–126)
ALT: 14 U/L — ABNORMAL LOW (ref 17–63)
ANION GAP: 9 (ref 5–15)
AST: 17 U/L (ref 15–41)
Albumin: 3.3 g/dL — ABNORMAL LOW (ref 3.5–5.0)
BILIRUBIN TOTAL: 0.7 mg/dL (ref 0.3–1.2)
BUN: 37 mg/dL — AB (ref 6–20)
CALCIUM: 8.7 mg/dL — AB (ref 8.9–10.3)
CHLORIDE: 103 mmol/L (ref 101–111)
CO2: 24 mmol/L (ref 22–32)
CREATININE: 1.98 mg/dL — AB (ref 0.61–1.24)
GFR calc Af Amer: 41 mL/min — ABNORMAL LOW (ref >60)
GFR calc non Af Amer: 36 mL/min — ABNORMAL LOW (ref >60)
Glucose, Bld: 207 mg/dL — ABNORMAL HIGH (ref 65–99)
POTASSIUM: 4.5 mmol/L (ref 3.5–5.1)
Sodium: 136 mmol/L (ref 135–145)
Total Protein: 6.7 g/dL (ref 6.5–8.1)

## 2015-01-22 MED ORDER — PREDNISONE 50 MG PO TABS: 50.0000 mg | ORAL_TABLET | Freq: Every day | ORAL | Status: DC

## 2015-01-22 NOTE — Progress Notes (Signed)
Eugene [name] discharged home with wife per MD order.  Discharge instructions reviewed and discussed with the patient, all questions and concerns answered. Copy of instructions and scripts given to patient.    Medication List    TAKE these medications        albuterol 108 (90 BASE) MCG/ACT inhaler  Commonly known as:  PROVENTIL HFA;VENTOLIN HFA  Inhale 2 puffs into the lungs every 4 (four) hours as needed for shortness of breath.     albuterol (2.5 MG/3ML) 0.083% nebulizer solution  Commonly known as:  PROVENTIL  Inhale 3 mLs into the lungs every 6 (six) hours as needed.     ALPRAZolam 0.5 MG tablet  Commonly known as:  XANAX  TAKE 1/2-1 TABLET BY MOUTH AT BEDTIME AS NEEDED     aspirin EC 81 MG tablet  Take 81 mg by mouth at bedtime.     clopidogrel 75 MG tablet  Commonly known as:  PLAVIX  Take 1 tablet (75 mg total) by mouth at bedtime.     fenofibrate 160 MG tablet  Take 1 tablet (160 mg total) by mouth at bedtime.     hydrALAZINE 50 MG tablet  Commonly known as:  APRESOLINE  Take 1 tablet (50 mg total) by mouth 3 (three) times daily.     insulin lispro 100 UNIT/ML KiwkPen  Commonly known as:  HUMALOG KWIKPEN  5 units are breakfast. 5 units at lunch. 7 units at supper     LANTUS SOLOSTAR 100 UNIT/ML Solostar Pen  Generic drug:  Insulin Glargine  Inject 80 Units into the skin at bedtime.     losartan 50 MG tablet  Commonly known as:  COZAAR  Take 1 tablet (50 mg total) by mouth daily.     metoprolol tartrate 25 MG tablet  Commonly known as:  LOPRESSOR  Take 1 tablet (25 mg total) by mouth 2 (two) times daily.     nitroGLYCERIN 0.4 MG SL tablet  Commonly known as:  NITROSTAT  Place 0.4 mg under the tongue every 5 (five) minutes as needed for chest pain.     predniSONE 50 MG tablet  Commonly known as:  DELTASONE  Take 1 tablet (50 mg total) by mouth daily with breakfast.     torsemide 20 MG tablet  Commonly known as:  DEMADEX  Take 2 tablets in the  morning and 1 tablet in the afternoon.        IV site discontinued and catheter remains intact. Site without signs and symptoms of complications. Dressing and pressure applied.  Patient escorted to car by Lovena Le, RN in a wheelchair,  no distress noted upon discharge.  Regino Bellow 01/22/2015 5:23 PM

## 2015-01-22 NOTE — Discharge Summary (Signed)
Physician Discharge Summary  Eugene [name] [id number] DOB: 14-Dec-1955 DOA: 01/20/2015  PCP: Eugene Lange, MD  Admit date: 01/20/2015 Discharge date: 01/22/2015  Recommendations for Outpatient Follow-up:  1. Resolution of elbow joint pain with effusion, olecranon bursitis, inflammatory arthritis.   Follow-up Information    Follow up with Eugene Abbott, MD On 01/26/2015.   Specialties:  Orthopedic Surgery, Radiology   Why:  9:30   Contact information:   [address] [place] [address] [phone]      Discharge Diagnoses:  1. Elbow joint pain with the fusion, inflammatory arthritis, olecranon bursitis 2. Diabetes mellitus with peripheral neuropathy 3. Chronic kidney disease stage III  Discharge Condition: improved Disposition: home  Diet recommendation: Heart healthy carb modified diet  Filed Weights   01/21/15 0502 01/22/15 0535  Weight: 120.203 kg (265 lb) 119.523 kg (263 lb 8 oz)    History of present illness:  59 year old man presented with severe right elbow pain and swelling of the joint. Arthrocentesis was performed in the emergency department the patient was admitted for further evaluation to exclude septic arthritis.  Hospital Course:  Eugene [name] was admitted and initially started on antibiotics which were quickly discontinued based on fluid analysis. He was seen in consultation with orthopedics who felt that this represented an inflammatory arthritis and olecranon bursitis. Patient was placed in a sling and on oral steroids with rapid improvement and is now stable for discharge.  1. Elbow joint pain with effusion. Discussed with Dr. Aline [name] today. He recommended treatment with prednisone, sling, ice, follow-up June 1 with him. His clinical impression was inflammatory arthritis/olecranon bursitis. 2. Diabetes mellitus with peripheral neuropathy 3. CKD stage III, stable. 4. COPD   Home today as per Dr. Aline [name] with Prednisone  and close outpt follow-up.  Consultants:  Orthopedics  Procedures:  5/26 right elbow arthrocentesis  Discharge Instructions  Discharge Instructions    Activity as tolerated - No restrictions    Complete by:  As directed      Diet - low sodium heart healthy    Complete by:  As directed      Diet Carb Modified    Complete by:  As directed      Discharge instructions    Complete by:  As directed   Call your physician or seek immediate medical attention for increased pain, fever or worsening of condition.          Current Discharge Medication List    START taking these medications   Details  predniSONE (DELTASONE) 50 MG tablet Take 1 tablet (50 mg total) by mouth daily with breakfast. Qty: 7 tablet, Refills: 0      CONTINUE these medications which have NOT CHANGED   Details  albuterol (PROVENTIL HFA;VENTOLIN HFA) 108 (90 BASE) MCG/ACT inhaler Inhale 2 puffs into the lungs every 4 (four) hours as needed for shortness of breath. Qty: 1 Inhaler, Refills: 2    albuterol (PROVENTIL) (2.5 MG/3ML) 0.083% nebulizer solution Inhale 3 mLs into the lungs every 6 (six) hours as needed.    ALPRAZolam (XANAX) 0.5 MG tablet TAKE 1/2-1 TABLET BY MOUTH AT BEDTIME AS NEEDED Qty: 30 tablet, Refills: 4    aspirin EC 81 MG tablet Take 81 mg by mouth at bedtime.     clopidogrel (PLAVIX) 75 MG tablet Take 1 tablet (75 mg total) by mouth at bedtime. Qty: 90 tablet, Refills: 1    fenofibrate 160 MG tablet Take 1 tablet (160 mg total) by mouth at bedtime. Qty:  30 tablet, Refills: 5    hydrALAZINE (APRESOLINE) 50 MG tablet Take 1 tablet (50 mg total) by mouth 3 (three) times daily. Qty: 90 tablet, Refills: 0    insulin lispro (HUMALOG KWIKPEN) 100 UNIT/ML KiwkPen 5 units are breakfast. 5 units at lunch. 7 units at supper Qty: 15 mL, Refills: 3    LANTUS SOLOSTAR 100 UNIT/ML Solostar Pen Inject 80 Units into the skin at bedtime.     losartan (COZAAR) 50 MG tablet Take 1 tablet (50 mg  total) by mouth daily.    metoprolol tartrate (LOPRESSOR) 25 MG tablet Take 1 tablet (25 mg total) by mouth 2 (two) times daily. Qty: 60 tablet, Refills: 3    torsemide (DEMADEX) 20 MG tablet Take 2 tablets in the morning and 1 tablet in the afternoon. Qty: 90 tablet, Refills: 5    nitroGLYCERIN (NITROSTAT) 0.4 MG SL tablet Place 0.4 mg under the tongue every 5 (five) minutes as needed for chest pain.      STOP taking these medications     rosuvastatin (CRESTOR) 20 MG tablet        Allergies  Allergen Reactions  . Hydrocodone Nausea And Vomiting  . Lisinopril Cough  . Neurontin [Gabapentin] Other (See Comments)    Suicidal thoughts  . Statins Other (See Comments)    Muscle aches  . Metformin And Related Diarrhea and Other (See Comments)    Intestinal side effects  . Norvasc [Amlodipine Besylate] Swelling    Pedal edema    The results of significant diagnostics from this hospitalization (including imaging, microbiology, ancillary and laboratory) are listed below for reference.    Significant Diagnostic Studies: Dg Scapula Right  01/04/2015   CLINICAL DATA:  Right posterior shoulder pain after a fall.  EXAM: RIGHT SCAPULA - 2+ VIEWS  COMPARISON:  01/04/2015 right shoulder.  FINDINGS: There is no evidence of fracture or other focal bone lesions. Soft tissues are unremarkable.  IMPRESSION: Negative.   Electronically Signed   By: Lucienne Capers M.D.   On: 01/04/2015 06:35   Dg Shoulder Right  01/04/2015   CLINICAL DATA:  Right shoulder pain after a fall yesterday.  EXAM: RIGHT SHOULDER - 2+ VIEW  COMPARISON:  None.  FINDINGS: There is no evidence of fracture or dislocation. There is no evidence of arthropathy or other focal bone abnormality. Soft tissues are unremarkable.  IMPRESSION: Negative.   Electronically Signed   By: Lucienne Capers M.D.   On: 01/04/2015 04:17   Dg Elbow Complete Right  01/20/2015   CLINICAL DATA:  Right elbow pain since this morning. Limited range of  motion. Questionable arm injury.  EXAM: RIGHT ELBOW - COMPLETE 3+ VIEW  COMPARISON:  None.  FINDINGS: There is a joint effusion, with uplifting of the ventral fat pad. No fracture or malalignment. Enthesopathic spurs to the coronoid and olecranon. No erosive changes. Bone island in the capitellum.  IMPRESSION: Joint effusion without acute bony abnormality. If there was trauma, occult fracture is primary concern. If no trauma, consider inflammatory arthritis.   Electronically Signed   By: Monte Fantasia M.D.   On: 01/20/2015 23:10   Dg Chest Port 1 View  12/25/2014   CLINICAL DATA:  Chest pain, shortness of breath and lightheadedness for 1 hour. History of COPD, hypertension, diabetes, CHF, MI.  EXAM: PORTABLE CHEST - 1 VIEW  COMPARISON:  08/28/2014  FINDINGS: The heart is enlarged. Lungs are clear. No pulmonary edema. Mid thoracic spondylosis noted.  IMPRESSION: Cardiomegaly.   Electronically Signed  By: Nolon Nations M.D.   On: 12/25/2014 14:12    Microbiology: Recent Results (from the past 240 hour(s))  Body fluid culture     Status: None (Preliminary result)   Collection Time: 01/21/15 12:40 AM  Result Value Ref Range Status   Specimen Description SYNOVIAL  Final   Special Requests NONE  Final   Gram Stain   Final    WBC PRESENT,BOTH PMN AND MONONUCLEAR NO ORGANISMS SEEN CYTOSPIN Performed at [facility]    Culture   Final    NO GROWTH 1 DAY Performed at [facility]    Report Status PENDING  Incomplete     Labs: Basic Metabolic Panel:  Recent Labs Lab 01/18/15 1018 01/21/15 0018 01/22/15 0456  NA 142 135 136  K 4.9 4.5 4.5  CL 103 103 103  CO2 25 24 24   GLUCOSE 144* 335* 207*  BUN 26* 35* 37*  CREATININE 1.37* 1.78* 1.98*  CALCIUM 8.9 8.7* 8.7*   Liver Function Tests:  Recent Labs Lab 01/18/15 1018 01/22/15 0456  AST 20 17  ALT 16 14*  ALKPHOS 56 45  BILITOT 0.3 0.7  PROT 6.1 6.7  ALBUMIN  --  3.3*   CBC:  Recent Labs Lab  01/21/15 0018 01/22/15 0456  WBC 9.0 7.5  NEUTROABS 6.0  --   HGB 11.0* 10.0*  HCT 33.0* 30.7*  MCV 88.2 89.2  PLT 193 197    CBG:  Recent Labs Lab 01/21/15 1147 01/21/15 1724 01/21/15 2159 01/22/15 0751 01/22/15 1153  GLUCAP 175* 248* 213* 178* 270*    Principal Problem:   Elbow joint pain Active Problems:   DM type 2 (diabetes mellitus, type 2)   Type 2 diabetes mellitus with neurological manifestations   Chronic renal insufficiency, stage III (moderate)   Time coordinating discharge: 35 minutes  Signed:  Murray Hodgkins, MD [facility] 01/22/2015, 4:45 PM

## 2015-01-22 NOTE — Progress Notes (Signed)
  PROGRESS NOTE  Eugene [name] [place] [id number] DOB: 1956/02/19 DOA: 01/20/2015 PCP: [name], MD  Summary: 59 year old man presented with severe right elbow pain and swelling of the joint. Arthrocentesis was performed in the emergency department the patient was admitted for further evaluation to exclude septic arthritis.  Assessment/Plan: 1. Elbow joint pain with effusion. Discussed with Dr. Aline Brochure today. He recommended treatment with prednisone, sling, ice, follow-up June 1 with him. His clinical impression was inflammatory arthritis/olecranon bursitis. 2. Diabetes mellitus with peripheral neuropathy 3. CKD stage III, stable. 4. COPD   Home today as per Dr. Aline Brochure with Prednisone and close outpt follow-up.  Murray Hodgkins, MD  [facility]  Pager [phone] If 7PM-7AM, please contact night-coverage at [email or web address], password [facility] 01/22/2015, 4:23 PM  LOS: 1 day   Consultants:  Orthopedics  Procedures:  5/26 right elbow arthrocentesis  Antibiotics:    HPI/Subjective: Feeling better, less elbow pain. Wants to go home.  Objective: Filed Vitals:   01/21/15 2100 01/22/15 0500 01/22/15 0535 01/22/15 1600  BP: 174/80 126/64  167/73  Pulse: 87 62  88  Temp: 98.4 F (36.9 C) 98.4 F (36.9 C)  98.4 F (36.9 C)  TempSrc: Oral Oral  Oral  Resp: 20 20  19   Height:      Weight:   119.523 kg (263 lb 8 oz)   SpO2: 96% 98%  99%    Intake/Output Summary (Last 24 hours) at 01/22/15 1623 Last data filed at 01/22/15 1610  Gross per 24 hour  Intake    960 ml  Output   1250 ml  Net   -290 ml     Filed Weights   01/21/15 0502 01/22/15 0535  Weight: 120.203 kg (265 lb) 119.523 kg (263 lb 8 oz)    Exam:     Afebrile, vital signs stable. No hypoxia. General:  Appears calm and comfortable Cardiovascular: RRR, no m/r/g.  Respiratory: CTA bilaterally, no w/r/r. Normal respiratory effort. Musculoskeletal: grossly normal tone BUE/BLE. Mild right elbow  swelling. No erythema. Right hand unremarkable, right hand 2+ radial pulse, warm Psychiatric: grossly normal mood and affect, speech fluent and appropriate  New data reviewed:  Basic metabolic panel consistent with chronic kidney disease.  CBC stable  Pertinent data since admission:  Joint fluid with no crystals.  Pending data:  Body fluid culture, no growth today  Scheduled Meds: . aspirin EC  81 mg Oral QHS  . clopidogrel  75 mg Oral QHS  . colchicine  0.6 mg Oral Daily  . febuxostat  40 mg Oral Daily  . fenofibrate  160 mg Oral QHS  . hydrALAZINE  50 mg Oral TID  . insulin aspart  0-15 Units Subcutaneous TID WC  . insulin aspart  0-5 Units Subcutaneous QHS  . insulin glargine  40 Units Subcutaneous QHS  . metoprolol tartrate  25 mg Oral BID  . predniSONE  50 mg Oral Q breakfast  . rosuvastatin  20 mg Oral q1800   Continuous Infusions:   Principal Problem:   Elbow joint pain Active Problems:   DM type 2 (diabetes mellitus, type 2)   Type 2 diabetes mellitus with neurological manifestations   Chronic renal insufficiency, stage III (moderate)

## 2015-01-24 LAB — BODY FLUID CULTURE: CULTURE: NO GROWTH

## 2015-01-26 ENCOUNTER — Ambulatory Visit: Payer: Commercial Managed Care - HMO | Admitting: Orthopedic Surgery

## 2015-01-27 ENCOUNTER — Encounter: Payer: Self-pay | Admitting: Orthopedic Surgery

## 2015-01-27 ENCOUNTER — Ambulatory Visit: Payer: Commercial Managed Care - HMO | Admitting: Orthopedic Surgery

## 2015-03-07 ENCOUNTER — Telehealth: Payer: Self-pay | Admitting: Family Medicine

## 2015-03-07 ENCOUNTER — Other Ambulatory Visit: Payer: Self-pay | Admitting: *Deleted

## 2015-03-07 MED ORDER — INSULIN GLARGINE 100 UNIT/ML SOLOSTAR PEN: 90.0000 [IU] | PEN_INJECTOR | Freq: Every day | SUBCUTANEOUS | Status: DC

## 2015-03-07 NOTE — Telephone Encounter (Signed)
LMRC

## 2015-03-07 NOTE — Telephone Encounter (Signed)
LANTUS SOLOSTAR 100 UNIT/ML Solostar Pen  Pt states he needs this refilled today, he is out as of last night Unsure if his Pharmacy has already called it in.

## 2015-03-07 NOTE — Patient Outreach (Signed)
[name]) Care Management  03/07/2015  [place] 1956/05/10 [id number]   Referral from [facility] Tier 4 list, assigned to Sherrin Daisy, RN for patient outreach.  Eugene [name] [facility] Care Management Assistant [phone]

## 2015-03-07 NOTE — Telephone Encounter (Signed)
Med sent to pharm. Pt notified.  

## 2015-03-07 NOTE — Telephone Encounter (Signed)
Please call in this medication +4 additional refills patient should follow-up at least by early fall

## 2015-03-07 NOTE — Telephone Encounter (Signed)
Called pt to clarify because lantus is on med list but it does not show where we have refilled it since last fall. Pt states he has been using his brother in laws lantus so he does not go into his donut hole. He is completely out he takes 80 - 90 units at night

## 2015-03-24 ENCOUNTER — Other Ambulatory Visit: Payer: Self-pay | Admitting: Family Medicine

## 2015-03-24 NOTE — Telephone Encounter (Signed)
May do this and 3 refills

## 2015-03-28 ENCOUNTER — Other Ambulatory Visit: Payer: Self-pay | Admitting: Family Medicine

## 2015-03-28 MED ORDER — LOSARTAN POTASSIUM 100 MG PO TABS: 100.0000 mg | ORAL_TABLET | Freq: Every day | ORAL | Status: DC

## 2015-03-28 NOTE — Telephone Encounter (Signed)
Rx sent electronically to pharmacy. Patient notified. 

## 2015-03-28 NOTE — Telephone Encounter (Signed)
losartan (COZAAR) 50 MG tablet  Pt states he needs a refill on this sent to [facility] please

## 2015-03-29 ENCOUNTER — Other Ambulatory Visit: Payer: Self-pay | Admitting: *Deleted

## 2015-03-29 DIAGNOSIS — I5033 Acute on chronic diastolic (congestive) heart failure: Secondary | ICD-10-CM

## 2015-03-29 DIAGNOSIS — E118 Type 2 diabetes mellitus with unspecified complications: Secondary | ICD-10-CM

## 2015-03-29 NOTE — Patient Outreach (Signed)
[facility]) Care Management  03/29/2015  [place] 1956-02-15 [id number]   [place] Tier 4 referral:   Telephone call to patient; HIPPA verification received. Patient voices that major health conditions are DM and Heart failure. Currently seeing primary care provider every 3 months and cardiologist as directed.   Patient states last hemoglobin  A1c level was 8.6 and target is 7.  States he has scales but is not weighing daily. States he is able to recognize heart failure symptoms and knows action plan.   States he uses insurance benefits and is enrolled in exercise classes. States he has no trouble getting prescriptions filled. Drives himself to doctors appointments.  Patient advised of [facility] disease management program with communication with Health Coaches telephonically . Patient   consents to participation in program due to diabetes blood work is high.   Plan: will refer to [facility] ; patient has insulin dependent diabetes and heart failure. Referral sent to [facility]. Telephonic care coordinator signing off.   Sherrin Daisy, RN BSN [place] Management Coordinator [facility] Care Management  [phone]

## 2015-03-30 NOTE — Patient Outreach (Signed)
[facility]) Care Management  03/30/2015  [place] [birth year] [id number]   Notification from Sherrin Daisy, RN to assign RN Health Coach, Jon Billings, RN assigned.  Ronnell [place]. [place], [place] Management [place] Assistant Phone: [phone] Fax: [phone]

## 2015-03-31 ENCOUNTER — Other Ambulatory Visit: Payer: Self-pay

## 2015-03-31 ENCOUNTER — Other Ambulatory Visit: Payer: Self-pay | Admitting: Family Medicine

## 2015-03-31 NOTE — Patient Outreach (Signed)
[place] [facility]) Care Management  03/31/2015  [place] 1955/12/07 [id number]   Telephone call to patient for initial health coach outreach.  No answer. HIPAA compliant voice mail left.    Plan: RN Health Coach will contact patient within one week.  Jone Baseman, RN, MSN [place] [phone]

## 2015-04-06 ENCOUNTER — Encounter: Payer: Self-pay | Admitting: Family Medicine

## 2015-04-06 ENCOUNTER — Ambulatory Visit (INDEPENDENT_AMBULATORY_CARE_PROVIDER_SITE_OTHER): Payer: Commercial Managed Care - HMO | Admitting: Family Medicine

## 2015-04-06 VITALS — BP 122/78 | Ht 73.0 in | Wt 266.2 lb

## 2015-04-06 DIAGNOSIS — E781 Pure hyperglyceridemia: Secondary | ICD-10-CM

## 2015-04-06 DIAGNOSIS — E785 Hyperlipidemia, unspecified: Secondary | ICD-10-CM

## 2015-04-06 DIAGNOSIS — E119 Type 2 diabetes mellitus without complications: Secondary | ICD-10-CM

## 2015-04-06 DIAGNOSIS — N289 Disorder of kidney and ureter, unspecified: Secondary | ICD-10-CM

## 2015-04-06 LAB — POCT GLYCOSYLATED HEMOGLOBIN (HGB A1C): HEMOGLOBIN A1C: 7.1

## 2015-04-06 MED ORDER — METOPROLOL TARTRATE 25 MG PO TABS: 25.0000 mg | ORAL_TABLET | Freq: Two times a day (BID) | ORAL | Status: DC

## 2015-04-06 MED ORDER — LOSARTAN POTASSIUM 100 MG PO TABS: 100.0000 mg | ORAL_TABLET | Freq: Every day | ORAL | Status: DC

## 2015-04-06 NOTE — Progress Notes (Signed)
   Subjective:    Patient ID: Eugene [name], male    DOB: 05-14-1956, 59 y.o.   MRN: [id number]  Diabetes He presents for his follow-up diabetic visit. He has type 2 diabetes mellitus. Pertinent negatives for hypoglycemia include no confusion. Pertinent negatives for diabetes include no chest pain, no fatigue, no polydipsia, no polyphagia and no weakness. Risk factors for coronary artery disease include diabetes mellitus, dyslipidemia, hypertension and obesity. Current diabetic treatment includes insulin injections. He is compliant with treatment all of the time.    He denies PND denies any shortness of breath other than what he would expect he denies any chest pressure tightness or pain he is doing a better job watching his diet is taking his medicines he has cut back on starches his energy level overall is improving  Review of Systems  Constitutional: Negative for activity change, appetite change and fatigue.  HENT: Negative for congestion.   Respiratory: Negative for cough.   Cardiovascular: Negative for chest pain.  Gastrointestinal: Negative for abdominal pain.  Endocrine: Negative for polydipsia and polyphagia.  Neurological: Negative for weakness.  Psychiatric/Behavioral: Negative for confusion.       Objective:   Physical Exam  Constitutional: He appears well-nourished. No distress.  Cardiovascular: Normal rate, regular rhythm and normal heart sounds.   No murmur heard. Pulmonary/Chest: Effort normal and breath sounds normal. No respiratory distress.  Musculoskeletal: He exhibits no edema.  Lymphadenopathy:    He has no cervical adenopathy.  Neurological: He is alert.  Psychiatric: His behavior is normal.  Vitals reviewed.         Assessment & Plan:  1. Diabetes mellitus without complication [id number] looks much better than it has been he is exercising watching diet losing weight he's doing everything could be asked of him. I'm very pleased with how he is doing  follow-up again in 3-4 months patient does have some renal insufficiency we will recheck a metabolic 7 level in the future. - POCT glycosylated hemoglobin (Hb 123XX123) - Basic metabolic panel  2. Renal insufficiency See above  3. Hyperlipemia Patient with history hyperlipidemia continue current medications watch diet closely patient will do more comprehensive lab work 3-4 months  4. Hypertriglyceridemia Patient was encouraged to watch starches in the diet we will check a lipid profile await the results - Lipid panel  History of heart disease cardiomyopathy and hypertension blood pressure under good control no signs of CHF currently see patient back 3-4 months

## 2015-04-07 ENCOUNTER — Other Ambulatory Visit: Payer: Self-pay

## 2015-04-07 NOTE — Patient Outreach (Signed)
[facility]) Care Management  [place]  04/07/2015   [place] Jan 11, 1956 [id number]  Telephone call to patient for initial health coach call. Patient receptive to call.  He states he is doing good.  He reports seeing PCP on 04-06-15.  He states A1c is down to 7.1 from 8.6.   Patient reports he is watching his diet and exercising 3-4 times a week at the [facility].  Patient has lost about 17 lbs recently.  Patient very happy about his accomplishments and knows that he needs to change his eating habits and exercise to survive.  Patient weighing about 3-4 days a week.  Patient not aware of heart failure zones. Discussed heart failure chart and purpose.  He verbalized understanding.  Patient knows to notify physician is he gains 5 or more pounds within 1 week.  Patient states he wears support hose to help with his legs and swelling.  Discussed with patient low sodium diet.  Patient reports he tries to watch his sodium.  Patient also reports he has COPD and that he worked in [employer] for 31 years.  He denies ever smoking. Patient voices no concerns.      Current Medications:  Current Outpatient Prescriptions  Medication Sig Dispense Refill  . albuterol (PROVENTIL HFA;VENTOLIN HFA) 108 (90 BASE) MCG/ACT inhaler Inhale 2 puffs into the lungs every 4 (four) hours as needed for shortness of breath. 1 Inhaler 2  . albuterol (PROVENTIL) (2.5 MG/3ML) 0.083% nebulizer solution Inhale 3 mLs into the lungs every 6 (six) hours as needed.    . ALPRAZolam (XANAX) 0.5 MG tablet TAKE 1/2-1 TABLET BY MOUTH AT BEDTIME AS NEEDED 30 tablet 3  . aspirin EC 81 MG tablet Take 81 mg by mouth at bedtime.     . clopidogrel (PLAVIX) 75 MG tablet TAKE 1 TABLET AT BEDTIME 90 tablet 1  . fenofibrate 160 MG tablet Take 1 tablet (160 mg total) by mouth at bedtime. 30 tablet 5  . hydrALAZINE (APRESOLINE) 50 MG tablet Take 1 tablet (50 mg total) by mouth 3 (three) times daily. 90 tablet 0  . Insulin  Glargine (LANTUS SOLOSTAR) 100 UNIT/ML Solostar Pen Inject 90 Units into the skin at bedtime. 15 mL 4  . insulin lispro (HUMALOG KWIKPEN) 100 UNIT/ML KiwkPen 5 units are breakfast. 5 units at lunch. 7 units at supper 15 mL 3  . losartan (COZAAR) 100 MG tablet Take 1 tablet (100 mg total) by mouth daily. 90 tablet 3  . metoprolol tartrate (LOPRESSOR) 25 MG tablet Take 1 tablet (25 mg total) by mouth 2 (two) times daily. 180 tablet 3  . nitroGLYCERIN (NITROSTAT) 0.4 MG SL tablet Place 0.4 mg under the tongue every 5 (five) minutes as needed for chest pain.    [name] torsemide (DEMADEX) 20 MG tablet Take 2 tablets in the morning and 1 tablet in the afternoon. 90 tablet 5   No current facility-administered medications for this visit.    Functional Status:  In your present state of health, do you have any difficulty performing the following activities: 04/07/2015 01/21/2015  Hearing? N N  Vision? N N  Difficulty concentrating or making decisions? N N  Walking or climbing stairs? N N  Dressing or bathing? N N  Doing errands, shopping? N N  Preparing Food and eating ? N -  Using the Toilet? N -  In the past six months, have you accidently leaked urine? N -  Do you have problems with loss of  bowel control? N -  Managing your Medications? N -  Managing your Finances? N -  Housekeeping or managing your Housekeeping? N -    Fall/Depression Screening: PHQ 2/9 Scores 04/07/2015 10/14/2012  PHQ - 2 Score 0 1   THN CM Care Plan Problem One        Patient Outreach Telephone from 04/07/2015 in [place] Problem One  Heart failure knowledge deficit   Care Plan for Problem One  Active   THN Long Term Goal (31-90 days)  Patient will be able to identify heart failure zones within 90 days.    THN Long Term Goal Start Date  04/07/15   Interventions for Problem One Long Term Goal  Discussed heart failure zone chart and purpose.    THN CM Short Term Goal #1 (0-30 days)  Patient will be  able to verbalize symptoms of heart failure green zone within 30 days.   THN CM Short Term Goal #1 Start Date  04/07/15   Interventions for Short Term Goal #1  Explained to patient green zone of heart failure zone chart.    THN CM Short Term Goal #2 (0-30 days)  Patient will be able to name three foods that are high in sodium within 30 days.   THN CM Short Term Goal #2 Start Date  04/07/15   Interventions for Short Term Goal #2  Discussed with patient how frozen/fresh/and canned foods are different in sodium content.       Assessment: Patient has some knowledge deficit about heart failure.  Patient will benefit from health coach for disease management.  Plan:  RN Health Coach will provide ongoing education for patient on heart failure through phone calls and sending printed information to patient for further discussion. RN Health Coach will send welcome packet with consent to patient as well as printed information on health failure. RN Health Coach will send initial barriers letter, assessment, and care plan to primary care physician. RN Health Coach will contact patient within one month and patient agrees to next contact.   Jone Baseman, RN, MSN [place] [phone]

## 2015-04-13 ENCOUNTER — Ambulatory Visit (INDEPENDENT_AMBULATORY_CARE_PROVIDER_SITE_OTHER): Payer: Commercial Managed Care - HMO | Admitting: Cardiovascular Disease

## 2015-04-13 ENCOUNTER — Encounter: Payer: Self-pay | Admitting: Cardiovascular Disease

## 2015-04-13 VITALS — BP 170/100 | HR 79 | Ht 73.0 in | Wt 261.0 lb

## 2015-04-13 DIAGNOSIS — I319 Disease of pericardium, unspecified: Secondary | ICD-10-CM

## 2015-04-13 DIAGNOSIS — I5032 Chronic diastolic (congestive) heart failure: Secondary | ICD-10-CM | POA: Diagnosis not present

## 2015-04-13 DIAGNOSIS — R600 Localized edema: Secondary | ICD-10-CM | POA: Diagnosis not present

## 2015-04-13 DIAGNOSIS — I2510 Atherosclerotic heart disease of native coronary artery without angina pectoris: Secondary | ICD-10-CM

## 2015-04-13 DIAGNOSIS — E782 Mixed hyperlipidemia: Secondary | ICD-10-CM

## 2015-04-13 DIAGNOSIS — I10 Essential (primary) hypertension: Secondary | ICD-10-CM

## 2015-04-13 DIAGNOSIS — I313 Pericardial effusion (noninflammatory): Secondary | ICD-10-CM

## 2015-04-13 DIAGNOSIS — I3139 Other pericardial effusion (noninflammatory): Secondary | ICD-10-CM

## 2015-04-13 DIAGNOSIS — Z955 Presence of coronary angioplasty implant and graft: Secondary | ICD-10-CM

## 2015-04-13 NOTE — Progress Notes (Signed)
Patient ID: Eugene [name], male   DOB: Nov 04, 1955, 59 y.o.   MRN: [id number]      SUBJECTIVE: The patient presents for routine follow up for multiple cardiovascular issues. Hospitalized for hypertensive urgency and acute on chronic diastolic heart failure in 12/2014.  Echocardiogram on 12/26/14 showed normal LV systolic function, EF 0000000, moderate LVH, grade 1 diastolic dysfunction, elevated filling pressures, mild biatrial dilatation, and a small pericardial effusion.  Nuclear stress testing on 12/14/2013 showed myocardial scar in the anterior, inferior, and septal walls with no evidence of inducible ischemia.  He has been doing very well and denies chest pain. He says significantly altered his lifestyle. He now walks 1 mile daily at the [facility]. He participates in the [employer] program. His HbA1c has gone down to 7.1% from 8.6%. He used to drink 4-5 gallons of milk per week but has since quit this. He has lost at least 17 pounds and plans to lose 20 more. He jogs in the water as well. He checks his blood pressure 4 times per week and it normally runs in the 127/70 range. It was checked at his PCPs office recently and was 128/71.     Review of Systems: As per "subjective", otherwise negative.  Allergies  Allergen Reactions  . Hydrocodone Nausea And Vomiting  . Lisinopril Cough  . Neurontin [Gabapentin] Other (See Comments)    Suicidal thoughts  . Statins Other (See Comments)    Muscle aches  . Metformin And Related Diarrhea and Other (See Comments)    Intestinal side effects  . Norvasc [Amlodipine Besylate] Swelling    Pedal edema    Current Outpatient Prescriptions  Medication Sig Dispense Refill  . albuterol (PROVENTIL HFA;VENTOLIN HFA) 108 (90 BASE) MCG/ACT inhaler Inhale 2 puffs into the lungs every 4 (four) hours as needed for shortness of breath. 1 Inhaler 2  . albuterol (PROVENTIL) (2.5 MG/3ML) 0.083% nebulizer solution Inhale 3 mLs into the lungs every 6 (six) hours  as needed.    . ALPRAZolam (XANAX) 0.5 MG tablet TAKE 1/2-1 TABLET BY MOUTH AT BEDTIME AS NEEDED 30 tablet 3  . aspirin EC 81 MG tablet Take 81 mg by mouth at bedtime.     . clopidogrel (PLAVIX) 75 MG tablet TAKE 1 TABLET AT BEDTIME 90 tablet 1  . fenofibrate 160 MG tablet Take 1 tablet (160 mg total) by mouth at bedtime. 30 tablet 5  . hydrALAZINE (APRESOLINE) 50 MG tablet Take 1 tablet (50 mg total) by mouth 3 (three) times daily. 90 tablet 0  . Insulin Glargine (LANTUS SOLOSTAR) 100 UNIT/ML Solostar Pen Inject 90 Units into the skin at bedtime. 15 mL 4  . insulin lispro (HUMALOG KWIKPEN) 100 UNIT/ML KiwkPen 5 units are breakfast. 5 units at lunch. 7 units at supper 15 mL 3  . losartan (COZAAR) 100 MG tablet Take 1 tablet (100 mg total) by mouth daily. 90 tablet 3  . metoprolol tartrate (LOPRESSOR) 25 MG tablet Take 1 tablet (25 mg total) by mouth 2 (two) times daily. 180 tablet 3  . nitroGLYCERIN (NITROSTAT) 0.4 MG SL tablet Place 0.4 mg under the tongue every 5 (five) minutes as needed for chest pain.    [name] torsemide (DEMADEX) 20 MG tablet Take 2 tablets in the morning and 1 tablet in the afternoon. 90 tablet 5   No current facility-administered medications for this visit.    Past Medical History  Diagnosis Date  . Arteriosclerotic cardiovascular disease (ASCVD)     a. 05/2011 Cath/PCI:  LM nl, LAD 18m, D1 small, D2 small 33m, LCX large 40p, RCA 50-60p, 99 hazy @ origin of PDA with 70-80 in PDA (2.5x26 Resolute Integrity & 3.0x15 Resolute Integrity DES).;  b. 08/2012 Inflat  STEMI/Cath/PCI: LM minor irregs, LAD 50p, D1 50, LCX nl, OM1 25, RCA 30-40p, 100d (treated with 2.75x60mm Promus Premier DES);  c. 08/2012 Echo: EF 55-60%, basal inferopost HK.  [name] Hyperlipidemia   . Diabetes mellitus     Peripheral neuropathy  . Bell palsy   . Hypertension   . COPD (chronic obstructive pulmonary disease)   . Sleep apnea   . Gallstones   . Cholelithiasis 07/2012    Asymptomatic; identified  incidentally  . PONV (postoperative nausea and vomiting)   . Anxiety   . C. difficile colitis     a. 08/2012  . Myocardial infarct 09/08/12  . Nephrolithiasis   . Contrast dye induced nephropathy     a. 08/2012 post cath/pci  . CHF (congestive heart failure)   . Asthma   . Heart disease   . Old myocardial infarction   . Anginal pain   . GERD (gastroesophageal reflux disease)     Past Surgical History  Procedure Laterality Date  . Circumcision    . Stents    . Esophagogastroduodenoscopy      in [place] over 20 yrs ago  . Colonoscopy      In [facility], approximately 2011 per patient, was normal. Advised to come back in 10 years.  . Esophagogastroduodenoscopy  06/12/2012    [id number] esophagus-status post Venia Minks dilation. Abnormal gastric mucosa of uncertain significance-status post biopsy  . Cardiac catheterization    . Subxyphoid pericardial window N/A 06/23/2013    Procedure: SUBXYPHOID PERICARDIAL WINDOW;  Surgeon: Grace Isaac, MD;  Location: [place];  Service: Thoracic;  Laterality: N/A;  . Intraoperative transesophageal echocardiogram N/A 06/23/2013    Procedure: INTRAOPERATIVE TRANSESOPHAGEAL ECHOCARDIOGRAM;  Surgeon: Grace Isaac, MD;  Location: [place];  Service: Open Heart Surgery;  Laterality: N/A;  . Left heart catheterization with coronary angiogram N/A 09/08/2012    Procedure: LEFT HEART CATHETERIZATION WITH CORONARY ANGIOGRAM;  Surgeon: [name], MD;  Location: [facility] C[facility] LAB;  Service: Cardiovascular;  Laterality: N/A;  . Chest tube insertion      Social History   Social History  . Marital Status: Married    Spouse Name: N/A  . Number of Children: 2  . Years of Education: N/A   Occupational History  . Shipping     [employer]   Social History Main Topics  . Smoking status: Never Smoker   . Smokeless tobacco: Never Used  . Alcohol Use: No     Comment: heavy etoh use 30 years ago  . Drug Use: No  . Sexual Activity: Yes     Birth Control/ Protection: None   Other Topics Concern  . 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 Disabled at this point.     Filed Vitals:   04/13/15 1410  BP: 170/100  Pulse: 79  Height: 6\' 1"  (1.854 m)  Weight: 261 lb (118.389 kg)  SpO2: 95%    PHYSICAL EXAM General: NAD HEENT: Normal. Neck: No JVD, no thyromegaly. Lungs: Clear to auscultation bilaterally with normal respiratory effort. CV: Nondisplaced PMI. Regular rate and rhythm, normal S1/S2, no S3/S4, no murmur. Trace pretibial and periankle edema. Wearing compression stockings. Abdomen: Firm, obese, nontender.  Neurologic: Alert and  oriented x 3.  Psych: Normal affect. Skin: Normal. Musculoskeletal: Normal range of motion, no gross deformities. Extremities: No clubbing or cyanosis.   ECG: Most recent ECG reviewed.      ASSESSMENT AND PLAN: 1. CAD s/p multiple PCI's to RCA: Stable ischemic heart disease. Continue aspirin, metoprolol, and Plavix. Not amenable to statin therapy. He would prefer to hold off on Zetia and wants to continue to pursue therapeutic lifestyle changes.   2. Essential HTN: Elevated in office but routinely controlled at home. Has been taking hydralazine 50 mg tid along with losartan 100 mg daily. Will monitor.  3. Chronic diastolic heart failure: Euvolemic. Continue low-sodium and fluid-restricted diet. BP controlled at home. Will give prescription for knee-high compression stockings per his request (20-30 mmHg).  4. Mixed dyslipidemia: Markedly elevated lipids on 01/18/15 in context of diabetes and CAD. Intolerant to statins (muscle aches). On fenofibrate. He would prefer to hold off on Zetia and wants to continue to pursue therapeutic lifestyle changes. Due for repeat lipids ordered by PCP.  5. Pericardial effusion s/p pericardial window: Small  in size and stable in 12/2014. Continue to monitor.   Dispo: f/u 6 months.  Kate Sable, M.D., F.A.C.C.

## 2015-04-13 NOTE — Patient Instructions (Signed)
   Order for knee high compression stockings provided today. Continue all current medications. Your physician wants you to follow up in: 6 months.  You will receive a reminder letter in the mail one-two months in advance.  If you don't receive a letter, please call our office to schedule the follow up appointment

## 2015-04-25 ENCOUNTER — Other Ambulatory Visit: Payer: Self-pay | Admitting: Family Medicine

## 2015-04-26 DIAGNOSIS — H524 Presbyopia: Secondary | ICD-10-CM | POA: Diagnosis not present

## 2015-04-26 DIAGNOSIS — H5210 Myopia, unspecified eye: Secondary | ICD-10-CM | POA: Diagnosis not present

## 2015-04-26 DIAGNOSIS — H3562 Retinal hemorrhage, left eye: Secondary | ICD-10-CM | POA: Diagnosis not present

## 2015-04-27 ENCOUNTER — Encounter (INDEPENDENT_AMBULATORY_CARE_PROVIDER_SITE_OTHER): Payer: Commercial Managed Care - HMO | Admitting: Ophthalmology

## 2015-04-27 DIAGNOSIS — H43813 Vitreous degeneration, bilateral: Secondary | ICD-10-CM | POA: Diagnosis not present

## 2015-04-27 DIAGNOSIS — I10 Essential (primary) hypertension: Secondary | ICD-10-CM | POA: Diagnosis not present

## 2015-04-27 DIAGNOSIS — E11351 Type 2 diabetes mellitus with proliferative diabetic retinopathy with macular edema: Secondary | ICD-10-CM | POA: Diagnosis not present

## 2015-04-27 DIAGNOSIS — E11311 Type 2 diabetes mellitus with unspecified diabetic retinopathy with macular edema: Secondary | ICD-10-CM | POA: Diagnosis not present

## 2015-04-27 DIAGNOSIS — H35033 Hypertensive retinopathy, bilateral: Secondary | ICD-10-CM

## 2015-05-03 ENCOUNTER — Other Ambulatory Visit: Payer: Self-pay

## 2015-05-03 DIAGNOSIS — I509 Heart failure, unspecified: Secondary | ICD-10-CM

## 2015-05-03 DIAGNOSIS — E118 Type 2 diabetes mellitus with unspecified complications: Secondary | ICD-10-CM

## 2015-05-03 NOTE — Patient Outreach (Signed)
[place] [facility]) Care Management  05/03/2015  [place] [birth year] [id number]   Request from Jon Billings, RN to assign Pharmacy, assigned Deanne Coffer, PharmD.  Thanks, Ronnell Freshwater. [place], [place] Assistant Phone: [phone] Fax: [phone]

## 2015-05-03 NOTE — Patient Outreach (Signed)
[place] [facility]) Care Management  [place]  05/03/2015   [place] 1955/10/10 [id number]   Returned phone call to patient. He reports that he is in the donut hole right now for his insulin.  Discussed with patient pharmacy referral for help with the donut hole. Patient in agreement.    Heart Failure: Patient reports his weight has been stable. He reports he continues to exercise at the [facility] 3 times a week.  Discussed heart failure zone chart with patient.  No concerns.    Current Medications:  Current Outpatient Prescriptions  Medication Sig Dispense Refill  . albuterol (PROVENTIL HFA;VENTOLIN HFA) 108 (90 BASE) MCG/ACT inhaler Inhale 2 puffs into the lungs every 4 (four) hours as needed for shortness of breath. 1 Inhaler 2  . albuterol (PROVENTIL) (2.5 MG/3ML) 0.083% nebulizer solution Inhale 3 mLs into the lungs every 6 (six) hours as needed.    . ALPRAZolam (XANAX) 0.5 MG tablet TAKE 1/2-1 TABLET BY MOUTH AT BEDTIME AS NEEDED 30 tablet 3  . aspirin EC 81 MG tablet Take 81 mg by mouth at bedtime.     . clopidogrel (PLAVIX) 75 MG tablet TAKE 1 TABLET AT BEDTIME 90 tablet 1  . fenofibrate 160 MG tablet Take 1 tablet (160 mg total) by mouth at bedtime. 30 tablet 5  . hydrALAZINE (APRESOLINE) 50 MG tablet Take 1 tablet (50 mg total) by mouth 3 (three) times daily. 90 tablet 0  . Insulin Glargine (LANTUS SOLOSTAR) 100 UNIT/ML Solostar Pen Inject 90 Units into the skin at bedtime. 15 mL 4  . insulin lispro (HUMALOG KWIKPEN) 100 UNIT/ML KiwkPen 5 units are breakfast. 5 units at lunch. 7 units at supper 15 mL 3  . losartan (COZAAR) 100 MG tablet Take 1 tablet (100 mg total) by mouth daily. 90 tablet 3  . metoprolol tartrate (LOPRESSOR) 25 MG tablet Take 1 tablet (25 mg total) by mouth 2 (two) times daily. 180 tablet 3  . nitroGLYCERIN (NITROSTAT) 0.4 MG SL tablet Place 0.4 mg under the tongue every 5 (five) minutes as needed for chest pain.    [name] torsemide (DEMADEX)  20 MG tablet TAKE 1 TABLET EVERY MORNING  AND TAKE 1/2 TABLET EVERY EVENING 135 tablet 1   No current facility-administered medications for this visit.    Functional Status:  In your present state of health, do you have any difficulty performing the following activities: 04/07/2015 01/21/2015  Hearing? N N  Vision? N N  Difficulty concentrating or making decisions? N N  Walking or climbing stairs? N N  Dressing or bathing? N N  Doing errands, shopping? N N  Preparing Food and eating ? N -  Using the Toilet? N -  In the past six months, have you accidently leaked urine? N -  Do you have problems with loss of bowel control? N -  Managing your Medications? N -  Managing your Finances? N -  Housekeeping or managing your Housekeeping? N -    Fall/Depression Screening: PHQ 2/9 Scores 05/03/2015 04/07/2015 10/14/2012  PHQ - 2 Score 0 0 1   THN CM Care Plan Problem One        Most Recent Value   Care Plan Problem One  Heart failure knowledge deficit   Role Documenting the Problem One  Andrew for Problem One  Active   THN Long Term Goal (31-90 days)  Patient will be able to identify heart failure zones within 90 days.  THN Long Term Goal Start Date  04/07/15   Interventions for Problem One Long Term Goal  Reviewed heart failure zone chart and purpose.    THN CM Short Term Goal #1 (0-30 days)  Patient will be able to verbalize symptoms of heart failure green zone within 30 days.   THN CM Short Term Goal #1 Start Date  04/07/15   [facility] CM Short Term Goal #1 Met Date  05/03/15   Interventions for Short Term Goal #1  Explained to patient green zone of heart failure zone chart.    THN CM Short Term Goal #2 (0-30 days)  Patient will be able to name three foods that are high in sodium within 30 days.   THN CM Short Term Goal #2 Start Date  05/03/15 [goal continued]   Interventions for Short Term Goal #2  Reviewed with patient  foods high in sodium.    THN CM Short Term Goal #3  (0-30 days)  Patient will be able to name signs and symptoms of action plan yellow zone within 30 days.     THN CM Short Term Goal #3 Start Date  05/03/15   Interventions for Short Tern Goal #3  Discussed with patient signs and symptoms of yellow zone on heart failure chart.        Assessment: Patient continues to benefit from health coach outreach for disease management.  Plan: RN Health Coach will make referral to pharmacy for help with the donut hole. RN Health Coach will contact patient within one month and patient agrees to next contact.    Jone Baseman, RN, MSN [place] [phone]

## 2015-05-05 ENCOUNTER — Ambulatory Visit: Payer: Commercial Managed Care - HMO

## 2015-05-06 ENCOUNTER — Other Ambulatory Visit: Payer: Self-pay | Admitting: Pharmacist

## 2015-05-06 NOTE — Patient Outreach (Signed)
[place] [facility]) Care Management  [place]   05/06/2015  Shahzad Wragg Perman May 01, 1956 [id number]  Subjective: Jackie Khawaja Rozo is a 59 y.o. male who was referred to [place] for medication assistance.   Patient has reported that he is in the donut hole and that his insulin is costing him $168.   Patient is currently on Lantus and Humalog.   Objective:   Current Medications: Current Outpatient Prescriptions  Medication Sig Dispense Refill  . albuterol (PROVENTIL HFA;VENTOLIN HFA) 108 (90 BASE) MCG/ACT inhaler Inhale 2 puffs into the lungs every 4 (four) hours as needed for shortness of breath. 1 Inhaler 2  . albuterol (PROVENTIL) (2.5 MG/3ML) 0.083% nebulizer solution Inhale 3 mLs into the lungs every 6 (six) hours as needed.    . ALPRAZolam (XANAX) 0.5 MG tablet TAKE 1/2-1 TABLET BY MOUTH AT BEDTIME AS NEEDED 30 tablet 3  . aspirin EC 81 MG tablet Take 81 mg by mouth at bedtime.     . clopidogrel (PLAVIX) 75 MG tablet TAKE 1 TABLET AT BEDTIME 90 tablet 1  . fenofibrate 160 MG tablet Take 1 tablet (160 mg total) by mouth at bedtime. 30 tablet 5  . hydrALAZINE (APRESOLINE) 50 MG tablet Take 1 tablet (50 mg total) by mouth 3 (three) times daily. 90 tablet 0  . Insulin Glargine (LANTUS SOLOSTAR) 100 UNIT/ML Solostar Pen Inject 90 Units into the skin at bedtime. 15 mL 4  . insulin lispro (HUMALOG KWIKPEN) 100 UNIT/ML KiwkPen 5 units are breakfast. 5 units at lunch. 7 units at supper 15 mL 3  . losartan (COZAAR) 100 MG tablet Take 1 tablet (100 mg total) by mouth daily. 90 tablet 3  . metoprolol tartrate (LOPRESSOR) 25 MG tablet Take 1 tablet (25 mg total) by mouth 2 (two) times daily. 180 tablet 3  . nitroGLYCERIN (NITROSTAT) 0.4 MG SL tablet Place 0.4 mg under the tongue every 5 (five) minutes as needed for chest pain.    [name] torsemide (DEMADEX) 20 MG tablet TAKE 1 TABLET EVERY MORNING  AND TAKE 1/2 TABLET EVERY EVENING 135 tablet 1   No current  facility-administered medications for this visit.    Functional Status: In your present state of health, do you have any difficulty performing the following activities: 04/07/2015 01/21/2015  Hearing? N N  Vision? N N  Difficulty concentrating or making decisions? N N  Walking or climbing stairs? N N  Dressing or bathing? N N  Doing errands, shopping? N N  Preparing Food and eating ? N -  Using the Toilet? N -  In the past six months, have you accidently leaked urine? N -  Do you have problems with loss of bowel control? N -  Managing your Medications? N -  Managing your Finances? N -  Housekeeping or managing your Housekeeping? N -    Fall/Depression Screening: PHQ 2/9 Scores 05/03/2015 04/07/2015 10/14/2012  PHQ - 2 Score 0 0 1    Assessment:  Drugs sorted by system:  Neurologic/Psychologic: alprazolam  Cardiovascular: aspirin, clopidogrel, fenofibrate, hydralazine, losartan, metoprolol tartrate, nitroglycerin, torsemide  Pulmonary/Allergy: albuterol  Gastrointestinal: none noted  Endocrine: [place], Humalog  Renal: none noted  Topical: none noted  Pain: none noted  Miscellaneous: none noted   Duplications in therapy: none noted Gaps in therapy: patient on ARB, has diastolic heart failure so beta blocker appropriate for that, and patient allergic to statins so is on fenofibrate. Patient on aspirin. Medications to avoid in the elderly: patient <65  Drug interactions: none noted Other issues noted: none noted    1. Medication assistance: Patient cannot afford insulin. Can switch to a cheaper alternative such as 70/30 if needed but also can try to get accepted into patient assistance program if he qualifies.   Plan: 1. Medication assistance: I was unable to reach patient but I left a HIPAA compliant voicemail requesting that he return my call. I also provided him with Deanne Coffer, PharmD's number since I am only at [facility] on Fridays. I will refer patient to [place], care management assistant for Medicare Extra Help application and patient assistance program applications. I will try to reach out to him again in 1 week (05/13/15).   Nicoletta Ba, PharmD, [place] [facility] [phone]

## 2015-05-06 NOTE — Patient Outreach (Signed)
Patient returned call and is familiar with the patient assistance program for Lantus as he used it last year. He would like to use it again. Referral sent to [facility], care management assistant, for Lantus patient assistance application.  Nicoletta Ba, PharmD, [place] [facility] [phone]

## 2015-05-09 NOTE — Patient Outreach (Signed)
[name]) Care Management  05/09/2015  [place] October 03, 1955 [id number]   I called [name] today to enroll him into Extra Help.  He was willing to do it over the phone.  We completed the application online and submitted it today.  He should hear from social security administration in the next 2 to 4 weeks.  He is going to call me when he hears from them.  If he gets denied, I will begin the patient assistance application for [employer]. I will follow up in 4 weeks.  Dynasti Kerman L. Manuela Halbur, [place] Care Management Assistant

## 2015-05-18 NOTE — Patient Outreach (Signed)
[facility]) Care Management  05/18/2015  [place] May 26, 1956 [id number]   Telephone call received from patient to inform me that he received his denial letter from social security. He is going to send me the letter along with patient assistance applications that he needed to sign. Once received, I will send them off to the patient assistance programs for processing. Patient also informed me that he is almost out of his Lantus medication. I told him I would refer him to Deanne Coffer, PharmD and she will be in contact with him to see what assistance she can offer until we hear back from the pharmacy assistance programs.  Shakala Marlatt L. Savalas Monje, [place] Care Management Assistant

## 2015-05-20 ENCOUNTER — Other Ambulatory Visit: Payer: Self-pay

## 2015-05-20 NOTE — Patient Outreach (Signed)
I received notification from [facility], care management assistant that Eugene [name] was about to run out of his Lantus.  He stated he has about 4 days left.  He has completed an Extra Help application and will be completing paperwork for assistance from the pharmaceutical company.  He will run out of his Lantus prior to hearing from the company.  Due to the high risk of admission, he qualifies for the purchase of his Lantus by the Pharmacy Emergency Fund.  I contacted his pharmacy and was able to purchase the Lantus and Mr. Veneziale will pick it up.  I will follow up in a couple of week to determine the status of the paperwork.    Deanne Coffer, PharmD, [place] [phone]

## 2015-05-25 ENCOUNTER — Encounter (INDEPENDENT_AMBULATORY_CARE_PROVIDER_SITE_OTHER): Payer: Commercial Managed Care - HMO | Admitting: Ophthalmology

## 2015-05-25 DIAGNOSIS — H2513 Age-related nuclear cataract, bilateral: Secondary | ICD-10-CM | POA: Diagnosis not present

## 2015-05-25 DIAGNOSIS — H35033 Hypertensive retinopathy, bilateral: Secondary | ICD-10-CM

## 2015-05-25 DIAGNOSIS — H43813 Vitreous degeneration, bilateral: Secondary | ICD-10-CM

## 2015-05-25 DIAGNOSIS — I10 Essential (primary) hypertension: Secondary | ICD-10-CM | POA: Diagnosis not present

## 2015-05-25 DIAGNOSIS — E10351 Type 1 diabetes mellitus with proliferative diabetic retinopathy with macular edema: Secondary | ICD-10-CM

## 2015-05-25 DIAGNOSIS — E10311 Type 1 diabetes mellitus with unspecified diabetic retinopathy with macular edema: Secondary | ICD-10-CM | POA: Diagnosis not present

## 2015-05-25 NOTE — Patient Outreach (Signed)
[place] [facility]) Care Management  05/25/2015  [place] 04-18-1956 [id number]   Telephone outreach to patient returning his call. I had to leave a HIPPA compliant message for him to call me back.  Damita L. Rhodie, [place] Care Management Assistant

## 2015-05-26 ENCOUNTER — Encounter (HOSPITAL_COMMUNITY): Payer: Self-pay

## 2015-05-26 ENCOUNTER — Inpatient Hospital Stay (HOSPITAL_COMMUNITY)
Admission: EM | Admit: 2015-05-26 | Discharge: 2015-05-27 | DRG: 292 | Disposition: A | Discharge status: Home / Self Care | Payer: Commercial Managed Care - HMO | Attending: Internal Medicine | Admitting: Internal Medicine

## 2015-05-26 ENCOUNTER — Emergency Department (HOSPITAL_COMMUNITY): Payer: Commercial Managed Care - HMO

## 2015-05-26 ENCOUNTER — Emergency Department (HOSPITAL_BASED_OUTPATIENT_CLINIC_OR_DEPARTMENT_OTHER): Payer: Commercial Managed Care - HMO

## 2015-05-26 ENCOUNTER — Telehealth: Payer: Self-pay | Admitting: Cardiovascular Disease

## 2015-05-26 DIAGNOSIS — Z823 Family history of stroke: Secondary | ICD-10-CM

## 2015-05-26 DIAGNOSIS — I313 Pericardial effusion (noninflammatory): Secondary | ICD-10-CM | POA: Diagnosis present

## 2015-05-26 DIAGNOSIS — I252 Old myocardial infarction: Secondary | ICD-10-CM | POA: Diagnosis not present

## 2015-05-26 DIAGNOSIS — E785 Hyperlipidemia, unspecified: Secondary | ICD-10-CM | POA: Diagnosis not present

## 2015-05-26 DIAGNOSIS — J449 Chronic obstructive pulmonary disease, unspecified: Secondary | ICD-10-CM | POA: Diagnosis present

## 2015-05-26 DIAGNOSIS — I5033 Acute on chronic diastolic (congestive) heart failure: Principal | ICD-10-CM | POA: Diagnosis present

## 2015-05-26 DIAGNOSIS — Z833 Family history of diabetes mellitus: Secondary | ICD-10-CM | POA: Diagnosis not present

## 2015-05-26 DIAGNOSIS — R0602 Shortness of breath: Secondary | ICD-10-CM | POA: Diagnosis present

## 2015-05-26 DIAGNOSIS — Z8249 Family history of ischemic heart disease and other diseases of the circulatory system: Secondary | ICD-10-CM

## 2015-05-26 DIAGNOSIS — E1121 Type 2 diabetes mellitus with diabetic nephropathy: Secondary | ICD-10-CM

## 2015-05-26 DIAGNOSIS — F419 Anxiety disorder, unspecified: Secondary | ICD-10-CM

## 2015-05-26 DIAGNOSIS — G4730 Sleep apnea, unspecified: Secondary | ICD-10-CM | POA: Diagnosis not present

## 2015-05-26 DIAGNOSIS — I2510 Atherosclerotic heart disease of native coronary artery without angina pectoris: Secondary | ICD-10-CM | POA: Diagnosis not present

## 2015-05-26 DIAGNOSIS — I509 Heart failure, unspecified: Secondary | ICD-10-CM | POA: Diagnosis not present

## 2015-05-26 DIAGNOSIS — I3139 Other pericardial effusion (noninflammatory): Secondary | ICD-10-CM | POA: Diagnosis present

## 2015-05-26 DIAGNOSIS — E119 Type 2 diabetes mellitus without complications: Secondary | ICD-10-CM

## 2015-05-26 DIAGNOSIS — I319 Disease of pericardium, unspecified: Secondary | ICD-10-CM | POA: Diagnosis not present

## 2015-05-26 DIAGNOSIS — I10 Essential (primary) hypertension: Secondary | ICD-10-CM | POA: Diagnosis not present

## 2015-05-26 DIAGNOSIS — E1140 Type 2 diabetes mellitus with diabetic neuropathy, unspecified: Secondary | ICD-10-CM | POA: Diagnosis present

## 2015-05-26 LAB — CBC WITH DIFFERENTIAL/PLATELET
Basophils Absolute: 0 10*3/uL (ref 0.0–0.1)
Basophils Relative: 0 %
EOS ABS: 0.1 10*3/uL (ref 0.0–0.7)
EOS PCT: 2 %
HCT: 31.7 % — ABNORMAL LOW (ref 39.0–52.0)
HEMOGLOBIN: 10.8 g/dL — AB (ref 13.0–17.0)
Lymphocytes Relative: 26 %
Lymphs Abs: 1.4 10*3/uL (ref 0.7–4.0)
MCH: 30.9 pg (ref 26.0–34.0)
MCHC: 34.1 g/dL (ref 30.0–36.0)
MCV: 90.6 fL (ref 78.0–100.0)
MONOS PCT: 9 %
Monocytes Absolute: 0.5 10*3/uL (ref 0.1–1.0)
NEUTROS PCT: 63 %
Neutro Abs: 3.4 10*3/uL (ref 1.7–7.7)
Platelets: 159 10*3/uL (ref 150–400)
RBC: 3.5 MIL/uL — ABNORMAL LOW (ref 4.22–5.81)
RDW: 13.4 % (ref 11.5–15.5)
WBC: 5.5 10*3/uL (ref 4.0–10.5)

## 2015-05-26 LAB — BASIC METABOLIC PANEL
Anion gap: 7 (ref 5–15)
BUN: 26 mg/dL — AB (ref 6–20)
CALCIUM: 8.4 mg/dL — AB (ref 8.9–10.3)
CHLORIDE: 106 mmol/L (ref 101–111)
CO2: 24 mmol/L (ref 22–32)
CREATININE: 1.06 mg/dL (ref 0.61–1.24)
GFR calc Af Amer: >60 mL/min (ref >60)
GFR calc non Af Amer: >60 mL/min (ref >60)
Glucose, Bld: 182 mg/dL — ABNORMAL HIGH (ref 65–99)
Potassium: 4.3 mmol/L (ref 3.5–5.1)
SODIUM: 137 mmol/L (ref 135–145)

## 2015-05-26 LAB — D-DIMER, QUANTITATIVE: D-Dimer, Quant: 0.58 ug/mL-FEU — ABNORMAL HIGH (ref 0.00–0.48)

## 2015-05-26 LAB — GLUCOSE, CAPILLARY: Glucose-Capillary: 123 mg/dL — ABNORMAL HIGH (ref 65–99)

## 2015-05-26 LAB — TROPONIN I: TROPONIN I: 0.03 ng/mL (ref <0.031)

## 2015-05-26 LAB — BRAIN NATRIURETIC PEPTIDE: B NATRIURETIC PEPTIDE 5: 120 pg/mL — AB (ref 0.0–100.0)

## 2015-05-26 MED ORDER — INFLUENZA VAC SPLIT QUAD 0.5 ML IM SUSY
0.5000 mL | PREFILLED_SYRINGE | INTRAMUSCULAR | Status: DC
  Filled 2015-05-26: qty 0.5

## 2015-05-26 MED ORDER — SODIUM CHLORIDE 0.9 % IV SOLN: 250.0000 mL | INTRAVENOUS | Status: DC | PRN

## 2015-05-26 MED ORDER — ASPIRIN EC 81 MG PO TBEC
81.0000 mg | DELAYED_RELEASE_TABLET | Freq: Every day | ORAL | Status: DC
  Administered 2015-05-26: 81 mg via ORAL
  Filled 2015-05-26: qty 1

## 2015-05-26 MED ORDER — SODIUM CHLORIDE 0.9 % IJ SOLN
3.0000 mL | Freq: Two times a day (BID) | INTRAMUSCULAR | Status: DC
  Administered 2015-05-26 – 2015-05-27 (×2): 3 mL via INTRAVENOUS

## 2015-05-26 MED ORDER — LABETALOL HCL 5 MG/ML IV SOLN
20.0000 mg | Freq: Once | INTRAVENOUS | Status: AC
  Administered 2015-05-26: 20 mg via INTRAVENOUS
  Filled 2015-05-26: qty 4

## 2015-05-26 MED ORDER — FUROSEMIDE 10 MG/ML IJ SOLN
80.0000 mg | Freq: Once | INTRAMUSCULAR | Status: AC
  Administered 2015-05-26: 80 mg via INTRAVENOUS
  Filled 2015-05-26: qty 8

## 2015-05-26 MED ORDER — SODIUM CHLORIDE 0.9 % IJ SOLN
3.0000 mL | Freq: Two times a day (BID) | INTRAMUSCULAR | Status: DC
  Administered 2015-05-27: 3 mL via INTRAVENOUS

## 2015-05-26 MED ORDER — SODIUM CHLORIDE 0.9 % IJ SOLN
INTRAMUSCULAR | Status: AC
  Filled 2015-05-26: qty 500

## 2015-05-26 MED ORDER — ONDANSETRON HCL 4 MG PO TABS: 4.0000 mg | ORAL_TABLET | Freq: Four times a day (QID) | ORAL | Status: DC | PRN

## 2015-05-26 MED ORDER — CLOPIDOGREL BISULFATE 75 MG PO TABS
75.0000 mg | ORAL_TABLET | Freq: Every day | ORAL | Status: DC
  Administered 2015-05-26: 75 mg via ORAL
  Filled 2015-05-26: qty 1

## 2015-05-26 MED ORDER — OXYCODONE HCL 5 MG PO TABS: 5.0000 mg | ORAL_TABLET | ORAL | Status: DC | PRN

## 2015-05-26 MED ORDER — ALPRAZOLAM 0.5 MG PO TABS: 0.5000 mg | ORAL_TABLET | Freq: Two times a day (BID) | ORAL | Status: DC | PRN

## 2015-05-26 MED ORDER — ACETAMINOPHEN 325 MG PO TABS: 650.0000 mg | ORAL_TABLET | Freq: Four times a day (QID) | ORAL | Status: DC | PRN

## 2015-05-26 MED ORDER — ALBUTEROL SULFATE (2.5 MG/3ML) 0.083% IN NEBU: 3.0000 mL | INHALATION_SOLUTION | Freq: Four times a day (QID) | RESPIRATORY_TRACT | Status: DC | PRN

## 2015-05-26 MED ORDER — SODIUM CHLORIDE 0.9 % IJ SOLN
INTRAMUSCULAR | Status: AC
  Filled 2015-05-26: qty 60

## 2015-05-26 MED ORDER — IOHEXOL 350 MG/ML SOLN
100.0000 mL | Freq: Once | INTRAVENOUS | Status: AC | PRN
  Administered 2015-05-26: 100 mL via INTRAVENOUS

## 2015-05-26 MED ORDER — INSULIN GLARGINE 100 UNIT/ML ~~LOC~~ SOLN
90.0000 [IU] | Freq: Every day | SUBCUTANEOUS | Status: DC
  Administered 2015-05-26: 90 [IU] via SUBCUTANEOUS
  Filled 2015-05-26 (×4): qty 0.9

## 2015-05-26 MED ORDER — SODIUM CHLORIDE 0.9 % IJ SOLN: 3.0000 mL | INTRAMUSCULAR | Status: DC | PRN

## 2015-05-26 MED ORDER — ENOXAPARIN SODIUM 40 MG/0.4ML ~~LOC~~ SOLN
40.0000 mg | SUBCUTANEOUS | Status: DC
  Administered 2015-05-26: 40 mg via SUBCUTANEOUS
  Filled 2015-05-26: qty 0.4

## 2015-05-26 MED ORDER — FENOFIBRATE 160 MG PO TABS
160.0000 mg | ORAL_TABLET | Freq: Every day | ORAL | Status: DC
  Administered 2015-05-26: 160 mg via ORAL
  Filled 2015-05-26 (×2): qty 1

## 2015-05-26 MED ORDER — INSULIN ASPART 100 UNIT/ML ~~LOC~~ SOLN
0.0000 [IU] | Freq: Three times a day (TID) | SUBCUTANEOUS | Status: DC
  Administered 2015-05-27: 2 [IU] via SUBCUTANEOUS

## 2015-05-26 MED ORDER — HYDRALAZINE HCL 25 MG PO TABS
100.0000 mg | ORAL_TABLET | Freq: Two times a day (BID) | ORAL | Status: DC
  Administered 2015-05-26 – 2015-05-27 (×2): 100 mg via ORAL
  Filled 2015-05-26: qty 4
  Filled 2015-05-26: qty 2
  Filled 2015-05-26: qty 4
  Filled 2015-05-26: qty 2

## 2015-05-26 MED ORDER — LOSARTAN POTASSIUM 50 MG PO TABS
100.0000 mg | ORAL_TABLET | Freq: Every day | ORAL | Status: DC
  Administered 2015-05-27: 100 mg via ORAL
  Filled 2015-05-26 (×2): qty 2

## 2015-05-26 MED ORDER — METOPROLOL TARTRATE 25 MG PO TABS
25.0000 mg | ORAL_TABLET | Freq: Two times a day (BID) | ORAL | Status: DC
  Administered 2015-05-26 – 2015-05-27 (×2): 25 mg via ORAL
  Filled 2015-05-26 (×2): qty 1

## 2015-05-26 MED ORDER — ONDANSETRON HCL 4 MG/2ML IJ SOLN: 4.0000 mg | Freq: Four times a day (QID) | INTRAMUSCULAR | Status: DC | PRN

## 2015-05-26 MED ORDER — ACETAMINOPHEN 650 MG RE SUPP: 650.0000 mg | Freq: Four times a day (QID) | RECTAL | Status: DC | PRN

## 2015-05-26 NOTE — Telephone Encounter (Signed)
Patient called wanting to know if he needs to be seen or go to ED in reference to having SOB since yesterday.  Gives out of breath just doing normal walking. While sitting he is still winded.  Stated that when he had his last heart attack he started out this way

## 2015-05-26 NOTE — H&P (Addendum)
[facility] History and Physical  Eugene [name] [id number] DOB: 1956-05-15 DOA: 05/26/2015  Referring physician: Dr Dayna Barker - [facility] PCP: [name], MD   Chief Complaint: SOB  HPI: Eugene [name] is a 59 y.o. male  Shortness breath. Started 1-2 days ago. Constant. Getting worse. Gradual onset. Worsens with exertion. Socially with nonproductive intermittent cough. Denies any chest pain, palpitations, LOC, dizziness. Mild lower extremity swelling during this period time.  Partially 4 weeks ago patient states that he began exercising again approximately 5-6 times a week and dressed changed his diet. States that his blood glucose typically runs in the mid 100s.    Review of Systems:  Constitutional:  No weight loss, night sweats, Fevers, chills,  HEENT:  No headaches, Difficulty swallowing,Tooth/dental problems,Sore throat,  No sneezing, itching, ear ache, nasal congestion, post nasal drip,  Cardio-vascular:  No chest pain, Orthopnea, PND, dizziness, palpitations  GI:  No heartburn, indigestion, abdominal pain, nausea, vomiting, diarrhea, change in bowel habits, loss of appetite  Resp: Per HPi Skin:  no rash or lesions.  GU:  no dysuria, change in color of urine, no urgency or frequency. No flank pain.  Musculoskeletal:   No joint pain or swelling. No decreased range of motion. No back pain.  Psych:  No change in mood or affect. No depression or anxiety. No memory loss.   Past Medical History  Diagnosis Date  . Arteriosclerotic cardiovascular disease (ASCVD)     a. 05/2011 Cath/PCI: LM nl, LAD 79m, D1 small, D2 small 60m, LCX large 40p, RCA 50-60p, 99 hazy @ origin of PDA with 70-80 in PDA (2.5x26 Resolute Integrity & 3.0x15 Resolute Integrity DES).;  b. 08/2012 Inflat  STEMI/Cath/PCI: LM minor irregs, LAD 50p, D1 50, LCX nl, OM1 25, RCA 30-40p, 100d (treated with 2.75x32mm Promus Premier DES);  c. 08/2012 Echo: EF 55-60%, basal inferopost HK.  [name] Hyperlipidemia    . Diabetes mellitus     Peripheral neuropathy  . Bell palsy   . Hypertension   . COPD (chronic obstructive pulmonary disease)   . Sleep apnea   . Gallstones   . Cholelithiasis 07/2012    Asymptomatic; identified incidentally  . PONV (postoperative nausea and vomiting)   . Anxiety   . C. difficile colitis     a. 08/2012  . Myocardial infarct 09/08/12  . Nephrolithiasis   . Contrast dye induced nephropathy     a. 08/2012 post cath/pci  . CHF (congestive heart failure)   . Asthma   . Heart disease   . Old myocardial infarction   . Anginal pain   . GERD (gastroesophageal reflux disease)    Past Surgical History  Procedure Laterality Date  . Circumcision    . Stents    . Esophagogastroduodenoscopy      in [place] over 20 yrs ago  . Colonoscopy      In [facility], approximately 2011 per patient, was normal. Advised to come back in 10 years.  . Esophagogastroduodenoscopy  06/12/2012    [id number] esophagus-status post Venia Minks dilation. Abnormal gastric mucosa of uncertain significance-status post biopsy  . Cardiac catheterization    . Subxyphoid pericardial window N/A 06/23/2013    Procedure: SUBXYPHOID PERICARDIAL WINDOW;  Surgeon: Grace Isaac, MD;  Location: [place];  Service: Thoracic;  Laterality: N/A;  . Intraoperative transesophageal echocardiogram N/A 06/23/2013    Procedure: INTRAOPERATIVE TRANSESOPHAGEAL ECHOCARDIOGRAM;  Surgeon: Grace Isaac, MD;  Location: [place];  Service: Open Heart Surgery;  Laterality: N/A;  .  Left heart catheterization with coronary angiogram N/A 09/08/2012    Procedure: LEFT HEART CATHETERIZATION WITH CORONARY ANGIOGRAM;  Surgeon: [name], MD;  Location[facility] C[facility] LAB;  Service: Cardiovascular;  Laterality: N/A;  . Chest tube insertion     Social History:  reports that he has never smoked. He has never used smokeless tobacco. He reports that he does not drink alcohol or use illicit drugs.  Allergies  Allergen Reactions   . Hydrocodone Nausea And Vomiting  . Lisinopril Cough  . Neurontin [Gabapentin] Other (See Comments)    Suicidal thoughts  . Statins Other (See Comments)    Muscle aches  . Metformin And Related Diarrhea and Other (See Comments)    Intestinal side effects  . Norvasc [Amlodipine Besylate] Swelling    Pedal edema    Family History  Problem Relation Age of Onset  . Diabetes Mother   . Heart attack Mother   . Stroke Mother   . Diabetes Sister   . Sleep apnea Sister   . Hypertension Brother   . Diabetes Brother   . Colon cancer Neg Hx   . Liver disease Neg Hx   . Diabetes Brother   . Hypertension Brother      Prior to Admission medications   Medication Sig Start Date End Date Taking? Authorizing Provider  ALPRAZolam Duanne Moron) 0.5 MG tablet TAKE 1/2-1 TABLET BY MOUTH AT BEDTIME AS NEEDED 03/25/15  Yes Mikey Kirschner, MD  aspirin EC 81 MG tablet Take 81 mg by mouth at bedtime.    Yes Historical Provider, MD  BESIVANCE 0.6 % SUSP Place 1 drop into both eyes 2 (two) times daily. 04/27/15  Yes Historical Provider, MD  clopidogrel (PLAVIX) 75 MG tablet TAKE 1 TABLET AT BEDTIME 03/31/15  Yes [name], MD  fenofibrate 160 MG tablet Take 1 tablet (160 mg total) by mouth at bedtime. 12/08/14  Yes [name], MD  hydrALAZINE (APRESOLINE) 50 MG tablet Take 1 tablet (50 mg total) by mouth 3 (three) times daily. Patient taking differently: Take 75 mg by mouth 2 (two) times daily.  12/27/14  Yes Shanker Kristeen Mans, MD  Insulin Glargine (LANTUS SOLOSTAR) 100 UNIT/ML Solostar Pen Inject 90 Units into the skin at bedtime. 03/07/15  Yes [name], MD  insulin lispro (HUMALOG KWIKPEN) 100 UNIT/ML KiwkPen 5 units are breakfast. 5 units at lunch. 7 units at supper 08/12/14  Yes [name], MD  losartan (COZAAR) 100 MG tablet Take 1 tablet (100 mg total) by mouth daily. 04/06/15  Yes [name], MD  metoprolol tartrate (LOPRESSOR) 25 MG tablet Take 1 tablet (25 mg total) by mouth 2 (two)  times daily. 04/06/15  Yes [name], MD  torsemide (DEMADEX) 20 MG tablet TAKE 1 TABLET EVERY MORNING  AND TAKE 1/2 TABLET EVERY EVENING Patient taking differently: TAKE 2 TABLET EVERY MORNING 04/25/15  Yes [name], MD  albuterol (PROVENTIL HFA;VENTOLIN HFA) 108 (90 BASE) MCG/ACT inhaler Inhale 2 puffs into the lungs every 4 (four) hours as needed for shortness of breath. 09/28/14   [name], MD  albuterol (PROVENTIL) (2.5 MG/3ML) 0.083% nebulizer solution Inhale 3 mLs into the lungs every 6 (six) hours as needed for wheezing or shortness of breath.  09/28/14   Historical Provider, MD  nitroGLYCERIN (NITROSTAT) 0.4 MG SL tablet Place 0.4 mg under the tongue every 5 (five) minutes as needed for chest pain.    Historical Provider, MD   Physical Exam: Danley Danker  Vitals:   05/26/15 1604 05/26/15 1648 05/26/15 1730 05/26/15 1800  BP: 175/80 168/85 166/86 167/81  Pulse: 74 69 69 67  Temp:      TempSrc:      Resp: 16 23 18 19   Height:      Weight:      SpO2: 100% 98% 90% 96%    Wt Readings from Last 3 Encounters:  05/26/15 119.296 kg (263 lb)  04/13/15 118.389 kg (261 lb)  04/07/15 120.657 kg (266 lb)    General:  Appears calm and comfortable Eyes:  PERRL, normal lids, irises & conjunctiva ENT:  grossly normal hearing, lips & tongue Neck:  no LAD, masses or thyromegaly Cardiovascular:  Very distant heart sounds, RRR, no m/r/g. 2+ LE edema bilat Telemetry:  SR, no arrhythmias  Respiratory:  CTA bilaterally, no w/r/r. Normal respiratory effort. Abdomen:  soft, ntnd Skin:  no rash or induration seen on limited exam Musculoskeletal:  grossly normal tone BUE/BLE Psychiatric:  grossly normal mood and affect, speech fluent and appropriate Neurologic:  grossly non-focal.          Labs on Admission:  Basic Metabolic Panel:  Recent Labs Lab 05/26/15 1125  NA 137  K 4.3  CL 106  CO2 24  GLUCOSE 182*  BUN 26*  CREATININE 1.06  CALCIUM 8.4*   Liver Function Tests: No  results for input(s): AST, ALT, ALKPHOS, BILITOT, PROT, ALBUMIN in the last 168 hours. No results for input(s): LIPASE, AMYLASE in the last 168 hours. No results for input(s): AMMONIA in the last 168 hours. CBC:  Recent Labs Lab 05/26/15 1125  WBC 5.5  NEUTROABS 3.4  HGB 10.8*  HCT 31.7*  MCV 90.6  PLT 159   Cardiac Enzymes:  Recent Labs Lab 05/26/15 1125  TROPONINI 0.03    BNP (last 3 results)  Recent Labs  08/28/14 2015 12/25/14 1305 05/26/15 1125  BNP 62.0 72.0 120.0*    ProBNP (last 3 results)  Recent Labs  07/03/14 1636  PROBNP 351.9*    CBG: No results for input(s): GLUCAP in the last 168 hours.  Radiological Exams on Admission: Dg Chest 2 View  05/26/2015   CLINICAL DATA:  Short of breath.  Onset of symptoms yesterday.  EXAM: CHEST  2 VIEW  COMPARISON:  08/28/2014.  12/25/2014.  07/02/2013.  FINDINGS: The cardiopericardial silhouette is enlarged. There is interstitial pulmonary edema on with her Awanda Mink B lines at the periphery. Basilar atelectasis. Monitoring leads project over the chest. No focal consolidation. No definite pleural effusion.  IMPRESSION: Mild CHF with interstitial edema, low volumes and basilar atelectasis.   Electronically Signed   By: Dereck Ligas M.D.   On: 05/26/2015 12:10   Ct Angio Chest Pe W/cm &/or Wo Cm  05/26/2015   CLINICAL DATA:  Nonproductive cough and shortness of breath for 2 days  EXAM: CT ANGIOGRAPHY CHEST WITH CONTRAST  TECHNIQUE: Multidetector CT imaging of the chest was performed using the standard protocol during bolus administration of intravenous contrast. Multiplanar CT image reconstructions and MIPs were obtained to evaluate the vascular anatomy.  CONTRAST:  153mL OMNIPAQUE IOHEXOL 350 MG/ML SOLN  COMPARISON:  Plain film from earlier in the same day  FINDINGS: The lungs are well aerated bilaterally without focal confluent infiltrate. Some minimal basilar atelectasis is seen. Tiny small bilateral pleural effusions are  noted. No pneumothorax is noted.  The thoracic inlet is within normal limits. The thoracic aorta is unremarkable without aneurysmal dilatation or dissection.  Pulmonary artery is well  visualized and demonstrates a normal branching pattern. No filling defects to suggest pulmonary emboli are noted. No significant hilar or mediastinal adenopathy is noted. A few small less than 1 cm nodes are noted in the aorticopulmonary window. A pericardial effusion is noted which measures approximately 2 cm in greatest dimension along the posterior aspect of the left ventricle.  The visualized upper abdomen is within normal limits. The osseous structures are within normal limits with evidence of mild degenerative change of the thoracic spine.  Review of the MIP images confirms the above findings.  IMPRESSION: Pericardial effusion.  No evidence of pulmonary embolism.  Tiny small effusions.   Electronically Signed   By: Inez Catalina M.D.   On: 05/26/2015 14:39   Assessment/Plan Principal Problem:   Pericardial effusion without cardiac tamponade Active Problems:   DM type 2 (diabetes mellitus, type 2)   Essential hypertension   CAD- MI/RCA PCI-DES x2 2012, and RCA DES Jan 2014   SOB (shortness of breath)   Acute on chronic diastolic heart failure   HLD (hyperlipidemia)   Anxiety   SOB: Likely multifactorial including acute on chronic diastolic congestive heart failure and pericardial effusion. Increasing lower extremity edema over past several days and presenting with hypertensive urgency. Echo performed at time of admission showing EF of 60-65% and grade 2 diastolic dysfunction. Mild acute exacerbation as BNP only 129. 2 cm pericardial effusion. Hemodynamically stable and noted evidence of tamponade.  . CTA showing pericardial effusion but no acute respiratory process. Cardiology, Dr. branch consult by ED and following. Appreciate his input. - Tele - IV Lasix 80 mg 1 per Dr. [name]. Further diuretic dosing will be  determined by him in the morning. - Follow-up pericardial effusion as outpatient, per cards.  - CPAP at night for OSA  Hypertensive urgency: See cardiology note - Per Cards will inc Hydralazine to 100 BID and hold torsemide.  - Continue Losartan, Metop,  - Further recs per cards note  DM: well controlled. Last A1c 7.1 in august - continue home lantus 90 units QHS - SSI  CAD: s/p MI and Cath adn DES placement - continue ASA and Plavix  ANxiety: - continue Xanax  HLD: - continue fenofibrate   Code Status: FULL  DVT Prophylaxis: Hep Family Communication: None Disposition Plan: Pending improvement     MERRELL, DAVID Lenna Sciara, MD Family Medicine [facility] [email or web address] Password [email or web address]

## 2015-05-26 NOTE — Telephone Encounter (Signed)
Made an attempt to contact patient in r/e to this phone note and noticed that patient was at [facility] ED.

## 2015-05-26 NOTE — ED Provider Notes (Signed)
CSN: [id number]     Arrival date & time 05/26/15  1111 History  By signing my name below, I, Erling Conte, attest that this documentation has been prepared under the direction and in the presence of Merrily Pew, MD. Electronically Signed: Erling Conte, ED Scribe. 05/26/2015. 11:36 AM.      Chief Complaint  Patient presents with  . Shortness of Breath    Patient is a 59 y.o. male presenting with shortness of breath. The history is provided by the patient. No language interpreter was used.  Shortness of Breath Severity:  Mild Onset quality:  Gradual Duration:  1 day Timing:  Intermittent Progression:  Worsening Chronicity:  Chronic (h/o COPD) Context: activity   Relieved by:  Rest Worsened by:  Exertion and activity Ineffective treatments:  None tried Associated symptoms: cough   Associated symptoms: no chest pain and no fever     HPI Comments: Eugene [name] is a 59 y.o. male with a h/o CHF, COPD, HTN, DM and MI who presents to the Emergency Department complaining of intermittent, gradually worsening, mild, shortness of breath onset 1 day. He reports associated intermittent chest tightness and non productive cough. He endorses the SOB is exacerbated with ambulation. He notes he has not able to walk very far without SOB exacerbation. Pt does not use O2 at home. Pt notes he took his blood pressure medication this morning, losartan. He regularly checks his BP at home. Pt has not had any other meds PTA. Pt reports he has issues with lying flat but this has been a chronic issue since his MI in 2014. Pt denies any recent travel or traveling long distances. Pt notes he has mild leg swelling at baseline.He denies any fever, chest pain, abdominal distention or increased leg swelling.   Past Medical History  Diagnosis Date  . Arteriosclerotic cardiovascular disease (ASCVD)     a. 05/2011 Cath/PCI: LM nl, LAD 74m, D1 small, D2 small 63m, LCX large 40p, RCA 50-60p, 99 hazy @ origin of  PDA with 70-80 in PDA (2.5x26 Resolute Integrity & 3.0x15 Resolute Integrity DES).;  b. 08/2012 Inflat  STEMI/Cath/PCI: LM minor irregs, LAD 50p, D1 50, LCX nl, OM1 25, RCA 30-40p, 100d (treated with 2.75x87mm Promus Premier DES);  c. 08/2012 Echo: EF 55-60%, basal inferopost HK.  [name] Hyperlipidemia   . Diabetes mellitus     Peripheral neuropathy  . Bell palsy   . Hypertension   . COPD (chronic obstructive pulmonary disease)   . Sleep apnea   . Gallstones   . Cholelithiasis 07/2012    Asymptomatic; identified incidentally  . PONV (postoperative nausea and vomiting)   . Anxiety   . C. difficile colitis     a. 08/2012  . Myocardial infarct 09/08/12  . Nephrolithiasis   . Contrast dye induced nephropathy     a. 08/2012 post cath/pci  . CHF (congestive heart failure)   . Asthma   . Heart disease   . Old myocardial infarction   . Anginal pain   . GERD (gastroesophageal reflux disease)    Past Surgical History  Procedure Laterality Date  . Circumcision    . Stents    . Esophagogastroduodenoscopy      in [place] over 20 yrs ago  . Colonoscopy      In [facility], approximately 2011 per patient, was normal. Advised to come back in 10 years.  . Esophagogastroduodenoscopy  06/12/2012    [id number] esophagus-status post Venia Minks dilation. Abnormal gastric mucosa of uncertain significance-status  post biopsy  . Cardiac catheterization    . Subxyphoid pericardial window N/A 06/23/2013    Procedure: SUBXYPHOID PERICARDIAL WINDOW;  Surgeon: Grace Isaac, MD;  Location: [place];  Service: Thoracic;  Laterality: N/A;  . Intraoperative transesophageal echocardiogram N/A 06/23/2013    Procedure: INTRAOPERATIVE TRANSESOPHAGEAL ECHOCARDIOGRAM;  Surgeon: Grace Isaac, MD;  Location: [place];  Service: Open Heart Surgery;  Laterality: N/A;  . Left heart catheterization with coronary angiogram N/A 09/08/2012    Procedure: LEFT HEART CATHETERIZATION WITH CORONARY ANGIOGRAM;  Surgeon:  [name], MD;  Location: [facility] CATH LAB;  Service: Cardiovascular;  Laterality: N/A;  . Chest tube insertion     Family History  Problem Relation Age of Onset  . Diabetes Mother   . Heart attack Mother   . Stroke Mother   . Diabetes Sister   . Sleep apnea Sister   . Hypertension Brother   . Diabetes Brother   . Colon cancer Neg Hx   . Liver disease Neg Hx   . Diabetes Brother   . Hypertension Brother    Social History  Substance Use Topics  . Smoking status: Never Smoker   . Smokeless tobacco: Never Used  . Alcohol Use: No     Comment: heavy etoh use 30 years ago    Review of Systems  Constitutional: Negative for fever.  Respiratory: Positive for cough, chest tightness and shortness of breath.   Cardiovascular: Negative for chest pain and leg swelling.  Gastrointestinal: Negative for abdominal distention.  All other systems reviewed and are negative.     Allergies  Hydrocodone; Lisinopril; Neurontin; Statins; Metformin and related; and Norvasc  Home Medications   Prior to Admission medications   Medication Sig Start Date End Date Taking? Authorizing Provider  albuterol (PROVENTIL HFA;VENTOLIN HFA) 108 (90 BASE) MCG/ACT inhaler Inhale 2 puffs into the lungs every 4 (four) hours as needed for shortness of breath. 09/28/14   [name], MD  albuterol (PROVENTIL) (2.5 MG/3ML) 0.083% nebulizer solution Inhale 3 mLs into the lungs every 6 (six) hours as needed. 09/28/14   Historical Provider, MD  ALPRAZolam Duanne Moron) 0.5 MG tablet TAKE 1/2-1 TABLET BY MOUTH AT BEDTIME AS NEEDED 03/25/15   Mikey Kirschner, MD  aspirin EC 81 MG tablet Take 81 mg by mouth at bedtime.     Historical Provider, MD  clopidogrel (PLAVIX) 75 MG tablet TAKE 1 TABLET AT BEDTIME 03/31/15   [name], MD  fenofibrate 160 MG tablet Take 1 tablet (160 mg total) by mouth at bedtime. 12/08/14   [name], MD  hydrALAZINE (APRESOLINE) 50 MG tablet Take 1 tablet (50 mg total) by mouth 3 (three) times  daily. 12/27/14   Shanker Kristeen Mans, MD  Insulin Glargine (LANTUS SOLOSTAR) 100 UNIT/ML Solostar Pen Inject 90 Units into the skin at bedtime. 03/07/15   [name], MD  insulin lispro (HUMALOG KWIKPEN) 100 UNIT/ML KiwkPen 5 units are breakfast. 5 units at lunch. 7 units at supper 08/12/14   [name], MD  losartan (COZAAR) 100 MG tablet Take 1 tablet (100 mg total) by mouth daily. 04/06/15   [name], MD  metoprolol tartrate (LOPRESSOR) 25 MG tablet Take 1 tablet (25 mg total) by mouth 2 (two) times daily. 04/06/15   [name], MD  nitroGLYCERIN (NITROSTAT) 0.4 MG SL tablet Place 0.4 mg under the tongue every 5 (five) minutes as needed for chest pain.    Historical Provider, MD  torsemide [facility])  20 MG tablet TAKE 1 TABLET EVERY MORNING  AND TAKE 1/2 TABLET EVERY EVENING 04/25/15   [name], MD   Triage Vitals: BP 212/91 mmHg  Pulse 89  Temp(Src) 98.3 F (36.8 C) (Oral)  Resp 22  Ht 6\' 1"  (1.854 m)  Wt 263 lb (119.296 kg)  BMI 34.71 kg/m2  SpO2 92%  Physical Exam  Constitutional: He is oriented to person, place, and time. He appears well-developed and well-nourished. No distress.  HENT:  Head: Normocephalic and atraumatic.  Eyes: Conjunctivae and EOM are normal.  Neck: Neck supple. No tracheal deviation present.  Cardiovascular: Normal rate, regular rhythm and normal heart sounds.   Lower extremities with no edema  Pulmonary/Chest: Effort normal. No respiratory distress.  Slight crackles in bases but otherwise equal and symmetric  Abdominal: Soft. There is no tenderness. There is no rebound and no guarding.  Musculoskeletal: Normal range of motion. He exhibits no edema.  Neurological: He is alert and oriented to person, place, and time.  Skin: Skin is warm and dry.  Psychiatric: He has a normal mood and affect. His behavior is normal.  Nursing note and vitals reviewed.   ED Course  Procedures (including critical care time)  DIAGNOSTIC STUDIES: Oxygen  Saturation is 92% on RA, adequate by my interpretation.    COORDINATION OF CARE: 11:35 AM- Will order CXR, 12 lead EKG and diagnostic lab work. Pt advised of plan for treatment and pt agrees.     Labs Review Labs Reviewed  CBC WITH DIFFERENTIAL/PLATELET  BASIC METABOLIC PANEL  TROPONIN I  BRAIN NATRIURETIC PEPTIDE    Imaging Review No results found. I have personally reviewed and evaluated these images and lab results as part of my medical decision-making.   EKG Interpretation   Date/Time:  Thursday May 26 2015 11:16:46 EDT Ventricular Rate:  90 PR Interval:  195 QRS Duration: 104 QT Interval:  357 QTC Calculation: 437 R Axis:   84 Text Interpretation:  Sinus rhythm Consider left atrial enlargement  Anterior infarct, old Nonspecific T abnormalities, lateral leads No  significant change since last tracing Confirmed by [facility] MD, Corene Cornea  [phone]) on 05/26/2015 11:21:48 AM      MDM   Final diagnoses:  SOB (shortness of breath)  Pericardial effusion   Initial differential is 4 PE, ACS, CHF exacerbation versus pneumonia. EKG unremarkable, d-dimer elevated positive CT scan which showed pericardial effusion and this could be related to his symptoms as he's had one in the past and need to be drained. Discussed the case with the medicine team last psych consult cardiology. Cardiology is going to get an echocardiogram and they will give recommendations about whether the patient is okay to stay here or needs transfer to [facility]. Cardiology thought HTN was just as likely of cause, so i gave dose of labetalol awaiting echo results.    I personally performed the services described in this documentation, which was scribed in my presence. The recorded information has been reviewed and is accurate.     Merrily Pew, MD 05/27/15 [phone]

## 2015-05-26 NOTE — Consult Note (Signed)
Primary cardiologist: Dr Kate Sable MD Consulting cardiologist: Dr [name] MD  Clinical Summary Eugene [name] is a 59 y.o.male with history of chronic diastolic heart failure, previous admissions with hypertensive urgency with diastolic HF exacerbations, CAD with prior stents, HL, DM2, COPD, pericardial effusion with subxiphoid window 05/2013 admitted with SOB. Reports 2-3 day worsening SOB and DOE. No chest pain or palpitations. Notes some LE edema as well. Weights have been stable he reports.    ER vitals: bp 212/91 p 89 92% RA Wt 263 lbs (04/13/15 was 261 lbs) D-dimer 0.58, BNP 120, Hgb 10.8, Plt 159, K 4.3, Cr 1.06, BUN 26, trop neg x1 CXR mild edema CT PE no PE, moderate to large pericardial effusion EKG SR, anteroseptal Q waves. 12/2014 echo: LVEF 55-60%, moderate LVH, grade I diastolic dysfunction      Allergies  Allergen Reactions  . Hydrocodone Nausea And Vomiting  . Lisinopril Cough  . Neurontin [Gabapentin] Other (See Comments)    Suicidal thoughts  . Statins Other (See Comments)    Muscle aches  . Metformin And Related Diarrhea and Other (See Comments)    Intestinal side effects  . Norvasc [Amlodipine Besylate] Swelling    Pedal edema    Medications Scheduled Medications: . sodium chloride      . sodium chloride         Infusions:     PRN Medications:     Past Medical History  Diagnosis Date  . Arteriosclerotic cardiovascular disease (ASCVD)     a. 05/2011 Cath/PCI: LM nl, LAD 46m, D1 small, D2 small 67m, LCX large 40p, RCA 50-60p, 99 hazy @ origin of PDA with 70-80 in PDA (2.5x26 Resolute Integrity & 3.0x15 Resolute Integrity DES).;  b. 08/2012 Inflat  STEMI/Cath/PCI: LM minor irregs, LAD 50p, D1 50, LCX nl, OM1 25, RCA 30-40p, 100d (treated with 2.75x28mm Promus Premier DES);  c. 08/2012 Echo: EF 55-60%, basal inferopost HK.  [name] Hyperlipidemia   . Diabetes mellitus     Peripheral neuropathy  . Bell palsy   . Hypertension   . COPD  (chronic obstructive pulmonary disease)   . Sleep apnea   . Gallstones   . Cholelithiasis 07/2012    Asymptomatic; identified incidentally  . PONV (postoperative nausea and vomiting)   . Anxiety   . C. difficile colitis     a. 08/2012  . Myocardial infarct 09/08/12  . Nephrolithiasis   . Contrast dye induced nephropathy     a. 08/2012 post cath/pci  . CHF (congestive heart failure)   . Asthma   . Heart disease   . Old myocardial infarction   . Anginal pain   . GERD (gastroesophageal reflux disease)     Past Surgical History  Procedure Laterality Date  . Circumcision    . Stents    . Esophagogastroduodenoscopy      in [place] over 20 yrs ago  . Colonoscopy      In [facility], approximately 2011 per patient, was normal. Advised to come back in 10 years.  . Esophagogastroduodenoscopy  06/12/2012    [id number] esophagus-status post Venia Minks dilation. Abnormal gastric mucosa of uncertain significance-status post biopsy  . Cardiac catheterization    . Subxyphoid pericardial window N/A 06/23/2013    Procedure: SUBXYPHOID PERICARDIAL WINDOW;  Surgeon: Grace Isaac, MD;  Location: [place];  Service: Thoracic;  Laterality: N/A;  . Intraoperative transesophageal echocardiogram N/A 06/23/2013    Procedure: INTRAOPERATIVE TRANSESOPHAGEAL ECHOCARDIOGRAM;  Surgeon: Grace Isaac, MD;  Location: [facility] OR;  Service: Open Heart Surgery;  Laterality: N/A;  . Left heart catheterization with coronary angiogram N/A 09/08/2012    Procedure: LEFT HEART CATHETERIZATION WITH CORONARY ANGIOGRAM;  Surgeon: [name], MD;  Location: [facility] CATH LAB;  Service: Cardiovascular;  Laterality: N/A;  . Chest tube insertion      Family History  Problem Relation Age of Onset  . Diabetes Mother   . Heart attack Mother   . Stroke Mother   . Diabetes Sister   . Sleep apnea Sister   . Hypertension Brother   . Diabetes Brother   . Colon cancer Neg Hx   . Liver disease Neg Hx   . Diabetes  Brother   . Hypertension Brother     Social History Eugene [name] reports that he has never smoked. He has never used smokeless tobacco. Eugene [name] reports that he does not drink alcohol.  Review of Systems CONSTITUTIONAL: No weight loss, fever, chills, weakness or fatigue.  HEENT: Eyes: No visual loss, blurred vision, double vision or yellow sclerae. No hearing loss, sneezing, congestion, runny nose or sore throat.  SKIN: No rash or itching.  CARDIOVASCULAR: per HPI RESPIRATORY: No shortness of breath, cough or sputum.  GASTROINTESTINAL: No anorexia, nausea, vomiting or diarrhea. No abdominal pain or blood.  GENITOURINARY: no polyuria, no dysuria NEUROLOGICAL: No headache, dizziness, syncope, paralysis, ataxia, numbness or tingling in the extremities. No change in bowel or bladder control.  MUSCULOSKELETAL: No muscle, back pain, joint pain or stiffness.  HEMATOLOGIC: No anemia, bleeding or bruising.  LYMPHATICS: No enlarged nodes. No history of splenectomy.  PSYCHIATRIC: No history of depression or anxiety.      Physical Examination Blood pressure 175/80, pulse 74, temperature 98.3 F (36.8 C), temperature source Oral, resp. rate 16, height 6\' 1"  (1.854 m), weight 263 lb (119.296 kg), SpO2 100 %. No intake or output data in the 24 hours ending 05/26/15 1611  HEENT: sclera clear  Cardiovascular: RRR, no m/r/g, no jvd  Respiratory: crackles bilateral bases  GI: abdomen soft, NT, ND  MSK: 1-2+ bilateral LE edema  Neuro: no focal deficits  Psych: appropriate affect   Lab Results  Basic Metabolic Panel:  Recent Labs Lab 05/26/15 1125  NA 137  K 4.3  CL 106  CO2 24  GLUCOSE 182*  BUN 26*  CREATININE 1.06  CALCIUM 8.4*    Liver Function Tests: No results for input(s): AST, ALT, ALKPHOS, BILITOT, PROT, ALBUMIN in the last 168 hours.  CBC:  Recent Labs Lab 05/26/15 1125  WBC 5.5  NEUTROABS 3.4  HGB 10.8*  HCT 31.7*  MCV 90.6  PLT 159    Cardiac  Enzymes:  Recent Labs Lab 05/26/15 1125  TROPONINI 0.03    BNP: Invalid input(s): POCBNP     Impression/Recommendations 1. Acute on chronic diastolic heart failure - likely secondary to hypertensive urgency leading to decompensation of his diastolic HF, he has had similar prior admissions. - will give IV lasix 80mg  x 1 for tonight and reassess for dosing in the AM. I have written for a dose to be given in the ER.   2. Pericardial effusion - noted by CT scan this admit. History of prior pericardial window.  - no clinical signs of tamponade, heart rate is normal and he is actually hypertensive - echo in ER shows fairly stable mild to moderate effusion, no evidence of tamponade - continue to follow clinically as outpatient, no need for further inpatient imaging.   3. CAD -  no evidence of ACS, continue home current cardiac meds. Can cycle enzymes and EKG overnight  4. HTN - severely elevated on presentation, trending down after IV labetalol - he reports medication compliance. - would increase his hydralazine to 100mg  bid. Likely start him on more bp effective diuretic once he is euvolemic, like chlorthalidone 50mg  daily, and change his torsemide to prn only. Room to change his hydralzine to tid dosing if needed, however I think a thiazide diuretic in the long run would be more affective for him. Hold on thiazide for now while dosing IV lasix.     [name], M.D.

## 2015-05-26 NOTE — Progress Notes (Signed)
MD ordered CPAP. Patient said he would rather not wear one tonight. Told patient my name and to tell RN to call Respiratory if he happened to change his mind. RT will continue to monitor.

## 2015-05-26 NOTE — Progress Notes (Addendum)
Contacted by ER physician regarding patient with pericardial effusion by CT scan. Clinically he has no signs of tamponade. We will have an echo done in the ER to evaluate further. Unclear if this is playing any role in his SOB at this time, though would likely guess more related to his severe HTN and mild pulmonary edema on CXR. He also has a history of COPD. If no tamponade physiology by echo would admit to [facility] for bp control, mild diuresis +/- COPD management.   Zandra Abts MD

## 2015-05-26 NOTE — ED Notes (Signed)
Pt reports nonproductive cough and sob since yesterday.  Reports history of chf.  Denies fever.  Denies cp.

## 2015-05-26 NOTE — ED Notes (Signed)
ECHO currently being obtained at bedside.

## 2015-05-27 DIAGNOSIS — E118 Type 2 diabetes mellitus with unspecified complications: Secondary | ICD-10-CM

## 2015-05-27 DIAGNOSIS — I2510 Atherosclerotic heart disease of native coronary artery without angina pectoris: Secondary | ICD-10-CM

## 2015-05-27 DIAGNOSIS — I5033 Acute on chronic diastolic (congestive) heart failure: Principal | ICD-10-CM

## 2015-05-27 DIAGNOSIS — I509 Heart failure, unspecified: Secondary | ICD-10-CM

## 2015-05-27 DIAGNOSIS — F419 Anxiety disorder, unspecified: Secondary | ICD-10-CM

## 2015-05-27 LAB — CBC
HCT: 30.7 % — ABNORMAL LOW (ref 39.0–52.0)
HEMOGLOBIN: 10.3 g/dL — AB (ref 13.0–17.0)
MCH: 30.7 pg (ref 26.0–34.0)
MCHC: 33.6 g/dL (ref 30.0–36.0)
MCV: 91.6 fL (ref 78.0–100.0)
PLATELETS: 155 10*3/uL (ref 150–400)
RBC: 3.35 MIL/uL — AB (ref 4.22–5.81)
RDW: 13.7 % (ref 11.5–15.5)
WBC: 6.2 10*3/uL (ref 4.0–10.5)

## 2015-05-27 LAB — BASIC METABOLIC PANEL
ANION GAP: 8 (ref 5–15)
BUN: 33 mg/dL — AB (ref 6–20)
CHLORIDE: 106 mmol/L (ref 101–111)
CO2: 26 mmol/L (ref 22–32)
Calcium: 8.7 mg/dL — ABNORMAL LOW (ref 8.9–10.3)
Creatinine, Ser: 1.65 mg/dL — ABNORMAL HIGH (ref 0.61–1.24)
GFR, EST AFRICAN AMERICAN: 51 mL/min — AB (ref >60)
GFR, EST NON AFRICAN AMERICAN: 44 mL/min — AB (ref >60)
Glucose, Bld: 134 mg/dL — ABNORMAL HIGH (ref 65–99)
POTASSIUM: 4.2 mmol/L (ref 3.5–5.1)
SODIUM: 140 mmol/L (ref 135–145)

## 2015-05-27 LAB — GLUCOSE, CAPILLARY
GLUCOSE-CAPILLARY: 108 mg/dL — AB (ref 65–99)
GLUCOSE-CAPILLARY: 130 mg/dL — AB (ref 65–99)

## 2015-05-27 MED ORDER — HYDRALAZINE HCL 50 MG PO TABS: 100.0000 mg | ORAL_TABLET | Freq: Two times a day (BID) | ORAL | Status: DC

## 2015-05-27 NOTE — Progress Notes (Signed)
Patient ID: Eugene [name], male   DOB: November 09, 1955, 59 y.o.   MRN: [id number]     Subjective:    SOB improved  Objective:   Temp:  [98.3 F (36.8 C)-98.9 F (37.2 C)] 98.9 F (37.2 C) (09/30 0530) Pulse Rate:  [67-89] 74 (09/30 0530) Resp:  [16-23] 19 (09/30 0530) BP: (120-212)/(59-100) 135/60 mmHg (09/30 0530) SpO2:  [90 %-100 %] 100 % (09/30 0530) Weight:  [263 lb (119.296 kg)-267 lb 4.8 oz (121.246 kg)] 267 lb 4.8 oz (121.246 kg) (09/29 1917)    Filed Weights   05/26/15 1117 05/26/15 1857 05/26/15 1917  Weight: 263 lb (119.296 kg) 267 lb 3 oz (121.195 kg) 267 lb 4.8 oz (121.246 kg)    Intake/Output Summary (Last 24 hours) at 05/27/15 0909 Last data filed at 05/27/15 0500  Gross per 24 hour  Intake      0 ml  Output    925 ml  Net   -925 ml    Telemetry: NSR  Exam:  General: NAD  Resp: CTAB  Cardiac: RRR, no m/r/g, no jvd  GI: abdomen soft, NT, ND  MSK: 1+ bilateral LE edema  Neuro: no focal deficits  Psych: appropriate affect  Lab Results:  Basic Metabolic Panel:  Recent Labs Lab 05/26/15 1125 05/27/15 0621  NA 137 140  K 4.3 4.2  CL 106 106  CO2 24 26  GLUCOSE 182* 134*  BUN 26* 33*  CREATININE 1.06 1.65*  CALCIUM 8.4* 8.7*    Liver Function Tests: No results for input(s): AST, ALT, ALKPHOS, BILITOT, PROT, ALBUMIN in the last 168 hours.  CBC:  Recent Labs Lab 05/26/15 1125 05/27/15 0621  WBC 5.5 6.2  HGB 10.8* 10.3*  HCT 31.7* 30.7*  MCV 90.6 91.6  PLT 159 155    Cardiac Enzymes:  Recent Labs Lab 05/26/15 1125  TROPONINI 0.03    BNP:  Recent Labs  07/03/14 1636  PROBNP 351.9*    Coagulation: No results for input(s): INR in the last 168 hours.  ECG:   Medications:   Scheduled Medications: . aspirin EC  81 mg Oral QHS  . clopidogrel  75 mg Oral QHS  . enoxaparin (LOVENOX) injection  40 mg Subcutaneous Q24H  . fenofibrate  160 mg Oral QHS  . hydrALAZINE  100 mg Oral BID  . Influenza vac split  quadrivalent PF  0.5 mL Intramuscular Tomorrow-1000  . insulin aspart  0-15 Units Subcutaneous TID WC  . insulin glargine  90 Units Subcutaneous QHS  . losartan  100 mg Oral Daily  . metoprolol tartrate  25 mg Oral BID  . sodium chloride  3 mL Intravenous Q12H  . sodium chloride  3 mL Intravenous Q12H     Infusions:     PRN Medications:  sodium chloride, acetaminophen **OR** acetaminophen, albuterol, ALPRAZolam, ondansetron **OR** ondansetron (ZOFRAN) IV, oxyCODONE, sodium chloride     Assessment/Plan    1. Acute on chronic diastolic HF - [id number] echo LVEF 60-65%, grade II diastolic dysfunction - likely exacerbated by severe HTN on admission with SBPs in 190s - received IV lasix 80mg  x1 yesterday, I/Os are incomplete. Cr has increased to 1.6. Looking back Cr very labile over the last several months ranging 1-1.8. - d/c IV lasix today, can resume his home torsemide tomorrow.   2. HTN - bp has normalized, we increased his hydralazine to 100mg  bid - continue current meds, due to labile Cr will not start thiazide diuretic as mentioned in prior note. Continue home  torsemide, bps seems to be controlled with increased hydyralzine dose.  - counseled if home bp's 160 or higher ok to take additional 50mg  of hydralazine.    3. Pericardial effusion - mild to moderate, overall stable compared to prior echo. No evidence of tamponade, this is not contributing to his SOB - continue to follow effusion, consider repeat echo 3-4 months.    Kennedy for discharge. We will arrange for f/u with NP Lawerence in 1-2 weeks, he will need BMET ordered at that time as well.     [name], M.D.

## 2015-05-27 NOTE — Addendum Note (Signed)
Addended by: Jone Baseman on: 05/27/2015 04:02 PM   Modules accepted: Orders

## 2015-05-27 NOTE — Discharge Summary (Signed)
Physician Discharge Summary  Eugene [name] Eugene [id number] DOB: 09/05/1955 DOA: 05/26/2015  PCP: [name], MD  Admit date: 05/26/2015 Discharge date: 05/27/2015  Time spent: 35 minutes  Recommendations for Outpatient Follow-up:  1. Follow up with Cardiology next week. 2. Follow up with your PCP in one week.    Discharge Diagnoses:  Principal Problem:   Pericardial effusion without cardiac tamponade Active Problems:   DM type 2 (diabetes mellitus, type 2)   Essential hypertension   CAD- MI/RCA PCI-DES x2 2012, and RCA DES Jan 2014   SOB (shortness of breath)   Acute on chronic diastolic heart failure   HLD (hyperlipidemia)   Anxiety   Discharge Condition: Slightly improved.   Diet recommendation: cardiac diet.    Filed Weights   05/26/15 1117 05/26/15 1857 05/26/15 1917  Weight: 119.296 kg (263 lb) 121.195 kg (267 lb 3 oz) 121.246 kg (267 lb 4.8 oz)    History of present illness: patient was admitted yesterday by Dr [name] for SOB.  He has a pericardial effusion.  As per his H and P: ' Patient is a 59 y.o. male  Shortness breath. Started 1-2 days ago. Constant. Getting worse. Gradual onset. Worsens with exertion. Socially with nonproductive intermittent cough. Denies any chest pain, palpitations, LOC, dizziness. Mild lower extremity swelling during this period time.  Partially 4 weeks ago patient states that he began exercising again approximately 5-6 times a week and dressed changed his diet. States that his blood glucose typically runs in the mid 100s.  Hospital Course: Patient was admitted into the hospital, and he was seen in consultation with cardiology Dr [name].  Dr [name] did not think he has tamponade, and that his pericardial effusion was about the same as previously seen.  He recommended that his BP be better controlled, and he was given increasing doses of Hydralazine.   He currently is on 100mg  BID, and his BP is better at this dose.  He was also given IV  Lasix, and will resume his home lasix dose tomorrow.  He also recommended that the ECHO be repeated in 3 to 4 months.  I spoke with patient about symptoms of tamponade, so that he can be aware of it, though I don'[name] think that this is going to be a problem.  He feels well, anxious to go home, and according to cardiology, stable for discharge.  He will see his PCP and his cardiologist in about a week for follow up.  Thank you for allwoing me to particiapte in his care.      Consultations:  Cardiology:  Dr [name].   Discharge Exam: Filed Vitals:   05/27/15 0530  BP: 135/60  Pulse: 74  Temp: 98.9 F (37.2 C)  Resp: 19       Discharge Instructions    Diet - low sodium heart healthy    Complete by:  As directed      Discharge instructions    Complete by:  As directed   Follow up with your PCP next week.  See your cardiologist as an appointment has been made for you.     Increase activity slowly    Complete by:  As directed           Current Discharge Medication List    CONTINUE these medications which have NOT CHANGED   Details  ALPRAZolam (XANAX) 0.5 MG tablet TAKE 1/2-1 TABLET BY MOUTH AT BEDTIME AS NEEDED Qty: 30 tablet, Refills: 3    aspirin EC  81 MG tablet Take 81 mg by mouth at bedtime.     BESIVANCE 0.6 % SUSP Place 1 drop into both eyes 2 (two) times daily. Refills: 12    clopidogrel (PLAVIX) 75 MG tablet TAKE 1 TABLET AT BEDTIME Qty: 90 tablet, Refills: 1    fenofibrate 160 MG tablet Take 1 tablet (160 mg total) by mouth at bedtime. Qty: 30 tablet, Refills: 5    hydrALAZINE (APRESOLINE) 100 MG tablet Take 1 tablet 100mg  by mouth 2 times daily. Qty: 60 tablet, Refills: 0    losartan (COZAAR) 100 MG tablet Take 1 tablet (100 mg total) by mouth daily. Qty: 90 tablet, Refills: 3    metoprolol tartrate (LOPRESSOR) 25 MG tablet Take 1 tablet (25 mg total) by mouth 2 (two) times daily. Qty: 180 tablet, Refills: 3    torsemide (DEMADEX) 20 MG tablet TAKE  1 TABLET EVERY MORNING  AND TAKE 1/2 TABLET EVERY EVENING Qty: 135 tablet, Refills: 1    albuterol (PROVENTIL HFA;VENTOLIN HFA) 108 (90 BASE) MCG/ACT inhaler Inhale 2 puffs into the lungs every 4 (four) hours as needed for shortness of breath. Qty: 1 Inhaler, Refills: 2      STOP taking these medications     Insulin Glargine (LANTUS SOLOSTAR) 100 UNIT/ML Solostar Pen      insulin lispro (HUMALOG KWIKPEN) 100 UNIT/ML KiwkPen      albuterol (PROVENTIL) (2.5 MG/3ML) 0.083% nebulizer solution      nitroGLYCERIN (NITROSTAT) 0.4 MG SL tablet        Allergies  Allergen Reactions  . Hydrocodone Nausea And Vomiting  . Lisinopril Cough  . Neurontin [Gabapentin] Other (See Comments)    Suicidal thoughts  . Statins Other (See Comments)    Muscle aches  . Metformin And Related Diarrhea and Other (See Comments)    Intestinal side effects  . Norvasc [Amlodipine Besylate] Swelling    Pedal edema   Follow-up Information    Follow up with [name], MD On 06/10/2015.   Specialty:  Cardiology   Why:  at 11:00 am in the [place] office   Contact information:   [address] [place] [address] [phone]        The results of significant diagnostics from this hospitalization (including imaging, microbiology, ancillary and laboratory) are listed below for reference.    Significant Diagnostic Studies: Dg Chest 2 View  05/26/2015   CLINICAL DATA:  Short of breath.  Onset of symptoms yesterday.  EXAM: CHEST  2 VIEW  COMPARISON:  08/28/2014.  12/25/2014.  07/02/2013.  FINDINGS: The cardiopericardial silhouette is enlarged. There is interstitial pulmonary edema on with her Awanda Mink B lines at the periphery. Basilar atelectasis. Monitoring leads project over the chest. No focal consolidation. No definite pleural effusion.  IMPRESSION: Mild CHF with interstitial edema, low volumes and basilar atelectasis.   Electronically Signed   By: Dereck Ligas M.D.   On: 05/26/2015 12:10    Ct Angio Chest Pe W/cm &/or Wo Cm  05/26/2015   CLINICAL DATA:  Nonproductive cough and shortness of breath for 2 days  EXAM: CT ANGIOGRAPHY CHEST WITH CONTRAST  TECHNIQUE: Multidetector CT imaging of the chest was performed using the standard protocol during bolus administration of intravenous contrast. Multiplanar CT image reconstructions and MIPs were obtained to evaluate the vascular anatomy.  CONTRAST:  174mL OMNIPAQUE IOHEXOL 350 MG/ML SOLN  COMPARISON:  Plain film from earlier in the same day  FINDINGS: The lungs are well aerated bilaterally without focal confluent infiltrate. Some  minimal basilar atelectasis is seen. Tiny small bilateral pleural effusions are noted. No pneumothorax is noted.  The thoracic inlet is within normal limits. The thoracic aorta is unremarkable without aneurysmal dilatation or dissection.  Pulmonary artery is well visualized and demonstrates a normal branching pattern. No filling defects to suggest pulmonary emboli are noted. No significant hilar or mediastinal adenopathy is noted. A few small less than 1 cm nodes are noted in the aorticopulmonary window. A pericardial effusion is noted which measures approximately 2 cm in greatest dimension along the posterior aspect of the left ventricle.  The visualized upper abdomen is within normal limits. The osseous structures are within normal limits with evidence of mild degenerative change of the thoracic spine.  Review of the MIP images confirms the above findings.  IMPRESSION: Pericardial effusion.  No evidence of pulmonary embolism.  Tiny small effusions.   Electronically Signed   By: Inez Catalina M.D.   On: 05/26/2015 14:39    Labs: Basic Metabolic Panel:  Recent Labs Lab 05/26/15 1125 05/27/15 0621  NA 137 140  K 4.3 4.2  CL 106 106  CO2 24 26  GLUCOSE 182* 134*  BUN 26* 33*  CREATININE 1.06 1.65*  CALCIUM 8.4* 8.7*   Liver Function Tests: No results for input(s): AST, ALT, ALKPHOS, BILITOT, PROT, ALBUMIN in  the last 168 hours. No results for input(s): LIPASE, AMYLASE in the last 168 hours. No results for input(s): AMMONIA in the last 168 hours. CBC:  Recent Labs Lab 05/26/15 1125 05/27/15 0621  WBC 5.5 6.2  NEUTROABS 3.4  --   HGB 10.8* 10.3*  HCT 31.7* 30.7*  MCV 90.6 91.6  PLT 159 155   Cardiac Enzymes:  Recent Labs Lab 05/26/15 1125  TROPONINI 0.03     Recent Labs  08/28/14 2015 12/25/14 1305 05/26/15 1125  BNP 62.0 72.0 120.0*    Recent Labs  07/03/14 1636  PROBNP 351.9*    CBG:  Recent Labs Lab 05/26/15 2118 05/27/15 1115  GLUCAP 123* 130*    Signed:  LE,PETER  [facility] 05/27/2015, 1:11 PM

## 2015-05-27 NOTE — Care Management Important Message (Signed)
Important Message  Patient Details  Name: Eugene [name] MRN: [id number] Date of Birth: 05/04/1956   Medicare Important Message Given:  Yes-second notification given    Joylene Draft, RN 05/27/2015, 9:04 AM

## 2015-05-27 NOTE — Care Management Note (Signed)
Case Management Note  Patient Details  Name: SANTIEL HOCKEY MRN: [id number] Date of Birth: 1955/10/25  Subjective/Objective:                    Action/Plan:   Expected Discharge Date:  05/30/15               Expected Discharge Plan:  Home/Self Care  In-House Referral:  NA  Discharge planning Services  CM Consult  Post Acute Care Choice:  NA Choice offered to:  NA  DME Arranged:    DME Agency:     HH Arranged:    [place] Agency:     Status of Service:  Completed, signed off  Medicare Important Message Given:  Yes-second notification given Date Medicare IM Given:    Medicare IM give by:    Date Additional Medicare IM Given:    Additional Medicare Important Message give by:     If discussed at [place] of Stay Meetings, dates discussed:    Additional Comments: Pt discharged home today. No CM needs noted. Christinia Gully [place], RN 05/27/2015, 1:21 PM

## 2015-05-27 NOTE — Care Management Note (Addendum)
Case Management Note  Patient Details  Name: Eugene [name] MRN: [id number] Date of Birth: October 01, 1955  Subjective/Objective:                  Pt admitted from home with pericardial effusion and SOB. Pt lives with his wife and will return home at discharge. Pt is independent with ADL's. Pt has a neb machine for home use.  Action/Plan: No CM needs anticipated. If pt discharges over the weekend and any discharge needs noted, weekend staff can arrange.  Expected Discharge Date:  05/30/15               Expected Discharge Plan:  Home/Self Care  In-House Referral:  NA  Discharge planning Services  CM Consult  Post Acute Care Choice:  NA Choice offered to:  NA  DME Arranged:    DME Agency:     HH Arranged:    HH Agency:     Status of Service:  Completed, signed off  Medicare Important Message Given:  Yes-second notification given Date Medicare IM Given:    Medicare IM give by:    Date Additional Medicare IM Given:    Additional Medicare Important Message give by:     If discussed at [place] of Stay Meetings, dates discussed:    Additional Comments:  Joylene Draft, RN 05/27/2015, 9:04 AM

## 2015-05-27 NOTE — Progress Notes (Signed)
Pt being discharged home, vitals stable, no c/o pain, all discharge paperwork sent with pt no questions for the nurse

## 2015-05-27 NOTE — Patient Outreach (Signed)
[facility]) Care Management  05/27/2015  [place] 1956/07/03 [id number]   Patient admitted to [facility] on 05-26-15.  Hospital Team made aware of admit and will try to see patient in the hospital if patient has not been discharged.    Jone Baseman, RN, MSN [facility] [phone]

## 2015-05-30 ENCOUNTER — Other Ambulatory Visit: Payer: Self-pay | Admitting: *Deleted

## 2015-05-30 NOTE — Patient Outreach (Signed)
[place] [facility]) Care Management  05/30/2015  [place] 01-14-1956 [id number]   Request from Jon Billings, RN to assign Community RN for Transition of Care calls, assigned Janalyn Shy, Therapist, sports.  Thanks, Ronnell Freshwater. [place], [place] Assistant Phone: [phone] Fax: [phone]

## 2015-05-31 ENCOUNTER — Ambulatory Visit: Payer: Commercial Managed Care - HMO

## 2015-06-03 NOTE — Patient Outreach (Signed)
[facility]) Care Management  06/03/2015  [place] Ellick Mitchelle November 19, 1955 [id number]   Patient assistance application faxed to [facility] for processing of assistance with Lantus medication.  Lakina Mcintire L. Kalima Saylor, [place] Care Management Assistant

## 2015-06-10 ENCOUNTER — Encounter: Payer: Self-pay | Admitting: *Deleted

## 2015-06-10 ENCOUNTER — Encounter: Payer: Commercial Managed Care - HMO | Admitting: Cardiology

## 2015-06-10 ENCOUNTER — Encounter: Payer: Self-pay | Admitting: Cardiovascular Disease

## 2015-06-10 ENCOUNTER — Ambulatory Visit (INDEPENDENT_AMBULATORY_CARE_PROVIDER_SITE_OTHER): Payer: Commercial Managed Care - HMO | Admitting: Cardiovascular Disease

## 2015-06-10 ENCOUNTER — Encounter: Payer: Commercial Managed Care - HMO | Admitting: Adult Health

## 2015-06-10 VITALS — BP 140/88 | HR 70 | Ht 73.0 in | Wt 277.0 lb

## 2015-06-10 DIAGNOSIS — I319 Disease of pericardium, unspecified: Secondary | ICD-10-CM

## 2015-06-10 DIAGNOSIS — I5032 Chronic diastolic (congestive) heart failure: Secondary | ICD-10-CM

## 2015-06-10 DIAGNOSIS — Z87898 Personal history of other specified conditions: Secondary | ICD-10-CM

## 2015-06-10 DIAGNOSIS — I10 Essential (primary) hypertension: Secondary | ICD-10-CM

## 2015-06-10 DIAGNOSIS — R600 Localized edema: Secondary | ICD-10-CM

## 2015-06-10 DIAGNOSIS — N183 Chronic kidney disease, stage 3 unspecified: Secondary | ICD-10-CM

## 2015-06-10 DIAGNOSIS — R0602 Shortness of breath: Secondary | ICD-10-CM

## 2015-06-10 DIAGNOSIS — Z9289 Personal history of other medical treatment: Secondary | ICD-10-CM

## 2015-06-10 DIAGNOSIS — I252 Old myocardial infarction: Secondary | ICD-10-CM

## 2015-06-10 DIAGNOSIS — Z955 Presence of coronary angioplasty implant and graft: Secondary | ICD-10-CM

## 2015-06-10 DIAGNOSIS — G4733 Obstructive sleep apnea (adult) (pediatric): Secondary | ICD-10-CM

## 2015-06-10 DIAGNOSIS — I3139 Other pericardial effusion (noninflammatory): Secondary | ICD-10-CM

## 2015-06-10 DIAGNOSIS — E782 Mixed hyperlipidemia: Secondary | ICD-10-CM

## 2015-06-10 DIAGNOSIS — I2510 Atherosclerotic heart disease of native coronary artery without angina pectoris: Secondary | ICD-10-CM

## 2015-06-10 DIAGNOSIS — I313 Pericardial effusion (noninflammatory): Secondary | ICD-10-CM

## 2015-06-10 MED ORDER — AZILSARTAN MEDOXOMIL 80 MG PO TABS: 80.0000 mg | ORAL_TABLET | Freq: Every day | ORAL | Status: DC

## 2015-06-10 MED ORDER — TORSEMIDE 20 MG PO TABS: 40.0000 mg | ORAL_TABLET | Freq: Every day | ORAL | Status: DC

## 2015-06-10 MED ORDER — HYDRALAZINE HCL 50 MG PO TABS: 50.0000 mg | ORAL_TABLET | Freq: Two times a day (BID) | ORAL | Status: DC

## 2015-06-10 NOTE — Progress Notes (Signed)
Patient ID: Eugene [name], male   DOB: 23-Sep-1955, 59 y.o.   MRN: [id number]      SUBJECTIVE: The patient presents for post hospitalization follow-up. When I saw him on 8/17 he had been doing well. He was recently hospitalized for acute on chronic diastolic heart failure likely exacerbated by severe hypertension with an admission systolic blood pressure in the 190 range. He had a labile creatinine (1.65 on 9/30) and thus a thiazide diuretic was not initiated. Hydralazine dose was increased to 100 mg bid. Takes torsemide 40 mg daily.   Echocardiogram on 9 /29/16 demonstrated normal left ventricular systolic function and regional wall motion, LVEF 60-65%, severe LVH, grade 2 diastolic dysfunction, moderate left atrial and mild right atrial dilatation, dilated IVC , and a mild to moderate circumferential pericardial effusion with no evidence of tamponade physiology.  He continues to feel short of breath. He works out at [employer] regularly swimming for at least one hour and also walking a mile. Spirometry in January 2016 showed a mild restrictive process but no clear-cut evidence of COPD. CT angiography of the chest on 05/26/15 showed no evidence of pulmonary embolism.   Taking 100 mg twice daily of hydralazine made him feel sluggish and he cut back on his own to 50 mg twice daily. He is taking 40 mg daily of torsemide. He thinks the losartan is causing him to be short of breath.   Review of Systems: As per "subjective", otherwise negative.  Allergies  Allergen Reactions  . Hydrocodone Nausea And Vomiting  . Lisinopril Cough  . Neurontin [Gabapentin] Other (See Comments)    Suicidal thoughts  . Statins Other (See Comments)    Muscle aches  . Metformin And Related Diarrhea and Other (See Comments)    Intestinal side effects  . Norvasc [Amlodipine Besylate] Swelling    Pedal edema    Current Outpatient Prescriptions  Medication Sig Dispense Refill  . albuterol (PROVENTIL HFA;VENTOLIN  HFA) 108 (90 BASE) MCG/ACT inhaler Inhale 2 puffs into the lungs every 4 (four) hours as needed for shortness of breath. 1 Inhaler 2  . ALPRAZolam (XANAX) 0.5 MG tablet TAKE 1/2-1 TABLET BY MOUTH AT BEDTIME AS NEEDED 30 tablet 3  . aspirin EC 81 MG tablet Take 81 mg by mouth at bedtime.     [name] BESIVANCE 0.6 % SUSP Place 1 drop into both eyes 2 (two) times daily.  12  . clopidogrel (PLAVIX) 75 MG tablet TAKE 1 TABLET AT BEDTIME 90 tablet 1  . esomeprazole (NEXIUM) 20 MG packet Take 20 mg by mouth as needed.    . fenofibrate 160 MG tablet Take 1 tablet (160 mg total) by mouth at bedtime. 30 tablet 5  . hydrALAZINE (APRESOLINE) 50 MG tablet Take 2 tablets (100 mg total) by mouth 2 (two) times daily. 90 tablet 0  . losartan (COZAAR) 100 MG tablet Take 1 tablet (100 mg total) by mouth daily. 90 tablet 3  . metoprolol tartrate (LOPRESSOR) 25 MG tablet Take 1 tablet (25 mg total) by mouth 2 (two) times daily. 180 tablet 3  . torsemide (DEMADEX) 20 MG tablet Take 20 mg by mouth daily. 2 tabs in AM     No current facility-administered medications for this visit.    Past Medical History  Diagnosis Date  . Arteriosclerotic cardiovascular disease (ASCVD)     a. 05/2011 Cath/PCI: LM nl, LAD 22m, D1 small, D2 small 12m, LCX large 40p, RCA 50-60p, 99 hazy @ origin of PDA with 70-80  in PDA (2.5x26 Resolute Integrity & 3.0x15 Resolute Integrity DES).;  b. 08/2012 Inflat  STEMI/Cath/PCI: LM minor irregs, LAD 50p, D1 50, LCX nl, OM1 25, RCA 30-40p, 100d (treated with 2.75x49mm Promus Premier DES);  c. 08/2012 Echo: EF 55-60%, basal inferopost HK.  [name] Hyperlipidemia   . Diabetes mellitus     Peripheral neuropathy  . Bell palsy   . Hypertension   . COPD (chronic obstructive pulmonary disease) ([place])   . Sleep apnea   . Gallstones   . Cholelithiasis 07/2012    Asymptomatic; identified incidentally  . PONV (postoperative nausea and vomiting)   . Anxiety   . C. difficile colitis     a. 08/2012  . Myocardial  infarct ([place]) 09/08/12  . Nephrolithiasis   . Contrast dye induced nephropathy     a. 08/2012 post cath/pci  . CHF (congestive heart failure) ([place])   . Asthma   . Heart disease   . Old myocardial infarction   . Anginal pain ([place])   . GERD (gastroesophageal reflux disease)     Past Surgical History  Procedure Laterality Date  . Circumcision    . Stents    . Esophagogastroduodenoscopy      in [place] over 20 yrs ago  . Colonoscopy      In [facility], approximately 2011 per patient, was normal. Advised to come back in 10 years.  . Esophagogastroduodenoscopy  06/12/2012    [id number] esophagus-status post Venia Minks dilation. Abnormal gastric mucosa of uncertain significance-status post biopsy  . Cardiac catheterization    . Subxyphoid pericardial window N/A 06/23/2013    Procedure: SUBXYPHOID PERICARDIAL WINDOW;  Surgeon: Grace Isaac, MD;  Location: [place];  Service: Thoracic;  Laterality: N/A;  . Intraoperative transesophageal echocardiogram N/A 06/23/2013    Procedure: INTRAOPERATIVE TRANSESOPHAGEAL ECHOCARDIOGRAM;  Surgeon: Grace Isaac, MD;  Location: [place];  Service: Open Heart Surgery;  Laterality: N/A;  . Left heart catheterization with coronary angiogram N/A 09/08/2012    Procedure: LEFT HEART CATHETERIZATION WITH CORONARY ANGIOGRAM;  Surgeon: [name], MD;  Location: [facility] CATH LAB;  Service: Cardiovascular;  Laterality: N/A;  . Chest tube insertion      Social History   Social History  . Marital Status: Married    Spouse Name: N/A  . Number of Children: 2  . Years of Education: N/A   Occupational History  . Shipping     [employer]   Social History Main Topics  . Smoking status: Never Smoker   . Smokeless tobacco: Never Used  . Alcohol Use: No     Comment: heavy etoh use 30 years ago  . Drug Use: No  . Sexual Activity: Yes    Birth Control/ Protection: None   Other Topics Concern  . 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 Disabled at this point.     Filed Vitals:   06/10/15 1444  BP: 140/88  Pulse: 70  Height: 6\' 1"  (1.854 m)  Weight: 277 lb (125.646 kg)  SpO2: 95%    PHYSICAL EXAM General: NAD HEENT: Normal. Neck: No JVD, no thyromegaly. Lungs: Clear to auscultation bilaterally with normal respiratory effort. CV: Nondisplaced PMI. Regular rate and rhythm, normal S1/S2, no S3/S4, no murmur. Trace pretibial and periankle edema.  Abdomen: Firm, obese, nontender.  Neurologic: Alert and oriented x 3.  Psych: Normal affect. Skin: Normal. Musculoskeletal: Normal range of motion, no gross deformities. Extremities: No clubbing  or cyanosis.   ECG: Most recent ECG reviewed.    ASSESSMENT AND PLAN: 1. CAD s/p multiple PCI's to RCA: Stable ischemic heart disease. Continue aspirin, metoprolol, and Plavix. Not amenable to statin therapy. He would prefer to hold off on Zetia and wants to continue to pursue therapeutic lifestyle changes.   2. Malignant HTN: Borderline elevated on hydralazine 50 mg bid along with losartan 100 mg daily. Given his concerns and suboptimal BP control (remains elevated to 160-170 range at home), will d/c losartan and start Edarbi 80 mg daily. If remains elevated in a month, would consider addition of clonidine at night given that he was unable to tolerate higher doses of hydralazine.  3. Chronic diastolic heart failure: Euvolemic. Continue low-sodium and fluid-restricted diet. Continue compression stocking use. BP control of prime importance.  4. Mixed dyslipidemia: Markedly elevated lipids on 01/18/15 in context of diabetes and CAD. Intolerant to statins (muscle aches). On fenofibrate. He would prefer to hold off on Zetia and wants to continue to pursue therapeutic lifestyle changes. Will obtain copy of most recent lipids.  5. Pericardial effusion s/p pericardial window: Small to medium in size and stable in 04/2015. Continue  to monitor.   6. Shortness of breath: Likely due to suboptimal BP control in setting of grade 2 diastolic dysfunction. If BP becomes optimally controlled and he remains short of breath, would consider right and left heart catheterization.  Dispo: f/u 1 month.  Time spent: 40 minutes, of which greater than 50% was spent reviewing symptoms, relevant blood tests and studies, and discussing management plan with the patient.   Kate Sable, M.D., F.A.C.C.

## 2015-06-10 NOTE — Patient Instructions (Signed)
Your physician has recommended you make the following change in your medication:  Stop losartan. Start edarbi 80 mg daily. This medication is $10-$25 per month at [facility] in [place] based on your qualifications. Continue all other medications the same. Your physician recommends that you schedule a follow-up appointment in: 1 month.

## 2015-06-16 ENCOUNTER — Other Ambulatory Visit: Payer: Self-pay | Admitting: *Deleted

## 2015-06-16 ENCOUNTER — Telehealth: Payer: Self-pay | Admitting: Cardiovascular Disease

## 2015-06-16 MED ORDER — AZILSARTAN MEDOXOMIL 80 MG PO TABS: 80.0000 mg | ORAL_TABLET | Freq: Every day | ORAL | Status: DC

## 2015-06-16 NOTE — Telephone Encounter (Signed)
[name] called stating that [employer] called today saying the Azilsartan Medoxomil (EDARBI) 73 MG TABS Was going to be $245.00. He can not afford this medication. Please call patient and advise.

## 2015-06-16 NOTE — Telephone Encounter (Signed)
Pt called [employer] today. Spoke with pt and sent medication to [facility] so that pt may qualify for $10 for 3 month supply. To be determined after qualification of deal at [facility] if Eugene [name] is needed for [employer]

## 2015-06-16 NOTE — Telephone Encounter (Signed)
Does this medication require a prior authorization? If so please forward information to [facility], LPN. She handles these requests.

## 2015-06-17 ENCOUNTER — Other Ambulatory Visit: Payer: Self-pay | Admitting: *Deleted

## 2015-06-22 ENCOUNTER — Encounter (INDEPENDENT_AMBULATORY_CARE_PROVIDER_SITE_OTHER): Payer: Commercial Managed Care - HMO | Admitting: Ophthalmology

## 2015-06-22 DIAGNOSIS — E11311 Type 2 diabetes mellitus with unspecified diabetic retinopathy with macular edema: Secondary | ICD-10-CM | POA: Diagnosis not present

## 2015-06-22 DIAGNOSIS — H35033 Hypertensive retinopathy, bilateral: Secondary | ICD-10-CM

## 2015-06-22 DIAGNOSIS — E113513 Type 2 diabetes mellitus with proliferative diabetic retinopathy with macular edema, bilateral: Secondary | ICD-10-CM

## 2015-06-22 DIAGNOSIS — I10 Essential (primary) hypertension: Secondary | ICD-10-CM

## 2015-06-22 DIAGNOSIS — H2513 Age-related nuclear cataract, bilateral: Secondary | ICD-10-CM

## 2015-06-22 DIAGNOSIS — H43813 Vitreous degeneration, bilateral: Secondary | ICD-10-CM

## 2015-06-23 ENCOUNTER — Other Ambulatory Visit: Payer: Self-pay | Admitting: *Deleted

## 2015-06-23 NOTE — Patient Outreach (Signed)
Eugene [name]) Care Management  06/23/2015  [place] Jan 09, 1956 [id number]  I called Eugene [name] today for transition of care call #3. He answered (was unavailable last week) and we discussed his progress and current status. He told me that he'd "just got off the plan" before he was admitted to the hospital overnight from 9/29 to 9/30 for volume overload. He was diuresed overnight and discharged to home. Eugene [name] says he resumed checking his vital signs daily and checking his weight regularly at the [facility]. Prior to his hospital admission he was swimming at the [facility] 2-3 days/week and participating in an active walking program, logging up to 4 miles/week.   Eugene [name] has been actively working with our pharmacy team for medication assistance with insulin.   Today, Eugene [name] stated his weight and bp were stable "around the 130's and 140's over 80's" and his weight stays within 2 pounds of 277 over the last 2 weeks. He reported cbg's between 100 and 125 over the last week.   Eugene [name] says he doesn't feel he needs ongoing telephonic case management and he declined a home visit. I recommended that he call his provider with any new or worsened symptoms. Eugene [name] is still being actively followed by [place]. If he needs nursing services in the future, Eugene [name] has the main [facility] office number and my number.    [place] Management  [phone]

## 2015-06-30 ENCOUNTER — Other Ambulatory Visit: Payer: Self-pay

## 2015-06-30 NOTE — Patient Outreach (Signed)
[facility]) Care Management  06/30/2015  [place] 06-25-1956 [id number]   Telephone outreach to patient to check the status of the patient assistance application submitted. Patient states he was approved by [employer] and he picked his medication up from his physician's office. I informed patient if he needed assistance with reapplying for next year, I would be more than happy to assist. I am removing myself from the care team.  Di Jasmer L. Ambriel Gorelick, [place] Care Management Assistant

## 2015-06-30 NOTE — Patient Outreach (Signed)
Eugene [name] has received his medications from the patient assistance program.  He does not have any other pharmacy needs.  I will close him out to pharmacy.    Deanne Coffer, PharmD, [place] [phone]

## 2015-07-03 ENCOUNTER — Encounter (HOSPITAL_COMMUNITY): Payer: Self-pay | Admitting: Emergency Medicine

## 2015-07-03 ENCOUNTER — Emergency Department (HOSPITAL_COMMUNITY): Payer: Commercial Managed Care - HMO

## 2015-07-03 ENCOUNTER — Emergency Department (HOSPITAL_COMMUNITY)
Admission: EM | Admit: 2015-07-03 | Discharge: 2015-07-03 | Disposition: A | Discharge status: Home / Self Care | Payer: Commercial Managed Care - HMO | Attending: Emergency Medicine | Admitting: Emergency Medicine

## 2015-07-03 DIAGNOSIS — I509 Heart failure, unspecified: Secondary | ICD-10-CM | POA: Insufficient documentation

## 2015-07-03 DIAGNOSIS — M25431 Effusion, right wrist: Secondary | ICD-10-CM

## 2015-07-03 DIAGNOSIS — M25531 Pain in right wrist: Secondary | ICD-10-CM | POA: Diagnosis not present

## 2015-07-03 DIAGNOSIS — I25119 Atherosclerotic heart disease of native coronary artery with unspecified angina pectoris: Secondary | ICD-10-CM | POA: Insufficient documentation

## 2015-07-03 DIAGNOSIS — J449 Chronic obstructive pulmonary disease, unspecified: Secondary | ICD-10-CM | POA: Insufficient documentation

## 2015-07-03 DIAGNOSIS — Z79899 Other long term (current) drug therapy: Secondary | ICD-10-CM | POA: Diagnosis not present

## 2015-07-03 DIAGNOSIS — K219 Gastro-esophageal reflux disease without esophagitis: Secondary | ICD-10-CM | POA: Diagnosis not present

## 2015-07-03 DIAGNOSIS — I252 Old myocardial infarction: Secondary | ICD-10-CM | POA: Diagnosis not present

## 2015-07-03 DIAGNOSIS — E1140 Type 2 diabetes mellitus with diabetic neuropathy, unspecified: Secondary | ICD-10-CM | POA: Diagnosis not present

## 2015-07-03 DIAGNOSIS — R2231 Localized swelling, mass and lump, right upper limb: Secondary | ICD-10-CM | POA: Insufficient documentation

## 2015-07-03 DIAGNOSIS — Z8619 Personal history of other infectious and parasitic diseases: Secondary | ICD-10-CM | POA: Diagnosis not present

## 2015-07-03 DIAGNOSIS — Z87442 Personal history of urinary calculi: Secondary | ICD-10-CM | POA: Diagnosis not present

## 2015-07-03 DIAGNOSIS — M79641 Pain in right hand: Secondary | ICD-10-CM | POA: Diagnosis not present

## 2015-07-03 DIAGNOSIS — Z7982 Long term (current) use of aspirin: Secondary | ICD-10-CM | POA: Insufficient documentation

## 2015-07-03 LAB — SYNOVIAL CELL COUNT + DIFF, W/ CRYSTALS
EOSINOPHILS-SYNOVIAL: 0 % (ref 0–1)
LYMPHOCYTES-SYNOVIAL FLD: 17 % (ref 0–20)
MONOCYTE-MACROPHAGE-SYNOVIAL FLUID: 4 % — AB (ref 50–90)
Neutrophil, Synovial: 79 % — ABNORMAL HIGH (ref 0–25)
OTHER CELLS-SYN: 0
WBC, Synovial: 1150 /mm3 — ABNORMAL HIGH (ref 0–200)

## 2015-07-03 LAB — GRAM STAIN

## 2015-07-03 MED ORDER — HYDROCODONE-ACETAMINOPHEN 5-325MG PREPACK (~~LOC~~: 6.0000 | ORAL_TABLET | Freq: Once | ORAL | Status: DC

## 2015-07-03 MED ORDER — HYDROCODONE-ACETAMINOPHEN 5-325 MG PO TABS: 2.0000 | ORAL_TABLET | ORAL | Status: DC | PRN

## 2015-07-03 MED ORDER — LIDOCAINE-EPINEPHRINE (PF) 1 %-1:200000 IJ SOLN
10.0000 mL | Freq: Once | INTRAMUSCULAR | Status: DC
  Filled 2015-07-03: qty 10

## 2015-07-03 NOTE — Discharge Instructions (Signed)
Follow-up closely with orthopedic specialist and discuss further evaluation and results of needle aspiration. If you were given medicines take as directed.  If you are on coumadin or contraceptives realize their levels and effectiveness is altered by many different medicines.  If you have any reaction (rash, tongues swelling, other) to the medicines stop taking and see a physician.    If your blood pressure was elevated in the ER make sure you follow up for management with a primary doctor or return for chest pain, shortness of breath or stroke symptoms.  Please follow up as directed and return to the ER or see a physician for new or worsening symptoms.  Thank you. Filed Vitals:   07/03/15 1608  BP: 158/88  Pulse: 83  Temp: 97.9 F (36.6 C)  TempSrc: Oral  Resp: 16  SpO2: 96%

## 2015-07-03 NOTE — ED Provider Notes (Signed)
CSN: [id number]     Arrival date & time 07/03/15  1603 History   First MD Initiated Contact with Patient 07/03/15 1612     Chief Complaint  Patient presents with  . Hand Pain     (Consider location/radiation/quality/duration/timing/severity/associated sxs/prior Treatment) HPI Comments: 59 year old male with history of heart failure, diabetes, CAD, neuropathy, reflux, anemia presents for worsening right wrist pain and swelling since Thursday. Patient tried Tylenol, ice heat with no improvement. No history of joint diseases. No injuries. No fevers chills or vomiting. No history of rheumatoid arthritis gout or other joint disease. Pain with range of motion.  Patient is a 59 y.o. male presenting with hand pain. The history is provided by the patient.  Hand Pain Pertinent negatives include no chest pain, no abdominal pain, no headaches and no shortness of breath.    Past Medical History  Diagnosis Date  . Arteriosclerotic cardiovascular disease (ASCVD)     a. 05/2011 Cath/PCI: LM nl, LAD 45m, D1 small, D2 small 58m, LCX large 40p, RCA 50-60p, 99 hazy @ origin of PDA with 70-80 in PDA (2.5x26 Resolute Integrity & 3.0x15 Resolute Integrity DES).;  b. 08/2012 Inflat  STEMI/Cath/PCI: LM minor irregs, LAD 50p, D1 50, LCX nl, OM1 25, RCA 30-40p, 100d (treated with 2.75x36mm Promus Premier DES);  c. 08/2012 Echo: EF 55-60%, basal inferopost HK.  [name] Hyperlipidemia   . Diabetes mellitus     Peripheral neuropathy  . Bell palsy   . Hypertension   . COPD (chronic obstructive pulmonary disease) ([place])   . Sleep apnea   . Gallstones   . Cholelithiasis 07/2012    Asymptomatic; identified incidentally  . PONV (postoperative nausea and vomiting)   . Anxiety   . C. difficile colitis     a. 08/2012  . Myocardial infarct ([place]) 09/08/12  . Nephrolithiasis   . Contrast dye induced nephropathy     a. 08/2012 post cath/pci  . CHF (congestive heart failure) ([place])   . Asthma   . Heart disease   . Old myocardial  infarction   . Anginal pain ([place])   . GERD (gastroesophageal reflux disease)    Past Surgical History  Procedure Laterality Date  . Circumcision    . Stents    . Esophagogastroduodenoscopy      in [place] over 20 yrs ago  . Colonoscopy      In [facility], approximately 2011 per patient, was normal. Advised to come back in 10 years.  . Esophagogastroduodenoscopy  06/12/2012    [id number] esophagus-status post Venia Minks dilation. Abnormal gastric mucosa of uncertain significance-status post biopsy  . Cardiac catheterization    . Subxyphoid pericardial window N/A 06/23/2013    Procedure: SUBXYPHOID PERICARDIAL WINDOW;  Surgeon: Grace Isaac, MD;  Location: [place];  Service: Thoracic;  Laterality: N/A;  . Intraoperative transesophageal echocardiogram N/A 06/23/2013    Procedure: INTRAOPERATIVE TRANSESOPHAGEAL ECHOCARDIOGRAM;  Surgeon: Grace Isaac, MD;  Location: [place];  Service: Open Heart Surgery;  Laterality: N/A;  . Left heart catheterization with coronary angiogram N/A 09/08/2012    Procedure: LEFT HEART CATHETERIZATION WITH CORONARY ANGIOGRAM;  Surgeon: [name], MD;  Location: [facility] CATH LAB;  Service: Cardiovascular;  Laterality: N/A;  . Chest tube insertion     Family History  Problem Relation Age of Onset  . Diabetes Mother   . Heart attack Mother   . Stroke Mother   . Diabetes Sister   . Sleep apnea Sister   . Hypertension Brother   .  Diabetes Brother   . Colon cancer Neg Hx   . Liver disease Neg Hx   . Diabetes Brother   . Hypertension Brother    Social History  Substance Use Topics  . Smoking status: Never Smoker   . Smokeless tobacco: Never Used  . Alcohol Use: No     Comment: heavy etoh use 30 years ago    Review of Systems  Constitutional: Negative for fever and chills.  HENT: Negative for congestion.   Eyes: Negative for visual disturbance.  Respiratory: Negative for shortness of breath.   Cardiovascular: Negative for chest pain.   Gastrointestinal: Negative for vomiting and abdominal pain.  Genitourinary: Negative for dysuria and flank pain.  Musculoskeletal: Positive for joint swelling. Negative for back pain, neck pain and neck stiffness.  Skin: Negative for rash.  Neurological: Negative for light-headedness and headaches.      Allergies  Hydrocodone; Lisinopril; Neurontin; Statins; Metformin and related; and Norvasc  Home Medications   Prior to Admission medications   Medication Sig Start Date End Date Taking? Authorizing Provider  albuterol (PROVENTIL HFA;VENTOLIN HFA) 108 (90 BASE) MCG/ACT inhaler Inhale 2 puffs into the lungs every 4 (four) hours as needed for shortness of breath. 09/28/14   [name], MD  ALPRAZolam Duanne Moron) 0.5 MG tablet TAKE 1/2-1 TABLET BY MOUTH AT BEDTIME AS NEEDED 03/25/15   Mikey Kirschner, MD  aspirin EC 81 MG tablet Take 81 mg by mouth at bedtime.     Historical Provider, MD  Azilsartan Medoxomil (EDARBI) 80 MG TABS Take 1 tablet (80 mg total) by mouth daily. 06/16/15   Herminio Commons, MD  BESIVANCE 0.6 % SUSP Place 1 drop into both eyes 2 (two) times daily. 04/27/15   Historical Provider, MD  clopidogrel (PLAVIX) 75 MG tablet TAKE 1 TABLET AT BEDTIME 03/31/15   [name], MD  esomeprazole (NEXIUM) 20 MG packet Take 20 mg by mouth as needed.    Historical Provider, MD  fenofibrate 160 MG tablet Take 1 tablet (160 mg total) by mouth at bedtime. 12/08/14   [name], MD  hydrALAZINE (APRESOLINE) 50 MG tablet Take 1 tablet (50 mg total) by mouth 2 (two) times daily. 06/10/15   Herminio Commons, MD  HYDROcodone-acetaminophen (VICODIN) 5-325 mg TABS tablet Take 6 tablets by mouth once. 07/03/15   Elnora Morrison, MD  metoprolol tartrate (LOPRESSOR) 25 MG tablet Take 1 tablet (25 mg total) by mouth 2 (two) times daily. 04/06/15   [name], MD  torsemide (DEMADEX) 20 MG tablet Take 2 tablets (40 mg total) by mouth daily. 06/10/15   Herminio Commons, MD   BP 158/88  mmHg  Pulse 83  Temp(Src) 97.9 F (36.6 C) (Oral)  Resp 16  SpO2 96% Physical Exam  Constitutional: He is oriented to person, place, and time. He appears well-developed and well-nourished.  HENT:  Head: Normocephalic and atraumatic.  Eyes: Conjunctivae are normal. Right eye exhibits no discharge. Left eye exhibits no discharge.  Neck: Normal range of motion. Neck supple. No tracheal deviation present.  Cardiovascular: Normal rate.   Pulmonary/Chest: Effort normal.  Abdominal: Soft.  Musculoskeletal: He exhibits edema and tenderness.  Patient is mild to moderate swelling and tenderness right wrist, minimal range of motion with flexion extension due to pain. Mild warmth. No erythema. Neurovascular intact distally and good cap refill. Soft compartments in the forearm.  Neurological: He is alert and oriented to person, place, and time.  Skin: Skin is warm. No  rash noted.  Psychiatric: He has a normal mood and affect.  Nursing note and vitals reviewed.   ED Course  .Joint Aspiration/Arthrocentesis Date/Time: 07/03/2015 4:55 PM Performed by: Elnora Morrison Authorized by: Elnora Morrison Consent: Verbal consent obtained. Risks and benefits: risks, benefits and alternatives were discussed Consent given by: patient Patient understanding: patient states understanding of the procedure being performed Patient consent: the patient's understanding of the procedure matches consent given Patient identity confirmed: verbally with patient Indications: joint swelling,  pain,  possible septic joint and diagnostic evaluation  Body area: wrist Joint: right wrist Local anesthesia used: yes Local anesthetic: lidocaine 1% with epinephrine Anesthetic total: 2 ml Patient sedated: no Needle gauge: 20 G Ultrasound guidance: yes Approach: anterior Aspirate: yellow Patient tolerance: Patient tolerated the procedure well with no immediate complications   (including critical care time) EMERGENCY  DEPARTMENT US SOFT TISSUE INTERPRETATION "Study: Limited Soft Tissue Ultrasound"  INDICATIONS: Pain and Soft tissue infection Multiple views of the body part were obtained in real-time with a multi-frequency linear probe PERFORMED BY:  Myself IMAGES ARCHIVED?: Yes SIDE:Right  BODY PART wrist FINDINGS: small joint effusion wrist   CPT: Neck [id number]  Upper extremity [id number]  Axilla [id number]  Chest wall [id number]  Beast [id number]  Upper back [id number]  Lower back [id number]  Abdominal wall [id number]  Pelvic wall [id number]  Lower extremity V291356  Other soft tissue [id number]    Labs Review Labs Reviewed  SYNOVIAL CELL COUNT + DIFF, W/ CRYSTALS - Abnormal; Notable for the following:    Color, Synovial YELLOW (*)    WBC, Synovial 1150 (*)    Neutrophil, Synovial 79 (*)    Monocyte-Macrophage-Synovial Fluid 4 (*)    All other components within normal limits  GRAM STAIN  BODY FLUID CULTURE    Imaging Review Dg Hand Complete Right  07/03/2015  CLINICAL DATA:  Right hand pain and swelling. EXAM: RIGHT HAND - COMPLETE 3+ VIEW COMPARISON:  Radiographs dated 02/28/2013 FINDINGS: There is marked swelling of the soft tissues of the hand and wrist. Slight arthritic changes at the first carpal metacarpal joint. Arterial vascular calcification consistent with the patient's diabetes. IMPRESSION: Nonspecific prominent soft tissue swelling. Electronically Signed   By: Lorriane Shire M.D.   On: 07/03/2015 17:09   I have personally reviewed and evaluated these images and lab results as part of my medical decision-making.   EKG Interpretation None      MDM   Final diagnoses:  Wrist swelling, right   Concern for inflammatory reaction in the right wrist. Discussed risks and benefits of obtaining a sample of joint fluid and sending it to the lab for orthopedics and patient to follow-up in 2 days. Patient is on Plavix and understands risks and benefits. Ultrasound-guided aspiration  successful. 2 mL sent to lab. Patient does not want pain meds at this time and will follow-up with orthopedics on Tuesday.  Needle aspiration revealed yellow fluid, no fevers, no cellulitis. Discussed importance of follow-up of culture and aspiration sample sent with orthopedics on Tuesday. X-ray reviewed no acute fracture, nonspecific swelling. Results and differential diagnosis were discussed with the patient/parent/guardian. Xrays were independently reviewed by myself.  Close follow up outpatient was discussed, comfortable with the plan.   Medications  lidocaine-EPINEPHrine (XYLOCAINE-EPINEPHrine) 1 %-1:200000 (PF) injection 10 mL (not administered)    Filed Vitals:   07/03/15 1608  BP: 158/88  Pulse: 83  Temp: 97.9 F (36.6 C)  TempSrc: Oral  Resp: 16  SpO2: 96%    Final  diagnoses:  Wrist swelling, right       Elnora Morrison, MD 07/03/15 2040

## 2015-07-03 NOTE — ED Notes (Signed)
6 pack of Hydrocodone given to patient in front of wife and Ginger Pruitt, RN, instructions reviewed.

## 2015-07-03 NOTE — ED Notes (Signed)
Pt c/o swelling to right hand and wrist since Friday. Denies injury.

## 2015-07-04 ENCOUNTER — Telehealth: Payer: Self-pay | Admitting: *Deleted

## 2015-07-04 ENCOUNTER — Encounter: Payer: Self-pay | Admitting: Family Medicine

## 2015-07-04 ENCOUNTER — Ambulatory Visit (INDEPENDENT_AMBULATORY_CARE_PROVIDER_SITE_OTHER): Payer: Commercial Managed Care - HMO | Admitting: Family Medicine

## 2015-07-04 VITALS — BP 144/98 | Ht 73.0 in | Wt 264.0 lb

## 2015-07-04 DIAGNOSIS — N189 Chronic kidney disease, unspecified: Secondary | ICD-10-CM

## 2015-07-04 DIAGNOSIS — N183 Chronic kidney disease, stage 3 unspecified: Secondary | ICD-10-CM

## 2015-07-04 DIAGNOSIS — M1000 Idiopathic gout, unspecified site: Secondary | ICD-10-CM | POA: Diagnosis not present

## 2015-07-04 MED ORDER — COLCHICINE 0.6 MG PO TABS: ORAL_TABLET | ORAL | Status: DC

## 2015-07-04 MED ORDER — PREDNISONE 20 MG PO TABS: ORAL_TABLET | ORAL | Status: DC

## 2015-07-04 MED FILL — Hydrocodone-Acetaminophen Tab 5-325 MG: ORAL | Qty: 6 | Status: AC

## 2015-07-04 NOTE — Progress Notes (Signed)
   Subjective:    Patient ID: Eugene [name], male    DOB: 1955/11/01, 59 y.o.   MRN: [id number]  HPIright hand pain and swelling. Started 4 days ago. Went to aph ed last night. Using ice, heat, tylenol and pain medicine cream. Ed prescribed hydrocodone but pt does not want to take.  At the ER they did do aspiration which showed uric r acid crystals. Patient has not had any fever relates pain and discomfort with movement of the wrist never had this problem before.   Review of Systems See above. Denies other joint pain. Denies fever relates swelling in the wrist. No rashes.    Objective:   Physical Exam Lungs clear heart rate controlled elbows are normal left arm is normal right arm at the wrist or is swelling in difficulty with range of motion   Patient was told he starts having fever increased swelling or redness needs a follow-up right away    Assessment & Plan:  I reviewed over the ER records it appears that this patient has gout I recommend short prednisone taper as well as cultures seen. Follow-up if progressive troubles check lab work may need to be on allopurinol.

## 2015-07-04 NOTE — Telephone Encounter (Signed)
Patient called stating he was seen in the ER and the provider told him to call Dr. Aline Brochure to be seen for Gout in his Right hand, I made patient aware that Dr. Aline Brochure doesn't treat gout to call his PCP, and if they feel he will still need to be seen by Dr. Aline Brochure they will need to send a referral since he has [facility], Patient stated verbally he understands.

## 2015-07-05 NOTE — Patient Outreach (Signed)
[facility]) Care Management  07/05/2015  [place] 1956/02/20 [id number]   Notification from disciplines involved to close case due to goals met with [place] Management.  Thanks, Ronnell Freshwater. [place], [place] Assistant Phone: [phone] Fax: [phone]

## 2015-07-07 ENCOUNTER — Ambulatory Visit: Payer: Commercial Managed Care - HMO | Admitting: Cardiovascular Disease

## 2015-07-07 LAB — BODY FLUID CULTURE
Culture: NO GROWTH
Special Requests: NORMAL

## 2015-07-20 ENCOUNTER — Encounter (INDEPENDENT_AMBULATORY_CARE_PROVIDER_SITE_OTHER): Payer: Commercial Managed Care - HMO | Admitting: Ophthalmology

## 2015-07-20 DIAGNOSIS — E113311 Type 2 diabetes mellitus with moderate nonproliferative diabetic retinopathy with macular edema, right eye: Secondary | ICD-10-CM

## 2015-07-20 DIAGNOSIS — E113512 Type 2 diabetes mellitus with proliferative diabetic retinopathy with macular edema, left eye: Secondary | ICD-10-CM | POA: Diagnosis not present

## 2015-07-20 DIAGNOSIS — E11311 Type 2 diabetes mellitus with unspecified diabetic retinopathy with macular edema: Secondary | ICD-10-CM | POA: Diagnosis not present

## 2015-07-20 DIAGNOSIS — I10 Essential (primary) hypertension: Secondary | ICD-10-CM

## 2015-07-20 DIAGNOSIS — H43813 Vitreous degeneration, bilateral: Secondary | ICD-10-CM

## 2015-07-20 DIAGNOSIS — H35033 Hypertensive retinopathy, bilateral: Secondary | ICD-10-CM | POA: Diagnosis not present

## 2015-07-23 ENCOUNTER — Encounter (HOSPITAL_COMMUNITY): Payer: Self-pay

## 2015-07-23 ENCOUNTER — Emergency Department (HOSPITAL_COMMUNITY)
Admission: EM | Admit: 2015-07-23 | Discharge: 2015-07-23 | Disposition: A | Discharge status: Home / Self Care | Payer: Commercial Managed Care - HMO | Attending: Emergency Medicine | Admitting: Emergency Medicine

## 2015-07-23 DIAGNOSIS — Z87442 Personal history of urinary calculi: Secondary | ICD-10-CM | POA: Diagnosis not present

## 2015-07-23 DIAGNOSIS — K219 Gastro-esophageal reflux disease without esophagitis: Secondary | ICD-10-CM | POA: Diagnosis not present

## 2015-07-23 DIAGNOSIS — Z8659 Personal history of other mental and behavioral disorders: Secondary | ICD-10-CM | POA: Diagnosis not present

## 2015-07-23 DIAGNOSIS — I10 Essential (primary) hypertension: Secondary | ICD-10-CM | POA: Insufficient documentation

## 2015-07-23 DIAGNOSIS — I25119 Atherosclerotic heart disease of native coronary artery with unspecified angina pectoris: Secondary | ICD-10-CM | POA: Insufficient documentation

## 2015-07-23 DIAGNOSIS — Z79899 Other long term (current) drug therapy: Secondary | ICD-10-CM | POA: Diagnosis not present

## 2015-07-23 DIAGNOSIS — E1140 Type 2 diabetes mellitus with diabetic neuropathy, unspecified: Secondary | ICD-10-CM | POA: Insufficient documentation

## 2015-07-23 DIAGNOSIS — I252 Old myocardial infarction: Secondary | ICD-10-CM | POA: Diagnosis not present

## 2015-07-23 DIAGNOSIS — Z7902 Long term (current) use of antithrombotics/antiplatelets: Secondary | ICD-10-CM | POA: Diagnosis not present

## 2015-07-23 DIAGNOSIS — F419 Anxiety disorder, unspecified: Secondary | ICD-10-CM | POA: Diagnosis not present

## 2015-07-23 DIAGNOSIS — J449 Chronic obstructive pulmonary disease, unspecified: Secondary | ICD-10-CM | POA: Insufficient documentation

## 2015-07-23 DIAGNOSIS — E785 Hyperlipidemia, unspecified: Secondary | ICD-10-CM | POA: Insufficient documentation

## 2015-07-23 DIAGNOSIS — H1132 Conjunctival hemorrhage, left eye: Secondary | ICD-10-CM | POA: Insufficient documentation

## 2015-07-23 DIAGNOSIS — I509 Heart failure, unspecified: Secondary | ICD-10-CM | POA: Insufficient documentation

## 2015-07-23 DIAGNOSIS — H538 Other visual disturbances: Secondary | ICD-10-CM | POA: Diagnosis present

## 2015-07-23 DIAGNOSIS — Z7982 Long term (current) use of aspirin: Secondary | ICD-10-CM | POA: Insufficient documentation

## 2015-07-23 NOTE — ED Notes (Signed)
Pt alert & oriented x4, stable gait. Patient given discharge instructions, paperwork & prescription(s). Patient  instructed to stop at the registration desk to finish any additional paperwork. Patient verbalized understanding. Pt left department w/ no further questions. 

## 2015-07-23 NOTE — ED Provider Notes (Signed)
CSN: [id number]     Arrival date & time 07/23/15  1737 History   First MD Initiated Contact with Patient 07/23/15 1817     Chief Complaint  Patient presents with  . Eye Problem    HPI Patient had Injection on Wednesday into his left eye. Patient is receiving these treatments at [facility]. patient states these treatments are to help him with retinal issues associated with his diabetes  He did sneeze hard on Wednesday after the procedure and noticed it that his left eye became red that after that.  Has had injection in the past and this and never had the problem.   Patient states he's noticed some slight blurred vision of his left eye but otherwise it is normal. He denies any fevers or chills. No pain. Past Medical History  Diagnosis Date  . Arteriosclerotic cardiovascular disease (ASCVD)     a. 05/2011 Cath/PCI: LM nl, LAD 28m, D1 small, D2 small 84m, LCX large 40p, RCA 50-60p, 99 hazy @ origin of PDA with 70-80 in PDA (2.5x26 Resolute Integrity & 3.0x15 Resolute Integrity DES).;  b. 08/2012 Inflat  STEMI/Cath/PCI: LM minor irregs, LAD 50p, D1 50, LCX nl, OM1 25, RCA 30-40p, 100d (treated with 2.75x76mm Promus Premier DES);  c. 08/2012 Echo: EF 55-60%, basal inferopost HK.  [name] Hyperlipidemia   . Diabetes mellitus     Peripheral neuropathy  . Bell palsy   . Hypertension   . COPD (chronic obstructive pulmonary disease) ([place])   . Sleep apnea   . Gallstones   . Cholelithiasis 07/2012    Asymptomatic; identified incidentally  . PONV (postoperative nausea and vomiting)   . Anxiety   . C. difficile colitis     a. 08/2012  . Myocardial infarct ([place]) 09/08/12  . Nephrolithiasis   . Contrast dye induced nephropathy     a. 08/2012 post cath/pci  . CHF (congestive heart failure) ([place])   . Asthma   . Heart disease   . Old myocardial infarction   . Anginal pain ([place])   . GERD (gastroesophageal reflux disease)    Past Surgical History  Procedure Laterality Date  . Circumcision    .  Stents    . Esophagogastroduodenoscopy      in [place] over 20 yrs ago  . Colonoscopy      In [facility], approximately 2011 per patient, was normal. Advised to come back in 10 years.  . Esophagogastroduodenoscopy  06/12/2012    [id number] esophagus-status post Venia Minks dilation. Abnormal gastric mucosa of uncertain significance-status post biopsy  . Cardiac catheterization    . Subxyphoid pericardial window N/A 06/23/2013    Procedure: SUBXYPHOID PERICARDIAL WINDOW;  Surgeon: Grace Isaac, MD;  Location: [place];  Service: Thoracic;  Laterality: N/A;  . Intraoperative transesophageal echocardiogram N/A 06/23/2013    Procedure: INTRAOPERATIVE TRANSESOPHAGEAL ECHOCARDIOGRAM;  Surgeon: Grace Isaac, MD;  Location: [place];  Service: Open Heart Surgery;  Laterality: N/A;  . Left heart catheterization with coronary angiogram N/A 09/08/2012    Procedure: LEFT HEART CATHETERIZATION WITH CORONARY ANGIOGRAM;  Surgeon: [name], MD;  Location: [facility] CATH LAB;  Service: Cardiovascular;  Laterality: N/A;  . Chest tube insertion     Family History  Problem Relation Age of Onset  . Diabetes Mother   . Heart attack Mother   . Stroke Mother   . Diabetes Sister   . Sleep apnea Sister   . Hypertension Brother   . Diabetes Brother   . Colon  cancer Neg Hx   . Liver disease Neg Hx   . Diabetes Brother   . Hypertension Brother    Social History  Substance Use Topics  . Smoking status: Never Smoker   . Smokeless tobacco: Never Used  . Alcohol Use: No     Comment: heavy etoh use 30 years ago    Review of Systems  All other systems reviewed and are negative.     Allergies  Hydrocodone; Lisinopril; Neurontin; Statins; Metformin and related; and Norvasc  Home Medications   Prior to Admission medications   Medication Sig Start Date End Date Taking? Authorizing Provider  albuterol (PROVENTIL HFA;VENTOLIN HFA) 108 (90 BASE) MCG/ACT inhaler Inhale 2 puffs into the lungs  every 4 (four) hours as needed for shortness of breath. 09/28/14  Yes [name], MD  ALPRAZolam Duanne Moron) 0.5 MG tablet TAKE 1/2-1 TABLET BY MOUTH AT BEDTIME AS NEEDED 03/25/15  Yes Mikey Kirschner, MD  aspirin EC 81 MG tablet Take 81 mg by mouth at bedtime.    Yes Historical Provider, MD  clopidogrel (PLAVIX) 75 MG tablet TAKE 1 TABLET AT BEDTIME 03/31/15  Yes [name], MD  esomeprazole (NEXIUM) 20 MG capsule Take 20 mg by mouth daily as needed (for acid reflux).   Yes Historical Provider, MD  fenofibrate 160 MG tablet Take 1 tablet (160 mg total) by mouth at bedtime. 12/08/14  Yes [name], MD  hydrALAZINE (APRESOLINE) 50 MG tablet Take 1 tablet (50 mg total) by mouth 2 (two) times daily. 06/10/15  Yes Herminio Commons, MD  losartan (COZAAR) 100 MG tablet Take 100 mg by mouth daily. 05/28/15  Yes Historical Provider, MD  metoprolol tartrate (LOPRESSOR) 25 MG tablet Take 1 tablet (25 mg total) by mouth 2 (two) times daily. 04/06/15  Yes [name], MD  torsemide (DEMADEX) 20 MG tablet Take 2 tablets (40 mg total) by mouth daily. 06/10/15  Yes Herminio Commons, MD  trimethoprim-polymyxin b (POLYTRIM) ophthalmic solution Place 1 drop into both eyes daily as needed. For irritation 06/22/15  Yes Historical Provider, MD  Azilsartan Medoxomil (EDARBI) 80 MG TABS Take 1 tablet (80 mg total) by mouth daily. Patient not taking: Reported on 07/23/2015 06/16/15   Herminio Commons, MD  BESIVANCE 0.6 % SUSP Place 1 drop into both eyes 2 (two) times daily. 04/27/15   Historical Provider, MD  colchicine (COLCRYS) 0.6 MG tablet 2 now then one bid until relief Patient not taking: Reported on 07/23/2015 07/04/15   [name], MD  HYDROcodone-acetaminophen (VICODIN) 5-325 mg TABS tablet Take 6 tablets by mouth once. Patient not taking: Reported on 07/04/2015 07/03/15   Elnora Morrison, MD  predniSONE (DELTASONE) 20 MG tablet 2 qd for up to 5 days for gout flare up Patient not taking: Reported on  07/23/2015 07/04/15   [name], MD   BP 216/93 mmHg  Pulse 96  Temp(Src) 98.9 F (37.2 C) (Oral)  Resp 18  Ht 6\' 1"  (1.854 m)  Wt 118.842 kg  BMI 34.57 kg/m2  SpO2 98% Physical Exam  Constitutional: He appears well-developed and well-nourished. No distress.  HENT:  Head: Normocephalic and atraumatic.  Right Ear: External ear normal.  Left Ear: External ear normal.  Eyes: Pupils are equal, round, and reactive to light. Right eye exhibits no discharge. Left eye exhibits no discharge. Left conjunctiva has a hemorrhage. No scleral icterus.  Fundoscopic exam:      The left eye shows no hemorrhage and  no papilledema. The left eye shows red reflex.  Neck: Neck supple. No tracheal deviation present.  Cardiovascular: Normal rate.   Pulmonary/Chest: Effort normal. No stridor. No respiratory distress.  Musculoskeletal: He exhibits no edema.  Neurological: He is alert. Cranial nerve deficit: no gross deficits.  Skin: Skin is warm and dry. No rash noted.  Psychiatric: He has a normal mood and affect.  Nursing note and vitals reviewed.   ED Course  Procedures (including critical care time) Labs Review Labs Reviewed - No data to display  Imaging Review No results found. I have personally reviewed and evaluated these images and lab results as part of my medical decision-making.   EKG Interpretation None      MDM   Final diagnoses:  None    Patient has a subconjunctival hemorrhage. His vision is intact. I do not think is having any serious complications associated with his injection.  He is also hypertensive. He states he has been taking his medications. Discussed outpatient follow up and monitoring.   Dorie Rank, MD 07/23/15 [phone]

## 2015-07-23 NOTE — ED Notes (Signed)
Patient states on Wednesday he had an injection into his left eye at [facility], in [place] for bleeding issues, does not know what the injection was but states it was suppose to stop the bleeding behind his eye. Wednesday night he noticed that eye was red, states he could not get through to the eye center, today the redness is worse and the sight in that eye is blurry.

## 2015-07-23 NOTE — Discharge Instructions (Signed)
Hypertension Hypertension, commonly called high blood pressure, is when the force of blood pumping through your arteries is too strong. Your arteries are the blood vessels that carry blood from your heart throughout your body. A blood pressure reading consists of a higher number over a lower number, such as 110/72. The higher number (systolic) is the pressure inside your arteries when your heart pumps. The lower number (diastolic) is the pressure inside your arteries when your heart relaxes. Ideally you want your blood pressure below 120/80. Hypertension forces your heart to work harder to pump blood. Your arteries may become narrow or stiff. Having untreated or uncontrolled hypertension can cause heart attack, stroke, kidney disease, and other problems. RISK FACTORS Some risk factors for high blood pressure are controllable. Others are not.  Risk factors you cannot control include:   Race. You may be at higher risk if you are African American.  Age. Risk increases with age.  Gender. Men are at higher risk than women before age 45 years. After age 65, women are at higher risk than men. Risk factors you can control include:  Not getting enough exercise or physical activity.  Being overweight.  Getting too much fat, sugar, calories, or salt in your diet.  Drinking too much alcohol. SIGNS AND SYMPTOMS Hypertension does not usually cause signs or symptoms. Extremely high blood pressure (hypertensive crisis) may cause headache, anxiety, shortness of breath, and nosebleed. DIAGNOSIS To check if you have hypertension, your health care provider will measure your blood pressure while you are seated, with your arm held at the level of your heart. It should be measured at least twice using the same arm. Certain conditions can cause a difference in blood pressure between your right and left arms. A blood pressure reading that is higher than normal on one occasion does not mean that you need treatment. If  it is not clear whether you have high blood pressure, you may be asked to return on a different day to have your blood pressure checked again. Or, you may be asked to monitor your blood pressure at home for 1 or more weeks. TREATMENT Treating high blood pressure includes making lifestyle changes and possibly taking medicine. Living a healthy lifestyle can help lower high blood pressure. You may need to change some of your habits. Lifestyle changes may include:  Following the DASH diet. This diet is high in fruits, vegetables, and whole grains. It is low in salt, red meat, and added sugars.  Keep your sodium intake below 2,300 mg per day.  Getting at least 30-45 minutes of aerobic exercise at least 4 times per week.  Losing weight if necessary.  Not smoking.  Limiting alcoholic beverages.  Learning ways to reduce stress. Your health care provider may prescribe medicine if lifestyle changes are not enough to get your blood pressure under control, and if one of the following is true:  You are 18-59 years of age and your systolic blood pressure is above 140.  You are 60 years of age or older, and your systolic blood pressure is above 150.  Your diastolic blood pressure is above 90.  You have diabetes, and your systolic blood pressure is over 140 or your diastolic blood pressure is over 90.  You have kidney disease and your blood pressure is above 140/90.  You have heart disease and your blood pressure is above 140/90. Your personal target blood pressure may vary depending on your medical conditions, your age, and other factors. HOME CARE INSTRUCTIONS    Have your blood pressure rechecked as directed by your health care provider.   Take medicines only as directed by your health care provider. Follow the directions carefully. Blood pressure medicines must be taken as prescribed. The medicine does not work as well when you skip doses. Skipping doses also puts you at risk for  problems.  Do not smoke.   Monitor your blood pressure at home as directed by your health care provider. SEEK MEDICAL CARE IF:   You think you are having a reaction to medicines taken.  You have recurrent headaches or feel dizzy.  You have swelling in your ankles.  You have trouble with your vision. SEEK IMMEDIATE MEDICAL CARE IF:  You develop a severe headache or confusion.  You have unusual weakness, numbness, or feel faint.  You have severe chest or abdominal pain.  You vomit repeatedly.  You have trouble breathing. MAKE SURE YOU:   Understand these instructions.  Will watch your condition.  Will get help right away if you are not doing well or get worse.   This information is not intended to replace advice given to you by your health care provider. Make sure you discuss any questions you have with your health care provider.   Document Released: 08/13/2005 Document Revised: 12/28/2014 Document Reviewed: 06/05/2013 Elsevier Interactive Patient Education 2016 Elsevier Inc. Subconjunctival Hemorrhage Subconjunctival hemorrhage is bleeding that happens between the white part of your eye (sclera) and the clear membrane that covers the outside of your eye (conjunctiva). There are many tiny blood vessels near the surface of your eye. A subconjunctival hemorrhage happens when one or more of these vessels breaks and bleeds, causing a red patch to appear on your eye. This is similar to a bruise. Depending on the amount of bleeding, the red patch may only cover a small area of your eye or it may cover the entire visible part of the sclera. If a lot of blood collects under the conjunctiva, there may also be swelling. Subconjunctival hemorrhages do not affect your vision or cause pain, but your eye may feel irritated if there is swelling. Subconjunctival hemorrhages usually do not require treatment, and they disappear on their own within two weeks. CAUSES This condition may be  caused by:  Mild trauma, such as rubbing your eye too hard.  Severe trauma or blunt injuries.  Coughing, sneezing, or vomiting.  Straining, such as when lifting a heavy object.  High blood pressure.  Recent eye surgery.  A history of diabetes.  Certain medicines, especially blood thinners (anticoagulants).  Other conditions, such as eye tumors, bleeding disorders, or blood vessel abnormalities. Subconjunctival hemorrhages can happen without an obvious cause.  SYMPTOMS  Symptoms of this condition include:  A bright red or dark red patch on the white part of the eye.  The red area may spread out to cover a larger area of the eye before it goes away.  The red area may turn brownish-yellow before it goes away.  Swelling.  Mild eye irritation. DIAGNOSIS This condition is diagnosed with a physical exam. If your subconjunctival hemorrhage was caused by trauma, your health care provider may refer you to an eye specialist (ophthalmologist) or another specialist to check for other injuries. You may have other tests, including:  An eye exam.  A blood pressure check.  Blood tests to check for bleeding disorders. If your subconjunctival hemorrhage was caused by trauma, X-rays or a CT scan may be done to check for other injuries. TREATMENT Usually, no  treatment is needed. Your health care provider may recommend eye drops or cold compresses to help with discomfort. HOME CARE INSTRUCTIONS  Take over-the-counter and prescription medicines only as directed by your health care provider.  Use eye drops or cold compresses to help with discomfort as directed by your health care provider.  Avoid activities, things, and environments that may irritate or injure your eye.  Keep all follow-up visits as told by your health care provider. This is important. SEEK MEDICAL CARE IF:  You have pain in your eye.  The bleeding does not go away within 3 weeks.  You keep getting new  subconjunctival hemorrhages. SEEK IMMEDIATE MEDICAL CARE IF:  Your vision changes or you have difficulty seeing.  You suddenly develop severe sensitivity to light.  You develop a severe headache, persistent vomiting, confusion, or abnormal tiredness (lethargy).  Your eye seems to bulge or protrude from your eye socket.  You develop unexplained bruises on your body.  You have unexplained bleeding in another area of your body.   This information is not intended to replace advice given to you by your health care provider. Make sure you discuss any questions you have with your health care provider.   Document Released: 08/13/2005 Document Revised: 05/04/2015 Document Reviewed: 10/20/2014 Elsevier Interactive Patient Education [employer].

## 2015-07-25 DIAGNOSIS — N189 Chronic kidney disease, unspecified: Secondary | ICD-10-CM | POA: Diagnosis not present

## 2015-07-25 DIAGNOSIS — M1000 Idiopathic gout, unspecified site: Secondary | ICD-10-CM | POA: Diagnosis not present

## 2015-07-26 LAB — BASIC METABOLIC PANEL
BUN / CREAT RATIO: 26 — AB (ref 9–20)
BUN: 33 mg/dL — AB (ref 6–24)
CALCIUM: 8.9 mg/dL (ref 8.7–10.2)
CHLORIDE: 100 mmol/L (ref 97–106)
CO2: 22 mmol/L (ref 18–29)
CREATININE: 1.27 mg/dL (ref 0.76–1.27)
GFR calc non Af Amer: 62 mL/min/{1.73_m2} (ref >59)
GFR, EST AFRICAN AMERICAN: 72 mL/min/{1.73_m2} (ref >59)
GLUCOSE: 311 mg/dL — AB (ref 65–99)
Potassium: 4.9 mmol/L (ref 3.5–5.2)
Sodium: 138 mmol/L (ref 136–144)

## 2015-07-26 LAB — URIC ACID: URIC ACID: 11.7 mg/dL — AB (ref 3.7–8.6)

## 2015-07-27 ENCOUNTER — Other Ambulatory Visit: Payer: Self-pay | Admitting: Family Medicine

## 2015-07-27 ENCOUNTER — Other Ambulatory Visit: Payer: Self-pay

## 2015-07-27 DIAGNOSIS — M109 Gout, unspecified: Secondary | ICD-10-CM

## 2015-07-27 MED ORDER — ALLOPURINOL 100 MG PO TABS: 100.0000 mg | ORAL_TABLET | Freq: Every day | ORAL | Status: DC

## 2015-07-27 NOTE — Telephone Encounter (Signed)
This am 3 refills

## 2015-07-28 ENCOUNTER — Encounter: Payer: Self-pay | Admitting: Cardiovascular Disease

## 2015-07-28 ENCOUNTER — Ambulatory Visit (INDEPENDENT_AMBULATORY_CARE_PROVIDER_SITE_OTHER): Payer: Commercial Managed Care - HMO | Admitting: Cardiovascular Disease

## 2015-07-28 ENCOUNTER — Other Ambulatory Visit: Payer: Self-pay | Admitting: Family Medicine

## 2015-07-28 VITALS — BP 142/77 | HR 64 | Ht 73.0 in | Wt 265.0 lb

## 2015-07-28 DIAGNOSIS — I313 Pericardial effusion (noninflammatory): Secondary | ICD-10-CM

## 2015-07-28 DIAGNOSIS — I5032 Chronic diastolic (congestive) heart failure: Secondary | ICD-10-CM | POA: Diagnosis not present

## 2015-07-28 DIAGNOSIS — I319 Disease of pericardium, unspecified: Secondary | ICD-10-CM

## 2015-07-28 DIAGNOSIS — I10 Essential (primary) hypertension: Secondary | ICD-10-CM

## 2015-07-28 DIAGNOSIS — E782 Mixed hyperlipidemia: Secondary | ICD-10-CM

## 2015-07-28 DIAGNOSIS — I252 Old myocardial infarction: Secondary | ICD-10-CM

## 2015-07-28 DIAGNOSIS — I2510 Atherosclerotic heart disease of native coronary artery without angina pectoris: Secondary | ICD-10-CM

## 2015-07-28 DIAGNOSIS — Z955 Presence of coronary angioplasty implant and graft: Secondary | ICD-10-CM

## 2015-07-28 DIAGNOSIS — N183 Chronic kidney disease, stage 3 unspecified: Secondary | ICD-10-CM

## 2015-07-28 DIAGNOSIS — R600 Localized edema: Secondary | ICD-10-CM

## 2015-07-28 DIAGNOSIS — I3139 Other pericardial effusion (noninflammatory): Secondary | ICD-10-CM

## 2015-07-28 LAB — HM DIABETES EYE EXAM

## 2015-07-28 MED ORDER — CLONIDINE HCL 0.1 MG PO TABS: 0.1000 mg | ORAL_TABLET | Freq: Two times a day (BID) | ORAL | Status: DC

## 2015-07-28 NOTE — Telephone Encounter (Signed)
May have this and 4 refills 

## 2015-07-28 NOTE — Patient Instructions (Addendum)
   Begin Clonidine 0.1mg  twice a day  - new sent to [facility] today.  Continue all other medications.   Your physician has requested that you regularly monitor and record your blood pressure readings at home. Please take your readings at varied times of the day 3-4 x per week for 1 month.  Return to office for MD review.  Start logging your readings after being on new medication x 1 week.   Follow up in  3 months

## 2015-07-28 NOTE — Progress Notes (Signed)
Patient ID: Eugene [name], male   DOB: Aug 13, 1956, 59 y.o.   MRN: [id number]      SUBJECTIVE: Mr. Mccauslin presents for follow-up of chronic diastolic heart failure, malignant hypertension, and coronary artery disease.  Echocardiogram on 9 /29/16 demonstrated normal left ventricular systolic function and regional wall motion, LVEF 60-65%, severe LVH, grade 2 diastolic dysfunction, moderate left atrial and mild right atrial dilatation, dilated IVC , and a mild to moderate circumferential pericardial effusion with no evidence of tamponade physiology.  He feels very well and denies chest pain, palpitations, shortness of breath. He does have mild leg swelling in the left leg moreso than the right, particularly in the mornings. He has been monitoring his blood pressure and while at times he has been normotensive there are several readings in the 123XX123 mmHg systolic range. He has been battling an acute flare of gout in his right wrist and has not been to the [facility] in 3 weeks. He had been doing one half hours of exercises in the swimming pool and walking 1 mile daily.   Review of Systems: As per "subjective", otherwise negative.  Allergies  Allergen Reactions  . Hydrocodone Nausea And Vomiting  . Lisinopril Cough  . Neurontin [Gabapentin] Other (See Comments)    Suicidal thoughts  . Statins Other (See Comments)    Muscle aches  . Metformin And Related Diarrhea and Other (See Comments)    Intestinal side effects  . Norvasc [Amlodipine Besylate] Swelling    Pedal edema    Current Outpatient Prescriptions  Medication Sig Dispense Refill  . albuterol (PROVENTIL HFA;VENTOLIN HFA) 108 (90 BASE) MCG/ACT inhaler Inhale 2 puffs into the lungs every 4 (four) hours as needed for shortness of breath. 1 Inhaler 2  . allopurinol (ZYLOPRIM) 100 MG tablet Take 1 tablet (100 mg total) by mouth daily. 30 tablet 3  . ALPRAZolam (XANAX) 0.5 MG tablet TAKE 1/2-1 TABLET BY MOUTH AT BEDTIME AS NEEDED 30 tablet  3  . aspirin EC 81 MG tablet Take 81 mg by mouth at bedtime.     [name] BESIVANCE 0.6 % SUSP Place 1 drop into both eyes 2 (two) times daily.  12  . clopidogrel (PLAVIX) 75 MG tablet TAKE 1 TABLET AT BEDTIME 90 tablet 1  . colchicine (COLCRYS) 0.6 MG tablet 2 now then one bid until relief 20 tablet 3  . esomeprazole (NEXIUM) 20 MG capsule Take 20 mg by mouth daily as needed (for acid reflux).    . fenofibrate 160 MG tablet Take 1 tablet (160 mg total) by mouth at bedtime. 30 tablet 5  . hydrALAZINE (APRESOLINE) 50 MG tablet Take 1 tablet (50 mg total) by mouth 2 (two) times daily. 180 tablet 1  . losartan (COZAAR) 100 MG tablet Take 100 mg by mouth daily.    . metoprolol tartrate (LOPRESSOR) 25 MG tablet Take 1 tablet (25 mg total) by mouth 2 (two) times daily. 180 tablet 3  . torsemide (DEMADEX) 20 MG tablet Take 2 tablets (40 mg total) by mouth daily. 180 tablet 1  . trimethoprim-polymyxin b (POLYTRIM) ophthalmic solution Place 1 drop into both eyes daily as needed. For irritation  12   No current facility-administered medications for this visit.    Past Medical History  Diagnosis Date  . Arteriosclerotic cardiovascular disease (ASCVD)     a. 05/2011 Cath/PCI: LM nl, LAD 53m, D1 small, D2 small 33m, LCX large 40p, RCA 50-60p, 99 hazy @ origin of PDA with 70-80 in PDA (2.5x26 Resolute  Integrity & 3.0x15 Resolute Integrity DES).;  b. 08/2012 Inflat  STEMI/Cath/PCI: LM minor irregs, LAD 50p, D1 50, LCX nl, OM1 25, RCA 30-40p, 100d (treated with 2.75x41mm Promus Premier DES);  c. 08/2012 Echo: EF 55-60%, basal inferopost HK.  [name] Hyperlipidemia   . Diabetes mellitus     Peripheral neuropathy  . Bell palsy   . Hypertension   . COPD (chronic obstructive pulmonary disease) ([place])   . Sleep apnea   . Gallstones   . Cholelithiasis 07/2012    Asymptomatic; identified incidentally  . PONV (postoperative nausea and vomiting)   . Anxiety   . C. difficile colitis     a. 08/2012  . Myocardial infarct ([place])  09/08/12  . Nephrolithiasis   . Contrast dye induced nephropathy     a. 08/2012 post cath/pci  . CHF (congestive heart failure) ([place])   . Asthma   . Heart disease   . Old myocardial infarction   . Anginal pain ([place])   . GERD (gastroesophageal reflux disease)     Past Surgical History  Procedure Laterality Date  . Circumcision    . Stents    . Esophagogastroduodenoscopy      [place] over 20 yrs ago  . Colonoscopy      In [facility], approximately 2011 per patient, was normal. Advised to come back in 10 years.  . Esophagogastroduodenoscopy  06/12/2012    [id number] esophagus-status post Venia Minks dilation. Abnormal gastric mucosa of uncertain significance-status post biopsy  . Cardiac catheterization    . Subxyphoid pericardial window N/A 06/23/2013    Procedure: SUBXYPHOID PERICARDIAL WINDOW;  Surgeon: Grace Isaac, MD;  Location: [facility];  Service: Thoracic;  Laterality: N/A;  . Intraoperative transesophageal echocardiogram N/A 06/23/2013    Procedure: INTRAOPERATIVE TRANSESOPHAGEAL ECHOCARDIOGRAM;  Surgeon: Grace Isaac, MD;  Location: [facility];  Service: Open Heart Surgery;  Laterality: N/A;  . Left heart catheterization with coronary angiogram N/A 09/08/2012    Procedure: LEFT HEART CATHETERIZATION WITH CORONARY ANGIOGRAM;  Surgeon: [name], MD;  Location: [facility] CATH [facility];  Service: Cardiovascular;  Laterality: N/A;  . Chest tube insertion      Social History   Social History  . Marital Status: Married    Spouse Name: N/A  . Number of Children: 2  . Years of Education: N/A   Occupational History  . Shipping     [employer]   Social History Main Topics  . Smoking status: Never Smoker   . Smokeless tobacco: Never Used  . Alcohol Use: No     Comment: heavy etoh use 30 years ago  . Drug Use: No  . Sexual Activity: Yes    Birth Control/ Protection: None   Other Topics Concern  . 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 Disabled at this point.     Filed Vitals:   07/28/15 1257 07/28/15 1304  BP: 160/80 142/77  Pulse: 64 64  Height: 6\' 1"  (1.854 m)   Weight: 265 lb (120.203 kg)     PHYSICAL EXAM General: NAD HEENT: Normal. Neck: No JVD, no thyromegaly. Lungs: Clear to auscultation bilaterally with normal respiratory effort. CV: Nondisplaced PMI. Regular rate and rhythm, normal S1/S2, no S3/S4, no murmur. Trace pretibial and periankle edema, L>R.  Abdomen: Firm, obese, nontender.  Neurologic: Alert and oriented x 3.  Psych: Normal affect. Skin: Normal. Musculoskeletal: Right wrist brace. Extremities: No clubbing or cyanosis.  ECG: Most recent ECG reviewed.      ASSESSMENT AND PLAN: 1. CAD s/p multiple PCI's to RCA: Stable ischemic heart disease. Continue aspirin, metoprolol, and Plavix. Not amenable to statin therapy. He would prefer to hold off on Zetia and wants to continue to pursue therapeutic lifestyle changes.   2. Malignant HTN: Elevated on hydralazine 50 mg bid along with losartan 100 mg daily. Stopped Edarbi due to cost. Will start clonidine 0.1 mg bid and have him monitor BP 3-4 times weekly x 4 weeks. He was unable to tolerate higher doses of hydralazine.  3. Chronic diastolic heart failure: Euvolemic. Continue low-sodium and fluid-restricted diet. Continue compression stocking use. BP control of prime importance.  4. Mixed dyslipidemia: Markedly elevated lipids on 01/18/15 in context of diabetes and CAD. Intolerant to statins (muscle aches). On fenofibrate. He would prefer to hold off on Zetia and wants to continue to pursue therapeutic lifestyle changes. Will obtain copy of most recent lipids.  5. Pericardial effusion s/p pericardial window: Small to medium in size and stable in 04/2015. Continue to monitor.   Dispo: f/u 3 months.  Kate Sable, M.D., F.A.C.C.

## 2015-08-08 ENCOUNTER — Ambulatory Visit (INDEPENDENT_AMBULATORY_CARE_PROVIDER_SITE_OTHER): Payer: Commercial Managed Care - HMO | Admitting: Family Medicine

## 2015-08-08 ENCOUNTER — Encounter: Payer: Self-pay | Admitting: Family Medicine

## 2015-08-08 VITALS — BP 130/78 | Ht 73.0 in | Wt 269.4 lb

## 2015-08-08 DIAGNOSIS — E119 Type 2 diabetes mellitus without complications: Secondary | ICD-10-CM

## 2015-08-08 DIAGNOSIS — Z794 Long term (current) use of insulin: Secondary | ICD-10-CM | POA: Diagnosis not present

## 2015-08-08 DIAGNOSIS — I10 Essential (primary) hypertension: Secondary | ICD-10-CM | POA: Diagnosis not present

## 2015-08-08 DIAGNOSIS — M10031 Idiopathic gout, right wrist: Secondary | ICD-10-CM

## 2015-08-08 DIAGNOSIS — M109 Gout, unspecified: Secondary | ICD-10-CM | POA: Insufficient documentation

## 2015-08-08 LAB — POCT GLYCOSYLATED HEMOGLOBIN (HGB A1C): HEMOGLOBIN A1C: 7.5

## 2015-08-08 MED ORDER — OXYCODONE-ACETAMINOPHEN 5-325 MG PO TABS: 1.0000 | ORAL_TABLET | Freq: Three times a day (TID) | ORAL | Status: DC | PRN

## 2015-08-08 NOTE — Progress Notes (Signed)
   Subjective:    Patient ID: Eugene [name], male    DOB: 16-Sep-1955, 59 y.o.   MRN: [id number]  Diabetes He presents for his follow-up diabetic visit. He has type 2 diabetes mellitus. There are no hypoglycemic associated symptoms. Pertinent negatives for hypoglycemia include no confusion. There are no diabetic associated symptoms. Pertinent negatives for diabetes include no chest pain, no fatigue, no polydipsia, no polyphagia and no weakness. There are no hypoglycemic complications. There are no diabetic complications. There are no known risk factors for coronary artery disease. Current diabetic treatment includes insulin injections. He is compliant with treatment all of the time.   Patient sees eye doctor regularly for eye care. He receives injections in his eyes. Patient relates his vision is still acceptable Patient relates blood pressure under fair control continue medications recently started by cardiologist no complications with this Patient with right wrist stiffness and discomfort using allopurinol current   Review of Systems  Constitutional: Negative for activity change, appetite change and fatigue.  HENT: Negative for congestion.   Respiratory: Negative for cough.   Cardiovascular: Negative for chest pain.  Gastrointestinal: Negative for abdominal pain.  Endocrine: Negative for polydipsia and polyphagia.  Neurological: Negative for weakness.  Psychiatric/Behavioral: Negative for confusion.       Objective:   Physical Exam  Neck no masses lungs are clear hearts regular abdomen soft some stiffness of the right wrist noted no redness or tenderness some swelling in the left lower leg no calf tenderness foot exam normal      Assessment & Plan:  Diabetes subpar control encourage better diet continue current medications follow-up A1c in 3-4 months  Hypertension good control today blood pressure good continue current measures  Pedal edema of the left leg greater than the  right leg this is chronic patient on diuretic continue current measures  Severe gout right wrist continue current medications check lab work in a couple weeks May need to adjust allopurinol  Severe pain of the right wrist intermittent oxycodone per patient request caution drowsiness  25 minutes was spent with the patient. Greater than half the time was spent in discussion and answering questions and counseling regarding the issues that the patient came in for today.

## 2015-08-18 ENCOUNTER — Encounter (HOSPITAL_COMMUNITY): Payer: Self-pay | Admitting: *Deleted

## 2015-08-18 ENCOUNTER — Emergency Department (HOSPITAL_COMMUNITY)
Admission: EM | Admit: 2015-08-18 | Discharge: 2015-08-18 | Disposition: A | Discharge status: Home / Self Care | Payer: Commercial Managed Care - HMO | Attending: Emergency Medicine | Admitting: Emergency Medicine

## 2015-08-18 ENCOUNTER — Emergency Department (HOSPITAL_COMMUNITY): Payer: Commercial Managed Care - HMO

## 2015-08-18 ENCOUNTER — Encounter (INDEPENDENT_AMBULATORY_CARE_PROVIDER_SITE_OTHER): Payer: Commercial Managed Care - HMO | Admitting: Ophthalmology

## 2015-08-18 DIAGNOSIS — Z794 Long term (current) use of insulin: Secondary | ICD-10-CM | POA: Diagnosis not present

## 2015-08-18 DIAGNOSIS — K089 Disorder of teeth and supporting structures, unspecified: Secondary | ICD-10-CM | POA: Diagnosis not present

## 2015-08-18 DIAGNOSIS — K8020 Calculus of gallbladder without cholecystitis without obstruction: Secondary | ICD-10-CM | POA: Insufficient documentation

## 2015-08-18 DIAGNOSIS — M25532 Pain in left wrist: Secondary | ICD-10-CM | POA: Diagnosis present

## 2015-08-18 DIAGNOSIS — M25531 Pain in right wrist: Secondary | ICD-10-CM | POA: Diagnosis not present

## 2015-08-18 DIAGNOSIS — K219 Gastro-esophageal reflux disease without esophagitis: Secondary | ICD-10-CM | POA: Diagnosis not present

## 2015-08-18 DIAGNOSIS — I209 Angina pectoris, unspecified: Secondary | ICD-10-CM | POA: Insufficient documentation

## 2015-08-18 DIAGNOSIS — Z7982 Long term (current) use of aspirin: Secondary | ICD-10-CM | POA: Diagnosis not present

## 2015-08-18 DIAGNOSIS — I10 Essential (primary) hypertension: Secondary | ICD-10-CM | POA: Insufficient documentation

## 2015-08-18 DIAGNOSIS — Z79899 Other long term (current) drug therapy: Secondary | ICD-10-CM | POA: Diagnosis not present

## 2015-08-18 DIAGNOSIS — I509 Heart failure, unspecified: Secondary | ICD-10-CM | POA: Insufficient documentation

## 2015-08-18 DIAGNOSIS — F419 Anxiety disorder, unspecified: Secondary | ICD-10-CM | POA: Diagnosis not present

## 2015-08-18 DIAGNOSIS — I252 Old myocardial infarction: Secondary | ICD-10-CM | POA: Diagnosis not present

## 2015-08-18 DIAGNOSIS — M10031 Idiopathic gout, right wrist: Secondary | ICD-10-CM | POA: Insufficient documentation

## 2015-08-18 DIAGNOSIS — J449 Chronic obstructive pulmonary disease, unspecified: Secondary | ICD-10-CM | POA: Diagnosis not present

## 2015-08-18 DIAGNOSIS — E119 Type 2 diabetes mellitus without complications: Secondary | ICD-10-CM | POA: Insufficient documentation

## 2015-08-18 DIAGNOSIS — M109 Gout, unspecified: Secondary | ICD-10-CM | POA: Diagnosis not present

## 2015-08-18 DIAGNOSIS — Z9889 Other specified postprocedural states: Secondary | ICD-10-CM | POA: Diagnosis not present

## 2015-08-18 DIAGNOSIS — Z792 Long term (current) use of antibiotics: Secondary | ICD-10-CM | POA: Insufficient documentation

## 2015-08-18 DIAGNOSIS — G4730 Sleep apnea, unspecified: Secondary | ICD-10-CM | POA: Diagnosis not present

## 2015-08-18 DIAGNOSIS — Z87442 Personal history of urinary calculi: Secondary | ICD-10-CM | POA: Insufficient documentation

## 2015-08-18 MED ORDER — LOSARTAN POTASSIUM 50 MG PO TABS
100.0000 mg | ORAL_TABLET | Freq: Once | ORAL | Status: AC
  Administered 2015-08-18: 100 mg via ORAL
  Filled 2015-08-18: qty 2

## 2015-08-18 MED ORDER — OXYCODONE-ACETAMINOPHEN 5-325 MG PO TABS: 1.0000 | ORAL_TABLET | Freq: Three times a day (TID) | ORAL | Status: DC | PRN

## 2015-08-18 MED ORDER — CLONIDINE HCL 0.1 MG PO TABS
0.1000 mg | ORAL_TABLET | Freq: Once | ORAL | Status: AC
  Administered 2015-08-18: 0.1 mg via ORAL
  Filled 2015-08-18: qty 1

## 2015-08-18 MED ORDER — KETOROLAC TROMETHAMINE 60 MG/2ML IM SOLN
60.0000 mg | Freq: Once | INTRAMUSCULAR | Status: AC
  Administered 2015-08-18: 60 mg via INTRAMUSCULAR
  Filled 2015-08-18: qty 2

## 2015-08-18 NOTE — ED Notes (Signed)
Pt states right wrist pain since last visit 11/06. Pt states he was on lawn mower and jammed his wrist/hand. States he was treated for gout but  Pain is no better.

## 2015-08-18 NOTE — Discharge Instructions (Signed)

## 2015-08-18 NOTE — ED Provider Notes (Signed)
CSN: [id number]     Arrival date & time 08/18/15  0840 History  By signing my name below, I, Hansel Feinstein, attest that this documentation has been prepared under the direction and in the presence of Julianne Rice, MD. Electronically Signed: Hansel Feinstein, ED Scribe. 08/18/2015. 9:10 AM.    Chief Complaint  Patient presents with  . Wrist Pain   The history is provided by the patient. No language interpreter was used.   HPI Comments: Eugene [name] is a 59 y.o. male with an extensive PMH, significant for gout who presents to the Emergency Department complaining of moderate right wrist pain and swelling for over a month, onset 07/03/15. Pt was seen for the same pain and swelling on 07/03/15, had arthrocentesis which showed uric r acid crystals and hand XR with no acute findings. He was then seen by his PCP; dx with gout and prescribed a prednisone taper and allopurinol. Pt reports that he was riding a lawn mower on 07/03/15 and jammed his wrist on the wheel. No other recent falls or injuries. Pt reports that his swelling has been gradually improving since onset. He notes that pain is worsened with movement. He states no relief with an ace wrap or wrist brace. Pt denies fever, additional injuries or symptoms.   Past Medical History  Diagnosis Date  . Arteriosclerotic cardiovascular disease (ASCVD)     a. 05/2011 Cath/PCI: LM nl, LAD 49m, D1 small, D2 small 47m, LCX large 40p, RCA 50-60p, 99 hazy @ origin of PDA with 70-80 in PDA (2.5x26 Resolute Integrity & 3.0x15 Resolute Integrity DES).;  b. 08/2012 Inflat  STEMI/Cath/PCI: LM minor irregs, LAD 50p, D1 50, LCX nl, OM1 25, RCA 30-40p, 100d (treated with 2.75x42mm Promus Premier DES);  c. 08/2012 Echo: EF 55-60%, basal inferopost HK.  [name] Hyperlipidemia   . Diabetes mellitus     Peripheral neuropathy  . Bell palsy   . Hypertension   . COPD (chronic obstructive pulmonary disease) ([place])   . Sleep apnea   . Gallstones   . Cholelithiasis 07/2012     Asymptomatic; identified incidentally  . PONV (postoperative nausea and vomiting)   . Anxiety   . C. difficile colitis     a. 08/2012  . Myocardial infarct ([place]) 09/08/12  . Nephrolithiasis   . Contrast dye induced nephropathy     a. 08/2012 post cath/pci  . CHF (congestive heart failure) ([place])   . Asthma   . Heart disease   . Old myocardial infarction   . Anginal pain ([place])   . GERD (gastroesophageal reflux disease)    Past Surgical History  Procedure Laterality Date  . Circumcision    . Stents    . Esophagogastroduodenoscopy      in [place] over 20 yrs ago  . Colonoscopy      In [facility], approximately 2011 per patient, was normal. Advised to come back in 10 years.  . Esophagogastroduodenoscopy  06/12/2012    [id number] esophagus-status post Venia Minks dilation. Abnormal gastric mucosa of uncertain significance-status post biopsy  . Cardiac catheterization    . Subxyphoid pericardial window N/A 06/23/2013    Procedure: SUBXYPHOID PERICARDIAL WINDOW;  Surgeon: Grace Isaac, MD;  Location: [facility];  Service: Thoracic;  Laterality: N/A;  . Intraoperative transesophageal echocardiogram N/A 06/23/2013    Procedure: INTRAOPERATIVE TRANSESOPHAGEAL ECHOCARDIOGRAM;  Surgeon: Grace Isaac, MD;  Location: [facility];  Service: Open Heart Surgery;  Laterality: N/A;  . Left heart catheterization with coronary angiogram N/A 09/08/2012  Procedure: LEFT HEART CATHETERIZATION WITH CORONARY ANGIOGRAM;  Surgeon: [name], MD;  Location: [facility] CATH LAB;  Service: Cardiovascular;  Laterality: N/A;  . Chest tube insertion     Family History  Problem Relation Age of Onset  . Diabetes Mother   . Heart attack Mother   . Stroke Mother   . Diabetes Sister   . Sleep apnea Sister   . Hypertension Brother   . Diabetes Brother   . Colon cancer Neg Hx   . Liver disease Neg Hx   . Diabetes Brother   . Hypertension Brother    Social History  Substance Use Topics  . Smoking  status: Never Smoker   . Smokeless tobacco: Never Used  . Alcohol Use: No     Comment: heavy etoh use 30 years ago    Review of Systems  Constitutional: Negative for fever and chills.  Respiratory: Negative for cough and shortness of breath.   Musculoskeletal: Positive for joint swelling and arthralgias.  Skin: Negative for rash.  Neurological: Negative for weakness and numbness.  All other systems reviewed and are negative.   Allergies  Hydrocodone; Lisinopril; Neurontin; Statins; Metformin and related; and Norvasc  Home Medications   Prior to Admission medications   Medication Sig Start Date End Date Taking? Authorizing Provider  acetaminophen (TYLENOL) 325 MG tablet Take 650 mg by mouth every 6 (six) hours as needed for mild pain.   Yes Historical Provider, MD  albuterol (PROVENTIL HFA;VENTOLIN HFA) 108 (90 BASE) MCG/ACT inhaler Inhale 2 puffs into the lungs every 4 (four) hours as needed for shortness of breath. 09/28/14  Yes [name], MD  allopurinol (ZYLOPRIM) 100 MG tablet Take 1 tablet (100 mg total) by mouth daily. 07/27/15  Yes [name], MD  ALPRAZolam Duanne Moron) 0.5 MG tablet TAKE 1/2-1 TABLET BY MOUTH AT BEDTIME AS NEEDED 07/29/15  Yes [name], MD  aspirin EC 81 MG tablet Take 81 mg by mouth at bedtime.    Yes Historical Provider, MD  BESIVANCE 0.6 % SUSP Place 1 drop into both eyes 2 (two) times daily. 04/27/15  Yes Historical Provider, MD  cloNIDine (CATAPRES) 0.1 MG tablet Take 1 tablet (0.1 mg total) by mouth 2 (two) times daily. 07/28/15  Yes Herminio Commons, MD  clopidogrel (PLAVIX) 75 MG tablet TAKE 1 TABLET AT BEDTIME 03/31/15  Yes [name], MD  colchicine (COLCRYS) 0.6 MG tablet 2 now then one bid until relief 07/04/15  Yes [name], MD  esomeprazole (NEXIUM) 20 MG capsule Take 20 mg by mouth daily as needed (for acid reflux).   Yes Historical Provider, MD  fenofibrate 160 MG tablet Take 1 tablet (160 mg total) by mouth at bedtime. 12/08/14   Yes [name], MD  hydrALAZINE (APRESOLINE) 50 MG tablet Take 1 tablet (50 mg total) by mouth 2 (two) times daily. 06/10/15  Yes Herminio Commons, MD  insulin glargine (LANTUS) 100 UNIT/ML injection Inject into the skin at bedtime. 80 Units qhs   Yes Historical Provider, MD  losartan (COZAAR) 100 MG tablet Take 100 mg by mouth daily. 05/28/15  Yes Historical Provider, MD  metoprolol tartrate (LOPRESSOR) 25 MG tablet Take 1 tablet (25 mg total) by mouth 2 (two) times daily. 04/06/15  Yes [name], MD  torsemide (DEMADEX) 20 MG tablet Take 2 tablets (40 mg total) by mouth daily. 06/10/15  Yes Herminio Commons, MD  trimethoprim-polymyxin b (POLYTRIM) ophthalmic solution Place 1 drop into both  eyes daily as needed. For irritation 06/22/15  Yes Historical Provider, MD  oxyCODONE-acetaminophen (ROXICET) 5-325 MG tablet Take 1-2 tablets by mouth every 8 (eight) hours as needed for severe pain. 08/18/15   Julianne Rice, MD   BP 166/90 mmHg  Pulse 80  Temp(Src) 98 F (36.7 C) (Oral)  Resp 16  Ht 6\' 1"  (1.854 m)  Wt 269 lb (122.018 kg)  BMI 35.50 kg/m2  SpO2 100% Physical Exam  Constitutional: He is oriented to person, place, and time. He appears well-developed and well-nourished. No distress.  HENT:  Head: Normocephalic and atraumatic.  Eyes: EOM are normal. Pupils are equal, round, and reactive to light.  Neck: Normal range of motion. Neck supple.  Cardiovascular: Normal rate and regular rhythm.   Pulmonary/Chest: Effort normal and breath sounds normal.  Abdominal: Soft. Bowel sounds are normal.  Musculoskeletal: He exhibits tenderness. He exhibits no edema.  Patient with diffuse right wrist swelling. Mild warmth. Pain with range of motion. Good distal cap refill.  Neurological: He is alert and oriented to person, place, and time.  Dentition intact bilaterally. Difficult to assess grip strength due to pain. No proximal extremity weakness.  Skin: Skin is warm and dry. No rash  noted. No erythema.  Psychiatric: He has a normal mood and affect. His behavior is normal.  Nursing note and vitals reviewed.   ED Course  Procedures (including critical care time) DIAGNOSTIC STUDIES: Oxygen Saturation is 98% on RA, normal by my interpretation.    COORDINATION OF CARE: 9:09 AM Discussed treatment plan with pt at bedside and pt agreed to plan.  11:02 AM Pt to be d/c with wrist splint and follow up with orthopedist.   Imaging Review Dg Wrist Complete Right  08/18/2015  CLINICAL DATA:  Right wrist pain. EXAM: RIGHT WRIST - COMPLETE 3+ VIEW COMPARISON:  None FINDINGS: There is no evidence of fracture or dislocation. There is no evidence of arthropathy or other focal bone abnormality. Soft tissues are unremarkable. Vascular calcifications noted. IMPRESSION: Negative. Electronically Signed   By: Kerby Moors M.D.   On: 08/18/2015 09:39   I have personally reviewed and evaluated these images as part of my medical decision-making.  MDM   Final diagnoses:  Gout of right wrist, unspecified cause, unspecified chronicity    I personally performed the services described in this documentation, which was scribed in my presence. The recorded information has been reviewed and is accurate.   Without any bony abnormalities. Patient is well-appearing. Placed in a wrist splint for comfort. Advised to follow-up with his primary physician.  Julianne Rice, MD 08/20/15 [phone]

## 2015-08-19 ENCOUNTER — Encounter (INDEPENDENT_AMBULATORY_CARE_PROVIDER_SITE_OTHER): Payer: Commercial Managed Care - HMO | Admitting: Ophthalmology

## 2015-08-19 DIAGNOSIS — H43813 Vitreous degeneration, bilateral: Secondary | ICD-10-CM | POA: Diagnosis not present

## 2015-08-19 DIAGNOSIS — H35033 Hypertensive retinopathy, bilateral: Secondary | ICD-10-CM | POA: Diagnosis not present

## 2015-08-19 DIAGNOSIS — I10 Essential (primary) hypertension: Secondary | ICD-10-CM | POA: Diagnosis not present

## 2015-08-19 DIAGNOSIS — E11311 Type 2 diabetes mellitus with unspecified diabetic retinopathy with macular edema: Secondary | ICD-10-CM

## 2015-08-19 DIAGNOSIS — E113512 Type 2 diabetes mellitus with proliferative diabetic retinopathy with macular edema, left eye: Secondary | ICD-10-CM | POA: Diagnosis not present

## 2015-08-19 DIAGNOSIS — D3132 Benign neoplasm of left choroid: Secondary | ICD-10-CM

## 2015-08-19 DIAGNOSIS — E113311 Type 2 diabetes mellitus with moderate nonproliferative diabetic retinopathy with macular edema, right eye: Secondary | ICD-10-CM

## 2015-08-19 DIAGNOSIS — H2513 Age-related nuclear cataract, bilateral: Secondary | ICD-10-CM | POA: Diagnosis not present

## 2015-08-23 DIAGNOSIS — M109 Gout, unspecified: Secondary | ICD-10-CM | POA: Diagnosis not present

## 2015-08-24 LAB — BASIC METABOLIC PANEL
BUN / CREAT RATIO: 23 — AB (ref 9–20)
BUN: 24 mg/dL (ref 6–24)
CO2: 20 mmol/L (ref 18–29)
CREATININE: 1.05 mg/dL (ref 0.76–1.27)
Calcium: 9.4 mg/dL (ref 8.7–10.2)
Chloride: 101 mmol/L (ref 96–106)
GFR calc Af Amer: 90 mL/min/{1.73_m2} (ref >59)
GFR, EST NON AFRICAN AMERICAN: 78 mL/min/{1.73_m2} (ref >59)
Glucose: 146 mg/dL — ABNORMAL HIGH (ref 65–99)
Potassium: 4.6 mmol/L (ref 3.5–5.2)
SODIUM: 143 mmol/L (ref 134–144)

## 2015-08-24 LAB — URIC ACID: Uric Acid: 10.2 mg/dL — ABNORMAL HIGH (ref 3.7–8.6)

## 2015-08-25 ENCOUNTER — Other Ambulatory Visit: Payer: Self-pay | Admitting: *Deleted

## 2015-08-25 MED ORDER — ALLOPURINOL 100 MG PO TABS: 100.0000 mg | ORAL_TABLET | Freq: Two times a day (BID) | ORAL | Status: DC

## 2015-09-07 ENCOUNTER — Other Ambulatory Visit: Payer: Self-pay

## 2015-09-07 NOTE — Patient Outreach (Signed)
[facility]) Care Management  09/07/2015  [place] [birth year] [id number]   Return telephone call to patient after receiving a message from patient concerning needing help to pay for his insulin.  No answer.  HIPAA compliant voice message left.    Plan: RN Health Coach will wait for patient return call.  Jone Baseman, RN, MSN [place] [phone]

## 2015-09-12 ENCOUNTER — Other Ambulatory Visit: Payer: Self-pay

## 2015-09-12 DIAGNOSIS — IMO0002 Reserved for concepts with insufficient information to code with codable children: Secondary | ICD-10-CM

## 2015-09-12 DIAGNOSIS — E1065 Type 1 diabetes mellitus with hyperglycemia: Secondary | ICD-10-CM

## 2015-09-12 DIAGNOSIS — E108 Type 1 diabetes mellitus with unspecified complications: Principal | ICD-10-CM

## 2015-09-12 NOTE — Patient Outreach (Signed)
[facility]) Care Management  09/12/2015  [place] 09/12/1955 [id number]   Incoming return call from patient for assistance.  Patient reports he wanted help with applying to the patient assistance program as he was a part of last year. Patient reports he is doing well with his diabetes and heart failure.  Denies any other needs at this time except for the patient assistance program.    Plan: [place] will refer patient to pharmacy for help with patient assistance program.    Jone Baseman, RN, MSN [place] [phone]

## 2015-09-22 ENCOUNTER — Encounter (INDEPENDENT_AMBULATORY_CARE_PROVIDER_SITE_OTHER): Payer: Commercial Managed Care - HMO | Admitting: Ophthalmology

## 2015-09-23 ENCOUNTER — Other Ambulatory Visit: Payer: Self-pay | Admitting: *Deleted

## 2015-09-23 MED ORDER — CLOPIDOGREL BISULFATE 75 MG PO TABS: 75.0000 mg | ORAL_TABLET | Freq: Every day | ORAL | Status: DC

## 2015-09-23 MED ORDER — LOSARTAN POTASSIUM 100 MG PO TABS: 100.0000 mg | ORAL_TABLET | Freq: Every day | ORAL | Status: DC

## 2015-09-28 ENCOUNTER — Telehealth: Payer: Self-pay | Admitting: Family Medicine

## 2015-09-28 ENCOUNTER — Other Ambulatory Visit: Payer: Self-pay | Admitting: *Deleted

## 2015-09-28 MED ORDER — CLONIDINE HCL 0.1 MG PO TABS: 0.1000 mg | ORAL_TABLET | Freq: Two times a day (BID) | ORAL | Status: DC

## 2015-09-28 MED ORDER — OXYCODONE-ACETAMINOPHEN 5-325 MG PO TABS: 1.0000 | ORAL_TABLET | Freq: Three times a day (TID) | ORAL | Status: DC | PRN

## 2015-09-28 NOTE — Telephone Encounter (Signed)
May have rx for 15 tablets folllow up if needing more etc

## 2015-09-28 NOTE — Telephone Encounter (Signed)
oxyCODONE-acetaminophen (ROXICET) 5-325 MG tablet  Pt needs refill on this please   Call when ready to pick up

## 2015-09-28 NOTE — Telephone Encounter (Signed)
Patient notified script ready for pickup. Follow up for further

## 2015-09-29 ENCOUNTER — Encounter (INDEPENDENT_AMBULATORY_CARE_PROVIDER_SITE_OTHER): Payer: Commercial Managed Care - HMO | Admitting: Ophthalmology

## 2015-09-29 DIAGNOSIS — D3132 Benign neoplasm of left choroid: Secondary | ICD-10-CM

## 2015-09-29 DIAGNOSIS — E113311 Type 2 diabetes mellitus with moderate nonproliferative diabetic retinopathy with macular edema, right eye: Secondary | ICD-10-CM

## 2015-09-29 DIAGNOSIS — I10 Essential (primary) hypertension: Secondary | ICD-10-CM | POA: Diagnosis not present

## 2015-09-29 DIAGNOSIS — H43813 Vitreous degeneration, bilateral: Secondary | ICD-10-CM

## 2015-09-29 DIAGNOSIS — H2513 Age-related nuclear cataract, bilateral: Secondary | ICD-10-CM

## 2015-09-29 DIAGNOSIS — H35033 Hypertensive retinopathy, bilateral: Secondary | ICD-10-CM | POA: Diagnosis not present

## 2015-09-29 DIAGNOSIS — E11311 Type 2 diabetes mellitus with unspecified diabetic retinopathy with macular edema: Secondary | ICD-10-CM

## 2015-09-29 DIAGNOSIS — E113512 Type 2 diabetes mellitus with proliferative diabetic retinopathy with macular edema, left eye: Secondary | ICD-10-CM

## 2015-10-05 ENCOUNTER — Other Ambulatory Visit: Payer: Self-pay

## 2015-10-05 NOTE — Patient Outreach (Signed)
I called Mr. Art to determine his need for Lantus.  He stated he wanted to get started on the patient assistance application for Lantus.  I asked if he had spent 5% of his annual income out of pocket on his medications since that was the requirement.  He stated he had not.  I asked for him to call me when he was close to the donut hole or when he had spent 5% of his annual income on his medications.  He stated he would.  He appreciated our help in the past and appreciated the help today.  I am closing him to pharmacy.  I will be happy to assist in the future if he has any further pharmacy issues.    Deanne Coffer, PharmD, [place] [phone]

## 2015-10-13 ENCOUNTER — Other Ambulatory Visit (INDEPENDENT_AMBULATORY_CARE_PROVIDER_SITE_OTHER): Payer: Commercial Managed Care - HMO | Admitting: Ophthalmology

## 2015-10-13 DIAGNOSIS — E11311 Type 2 diabetes mellitus with unspecified diabetic retinopathy with macular edema: Secondary | ICD-10-CM | POA: Diagnosis not present

## 2015-10-13 DIAGNOSIS — E113311 Type 2 diabetes mellitus with moderate nonproliferative diabetic retinopathy with macular edema, right eye: Secondary | ICD-10-CM

## 2015-10-20 ENCOUNTER — Other Ambulatory Visit: Payer: Self-pay | Admitting: Family Medicine

## 2015-10-21 NOTE — Telephone Encounter (Signed)
Patient may have this in for additional refills

## 2015-10-24 ENCOUNTER — Other Ambulatory Visit (INDEPENDENT_AMBULATORY_CARE_PROVIDER_SITE_OTHER): Payer: Commercial Managed Care - HMO | Admitting: Ophthalmology

## 2015-10-24 DIAGNOSIS — E11311 Type 2 diabetes mellitus with unspecified diabetic retinopathy with macular edema: Secondary | ICD-10-CM | POA: Diagnosis not present

## 2015-10-24 DIAGNOSIS — E113512 Type 2 diabetes mellitus with proliferative diabetic retinopathy with macular edema, left eye: Secondary | ICD-10-CM

## 2015-10-26 ENCOUNTER — Other Ambulatory Visit (INDEPENDENT_AMBULATORY_CARE_PROVIDER_SITE_OTHER): Payer: Commercial Managed Care - HMO | Admitting: Ophthalmology

## 2015-10-27 ENCOUNTER — Ambulatory Visit: Payer: Commercial Managed Care - HMO | Admitting: Cardiovascular Disease

## 2015-10-27 ENCOUNTER — Encounter (INDEPENDENT_AMBULATORY_CARE_PROVIDER_SITE_OTHER): Payer: Commercial Managed Care - HMO | Admitting: Ophthalmology

## 2015-10-27 DIAGNOSIS — D3132 Benign neoplasm of left choroid: Secondary | ICD-10-CM | POA: Diagnosis not present

## 2015-10-27 DIAGNOSIS — H35033 Hypertensive retinopathy, bilateral: Secondary | ICD-10-CM

## 2015-10-27 DIAGNOSIS — H43813 Vitreous degeneration, bilateral: Secondary | ICD-10-CM | POA: Diagnosis not present

## 2015-10-27 DIAGNOSIS — E113512 Type 2 diabetes mellitus with proliferative diabetic retinopathy with macular edema, left eye: Secondary | ICD-10-CM

## 2015-10-27 DIAGNOSIS — I10 Essential (primary) hypertension: Secondary | ICD-10-CM | POA: Diagnosis not present

## 2015-10-27 DIAGNOSIS — E11311 Type 2 diabetes mellitus with unspecified diabetic retinopathy with macular edema: Secondary | ICD-10-CM

## 2015-10-27 DIAGNOSIS — E113311 Type 2 diabetes mellitus with moderate nonproliferative diabetic retinopathy with macular edema, right eye: Secondary | ICD-10-CM | POA: Diagnosis not present

## 2015-10-27 LAB — HM DIABETES EYE EXAM

## 2015-11-01 ENCOUNTER — Encounter: Payer: Self-pay | Admitting: Cardiovascular Disease

## 2015-11-01 ENCOUNTER — Ambulatory Visit (INDEPENDENT_AMBULATORY_CARE_PROVIDER_SITE_OTHER): Payer: Commercial Managed Care - HMO | Admitting: Cardiovascular Disease

## 2015-11-01 VITALS — BP 172/84 | HR 69 | Ht 73.0 in | Wt 272.0 lb

## 2015-11-01 DIAGNOSIS — R400 Somnolence: Secondary | ICD-10-CM

## 2015-11-01 DIAGNOSIS — E785 Hyperlipidemia, unspecified: Secondary | ICD-10-CM

## 2015-11-01 DIAGNOSIS — Z955 Presence of coronary angioplasty implant and graft: Secondary | ICD-10-CM

## 2015-11-01 DIAGNOSIS — I10 Essential (primary) hypertension: Secondary | ICD-10-CM | POA: Diagnosis not present

## 2015-11-01 DIAGNOSIS — G4733 Obstructive sleep apnea (adult) (pediatric): Secondary | ICD-10-CM

## 2015-11-01 DIAGNOSIS — I319 Disease of pericardium, unspecified: Secondary | ICD-10-CM

## 2015-11-01 DIAGNOSIS — R0989 Other specified symptoms and signs involving the circulatory and respiratory systems: Secondary | ICD-10-CM | POA: Diagnosis not present

## 2015-11-01 DIAGNOSIS — I313 Pericardial effusion (noninflammatory): Secondary | ICD-10-CM

## 2015-11-01 DIAGNOSIS — I5032 Chronic diastolic (congestive) heart failure: Secondary | ICD-10-CM

## 2015-11-01 DIAGNOSIS — E782 Mixed hyperlipidemia: Secondary | ICD-10-CM

## 2015-11-01 DIAGNOSIS — G4710 Hypersomnia, unspecified: Secondary | ICD-10-CM

## 2015-11-01 DIAGNOSIS — R600 Localized edema: Secondary | ICD-10-CM

## 2015-11-01 DIAGNOSIS — I3139 Other pericardial effusion (noninflammatory): Secondary | ICD-10-CM

## 2015-11-01 DIAGNOSIS — I2510 Atherosclerotic heart disease of native coronary artery without angina pectoris: Secondary | ICD-10-CM

## 2015-11-01 DIAGNOSIS — I252 Old myocardial infarction: Secondary | ICD-10-CM

## 2015-11-01 NOTE — Patient Instructions (Signed)
Your physician has requested that you have a carotid duplex. This test is an ultrasound of the carotid arteries in your neck. It looks at blood flow through these arteries that supply the brain with blood. Allow one hour for this exam. There are no restrictions or special instructions. Lab for Lipids - order given today.  Reminder:  Nothing to eat or drink after 12 midnight prior to labs.  Office will contact with results via phone or letter.   Your physician wants you to follow up in: 6 months.  You will receive a reminder letter in the mail one-two months in advance.  If you don't receive a letter, please call our office to schedule the follow up appointment

## 2015-11-01 NOTE — Progress Notes (Signed)
Patient ID: Eugene [name], male   DOB: Jan 15, 1956, 60 y.o.   MRN: [id number]      SUBJECTIVE: Mr. Hanauer presents for follow-up of chronic diastolic heart failure, malignant hypertension, and coronary artery disease.  Echocardiogram on 9 /29/16 demonstrated normal left ventricular systolic function and regional wall motion, LVEF 60-65%, severe LVH, grade 2 diastolic dysfunction, moderate left atrial and mild right atrial dilatation, dilated IVC , and a mild to moderate circumferential pericardial effusion with no evidence of tamponade physiology.  He feels very well and denies chest pain, palpitations, nor worsening of baseline shortness of breath. He feels sleepy often. Hasn't had CPAP checked since he first got it.   he has been measuring his blood pressure at home and it remains relatively well controlled overall.  A friend passed away and his funeral is today and the patient said he is under a lot of stress and believes this is why his blood pressure is elevated.   Review of Systems: As per "subjective", otherwise negative.  Allergies  Allergen Reactions  . Hydrocodone Nausea And Vomiting  . Lisinopril Cough  . Neurontin [Gabapentin] Other (See Comments)    Suicidal thoughts  . Statins Other (See Comments)    Muscle aches  . Metformin And Related Diarrhea and Other (See Comments)    Intestinal side effects  . Norvasc [Amlodipine Besylate] Swelling    Pedal edema    Current Outpatient Prescriptions  Medication Sig Dispense Refill  . acetaminophen (TYLENOL) 325 MG tablet Take 650 mg by mouth every 6 (six) hours as needed for mild pain.    [name] albuterol (PROVENTIL HFA;VENTOLIN HFA) 108 (90 BASE) MCG/ACT inhaler Inhale 2 puffs into the lungs every 4 (four) hours as needed for shortness of breath. 1 Inhaler 2  . allopurinol (ZYLOPRIM) 100 MG tablet Take 1 tablet (100 mg total) by mouth 2 (two) times daily. 60 tablet 5  . ALPRAZolam (XANAX) 0.5 MG tablet TAKE 1/2-1 TABLET BY  MOUTH AT BEDTIME AS NEEDED 30 tablet 4  . aspirin EC 81 MG tablet Take 81 mg by mouth at bedtime.     [name] BESIVANCE 0.6 % SUSP Place 1 drop into both eyes 2 (two) times daily.  12  . cloNIDine (CATAPRES) 0.1 MG tablet Take 1 tablet (0.1 mg total) by mouth 2 (two) times daily. 180 tablet 3  . clopidogrel (PLAVIX) 75 MG tablet Take 1 tablet (75 mg total) by mouth at bedtime. 90 tablet 1  . colchicine (COLCRYS) 0.6 MG tablet 2 now then one bid until relief 20 tablet 3  . esomeprazole (NEXIUM) 20 MG capsule Take 20 mg by mouth daily as needed (for acid reflux).    . fenofibrate 160 MG tablet Take 1 tablet (160 mg total) by mouth at bedtime. 30 tablet 5  . hydrALAZINE (APRESOLINE) 50 MG tablet Take 1 tablet (50 mg total) by mouth 2 (two) times daily. 180 tablet 1  . insulin glargine (LANTUS) 100 UNIT/ML injection Inject into the skin at bedtime. 80 Units qhs    . losartan (COZAAR) 100 MG tablet Take 1 tablet (100 mg total) by mouth daily. 90 tablet 1  . metoprolol tartrate (LOPRESSOR) 25 MG tablet Take 1 tablet (25 mg total) by mouth 2 (two) times daily. 180 tablet 3  . oxyCODONE-acetaminophen (ROXICET) 5-325 MG tablet Take 1-2 tablets by mouth every 8 (eight) hours as needed for severe pain. 15 tablet 0  . torsemide (DEMADEX) 20 MG tablet Take 2 tablets (40 mg total) by  mouth daily. 180 tablet 1  . traMADol (ULTRAM) 50 MG tablet TAKE 1 TABLET BY MOUTH EVERY 6 HOURS AS NEEDED FOR PAIN. 24 tablet 4  . trimethoprim-polymyxin b (POLYTRIM) ophthalmic solution Place 1 drop into both eyes daily as needed. For irritation  12   No current facility-administered medications for this visit.    Past Medical History  Diagnosis Date  . Arteriosclerotic cardiovascular disease (ASCVD)     a. 05/2011 Cath/PCI: LM nl, LAD 48m, D1 small, D2 small 12m, LCX large 40p, RCA 50-60p, 99 hazy @ origin of PDA with 70-80 in PDA (2.5x26 Resolute Integrity & 3.0x15 Resolute Integrity DES).;  b. 08/2012 Inflat  STEMI/Cath/PCI: LM  minor irregs, LAD 50p, D1 50, LCX nl, OM1 25, RCA 30-40p, 100d (treated with 2.75x29mm Promus Premier DES);  c. 08/2012 Echo: EF 55-60%, basal inferopost HK.  [name] Hyperlipidemia   . Diabetes mellitus     Peripheral neuropathy  . Bell palsy   . Hypertension   . COPD (chronic obstructive pulmonary disease) ([place])   . Sleep apnea   . Gallstones   . Cholelithiasis 07/2012    Asymptomatic; identified incidentally  . PONV (postoperative nausea and vomiting)   . Anxiety   . C. difficile colitis     a. 08/2012  . Myocardial infarct ([place]) 09/08/12  . Nephrolithiasis   . Contrast dye induced nephropathy     a. 08/2012 post cath/pci  . CHF (congestive heart failure) ([place])   . Asthma   . Heart disease   . Old myocardial infarction   . Anginal pain ([place])   . GERD (gastroesophageal reflux disease)     Past Surgical History  Procedure Laterality Date  . Circumcision    . Stents    . Esophagogastroduodenoscopy      in [place] over 20 yrs ago  . Colonoscopy      In [facility], approximately 2011 per patient, was normal. Advised to come back in 10 years.  . Esophagogastroduodenoscopy  06/12/2012    [id number] esophagus-status post Venia Minks dilation. Abnormal gastric mucosa of uncertain significance-status post biopsy  . Cardiac catheterization    . Subxyphoid pericardial window N/A 06/23/2013    Procedure: SUBXYPHOID PERICARDIAL WINDOW;  Surgeon: Grace Isaac, MD;  Location: [facility];  Service: Thoracic;  Laterality: N/A;  . Intraoperative transesophageal echocardiogram N/A 06/23/2013    Procedure: INTRAOPERATIVE TRANSESOPHAGEAL ECHOCARDIOGRAM;  Surgeon: Grace Isaac, MD;  Location: [facility];  Service: Open Heart Surgery;  Laterality: N/A;  . Left heart catheterization with coronary angiogram N/A 09/08/2012    Procedure: LEFT HEART CATHETERIZATION WITH CORONARY ANGIOGRAM;  Surgeon: [name], MD;  Location: [facility] CATH LAB;  Service: Cardiovascular;  Laterality: N/A;  . Chest  tube insertion      Social History   Social History  . Marital Status: Married    Spouse Name: N/A  . Number of Children: 2  . Years of Education: N/A   Occupational History  . Shipping     [employer]   Social History Main Topics  . Smoking status: Never Smoker   . Smokeless tobacco: Never Used  . Alcohol Use: No     Comment: heavy etoh use 30 years ago  . Drug Use: No  . Sexual Activity: Yes    Birth Control/ Protection: None   Other Topics Concern  . 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 Disabled at this point.  Filed Vitals:   11/01/15 1305  BP: 172/84  Pulse: 69  Height: 6\' 1"  (1.854 m)  Weight: 272 lb (123.378 kg)  SpO2: 95%    PHYSICAL EXAM General: NAD HEENT: Normal. Neck: No JVD, no thyromegaly. Lungs: Clear to auscultation bilaterally with normal respiratory effort. CV: Nondisplaced PMI. Regular rate and rhythm, normal S1/S2, no S3/S4, no murmur. Trace pretibial and periankle edema, L>R. Right carotid bruit. Abdomen: Firm, obese, nontender.  Neurologic: Alert and oriented x 3.  Psych: Normal affect. Skin: Normal.  ECG: Most recent ECG reviewed.      ASSESSMENT AND PLAN: 1. CAD s/p multiple PCI's to RCA: Stable ischemic heart disease. Continue aspirin, metoprolol, and Plavix. Not amenable to statin therapy. He previously told me he preferred to hold off on Zetia and wanted to continue to pursue therapeutic lifestyle changes.   2. Malignant HTN: Elevated today but reasonably controlled at home. Continue present therapy.  3. Chronic diastolic heart failure: Euvolemic. Continue low-sodium and fluid-restricted diet. Continue compression stocking use. BP control of prime importance.  4. Mixed dyslipidemia: Markedly elevated lipids on 01/18/15 in context of diabetes and CAD. Intolerant to statins (muscle aches). On fenofibrate. He would prefer to hold off on Zetia and wants to  continue to pursue therapeutic lifestyle changes. Will obtain lipids.  5. Pericardial effusion s/p pericardial window: Small to medium in size and stable in 04/2015. Continue to monitor.   6. Right carotid bruit: Normal on 09/12/04. Will repeat Dopplers.  7. Daytime somnolence: Would recommend he get his CPAP checked and perhaps refitted. Will defer to PCP.  Dispo: f/u 6 months.   Kate Sable, M.D., F.A.C.C.

## 2015-11-09 ENCOUNTER — Emergency Department (HOSPITAL_COMMUNITY): Payer: Commercial Managed Care - HMO

## 2015-11-09 ENCOUNTER — Encounter (HOSPITAL_COMMUNITY): Payer: Self-pay

## 2015-11-09 ENCOUNTER — Inpatient Hospital Stay (HOSPITAL_COMMUNITY)
Admission: EM | Admit: 2015-11-09 | Discharge: 2015-11-13 | DRG: 305 | Disposition: A | Discharge status: Home / Self Care | Payer: Commercial Managed Care - HMO | Attending: Internal Medicine | Admitting: Internal Medicine

## 2015-11-09 DIAGNOSIS — R0989 Other specified symptoms and signs involving the circulatory and respiratory systems: Secondary | ICD-10-CM | POA: Diagnosis present

## 2015-11-09 DIAGNOSIS — E669 Obesity, unspecified: Secondary | ICD-10-CM | POA: Diagnosis present

## 2015-11-09 DIAGNOSIS — I130 Hypertensive heart and chronic kidney disease with heart failure and stage 1 through stage 4 chronic kidney disease, or unspecified chronic kidney disease: Secondary | ICD-10-CM | POA: Diagnosis present

## 2015-11-09 DIAGNOSIS — Z8249 Family history of ischemic heart disease and other diseases of the circulatory system: Secondary | ICD-10-CM

## 2015-11-09 DIAGNOSIS — E1142 Type 2 diabetes mellitus with diabetic polyneuropathy: Secondary | ICD-10-CM | POA: Diagnosis present

## 2015-11-09 DIAGNOSIS — N2889 Other specified disorders of kidney and ureter: Secondary | ICD-10-CM

## 2015-11-09 DIAGNOSIS — I10 Essential (primary) hypertension: Secondary | ICD-10-CM | POA: Diagnosis not present

## 2015-11-09 DIAGNOSIS — N183 Chronic kidney disease, stage 3 unspecified: Secondary | ICD-10-CM

## 2015-11-09 DIAGNOSIS — Z955 Presence of coronary angioplasty implant and graft: Secondary | ICD-10-CM

## 2015-11-09 DIAGNOSIS — I252 Old myocardial infarction: Secondary | ICD-10-CM | POA: Diagnosis not present

## 2015-11-09 DIAGNOSIS — G4733 Obstructive sleep apnea (adult) (pediatric): Secondary | ICD-10-CM | POA: Diagnosis present

## 2015-11-09 DIAGNOSIS — Z6835 Body mass index (BMI) 35.0-35.9, adult: Secondary | ICD-10-CM

## 2015-11-09 DIAGNOSIS — I5032 Chronic diastolic (congestive) heart failure: Secondary | ICD-10-CM

## 2015-11-09 DIAGNOSIS — E785 Hyperlipidemia, unspecified: Secondary | ICD-10-CM | POA: Diagnosis present

## 2015-11-09 DIAGNOSIS — Z7982 Long term (current) use of aspirin: Secondary | ICD-10-CM | POA: Diagnosis not present

## 2015-11-09 DIAGNOSIS — Z87442 Personal history of urinary calculi: Secondary | ICD-10-CM

## 2015-11-09 DIAGNOSIS — N189 Chronic kidney disease, unspecified: Secondary | ICD-10-CM | POA: Diagnosis not present

## 2015-11-09 DIAGNOSIS — Z7902 Long term (current) use of antithrombotics/antiplatelets: Secondary | ICD-10-CM

## 2015-11-09 DIAGNOSIS — J449 Chronic obstructive pulmonary disease, unspecified: Secondary | ICD-10-CM | POA: Diagnosis present

## 2015-11-09 DIAGNOSIS — I25119 Atherosclerotic heart disease of native coronary artery with unspecified angina pectoris: Secondary | ICD-10-CM | POA: Diagnosis not present

## 2015-11-09 DIAGNOSIS — J45909 Unspecified asthma, uncomplicated: Secondary | ICD-10-CM | POA: Diagnosis present

## 2015-11-09 DIAGNOSIS — I2510 Atherosclerotic heart disease of native coronary artery without angina pectoris: Secondary | ICD-10-CM | POA: Diagnosis present

## 2015-11-09 DIAGNOSIS — R51 Headache: Secondary | ICD-10-CM | POA: Diagnosis present

## 2015-11-09 DIAGNOSIS — Z833 Family history of diabetes mellitus: Secondary | ICD-10-CM | POA: Diagnosis not present

## 2015-11-09 DIAGNOSIS — K219 Gastro-esophageal reflux disease without esophagitis: Secondary | ICD-10-CM | POA: Diagnosis present

## 2015-11-09 DIAGNOSIS — H538 Other visual disturbances: Secondary | ICD-10-CM | POA: Diagnosis not present

## 2015-11-09 DIAGNOSIS — E1122 Type 2 diabetes mellitus with diabetic chronic kidney disease: Secondary | ICD-10-CM | POA: Diagnosis not present

## 2015-11-09 DIAGNOSIS — Z794 Long term (current) use of insulin: Secondary | ICD-10-CM | POA: Diagnosis not present

## 2015-11-09 DIAGNOSIS — E1149 Type 2 diabetes mellitus with other diabetic neurological complication: Secondary | ICD-10-CM

## 2015-11-09 DIAGNOSIS — R079 Chest pain, unspecified: Secondary | ICD-10-CM | POA: Diagnosis not present

## 2015-11-09 DIAGNOSIS — R0789 Other chest pain: Secondary | ICD-10-CM | POA: Diagnosis not present

## 2015-11-09 DIAGNOSIS — N179 Acute kidney failure, unspecified: Secondary | ICD-10-CM | POA: Diagnosis present

## 2015-11-09 DIAGNOSIS — Z9114 Patient's other noncompliance with medication regimen: Secondary | ICD-10-CM | POA: Diagnosis not present

## 2015-11-09 DIAGNOSIS — I160 Hypertensive urgency: Secondary | ICD-10-CM | POA: Diagnosis present

## 2015-11-09 DIAGNOSIS — R072 Precordial pain: Secondary | ICD-10-CM | POA: Diagnosis not present

## 2015-11-09 DIAGNOSIS — D649 Anemia, unspecified: Secondary | ICD-10-CM | POA: Diagnosis present

## 2015-11-09 DIAGNOSIS — Z823 Family history of stroke: Secondary | ICD-10-CM | POA: Diagnosis not present

## 2015-11-09 DIAGNOSIS — I6523 Occlusion and stenosis of bilateral carotid arteries: Secondary | ICD-10-CM | POA: Diagnosis not present

## 2015-11-09 LAB — CBC WITH DIFFERENTIAL/PLATELET
BASOS PCT: 0 %
Basophils Absolute: 0 10*3/uL (ref 0.0–0.1)
EOS ABS: 0.1 10*3/uL (ref 0.0–0.7)
EOS PCT: 2 %
HCT: 37.3 % — ABNORMAL LOW (ref 39.0–52.0)
HEMOGLOBIN: 12.5 g/dL — AB (ref 13.0–17.0)
Lymphocytes Relative: 27 %
Lymphs Abs: 1.9 10*3/uL (ref 0.7–4.0)
MCH: 29.3 pg (ref 26.0–34.0)
MCHC: 33.5 g/dL (ref 30.0–36.0)
MCV: 87.4 fL (ref 78.0–100.0)
MONOS PCT: 6 %
Monocytes Absolute: 0.4 10*3/uL (ref 0.1–1.0)
NEUTROS PCT: 65 %
Neutro Abs: 4.4 10*3/uL (ref 1.7–7.7)
PLATELETS: 187 10*3/uL (ref 150–400)
RBC: 4.27 MIL/uL (ref 4.22–5.81)
RDW: 13.8 % (ref 11.5–15.5)
WBC: 6.8 10*3/uL (ref 4.0–10.5)

## 2015-11-09 LAB — BASIC METABOLIC PANEL
Anion gap: 7 (ref 5–15)
BUN: 32 mg/dL — ABNORMAL HIGH (ref 6–20)
CHLORIDE: 98 mmol/L — AB (ref 101–111)
CO2: 29 mmol/L (ref 22–32)
CREATININE: 1.26 mg/dL — AB (ref 0.61–1.24)
Calcium: 8.8 mg/dL — ABNORMAL LOW (ref 8.9–10.3)
GFR calc non Af Amer: >60 mL/min (ref >60)
Glucose, Bld: 225 mg/dL — ABNORMAL HIGH (ref 65–99)
Potassium: 4.5 mmol/L (ref 3.5–5.1)
SODIUM: 134 mmol/L — AB (ref 135–145)

## 2015-11-09 LAB — TROPONIN I: Troponin I: 0.04 ng/mL — ABNORMAL HIGH (ref <0.031)

## 2015-11-09 LAB — BRAIN NATRIURETIC PEPTIDE: B Natriuretic Peptide: 69 pg/mL (ref 0.0–100.0)

## 2015-11-09 MED ORDER — HYDRALAZINE HCL 20 MG/ML IJ SOLN
10.0000 mg | INTRAMUSCULAR | Status: AC
  Administered 2015-11-09: 10 mg via INTRAVENOUS
  Filled 2015-11-09: qty 1

## 2015-11-09 MED ORDER — INSULIN ASPART 100 UNIT/ML ~~LOC~~ SOLN
0.0000 [IU] | Freq: Every day | SUBCUTANEOUS | Status: DC
  Administered 2015-11-11: 2 [IU] via SUBCUTANEOUS

## 2015-11-09 MED ORDER — NITROGLYCERIN IN D5W 200-5 MCG/ML-% IV SOLN
20.0000 ug/min | Freq: Once | INTRAVENOUS | Status: AC
  Administered 2015-11-09: 20 ug/min via INTRAVENOUS
  Filled 2015-11-09: qty 250

## 2015-11-09 MED ORDER — NITROGLYCERIN IN D5W 200-5 MCG/ML-% IV SOLN: 0.0000 ug/min | INTRAVENOUS | Status: DC

## 2015-11-09 MED ORDER — INSULIN ASPART 100 UNIT/ML ~~LOC~~ SOLN
0.0000 [IU] | Freq: Three times a day (TID) | SUBCUTANEOUS | Status: DC
  Administered 2015-11-10 (×2): 2 [IU] via SUBCUTANEOUS
  Administered 2015-11-11: 1 [IU] via SUBCUTANEOUS
  Administered 2015-11-11: 2 [IU] via SUBCUTANEOUS
  Administered 2015-11-12: 1 [IU] via SUBCUTANEOUS
  Administered 2015-11-12: 2 [IU] via SUBCUTANEOUS
  Administered 2015-11-12: 1 [IU] via SUBCUTANEOUS

## 2015-11-09 NOTE — ED Notes (Signed)
Patient states that his head feels funny. BP had decreased 175/90. MD notified.

## 2015-11-09 NOTE — ED Provider Notes (Signed)
CSN: [id number]     Arrival date & time 11/09/15  1557 History   First MD Initiated Contact with Patient 11/09/15 1948     Chief Complaint  Patient presents with  . Hypertension  . Chest Pain     (Consider location/radiation/quality/duration/timing/severity/associated sxs/prior Treatment) HPI Comments: 60 year old male, history of ischemic cardiomyopathy, congestive heart failure, prior myocardial infarction with 2 stents who also has hypertension. He states that over the last couple of days his hypertension has been uncontrolled reaching numbers as high as 220/120 at home. He reports that his blood pressure went down after taking his medications but today it is gradually rising and at this time it is up over 220 again. He states that he has had some left-sided parasternal chest pain as well as some shortness of breath and a headache with some blurred vision. He reports that he has never had these symptoms with high blood pressure before. He endorses having chronic swelling of the lower extremities. This blood pressure is severely elevated, he continues to rise, nothing seems to make it better  Patient is a 61 y.o. male presenting with hypertension and chest pain. The history is provided by the patient.  Hypertension Associated symptoms include chest pain.  Chest Pain   Past Medical History  Diagnosis Date  . Arteriosclerotic cardiovascular disease (ASCVD)     a. 05/2011 Cath/PCI: LM nl, LAD 54m, D1 small, D2 small 32m, LCX large 40p, RCA 50-60p, 99 hazy @ origin of PDA with 70-80 in PDA (2.5x26 Resolute Integrity & 3.0x15 Resolute Integrity DES).;  b. 08/2012 Inflat  STEMI/Cath/PCI: LM minor irregs, LAD 50p, D1 50, LCX nl, OM1 25, RCA 30-40p, 100d (treated with 2.75x44mm Promus Premier DES);  c. 08/2012 Echo: EF 55-60%, basal inferopost HK.  [name] Hyperlipidemia   . Diabetes mellitus     Peripheral neuropathy  . Bell palsy   . Hypertension   . COPD (chronic obstructive pulmonary disease) ([place])    . Sleep apnea   . Gallstones   . Cholelithiasis 07/2012    Asymptomatic; identified incidentally  . PONV (postoperative nausea and vomiting)   . Anxiety   . C. difficile colitis     a. 08/2012  . Myocardial infarct ([place]) 09/08/12  . Nephrolithiasis   . Contrast dye induced nephropathy     a. 08/2012 post cath/pci  . CHF (congestive heart failure) ([place])   . Asthma   . Heart disease   . Old myocardial infarction   . Anginal pain ([place])   . GERD (gastroesophageal reflux disease)    Past Surgical History  Procedure Laterality Date  . Circumcision    . Stents    . Esophagogastroduodenoscopy      in [place] over 20 yrs ago  . Colonoscopy      In [facility], approximately 2011 per patient, was normal. Advised to come back in 10 years.  . Esophagogastroduodenoscopy  06/12/2012    [id number] esophagus-status post Venia Minks dilation. Abnormal gastric mucosa of uncertain significance-status post biopsy  . Cardiac catheterization    . Subxyphoid pericardial window N/A 06/23/2013    Procedure: SUBXYPHOID PERICARDIAL WINDOW;  Surgeon: Grace Isaac, MD;  Location: [facility];  Service: Thoracic;  Laterality: N/A;  . Intraoperative transesophageal echocardiogram N/A 06/23/2013    Procedure: INTRAOPERATIVE TRANSESOPHAGEAL ECHOCARDIOGRAM;  Surgeon: Grace Isaac, MD;  Location: [facility];  Service: Open Heart Surgery;  Laterality: N/A;  . Left heart catheterization with coronary angiogram N/A 09/08/2012    Procedure: LEFT HEART  CATHETERIZATION WITH CORONARY ANGIOGRAM;  Surgeon: [name], MD;  Location: [facility] CATH LAB;  Service: Cardiovascular;  Laterality: N/A;  . Chest tube insertion     Family History  Problem Relation Age of Onset  . Diabetes Mother   . Heart attack Mother   . Stroke Mother   . Diabetes Sister   . Sleep apnea Sister   . Hypertension Brother   . Diabetes Brother   . Colon cancer Neg Hx   . Liver disease Neg Hx   . Diabetes Brother   . Hypertension  Brother    Social History  Substance Use Topics  . Smoking status: Never Smoker   . Smokeless tobacco: Never Used  . Alcohol Use: No     Comment: heavy etoh use 30 years ago    Review of Systems  Cardiovascular: Positive for chest pain.  All other systems reviewed and are negative.     Allergies  Hydrocodone; Lisinopril; Neurontin; Statins; Metformin and related; and Norvasc  Home Medications   Prior to Admission medications   Medication Sig Start Date End Date Taking? Authorizing Provider  acetaminophen (TYLENOL) 325 MG tablet Take 650 mg by mouth every 6 (six) hours as needed for mild pain.   Yes Historical Provider, MD  albuterol (PROVENTIL HFA;VENTOLIN HFA) 108 (90 BASE) MCG/ACT inhaler Inhale 2 puffs into the lungs every 4 (four) hours as needed for shortness of breath. 09/28/14  Yes [name], MD  ALPRAZolam Duanne Moron) 0.5 MG tablet TAKE 1/2-1 TABLET BY MOUTH AT BEDTIME AS NEEDED 07/29/15  Yes [name], MD  aspirin EC 81 MG tablet Take 81 mg by mouth at bedtime.    Yes Historical Provider, MD  cloNIDine (CATAPRES) 0.1 MG tablet Take 1 tablet (0.1 mg total) by mouth 2 (two) times daily. 09/28/15  Yes Herminio Commons, MD  clopidogrel (PLAVIX) 75 MG tablet Take 1 tablet (75 mg total) by mouth at bedtime. 09/23/15  Yes [name], MD  colchicine (COLCRYS) 0.6 MG tablet 2 now then one bid until relief Patient taking differently: 0.6 mg daily as needed (gout). 2 now then one bid until relief 07/04/15  Yes [name], MD  esomeprazole (NEXIUM) 20 MG capsule Take 20 mg by mouth daily as needed (for acid reflux).   Yes Historical Provider, MD  hydrALAZINE (APRESOLINE) 50 MG tablet Take 1 tablet (50 mg total) by mouth 2 (two) times daily. 06/10/15  Yes Herminio Commons, MD  LANTUS SOLOSTAR 100 UNIT/ML Solostar Pen Inject 80 Units as directed at bedtime. 10/26/15  Yes Historical Provider, MD  losartan (COZAAR) 100 MG tablet Take 1 tablet (100 mg total) by mouth daily.  09/23/15  Yes [name], MD  metoprolol tartrate (LOPRESSOR) 25 MG tablet Take 1 tablet (25 mg total) by mouth 2 (two) times daily. 04/06/15  Yes [name], MD  torsemide (DEMADEX) 20 MG tablet Take 2 tablets (40 mg total) by mouth daily. 06/10/15  Yes Herminio Commons, MD  traMADol (ULTRAM) 50 MG tablet TAKE 1 TABLET BY MOUTH EVERY 6 HOURS AS NEEDED FOR PAIN. 10/21/15  Yes [name], MD  fenofibrate 160 MG tablet Take 1 tablet (160 mg total) by mouth at bedtime. Patient not taking: Reported on 11/09/2015 12/08/14   [name], MD  oxyCODONE-acetaminophen (ROXICET) 5-325 MG tablet Take 1-2 tablets by mouth every 8 (eight) hours as needed for severe pain. Patient not taking: Reported on 11/09/2015 09/28/15   [name],  MD   BP 175/89 mmHg  Pulse 95  Temp(Src) 98.5 F (36.9 C) (Oral)  Resp 18  Ht 6\' 1"  (1.854 m)  Wt 272 lb (123.378 kg)  BMI 35.89 kg/m2  SpO2 92% Physical Exam  Constitutional: He appears well-developed and well-nourished. No distress.  HENT:  Head: Normocephalic and atraumatic.  Mouth/Throat: Oropharynx is clear and moist. No oropharyngeal exudate.  Eyes: Conjunctivae and EOM are normal. Pupils are equal, round, and reactive to light. Right eye exhibits no discharge. Left eye exhibits no discharge. No scleral icterus.  Neck: Normal range of motion. Neck supple. No JVD present. No thyromegaly present.  Cardiovascular: Normal rate, regular rhythm, normal heart sounds and intact distal pulses.  Exam reveals no gallop and no friction rub.   No murmur heard. Pulmonary/Chest: Effort normal and breath sounds normal. No respiratory distress. He has no wheezes. He has no rales.  Abdominal: Soft. Bowel sounds are normal. He exhibits no distension and no mass. There is no tenderness.  Musculoskeletal: Normal range of motion. He exhibits edema ( mild bilateral pitting edema). He exhibits no tenderness.  Lymphadenopathy:    He has no cervical adenopathy.   Neurological: He is alert. Coordination normal.  Skin: Skin is warm and dry. No rash noted. No erythema.  Psychiatric: He has a normal mood and affect. His behavior is normal.  Nursing note and vitals reviewed.   ED Course  Procedures (including critical care time) Labs Review Labs Reviewed  CBC WITH DIFFERENTIAL/PLATELET - Abnormal; Notable for the following:    Hemoglobin 12.5 (*)    HCT 37.3 (*)    All other components within normal limits  BASIC METABOLIC PANEL - Abnormal; Notable for the following:    Sodium 134 (*)    Chloride 98 (*)    Glucose, Bld 225 (*)    BUN 32 (*)    Creatinine, Ser 1.26 (*)    Calcium 8.8 (*)    All other components within normal limits  TROPONIN I - Abnormal; Notable for the following:    Troponin I 0.04 (*)    All other components within normal limits  BRAIN NATRIURETIC PEPTIDE    Imaging Review Dg Chest 2 View  11/09/2015  CLINICAL DATA:  Chest pain EXAM: CHEST  2 VIEW COMPARISON:  CT chest 05/26/2015.  Chest x-ray 05/26/2015. FINDINGS: The lungs are clear wiithout focal pneumonia, edema, pneumothorax or pleural effusion. Interstitial markings are diffusely coarsened with chronic features. The cardiopericardial silhouette is within normal limits for size. IMPRESSION: No active cardiopulmonary disease. Electronically Signed   By: Misty Stanley M.D.   On: 11/09/2015 16:37   Ct Head Wo Contrast  11/09/2015  CLINICAL DATA:  Acute onset of blurred vision and high blood pressure. Shortness of breath and generalized chest pain. Initial encounter. EXAM: CT HEAD WITHOUT CONTRAST TECHNIQUE: Contiguous axial images were obtained from the base of the skull through the vertex without intravenous contrast. COMPARISON:  None. FINDINGS: There is no evidence of acute infarction, mass lesion, or intra- or extra-axial hemorrhage on CT. The posterior fossa, including the cerebellum, brainstem and fourth ventricle, is within normal limits. The third and lateral  ventricles, and basal ganglia are unremarkable in appearance. The cerebral hemispheres are symmetric in appearance, with normal gray-white differentiation. No mass effect or midline shift is seen. There is no evidence of fracture; visualized osseous structures are unremarkable in appearance. The visualized portions of the orbits are within normal limits. The paranasal sinuses and mastoid air cells are well-aerated.  Scattered chronic soft tissue inflammation is noted about both sides of the head. IMPRESSION: Unremarkable noncontrast CT of the head. Electronically Signed   By: Garald Balding M.D.   On: 11/09/2015 21:13   I have personally reviewed and evaluated these images and lab results as part of my medical decision-making.   EKG Interpretation   Date/Time:  Wednesday November 09 2015 16:16:47 EDT Ventricular Rate:  83 PR Interval:  190 QRS Duration: 98 QT Interval:  372 QTC Calculation: 437 R Axis:   27 Text Interpretation:  Normal sinus rhythm Incomplete right bundle branch  block Inferior infarct , age undetermined Anterior infarct , age  undetermined T wave abnormality, consider lateral ischemia Abnormal ECG  since last tracing no significant change Confirmed by Sabra Heck  MD, Emori Mumme  [phone]) on 11/09/2015 4:20:36 PM      MDM   Final diagnoses:  Severe hypertension  Chest pain, unspecified chest pain type    The patient's lung sounds are clear, he is oxygenating well, his blood pressure is severely elevated and I suspect that is a good part of the reason that the patient is feeling uncomfortable. He will be started on a nitroglycerin drip, hydralazine, he will need to be likely admitted to an inpatient status for more aggressive blood pressure control and cardiac rule out. Initial troponin was 0.04. This is not diagnostic of myocardial infarction however I do suspect that given his chronic ischemia this could come into play. He is on Plavix  The patient continues to have symptoms with  mild left-sided chest pain, his blood pressure improved slightly with hydralazine but not enough thus nitroglycerin drip was started. The patient has severe hypertension requiring a titratable drip. Critical care is being provided, the patient will go to the stepdown unit under the care of Dr. Arnoldo Morale and the hospitalist service.  CRITICAL CARE Performed by: [name] Total critical care time: 35 minutes Critical care time was exclusive of separately billable procedures and treating other patients. Critical care was necessary to treat or prevent imminent or life-threatening deterioration. Critical care was time spent personally by me on the following activities: development of treatment plan with patient and/or surrogate as well as nursing, discussions with consultants, evaluation of patient's response to treatment, examination of patient, obtaining history from patient or surrogate, ordering and performing treatments and interventions, ordering and review of laboratory studies, ordering and review of radiographic studies, pulse oximetry and re-evaluation of patient's condition.  Meds given in ED:  Medications  nitroGLYCERIN 50 mg in dextrose 5 % 250 mL (0.2 mg/mL) infusion (20 mcg/min Intravenous New Bag/Given 11/09/15 2027)  hydrALAZINE (APRESOLINE) injection 10 mg (10 mg Intravenous Given 11/09/15 2025)      Noemi Chapel, MD 11/09/15 2140

## 2015-11-09 NOTE — H&P (Addendum)
[facility] Admission History and Physical       Eugene [name] [id number] DOB: 1956/01/05 DOA: 11/09/2015  Referring physician: EDP PCP: Eugene Lange, MD  Specialists:   Chief Complaint: Headache, and Chest Pain  HPI: Eugene [name] is a 60 y.o. male with a history of CAD S/P PCI with DES stents, Diastolic CHF, HTN, DM2, Stage III CKD who presents to the ED with complaints of severe 10/10 headaches for the past 2 days and intermittent substernal chest pain.   He reports that his blood pressure have been high despite taking his medications.    He was found to have a blood pressure of 220/120 in the ED and was initially administered IV Hydralazine with no effect. A ct Scan of the Head was performed and was negative for acute findings.   He was then placed on an IV Nitroglycerin drip and his blood pressure began to improve.       Review of Systems:    Constitutional: No Weight Loss, No Weight Gain, Night Sweats, Fevers, Chills, Dizziness, Light Headedness, Fatigue, or Generalized Weakness HEENT:  +Headaches, Difficulty Swallowing,Tooth/Dental Problems,Sore Throat,  No Sneezing, Rhinitis, Ear Ache, Nasal Congestion, or Post Nasal Drip,  Cardio-vascular:  +Chest pain, Orthopnea, PND, Edema in Lower Extremities, Anasarca, Dizziness, Palpitations  Resp: No +Dyspnea, No DOE, No Productive Cough, No Non-Productive Cough, No Hemoptysis, No Wheezing.    GI: No Heartburn, Indigestion, Abdominal Pain, Nausea, Vomiting, Diarrhea, Constipation, Hematemesis, Hematochezia, Melena, Change in Bowel Habits,  Loss of Appetite  GU: No Dysuria, No Change in Color of Urine, No Urgency or Urinary Frequency, No Flank pain.  Musculoskeletal: No Joint Pain or Swelling, No Decreased Range of Motion, No Back Pain.  Neurologic: No Syncope, No Seizures, Muscle Weakness, Paresthesia, Vision Disturbance or Loss, No Diplopia, No Vertigo, No Difficulty Walking,  Skin: No Rash or Lesions. Psych: No  Change in Mood or Affect, No Depression or Anxiety, No Memory loss, No Confusion, or Hallucinations   Past Medical History  Diagnosis Date  . Arteriosclerotic cardiovascular disease (ASCVD)     a. 05/2011 Cath/PCI: LM nl, LAD 69m, D1 small, D2 small 8m, LCX large 40p, RCA 50-60p, 99 hazy @ origin of PDA with 70-80 in PDA (2.5x26 Resolute Integrity & 3.0x15 Resolute Integrity DES).;  b. 08/2012 Inflat  STEMI/Cath/PCI: LM minor irregs, LAD 50p, D1 50, LCX nl, OM1 25, RCA 30-40p, 100d (treated with 2.75x28mm Promus Premier DES);  c. 08/2012 Echo: EF 55-60%, basal inferopost HK.  [name] Hyperlipidemia   . Diabetes mellitus     Peripheral neuropathy  . Bell palsy   . Hypertension   . COPD (chronic obstructive pulmonary disease) ([place])   . Sleep apnea   . Gallstones   . Cholelithiasis 07/2012    Asymptomatic; identified incidentally  . PONV (postoperative nausea and vomiting)   . Anxiety   . C. difficile colitis     a. 08/2012  . Myocardial infarct ([place]) 09/08/12  . Nephrolithiasis   . Contrast dye induced nephropathy     a. 08/2012 post cath/pci  . CHF (congestive heart failure) ([place])   . Asthma   . Heart disease   . Old myocardial infarction   . Anginal pain ([place])   . GERD (gastroesophageal reflux disease)      Past Surgical History  Procedure Laterality Date  . Circumcision    . Stents    . Esophagogastroduodenoscopy      in [place] over 20 yrs ago  . Colonoscopy  In [place], approximately 2011 per patient, was normal. Advised to come back in 10 years.  . Esophagogastroduodenoscopy  06/12/2012    [id number] esophagus-status post Venia Minks dilation. Abnormal gastric mucosa of uncertain significance-status post biopsy  . Cardiac catheterization    . Subxyphoid pericardial window N/A 06/23/2013    Procedure: SUBXYPHOID PERICARDIAL WINDOW;  Surgeon: Grace Isaac, MD;  Location: [place];  Service: Thoracic;  Laterality: N/A;  . Intraoperative transesophageal  echocardiogram N/A 06/23/2013    Procedure: INTRAOPERATIVE TRANSESOPHAGEAL ECHOCARDIOGRAM;  Surgeon: Grace Isaac, MD;  Location: [facility] [place];  Service: Open Heart Surgery;  Laterality: N/A;  . Left heart catheterization with coronary angiogram N/A 09/08/2012    Procedure: LEFT HEART CATHETERIZATION WITH CORONARY ANGIOGRAM;  Surgeon: [name], MD;  Location: [facility] CATH LAB;  Service: Cardiovascular;  Laterality: N/A;  . Chest tube insertion        Prior to Admission medications   Medication Sig Start Date End Date Taking? Authorizing Provider  acetaminophen (TYLENOL) 325 MG tablet Take 650 mg by mouth every 6 (six) hours as needed for mild pain.   Yes Historical Provider, MD  albuterol (PROVENTIL HFA;VENTOLIN HFA) 108 (90 BASE) MCG/ACT inhaler Inhale 2 puffs into the lungs every 4 (four) hours as needed for shortness of breath. 09/28/14  Yes [name], MD  ALPRAZolam Duanne Moron) 0.5 MG tablet TAKE 1/2-1 TABLET BY MOUTH AT BEDTIME AS NEEDED 07/29/15  Yes [name], MD  aspirin EC 81 MG tablet Take 81 mg by mouth at bedtime.    Yes Historical Provider, MD  cloNIDine (CATAPRES) 0.1 MG tablet Take 1 tablet (0.1 mg total) by mouth 2 (two) times daily. 09/28/15  Yes Herminio Commons, MD  clopidogrel (PLAVIX) 75 MG tablet Take 1 tablet (75 mg total) by mouth at bedtime. 09/23/15  Yes [name], MD  colchicine (COLCRYS) 0.6 MG tablet 2 now then one bid until relief Patient taking differently: 0.6 mg daily as needed (gout). 2 now then one bid until relief 07/04/15  Yes [name], MD  esomeprazole (NEXIUM) 20 MG capsule Take 20 mg by mouth daily as needed (for acid reflux).   Yes Historical Provider, MD  hydrALAZINE (APRESOLINE) 50 MG tablet Take 1 tablet (50 mg total) by mouth 2 (two) times daily. 06/10/15  Yes Herminio Commons, MD  LANTUS SOLOSTAR 100 UNIT/ML Solostar Pen Inject 80 Units as directed at bedtime. 10/26/15  Yes Historical Provider, MD  losartan (COZAAR) 100 MG tablet Take 1  tablet (100 mg total) by mouth daily. 09/23/15  Yes [name], MD  metoprolol tartrate (LOPRESSOR) 25 MG tablet Take 1 tablet (25 mg total) by mouth 2 (two) times daily. 04/06/15  Yes [name], MD  torsemide (DEMADEX) 20 MG tablet Take 2 tablets (40 mg total) by mouth daily. 06/10/15  Yes Herminio Commons, MD  traMADol (ULTRAM) 50 MG tablet TAKE 1 TABLET BY MOUTH EVERY 6 HOURS AS NEEDED FOR PAIN. 10/21/15  Yes [name], MD  fenofibrate 160 MG tablet Take 1 tablet (160 mg total) by mouth at bedtime. Patient not taking: Reported on 11/09/2015 12/08/14   [name], MD  oxyCODONE-acetaminophen (ROXICET) 5-325 MG tablet Take 1-2 tablets by mouth every 8 (eight) hours as needed for severe pain. Patient not taking: Reported on 11/09/2015 09/28/15   [name], MD     Allergies  Allergen Reactions  . Hydrocodone Nausea And Vomiting  . Lisinopril Cough  . Neurontin [  Gabapentin] Other (See Comments)    Suicidal thoughts  . Statins Other (See Comments)    Muscle aches  . Metformin And Related Diarrhea and Other (See Comments)    Intestinal side effects  . Norvasc [Amlodipine Besylate] Swelling    Pedal edema    Social History:  reports that he has never smoked. He has never used smokeless tobacco. He reports that he does not drink alcohol or use illicit drugs.    Family History  Problem Relation Age of Onset  . Diabetes Mother   . Heart attack Mother   . Stroke Mother   . Diabetes Sister   . Sleep apnea Sister   . Hypertension Brother   . Diabetes Brother   . Colon cancer Neg Hx   . Liver disease Neg Hx   . Diabetes Brother   . Hypertension Brother        Physical Exam:  GEN:   Pleasant  Obese  60 y.o.  Caucasian male examined and in no acute distress; cooperative with exam Filed Vitals:   11/09/15 2110 11/09/15 2125 11/09/15 2130 11/09/15 2135  BP: 185/84 170/89 175/89 161/87  Pulse: 91 95 95 92  Temp:      TempSrc:      Resp: 19 16 18 18   Height:        Weight:      SpO2: 96% 93% 92% 93%   Blood pressure 161/87, pulse 92, temperature 98.5 F (36.9 C), temperature source Oral, resp. rate 18, height 6\' 1"  (1.854 m), weight 123.378 kg (272 lb), SpO2 93 %. PSYCH: He is alert and oriented x4; does not appear anxious does not appear depressed; affect is normal HEENT: Normocephalic and Atraumatic, Mucous membranes pink; PERRLA; EOM intact; Fundi:  Benign;  No scleral icterus, Nares: Patent, Oropharynx: Clear, Fair Dentition,    Neck:  FROM, No Cervical Lymphadenopathy nor Thyromegaly or Carotid Bruit; No JVD; Breasts:: Not examined CHEST WALL: No tenderness CHEST: Normal respiration, clear to auscultation bilaterally HEART: Regular rate and rhythm; no murmurs rubs or gallops BACK: No kyphosis or scoliosis; No CVA tenderness ABDOMEN: Positive Bowel Sounds, Obese, Soft Non-Tender, No Rebound or Guarding; No Masses, No Organomegaly, No Pannus; No Intertriginous candida. Rectal Exam: Not done EXTREMITIES: No Cyanosis, Clubbing, 2+BLE Edema; No Ulcerations. Genitalia: not examined PULSES: 2+ and symmetric SKIN: Normal hydration no rash or ulceration CNS:  Alert and Oriented x 4, No Focal Deficits Vascular: pulses palpable throughout    Labs on Admission:  Basic Metabolic Panel:  Recent Labs Lab 11/09/15 1636  NA 134*  K 4.5  CL 98*  CO2 29  GLUCOSE 225*  BUN 32*  CREATININE 1.26*  CALCIUM 8.8*   Liver Function Tests: No results for input(s): AST, ALT, ALKPHOS, BILITOT, PROT, ALBUMIN in the last 168 hours. No results for input(s): LIPASE, AMYLASE in the last 168 hours. No results for input(s): AMMONIA in the last 168 hours. CBC:  Recent Labs Lab 11/09/15 1636  WBC 6.8  NEUTROABS 4.4  HGB 12.5*  HCT 37.3*  MCV 87.4  PLT 187   Cardiac Enzymes:  Recent Labs Lab 11/09/15 1636  TROPONINI 0.04*    BNP (last 3 results)  Recent Labs  12/25/14 1305 05/26/15 1125 11/09/15 1636  BNP 72.0 120.0* 69.0    ProBNP (last  3 results) No results for input(s): PROBNP in the last 8760 hours.  CBG: No results for input(s): GLUCAP in the last 168 hours.  Radiological Exams on Admission: Dg Chest 2  View  11/09/2015  CLINICAL DATA:  Chest pain EXAM: CHEST  2 VIEW COMPARISON:  CT chest 05/26/2015.  Chest x-ray 05/26/2015. FINDINGS: The lungs are clear wiithout focal pneumonia, edema, pneumothorax or pleural effusion. Interstitial markings are diffusely coarsened with chronic features. The cardiopericardial silhouette is within normal limits for size. IMPRESSION: No active cardiopulmonary disease. Electronically Signed   By: Misty Stanley M.D.   On: 11/09/2015 16:37   Ct Head Wo Contrast  11/09/2015  CLINICAL DATA:  Acute onset of blurred vision and high blood pressure. Shortness of breath and generalized chest pain. Initial encounter. EXAM: CT HEAD WITHOUT CONTRAST TECHNIQUE: Contiguous axial images were obtained from the base of the skull through the vertex without intravenous contrast. COMPARISON:  None. FINDINGS: There is no evidence of acute infarction, mass lesion, or intra- or extra-axial hemorrhage on CT. The posterior fossa, including the cerebellum, brainstem and fourth ventricle, is within normal limits. The third and lateral ventricles, and basal ganglia are unremarkable in appearance. The cerebral hemispheres are symmetric in appearance, with normal gray-white differentiation. No mass effect or midline shift is seen. There is no evidence of fracture; visualized osseous structures are unremarkable in appearance. The visualized portions of the orbits are within normal limits. The paranasal sinuses and mastoid air cells are well-aerated. Scattered chronic soft tissue inflammation is noted about both sides of the head. IMPRESSION: Unremarkable noncontrast CT of the head. Electronically Signed   By: Garald Balding M.D.   On: 11/09/2015 21:13     EKG: Independently reviewed. Normal Sinus Rhythm Rate = 83,  +Incomplete  RBBB, Evidence of Previous Anterior and Inferior Infarcts        Assessment/Plan:      60 y.o. male with  Principal Problem:    Hypertensive urgency    IV NTG drip    Continue Home BP Meds     Hydralazine, Clonidine, Metoprolol, Losartan Rx    Stepdown Monitoring   Active Problems:    Chest pain    Cardiac Monitoring    Cycle Troponins    Check Lipids     CAD (coronary artery disease)    Continue Plavix, ASA, Metoprolol, Losartan Rx        Diastolic CHF (HCC)    Monitor I/Os    Continue Daily Torsemide Rx, and Metoprolol  and Losartan Rx       Type 2 diabetes mellitus with neurological manifestations (HCC)    Continue Lantus Insulin    SSI coverage PRN    Check HbA1C in AM        Chronic renal insufficiency, stage III (moderate)    Monitor BUN/Cr      DVT Prophylaxis     Lovenox    Code Status:     FULL CODE       Family Communication:   No Family Present    Disposition Plan:    Inpatient Status        Time spent: 64 Minutes      Theressa Millard [facility] Pager [phone]   If 7AM -7PM Please Contact the Day Rounding Team MD for [facility]  If 7PM-7AM, Please Contact Night-Floor Coverage  [email or web address] Password [email or web address] 11/09/2015, 10:10 PM     ADDENDUM:   Patient was seen and examined on 11/09/2015

## 2015-11-09 NOTE — ED Notes (Signed)
Pt reports chest pain, blurred vision, hypertension, and some sob since last night.

## 2015-11-10 ENCOUNTER — Inpatient Hospital Stay (HOSPITAL_COMMUNITY): Payer: Commercial Managed Care - HMO

## 2015-11-10 ENCOUNTER — Other Ambulatory Visit: Payer: Self-pay | Admitting: *Deleted

## 2015-11-10 ENCOUNTER — Encounter: Payer: Self-pay | Admitting: *Deleted

## 2015-11-10 DIAGNOSIS — R079 Chest pain, unspecified: Secondary | ICD-10-CM

## 2015-11-10 DIAGNOSIS — E785 Hyperlipidemia, unspecified: Secondary | ICD-10-CM

## 2015-11-10 DIAGNOSIS — R0989 Other specified symptoms and signs involving the circulatory and respiratory systems: Secondary | ICD-10-CM | POA: Diagnosis present

## 2015-11-10 LAB — BASIC METABOLIC PANEL
ANION GAP: 9 (ref 5–15)
BUN: 30 mg/dL — ABNORMAL HIGH (ref 6–20)
CALCIUM: 8.7 mg/dL — AB (ref 8.9–10.3)
CHLORIDE: 101 mmol/L (ref 101–111)
CO2: 28 mmol/L (ref 22–32)
Creatinine, Ser: 1.14 mg/dL (ref 0.61–1.24)
GFR calc non Af Amer: >60 mL/min (ref >60)
GLUCOSE: 148 mg/dL — AB (ref 65–99)
POTASSIUM: 3.9 mmol/L (ref 3.5–5.1)
Sodium: 138 mmol/L (ref 135–145)

## 2015-11-10 LAB — GLUCOSE, CAPILLARY
GLUCOSE-CAPILLARY: 111 mg/dL — AB (ref 65–99)
GLUCOSE-CAPILLARY: 132 mg/dL — AB (ref 65–99)
GLUCOSE-CAPILLARY: 185 mg/dL — AB (ref 65–99)
Glucose-Capillary: 143 mg/dL — ABNORMAL HIGH (ref 65–99)
Glucose-Capillary: 202 mg/dL — ABNORMAL HIGH (ref 65–99)
Glucose-Capillary: 178 mg/dL — ABNORMAL HIGH (ref 65–99)

## 2015-11-10 LAB — ECHOCARDIOGRAM COMPLETE
HEIGHTINCHES: 73 in
WEIGHTICAEL: 4179.92 [oz_av]

## 2015-11-10 LAB — LIPID PANEL
CHOLESTEROL: 274 mg/dL — AB (ref 0–200)
HDL: 28 mg/dL — ABNORMAL LOW (ref >40)
LDL Cholesterol: UNDETERMINED mg/dL (ref 0–99)
Total CHOL/HDL Ratio: 9.8 RATIO
Triglycerides: 550 mg/dL — ABNORMAL HIGH (ref <150)
VLDL: UNDETERMINED mg/dL (ref 0–40)

## 2015-11-10 LAB — CBC
HEMATOCRIT: 34.9 % — AB (ref 39.0–52.0)
HEMOGLOBIN: 11.6 g/dL — AB (ref 13.0–17.0)
MCH: 29.1 pg (ref 26.0–34.0)
MCHC: 33.2 g/dL (ref 30.0–36.0)
MCV: 87.5 fL (ref 78.0–100.0)
Platelets: 191 10*3/uL (ref 150–400)
RBC: 3.99 MIL/uL — AB (ref 4.22–5.81)
RDW: 13.7 % (ref 11.5–15.5)
WBC: 8.2 10*3/uL (ref 4.0–10.5)

## 2015-11-10 LAB — TROPONIN I
TROPONIN I: 0.05 ng/mL — AB (ref <0.031)
Troponin I: 0.04 ng/mL — ABNORMAL HIGH (ref <0.031)
Troponin I: 0.04 ng/mL — ABNORMAL HIGH (ref <0.031)

## 2015-11-10 LAB — MRSA PCR SCREENING: MRSA BY PCR: NEGATIVE

## 2015-11-10 MED ORDER — HYDROMORPHONE HCL 1 MG/ML IJ SOLN: 0.5000 mg | INTRAMUSCULAR | Status: DC | PRN

## 2015-11-10 MED ORDER — ACETAMINOPHEN 325 MG PO TABS: 650.0000 mg | ORAL_TABLET | Freq: Four times a day (QID) | ORAL | Status: DC | PRN

## 2015-11-10 MED ORDER — ONDANSETRON HCL 4 MG/2ML IJ SOLN: 4.0000 mg | Freq: Three times a day (TID) | INTRAMUSCULAR | Status: AC | PRN

## 2015-11-10 MED ORDER — ALPRAZOLAM 0.5 MG PO TABS: 1.0000 mg | ORAL_TABLET | Freq: Every evening | ORAL | Status: DC | PRN

## 2015-11-10 MED ORDER — CLONIDINE HCL 0.1 MG PO TABS
0.1000 mg | ORAL_TABLET | Freq: Two times a day (BID) | ORAL | Status: DC
  Administered 2015-11-10 – 2015-11-13 (×7): 0.1 mg via ORAL
  Filled 2015-11-10 (×7): qty 1

## 2015-11-10 MED ORDER — LOSARTAN POTASSIUM 50 MG PO TABS
100.0000 mg | ORAL_TABLET | Freq: Every day | ORAL | Status: DC
  Administered 2015-11-10 – 2015-11-13 (×4): 100 mg via ORAL
  Filled 2015-11-10 (×4): qty 2

## 2015-11-10 MED ORDER — PANTOPRAZOLE SODIUM 40 MG PO TBEC
40.0000 mg | DELAYED_RELEASE_TABLET | Freq: Every day | ORAL | Status: DC
  Administered 2015-11-10 – 2015-11-13 (×4): 40 mg via ORAL
  Filled 2015-11-10 (×4): qty 1

## 2015-11-10 MED ORDER — ONDANSETRON HCL 4 MG/2ML IJ SOLN: 4.0000 mg | Freq: Four times a day (QID) | INTRAMUSCULAR | Status: DC | PRN

## 2015-11-10 MED ORDER — METOPROLOL TARTRATE 25 MG PO TABS
25.0000 mg | ORAL_TABLET | Freq: Two times a day (BID) | ORAL | Status: DC
  Filled 2015-11-10: qty 1

## 2015-11-10 MED ORDER — CLOPIDOGREL BISULFATE 75 MG PO TABS
75.0000 mg | ORAL_TABLET | Freq: Every day | ORAL | Status: DC
  Administered 2015-11-10 – 2015-11-12 (×4): 75 mg via ORAL
  Filled 2015-11-10 (×4): qty 1

## 2015-11-10 MED ORDER — ASPIRIN EC 81 MG PO TBEC
81.0000 mg | DELAYED_RELEASE_TABLET | Freq: Every day | ORAL | Status: DC
  Administered 2015-11-10 – 2015-11-12 (×4): 81 mg via ORAL
  Filled 2015-11-10 (×4): qty 1

## 2015-11-10 MED ORDER — SODIUM CHLORIDE 0.9% FLUSH: 3.0000 mL | INTRAVENOUS | Status: DC | PRN

## 2015-11-10 MED ORDER — PRAVASTATIN SODIUM 10 MG PO TABS
20.0000 mg | ORAL_TABLET | Freq: Every day | ORAL | Status: DC
  Administered 2015-11-10 – 2015-11-12 (×3): 20 mg via ORAL
  Filled 2015-11-10 (×3): qty 2

## 2015-11-10 MED ORDER — METOPROLOL TARTRATE 50 MG PO TABS
50.0000 mg | ORAL_TABLET | Freq: Two times a day (BID) | ORAL | Status: DC
  Administered 2015-11-10 – 2015-11-13 (×5): 50 mg via ORAL
  Filled 2015-11-10 (×6): qty 1

## 2015-11-10 MED ORDER — ONDANSETRON HCL 4 MG PO TABS: 4.0000 mg | ORAL_TABLET | Freq: Four times a day (QID) | ORAL | Status: DC | PRN

## 2015-11-10 MED ORDER — HYDRALAZINE HCL 25 MG PO TABS
50.0000 mg | ORAL_TABLET | Freq: Two times a day (BID) | ORAL | Status: DC
  Administered 2015-11-10 – 2015-11-13 (×7): 50 mg via ORAL
  Filled 2015-11-10 (×7): qty 2

## 2015-11-10 MED ORDER — CLONIDINE HCL 0.1 MG PO TABS
0.1000 mg | ORAL_TABLET | Freq: Two times a day (BID) | ORAL | Status: DC
  Filled 2015-11-10: qty 1

## 2015-11-10 MED ORDER — FENOFIBRATE 160 MG PO TABS
160.0000 mg | ORAL_TABLET | Freq: Every day | ORAL | Status: DC
  Administered 2015-11-10 – 2015-11-13 (×4): 160 mg via ORAL
  Filled 2015-11-10 (×5): qty 1

## 2015-11-10 MED ORDER — ENOXAPARIN SODIUM 40 MG/0.4ML ~~LOC~~ SOLN
40.0000 mg | SUBCUTANEOUS | Status: DC
  Administered 2015-11-10 – 2015-11-12 (×4): 40 mg via SUBCUTANEOUS
  Filled 2015-11-10 (×4): qty 0.4

## 2015-11-10 MED ORDER — METOPROLOL TARTRATE 25 MG PO TABS
25.0000 mg | ORAL_TABLET | Freq: Once | ORAL | Status: AC
  Administered 2015-11-10: 25 mg via ORAL
  Filled 2015-11-10: qty 1

## 2015-11-10 MED ORDER — SODIUM CHLORIDE 0.9% FLUSH
3.0000 mL | Freq: Two times a day (BID) | INTRAVENOUS | Status: DC
  Administered 2015-11-10 (×3): 3 mL via INTRAVENOUS

## 2015-11-10 MED ORDER — INSULIN GLARGINE 100 UNIT/ML ~~LOC~~ SOLN
80.0000 [IU] | Freq: Every day | SUBCUTANEOUS | Status: DC
  Filled 2015-11-10: qty 0.8

## 2015-11-10 MED ORDER — HYDRALAZINE HCL 25 MG PO TABS
50.0000 mg | ORAL_TABLET | Freq: Two times a day (BID) | ORAL | Status: DC
  Filled 2015-11-10: qty 2

## 2015-11-10 MED ORDER — METOPROLOL TARTRATE 25 MG PO TABS
25.0000 mg | ORAL_TABLET | Freq: Two times a day (BID) | ORAL | Status: DC
  Administered 2015-11-10: 25 mg via ORAL
  Filled 2015-11-10: qty 1

## 2015-11-10 MED ORDER — TRAMADOL HCL 50 MG PO TABS: 50.0000 mg | ORAL_TABLET | Freq: Four times a day (QID) | ORAL | Status: DC | PRN

## 2015-11-10 MED ORDER — ACETAMINOPHEN 650 MG RE SUPP: 650.0000 mg | Freq: Four times a day (QID) | RECTAL | Status: DC | PRN

## 2015-11-10 MED ORDER — SODIUM CHLORIDE 0.9 % IV SOLN
250.0000 mL | INTRAVENOUS | Status: DC | PRN
  Administered 2015-11-10: 250 mL via INTRAVENOUS

## 2015-11-10 MED ORDER — ALUM & MAG HYDROXIDE-SIMETH 200-200-20 MG/5ML PO SUSP: 30.0000 mL | Freq: Four times a day (QID) | ORAL | Status: DC | PRN

## 2015-11-10 MED ORDER — TORSEMIDE 20 MG PO TABS
40.0000 mg | ORAL_TABLET | Freq: Every day | ORAL | Status: DC
  Administered 2015-11-10: 40 mg via ORAL
  Filled 2015-11-10: qty 2

## 2015-11-10 MED ORDER — INSULIN GLARGINE 100 UNIT/ML ~~LOC~~ SOLN
80.0000 [IU] | Freq: Every day | SUBCUTANEOUS | Status: DC
  Administered 2015-11-10 – 2015-11-11 (×3): 80 [IU] via SUBCUTANEOUS
  Filled 2015-11-10 (×5): qty 0.8

## 2015-11-10 NOTE — Progress Notes (Signed)
Medication related note:  PRAVACHOL  Pt has listed allergy to "statins" due to muscle aches.  D/W Dr Grandville Silos and plan to rechallenge with Pravachol due to cardiac issues and elevated cholesterol to evaluate tolerance.  Can stop if pt does not tolerate.  Monitor closely for adverse effects.    Hart Robinsons, PharmD Clinical Pharmacist 11/10/2015

## 2015-11-10 NOTE — Progress Notes (Signed)
[facility] PROGRESS NOTE  Eugene [name] [id number] DOB: 11/25/1955 DOA: 11/09/2015 PCP: [name], MD  Assessment/Plan: #1 hypertensive urgency/malignant hypertension Questionable etiology. Patient with prior history of difficult to control hypertension. Patient had presented with headaches and chest pain which have since improved. Patient currently on a nitroglycerin drip and home regimen of clonidine, hydralazine, Cozaar, metoprolol, Demadex. Will increase patient's metoprolol to 50 mg twice daily. Follow for now and titrate home meds as needed for better blood pressure control.  #2 chest pain Likely secondary to problem #1 however patient with prior cardiac history and multiple risk factors of diabetes, hypertension, chronic kidney disease stage III. Cardiac enzymes are minimally elevated have a central plateau. Check a 2-D echo. Continue Cozaar, hydralazine, metoprolol, Demadex. Consulted cardiology for further evaluation and management.  #3 type 2 diabetes Last hemoglobin A1c was 7.5 on 08/08/2015. Hemoglobin A1c pending. CBGs have ranged from 111- 143. Continue current dose of Lantus, sliding scale insulin. Follow.  #4 hyperlipidemia Patient with a total cholesterol of 274 with a triglycerides of 550. LDL unable to be calculated. Will start patient on TriCor. And will rechallenge patient with Pravachol. May consider adding coenzyme  Q10 to his regimen on discharge.  #5 chronic kidney disease stage III Stable.  #4 coronary artery disease/chronic diastolic CHF Currently stable. Patient looks compensated on examination. Fasting lipid panel with a total cholesterol 274 and a triglycerides of 550. LDL unable to be calculated. Continue Cozaar, hydralazine, metoprolol, Demadex. Will rechallenge with low dose Pravachol at 20 mg daily. TriCor.  #5 right carotid bruit Patient states he was supposed to get a carotid ultrasound today at PCPs office. Will order carotid ultrasound  in-house.  #6 gastroesophageal reflux disease  PPI.  #7 obstructive sleep apnea CPAP daily at bedtime.  #8 prophylaxis PPI for GI prophylaxis. Lovenox for DVT prophylaxis.  Code Status: Full Family Communication: Updated patient. No family present. Disposition Plan: Remain the stepdown unit for now until nitroglycerin drip resolve and blood pressure under better control.   Consultants:  Cardiology pending  Procedures:  Chest x-ray 11/09/2015  CT head 11/09/2015  Antibiotics:  None  HPI/Subjective: Patient denies any current chest pain states is improved. Patient states improvement with headache however still has a small headache likely secondary to nitroglycerin drip. No shortness of breath.  Objective: Filed Vitals:   11/10/15 0820 11/10/15 0900  BP:  155/67  Pulse:  95  Temp: 98.7 F (37.1 C)   Resp:  21    Intake/Output Summary (Last 24 hours) at 11/10/15 1118 Last data filed at 11/10/15 0900  Gross per 24 hour  Intake 506.47 ml  Output      0 ml  Net 506.47 ml   Filed Weights   11/09/15 1616 11/09/15 2320 11/10/15 0500  Weight: 123.378 kg (272 lb) 118.5 kg (261 lb 3.9 oz) 118.5 kg (261 lb 3.9 oz)    Exam:   General:   NAD  Cardiovascular: RRR. Right carotid bruit  Respiratory: CTAB  Abdomen: Soft, nontender, mildly distended, positive bowel sounds.  Musculoskeletal: No clubbing cyanosis. Trace pretibial M.: Edema left greater than right.  Data Reviewed: Basic Metabolic Panel:  Recent Labs Lab 11/09/15 1636 11/10/15 0521  NA 134* 138  K 4.5 3.9  CL 98* 101  CO2 29 28  GLUCOSE 225* 148*  BUN 32* 30*  CREATININE 1.26* 1.14  CALCIUM 8.8* 8.7*   Liver Function Tests: No results for input(s): AST, ALT, ALKPHOS, BILITOT, PROT, ALBUMIN in the last 168  hours. No results for input(s): LIPASE, AMYLASE in the last 168 hours. No results for input(s): AMMONIA in the last 168 hours. CBC:  Recent Labs Lab 11/09/15 1636 11/10/15 0521   WBC 6.8 8.2  NEUTROABS 4.4  --   HGB 12.5* 11.6*  HCT 37.3* 34.9*  MCV 87.4 87.5  PLT 187 191   Cardiac Enzymes:  Recent Labs Lab 11/09/15 1636 11/10/15 0044 11/10/15 0521  TROPONINI 0.04* 0.05* 0.04*   BNP (last 3 results)  Recent Labs  12/25/14 1305 05/26/15 1125 11/09/15 1636  BNP 72.0 120.0* 69.0    ProBNP (last 3 results) No results for input(s): PROBNP in the last 8760 hours.  CBG:  Recent Labs Lab 11/10/15 0008 11/10/15 0450 11/10/15 0804  GLUCAP 132* 143* 111*    Recent Results (from the past 240 hour(s))  MRSA PCR Screening     Status: None   Collection Time: 11/09/15 11:15 PM  Result Value Ref Range Status   MRSA by PCR NEGATIVE NEGATIVE Final    Comment:        The GeneXpert MRSA Assay (FDA approved for NASAL specimens only), is one component of a comprehensive MRSA colonization surveillance program. It is not intended to diagnose MRSA infection nor to guide or monitor treatment for MRSA infections.      Studies: Dg Chest 2 View  11/09/2015  CLINICAL DATA:  Chest pain EXAM: CHEST  2 VIEW COMPARISON:  CT chest 05/26/2015.  Chest x-ray 05/26/2015. FINDINGS: The lungs are clear wiithout focal pneumonia, edema, pneumothorax or pleural effusion. Interstitial markings are diffusely coarsened with chronic features. The cardiopericardial silhouette is within normal limits for size. IMPRESSION: No active cardiopulmonary disease. Electronically Signed   By: Misty Stanley M.D.   On: 11/09/2015 16:37   Ct Head Wo Contrast  11/09/2015  CLINICAL DATA:  Acute onset of blurred vision and high blood pressure. Shortness of breath and generalized chest pain. Initial encounter. EXAM: CT HEAD WITHOUT CONTRAST TECHNIQUE: Contiguous axial images were obtained from the base of the skull through the vertex without intravenous contrast. COMPARISON:  None. FINDINGS: There is no evidence of acute infarction, mass lesion, or intra- or extra-axial hemorrhage on CT. The  posterior fossa, including the cerebellum, brainstem and fourth ventricle, is within normal limits. The third and lateral ventricles, and basal ganglia are unremarkable in appearance. The cerebral hemispheres are symmetric in appearance, with normal gray-white differentiation. No mass effect or midline shift is seen. There is no evidence of fracture; visualized osseous structures are unremarkable in appearance. The visualized portions of the orbits are within normal limits. The paranasal sinuses and mastoid air cells are well-aerated. Scattered chronic soft tissue inflammation is noted about both sides of the head. IMPRESSION: Unremarkable noncontrast CT of the head. Electronically Signed   By: Garald Balding M.D.   On: 11/09/2015 21:13    Scheduled Meds: . aspirin EC  81 mg Oral QHS  . cloNIDine  0.1 mg Oral BID  . clopidogrel  75 mg Oral QHS  . enoxaparin (LOVENOX) injection  40 mg Subcutaneous Q24H  . fenofibrate  160 mg Oral Daily  . hydrALAZINE  50 mg Oral BID  . insulin aspart  0-5 Units Subcutaneous QHS  . insulin aspart  0-9 Units Subcutaneous TID WC  . insulin glargine  80 Units Subcutaneous Q2200  . losartan  100 mg Oral Daily  . metoprolol tartrate  25 mg Oral BID  . pantoprazole  40 mg Oral Daily  . pravastatin  20  mg Oral q1800  . sodium chloride flush  3 mL Intravenous Q12H  . torsemide  40 mg Oral Daily   Continuous Infusions: . nitroGLYCERIN 20 mcg/min (11/10/15 0600)    Principal Problem:   Hypertensive urgency Active Problems:   Essential hypertension   Obstructive sleep apnea- on C-pap   Gastroesophageal reflux disease   Anemia, normocytic normochromic   CAD (coronary artery disease)   Type 2 diabetes mellitus with neurological manifestations (HCC)   Chest pain   Chronic renal insufficiency, stage III (moderate)   HLD (hyperlipidemia)   Diastolic CHF ([place])   Right carotid bruit    Time spent: Craig Beach MD [facility] Pager  [phone]. If 7PM-7AM, please contact night-coverage at [email or web address], password [facility] 11/10/2015, 11:18 AM  LOS: 1 day

## 2015-11-10 NOTE — Care Management Note (Signed)
Case Management Note  Patient Details  Name: GLENWOOD PENDELTON MRN: [id number] Date of Birth: 1956/08/25  Subjective/Objective:                  Pt admitted with HTN urgency. Pt is from home, lives with his wife and is ind with ADL's. Pt has PCP, transportation, insurance, and no problems obtaining medications. No HH or DME needs at this time.  Pt plans to return home with self care at DC.   Action/Plan: No CM needs anticipated.   Expected Discharge Date:    11/12/2015              Expected Discharge Plan:  Home/Self Care  In-House Referral:  NA  Discharge planning Services  CM Consult  Post Acute Care Choice:  NA Choice offered to:  NA  DME Arranged:    DME Agency:     HH Arranged:    HH Agency:     Status of Service:  Completed, signed off  Medicare Important Message Given:    Date Medicare IM Given:    Medicare IM give by:    Date Additional Medicare IM Given:    Additional Medicare Important Message give by:     If discussed at [place] of Stay Meetings, dates discussed:    Additional Comments:  Sherald Barge, RN 11/10/2015, 1:37 PM

## 2015-11-10 NOTE — Evaluation (Signed)
Physical Therapy Evaluation Patient Details Name: Eugene [name] MRN: [id number] DOB: Jan 12, 1956 Today's Date: 11/10/2015   History of Present Illness  Eugene [name] is a 60 y.o. male with a history of CAD S/P PCI with DES stents, Diastolic CHF, HTN, DM2, Stage III CKD who presents to the ED with complaints of severe 10/10 headaches for the past 2 days and intermittent substernal chest pain. He reports that his blood pressure have been high despite taking his medications. He was found to have a blood pressure of 220/120 in the ED and was initially administered IV Hydralazine with no effect. A ct Scan of the Head was performed and was negative for acute findings. He was then placed on an IV Nitroglycerin drip and his blood pressure began to improve.   Clinical Impression  Pt states that he has been using a cane for ambulation recently.  Pt is at prior level of function and needs no skilled PT at this time.    Follow Up Recommendations No PT follow up    Equipment Recommendations       Recommendations for Other Services       Precautions / Restrictions Precautions Precautions: None Restrictions Weight Bearing Restrictions: No      Mobility  Bed Mobility Overal bed mobility: Independent                Transfers Overall transfer level: Independent                  Ambulation/Gait Ambulation/Gait assistance: Modified independent (Device/Increase time) Ambulation Distance (Feet): 125 Feet Assistive device:  (IV pole) Gait Pattern/deviations: WFL(Within Functional Limits)   Gait velocity interpretation: at or above normal speed for age/gender    Stairs            Wheelchair Mobility    Modified Rankin (Stroke Patients Only)             Pertinent Vitals/Pain Pain Assessment: No/denies pain    Home Living Family/patient expects to be discharged to:: Private residence Living Arrangements: Spouse/significant other Available  Help at Discharge: Family;Available PRN/intermittently Type of Home: House Home Access: Stairs to enter     Home Layout: One level Home Equipment: Kasandra Knudsen - single point      Prior Function Level of Independence: Independent with assistive device(s)                  Extremity/Trunk Assessment               Lower Extremity Assessment: Overall WFL for tasks assessed         Communication   Communication: No difficulties  Cognition Arousal/Alertness: Awake/alert   Overall Cognitive Status: Within Functional Limits for tasks assessed                               Assessment/Plan    PT Assessment Patent does not need any further PT services  PT Diagnosis     PT Problem List    PT Treatment Interventions     PT Goals (Current goals can be found in the Care Plan section)      Frequency     Barriers to discharge           End of Session Equipment Utilized During Treatment: Gait belt Activity Tolerance: Patient tolerated treatment well Patient left: in chair;with call bell/phone within reach Nurse Communication: Mobility status    Functional Limitation: Mobility: Walking  and moving around Mobility: Walking and Moving Around Current Status [phone]): At least 1 percent but less than 20 percent impaired, limited or restricted Mobility: Walking and Moving Around Goal Status [phone]): At least 1 percent but less than 20 percent impaired, limited or restricted Mobility: Walking and Moving Around Discharge Status [phone]): At least 1 percent but less than 20 percent impaired, limited or restricted    Time: 1021-1038 PT Time Calculation (min) (ACUTE ONLY): 17 min   Charges:   PT Evaluation $PT Eval Low Complexity: 1 Procedure     PT G Codes:   PT G-Codes **NOT FOR INPATIENT CLASS** Functional Limitation: Mobility: Walking and moving around Mobility: Walking and Moving Around Current Status [id number]): At least 1 percent but less than 20 percent  impaired, limited or restricted Mobility: Walking and Moving Around Goal Status [phone]): At least 1 percent but less than 20 percent impaired, limited or restricted Mobility: Walking and Moving Around Discharge Status [phone]): At least 1 percent but less than 20 percent impaired, limited or restricted    Rayetta Humphrey, PT CLT [phone] 11/10/2015, 10:41 AM

## 2015-11-10 NOTE — Consult Note (Signed)
   [facility] Inpatient Consult   11/10/2015  [place] 1956-03-14 [id number]   Spoke with patient at bedside regarding the restart of services with [place] [facility]. Patient verbalized  interest in [place] [facility] services. Patient signed consent form and packet with information given for disease management support or other services. Patient will receive post hospital follow up calls and be assessed for home visits.  Of note, [facility] Care Management services does not replace or interfere with any services that are arranged by inpatient case management or social work.  For questions, please contact:  Royetta Crochet. Laymond Purser, RN, BSN, [place] [phone]) Business Cell  [phone]) Toll Free Office

## 2015-11-11 ENCOUNTER — Other Ambulatory Visit: Payer: Self-pay | Admitting: *Deleted

## 2015-11-11 DIAGNOSIS — E1149 Type 2 diabetes mellitus with other diabetic neurological complication: Secondary | ICD-10-CM

## 2015-11-11 DIAGNOSIS — I160 Hypertensive urgency: Principal | ICD-10-CM

## 2015-11-11 DIAGNOSIS — G4733 Obstructive sleep apnea (adult) (pediatric): Secondary | ICD-10-CM

## 2015-11-11 DIAGNOSIS — N189 Chronic kidney disease, unspecified: Secondary | ICD-10-CM

## 2015-11-11 DIAGNOSIS — R072 Precordial pain: Secondary | ICD-10-CM

## 2015-11-11 DIAGNOSIS — I10 Essential (primary) hypertension: Secondary | ICD-10-CM

## 2015-11-11 DIAGNOSIS — D649 Anemia, unspecified: Secondary | ICD-10-CM

## 2015-11-11 LAB — BASIC METABOLIC PANEL
Anion gap: 8 (ref 5–15)
BUN: 41 mg/dL — AB (ref 6–20)
CALCIUM: 8.5 mg/dL — AB (ref 8.9–10.3)
CO2: 30 mmol/L (ref 22–32)
CREATININE: 2.15 mg/dL — AB (ref 0.61–1.24)
Chloride: 100 mmol/L — ABNORMAL LOW (ref 101–111)
GFR calc non Af Amer: 32 mL/min — ABNORMAL LOW (ref >60)
GFR, EST AFRICAN AMERICAN: 37 mL/min — AB (ref >60)
GLUCOSE: 113 mg/dL — AB (ref 65–99)
Potassium: 4.1 mmol/L (ref 3.5–5.1)
Sodium: 138 mmol/L (ref 135–145)

## 2015-11-11 LAB — CBC
HEMATOCRIT: 32.2 % — AB (ref 39.0–52.0)
Hemoglobin: 10.7 g/dL — ABNORMAL LOW (ref 13.0–17.0)
MCH: 29.6 pg (ref 26.0–34.0)
MCHC: 33.2 g/dL (ref 30.0–36.0)
MCV: 89 fL (ref 78.0–100.0)
Platelets: 167 10*3/uL (ref 150–400)
RBC: 3.62 MIL/uL — ABNORMAL LOW (ref 4.22–5.81)
RDW: 14 % (ref 11.5–15.5)
WBC: 6.8 10*3/uL (ref 4.0–10.5)

## 2015-11-11 LAB — GLUCOSE, CAPILLARY
GLUCOSE-CAPILLARY: 142 mg/dL — AB (ref 65–99)
GLUCOSE-CAPILLARY: 216 mg/dL — AB (ref 65–99)
Glucose-Capillary: 71 mg/dL (ref 65–99)
Glucose-Capillary: 164 mg/dL — ABNORMAL HIGH (ref 65–99)

## 2015-11-11 LAB — HEMOGLOBIN A1C
HEMOGLOBIN A1C: 9.1 % — AB (ref 4.8–5.6)
MEAN PLASMA GLUCOSE: 214 mg/dL

## 2015-11-11 NOTE — Progress Notes (Signed)
Attempted to provide a CPAP unit for patient and he refused a unit at this time. He stated he wore one at home but didn't want to use one here in hospital. I will continue to montior the patient for any changes.

## 2015-11-11 NOTE — Patient Outreach (Signed)
[place] [facility]) Care Management  11/11/2015  [place] 1955-10-05 [id number]   Mr. Olsson was referred to [place] [facility] during this hospitalization. I am familiar with Mr. Hurford from a few years ago when I worked with him in the community. I will follow his progress and reach out to him within 48 hours of hospital discharge for transition of care program and services.    [place] [facility]  [phone]

## 2015-11-11 NOTE — Care Management Important Message (Signed)
Important Message  Patient Details  Name: KYREN DANEHY MRN: [id number] Date of Birth: [birth year]   Medicare Important Message Given:  Yes    Sherald Barge, RN 11/11/2015, 2:15 PM

## 2015-11-11 NOTE — Progress Notes (Signed)
11/11/2015 Discussed Hgb A1c of 9.1 with patient.  Pt verbalizes understanding of how to improve blood sugar control.  Pt plans improve diet and continue exercise program.  Lucious Groves RD, CDE. M.Ed. Pager [phone] Inpatient Diabetes Coordinator

## 2015-11-11 NOTE — Care Management Note (Signed)
Case Management Note  Patient Details  Name: LOMAN ISMAILI MRN: [id number] Date of Birth: 08/14/1956  Expected Discharge Date:     11/13/2015             Expected Discharge Plan:  Home/Self Care  In-House Referral:  NA  Discharge planning Services  CM Consult  Post Acute Care Choice:  NA Choice offered to:  NA  DME Arranged:    DME Agency:     HH Arranged:    [place] Agency:     Status of Service:  Completed, signed off  Medicare Important Message Given:  Yes Date Medicare IM Given:    Medicare IM give by:    Date Additional Medicare IM Given:    Additional Medicare Important Message give by:     If discussed at [place] of Stay Meetings, dates discussed:    Additional Comments: Potential DC home over weekend. Pt has no CM needs at this time.  Sherald Barge, RN 11/11/2015, 2:15 PM

## 2015-11-11 NOTE — Progress Notes (Signed)
[facility] PROGRESS NOTE    Progress Note   Eugene [name] [id number] DOB: [birth year] DOA: 11/09/2015 PCP: [name], MD   Brief Narrative:   Eugene [name] is an 60 y.o. male past medical history of CAD status post PCI with a DES stent, chronic diastolic heart failure hypertension diabetes mellitus type 2 with chronic kidney disease to history presented to the ED with complaint of severe headache for the past 2 days.  Assessment/Plan:   Hypertensive urgency/  Essential hypertension: Blood pressure has improved significantly. Now off nitroglycerin drip. Currently symptom free. Can transfer to telemetry floor.  Chest pain/elevated troponins: Unlikely cardiac, he had barely elevated cardiac biomarkers, which have plateaued a 0.4 in the setting of chronic renal disease. 2-D echo was done that showed an EF of 55% with grade 2 diastolic heart failure normal motion abnormality,, EKG shows sinus rhythm with T-wave inversions in 1 and aVL, unchanged from previous.  Chronic renal insufficiency, stage III (moderate): It is hard to place a number on his creatinine, looking back to the chart and his history he fluctuates up and down,  he is definitely at least chronic kidney disease stage III. The reason his creatinine was artificially low on admission was probably due to sheer force due to elevated blood pressure.  Hyperlipidemia: Patient's was started on TriCor, he cannot tolerate statin as per cardiology notes. tolerating pravachol.  Type 2 diabetes mellitus with neurological manifestations ([place]) His last A1c was 9.1, which points towards noncompliant with his regimen. His blood glucose has been in excellent control when his regimen was started in the hospital.  Right carotid bruit: No ICA stenosis bilaterally. The material genus plaque but no hemodynamic changes.  Obstructive sleep apnea- on C-pap Cont C Pap.  Gastroesophageal reflux disease Cont  PPI.  Anemia, normocytic normochromic Hemoglobin stable.  CAD (coronary artery disease): Currently asymptomatic, continue Plavix and low-dose protocol.     DVT Prophylaxis - Lovenox ordered.  Family Communication: none Disposition Plan: Transfer to telemetry. Code Status:     Code Status Orders        Start     Ordered   11/10/15 0015  Full code   Continuous     11/10/15 0018    Code Status History    Date Active Date Inactive Code Status Order ID Comments User Context   05/26/2015  6:38 PM 05/27/2015  4:47 PM Full Code [id number]  Waldemar Dickens, MD ED   01/21/2015  5:43 AM 01/22/2015  9:09 PM Full Code [id number]  Orvan Falconer, MD Inpatient   12/25/2014  7:27 PM 12/27/2014  3:21 PM Full Code [id number]  Charlynne Cousins, MD Inpatient   07/03/2014  8:42 PM 07/05/2014  2:49 PM Full Code [id number]  Waldemar Dickens, MD Inpatient   07/03/2014  8:42 PM 07/03/2014  8:42 PM Full Code [id number]  Waldemar Dickens, MD Inpatient   01/26/2014  2:13 AM 01/27/2014  6:15 PM Full Code [id number]  Barton Dubois, MD Inpatient   10/15/2013 11:09 PM 10/17/2013  9:37 PM Full Code [id number]  Bonnielee Haff, MD Inpatient   06/23/2013  5:10 PM 06/26/2013  3:29 PM Full Code [id number]  [name], PA-C Inpatient   06/19/2013  3:24 AM 06/23/2013  5:10 PM Full Code [id number]  Stephani Police, MD Inpatient   05/09/2012  5:57 PM 05/11/2012 12:48 PM Full Code [id number]  Ena Dawley, RN Inpatient   05/25/2011  9:24 PM 05/30/2011  4:25 PM Full Code [id number]  Bella Kennedy, RN Inpatient        IV Access:    Peripheral IV   Procedures and diagnostic studies:   Dg Chest 2 View  11/09/2015  CLINICAL DATA:  Chest pain EXAM: CHEST  2 VIEW COMPARISON:  CT chest 05/26/2015.  Chest x-ray 05/26/2015. FINDINGS: The lungs are clear wiithout focal pneumonia, edema, pneumothorax or pleural effusion. Interstitial markings are diffusely coarsened with chronic features. The cardiopericardial silhouette is within normal  limits for size. IMPRESSION: No active cardiopulmonary disease. Electronically Signed   By: Misty Stanley M.D.   On: 11/09/2015 16:37   Ct Head Wo Contrast  11/09/2015  CLINICAL DATA:  Acute onset of blurred vision and high blood pressure. Shortness of breath and generalized chest pain. Initial encounter. EXAM: CT HEAD WITHOUT CONTRAST TECHNIQUE: Contiguous axial images were obtained from the base of the skull through the vertex without intravenous contrast. COMPARISON:  None. FINDINGS: There is no evidence of acute infarction, mass lesion, or intra- or extra-axial hemorrhage on CT. The posterior fossa, including the cerebellum, brainstem and fourth ventricle, is within normal limits. The third and lateral ventricles, and basal ganglia are unremarkable in appearance. The cerebral hemispheres are symmetric in appearance, with normal gray-white differentiation. No mass effect or midline shift is seen. There is no evidence of fracture; visualized osseous structures are unremarkable in appearance. The visualized portions of the orbits are within normal limits. The paranasal sinuses and mastoid air cells are well-aerated. Scattered chronic soft tissue inflammation is noted about both sides of the head. IMPRESSION: Unremarkable noncontrast CT of the head. Electronically Signed   By: Garald Balding M.D.   On: 11/09/2015 21:13   US Carotid Bilateral  11/10/2015  CLINICAL DATA:  60 year old male with a history of right carotid bruit. Cardiovascular risk factors include hypertension, known coronary artery disease, hyperlipidemia, diabetes. EXAM: BILATERAL CAROTID DUPLEX ULTRASOUND TECHNIQUE: Pearline Cables scale imaging, color Doppler and duplex ultrasound were performed of bilateral carotid and vertebral arteries in the neck. COMPARISON:  09/12/2004 FINDINGS: Criteria: Quantification of carotid stenosis is based on velocity parameters that correlate the residual internal carotid diameter with NASCET-based stenosis levels, using  the diameter of the distal internal carotid lumen as the denominator for stenosis measurement. The following velocity measurements were obtained: RIGHT ICA:  Systolic 0000000 cm/sec, Diastolic 35 cm/sec CCA:  79 cm/sec SYSTOLIC ICA/CCA RATIO:  1.4 ECA:  130 cm/sec LEFT ICA:  Systolic 92 cm/sec, Diastolic 25 cm/sec CCA:  79 cm/sec SYSTOLIC ICA/CCA RATIO:  [id number] ECA:  164 cm/sec Right Brachial SBP: Not acquired Left Brachial SBP: Not acquired RIGHT CAROTID ARTERY: Atherosclerotic changes of the right common carotid artery. No significant calcifications. Heterogeneous plaque with minimal calcifications at the right carotid bifurcation. Low resistance waveform of the right ICA. No significant tortuosity. RIGHT VERTEBRAL ARTERY: Diminutive flow of the right vertebral artery, with antegrade waveform. LEFT CAROTID ARTERY: Heterogeneous plaque of the left common carotid artery with thickening of the intima. No calcifications. Heterogeneous and partially calcified plaque at the left carotid bifurcation. Low resistance waveform of the left ICA. No significant tortuosity. LEFT VERTEBRAL ARTERY: Diminutive flow of the left vertebral artery. IMPRESSION: Color duplex indicates minimal heterogeneous and calcified plaque, with no hemodynamically significant stenosis by duplex criteria in the extracranial cerebrovascular circulation. Bilateral vertebral arteries demonstrate diminutive flow. This may be artifactual, or could potentially represent vertebrobasilar disease. If there is concern for further evaluation, CT angiogram of the neck may be considered. Signed, Dulcy Fanny. Earleen Newport, DO [facility] Radiology Electronically Signed   By: Corrie Mckusick D.O.   On: 11/10/2015 13:01     Medical Consultants:    None.  Anti-Infectives:   Anti-infectives    None      Subjective:    Eugene [name] he denies chest pain or shortness of breath. He related he did not have chest pain  on admission it was more like a discomfort around the fifth intercostal space not made worse by exertion or better with rest.  Objective:    Filed Vitals:   11/11/15 0330 11/11/15 0400 11/11/15 0500 11/11/15 0700  BP:  107/63  154/72  Pulse: 56 54  63  Temp:  98.2 F (36.8 C)    TempSrc:  Oral    Resp: 17 15  14   Height:      Weight:   120.3 kg (265 lb 3.4 oz)   SpO2: 84% 96%  95%    Intake/Output Summary (Last 24 hours) at 11/11/15 0759 Last data filed at 11/11/15 0400  Gross per 24 hour  Intake   1180 ml  Output      0 ml  Net   1180 ml   Filed Weights   11/09/15 2320 11/10/15 0500 11/11/15 0500  Weight: 118.5 kg (261 lb 3.9 oz) 118.5 kg (261 lb 3.9 oz) 120.3 kg (265 lb 3.4 oz)    Exam: Gen:  NAD Cardiovascular:  RRR, No M/R/G Chest and lungs:   CTAB Abdomen:  Abdomen soft, NT/ND, + BS Extremities:  Trace edema   Data Reviewed:    Labs: Basic Metabolic Panel:  Recent Labs Lab 11/09/15 1636 11/10/15 0521 11/11/15 0403  NA 134* 138 138  K 4.5 3.9 4.1  CL 98* 101 100*  CO2 29 28 30   GLUCOSE 225* 148* 113*  BUN 32* 30* 41*  CREATININE 1.26* 1.14 2.15*  CALCIUM 8.8* 8.7* 8.5*   GFR Estimated Creatinine Clearance: 50.9 mL/min (by C-G formula based on Cr of 2.15). Liver Function Tests: No results for input(s): AST, ALT, ALKPHOS, BILITOT, PROT, ALBUMIN in the last 168 hours. No results for input(s): LIPASE, AMYLASE in the last 168 hours. No results for input(s): AMMONIA in the last 168 hours. Coagulation profile No results for input(s): INR, PROTIME in the last 168 hours.  CBC:  Recent Labs Lab 11/09/15 1636 11/10/15 0521 11/11/15 0403  WBC 6.8 8.2 6.8  NEUTROABS 4.4  --   --   HGB 12.5* 11.6* 10.7*  HCT 37.3* 34.9* 32.2*  MCV 87.4 87.5 89.0  PLT 187 191 167   Cardiac Enzymes:  Recent Labs Lab 11/09/15 1636 11/10/15 0044 11/10/15 0521 11/10/15 1214  TROPONINI 0.04* 0.05* 0.04* 0.04*   BNP (last 3 results) No results for input(s):  PROBNP in the last 8760 hours. CBG:  Recent Labs Lab 11/10/15 0450 11/10/15 0804 11/10/15 1138 11/10/15 1616 11/10/15 2140  GLUCAP 143* 111* 202* 178* 185*   D-Dimer: No results for input(s): DDIMER in the last 72 hours. Hgb A1c:  Recent Labs  11/10/15 0044  HGBA1C 9.1*   Lipid Profile:  Recent Labs  11/10/15 0521  CHOL 274*  HDL 28*  LDLCALC UNABLE TO CALCULATE IF TRIGLYCERIDE OVER 400 mg/dL  TRIG 550*  CHOLHDL 9.8   Thyroid function studies: No results for input(s): TSH, T4TOTAL, T3FREE, THYROIDAB in the last 72 hours.  Invalid input(s): FREET3 Anemia work up: No results for input(s): VITAMINB12, FOLATE, FERRITIN, TIBC, IRON, RETICCTPCT in the last 72 hours. Sepsis Labs:  Recent Labs Lab 11/09/15 1636 11/10/15 0521 11/11/15 0403  WBC 6.8 8.2 6.8   Microbiology Recent Results (from the past 240 hour(s))  MRSA PCR Screening     Status: None   Collection Time: 11/09/15 11:15 PM  Result Value Ref Range Status   MRSA by PCR NEGATIVE NEGATIVE Final    Comment:        The GeneXpert MRSA Assay (FDA approved for NASAL specimens only), is one component of a comprehensive MRSA colonization surveillance program. It is not intended to diagnose MRSA infection nor to guide or monitor treatment for MRSA infections.      Medications:   . aspirin EC  81 mg Oral QHS  . cloNIDine  0.1 mg Oral BID  . clopidogrel  75 mg Oral QHS  . enoxaparin (LOVENOX) injection  40 mg Subcutaneous Q24H  . fenofibrate  160 mg Oral Daily  . hydrALAZINE  50 mg Oral BID  . insulin aspart  0-5 Units Subcutaneous QHS  . insulin aspart  0-9 Units Subcutaneous TID WC  . insulin glargine  80 Units Subcutaneous Q2200  . losartan  100 mg Oral Daily  . metoprolol tartrate  50 mg Oral BID  . pantoprazole  40 mg Oral Daily  . pravastatin  20 mg Oral q1800  . sodium chloride flush  3 mL Intravenous Q12H   Continuous Infusions: . nitroGLYCERIN Stopped (11/10/15 1400)    Time  spent: 25 min   LOS: 2 days   Charlynne Cousins  [facility] Pager [phone]  *Please refer to [email or web address], password [email or web address] to get updated schedule on who will round on this patient, as hospitalists switch teams weekly. If 7PM-7AM, please contact night-coverage at [email or web address], password [email or web address] for any overnight needs.  11/11/2015, 7:59 AM

## 2015-11-12 DIAGNOSIS — R0989 Other specified symptoms and signs involving the circulatory and respiratory systems: Secondary | ICD-10-CM

## 2015-11-12 LAB — GLUCOSE, CAPILLARY
GLUCOSE-CAPILLARY: 173 mg/dL — AB (ref 65–99)
GLUCOSE-CAPILLARY: 162 mg/dL — AB (ref 65–99)
Glucose-Capillary: 124 mg/dL — ABNORMAL HIGH (ref 65–99)
Glucose-Capillary: 130 mg/dL — ABNORMAL HIGH (ref 65–99)

## 2015-11-12 MED ORDER — INSULIN GLARGINE 100 UNIT/ML ~~LOC~~ SOLN
35.0000 [IU] | Freq: Two times a day (BID) | SUBCUTANEOUS | Status: DC
Start: 2015-11-12 — End: 2015-11-13
  Administered 2015-11-12 – 2015-11-13 (×3): 35 [IU] via SUBCUTANEOUS
  Filled 2015-11-12 (×5): qty 0.35

## 2015-11-12 MED ORDER — DEXTROSE 50 % IV SOLN
INTRAVENOUS | Status: AC
  Filled 2015-11-12: qty 50

## 2015-11-12 NOTE — Progress Notes (Signed)
Patient not using CPAP

## 2015-11-12 NOTE — Progress Notes (Signed)
[facility] PROGRESS NOTE    Progress Note   Macari Zalesky Soland [id number] DOB: 01/26/1956 DOA: 11/09/2015 PCP: [name], MD   Brief Narrative:   Marq Rebello Myron is an 61 y.o. male past medical history of CAD status post PCI with a DES stent, chronic diastolic heart failure hypertension diabetes mellitus type 2 with chronic kidney disease to history presented to the ED with complaint of severe headache for the past 2 days.  Assessment/Plan:   Hypertensive urgency/  Essential hypertension: Blood pressure has improved significantly. Currently symptom free. Can transfer to telemetry floor.  Chest pain/elevated troponins: Unlikely cardiac, he had barely elevated cardiac biomarkers, which have plateaued a 0.4 in the setting of chronic renal disease. 2-D echo was done that showed an EF of 55% with grade 2 diastolic heart failure normal motion abnormality,, EKG shows sinus rhythm with T-wave inversions in 1 and aVL, unchanged from previous.  Chronic renal insufficiency, stage III (moderate): It is hard to place a number on his creatinine, looking back to the chart and his history he fluctuates up and down,  he is definitely at least chronic kidney disease stage III. The reason his creatinine was artificially low on admission was probably due to sheer force due to elevated blood pressure. Basic met panel is pending.  Hyperlipidemia: Patient's was started on TriCor, he cannot tolerate statin as per cardiology notes. tolerating pravachol.  Type 2 diabetes mellitus with neurological manifestations ([place]) His last A1c was 9.1, which points towards noncompliant with his regimen. His blood glucose has been in excellent control when his regimen was started in the hospital.  Right carotid bruit: No ICA stenosis bilaterally. The material genus plaque but no hemodynamic changes.  Obstructive sleep apnea- on C-pap Cont C Pap.  Gastroesophageal reflux disease Cont  PPI.  Anemia, normocytic normochromic Hemoglobin stable.  CAD (coronary artery disease): Currently asymptomatic, continue Plavix and low-dose protocol.     DVT Prophylaxis - Lovenox ordered.  Family Communication: none Disposition Plan: Transfer to telemetry. Code Status:     Code Status Orders        Start     Ordered   11/10/15 0015  Full code   Continuous     11/10/15 0018    Code Status History    Date Active Date Inactive Code Status Order ID Comments User Context   05/26/2015  6:38 PM 05/27/2015  4:47 PM Full Code [id number]  Waldemar Dickens, MD ED   01/21/2015  5:43 AM 01/22/2015  9:09 PM Full Code [id number]  Orvan Falconer, MD Inpatient   12/25/2014  7:27 PM 12/27/2014  3:21 PM Full Code [id number]  Charlynne Cousins, MD Inpatient   07/03/2014  8:42 PM 07/05/2014  2:49 PM Full Code [id number]  Waldemar Dickens, MD Inpatient   07/03/2014  8:42 PM 07/03/2014  8:42 PM Full Code [id number]  Waldemar Dickens, MD Inpatient   01/26/2014  2:13 AM 01/27/2014  6:15 PM Full Code [id number]  Barton Dubois, MD Inpatient   10/15/2013 11:09 PM 10/17/2013  9:37 PM Full Code [id number]  Bonnielee Haff, MD Inpatient   06/23/2013  5:10 PM 06/26/2013  3:29 PM Full Code [id number]  [name], PA-C Inpatient   06/19/2013  3:24 AM 06/23/2013  5:10 PM Full Code [id number]  Stephani Police, MD Inpatient   05/09/2012  5:57 PM 05/11/2012 12:48 PM Full Code [id number]  Ena Dawley, RN Inpatient   05/25/2011  9:24 PM 05/30/2011  4:25 PM Full Code [id number]  Bella Kennedy, RN Inpatient        IV Access:    Peripheral IV   Procedures and diagnostic studies:   US Carotid Bilateral  11-14-15  CLINICAL DATA:  60 year old male with a history of right carotid bruit. Cardiovascular risk factors include hypertension, known coronary artery disease, hyperlipidemia, diabetes. EXAM: BILATERAL CAROTID DUPLEX ULTRASOUND TECHNIQUE: Pearline Cables scale imaging, color Doppler and duplex ultrasound were performed of bilateral  carotid and vertebral arteries in the neck. COMPARISON:  09/12/2004 FINDINGS: Criteria: Quantification of carotid stenosis is based on velocity parameters that correlate the residual internal carotid diameter with NASCET-based stenosis levels, using the diameter of the distal internal carotid lumen as the denominator for stenosis measurement. The following velocity measurements were obtained: RIGHT ICA:  Systolic 997 cm/sec, Diastolic 35 cm/sec CCA:  79 cm/sec SYSTOLIC ICA/CCA RATIO:  1.4 ECA:  130 cm/sec LEFT ICA:  Systolic 92 cm/sec, Diastolic 25 cm/sec CCA:  79 cm/sec SYSTOLIC ICA/CCA RATIO:  7.41 ECA:  164 cm/sec Right Brachial SBP: Not acquired Left Brachial SBP: Not acquired RIGHT CAROTID ARTERY: Atherosclerotic changes of the right common carotid artery. No significant calcifications. Heterogeneous plaque with minimal calcifications at the right carotid bifurcation. Low resistance waveform of the right ICA. No significant tortuosity. RIGHT VERTEBRAL ARTERY: Diminutive flow of the right vertebral artery, with antegrade waveform. LEFT CAROTID ARTERY: Heterogeneous plaque of the left common carotid artery with thickening of the intima. No calcifications. Heterogeneous and partially calcified plaque at the left carotid bifurcation. Low resistance waveform of the left ICA. No significant tortuosity. LEFT VERTEBRAL ARTERY: Diminutive flow of the left vertebral artery. IMPRESSION: Color duplex indicates minimal heterogeneous and calcified plaque, with no hemodynamically significant stenosis by duplex criteria in the extracranial cerebrovascular circulation. Bilateral vertebral arteries demonstrate diminutive flow. This may be artifactual, or could potentially represent vertebrobasilar disease. If there is concern for further evaluation, CT angiogram of the neck may be considered. Signed, Dulcy Fanny. Earleen Newport, DO [facility] Radiology Electronically Signed   By: Corrie Mckusick D.O.   On: November 14, 2015 13:01     Medical Consultants:    None.  Anti-Infectives:   Anti-infectives    None      Subjective:    Jeancarlo Leffler Shehata he denies chest pain or shortness of breath. He related he did not have chest pain on admission it was more like a discomfort around the fifth intercostal space not made worse by exertion or better with rest.  Objective:    Filed Vitals:   11/12/15 0300 11/12/15 0400 11/12/15 0700 11/12/15 0801  BP: 108/67 104/63 124/72   Pulse: 58 54 63 58  Temp:  98.4 F (36.9 C)    TempSrc:  Oral    Resp: _0 Height:      Weight:  120.8 kg (266 lb 5.1 oz)    SpO2: 95% 98% 98%     Intake/Output Summary (Last 24 hours) at 11/12/15 0804 Last data filed at 11/12/15 0610  Gross per 24 hour  Intake    720 ml  Output    750 ml  Net    -30 ml   Filed Weights   14-Nov-2015 0500 11/11/15 0500 11/12/15 0400  Weight: 118.5 kg (261 lb 3.9 oz) 120.3 kg (265 lb 3.4 oz) 120.8 kg (266 lb 5.1 oz)    Exam: Gen:  NAD Cardiovascular:  RRR, No M/R/G Chest and lungs:   CTAB Abdomen:  Abdomen soft, NT/ND, + BS Extremities:  Trace edema   Data Reviewed:    Labs: Basic Metabolic Panel:  Recent Labs Lab 11/09/15 1636 11/10/15 0521 11/11/15 0403  NA 134* 138 138  K 4.5 3.9 4.1  CL 98* 101 100*  CO2 _0 GLUCOSE 225* 148* 113*  BUN 32* 30* 41*  CREATININE 1.26* 1.14 2.15*  CALCIUM 8.8* 8.7* 8.5*   GFR Estimated Creatinine Clearance: 51 mL/min (by C-G formula based on Cr of 2.15). Liver Function Tests: No results for input(s): AST, ALT, ALKPHOS, BILITOT, PROT, ALBUMIN in the last 168 hours. No results for input(s): LIPASE, AMYLASE in the last 168 hours. No results for input(s): AMMONIA in the last 168 hours. Coagulation profile No results for input(s): INR, PROTIME in the last 168 hours.  CBC:  Recent Labs Lab 11/09/15 1636 11/10/15 0521 11/11/15 0403  WBC 6.8 8.2 6.8  NEUTROABS 4.4  --   --   HGB 12.5*  11.6* 10.7*  HCT 37.3* 34.9* 32.2*  MCV 87.4 87.5 89.0  PLT 187 191 167   Cardiac Enzymes:  Recent Labs Lab 11/09/15 1636 11/10/15 0044 11/10/15 0521 11/10/15 1214  TROPONINI 0.04* 0.05* 0.04* 0.04*   BNP (last 3 results) No results for input(s): PROBNP in the last 8760 hours. CBG:  Recent Labs Lab 11/11/15 0800 11/11/15 1149 11/11/15 1625 11/11/15 2201 11/12/15 0731  GLUCAP 71 142* 164* 216* 124*   D-Dimer: No results for input(s): DDIMER in the last 72 hours. Hgb A1c:  Recent Labs  11/10/15 0044  HGBA1C 9.1*   Lipid Profile:  Recent Labs  11/10/15 0521  CHOL 274*  HDL 28*  LDLCALC UNABLE TO CALCULATE IF TRIGLYCERIDE OVER 400 mg/dL  TRIG 550*  CHOLHDL 9.8   Thyroid function studies: No results for input(s): TSH, T4TOTAL, T3FREE, THYROIDAB in the last 72 hours.  Invalid input(s): FREET3 Anemia work up: No results for input(s): VITAMINB12, FOLATE, FERRITIN, TIBC, IRON, RETICCTPCT in the last 72 hours. Sepsis Labs:  Recent Labs Lab 11/09/15 1636 11/10/15 0521 11/11/15 0403  WBC 6.8 8.2 6.8   Microbiology Recent Results (from the past 240 hour(s))  MRSA PCR Screening     Status: None   Collection Time: 11/09/15 11:15 PM  Result Value Ref Range Status   MRSA by PCR NEGATIVE NEGATIVE Final    Comment:        The GeneXpert MRSA Assay (FDA approved for NASAL specimens only), is one component of a comprehensive MRSA colonization surveillance program. It is not intended to diagnose MRSA infection nor to guide or monitor treatment for MRSA infections.      Medications:   . aspirin EC  81 mg Oral QHS  . cloNIDine  0.1 mg Oral BID  . clopidogrel  75 mg Oral QHS  . enoxaparin (LOVENOX) injection  40 mg Subcutaneous Q24H  . fenofibrate  160 mg Oral Daily  . hydrALAZINE  50 mg Oral BID  . insulin aspart  0-5 Units Subcutaneous QHS  . insulin aspart  0-9 Units Subcutaneous TID WC  . insulin glargine  80 Units Subcutaneous Q2200  . losartan   100 mg Oral Daily  . metoprolol tartrate  50 mg Oral BID  . pantoprazole  40 mg Oral Daily  . pravastatin  20 mg Oral q1800   Continuous Infusions:    Time spent: 15 min   LOS: 3 days   Charlynne Cousins  [facility] Pager [phone]  *Please refer to [email or web address], password [email or web address] to get updated schedule on who will  round on this patient, as hospitalists switch teams weekly. If 7PM-7AM, please contact night-coverage at [email or web address], password [email or web address] for any overnight needs.  11/12/2015, 8:04 AM

## 2015-11-13 LAB — BASIC METABOLIC PANEL
Anion gap: 7 (ref 5–15)
BUN: 45 mg/dL — ABNORMAL HIGH (ref 6–20)
CHLORIDE: 102 mmol/L (ref 101–111)
CO2: 28 mmol/L (ref 22–32)
CREATININE: 1.66 mg/dL — AB (ref 0.61–1.24)
Calcium: 8.6 mg/dL — ABNORMAL LOW (ref 8.9–10.3)
GFR, EST AFRICAN AMERICAN: 51 mL/min — AB (ref >60)
GFR, EST NON AFRICAN AMERICAN: 44 mL/min — AB (ref >60)
Glucose, Bld: 128 mg/dL — ABNORMAL HIGH (ref 65–99)
Potassium: 4.4 mmol/L (ref 3.5–5.1)
SODIUM: 137 mmol/L (ref 135–145)

## 2015-11-13 LAB — GLUCOSE, CAPILLARY: Glucose-Capillary: 101 mg/dL — ABNORMAL HIGH (ref 65–99)

## 2015-11-13 MED ORDER — PRAVASTATIN SODIUM 20 MG PO TABS: 20.0000 mg | ORAL_TABLET | Freq: Every day | ORAL | Status: DC

## 2015-11-13 MED ORDER — TORSEMIDE 20 MG PO TABS
20.0000 mg | ORAL_TABLET | Freq: Every day | ORAL | Status: DC
  Administered 2015-11-13: 20 mg via ORAL
  Filled 2015-11-13: qty 1

## 2015-11-13 MED ORDER — INSULIN ASPART 100 UNIT/ML CARTRIDGE (PENFILL)
5.0000 [IU] | Freq: Three times a day (TID) | SUBCUTANEOUS | Status: DC
Start: 2015-11-13 — End: 2016-05-07

## 2015-11-13 NOTE — Progress Notes (Signed)
Patient A&O, IV removed, no complaints of pain/distress, Discharge instructions and prescriptions given. Patient verbalized understanding of instructions. Patient left floor via wheelchair with nursing staff and discharged home.

## 2015-11-13 NOTE — Discharge Summary (Signed)
Physician Discharge Summary  Eugene [name] [id number] DOB: 1955-12-03 DOA: 11/09/2015  PCP: [name], MD  Admit date: 11/09/2015 Discharge date: 11/13/2015  Time spent: 35 minutes  Recommendations for Outpatient Follow-up:  1. Follow-up with PCP as an outpatient titrate antihypertensive medications as needed. 2. Check a basic metabolic panel in 4-6 weeks to monitor creatinine. 3. Check LFTs in 6 weeks. As he was started on statins.   Discharge Diagnoses:  Principal Problem:   Hypertensive urgency Active Problems:   Essential hypertension   Chronic renal insufficiency, stage III (moderate)   Obstructive sleep apnea- on C-pap   Gastroesophageal reflux disease   Anemia, normocytic normochromic   CAD (coronary artery disease)   Type 2 diabetes mellitus with neurological manifestations (HCC)   Chest pain   HLD (hyperlipidemia)   Diastolic CHF (HCC)   Right carotid bruit   Pain in the chest   Discharge Condition: stable  Diet recommendation: Heart healthy  Filed Weights   11/10/15 0500 11/11/15 0500 11/12/15 0400  Weight: 118.5 kg (261 lb 3.9 oz) 120.3 kg (265 lb 3.4 oz) 120.8 kg (266 lb 5.1 oz)    History of present illness:  60 y.o. male with a history of CAD S/P PCI with DES stents, Diastolic CHF, HTN, DM2, Stage III CKD who presents to the ED with complaints of severe 10/10 headaches for the past 2 days and intermittent substernal chest pain. He reports that his blood pressure have been high despite taking his medications. He was found to have a blood pressure of 220/120 in the ED and was initially administered IV Hydralazine with no effect. A ct Scan of the Head was performed and was negative for acute findings.  Hospital Course:  Hypertensive urgency/ Essential hypertension: He was admitted to the ICU started on nitroglycerin drip, his oral medications were started and his blood pressure improved. The most likely cause of his hypertensive urgency was  slightly noncompliant of his medication. As he was started on his oral meds blood pressure came down to 120/80's.  Chest pain/elevated troponins: Unlikely cardiac, he had barely elevated cardiac biomarkers, which have plateaued a 0.4 in the setting of chronic renal disease. 2-D echo was done that showed an EF of 55% with grade 2 diastolic heart failure normal motion abnormality,, EKG shows sinus rhythm with T-wave inversions in 1 and aVL, unchanged from previous.  Chronic renal insufficiency, stage III (moderate): It is hard to place a number on his creatinine, looking back to the chart and his history he fluctuates up and down, he is definitely at least chronic kidney disease stage III. Mild increasing in his creatinine on admission this was likely due to the elevated high blood pressure which was creating an artificially low creatinine. His baseline creatinines around 1.6-2. Will need a is an outpatient.  Hyperlipidemia: Patient's was started on TriCor,  Started on Pravachol which he is tolerating 5. Will need a LFTs in 6 weeks.  Type 2 diabetes mellitus with neurological manifestations ([place]) His last A1c was 9.1, which points towards noncompliant with his regimen. His blood glucose has been in excellent control when his regimen was started in the hospital. Cont long acting insulin start on short acting insulin with meals.  Right carotid bruit: No ICA stenosis bilaterally. There plaque with  no hemodynamic changes.  Obstructive sleep apnea- on C-pap Cont C Pap.  Gastroesophageal reflux disease Cont PPI.  Anemia, normocytic normochromic Hemoglobin stable.  CAD (coronary artery disease): Currently asymptomatic, continue Plavix and low-dose protocol.  Procedures:  CT head  Carotid doppler  ECHO   Consultations:  none  Discharge Exam: Filed Vitals:   11/13/15 0500 11/13/15 0700  BP: 119/69 122/73  Pulse: 62 60  Temp:    Resp: 19 17    General: A&O  x3 Cardiovascular: RRR Respiratory: good air movement CTA B/L  Discharge Instructions   Discharge Instructions    AMB Referral to [facility] Management    Complete by:  As directed   Please refer to [place] for Transition of Care program. Patient active with [facility] care management in the past.  Patient has 2 admits in 6 months. History of HF, DM, CKD and HTN Consent obtained at bedside.  Please contact with any questions or concerns.  Royetta Crochet. Niemczura, RN, BSN, [place] [phone]) Business Cell  [phone]) Toll Free Office  Reason for consult:  [facility] active  Diagnoses of:   Heart Failure Diabetes Kidney Failure    Expected date of contact:  1-3 days (reserved for hospital discharges)     Diet - low sodium heart healthy    Complete by:  As directed      Increase activity slowly    Complete by:  As directed           Current Discharge Medication List    START taking these medications   Details  insulin aspart (NOVOLOG PENFILL) cartridge Inject 5 Units into the skin 3 (three) times daily with meals. Qty: 15 mL, Refills: 11    pravastatin (PRAVACHOL) 20 MG tablet Take 1 tablet (20 mg total) by mouth daily at 6 PM. Qty: 30 tablet, Refills: 0      CONTINUE these medications which have NOT CHANGED   Details  acetaminophen (TYLENOL) 325 MG tablet Take 650 mg by mouth every 6 (six) hours as needed for mild pain.    albuterol (PROVENTIL HFA;VENTOLIN HFA) 108 (90 BASE) MCG/ACT inhaler Inhale 2 puffs into the lungs every 4 (four) hours as needed for shortness of breath. Qty: 1 Inhaler, Refills: 2    ALPRAZolam (XANAX) 0.5 MG tablet TAKE 1/2-1 TABLET BY MOUTH AT BEDTIME AS NEEDED Qty: 30 tablet, Refills: 4    aspirin EC 81 MG tablet Take 81 mg by mouth at bedtime.     cloNIDine (CATAPRES) 0.1 MG tablet Take 1 tablet (0.1 mg total) by mouth 2 (two) times daily. Qty: 180 tablet, Refills: 3    clopidogrel (PLAVIX) 75  MG tablet Take 1 tablet (75 mg total) by mouth at bedtime. Qty: 90 tablet, Refills: 1    colchicine (COLCRYS) 0.6 MG tablet 2 now then one bid until relief Qty: 20 tablet, Refills: 3    esomeprazole (NEXIUM) 20 MG capsule Take 20 mg by mouth daily as needed (for acid reflux).    hydrALAZINE (APRESOLINE) 50 MG tablet Take 1 tablet (50 mg total) by mouth 2 (two) times daily. Qty: 180 tablet, Refills: 1    LANTUS SOLOSTAR 100 UNIT/ML Solostar Pen Inject 80 Units as directed at bedtime. Refills: 4    losartan (COZAAR) 100 MG tablet Take 1 tablet (100 mg total) by mouth daily. Qty: 90 tablet, Refills: 1    metoprolol tartrate (LOPRESSOR) 25 MG tablet Take 1 tablet (25 mg total) by mouth 2 (two) times daily. Qty: 180 tablet, Refills: 3    torsemide (DEMADEX) 20 MG tablet Take 2 tablets (40 mg total) by mouth daily. Qty: 180 tablet, Refills: 1    traMADol (ULTRAM)  50 MG tablet TAKE 1 TABLET BY MOUTH EVERY 6 HOURS AS NEEDED FOR PAIN. Qty: 24 tablet, Refills: 4    fenofibrate 160 MG tablet Take 1 tablet (160 mg total) by mouth at bedtime. Qty: 30 tablet, Refills: 5    oxyCODONE-acetaminophen (ROXICET) 5-325 MG tablet Take 1-2 tablets by mouth every 8 (eight) hours as needed for severe pain. Qty: 15 tablet, Refills: 0       Allergies  Allergen Reactions  . Hydrocodone Nausea And Vomiting  . Lisinopril Cough  . Neurontin [Gabapentin] Other (See Comments)    Suicidal thoughts  . Statins Other (See Comments)    Muscle aches  . Metformin And Related Diarrhea and Other (See Comments)    Intestinal side effects  . Norvasc [Amlodipine Besylate] Swelling    Pedal edema   Follow-up Information    Follow up with [name], MD In 2 weeks.   Specialty:  Family Medicine   Contact information:   [address] [place] [address] [phone]        The results of significant diagnostics from this hospitalization (including imaging, microbiology, ancillary and  laboratory) are listed below for reference.    Significant Diagnostic Studies: Dg Chest 2 View  11/09/2015  CLINICAL DATA:  Chest pain EXAM: CHEST  2 VIEW COMPARISON:  CT chest 05/26/2015.  Chest x-ray 05/26/2015. FINDINGS: The lungs are clear wiithout focal pneumonia, edema, pneumothorax or pleural effusion. Interstitial markings are diffusely coarsened with chronic features. The cardiopericardial silhouette is within normal limits for size. IMPRESSION: No active cardiopulmonary disease. Electronically Signed   By: Misty Stanley M.D.   On: 11/09/2015 16:37   Ct Head Wo Contrast  11/09/2015  CLINICAL DATA:  Acute onset of blurred vision and high blood pressure. Shortness of breath and generalized chest pain. Initial encounter. EXAM: CT HEAD WITHOUT CONTRAST TECHNIQUE: Contiguous axial images were obtained from the base of the skull through the vertex without intravenous contrast. COMPARISON:  None. FINDINGS: There is no evidence of acute infarction, mass lesion, or intra- or extra-axial hemorrhage on CT. The posterior fossa, including the cerebellum, brainstem and fourth ventricle, is within normal limits. The third and lateral ventricles, and basal ganglia are unremarkable in appearance. The cerebral hemispheres are symmetric in appearance, with normal gray-white differentiation. No mass effect or midline shift is seen. There is no evidence of fracture; visualized osseous structures are unremarkable in appearance. The visualized portions of the orbits are within normal limits. The paranasal sinuses and mastoid air cells are well-aerated. Scattered chronic soft tissue inflammation is noted about both sides of the head. IMPRESSION: Unremarkable noncontrast CT of the head. Electronically Signed   By: Garald Balding M.D.   On: 11/09/2015 21:13   US Carotid Bilateral  11/10/2015  CLINICAL DATA:  60 year old male with a history of right carotid bruit. Cardiovascular risk factors include hypertension, known  coronary artery disease, hyperlipidemia, diabetes. EXAM: BILATERAL CAROTID DUPLEX ULTRASOUND TECHNIQUE: Pearline Cables scale imaging, color Doppler and duplex ultrasound were performed of bilateral carotid and vertebral arteries in the neck. COMPARISON:  09/12/2004 FINDINGS: Criteria: Quantification of carotid stenosis is based on velocity parameters that correlate the residual internal carotid diameter with NASCET-based stenosis levels, using the diameter of the distal internal carotid lumen as the denominator for stenosis measurement. The following velocity measurements were obtained: RIGHT ICA:  Systolic 0000000 cm/sec, Diastolic 35 cm/sec CCA:  79 cm/sec SYSTOLIC ICA/CCA RATIO:  1.4 ECA:  130 cm/sec LEFT ICA:  Systolic 92 cm/sec, Diastolic  25 cm/sec CCA:  79 cm/sec SYSTOLIC ICA/CCA RATIO:  [id number] ECA:  164 cm/sec Right Brachial SBP: Not acquired Left Brachial SBP: Not acquired RIGHT CAROTID ARTERY: Atherosclerotic changes of the right common carotid artery. No significant calcifications. Heterogeneous plaque with minimal calcifications at the right carotid bifurcation. Low resistance waveform of the right ICA. No significant tortuosity. RIGHT VERTEBRAL ARTERY: Diminutive flow of the right vertebral artery, with antegrade waveform. LEFT CAROTID ARTERY: Heterogeneous plaque of the left common carotid artery with thickening of the intima. No calcifications. Heterogeneous and partially calcified plaque at the left carotid bifurcation. Low resistance waveform of the left ICA. No significant tortuosity. LEFT VERTEBRAL ARTERY: Diminutive flow of the left vertebral artery. IMPRESSION: Color duplex indicates minimal heterogeneous and calcified plaque, with no hemodynamically significant stenosis by duplex criteria in the extracranial cerebrovascular circulation. Bilateral vertebral arteries demonstrate diminutive flow. This may be artifactual, or could potentially represent vertebrobasilar disease. If there is concern for further  evaluation, CT angiogram of the neck may be considered. Signed, Dulcy Fanny. Earleen Newport, DO [facility]iology Electronically Signed   By: Corrie Mckusick D.O.   On: 11/10/2015 13:01    Microbiology: Recent Results (from the past 240 hour(s))  MRSA PCR Screening     Status: None   Collection Time: 11/09/15 11:15 PM  Result Value Ref Range Status   MRSA by PCR NEGATIVE NEGATIVE Final    Comment:        The GeneXpert MRSA Assay (FDA approved for NASAL specimens only), is one component of a comprehensive MRSA colonization surveillance program. It is not intended to diagnose MRSA infection nor to guide or monitor treatment for MRSA infections.      Labs: Basic Metabolic Panel:  Recent Labs Lab 11/09/15 1636 11/10/15 0521 11/11/15 0403 11/13/15 0458  NA 134* 138 138 137  K 4.5 3.9 4.1 4.4  CL 98* 101 100* 102  CO2 29 28 30 28   GLUCOSE 225* 148* 113* 128*  BUN 32* 30* 41* 45*  CREATININE 1.26* 1.14 2.15* 1.66*  CALCIUM 8.8* 8.7* 8.5* 8.6*   Liver Function Tests: No results for input(s): AST, ALT, ALKPHOS, BILITOT, PROT, ALBUMIN in the last 168 hours. No results for input(s): LIPASE, AMYLASE in the last 168 hours. No results for input(s): AMMONIA in the last 168 hours. CBC:  Recent Labs Lab 11/09/15 1636 11/10/15 0521 11/11/15 0403  WBC 6.8 8.2 6.8  NEUTROABS 4.4  --   --   HGB 12.5* 11.6* 10.7*  HCT 37.3* 34.9* 32.2*  MCV 87.4 87.5 89.0  PLT 187 191 167   Cardiac Enzymes:  Recent Labs Lab 11/09/15 1636 11/10/15 0044 11/10/15 0521 11/10/15 1214  TROPONINI 0.04* 0.05* 0.04* 0.04*   BNP: BNP (last 3 results)  Recent Labs  12/25/14 1305 05/26/15 1125 11/09/15 1636  BNP 72.0 120.0* 69.0    ProBNP (last 3 results) No results for input(s): PROBNP in the last 8760 hours.  CBG:  Recent Labs Lab 11/11/15 2201 11/12/15 0731 11/12/15 1104 11/12/15 1614 11/12/15 2122  GLUCAP 216* 124* 130* 173* 162*      Signed:  Charlynne Cousins MD.  [facility] 11/13/2015, 7:42 AM

## 2015-11-14 ENCOUNTER — Other Ambulatory Visit: Payer: Self-pay

## 2015-11-14 NOTE — Patient Outreach (Signed)
[place] [facility]) Care Management  11/14/2015  [place] [birth year] [id number]   Return phone call to patient.  He states that he got out of the hospital on yesterday.  Patient inquiring about free meals through the insurance.  Advised patient that I would inquire and start the process. Also advised patient that I would let Nurse Delrae Rend know that he is out of the hospital.  He verbalized understanding.    Plan: RN Health Coach will contact Alisa to let her know that patient is out of the hospital.   Sloan will find out about meals.   Jone Baseman, RN, MSN [place] [phone]

## 2015-11-14 NOTE — Patient Outreach (Signed)
[place] [facility]) Care Management  11/14/2015  [place] May 17, 1956 [id number]   Telephone call to [facility] Well Dine program to sign patient up for free meals.  Meals to be delivered on 11-18-15 or possibly the day before or after.  Telephone call to patient to notify of Well Dine Status.  Patient verbalized understanding.    Jone Baseman, RN, MSN [place] [phone]

## 2015-11-15 ENCOUNTER — Other Ambulatory Visit: Payer: Self-pay | Admitting: *Deleted

## 2015-11-15 ENCOUNTER — Encounter: Payer: Self-pay | Admitting: *Deleted

## 2015-11-15 NOTE — Patient Outreach (Signed)
[place] [facility]) Care Management  11/14/2015  [place] 1956-04-03 [id number]   I reached out to Mr. Pam today by phone to follow up on his discharge from the hospital yesterday. He was admitted after having had an episode of chest pain with hypertension. Given his history, he was admitted and evaluated for cardiopulmonary process. Mr. Mortel reports that he was told he was "free and clear" and "didn't have a heart attack." Mr. Kollin went on to explain that he'd received "bad news" about a family member who was diagnosed with a terminal illness and an old family friend who he feared had died. In addition, he felt his relationship with his wife was strained around the same period. Mr. Gilpin says he "just got too worked up and it made my pressure go up and I felt like I should go in and let them check me just in case."   Mr. Gotto and I are very familiar as I was his case manager a few years ago and he has called me intermittently with various questions over the last few years. I inquired with him about the status of his diabetes management and he said "I confess. I haven't been taking care of it. I've been eating a cake or a cookie or a bowl of ice cream every night for a few weeks now and it's made my sugar go up. My A1C was over 23!" Mr. Braxton had already developed a plan for dietary management and restarting his exercise regimen. He usually attends the [facility] and had stopped going.   Mr. Flax agreed to weekly calls for the next 3 weeks so that we can continue assisting with resetting and establishing self health management goals.   [place] Management  [phone]

## 2015-11-21 ENCOUNTER — Telehealth: Payer: Self-pay

## 2015-11-21 ENCOUNTER — Other Ambulatory Visit: Payer: Self-pay | Admitting: *Deleted

## 2015-11-21 NOTE — Patient Outreach (Signed)
E[name]) Care Management  11/21/2015  [place] Aug 16, 1956 [id number]  I reached out to Eugene [name] today to follow up on our conversation last week since his discharge from the [name] after he was briefly admitted to evaluate him for hypertension and chest discomfort.   Last week Eugene [name] and I discussed the importance of self monitoring of blood pressure. He has been checking his bp sometimes several times daily finding bp's around 160's/80's in the morning to 110/62 in the afternoon/evening. He had one finding of 187/102. Eugene [name] denies having had any chest pain, increased shortness of breath, dizziness or lightheadedness, or other new or worsened symptom.   Eugene [name] states he rises around 8:00-8:30am but doesn't eat breakfast until 10:30 or 11:00am and doesn't take his medications until he eats. I encouraged Eugene [name] to consider eating a bit earlier and taking his morning medications within an hour of rising. He says he will make this adjustment and we will follow up on results in a week when I return a call to him.   Eugene [name] has an appointment with Eugene [name] on Monday 11/28/15 at 1:40pm.   Plan: I will follow up with Eugene [name] by phone next week.    [place] Management  [phone]

## 2015-11-21 NOTE — Telephone Encounter (Signed)
Per Dr. Nicki Reaper- Patient was in hospital recently and discharged on 11/13/15. [facility] requires follow up office visit. Call patient and make sure he is doing fine. Let patient know Dr. Nicki Reaper recommends follow up office visit next week. Let the front know to change patient's appointment from 4/12 to next week.   Talked with patient and he states that he is doing much better now. Transferred patient to front desk to schedule appointment for next week.

## 2015-11-23 ENCOUNTER — Other Ambulatory Visit: Payer: Self-pay | Admitting: Cardiovascular Disease

## 2015-11-23 ENCOUNTER — Encounter: Payer: Self-pay | Admitting: *Deleted

## 2015-11-24 ENCOUNTER — Encounter (INDEPENDENT_AMBULATORY_CARE_PROVIDER_SITE_OTHER): Payer: Commercial Managed Care - HMO | Admitting: Ophthalmology

## 2015-11-24 DIAGNOSIS — H43813 Vitreous degeneration, bilateral: Secondary | ICD-10-CM

## 2015-11-24 DIAGNOSIS — E113311 Type 2 diabetes mellitus with moderate nonproliferative diabetic retinopathy with macular edema, right eye: Secondary | ICD-10-CM | POA: Diagnosis not present

## 2015-11-24 DIAGNOSIS — I10 Essential (primary) hypertension: Secondary | ICD-10-CM | POA: Diagnosis not present

## 2015-11-24 DIAGNOSIS — H35033 Hypertensive retinopathy, bilateral: Secondary | ICD-10-CM | POA: Diagnosis not present

## 2015-11-24 DIAGNOSIS — E11311 Type 2 diabetes mellitus with unspecified diabetic retinopathy with macular edema: Secondary | ICD-10-CM

## 2015-11-24 DIAGNOSIS — D3132 Benign neoplasm of left choroid: Secondary | ICD-10-CM | POA: Diagnosis not present

## 2015-11-24 DIAGNOSIS — E113512 Type 2 diabetes mellitus with proliferative diabetic retinopathy with macular edema, left eye: Secondary | ICD-10-CM | POA: Diagnosis not present

## 2015-11-24 LAB — HM DIABETES EYE EXAM

## 2015-11-28 ENCOUNTER — Ambulatory Visit (INDEPENDENT_AMBULATORY_CARE_PROVIDER_SITE_OTHER): Payer: Commercial Managed Care - HMO | Admitting: Family Medicine

## 2015-11-28 ENCOUNTER — Other Ambulatory Visit: Payer: Self-pay | Admitting: Family Medicine

## 2015-11-28 ENCOUNTER — Encounter: Payer: Self-pay | Admitting: Family Medicine

## 2015-11-28 ENCOUNTER — Other Ambulatory Visit: Payer: Self-pay | Admitting: *Deleted

## 2015-11-28 VITALS — BP 130/80 | Ht 73.0 in | Wt 260.2 lb

## 2015-11-28 DIAGNOSIS — I10 Essential (primary) hypertension: Secondary | ICD-10-CM

## 2015-11-28 DIAGNOSIS — N289 Disorder of kidney and ureter, unspecified: Secondary | ICD-10-CM | POA: Diagnosis not present

## 2015-11-28 DIAGNOSIS — E1165 Type 2 diabetes mellitus with hyperglycemia: Secondary | ICD-10-CM | POA: Diagnosis not present

## 2015-11-28 DIAGNOSIS — E118 Type 2 diabetes mellitus with unspecified complications: Secondary | ICD-10-CM

## 2015-11-28 DIAGNOSIS — IMO0002 Reserved for concepts with insufficient information to code with codable children: Secondary | ICD-10-CM | POA: Insufficient documentation

## 2015-11-28 DIAGNOSIS — Z794 Long term (current) use of insulin: Secondary | ICD-10-CM

## 2015-11-28 MED ORDER — PRAVASTATIN SODIUM 20 MG PO TABS: 20.0000 mg | ORAL_TABLET | Freq: Every day | ORAL | Status: DC

## 2015-11-28 NOTE — Patient Instructions (Signed)
   Short acting insulin Go to  6  At breakfast 6 at lunch 8 at supper  Long acting insulin 80

## 2015-11-28 NOTE — Progress Notes (Signed)
Subjective:    Patient ID: Eugene [name], male    DOB: 03-30-1956, 60 y.o.   MRN: [id number]  HPI Patient is here today for a hospital follow up visit. Patient was treated at [facility] on 11/09/15 for severe hypertension. Bloodwork was completed while patient was in the hospital.   Patient states that he has no concerns at this time. This patient recently in the hospital with hypertensive urgency he has poorly controlled diabetes he has heart disease obesity does not always eat healthy and is not always physically active he states he is planning on trying to do better  He brings recent blood pressure readings in which showed much better control since coming home he brought in all of his medications we reviewed every single one. Continue current regimen as per medicine list  We talked about the importance of checking sugars more often he did he does not like checking his sugars we will check him in the morning and occasionally later in the day he denies any low sugar spells  He does state that he will do a better job of minimizing carbohydrates starches as well as increasing physical activity     Review of Systems  Constitutional: Negative for activity change, appetite change and fatigue.  HENT: Negative for congestion.   Respiratory: Negative for cough.   Cardiovascular: Negative for chest pain.  Gastrointestinal: Negative for abdominal pain.  Endocrine: Negative for polydipsia and polyphagia.  Neurological: Negative for weakness.  Psychiatric/Behavioral: Negative for confusion.       Objective:   Physical Exam  Constitutional: He appears well-nourished. No distress.  Cardiovascular: Normal rate, regular rhythm and normal heart sounds.   No murmur heard. Pulmonary/Chest: Effort normal and breath sounds normal. No respiratory distress.  Musculoskeletal: He exhibits no edema.  Lymphadenopathy:    He has no cervical adenopathy.  Neurological: He is alert.  Psychiatric: His  behavior is normal.  Vitals reviewed.   25 minutes was spent with the patient. Greater than half the time was spent in discussion and answering questions and counseling regarding the issues that the patient came in for today.  Diabetic foot exam was not completed today because patient has supportive hose on and states that he could not get those back on if he had to take him off next visit he will not wear these so we can check      Assessment & Plan:  HTN- good control currently. Ideally we would like to see the top number lower but I am concerned that if we increase the medication we will cause significant hypotension when he stands stick with current dose healthy eating recommended losing weight recommended regular physical activity and follow-up again in 3 months  Diabetes subpar control patient admits to not eating healthy. We talked at length about the importance of eating healthy regular physical activity he will continue the 80 units of insulin at night and he will increase short acting insulin to use 6 with breakfast and lunch 8 with dinner he will let us know if any low sugar spells he was educated regarding this. I believe patient is having complications from his diabetes including renal insufficiency as well as progressive heart disease  Renal insufficiency significant concerning trend recheck metabolic 7 warranted.  Coronary artery disease stable followed by cardiology  Hyperlipidemia we will be checking lab work again in 3 months time including A1c lipid liver profile patient is to notify us if any muscle discomforts or other problems with current  regimen

## 2015-11-28 NOTE — Telephone Encounter (Signed)
May have this prescription and 4 additional refills

## 2015-11-28 NOTE — Patient Outreach (Addendum)
[place] [facility]) Care Management  11/28/2015  Eugene [name] 09-19-1955 [id number]  Transition of Care Follow Up Week #2 - I spoke with Mr. Eckley by phone today. He saw Dr. [name] in the office for his post hospital follow up. According to Dr. Lance Sell note and Mr. Dimalanta's report, his blood pressure is well controlled. He changed his morning med administration time to 8:30 rather than 10:30 or 11:00, after he has his first morning meal. In addition, Mr. Kyllo has resumed his exercise regimen and is slowly building back up time and intensity. He has also been making efforts to get back to his prescribed heart healthy, carb modified diet.   Mr. Hatch told me he had one episode last week wherein he felt "a little light" and checked his bp, finding it to be around 97/60. He said it came up to the 110's within the hour.   Plan: Mr. Hunting will continue taking his medication as prescribed, first doses of the day at 8:30am, and will continue his exercise and dietary regimen as prescribed. Mr. Vanetten and I reviewed signs and symptoms of hypotension and when to call for help. Mr. Vandecar will check his bp daily and record. I will return a call to him next week.    [place] Management  [phone]

## 2015-11-29 LAB — BASIC METABOLIC PANEL
BUN/Creatinine Ratio: 20 (ref 9–20)
BUN: 32 mg/dL — AB (ref 6–24)
CALCIUM: 9.9 mg/dL (ref 8.7–10.2)
CO2: 24 mmol/L (ref 18–29)
Chloride: 98 mmol/L (ref 96–106)
Creatinine, Ser: 1.64 mg/dL — ABNORMAL HIGH (ref 0.76–1.27)
GFR calc non Af Amer: 45 mL/min/{1.73_m2} — ABNORMAL LOW (ref >59)
GFR, EST AFRICAN AMERICAN: 53 mL/min/{1.73_m2} — AB (ref >59)
Glucose: 157 mg/dL — ABNORMAL HIGH (ref 65–99)
Potassium: 5 mmol/L (ref 3.5–5.2)
Sodium: 139 mmol/L (ref 134–144)

## 2015-11-30 NOTE — Addendum Note (Signed)
Addended by: Ofilia Neas R on: 11/30/2015 10:33 AM   Modules accepted: Orders

## 2015-12-02 ENCOUNTER — Other Ambulatory Visit: Payer: Self-pay | Admitting: *Deleted

## 2015-12-02 NOTE — Patient Outreach (Signed)
[facility]) Care Management  12/02/2015  [place] 02-May-1956 [id number]  I reached out to Mr. Rosebrook by phone today to follow up on adjustments he has made in medication administration times and how his bp/hr are responding. Mr. Kee says he is now taking morning meds around 8:30am rather than 10:30 or 11:00. He read his list of bp's to me which ranged from 113/62 - 144/78. Mr. Olegario Messier denies chest pain, shortness of breath, or other new or worsened symptoms.   Mr. Dupuy told me he began having morning hypoglycemia (70's-80's) when he stopped eating his late evening snack in an attempt to make improvements in his diet. He was continuing his 80u dose of Lantus. He said he discussed this with his doctor and made prescribed changes in insulin dose and cbg's have improved (100's). We reviewed action plan for hypoglycemia and discussed ongoing nutrition and insulin management.   Plan: I will reach out to Mr. Lungren next week for a telephone assessment/transition of care assessment. We discussed today that he feels he will not need/want home visits but would like to return to our telephonic case management program. If he continues to do well, I will transition him after next week's call.    [place] Management  [phone]

## 2015-12-07 ENCOUNTER — Ambulatory Visit: Payer: Commercial Managed Care - HMO | Admitting: Family Medicine

## 2015-12-09 ENCOUNTER — Other Ambulatory Visit: Payer: Self-pay | Admitting: *Deleted

## 2015-12-09 NOTE — Patient Outreach (Signed)
[place] [facility]) Care Management  12/09/2015  Eugene [name] [birth year] [id number]  Unable to reach [name] by phone today for continued transition of care assessment. Left HIPPA compliant voice message. Will reach out to Mr. Eugene next week.    [place] Management  [phone]

## 2015-12-15 ENCOUNTER — Other Ambulatory Visit: Payer: Self-pay | Admitting: *Deleted

## 2015-12-15 NOTE — Patient Outreach (Addendum)
[place] [facility]) Care Management  [place]  12/15/2015   Eugene [name] 05/02/1956 [id number]  Subjective: "I'm feeling so tired and weak. I think I have too much medicine or something. My blood pressure is low."  Objective: I received a call from Eugene [name] today when he wanted to discuss his blood pressure. He has been taking all medications exactly as prescribed since his discharge from the hospital and his follow up provider visits. He is taking Clonidine, Hydralazine, Losartan, and Metoprolol each morning at 8am and has been since his discharge from the hospital on 11/13/15. Eugene [name] has noted bp findings of 88/68-112/78 over the last several days. He has began exercising again and has been adhering to his prescribed diet, decreasing his sodium intake.   Encounter Medications:  Outpatient Encounter Prescriptions as of 12/15/2015  Medication Sig Note  . acetaminophen (TYLENOL) 325 MG tablet Take 650 mg by mouth every 6 (six) hours as needed for mild pain.   [name] albuterol (PROVENTIL HFA;VENTOLIN HFA) 108 (90 BASE) MCG/ACT inhaler Inhale 2 puffs into the lungs every 4 (four) hours as needed for shortness of breath.   . ALPRAZolam (XANAX) 0.5 MG tablet TAKE 1/2-1 TABLET BY MOUTH AT BEDTIME AS NEEDED   . aspirin EC 81 MG tablet Take 81 mg by mouth at bedtime.    . cloNIDine (CATAPRES) 0.1 MG tablet Take 1 tablet (0.1 mg total) by mouth 2 (two) times daily.   . clopidogrel (PLAVIX) 75 MG tablet Take 1 tablet (75 mg total) by mouth at bedtime.   . colchicine (COLCRYS) 0.6 MG tablet 2 now then one bid until relief (Patient taking differently: 0.6 mg daily as needed (gout). 2 now then one bid until relief) 07/28/2015: As needed  . esomeprazole (NEXIUM) 20 MG capsule Take 20 mg by mouth daily as needed (for acid reflux).   . fenofibrate 160 MG tablet Take 1 tablet (160 mg total) by mouth at bedtime. (Patient not taking: Reported on 11/28/2015)   . hydrALAZINE  (APRESOLINE) 50 MG tablet Take 1 tablet (50 mg total) by mouth 2 (two) times daily.   . insulin aspart (NOVOLOG PENFILL) cartridge Inject 5 Units into the skin 3 (three) times daily with meals.   [name] LANTUS SOLOSTAR 100 UNIT/ML Solostar Pen Inject 80 Units as directed at bedtime.   [name] losartan (COZAAR) 100 MG tablet Take 1 tablet (100 mg total) by mouth daily.   . metoprolol tartrate (LOPRESSOR) 25 MG tablet Take 1 tablet (25 mg total) by mouth 2 (two) times daily.   [name] oxyCODONE-acetaminophen (ROXICET) 5-325 MG tablet Take 1-2 tablets by mouth every 8 (eight) hours as needed for severe pain.   . pravastatin (PRAVACHOL) 20 MG tablet Take 1 tablet (20 mg total) by mouth daily at 6 PM.   . torsemide (DEMADEX) 20 MG tablet TAKE 2 TABLETS EVERY DAY   . traMADol (ULTRAM) 50 MG tablet TAKE 1 TABLET BY MOUTH EVERY 6 HOURS AS NEEDED FOR PAIN.    Assessment: 60 year old male patient recently discharged from the hospital and re-engaged in adherent nutrition and exercise regimen.   Chronic Health Condition (HTN) - Eugene [name] is complaining of feeling lightheaded, dizzy, and weak within an hour of taking his morning medications. Eugene [name] states he has resumed his exercise regimen and has been diligent about adhering to his prescribed diet. He is concerned that his blood pressure is too low, at least in the morning, because he correlates his symptoms to  his lower findings especially in the morning.    Plan:   I reached out to [facility] (Dr. McDowell/Dr. Bronson Ing) to report the symptoms and bp findings above.   I advised Eugene [name] NOT to engage in exercise while he is feeling symptomatic and has lower blood pressures and until we hear from his provider re: recommendations.    [place] Management  [phone]

## 2015-12-19 ENCOUNTER — Other Ambulatory Visit: Payer: Self-pay | Admitting: *Deleted

## 2015-12-19 NOTE — Patient Outreach (Signed)
[place] [facility]) Care Management  12/19/2015  [place] 1956/06/13 [id number]   I spoke with [name] by phone this morning, giving him instructions as ordered by Dr. Kate Sable re: changes to his medication regimen as follows:   Clonidine 0.1mg  - discontinue  Hydralazine 50mg  BID - decrease dose to 25mg  BID  Mr. Boorman verbalized understanding.   Plan: I will call Mr. Graddick on Thursday morning to follow up on bp readings this week after the aforementioned changes are made. Mr. Stanforth will call for questions/concerns re: his bp.    [place] Management  [phone]

## 2015-12-22 ENCOUNTER — Encounter (INDEPENDENT_AMBULATORY_CARE_PROVIDER_SITE_OTHER): Payer: Commercial Managed Care - HMO | Admitting: Ophthalmology

## 2015-12-22 DIAGNOSIS — H35033 Hypertensive retinopathy, bilateral: Secondary | ICD-10-CM | POA: Diagnosis not present

## 2015-12-22 DIAGNOSIS — H43813 Vitreous degeneration, bilateral: Secondary | ICD-10-CM | POA: Diagnosis not present

## 2015-12-22 DIAGNOSIS — E113311 Type 2 diabetes mellitus with moderate nonproliferative diabetic retinopathy with macular edema, right eye: Secondary | ICD-10-CM | POA: Diagnosis not present

## 2015-12-22 DIAGNOSIS — D3132 Benign neoplasm of left choroid: Secondary | ICD-10-CM | POA: Diagnosis not present

## 2015-12-22 DIAGNOSIS — E113512 Type 2 diabetes mellitus with proliferative diabetic retinopathy with macular edema, left eye: Secondary | ICD-10-CM

## 2015-12-22 DIAGNOSIS — H2513 Age-related nuclear cataract, bilateral: Secondary | ICD-10-CM

## 2015-12-22 DIAGNOSIS — E11311 Type 2 diabetes mellitus with unspecified diabetic retinopathy with macular edema: Secondary | ICD-10-CM

## 2015-12-22 DIAGNOSIS — I10 Essential (primary) hypertension: Secondary | ICD-10-CM

## 2015-12-23 ENCOUNTER — Other Ambulatory Visit: Payer: Self-pay | Admitting: *Deleted

## 2015-12-23 NOTE — Patient Outreach (Signed)
[place] [facility]) Care Management  12/23/2015  [place] Nov 10, 1955 [id number]   I reached out to Mr. Sensat today to follow up on his daily bp readings and symptoms. Mr Nakagawa states his blood pressures this week have been between 127/82 and 157/88 with average readings 130's/80's. Mr. Hintze's pulse has averaged 74. He denies any further symptoms of fatigue, lightheadedness, visual disturbance.   Plan: Mr. Beaver will continue daily bp checks and strict adherence to medication regimen. I will call Mr. Derousse next week and if he remains stable and no new problems arise, I will transition him back to the health coach.    [place] Management  [phone]

## 2015-12-26 DIAGNOSIS — N289 Disorder of kidney and ureter, unspecified: Secondary | ICD-10-CM | POA: Diagnosis not present

## 2015-12-27 ENCOUNTER — Encounter: Payer: Self-pay | Admitting: Family Medicine

## 2015-12-27 LAB — BASIC METABOLIC PANEL
BUN / CREAT RATIO: 26 — AB (ref 9–20)
BUN: 40 mg/dL — AB (ref 6–24)
CALCIUM: 9.2 mg/dL (ref 8.7–10.2)
CHLORIDE: 103 mmol/L (ref 96–106)
CO2: 21 mmol/L (ref 18–29)
Creatinine, Ser: 1.51 mg/dL — ABNORMAL HIGH (ref 0.76–1.27)
GFR, EST AFRICAN AMERICAN: 58 mL/min/{1.73_m2} — AB (ref >59)
GFR, EST NON AFRICAN AMERICAN: 50 mL/min/{1.73_m2} — AB (ref >59)
Glucose: 174 mg/dL — ABNORMAL HIGH (ref 65–99)
Potassium: 5.3 mmol/L — ABNORMAL HIGH (ref 3.5–5.2)
Sodium: 141 mmol/L (ref 134–144)

## 2015-12-29 ENCOUNTER — Other Ambulatory Visit: Payer: Self-pay | Admitting: *Deleted

## 2015-12-29 NOTE — Patient Outreach (Addendum)
[place] [facility]) Care Management  12/29/2015  [place] 1955-10-09 [id number]  Chronic Health Condition (HTN) - Telephone follow up with Mr. Thomaston today to check on his blood pressure management. He is checking his bp first thing in the morning and then 1-2 hours after his morning medications because he says he is feeling so tired and "even a little woozy sometimes" after he takes his morning medications.    Medications:  Losartan (Cozaar) 100mg  tablet - 1 tablet daily (8am) Metoprolol Tartrate (Lopressor) 25mg  tablet - 1 tablet two times daily (8am/8pm) Torsemide (Demadex) 20mg  - 2 tablets one time daily (8am)   BP Readings (electronic/digital BP monitor) are as follows:   Morning/before medication: 160/80-190/90 Morning/after medication: 107/68 - 134/82 Evening/before 8pm/evening dose of medication: 160/80-180/90  Chronic Health Condition (DM) - Mr. Tinkey is checking his cbg's as recommended and has been working on increasing daily exercise and improved adherence to his prescribed carb modified diet. His CBG's have remained stable in the 130-150 range on Novolog 5u TID w/ meals and Lantus 60-80u (depending on whether he eats an evening snack) qhs.   Plan: I will notify Dr. Bronson Ing of Mr. Margraf's symptoms and bp findings and will follow up with Mr. Tiegs thereafter.    [place] Management  [phone]

## 2015-12-31 NOTE — Addendum Note (Signed)
Addended by: Clerance Lav on: 12/31/2015 10:00 AM   Modules accepted: Medications

## 2016-01-02 ENCOUNTER — Other Ambulatory Visit: Payer: Self-pay | Admitting: *Deleted

## 2016-01-02 NOTE — Patient Outreach (Signed)
[place] [facility]) Care Management  01/02/2016  [place] 1956-04-04 [id number]   Chronic Health Condition (HTN) - Telephone follow up with Mr. Otts again today to follow up on blood pressure management. He has been having increasingly elevated bp readings despite strict adherence to his medication regimen and improving adherence to his prescribed dietary and exercise regimen. Several weeks ago, Mr. Feehan began to complain of feeling tired and "even a little woozy sometimes" after he taking his morning medications. His Clonidine was discontinued and his Hydralazine was decreased from 50mg  BID to 25mg  BID and Mr. Norville reported improvement in symptoms. However, he has recently has bp findings of > 207/102 and had higher averages in the 160's/90's range. Dr. Bronson Ing recommended increasing his Hydralazine dose to TID. This information has been forwarded to Mr. Fintel. He verbalized understanding.   Medications:  Losartan (Cozaar) 100mg  tablet - 1 tablet daily (8am) Metoprolol Tartrate (Lopressor) 25mg  tablet - 1 tablet two times daily (8am/8pm) Hydralazine (Apresoline) 25mg  - 1 tablet two times daily (8am/8pm) > Increased to 25mg  TID today. Torsemide (Demadex) 20mg  - 2 tablets one time daily (8am)  Plan: Mr. Pallett will report any new or worsened symptoms or BP findings outside established parameters.  I will follow up with Mr. Kempa by phone at the end of this week.   [place] Management  [phone]

## 2016-01-10 ENCOUNTER — Other Ambulatory Visit: Payer: Self-pay | Admitting: *Deleted

## 2016-01-10 NOTE — Patient Outreach (Signed)
[place] [facility]) Care Management  01/10/2016  [place] 1955-12-19 [id number]  I reached out to Eugene [name] today and was able to speak with him briefly but he was traveling and asked if I would return a call to him tomorrow.   Plan: I will call Eugene [name] tomorrow to follow up on HTN Disease Management concerns.    [place] Management  [phone]

## 2016-01-10 NOTE — Patient Outreach (Addendum)
[place] [facility]) Care Management  01/02/2016  [name] September 28, 1955 [id number]  Late Entry  Unable to reach [name] by phone this afternoon to follow up on HTN management. I will reach out to him next week for follow up.    [place] Management  [phone]

## 2016-01-11 ENCOUNTER — Encounter: Payer: Self-pay | Admitting: *Deleted

## 2016-01-11 ENCOUNTER — Other Ambulatory Visit: Payer: Self-pay | Admitting: *Deleted

## 2016-01-11 NOTE — Progress Notes (Signed)
This encounter was created in error - please disregard.

## 2016-01-11 NOTE — Patient Outreach (Signed)
[place] [facility]) Care Management  01/11/2016  Eugene [name] [birth year] [id number]  Chronic Health Condition (HTN) - Telephone follow up with Eugene [name] again today to follow up on blood pressure management. He has been having increasingly elevated bp readings despite strict adherence to his medication regimen and improving adherence to his prescribed dietary and exercise regimen.  Week of 12/19/15 (Monday): Complained of feeling tired and "even a little woozy sometimes" after he taking his morning medications. His Clonidine was discontinued and his Hydralazine was decreased from 50mg  BID to 25mg  BID and Eugene [name] reported improvement in symptoms.  12/29/15 (Thursday): BP reading of 207/102 and had higher averages in the 160's/90's range. Dr. Bronson Ing recommended increasing his Hydralazine dose to TID.   01/02/16 (Tuesday): Reported bp readings of 180-190/90's upon rising, 110-130/80's after morning medications, 140-150/80's before bed  TODAY: Morning readings:  179/96, 185/94, 172/85, 171/86  Lunchtime readings:  132/75, 107/58, 145/76, 101/57, 137/69, 141/71 Bedtime readings: 144/78, 139/77, 182/88,159/81, 142/76  Current Medications:  Losartan (Cozaar) 100mg  tablet - 1 tablet daily (8am)  Metoprolol Tartrate (Lopressor) 25mg  tablet - 1 tablet two times daily (8am/8pm)  Hydralazine (Apresoline) 25mg  - 1 tablet three times daily (8am/1pm/8pm)   Torsemide (Demadex) 20mg  - 2 tablets one time daily (8am)  Eugene [name] denies chest pain, shortness of breath, fatigue, visual changes, headache, or any other new or worsened symtpom.    Plan: Updated information sent to Dr. Bronson Ing.    [place] Management  [phone]

## 2016-01-19 ENCOUNTER — Other Ambulatory Visit: Payer: Self-pay | Admitting: *Deleted

## 2016-01-19 NOTE — Patient Outreach (Signed)
[place] [facility]) Care Management  01/19/2016  Eugene [name] 1956-08-07 [id number]   Unable to reach [name] by phone to follow up on hypertension management.   Plan: I will reach out to [name] tomorrow.    [place] Management  [phone]

## 2016-01-20 ENCOUNTER — Other Ambulatory Visit: Payer: Self-pay | Admitting: *Deleted

## 2016-01-20 NOTE — Patient Outreach (Signed)
[place] [facility]) Care Management  01/20/2016  [place] [birth year] [id number]   I spoke with Mr. Sheeley today by phone. He has continued to take medications exactly as prescribed and is checking his bp daily. His bp's first thing in the morning are still higher in the morning 170's/80's-207/109 this morning. I reached out to Dr. Bronson Ing who order increase in evening hydralazine to 75mg . I provided this instruction to Mr. Malvin who verbalized understanding of instructions.   Plan: I will reach out to Mr. Adamian again next week to follow up on his BP and medications.    [place] Management  [phone]

## 2016-01-27 ENCOUNTER — Other Ambulatory Visit: Payer: Self-pay | Admitting: *Deleted

## 2016-01-27 NOTE — Patient Outreach (Signed)
[place] [facility]) Care Management  01/27/2016  Eugene [name] 09-12-1955 [id number]  Unable to reach [name] by phone this afternoon.   Plan: I will follow up with [name] in 2 weeks by phone.    [place] Management  [phone]

## 2016-02-02 ENCOUNTER — Encounter (INDEPENDENT_AMBULATORY_CARE_PROVIDER_SITE_OTHER): Payer: Commercial Managed Care - HMO | Admitting: Ophthalmology

## 2016-02-02 DIAGNOSIS — D3132 Benign neoplasm of left choroid: Secondary | ICD-10-CM

## 2016-02-02 DIAGNOSIS — H43813 Vitreous degeneration, bilateral: Secondary | ICD-10-CM

## 2016-02-02 DIAGNOSIS — I10 Essential (primary) hypertension: Secondary | ICD-10-CM | POA: Diagnosis not present

## 2016-02-02 DIAGNOSIS — E113311 Type 2 diabetes mellitus with moderate nonproliferative diabetic retinopathy with macular edema, right eye: Secondary | ICD-10-CM

## 2016-02-02 DIAGNOSIS — H35033 Hypertensive retinopathy, bilateral: Secondary | ICD-10-CM

## 2016-02-02 DIAGNOSIS — H2513 Age-related nuclear cataract, bilateral: Secondary | ICD-10-CM

## 2016-02-02 DIAGNOSIS — E113512 Type 2 diabetes mellitus with proliferative diabetic retinopathy with macular edema, left eye: Secondary | ICD-10-CM

## 2016-02-02 DIAGNOSIS — E11311 Type 2 diabetes mellitus with unspecified diabetic retinopathy with macular edema: Secondary | ICD-10-CM

## 2016-02-06 ENCOUNTER — Other Ambulatory Visit: Payer: Self-pay | Admitting: *Deleted

## 2016-02-06 NOTE — Patient Outreach (Signed)
[facility] [facility]) Care Management  02/06/2016  [place] 03-16-1956 [id number]  I reached out to Mr. Teed by phone today to follow up on BP management. Mr. Dolton states he has been feeling well and provided the following information regarding his BP management:   Current Medication Regimen:   Losartan (Cozaar) 50 mg tablet - 1 tablet daily each morning (upon rising) (he decreased this dose himself from 100mg  to 50mg  over the weekend when his mid morning bp/post morning meds bp dropped to low 102-105/80's and he felt weak)   Metoprolol Tartrate (Lopressor) 25mg  tablet - 1 tablet two times daily (8am/8pm)    Hydralazine (Apresoline) 25mg  - 1 tablet 8am  Hydralazine (Apresoline) 75mg  q8pm    Torsemide (Demadex) 20mg  - 2 tablets one time daily (8am)    Over the last 10 days, Mr. Barthol reports the following:   Upon rising: 160-180/80's-low 90's  After taking AM medications: 110's-130's/80's  Several other checks throughout the day: 130-150's/80's   He is asymptomatic, other than the weakness he felt with 2 mid morning low's as mentioned above.   Mr. Apostle has lost 22# over the last 2 months with exercise and dietary management.   Plan: In Dr. Court Joy absence, I provided the information listed above to Dr. [name] and will follow up by phone with Mr. Vey with Dr. Nelly Laurence recommendations.    [facility]  [phone]

## 2016-02-07 ENCOUNTER — Other Ambulatory Visit: Payer: Self-pay | Admitting: *Deleted

## 2016-02-07 NOTE — Patient Outreach (Signed)
[facility] [facility]) Care Management  02/07/2016  [place] 1956/08/14 [id number]   I spoke with Eugene [name] by phone again today to follow up on BP management. BP's are essentially unchanged over the last week.    Current Medication Regimen:   Losartan (Cozaar) 50 mg tablet - 1 tablet daily each morning (upon rising) (he decreased this dose himself from 100mg  to 50mg  over the weekend when his mid morning bp/post morning meds bp dropped to low 102-105/80's and he felt weak)   Metoprolol Tartrate (Lopressor) 25mg  tablet - 1 tablet two times daily (8am/8pm)    Hydralazine (Apresoline) 25mg  - 1 tablet 8am  Hydralazine (Apresoline) 75mg  q8pm    Torsemide (Demadex) 20mg  - 2 tablets one time daily (8am)   Over the last 10 days, Eugene [name] reports the following:   Upon rising: 160-180/80's-low 90's  After taking AM medications: 110's-130's/80's  Several other checks throughout the day: 130-150's/80's   He is asymptomatic, other than the weakness he felt with 2 mid morning low's as mentioned above.   Eugene [name] has lost 22# over the last 2 months with exercise and dietary management.   Plan: I will update Dr. Bronson Ing on findings as outlined above. I will follow up with Eugene [name] by phone next week and he will call Dr. Court Joy office or me in the interim if he notes bp findings outside recommended ranges or if he has new symptoms.     [place]  [phone]

## 2016-02-09 ENCOUNTER — Other Ambulatory Visit: Payer: Self-pay | Admitting: Cardiovascular Disease

## 2016-02-09 NOTE — Progress Notes (Signed)
This encounter was created in error - please disregard.

## 2016-02-09 NOTE — Addendum Note (Signed)
Addended by: Dannielle Huh A on: 02/09/2016 10:38 AM   Modules accepted: Level of Service, SmartSet

## 2016-02-13 ENCOUNTER — Other Ambulatory Visit: Payer: Self-pay | Admitting: *Deleted

## 2016-02-13 NOTE — Patient Outreach (Signed)
[facility] [facility]) Care Management  02/13/2016  [place] 02-Aug-1956 [id number]   I called Mr. Custalow today to follow up on BP management but could not reach him at his home number. I left a voice message on his cell phone voice message as he has asked me to leave messages here if I can't reach him. I instructed him to leave his medications as they were as per Dr. Court Joy instructions and to continue monitoring daily as he had been, calling for any findings outside established parameters or if he has symptoms.    Current Medication Regimen:   Losartan (Cozaar) 50 mg tablet - 1 tablet daily each morning (upon rising) (he decreased this dose himself from 100mg  to 50mg  over the weekend when his mid morning bp/post morning meds bp dropped to low 102-105/80's and he felt weak)   Metoprolol Tartrate (Lopressor) 25mg  tablet - 1 tablet two times daily (8am/8pm)    Hydralazine (Apresoline) 25mg  - 1 tablet 8am  Hydralazine (Apresoline) 75mg  q8pm    Torsemide (Demadex) 20mg  - 2 tablets one time daily (8am)   Mr. Madge has lost 22# over the last 2 months with exercise and dietary management.   Plan: I will reach out to Mr. Thon by phone next week to follow up on his HTN management. He has an appointment with his PCP on 03/01/16.    [place] Management  [phone]

## 2016-02-20 ENCOUNTER — Other Ambulatory Visit: Payer: Self-pay | Admitting: *Deleted

## 2016-02-20 NOTE — Patient Outreach (Addendum)
[place] [facility]) Care Management  02/20/2016  (late entry)  [place] [birth year] [id number]  I spoke briefly with [name] late on Friday. He was traveling and we spoke briefly by phone. He reports bp's in the 130's/80's-160/80's during checks throughout the day. I advised him that Dr. Dwana Curd wanted him to remain on his current medication regimen and to notify him if he has new or worsened symptoms or episodes of hyper or hypotension.   Eugene [name] is scheduled to see his primary care provider, Dr. [name] on 03/01/16.   Plan: Eugene [name] will call for new or worsened symptoms or more than 2 bp findings outside parameters noted above. Eugene [name] will see Dr. [name] as scheduled. I will follow up with Eugene [name] after his appointment with Dr. [name] to follow up on any changes and to discuss long term self health maintenance plan.    [facility]  [phone]

## 2016-02-24 ENCOUNTER — Ambulatory Visit: Payer: Self-pay | Admitting: *Deleted

## 2016-03-01 ENCOUNTER — Ambulatory Visit (INDEPENDENT_AMBULATORY_CARE_PROVIDER_SITE_OTHER): Payer: Commercial Managed Care - HMO | Admitting: Family Medicine

## 2016-03-01 ENCOUNTER — Encounter: Payer: Self-pay | Admitting: Family Medicine

## 2016-03-01 VITALS — BP 138/76 | Ht 73.0 in | Wt 265.1 lb

## 2016-03-01 DIAGNOSIS — Z139 Encounter for screening, unspecified: Secondary | ICD-10-CM | POA: Diagnosis not present

## 2016-03-01 DIAGNOSIS — I10 Essential (primary) hypertension: Secondary | ICD-10-CM

## 2016-03-01 DIAGNOSIS — G629 Polyneuropathy, unspecified: Secondary | ICD-10-CM

## 2016-03-01 DIAGNOSIS — N183 Chronic kidney disease, stage 3 unspecified: Secondary | ICD-10-CM

## 2016-03-01 DIAGNOSIS — Z794 Long term (current) use of insulin: Secondary | ICD-10-CM

## 2016-03-01 DIAGNOSIS — E785 Hyperlipidemia, unspecified: Secondary | ICD-10-CM

## 2016-03-01 DIAGNOSIS — N189 Chronic kidney disease, unspecified: Secondary | ICD-10-CM

## 2016-03-01 DIAGNOSIS — G4733 Obstructive sleep apnea (adult) (pediatric): Secondary | ICD-10-CM

## 2016-03-01 DIAGNOSIS — E119 Type 2 diabetes mellitus without complications: Secondary | ICD-10-CM | POA: Diagnosis not present

## 2016-03-01 LAB — POCT GLYCOSYLATED HEMOGLOBIN (HGB A1C): Hemoglobin A1C: 7

## 2016-03-01 NOTE — Progress Notes (Signed)
   Subjective:    Patient ID: Eugene [name], male    DOB: 20-May-1956, 60 y.o.   MRN: [id number]  Diabetes He presents for his follow-up diabetic visit. He has type 2 diabetes mellitus. No MedicAlert identification noted. Pertinent negatives for hypoglycemia include no confusion. Pertinent negatives for diabetes include no chest pain, no fatigue, no polydipsia, no polyphagia and no weakness. He has not had a previous visit with a dietitian. He does not see a podiatrist.Eye exam is current.   Patient has concerns of sinusitis. Discussion with the patient regarding blood pressure, cholesterol, renal insufficiency, pedal edema was covered. Multiple questions answered. Patient does have head congestion drainage denies mucoid drainage more than likely this is allergy related.  Results for orders placed or performed in visit on 03/01/16  POCT HgB A1C  Result Value Ref Range   Hemoglobin A1C 7.0    25 minutes was spent with the patient. Greater than half the time was spent in discussion and answering questions and counseling regarding the issues that the patient came in for today. Patient unable to tolerate statin Taking his blood pressure medicine some of the readings been very high Health nurse working with him. He does follow-up with cardiology as well He does try to monitor his calories as well as salt intake  Review of Systems  Constitutional: Negative for activity change, appetite change and fatigue.  HENT: Negative for congestion.   Respiratory: Negative for cough.   Cardiovascular: Negative for chest pain.  Gastrointestinal: Negative for abdominal pain.  Endocrine: Negative for polydipsia and polyphagia.  Neurological: Negative for weakness.  Psychiatric/Behavioral: Negative for confusion.       Objective:   Physical Exam  Constitutional: He appears well-nourished. No distress.  Cardiovascular: Normal rate, regular rhythm and normal heart sounds.   No murmur  heard. Pulmonary/Chest: Effort normal and breath sounds normal. No respiratory distress.  Musculoskeletal: He exhibits no edema.  Lymphadenopathy:    He has no cervical adenopathy.  Neurological: He is alert.  Psychiatric: His behavior is normal.  Vitals reviewed.         Assessment & Plan:  1. Type 2 diabetes mellitus without complication, with long-term current use of insulin (HCC) Diabetes good control continue current measures follow-up again in 4 months time report to [place] if any low sugar spells - POCT HgB [id number] - Basic metabolic panel  2. Severe hypertension I reviewed dietary measures activity and his medicine regimen. Continue current measures. Check metabolic 7. Blood pressure was checked sitting and standing there was no appreciable drop-off.  3. Obstructive sleep apnea- on C-pap Patient needs to continue using CPAP it does benefit him  4. Peripheral polyneuropathy ([place]) Patient with neuropathy he needs to wear shoes at all times. I did instruct him a proper foot inspection. No sign of any sores.  5. Chronic renal insufficiency, stage III (moderate) Significant insufficiency on previous lab testing repeat metabolic 7 - Basic metabolic panel  6. Hyperlipidemia Patient has not been able to tolerate statins causing joint pains and muscle aches. Repeat lipid profile - Lipid panel  7. Screening Screening for prostate cancer - PSAAllergies related congestion try Claritin daily if this does not help call back

## 2016-03-02 ENCOUNTER — Other Ambulatory Visit: Payer: Self-pay | Admitting: *Deleted

## 2016-03-02 ENCOUNTER — Encounter: Payer: Self-pay | Admitting: *Deleted

## 2016-03-02 NOTE — Patient Outreach (Signed)
Eugene [name] [facility]) Care Management  03/02/2016  [place] 1956-05-18 [id number]   I spoke briefly with Eugene [name] who was in the car with his wife when I called. He reported that his blood pressures "have been about the same" and that he is feeling well and asymptomatic. Eugene [name] is taking all medications as prescribed. He saw Eugene [name] in the office yesterday and says he feels they had a good visit and he got a good report. The following issues were discussed:   DMII - good control on current regimen; no changes; for follow up labs in 4 months; no episodes of hypoglycemia  HTN - Eugene [name] reviewed and reitererated prescribed dietary measures and activity recommendations in addition to the prescribed medication regimen. No changes were made. Eugene [name] is to continue checking his bp daily and is to call for findings out side established parameters.   Plan: no changes in current regimen; Eugene [name] will continue adherence to prescribed medication regimen, exercise and dietary recommendations, daily bp checks with report of any findings outside established parameters or new or worsened symptoms. I will call Eugene [name] in 2 weeks for follow up.    [place] Management  [phone]

## 2016-03-08 ENCOUNTER — Other Ambulatory Visit: Payer: Self-pay | Admitting: *Deleted

## 2016-03-08 DIAGNOSIS — Z794 Long term (current) use of insulin: Principal | ICD-10-CM

## 2016-03-08 DIAGNOSIS — E119 Type 2 diabetes mellitus without complications: Secondary | ICD-10-CM

## 2016-03-08 NOTE — Patient Outreach (Signed)
[place] [facility]) Care Management  03/08/2016  [place] 1956/06/18 [id number]   I spoke with Mr. Ramsdell by phone today when he called to ask for assistance with insulin costs. He said he put the $347 he owed for his insulin on his account at the drug store and will have to pay it off slowly. He is now in the donut hole, much earlier than last year and earlier than anticipated. He asked if I would reach out to the pharmacy team for assistance.   In addition, Mr. Willinger was very happy to discuss again that his primary care provider was very pleased with his improved blood pressure and HgA1C control. Mr. Welby's plan is to continue with his current medication, exercise, and dietary regimen.   Plan: I will refer Mr. Kopack to our pharmacy team and will follow up with him by phone next week.    [place]  [phone]

## 2016-03-14 ENCOUNTER — Other Ambulatory Visit: Payer: Self-pay | Admitting: Pharmacist

## 2016-03-14 NOTE — Patient Outreach (Signed)
Eugene [name] is a 60 y.o. male referred to pharmacy for medication assistance. Per referral from Nurse Care Manager, patient is currently in the donut hole of his prescription coverage. Called and spoke with patient. HIPAA identifiers verified and verbal consent received. Patient reports that he has gone into the donut hole earlier this year than last. Reports that he realized this after his wife picked up his medications for him from the pharmacy last week and he had a charge of $320 on his pharmacy account. However, reports that he now has 10 pens of Lantus, which he states should last him until August.   Note that patient worked with our pharmacy team last year and obtained patient assistance from [employer] for his Lantus.   Let patient know that we will need to complete the extra help application again this year and then, if denied, complete the [employer] patient assistance paperwork. Patient is agreeable to this plan. Let him know that we will need his wife's financial information and consent in order to complete the extra help application. Mr. Crilley states that she is at work today until 6:30 pm and not reachable by phone. He believes that she can be at home on Monday morning to complete the application, but needs to confirm.  Patient reports that he has no further questions for me today. Provide patient with my phone number.  PLAN:  Will call Mr. Weatherley back on Friday, 03/16/16, morning at 10 am to verify Mrs. Quinones's availability. If available, will then call to complete the extra help application with the patient and his wife on Monday, 03/19/16 at 10 am.  Harlow Asa, PharmD [place] [facility] [phone]

## 2016-03-15 ENCOUNTER — Encounter (INDEPENDENT_AMBULATORY_CARE_PROVIDER_SITE_OTHER): Payer: Commercial Managed Care - HMO | Admitting: Ophthalmology

## 2016-03-15 DIAGNOSIS — E113512 Type 2 diabetes mellitus with proliferative diabetic retinopathy with macular edema, left eye: Secondary | ICD-10-CM

## 2016-03-15 DIAGNOSIS — I10 Essential (primary) hypertension: Secondary | ICD-10-CM | POA: Diagnosis not present

## 2016-03-15 DIAGNOSIS — D3132 Benign neoplasm of left choroid: Secondary | ICD-10-CM

## 2016-03-15 DIAGNOSIS — H43813 Vitreous degeneration, bilateral: Secondary | ICD-10-CM | POA: Diagnosis not present

## 2016-03-15 DIAGNOSIS — H35033 Hypertensive retinopathy, bilateral: Secondary | ICD-10-CM | POA: Diagnosis not present

## 2016-03-15 DIAGNOSIS — E113311 Type 2 diabetes mellitus with moderate nonproliferative diabetic retinopathy with macular edema, right eye: Secondary | ICD-10-CM | POA: Diagnosis not present

## 2016-03-15 DIAGNOSIS — E11311 Type 2 diabetes mellitus with unspecified diabetic retinopathy with macular edema: Secondary | ICD-10-CM

## 2016-03-16 ENCOUNTER — Ambulatory Visit: Payer: Self-pay | Admitting: Pharmacist

## 2016-03-16 ENCOUNTER — Other Ambulatory Visit: Payer: Self-pay | Admitting: Pharmacist

## 2016-03-16 NOTE — Patient Outreach (Signed)
Called Mr. Crew back this morning to confirm that both he and his wife will be available on Monday morning.  Confirm that I will call to complete the extra help application with the patient and his wife on Monday, 03/19/16 at 10 am.  Harlow Asa, PharmD [place] [facility] [phone]

## 2016-03-19 ENCOUNTER — Other Ambulatory Visit: Payer: Self-pay | Admitting: Pharmacist

## 2016-03-19 ENCOUNTER — Telehealth: Payer: Self-pay | Admitting: Family Medicine

## 2016-03-19 NOTE — Telephone Encounter (Signed)
[facility] pharmacist is wanting to know if you can help this patient by prescribing either levimir or tresiba instead of lantus for the patient?   This is to be able to get patient assistance for this med since he is in a doughnut hole an unable to afford the lantus   [facility] pharmacist  [phone]

## 2016-03-19 NOTE — Patient Outreach (Signed)
Call to complete the extra help application with the patient and his wife. HIPAA identifiers verified and verbal consent received.  Complete and review the application with the patient and his wife. Mr and Mrs. Primeau consent to have me submit this application on their behalf.   Let Mr. Muska know that he should expect a response from social security in about 4 weeks and ask that he call me at that time to let me know if he is approved or denied and that he retain this letter.  Per Mr. Keetch, he estimates his and his wife's combined annual income to be about $44,000. Note that per the [facility] Patient Assistance Connection website, in order to be eligible for patient assistance for Lantus, the patient must have an annual household income of ?250% of the current Federal Poverty Level. Per the 2017 Poverty Guidelines from the [place] Department of Health and Human Services, this is an annual income of $40,600 for a 2 person household.   Review patient assistance requirements for Levemir and Tresiba from [employer] patient assistance website. Patient reports that he has taken Levemir in the past.  PLAN:  Let Mr. Macquarrie know that I will contact his PCP, Dr. [name] [phone]), to ask if the provider would be willing to switch the patient from Lantus to a Levemir or Tresiba pen in order for [place] to be able to obtain patient assistance through the manufacturer for Mr. Thatch. Note that, as a concentrated insulin, Tyler Aas U-200 may be of a benefit to this patient who is currently taking Lantus 80 units each night at bedtime.  Harlow Asa, PharmD Clinical Pharmacist [place] Management [phone]

## 2016-03-19 NOTE — Patient Outreach (Signed)
Call patient's PCP, Dr. [name] [phone]), to let the provider know that I am helping this patient with medication assistance as he is currently in the donut hole of his Medicare Part D coverage and ask if the provider would be willing to switch the patient from Lantus to a Levemir or Tresiba pen in order for [place] to be able to obtain patient assistance through the manufacturer for Eugene [name]. Note that, as a concentrated insulin, Tyler Aas U-200 may be of a benefit to this patient who is currently taking Lantus 80 units each night at bedtime.  Leave a message with Pamala Hurry in Dr. Lance Sell office. If have not heard back from Dr. Lance Sell office by 03/21/16, will follow up again at that time.  Harlow Asa, PharmD Clinical Pharmacist [place] [facility] [phone]

## 2016-03-20 NOTE — Telephone Encounter (Signed)
levemir is fine, but please talk with pharmacist to find out where that needs to be sent

## 2016-03-20 NOTE — Telephone Encounter (Signed)
Left message return call. 03/20/16

## 2016-03-21 ENCOUNTER — Other Ambulatory Visit: Payer: Self-pay | Admitting: Pharmacist

## 2016-03-21 NOTE — Telephone Encounter (Signed)
Spoke with Benjamine Mola pharmacist with [facility] and informed her per Dr.Scott Luking- Patient's insulin may be switched to Levemir. Benjamine Mola verbalized understanding and stated that at this point there is no where to send the medication. She will fax forms to our office with the program assistance information.

## 2016-03-21 NOTE — Telephone Encounter (Signed)
We will take action on this when they actually send [place] the forms until that happens stick with current plain

## 2016-03-21 NOTE — Patient Outreach (Signed)
Receive a message from [facility] at Dr. Lance Sell office. Per Larene Beach, Dr. [name] is fine with prescribing Levemir for the patient as an alternative to Lantus. Let Larene Beach know that we will need this prescription included in the application when we send over the patient assistance paperwork.  Harlow Asa, PharmD Clinical Pharmacist [place] [facility] [phone]

## 2016-03-22 ENCOUNTER — Other Ambulatory Visit: Payer: Self-pay | Admitting: *Deleted

## 2016-03-22 ENCOUNTER — Encounter: Payer: Self-pay | Admitting: *Deleted

## 2016-03-22 DIAGNOSIS — E785 Hyperlipidemia, unspecified: Secondary | ICD-10-CM | POA: Diagnosis not present

## 2016-03-22 DIAGNOSIS — E119 Type 2 diabetes mellitus without complications: Secondary | ICD-10-CM | POA: Diagnosis not present

## 2016-03-22 DIAGNOSIS — N189 Chronic kidney disease, unspecified: Secondary | ICD-10-CM | POA: Diagnosis not present

## 2016-03-22 DIAGNOSIS — Z139 Encounter for screening, unspecified: Secondary | ICD-10-CM | POA: Diagnosis not present

## 2016-03-22 DIAGNOSIS — Z794 Long term (current) use of insulin: Secondary | ICD-10-CM | POA: Diagnosis not present

## 2016-03-22 NOTE — Patient Outreach (Signed)
[facility]) Care Management  03/22/2016  Eugene [name] Jun 17, 1956 [id number]   I spoke with Eugene [name] by phone today. We reviewed his bp monitoring/recordings and his blood pressure has generally been staying in the 130-150/70-80 range. However, this morning, he took his lopressor dose at the same time as all his other morning medications which is a change for him. He typically takes his Lopressor 1-2 hours after his morning medications because this has historically "made him feel better". About 1 hour after taking his morning medications, including Lopressor, Eugene [name] reported feeling "weak and tired". He checked his bp and found it to be 100/55. He decided to have a small snack, drink a bottle of water, and lie down to rest and felt somewhat better. I advised him to check his bp again a few more times today and to report the findings to his cardiologist if his bp didn't return to his "normal" range and/or if he continued to feel poorly or experience any new or worsened symptoms.   Plan: Eugene [name] will take all medications as prescribed and will take his Lopressor about 1 hour after his morning meds over the next week or until/unless he is advised to do otherwise by one of his providers. I will notify Eugene [name] of this symptom/change. I will reach out to Eugene [name] by phone next week and if Eugene [name] has questions/concerns or is unable to reach his doctor easily, he will call me with any new or worsened (non-emergent) symptoms.    [place] Management  [phone]

## 2016-03-23 LAB — BASIC METABOLIC PANEL
BUN / CREAT RATIO: 21 — AB (ref 9–20)
BUN: 37 mg/dL — ABNORMAL HIGH (ref 6–24)
CHLORIDE: 103 mmol/L (ref 96–106)
CO2: 22 mmol/L (ref 18–29)
Calcium: 9.4 mg/dL (ref 8.7–10.2)
Creatinine, Ser: 1.75 mg/dL — ABNORMAL HIGH (ref 0.76–1.27)
GFR calc non Af Amer: 42 mL/min/{1.73_m2} — ABNORMAL LOW (ref >59)
GFR, EST AFRICAN AMERICAN: 48 mL/min/{1.73_m2} — AB (ref >59)
GLUCOSE: 134 mg/dL — AB (ref 65–99)
POTASSIUM: 5.6 mmol/L — AB (ref 3.5–5.2)
SODIUM: 140 mmol/L (ref 134–144)

## 2016-03-23 LAB — LIPID PANEL
CHOLESTEROL TOTAL: 208 mg/dL — AB (ref 100–199)
Chol/HDL Ratio: 6.1 ratio units — ABNORMAL HIGH (ref 0.0–5.0)
HDL: 34 mg/dL — ABNORMAL LOW (ref >39)
LDL Calculated: 123 mg/dL — ABNORMAL HIGH (ref 0–99)
Triglycerides: 255 mg/dL — ABNORMAL HIGH (ref 0–149)
VLDL Cholesterol Cal: 51 mg/dL — ABNORMAL HIGH (ref 5–40)

## 2016-03-23 LAB — PSA: PROSTATE SPECIFIC AG, SERUM: 0.9 ng/mL (ref 0.0–4.0)

## 2016-03-26 ENCOUNTER — Other Ambulatory Visit: Payer: Self-pay | Admitting: Family Medicine

## 2016-03-26 NOTE — Telephone Encounter (Signed)
Ok plus 3 ref 

## 2016-03-27 NOTE — Addendum Note (Signed)
Addended by: Dairl Ponder on: 03/27/2016 10:11 AM   Modules accepted: Orders

## 2016-03-27 NOTE — Addendum Note (Signed)
Addended by: Dairl Ponder on: 03/27/2016 10:09 AM   Modules accepted: Orders

## 2016-04-02 ENCOUNTER — Other Ambulatory Visit: Payer: Self-pay | Admitting: Pharmacist

## 2016-04-02 NOTE — Patient Outreach (Signed)
Speak with Larene Beach in Dr. Lance Sell office. Let her know that I will be faxing over the patient assistance form for Levemir for [name] today. Let Larene Beach know that an order for pen needles, as well as the Levemir FlexTouch, needs to be included on this application. Confirm office fax number.  Harlow Asa, PharmD Clinical Pharmacist [place] [facility] [phone]

## 2016-04-02 NOTE — Patient Outreach (Signed)
Receive a call from [name] letting me know that he received a denial letter from social security for the extra help application. Review with [name] the requirements of the Levemir patient assistance application. Start the process of filling out this application with the patient over the phone. Let [name] know that I will now mail out to him the application to be completed as well as a cover letter which will include a list of all of the additional documents that the application requires. Let [name] know that I will also fax the portion of the application that is to be completed by the provider out to Dr. [name] today.  [name] reports that he has 4 pens of his Lantus remaining. Discuss with patient of the importance of completing this application as quickly as is possible and ask patient to please call me when he starts to run low, gets close to using that last pen. Patient states that he will.  PLAN:  1) Will mail patient portion of patient assistance application out to [name] today.  2) Will fax provider portion of patient assistance application out to Dr. [name] today.  Eugene [name], Eugene [name] Clinical Pharmacist [place] [facility] [phone]

## 2016-04-03 ENCOUNTER — Telehealth: Payer: Self-pay | Admitting: Family Medicine

## 2016-04-03 NOTE — Telephone Encounter (Signed)
Please complete the form for [employer] Patient Assistance Program.  Please fill out the expiration date of state license, Rx order information and sign.  Please forward back to me so that I can fax.

## 2016-04-04 NOTE — Telephone Encounter (Signed)
This was completed, but I cannot get the fax to go through to fax back to [facility].  I have sent her a staff message asking for another fax number.

## 2016-04-04 NOTE — Telephone Encounter (Signed)
Will try to fax tomorrow as [facility] building is without power today.

## 2016-04-05 NOTE — Telephone Encounter (Signed)
Faxed

## 2016-04-07 ENCOUNTER — Other Ambulatory Visit: Payer: Self-pay | Admitting: Family Medicine

## 2016-04-09 ENCOUNTER — Other Ambulatory Visit: Payer: Self-pay | Admitting: Family Medicine

## 2016-04-11 ENCOUNTER — Other Ambulatory Visit: Payer: Self-pay | Admitting: Pharmacist

## 2016-04-11 ENCOUNTER — Telehealth: Payer: Self-pay | Admitting: Family Medicine

## 2016-04-11 DIAGNOSIS — N189 Chronic kidney disease, unspecified: Secondary | ICD-10-CM | POA: Diagnosis not present

## 2016-04-11 NOTE — Patient Outreach (Signed)
Call and speak with Eugene [name] in Dr. Lance Sell office. Let Eugene [name] know that regarding the Levemir patient assistance program, the program requires documentation from the provider of a reason that the patient requires an insulin pen, rather than a vial, or the program will automatically send the patient the vial. Let Eugene [name] know that Eugene [name] reports that it is difficult for him to read the markings on an insulin syringe.   Eugene [name] states that she will ask Dr. [name] about writing this letter to be included with the patient's application and then fax this back to me or give me a call back.  Harlow Asa, PharmD Clinical Pharmacist [place] [facility] [phone]

## 2016-04-11 NOTE — Telephone Encounter (Signed)
Patient is working on getting Rx assistance for his insulin.  The assistance program requires a letter written from the provider on the reason why the patient should not receive vials instead of the pen.  Eugene [name] doesn't feel comfortable with the vials and using the syringe to draw his medication up because he is afraid he may not be as accurate as he should be.  Can Dr. Nicki Reaper please dictate a letter explaining the visual limitations that patient may have and why he would be a better candidate for the pens instead of vials?     Fax letter to [phone]

## 2016-04-11 NOTE — Patient Outreach (Signed)
Receive a call from Mr. Paynter letting me know that he has received the patient assistance application that I sent in the mail. Reports that he has collected documentation from his pharmacies on his medication out of pocket expense for this calendar year and that thus far he has not met the $1000 out of pocket requirement. Reports that thus far he has spent only about $872 out of pocket. Reports that he currently has a pen and a half of his Lantus insulin left. Reports that he is still working on paying off his bill with the pharmacy from the last time that he purchased his Lantus and that he is unable to purchase the Lantus again.   Architectural technologist to request use of the emergency pharmacy fund to cover a 30 day supply of Lantus for this patient. Patient qualified for a 1 month pharmacy emergency supply of his Lantus based on approval received from [place] due to financial need.  Let Mr. Dreibelbis know that he was approved to receive a one time 30 day supply fill of his Lantus solostar covered by the pharmacy emergency fund. Patient expresses appreciation of the help that he has received from [facility] [facility]. Advise patient to call in a couple of hours to see if the prescription is ready before going to pick it up. Remind patient that when he is at the pharmacy, to obtain a new medical expense report that will include this prescription. Patient verbalizes understanding. Remind patient to return this with all of the other forms for the patient assistance application to me as soon as possible.  Patient expresses concern about the possibility of the patient assistance program sending him vials rather than an insulin pen. Patient reports that it is difficulty for him visually to read the markings on an insulin syringe. Let patient know that I will discuss this with his PCP, Dr. [name], and ask that the provider include a letter to go with the application asking that the patient  specifically receive the pen, rather than a vial due to patient's difficulty visualizing the markings on the syringe.  Mr. Bon reports that he has no further questions for me at this time.  Harlow Asa, PharmD Clinical Pharmacist [place] [facility] [phone]

## 2016-04-12 ENCOUNTER — Other Ambulatory Visit: Payer: Self-pay

## 2016-04-12 DIAGNOSIS — Z79899 Other long term (current) drug therapy: Secondary | ICD-10-CM

## 2016-04-12 LAB — BASIC METABOLIC PANEL
BUN/Creatinine Ratio: 23 — ABNORMAL HIGH (ref 9–20)
BUN: 39 mg/dL — ABNORMAL HIGH (ref 6–24)
CALCIUM: 9.5 mg/dL (ref 8.7–10.2)
CHLORIDE: 102 mmol/L (ref 96–106)
CO2: 18 mmol/L (ref 18–29)
Creatinine, Ser: 1.72 mg/dL — ABNORMAL HIGH (ref 0.76–1.27)
GFR calc Af Amer: 49 mL/min/{1.73_m2} — ABNORMAL LOW (ref >59)
GFR, EST NON AFRICAN AMERICAN: 43 mL/min/{1.73_m2} — AB (ref >59)
Glucose: 185 mg/dL — ABNORMAL HIGH (ref 65–99)
POTASSIUM: 5.7 mmol/L — AB (ref 3.5–5.2)
SODIUM: 137 mmol/L (ref 134–144)

## 2016-04-12 MED ORDER — LOSARTAN POTASSIUM 50 MG PO TABS: 50.0000 mg | ORAL_TABLET | Freq: Every day | ORAL | 1 refills | Status: DC

## 2016-04-15 ENCOUNTER — Encounter: Payer: Self-pay | Admitting: Family Medicine

## 2016-04-15 NOTE — Telephone Encounter (Signed)
Letter dictated thanks

## 2016-04-16 ENCOUNTER — Other Ambulatory Visit: Payer: Self-pay | Admitting: Family Medicine

## 2016-04-17 NOTE — Telephone Encounter (Signed)
This was faxed

## 2016-04-19 ENCOUNTER — Other Ambulatory Visit: Payer: Self-pay | Admitting: *Deleted

## 2016-04-19 NOTE — Patient Outreach (Signed)
[facility]) Care Management  04/19/2016  (LE for 03/22/16 call)  Eugene [name] May 17, 1956 [id number]  Unable to reach Eugene [name] by phone.   Plan: I will follow up with Eugene [name] by phone in AugustJanalyn Shy [place] [facility]  [phone]

## 2016-04-20 ENCOUNTER — Other Ambulatory Visit: Payer: Self-pay | Admitting: Pharmacist

## 2016-04-20 NOTE — Patient Outreach (Signed)
Receive a voicemail from Mr. Perra requesting a call back.  Call and speak with Mr. Gress who lets me know that he received a letter in the mail from Dr. [name] regarding his needing to receive insulin pens, rather than vials of insulin, from the patient assistance program. Let Mr. Augsburger know that I also have a copy of this letter faxed from the physician. Mr. Mazin reports that he mailed his patient assistance paperwork and supporting documents to me earlier this week, on either Monday or Tuesday.  Let Mr. Marsiglia know that once I receive this paperwork, I will fax his full application into [employer].  Harlow Asa, PharmD Clinical Pharmacist [place] [facility] [phone]

## 2016-04-25 ENCOUNTER — Other Ambulatory Visit: Payer: Self-pay | Admitting: Family Medicine

## 2016-04-25 ENCOUNTER — Other Ambulatory Visit: Payer: Self-pay | Admitting: *Deleted

## 2016-04-25 NOTE — Telephone Encounter (Signed)
May have this +4 refills 

## 2016-04-25 NOTE — Patient Outreach (Signed)
[facility]) Care Management  04/25/2016  Eugene [name] 08-Oct-1955 [id number]  Unable to reach Mr. Bejar by phone. HIPPA compliant voice message left requesting return call.   Plan: I will reach out to Mr. Nazareno by phone to follow up.    [place] [facility]  [phone]

## 2016-04-26 ENCOUNTER — Encounter (INDEPENDENT_AMBULATORY_CARE_PROVIDER_SITE_OTHER): Payer: Commercial Managed Care - HMO | Admitting: Ophthalmology

## 2016-04-26 DIAGNOSIS — H43813 Vitreous degeneration, bilateral: Secondary | ICD-10-CM | POA: Diagnosis not present

## 2016-04-26 DIAGNOSIS — I10 Essential (primary) hypertension: Secondary | ICD-10-CM

## 2016-04-26 DIAGNOSIS — E11311 Type 2 diabetes mellitus with unspecified diabetic retinopathy with macular edema: Secondary | ICD-10-CM | POA: Diagnosis not present

## 2016-04-26 DIAGNOSIS — H35033 Hypertensive retinopathy, bilateral: Secondary | ICD-10-CM

## 2016-04-26 DIAGNOSIS — D3132 Benign neoplasm of left choroid: Secondary | ICD-10-CM | POA: Diagnosis not present

## 2016-04-26 DIAGNOSIS — E113512 Type 2 diabetes mellitus with proliferative diabetic retinopathy with macular edema, left eye: Secondary | ICD-10-CM | POA: Diagnosis not present

## 2016-04-26 DIAGNOSIS — E113311 Type 2 diabetes mellitus with moderate nonproliferative diabetic retinopathy with macular edema, right eye: Secondary | ICD-10-CM | POA: Diagnosis not present

## 2016-04-26 DIAGNOSIS — H2513 Age-related nuclear cataract, bilateral: Secondary | ICD-10-CM

## 2016-05-07 ENCOUNTER — Ambulatory Visit (INDEPENDENT_AMBULATORY_CARE_PROVIDER_SITE_OTHER): Payer: Commercial Managed Care - HMO | Admitting: Cardiovascular Disease

## 2016-05-07 ENCOUNTER — Encounter: Payer: Self-pay | Admitting: Cardiovascular Disease

## 2016-05-07 ENCOUNTER — Other Ambulatory Visit: Payer: Self-pay | Admitting: Pharmacist

## 2016-05-07 VITALS — BP 163/78 | HR 80 | Ht 73.0 in | Wt 268.0 lb

## 2016-05-07 DIAGNOSIS — I313 Pericardial effusion (noninflammatory): Secondary | ICD-10-CM

## 2016-05-07 DIAGNOSIS — R0989 Other specified symptoms and signs involving the circulatory and respiratory systems: Secondary | ICD-10-CM | POA: Diagnosis not present

## 2016-05-07 DIAGNOSIS — E785 Hyperlipidemia, unspecified: Secondary | ICD-10-CM

## 2016-05-07 DIAGNOSIS — R600 Localized edema: Secondary | ICD-10-CM

## 2016-05-07 DIAGNOSIS — I319 Disease of pericardium, unspecified: Secondary | ICD-10-CM

## 2016-05-07 DIAGNOSIS — I2510 Atherosclerotic heart disease of native coronary artery without angina pectoris: Secondary | ICD-10-CM

## 2016-05-07 DIAGNOSIS — Z79899 Other long term (current) drug therapy: Secondary | ICD-10-CM | POA: Diagnosis not present

## 2016-05-07 DIAGNOSIS — I10 Essential (primary) hypertension: Secondary | ICD-10-CM

## 2016-05-07 DIAGNOSIS — Z955 Presence of coronary angioplasty implant and graft: Secondary | ICD-10-CM

## 2016-05-07 DIAGNOSIS — I3139 Other pericardial effusion (noninflammatory): Secondary | ICD-10-CM

## 2016-05-07 DIAGNOSIS — I5032 Chronic diastolic (congestive) heart failure: Secondary | ICD-10-CM

## 2016-05-07 MED ORDER — EZETIMIBE 10 MG PO TABS: 10.0000 mg | ORAL_TABLET | Freq: Every day | ORAL | 3 refills | Status: DC

## 2016-05-07 NOTE — Patient Outreach (Signed)
Called to follow up with Mr. Sites to let him know that I called the [employer] patient assistance program today. Let him know that per representative Darnelle Maffucci, his application has been approved and the Levemir FlexTouch pen and Novofine pen needles will be shipped to the doctor's office and that these should be expected to arrive this week on Wednesday or Thursday. Left a HIPAA compliant message on the patient's voicemail.    Harlow Asa, PharmD Clinical Pharmacist [place] [facility] [phone]

## 2016-05-07 NOTE — Patient Instructions (Signed)
Your physician wants you to follow-up in: 6 months with Dr Virgina Jock will receive a reminder letter in the mail two months in advance. If you don't receive a letter, please call our office to schedule the follow-up appointment.   START Zetia 10 mg daily   Get FASTING lipids in 3 months    Thank you for choosing [facility] !

## 2016-05-07 NOTE — Progress Notes (Signed)
SUBJECTIVE: Eugene [name] presents for follow-up of chronic diastolic heart failure, malignant hypertension, and coronary artery disease.  Echocardiogram on 9 /29/16 demonstrated normal left ventricular systolic function and regional wall motion, LVEF 60-65%, severe LVH, grade 2 diastolic dysfunction, moderate left atrial and mild right atrial dilatation, dilated IVC , and a mild to moderate circumferential pericardial effusion with no evidence of tamponade physiology.  I reviewed blood pressure monitor readings and most readings have been within normal limits. He has stable chronic exertional dyspnea and has COPD. Denies chest pain. Swims several days a week at the [facility].   Review of Systems: As per "subjective", otherwise negative.  Allergies  Allergen Reactions  . Hydrocodone Nausea And Vomiting  . Lisinopril Cough  . Neurontin [Gabapentin] Other (See Comments)    Suicidal thoughts  . Statins Other (See Comments)    Muscle aches  . Metformin And Related Diarrhea and Other (See Comments)    Intestinal side effects  . Norvasc [Amlodipine Besylate] Swelling    Pedal edema    Current Outpatient Prescriptions  Medication Sig Dispense Refill  . acetaminophen (TYLENOL) 325 MG tablet Take 650 mg by mouth every 6 (six) hours as needed for mild pain.    [name] albuterol (PROVENTIL HFA;VENTOLIN HFA) 108 (90 BASE) MCG/ACT inhaler Inhale 2 puffs into the lungs every 4 (four) hours as needed for shortness of breath. 1 Inhaler 2  . allopurinol (ZYLOPRIM) 100 MG tablet Take 100 mg by mouth 2 (two) times daily as needed.    . ALPRAZolam (XANAX) 0.5 MG tablet TAKE 1/2-1 TABLET BY MOUTH AT BEDTIME AS NEEDED 30 tablet 4  . aspirin EC 81 MG tablet Take 81 mg by mouth at bedtime.     . clopidogrel (PLAVIX) 75 MG tablet TAKE 1 TABLET AT BEDTIME 90 tablet 1  . colchicine (COLCRYS) 0.6 MG tablet 2 now then one bid until relief 20 tablet 3  . esomeprazole (NEXIUM) 20 MG capsule Take 20 mg by mouth  daily as needed (for acid reflux).    . fenofibrate 160 MG tablet TAKE 1 TABLET AT BEDTIME 90 tablet 1  . hydrALAZINE (APRESOLINE) 50 MG tablet TAKE 1 TABLET TWICE DAILY 180 tablet 1  . LANTUS SOLOSTAR 100 UNIT/ML Solostar Pen Inject 80 Units as directed at bedtime.  4  . losartan (COZAAR) 50 MG tablet Take 1 tablet (50 mg total) by mouth daily. 90 tablet 1  . metoprolol tartrate (LOPRESSOR) 25 MG tablet TAKE 1 TABLET TWICE DAILY 180 tablet 3  . torsemide (DEMADEX) 20 MG tablet Take 10-20 mg by mouth daily. Alternated one tab with half a tab every other day    . traMADol (ULTRAM) 50 MG tablet TAKE 1 TABLET BY MOUTH EVERY 6 HOURS AS NEEDED FOR PAIN. 24 tablet 3   No current facility-administered medications for this visit.     Past Medical History:  Diagnosis Date  . Anginal pain ([place])   . Anxiety   . Arteriosclerotic cardiovascular disease (ASCVD)    a. 05/2011 Cath/PCI: LM nl, LAD 52m, D1 small, D2 small 6m, LCX large 40p, RCA 50-60p, 99 hazy @ origin of PDA with 70-80 in PDA (2.5x26 Resolute Integrity & 3.0x15 Resolute Integrity DES).;  b. 08/2012 Inflat  STEMI/Cath/PCI: LM minor irregs, LAD 50p, D1 50, LCX nl, OM1 25, RCA 30-40p, 100d (treated with 2.75x61mm Promus Premier DES);  c. 08/2012 Echo: EF 55-60%, basal inferopost HK.  . Asthma   . Bell palsy   . C.  difficile colitis    a. 08/2012  . CHF (congestive heart failure) ([place])   . Cholelithiasis 07/2012   Asymptomatic; identified incidentally  . Contrast dye induced nephropathy    a. 08/2012 post cath/pci  . COPD (chronic obstructive pulmonary disease) ([place])   . Diabetes mellitus    Peripheral neuropathy  . Gallstones   . GERD (gastroesophageal reflux disease)   . Heart disease   . Hyperlipidemia   . Hypertension   . Myocardial infarct ([place]) 09/08/12  . Nephrolithiasis   . Old myocardial infarction   . PONV (postoperative nausea and vomiting)   . Sleep apnea     Past Surgical History:  Procedure Laterality Date  .  CARDIAC CATHETERIZATION    . CHEST TUBE INSERTION    . CIRCUMCISION    . COLONOSCOPY     In [facility], approximately 2011 per patient, was normal. Advised to come back in 10 years.  . ESOPHAGOGASTRODUODENOSCOPY     [place] 20 yrs ago  . ESOPHAGOGASTRODUODENOSCOPY  06/12/2012   BOF:BPZWCH esophagus-status post Venia Minks dilation. Abnormal gastric mucosa of uncertain significance-status post biopsy  . INTRAOPERATIVE TRANSESOPHAGEAL ECHOCARDIOGRAM N/A 06/23/2013   Procedure: INTRAOPERATIVE TRANSESOPHAGEAL ECHOCARDIOGRAM;  Surgeon: Grace Isaac, MD;  Location: [facility];  Service: Open Heart Surgery;  Laterality: N/A;  . LEFT HEART CATHETERIZATION WITH CORONARY ANGIOGRAM N/A 09/08/2012   Procedure: LEFT HEART CATHETERIZATION WITH CORONARY ANGIOGRAM;  Surgeon: [name], MD;  Location[facility] CATH LAB;  Service: Cardiovascular;  Laterality: N/A;  . stents    . SUBXYPHOID PERICARDIAL WINDOW N/A 06/23/2013   Procedure: SUBXYPHOID PERICARDIAL WINDOW;  Surgeon: Grace Isaac, MD;  Location: [facility] OR;  Service: Thoracic;  Laterality: N/A;    Social History   Social History  . Marital status: Married    Spouse name: N/A  . Number of children: 2  . Years of education: N/A   Occupational History  . Shipping [employer]   Social History Main Topics  . Smoking status: Never Smoker  . Smokeless tobacco: Never Used  . Alcohol use No     Comment: heavy etoh use 30 years ago  . Drug use: No  . Sexual activity: Yes    Birth control/ protection: None   Other Topics Concern  . 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 Disabled at this point.     Vitals:   05/07/16 1533  BP: (!) 163/78  Pulse: 80  Weight: 268 lb (121.6 kg)  Height: 6\' 1"  (1.854 m)    PHYSICAL EXAM General: NAD HEENT: Normal. Neck: No JVD, no thyromegaly. Lungs: Clear to auscultation bilaterally with normal  respiratory effort. CV: Nondisplaced PMI. Regular rate and rhythm, normal S1/S2, no S3/S4, no murmur. Trace pretibial and periankle edema, L>R.  Abdomen: Firm, obese, nontender.  Neurologic: Alert and oriented x 3.  Psych: Normal affect. Skin: Normal.    ECG: Most recent ECG reviewed.      ASSESSMENT AND PLAN: 1. CAD s/p multiple PCI's to RCA: Stable ischemic heart disease. Continue aspirin, metoprolol, and Plavix. Not amenable to statin therapy. Start Zetia for elevated TC and LDL. May consider Repatha in future.  2. Malignant HTN: Elevated today but reasonably controlled at home. Continue present therapy.  3. Chronic diastolic heart failure: Euvolemic. Continue low-sodium and fluid-restricted diet. Continue compression stocking use. BP control of prime importance.  4. Mixed dyslipidemia: Intolerant to  statins (muscle aches). On fenofibrate. Start Zetia 10 mg daily and repeat lipids in 3 months. Would also consider Repatha. TC 208, TG 255, HDL 34, LDL 123 03/22/16.   5. Pericardial effusion s/p pericardial window: Small to medium in size and stable in 04/2015. Continue to monitor.   6. Right carotid bruit: No stenosis by [facility] 10/2015.   Dispo: f/u 6 months.   Kate Sable, M.D., F.A.C.C.

## 2016-05-07 NOTE — Patient Outreach (Addendum)
Called to follow up with [employer] patient assistance program regarding status of Mr. Fry's patient assistance application, which I faxed on 04/26/16. Speak with representative Darnelle Maffucci who reports that the patient's application has not currently been approved because further evidence is needed of his out-of-pocket expenses. Review the paperwork with Darnelle Maffucci to explain the patient's sources to meet the out-of-pocket requirement. Darnelle Maffucci reports that the application for assistance is now approved. Darnelle Maffucci reports that the Levemir FlexTouch pen and Novofine pen needles will be shipped to the doctor's office and that these should be expected to arrive this week on Wednesday or Thursday.  Harlow Asa, PharmD Clinical Pharmacist [place] [facility] [phone]

## 2016-05-08 LAB — BASIC METABOLIC PANEL
BUN/Creatinine Ratio: 22 — ABNORMAL HIGH (ref 9–20)
BUN: 35 mg/dL — ABNORMAL HIGH (ref 6–24)
CALCIUM: 8.9 mg/dL (ref 8.7–10.2)
CHLORIDE: 100 mmol/L (ref 96–106)
CO2: 20 mmol/L (ref 18–29)
Creatinine, Ser: 1.61 mg/dL — ABNORMAL HIGH (ref 0.76–1.27)
GFR, EST AFRICAN AMERICAN: 53 mL/min/{1.73_m2} — AB (ref >59)
GFR, EST NON AFRICAN AMERICAN: 46 mL/min/{1.73_m2} — AB (ref >59)
Glucose: 324 mg/dL — ABNORMAL HIGH (ref 65–99)
POTASSIUM: 5.7 mmol/L — AB (ref 3.5–5.2)
SODIUM: 135 mmol/L (ref 134–144)

## 2016-05-09 ENCOUNTER — Other Ambulatory Visit: Payer: Self-pay | Admitting: Pharmacist

## 2016-05-09 ENCOUNTER — Ambulatory Visit: Payer: Self-pay | Admitting: *Deleted

## 2016-05-09 NOTE — Patient Outreach (Signed)
Called to follow up with [name] to let him know that I called the [place] patient assistance program on Monday. Let him know that per representative Darnelle Maffucci, his application has been approved and the Levemir FlexTouch pen and Novofine pen needles will be shipped to the doctor's office and that these should be expected to arrive this week on Wednesday or Thursday.   Patient expresses appreciation for my help. Reports that his blood sugars have been down and that he has started going back to the gym. Reports that he will see Dr. [name] for an appointment this Friday.  Patient confirms that he has my phone number for future medication questions or concerns. Will close pharmacy episode at this time.  Harlow Asa, PharmD Clinical Pharmacist [facility] [phone]

## 2016-05-10 ENCOUNTER — Telehealth: Payer: Self-pay | Admitting: Family Medicine

## 2016-05-10 ENCOUNTER — Other Ambulatory Visit: Payer: Self-pay | Admitting: *Deleted

## 2016-05-10 ENCOUNTER — Encounter: Payer: Self-pay | Admitting: *Deleted

## 2016-05-10 NOTE — Patient Outreach (Signed)
[facility]) Care Management  05/10/2016  [place] 1956/02/01 [id number]  [name] is a 60 year old gentleman with history including coronary artery disease s/p multiple PCI's to RCA with stable ischemic heart disease, malignant hypertension, chronic diastolic heart failure, and mixed dyslipidemia.   [name] was referred to [facility] several months ago for assistance with HTN management and medication management. [name] last saw Dr. Bronson Ing, his cardiologist, in the office on 05/07/16 when he received a good report regarding the stability of his chronic cardiac diseases. He was started on Zetia for elevated TC and LDL. [name] last saw his primary care provider, Dr. [name] on 03/01/16 and is scheduled for routine follow up today 05/11/16.   [name] has been self monitoring his blood pressure at home and is well aware of prescribed parameters and when to call his provider for findings outside established parameters. He is taking medications as prescribed and adhering to his prescribed diet at least 50% of the time. [name] has began participating in his old exercise regiment as well.   Over the last months, [name] has been provided assistance by our pharmacy team with the [employer] patient assistance program and has been qualified/approved for assistance.   Plan: Case Closure. I have provided [name] with my contact information should he be in need of community case management services in the future.    [facility]  [phone]

## 2016-05-10 NOTE — Telephone Encounter (Signed)
Notified patient his insulin is ready for pick up from [employer].

## 2016-05-11 ENCOUNTER — Encounter: Payer: Self-pay | Admitting: *Deleted

## 2016-05-11 ENCOUNTER — Ambulatory Visit (INDEPENDENT_AMBULATORY_CARE_PROVIDER_SITE_OTHER): Payer: Commercial Managed Care - HMO | Admitting: Family Medicine

## 2016-05-11 ENCOUNTER — Encounter: Payer: Self-pay | Admitting: Family Medicine

## 2016-05-11 VITALS — BP 132/84 | Ht 73.0 in | Wt 267.0 lb

## 2016-05-11 DIAGNOSIS — E875 Hyperkalemia: Secondary | ICD-10-CM

## 2016-05-11 DIAGNOSIS — Z23 Encounter for immunization: Secondary | ICD-10-CM

## 2016-05-11 NOTE — Progress Notes (Signed)
   Subjective:    Patient ID: Eugene [name], male    DOB: [birth year], 60 y.o.   MRN: [id number]  HPI    Review of Systems     Objective:   Physical Exam        Assessment & Plan:

## 2016-05-11 NOTE — Progress Notes (Signed)
   Subjective:    Patient ID: Eugene [name], male    DOB: September 20, 1955, 61 y.o.   MRN: [id number]  HPIFollow up on blood work results and elevation of potassium.  Patient has history of heart failure is on multiple medicines he is cut out potassium foods but his potassium still high there was concern it could be medication related but then patient remembered that he started drinking electrolyte solutions several weeks ago because he liked the taste so therefore he is going to cut this out. Pt was put  On zetia by cardiologist. Pt states he cannot afford med. It is over $100.  His cardiologist put him on this medicine he cannot afford it. They are considering the possibility of a medication called Repatha all of these higher tech medicines tend cost excessive amount of money but the patient does have significant heart disease and cholesterol issues   Review of Systems  Constitutional: Negative for activity change, fatigue and fever.  Respiratory: Negative for cough and shortness of breath.   Cardiovascular: Negative for chest pain and leg swelling.  Neurological: Negative for headaches.       Objective:   Physical Exam  Constitutional: He appears well-nourished. No distress.  Cardiovascular: Normal rate, regular rhythm and normal heart sounds.   No murmur heard. Pulmonary/Chest: Effort normal and breath sounds normal. No respiratory distress.  Musculoskeletal: He exhibits no edema.  Lymphadenopathy:    He has no cervical adenopathy.  Neurological: He is alert.  Psychiatric: His behavior is normal.  Vitals reviewed.         Assessment & Plan:  Hyperlipidemia-cost of medication significant issue patient will try to get Zetia he will talk with his social worker who is with the insurance company to see if there is anyway they can help him with cost of this medicine  Patients diabetes seems to be under good control continue current measures.  Hyperkalemia probably related to the  fact he is drinking electrolyte replacement solutions which typically are high in potassium he will cut this out he'll recheck his metabolic 7 within 2 weeks  Follow-up in approximate 3 months sooner problems

## 2016-05-21 DIAGNOSIS — E875 Hyperkalemia: Secondary | ICD-10-CM | POA: Diagnosis not present

## 2016-05-22 LAB — BASIC METABOLIC PANEL WITH GFR
BUN/Creatinine Ratio: 21 — ABNORMAL HIGH (ref 9–20)
BUN: 36 mg/dL — ABNORMAL HIGH (ref 6–24)
CO2: 21 mmol/L (ref 18–29)
Calcium: 8.8 mg/dL (ref 8.7–10.2)
Chloride: 101 mmol/L (ref 96–106)
Creatinine, Ser: 1.74 mg/dL — ABNORMAL HIGH (ref 0.76–1.27)
GFR calc Af Amer: 49 mL/min/{1.73_m2} — ABNORMAL LOW
GFR calc non Af Amer: 42 mL/min/{1.73_m2} — ABNORMAL LOW
Glucose: 249 mg/dL — ABNORMAL HIGH (ref 65–99)
Potassium: 5.6 mmol/L — ABNORMAL HIGH (ref 3.5–5.2)
Sodium: 137 mmol/L (ref 134–144)

## 2016-05-22 MED ORDER — LOSARTAN POTASSIUM 50 MG PO TABS: 25.0000 mg | ORAL_TABLET | Freq: Every day | ORAL | 0 refills | Status: DC

## 2016-05-22 NOTE — Addendum Note (Signed)
Addended by: Dairl Ponder on: 05/22/2016 10:47 AM   Modules accepted: Orders

## 2016-05-22 NOTE — Addendum Note (Signed)
Addended by: Dairl Ponder on: 05/22/2016 10:48 AM   Modules accepted: Orders

## 2016-05-23 DIAGNOSIS — H2512 Age-related nuclear cataract, left eye: Secondary | ICD-10-CM | POA: Diagnosis not present

## 2016-05-23 DIAGNOSIS — H2513 Age-related nuclear cataract, bilateral: Secondary | ICD-10-CM | POA: Diagnosis not present

## 2016-05-23 DIAGNOSIS — E113313 Type 2 diabetes mellitus with moderate nonproliferative diabetic retinopathy with macular edema, bilateral: Secondary | ICD-10-CM | POA: Diagnosis not present

## 2016-05-23 DIAGNOSIS — H25013 Cortical age-related cataract, bilateral: Secondary | ICD-10-CM | POA: Diagnosis not present

## 2016-05-23 DIAGNOSIS — H35032 Hypertensive retinopathy, left eye: Secondary | ICD-10-CM | POA: Diagnosis not present

## 2016-05-23 DIAGNOSIS — H35031 Hypertensive retinopathy, right eye: Secondary | ICD-10-CM | POA: Diagnosis not present

## 2016-05-23 DIAGNOSIS — E113311 Type 2 diabetes mellitus with moderate nonproliferative diabetic retinopathy with macular edema, right eye: Secondary | ICD-10-CM | POA: Diagnosis not present

## 2016-05-23 DIAGNOSIS — H35033 Hypertensive retinopathy, bilateral: Secondary | ICD-10-CM | POA: Diagnosis not present

## 2016-05-23 DIAGNOSIS — H25012 Cortical age-related cataract, left eye: Secondary | ICD-10-CM | POA: Diagnosis not present

## 2016-05-23 DIAGNOSIS — E113512 Type 2 diabetes mellitus with proliferative diabetic retinopathy with macular edema, left eye: Secondary | ICD-10-CM | POA: Diagnosis not present

## 2016-06-04 DIAGNOSIS — E875 Hyperkalemia: Secondary | ICD-10-CM | POA: Diagnosis not present

## 2016-06-05 LAB — BASIC METABOLIC PANEL
BUN/Creatinine Ratio: 21 — ABNORMAL HIGH (ref 9–20)
BUN: 40 mg/dL — ABNORMAL HIGH (ref 6–24)
CALCIUM: 9.3 mg/dL (ref 8.7–10.2)
CHLORIDE: 100 mmol/L (ref 96–106)
CO2: 20 mmol/L (ref 18–29)
Creatinine, Ser: 1.93 mg/dL — ABNORMAL HIGH (ref 0.76–1.27)
GFR calc Af Amer: 43 mL/min/{1.73_m2} — ABNORMAL LOW (ref >59)
GFR calc non Af Amer: 37 mL/min/{1.73_m2} — ABNORMAL LOW (ref >59)
GLUCOSE: 230 mg/dL — AB (ref 65–99)
POTASSIUM: 6.1 mmol/L — AB (ref 3.5–5.2)
Sodium: 136 mmol/L (ref 134–144)

## 2016-06-06 ENCOUNTER — Other Ambulatory Visit: Payer: Self-pay

## 2016-06-06 DIAGNOSIS — Z79899 Other long term (current) drug therapy: Secondary | ICD-10-CM

## 2016-06-07 ENCOUNTER — Encounter (INDEPENDENT_AMBULATORY_CARE_PROVIDER_SITE_OTHER): Payer: Commercial Managed Care - HMO | Admitting: Ophthalmology

## 2016-06-07 DIAGNOSIS — E11311 Type 2 diabetes mellitus with unspecified diabetic retinopathy with macular edema: Secondary | ICD-10-CM | POA: Diagnosis not present

## 2016-06-07 DIAGNOSIS — E113512 Type 2 diabetes mellitus with proliferative diabetic retinopathy with macular edema, left eye: Secondary | ICD-10-CM | POA: Diagnosis not present

## 2016-06-07 DIAGNOSIS — H35033 Hypertensive retinopathy, bilateral: Secondary | ICD-10-CM

## 2016-06-07 DIAGNOSIS — H43813 Vitreous degeneration, bilateral: Secondary | ICD-10-CM | POA: Diagnosis not present

## 2016-06-07 DIAGNOSIS — I10 Essential (primary) hypertension: Secondary | ICD-10-CM

## 2016-06-07 DIAGNOSIS — D3132 Benign neoplasm of left choroid: Secondary | ICD-10-CM

## 2016-06-07 DIAGNOSIS — E113311 Type 2 diabetes mellitus with moderate nonproliferative diabetic retinopathy with macular edema, right eye: Secondary | ICD-10-CM | POA: Diagnosis not present

## 2016-06-14 ENCOUNTER — Inpatient Hospital Stay (HOSPITAL_COMMUNITY)
Admission: EM | Admit: 2016-06-14 | Discharge: 2016-06-15 | DRG: 305 | Disposition: A | Discharge status: Home / Self Care | Payer: Commercial Managed Care - HMO | Attending: Cardiology | Admitting: Cardiology

## 2016-06-14 ENCOUNTER — Ambulatory Visit (INDEPENDENT_AMBULATORY_CARE_PROVIDER_SITE_OTHER): Payer: Commercial Managed Care - HMO | Admitting: Family Medicine

## 2016-06-14 ENCOUNTER — Encounter (HOSPITAL_COMMUNITY): Payer: Self-pay | Admitting: Emergency Medicine

## 2016-06-14 ENCOUNTER — Other Ambulatory Visit (HOSPITAL_COMMUNITY)
Admission: RE | Admit: 2016-06-14 | Discharge: 2016-06-14 | Disposition: A | Discharge status: Home / Self Care | Payer: Commercial Managed Care - HMO | Source: Ambulatory Visit | Attending: Family Medicine | Admitting: Family Medicine

## 2016-06-14 ENCOUNTER — Encounter: Payer: Self-pay | Admitting: Family Medicine

## 2016-06-14 ENCOUNTER — Other Ambulatory Visit: Payer: Self-pay

## 2016-06-14 VITALS — BP 130/80 | Ht 73.0 in | Wt 260.5 lb

## 2016-06-14 DIAGNOSIS — R079 Chest pain, unspecified: Secondary | ICD-10-CM | POA: Diagnosis present

## 2016-06-14 DIAGNOSIS — Z7982 Long term (current) use of aspirin: Secondary | ICD-10-CM | POA: Diagnosis not present

## 2016-06-14 DIAGNOSIS — E119 Type 2 diabetes mellitus without complications: Secondary | ICD-10-CM | POA: Diagnosis not present

## 2016-06-14 DIAGNOSIS — E1122 Type 2 diabetes mellitus with diabetic chronic kidney disease: Secondary | ICD-10-CM | POA: Diagnosis not present

## 2016-06-14 DIAGNOSIS — R0789 Other chest pain: Secondary | ICD-10-CM | POA: Diagnosis not present

## 2016-06-14 DIAGNOSIS — I248 Other forms of acute ischemic heart disease: Secondary | ICD-10-CM | POA: Diagnosis present

## 2016-06-14 DIAGNOSIS — I2510 Atherosclerotic heart disease of native coronary artery without angina pectoris: Secondary | ICD-10-CM | POA: Diagnosis present

## 2016-06-14 DIAGNOSIS — N183 Chronic kidney disease, stage 3 unspecified: Secondary | ICD-10-CM

## 2016-06-14 DIAGNOSIS — I130 Hypertensive heart and chronic kidney disease with heart failure and stage 1 through stage 4 chronic kidney disease, or unspecified chronic kidney disease: Secondary | ICD-10-CM | POA: Diagnosis present

## 2016-06-14 DIAGNOSIS — Z794 Long term (current) use of insulin: Secondary | ICD-10-CM

## 2016-06-14 DIAGNOSIS — Z823 Family history of stroke: Secondary | ICD-10-CM | POA: Diagnosis not present

## 2016-06-14 DIAGNOSIS — Z833 Family history of diabetes mellitus: Secondary | ICD-10-CM

## 2016-06-14 DIAGNOSIS — Z6834 Body mass index (BMI) 34.0-34.9, adult: Secondary | ICD-10-CM

## 2016-06-14 DIAGNOSIS — I10 Essential (primary) hypertension: Secondary | ICD-10-CM | POA: Diagnosis not present

## 2016-06-14 DIAGNOSIS — I252 Old myocardial infarction: Secondary | ICD-10-CM

## 2016-06-14 DIAGNOSIS — Z955 Presence of coronary angioplasty implant and graft: Secondary | ICD-10-CM

## 2016-06-14 DIAGNOSIS — I5032 Chronic diastolic (congestive) heart failure: Secondary | ICD-10-CM | POA: Diagnosis present

## 2016-06-14 DIAGNOSIS — Z9861 Coronary angioplasty status: Secondary | ICD-10-CM | POA: Diagnosis present

## 2016-06-14 DIAGNOSIS — E785 Hyperlipidemia, unspecified: Secondary | ICD-10-CM | POA: Diagnosis present

## 2016-06-14 DIAGNOSIS — E7800 Pure hypercholesterolemia, unspecified: Secondary | ICD-10-CM | POA: Diagnosis not present

## 2016-06-14 DIAGNOSIS — Z7902 Long term (current) use of antithrombotics/antiplatelets: Secondary | ICD-10-CM | POA: Diagnosis not present

## 2016-06-14 DIAGNOSIS — E875 Hyperkalemia: Secondary | ICD-10-CM

## 2016-06-14 DIAGNOSIS — J449 Chronic obstructive pulmonary disease, unspecified: Secondary | ICD-10-CM | POA: Diagnosis present

## 2016-06-14 DIAGNOSIS — Z79899 Other long term (current) drug therapy: Secondary | ICD-10-CM | POA: Diagnosis not present

## 2016-06-14 DIAGNOSIS — Z8249 Family history of ischemic heart disease and other diseases of the circulatory system: Secondary | ICD-10-CM | POA: Diagnosis not present

## 2016-06-14 DIAGNOSIS — I214 Non-ST elevation (NSTEMI) myocardial infarction: Secondary | ICD-10-CM

## 2016-06-14 DIAGNOSIS — I160 Hypertensive urgency: Principal | ICD-10-CM | POA: Diagnosis present

## 2016-06-14 LAB — POCT GLYCOSYLATED HEMOGLOBIN (HGB A1C): Hemoglobin A1C: 7.9

## 2016-06-14 LAB — BASIC METABOLIC PANEL
Anion gap: 8 (ref 5–15)
BUN: 32 mg/dL — ABNORMAL HIGH (ref 6–20)
CALCIUM: 9.3 mg/dL (ref 8.9–10.3)
CO2: 23 mmol/L (ref 22–32)
CREATININE: 1.61 mg/dL — AB (ref 0.61–1.24)
Chloride: 104 mmol/L (ref 101–111)
GFR, EST AFRICAN AMERICAN: 52 mL/min — AB (ref >60)
GFR, EST NON AFRICAN AMERICAN: 45 mL/min — AB (ref >60)
Glucose, Bld: 203 mg/dL — ABNORMAL HIGH (ref 65–99)
Potassium: 4.8 mmol/L (ref 3.5–5.1)
SODIUM: 135 mmol/L (ref 135–145)

## 2016-06-14 LAB — PROTIME-INR
INR: 1
PROTHROMBIN TIME: 13.2 s (ref 11.4–15.2)

## 2016-06-14 LAB — TROPONIN I
TROPONIN I: 0.11 ng/mL — AB (ref <0.03)
Troponin I: 0.11 ng/mL (ref <0.03)

## 2016-06-14 LAB — GLUCOSE, CAPILLARY: GLUCOSE-CAPILLARY: 158 mg/dL — AB (ref 65–99)

## 2016-06-14 MED ORDER — SODIUM CHLORIDE 0.9% FLUSH: 3.0000 mL | INTRAVENOUS | Status: DC | PRN

## 2016-06-14 MED ORDER — ISOSORBIDE MONONITRATE ER 30 MG PO TB24
30.0000 mg | ORAL_TABLET | Freq: Every day | ORAL | Status: DC
  Administered 2016-06-15: 30 mg via ORAL
  Filled 2016-06-14 (×2): qty 1

## 2016-06-14 MED ORDER — CLOPIDOGREL BISULFATE 75 MG PO TABS
75.0000 mg | ORAL_TABLET | Freq: Every day | ORAL | Status: DC
  Administered 2016-06-14: 75 mg via ORAL
  Filled 2016-06-14: qty 1

## 2016-06-14 MED ORDER — ALBUTEROL SULFATE (2.5 MG/3ML) 0.083% IN NEBU: 3.0000 mL | INHALATION_SOLUTION | RESPIRATORY_TRACT | Status: DC | PRN

## 2016-06-14 MED ORDER — ACETAMINOPHEN 325 MG PO TABS: 650.0000 mg | ORAL_TABLET | ORAL | Status: DC | PRN

## 2016-06-14 MED ORDER — SODIUM CHLORIDE 0.9% FLUSH
3.0000 mL | Freq: Two times a day (BID) | INTRAVENOUS | Status: DC
  Administered 2016-06-15: 3 mL via INTRAVENOUS

## 2016-06-14 MED ORDER — SODIUM CHLORIDE 0.9 % IV SOLN
INTRAVENOUS | Status: DC
  Administered 2016-06-14: 23:00:00 via INTRAVENOUS

## 2016-06-14 MED ORDER — SODIUM CHLORIDE 0.9 % IV SOLN: 250.0000 mL | INTRAVENOUS | Status: DC | PRN

## 2016-06-14 MED ORDER — METOPROLOL TARTRATE 50 MG PO TABS
50.0000 mg | ORAL_TABLET | Freq: Two times a day (BID) | ORAL | Status: DC
  Administered 2016-06-14 – 2016-06-15 (×2): 50 mg via ORAL
  Filled 2016-06-14 (×2): qty 1

## 2016-06-14 MED ORDER — ONDANSETRON HCL 4 MG/2ML IJ SOLN: 4.0000 mg | Freq: Four times a day (QID) | INTRAMUSCULAR | Status: DC | PRN

## 2016-06-14 MED ORDER — INSULIN ASPART 100 UNIT/ML ~~LOC~~ SOLN
0.0000 [IU] | Freq: Three times a day (TID) | SUBCUTANEOUS | Status: DC
  Administered 2016-06-15: 3 [IU] via SUBCUTANEOUS
  Administered 2016-06-15: 5 [IU] via SUBCUTANEOUS

## 2016-06-14 MED ORDER — NITROGLYCERIN 0.4 MG SL SUBL: 0.4000 mg | SUBLINGUAL_TABLET | SUBLINGUAL | Status: DC | PRN

## 2016-06-14 MED ORDER — EZETIMIBE 10 MG PO TABS
10.0000 mg | ORAL_TABLET | Freq: Every day | ORAL | Status: DC
  Administered 2016-06-15: 10 mg via ORAL
  Filled 2016-06-14 (×2): qty 1

## 2016-06-14 MED ORDER — ASPIRIN 81 MG PO CHEW: 324.0000 mg | CHEWABLE_TABLET | ORAL | Status: DC

## 2016-06-14 MED ORDER — HEPARIN BOLUS VIA INFUSION
4000.0000 [IU] | Freq: Once | INTRAVENOUS | Status: AC
  Administered 2016-06-14: 4000 [IU] via INTRAVENOUS
  Filled 2016-06-14: qty 4000

## 2016-06-14 MED ORDER — TRAMADOL HCL 50 MG PO TABS: 50.0000 mg | ORAL_TABLET | Freq: Four times a day (QID) | ORAL | Status: DC | PRN

## 2016-06-14 MED ORDER — INSULIN GLARGINE 100 UNIT/ML ~~LOC~~ SOLN
40.0000 [IU] | Freq: Every day | SUBCUTANEOUS | Status: DC
  Administered 2016-06-14: 40 [IU] via SUBCUTANEOUS
  Filled 2016-06-14 (×2): qty 0.4

## 2016-06-14 MED ORDER — HYDRALAZINE HCL 50 MG PO TABS
50.0000 mg | ORAL_TABLET | Freq: Two times a day (BID) | ORAL | Status: DC
  Administered 2016-06-14 – 2016-06-15 (×2): 50 mg via ORAL
  Filled 2016-06-14 (×2): qty 1

## 2016-06-14 MED ORDER — ASPIRIN 300 MG RE SUPP
300.0000 mg | RECTAL | Status: DC
  Filled 2016-06-14: qty 1

## 2016-06-14 MED ORDER — ASPIRIN EC 81 MG PO TBEC
81.0000 mg | DELAYED_RELEASE_TABLET | Freq: Every day | ORAL | Status: DC
  Administered 2016-06-15: 81 mg via ORAL
  Filled 2016-06-14 (×2): qty 1

## 2016-06-14 MED ORDER — HEPARIN (PORCINE) IN NACL 100-0.45 UNIT/ML-% IJ SOLN
1700.0000 [IU]/h | INTRAMUSCULAR | Status: DC
  Administered 2016-06-14: 1300 [IU]/h via INTRAVENOUS
  Administered 2016-06-15: 1700 [IU]/h via INTRAVENOUS
  Filled 2016-06-14 (×2): qty 250

## 2016-06-14 MED ORDER — ALPRAZOLAM 0.5 MG PO TABS: 0.5000 mg | ORAL_TABLET | Freq: Every evening | ORAL | Status: DC | PRN

## 2016-06-14 MED ORDER — FENOFIBRATE 160 MG PO TABS
160.0000 mg | ORAL_TABLET | Freq: Every day | ORAL | Status: DC
  Administered 2016-06-14: 160 mg via ORAL
  Filled 2016-06-14: qty 1

## 2016-06-14 NOTE — ED Triage Notes (Signed)
Pt from [facility], sent there by PCP for chest pain, ekg shows elevation and trop was elevated at [facility], called code stemi. Pt given 1 nitro 324 asa by ems and is pain free upon arival to ER. PT in NAD. Hypertensive. Hx of MI and stents.

## 2016-06-14 NOTE — Progress Notes (Signed)
ANTICOAGULATION CONSULT NOTE - Initial Consult  Pharmacy Consult for Heparin Indication: chest pain/ACS  Allergies  Allergen Reactions  . Hydrocodone Nausea And Vomiting  . Lisinopril Cough  . Neurontin [Gabapentin] Other (See Comments)    Suicidal thoughts  . Statins Other (See Comments)    Muscle aches  . Metformin And Related Diarrhea and Other (See Comments)    Intestinal side effects  . Norvasc [Amlodipine Besylate] Swelling    Pedal edema   Patient Measurements:   Heparin Dosing Weight: 105.4 kg  Vital Signs: Temp: 98.9 F (37.2 C) (10/19 1710) Temp Source: Oral (10/19 1710) BP: 185/83 (10/19 1715) Pulse Rate: 77 (10/19 1715)  Labs:  Recent Labs  06/14/16 1435  CREATININE 1.61*  TROPONINI 0.11*    Estimated Creatinine Clearance: 66.5 mL/min (by C-G formula based on SCr of 1.61 mg/dL (H)).   Medical History: Past Medical History:  Diagnosis Date  . Anginal pain ([place])   . Anxiety   . Arteriosclerotic cardiovascular disease (ASCVD)    a. 05/2011 Cath/PCI: LM nl, LAD 77m, D1 small, D2 small 49m, LCX large 40p, RCA 50-60p, 99 hazy @ origin of PDA with 70-80 in PDA (2.5x26 Resolute Integrity & 3.0x15 Resolute Integrity DES).;  b. 08/2012 Inflat  STEMI/Cath/PCI: LM minor irregs, LAD 50p, D1 50, LCX nl, OM1 25, RCA 30-40p, 100d (treated with 2.75x42mm Promus Premier DES);  c. 08/2012 Echo: EF 55-60%, basal inferopost HK.  . Asthma   . Bell palsy   . C. difficile colitis    a. 08/2012  . CHF (congestive heart failure) ([place])   . Cholelithiasis 07/2012   Asymptomatic; identified incidentally  . Contrast dye induced nephropathy    a. 08/2012 post cath/pci  . COPD (chronic obstructive pulmonary disease) ([place])   . Diabetes mellitus    Peripheral neuropathy  . Gallstones   . GERD (gastroesophageal reflux disease)   . Heart disease   . Hyperlipidemia   . Hypertension   . Myocardial infarct 09/08/12  . Nephrolithiasis   . Old myocardial infarction   . PONV  (postoperative nausea and vomiting)   . Sleep apnea     Medications:   (Not in a hospital admission)  Assessment: 60 yo pt from [facility],  for chest pain, ekg shows elevation and trop was elevated at [facility], called code stemi. Pharmacy has been consulted for heparin. No anticoagulation PTA.   Goal of Therapy:  Heparin level 0.3-0.7 units/ml Monitor platelets by anticoagulation protocol: Yes   Plan:  Give 4000 units bolus x 1, then heparin 1300 units/ hour 6 hour heparin level Daily CBC and heparin level  Jordyn Hofacker L Ermel Verne 06/14/2016,5:38 PM

## 2016-06-14 NOTE — H&P (Addendum)
Admit date: 06/14/2016 Primary Physician  [name], MD Primary Cardiologist  Dr. Jacinta Shoe  CC: Chest pain  HPI: 60 year old male with history of coronary artery disease status post RCA stents the past with severe left ventricular hypertrophy, ejection fraction 65% on echocardiogram 04/2015 with prior mild to moderate circumferential pericardial effusion, diabetes, statin intolerance who presented to his primary care doctor's office today with a one-day history of chest discomfort. This is not usual for him. It radiated to his back. No emesis, no fevers, no syncope, no bleeding. He took a Nexium to see if this helped but it did not. He usually does not have GERD. Enjoys spicy food. His prior heart attack was "silent". He has had 2 stents placed in his right coronary artery because the first stent "failed".  Troponin was drawn and was mildly elevated at 0.11 and therefore he was sent to [facility] emergency room for further evaluation. Blood pressure has been ranging from 537 systolic to 482 systolic. His hemoglobin A1c is 7.9. He has had his chest discomfort described as a tightness radiating to right shoulder blade over 24 hours. It has waxed and waned throughout the night. No diaphoresis, no emesis.   Had hyperkalemia on losartan which was stopped. Has 1-2+ edema lower extremity which is normal for him.  Originally, there were some concern that this was an ST elevation myocardial infarction but this was called off after comparison EKG looked very similar.  In review of prior office notes, he's had multiple PCI to his right coronary artery and has had fairly stable ischemic heart disease area he has been on Plavix, aspirin, metoprolol but is statin intolerance and Zetia was started. He's had difficulty with malignant hypertension in the past. He has also had a pericardial window with a stable pericardial effusion from echocardiogram in 2016 as above. Right carotid bruit showed no evidence of  stenosis by Doppler's on 10/2015.  Here in the emergency department, heparin IV was started because of his elevated troponin and recent history of chest pain.    PMH:   Past Medical History:  Diagnosis Date  . Anginal pain ([place])   . Anxiety   . Arteriosclerotic cardiovascular disease (ASCVD)    a. 05/2011 Cath/PCI: LM nl, LAD 64m, D1 small, D2 small 35m, LCX large 40p, RCA 50-60p, 99 hazy @ origin of PDA with 70-80 in PDA (2.5x26 Resolute Integrity & 3.0x15 Resolute Integrity DES).;  b. 08/2012 Inflat  STEMI/Cath/PCI: LM minor irregs, LAD 50p, D1 50, LCX nl, OM1 25, RCA 30-40p, 100d (treated with 2.75x40mm Promus Premier DES);  c. 08/2012 Echo: EF 55-60%, basal inferopost HK.  . Asthma   . Bell palsy   . C. difficile colitis    a. 08/2012  . CHF (congestive heart failure) ([place])   . Cholelithiasis 07/2012   Asymptomatic; identified incidentally  . Contrast dye induced nephropathy    a. 08/2012 post cath/pci  . COPD (chronic obstructive pulmonary disease) ([place])   . Diabetes mellitus    Peripheral neuropathy  . Gallstones   . GERD (gastroesophageal reflux disease)   . Heart disease   . Hyperlipidemia   . Hypertension   . Myocardial infarct 09/08/12  . Nephrolithiasis   . Old myocardial infarction   . PONV (postoperative nausea and vomiting)   . Sleep apnea     PSH:   Past Surgical History:  Procedure Laterality Date  . CARDIAC CATHETERIZATION    . CHEST TUBE INSERTION    . CIRCUMCISION    .  COLONOSCOPY     In [facility], approximately 2011 per patient, was normal. Advised to come back in 10 years.  . ESOPHAGOGASTRODUODENOSCOPY     in [place] over 20 yrs ago  . ESOPHAGOGASTRODUODENOSCOPY  06/12/2012   UKG:URKYHC esophagus-status post Venia Minks dilation. Abnormal gastric mucosa of uncertain significance-status post biopsy  . INTRAOPERATIVE TRANSESOPHAGEAL ECHOCARDIOGRAM N/A 06/23/2013   Procedure: INTRAOPERATIVE TRANSESOPHAGEAL ECHOCARDIOGRAM;  Surgeon: Grace Isaac, MD;  Location: [place];  Service: Open Heart Surgery;  Laterality: N/A;  . LEFT HEART CATHETERIZATION WITH CORONARY ANGIOGRAM N/A 09/08/2012   Procedure: LEFT HEART CATHETERIZATION WITH CORONARY ANGIOGRAM;  Surgeon: [name], MD;  Location: [facility] CATH LAB;  Service: Cardiovascular;  Laterality: N/A;  . stents    . SUBXYPHOID PERICARDIAL WINDOW N/A 06/23/2013   Procedure: SUBXYPHOID PERICARDIAL WINDOW;  Surgeon: Grace Isaac, MD;  Location: [facility] OR;  Service: Thoracic;  Laterality: N/A;   Allergies:  Hydrocodone; Lisinopril; Neurontin [gabapentin]; Statins; Metformin and related; and Norvasc [amlodipine besylate] Prior to Admit Meds:   Prior to Admission medications   Medication Sig Start Date End Date Taking? Authorizing Provider  acetaminophen (TYLENOL) 325 MG tablet Take 650 mg by mouth every 6 (six) hours as needed for mild pain.    Historical Provider, MD  albuterol (PROVENTIL HFA;VENTOLIN HFA) 108 (90 BASE) MCG/ACT inhaler Inhale 2 puffs into the lungs every 4 (four) hours as needed for shortness of breath. 09/28/14   [name], MD  allopurinol (ZYLOPRIM) 100 MG tablet Take 100 mg by mouth 2 (two) times daily as needed.    Historical Provider, MD  ALPRAZolam Duanne Moron) 0.5 MG tablet TAKE 1/2-1 TABLET BY MOUTH AT BEDTIME AS NEEDED 04/26/16   [name], MD  aspirin EC 81 MG tablet Take 81 mg by mouth at bedtime.     Historical Provider, MD  clopidogrel (PLAVIX) 75 MG tablet TAKE 1 TABLET AT BEDTIME 04/09/16   [name], MD  colchicine (COLCRYS) 0.6 MG tablet 2 now then one bid until relief 07/04/15   [name], MD  esomeprazole (NEXIUM) 20 MG capsule Take 20 mg by mouth daily as needed (for acid reflux).    Historical Provider, MD  ezetimibe (ZETIA) 10 MG tablet Take 1 tablet (10 mg total) by mouth daily. 05/07/16 08/05/16  Herminio Commons, MD  fenofibrate 160 MG tablet TAKE 1 TABLET AT BEDTIME 04/09/16   [name], MD  hydrALAZINE (APRESOLINE) 50 MG tablet  TAKE 1 TABLET TWICE DAILY 02/09/16   Herminio Commons, MD  LANTUS SOLOSTAR 100 UNIT/ML Solostar Pen Inject 80 Units as directed at bedtime. 10/26/15   Historical Provider, MD  metoprolol tartrate (LOPRESSOR) 25 MG tablet TAKE 1 TABLET TWICE DAILY 04/17/16   [name], MD  torsemide (DEMADEX) 20 MG tablet Take 10-20 mg by mouth daily. Alternated one tab with half a tab every other day    Historical Provider, MD  traMADol (ULTRAM) 50 MG tablet TAKE 1 TABLET BY MOUTH EVERY 6 HOURS AS NEEDED FOR PAIN. 03/27/16   Mikey Kirschner, MD   Fam HX:    Family History  Problem Relation Age of Onset  . Diabetes Mother   . Heart attack Mother   . Stroke Mother   . Diabetes Sister   . Sleep apnea Sister   . Hypertension Brother   . Diabetes Brother   . Diabetes Brother   . Hypertension Brother   . Colon cancer Neg Hx   . Liver  disease Neg Hx    Social HX:    Social History   Social History  . Marital status: Married    Spouse name: N/A  . Number of children: 2  . Years of education: N/A   Occupational History  . Shipping [employer]   Social History Main Topics  . Smoking status: Never Smoker  . Smokeless tobacco: Never Used  . Alcohol use No     Comment: heavy etoh use 30 years ago  . Drug use: No  . Sexual activity: Yes    Birth control/ protection: None   Other Topics Concern  . 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 Disabled at this point.     ROS:  All 11 ROS were addressed and are negative except what is stated in the HPI   Physical Exam: Blood pressure 185/83, pulse 77, temperature 98.9 F (37.2 C), temperature source Oral, resp. rate 15, SpO2 98 %.   General: Well developed, well nourished, in no acute distress Head: Eyes PERRLA, No xanthomas.   Normal cephalic and atramatic  Lungs:  Clear bilaterally to auscultation and percussion. Normal respiratory effort. No wheezes, no  rales. Heart:  HRRR S1 S2 Pulses are 2+ & equal. No murmurs, rubs or gallops.             No carotid bruit. No JVD.  No abdominal bruits. Abdomen: Bowel sounds are positive, abdomen soft and non-tender without masses or                 Hernia's noted. No hepatosplenomegaly.Obese Msk:  Back normal, normal gait. Normal strength and tone for age. Extremities:  No clubbing, cyanosis, 2+ bilateral lower extremity edema.  DP +1 Neuro: Alert and oriented X 3, non-focal, MAE x 4 GU: Deferred Rectal: Deferred Psych:  Good affect, responds appropriately         Labs:   Lab Results  Component Value Date   WBC 6.8 11/11/2015   HGB 10.7 (L) 11/11/2015   HCT 32.2 (L) 11/11/2015   MCV 89.0 11/11/2015   PLT 167 11/11/2015    Recent Labs Lab 06/14/16 1435  NA 135  K 4.8  CL 104  CO2 23  BUN 32*  CREATININE 1.61*  CALCIUM 9.3  GLUCOSE 203*    Recent Labs  06/14/16 1435  TROPONINI 0.11*   Lab Results  Component Value Date   CHOL 208 (H) 03/22/2016   HDL 34 (L) 03/22/2016   LDLCALC 123 (H) 03/22/2016   TRIG 255 (H) 03/22/2016   Lab Results  Component Value Date   DDIMER 0.58 (H) 05/26/2015     Radiology:  No results found. Personally viewed.   EKG: Sinus rhythm, septal Q waves, poor R-wave progression, slight increased amplitude T-wave in V3 and V4 with J-point elevation. No significant change from prior EKG.  Personally viewed.[name]  ECHO 11/10/15: - Moderate to severe LVH with LVEF 55-60%. Grade 2 diastolic   dysfunction with increased LV filling pressure. Mild left atrial   enlargement. Mildly dilated aortic root. Trivial tricuspid   regurgitation, unable to assess PASP.Circumferential pericardial   effusion noted. There is a small collection anteriorly with large   collection posterior to the heart and also right atrium. No   obvious RV compromise to indicate tamponade physiology. Effusion   appears larger compared to the previous study in September  2016.  ASSESSMENT/PLAN:   60 year old  male with known right coronary artery stents with one day history of waxing and waning chest discomfort, accelerated hypertension, diabetes, hyperlipidemia intolerant to statins here with troponin of 0.11, currently chest pain-free with nitroglycerin-non-ST elevation myocardial infarction.  Non-ST elevation myocardial infarction  - Continue with aspirin, Plavix, metoprolol.  - He is not on angiotensin receptor blocker because of hyperkalemia.  - Try to decrease blood pressure with amlodipine perhaps.  - He is statin intolerant and refuses statin therapy.  - His mildly elevated troponin, we will continue to trend. If this remains low level and flat, this is likely demand ischemia in the setting of his hypertensive heart disease. If it continues to increase significantly especially in the setting of his waxing and waning chest discomfort, I would advocate for cardiac catheterization. Risks and benefits have been discussed including stroke, heart attack, death, renal impairment, bleeding. His creatinine is 1.6. We will hydrate gently overnight.  - Moderate to severe LVH noted on echocardiogram previously.  - Repeat echocardiogram given change in symptoms. Long-standing pericardial effusion.  - Prior inferior myocardial infarction with DES to 100% RCA occlusion. Previously placed proximal RCA and PDA stents. January 2014.  Hypertensive urgency  - Antihypertensives as above.  - I will add long-acting nitrate Imdur 30 mg once a day.  - He has been intolerant to amlodipine in the past. See allergies. He is also intolerant to angiotensin receptor blockers because of hyperkalemia.  - Hydralazine 50 mg twice a day.  - Increasing metoprolol to 50 mg twice a day.  - Consider IV nitroglycerin if necessary.  Diabetes  - We will give Lantus 40 units at bedtime, he usually takes 80 at home.  Chronic kidney disease stage III  - Creatinine has ranged from 1.5-1.74  recently.  - Gentle hydration overnight.  - Hold home torsemide.  Candee Furbish, MD  06/14/2016  5:31 PM

## 2016-06-14 NOTE — ED Provider Notes (Signed)
[facility] DEPT Provider Note   CSN: [id number] Arrival date & time: 06/14/16  1656     History   Chief Complaint Chief Complaint  Patient presents with  . Chest Pain    HPI Eugene [name] is a 60 y.o. male. He presents via EMS with a chief complaint of chest pain.  She has a history of known coronary artery disease with multiple right coronary arterial system stents. He follows with Dr. Irish Lack.  His last cath was 2014.  He's been having intermittent episodes of chest pain for the last several weeks. He was seen by Dr. Bernita Buffy in [place] today. He had an EKG that showed persistence of a millimeter of elevation in his V3 lead. Dr. Liborio Nixon felt that there was not significant change on this compared to his old. He sent the patient for labs the hospital. He received a call status report was elevated at 0.11. He called the patient at home. The patient called 911 and was transferred here.  Was given the patient's EKG which was being transferred as a "code STEMI". I do not see indications for activation Cath Lab team. He does not have greater than 1 mm of elevation is not have elevations in contiguous leads and it is indeed unchanged. He arrives here having complete resolution of his chest pain having been given nitroglycerin en route by paramedics.  HPI  Past Medical History:  Diagnosis Date  . Anginal pain ([place])   . Anxiety   . Arteriosclerotic cardiovascular disease (ASCVD)    a. 05/2011 Cath/PCI: LM nl, LAD 56m, D1 small, D2 small 44m, LCX large 40p, RCA 50-60p, 99 hazy @ origin of PDA with 70-80 in PDA (2.5x26 Resolute Integrity & 3.0x15 Resolute Integrity DES).;  b. 08/2012 Inflat  STEMI/Cath/PCI: LM minor irregs, LAD 50p, D1 50, LCX nl, OM1 25, RCA 30-40p, 100d (treated with 2.75x16mm Promus Premier DES);  c. 08/2012 Echo: EF 55-60%, basal inferopost HK.  . Asthma   . Bell palsy   . C. difficile colitis    a. 08/2012  . CHF (congestive heart failure) ([place])   .  Cholelithiasis 07/2012   Asymptomatic; identified incidentally  . Contrast dye induced nephropathy    a. 08/2012 post cath/pci  . COPD (chronic obstructive pulmonary disease) ([place])   . Diabetes mellitus    Peripheral neuropathy  . Gallstones   . GERD (gastroesophageal reflux disease)   . Heart disease   . Hyperlipidemia   . Hypertension   . Myocardial infarct 09/08/12  . Nephrolithiasis   . Old myocardial infarction   . PONV (postoperative nausea and vomiting)   . Sleep apnea     Patient Active Problem List   Diagnosis Date Noted  . Hyperkalemia 06/14/2016  . Non-ST elevation myocardial infarction (NSTEMI) ([place])   . Morbid obesity ([place])   . Diabetes mellitus type 2, uncontrolled, with complications ([place]) 19/41/7408  . Right carotid bruit 11/10/2015  . Pain in the chest   . Severe hypertension 11/09/2015  . Hypertensive urgency 11/09/2015  . Diastolic CHF ([place]) 14/48/1856  . Gout of wrist 08/08/2015  . Pericardial effusion without cardiac tamponade 05/26/2015  . HLD (hyperlipidemia) 05/26/2015  . Anxiety 05/26/2015  . Olecranon bursitis   . Inflammatory arthritis   . Elbow joint pain 01/21/2015  . SOB (shortness of breath) on exertion 12/26/2014  . Atypical chest pain 12/25/2014  . Constipation 10/26/2014  . Abdominal pain 10/26/2014  . Peripheral edema 01/29/2014  . Acute on chronic diastolic heart  failure ([place]) 01/26/2014  . Acute on chronic renal failure ([place]) 01/26/2014  . Chronic renal insufficiency, stage III (moderate) 12/14/2013  . Chest pain 12/13/2013  . SOB (shortness of breath) 10/16/2013  . Dyspnea 10/15/2013  . Chronic diastolic CHF (congestive heart failure) ([place]) 10/15/2013  . NSVT (nonsustained ventricular tachycardia) ([place]) 06/26/2013  . Type 2 diabetes mellitus with neurological manifestations ([place]) 06/26/2013  . Acute diastolic CHF (congestive heart failure) ([place]) 06/26/2013  . Pericardial effusion 06/26/2013  . Old myocardial infarction   .  Gastroparesis due to DM ([place]) 05/21/2013  . CAD (coronary artery disease) 09/16/2012  . Acute on chronic renal insufficiency 09/14/2012  . Acute systolic CHF (congestive heart failure), NYHA class 3 ([place]) 09/14/2012  . Edema 08/18/2012  . Anemia, normocytic normochromic 07/31/2012  . Cholelithiasis 07/27/2012  . Gastroesophageal reflux disease 05/10/2012  . Noncompliance 05/09/2012  . Peripheral neuropathy ([place]) 10/24/2011  . Obstructive sleep apnea- on C-pap 08/23/2011  . Hyponatremia 08/23/2011  . CAD- MI/RCA PCI-DES x2 2012, and RCA DES Jan 2014   . Hyperlipidemia   . Cholelithiasis 05/28/2011  . DM type 2 (diabetes mellitus, type 2) ([place]) 05/25/2011  . Essential hypertension 05/25/2011    Past Surgical History:  Procedure Laterality Date  . CARDIAC CATHETERIZATION    . CHEST TUBE INSERTION    . CIRCUMCISION    . COLONOSCOPY     In [facility], approximately 2011 per patient, was normal. Advised to come back in 10 years.  . ESOPHAGOGASTRODUODENOSCOPY     [place] over 20 yrs ago  . ESOPHAGOGASTRODUODENOSCOPY  06/12/2012   ZOX:[id number] esophagus-status post Venia Minks dilation. Abnormal gastric mucosa of uncertain significance-status post biopsy  . INTRAOPERATIVE TRANSESOPHAGEAL ECHOCARDIOGRAM N/A 06/23/2013   Procedure: INTRAOPERATIVE TRANSESOPHAGEAL ECHOCARDIOGRAM;  Surgeon: Grace Isaac, MD;  Location: [facility];  Service: Open Heart Surgery;  Laterality: N/A;  . LEFT HEART CATHETERIZATION WITH CORONARY ANGIOGRAM N/A 09/08/2012   Procedure: LEFT HEART CATHETERIZATION WITH CORONARY ANGIOGRAM;  Surgeon: [name], MD;  Location: [facility] CATH LAB;  Service: Cardiovascular;  Laterality: N/A;  . stents    . SUBXYPHOID PERICARDIAL WINDOW N/A 06/23/2013   Procedure: SUBXYPHOID PERICARDIAL WINDOW;  Surgeon: Grace Isaac, MD;  Location: [facility] OR;  Service: Thoracic;  Laterality: N/A;       Home Medications    Prior to Admission medications   Medication Sig Start  Date End Date Taking? Authorizing Provider  acetaminophen (TYLENOL) 325 MG tablet Take 650 mg by mouth every 6 (six) hours as needed for mild pain.   Yes Historical Provider, MD  albuterol (PROVENTIL HFA;VENTOLIN HFA) 108 (90 BASE) MCG/ACT inhaler Inhale 2 puffs into the lungs every 4 (four) hours as needed for shortness of breath. 09/28/14  Yes [name], MD  ALPRAZolam (XANAX) 0.5 MG tablet TAKE 1/2-1 TABLET BY MOUTH AT BEDTIME AS NEEDED Patient taking differently: TAKE 1/2-1 TABLET BY MOUTH AT BEDTIME AS NEEDED FOR ANXIETY 04/26/16  Yes [name], MD  aspirin EC 81 MG tablet Take 81 mg by mouth at bedtime.    Yes Historical Provider, MD  clopidogrel (PLAVIX) 75 MG tablet TAKE 1 TABLET AT BEDTIME 04/09/16  Yes [name], MD  esomeprazole (NEXIUM) 20 MG capsule Take 20 mg by mouth daily as needed (for acid reflux).   Yes Historical Provider, MD  fenofibrate 160 MG tablet TAKE 1 TABLET AT BEDTIME 04/09/16  Yes [name], MD  hydrALAZINE (APRESOLINE) 50 MG tablet TAKE 1 TABLET TWICE DAILY 02/09/16  Yes Herminio Commons, MD  insulin detemir (LEVEMIR) 100 UNIT/ML injection Inject 80 Units into the skin at bedtime.   Yes Historical Provider, MD  metoprolol tartrate (LOPRESSOR) 25 MG tablet TAKE 1 TABLET TWICE DAILY 04/17/16  Yes [name], MD  torsemide (DEMADEX) 20 MG tablet Take 10-20 mg by mouth daily. Alternated one tab with half a tab every other day   Yes Historical Provider, MD  traMADol (ULTRAM) 50 MG tablet TAKE 1 TABLET BY MOUTH EVERY 6 HOURS AS NEEDED FOR PAIN. 03/27/16  Yes Mikey Kirschner, MD    Family History Family History  Problem Relation Age of Onset  . Diabetes Mother   . Heart attack Mother   . Stroke Mother   . Diabetes Sister   . Sleep apnea Sister   . Hypertension Brother   . Diabetes Brother   . Diabetes Brother   . Hypertension Brother   . Colon cancer Neg Hx   . Liver disease Neg Hx     Social History Social History  Substance Use Topics  .  Smoking status: Never Smoker  . Smokeless tobacco: Never Used  . Alcohol use No     Comment: heavy etoh use 30 years ago     Allergies   Hydrocodone; Lisinopril; Neurontin [gabapentin]; Statins; Metformin and related; and Norvasc [amlodipine besylate]   Review of Systems Review of Systems  Constitutional: Negative for appetite change, chills, diaphoresis, fatigue and fever.  HENT: Negative for mouth sores, sore throat and trouble swallowing.   Eyes: Negative for visual disturbance.  Respiratory: Positive for shortness of breath. Negative for cough, chest tightness and wheezing.   Cardiovascular: Positive for chest pain.  Gastrointestinal: Negative for abdominal distention, abdominal pain, diarrhea, nausea and vomiting.  Endocrine: Negative for polydipsia, polyphagia and polyuria.  Genitourinary: Negative for dysuria, frequency and hematuria.  Musculoskeletal: Negative for gait problem.  Skin: Negative for color change, pallor and rash.  Neurological: Negative for dizziness, syncope, light-headedness and headaches.  Hematological: Does not bruise/bleed easily.  Psychiatric/Behavioral: Negative for behavioral problems and confusion.     Physical Exam Updated Vital Signs BP (!) 153/81 (BP Location: Right Arm)   Pulse 66   Temp 98.7 F (37.1 C) (Oral)   Resp 17   Ht 6\' 1"  (1.854 m)   Wt 258 lb 9.6 oz (117.3 kg)   SpO2 100%   BMI 34.12 kg/m   Physical Exam  Constitutional: He is oriented to person, place, and time. He appears well-developed and well-nourished. No distress.  HENT:  Head: Normocephalic.  Eyes: Conjunctivae are normal. Pupils are equal, round, and reactive to light. No scleral icterus.  Neck: Normal range of motion. Neck supple. No thyromegaly present.  Cardiovascular: Normal rate and regular rhythm.  Exam reveals no gallop and no friction rub.   No murmur heard. Pulmonary/Chest: Effort normal and breath sounds normal. No respiratory distress. He has no  wheezes. He has no rales.  Abdominal: Soft. Bowel sounds are normal. He exhibits no distension. There is no tenderness. There is no rebound.  Musculoskeletal: Normal range of motion.  Neurological: He is alert and oriented to person, place, and time.  Skin: Skin is warm and dry. No rash noted.  Psychiatric: He has a normal mood and affect. His behavior is normal.     ED Treatments / Results  Labs (all labs ordered are listed, but only abnormal results are displayed) Labs Reviewed  TROPONIN I - Abnormal; Notable for the following:  Result Value   Troponin I 0.11 (*)    All other components within normal limits  GLUCOSE, CAPILLARY - Abnormal; Notable for the following:    Glucose-Capillary 158 (*)    All other components within normal limits  PROTIME-INR  CBC  HEPARIN LEVEL (UNFRACTIONATED)  TROPONIN I  TROPONIN I  BASIC METABOLIC PANEL  CBC    EKG  EKG Interpretation  Date/Time:  Thursday June 14 2016 17:04:32 EDT Ventricular Rate:  85 PR Interval:    QRS Duration: 117 QT Interval:  368 QTC Calculation: 438 R Axis:   68 Text Interpretation:  Sinus rhythm Left posterior fascicular block Anterior infarct, old Minimal ST elevation, lateral leads Confirmed by Jeneen Rinks  MD, [place] ([id number]) on 06/14/2016 5:08:31 PM Also confirmed by Jeneen Rinks  MD, [place] ([id number])  on 06/14/2016 5:15:46 PM       Radiology No results found.  Procedures Procedures (including critical care time)  Medications Ordered in ED Medications  heparin ADULT infusion 100 units/mL (25000 units/23mL sodium chloride 0.45%) (1,300 Units/hr Intravenous New Bag/Given 06/14/16 1721)  ezetimibe (ZETIA) tablet 10 mg (10 mg Oral Not Given 06/14/16 2015)  ALPRAZolam (XANAX) tablet 0.5 mg (not administered)  clopidogrel (PLAVIX) tablet 75 mg (75 mg Oral Given 06/14/16 2258)  fenofibrate tablet 160 mg (160 mg Oral Given 06/14/16 2258)  traMADol (ULTRAM) tablet 50 mg (not administered)  hydrALAZINE (APRESOLINE)  tablet 50 mg (50 mg Oral Given 06/14/16 2258)  albuterol (PROVENTIL) (2.5 MG/3ML) 0.083% nebulizer solution 3 mL (not administered)  aspirin chewable tablet 324 mg (324 mg Oral Not Given 06/14/16 2015)    Or  aspirin suppository 300 mg ( Rectal See Alternative 06/14/16 2015)  aspirin EC tablet 81 mg (not administered)  nitroGLYCERIN (NITROSTAT) SL tablet 0.4 mg (not administered)  acetaminophen (TYLENOL) tablet 650 mg (not administered)  ondansetron (ZOFRAN) injection 4 mg (not administered)  insulin aspart (novoLOG) injection 0-15 Units (not administered)  insulin glargine (LANTUS) injection 40 Units (40 Units Subcutaneous Given 06/14/16 2258)  isosorbide mononitrate (IMDUR) 24 hr tablet 30 mg (30 mg Oral Not Given 06/14/16 2015)  sodium chloride flush (NS) 0.9 % injection 3 mL (3 mLs Intravenous Not Given 06/14/16 2200)  sodium chloride flush (NS) 0.9 % injection 3 mL (not administered)  0.9 %  sodium chloride infusion (not administered)  0.9 %  sodium chloride infusion ( Intravenous New Bag/Given 06/14/16 2258)  metoprolol (LOPRESSOR) tablet 50 mg (50 mg Oral Given 06/14/16 2258)  heparin bolus via infusion 4,000 Units (4,000 Units Intravenous Bolus from Bag 06/14/16 1721)     Initial Impression / Assessment and Plan / ED Course  I have reviewed the triage vital signs and the nursing notes.  Pertinent labs & imaging results that were available during my care of the patient were reviewed by me and considered in my medical decision making (see chart for details).  Clinical Course   Patient given heparin bolus upon arrival. He remained symptom-free. I discussed the case with Dr. Gypsy Balsam of cardiology. He is here planning admission. Patient remains asymptomatic.  Final Clinical Impressions(s) / ED Diagnoses   Final diagnoses:  NSTEMI (non-ST elevated myocardial infarction) ([place])    CRITICAL CARE Performed by: Tanna Furry JOSEPH   Total critical care time: 30 minutes  Critical  care time was exclusive of separately billable procedures and treating other patients.  Critical care was necessary to treat or prevent imminent or life-threatening deterioration.  Critical care was time spent personally by me on the following activities:  development of treatment plan with patient and/or surrogate as well as nursing, discussions with consultants, evaluation of patient's response to treatment, examination of patient, obtaining history from patient or surrogate, ordering and performing treatments and interventions, ordering and review of laboratory studies, ordering and review of radiographic studies, pulse oximetry and re-evaluation of patient's condition.     New Prescriptions Current Discharge Medication List       Tanna Furry, MD 06/14/16 2357

## 2016-06-14 NOTE — Progress Notes (Signed)
CRITICAL VALUE ALERT  Critical value received:  Troponin 0.11  Date of notification:  06/14/16  Time of notification:  2103  Critical value read back:Yes.    Nurse who received alert:  Waynetta Sandy, RN  MD notified (1st page): Dr. Marlowe Sax  Time of first page:    MD notified (2nd page):  Time of second page:  Responding MD:  Dr. Marlowe Sax  Time MD responded:

## 2016-06-14 NOTE — Progress Notes (Signed)
   Subjective:    Patient ID: Eugene [name], male    DOB: 1956-01-05, 60 y.o.   MRN: [id number]  Diabetes  He presents for his follow-up diabetic visit. He has type 2 diabetes mellitus. There are no hypoglycemic associated symptoms. There are no diabetic associated symptoms. There are no hypoglycemic complications. There are no diabetic complications. There are no known risk factors for coronary artery disease. Current diabetic treatment includes insulin injections. He is compliant with treatment all of the time.   Results for orders placed or performed in visit on 06/14/16  POCT glycosylated hemoglobin (Hb A1C)  Result Value Ref Range   Hemoglobin A1C 7.9     Patient states that he has chest pain and tightness. Pain radiates to the right shoulder blade. Onset yesterday. The patient states that he is had off and on chest tightness that radiates to his back yesterday evening during the night in today he denies shortness breath denies sweats denies heaviness. No nausea vomiting diarrhea. States it's seems to be mild.  Patient does have known cardiac disease. This patient also has history of hyperkalemia we had recently stopped losartan. Also history hyperlipidemia. History of heart failure and diabetes.  Review of Systems Please see above. No fevers chills cough. Denies any pain with movement. Denies angina with activity. States he's been fishing lately without any symptoms or difficulty    Objective:   Physical Exam Lungs clear no crackles heart rate controlled abdomen soft back nontender extremities 1-2+ edema which is normal for him Neurologic diabetes neuropathy in the feet noted  EKG was ordered. Shows ST segment strain in lead 1 and aVL. This compares very favorably to 11/09/2015. Patient also has some ST segment elevation of 1-2 mm in V2 whereas in March 2017 is 1 mm. Has Q-wave appearance in V2 V3 and V4 along with loss of height of QRS in V5 and V6. The loss of R-wave height in  V5 V6 is new     Assessment & Plan:  Diabetes fair control we talked about multiple different parameters to do to try to improve his diabetic control  History hyperkalemia recheck metabolic 7  Blood pressure control overall good  EKG changes with atypical pain-given that he is a diabetic there is an increased risk of heart disease issues including MI. Patient did not want to go to ER and now is a reasonably he did not go last night. We will order troponin. If this is elevated the patient will need emergent care if normal he will need urgent follow-up with cardiology  Troponin came back at 0.11. I spoke with the patient. He understood to activate 911. They will take him immediately to .  ER M.D. was spoken with regarding this patient

## 2016-06-15 ENCOUNTER — Other Ambulatory Visit (HOSPITAL_COMMUNITY): Payer: Commercial Managed Care - HMO

## 2016-06-15 ENCOUNTER — Encounter (HOSPITAL_COMMUNITY)
Admission: EM | Disposition: A | Discharge status: Home / Self Care | Payer: Self-pay | Source: Home / Self Care | Attending: Cardiology

## 2016-06-15 DIAGNOSIS — I10 Essential (primary) hypertension: Secondary | ICD-10-CM

## 2016-06-15 DIAGNOSIS — E7800 Pure hypercholesterolemia, unspecified: Secondary | ICD-10-CM

## 2016-06-15 DIAGNOSIS — I248 Other forms of acute ischemic heart disease: Secondary | ICD-10-CM

## 2016-06-15 DIAGNOSIS — I2510 Atherosclerotic heart disease of native coronary artery without angina pectoris: Secondary | ICD-10-CM

## 2016-06-15 LAB — CBC
HCT: 32.3 % — ABNORMAL LOW (ref 39.0–52.0)
HEMATOCRIT: 32.6 % — AB (ref 39.0–52.0)
HEMOGLOBIN: 11 g/dL — AB (ref 13.0–17.0)
Hemoglobin: 10.7 g/dL — ABNORMAL LOW (ref 13.0–17.0)
MCH: 30.1 pg (ref 26.0–34.0)
MCH: 29.3 pg (ref 26.0–34.0)
MCHC: 34.1 g/dL (ref 30.0–36.0)
MCHC: 32.8 g/dL (ref 30.0–36.0)
MCV: 88.5 fL (ref 78.0–100.0)
MCV: 89.3 fL (ref 78.0–100.0)
PLATELETS: 169 10*3/uL (ref 150–400)
Platelets: 156 10*3/uL (ref 150–400)
RBC: 3.65 MIL/uL — ABNORMAL LOW (ref 4.22–5.81)
RBC: 3.65 MIL/uL — AB (ref 4.22–5.81)
RDW: 13.5 % (ref 11.5–15.5)
RDW: 13.6 % (ref 11.5–15.5)
WBC: 6.5 10*3/uL (ref 4.0–10.5)
WBC: 5.1 10*3/uL (ref 4.0–10.5)

## 2016-06-15 LAB — GLUCOSE, CAPILLARY
GLUCOSE-CAPILLARY: 244 mg/dL — AB (ref 65–99)
Glucose-Capillary: 200 mg/dL — ABNORMAL HIGH (ref 65–99)

## 2016-06-15 LAB — BASIC METABOLIC PANEL
ANION GAP: 7 (ref 5–15)
BUN: 32 mg/dL — AB (ref 6–20)
CHLORIDE: 105 mmol/L (ref 101–111)
CO2: 24 mmol/L (ref 22–32)
Calcium: 8.5 mg/dL — ABNORMAL LOW (ref 8.9–10.3)
Creatinine, Ser: 1.75 mg/dL — ABNORMAL HIGH (ref 0.61–1.24)
GFR, EST AFRICAN AMERICAN: 47 mL/min — AB (ref >60)
GFR, EST NON AFRICAN AMERICAN: 41 mL/min — AB (ref >60)
Glucose, Bld: 203 mg/dL — ABNORMAL HIGH (ref 65–99)
POTASSIUM: 4.3 mmol/L (ref 3.5–5.1)
SODIUM: 136 mmol/L (ref 135–145)

## 2016-06-15 LAB — HEPARIN LEVEL (UNFRACTIONATED)
HEPARIN UNFRACTIONATED: 0.16 [IU]/mL — AB (ref 0.30–0.70)
Heparin Unfractionated: 0.43 IU/mL (ref 0.30–0.70)

## 2016-06-15 LAB — TROPONIN I
TROPONIN I: 0.06 ng/mL — AB (ref <0.03)
Troponin I: 0.07 ng/mL (ref <0.03)

## 2016-06-15 SURGERY — LEFT HEART CATH AND CORONARY ANGIOGRAPHY
Anesthesia: LOCAL

## 2016-06-15 MED ORDER — EZETIMIBE 10 MG PO TABS: 10.0000 mg | ORAL_TABLET | Freq: Every day | ORAL | 3 refills | Status: DC

## 2016-06-15 MED ORDER — METOPROLOL TARTRATE 50 MG PO TABS: 50.0000 mg | ORAL_TABLET | Freq: Two times a day (BID) | ORAL | 6 refills | Status: DC

## 2016-06-15 MED ORDER — HEPARIN BOLUS VIA INFUSION
3000.0000 [IU] | Freq: Once | INTRAVENOUS | Status: AC
  Administered 2016-06-15: 3000 [IU] via INTRAVENOUS
  Filled 2016-06-15: qty 3000

## 2016-06-15 MED ORDER — ISOSORBIDE MONONITRATE ER 30 MG PO TB24: 30.0000 mg | ORAL_TABLET | Freq: Every day | ORAL | 6 refills | Status: DC

## 2016-06-15 MED ORDER — NITROGLYCERIN 0.4 MG SL SUBL: 0.4000 mg | SUBLINGUAL_TABLET | SUBLINGUAL | 2 refills | Status: DC | PRN

## 2016-06-15 NOTE — Progress Notes (Signed)
Patient Name: Eugene [name] Date of Encounter: 06/15/2016  Primary Cardiologist: Dr. Marca Ancona Problem List     Principal Problem:   Chest pain Active Problems:   Essential hypertension   CAD- MI/RCA PCI-DES x2 2012, and RCA DES Jan 2014   Hyperlipidemia   Chronic diastolic CHF (congestive heart failure) (HCC)    Subjective   Denies any recurrent episodes of chest discomfort. Reports "feeeling great" this AM. Ambulated over 400 ft with no anginal symptoms.   Inpatient Medications    Scheduled Meds: . aspirin  324 mg Oral NOW   Or  . aspirin  300 mg Rectal NOW  . aspirin EC  81 mg Oral Daily  . clopidogrel  75 mg Oral QHS  . ezetimibe  10 mg Oral Daily  . fenofibrate  160 mg Oral QHS  . hydrALAZINE  50 mg Oral BID  . insulin aspart  0-15 Units Subcutaneous TID WC  . insulin glargine  40 Units Subcutaneous QHS  . isosorbide mononitrate  30 mg Oral Daily  . metoprolol tartrate  50 mg Oral BID  . sodium chloride flush  3 mL Intravenous Q12H   Continuous Infusions: . sodium chloride 75 mL/hr at 06/14/16 2258  . heparin 1,700 Units/hr (06/15/16 0611)   PRN Meds: sodium chloride, acetaminophen, albuterol, ALPRAZolam, nitroGLYCERIN, ondansetron (ZOFRAN) IV, sodium chloride flush, traMADol   Vital Signs    Vitals:   06/14/16 1930 06/14/16 2001 06/14/16 2003 06/15/16 0544  BP: 141/76  (!) 153/81 (!) 154/77  Pulse: 63  66 (!) 59  Resp: 17  17 18   Temp:   98.7 F (37.1 C) 97.8 F (36.6 C)  TempSrc:   Oral Oral  SpO2: 96% 100% 100% 96%  Weight:   258 lb 9.6 oz (117.3 kg) 259 lb (117.5 kg)  Height:   6\' 1"  (1.854 m)     Intake/Output Summary (Last 24 hours) at 06/15/16 0944 Last data filed at 06/15/16 0700  Gross per 24 hour  Intake           440.63 ml  Output              500 ml  Net           -59.37 ml   Filed Weights   06/14/16 2003 06/15/16 0544  Weight: 258 lb 9.6 oz (117.3 kg) 259 lb (117.5 kg)    Physical Exam   GEN: Overweight  Caucasian male appearing in no acute distress.  HEENT: Grossly normal.  Neck: Supple, no JVD, carotid bruits, or masses. Cardiac: RRR, no murmurs, rubs, or gallops. No clubbing, cyanosis, edema.  Radials/DP/PT 2+ and equal bilaterally.  Respiratory:  Respirations regular and unlabored, clear to auscultation bilaterally. GI: Soft, nontender, nondistended, BS + x 4. MS: no deformity or atrophy. Skin: warm and dry, no rash. Neuro:  Strength and sensation are intact. Psych: AAOx3.  Normal affect.  Labs    CBC  Recent Labs  06/15/16 0232 06/15/16 0820  WBC 6.5 5.1  HGB 11.0* 10.7*  HCT 32.3* 32.6*  MCV 88.5 89.3  PLT 169 284   Basic Metabolic Panel  Recent Labs  06/14/16 1435  NA 135  K 4.8  CL 104  CO2 23  GLUCOSE 203*  BUN 32*  CREATININE 1.61*  CALCIUM 9.3   Cardiac Enzymes  Recent Labs  06/14/16 1435 06/14/16 2006 06/15/16 0232  TROPONINI 0.11* 0.11* 0.07*   BNP Invalid input(s): POCBNP D-Dimer No results for input(s): DDIMER  in the last 72 hours. Hemoglobin A1C  Recent Labs  06/14/16 1331  HGBA1C 7.9    Telemetry    Sinus bradycardia, HR in 50's - 60's. No atopic events.  - Personally Reviewed  ECG    No new tracings.   Radiology    No results found.  Cardiac Studies   Echocardiogram: 10/2015 Study Conclusions - Left ventricle: The cavity size was normal. Wall thickness was   increased in a pattern of moderate to severe LVH. Systolic   function was normal. The estimated ejection fraction was in the   range of 55% to 60%. Wall motion was normal; there were no   regional wall motion abnormalities. Features are consistent with   a pseudonormal left ventricular filling pattern, with concomitant   abnormal relaxation and increased filling pressure (grade 2   diastolic dysfunction). - Aortic root: The aortic root was mildly dilated. - Mitral valve: There was trivial regurgitation. - Left atrium: The atrium was mildly dilated. - Right  atrium: Central venous pressure (est): 15 mm Hg. - Atrial septum: No defect or patent foramen ovale was identified. - Tricuspid valve: There was trivial regurgitation. - Pulmonary arteries: Systolic pressure could not be accurately   estimated. - Pericardium, extracardiac: Circumferential pericardial effusion   noted. There is a small collection anteriorly with large   collection posterior to the heart and also right atrium. No   obvious RV compromise to indicate tamponade physiology. Effusion   is larger compared to the previous study in September 2016.  Impressions: - Moderate to severe LVH with LVEF 55-60%. Grade 2 diastolic   dysfunction with increased LV filling pressure. Mild left atrial   enlargement. Mildly dilated aortic root. Trivial tricuspid   regurgitation, unable to assess PASP.Circumferential pericardial   effusion noted. There is a small collection anteriorly with large   collection posterior to the heart and also right atrium. No   obvious RV compromise to indicate tamponade physiology. Effusion   appears larger compared to the previous study in September 2016.  Patient Profile     60 yo male w/ PMH of CAD (s/p DES to RCA in 2012 and 2014), chronic diastolic CHF, HTN, HLD, and Type 2 DM who presented to [facility] ED on 10/19 for evaluation of chest pain. Initial troponin elevated at 0.11 and was transferred to [facility] for likely cardiac catheterization.  Assessment & Plan    1. Non-ST elevation myocardial infarction - has known CAD with multiple DES placed to RCA with most recent in 1660. - cyclic troponin values have been 0.11, 0.07, and 0.06. Possibly secondary to demand ischemia in the setting of his hypertensive heart disease. - he denies any repeat episodes of chest discomfort since being admitted. I personally walked with him over 400 ft and he denied any anginal symptoms. Discussed with Dr. Marlou Porch, will take off of cath board today as symptoms were likely  secondary to hypertensive urgency. This plan was reviewed with the patient.    - Repeat echocardiogram recommended given history of long-standing pericardial effusion. Can perform as outpatient if not done prior to discharge.   - continue ASA, Zetia (intolerant to statins), Plavix, BB, and Imdur.   2. Hypertensive urgency - has been intolerant to Amlodipine and ARB's. Started on Imdur 30mg  daily at time of admission.  - continue Hydralazine 50mg  BID and increased Lopressor dosing of 50mg  BID. Can consider adjusting Hydralazine to TID dosing but concern for compliance. Would not titrate BB further at this  time with episodes of HR in the mid-50's.   3. Type 2 DM - continue Lantus (half-dose previous night due to NPO status) and SSI.   4. Chronic kidney disease stage III - creatinine has ranged from 1.5-1.93 recently. - creatinine 1.75 this AM. Receiving gentle IV Hydration.   5. HLD - Lipid Panel in 10/2015 showed total cholesterol 274, triglycerides 550, and HDL 28. LDL unable to be calculated.  - intolerant to statin therapy. Continue Zetia (just started on 05/07/2016) and Fenofibrate. Consider referral for PCSK9 inhibitor if repeat Lipid Panel shows elevated values.   Signed, Erma Heritage, PA  06/15/2016, 9:44 AM   Personally seen and examined. Agree with above.  Feels great. Likely demand ischemia in the setting of HTN.  Med changes as above. RRR, CTAB Comfortable with DC  Candee Furbish, MD

## 2016-06-15 NOTE — Progress Notes (Signed)
Pt has orders to be discharged. Discharge instructions given and pt has no additional questions at this time. Medication regimen reviewed and pt educated. Pt verbalized understanding and has no additional questions. Telemetry box removed. IV removed and site in good condition. Pt stable and waiting for transportation.   Tedi Hughson RN 

## 2016-06-15 NOTE — Progress Notes (Signed)
ANTICOAGULATION CONSULT NOTE - Follow-up Consult  Pharmacy Consult for Heparin Indication: chest pain/ACS  Allergies  Allergen Reactions  . Hydrocodone Nausea And Vomiting  . Lisinopril Cough  . Neurontin [Gabapentin] Other (See Comments)    Suicidal thoughts  . Statins Other (See Comments)    Muscle aches  . Metformin And Related Diarrhea and Other (See Comments)    Intestinal side effects  . Norvasc [Amlodipine Besylate] Swelling    Pedal edema   Patient Measurements: Height: 6\' 1"  (185.4 cm) Weight: 258 lb 9.6 oz (117.3 kg) IBW/kg (Calculated) : 79.9 Heparin Dosing Weight: 105.4 kg  Vital Signs: Temp: 98.7 F (37.1 C) (10/19 2003) Temp Source: Oral (10/19 2003) BP: 153/81 (10/19 2003) Pulse Rate: 66 (10/19 2003)  Labs:  Recent Labs  06/14/16 1435 06/14/16 2006 06/14/16 2011 06/14/16 2328  LABPROT  --   --  13.2  --   INR  --   --  1.00  --   HEPARINUNFRC  --   --   --  0.16*  CREATININE 1.61*  --   --   --   TROPONINI 0.11* 0.11*  --   --     Estimated Creatinine Clearance: 66.3 mL/min (by C-G formula based on SCr of 1.61 mg/dL (H)).  Assessment: 60 yo on heparin for NSTEMI. Heparin level subtherapeutic (0.16) on gtt at 1300 units/hr. No issues with line or bleeding reported per RN.  Goal of Therapy:  Heparin level 0.3-0.7 units/ml Monitor platelets by anticoagulation protocol: Yes   Plan:  Rebolus heparin 3000 units Increase heparin to 1700 units/ hour 6 hour heparin level  Sherlon Handing, PharmD, BCPS Clinical pharmacist, pager [phone] 06/15/2016,1:14 AM

## 2016-06-15 NOTE — Discharge Summary (Signed)
Discharge Summary    Patient ID: Eugene [name],  MRN: [id number], DOB/AGE: 12/20/1955 60 y.o.  Admit date: 06/14/2016 Discharge date: 06/15/2016  Primary Care Provider: [name] Primary Cardiologist: Dr. Bronson Ing  Discharge Diagnoses    Principal Problem:   Chest pain Active Problems:   Essential hypertension   CAD- MI/RCA PCI-DES x2 2012, and RCA DES Jan 2014   Hyperlipidemia   Chronic diastolic CHF (congestive heart failure) ([place])   History of Present Illness     Eugene [name] is a 60 y.o. male with past medical history of CAD (s/p DES to RCA in 2012 and 2014), chronic diastolic CHF, HTN, HLD, and Type 2 DM who presented to [facility] ED on 10/19 for evaluation of chest pain.  He was seen by his PCP for evaluation of chest pain. Had developed a chest pressure earlier that day which radiated to his back. No associated nausea, vomiting, dyspnea or diaphoresis. Took Nexium without relief. Had slight ST elevation in V3 which was thought to be unchanged from previous tracings. Initial troponin was elevated to 0.11 and he was informed to go to the ER. Initial SBP was elevated to greater than 200.   He was transferred to [facility] for further evaluation. Cyclic troponin values were to be obtained and if they trended upwards, then plan for a cardiac catheterization the following day.   Hospital Course     Consultants: None  The following morning, he denied any repeat episodes of chest pain. He ambulated over 400 ft without any anginal symptoms.  Cyclic troponin values began to trend down to 0.07 and 0.06. With no recurrent symptoms and his initial presentation, it was thought the troponin elevation was secondary to hypertensive urgency. With resolved symptoms, flat-troponin values, and Stage 3 CKD, plans for cardiac catheterization were cancelled.  In regards to HTN, he was continued on Hydralazine 50mg  BID and Lopressor was increased from 25mg  BID to 50mg  BID.  Was stared on Imdur 30mg  daily. For his HLD, he is intolerant to statin therapy. Is currently on Fenofibrate and was just recently prescribed Zetia but has been unable to get this filled secondary to financial restrictions. Will need to consider referral for PCSK9 as an outpatient.   He was last examined by Dr. Marlou Porch and deemed stable for discharge. Two-week Cardiology hospital follow-up has been arranged at the [place], [place] location. May require further titration of BP medications at that time. He was discharged home in good condition.   _____________  Discharge Vitals Blood pressure (!) 154/77, pulse (!) 59, temperature 97.8 F (36.6 C), temperature source Oral, resp. rate 18, height 6\' 1"  (1.854 m), weight 259 lb (117.5 kg), SpO2 96 %.  Filed Weights   06/14/16 2003 06/15/16 0544  Weight: 258 lb 9.6 oz (117.3 kg) 259 lb (117.5 kg)    Labs & Radiologic Studies     CBC  Recent Labs  06/15/16 0232 06/15/16 0820  WBC 6.5 5.1  HGB 11.0* 10.7*  HCT 32.3* 32.6*  MCV 88.5 89.3  PLT 169 357   Basic Metabolic Panel  Recent Labs  06/14/16 1435 06/15/16 0820  NA 135 136  K 4.8 4.3  CL 104 105  CO2 23 24  GLUCOSE 203* 203*  BUN 32* 32*  CREATININE 1.61* 1.75*  CALCIUM 9.3 8.5*   Liver Function Tests No results for input(s): AST, ALT, ALKPHOS, BILITOT, PROT, ALBUMIN in the last 72 hours. No results for input(s): LIPASE, AMYLASE in the  last 72 hours. Cardiac Enzymes  Recent Labs  06/14/16 2006 06/15/16 0232 06/15/16 0820  TROPONINI 0.11* 0.07* 0.06*   BNP Invalid input(s): POCBNP D-Dimer No results for input(s): DDIMER in the last 72 hours. Hemoglobin A1C  Recent Labs  06/14/16 1331  HGBA1C 7.9    Diagnostic Studies/Procedures    None Performed.   Disposition   Pt is being discharged home today in good condition.  Follow-up Plans & Appointments    Follow-up Information    Jory Sims, NP Follow up on 06/28/2016.   Specialties:  Nurse  Practitioner, Radiology, Cardiology Why:  [place] on 06/28/2016. (Dr. Court Joy Office) Contact information: [place] [address] [phone]          Discharge Instructions    Diet - low sodium heart healthy    Complete by:  As directed       Discharge Medications     Medication List    TAKE these medications   acetaminophen 325 MG tablet Commonly known as:  TYLENOL Take 650 mg by mouth every 6 (six) hours as needed for mild pain.   albuterol 108 (90 Base) MCG/ACT inhaler Commonly known as:  PROVENTIL HFA;VENTOLIN HFA Inhale 2 puffs into the lungs every 4 (four) hours as needed for shortness of breath.   ALPRAZolam 0.5 MG tablet Commonly known as:  XANAX TAKE 1/2-1 TABLET BY MOUTH AT BEDTIME AS NEEDED What changed:  See the new instructions.   aspirin EC 81 MG tablet Take 81 mg by mouth at bedtime.   clopidogrel 75 MG tablet Commonly known as:  PLAVIX TAKE 1 TABLET AT BEDTIME   esomeprazole 20 MG capsule Commonly known as:  NEXIUM Take 20 mg by mouth daily as needed (for acid reflux).   fenofibrate 160 MG tablet TAKE 1 TABLET AT BEDTIME   hydrALAZINE 50 MG tablet Commonly known as:  APRESOLINE TAKE 1 TABLET TWICE DAILY   insulin detemir 100 UNIT/ML injection Commonly known as:  LEVEMIR Inject 80 Units into the skin at bedtime.   isosorbide mononitrate 30 MG 24 hr tablet Commonly known as:  IMDUR Take 1 tablet (30 mg total) by mouth daily. Start taking on:  06/16/2016   metoprolol 50 MG tablet Commonly known as:  LOPRESSOR Take 1 tablet (50 mg total) by mouth 2 (two) times daily. What changed:  See the new instructions.   nitroGLYCERIN 0.4 MG SL tablet Commonly known as:  NITROSTAT Place 1 tablet (0.4 mg total) under the tongue every 5 (five) minutes x 3 doses as needed for chest pain.   torsemide 20 MG tablet Commonly known as:  DEMADEX Take 10-20 mg by mouth daily. Alternated one tab with half a tab  every other day   traMADol 50 MG tablet Commonly known as:  ULTRAM TAKE 1 TABLET BY MOUTH EVERY 6 HOURS AS NEEDED FOR PAIN.       Aspirin prescribed at discharge?  Yes High Intensity Statin Prescribed? (Lipitor 40-80mg  or Crestor 20-40mg ): No: Intolerant to Statin Therapy Beta Blocker Prescribed? Yes For EF 40% or less, Was ACEI/ARB Prescribed? No: N/A ADP Receptor Inhibitor Prescribed? (i.e. Plavix etc.-Includes Medically Managed Patients): Yes For EF <40%, Aldosterone Inhibitor Prescribed? No: EF > 40%. Was EF assessed during THIS hospitalization? Yes Was Cardiac Rehab II ordered? (Included Medically managed Patients): No: N/A. Not ACS.   Allergies Allergies  Allergen Reactions  . Hydrocodone Nausea And Vomiting  . Lisinopril Cough  . Neurontin [Gabapentin] Other (See Comments)  Suicidal thoughts  . Statins Other (See Comments)    Muscle aches  . Metformin And Related Diarrhea and Other (See Comments)    Intestinal side effects  . Norvasc [Amlodipine Besylate] Swelling    Pedal edema     Outstanding Labs/Studies   Repeat Echocardiogram as an Outpatient.   Duration of Discharge Encounter   Greater than 30 minutes including physician time.  Signed, Erma Heritage, PA-C 06/15/2016, 11:31 AM   Personally seen and examined. Agree with above.  Feels great. Likely demand ischemia in the setting of HTN.  Med changes as above. RRR, CTAB Comfortable with DC  Candee Furbish, MD

## 2016-06-26 ENCOUNTER — Encounter: Payer: Self-pay | Admitting: Family Medicine

## 2016-06-26 DIAGNOSIS — H2512 Age-related nuclear cataract, left eye: Secondary | ICD-10-CM | POA: Diagnosis not present

## 2016-06-28 ENCOUNTER — Encounter: Payer: Commercial Managed Care - HMO | Admitting: Adult Health

## 2016-07-02 ENCOUNTER — Ambulatory Visit: Payer: Commercial Managed Care - HMO | Admitting: Family Medicine

## 2016-07-23 ENCOUNTER — Encounter (INDEPENDENT_AMBULATORY_CARE_PROVIDER_SITE_OTHER): Payer: Commercial Managed Care - HMO | Admitting: Ophthalmology

## 2016-07-23 DIAGNOSIS — I10 Essential (primary) hypertension: Secondary | ICD-10-CM

## 2016-07-23 DIAGNOSIS — E113311 Type 2 diabetes mellitus with moderate nonproliferative diabetic retinopathy with macular edema, right eye: Secondary | ICD-10-CM

## 2016-07-23 DIAGNOSIS — H2511 Age-related nuclear cataract, right eye: Secondary | ICD-10-CM | POA: Diagnosis not present

## 2016-07-23 DIAGNOSIS — E113592 Type 2 diabetes mellitus with proliferative diabetic retinopathy without macular edema, left eye: Secondary | ICD-10-CM

## 2016-07-23 DIAGNOSIS — E11311 Type 2 diabetes mellitus with unspecified diabetic retinopathy with macular edema: Secondary | ICD-10-CM

## 2016-07-23 DIAGNOSIS — H35033 Hypertensive retinopathy, bilateral: Secondary | ICD-10-CM | POA: Diagnosis not present

## 2016-07-23 DIAGNOSIS — H43813 Vitreous degeneration, bilateral: Secondary | ICD-10-CM

## 2016-07-23 DIAGNOSIS — D3132 Benign neoplasm of left choroid: Secondary | ICD-10-CM | POA: Diagnosis not present

## 2016-07-27 ENCOUNTER — Other Ambulatory Visit: Payer: Self-pay | Admitting: Family Medicine

## 2016-07-27 NOTE — Telephone Encounter (Signed)
Ok plus 2 ref 

## 2016-07-30 DIAGNOSIS — H25011 Cortical age-related cataract, right eye: Secondary | ICD-10-CM | POA: Diagnosis not present

## 2016-07-30 DIAGNOSIS — H2511 Age-related nuclear cataract, right eye: Secondary | ICD-10-CM | POA: Diagnosis not present

## 2016-08-07 DIAGNOSIS — H25011 Cortical age-related cataract, right eye: Secondary | ICD-10-CM | POA: Diagnosis not present

## 2016-08-07 DIAGNOSIS — H25811 Combined forms of age-related cataract, right eye: Secondary | ICD-10-CM | POA: Diagnosis not present

## 2016-08-07 DIAGNOSIS — H2511 Age-related nuclear cataract, right eye: Secondary | ICD-10-CM | POA: Diagnosis not present

## 2016-08-14 ENCOUNTER — Encounter: Payer: Self-pay | Admitting: *Deleted

## 2016-08-22 DIAGNOSIS — E785 Hyperlipidemia, unspecified: Secondary | ICD-10-CM | POA: Diagnosis not present

## 2016-08-28 ENCOUNTER — Telehealth: Payer: Self-pay | Admitting: Cardiovascular Disease

## 2016-08-28 NOTE — Telephone Encounter (Signed)
LDL still elevated. Start Repatha

## 2016-08-28 NOTE — Telephone Encounter (Signed)
Eugene [name] called wanting to get test results of recent labs.

## 2016-08-28 NOTE — Telephone Encounter (Signed)
Please review recent Lipids scanned into EP[facility] & advise.  These labs were requested to be done 3 months after the 05/07/2016 office visit.

## 2016-08-31 NOTE — Telephone Encounter (Signed)
Patient notified.  Copy to pmd.  Stated he would like to think about this first.  He will call back when he decides or may wait till his March office visit to discuss further with Dr. Bronson Ing at his 6 month follow up.

## 2016-09-03 ENCOUNTER — Telehealth: Payer: Self-pay | Admitting: Family Medicine

## 2016-09-03 ENCOUNTER — Other Ambulatory Visit: Payer: Self-pay | Admitting: *Deleted

## 2016-09-03 MED ORDER — INSULIN DETEMIR 100 UNIT/ML FLEXPEN: 80.0000 [IU] | PEN_INJECTOR | Freq: Every day | SUBCUTANEOUS | 4 refills | Status: DC

## 2016-09-03 NOTE — Telephone Encounter (Signed)
May have 4 refills keep follow-up visit in February

## 2016-09-03 NOTE — Telephone Encounter (Signed)
Patient needing new prescription for Lev emir 100 units flex tough called into [facility] in [place]

## 2016-09-03 NOTE — Telephone Encounter (Signed)
Patient reported taking Levemir Flex 100 units at 06/14/16 OV.  OK to refill?

## 2016-09-03 NOTE — Telephone Encounter (Signed)
Discussed with pt. Med sent to pharm.  

## 2016-09-04 MED ORDER — HYDRALAZINE HCL 50 MG PO TABS: 50.0000 mg | ORAL_TABLET | Freq: Two times a day (BID) | ORAL | 1 refills | Status: DC

## 2016-09-07 ENCOUNTER — Other Ambulatory Visit: Payer: Self-pay | Admitting: Family Medicine

## 2016-09-17 ENCOUNTER — Encounter (INDEPENDENT_AMBULATORY_CARE_PROVIDER_SITE_OTHER): Payer: Commercial Managed Care - HMO | Admitting: Ophthalmology

## 2016-09-26 ENCOUNTER — Encounter (INDEPENDENT_AMBULATORY_CARE_PROVIDER_SITE_OTHER): Payer: Medicare HMO | Admitting: Ophthalmology

## 2016-09-26 DIAGNOSIS — H43813 Vitreous degeneration, bilateral: Secondary | ICD-10-CM

## 2016-09-26 DIAGNOSIS — E113592 Type 2 diabetes mellitus with proliferative diabetic retinopathy without macular edema, left eye: Secondary | ICD-10-CM | POA: Diagnosis not present

## 2016-09-26 DIAGNOSIS — I10 Essential (primary) hypertension: Secondary | ICD-10-CM

## 2016-09-26 DIAGNOSIS — E11311 Type 2 diabetes mellitus with unspecified diabetic retinopathy with macular edema: Secondary | ICD-10-CM | POA: Diagnosis not present

## 2016-09-26 DIAGNOSIS — H35033 Hypertensive retinopathy, bilateral: Secondary | ICD-10-CM | POA: Diagnosis not present

## 2016-09-26 DIAGNOSIS — E113311 Type 2 diabetes mellitus with moderate nonproliferative diabetic retinopathy with macular edema, right eye: Secondary | ICD-10-CM

## 2016-09-26 DIAGNOSIS — D3132 Benign neoplasm of left choroid: Secondary | ICD-10-CM

## 2016-09-26 LAB — HM DIABETES EYE EXAM

## 2016-09-27 ENCOUNTER — Other Ambulatory Visit: Payer: Self-pay | Admitting: Family Medicine

## 2016-09-27 NOTE — Telephone Encounter (Signed)
May have this +3 refills will need office visit later this spring

## 2016-09-28 ENCOUNTER — Other Ambulatory Visit: Payer: Self-pay | Admitting: Family Medicine

## 2016-09-30 NOTE — Telephone Encounter (Signed)
May have this +3 additional refills 

## 2016-10-01 ENCOUNTER — Encounter: Payer: Self-pay | Admitting: *Deleted

## 2016-10-08 ENCOUNTER — Ambulatory Visit: Payer: Commercial Managed Care - HMO | Admitting: Family Medicine

## 2016-10-08 ENCOUNTER — Telehealth: Payer: Self-pay | Admitting: Family Medicine

## 2016-10-08 NOTE — Telephone Encounter (Signed)
Patient says he has a kidney infection and would like to know if we can call in Rx for this.  I advised him that he would most likely need an appointment, but he said he doesn't want to come in the office due to the flu and wanted me to ask anyway's.  Please advise.  [employer]

## 2016-10-08 NOTE — Telephone Encounter (Signed)
I would recommend that we give him an appointment in the morning to check his urine. Let the patient know that typically in the morning hours it is not during the main flu clinic which is typically in the afternoon hours

## 2016-10-08 NOTE — Telephone Encounter (Signed)
Patient states he is hurting in his kidneys and knows he has a kidney infection and would like antibiotic called in(Advised patient that we normally do not send in antibiotics without being seen)

## 2016-10-08 NOTE — Telephone Encounter (Signed)
Patient advised Dr Nicki Reaper would recommend that we give him an appointment in the morning to check his urine. Let the patient know that typically in the morning hours it is not during the main flu clinic which is typically in the afternoon hours. Patient verbalized understanding and scheduled follow up office visit.

## 2016-10-09 ENCOUNTER — Encounter: Payer: Self-pay | Admitting: Family Medicine

## 2016-10-09 ENCOUNTER — Ambulatory Visit (INDEPENDENT_AMBULATORY_CARE_PROVIDER_SITE_OTHER): Payer: Medicare HMO | Admitting: Family Medicine

## 2016-10-09 ENCOUNTER — Other Ambulatory Visit: Payer: Self-pay | Admitting: Family Medicine

## 2016-10-09 VITALS — BP 122/70 | Temp 98.5°F | Ht 73.0 in | Wt 259.5 lb

## 2016-10-09 DIAGNOSIS — Z125 Encounter for screening for malignant neoplasm of prostate: Secondary | ICD-10-CM | POA: Diagnosis not present

## 2016-10-09 DIAGNOSIS — E785 Hyperlipidemia, unspecified: Secondary | ICD-10-CM | POA: Diagnosis not present

## 2016-10-09 DIAGNOSIS — R10A0 Flank pain, unspecified side: Secondary | ICD-10-CM

## 2016-10-09 DIAGNOSIS — E119 Type 2 diabetes mellitus without complications: Secondary | ICD-10-CM

## 2016-10-09 DIAGNOSIS — R109 Unspecified abdominal pain: Secondary | ICD-10-CM

## 2016-10-09 LAB — POCT URINALYSIS DIPSTICK
Spec Grav, UA: 1.02
pH, UA: 5

## 2016-10-09 MED ORDER — CIPROFLOXACIN HCL 500 MG PO TABS: 500.0000 mg | ORAL_TABLET | Freq: Two times a day (BID) | ORAL | 0 refills | Status: DC

## 2016-10-09 NOTE — Progress Notes (Signed)
   Subjective:    Patient ID: Eugene [name], male    DOB: [birth year], 61 y.o.   MRN: [id number]  Flank Pain  This is a new problem. The current episode started 1 to 4 weeks ago. The quality of the pain is described as stabbing.  Patient relates intermittent pain on the left flank area does not radiate into the groin. Denies high fever chills sweats. Relates some urinary frequency. Patient in today for a possible kidney infections.   Results for orders placed or performed in visit on 10/09/16  POCT urinalysis dipstick  Result Value Ref Range   Color, UA Yellow    Clarity, UA     Glucose, UA     Bilirubin, UA     Ketones, UA     Spec Grav, UA 1.020    Blood, UA Moderate    pH, UA 5.0    Protein, UA     Urobilinogen, UA     Nitrite, UA     Leukocytes, UA Trace (A) Negative    Review of Systems  Genitourinary: Positive for flank pain.   Please see above. No vomiting or diarrhea no respiratory symptoms    Objective:   Physical Exam Lungs are clear heart rate is controlled pulse normal extremities no edema flank mild tenderness left flank to percussion right flank nontender abdomen soft Urinalysis with an occasional WBC and RBC       Assessment & Plan:  Flank pain urine culture is being sent antibiotics prescribed I recommend the patient follow-up within the next couple weeks we'll recheck him. Should be noted that the patient has office visit in a couple weeks time we will do a comprehensive checkup at that time I do not feel the patient needs CT scan currently if he starts having high fever chills sweats he is to follow-up immediately

## 2016-10-10 NOTE — Telephone Encounter (Signed)
Patient may have this plus 3 refills

## 2016-10-11 LAB — URINE CULTURE

## 2016-10-16 DIAGNOSIS — E119 Type 2 diabetes mellitus without complications: Secondary | ICD-10-CM | POA: Diagnosis not present

## 2016-10-16 DIAGNOSIS — E785 Hyperlipidemia, unspecified: Secondary | ICD-10-CM | POA: Diagnosis not present

## 2016-10-16 DIAGNOSIS — Z125 Encounter for screening for malignant neoplasm of prostate: Secondary | ICD-10-CM | POA: Diagnosis not present

## 2016-10-17 LAB — HEPATIC FUNCTION PANEL
ALK PHOS: 64 IU/L (ref 39–117)
ALT: 15 IU/L (ref 0–44)
AST: 21 IU/L (ref 0–40)
Albumin: 3.8 g/dL (ref 3.5–5.5)
BILIRUBIN, DIRECT: 0.13 mg/dL (ref 0.00–0.40)
Bilirubin Total: 0.3 mg/dL (ref 0.0–1.2)
TOTAL PROTEIN: 6.2 g/dL (ref 6.0–8.5)

## 2016-10-17 LAB — MICROALBUMIN / CREATININE URINE RATIO
Creatinine, Urine: 68.2 mg/dL
MICROALB/CREAT RATIO: 1934.2 — AB (ref 0.0–30.0)
Microalbumin, Urine: 1319.1 ug/mL

## 2016-10-17 LAB — BASIC METABOLIC PANEL
BUN / CREAT RATIO: 18 (ref 9–20)
BUN: 37 mg/dL — ABNORMAL HIGH (ref 6–24)
CHLORIDE: 100 mmol/L (ref 96–106)
CO2: 20 mmol/L (ref 18–29)
CREATININE: 2.09 mg/dL — AB (ref 0.76–1.27)
Calcium: 9.3 mg/dL (ref 8.7–10.2)
GFR calc Af Amer: 39 — ABNORMAL LOW (ref >59)
GFR calc non Af Amer: 34 — ABNORMAL LOW (ref >59)
GLUCOSE: 219 mg/dL — AB (ref 65–99)
Potassium: 4.7 mmol/L (ref 3.5–5.2)
SODIUM: 139 mmol/L (ref 134–144)

## 2016-10-17 LAB — LIPID PANEL
Chol/HDL Ratio: 6.6 — ABNORMAL HIGH (ref 0.0–5.0)
Cholesterol, Total: 204 mg/dL — ABNORMAL HIGH (ref 100–199)
HDL: 31 mg/dL — AB (ref >39)
LDL Calculated: 118 — ABNORMAL HIGH (ref 0–99)
TRIGLYCERIDES: 274 mg/dL — AB (ref 0–149)
VLDL Cholesterol Cal: 55 — ABNORMAL HIGH (ref 5–40)

## 2016-10-17 LAB — HEMOGLOBIN A1C
Est. average glucose Bld gHb Est-mCnc: 200
HEMOGLOBIN A1C: 8.6 % — AB (ref 4.8–5.6)

## 2016-10-17 LAB — PSA: Prostate Specific Ag, Serum: 1.1 ng/mL (ref 0.0–4.0)

## 2016-10-22 ENCOUNTER — Ambulatory Visit (INDEPENDENT_AMBULATORY_CARE_PROVIDER_SITE_OTHER): Payer: Medicare HMO | Admitting: Family Medicine

## 2016-10-22 ENCOUNTER — Encounter: Payer: Self-pay | Admitting: Family Medicine

## 2016-10-22 VITALS — BP 138/88 | Ht 73.0 in | Wt 259.0 lb

## 2016-10-22 DIAGNOSIS — E113393 Type 2 diabetes mellitus with moderate nonproliferative diabetic retinopathy without macular edema, bilateral: Secondary | ICD-10-CM

## 2016-10-22 DIAGNOSIS — I10 Essential (primary) hypertension: Secondary | ICD-10-CM

## 2016-10-22 DIAGNOSIS — E118 Type 2 diabetes mellitus with unspecified complications: Secondary | ICD-10-CM | POA: Diagnosis not present

## 2016-10-22 DIAGNOSIS — E1165 Type 2 diabetes mellitus with hyperglycemia: Secondary | ICD-10-CM

## 2016-10-22 DIAGNOSIS — IMO0002 Reserved for concepts with insufficient information to code with codable children: Secondary | ICD-10-CM

## 2016-10-22 DIAGNOSIS — N183 Chronic kidney disease, stage 3 unspecified: Secondary | ICD-10-CM

## 2016-10-22 DIAGNOSIS — Z794 Long term (current) use of insulin: Secondary | ICD-10-CM | POA: Diagnosis not present

## 2016-10-22 MED ORDER — GLIMEPIRIDE 1 MG PO TABS: ORAL_TABLET | ORAL | 5 refills | Status: DC

## 2016-10-22 NOTE — Progress Notes (Signed)
   Subjective:    Patient ID: Eugene [name], male    DOB: Oct 11, 1955, 61 y.o.   MRN: [id number]  Diabetes  He presents for his follow-up diabetic visit. He has type 2 diabetes mellitus.  A1C done on bloodwork. 8.6  Gout in right hand. This been bothering him for a few days he states he was moving a door and caught in it pulled on his wrist  Still having low back pain. Finished cipro. Low back pain on the left side worse with certain movements he feels it's related to the bed that he sleeps and states it's probably causing his to have back pain  Follow up bloodwork. Patient comes in today to have his lab work reviewed with him including his elevated A1c we also discussed his elevated creatinine and the importance of keeping blood pressure under control as well as keeping protein intake to a minimum we also discussed reducing his diuretic since he does not appear to have any type of CHF aspect going on right at the moment     Review of Systems Patient complains of mild right wrist pain also states some swelling in the ankles left worsening right denies wheezing shortness of breath denies excessive thirst some increased urination    Objective:   Physical Exam  Lungs are clear hearts regular no crackles extremities trace edema right ankle 1+ edema left ankle      Assessment & Plan:  HTN-patient to do a better job watching diet he is to get back in exercise continue current medication  Chronic kidney disease stage III-minimize protein intake. Cut back on diuretic. Repeat metabolic 7 in a few weeks-may need referral to nephrology if progressive illness  Poorly controlled diabetes patient not doing a good job with diet we talked about dietary choices and selections. Also use glimiperide 1 mg with meals essentially twice a day  Right wrist strain should gradually get better over time  Hyperlipidemia paced states he is not taking any type of cholesterol medicine states of all of him  make him feel bad he will discuss more with cardiology  Patient to follow-up in 3-4 months check A1c at that time

## 2016-10-25 ENCOUNTER — Other Ambulatory Visit: Payer: Self-pay | Admitting: *Deleted

## 2016-10-25 NOTE — Patient Outreach (Signed)
[facility]) Care Management  10/25/2016  [place] 12-26-1955 [id number]  Call received from Eugene [name] today. He is not currently active with [facility] but had a question about his medication and assistance.   Eugene [name] takes Levimir Insulin 80u qhs via Levimir Flex Pen. He has been paying $47.00/month for 10 pens and his recent cost increased to $94.00/month for 10 pens. He paid his deductible and the $94.00 at the pharmacy this past month. He has 3 pens on hand and says they should last about a week and a half. He is inquiring about any assistance or recommendations to help with the cost of his insulin.   Plan: I will reach out to our pharmacy team for guidance/assistance.    [facility]  [phone]

## 2016-10-26 ENCOUNTER — Other Ambulatory Visit: Payer: Self-pay | Admitting: Pharmacist

## 2016-10-26 ENCOUNTER — Other Ambulatory Visit: Payer: Self-pay | Admitting: *Deleted

## 2016-10-26 NOTE — Patient Outreach (Signed)
[facility]) Care Management  10/26/2016  [place] 1955/10/17 [id number]  Clyde Hill PharmD reached out to Mr. Atteberry regarding his medication management concerns. Please see her detailed note.   I left a message for Mr. Sheralyn Boatman asking him to please call if he had further needs/questions.   Plan: Case is closed. We will be available to assist with Mr. Philiippi's case management needs in the future.    [facility]  [phone]

## 2016-10-26 NOTE — Patient Outreach (Signed)
Receive a coordination of care call from [place] letting me know that [name] is having difficulty with affording his Levemir. Reports that per patient last month he was charged $94, rather than his usual $47 copay, for 10 pens of Levemir. Note that per the prescription, patient is to be taking 80 units daily. Note that based on this dose, 10 pens is a 37.5 day supply for the patient. Called and spoke with patient. HIPAA identifiers verified and verbal consent received.  [name] confirms the above information about his copayment for January. Explain to patient that, based on this dose, 10 pens is a 37.5 day supply and that this is causing him to be billed 2 months of copayment.  Call and speak with Durene Fruits at [facility]. Review the day supply concern with Durene Fruits and request that he bill the patient for 8 pens (30 day supply) for the upcoming month. Durene Fruits states that he will do this to ensure that patient receives the full 30 day supply for his $47 copay.  Call [name] and let him know that this month he will be able to pick up 8 pens for a $47 copay. [name] expresses appreciation for my help. Provide [name] with my phone number again. Patient states that he has no further medication questions/concerns at this time.  Will close pharmacy episode.  Harlow Asa, PharmD, [facility] [phone]

## 2016-11-02 ENCOUNTER — Inpatient Hospital Stay (HOSPITAL_COMMUNITY)
Admission: EM | Admit: 2016-11-02 | Discharge: 2016-11-04 | DRG: 291 | Disposition: A | Discharge status: Home / Self Care | Payer: Medicare HMO | Attending: Cardiology | Admitting: Cardiology

## 2016-11-02 ENCOUNTER — Emergency Department (HOSPITAL_COMMUNITY): Payer: Medicare HMO

## 2016-11-02 ENCOUNTER — Encounter (HOSPITAL_COMMUNITY): Payer: Self-pay

## 2016-11-02 DIAGNOSIS — R7989 Other specified abnormal findings of blood chemistry: Secondary | ICD-10-CM

## 2016-11-02 DIAGNOSIS — I130 Hypertensive heart and chronic kidney disease with heart failure and stage 1 through stage 4 chronic kidney disease, or unspecified chronic kidney disease: Secondary | ICD-10-CM | POA: Diagnosis not present

## 2016-11-02 DIAGNOSIS — R404 Transient alteration of awareness: Secondary | ICD-10-CM | POA: Diagnosis not present

## 2016-11-02 DIAGNOSIS — I5033 Acute on chronic diastolic (congestive) heart failure: Secondary | ICD-10-CM

## 2016-11-02 DIAGNOSIS — R531 Weakness: Secondary | ICD-10-CM | POA: Diagnosis not present

## 2016-11-02 DIAGNOSIS — I248 Other forms of acute ischemic heart disease: Secondary | ICD-10-CM | POA: Diagnosis not present

## 2016-11-02 DIAGNOSIS — I10 Essential (primary) hypertension: Secondary | ICD-10-CM | POA: Diagnosis not present

## 2016-11-02 DIAGNOSIS — F419 Anxiety disorder, unspecified: Secondary | ICD-10-CM | POA: Diagnosis present

## 2016-11-02 DIAGNOSIS — I214 Non-ST elevation (NSTEMI) myocardial infarction: Secondary | ICD-10-CM

## 2016-11-02 DIAGNOSIS — I509 Heart failure, unspecified: Secondary | ICD-10-CM | POA: Diagnosis not present

## 2016-11-02 DIAGNOSIS — I3139 Other pericardial effusion (noninflammatory): Secondary | ICD-10-CM | POA: Diagnosis present

## 2016-11-02 DIAGNOSIS — Z7982 Long term (current) use of aspirin: Secondary | ICD-10-CM

## 2016-11-02 DIAGNOSIS — I2510 Atherosclerotic heart disease of native coronary artery without angina pectoris: Secondary | ICD-10-CM | POA: Diagnosis present

## 2016-11-02 DIAGNOSIS — Z794 Long term (current) use of insulin: Secondary | ICD-10-CM

## 2016-11-02 DIAGNOSIS — K219 Gastro-esophageal reflux disease without esophagitis: Secondary | ICD-10-CM | POA: Diagnosis present

## 2016-11-02 DIAGNOSIS — E785 Hyperlipidemia, unspecified: Secondary | ICD-10-CM | POA: Diagnosis present

## 2016-11-02 DIAGNOSIS — Z7902 Long term (current) use of antithrombotics/antiplatelets: Secondary | ICD-10-CM

## 2016-11-02 DIAGNOSIS — Z833 Family history of diabetes mellitus: Secondary | ICD-10-CM | POA: Diagnosis not present

## 2016-11-02 DIAGNOSIS — I5043 Acute on chronic combined systolic (congestive) and diastolic (congestive) heart failure: Secondary | ICD-10-CM | POA: Diagnosis not present

## 2016-11-02 DIAGNOSIS — J449 Chronic obstructive pulmonary disease, unspecified: Secondary | ICD-10-CM | POA: Diagnosis present

## 2016-11-02 DIAGNOSIS — Z888 Allergy status to other drugs, medicaments and biological substances status: Secondary | ICD-10-CM | POA: Diagnosis not present

## 2016-11-02 DIAGNOSIS — Z885 Allergy status to narcotic agent status: Secondary | ICD-10-CM | POA: Diagnosis not present

## 2016-11-02 DIAGNOSIS — I110 Hypertensive heart disease with heart failure: Secondary | ICD-10-CM | POA: Diagnosis not present

## 2016-11-02 DIAGNOSIS — I2582 Chronic total occlusion of coronary artery: Secondary | ICD-10-CM | POA: Diagnosis not present

## 2016-11-02 DIAGNOSIS — Z6834 Body mass index (BMI) 34.0-34.9, adult: Secondary | ICD-10-CM

## 2016-11-02 DIAGNOSIS — Z8249 Family history of ischemic heart disease and other diseases of the circulatory system: Secondary | ICD-10-CM | POA: Diagnosis not present

## 2016-11-02 DIAGNOSIS — R778 Other specified abnormalities of plasma proteins: Secondary | ICD-10-CM

## 2016-11-02 DIAGNOSIS — I252 Old myocardial infarction: Secondary | ICD-10-CM | POA: Diagnosis not present

## 2016-11-02 DIAGNOSIS — I5023 Acute on chronic systolic (congestive) heart failure: Secondary | ICD-10-CM

## 2016-11-02 DIAGNOSIS — Z79899 Other long term (current) drug therapy: Secondary | ICD-10-CM

## 2016-11-02 DIAGNOSIS — R748 Abnormal levels of other serum enzymes: Secondary | ICD-10-CM | POA: Diagnosis not present

## 2016-11-02 DIAGNOSIS — I25118 Atherosclerotic heart disease of native coronary artery with other forms of angina pectoris: Secondary | ICD-10-CM | POA: Diagnosis not present

## 2016-11-02 DIAGNOSIS — R061 Stridor: Secondary | ICD-10-CM | POA: Diagnosis not present

## 2016-11-02 DIAGNOSIS — I313 Pericardial effusion (noninflammatory): Secondary | ICD-10-CM | POA: Diagnosis not present

## 2016-11-02 DIAGNOSIS — N184 Chronic kidney disease, stage 4 (severe): Secondary | ICD-10-CM | POA: Diagnosis present

## 2016-11-02 DIAGNOSIS — E1122 Type 2 diabetes mellitus with diabetic chronic kidney disease: Secondary | ICD-10-CM | POA: Diagnosis not present

## 2016-11-02 DIAGNOSIS — Z955 Presence of coronary angioplasty implant and graft: Secondary | ICD-10-CM | POA: Diagnosis not present

## 2016-11-02 DIAGNOSIS — R0602 Shortness of breath: Secondary | ICD-10-CM | POA: Diagnosis not present

## 2016-11-02 LAB — CREATININE, SERUM
Creatinine, Ser: 2.15 mg/dL — ABNORMAL HIGH (ref 0.61–1.24)
GFR calc non Af Amer: 32 mL/min — ABNORMAL LOW (ref >60)
GFR, EST AFRICAN AMERICAN: 37 mL/min — AB (ref >60)

## 2016-11-02 LAB — BASIC METABOLIC PANEL
ANION GAP: 8 (ref 5–15)
BUN: 46 mg/dL — AB (ref 6–20)
CALCIUM: 8.7 mg/dL — AB (ref 8.9–10.3)
CO2: 22 mmol/L (ref 22–32)
Chloride: 106 mmol/L (ref 101–111)
Creatinine, Ser: 2.15 mg/dL — ABNORMAL HIGH (ref 0.61–1.24)
GFR calc Af Amer: 37 mL/min — ABNORMAL LOW (ref >60)
GFR, EST NON AFRICAN AMERICAN: 32 mL/min — AB (ref >60)
GLUCOSE: 152 mg/dL — AB (ref 65–99)
Potassium: 4.4 mmol/L (ref 3.5–5.1)
SODIUM: 136 mmol/L (ref 135–145)

## 2016-11-02 LAB — BRAIN NATRIURETIC PEPTIDE: B Natriuretic Peptide: 721.9 pg/mL — ABNORMAL HIGH (ref 0.0–100.0)

## 2016-11-02 LAB — CBC WITH DIFFERENTIAL/PLATELET
BASOS ABS: 0 10*3/uL (ref 0.0–0.1)
BASOS PCT: 0 %
EOS PCT: 2 %
Eosinophils Absolute: 0.1 10*3/uL (ref 0.0–0.7)
HCT: 32.1 % — ABNORMAL LOW (ref 39.0–52.0)
Hemoglobin: 10.3 g/dL — ABNORMAL LOW (ref 13.0–17.0)
Lymphocytes Relative: 17 %
Lymphs Abs: 1 10*3/uL (ref 0.7–4.0)
MCH: 29.2 pg (ref 26.0–34.0)
MCHC: 32.1 g/dL (ref 30.0–36.0)
MCV: 90.9 fL (ref 78.0–100.0)
MONO ABS: 0.5 10*3/uL (ref 0.1–1.0)
Monocytes Relative: 9 %
NEUTROS ABS: 4.3 10*3/uL (ref 1.7–7.7)
Neutrophils Relative %: 72 %
PLATELETS: 197 10*3/uL (ref 150–400)
RBC: 3.53 MIL/uL — AB (ref 4.22–5.81)
RDW: 14 % (ref 11.5–15.5)
WBC: 6 10*3/uL (ref 4.0–10.5)

## 2016-11-02 LAB — I-STAT TROPONIN, ED: TROPONIN I, POC: 1.43 ng/mL — AB (ref 0.00–0.08)

## 2016-11-02 LAB — CBC
HEMATOCRIT: 34.7 % — AB (ref 39.0–52.0)
HEMOGLOBIN: 11.2 g/dL — AB (ref 13.0–17.0)
MCH: 29.4 pg (ref 26.0–34.0)
MCHC: 32.3 g/dL (ref 30.0–36.0)
MCV: 91.1 fL (ref 78.0–100.0)
Platelets: 247 10*3/uL (ref 150–400)
RBC: 3.81 MIL/uL — ABNORMAL LOW (ref 4.22–5.81)
RDW: 14.3 % (ref 11.5–15.5)
WBC: 8.7 10*3/uL (ref 4.0–10.5)

## 2016-11-02 LAB — MAGNESIUM: MAGNESIUM: 2.1 mg/dL (ref 1.7–2.4)

## 2016-11-02 LAB — TROPONIN I: TROPONIN I: 3.03 ng/mL — AB (ref <0.03)

## 2016-11-02 LAB — TSH: TSH: 1.585 u[IU]/mL (ref 0.350–4.500)

## 2016-11-02 MED ORDER — FUROSEMIDE 10 MG/ML IJ SOLN
80.0000 mg | Freq: Two times a day (BID) | INTRAMUSCULAR | Status: DC
  Administered 2016-11-03 – 2016-11-04 (×3): 80 mg via INTRAVENOUS
  Filled 2016-11-02 (×3): qty 8

## 2016-11-02 MED ORDER — HEPARIN SODIUM (PORCINE) 5000 UNIT/ML IJ SOLN
5000.0000 [IU] | Freq: Three times a day (TID) | INTRAMUSCULAR | Status: DC
  Administered 2016-11-02: 5000 [IU] via SUBCUTANEOUS
  Filled 2016-11-02: qty 1

## 2016-11-02 MED ORDER — ACETAMINOPHEN 500 MG PO TABS: 1000.0000 mg | ORAL_TABLET | Freq: Four times a day (QID) | ORAL | Status: DC | PRN

## 2016-11-02 MED ORDER — FENOFIBRATE 160 MG PO TABS
160.0000 mg | ORAL_TABLET | Freq: Every day | ORAL | Status: DC
  Administered 2016-11-02 – 2016-11-03 (×2): 160 mg via ORAL
  Filled 2016-11-02 (×2): qty 1

## 2016-11-02 MED ORDER — HEPARIN (PORCINE) IN NACL 100-0.45 UNIT/ML-% IJ SOLN
2300.0000 [IU]/h | INTRAMUSCULAR | Status: DC
  Administered 2016-11-03: 1700 [IU]/h via INTRAVENOUS
  Administered 2016-11-03: 2300 [IU]/h via INTRAVENOUS
  Administered 2016-11-03: 1450 [IU]/h via INTRAVENOUS
  Filled 2016-11-02 (×3): qty 250

## 2016-11-02 MED ORDER — ALLOPURINOL 100 MG PO TABS: 100.0000 mg | ORAL_TABLET | Freq: Every day | ORAL | Status: DC | PRN

## 2016-11-02 MED ORDER — METOPROLOL TARTRATE 50 MG PO TABS
50.0000 mg | ORAL_TABLET | Freq: Two times a day (BID) | ORAL | Status: DC
  Administered 2016-11-03 (×2): 50 mg via ORAL
  Filled 2016-11-02 (×2): qty 1

## 2016-11-02 MED ORDER — HEPARIN BOLUS VIA INFUSION
4000.0000 [IU] | Freq: Once | INTRAVENOUS | Status: AC
  Administered 2016-11-02: 4000 [IU] via INTRAVENOUS
  Filled 2016-11-02: qty 4000

## 2016-11-02 MED ORDER — ALPRAZOLAM 0.5 MG PO TABS
0.5000 mg | ORAL_TABLET | Freq: Every evening | ORAL | Status: DC | PRN
  Administered 2016-11-02: 0.5 mg via ORAL
  Filled 2016-11-02: qty 1

## 2016-11-02 MED ORDER — SODIUM CHLORIDE 0.9 % IV SOLN: 250.0000 mL | INTRAVENOUS | Status: DC | PRN

## 2016-11-02 MED ORDER — NITROGLYCERIN 0.4 MG SL SUBL: 0.4000 mg | SUBLINGUAL_TABLET | SUBLINGUAL | Status: DC | PRN

## 2016-11-02 MED ORDER — TRAMADOL HCL 50 MG PO TABS: 50.0000 mg | ORAL_TABLET | Freq: Four times a day (QID) | ORAL | Status: DC | PRN

## 2016-11-02 MED ORDER — ASPIRIN 81 MG PO CHEW
81.0000 mg | CHEWABLE_TABLET | Freq: Every day | ORAL | Status: DC
  Administered 2016-11-02 – 2016-11-03 (×2): 81 mg via ORAL
  Filled 2016-11-02 (×2): qty 1

## 2016-11-02 MED ORDER — GLIMEPIRIDE 1 MG PO TABS
1.0000 mg | ORAL_TABLET | Freq: Two times a day (BID) | ORAL | Status: DC
  Administered 2016-11-03 (×2): 1 mg via ORAL
  Filled 2016-11-02 (×3): qty 1

## 2016-11-02 MED ORDER — ONDANSETRON HCL 4 MG/2ML IJ SOLN: 4.0000 mg | Freq: Four times a day (QID) | INTRAMUSCULAR | Status: DC | PRN

## 2016-11-02 MED ORDER — SODIUM CHLORIDE 0.9% FLUSH
3.0000 mL | Freq: Two times a day (BID) | INTRAVENOUS | Status: DC
  Administered 2016-11-02 – 2016-11-03 (×3): 3 mL via INTRAVENOUS

## 2016-11-02 MED ORDER — CLOPIDOGREL BISULFATE 75 MG PO TABS
75.0000 mg | ORAL_TABLET | Freq: Every day | ORAL | Status: DC
  Administered 2016-11-02 – 2016-11-03 (×2): 75 mg via ORAL
  Filled 2016-11-02 (×2): qty 1

## 2016-11-02 MED ORDER — ACETAMINOPHEN 325 MG PO TABS: 650.0000 mg | ORAL_TABLET | ORAL | Status: DC | PRN

## 2016-11-02 MED ORDER — SODIUM CHLORIDE 0.9% FLUSH: 3.0000 mL | INTRAVENOUS | Status: DC | PRN

## 2016-11-02 MED ORDER — ISOSORBIDE MONONITRATE ER 30 MG PO TB24
30.0000 mg | ORAL_TABLET | Freq: Every day | ORAL | Status: DC
  Administered 2016-11-03 – 2016-11-04 (×2): 30 mg via ORAL
  Filled 2016-11-02 (×2): qty 1

## 2016-11-02 MED ORDER — FUROSEMIDE 10 MG/ML IJ SOLN
80.0000 mg | Freq: Once | INTRAMUSCULAR | Status: AC
Start: 2016-11-02 — End: 2016-11-02
  Administered 2016-11-02: 80 mg via INTRAVENOUS
  Filled 2016-11-02: qty 8

## 2016-11-02 MED ORDER — HYDRALAZINE HCL 50 MG PO TABS
50.0000 mg | ORAL_TABLET | Freq: Three times a day (TID) | ORAL | Status: DC
  Administered 2016-11-02 – 2016-11-03 (×4): 50 mg via ORAL
  Filled 2016-11-02 (×4): qty 1

## 2016-11-02 MED ORDER — COLCHICINE 0.6 MG PO TABS: 0.6000 mg | ORAL_TABLET | Freq: Two times a day (BID) | ORAL | Status: DC | PRN

## 2016-11-02 MED ORDER — ASPIRIN 81 MG PO CHEW
324.0000 mg | CHEWABLE_TABLET | Freq: Once | ORAL | Status: DC
  Filled 2016-11-02: qty 4

## 2016-11-02 NOTE — ED Provider Notes (Signed)
[facility] DEPT Provider Note   CSN: [id number] Arrival date & time: 11/02/16  1534     History   Chief Complaint Chief Complaint  Patient presents with  . Shortness of Breath    HPI Eugene [name] is a 61 y.o. male.  PT is a 61yo male with hx of CAD (s/p DES to RCA in 2012 and 2014), chronic diastolic CHF, HTN, HLD, and Type 2 DM who presents with shortness of breath. He states that since yesterday's had worsening shortness of breath and is only able to walk short distances without feeling short of breath. He denies any associated chest pain or tightness. He has some discomfort in his left flank area which seems to be worse with coughing. He denies any fevers. He had some vomiting 2 days ago but none since that time. No problems with urination. No productive cough. He has chronic swelling in his lower extremities, left worse than right which is at baseline for him. He states he's been taking all his medicines as directed.      Past Medical History:  Diagnosis Date  . Anginal pain ([place])   . Anxiety   . Arteriosclerotic cardiovascular disease (ASCVD)    a. 05/2011 Cath/PCI: LM nl, LAD 64m, D1 small, D2 small 85m, LCX large 40p, RCA 50-60p, 99 hazy @ origin of PDA with 70-80 in PDA (2.5x26 Resolute Integrity & 3.0x15 Resolute Integrity DES).;  b. 08/2012 Inflat  STEMI/Cath/PCI: LM minor irregs, LAD 50p, D1 50, LCX nl, OM1 25, RCA 30-40p, 100d (treated with 2.75x46mm Promus Premier DES);  c. 08/2012 Echo: EF 55-60%, basal inferopost HK.  . Asthma   . Bell palsy   . C. difficile colitis    a. 08/2012  . CHF (congestive heart failure) ([place])   . Cholelithiasis 07/2012   Asymptomatic; identified incidentally  . Contrast dye induced nephropathy    a. 08/2012 post cath/pci  . COPD (chronic obstructive pulmonary disease) ([place])   . Diabetes mellitus    Peripheral neuropathy  . Gallstones   . GERD (gastroesophageal reflux disease)   . Heart disease   . Hyperlipidemia   .  Hypertension   . Myocardial infarct 09/08/12  . Nephrolithiasis   . Old myocardial infarction   . PONV (postoperative nausea and vomiting)   . Sleep apnea     Patient Active Problem List   Diagnosis Date Noted  . Moderate nonproliferative diabetic retinopathy of both eyes without macular edema associated with type 2 diabetes mellitus ([place]) 10/22/2016  . Hyperkalemia 06/14/2016  . Non-ST elevation myocardial infarction (NSTEMI) ([place])   . Morbid obesity ([place])   . Diabetes mellitus type 2, uncontrolled, with complications ([place]) 57/84/6962  . Right carotid bruit 11/10/2015  . Pain in the chest   . Severe hypertension 11/09/2015  . Hypertensive urgency 11/09/2015  . Diastolic CHF ([place]) 95/28/4132  . Gout of wrist 08/08/2015  . Pericardial effusion without cardiac tamponade 05/26/2015  . HLD (hyperlipidemia) 05/26/2015  . Anxiety 05/26/2015  . Olecranon bursitis   . Inflammatory arthritis   . Elbow joint pain 01/21/2015  . SOB (shortness of breath) on exertion 12/26/2014  . Atypical chest pain 12/25/2014  . Constipation 10/26/2014  . Abdominal pain 10/26/2014  . Peripheral edema 01/29/2014  . Acute on chronic diastolic heart failure ([place]) 01/26/2014  . Acute on chronic renal failure ([place]) 01/26/2014  . Chronic renal insufficiency, stage III (moderate) 12/14/2013  . Chest pain 12/13/2013  . SOB (shortness of breath) 10/16/2013  . Dyspnea  10/15/2013  . Chronic diastolic CHF (congestive heart failure) ([place]) 10/15/2013  . NSVT (nonsustained ventricular tachycardia) ([place]) 06/26/2013  . Type 2 diabetes mellitus with neurological manifestations ([place]) 06/26/2013  . Acute diastolic CHF (congestive heart failure) ([place]) 06/26/2013  . Pericardial effusion 06/26/2013  . Old myocardial infarction   . Gastroparesis due to DM ([place]) 05/21/2013  . CAD (coronary artery disease) 09/16/2012  . Acute on chronic renal insufficiency 09/14/2012  . Acute systolic CHF (congestive heart failure), NYHA  class 3 ([place]) 09/14/2012  . Edema 08/18/2012  . Anemia, normocytic normochromic 07/31/2012  . Cholelithiasis 07/27/2012  . Gastroesophageal reflux disease 05/10/2012  . Noncompliance 05/09/2012  . Peripheral neuropathy ([place]) 10/24/2011  . Obstructive sleep apnea- on C-pap 08/23/2011  . Hyponatremia 08/23/2011  . CAD- MI/RCA PCI-DES x2 2012, and RCA DES Jan 2014   . Hyperlipidemia   . Cholelithiasis 05/28/2011  . DM type 2 (diabetes mellitus, type 2) ([place]) 05/25/2011  . Essential hypertension 05/25/2011    Past Surgical History:  Procedure Laterality Date  . CARDIAC CATHETERIZATION    . CHEST TUBE INSERTION    . CIRCUMCISION    . COLONOSCOPY     In [facility], approximately 2011 per patient, was normal. Advised to come back in 10 years.  . ESOPHAGOGASTRODUODENOSCOPY     in [place] over 20 yrs ago  . ESOPHAGOGASTRODUODENOSCOPY  06/12/2012   TOI:ZTIWPY esophagus-status post Venia Minks dilation. Abnormal gastric mucosa of uncertain significance-status post biopsy  . INTRAOPERATIVE TRANSESOPHAGEAL ECHOCARDIOGRAM N/A 06/23/2013   Procedure: INTRAOPERATIVE TRANSESOPHAGEAL ECHOCARDIOGRAM;  Surgeon: Grace Isaac, MD;  Location: [place] [facility];  Service: Open Heart Surgery;  Laterality: N/A;  . LEFT HEART CATHETERIZATION WITH CORONARY ANGIOGRAM N/A 09/08/2012   Procedure: LEFT HEART CATHETERIZATION WITH CORONARY ANGIOGRAM;  Surgeon: [name], MD;  Location[facility] CATH LAB;  Service: Cardiovascular;  Laterality: N/A;  . stents    . SUBXYPHOID PERICARDIAL WINDOW N/A 06/23/2013   Procedure: SUBXYPHOID PERICARDIAL WINDOW;  Surgeon: Grace Isaac, MD;  Location: [facility] OR;  Service: Thoracic;  Laterality: N/A;       Home Medications    Prior to Admission medications   Medication Sig Start Date End Date Taking? Authorizing Provider  acetaminophen (TYLENOL) 500 MG tablet Take 1,000 mg by mouth every 6 (six) hours as needed for headache (pain).   Yes Historical Provider, MD    allopurinol (ZYLOPRIM) 100 MG tablet Take 100 mg by mouth daily as needed (gout attack).   Yes Historical Provider, MD  ALPRAZolam (XANAX) 0.5 MG tablet TAKE 1/2-1 TABLET BY MOUTH AT BEDTIME AS NEEDED Patient taking differently: TAKE 1/2-1 TABLET BY MOUTH AT BEDTIME AS NEEDED FOR SLEEP 10/01/16  Yes [name], MD  aspirin 81 MG chewable tablet Chew 81 mg by mouth at bedtime.   Yes Historical Provider, MD  clopidogrel (PLAVIX) 75 MG tablet TAKE 1 TABLET AT BEDTIME 09/07/16  Yes [name], MD  colchicine 0.6 MG tablet Take 0.6 mg by mouth 2 (two) times daily as needed (gout attack).   Yes Historical Provider, MD  fenofibrate 160 MG tablet TAKE 1 TABLET AT BEDTIME 04/09/16  Yes [name], MD  glimepiride (AMARYL) 1 MG tablet Take twice a day with meals Patient taking differently: Take 1 mg by mouth 2 (two) times daily with a meal.  10/22/16  Yes [name], MD  hydrALAZINE (APRESOLINE) 50 MG tablet Take 1 tablet (50 mg total) by mouth 2 (two) times daily. 09/04/16  Yes Scott A  [name], MD  Insulin Detemir (LEVEMIR FLEXPEN) 100 UNIT/ML Pen Inject 80 Units into the skin daily at 10 pm. Patient taking differently: Inject 60 Units into the skin at bedtime.  09/03/16  Yes [name], MD  isosorbide mononitrate (IMDUR) 30 MG 24 hr tablet Take 1 tablet (30 mg total) by mouth daily. 06/16/16  Yes Erma Heritage, PA  metoprolol tartrate (LOPRESSOR) 25 MG tablet Take 50 mg by mouth 2 (two) times daily with a meal. 08/30/16  Yes Historical Provider, MD  nitroGLYCERIN (NITROSTAT) 0.4 MG SL tablet Place 1 tablet (0.4 mg total) under the tongue every 5 (five) minutes x 3 doses as needed for chest pain. 06/15/16  Yes Erma Heritage, PA  OVER THE COUNTER MEDICATION Place 1 drop into both eyes 2 (two) times daily as needed (dry eyes). Over the counter lubricating eye drop   Yes Historical Provider, MD  [place] into the lungs at bedtime. CPAP   Yes Historical Provider, MD   torsemide (DEMADEX) 20 MG tablet Take 10 mg by mouth daily.    Yes Historical Provider, MD  traMADol (ULTRAM) 50 MG tablet TAKE 1 TABLET BY MOUTH EVERY 6 HOURS AS NEEDED FOR PAIN. 10/10/16  Yes [name], MD  metoprolol tartrate (LOPRESSOR) 50 MG tablet Take 1 tablet (50 mg total) by mouth 2 (two) times daily. Patient not taking: Reported on 11/02/2016 06/15/16   Erma Heritage, PA    Family History Family History  Problem Relation Age of Onset  . Diabetes Mother   . Heart attack Mother   . Stroke Mother   . Diabetes Sister   . Sleep apnea Sister   . Hypertension Brother   . Diabetes Brother   . Diabetes Brother   . Hypertension Brother   . Colon cancer Neg Hx   . Liver disease Neg Hx     Social History Social History  Substance Use Topics  . Smoking status: Never Smoker  . Smokeless tobacco: Never Used  . Alcohol use No     Comment: heavy etoh use 30 years ago     Allergies   Hydrocodone; Lisinopril; Neurontin [gabapentin]; Statins; Metformin and related; and Norvasc [amlodipine besylate]   Review of Systems Review of Systems  Constitutional: Positive for fatigue. Negative for chills, diaphoresis and fever.  HENT: Negative for congestion, rhinorrhea and sneezing.   Eyes: Negative.   Respiratory: Positive for cough and shortness of breath. Negative for chest tightness.   Cardiovascular: Positive for leg swelling. Negative for chest pain.  Gastrointestinal: Negative for abdominal pain, blood in stool, diarrhea, nausea and vomiting.  Genitourinary: Positive for flank pain. Negative for difficulty urinating, frequency and hematuria.  Musculoskeletal: Negative for arthralgias and back pain.  Skin: Negative for rash.  Neurological: Negative for dizziness, speech difficulty, weakness, numbness and headaches.     Physical Exam Updated Vital Signs BP 161/97   Pulse 85   Resp 22   SpO2 99%   Physical Exam  Constitutional: He is oriented to person, place, and  time. He appears well-developed and well-nourished.  HENT:  Head: Normocephalic and atraumatic.  Eyes: Pupils are equal, round, and reactive to light.  Neck: Normal range of motion. Neck supple.  Cardiovascular: Normal rate, regular rhythm and normal heart sounds.   Pulmonary/Chest: Effort normal and breath sounds normal. No respiratory distress. He has no wheezes. He has no rales. He exhibits no tenderness.  Abdominal: Soft. Bowel sounds are normal. There is no tenderness. There  is no rebound and no guarding.  Patient has an area of ecchymosis to his left abdominal wall. He states this is where he gives his insulin injection. He denies any tenderness on palpation of the abdomen although the pressure on the abdomen, his cough and shortness of breath increases.  Musculoskeletal: Normal range of motion. He exhibits edema (2+ pitting edema bilaterally).  Lymphadenopathy:    He has no cervical adenopathy.  Neurological: He is alert and oriented to person, place, and time.  Skin: Skin is warm and dry. No rash noted.  Psychiatric: He has a normal mood and affect.     ED Treatments / Results  Labs (all labs ordered are listed, but only abnormal results are displayed) Labs Reviewed  BASIC METABOLIC PANEL - Abnormal; Notable for the following:       Result Value   Glucose, Bld 152 (*)    BUN 46 (*)    Creatinine, Ser 2.15 (*)    Calcium 8.7 (*)    GFR calc non Af Amer 32 (*)    GFR calc Af Amer 37 (*)    All other components within normal limits  CBC WITH DIFFERENTIAL/PLATELET - Abnormal; Notable for the following:    RBC 3.53 (*)    Hemoglobin 10.3 (*)    HCT 32.1 (*)    All other components within normal limits  BRAIN NATRIURETIC PEPTIDE - Abnormal; Notable for the following:    B Natriuretic Peptide 721.9 (*)    All other components within normal limits  I-STAT TROPOININ, ED - Abnormal; Notable for the following:    Troponin i, poc 1.43 (*)    All other components within normal  limits    EKG  EKG Interpretation  Date/Time:  Friday November 02 2016 15:41:51 EST Ventricular Rate:  87 PR Interval:    QRS Duration: 107 QT Interval:  373 QTC Calculation: 449 R Axis:   88 Text Interpretation:  Sinus rhythm Prolonged PR interval Probable anteroseptal infarct, recent slight increase in elevation in V2/V3 Confirmed by Belinda Schlichting  MD, Sherion Dooly ([id number]) on 11/02/2016 5:53:25 PM       Radiology Dg Chest 2 View  Result Date: 11/02/2016 CLINICAL DATA:  Shortness of breath. EXAM: CHEST  2 VIEW COMPARISON:  Two-view chest x-ray 11/09/2015. FINDINGS: The heart is enlarged. There is a mild diffuse interstitial pattern compatible with edema. Small effusions are present. Mild dependent atelectasis is present bilaterally without other focal airspace disease. The visualized soft tissues and bony thorax are unremarkable. IMPRESSION: 1. Cardiomegaly with mild edema and small effusions compatible with congestive heart failure. 2. No significant focal airspace disease. Electronically Signed   By: San Morelle M.D.   On: 11/02/2016 16:27    Procedures Procedures (including critical care time)  Medications Ordered in ED Medications  aspirin chewable tablet 324 mg (not administered)  furosemide (LASIX) injection 80 mg (80 mg Intravenous Given 11/02/16 1801)     Initial Impression / Assessment and Plan / ED Course  I have reviewed the triage vital signs and the nursing notes.  Pertinent labs & imaging results that were available during my care of the patient were reviewed by me and considered in my medical decision making (see chart for details).     Patient has evidence of fluid overload. His troponin is elevated but he has no associated chest pain. His EKG doesn't show acute ischemic changes. His EKG was compared to an EKG from 06/14/2016 and it appears to be unchanged. I spoke with  Dr. Fransico Him who will see the patient.  Pt was given ASA per EMS.  Given lasix in the ED and is  starting to diurese. His oxygen saturations are 89-90% on room air and has been placed on a nasal cannula.  Final Clinical Impressions(s) / ED Diagnoses   Final diagnoses:  Acute on chronic systolic congestive heart failure (HCC)  NSTEMI (non-ST elevated myocardial infarction) [facility])    New Prescriptions New Prescriptions   No medications on file     Malvin Johns, MD 11/02/16 1847

## 2016-11-02 NOTE — H&P (Signed)
Patient ID: Eugene [name] MRN: [id number], DOB/AGE: 60-May-1957   Admit date: 11/02/2016  Primary Physician: [name], MD Primary Cardiologist: Dr. Bronson Ing Reason for admission: CHF  HPI:  Eugene [name] is a 61 y.o. male with a history of chronic diastolic heart failure, CKD, HLD, DMT2, malignant hypertension, chronic pericardial effusion s/p pericardial window (2014) and CAD s/p DES to [facility] and PDA (2012), STEMI s/p DES to dRCA (2014) who presented to [facility] today with SOB   He is last cath was in 08/2012 in the setting of an acute inferior STEMI 2/2 total occlusion of the distal RCA, treated successfully with primary PCI with diffuse distal vessel disease of the LAD and moderate proximal LAD stenosis, minor non obst LCx stenosis, moderate segmental LV dysfunction with an LVEF of 45% and inferior wall hypokinesis.   Echocardiogram on 05/26/15 demonstrated normal left ventricular systolic function and regional wall motion, LVEF 60-65%, severe LVH, grade 2 diastolic dysfunction, moderate left atrial and mild right atrial dilatation, dilated IVC, and a mild to moderate circumferential pericardial effusion with no evidence of tamponade physiology.  He was admitted to [facility] 10/19-10/20/17 for chest pain in the setting of hypertensive urgency. His troponin was slightly elevated but then became chest pain free so he was discharged home.   He was last seen by Dr. Bronson Ing on 04/2016. He started him on Zetia given intolerance to statins due to myalgias. Plans for PCSK9 I therapy in future.   He was in his usual state of health until last week when he saw his PCP, Dr. Wolfgang [name], who decreased his Torsemide from 20mg  / 10mg  alternating every other day to Torsemide 10mg  daily. Patient stated he had a kidney infection and was on abx. Per review of Dr. Lance Sell note, he decreased torsemide given worsening renal function. Since that time, he has progressive LE edema, abdominal distension and  SOB. He denies any chest pain. No palpations. He has occasional dizziness but no syncope. NO blood in stool or urine.     Problem List  Past Medical History:  Diagnosis Date  . Anginal pain ([place])   . Anxiety   . Arteriosclerotic cardiovascular disease (ASCVD)    a. 05/2011 Cath/PCI: LM nl, LAD 75m, D1 small, D2 small 61m, LCX large 40p, RCA 50-60p, 99 hazy @ origin of PDA with 70-80 in PDA (2.5x26 Resolute Integrity & 3.0x15 Resolute Integrity DES).;  b. 08/2012 Inflat  STEMI/Cath/PCI: LM minor irregs, LAD 50p, D1 50, LCX nl, OM1 25, RCA 30-40p, 100d (treated with 2.75x49mm Promus Premier DES);  c. 08/2012 Echo: EF 55-60%, basal inferopost HK.  . Asthma   . Bell palsy   . C. difficile colitis    a. 08/2012  . CHF (congestive heart failure) ([place])   . Cholelithiasis 07/2012   Asymptomatic; identified incidentally  . Contrast dye induced nephropathy    a. 08/2012 post cath/pci  . COPD (chronic obstructive pulmonary disease) ([place])   . Diabetes mellitus    Peripheral neuropathy  . Gallstones   . GERD (gastroesophageal reflux disease)   . Heart disease   . Hyperlipidemia   . Hypertension   . Myocardial infarct 09/08/12  . Nephrolithiasis   . Old myocardial infarction   . PONV (postoperative nausea and vomiting)   . Sleep apnea     Past Surgical History:  Procedure Laterality Date  . CARDIAC CATHETERIZATION    . CHEST TUBE INSERTION    . CIRCUMCISION    . COLONOSCOPY  In [place], approximately 2011 per patient, was normal. Advised to come back in 10 years.  . ESOPHAGOGASTRODUODENOSCOPY     [place] over 20 yrs ago  . ESOPHAGOGASTRODUODENOSCOPY  06/12/2012   QTM:[id number] esophagus-status post Venia Minks dilation. Abnormal gastric mucosa of uncertain significance-status post biopsy  . INTRAOPERATIVE TRANSESOPHAGEAL ECHOCARDIOGRAM N/A 06/23/2013   Procedure: INTRAOPERATIVE TRANSESOPHAGEAL ECHOCARDIOGRAM;  Surgeon: Grace Isaac, MD;  Location: [place];  Service:  Open Heart Surgery;  Laterality: N/A;  . LEFT HEART CATHETERIZATION WITH CORONARY ANGIOGRAM N/A 09/08/2012   Procedure: LEFT HEART CATHETERIZATION WITH CORONARY ANGIOGRAM;  Surgeon: [name], MD;  Location[facility] CATH LAB;  Service: Cardiovascular;  Laterality: N/A;  . stents    . SUBXYPHOID PERICARDIAL WINDOW N/A 06/23/2013   Procedure: SUBXYPHOID PERICARDIAL WINDOW;  Surgeon: Grace Isaac, MD;  Location: [facility] OR;  Service: Thoracic;  Laterality: N/A;     Allergies  Allergies  Allergen Reactions  . Hydrocodone Nausea And Vomiting  . Lisinopril Cough  . Neurontin [Gabapentin] Other (See Comments)    Suicidal thoughts  . Statins Other (See Comments)    Muscle aches  . Metformin And Related Diarrhea and Other (See Comments)    Intestinal side effects  . Norvasc [Amlodipine Besylate] Swelling    Pedal edema     Home Medications  Prior to Admission medications   Medication Sig Start Date End Date Taking? Authorizing Provider  acetaminophen (TYLENOL) 500 MG tablet Take 1,000 mg by mouth every 6 (six) hours as needed for headache (pain).   Yes Historical Provider, MD  allopurinol (ZYLOPRIM) 100 MG tablet Take 100 mg by mouth daily as needed (gout attack).   Yes Historical Provider, MD  ALPRAZolam (XANAX) 0.5 MG tablet TAKE 1/2-1 TABLET BY MOUTH AT BEDTIME AS NEEDED Patient taking differently: TAKE 1/2-1 TABLET BY MOUTH AT BEDTIME AS NEEDED FOR SLEEP 10/01/16  Yes [name], MD  aspirin 81 MG chewable tablet Chew 81 mg by mouth at bedtime.   Yes Historical Provider, MD  clopidogrel (PLAVIX) 75 MG tablet TAKE 1 TABLET AT BEDTIME 09/07/16  Yes [name], MD  colchicine 0.6 MG tablet Take 0.6 mg by mouth 2 (two) times daily as needed (gout attack).   Yes Historical Provider, MD  fenofibrate 160 MG tablet TAKE 1 TABLET AT BEDTIME 04/09/16  Yes [name], MD  glimepiride (AMARYL) 1 MG tablet Take twice a day with meals Patient taking differently: Take 1 mg by mouth 2 (two)  times daily with a meal.  10/22/16  Yes [name], MD  hydrALAZINE (APRESOLINE) 50 MG tablet Take 1 tablet (50 mg total) by mouth 2 (two) times daily. 09/04/16  Yes [name], MD  Insulin Detemir (LEVEMIR FLEXPEN) 100 UNIT/ML Pen Inject 80 Units into the skin daily at 10 pm. Patient taking differently: Inject 60 Units into the skin at bedtime.  09/03/16  Yes [name], MD  isosorbide mononitrate (IMDUR) 30 MG 24 hr tablet Take 1 tablet (30 mg total) by mouth daily. 06/16/16  Yes Erma Heritage, PA  metoprolol tartrate (LOPRESSOR) 25 MG tablet Take 50 mg by mouth 2 (two) times daily with a meal. 08/30/16  Yes Historical Provider, MD  nitroGLYCERIN (NITROSTAT) 0.4 MG SL tablet Place 1 tablet (0.4 mg total) under the tongue every 5 (five) minutes x 3 doses as needed for chest pain. 06/15/16  Yes Erma Heritage, PA  OVER THE COUNTER MEDICATION Place 1 drop into both eyes 2 (  two) times daily as needed (dry eyes). Over the counter lubricating eye drop   Yes Historical Provider, MD  [place] into the lungs at bedtime. CPAP   Yes Historical Provider, MD  torsemide (DEMADEX) 20 MG tablet Take 10 mg by mouth daily.    Yes Historical Provider, MD  traMADol (ULTRAM) 50 MG tablet TAKE 1 TABLET BY MOUTH EVERY 6 HOURS AS NEEDED FOR PAIN. 10/10/16  Yes [name], MD  metoprolol tartrate (LOPRESSOR) 50 MG tablet Take 1 tablet (50 mg total) by mouth 2 (two) times daily. Patient not taking: Reported on 11/02/2016 06/15/16   Erma Heritage, PA    Family History  Family History  Problem Relation Age of Onset  . Diabetes Mother   . Heart attack Mother   . Stroke Mother   . Diabetes Sister   . Sleep apnea Sister   . Hypertension Brother   . Diabetes Brother   . Diabetes Brother   . Hypertension Brother   . Colon cancer Neg Hx   . Liver disease Neg Hx       Family Status  Relation Status  . Mother Deceased  . Sister Deceased  . Brother Alive  . Father Deceased    . Brother Alive  . Neg Hx      Social History  Social History   Social History  . Marital status: Married    Spouse name: N/A  . Number of children: 2  . Years of education: N/A   Occupational History  . Shipping [employer]   Social History Main Topics  . Smoking status: Never Smoker  . Smokeless tobacco: Never Used  . Alcohol use No     Comment: heavy etoh use 30 years ago  . Drug use: No  . Sexual activity: Yes    Birth control/ protection: None   Other Topics Concern  . 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 Disabled at this point.     Review of Systems General:  No chills, fever, night sweats or weight changes.  Cardiovascular:  No chest pain, ++dyspnea on exertion, ++edema, +++orthopnea, +++palpitations, +++ paroxysmal nocturnal dyspnea. Dermatological: No rash, lesions/masses Respiratory: No cough, dyspnea Urologic: No hematuria, dysuria Abdominal:   No nausea, vomiting, diarrhea, bright red blood per rectum, melena, or hematemesis Neurologic:  No visual changes, wkns, changes in mental status. All other systems reviewed and are otherwise negative except as noted above.  Physical Exam  Blood pressure 161/97, pulse 85, resp. rate 22, SpO2 99 %.  General: Pleasant, NAD,obese Psych: Normal affect. Neuro: Alert and oriented X 3. Moves all extremities spontaneously. HEENT: Normal  Neck: Supple without bruits. I could not appreciated JVD. Lungs:  Resp regular and unlabored, decreased breath sounds at bases Heart: RRR no s3, s4, or murmurs. Abdomen: Soft, non-tender, +++distended, BS + x 4.  Extremities: No clubbing, cyanosis or edema. DP/PT/Radials 2+ and equal bilaterally.  Labs  No results for input(s): CKTOTAL, CKMB, TROPONINI in the last 72 hours. Lab Results  Component Value Date   WBC 6.0 11/02/2016   HGB 10.3 (L) 11/02/2016   HCT 32.1 (L) 11/02/2016   MCV 90.9  11/02/2016   PLT 197 11/02/2016    Recent Labs Lab 11/02/16 1605  NA 136  K 4.4  CL 106  CO2 22  BUN 46*  CREATININE 2.15*  CALCIUM 8.7*  GLUCOSE 152*  Lab Results  Component Value Date   CHOL 204 (H) 10/16/2016   HDL 31 (L) 10/16/2016   LDLCALC 118 (H) 10/16/2016   TRIG 274 (H) 10/16/2016   Lab Results  Component Value Date   DDIMER 0.58 (H) 05/26/2015     Radiology/Studies  Dg Chest 2 View  Result Date: 11/02/2016 CLINICAL DATA:  Shortness of breath. EXAM: CHEST  2 VIEW COMPARISON:  Two-view chest x-ray 11/09/2015. FINDINGS: The heart is enlarged. There is a mild diffuse interstitial pattern compatible with edema. Small effusions are present. Mild dependent atelectasis is present bilaterally without other focal airspace disease. The visualized soft tissues and bony thorax are unremarkable. IMPRESSION: 1. Cardiomegaly with mild edema and small effusions compatible with congestive heart failure. 2. No significant focal airspace disease. Electronically Signed   By: San Morelle M.D.   On: 11/02/2016 16:27    CATH 08/2012 Final Conclusions:   1. Acute inferior MI secondary to total occlusion of the distal RCA, treated successfully with primary PCI 2. Diffuse distal vessel disease of the LAD and moderate proximal LAD stenosis 3. Minor nonobstructive LCx stenosis 4. Moderate segmental LV dysfunction with an LVEF of 45% and inferior wall hypokinesis  Recommendations:  ASA/Effient at least 12 months. Consider long-term dual antiplatelet Rx if tolerated.   2D ECHO: 11/10/2015 LV EF: 55% -   60% Study Conclusions - Left ventricle: The cavity size was normal. Wall thickness was   increased in a pattern of moderate to severe LVH. Systolic   function was normal. The estimated ejection fraction was in the   range of 55% to 60%. Wall motion was normal; there were no   regional wall motion abnormalities. Features are consistent with   a pseudonormal left ventricular  filling pattern, with concomitant   abnormal relaxation and increased filling pressure (grade 2   diastolic dysfunction). - Aortic root: The aortic root was mildly dilated. - Mitral valve: There was trivial regurgitation. - Left atrium: The atrium was mildly dilated. - Right atrium: Central venous pressure (est): 15 mm Hg. - Atrial septum: No defect or patent foramen ovale was identified. - Tricuspid valve: There was trivial regurgitation. - Pulmonary arteries: Systolic pressure could not be accurately   estimated. - Pericardium, extracardiac: Circumferential pericardial effusion   noted. There is a small collection anteriorly with large   collection posterior to the heart and also right atrium. No   obvious RV compromise to indicate tamponade physiology. Effusion   is larger compared to the previous study in September 2016. Impressions: - Moderate to severe LVH with LVEF 55-60%. Grade 2 diastolic   dysfunction with increased LV filling pressure. Mild left atrial   enlargement. Mildly dilated aortic root. Trivial tricuspid   regurgitation, unable to assess PASP.Circumferential pericardial   effusion noted. There is a small collection anteriorly with large   collection posterior to the heart and also right atrium. No   obvious RV compromise to indicate tamponade physiology. Effusion   appears larger compared to the previous study in September 2016   ECG  Sinus with prolonged PR, TWI in lead I and AVL actually improved from previous  [place] is a 61 y.o. male with a history of chronic diastolic heart failure, CKD, HLD, DMT2, malignant hypertension, chronic pericardial effusion s/p pericardial window  (2014) and CAD s/p DES to [facility] and PDA (2012), STEMI s/p DES to dRCA (2014) who presented to [facility] today with SOB.  Acute on chronic diastolic CHF:  CXR shows CHF and BNP 721. Recently home torsemide was decreased by PCP given progressive renal insuffiencey.  Interestingly, weight is stable from that visit at 259 lbs and better than when he saw Dr. Bronson Ing back in 04/2016. Will start IV lasix 80mg  BID and continue to monitor. Will update 2D ECHO  Elevated troponin: troponin up to 1.43. This is a little higher than we would expect with demand ischemia, but creat up to 2. ECG with no acute ST or TW changes. He has had no chest pain. Will continue to cycle. If it continues to increase, we may need to start IV heparin.   HTN: BP elevated today to 168/96. Will increase hydralazine from 50mg  BID to TID.    CKD: creat 2.15, which is a little elevated from his baseline ~2.   Pericardial effusion s/p pericardial window: will update 2D ECHO  CAD: continue ASA/plavix, BB. Statin intolerant  Signed, Eugene Form, PA-C 11/02/2016, 6:33 PM  Pager [phone]

## 2016-11-02 NOTE — ED Triage Notes (Signed)
Patient from home and having shortness of breath and a cough since yesterday.  Lower lungs decreased, no wheezing noted. 90% on room air put on 2L.  History of MI and 3 stents.  States with his MI this is the way he felt.

## 2016-11-02 NOTE — ED Notes (Signed)
Patient transported to X-ray 

## 2016-11-02 NOTE — Progress Notes (Addendum)
ANTICOAGULATION CONSULT NOTE - Initial Consult  Pharmacy Consult for Heparin Indication: chest pain/ACS  Allergies  Allergen Reactions  . Hydrocodone Nausea And Vomiting  . Lisinopril Cough  . Neurontin [Gabapentin] Other (See Comments)    Suicidal thoughts  . Statins Other (See Comments)    Muscle aches  . Metformin And Related Diarrhea and Other (See Comments)    Intestinal side effects  . Norvasc [Amlodipine Besylate] Swelling    Pedal edema    Patient Measurements: Height: 6\' 1"  (185.4 cm) Weight: 261 lb 4.8 oz (118.5 kg) (scale c) IBW/kg (Calculated) : 79.9 Heparin Dosing Weight: 104 kg  Vital Signs: Temp: 98.3 F (36.8 C) (03/09 2130) Temp Source: Oral (03/09 2130) BP: 171/89 (03/09 2130) Pulse Rate: 88 (03/09 2130)  Labs:  Recent Labs  11/02/16 1605 11/02/16 2158  HGB 10.3* 11.2*  HCT 32.1* 34.7*  PLT 197 247  CREATININE 2.15* 2.15*  TROPONINI  --  3.03*    Estimated Creatinine Clearance: 49.9 mL/min (by C-G formula based on SCr of 2.15 mg/dL (H)).   Medical History: Past Medical History:  Diagnosis Date  . Anginal pain ([place])   . Anxiety   . Arteriosclerotic cardiovascular disease (ASCVD)    a. 05/2011 Cath/PCI: LM nl, LAD 52m, D1 small, D2 small 55m, LCX large 40p, RCA 50-60p, 99 hazy @ origin of PDA with 70-80 in PDA (2.5x26 Resolute Integrity & 3.0x15 Resolute Integrity DES).;  b. 08/2012 Inflat  STEMI/Cath/PCI: LM minor irregs, LAD 50p, D1 50, LCX nl, OM1 25, RCA 30-40p, 100d (treated with 2.75x9mm Promus Premier DES);  c. 08/2012 Echo: EF 55-60%, basal inferopost HK.  . Asthma   . Bell palsy   . C. difficile colitis    a. 08/2012  . CHF (congestive heart failure) ([place])   . Cholelithiasis 07/2012   Asymptomatic; identified incidentally  . Contrast dye induced nephropathy    a. 08/2012 post cath/pci  . COPD (chronic obstructive pulmonary disease) ([place])   . Diabetes mellitus    Peripheral neuropathy  . Gallstones   . GERD (gastroesophageal  reflux disease)   . Heart disease   . Hyperlipidemia   . Hypertension   . Myocardial infarct 09/08/12  . Nephrolithiasis   . Old myocardial infarction   . PONV (postoperative nausea and vomiting)   . Sleep apnea     Medications:  Prescriptions Prior to Admission  Medication Sig Dispense Refill Last Dose  . acetaminophen (TYLENOL) 500 MG tablet Take 1,000 mg by mouth every 6 (six) hours as needed for headache (pain).   few days ago  . allopurinol (ZYLOPRIM) 100 MG tablet Take 100 mg by mouth daily as needed (gout attack).   2 months ago  . ALPRAZolam (XANAX) 0.5 MG tablet TAKE 1/2-1 TABLET BY MOUTH AT BEDTIME AS NEEDED (Patient taking differently: TAKE 1/2-1 TABLET BY MOUTH AT BEDTIME AS NEEDED FOR SLEEP) 30 tablet 3 4-5 nights ago  . aspirin 81 MG chewable tablet Chew 81 mg by mouth at bedtime.   11/01/2016 at bedtime  . clopidogrel (PLAVIX) 75 MG tablet TAKE 1 TABLET AT BEDTIME 90 tablet 1 11/01/2016 at 2130  . colchicine 0.6 MG tablet Take 0.6 mg by mouth 2 (two) times daily as needed (gout attack).   2 months ago  . fenofibrate 160 MG tablet TAKE 1 TABLET AT BEDTIME 90 tablet 1 11/01/2016 at bedtime  . glimepiride (AMARYL) 1 MG tablet Take twice a day with meals (Patient taking differently: Take 1 mg by mouth 2 (  two) times daily with a meal. ) 60 tablet 5 11/02/2016 at am  . hydrALAZINE (APRESOLINE) 50 MG tablet Take 1 tablet (50 mg total) by mouth 2 (two) times daily. 180 tablet 1 11/02/2016 at am  . Insulin Detemir (LEVEMIR FLEXPEN) 100 UNIT/ML Pen Inject 80 Units into the skin daily at 10 pm. (Patient taking differently: Inject 60 Units into the skin at bedtime. ) 10 pen 4 11/01/2016 at bedtime  . isosorbide mononitrate (IMDUR) 30 MG 24 hr tablet Take 1 tablet (30 mg total) by mouth daily. 30 tablet 6 11/02/2016 at am  . metoprolol tartrate (LOPRESSOR) 25 MG tablet Take 50 mg by mouth 2 (two) times daily with a meal.   11/02/2016 at 930  . nitroGLYCERIN (NITROSTAT) 0.4 MG SL tablet Place 1 tablet (0.4  mg total) under the tongue every 5 (five) minutes x 3 doses as needed for chest pain. 25 tablet 2 3 months ago  . OVER THE COUNTER MEDICATION Place 1 drop into both eyes 2 (two) times daily as needed (dry eyes). Over the counter lubricating eye drop   1-2 weeks ago  . PRESCRIPTION MEDICATION Inhale into the lungs at bedtime. CPAP   11/01/2016 at pm  . torsemide (DEMADEX) 20 MG tablet Take 10 mg by mouth daily.    11/02/2016 at am  . traMADol (ULTRAM) 50 MG tablet TAKE 1 TABLET BY MOUTH EVERY 6 HOURS AS NEEDED FOR PAIN. 24 tablet 3 10/29/2016  . metoprolol tartrate (LOPRESSOR) 50 MG tablet Take 1 tablet (50 mg total) by mouth 2 (two) times daily. (Patient not taking: Reported on 11/02/2016) 60 tablet 6 Not Taking at Unknown time    Assessment: 61 y.o. M presents with SOB. Troponin up to 3.03 so heparin starting for ACS. Pt received SQ heparin 5000 units ~2300. CBC ok on admission. No AC PTA.  Goal of Therapy:  Heparin level 0.3-0.7 units/ml Monitor platelets by anticoagulation protocol: Yes   Plan:  D/c SQ heparin Heparin IV bolus 4000 units Heparin gtt at 1450 units/hr Will f/u heparin level in 6 hours Daily heparin level and CBC  Sherlon Handing, PharmD, BCPS Clinical pharmacist, pager [phone] 11/02/2016,11:31 PM   Addendum (0400) Heparin level subtherapeutic (0.14) on gtt at 1450 units/hr. Level drawn ~4 hours post heparin start.   Plan: Increase heparin gtt to 1700 units/hr Will f/u 6 hr heparin level  Sherlon Handing, PharmD, BCPS Clinical pharmacist, pager [phone] 11/03/2016 3:55 AM

## 2016-11-02 NOTE — ED Notes (Signed)
Patient received 324 ASA from EMS.  MD made aware

## 2016-11-02 NOTE — ED Notes (Signed)
Patient is stable and ready to be transport to the floor at this time.  Report was called to 3E RN.  Belongings taken with the patient to the floor.   

## 2016-11-02 NOTE — Progress Notes (Signed)
CRITICAL VALUE ALERT  Critical value received:  Troponin 3.03  Date of notification:  11/02/2016  Time of notification:  2312 Critical value read back: Yes  Nurse who received alert:  Hermina Barters  MD notified (1st page):  Cardiology Fellow Dr. Elson Areas  Time of first page:  2313  Responding MD:  Dr. Elson Areas  Time MD responded:  2315

## 2016-11-03 ENCOUNTER — Inpatient Hospital Stay (HOSPITAL_COMMUNITY): Payer: Medicare HMO

## 2016-11-03 DIAGNOSIS — R7989 Other specified abnormal findings of blood chemistry: Secondary | ICD-10-CM

## 2016-11-03 DIAGNOSIS — I509 Heart failure, unspecified: Secondary | ICD-10-CM

## 2016-11-03 DIAGNOSIS — I2510 Atherosclerotic heart disease of native coronary artery without angina pectoris: Secondary | ICD-10-CM

## 2016-11-03 DIAGNOSIS — I248 Other forms of acute ischemic heart disease: Secondary | ICD-10-CM

## 2016-11-03 DIAGNOSIS — I313 Pericardial effusion (noninflammatory): Secondary | ICD-10-CM

## 2016-11-03 DIAGNOSIS — E785 Hyperlipidemia, unspecified: Secondary | ICD-10-CM

## 2016-11-03 DIAGNOSIS — I25118 Atherosclerotic heart disease of native coronary artery with other forms of angina pectoris: Secondary | ICD-10-CM

## 2016-11-03 DIAGNOSIS — R778 Other specified abnormalities of plasma proteins: Secondary | ICD-10-CM

## 2016-11-03 DIAGNOSIS — N184 Chronic kidney disease, stage 4 (severe): Secondary | ICD-10-CM

## 2016-11-03 DIAGNOSIS — Z955 Presence of coronary angioplasty implant and graft: Secondary | ICD-10-CM

## 2016-11-03 LAB — GLUCOSE, CAPILLARY
GLUCOSE-CAPILLARY: 198 mg/dL — AB (ref 65–99)
Glucose-Capillary: 202 mg/dL — ABNORMAL HIGH (ref 65–99)
Glucose-Capillary: 174 mg/dL — ABNORMAL HIGH (ref 65–99)

## 2016-11-03 LAB — ECHOCARDIOGRAM COMPLETE
AO mean calculated velocity dopler: 82.4 cm/s
AOPV: 0.77 m/s
AOVTI: 21.6 cm
AV Area VTI index: 1.02 cm2/m2
AV Area VTI: 2.43 cm2
AV Area mean vel: 2.31 cm2
AV Peak grad: 6 mmHg
AV VEL mean LVOT/AV: 0.74
AV peak Index: 1.01
AV pk vel: 126 cm/s
AV vel: 2.47
AVA: 2.47 cm2
AVAREAMEANVIN: 0.96 cm2/m2
AVG: 3 mmHg
CHL CUP AV VALUE AREA INDEX: 1.02
CHL CUP DOP CALC LVOT VTI: 17 cm
CHL CUP STROKE VOLUME: 65 mL
E decel time: 113 msec
E/e' ratio: 17.41
FS: 23 % — AB (ref 28–44)
Height: 73 in
IVS/LV PW RATIO, ED: 1.05
LA ID, A-P, ES: 49 mm
LA diam end sys: 49 mm
LA diam index: 2.03 cm/m2
LA vol A4C: 91.8 ml
LAVOL: 92.3 mL
LAVOLIN: 38.3 mL/m2
LV PW d: 17.2 mm — AB (ref 0.6–1.1)
LV SIMPSON'S DISK: 40
LV TDI E'LATERAL: 5.36
LV dias vol index: 67 mL/m2
LV e' LATERAL: 5.36 cm/s
LV sys vol: 96 mL — AB (ref 21–61)
LVDIAVOL: 161 mL — AB (ref 62–150)
LVEEAVG: 17.41
LVEEMED: 17.41
LVOT SV: 53 mL
LVOT area: 3.14 cm2
LVOT diameter: 20 mm
LVOT peak VTI: 0.79 cm
LVOT peak vel: 97.5 cm/s
LVSYSVOLIN: 40 mL/m2
MV Dec: 113
MV pk A vel: 84.4 m/s
MV pk E vel: 93.3 m/s
MVPG: 3 mmHg
RV LATERAL S' VELOCITY: 9.83 cm/s
TAPSE: 15.3 mm
TDI e' medial: 5.29
Weight: 4174.4 oz

## 2016-11-03 LAB — BASIC METABOLIC PANEL
Anion gap: 11 (ref 5–15)
BUN: 48 mg/dL — AB (ref 6–20)
CHLORIDE: 104 mmol/L (ref 101–111)
CO2: 22 mmol/L (ref 22–32)
Calcium: 8.5 mg/dL — ABNORMAL LOW (ref 8.9–10.3)
Creatinine, Ser: 2.13 mg/dL — ABNORMAL HIGH (ref 0.61–1.24)
GFR calc Af Amer: 37 mL/min — ABNORMAL LOW (ref >60)
GFR, EST NON AFRICAN AMERICAN: 32 mL/min — AB (ref >60)
GLUCOSE: 144 mg/dL — AB (ref 65–99)
POTASSIUM: 4 mmol/L (ref 3.5–5.1)
Sodium: 137 mmol/L (ref 135–145)

## 2016-11-03 LAB — CBC
HEMATOCRIT: 30.4 % — AB (ref 39.0–52.0)
HEMOGLOBIN: 9.9 g/dL — AB (ref 13.0–17.0)
MCH: 29.5 pg (ref 26.0–34.0)
MCHC: 32.6 g/dL (ref 30.0–36.0)
MCV: 90.5 fL (ref 78.0–100.0)
Platelets: 214 10*3/uL (ref 150–400)
RBC: 3.36 MIL/uL — ABNORMAL LOW (ref 4.22–5.81)
RDW: 14.1 % (ref 11.5–15.5)
WBC: 6.5 10*3/uL (ref 4.0–10.5)

## 2016-11-03 LAB — HEPARIN LEVEL (UNFRACTIONATED)
Heparin Unfractionated: 0.14 IU/mL — ABNORMAL LOW (ref 0.30–0.70)
Heparin Unfractionated: 0.19 IU/mL — ABNORMAL LOW (ref 0.30–0.70)
Heparin Unfractionated: 0.22 IU/mL — ABNORMAL LOW (ref 0.30–0.70)

## 2016-11-03 LAB — HIV ANTIBODY (ROUTINE TESTING W REFLEX): HIV SCREEN 4TH GENERATION: NONREACTIVE

## 2016-11-03 LAB — TROPONIN I
Troponin I: 2.24 ng/mL (ref <0.03)
Troponin I: 2.18 ng/mL (ref <0.03)

## 2016-11-03 MED ORDER — INSULIN ASPART 100 UNIT/ML ~~LOC~~ SOLN
0.0000 [IU] | Freq: Three times a day (TID) | SUBCUTANEOUS | Status: DC
  Administered 2016-11-03: 2 [IU] via SUBCUTANEOUS
  Administered 2016-11-03: 3 [IU] via SUBCUTANEOUS

## 2016-11-03 NOTE — Plan of Care (Signed)
Problem: Health Behavior/Discharge Planning: Goal: Ability to manage health-related needs will improve Outcome: Progressing Went over CHF booklet with patient, discussed zones with patient and importance of taking daily weights which patient says he had stopped doing at home, does have scales   Problem: Activity: Goal: Risk for activity intolerance will decrease Outcome: Progressing Remains mildly short of breath with exertion   Problem: Fluid Volume: Goal: Ability to maintain a balanced intake and output will improve Outcome: Progressing Patient continues to diurese, receiving IV lasix

## 2016-11-03 NOTE — Progress Notes (Signed)
ANTICOAGULATION CONSULT NOTE - [place] for Heparin Indication: chest pain/ACS  Allergies  Allergen Reactions  . Hydrocodone Nausea And Vomiting  . Lisinopril Cough  . Neurontin [Gabapentin] Other (See Comments)    Suicidal thoughts  . Statins Other (See Comments)    Muscle aches  . Metformin And Related Diarrhea and Other (See Comments)    Intestinal side effects  . Norvasc [Amlodipine Besylate] Swelling    Pedal edema    Patient Measurements: Height: 6\' 1"  (185.4 cm) Weight: 260 lb 14.4 oz (118.3 kg) IBW/kg (Calculated) : 79.9 Heparin Dosing Weight: 104 kg  Vital Signs: Temp: 98.4 F (36.9 C) (03/10 0900) Temp Source: Oral (03/10 0900) BP: 91/69 (03/10 0900) Pulse Rate: 59 (03/10 0900)  Labs:  Recent Labs  11/02/16 1605 11/02/16 2158 11/03/16 0308 11/03/16 1028  HGB 10.3* 11.2* 9.9*  --   HCT 32.1* 34.7* 30.4*  --   PLT 197 247 214  --   HEPARINUNFRC  --   --  0.14* 0.19*  CREATININE 2.15* 2.15* 2.13*  --   TROPONINI  --  3.03* 2.24* 2.18*    Estimated Creatinine Clearance: 50.3 mL/min (by C-G formula based on SCr of 2.13 mg/dL (H)).   Medical History: Past Medical History:  Diagnosis Date  . Anginal pain ([place])   . Anxiety   . Arteriosclerotic cardiovascular disease (ASCVD)    a. 05/2011 Cath/PCI: LM nl, LAD 48m, D1 small, D2 small 21m, LCX large 40p, RCA 50-60p, 99 hazy @ origin of PDA with 70-80 in PDA (2.5x26 Resolute Integrity & 3.0x15 Resolute Integrity DES).;  b. 08/2012 Inflat  STEMI/Cath/PCI: LM minor irregs, LAD 50p, D1 50, LCX nl, OM1 25, RCA 30-40p, 100d (treated with 2.75x22mm Promus Premier DES);  c. 08/2012 Echo: EF 55-60%, basal inferopost HK.  . Asthma   . Bell palsy   . C. difficile colitis    a. 08/2012  . CHF (congestive heart failure) ([place])   . Cholelithiasis 07/2012   Asymptomatic; identified incidentally  . Contrast dye induced nephropathy    a. 08/2012 post cath/pci  . COPD (chronic obstructive  pulmonary disease) ([place])   . Diabetes mellitus    Peripheral neuropathy  . Gallstones   . GERD (gastroesophageal reflux disease)   . Heart disease   . Hyperlipidemia   . Hypertension   . Myocardial infarct 09/08/12  . Nephrolithiasis   . Old myocardial infarction   . PONV (postoperative nausea and vomiting)   . Sleep apnea     Medications:  Prescriptions Prior to Admission  Medication Sig Dispense Refill Last Dose  . acetaminophen (TYLENOL) 500 MG tablet Take 1,000 mg by mouth every 6 (six) hours as needed for headache (pain).   few days ago  . allopurinol (ZYLOPRIM) 100 MG tablet Take 100 mg by mouth daily as needed (gout attack).   2 months ago  . ALPRAZolam (XANAX) 0.5 MG tablet TAKE 1/2-1 TABLET BY MOUTH AT BEDTIME AS NEEDED (Patient taking differently: TAKE 1/2-1 TABLET BY MOUTH AT BEDTIME AS NEEDED FOR SLEEP) 30 tablet 3 4-5 nights ago  . aspirin 81 MG chewable tablet Chew 81 mg by mouth at bedtime.   11/01/2016 at bedtime  . clopidogrel (PLAVIX) 75 MG tablet TAKE 1 TABLET AT BEDTIME 90 tablet 1 11/01/2016 at 2130  . colchicine 0.6 MG tablet Take 0.6 mg by mouth 2 (two) times daily as needed (gout attack).   2 months ago  . fenofibrate 160 MG tablet TAKE 1 TABLET  AT BEDTIME 90 tablet 1 11/01/2016 at bedtime  . glimepiride (AMARYL) 1 MG tablet Take twice a day with meals (Patient taking differently: Take 1 mg by mouth 2 (two) times daily with a meal. ) 60 tablet 5 11/02/2016 at am  . hydrALAZINE (APRESOLINE) 50 MG tablet Take 1 tablet (50 mg total) by mouth 2 (two) times daily. 180 tablet 1 11/02/2016 at am  . Insulin Detemir (LEVEMIR FLEXPEN) 100 UNIT/ML Pen Inject 80 Units into the skin daily at 10 pm. (Patient taking differently: Inject 60 Units into the skin at bedtime. ) 10 pen 4 11/01/2016 at bedtime  . isosorbide mononitrate (IMDUR) 30 MG 24 hr tablet Take 1 tablet (30 mg total) by mouth daily. 30 tablet 6 11/02/2016 at am  . metoprolol tartrate (LOPRESSOR) 25 MG tablet Take 50 mg by mouth  2 (two) times daily with a meal.   11/02/2016 at 930  . nitroGLYCERIN (NITROSTAT) 0.4 MG SL tablet Place 1 tablet (0.4 mg total) under the tongue every 5 (five) minutes x 3 doses as needed for chest pain. 25 tablet 2 3 months ago  . OVER THE COUNTER MEDICATION Place 1 drop into both eyes 2 (two) times daily as needed (dry eyes). Over the counter lubricating eye drop   1-2 weeks ago  . PRESCRIPTION MEDICATION Inhale into the lungs at bedtime. CPAP   11/01/2016 at pm  . torsemide (DEMADEX) 20 MG tablet Take 10 mg by mouth daily.    11/02/2016 at am  . traMADol (ULTRAM) 50 MG tablet TAKE 1 TABLET BY MOUTH EVERY 6 HOURS AS NEEDED FOR PAIN. 24 tablet 3 10/29/2016  . metoprolol tartrate (LOPRESSOR) 50 MG tablet Take 1 tablet (50 mg total) by mouth 2 (two) times daily. (Patient not taking: Reported on 11/02/2016) 60 tablet 6 Not Taking at Unknown time    Assessment: 61 y.o. M presents with SOB. Troponin up to 3.03 so heparin starting for ACS.  No AC PTA. Heparin level subtherapeutic this AM at 0.19 on 1700 units/hr. CBC down but WNL. No overt signs of bleeding noted. No problems with line per nurse.   Goal of Therapy:  Heparin level 0.3-0.7 units/ml Monitor platelets by anticoagulation protocol: Yes   Plan:  Increase Heparin gtt to 1900 units/hr Will f/u heparin level in 6 hours Daily heparin level and CBC  Uvaldo Bristle, PharmD PGY1 Pharmacy Resident Pager; [phone] 11/03/2016,1:05 PM

## 2016-11-03 NOTE — Progress Notes (Signed)
Progress Note  Patient Name: Eugene [name] Date of Encounter: 11/03/2016  Primary Cardiologist: Bronson Ing MD  Subjective   Denies chest pain and says "breathing is back to normal". Said he experienced symptom progression after PCP reduced torsemide to 10 mg daily from alternating days of 20 mg and 10 mg.  Inpatient Medications    Scheduled Meds: . aspirin  81 mg Oral QHS  . clopidogrel  75 mg Oral QHS  . fenofibrate  160 mg Oral QHS  . furosemide  80 mg Intravenous BID  . glimepiride  1 mg Oral BID WC  . hydrALAZINE  50 mg Oral Q8H  . insulin aspart  0-9 Units Subcutaneous TID WC  . isosorbide mononitrate  30 mg Oral Daily  . metoprolol tartrate  50 mg Oral BID WC  . sodium chloride flush  3 mL Intravenous Q12H   Continuous Infusions: . heparin 1,700 Units/hr (11/03/16 0409)   PRN Meds: sodium chloride, acetaminophen, allopurinol, ALPRAZolam, colchicine, nitroGLYCERIN, ondansetron (ZOFRAN) IV, sodium chloride flush, traMADol   Vital Signs    Vitals:   11/03/16 0023 11/03/16 0645 11/03/16 0651 11/03/16 0900  BP: 114/63 (!) 111/59  91/69  Pulse: 75 80  (!) 59  Resp: 18 17  18   Temp: 98.8 F (37.1 C) 98.2 F (36.8 C)  98.4 F (36.9 C)  TempSrc: Oral Oral  Oral  SpO2: 93% 94%  95%  Weight:   260 lb 14.4 oz (118.3 kg)   Height:        Intake/Output Summary (Last 24 hours) at 11/03/16 1100 Last data filed at 11/03/16 0900  Gross per 24 hour  Intake           468.38 ml  Output             2300 ml  Net         -1831.62 ml   Filed Weights   11/02/16 2130 11/03/16 0651  Weight: 261 lb 4.8 oz (118.5 kg) 260 lb 14.4 oz (118.3 kg)    Telemetry     - Personally Reviewed  ECG     - Personally Reviewed  Physical Exam   GEN: No acute distress.   Neck: No JVD Cardiac: RRR, no murmurs, rubs, or gallops.  Respiratory: Bibasilar rales GI: Firm, mildly distended  MS: Trace-1+ b/l pretibial edema; No deformity. Neuro:  Nonfocal  Psych: Normal affect    Labs    Chemistry Recent Labs Lab 11/02/16 1605 11/02/16 2158 11/03/16 0308  NA 136  --  137  K 4.4  --  4.0  CL 106  --  104  CO2 22  --  22  GLUCOSE 152*  --  144*  BUN 46*  --  48*  CREATININE 2.15* 2.15* 2.13*  CALCIUM 8.7*  --  8.5*  GFRNONAA 32* 32* 32*  GFRAA 37* 37* 37*  ANIONGAP 8  --  11     Hematology Recent Labs Lab 11/02/16 1605 11/02/16 2158 11/03/16 0308  WBC 6.0 8.7 6.5  RBC 3.53* 3.81* 3.36*  HGB 10.3* 11.2* 9.9*  HCT 32.1* 34.7* 30.4*  MCV 90.9 91.1 90.5  MCH 29.2 29.4 29.5  MCHC 32.1 32.3 32.6  RDW 14.0 14.3 14.1  PLT 197 247 214    Cardiac Enzymes Recent Labs Lab 11/02/16 2158 11/03/16 0308  TROPONINI 3.03* 2.24*    Recent Labs Lab 11/02/16 1625  TROPIPOC 1.43*     BNP Recent Labs Lab 11/02/16 1605  BNP 721.9*  DDimer No results for input(s): DDIMER in the last 168 hours.   Radiology    Dg Chest 2 View  Result Date: 11/02/2016 CLINICAL DATA:  Shortness of breath. EXAM: CHEST  2 VIEW COMPARISON:  Two-view chest x-ray 11/09/2015. FINDINGS: The heart is enlarged. There is a mild diffuse interstitial pattern compatible with edema. Small effusions are present. Mild dependent atelectasis is present bilaterally without other focal airspace disease. The visualized soft tissues and bony thorax are unremarkable. IMPRESSION: 1. Cardiomegaly with mild edema and small effusions compatible with congestive heart failure. 2. No significant focal airspace disease. Electronically Signed   By: San Morelle M.D.   On: 11/02/2016 16:27    Cardiac Studies   Echocardiogram pending  Patient Profile     61 y.o. male with a history of chronic diastolic heart failure, CKD, HLD, DMT2, malignant hypertension, chronic pericardial effusion s/p pericardial window  (2014) and CAD s/p DES to [facility] and PDA (2012), STEMI s/p DES to dRCA (2014) who presented to [facility] on 3/9 with progressive SOB.  Assessment & Plan    1. Acute on chronic  diastolic CHF: CXR shows CHF and BNP 721. Recently home torsemide was decreased to 10 mg daily by PCP given progressive renal insuffiency. Interestingly, weight is stable from that visit at 259 lbs and better than when he saw me back in 04/2016. Markedly improved from a symptomatic standpoint with over 2L output in last 24 hrs. BUN/Cr stable. BP well controlled at home. Still has bibasilar rales. Will continue IV lasix 80mg  BID and continue to monitor. Awaiting 2D ECHO.  2. Demand ischemia: troponin up to 2.15. This is a little higher than I would expect with demand ischemia, but creat up to 2.15. ECG with no acute ST or TW changes. He has had no chest pain. For now, continue IV heparin until echocardiogram has been reviewed to assess for interval changes in LV systolic function and/or regional wall motion.  3. Malignant HTN: BP controlled. Continue hydralazine 50mg  TID.    4. CKD: creat 2.15, which is a little elevated from his baseline ~2.   5. Pericardial effusion s/p pericardial window: Awaiting 2D ECHO  6. CAD s/p multiple PCI's to RCA: continue ASA/plavix, BB. Statin intolerant  7. Dyslipidemia: Statin intolerant. On fenofibrate. No longer on Zetia. LDL 118, TC 204, TG 274 on 10/16/16. Will initiate PCSK-9 inhibitors in outpatient setting.   Dispo: Has outpatient follow up with me already scheduled for later this month.  Signed, Kate Sable, MD  11/03/2016, 11:00 AM

## 2016-11-03 NOTE — Progress Notes (Signed)
ANTICOAGULATION CONSULT NOTE - [place] for Heparin Indication: chest pain/ACS  Allergies  Allergen Reactions  . Hydrocodone Nausea And Vomiting  . Lisinopril Cough  . Neurontin [Gabapentin] Other (See Comments)    Suicidal thoughts  . Statins Other (See Comments)    Muscle aches  . Metformin And Related Diarrhea and Other (See Comments)    Intestinal side effects  . Norvasc [Amlodipine Besylate] Swelling    Pedal edema    Patient Measurements: Height: 6\' 1"  (185.4 cm) Weight: 260 lb 14.4 oz (118.3 kg) IBW/kg (Calculated) : 79.9 Heparin Dosing Weight: 104 kg  Vital Signs: Temp: 97 F (36.1 C) (03/10 1951) Temp Source: Oral (03/10 1951) BP: 106/71 (03/10 1951) Pulse Rate: 64 (03/10 1951)  Labs:  Recent Labs  11/02/16 1605 11/02/16 2158 11/03/16 0308 11/03/16 1028 11/03/16 1932  HGB 10.3* 11.2* 9.9*  --   --   HCT 32.1* 34.7* 30.4*  --   --   PLT 197 247 214  --   --   HEPARINUNFRC  --   --  0.14* 0.19* 0.22*  CREATININE 2.15* 2.15* 2.13*  --   --   TROPONINI  --  3.03* 2.24* 2.18*  --     Estimated Creatinine Clearance: 50.3 mL/min (by C-G formula based on SCr of 2.13 mg/dL (H)).   Medical History: Past Medical History:  Diagnosis Date  . Anginal pain ([place])   . Anxiety   . Arteriosclerotic cardiovascular disease (ASCVD)    a. 05/2011 Cath/PCI: LM nl, LAD 57m, D1 small, D2 small 53m, LCX large 40p, RCA 50-60p, 99 hazy @ origin of PDA with 70-80 in PDA (2.5x26 Resolute Integrity & 3.0x15 Resolute Integrity DES).;  b. 08/2012 Inflat  STEMI/Cath/PCI: LM minor irregs, LAD 50p, D1 50, LCX nl, OM1 25, RCA 30-40p, 100d (treated with 2.75x45mm Promus Premier DES);  c. 08/2012 Echo: EF 55-60%, basal inferopost HK.  . Asthma   . Bell palsy   . C. difficile colitis    a. 08/2012  . CHF (congestive heart failure) ([place])   . Cholelithiasis 07/2012   Asymptomatic; identified incidentally  . Contrast dye induced nephropathy    a. 08/2012 post  cath/pci  . COPD (chronic obstructive pulmonary disease) ([place])   . Diabetes mellitus    Peripheral neuropathy  . Gallstones   . GERD (gastroesophageal reflux disease)   . Heart disease   . Hyperlipidemia   . Hypertension   . Myocardial infarct 09/08/12  . Nephrolithiasis   . Old myocardial infarction   . PONV (postoperative nausea and vomiting)   . Sleep apnea     Medications:  Prescriptions Prior to Admission  Medication Sig Dispense Refill Last Dose  . acetaminophen (TYLENOL) 500 MG tablet Take 1,000 mg by mouth every 6 (six) hours as needed for headache (pain).   few days ago  . allopurinol (ZYLOPRIM) 100 MG tablet Take 100 mg by mouth daily as needed (gout attack).   2 months ago  . ALPRAZolam (XANAX) 0.5 MG tablet TAKE 1/2-1 TABLET BY MOUTH AT BEDTIME AS NEEDED (Patient taking differently: TAKE 1/2-1 TABLET BY MOUTH AT BEDTIME AS NEEDED FOR SLEEP) 30 tablet 3 4-5 nights ago  . aspirin 81 MG chewable tablet Chew 81 mg by mouth at bedtime.   11/01/2016 at bedtime  . clopidogrel (PLAVIX) 75 MG tablet TAKE 1 TABLET AT BEDTIME 90 tablet 1 11/01/2016 at 2130  . colchicine 0.6 MG tablet Take 0.6 mg by mouth 2 (two) times daily  as needed (gout attack).   2 months ago  . fenofibrate 160 MG tablet TAKE 1 TABLET AT BEDTIME 90 tablet 1 11/01/2016 at bedtime  . glimepiride (AMARYL) 1 MG tablet Take twice a day with meals (Patient taking differently: Take 1 mg by mouth 2 (two) times daily with a meal. ) 60 tablet 5 11/02/2016 at am  . hydrALAZINE (APRESOLINE) 50 MG tablet Take 1 tablet (50 mg total) by mouth 2 (two) times daily. 180 tablet 1 11/02/2016 at am  . Insulin Detemir (LEVEMIR FLEXPEN) 100 UNIT/ML Pen Inject 80 Units into the skin daily at 10 pm. (Patient taking differently: Inject 60 Units into the skin at bedtime. ) 10 pen 4 11/01/2016 at bedtime  . isosorbide mononitrate (IMDUR) 30 MG 24 hr tablet Take 1 tablet (30 mg total) by mouth daily. 30 tablet 6 11/02/2016 at am  . metoprolol tartrate  (LOPRESSOR) 25 MG tablet Take 50 mg by mouth 2 (two) times daily with a meal.   11/02/2016 at 930  . nitroGLYCERIN (NITROSTAT) 0.4 MG SL tablet Place 1 tablet (0.4 mg total) under the tongue every 5 (five) minutes x 3 doses as needed for chest pain. 25 tablet 2 3 months ago  . OVER THE COUNTER MEDICATION Place 1 drop into both eyes 2 (two) times daily as needed (dry eyes). Over the counter lubricating eye drop   1-2 weeks ago  . PRESCRIPTION MEDICATION Inhale into the lungs at bedtime. CPAP   11/01/2016 at pm  . torsemide (DEMADEX) 20 MG tablet Take 10 mg by mouth daily.    11/02/2016 at am  . traMADol (ULTRAM) 50 MG tablet TAKE 1 TABLET BY MOUTH EVERY 6 HOURS AS NEEDED FOR PAIN. 24 tablet 3 10/29/2016  . metoprolol tartrate (LOPRESSOR) 50 MG tablet Take 1 tablet (50 mg total) by mouth 2 (two) times daily. (Patient not taking: Reported on 11/02/2016) 60 tablet 6 Not Taking at Unknown time    Assessment: 61 y.o. M presents with SOB. Troponin up to 3.03 so heparin starting for ACS.  No AC PTA. Heparin level subtherapeutic this AM at 0.19 on 1700 units/hr. CBC down but WNL. No overt signs of bleeding noted. No problems with line per nurse.   Repeat heparin level remains subtherapeutic at 0.22 on heparin 1900 units/h. Nurse reports no issues with infusion or bleeding. Level barely moved after 2 units/kg/hr increase so will increase more aggressively by 4 units/kg/hr.   Goal of Therapy:  Heparin level 0.3-0.7 units/ml Monitor platelets by anticoagulation protocol: Yes   Plan:  Increase heparin to 2300 units/hr 6h HL Daily heparin level and CBC  Andrey Cota. Diona Foley, PharmD, BCPS Clinical Pharmacist [phone] 11/03/2016,8:04 PM

## 2016-11-03 NOTE — Progress Notes (Signed)
Pt refused bed alarm on, educated on pt's safety plan, pt's verbalized understanding. Will continue to do hourly rounding.

## 2016-11-03 NOTE — Progress Notes (Signed)
Patient stable during 7 a to 7 p shift, VSS, O2 sats in 90's on room air.  Patient denies any chest pain, says shortness of breath has improved with diuresis.  Patient ambulatory in room, sat in chair most of the shift.  Wife in to visit.

## 2016-11-04 DIAGNOSIS — I214 Non-ST elevation (NSTEMI) myocardial infarction: Secondary | ICD-10-CM

## 2016-11-04 LAB — BASIC METABOLIC PANEL
Anion gap: 12 (ref 5–15)
BUN: 60 mg/dL — AB (ref 6–20)
CO2: 24 mmol/L (ref 22–32)
CREATININE: 2.71 mg/dL — AB (ref 0.61–1.24)
Calcium: 8.6 mg/dL — ABNORMAL LOW (ref 8.9–10.3)
Chloride: 100 mmol/L — ABNORMAL LOW (ref 101–111)
GFR calc Af Amer: 28 mL/min — ABNORMAL LOW (ref >60)
GFR calc non Af Amer: 24 mL/min — ABNORMAL LOW (ref >60)
GLUCOSE: 168 mg/dL — AB (ref 65–99)
POTASSIUM: 4.1 mmol/L (ref 3.5–5.1)
SODIUM: 136 mmol/L (ref 135–145)

## 2016-11-04 LAB — CBC
HEMATOCRIT: 29.9 % — AB (ref 39.0–52.0)
Hemoglobin: 9.6 g/dL — ABNORMAL LOW (ref 13.0–17.0)
MCH: 29.2 pg (ref 26.0–34.0)
MCHC: 32.1 g/dL (ref 30.0–36.0)
MCV: 90.9 fL (ref 78.0–100.0)
Platelets: 181 10*3/uL (ref 150–400)
RBC: 3.29 MIL/uL — ABNORMAL LOW (ref 4.22–5.81)
RDW: 14.3 % (ref 11.5–15.5)
WBC: 5.1 10*3/uL (ref 4.0–10.5)

## 2016-11-04 LAB — GLUCOSE, CAPILLARY
Glucose-Capillary: 153 mg/dL — ABNORMAL HIGH (ref 65–99)
Glucose-Capillary: 159 mg/dL — ABNORMAL HIGH (ref 65–99)
Glucose-Capillary: 193 mg/dL — ABNORMAL HIGH (ref 65–99)

## 2016-11-04 LAB — HEPARIN LEVEL (UNFRACTIONATED)
Heparin Unfractionated: 0.44 IU/mL (ref 0.30–0.70)
Heparin Unfractionated: 0.54 IU/mL (ref 0.30–0.70)

## 2016-11-04 MED ORDER — INSULIN ASPART 100 UNIT/ML ~~LOC~~ SOLN
0.0000 [IU] | Freq: Three times a day (TID) | SUBCUTANEOUS | Status: DC
  Administered 2016-11-04 (×2): 2 [IU] via SUBCUTANEOUS

## 2016-11-04 MED ORDER — GLIMEPIRIDE 1 MG PO TABS
1.0000 mg | ORAL_TABLET | Freq: Two times a day (BID) | ORAL | Status: DC
  Administered 2016-11-04: 1 mg via ORAL
  Filled 2016-11-04 (×2): qty 1

## 2016-11-04 MED ORDER — HYDRALAZINE HCL 50 MG PO TABS: 50.0000 mg | ORAL_TABLET | Freq: Three times a day (TID) | ORAL | 1 refills | Status: DC

## 2016-11-04 MED ORDER — TORSEMIDE 20 MG PO TABS: ORAL_TABLET | ORAL | Status: DC

## 2016-11-04 NOTE — Progress Notes (Signed)
Discharge instructions reviewed with patient and wife, questions answered, verbalized understanding.  Reviewed all medications, times of next doses with patient. Discussed with patient the importance of taking weights daily after he voids but before he eats or gets dressed and recording these weights.  Also discussed zone tool with patient and importance of contacting his physician if he is in the yellow zone.  Patient verbalized understanding.  Patient transported to main entrance via wheelchair to be taken home by wife.

## 2016-11-04 NOTE — Plan of Care (Signed)
Problem: Health Behavior/Discharge Planning: Goal: Ability to manage health-related needs will improve Outcome: Completed/Met Date Met: 11/04/16 Reviewed again the importance of daily weights, keeping sodium intake below 2000 mg a day, knowing his zone daily on the zone tool, patient verbalized understanding   Problem: Activity: Goal: Risk for activity intolerance will decrease Outcome: Completed/Met Date Met: 11/04/16 Can ambulate without increased shortness of breath at time of discharge

## 2016-11-04 NOTE — Discharge Summary (Signed)
Discharge Summary    Patient ID: Eugene [name],  MRN: [id number], DOB/AGE: 1956/02/16 61 y.o.  Admit date: 11/02/2016 Discharge date: 11/04/2016  Primary Care Provider: [name] Primary Cardiologist: Dr. Bronson Ing  Discharge Diagnoses    Active Problems:   Pericardial effusion   CHF (congestive heart failure) (HCC)   Elevated troponin   Coronary artery disease involving native coronary artery of native heart without angina pectoris   History of Present Illness     Eugene [name] is a 61 y.o. male with past medical history of CAD (s/p STEMI in 2014 with DES to [facility]), chronic diastolic CHF (EF 42-59% by echo in 10/2015), HTN, HLD, CKD, and chronic pericardial effusion (s/p pericardial window in 2014) who presented to [facility] ED on 11/02/2016 with worsening dyspnea.   Had been in his usual state of health until last week when he saw his PCP who decreased his Torsemide from 20mg /10mg  alternating every other day to Torsemide 10mg  daily. Patient stated he had a kidney infection and was on abx. Per review of Dr. Lance Sell note, he decreased Torsemide given worsening renal function.   Since that time, he experienced progressive LE edema, abdominal distension and SOB. He denied any recent chest pain. No palpations.   BNP was at 721 with initial troponin of 1.43. He was therefore started on IV Lasix 80mg  BID along with Heparin and admitted for further observation.   Hospital Course     Consultants: None  The following morning, he denied any chest pain and reported his breathing was at baseline. Troponin peaked at 3.03 and was trending down to 2.18. EKG was without acute ischemic changes. Echocardiogram was performed which showed a mildly reduced EF of 45-50% with HK of the mid-apical inferior and inferoseptal myocardium. There was trivial MR along with a large pericardial effusion with no evidence of tamponade physiology.   He was examined by Dr. Bronson Ing on 11/04/2016  and deemed stable for discharge. He reported no acute symptoms. He was overall -3.0L with a discharge weight of 259 lbs. Creatinine was trending upwards (2.15 --> 2.13 --> 2.71). Was instructed to resume Torsemide 10mg  tomorrow then 20mg  on Tuesday with his original alternating schedule.  Will need a repeat limited echo later this week to assess his pericardial effusion along with a BMET to recheck kidney function.   With his elevated troponin enzymes, an outpatient stress test was recommended.  A staff message has been sent to the [place] office to help arrange his limited echocardiogram and BMET. He has scheduled follow-up on 11/14/2016 with Dr. Bronson Ing.   _____________  Discharge Vitals Blood pressure (!) 145/74, pulse 79, temperature 98.4 F (36.9 C), temperature source Oral, resp. rate 18, height 6\' 1"  (1.854 m), weight 259 lb 11.2 oz (117.8 kg), SpO2 96 %.  Filed Weights   11/02/16 2130 11/03/16 0651 11/04/16 0446  Weight: 261 lb 4.8 oz (118.5 kg) 260 lb 14.4 oz (118.3 kg) 259 lb 11.2 oz (117.8 kg)    Labs & Radiologic Studies     CBC  Recent Labs  11/02/16 1605  11/03/16 0308 11/04/16 0304  WBC 6.0  < > 6.5 5.1  NEUTROABS 4.3  --   --   --   HGB 10.3*  < > 9.9* 9.6*  HCT 32.1*  < > 30.4* 29.9*  MCV 90.9  < > 90.5 90.9  PLT 197  < > 214 181  < > = values in this interval not displayed. Basic Metabolic  Panel  Recent Labs  11/02/16 2158 11/03/16 0308 11/04/16 0304  NA  --  137 136  K  --  4.0 4.1  CL  --  104 100*  CO2  --  22 24  GLUCOSE  --  144* 168*  BUN  --  48* 60*  CREATININE 2.15* 2.13* 2.71*  CALCIUM  --  8.5* 8.6*  MG 2.1  --   --    Liver Function Tests No results for input(s): AST, ALT, ALKPHOS, BILITOT, PROT, ALBUMIN in the last 72 hours. No results for input(s): LIPASE, AMYLASE in the last 72 hours. Cardiac Enzymes  Recent Labs  11/02/16 2158 11/03/16 0308 11/03/16 1028  TROPONINI 3.03* 2.24* 2.18*   BNP Invalid input(s):  POCBNP D-Dimer No results for input(s): DDIMER in the last 72 hours. Hemoglobin A1C No results for input(s): HGBA1C in the last 72 hours. Fasting Lipid Panel No results for input(s): CHOL, HDL, LDLCALC, TRIG, CHOLHDL, LDLDIRECT in the last 72 hours. Thyroid Function Tests  Recent Labs  11/02/16 2158  TSH 1.585    Dg Chest 2 View  Result Date: 11/02/2016 CLINICAL DATA:  Shortness of breath. EXAM: CHEST  2 VIEW COMPARISON:  Two-view chest x-ray 11/09/2015. FINDINGS: The heart is enlarged. There is a mild diffuse interstitial pattern compatible with edema. Small effusions are present. Mild dependent atelectasis is present bilaterally without other focal airspace disease. The visualized soft tissues and bony thorax are unremarkable. IMPRESSION: 1. Cardiomegaly with mild edema and small effusions compatible with congestive heart failure. 2. No significant focal airspace disease. Electronically Signed   By: San Morelle M.D.   On: 11/02/2016 16:27     Diagnostic Studies/Procedures    Echocardiogram: 11/03/2016 Study Conclusions  - Left ventricle: The cavity size was normal. Wall thickness was   increased in a pattern of severe LVH. Systolic function was   mildly reduced. The estimated ejection fraction was in the range   of 45% to 50%. There is hypokinesis of the mid-apicalinferior and   inferoseptal myocardium. Features are consistent with a   pseudonormal left ventricular filling pattern, with concomitant   abnormal relaxation and increased filling pressure (grade 2   diastolic dysfunction). - Aortic valve: Mildly calcified annulus. Trileaflet. Mean gradient   (S): 3 mm Hg. - Mitral valve: Mildly thickened leaflets . There was trivial   regurgitation. - Left atrium: The atrium was mildly dilated. - Right atrium: Central venous pressure (est): 8 mm Hg. - Atrial septum: No defect or patent foramen ovale was identified. - Tricuspid valve: There was trivial regurgitation. -  Pulmonary arteries: Systolic pressure could not be accurately   estimated. - Pericardium, extracardiac: Large pericardial effusion noted   posteriorly with smaller collection anteriorly. No obvious right   ventricular compromise with mild indention of the right atrial   free wall.  Impressions:  - Severe LVH with LVEF 45-50%. There is mid to apical inferior and   inferoseptal hypokinesis noted. Grade 2 diastolic dysfunction.   Mild left atrial enlargement. Trivial mitral regurgitation.   Mildly calcified aortic annulus. Trivial tricuspid regurgitation.   Large pericardial effusion noted posteriorly with smaller   collection anteriorly. No obvious right ventricular compromise to   suggest tamponade physiology.  Disposition   Pt is being discharged home today in good condition.  Follow-up Plans & Appointments    Follow-up [place], MD Follow up on 11/14/2016.   Specialty:  Cardiology Why:  Cardiology Follow-Up on 11/14/2016 at 11:00AM.  Contact information: [place] [address] [phone]          Discharge Instructions    Diet - low sodium heart healthy    Complete by:  As directed       Discharge Medications     Medication List    TAKE these medications   acetaminophen 500 MG tablet Commonly known as:  TYLENOL Take 1,000 mg by mouth every 6 (six) hours as needed for headache (pain).   allopurinol 100 MG tablet Commonly known as:  ZYLOPRIM Take 100 mg by mouth daily as needed (gout attack).   ALPRAZolam 0.5 MG tablet Commonly known as:  XANAX TAKE 1/2-1 TABLET BY MOUTH AT BEDTIME AS NEEDED What changed:  See the new instructions.   aspirin 81 MG chewable tablet Chew 81 mg by mouth at bedtime.   clopidogrel 75 MG tablet Commonly known as:  PLAVIX TAKE 1 TABLET AT BEDTIME   colchicine 0.6 MG tablet Take 0.6 mg by mouth 2 (two) times daily as needed (gout attack).   fenofibrate 160 MG tablet TAKE 1  TABLET AT BEDTIME   glimepiride 1 MG tablet Commonly known as:  AMARYL Take twice a day with meals What changed:  how much to take  how to take this  when to take this  additional instructions   hydrALAZINE 50 MG tablet Commonly known as:  APRESOLINE Take 1 tablet (50 mg total) by mouth 3 (three) times daily. What changed:  when to take this   Insulin Detemir 100 UNIT/ML Pen Commonly known as:  LEVEMIR FLEXPEN Inject 80 Units into the skin daily at 10 pm. What changed:  how much to take  when to take this   isosorbide mononitrate 30 MG 24 hr tablet Commonly known as:  IMDUR Take 1 tablet (30 mg total) by mouth daily.   metoprolol tartrate 25 MG tablet Commonly known as:  LOPRESSOR Take 50 mg by mouth 2 (two) times daily with a meal. What changed:  Another medication with the same name was removed. Continue taking this medication, and follow the directions you see here.   nitroGLYCERIN 0.4 MG SL tablet Commonly known as:  NITROSTAT Place 1 tablet (0.4 mg total) under the tongue every 5 (five) minutes x 3 doses as needed for chest pain.   OVER THE COUNTER MEDICATION Place 1 drop into both eyes 2 (two) times daily as needed (dry eyes). Over the counter lubricating eye drop   PRESCRIPTION MEDICATION Inhale into the lungs at bedtime. CPAP   torsemide 20 MG tablet Commonly known as:  DEMADEX Take 0.5 tablets (10mg ) on 3/12 then 1 tablet (20mg ) on 11/06/2016. Alternate daily dosing between 10mg  and 20mg . What changed:  how much to take  how to take this  when to take this  additional instructions   traMADol 50 MG tablet Commonly known as:  ULTRAM TAKE 1 TABLET BY MOUTH EVERY 6 HOURS AS NEEDED FOR PAIN.       Allergies Allergies  Allergen Reactions  . Hydrocodone Nausea And Vomiting  . Lisinopril Cough  . Neurontin [Gabapentin] Other (See Comments)    Suicidal thoughts  . Statins Other (See Comments)    Muscle aches  . Metformin And Related Diarrhea  and Other (See Comments)    Intestinal side effects  . Norvasc [Amlodipine Besylate] Swelling    Pedal edema     Outstanding Labs/Studies   Echo and BMET later this week.   Duration of Discharge Encounter  Greater than 30 minutes including physician time.  Signed, Erma Heritage, PA-C 11/04/2016, 11:35 AM

## 2016-11-04 NOTE — Progress Notes (Addendum)
Progress Note  Patient Name: Eugene [name] Date of Encounter: 11/04/2016  Primary Cardiologist: Court Joy MD  Subjective   Feels well. Denies chest pain and shortness of breath. Wants to go home. Said he experienced symptom progression after PCP reduced torsemide to 10 mg daily from alternating days of 20 mg and 10 mg.  Inpatient Medications    Scheduled Meds: . aspirin  81 mg Oral QHS  . clopidogrel  75 mg Oral QHS  . fenofibrate  160 mg Oral QHS  . glimepiride  1 mg Oral BID WC  . hydrALAZINE  50 mg Oral Q8H  . insulin aspart  0-9 Units Subcutaneous TID WC  . isosorbide mononitrate  30 mg Oral Daily  . metoprolol tartrate  50 mg Oral BID WC  . sodium chloride flush  3 mL Intravenous Q12H   Continuous Infusions: . heparin 2,300 Units/hr (11/03/16 2337)   PRN Meds: sodium chloride, acetaminophen, allopurinol, ALPRAZolam, colchicine, nitroGLYCERIN, ondansetron (ZOFRAN) IV, sodium chloride flush, traMADol   Vital Signs    Vitals:   11/03/16 1951 11/03/16 2319 11/04/16 0446 11/04/16 0546  BP: 106/71 100/66 (!) 98/50 98/66  Pulse: 64 64 81 75  Resp: 20 18 18 18   Temp: 97 F (36.1 C) 97 F (36.1 C) 98.4 F (36.9 C)   TempSrc: Oral Oral Oral   SpO2: 95% 95% 96%   Weight:   259 lb 11.2 oz (117.8 kg)   Height:        Intake/Output Summary (Last 24 hours) at 11/04/16 0948 Last data filed at 11/04/16 0900  Gross per 24 hour  Intake          1183.07 ml  Output             1775 ml  Net          -591.93 ml   Filed Weights   11/02/16 2130 11/03/16 0651 11/04/16 0446  Weight: 261 lb 4.8 oz (118.5 kg) 260 lb 14.4 oz (118.3 kg) 259 lb 11.2 oz (117.8 kg)    Telemetry    NSR - Personally Reviewed  ECG     - Personally Reviewed  Physical Exam   GEN: No acute distress.   Neck: No JVD Cardiac: RRR, no murmurs, rubs, or gallops.  Respiratory: Clear to auscultation bilaterally. GI: Soft, nontender, non-distended  MS: No edema; No deformity. Neuro:   Nonfocal  Psych: Normal affect   Labs    Chemistry Recent Labs Lab 11/02/16 1605 11/02/16 2158 11/03/16 0308 11/04/16 0304  NA 136  --  137 136  K 4.4  --  4.0 4.1  CL 106  --  104 100*  CO2 22  --  22 24  GLUCOSE 152*  --  144* 168*  BUN 46*  --  48* 60*  CREATININE 2.15* 2.15* 2.13* 2.71*  CALCIUM 8.7*  --  8.5* 8.6*  GFRNONAA 32* 32* 32* 24*  GFRAA 37* 37* 37* 28*  ANIONGAP 8  --  11 12     Hematology Recent Labs Lab 11/02/16 2158 11/03/16 0308 11/04/16 0304  WBC 8.7 6.5 5.1  RBC 3.81* 3.36* 3.29*  HGB 11.2* 9.9* 9.6*  HCT 34.7* 30.4* 29.9*  MCV 91.1 90.5 90.9  MCH 29.4 29.5 29.2  MCHC 32.3 32.6 32.1  RDW 14.3 14.1 14.3  PLT 247 214 181    Cardiac Enzymes Recent Labs Lab 11/02/16 2158 11/03/16 0308 11/03/16 1028  TROPONINI 3.03* 2.24* 2.18*    Recent Labs Lab 11/02/16  1625  TROPIPOC 1.43*     BNP Recent Labs Lab 11/02/16 1605  BNP 721.9*     DDimer No results for input(s): DDIMER in the last 168 hours.   Radiology    Dg Chest 2 View  Result Date: 11/02/2016 CLINICAL DATA:  Shortness of breath. EXAM: CHEST  2 VIEW COMPARISON:  Two-view chest x-ray 11/09/2015. FINDINGS: The heart is enlarged. There is a mild diffuse interstitial pattern compatible with edema. Small effusions are present. Mild dependent atelectasis is present bilaterally without other focal airspace disease. The visualized soft tissues and bony thorax are unremarkable. IMPRESSION: 1. Cardiomegaly with mild edema and small effusions compatible with congestive heart failure. 2. No significant focal airspace disease. Electronically Signed   By: San Morelle M.D.   On: 11/02/2016 16:27    Cardiac Studies   Echocardiogram 11/03/16:  Severe LVH with LVEF 45-50%. There is mid to apical inferior and   inferoseptal hypokinesis noted. Grade 2 diastolic dysfunction.   Mild left atrial enlargement. Trivial mitral regurgitation.   Mildly calcified aortic annulus. Trivial  tricuspid regurgitation.   Large pericardial effusion noted posteriorly with smaller   collection anteriorly. No obvious right ventricular compromise to   suggest tamponade physiology.  Patient Profile     61 y.o. male with a history of chronic diastolic heart failure, CKD, HLD, DMT2, malignant hypertension, chronic pericardial effusion s/p pericardial window (2014) and CAD s/p DES to [facility] and PDA (2012), STEMI s/p DES to dRCA (2014) who presented to [facility] on 3/9 with progressive SOB.   Assessment & Plan    1. Acute on chronic diastolic CHF: CXR showed CHF and BNP 721. Recently home torsemide was decreased to 10 mg daily by PCP given progressive renal insuffiency. Interestingly, weight is stable from that visit at 259 lbs and better than when he saw me back in 04/2016. Markedly improved from a symptomatic standpoint with over 2L output in last 24 hrs. BUN/Cr rising. BP well controlled at home.  Echocardiogram shows reduction in LV systolic function. Will need outpatient nuclear stress test given elevated troponins.  2. Demand ischemia/?NSTEMI: troponins peaked at 3.03. This is a little higher than I would expect with demand ischemia, but creat up to 2.15. ECG with no acute ST or TW changes. He has had no chest pain.  Echocardiogram shows reduction in LV systolic function. Will need outpatient nuclear stress test given elevated troponins as this may represent an ischemic insult. I will dc heparin.  3. Malignant HTN: BP controlled. Continue hydralazine 50mg  TID.   4. CKD: creat 2.71, BUN 60. His baseline ~2. He already got IV Lasix this morning. I told him to take torsemide 10 mg tomorrow and 20 mg on Tuesday and to alternate like this. Will need BMET this week.  5. Pericardial effusion s/p pericardial window: Large posterior pericardial effusion, no tamponade. Will need a limited echo to reassess effusion.  6. CAD s/p multiple PCI's to RCA: continue ASA/plavix, BB. Statin  intolerant  7. Dyslipidemia: Statin intolerant. On fenofibrate. No longer on Zetia. LDL 118, TC 204, TG 274 on 10/16/16. Will initiate PCSK-9 inhibitors in outpatient setting.   Dispo: Has outpatient follow up with me already scheduled for later this month. Will discharge to home today.  Signed, Kate Sable, MD  11/04/2016, 9:48 AM

## 2016-11-04 NOTE — Progress Notes (Signed)
ANTICOAGULATION CONSULT NOTE - Eugene [name] for Heparin Indication: chest pain/ACS  Allergies  Allergen Reactions  . Hydrocodone Nausea And Vomiting  . Lisinopril Cough  . Neurontin [Gabapentin] Other (See Comments)    Suicidal thoughts  . Statins Other (See Comments)    Muscle aches  . Metformin And Related Diarrhea and Other (See Comments)    Intestinal side effects  . Norvasc [Amlodipine Besylate] Swelling    Pedal edema    Patient Measurements: Height: 6\' 1"  (494.4 cm) Weight: 260 lb 14.4 oz (118.3 kg) IBW/kg (Calculated) : 79.9 Heparin Dosing Weight: 104 kg  Vital Signs: Temp: 97 F (36.1 C) (03/10 2319) Temp Source: Oral (03/10 2319) BP: 100/66 (03/10 2319) Pulse Rate: 64 (03/10 2319)  Labs:  Recent Labs  11/02/16 1605 11/02/16 2158  11/03/16 0308 11/03/16 1028 11/03/16 1932 11/04/16 0304  HGB 10.3* 11.2*  --  9.9*  --   --  9.6*  HCT 32.1* 34.7*  --  30.4*  --   --  29.9*  PLT 197 247  --  214  --   --  181  HEPARINUNFRC  --   --   < > 0.14* 0.19* 0.22* 0.44  CREATININE 2.15* 2.15*  --  2.13*  --   --   --   TROPONINI  --  3.03*  --  2.24* 2.18*  --   --   < > = values in this interval not displayed.  Estimated Creatinine Clearance: 50.3 mL/min (by C-G formula based on SCr of 2.13 mg/dL (H)).  Assessment: 61 y.o. M on heparin for ACS. Heparin level therapeutic (0.44) on gtt at 2300 units/hr. Hgb down to 9.6, plt down to 181. No bleeding noted.   Goal of Therapy:  Heparin level 0.3-0.7 units/ml Monitor platelets by anticoagulation protocol: Yes   Plan:  Continue heparin at 2300 units/hr F/u 6h confirmatory HL  Sherlon Handing, PharmD, BCPS Clinical pharmacist, pager [phone] 11/04/2016,4:06 AM

## 2016-11-05 ENCOUNTER — Telehealth: Payer: Self-pay

## 2016-11-05 DIAGNOSIS — I313 Pericardial effusion (noninflammatory): Secondary | ICD-10-CM

## 2016-11-05 DIAGNOSIS — I3139 Other pericardial effusion (noninflammatory): Secondary | ICD-10-CM

## 2016-11-05 NOTE — Telephone Encounter (Signed)
-----   Message from Erma Heritage, [place] sent at 11/04/2016 11:01 AM EDT ----- Regarding: Repeat Echo and BMET later this week Hi,   This patient was seen by Dr. Bronson Ing while admitted to [facility] and needs a follow-up limited echocardiogram at [facility] this coming Thursday or Friday along with a BMET at that time. The limited echo is for reassessment of his pericardial effusion and BMET for AKI. I did not enter the orders for I know the labs and order sets are different for [facility]. Please make sure these results get sent back to Dr. Bronson Ing.   Thank you for your help!  [place], [place]

## 2016-11-05 NOTE — Telephone Encounter (Signed)
Spoke with pt, echo apt this Friday, 3/16 at 11:15, pt also has lab slip at front desk for bmet and is aware

## 2016-11-09 ENCOUNTER — Ambulatory Visit (HOSPITAL_COMMUNITY)
Admission: RE | Admit: 2016-11-09 | Discharge: 2016-11-09 | Disposition: A | Discharge status: Home / Self Care | Payer: Medicare HMO | Source: Ambulatory Visit | Attending: Cardiovascular Disease | Admitting: Cardiovascular Disease

## 2016-11-09 ENCOUNTER — Other Ambulatory Visit (HOSPITAL_COMMUNITY)
Admission: RE | Admit: 2016-11-09 | Discharge: 2016-11-09 | Disposition: A | Discharge status: Home / Self Care | Payer: Medicare HMO | Source: Ambulatory Visit | Attending: Cardiovascular Disease | Admitting: Cardiovascular Disease

## 2016-11-09 DIAGNOSIS — E119 Type 2 diabetes mellitus without complications: Secondary | ICD-10-CM | POA: Insufficient documentation

## 2016-11-09 DIAGNOSIS — I252 Old myocardial infarction: Secondary | ICD-10-CM | POA: Insufficient documentation

## 2016-11-09 DIAGNOSIS — I2510 Atherosclerotic heart disease of native coronary artery without angina pectoris: Secondary | ICD-10-CM

## 2016-11-09 DIAGNOSIS — I313 Pericardial effusion (noninflammatory): Secondary | ICD-10-CM | POA: Diagnosis not present

## 2016-11-09 DIAGNOSIS — I509 Heart failure, unspecified: Secondary | ICD-10-CM

## 2016-11-09 DIAGNOSIS — E785 Hyperlipidemia, unspecified: Secondary | ICD-10-CM | POA: Insufficient documentation

## 2016-11-09 DIAGNOSIS — I3139 Other pericardial effusion (noninflammatory): Secondary | ICD-10-CM

## 2016-11-09 LAB — BASIC METABOLIC PANEL
Anion gap: 8 (ref 5–15)
BUN: 61 mg/dL — AB (ref 6–20)
CHLORIDE: 103 mmol/L (ref 101–111)
CO2: 25 mmol/L (ref 22–32)
Calcium: 9.4 mg/dL (ref 8.9–10.3)
Creatinine, Ser: 2.45 mg/dL — ABNORMAL HIGH (ref 0.61–1.24)
GFR calc Af Amer: 32 mL/min — ABNORMAL LOW (ref >60)
GFR calc non Af Amer: 27 mL/min — ABNORMAL LOW (ref >60)
GLUCOSE: 160 mg/dL — AB (ref 65–99)
POTASSIUM: 5.2 mmol/L — AB (ref 3.5–5.1)
Sodium: 136 mmol/L (ref 135–145)

## 2016-11-10 ENCOUNTER — Inpatient Hospital Stay (HOSPITAL_COMMUNITY)
Admission: EM | Admit: 2016-11-10 | Discharge: 2016-11-14 | DRG: 280 | Disposition: A | Discharge status: Home / Self Care | Payer: Medicare HMO | Attending: Internal Medicine | Admitting: Internal Medicine

## 2016-11-10 ENCOUNTER — Encounter (HOSPITAL_COMMUNITY): Payer: Self-pay | Admitting: Emergency Medicine

## 2016-11-10 ENCOUNTER — Other Ambulatory Visit: Payer: Self-pay

## 2016-11-10 ENCOUNTER — Emergency Department (HOSPITAL_COMMUNITY): Payer: Medicare HMO

## 2016-11-10 DIAGNOSIS — I3139 Other pericardial effusion (noninflammatory): Secondary | ICD-10-CM | POA: Diagnosis present

## 2016-11-10 DIAGNOSIS — N189 Chronic kidney disease, unspecified: Secondary | ICD-10-CM | POA: Diagnosis not present

## 2016-11-10 DIAGNOSIS — E669 Obesity, unspecified: Secondary | ICD-10-CM | POA: Diagnosis present

## 2016-11-10 DIAGNOSIS — D649 Anemia, unspecified: Secondary | ICD-10-CM | POA: Diagnosis not present

## 2016-11-10 DIAGNOSIS — Z955 Presence of coronary angioplasty implant and graft: Secondary | ICD-10-CM | POA: Diagnosis not present

## 2016-11-10 DIAGNOSIS — R7989 Other specified abnormal findings of blood chemistry: Secondary | ICD-10-CM

## 2016-11-10 DIAGNOSIS — Z888 Allergy status to other drugs, medicaments and biological substances status: Secondary | ICD-10-CM | POA: Diagnosis not present

## 2016-11-10 DIAGNOSIS — I252 Old myocardial infarction: Secondary | ICD-10-CM | POA: Diagnosis not present

## 2016-11-10 DIAGNOSIS — I10 Essential (primary) hypertension: Secondary | ICD-10-CM | POA: Diagnosis present

## 2016-11-10 DIAGNOSIS — I5021 Acute systolic (congestive) heart failure: Secondary | ICD-10-CM

## 2016-11-10 DIAGNOSIS — Z885 Allergy status to narcotic agent status: Secondary | ICD-10-CM

## 2016-11-10 DIAGNOSIS — R0602 Shortness of breath: Secondary | ICD-10-CM | POA: Diagnosis not present

## 2016-11-10 DIAGNOSIS — Z91199 Patient's noncompliance with other medical treatment and regimen due to unspecified reason: Secondary | ICD-10-CM

## 2016-11-10 DIAGNOSIS — E1122 Type 2 diabetes mellitus with diabetic chronic kidney disease: Secondary | ICD-10-CM | POA: Diagnosis not present

## 2016-11-10 DIAGNOSIS — I5023 Acute on chronic systolic (congestive) heart failure: Secondary | ICD-10-CM | POA: Diagnosis not present

## 2016-11-10 DIAGNOSIS — Z823 Family history of stroke: Secondary | ICD-10-CM

## 2016-11-10 DIAGNOSIS — N183 Chronic kidney disease, stage 3 unspecified: Secondary | ICD-10-CM | POA: Diagnosis present

## 2016-11-10 DIAGNOSIS — Z8249 Family history of ischemic heart disease and other diseases of the circulatory system: Secondary | ICD-10-CM

## 2016-11-10 DIAGNOSIS — I214 Non-ST elevation (NSTEMI) myocardial infarction: Secondary | ICD-10-CM | POA: Diagnosis present

## 2016-11-10 DIAGNOSIS — Z9119 Patient's noncompliance with other medical treatment and regimen: Secondary | ICD-10-CM

## 2016-11-10 DIAGNOSIS — I429 Cardiomyopathy, unspecified: Secondary | ICD-10-CM | POA: Diagnosis present

## 2016-11-10 DIAGNOSIS — Z7982 Long term (current) use of aspirin: Secondary | ICD-10-CM

## 2016-11-10 DIAGNOSIS — Z794 Long term (current) use of insulin: Secondary | ICD-10-CM

## 2016-11-10 DIAGNOSIS — E785 Hyperlipidemia, unspecified: Secondary | ICD-10-CM | POA: Diagnosis present

## 2016-11-10 DIAGNOSIS — E1121 Type 2 diabetes mellitus with diabetic nephropathy: Secondary | ICD-10-CM | POA: Diagnosis not present

## 2016-11-10 DIAGNOSIS — N179 Acute kidney failure, unspecified: Secondary | ICD-10-CM | POA: Diagnosis present

## 2016-11-10 DIAGNOSIS — I2510 Atherosclerotic heart disease of native coronary artery without angina pectoris: Secondary | ICD-10-CM | POA: Diagnosis not present

## 2016-11-10 DIAGNOSIS — I5033 Acute on chronic diastolic (congestive) heart failure: Secondary | ICD-10-CM | POA: Diagnosis present

## 2016-11-10 DIAGNOSIS — J449 Chronic obstructive pulmonary disease, unspecified: Secondary | ICD-10-CM | POA: Diagnosis present

## 2016-11-10 DIAGNOSIS — I313 Pericardial effusion (noninflammatory): Secondary | ICD-10-CM | POA: Diagnosis present

## 2016-11-10 DIAGNOSIS — R748 Abnormal levels of other serum enzymes: Secondary | ICD-10-CM | POA: Diagnosis not present

## 2016-11-10 DIAGNOSIS — Z79899 Other long term (current) drug therapy: Secondary | ICD-10-CM

## 2016-11-10 DIAGNOSIS — R778 Other specified abnormalities of plasma proteins: Secondary | ICD-10-CM | POA: Diagnosis present

## 2016-11-10 DIAGNOSIS — K219 Gastro-esophageal reflux disease without esophagitis: Secondary | ICD-10-CM | POA: Diagnosis present

## 2016-11-10 DIAGNOSIS — Z833 Family history of diabetes mellitus: Secondary | ICD-10-CM

## 2016-11-10 DIAGNOSIS — I25119 Atherosclerotic heart disease of native coronary artery with unspecified angina pectoris: Secondary | ICD-10-CM | POA: Diagnosis not present

## 2016-11-10 DIAGNOSIS — R0603 Acute respiratory distress: Secondary | ICD-10-CM | POA: Diagnosis present

## 2016-11-10 DIAGNOSIS — I110 Hypertensive heart disease with heart failure: Secondary | ICD-10-CM | POA: Diagnosis not present

## 2016-11-10 DIAGNOSIS — Z6834 Body mass index (BMI) 34.0-34.9, adult: Secondary | ICD-10-CM

## 2016-11-10 DIAGNOSIS — E782 Mixed hyperlipidemia: Secondary | ICD-10-CM | POA: Diagnosis not present

## 2016-11-10 DIAGNOSIS — E119 Type 2 diabetes mellitus without complications: Secondary | ICD-10-CM

## 2016-11-10 DIAGNOSIS — Z9861 Coronary angioplasty status: Secondary | ICD-10-CM | POA: Diagnosis present

## 2016-11-10 DIAGNOSIS — I130 Hypertensive heart and chronic kidney disease with heart failure and stage 1 through stage 4 chronic kidney disease, or unspecified chronic kidney disease: Principal | ICD-10-CM | POA: Diagnosis present

## 2016-11-10 DIAGNOSIS — R0902 Hypoxemia: Secondary | ICD-10-CM | POA: Diagnosis present

## 2016-11-10 HISTORY — DX: Pericardial effusion (noninflammatory): I31.3

## 2016-11-10 HISTORY — DX: Anemia, unspecified: D64.9

## 2016-11-10 HISTORY — DX: Other pericardial effusion (noninflammatory): I31.39

## 2016-11-10 HISTORY — DX: Chronic diastolic (congestive) heart failure: I50.32

## 2016-11-10 LAB — CBC WITH DIFFERENTIAL/PLATELET
Basophils Absolute: 0 10*3/uL (ref 0.0–0.1)
Basophils Relative: 0 %
Eosinophils Absolute: 0.2 10*3/uL (ref 0.0–0.7)
Eosinophils Relative: 3 %
HEMATOCRIT: 33.4 % — AB (ref 39.0–52.0)
HEMOGLOBIN: 10.8 g/dL — AB (ref 13.0–17.0)
LYMPHS ABS: 1.8 10*3/uL (ref 0.7–4.0)
Lymphocytes Relative: 25 %
MCH: 30.3 pg (ref 26.0–34.0)
MCHC: 32.3 g/dL (ref 30.0–36.0)
MCV: 93.6 fL (ref 78.0–100.0)
MONOS PCT: 8 %
Monocytes Absolute: 0.6 10*3/uL (ref 0.1–1.0)
NEUTROS ABS: 4.8 10*3/uL (ref 1.7–7.7)
NEUTROS PCT: 64 %
Platelets: 281 10*3/uL (ref 150–400)
RBC: 3.57 MIL/uL — ABNORMAL LOW (ref 4.22–5.81)
RDW: 14.9 % (ref 11.5–15.5)
WBC: 7.5 10*3/uL (ref 4.0–10.5)

## 2016-11-10 LAB — COMPREHENSIVE METABOLIC PANEL
ALK PHOS: 45 U/L (ref 38–126)
ALT: 24 U/L (ref 17–63)
ANION GAP: 9 (ref 5–15)
AST: 26 U/L (ref 15–41)
Albumin: 3.8 g/dL (ref 3.5–5.0)
BILIRUBIN TOTAL: 0.8 mg/dL (ref 0.3–1.2)
BUN: 61 mg/dL — ABNORMAL HIGH (ref 6–20)
CALCIUM: 9.4 mg/dL (ref 8.9–10.3)
CO2: 23 mmol/L (ref 22–32)
Chloride: 105 mmol/L (ref 101–111)
Creatinine, Ser: 2.36 mg/dL — ABNORMAL HIGH (ref 0.61–1.24)
GFR, EST AFRICAN AMERICAN: 33 mL/min — AB (ref >60)
GFR, EST NON AFRICAN AMERICAN: 28 mL/min — AB (ref >60)
Glucose, Bld: 80 mg/dL (ref 65–99)
Potassium: 4.8 mmol/L (ref 3.5–5.1)
Sodium: 137 mmol/L (ref 135–145)
TOTAL PROTEIN: 7.3 g/dL (ref 6.5–8.1)

## 2016-11-10 LAB — GLUCOSE, CAPILLARY: Glucose-Capillary: 77 mg/dL (ref 65–99)

## 2016-11-10 LAB — TROPONIN I: TROPONIN I: 0.54 ng/mL — AB (ref <0.03)

## 2016-11-10 LAB — BRAIN NATRIURETIC PEPTIDE: B Natriuretic Peptide: 974 pg/mL — ABNORMAL HIGH (ref 0.0–100.0)

## 2016-11-10 MED ORDER — FENOFIBRATE 160 MG PO TABS
160.0000 mg | ORAL_TABLET | Freq: Every day | ORAL | Status: DC
  Administered 2016-11-10 – 2016-11-13 (×4): 160 mg via ORAL
  Filled 2016-11-10 (×4): qty 1

## 2016-11-10 MED ORDER — INSULIN DETEMIR 100 UNIT/ML ~~LOC~~ SOLN
60.0000 [IU] | Freq: Every day | SUBCUTANEOUS | Status: DC
  Administered 2016-11-11: 60 [IU] via SUBCUTANEOUS
  Filled 2016-11-10 (×2): qty 0.6

## 2016-11-10 MED ORDER — SODIUM CHLORIDE 0.9% FLUSH
3.0000 mL | Freq: Two times a day (BID) | INTRAVENOUS | Status: DC
  Administered 2016-11-10 – 2016-11-14 (×8): 3 mL via INTRAVENOUS

## 2016-11-10 MED ORDER — FUROSEMIDE 10 MG/ML IJ SOLN
40.0000 mg | Freq: Two times a day (BID) | INTRAMUSCULAR | Status: DC
  Administered 2016-11-10 – 2016-11-12 (×5): 40 mg via INTRAVENOUS
  Filled 2016-11-10 (×5): qty 4

## 2016-11-10 MED ORDER — ALLOPURINOL 100 MG PO TABS: 100.0000 mg | ORAL_TABLET | Freq: Every day | ORAL | Status: DC | PRN

## 2016-11-10 MED ORDER — ONDANSETRON HCL 4 MG/2ML IJ SOLN: 4.0000 mg | Freq: Four times a day (QID) | INTRAMUSCULAR | Status: DC | PRN

## 2016-11-10 MED ORDER — HYDRALAZINE HCL 25 MG PO TABS
50.0000 mg | ORAL_TABLET | Freq: Two times a day (BID) | ORAL | Status: DC
  Administered 2016-11-10 – 2016-11-11 (×2): 50 mg via ORAL
  Filled 2016-11-10 (×4): qty 2

## 2016-11-10 MED ORDER — ISOSORBIDE MONONITRATE ER 60 MG PO TB24
30.0000 mg | ORAL_TABLET | Freq: Every day | ORAL | Status: DC
  Administered 2016-11-11 – 2016-11-14 (×4): 30 mg via ORAL
  Filled 2016-11-10 (×4): qty 1

## 2016-11-10 MED ORDER — CLOPIDOGREL BISULFATE 75 MG PO TABS
75.0000 mg | ORAL_TABLET | Freq: Every day | ORAL | Status: DC
  Administered 2016-11-10 – 2016-11-13 (×4): 75 mg via ORAL
  Filled 2016-11-10 (×4): qty 1

## 2016-11-10 MED ORDER — ACETAMINOPHEN 325 MG PO TABS: 650.0000 mg | ORAL_TABLET | ORAL | Status: DC | PRN

## 2016-11-10 MED ORDER — METOPROLOL TARTRATE 50 MG PO TABS
50.0000 mg | ORAL_TABLET | Freq: Two times a day (BID) | ORAL | Status: DC
  Administered 2016-11-11 – 2016-11-14 (×7): 50 mg via ORAL
  Filled 2016-11-10 (×7): qty 1

## 2016-11-10 MED ORDER — INSULIN ASPART 100 UNIT/ML ~~LOC~~ SOLN
0.0000 [IU] | Freq: Three times a day (TID) | SUBCUTANEOUS | Status: DC
  Administered 2016-11-11 (×2): 2 [IU] via SUBCUTANEOUS
  Administered 2016-11-13: 3 [IU] via SUBCUTANEOUS
  Administered 2016-11-14: 1 [IU] via SUBCUTANEOUS

## 2016-11-10 MED ORDER — ASPIRIN 81 MG PO CHEW
81.0000 mg | CHEWABLE_TABLET | Freq: Every day | ORAL | Status: DC
  Administered 2016-11-10 – 2016-11-13 (×4): 81 mg via ORAL
  Filled 2016-11-10 (×4): qty 1

## 2016-11-10 MED ORDER — SODIUM CHLORIDE 0.9 % IV SOLN: 250.0000 mL | INTRAVENOUS | Status: DC | PRN

## 2016-11-10 MED ORDER — FUROSEMIDE 10 MG/ML IJ SOLN
80.0000 mg | Freq: Once | INTRAMUSCULAR | Status: AC
  Administered 2016-11-10: 80 mg via INTRAVENOUS
  Filled 2016-11-10: qty 8

## 2016-11-10 MED ORDER — SODIUM CHLORIDE 0.9% FLUSH: 3.0000 mL | INTRAVENOUS | Status: DC | PRN

## 2016-11-10 NOTE — ED Triage Notes (Signed)
Pt reports increasing SOB, bilateral lower extremity, and abdominal distention x 2 days. Pt hx of CHF.

## 2016-11-10 NOTE — ED Notes (Signed)
Trop of 0.54 called from the lab- Dr Darreld Mclean informed

## 2016-11-10 NOTE — H&P (Signed)
History and Physical    Eugene [name] OIZ:[id number] DOB: 05-02-1956 DOA: 11/10/2016  PCP: [name], MD  Patient coming from:  home  Chief Complaint:  Sob, swelling  HPI: Eugene [name] is a 61 y.o. male with medical history significant of recent NSTEMI at [facility] last week, CHF, pericardial effusion, COPD, DM, HTN just d/ced 3/11 from [facility] service comes in with worsening le edema and DOE.  No chest pain.  No fevers.  No cough.  No pnd or orthopnea.  He reports his legs had hardly any swelling on 3/11 when he went home.  He takes his medications like he is suppose too, he watches his salt intake.  He reports he use to be on toresimide 20mg  daily up until about 3 weeks ago, ever since his pcp decreased his dose he has been having problems.  His dose was decreased because of a uti.  Pt has a trop over 3 8 days ago, today it is 0.5.  Pt referred for admission for chf exacerbation.  His oxgyen sats were 87% at rest in the ED.  He is not on home supplemental oxgyen.   Review of Systems: As per HPI otherwise 10 point review of systems negative.   Past Medical History:  Diagnosis Date  . Anginal pain ([place])   . Anxiety   . Arteriosclerotic cardiovascular disease (ASCVD)    a. 05/2011 Cath/PCI: LM nl, LAD 57m, D1 small, D2 small 66m, LCX large 40p, RCA 50-60p, 99 hazy @ origin of PDA with 70-80 in PDA (2.5x26 Resolute Integrity & 3.0x15 Resolute Integrity DES).;  b. 08/2012 Inflat  STEMI/Cath/PCI: LM minor irregs, LAD 50p, D1 50, LCX nl, OM1 25, RCA 30-40p, 100d (treated with 2.75x21mm Promus Premier DES);  c. 08/2012 Echo: EF 55-60%, basal inferopost HK.  . Asthma   . Bell palsy   . C. difficile colitis    a. 08/2012  . CHF (congestive heart failure) ([place])   . Cholelithiasis 07/2012   Asymptomatic; identified incidentally  . Contrast dye induced nephropathy    a. 08/2012 post cath/pci  . COPD (chronic obstructive pulmonary disease) ([place])   . Diabetes mellitus    Peripheral neuropathy  . Gallstones   . GERD (gastroesophageal reflux disease)   . Heart disease   . Hyperlipidemia   . Hypertension   . Myocardial infarct 09/08/12  . Nephrolithiasis   . Old myocardial infarction   . PONV (postoperative nausea and vomiting)   . Sleep apnea     Past Surgical History:  Procedure Laterality Date  . CARDIAC CATHETERIZATION    . CHEST TUBE INSERTION    . CIRCUMCISION    . COLONOSCOPY     In [facility], approximately 2011 per patient, was normal. Advised to come back in 10 years.  . ESOPHAGOGASTRODUODENOSCOPY     in [place] over 20 yrs ago  . ESOPHAGOGASTRODUODENOSCOPY  06/12/2012   PJA:SNKNLZ esophagus-status post Venia Minks dilation. Abnormal gastric mucosa of uncertain significance-status post biopsy  . INTRAOPERATIVE TRANSESOPHAGEAL ECHOCARDIOGRAM N/A 06/23/2013   Procedure: INTRAOPERATIVE TRANSESOPHAGEAL ECHOCARDIOGRAM;  Surgeon: Grace Isaac, MD;  Location: [facility];  Service: Open Heart Surgery;  Laterality: N/A;  . LEFT HEART CATHETERIZATION WITH CORONARY ANGIOGRAM N/A 09/08/2012   Procedure: LEFT HEART CATHETERIZATION WITH CORONARY ANGIOGRAM;  Surgeon: [name], MD;  Location: [facility] CATH LAB;  Service: Cardiovascular;  Laterality: N/A;  . stents    . SUBXYPHOID PERICARDIAL WINDOW N/A 06/23/2013   Procedure: SUBXYPHOID PERICARDIAL WINDOW;  Surgeon:  Grace Isaac, MD;  Location: [facility];  Service: Thoracic;  Laterality: N/A;     reports that he has never smoked. He has never used smokeless tobacco. He reports that he does not drink alcohol or use drugs.  Allergies  Allergen Reactions  . Hydrocodone Nausea And Vomiting  . Lisinopril Cough  . Neurontin [Gabapentin] Other (See Comments)    Reaction:  Suicidal thoughts   . Statins Other (See Comments)    Reaction:  Muscle pain   . Metformin And Related Diarrhea  . Norvasc [Amlodipine Besylate] Swelling and Other (See Comments)    Reaction:  Pedal edema    Family History    Problem Relation Age of Onset  . Diabetes Mother   . Heart attack Mother   . Stroke Mother   . Diabetes Sister   . Sleep apnea Sister   . Hypertension Brother   . Diabetes Brother   . Diabetes Brother   . Hypertension Brother   . Colon cancer Neg Hx   . Liver disease Neg Hx     Prior to Admission medications   Medication Sig Start Date End Date Taking? Authorizing Provider  acetaminophen (TYLENOL) 500 MG tablet Take 1,000 mg by mouth every 6 (six) hours as needed for mild pain, moderate pain, fever or headache.    Yes Historical Provider, MD  allopurinol (ZYLOPRIM) 100 MG tablet Take 100 mg by mouth daily as needed (for gout flares).    Yes Historical Provider, MD  ALPRAZolam (XANAX) 0.5 MG tablet TAKE 1/2-1 TABLET BY MOUTH AT BEDTIME AS NEEDED Patient taking differently: TAKE 1/2-1 TABLET BY MOUTH AT BEDTIME AS NEEDED FOR SLEEP 10/01/16  Yes [name], MD  aspirin 81 MG chewable tablet Chew 81 mg by mouth at bedtime.   Yes Historical Provider, MD  carboxymethylcellulose (REFRESH PLUS) 0.5 % SOLN Place 1 drop into both eyes 3 (three) times daily as needed.   Yes Historical Provider, MD  clopidogrel (PLAVIX) 75 MG tablet TAKE 1 TABLET AT BEDTIME 09/07/16  Yes [name], MD  colchicine 0.6 MG tablet Take 0.6 mg by mouth 2 (two) times daily as needed (for gout flares).    Yes Historical Provider, MD  fenofibrate 160 MG tablet TAKE 1 TABLET AT BEDTIME 04/09/16  Yes [name], MD  glimepiride (AMARYL) 1 MG tablet Take twice a day with meals Patient taking differently: Take 1 mg by mouth 2 (two) times daily with a meal.  10/22/16  Yes [name], MD  hydrALAZINE (APRESOLINE) 50 MG tablet Take 1 tablet (50 mg total) by mouth 3 (three) times daily. Patient taking differently: Take 50 mg by mouth 2 (two) times daily.  11/04/16  Yes Erma Heritage, PA  Insulin Detemir (LEVEMIR FLEXPEN) 100 UNIT/ML Pen Inject 80 Units into the skin daily at 10 pm. Patient taking differently:  Inject 60 Units into the skin at bedtime.  09/03/16  Yes [name], MD  isosorbide mononitrate (IMDUR) 30 MG 24 hr tablet Take 1 tablet (30 mg total) by mouth daily. 06/16/16  Yes Erma Heritage, PA  metoprolol tartrate (LOPRESSOR) 25 MG tablet Take 50 mg by mouth 2 (two) times daily with a meal.   Yes Historical Provider, MD  nitroGLYCERIN (NITROSTAT) 0.4 MG SL tablet Place 1 tablet (0.4 mg total) under the tongue every 5 (five) minutes x 3 doses as needed for chest pain. 06/15/16  Yes Erma Heritage, PA  torsemide (DEMADEX) 20 MG tablet Take  0.5 tablets (10mg ) on 3/12 then 1 tablet (20mg ) on 11/06/2016. Alternate daily dosing between 10mg  and 20mg . Patient taking differently: Take 10-20 mg by mouth See admin instructions. Pt alternates his dose daily between 10mg  and 20mg . 11/04/16  Yes Erma Heritage, PA  traMADol (ULTRAM) 50 MG tablet TAKE 1 TABLET BY MOUTH EVERY 6 HOURS AS NEEDED FOR PAIN. 10/10/16  Yes [name], MD    Physical Exam: Vitals:   11/10/16 1917 11/10/16 1930 11/10/16 2000 11/10/16 2030  BP: 140/82 (!) 165/84 (!) 145/79 (!) 152/85  Pulse: 82  73 70  Resp: 18 (!) 23 17 17   Temp: 97.8 F (36.6 C)     TempSrc: Oral     SpO2: 94%  93% 93%  Weight:      Height:        Constitutional: NAD, calm, comfortable Vitals:   11/10/16 1917 11/10/16 1930 11/10/16 2000 11/10/16 2030  BP: 140/82 (!) 165/84 (!) 145/79 (!) 152/85  Pulse: 82  73 70  Resp: 18 (!) 23 17 17   Temp: 97.8 F (36.6 C)     TempSrc: Oral     SpO2: 94%  93% 93%  Weight:      Height:       Eyes: PERRL, lids and conjunctivae normal ENMT: Mucous membranes are moist. Posterior pharynx clear of any exudate or lesions.Normal dentition.  Neck: normal, supple, no masses, no thyromegaly Respiratory: clear to auscultation bilaterally, no wheezing, no crackles. Normal respiratory effort. No accessory muscle use.  Cardiovascular: Regular rate and rhythm, no murmurs / rubs / gallops. 2+ BLE extremity  edema. 2+ pedal pulses. No carotid bruits.  Abdomen: no tenderness, no masses palpated. No hepatosplenomegaly. Bowel sounds positive.  Musculoskeletal: no clubbing / cyanosis. No joint deformity upper and lower extremities. Good ROM, no contractures. Normal muscle tone.  Skin: no rashes, lesions, ulcers. No induration Neurologic: CN 2-12 grossly intact. Sensation intact, DTR normal. Strength 5/5 in all 4.  Psychiatric: Normal judgment and insight. Alert and oriented x 3. Normal mood.    Labs on Admission: I have personally reviewed following labs and imaging studies  CBC:  Recent Labs Lab 11/04/16 0304 11/10/16 1920  WBC 5.1 7.5  NEUTROABS  --  4.8  HGB 9.6* 10.8*  HCT 29.9* 33.4*  MCV 90.9 93.6  PLT 181 427   Basic Metabolic Panel:  Recent Labs Lab 11/04/16 0304 11/09/16 1140 11/10/16 1920  NA 136 136 137  K 4.1 5.2* 4.8  CL 100* 103 105  CO2 24 25 23   GLUCOSE 168* 160* 80  BUN 60* 61* 61*  CREATININE 2.71* 2.45* 2.36*  CALCIUM 8.6* 9.4 9.4   GFR: Estimated Creatinine Clearance: 45.3 mL/min (A) (by C-G formula based on SCr of 2.36 mg/dL (H)). Liver Function Tests:  Recent Labs Lab 11/10/16 1920  AST 26  ALT 24  ALKPHOS 45  BILITOT 0.8  PROT 7.3  ALBUMIN 3.8   Cardiac Enzymes:  Recent Labs Lab 11/10/16 1920  TROPONINI 0.54*   CBG:  Recent Labs Lab 11/03/16 2048 11/04/16 0553 11/04/16 0757 11/04/16 1200  GLUCAP 174* 153* 159* 193*    Radiological Exams on Admission: Dg Chest 2 View  Result Date: 11/10/2016 CLINICAL DATA:  Short of breath, congestive heart failure. EXAM: CHEST  2 VIEW COMPARISON:  11/02/2016 FINDINGS: Normal cardiac silhouette. There is bibasilar linear nodular airspace opacities slightly improved from comparison exam. Small effusions noted. Degenerative osteophytosis of the spine. IMPRESSION: Mild improvement in interstitial and pulmonary edema  pattern. Electronically Signed   By: Suzy Bouchard M.D.   On: 11/10/2016 18:05     EKG: Independently reviewed.  nsr no acute issues  Assessment/Plan 61 yo male with acute on chronic CHF exac with down trending troponin from NSTEMI last week  Principal Problem:   Acute systolic CHF (congestive heart failure), NYHA class 3 ([place])- I suspect his diuretics were recently decreased due to renal function.  He was discharged on demadex 20mg  tablets taking 10 alternating with 20 every other day.  He was doing well with 20mg  daily before.  Place on lasix 40mg  iv q 12 hours (adjust down in next 24 to 48 hours ) given 80mg  iv in ED.  Will likely have to accept some degree of worsening CKD and put back on demadex 20mg  po bid at discharge depending on his renal function response.  Pt had follow up echo yesterday for his pericardial effusion which appears to be improved.  Active Problems:   DM type 2 (diabetes mellitus, type 2) ([place])- SSI   Essential hypertension- stable   CAD- MI/RCA PCI-DES x2 2012, and RCA DES Jan 2014- stable   Noncompliance- pt reports he is compliant and has no idea why this is in his chart   Pericardial effusion- improving per echo done yesterday   SOB (shortness of breath)- due to above   Chronic renal insufficiency, stage III (moderate)- noted, improved from last week   Elevated troponin- down trending from trop of 3 8 days ago    DVT prophylaxis:  scds  Code Status:  full Family Communication:  none  Disposition Plan:  Per day team Consults called:  None, cardiology called by [facility]P at [facility] recommended patient could be managed here or at [facility], I opted to keep patient here at [facility] Admission status:   admission   DAVID,RACHAL A MD [facility]  If 7PM-7AM, please contact night-coverage [email or web address] Password [facility]  11/10/2016, 9:27 PM

## 2016-11-10 NOTE — ED Notes (Signed)
Reports breathing much better

## 2016-11-10 NOTE — ED Notes (Signed)
Report to [place], [place]

## 2016-11-10 NOTE — ED Provider Notes (Signed)
[name] DEPT Provider Note   CSN: [id number] Arrival date & time: 11/10/16  1709     History   Chief Complaint Chief Complaint  Patient presents with  . Shortness of Breath    HPI Eugene [name] is a 61 y.o. male.  HPI Patient with history of CHF and chronic precordial effusion. Recently admitted for shortness of breath. Diuresed with improvement of his symptoms. States he was discharged 2 days ago. He's had increased swelling in his bilateral lower extremities as well as dyspnea with exertion and lying flat. He's had cough as been nonproductive. Denies any fever or chills. Denies chest pain. Past Medical History:  Diagnosis Date  . Anemia   . Anxiety   . Arteriosclerotic cardiovascular disease (ASCVD)    a. 05/2011 s/p DES to PDA and RCA. b. 08/2012 Inflat  STEMI/Cath/PCI: LM minor irregs, LAD 50p, D1 50, LCX nl, OM1 25, RCA 30-40p, 100d (treated with 2.75x9mm Promus Premier DES);    [name] Asthma   . Bell palsy   . C. difficile colitis    a. 08/2012  . Cholelithiasis 07/2012   Asymptomatic; identified incidentally  . Chronic diastolic CHF (congestive heart failure) ([place])   . Contrast dye induced nephropathy    a. 08/2012 post cath/pci  . COPD (chronic obstructive pulmonary disease) ([place])   . Diabetes mellitus    Peripheral neuropathy  . Gallstones   . GERD (gastroesophageal reflux disease)   . Hyperlipidemia   . Hypertension   . Myocardial infarct 09/08/12  . Nephrolithiasis   . Old myocardial infarction   . Pericardial effusion    a. s/p window in 2014.  [name] PONV (postoperative nausea and vomiting)   . Sleep apnea     Patient Active Problem List   Diagnosis Date Noted  . DM type 2 causing CKD stage 3 ([place]) 11/14/2016  . Acute on chronic systolic congestive heart failure ([place])   . Cardiomyopathy ([place])   . Acute on chronic diastolic CHF (congestive heart failure) ([place]) 11/10/2016  . Elevated troponin   . Coronary artery disease involving native coronary  artery of native heart without angina pectoris   . Moderate nonproliferative diabetic retinopathy of both eyes without macular edema associated with type 2 diabetes mellitus ([place]) 10/22/2016  . Hyperkalemia 06/14/2016  . Non-ST elevation myocardial infarction (NSTEMI) ([place])   . Morbid obesity ([place])   . Diabetes mellitus type 2, uncontrolled, with complications ([place]) 53/97/6734  . Right carotid bruit 11/10/2015  . Pain in the chest   . Severe hypertension 11/09/2015  . Hypertensive urgency 11/09/2015  . Gout of wrist 08/08/2015  . Pericardial effusion without cardiac tamponade 05/26/2015  . HLD (hyperlipidemia) 05/26/2015  . Anxiety 05/26/2015  . Olecranon bursitis   . Inflammatory arthritis   . Elbow joint pain 01/21/2015  . SOB (shortness of breath) on exertion 12/26/2014  . Atypical chest pain 12/25/2014  . Constipation 10/26/2014  . Abdominal pain 10/26/2014  . Peripheral edema 01/29/2014  . Acute on chronic diastolic heart failure ([place]) 01/26/2014  . Acute on chronic renal failure ([place]) 01/26/2014  . Acute kidney injury superimposed on chronic kidney disease ([place]) 12/14/2013  . Chest pain 12/13/2013  . SOB (shortness of breath) 10/16/2013  . Dyspnea 10/15/2013  . Chronic diastolic CHF (congestive heart failure) ([place]) 10/15/2013  . NSVT (nonsustained ventricular tachycardia) ([place]) 06/26/2013  . Type 2 diabetes mellitus with neurological manifestations ([place]) 06/26/2013  . Acute diastolic CHF (congestive heart failure) ([place]) 06/26/2013  . Pericardial effusion 06/26/2013  .  Old myocardial infarction   . Gastroparesis due to DM ([place]) 05/21/2013  . CAD (coronary artery disease) 09/16/2012  . Acute on chronic renal insufficiency 09/14/2012  . Edema 08/18/2012  . Anemia, normocytic normochromic 07/31/2012  . Cholelithiasis 07/27/2012  . Gastroesophageal reflux disease 05/10/2012  . Noncompliance 05/09/2012  . Peripheral neuropathy ([place]) 10/24/2011  . Obstructive sleep apnea- on  C-pap 08/23/2011  . Hyponatremia 08/23/2011  . CAD- MI/RCA PCI-DES x2 2012, and RCA DES Jan 2014   . Hyperlipidemia   . Cholelithiasis 05/28/2011  . DM type 2 (diabetes mellitus, type 2) ([place]) 05/25/2011  . Essential hypertension 05/25/2011    Past Surgical History:  Procedure Laterality Date  . CARDIAC CATHETERIZATION    . CHEST TUBE INSERTION    . CIRCUMCISION    . COLONOSCOPY     In [facility], approximately 2011 per patient, was normal. Advised to come back in 10 years.  . ESOPHAGOGASTRODUODENOSCOPY     in [place] over 20 yrs ago  . ESOPHAGOGASTRODUODENOSCOPY  06/12/2012   KKX:[id number] esophagus-status post Venia Minks dilation. Abnormal gastric mucosa of uncertain significance-status post biopsy  . INTRAOPERATIVE TRANSESOPHAGEAL ECHOCARDIOGRAM N/A 06/23/2013   Procedure: INTRAOPERATIVE TRANSESOPHAGEAL ECHOCARDIOGRAM;  Surgeon: Grace Isaac, MD;  Location: [facility];  Service: Open Heart Surgery;  Laterality: N/A;  . LEFT HEART CATHETERIZATION WITH CORONARY ANGIOGRAM N/A 09/08/2012   Procedure: LEFT HEART CATHETERIZATION WITH CORONARY ANGIOGRAM;  Surgeon: [name], MD;  Location[facility] LAB;  Service: Cardiovascular;  Laterality: N/A;  . stents    . SUBXYPHOID PERICARDIAL WINDOW N/A 06/23/2013   Procedure: SUBXYPHOID PERICARDIAL WINDOW;  Surgeon: Grace Isaac, MD;  Location[facility];  Service: Thoracic;  Laterality: N/A;       Home Medications    Prior to Admission medications   Medication Sig Start Date End Date Taking? Authorizing Provider  acetaminophen (TYLENOL) 500 MG tablet Take 1,000 mg by mouth every 6 (six) hours as needed for mild pain, moderate pain, fever or headache.    Yes Historical Provider, MD  allopurinol (ZYLOPRIM) 100 MG tablet Take 100 mg by mouth daily as needed (for gout flares).    Yes Historical Provider, MD  ALPRAZolam (XANAX) 0.5 MG tablet TAKE 1/2-1 TABLET BY MOUTH AT BEDTIME AS NEEDED Patient taking differently: TAKE 1/2-1  TABLET BY MOUTH AT BEDTIME AS NEEDED FOR SLEEP 10/01/16  Yes [name], MD  aspirin 81 MG chewable tablet Chew 81 mg by mouth at bedtime.   Yes Historical Provider, MD  carboxymethylcellulose (REFRESH PLUS) 0.5 % SOLN Place 1 drop into both eyes 3 (three) times daily as needed.   Yes Historical Provider, MD  clopidogrel (PLAVIX) 75 MG tablet TAKE 1 TABLET AT BEDTIME 09/07/16  Yes [name], MD  colchicine 0.6 MG tablet Take 0.6 mg by mouth 2 (two) times daily as needed (for gout flares).    Yes Historical Provider, MD  fenofibrate 160 MG tablet TAKE 1 TABLET AT BEDTIME 04/09/16  Yes [name], MD  glimepiride (AMARYL) 1 MG tablet Take twice a day with meals Patient taking differently: Take 1 mg by mouth 2 (two) times daily with a meal.  10/22/16  Yes [name], MD  metoprolol tartrate (LOPRESSOR) 25 MG tablet Take 50 mg by mouth 2 (two) times daily with a meal.   Yes Historical Provider, MD  nitroGLYCERIN (NITROSTAT) 0.4 MG SL tablet Place 1 tablet (0.4 mg total) under the tongue every 5 (five) minutes x 3 doses as  needed for chest pain. 06/15/16  Yes Erma Heritage, PA  traMADol (ULTRAM) 50 MG tablet TAKE 1 TABLET BY MOUTH EVERY 6 HOURS AS NEEDED FOR PAIN. 10/10/16  Yes [name], MD  Insulin Detemir (LEVEMIR FLEXPEN) 100 UNIT/ML Pen Inject 60 Units into the skin at bedtime. 11/14/16   Kathie Dike, MD  isosorbide mononitrate (IMDUR) 60 MG 24 hr tablet Take 1 tablet (60 mg total) by mouth daily. 11/14/16   Kathie Dike, MD  torsemide (DEMADEX) 20 MG tablet Take 1 tablet (20 mg total) by mouth daily. 11/14/16   Kathie Dike, MD    Family History Family History  Problem Relation Age of Onset  . Diabetes Mother   . Heart attack Mother   . Stroke Mother   . Diabetes Sister   . Sleep apnea Sister   . Hypertension Brother   . Diabetes Brother   . Diabetes Brother   . Hypertension Brother   . Colon cancer Neg Hx   . Liver disease Neg Hx     Social History Social  History  Substance Use Topics  . Smoking status: Never Smoker  . Smokeless tobacco: Never Used  . Alcohol use No     Comment: heavy etoh use 30 years ago     Allergies   Hydrocodone; Lisinopril; Neurontin [gabapentin]; Statins; Metformin and related; and Norvasc [amlodipine besylate]   Review of Systems Review of Systems  Constitutional: Negative for chills, fatigue and fever.  Respiratory: Positive for cough and shortness of breath. Negative for wheezing.   Cardiovascular: Positive for leg swelling. Negative for chest pain and palpitations.  Gastrointestinal: Negative for abdominal pain, diarrhea, nausea and vomiting.  Genitourinary: Negative for dysuria, flank pain and frequency.  Musculoskeletal: Negative for back pain, myalgias, neck pain and neck stiffness.  Skin: Negative for rash and wound.  Neurological: Negative for dizziness, weakness, light-headedness, numbness and headaches.  All other systems reviewed and are negative.    Physical Exam Updated Vital Signs BP 132/84 (BP Location: Left Arm)   Pulse 67   Temp 97.9 F (36.6 C) (Oral)   Resp 18   Ht 6\' 1"  (1.854 m)   Wt 261 lb 3.2 oz (118.5 kg)   SpO2 96%   BMI 34.46 kg/m   Physical Exam  Constitutional: He is oriented to person, place, and time. He appears well-developed and well-nourished. No distress.  HENT:  Head: Normocephalic and atraumatic.  Mouth/Throat: Oropharynx is clear and moist.  Eyes: EOM are normal. Pupils are equal, round, and reactive to light.  Neck: Normal range of motion. Neck supple.  Cardiovascular: Normal rate and regular rhythm.  Exam reveals no gallop and no friction rub.   No murmur heard. Pulmonary/Chest: Effort normal. He has rales.  Crackles in bilateral bases.  Abdominal: Soft. Bowel sounds are normal. There is no tenderness. There is no rebound and no guarding.  Musculoskeletal: Normal range of motion. He exhibits no edema or tenderness.  3+ bilateral pitting edema to lower  extremities.  Neurological: He is alert and oriented to person, place, and time.  Skin: Skin is warm and dry. No rash noted. No erythema.  Psychiatric: He has a normal mood and affect. His behavior is normal.  Nursing note and vitals reviewed.    ED Treatments / Results  Labs (all labs ordered are listed, but only abnormal results are displayed) Labs Reviewed  CBC WITH DIFFERENTIAL/PLATELET - Abnormal; Notable for the following:       Result Value  RBC 3.57 (*)    Hemoglobin 10.8 (*)    HCT 33.4 (*)    All other components within normal limits  COMPREHENSIVE METABOLIC PANEL - Abnormal; Notable for the following:    BUN 61 (*)    Creatinine, Ser 2.36 (*)    GFR calc non Af Amer 28 (*)    GFR calc Af Amer 33 (*)    All other components within normal limits  TROPONIN I - Abnormal; Notable for the following:    Troponin I 0.54 (*)    All other components within normal limits  BRAIN NATRIURETIC PEPTIDE - Abnormal; Notable for the following:    B Natriuretic Peptide 974.0 (*)    All other components within normal limits  BASIC METABOLIC PANEL - Abnormal; Notable for the following:    Glucose, Bld 129 (*)    BUN 61 (*)    Creatinine, Ser 2.50 (*)    Calcium 8.8 (*)    GFR calc non Af Amer 27 (*)    GFR calc Af Amer 31 (*)    All other components within normal limits  TROPONIN I - Abnormal; Notable for the following:    Troponin I 0.45 (*)    All other components within normal limits  TROPONIN I - Abnormal; Notable for the following:    Troponin I 0.45 (*)    All other components within normal limits  TROPONIN I - Abnormal; Notable for the following:    Troponin I 0.42 (*)    All other components within normal limits  GLUCOSE, CAPILLARY - Abnormal; Notable for the following:    Glucose-Capillary 104 (*)    All other components within normal limits  GLUCOSE, CAPILLARY - Abnormal; Notable for the following:    Glucose-Capillary 166 (*)    All other components within normal  limits  GLUCOSE, CAPILLARY - Abnormal; Notable for the following:    Glucose-Capillary 171 (*)    All other components within normal limits  BASIC METABOLIC PANEL - Abnormal; Notable for the following:    BUN 59 (*)    Creatinine, Ser 2.57 (*)    Calcium 8.8 (*)    GFR calc non Af Amer 26 (*)    GFR calc Af Amer 30 (*)    All other components within normal limits  GLUCOSE, CAPILLARY - Abnormal; Notable for the following:    Glucose-Capillary 202 (*)    All other components within normal limits  GLUCOSE, CAPILLARY - Abnormal; Notable for the following:    Glucose-Capillary 61 (*)    All other components within normal limits  GLUCOSE, CAPILLARY - Abnormal; Notable for the following:    Glucose-Capillary 106 (*)    All other components within normal limits  GLUCOSE, CAPILLARY - Abnormal; Notable for the following:    Glucose-Capillary 118 (*)    All other components within normal limits  GLUCOSE, CAPILLARY - Abnormal; Notable for the following:    Glucose-Capillary 148 (*)    All other components within normal limits  BASIC METABOLIC PANEL - Abnormal; Notable for the following:    Glucose, Bld 137 (*)    BUN 63 (*)    Creatinine, Ser 2.68 (*)    Calcium 8.7 (*)    GFR calc non Af Amer 24 (*)    GFR calc Af Amer 28 (*)    All other components within normal limits  GLUCOSE, CAPILLARY - Abnormal; Notable for the following:    Glucose-Capillary 212 (*)    All other  components within normal limits  GLUCOSE, CAPILLARY - Abnormal; Notable for the following:    Glucose-Capillary 108 (*)    All other components within normal limits  GLUCOSE, CAPILLARY - Abnormal; Notable for the following:    Glucose-Capillary 207 (*)    All other components within normal limits  GLUCOSE, CAPILLARY - Abnormal; Notable for the following:    Glucose-Capillary 119 (*)    All other components within normal limits  BASIC METABOLIC PANEL - Abnormal; Notable for the following:    Glucose, Bld 110 (*)     BUN 58 (*)    Creatinine, Ser 2.47 (*)    Calcium 8.6 (*)    GFR calc non Af Amer 27 (*)    GFR calc Af Amer 31 (*)    All other components within normal limits  CBC - Abnormal; Notable for the following:    RBC 3.42 (*)    Hemoglobin 10.4 (*)    HCT 32.1 (*)    All other components within normal limits  GLUCOSE, CAPILLARY - Abnormal; Notable for the following:    Glucose-Capillary 159 (*)    All other components within normal limits  GLUCOSE, CAPILLARY - Abnormal; Notable for the following:    Glucose-Capillary 125 (*)    All other components within normal limits  GLUCOSE, CAPILLARY  GLUCOSE, CAPILLARY    EKG  EKG Interpretation  Date/Time:  Saturday November 10 2016 17:40:44 EDT Ventricular Rate:  72 PR Interval:  186 QRS Duration: 96 QT Interval:  388 QTC Calculation: 424 R Axis:   -10 Text Interpretation:  Normal sinus rhythm Incomplete right bundle branch block Inferior infarct , age undetermined Anteroseptal infarct , age undetermined T wave abnormality, consider lateral ischemia Abnormal ECG Confirmed by Lita Mains  MD, Lacrecia Delval ([id number]) on 11/10/2016 7:43:34 PM       Radiology Nm Myocar Multi W/spect W/wall Motion / Ef  Result Date: 11/13/2016  No diagnostic ST segment changes to indicate ischemia.  Large, severe intensity, fixed defect involving the mid to apical anterior, anteroseptal, septal, and inferoseptal myocardium most consistent with large infarct scar. There is a trivial region of partial reversibility toward the apex suggesting minor peri-infarct ischemia.  This is a high risk study based on perfusion defect size.  Nuclear stress EF: 45%.     Procedures Procedures (including critical care time)  Medications Ordered in ED Medications  furosemide (LASIX) injection 80 mg (80 mg Intravenous Given 11/10/16 1910)  technetium tetrofosmin (TC-MYOVIEW) injection 10 millicurie (11 millicuries Intravenous Contrast Given 11/13/16 0710)  technetium tetrofosmin  (TC-MYOVIEW) injection 30 millicurie (30 millicuries Intravenous Contrast Given 11/13/16 0830)  regadenoson (LEXISCAN) 0.4 MG/5ML injection SOLN (0.4 mg Intravenous Given 11/13/16 0829)  sodium chloride flush (NS) 0.9 % injection (10 mLs Intravenous Given 11/13/16 0829)     Initial Impression / Assessment and Plan / ED Course  I have reviewed the triage vital signs and the nursing notes.  Pertinent labs & imaging results that were available during my care of the patient were reviewed by me and considered in my medical decision making (see chart for details).    Patient with elevation in troponin. This may be trending down from earlier non-STEMI. Discussed with cardiology on-call. Recommend admission for trending troponin and diaphoresis. Hospitalist will see patient in the emergency department and admit.   Final Clinical Impressions(s) / ED Diagnoses   Final diagnoses:  Acute on chronic systolic congestive heart failure (HCC)  Elevated troponin    New Prescriptions Discharge Medication List  as of 11/14/2016  3:20 PM       Julianne Rice, MD 11/14/16 2125

## 2016-11-11 DIAGNOSIS — I5023 Acute on chronic systolic (congestive) heart failure: Secondary | ICD-10-CM

## 2016-11-11 DIAGNOSIS — E1121 Type 2 diabetes mellitus with diabetic nephropathy: Secondary | ICD-10-CM

## 2016-11-11 LAB — GLUCOSE, CAPILLARY
GLUCOSE-CAPILLARY: 171 mg/dL — AB (ref 65–99)
Glucose-Capillary: 104 mg/dL — ABNORMAL HIGH (ref 65–99)
Glucose-Capillary: 166 mg/dL — ABNORMAL HIGH (ref 65–99)
Glucose-Capillary: 202 mg/dL — ABNORMAL HIGH (ref 65–99)

## 2016-11-11 LAB — BASIC METABOLIC PANEL
Anion gap: 6 (ref 5–15)
BUN: 61 mg/dL — AB (ref 6–20)
CHLORIDE: 106 mmol/L (ref 101–111)
CO2: 28 mmol/L (ref 22–32)
CREATININE: 2.5 mg/dL — AB (ref 0.61–1.24)
Calcium: 8.8 mg/dL — ABNORMAL LOW (ref 8.9–10.3)
GFR calc Af Amer: 31 mL/min — ABNORMAL LOW (ref >60)
GFR calc non Af Amer: 27 mL/min — ABNORMAL LOW (ref >60)
Glucose, Bld: 129 mg/dL — ABNORMAL HIGH (ref 65–99)
Potassium: 4.3 mmol/L (ref 3.5–5.1)
SODIUM: 140 mmol/L (ref 135–145)

## 2016-11-11 LAB — TROPONIN I
Troponin I: 0.45 ng/mL (ref <0.03)
Troponin I: 0.45 ng/mL (ref <0.03)
Troponin I: 0.42 ng/mL (ref <0.03)

## 2016-11-11 NOTE — Progress Notes (Signed)
PROGRESS NOTE    Eugene [name]  TFT:[id number]  DOB: 1955-09-07  DOA: 11/10/2016 PCP: [name], MD Outpatient Specialists:  Brief Admission Hx:  Eugene [name] is a 61 y.o. male with medical history significant of recent NSTEMI at [facility] last week, CHF, pericardial effusion, COPD, DM, HTN just d/ced 3/11 from [facility] comes in with worsening le edema and DOE.  No chest pain.  No fevers.  No cough.  No pnd or orthopnea.  He reports his legs had hardly any swelling on 3/11 when he went home.  He takes his medications like he is suppose too, he watches his salt intake.  He reports he use to be on toresimide 20mg  daily up until about 3 weeks ago, ever since his pcp decreased his dose he has been having problems.  His dose was decreased because of a uti.  Pt has a trop over 3 8 days ago, today it is 0.5.  Pt referred for admission for chf exacerbation.  His oxgyen sats were 87% at rest in the ED.  He is not on home supplemental oxgyen.  Assessment & Plan:   1. Acute systolic CHF exacerbation with volume overload - likely exacerbated by recent decrease in diuretic dose.  He is being diuresed with IV furosemide now and likely will need several more days of IV diuresis.  He is tolerating diuresis with some minor improvement reported. He denies having chest pain. He recently had an MI and his troponins are trending down. Monitor weights, strict intake and output. Also careful to monitor renal function as he has chronic kidney disease and we will be monitoring BMP daily. He had a recent echocardiogram yesterday that showed improvement in the pericardial effusion. We will consult cardiology to see him tomorrow. 2. Type 2 diabetes mellitus-monitor with sliding scale coverage. Blood glucose testing ordered. 3. Essential hypertension-monitoring closely, currently well controlled. 4. Coronary artery disease status post recent N STEMI-currently stable and following  closely. 5. Pericardial effusion-improved per echo done 3/16. 6. Acute respiratory distress secondary to acute systolic heart failure exacerbation-improving with supportive therapy and treatment of the heart failure as noted above.  DVT prophylaxis: SCDs Code Status: full Family Communication: no one present Disposition Plan: TBD  Consultants:  Cardiology for 3/19  Subjective: Patient reports that he still short of breath but has noticed some improvement. He is diuresing.  Objective: Vitals:   11/10/16 2130 11/10/16 2207 11/11/16 0637 11/11/16 0955  BP: (!) 143/85 (!) 162/84 119/72 130/76  Pulse: 77 78 77 77  Resp: 17 20 20    Temp:  98.4 F (36.9 C) 98.2 F (36.8 C)   TempSrc:  Oral Oral   SpO2: 92% 95% 92%   Weight:  119.9 kg (264 lb 4.8 oz) 118.2 kg (260 lb 9.6 oz)   Height:        Intake/Output Summary (Last 24 hours) at 11/11/16 1308 Last data filed at 11/11/16 0901  Gross per 24 hour  Intake              240 ml  Output             1775 ml  Net            -1535 ml   Filed Weights   11/10/16 1737 11/10/16 2207 11/11/16 0637  Weight: 117.9 kg (260 lb) 119.9 kg (264 lb 4.8 oz) 118.2 kg (260 lb 9.6 oz)    Exam:  General exam: Awake alert, volume overloaded, in  no apparent distress Respiratory system: Diffuse crackles at bases. No increased work of breathing. Cardiovascular system: S1 & S2 heard  Gastrointestinal system: Abdomen is nondistended, soft and nontender. Normal bowel sounds heard. Central nervous system: Alert and oriented. No focal neurological deficits. Extremities: 2+ edema bilateral lower extremities.  Data Reviewed: Basic Metabolic Panel:  Recent Labs Lab 11/09/16 1140 11/10/16 1920 11/11/16 0448  NA 136 137 140  K 5.2* 4.8 4.3  CL 103 105 106  CO2 25 23 28   GLUCOSE 160* 80 129*  BUN 61* 61* 61*  CREATININE 2.45* 2.36* 2.50*  CALCIUM 9.4 9.4 8.8*   Liver Function Tests:  Recent Labs Lab 11/10/16 1920  AST 26  ALT 24  ALKPHOS  45  BILITOT 0.8  PROT 7.3  ALBUMIN 3.8   No results for input(s): LIPASE, AMYLASE in the last 168 hours. No results for input(s): AMMONIA in the last 168 hours. CBC:  Recent Labs Lab 11/10/16 1920  WBC 7.5  NEUTROABS 4.8  HGB 10.8*  HCT 33.4*  MCV 93.6  PLT 281   Cardiac Enzymes:  Recent Labs Lab 11/10/16 1920 11/11/16 0007 11/11/16 0448 11/11/16 1016  TROPONINI 0.54* 0.45* 0.45* 0.42*   CBG (last 3)   Recent Labs  11/10/16 2236 11/11/16 0807 11/11/16 1108  GLUCAP 77 104* 166*   No results found for this or any previous visit (from the past 240 hour(s)).   Studies: Dg Chest 2 View  Result Date: 11/10/2016 CLINICAL DATA:  Short of breath, congestive heart failure. EXAM: CHEST  2 VIEW COMPARISON:  11/02/2016 FINDINGS: Normal cardiac silhouette. There is bibasilar linear nodular airspace opacities slightly improved from comparison exam. Small effusions noted. Degenerative osteophytosis of the spine. IMPRESSION: Mild improvement in interstitial and pulmonary edema pattern. Electronically Signed   By: Suzy Bouchard M.D.   On: 11/10/2016 18:05     Scheduled Meds: . aspirin  81 mg Oral QHS  . clopidogrel  75 mg Oral QHS  . fenofibrate  160 mg Oral QHS  . furosemide  40 mg Intravenous Q12H  . hydrALAZINE  50 mg Oral BID  . insulin aspart  0-9 Units Subcutaneous TID WC  . insulin detemir  60 Units Subcutaneous QHS  . isosorbide mononitrate  30 mg Oral Daily  . metoprolol tartrate  50 mg Oral BID WC  . sodium chloride flush  3 mL Intravenous Q12H   Continuous Infusions:  Principal Problem:   Acute systolic CHF (congestive heart failure), NYHA class 3 (HCC) Active Problems:   DM type 2 (diabetes mellitus, type 2) ([place])   Essential hypertension   CAD- MI/RCA PCI-DES x2 2012, and RCA DES Jan 2014   Noncompliance   Pericardial effusion   SOB (shortness of breath)   Chronic renal insufficiency, stage III (moderate)   Elevated troponin   Acute on chronic  congestive heart failure ([place])  Time spent:   Irwin Brakeman, MD, FAAFP [facility] Pager [phone]  If 7PM-7AM, please contact night-coverage [email or web address] Password [email or web address] 11/11/2016, 1:08 PM    LOS: 1 day

## 2016-11-12 ENCOUNTER — Encounter (HOSPITAL_COMMUNITY): Payer: Self-pay | Admitting: Physician Assistant

## 2016-11-12 DIAGNOSIS — R0602 Shortness of breath: Secondary | ICD-10-CM

## 2016-11-12 DIAGNOSIS — R748 Abnormal levels of other serum enzymes: Secondary | ICD-10-CM

## 2016-11-12 DIAGNOSIS — I25119 Atherosclerotic heart disease of native coronary artery with unspecified angina pectoris: Secondary | ICD-10-CM

## 2016-11-12 DIAGNOSIS — I5033 Acute on chronic diastolic (congestive) heart failure: Secondary | ICD-10-CM

## 2016-11-12 DIAGNOSIS — I313 Pericardial effusion (noninflammatory): Secondary | ICD-10-CM

## 2016-11-12 DIAGNOSIS — Z9119 Patient's noncompliance with other medical treatment and regimen: Secondary | ICD-10-CM

## 2016-11-12 LAB — GLUCOSE, CAPILLARY
GLUCOSE-CAPILLARY: 106 mg/dL — AB (ref 65–99)
Glucose-Capillary: 61 mg/dL — ABNORMAL LOW (ref 65–99)
Glucose-Capillary: 118 mg/dL — ABNORMAL HIGH (ref 65–99)
Glucose-Capillary: 148 mg/dL — ABNORMAL HIGH (ref 65–99)
Glucose-Capillary: 212 mg/dL — ABNORMAL HIGH (ref 65–99)

## 2016-11-12 LAB — BASIC METABOLIC PANEL
Anion gap: 7 (ref 5–15)
BUN: 59 mg/dL — ABNORMAL HIGH (ref 6–20)
CALCIUM: 8.8 mg/dL — AB (ref 8.9–10.3)
CO2: 29 mmol/L (ref 22–32)
CREATININE: 2.57 mg/dL — AB (ref 0.61–1.24)
Chloride: 105 mmol/L (ref 101–111)
GFR calc Af Amer: 30 mL/min — ABNORMAL LOW (ref >60)
GFR calc non Af Amer: 26 mL/min — ABNORMAL LOW (ref >60)
Glucose, Bld: 80 mg/dL (ref 65–99)
Potassium: 4.4 mmol/L (ref 3.5–5.1)
Sodium: 141 mmol/L (ref 135–145)

## 2016-11-12 MED ORDER — INSULIN DETEMIR 100 UNIT/ML ~~LOC~~ SOLN
45.0000 [IU] | Freq: Every day | SUBCUTANEOUS | Status: DC
  Filled 2016-11-12: qty 0.45

## 2016-11-12 MED ORDER — HYDRALAZINE HCL 25 MG PO TABS
25.0000 mg | ORAL_TABLET | Freq: Two times a day (BID) | ORAL | Status: DC
  Administered 2016-11-12 – 2016-11-13 (×2): 25 mg via ORAL
  Filled 2016-11-12: qty 1

## 2016-11-12 MED ORDER — INSULIN DETEMIR 100 UNIT/ML ~~LOC~~ SOLN
30.0000 [IU] | Freq: Every day | SUBCUTANEOUS | Status: DC
  Administered 2016-11-12 – 2016-11-13 (×2): 30 [IU] via SUBCUTANEOUS
  Filled 2016-11-12 (×3): qty 0.3

## 2016-11-12 MED ORDER — INSULIN ASPART 100 UNIT/ML ~~LOC~~ SOLN
6.0000 [IU] | Freq: Three times a day (TID) | SUBCUTANEOUS | Status: DC
  Administered 2016-11-13 – 2016-11-14 (×3): 6 [IU] via SUBCUTANEOUS

## 2016-11-12 NOTE — Progress Notes (Signed)
PROGRESS NOTE   Eugene [name]  MLY:[id number]  DOB: 05/24/1956  DOA: 11/10/2016 PCP: [name], MD Outpatient Specialists:  Brief Admission Hx:  Eugene [name] is a 61 y.o. male with medical history significant of recent NSTEMI [facility] last week, CHF, pericardial effusion, COPD, DM, HTN just d/ced 3/11 from [facility] service comes in with worsening le edema and DOE.  No chest pain.  No fevers.  No cough.  No pnd or orthopnea.  He reports his legs had hardly any swelling on 3/11 when he went home.  He takes his medications like he is suppose too, he watches his salt intake.  He reports he use to be on toresimide 20mg  daily up until about 3 weeks ago, ever since his pcp decreased his dose he has been having problems.  His dose was decreased because of a uti.  Pt has a trop over 3 8 days ago, today it is 0.5.  Pt referred for admission for chf exacerbation.  His oxgyen sats were 87% at rest in the ED.  He is not on home supplemental oxgyen.  Assessment & Plan:   1. Acute systolic CHF exacerbation with volume overload - likely exacerbated by recent decrease in diuretic dose.  He is being diuresed with IV furosemide now and likely will need several more days of IV diuresis.  He is tolerating diuresis with some minor improvement reported. He denies having chest pain. He recently had an MI and his troponins are trending down. Monitor weights, strict intake and output. Also careful to monitor renal function as he has chronic kidney disease and we will be monitoring BMP daily. He had a recent echocardiogram 3/16 that showed improvement in the pericardial effusion. Consult cardiology.   2. Dyspnea - Pt has increased dyspnea with ambulation, will obtain ambulatory pulse oximetry study, likely could need home oxygen support.  3. Type 2 diabetes mellitus-monitor with sliding scale coverage. Blood glucose testing ordered. Reduced basal insulin 3/19 because of some lower blood glucose  readings. 4. Essential hypertension-monitoring closely, currently well controlled. 5. Coronary artery disease status post recent N STEMI-currently stable and following closely. 6. Pericardial effusion-improved per echo done 3/16. 7. Acute respiratory distress secondary to acute systolic heart failure exacerbation-improving with supportive therapy and treatment of the heart failure as noted above.  DVT prophylaxis: SCDs Code Status: full Family Communication: no one present Disposition Plan: home when medically stable  Consultants:  Cardiology for 3/19  Subjective: Patient reports that he still short of breath with minimal ambulation to bathroom but feels much better when wearing oxygen.   Objective: Vitals:   11/11/16 0955 11/11/16 1411 11/11/16 2048 11/12/16 0537  BP: 130/76 110/66 108/67 112/61  Pulse: 77 60 67 61  Resp:  18 18 18   Temp:  98.4 F (36.9 C) 98.6 F (37 C) 97.7 F (36.5 C)  TempSrc:  Oral Oral Oral  SpO2:  96% 94% 96%  Weight:    118.5 kg (261 lb 3.2 oz)  Height:        Intake/Output Summary (Last 24 hours) at 11/12/16 0933 Last data filed at 11/12/16 0538  Gross per 24 hour  Intake              480 ml  Output             1100 ml  Net             -620 ml   Filed Weights   11/10/16 2207 11/11/16  0637 11/12/16 0537  Weight: 119.9 kg (264 lb 4.8 oz) 118.2 kg (260 lb 9.6 oz) 118.5 kg (261 lb 3.2 oz)    Exam:  General exam: Awake alert, volume overloaded, in no apparent distress Respiratory system: Diffuse crackles at bases. No increased work of breathing. Cardiovascular system: S1 & S2 heard  Gastrointestinal system: Abdomen is nondistended, soft and nontender. Normal bowel sounds heard. Central nervous system: Alert and oriented. No focal neurological deficits. Extremities: 2+ edema bilateral lower extremities.  Data Reviewed: Basic Metabolic Panel:  Recent Labs Lab 11/09/16 1140 11/10/16 1920 11/11/16 0448 11/12/16 0539  NA 136 137 140 141   K 5.2* 4.8 4.3 4.4  CL 103 105 106 105  CO2 25 23 28 29   GLUCOSE 160* 80 129* 80  BUN 61* 61* 61* 59*  CREATININE 2.45* 2.36* 2.50* 2.57*  CALCIUM 9.4 9.4 8.8* 8.8*   Liver Function Tests:  Recent Labs Lab 11/10/16 1920  AST 26  ALT 24  ALKPHOS 45  BILITOT 0.8  PROT 7.3  ALBUMIN 3.8   No results for input(s): LIPASE, AMYLASE in the last 168 hours. No results for input(s): AMMONIA in the last 168 hours. CBC:  Recent Labs Lab 11/10/16 1920  WBC 7.5  NEUTROABS 4.8  HGB 10.8*  HCT 33.4*  MCV 93.6  PLT 281   Cardiac Enzymes:  Recent Labs Lab 11/10/16 1920 11/11/16 0007 11/11/16 0448 11/11/16 1016  TROPONINI 0.54* 0.45* 0.45* 0.42*   CBG (last 3)   Recent Labs  11/11/16 2047 11/12/16 0731 11/12/16 0819  GLUCAP 202* 61* 106*   No results found for this or any previous visit (from the past 240 hour(s)).   Studies: Dg Chest 2 View  Result Date: 11/10/2016 CLINICAL DATA:  Short of breath, congestive heart failure. EXAM: CHEST  2 VIEW COMPARISON:  11/02/2016 FINDINGS: Normal cardiac silhouette. There is bibasilar linear nodular airspace opacities slightly improved from comparison exam. Small effusions noted. Degenerative osteophytosis of the spine. IMPRESSION: Mild improvement in interstitial and pulmonary edema pattern. Electronically Signed   By: Suzy Bouchard M.D.   On: 11/10/2016 18:05   Scheduled Meds: . aspirin  81 mg Oral QHS  . clopidogrel  75 mg Oral QHS  . fenofibrate  160 mg Oral QHS  . furosemide  40 mg Intravenous Q12H  . hydrALAZINE  50 mg Oral BID  . insulin aspart  0-9 Units Subcutaneous TID WC  . insulin detemir  45 Units Subcutaneous QHS  . isosorbide mononitrate  30 mg Oral Daily  . metoprolol tartrate  50 mg Oral BID WC  . sodium chloride flush  3 mL Intravenous Q12H   Continuous Infusions:  Principal Problem:   Acute systolic CHF (congestive heart failure), NYHA class 3 (HCC) Active Problems:   DM type 2 (diabetes mellitus,  type 2) ([place])   Essential hypertension   CAD- MI/RCA PCI-DES x2 2012, and RCA DES Jan 2014   Noncompliance   Pericardial effusion   SOB (shortness of breath)   Chronic renal insufficiency, stage III (moderate)   Elevated troponin   Acute on chronic congestive heart failure ([place])  Time spent:   Irwin Brakeman, MD, FAAFP [facility] Pager [phone]  If 7PM-7AM, please contact night-coverage [email or web address] Password [email or web address] 11/12/2016, 9:33 AM    LOS: 2 days

## 2016-11-12 NOTE — Consult Note (Signed)
Cardiology Consultation Note    Patient ID: Eugene [name], MRN: [id number], DOB/AGE: [birth year] 61 y.o. Admit date: 11/10/2016   Date of Consult: 11/12/2016 Primary Physician: [name], MD Primary Cardiologist: Dr. Bronson Ing Primary Nephrologist: Does Not Have One  Chief Complaint: Shortness of Breath Reason for Consultation: Shortness of Breath, Swelling, Recent Troponin of 3 and Admission for CHF Requesting MD: Dr. Wynetta Emery  HPI: Eugene [name] is a 61 y.o. male with history of CAD (DES to PDA/RCA in 2012, STEMI in 2014 with DES to [place]), chronic diastolic CHF, HTN, HLD (intolerant of statins, previously unable to fill Zetia 2/2 financial restrictions), CKD stage III (baseline 2), DM, OSA, GERD, COPD, chronic-appearing anemia and chronic pericardial effusion (s/p pericardial window in 2014 with no malignant cells by path) who presented to [facility] 11/10/16 with worsening SOB, edema, and hypoxia following recent admission at [facility]. He was recently seen by his PCP earlier this month and torsemide dose was decreased due to UTI and worsening renal function. He was subsequently admitted 3/9-3/11/18 with NSTEMI peak troponin of of 3.03 and acute on chronic combined CHF with echo 11/03/16 showing EF of 45-50% with hypokinesis of the mid-apical inferior and inferoseptal myocardium, grade 2 DD, mild LAE, large pericardial effusion posteriorly with smaller collection anteriorly (similar size in 10/2015). He was diuresed with upward trend in Cr to 2.71. D/c weight 259lb. With his elevated troponin enzymes, an outpatient stress test was recommended. He subsequently had OP echo 11/09/16 showing EF 50-55%, hypokinesis of the mid anterior and basal-mid anteroseptal myocardium, moderate sized pericardial effusion without evidence for tamponade, mildly improved compared to 3/10 study, and f/u Cr was 2.45. Note prior echo 2017 did not show any WMA. (Of note, he was also admitted 05/2016 for chest pain and mildly  elevated troponin at which time cath was planned but later deferred due to relatively flat trend.)  He felt much improved upon discharge on 11/04/16. However, over the weekend on Friday 3/16 he admits to eating a small meal of Chinese food and subsequent to that developed worsening edema and dyspnea on exertion. He denies any chest pain. He does recall his DOE feels similar to when he required PCI in the past. He has chronic orthopnea, unchanged. No recent bleeding, palpitations, syncope. He was admitted for treatment of a/c CHF and hypoxia with O2 sats 87% RA. He reports feeling significantly better anytime oxygen is applied. He's been started on IV Lasix 40mg  q12hr.  Labs this admission: Cr 2.36->2.5->2.57, troponin 0.54->0.45->0.45, BNP 974, Hgb 10.8. Readmission weight was 264lb, down to 261 today with -2.1L. He is feeling much better but still has some LEE (L>R which is chronic) and DOE. IM is planning on doing trial of walk to assess O2 requirements.  Past Medical History:  Diagnosis Date  . Anemia   . Anxiety   . Arteriosclerotic cardiovascular disease (ASCVD)    a. 05/2011 s/p DES to PDA. b. 08/2012 Inflat  STEMI/Cath/PCI: LM minor irregs, LAD 50p, D1 50, LCX nl, OM1 25, RCA 30-40p, 100d (treated with 2.75x59mm Promus Premier DES);    [name] Asthma   . Bell palsy   . C. difficile colitis    a. 08/2012  . Cholelithiasis 07/2012   Asymptomatic; identified incidentally  . Chronic diastolic CHF (congestive heart failure) ([place])   . Contrast dye induced nephropathy    a. 08/2012 post cath/pci  . COPD (chronic obstructive pulmonary disease) ([place])   . Diabetes mellitus    Peripheral neuropathy  .  Gallstones   . GERD (gastroesophageal reflux disease)   . Hyperlipidemia   . Hypertension   . Myocardial infarct 09/08/12  . Nephrolithiasis   . Old myocardial infarction   . Pericardial effusion    a. s/p window in 2014.  [name] PONV (postoperative nausea and vomiting)   . Sleep apnea       Surgical  History:  Past Surgical History:  Procedure Laterality Date  . CARDIAC CATHETERIZATION    . CHEST TUBE INSERTION    . CIRCUMCISION    . COLONOSCOPY     In [facility], approximately 2011 per patient, was normal. Advised to come back in 10 years.  . ESOPHAGOGASTRODUODENOSCOPY     in [place] over 20 yrs ago  . ESOPHAGOGASTRODUODENOSCOPY  06/12/2012   RWE:RXVQMG esophagus-status post Venia Minks dilation. Abnormal gastric mucosa of uncertain significance-status post biopsy  . INTRAOPERATIVE TRANSESOPHAGEAL ECHOCARDIOGRAM N/A 06/23/2013   Procedure: INTRAOPERATIVE TRANSESOPHAGEAL ECHOCARDIOGRAM;  Surgeon: Grace Isaac, MD;  Location: [facility];  Service: Open Heart Surgery;  Laterality: N/A;  . LEFT HEART CATHETERIZATION WITH CORONARY ANGIOGRAM N/A 09/08/2012   Procedure: LEFT HEART CATHETERIZATION WITH CORONARY ANGIOGRAM;  Surgeon: [name], MD;  Location: [facility] CATH LAB;  Service: Cardiovascular;  Laterality: N/A;  . stents    . SUBXYPHOID PERICARDIAL WINDOW N/A 06/23/2013   Procedure: SUBXYPHOID PERICARDIAL WINDOW;  Surgeon: Grace Isaac, MD;  Location: [facility] OR;  Service: Thoracic;  Laterality: N/A;     Home Meds: Prior to Admission medications   Medication Sig Start Date End Date Taking? Authorizing Provider  acetaminophen (TYLENOL) 500 MG tablet Take 1,000 mg by mouth every 6 (six) hours as needed for mild pain, moderate pain, fever or headache.    Yes Historical Provider, MD  allopurinol (ZYLOPRIM) 100 MG tablet Take 100 mg by mouth daily as needed (for gout flares).    Yes Historical Provider, MD  ALPRAZolam (XANAX) 0.5 MG tablet TAKE 1/2-1 TABLET BY MOUTH AT BEDTIME AS NEEDED Patient taking differently: TAKE 1/2-1 TABLET BY MOUTH AT BEDTIME AS NEEDED FOR SLEEP 10/01/16  Yes [name], MD  aspirin 81 MG chewable tablet Chew 81 mg by mouth at bedtime.   Yes Historical Provider, MD  carboxymethylcellulose (REFRESH PLUS) 0.5 % SOLN Place 1 drop into both eyes 3 (three)  times daily as needed.   Yes Historical Provider, MD  clopidogrel (PLAVIX) 75 MG tablet TAKE 1 TABLET AT BEDTIME 09/07/16  Yes [name], MD  colchicine 0.6 MG tablet Take 0.6 mg by mouth 2 (two) times daily as needed (for gout flares).    Yes Historical Provider, MD  fenofibrate 160 MG tablet TAKE 1 TABLET AT BEDTIME 04/09/16  Yes [name], MD  glimepiride (AMARYL) 1 MG tablet Take twice a day with meals Patient taking differently: Take 1 mg by mouth 2 (two) times daily with a meal.  10/22/16  Yes [name], MD  hydrALAZINE (APRESOLINE) 50 MG tablet Take 1 tablet (50 mg total) by mouth 3 (three) times daily. Patient taking differently: Take 50 mg by mouth 2 (two) times daily.  11/04/16  Yes Erma Heritage, PA  Insulin Detemir (LEVEMIR FLEXPEN) 100 UNIT/ML Pen Inject 80 Units into the skin daily at 10 pm. Patient taking differently: Inject 60 Units into the skin at bedtime.  09/03/16  Yes [name], MD  isosorbide mononitrate (IMDUR) 30 MG 24 hr tablet Take 1 tablet (30 mg total) by mouth daily. 06/16/16  Yes [place] M  Strader, PA  metoprolol tartrate (LOPRESSOR) 25 MG tablet Take 50 mg by mouth 2 (two) times daily with a meal.   Yes Historical Provider, MD  nitroGLYCERIN (NITROSTAT) 0.4 MG SL tablet Place 1 tablet (0.4 mg total) under the tongue every 5 (five) minutes x 3 doses as needed for chest pain. 06/15/16  Yes Erma Heritage, PA  torsemide (DEMADEX) 20 MG tablet Take 0.5 tablets (10mg ) on 3/12 then 1 tablet (20mg ) on 11/06/2016. Alternate daily dosing between 10mg  and 20mg . Patient taking differently: Take 10-20 mg by mouth See admin instructions. Pt alternates his dose daily between 10mg  and 20mg . 11/04/16  Yes Erma Heritage, PA  traMADol (ULTRAM) 50 MG tablet TAKE 1 TABLET BY MOUTH EVERY 6 HOURS AS NEEDED FOR PAIN. 10/10/16  Yes [name], MD    Inpatient Medications:  . aspirin  81 mg Oral QHS  . clopidogrel  75 mg Oral QHS  . fenofibrate  160 mg Oral  QHS  . furosemide  40 mg Intravenous Q12H  . hydrALAZINE  50 mg Oral BID  . insulin aspart  0-9 Units Subcutaneous TID WC  . insulin detemir  45 Units Subcutaneous QHS  . isosorbide mononitrate  30 mg Oral Daily  . metoprolol tartrate  50 mg Oral BID WC  . sodium chloride flush  3 mL Intravenous Q12H     Allergies:  Allergies  Allergen Reactions  . Hydrocodone Nausea And Vomiting  . Lisinopril Cough  . Neurontin [Gabapentin] Other (See Comments)    Reaction:  Suicidal thoughts   . Statins Other (See Comments)    Reaction:  Muscle pain   . Metformin And Related Diarrhea  . Norvasc [Amlodipine Besylate] Swelling and Other (See Comments)    Reaction:  Pedal edema    Social History   Social History  . Marital status: Married    Spouse name: N/A  . Number of children: 2  . Years of education: N/A   Occupational History  . Shipping [employer]   Social History Main Topics  . Smoking status: Never Smoker  . Smokeless tobacco: Never Used  . Alcohol use No     Comment: heavy etoh use 30 years ago  . Drug use: No  . Sexual activity: Yes    Birth control/ protection: None   Other Topics Concern  . 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 Disabled at this point.     Family History  Problem Relation Age of Onset  . Diabetes Mother   . Heart attack Mother   . Stroke Mother   . Diabetes Sister   . Sleep apnea Sister   . Hypertension Brother   . Diabetes Brother   . Diabetes Brother   . Hypertension Brother   . Colon cancer Neg Hx   . Liver disease Neg Hx      Review of Systems: No chills, fever, syncope. All other systems reviewed and are otherwise negative except as noted above.  Labs:  Recent Labs  11/10/16 1920 11/11/16 0007 11/11/16 0448 11/11/16 1016  TROPONINI 0.54* 0.45* 0.45* 0.42*   Lab Results  Component Value Date   WBC 7.5 11/10/2016   HGB 10.8 (L) 11/10/2016     HCT 33.4 (L) 11/10/2016   MCV 93.6 11/10/2016   PLT 281 11/10/2016    Recent Labs Lab 11/10/16 1920  11/12/16 0539  NA 137  < >  141  K 4.8  < > 4.4  CL 105  < > 105  CO2 23  < > 29  BUN 61*  < > 59*  CREATININE 2.36*  < > 2.57*  CALCIUM 9.4  < > 8.8*  PROT 7.3  --   --   BILITOT 0.8  --   --   ALKPHOS 45  --   --   ALT 24  --   --   AST 26  --   --   GLUCOSE 80  < > 80  < > = values in this interval not displayed. Lab Results  Component Value Date   CHOL 204 (H) 10/16/2016   HDL 31 (L) 10/16/2016   LDLCALC 118 (H) 10/16/2016   TRIG 274 (H) 10/16/2016     Radiology/Studies:  Dg Chest 2 View  Result Date: 11/10/2016 CLINICAL DATA:  Short of breath, congestive heart failure. EXAM: CHEST  2 VIEW COMPARISON:  11/02/2016 FINDINGS: Normal cardiac silhouette. There is bibasilar linear nodular airspace opacities slightly improved from comparison exam. Small effusions noted. Degenerative osteophytosis of the spine. IMPRESSION: Mild improvement in interstitial and pulmonary edema pattern. Electronically Signed   By: Suzy Bouchard M.D.   On: 11/10/2016 18:05   Dg Chest 2 View  Result Date: 11/02/2016 CLINICAL DATA:  Shortness of breath. EXAM: CHEST  2 VIEW COMPARISON:  Two-view chest x-ray 11/09/2015. FINDINGS: The heart is enlarged. There is a mild diffuse interstitial pattern compatible with edema. Small effusions are present. Mild dependent atelectasis is present bilaterally without other focal airspace disease. The visualized soft tissues and bony thorax are unremarkable. IMPRESSION: 1. Cardiomegaly with mild edema and small effusions compatible with congestive heart failure. 2. No significant focal airspace disease. Electronically Signed   By: [place] M.D.   On: 11/02/2016 16:27    Wt Readings from Last 3 Encounters:  11/12/16 261 lb 3.2 oz (118.5 kg)  11/04/16 259 lb 11.2 oz (117.8 kg)  10/22/16 259 lb (117.5 kg)    EKG: NSR, IRBBB, prior inferior and  anteroseptal infarct, TWI I and avL  Physical Exam Blood pressure 112/61, pulse 61, temperature 97.7 F (36.5 C), temperature source Oral, resp. rate 18, height 6\' 1"  (1.854 m), weight 261 lb 3.2 oz (118.5 kg), SpO2 96 %. Body mass index is 34.46 kg/m. General: Well developed, well nourished obese WM, in no acute distress. Head: Normocephalic, atraumatic, sclera non-icteric, no xanthomas, nares are without discharge.  Neck: Negative for carotid bruits. JVD not elevated. Lungs: Clear bilaterally to auscultation without wheezes, rales, or rhonchi. Breathing is unlabored. Heart: RRR with S1 S2. No murmurs, rubs, or gallops appreciated. Abdomen: Soft, non-tender, rounded with normoactive bowel sounds. No hepatomegaly. No rebound/guarding. No obvious abdominal masses. Msk:  Strength and tone appear normal for age. Extremities: No clubbing or cyanosis. 1+ BLE edema L slightly worse than R which patient states is chronic, most impressive in pedal region. Distal pedal pulses are 2+ and equal bilaterally. Neuro: Alert and oriented X 3. No facial asymmetry. No focal deficit. Moves all extremities spontaneously. Psych:  Responds to questions appropriately with a normal affect.     Assessment and Plan  Eugene [name] with CAD (DES to PDA/RCA in 2012, STEMI in 2014 with DES to [place] with mild-moderate residual disease otherwise), chronic diastolic CHF, HTN, HLD (intolerant of statins, previously unable to fill Zetia 2/2 financial restrictions), CKD stage III (baseline 2), DM, OSA, GERD, COPD, chronic-appearing anemia and chronic pericardial effusion (s/p pericardial window in  2014 with no malignant cells by path) who presented back to [facility] with recurrent dyspnea, edema, acute on chronic diastolic CHF and hypoxia. He was recently admitted to [facility] 3/9-3/11/18 with a/c combined CHF (new EF 45-50% with WMA and large pericardial effusion), troponin of 3, and AKI on CKD stage III -> f/u echo 11/09/16 showed EF 50-55% with  moderate pericardial effusion slightly improved from prior.  1. Acute on chronic diastolic CHF - recent DC weight 259 which is he is now back closer to at 261lb. Still with some edema on board. Will review diuretic regimen with MD. Reviewed importance of low sodium diet with patient (recent [place]), also reviewed importance of daily weights.  2. CAD with recent elevated troponin of 3 - question whether there could be underlying ischemia driving the above recent picture of readmissions. Additionally, 2D echocardiogram recently showed new WMA. In ideal situation would proceed directly to cath but there would be risk of CIN - per review with MD, would recommend proceeding with inpatient nuc to assess for physiologic ischemia. He ate today so this would be tomorrow. Suspect this admission's troponin is downtrending from prior.  3. AKI on CKD stage III - avoid nephrotoxic agents. Would benefit from establishing with renal. Follow with diuresis. Prior baseline was ~2 but recent levels have been 2.3-2.7.  4. HTN - BP intermittently soft. Will review regimen with MD, question whether to hold hydralazine to allow higher BPs for renal perfusion and/or titration of anti-anginal therapy.  5. Hyperlipidemia - intolerant of statins, previously unable to fill Zetia 2/2 financial restrictions. Would benefit from referral to lipid clinic to consider [facility].  6. Pericardial effusion - follow clinically. Moderate by repeat echo 2016. Does not appear a clear cause has been identified previously.  Signed, Charlie Pitter PA-C 11/12/2016, 8:46 AM Pager: [phone]   Attending note:  Patient seen and examined. Reviewed the chart and discussed the case with Ms. Idolina Primer PA-C. Eugene [name] presents with worsening dyspnea on exertion and fatigue. He was just recently hospitalized at [facility], discharged on March 11 after treatment for acute on chronic diastolic heart failure and also elevated troponin up to 3.30 in the  setting of known ischemic heart disease with prior STEMI in 2014 and DES to the distal RCA. He had moderate left system disease at that time that was managed medically. Plan was to consider an outpatient Myoview after discharge. He was to see Dr. Bronson Ing on March 21. Recent echocardiogram on March 16 showed LVEF 50-55% with hypokinesis of the mid anterior and mid to basal anteroseptal walls, moderate sized pericardial effusion which was decreasing compared to prior studies.  On examination this morning he states that he feels better but still not at baseline. He is afebrile, systolic blood pressure in the 100-110 range. Obese male in no distress. Lungs exhibit decreased breath sounds without crackles, cardiac exam with RRR no gallop. Lab work shows creatinine 2.57, potassium 4.4, troponin I 0.4-0.5 range, hemoglobin 10.8. ECG shows sinus rhythm with R' in lead V1, old inferior infarct pattern, and poor R-wave progression rule out old anterior infarct pattern.  Patient presents with worsening dyspnea on exertion and fatigue, rule out anginal equivalent, although recently hospitalized for acute on chronic diastolic heart failure. His echocardiogram shows reduction in EF with wall motion abnormalities compared to previous testing. Troponin I levels may be coming down from recent peak. ECG is abnormal as discussed above. Plan is to proceed with an inpatient Lexiscan Myoview to assess ischemic  burden and give consideration to cardiac catheterization if significantly abnormal, although he is at risk for contrast-induced nephropathy with CKD stage 3 and creatinine 2.5 range. We will continue medical therapy, reduce dose of hydralazine for now. Further recommendations to follow.  Satira Sark, M.D., F.A.C.C.

## 2016-11-12 NOTE — Care Management Note (Signed)
Case Management Note  Patient Details  Name: Eugene [name] MRN: [id number] Date of Birth: 05-Jun-1956  Subjective/Objective:   Adm with CHF. Recent NSTEMI. From home, ind with ADL's. No HH or DME PTA. Currently on room air. Plans to return home with self care. Agreeable to EMMI transition calls prior to discharge.           Action/Plan: CM placed referral order for EMMI transition calls for heart failure.   Expected Discharge Date:       11/14/2016           Expected Discharge Plan:  Home/Self Care  In-House Referral:     Discharge planning Services  CM Consult  Post Acute Care Choice:  NA Choice offered to:  NA  DME Arranged:    DME Agency:     HH Arranged:    HH Agency:     Status of Service:  Completed, signed off  If discussed at [employer] of Stay Meetings, dates discussed:    Additional Comments:  Delno Blaisdell, Chauncey [place], RN 11/12/2016, 12:51 PM

## 2016-11-13 ENCOUNTER — Encounter (HOSPITAL_COMMUNITY): Payer: Self-pay | Admitting: Family Medicine

## 2016-11-13 ENCOUNTER — Inpatient Hospital Stay (HOSPITAL_BASED_OUTPATIENT_CLINIC_OR_DEPARTMENT_OTHER): Payer: Medicare HMO

## 2016-11-13 DIAGNOSIS — N189 Chronic kidney disease, unspecified: Secondary | ICD-10-CM

## 2016-11-13 DIAGNOSIS — I429 Cardiomyopathy, unspecified: Secondary | ICD-10-CM

## 2016-11-13 DIAGNOSIS — N179 Acute kidney failure, unspecified: Secondary | ICD-10-CM

## 2016-11-13 DIAGNOSIS — D649 Anemia, unspecified: Secondary | ICD-10-CM

## 2016-11-13 DIAGNOSIS — R0602 Shortness of breath: Secondary | ICD-10-CM | POA: Diagnosis not present

## 2016-11-13 DIAGNOSIS — I10 Essential (primary) hypertension: Secondary | ICD-10-CM

## 2016-11-13 DIAGNOSIS — E782 Mixed hyperlipidemia: Secondary | ICD-10-CM

## 2016-11-13 DIAGNOSIS — I5023 Acute on chronic systolic (congestive) heart failure: Secondary | ICD-10-CM

## 2016-11-13 LAB — BASIC METABOLIC PANEL
Anion gap: 8 (ref 5–15)
BUN: 63 mg/dL — AB (ref 6–20)
CALCIUM: 8.7 mg/dL — AB (ref 8.9–10.3)
CO2: 28 mmol/L (ref 22–32)
CREATININE: 2.68 mg/dL — AB (ref 0.61–1.24)
Chloride: 102 mmol/L (ref 101–111)
GFR calc non Af Amer: 24 mL/min — ABNORMAL LOW (ref >60)
GFR, EST AFRICAN AMERICAN: 28 mL/min — AB (ref >60)
GLUCOSE: 137 mg/dL — AB (ref 65–99)
Potassium: 4.2 mmol/L (ref 3.5–5.1)
Sodium: 138 mmol/L (ref 135–145)

## 2016-11-13 LAB — NM MYOCAR MULTI W/SPECT W/WALL MOTION / EF
CHL CUP NUCLEAR SDS: 3
LHR: 0.65
LV dias vol: 172 mL (ref 62–150)
LVSYSVOL: 95 mL
Peak HR: 74 {beats}/min
Rest HR: 65 {beats}/min
SRS: 12
SSS: 15
TID: 1.12

## 2016-11-13 LAB — GLUCOSE, CAPILLARY
GLUCOSE-CAPILLARY: 207 mg/dL — AB (ref 65–99)
GLUCOSE-CAPILLARY: 119 mg/dL — AB (ref 65–99)
GLUCOSE-CAPILLARY: 159 mg/dL — AB (ref 65–99)
Glucose-Capillary: 108 mg/dL — ABNORMAL HIGH (ref 65–99)

## 2016-11-13 MED ORDER — TECHNETIUM TC 99M TETROFOSMIN IV KIT
10.0000 | PACK | Freq: Once | INTRAVENOUS | Status: AC | PRN
  Administered 2016-11-13: 11 via INTRAVENOUS

## 2016-11-13 MED ORDER — REGADENOSON 0.4 MG/5ML IV SOLN
INTRAVENOUS | Status: AC
  Administered 2016-11-13: 0.4 mg via INTRAVENOUS
  Filled 2016-11-13: qty 5

## 2016-11-13 MED ORDER — SODIUM CHLORIDE 0.9% FLUSH
INTRAVENOUS | Status: AC
  Administered 2016-11-13: 10 mL via INTRAVENOUS
  Filled 2016-11-13: qty 10

## 2016-11-13 MED ORDER — TECHNETIUM TC 99M TETROFOSMIN IV KIT
30.0000 | PACK | Freq: Once | INTRAVENOUS | Status: AC | PRN
  Administered 2016-11-13: 30 via INTRAVENOUS

## 2016-11-13 NOTE — Progress Notes (Signed)
PROGRESS NOTE   Eugene [name]  UKG:[id number]  DOB: 1955/10/11  DOA: 11/10/2016 PCP: [name], MD Outpatient Specialists:  Brief Admission Hx:  Eugene [name] is a 61 y.o. male with medical history significant of recent NSTEMI at [facility] last week, CHF, pericardial effusion, COPD, DM, HTN just d/ced 3/11 from [facility] service comes in with worsening le edema and DOE.  No chest pain.  No fevers.  No cough.  No pnd or orthopnea.  He reports his legs had hardly any swelling on 3/11 when he went home.  He takes his medications like he is suppose too, he watches his salt intake.  He reports he use to be on toresimide 20mg  daily up until about 3 weeks ago, ever since his pcp decreased his dose he has been having problems.  His dose was decreased because of a uti.  Pt has a trop over 3 8 days ago, today it is 0.5.  Pt referred for admission for chf exacerbation.  His oxgyen sats were 87% at rest in the ED.  He is not on home supplemental oxgyen.  Assessment & Plan:   1. Acute systolic CHF exacerbation with volume overload - likely exacerbated by recent decrease in diuretic dose.  He was diuresed with IV furosemide now and much improved.  He had stress test this morning and tolerated well.  See report.  He has lost more fluid weight now closer to his baseline.   He denies having chest pain. He recently had an MI and his troponins are now trending down. Monitor weights, strict intake and output. Also careful to monitor renal function as he has chronic kidney disease and we will be monitoring BMP daily. He had a recent echocardiogram 3/16 that showed improvement in the pericardial effusion. Cardiology following and managing his cardiac medications.  Stress test reports large infarct scar in LAD distribution but no large ischemic burden - cardiology recommending to manage medically for now. 2. Dyspnea - Pt has increased dyspnea with ambulation, will obtain ambulatory pulse oximetry study  prior to discharge as he could need home oxygen support. He seems to do well on room air with no ambulation.  3. Type 2 diabetes mellitus-monitor with sliding scale coverage. Blood glucose testing ordered. Reduced basal insulin 3/19 because of some lower blood glucose readings.  Will follow closely.   4. Essential hypertension-monitoring closely, currently well controlled. 5. Coronary artery disease status post recent NSTEMI-see cardiology . 6. Pericardial effusion-improved per echo done 3/16. 7. Acute respiratory distress secondary to acute systolic heart failure exacerbation-resolved now.  He got better with supportive therapy and treatment of the heart failure as noted above.  DVT prophylaxis: SCDs Code Status: full Family Communication: no one present Disposition Plan: home when medically stable  Consultants:  Cardiology for 3/19  Subjective: Patient reports that he is feeling much better and happy with the weight loss   Objective: Vitals:   11/12/16 1406 11/12/16 2142 11/13/16 0641 11/13/16 0746  BP: 105/62 110/61 111/64   Pulse: 75 72 68   Resp: 18 18 16    Temp: 98 F (36.7 C) 97.9 F (36.6 C) 97.9 F (36.6 C)   TempSrc: Oral Oral Oral   SpO2: 97% 98% 97%   Weight:   118.5 kg (261 lb 2.9 oz) 117.5 kg (259 lb 1.6 oz)  Height:        Intake/Output Summary (Last 24 hours) at 11/13/16 1030 Last data filed at 11/13/16 0900  Gross per 24 hour  Intake              840 ml  Output             1800 ml  Net             -960 ml   Filed Weights   11/12/16 0537 11/13/16 0641 11/13/16 0746  Weight: 118.5 kg (261 lb 3.2 oz) 118.5 kg (261 lb 2.9 oz) 117.5 kg (259 lb 1.6 oz)    Exam:  General exam: Awake alert, volume overloaded, in no apparent distress Respiratory system: Diffuse crackles at bases. No increased work of breathing. Cardiovascular system: S1 & S2 heard  Gastrointestinal system: Abdomen is nondistended, soft and nontender. Normal bowel sounds heard. Central  nervous system: Alert and oriented. No focal neurological deficits. Extremities: 1+ edema bilateral lower extremities less pitting than prior exams.  Data Reviewed: Basic Metabolic Panel:  Recent Labs Lab 11/09/16 1140 11/10/16 1920 11/11/16 0448 11/12/16 0539 11/13/16 0541  NA 136 137 140 141 138  K 5.2* 4.8 4.3 4.4 4.2  CL 103 105 106 105 102  CO2 25 23 28 29 28   GLUCOSE 160* 80 129* 80 137*  BUN 61* 61* 61* 59* 63*  CREATININE 2.45* 2.36* 2.50* 2.57* 2.68*  CALCIUM 9.4 9.4 8.8* 8.8* 8.7*   Liver Function Tests:  Recent Labs Lab 11/10/16 1920  AST 26  ALT 24  ALKPHOS 45  BILITOT 0.8  PROT 7.3  ALBUMIN 3.8   No results for input(s): LIPASE, AMYLASE in the last 168 hours. No results for input(s): AMMONIA in the last 168 hours. CBC:  Recent Labs Lab 11/10/16 1920  WBC 7.5  NEUTROABS 4.8  HGB 10.8*  HCT 33.4*  MCV 93.6  PLT 281   Cardiac Enzymes:  Recent Labs Lab 11/10/16 1920 11/11/16 0007 11/11/16 0448 11/11/16 1016  TROPONINI 0.54* 0.45* 0.45* 0.42*   CBG (last 3)   Recent Labs  11/12/16 1611 11/12/16 2124 11/13/16 0722  GLUCAP 148* 212* 108*   No results found for this or any previous visit (from the past 240 hour(s)).   Studies: Nm Myocar Multi W/spect W/wall Motion / Ef  Result Date: 11/13/2016  No diagnostic ST segment changes to indicate ischemia.  Large, severe intensity, fixed defect involving the mid to apical anterior, anteroseptal, septal, and inferoseptal myocardium most consistent with large infarct scar. There is a trivial region of partial reversibility toward the apex suggesting minor peri-infarct ischemia.  This is a high risk study based on perfusion defect size.  Nuclear stress EF: 45%.    Scheduled Meds: . aspirin  81 mg Oral QHS  . clopidogrel  75 mg Oral QHS  . fenofibrate  160 mg Oral QHS  . insulin aspart  0-9 Units Subcutaneous TID WC  . insulin aspart  6 Units Subcutaneous TID WC  . insulin detemir  30 Units  Subcutaneous QHS  . isosorbide mononitrate  30 mg Oral Daily  . metoprolol tartrate  50 mg Oral BID WC  . sodium chloride flush  3 mL Intravenous Q12H   Continuous Infusions:  Active Problems:   DM type 2 (diabetes mellitus, type 2) ([place])   Essential hypertension   CAD- MI/RCA PCI-DES x2 2012, and RCA DES Jan 2014   Hyperlipidemia   Noncompliance   Anemia, normocytic normochromic   CAD (coronary artery disease)   Pericardial effusion   SOB (shortness of breath)   Acute kidney injury superimposed on chronic kidney disease (HCC)   Elevated troponin  Acute on chronic diastolic CHF (congestive heart failure) ([place])  Time spent:   Irwin Brakeman, MD, FAAFP [facility] Pager [phone]  If 7PM-7AM, please contact night-coverage [email or web address] Password [email or web address] 11/13/2016, 10:30 AM    LOS: 3 days

## 2016-11-13 NOTE — Progress Notes (Signed)
Progress Note  Patient Name: Eugene [name] Date of Encounter: 11/13/2016  Primary Cardiologist: Dr. Kate Sable  Subjective   Still short of breath moving around, but does feel better in general compared to presentation. No chest pain. No palpitations. No orthopnea or PND.  Inpatient Medications    Scheduled Meds: . aspirin  81 mg Oral QHS  . clopidogrel  75 mg Oral QHS  . fenofibrate  160 mg Oral QHS  . furosemide  40 mg Intravenous Q12H  . hydrALAZINE  25 mg Oral BID  . insulin aspart  0-9 Units Subcutaneous TID WC  . insulin aspart  6 Units Subcutaneous TID WC  . insulin detemir  30 Units Subcutaneous QHS  . isosorbide mononitrate  30 mg Oral Daily  . metoprolol tartrate  50 mg Oral BID WC  . regadenoson      . sodium chloride flush  3 mL Intravenous Q12H  . sodium chloride flush        PRN Meds: sodium chloride, acetaminophen, allopurinol, ondansetron (ZOFRAN) IV, sodium chloride flush, technetium tetrofosmin, technetium tetrofosmin   Vital Signs    Vitals:   11/12/16 1406 11/12/16 2142 11/13/16 0641 11/13/16 0746  BP: 105/62 110/61 111/64   Pulse: 75 72 68   Resp: 18 18 16    Temp: 98 F (36.7 C) 97.9 F (36.6 C) 97.9 F (36.6 C)   TempSrc: Oral Oral Oral   SpO2: 97% 98% 97%   Weight:   261 lb 2.9 oz (118.5 kg) 259 lb 1.6 oz (117.5 kg)  Height:        Intake/Output Summary (Last 24 hours) at 11/13/16 0814 Last data filed at 11/13/16 0641  Gross per 24 hour  Intake              720 ml  Output             1600 ml  Net             -880 ml   Filed Weights   11/12/16 0537 11/13/16 0641 11/13/16 0746  Weight: 261 lb 3.2 oz (118.5 kg) 261 lb 2.9 oz (118.5 kg) 259 lb 1.6 oz (117.5 kg)    Telemetry    Sinus rhythm. Personally reviewed.  ECG    Tracing from 11/10/2016 shows sinus rhythm with R' in lead V1 and V2, old inferior infarct pattern, poor R-wave progression rule out old anterior infarct pattern. Personally reviewed.  Physical Exam     GEN: Obese male, no acute distress.   Neck: No JVD. Cardiac: RRR, no murmur or gallop.  Respiratory: Nonlabored. Clear to auscultation bilaterally. GI: Soft, nontender, bowel sounds present. MS:  Improving leg edema; No deformity.  Labs    Chemistry Recent Labs Lab 11/10/16 1920 11/11/16 0448 11/12/16 0539 11/13/16 0541  NA 137 140 141 138  K 4.8 4.3 4.4 4.2  CL 105 106 105 102  CO2 23 28 29 28   GLUCOSE 80 129* 80 137*  BUN 61* 61* 59* 63*  CREATININE 2.36* 2.50* 2.57* 2.68*  CALCIUM 9.4 8.8* 8.8* 8.7*  PROT 7.3  --   --   --   ALBUMIN 3.8  --   --   --   AST 26  --   --   --   ALT 24  --   --   --   ALKPHOS 45  --   --   --   BILITOT 0.8  --   --   --  GFRNONAA 28* 27* 26* 24*  GFRAA 33* 31* 30* 28*  ANIONGAP 9 6 7 8      Hematology Recent Labs Lab 11/10/16 1920  WBC 7.5  RBC 3.57*  HGB 10.8*  HCT 33.4*  MCV 93.6  MCH 30.3  MCHC 32.3  RDW 14.9  PLT 281    Cardiac Enzymes Recent Labs Lab 11/10/16 1920 11/11/16 0007 11/11/16 0448 11/11/16 1016  TROPONINI 0.54* 0.45* 0.45* 0.42*   No results for input(s): TROPIPOC in the last 168 hours.   BNP Recent Labs Lab 11/10/16 1920  BNP 974.0*     Radiology    No results found.  Cardiac Studies   Echocardiogram 11/09/2016: Study Conclusions  - Left ventricle: There was focal basal hypertrophy. Systolic   function was normal. The estimated ejection fraction was in the   range of 50% to 55%. The study is not technically sufficient to   allow evaluation of LV diastolic function. - Regional wall motion abnormality: Hypokinesis of the mid anterior   and basal-mid anteroseptal myocardium. - Pericardium, extracardiac: There is a moderate sized left sided   pericardial effusion measuring 1.4 cm in diastole. There is no   evidence of tamponade physiology by echo. Effusion appears mildly   improved compared to the 11/03/16 study - Limited study to evaluate pericardial effusion.  Patient Profile      61 y.o. male with history of CAD including DES to PDA/RCA in 2012, STEMI in 2014 with DES to [place] with mild-moderate residual disease otherwise, chronic diastolic heart failure, hypertension, hyperlipidemia with statin intolerance, CKD stage 3 with creatinine in the 2.0-2.5 range, type 2 diabetes mellitus, OSA, GERD, and pericardial effusion status post prior window in 2014. He presents now with recurring and worsening dyspnea on exertion, treated for acute on chronic diastolic heart failure, and with low-level abnormal troponin I. Follow-up echocardiography shows LVEF 50-55% with wall motion abnormalities and moderate pericardial effusion. Inpatient Myoview is arranged to assess ischemic burden and help to guide treatment options.  Assessment & Plan    1. Acute on chronic diastolic heart failure, diuresed approximately 3 L on IV Lasix so far. Does admit to some recent sodium indiscretion, but otherwise compliance with his outpatient medications.  2. Elevated troponin I, 0.54 down to 0.42. Recently hospitalized at [facility] with medically managed NSTEMI and peak troponin I of 3.03 with plan for outpatient ischemic workup and medical therapy. LVEF 50-55% with wall motion abnormalities noted.  3. Moderate sized pericardial effusion.  4. CKD, stage 3. Current creatinine up to 2.6 with diuresis.  5. Hyperlipidemia with statin intolerance.  6. Essential hypertension, blood pressure adequately controlled.  7. CAD as outlined above with prior PCI and the RCA distribution and otherwise medically managed left system disease as of 2014.  Patient undergoing Lexiscan Myoview today on medical therapy to assess ischemic burden. Plan to hold Lasix for now in light of reasonable diuresis and bump in creatinine. Otherwise continue aspirin, Plavix, fenofibrate, hydralazine, Imdur, and Lopressor. Further plans to follow regarding medical therapy versus invasive cardiac testing.  Signed, Rozann Lesches, MD   11/13/2016, 8:14 AM

## 2016-11-13 NOTE — Progress Notes (Signed)
Study Result    No diagnostic ST segment changes to indicate ischemia.  Large, severe intensity, fixed defect involving the mid to apical anterior, anteroseptal, septal, and inferoseptal myocardium most consistent with large infarct scar. There is a trivial region of partial reversibility toward the apex suggesting minor peri-infarct ischemia.  This is a high risk study based on perfusion defect size.  Nuclear stress EF: 45%.     Nuc result reviewed with Dr. Domenic Polite. Large infarct scar in LAD distribution but no large ischemic burden - would manage medically for now. BP prevents aggressive med titration. Will d/c hydralazine and follow BP - if BP high enough in the morning would plan to increase isosorbide. Hold diuretic today and f/u Cr in AM. Reviewed plan with patient. Would also benefit from nephrology referral at discharge. Dayna Dunn PA-C

## 2016-11-14 ENCOUNTER — Ambulatory Visit: Payer: Commercial Managed Care - HMO | Admitting: Cardiovascular Disease

## 2016-11-14 ENCOUNTER — Telehealth: Payer: Self-pay | Admitting: Family Medicine

## 2016-11-14 DIAGNOSIS — N183 Chronic kidney disease, stage 3 (moderate): Secondary | ICD-10-CM

## 2016-11-14 DIAGNOSIS — Z794 Long term (current) use of insulin: Secondary | ICD-10-CM

## 2016-11-14 DIAGNOSIS — E1122 Type 2 diabetes mellitus with diabetic chronic kidney disease: Secondary | ICD-10-CM | POA: Diagnosis present

## 2016-11-14 LAB — BASIC METABOLIC PANEL
ANION GAP: 6 (ref 5–15)
BUN: 58 mg/dL — ABNORMAL HIGH (ref 6–20)
CALCIUM: 8.6 mg/dL — AB (ref 8.9–10.3)
CO2: 29 mmol/L (ref 22–32)
Chloride: 101 mmol/L (ref 101–111)
Creatinine, Ser: 2.47 mg/dL — ABNORMAL HIGH (ref 0.61–1.24)
GFR, EST AFRICAN AMERICAN: 31 mL/min — AB (ref >60)
GFR, EST NON AFRICAN AMERICAN: 27 mL/min — AB (ref >60)
Glucose, Bld: 110 mg/dL — ABNORMAL HIGH (ref 65–99)
Potassium: 4.3 mmol/L (ref 3.5–5.1)
SODIUM: 136 mmol/L (ref 135–145)

## 2016-11-14 LAB — GLUCOSE, CAPILLARY
Glucose-Capillary: 83 mg/dL (ref 65–99)
Glucose-Capillary: 125 mg/dL — ABNORMAL HIGH (ref 65–99)

## 2016-11-14 LAB — CBC
HCT: 32.1 % — ABNORMAL LOW (ref 39.0–52.0)
HEMOGLOBIN: 10.4 g/dL — AB (ref 13.0–17.0)
MCH: 30.4 pg (ref 26.0–34.0)
MCHC: 32.4 g/dL (ref 30.0–36.0)
MCV: 93.9 fL (ref 78.0–100.0)
PLATELETS: 209 10*3/uL (ref 150–400)
RBC: 3.42 MIL/uL — ABNORMAL LOW (ref 4.22–5.81)
RDW: 14.9 % (ref 11.5–15.5)
WBC: 5.8 10*3/uL (ref 4.0–10.5)

## 2016-11-14 MED ORDER — INSULIN DETEMIR 100 UNIT/ML FLEXPEN: 60.0000 [IU] | PEN_INJECTOR | Freq: Every day | SUBCUTANEOUS | Status: DC

## 2016-11-14 MED ORDER — ISOSORBIDE MONONITRATE ER 60 MG PO TB24: 60.0000 mg | ORAL_TABLET | Freq: Every day | ORAL | Status: DC

## 2016-11-14 MED ORDER — TORSEMIDE 20 MG PO TABS: 20.0000 mg | ORAL_TABLET | Freq: Every day | ORAL | 0 refills | Status: DC

## 2016-11-14 MED ORDER — ISOSORBIDE MONONITRATE ER 60 MG PO TB24: 60.0000 mg | ORAL_TABLET | Freq: Every day | ORAL | 0 refills | Status: DC

## 2016-11-14 NOTE — Telephone Encounter (Signed)
Patient discharged from the hospital today. Please call him make sure he understands his medication regimen-make sure the patient is aware to follow-up with cardiology in one week. Please arrange for the patient to do CBC and met 7 in one week. Also arrange for an office visit with [place] in 2 weeks. Patient should bring all of his medications to that visit. If he is having any problems please let me know. This appointment needs to be either April 3 or fourth

## 2016-11-14 NOTE — Progress Notes (Signed)
Patient states understanding of discharge instructions.  

## 2016-11-14 NOTE — Progress Notes (Signed)
Progress Note  Patient Name: Eugene [name] Date of Encounter: 11/14/2016  Primary Cardiologist: Dr. Kate Sable  Subjective   No further chest pain, breathing much better.  Inpatient Medications    Scheduled Meds: . aspirin  81 mg Oral QHS  . clopidogrel  75 mg Oral QHS  . fenofibrate  160 mg Oral QHS  . insulin aspart  0-9 Units Subcutaneous TID WC  . insulin aspart  6 Units Subcutaneous TID WC  . insulin detemir  30 Units Subcutaneous QHS  . isosorbide mononitrate  30 mg Oral Daily  . metoprolol tartrate  50 mg Oral BID WC  . sodium chloride flush  3 mL Intravenous Q12H    PRN Meds: sodium chloride, acetaminophen, allopurinol, ondansetron (ZOFRAN) IV, sodium chloride flush   Vital Signs    Vitals:   11/13/16 0746 11/13/16 1443 11/13/16 2107 11/14/16 0607  BP:  125/68 (!) 144/85 134/62  Pulse:  66 60 66  Resp:  18 18 18   Temp:  98.2 F (36.8 C) 97.6 F (36.4 C) 97.8 F (36.6 C)  TempSrc:  Oral Oral Oral  SpO2:  93% 94% 95%  Weight: 259 lb 1.6 oz (117.5 kg)   261 lb 3.2 oz (118.5 kg)  Height:        Intake/Output Summary (Last 24 hours) at 11/14/16 0817 Last data filed at 11/14/16 1027  Gross per 24 hour  Intake              840 ml  Output             1100 ml  Net             -260 ml   Filed Weights   11/13/16 0641 11/13/16 0746 11/14/16 0607  Weight: 261 lb 2.9 oz (118.5 kg) 259 lb 1.6 oz (117.5 kg) 261 lb 3.2 oz (118.5 kg)    Telemetry    NSR - Personally Reviewed  ECG    NSR with incomplete RBBB ant/inf inarct 11/12/16 - Personally Reviewed  Physical Exam   GEN: No acute distress.   Neck: No JVD Cardiac: RRR, no murmurs, rubs, or gallops.  Respiratory: Clear to auscultation bilaterally. GI: Soft, nontender, non-distended  MS: trace ankle edema; No deformity. Neuro:  Nonfocal  Psych: Normal affect   Labs    Chemistry Recent Labs Lab 11/10/16 1920  11/12/16 0539 11/13/16 0541 11/14/16 0526  NA 137  < > 141 138 136  K  4.8  < > 4.4 4.2 4.3  CL 105  < > 105 102 101  CO2 23  < > 29 28 29   GLUCOSE 80  < > 80 137* 110*  BUN 61*  < > 59* 63* 58*  CREATININE 2.36*  < > 2.57* 2.68* 2.47*  CALCIUM 9.4  < > 8.8* 8.7* 8.6*  PROT 7.3  --   --   --   --   ALBUMIN 3.8  --   --   --   --   AST 26  --   --   --   --   ALT 24  --   --   --   --   ALKPHOS 45  --   --   --   --   BILITOT 0.8  --   --   --   --   GFRNONAA 28*  < > 26* 24* 27*  GFRAA 33*  < > 30* 28* 31*  ANIONGAP 9  < >  7 8 6   < > = values in this interval not displayed.   Hematology Recent Labs Lab 11/10/16 1920 11/14/16 0526  WBC 7.5 5.8  RBC 3.57* 3.42*  HGB 10.8* 10.4*  HCT 33.4* 32.1*  MCV 93.6 93.9  MCH 30.3 30.4  MCHC 32.3 32.4  RDW 14.9 14.9  PLT 281 209    Cardiac Enzymes Recent Labs Lab 11/10/16 1920 11/11/16 0007 11/11/16 0448 11/11/16 1016  TROPONINI 0.54* 0.45* 0.45* 0.42*   No results for input(s): TROPIPOC in the last 168 hours.   BNP Recent Labs Lab 11/10/16 1920  BNP 974.0*     DDimer No results for input(s): DDIMER in the last 168 hours.   Radiology    Nm Myocar Multi W/spect W/wall Motion / Ef  Result Date: 11/13/2016  No diagnostic ST segment changes to indicate ischemia.  Large, severe intensity, fixed defect involving the mid to apical anterior, anteroseptal, septal, and inferoseptal myocardium most consistent with large infarct scar. There is a trivial region of partial reversibility toward the apex suggesting minor peri-infarct ischemia.  This is a high risk study based on perfusion defect size.  Nuclear stress EF: 45%.     Cardiac Studies   Lexiscan Myoview 11/13/2016:     No diagnostic ST segment changes to indicate ischemia.  Large, severe intensity, fixed defect involving the mid to apical anterior, anteroseptal, septal, and inferoseptal myocardium most consistent with large infarct scar. There is a trivial region of partial reversibility toward the apex suggesting minor peri-infarct  ischemia.  This is a high risk study based on perfusion defect size.  Nuclear stress EF: 45%.      Echocardiogram 11/09/2016: Study Conclusions   - Left ventricle: There was focal basal hypertrophy. Systolic   function was normal. The estimated ejection fraction was in the   range of 50% to 55%. The study is not technically sufficient to   allow evaluation of LV diastolic function. - Regional wall motion abnormality: Hypokinesis of the mid anterior   and basal-mid anteroseptal myocardium. - Pericardium, extracardiac: There is a moderate sized left sided   pericardial effusion measuring 1.4 cm in diastole. There is no   evidence of tamponade physiology by echo. Effusion appears mildly   improved compared to the 11/03/16 study - Limited study to evaluate pericardial effusion.   Patient Profile   61 y.o. male with history of CAD including DES to PDA/RCA in 2012, STEMI in 2014 with DES to [place] with mild-moderate residual disease otherwise, chronic diastolic heart failure, hypertension, hyperlipidemia with statin intolerance, CKD stage 3 with creatinine in the 2.0-2.5 range, type 2 diabetes mellitus, OSA, GERD, and pericardial effusion status post prior window in 2014. He presents now with recurring and worsening dyspnea on exertion, treated for acute on chronic diastolic heart failure, and with low-level abnormal troponin I. Follow-up echocardiography shows LVEF 50-55% with wall motion abnormalities and moderate pericardial effusion. Inpatient Myoview shows large defect consistent  with scar and only minor peri-infarct ischemia.  Assessment & Plan    1. Acute on chronic diastolic heart failure, diuresed approximately 3.5 L on IV Lasix. Mild ankle edema. Does admit to some recent sodium indiscretion, but otherwise compliance with his outpatient medications. Hydralazine stopped. Takes Demadex 10 mg alternating with 20 mg every other day at home. Can probably go home on this dose with early f/u  with Dr. Bronson Ing.   2. Elevated troponin I, 0.54 down to 0.42. Recently hospitalized at [facility] with medically managed NSTEMI and  peak troponin I of 3.03 with plan for outpatient ischemic workup and medical therapy. LVEF 50-55% with wall motion abnormalities noted. Myoview during this stay shows with large fixed defect consistent with scar and only minor peri-infarct ischemia. With elevated creatinine and no chest pain now would continue medically therapy at this point. BP stable to increase Imdur.   3. Moderate sized pericardial effusion.   4. CKD, stage 3. Current creatinine up to 2.6 with diuresis.lasix held yest and Crt 2.47   5. Hyperlipidemia with statin intolerance.   6. Essential hypertension, blood pressure adequately controlled.   7. CAD as outlined above with prior PCI and the RCA distribution and otherwise medically managed left system disease as of 2014.   Signed, Ermalinda Barrios, PA-C  11/14/2016, 8:17 AM     Attending note:  Patient seen and examined. Case discussed with Ms. Bonnell Public PA-C. Lexiscan Myoview from yesterday shows relatively large area of scar in the LAD distribution with only minor peri-infarct ischemia. LVEF 50-55% by echocardiography. Without high ischemic burden and in the setting of clinical improvement, would recommend medical therapy at this time and arrange close office follow-up with Dr. Bronson Ing. Anticipate discharge home today. Continue aspirin, Plavix, fenofibrate, Imdur, and Lopressor. Hold off resuming hydralazine at this point. Would go back on Demadex at 20 mg daily. We will arrange an office visit.  Satira Sark, M.D., F.A.C.C.

## 2016-11-14 NOTE — Discharge Summary (Addendum)
Physician Discharge Summary  Eugene [name] RAQ:[id number] DOB: July 31, 1956 DOA: 11/10/2016  PCP: [name], MD  Admit date: 11/10/2016 Discharge date: 11/14/2016  Admitted From: home Disposition:  home  Recommendations for Outpatient Follow-up:  1. Follow up with PCP in 1-2 weeks 2. Please obtain BMP/CBC in one week 3. Consider outpatient nephrology referral for chronic kidney disease 4. Consider outpatient pulmonology referral for shortness of breath, has seen Dr. Luan Pulling in the past 5. He will follow-up with cardiology in one week.  Home Health: Equipment/Devices  Discharge Condition: stable CODE STATUS: full code Diet recommendation: Heart Healthy / Carb Modified   Brief/Interim Summary: 61 year old male with a history of coronary artery disease, chronic diastolic congestive heart failure, presented to the hospital with complaints of shortness of breath. Found to have decompensated heart failure and Low-level abnormal troponin. He was admitted for further treatments.  Discharge Diagnoses:  Active Problems:   DM type 2 (diabetes mellitus, type 2) ([place])   Essential hypertension   CAD- MI/RCA PCI-DES x2 2012, and RCA DES Jan 2014   Hyperlipidemia   Noncompliance   Anemia, normocytic normochromic   CAD (coronary artery disease)   Pericardial effusion   SOB (shortness of breath)   Acute kidney injury superimposed on chronic kidney disease (HCC)   Elevated troponin   Acute on chronic diastolic CHF (congestive heart failure) ([place])   Cardiomyopathy ([place])   DM type 2 causing CKD stage 3 ([place])  1. Acute on chronic diastolic CHF exacerbation with volume overload - likely exacerbated by recent decrease in diuretic dose.  He was diuresed with IV furosemide now and much improved.   cardiology was following the patient and assisted with diuresis. He has not been transitioned back to oral Demadex. Plan is to follow-up with cardiology in the next 1 week as an outpatient. 2. Dyspnea  - Pt has increased dyspnea with ambulation, this appears to be somewhat of a chronic issue. Ambulatory pulse ox showed that oxygen saturations remained above 94% while ambulating. He does not qualify for supplemental oxygen. He reports that his dyspnea on exertion has been somewhat more chronic. I have advised him to follow with Dr. Luan Pulling who he has seen in the past. May benefit from outpatient pulmonary function tests..  3. Type 2 diabetes mellitus-monitor with sliding scale coverage. Blood glucose testing ordered. Reduced basal insulin 3/19 because of some lower blood glucose readings.  Recent A1c was in fair range. We'll continue home regimen on discharge. Patient follow-up with primary care for further titration.   4. Essential hypertension-monitoring closely, currently well controlled. 5. Coronary artery disease status post recent NSTEMI-seen by cardiology and underwent stress testing during this admission. Found to have any significant reversible ischemia. Recommendations are for continued medical management. . 6. Pericardial effusion-improved per echo done 3/16. 7. Acute respiratory distress secondary to acute on chronic diastolic heart failure exacerbation-resolved now. Resolved with diuresis. He no longer requires supplemental oxygen. He does still feel somewhat short of breath on exertion, although this may be multifactorial. He's been advised to follow-up with pulmonology. He's also been advised to raise the head of his hospital bed at home. 8. Chronic kidney disease stage III. Creatinine appeared stable with diuresis. Continue to follow as an outpatient. Consider referral to nephrology as an outpatient for further management.  Discharge Instructions  Discharge Instructions    Diet - low sodium heart healthy    Complete by:  As directed    Increase activity slowly    Complete by:  As directed      Allergies as of 11/14/2016      Reactions   Hydrocodone Nausea And Vomiting   Lisinopril  Cough   Neurontin [gabapentin] Other (See Comments)   Reaction:  Suicidal thoughts    Statins Other (See Comments)   Reaction:  Muscle pain    Metformin And Related Diarrhea   Norvasc [amlodipine Besylate] Swelling, Other (See Comments)   Reaction:  Pedal edema      Medication List    STOP taking these medications   hydrALAZINE 50 MG tablet Commonly known as:  APRESOLINE     TAKE these medications   acetaminophen 500 MG tablet Commonly known as:  TYLENOL Take 1,000 mg by mouth every 6 (six) hours as needed for mild pain, moderate pain, fever or headache.   allopurinol 100 MG tablet Commonly known as:  ZYLOPRIM Take 100 mg by mouth daily as needed (for gout flares).   ALPRAZolam 0.5 MG tablet Commonly known as:  XANAX TAKE 1/2-1 TABLET BY MOUTH AT BEDTIME AS NEEDED What changed:  See the new instructions.   aspirin 81 MG chewable tablet Chew 81 mg by mouth at bedtime.   carboxymethylcellulose 0.5 % Soln Commonly known as:  REFRESH PLUS Place 1 drop into both eyes 3 (three) times daily as needed.   clopidogrel 75 MG tablet Commonly known as:  PLAVIX TAKE 1 TABLET AT BEDTIME   colchicine 0.6 MG tablet Take 0.6 mg by mouth 2 (two) times daily as needed (for gout flares).   fenofibrate 160 MG tablet TAKE 1 TABLET AT BEDTIME   glimepiride 1 MG tablet Commonly known as:  AMARYL Take twice a day with meals What changed:  how much to take  how to take this  when to take this  additional instructions   Insulin Detemir 100 UNIT/ML Pen Commonly known as:  LEVEMIR FLEXPEN Inject 60 Units into the skin at bedtime.   isosorbide mononitrate 60 MG 24 hr tablet Commonly known as:  IMDUR Take 1 tablet (60 mg total) by mouth daily. What changed:  medication strength  how much to take   metoprolol tartrate 25 MG tablet Commonly known as:  LOPRESSOR Take 50 mg by mouth 2 (two) times daily with a meal.   nitroGLYCERIN 0.4 MG SL tablet Commonly known as:   NITROSTAT Place 1 tablet (0.4 mg total) under the tongue every 5 (five) minutes x 3 doses as needed for chest pain.   torsemide 20 MG tablet Commonly known as:  DEMADEX Take 1 tablet (20 mg total) by mouth daily. What changed:  how much to take  how to take this  when to take this  additional instructions   traMADol 50 MG tablet Commonly known as:  ULTRAM TAKE 1 TABLET BY MOUTH EVERY 6 HOURS AS NEEDED FOR PAIN.      Follow-up Information    Kate Sable, MD On 11/29/2016.   Specialty:  Cardiology Why:  at 10:40 am. [facility] office) Contact information: [address] [place] [address] [phone]          Allergies  Allergen Reactions  . Hydrocodone Nausea And Vomiting  . Lisinopril Cough  . Neurontin [Gabapentin] Other (See Comments)    Reaction:  Suicidal thoughts   . Statins Other (See Comments)    Reaction:  Muscle pain   . Metformin And Related Diarrhea  . Norvasc [Amlodipine Besylate] Swelling and Other (See Comments)    Reaction:  Pedal edema  Consultations:  cardiology   Procedures/Studies: Dg Chest 2 View  Result Date: 11/10/2016 CLINICAL DATA:  Short of breath, congestive heart failure. EXAM: CHEST  2 VIEW COMPARISON:  11/02/2016 FINDINGS: Normal cardiac silhouette. There is bibasilar linear nodular airspace opacities slightly improved from comparison exam. Small effusions noted. Degenerative osteophytosis of the spine. IMPRESSION: Mild improvement in interstitial and pulmonary edema pattern. Electronically Signed   By: Suzy Bouchard M.D.   On: 11/10/2016 18:05   Dg Chest 2 View  Result Date: 11/02/2016 CLINICAL DATA:  Shortness of breath. EXAM: CHEST  2 VIEW COMPARISON:  Two-view chest x-ray 11/09/2015. FINDINGS: The heart is enlarged. There is a mild diffuse interstitial pattern compatible with edema. Small effusions are present. Mild dependent atelectasis is present bilaterally without other focal airspace disease. The visualized  soft tissues and bony thorax are unremarkable. IMPRESSION: 1. Cardiomegaly with mild edema and small effusions compatible with congestive heart failure. 2. No significant focal airspace disease. Electronically Signed   By: San Morelle M.D.   On: 11/02/2016 16:27   Nm Myocar Multi W/spect W/wall Motion / Ef  Result Date: 11/13/2016  No diagnostic ST segment changes to indicate ischemia.  Large, severe intensity, fixed defect involving the mid to apical anterior, anteroseptal, septal, and inferoseptal myocardium most consistent with large infarct scar. There is a trivial region of partial reversibility toward the apex suggesting minor peri-infarct ischemia.  This is a high risk study based on perfusion defect size.  Nuclear stress EF: 45%.    Echo: - Left ventricle: There was focal basal hypertrophy. Systolic   function was normal. The estimated ejection fraction was in the   range of 50% to 55%. The study is not technically sufficient to   allow evaluation of LV diastolic function. - Regional wall motion abnormality: Hypokinesis of the mid anterior   and basal-mid anteroseptal myocardium. - Pericardium, extracardiac: There is a moderate sized left sided   pericardial effusion measuring 1.4 cm in diastole. There is no   evidence of tamponade physiology by echo. Effusion appears mildly   improved compared to the 11/03/16 study - Limited study to evaluate pericardial effusion   Subjective: Feeling better. No chest pain. Still has some shortness of breath on exertion, but is not hypoxic.  Discharge Exam: Vitals:   11/14/16 0607 11/14/16 1419  BP: 134/62 132/84  Pulse: 66 67  Resp: 18 18  Temp: 97.8 F (36.6 C) 97.9 F (36.6 C)   Vitals:   11/13/16 1443 11/13/16 2107 11/14/16 0607 11/14/16 1419  BP: 125/68 (!) 144/85 134/62 132/84  Pulse: 66 60 66 67  Resp: 18 18 18 18   Temp: 98.2 F (36.8 C) 97.6 F (36.4 C) 97.8 F (36.6 C) 97.9 F (36.6 C)  TempSrc: Oral Oral Oral  Oral  SpO2: 93% 94% 95% 96%  Weight:   118.5 kg (261 lb 3.2 oz)   Height:        General: Pt is alert, awake, not in acute distress Cardiovascular: RRR, S1/S2 +, no rubs, no gallops Respiratory: CTA bilaterally, no wheezing, no rhonchi Abdominal: Soft, NT, ND, bowel sounds + Extremities: 1+ edema, no cyanosis    The results of significant diagnostics from this hospitalization (including imaging, microbiology, ancillary and laboratory) are listed below for reference.     Microbiology: No results found for this or any previous visit (from the past 240 hour(s)).   Labs: BNP (last 3 results)  Recent Labs  11/02/16 1605 11/10/16 1920  BNP 721.9* 974.0*  Basic Metabolic Panel:  Recent Labs Lab 11/10/16 1920 11/11/16 0448 11/12/16 0539 11/13/16 0541 11/14/16 0526  NA 137 140 141 138 136  K 4.8 4.3 4.4 4.2 4.3  CL 105 106 105 102 101  CO2 23 28 29 28 29   GLUCOSE 80 129* 80 137* 110*  BUN 61* 61* 59* 63* 58*  CREATININE 2.36* 2.50* 2.57* 2.68* 2.47*  CALCIUM 9.4 8.8* 8.8* 8.7* 8.6*   Liver Function Tests:  Recent Labs Lab 11/10/16 1920  AST 26  ALT 24  ALKPHOS 45  BILITOT 0.8  PROT 7.3  ALBUMIN 3.8   No results for input(s): LIPASE, AMYLASE in the last 168 hours. No results for input(s): AMMONIA in the last 168 hours. CBC:  Recent Labs Lab 11/10/16 1920 11/14/16 0526  WBC 7.5 5.8  NEUTROABS 4.8  --   HGB 10.8* 10.4*  HCT 33.4* 32.1*  MCV 93.6 93.9  PLT 281 209   Cardiac Enzymes:  Recent Labs Lab 11/10/16 1920 11/11/16 0007 11/11/16 0448 11/11/16 1016  TROPONINI 0.54* 0.45* 0.45* 0.42*   BNP: Invalid input(s): POCBNP CBG:  Recent Labs Lab 11/13/16 1142 11/13/16 1617 11/13/16 2105 11/14/16 0742 11/14/16 1132  GLUCAP 207* 119* 159* 83 125*   D-Dimer No results for input(s): DDIMER in the last 72 hours. Hgb A1c No results for input(s): HGBA1C in the last 72 hours. Lipid Profile No results for input(s): CHOL, HDL, LDLCALC, TRIG,  CHOLHDL, LDLDIRECT in the last 72 hours. Thyroid function studies No results for input(s): TSH, T4TOTAL, T3FREE, THYROIDAB in the last 72 hours.  Invalid input(s): FREET3 Anemia work up No results for input(s): VITAMINB12, FOLATE, FERRITIN, TIBC, IRON, RETICCTPCT in the last 72 hours. Urinalysis    Component Value Date/Time   COLORURINE YELLOW 01/21/2015 [place] 01/21/2015 1053   LABSPEC 1.025 01/21/2015 1053   PHURINE 5.5 01/21/2015 1053   GLUCOSEU 250 (A) 01/21/2015 1053   HGBUR NEGATIVE 01/21/2015 1053   BILIRUBINUR NEGATIVE 01/21/2015 1053   KETONESUR NEGATIVE 01/21/2015 1053   PROTEINUR >300 (A) 01/21/2015 1053   UROBILINOGEN 0.2 01/21/2015 1053   NITRITE NEGATIVE 01/21/2015 1053   LEUKOCYTESUR Trace (A) 10/09/2016 1052   Sepsis Labs Invalid input(s): PROCALCITONIN,  WBC,  LACTICIDVEN Microbiology No results found for this or any previous visit (from the past 240 hour(s)).   Time coordinating discharge: Over 30 minutes  SIGNED:   Kathie Dike, MD  [facility] 11/14/2016, 7:43 PM Pager   If 7PM-7AM, please contact night-coverage [email or web address] Password [email or web address]

## 2016-11-15 ENCOUNTER — Other Ambulatory Visit: Payer: Self-pay | Admitting: *Deleted

## 2016-11-15 DIAGNOSIS — D649 Anemia, unspecified: Secondary | ICD-10-CM

## 2016-11-15 DIAGNOSIS — I10 Essential (primary) hypertension: Secondary | ICD-10-CM

## 2016-11-15 NOTE — Telephone Encounter (Signed)
Discussed with pt. Pt verbalized understanding. Orders for bloodwork put in to do in one week. Pt transferred to front to schedule office visit with dr Nicki Reaper April 3rd or 4th.

## 2016-11-21 ENCOUNTER — Encounter (INDEPENDENT_AMBULATORY_CARE_PROVIDER_SITE_OTHER): Payer: Medicare HMO | Admitting: Ophthalmology

## 2016-11-21 DIAGNOSIS — H43813 Vitreous degeneration, bilateral: Secondary | ICD-10-CM | POA: Diagnosis not present

## 2016-11-21 DIAGNOSIS — E103512 Type 1 diabetes mellitus with proliferative diabetic retinopathy with macular edema, left eye: Secondary | ICD-10-CM | POA: Diagnosis not present

## 2016-11-21 DIAGNOSIS — D3132 Benign neoplasm of left choroid: Secondary | ICD-10-CM

## 2016-11-21 DIAGNOSIS — E103311 Type 1 diabetes mellitus with moderate nonproliferative diabetic retinopathy with macular edema, right eye: Secondary | ICD-10-CM | POA: Diagnosis not present

## 2016-11-21 DIAGNOSIS — H35033 Hypertensive retinopathy, bilateral: Secondary | ICD-10-CM

## 2016-11-21 DIAGNOSIS — I10 Essential (primary) hypertension: Secondary | ICD-10-CM | POA: Diagnosis not present

## 2016-11-21 DIAGNOSIS — E10311 Type 1 diabetes mellitus with unspecified diabetic retinopathy with macular edema: Secondary | ICD-10-CM

## 2016-11-22 ENCOUNTER — Other Ambulatory Visit: Payer: Self-pay | Admitting: *Deleted

## 2016-11-22 NOTE — Patient Outreach (Addendum)
[facility]) Care Management  11/22/2016  [place] 1955-09-14 [id number]  I reached out to Mr. Caratachea today in response to an Emmi red alert indicating that Mr. Massi had not weighed today. He told me that he was recently hospitalized for "heart problems" when he was admitted to the hospital with "too much fluid." He related that his "fluid pill" was decreased temporarily in response to a "kidney problems". Since his discharge from the hospital he says he has felt better and his fluid is "back to normal". Mr. Snellings is weighing daily and says he did weigh today. His weights have remained within 1 pound of his discharge weight since he came home from the hospital (258# - 259#). He says he is mildly short of breath and not to base line but improved.   Mr. Vallez has scheduled appointments with Dr. [name] next week on 11/27/16 and with Dr. Bronson Ing on 11/29/16.   Plan: Mr. Seto would like for me to call and check in on him in a few weeks and says he knows how to get in touch with his cardiologist if he needs anything. He says he knows he is to call the cardiologist for new or worsening symptoms, especially weight gain of 3# overnight or 5# in a week, worsening shortness of breath, chest pain, lightheadedness or dizziness, nausea or GI disturbances.   I will reach out to Mr. Basista the week of 12/03/16.    [place] [facility] Management  [phone]

## 2016-11-26 ENCOUNTER — Other Ambulatory Visit: Payer: Self-pay | Admitting: *Deleted

## 2016-11-26 NOTE — Patient Outreach (Signed)
Red EMMI alert on 11/23/16 for pt did not attend follow up appointment. Telephone call to pt with no answer to telephone and no option to leave voicemail.  Jacqlyn Larsen [facility], [place] Coordinator [phone]

## 2016-11-27 ENCOUNTER — Ambulatory Visit (INDEPENDENT_AMBULATORY_CARE_PROVIDER_SITE_OTHER): Payer: Medicare HMO | Admitting: Family Medicine

## 2016-11-27 ENCOUNTER — Encounter: Payer: Self-pay | Admitting: Family Medicine

## 2016-11-27 VITALS — BP 132/90 | Ht 73.0 in | Wt 270.0 lb

## 2016-11-27 DIAGNOSIS — E1122 Type 2 diabetes mellitus with diabetic chronic kidney disease: Secondary | ICD-10-CM

## 2016-11-27 DIAGNOSIS — G4733 Obstructive sleep apnea (adult) (pediatric): Secondary | ICD-10-CM | POA: Diagnosis not present

## 2016-11-27 DIAGNOSIS — N289 Disorder of kidney and ureter, unspecified: Secondary | ICD-10-CM

## 2016-11-27 DIAGNOSIS — I10 Essential (primary) hypertension: Secondary | ICD-10-CM

## 2016-11-27 DIAGNOSIS — G629 Polyneuropathy, unspecified: Secondary | ICD-10-CM

## 2016-11-27 DIAGNOSIS — R0600 Dyspnea, unspecified: Secondary | ICD-10-CM

## 2016-11-27 DIAGNOSIS — I5032 Chronic diastolic (congestive) heart failure: Secondary | ICD-10-CM

## 2016-11-27 DIAGNOSIS — Z794 Long term (current) use of insulin: Secondary | ICD-10-CM | POA: Diagnosis not present

## 2016-11-27 DIAGNOSIS — N183 Chronic kidney disease, stage 3 unspecified: Secondary | ICD-10-CM

## 2016-11-27 DIAGNOSIS — D649 Anemia, unspecified: Secondary | ICD-10-CM | POA: Diagnosis not present

## 2016-11-27 NOTE — Progress Notes (Signed)
   Subjective:    Patient ID: Eugene [name], male    DOB: 10-02-1955, 61 y.o.   MRN: [id number]  HPIFollow up hospitalization.  Having some shortness of breath and no energy.  Patients hospitalization lab work and studies were discussed in detail Transitional care for the patient. The patient was spoken with after hospitalization lab work was ordered but he failed to get the lab work done he was reviewed on his medicines and states he was doing a good job of taking them he states he gets very short of breath with activity but this is been going on for months he also relates occasional chest tightness he was recently treated in the hospital for CHF a increased his diuretic but currently have have him on Demadex 1 per day he states he watches his diet and tries to minimize salt he also tries minimize sugars in the diet has history of diabetes. He seems to have a decent understanding about his disease and the importance of [place] himself on a daily basis and reporting to [place] if he has increased shortness of breath or other problems that could indicate the onset of CHF and worsening  Review of Systems  Constitutional: Negative for activity change, appetite change and fatigue.  HENT: Negative for congestion.   Respiratory: Positive for shortness of breath. Negative for cough.   Cardiovascular: Negative for chest pain.  Gastrointestinal: Negative for abdominal pain.  Endocrine: Negative for polydipsia and polyphagia.  Neurological: Negative for weakness.  Psychiatric/Behavioral: Negative for confusion.       Objective:   Physical Exam  Constitutional: He appears well-nourished. No distress.  Cardiovascular: Normal rate, regular rhythm and normal heart sounds.   No murmur heard. Pulmonary/Chest: Effort normal and breath sounds normal. No respiratory distress.  Musculoskeletal: He exhibits edema.  Lymphadenopathy:    He has no cervical adenopathy.  Neurological: He is alert.  Psychiatric:  His behavior is normal.  Vitals reviewed.         Assessment & Plan:  Transitional care from CHF hospital stay  Having significant dyspnea will need to see pulmonology for further evaluation. I believe his dyspnea is partly related to diastolic failure as well as lung disease. Pulmonary function test most recently done 2016 patient does have sleep apnea issues we will refer to Dr. Luan Pulling more than likely will need up-to-date pulmonary studies and possible other interventions  Patient has significant advancement of chronic kidney disease associated with diabetes currently not at dialysis level but I do believe patient would benefit from being seen by nephrology for further evaluation and guidance currently taking 1 Demadex each morning for his CHF and fluid accumulation he will monitor his weight on a daily basis he will let us know if it starts going up I have given him verbal permission to increase his diuretic 1 in the morning to a new dose of 2 in the morning should his weight start going. We did discuss the importance of monitoring salt intake and avoiding excessive fluid intake.  Diabetes fair control will be bringing the patient back in June for further check of the A1c.

## 2016-11-28 ENCOUNTER — Other Ambulatory Visit: Payer: Self-pay | Admitting: *Deleted

## 2016-11-28 LAB — CBC WITH DIFFERENTIAL/PLATELET
BASOS ABS: 0 10*3/uL (ref 0.0–0.2)
BASOS: 1 %
EOS (ABSOLUTE): 0.2 10*3/uL (ref 0.0–0.4)
Eos: 3 %
HEMOGLOBIN: 11.2 g/dL — AB (ref 13.0–17.7)
Hematocrit: 35.5 % — ABNORMAL LOW (ref 37.5–51.0)
IMMATURE GRANS (ABS): 0 10*3/uL (ref 0.0–0.1)
IMMATURE GRANULOCYTES: 0 %
LYMPHS: 23 %
Lymphocytes Absolute: 1.2 10*3/uL (ref 0.7–3.1)
MCH: 28.9 pg (ref 26.6–33.0)
MCHC: 31.5 g/dL (ref 31.5–35.7)
MCV: 92 fL (ref 79–97)
MONOCYTES: 10 %
Monocytes Absolute: 0.5 10*3/uL (ref 0.1–0.9)
NEUTROS ABS: 3.3 10*3/uL (ref 1.4–7.0)
NEUTROS PCT: 63 %
PLATELETS: 200 10*3/uL (ref 150–379)
RBC: 3.87 x10E6/uL — ABNORMAL LOW (ref 4.14–5.80)
RDW: 15.5 % — ABNORMAL HIGH (ref 12.3–15.4)
WBC: 5.2 10*3/uL (ref 3.4–10.8)

## 2016-11-28 LAB — BASIC METABOLIC PANEL
BUN / CREAT RATIO: 20 (ref 9–20)
BUN: 38 mg/dL — AB (ref 6–24)
CALCIUM: 9.1 mg/dL (ref 8.7–10.2)
CHLORIDE: 102 mmol/L (ref 96–106)
CO2: 24 mmol/L (ref 18–29)
CREATININE: 1.89 mg/dL — AB (ref 0.76–1.27)
GFR calc Af Amer: 44 mL/min/{1.73_m2} — ABNORMAL LOW (ref >59)
GFR calc non Af Amer: 38 mL/min/{1.73_m2} — ABNORMAL LOW (ref >59)
GLUCOSE: 166 mg/dL — AB (ref 65–99)
Potassium: 5.6 mmol/L — ABNORMAL HIGH (ref 3.5–5.2)
Sodium: 142 mmol/L (ref 134–144)

## 2016-11-28 NOTE — Progress Notes (Unsigned)
BMET 

## 2016-11-28 NOTE — Patient Outreach (Signed)
11/28/16- Red EMMI alert, worsening and new problems, telephone call to patient, spoke with pt, HIPAA verified, pt reports " I'm doing fine, not sure why those showed up, nothing is wrong"  Pt states lost 2 pounds, no issues with edema, dyspnea, no other new concerns voiced.  Jacqlyn Larsen [facility], [place] Coordinator [phone]

## 2016-11-29 ENCOUNTER — Ambulatory Visit (INDEPENDENT_AMBULATORY_CARE_PROVIDER_SITE_OTHER): Payer: Medicare HMO | Admitting: Cardiovascular Disease

## 2016-11-29 ENCOUNTER — Encounter: Payer: Self-pay | Admitting: Cardiovascular Disease

## 2016-11-29 ENCOUNTER — Other Ambulatory Visit: Payer: Self-pay | Admitting: *Deleted

## 2016-11-29 VITALS — BP 110/70 | HR 71 | Ht 73.0 in | Wt 268.0 lb

## 2016-11-29 DIAGNOSIS — E782 Mixed hyperlipidemia: Secondary | ICD-10-CM | POA: Diagnosis not present

## 2016-11-29 DIAGNOSIS — Z9289 Personal history of other medical treatment: Secondary | ICD-10-CM | POA: Diagnosis not present

## 2016-11-29 DIAGNOSIS — I5032 Chronic diastolic (congestive) heart failure: Secondary | ICD-10-CM | POA: Diagnosis not present

## 2016-11-29 DIAGNOSIS — N183 Chronic kidney disease, stage 3 unspecified: Secondary | ICD-10-CM

## 2016-11-29 DIAGNOSIS — I25708 Atherosclerosis of coronary artery bypass graft(s), unspecified, with other forms of angina pectoris: Secondary | ICD-10-CM | POA: Diagnosis not present

## 2016-11-29 DIAGNOSIS — I110 Hypertensive heart disease with heart failure: Secondary | ICD-10-CM

## 2016-11-29 DIAGNOSIS — R600 Localized edema: Secondary | ICD-10-CM | POA: Diagnosis not present

## 2016-11-29 DIAGNOSIS — I313 Pericardial effusion (noninflammatory): Secondary | ICD-10-CM | POA: Diagnosis not present

## 2016-11-29 DIAGNOSIS — I252 Old myocardial infarction: Secondary | ICD-10-CM

## 2016-11-29 DIAGNOSIS — I3139 Other pericardial effusion (noninflammatory): Secondary | ICD-10-CM

## 2016-11-29 MED ORDER — ISOSORBIDE MONONITRATE ER 60 MG PO TB24: 60.0000 mg | ORAL_TABLET | Freq: Every day | ORAL | 3 refills | Status: DC

## 2016-11-29 NOTE — Patient Outreach (Signed)
11/28/16- Red Emmi flag for new or worsening problems, telephone call to pt for follow up, no answer to telephone and no option to leave voicemail.  Jacqlyn Larsen [facility], [place] Coordinator [phone]

## 2016-11-29 NOTE — Patient Instructions (Signed)
Medication Instructions:  Continue all current medications.  Labwork: none  Testing/Procedures: none  Follow-Up: Your physician wants you to follow up in:  3 months.  You will receive a reminder letter in the mail one-two months in advance.  If you don't receive a letter, please call our office to schedule the follow up appointment   Any Other Special Instructions Will Be Listed Below (If Applicable).  If you need a refill on your cardiac medications before your next appointment, please call your pharmacy.

## 2016-11-29 NOTE — Progress Notes (Signed)
SUBJECTIVE: The patient presents for follow-up after recently being hospitalized for acute on chronic diastolic heart failure. Troponins peaked at 0.54. Nuclear stress test in 11/13/16 showed a large area of myocardial scar involving the mid to apical anterior, anteroseptal, septal, and inferoseptal walls. There was only minor peri-infarct ischemia noted.  Echocardiogram 11/09/16: Normal left ventricular systolic function, LVEF 41-32%, regional wall motion abnormalities in the anterior and anteroseptal walls, and a moderate size left-sided pericardial effusion with no evidence of tamponade physiology.  268 pounds today. Weighed 261 pounds on 11/14/16.  With respect to chronic diastolic heart failure, he has been stable on torsemide 40 mg daily. Home weight today was 262 pounds without clothes. He has been avoiding excess sodium consumption. He knows to take an extra diuretic tablet should his weight increase by 3 pounds within 24 hours.  With respect to coronary artery disease and recent non-STEMI, he has been doing well. He denies chest pain.  He does have exertional dyspnea when changing clothes. His PCP has made a referral to pulmonary. He will be undergoing palmar function testing in the near future.  With respect to malignant hypertension, blood pressures have been well controlled at home.  With respect to chronic disease stage III, BUN 48, creatinine 1.89 on 11/27/16.    Wt Readings from Last 3 Encounters:  11/29/16 268 lb (121.6 kg)  11/27/16 270 lb (122.5 kg)  11/14/16 261 lb 3.2 oz (118.5 kg)       Review of Systems: As per "subjective", otherwise negative.  Allergies  Allergen Reactions  . Hydrocodone Nausea And Vomiting  . Lisinopril Cough  . Neurontin [Gabapentin] Other (See Comments)    Reaction:  Suicidal thoughts   . Statins Other (See Comments)    Reaction:  Muscle pain   . Metformin And Related Diarrhea  . Norvasc [Amlodipine Besylate] Swelling and Other  (See Comments)    Reaction:  Pedal edema    Current Outpatient Prescriptions  Medication Sig Dispense Refill  . acetaminophen (TYLENOL) 500 MG tablet Take 1,000 mg by mouth every 6 (six) hours as needed for mild pain, moderate pain, fever or headache.     . ALPRAZolam (XANAX) 0.5 MG tablet TAKE 1/2-1 TABLET BY MOUTH AT BEDTIME AS NEEDED (Patient taking differently: TAKE 1/2-1 TABLET BY MOUTH AT BEDTIME AS NEEDED FOR SLEEP) 30 tablet 3  . aspirin 81 MG chewable tablet Chew 81 mg by mouth at bedtime.    . carboxymethylcellulose (REFRESH PLUS) 0.5 % SOLN Place 1 drop into both eyes 3 (three) times daily as needed.    . clopidogrel (PLAVIX) 75 MG tablet TAKE 1 TABLET AT BEDTIME 90 tablet 1  . colchicine 0.6 MG tablet Take 0.6 mg by mouth 2 (two) times daily as needed (for gout flares).     . fenofibrate 160 MG tablet TAKE 1 TABLET AT BEDTIME 90 tablet 1  . glimepiride (AMARYL) 1 MG tablet Take twice a day with meals (Patient taking differently: Take 1 mg by mouth 2 (two) times daily with a meal. ) 60 tablet 5  . Insulin Detemir (LEVEMIR FLEXPEN) 100 UNIT/ML Pen Inject 60 Units into the skin at bedtime.    . isosorbide mononitrate (IMDUR) 60 MG 24 hr tablet Take 1 tablet (60 mg total) by mouth daily. 30 tablet 0  . metoprolol tartrate (LOPRESSOR) 25 MG tablet Take 25 mg by mouth 2 (two) times daily.    . nitroGLYCERIN (NITROSTAT) 0.4 MG SL tablet Place 1 tablet (0.4  mg total) under the tongue every 5 (five) minutes x 3 doses as needed for chest pain. 25 tablet 2  . torsemide (DEMADEX) 20 MG tablet Take 1 tablet (20 mg total) by mouth daily. (Patient taking differently: Take 40 mg by mouth daily. ) 30 tablet 0  . traMADol (ULTRAM) 50 MG tablet TAKE 1 TABLET BY MOUTH EVERY 6 HOURS AS NEEDED FOR PAIN. 24 tablet 3   No current facility-administered medications for this visit.     Past Medical History:  Diagnosis Date  . Anemia   . Anxiety   . Arteriosclerotic cardiovascular disease (ASCVD)    a.  05/2011 s/p DES to PDA and RCA. b. 08/2012 Inflat  STEMI/Cath/PCI: LM minor irregs, LAD 50p, D1 50, LCX nl, OM1 25, RCA 30-40p, 100d (treated with 2.75x70mm Promus Premier DES);    [name] Asthma   . Bell palsy   . C. difficile colitis    a. 08/2012  . Cholelithiasis 07/2012   Asymptomatic; identified incidentally  . Chronic diastolic CHF (congestive heart failure) ([place])   . Contrast dye induced nephropathy    a. 08/2012 post cath/pci  . COPD (chronic obstructive pulmonary disease) ([place])   . Diabetes mellitus    Peripheral neuropathy  . Gallstones   . GERD (gastroesophageal reflux disease)   . Hyperlipidemia   . Hypertension   . Myocardial infarct 09/08/12  . Nephrolithiasis   . Old myocardial infarction   . Pericardial effusion    a. s/p window in 2014.  [name] PONV (postoperative nausea and vomiting)   . Sleep apnea     Past Surgical History:  Procedure Laterality Date  . CARDIAC CATHETERIZATION    . CHEST TUBE INSERTION    . CIRCUMCISION    . COLONOSCOPY     In [facility], approximately 2011 per patient, was normal. Advised to come back in 10 years.  . ESOPHAGOGASTRODUODENOSCOPY     [place] over 20 yrs ago  . ESOPHAGOGASTRODUODENOSCOPY  06/12/2012   BJS:EGBTDV esophagus-status post Venia Minks dilation. Abnormal gastric mucosa of uncertain significance-status post biopsy  . INTRAOPERATIVE TRANSESOPHAGEAL ECHOCARDIOGRAM N/A 06/23/2013   Procedure: INTRAOPERATIVE TRANSESOPHAGEAL ECHOCARDIOGRAM;  Surgeon: Grace Isaac, MD;  Location: [facility];  Service: Open Heart Surgery;  Laterality: N/A;  . LEFT HEART CATHETERIZATION WITH CORONARY ANGIOGRAM N/A 09/08/2012   Procedure: LEFT HEART CATHETERIZATION WITH CORONARY ANGIOGRAM;  Surgeon: [name], MD;  Location: [facility] CATH LAB;  Service: Cardiovascular;  Laterality: N/A;  . stents    . SUBXYPHOID PERICARDIAL WINDOW N/A 06/23/2013   Procedure: SUBXYPHOID PERICARDIAL WINDOW;  Surgeon: Grace Isaac, MD;  Location: [facility] OR;   Service: Thoracic;  Laterality: N/A;    Social History   Social History  . Marital status: Married    Spouse name: N/A  . Number of children: 2  . Years of education: N/A   Occupational History  . Shipping [employer]   Social History Main Topics  . Smoking status: Never Smoker  . Smokeless tobacco: Never Used  . Alcohol use No     Comment: heavy etoh use 30 years ago  . Drug use: No  . Sexual activity: Yes    Birth control/ protection: None   Other Topics Concern  . 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 Disabled at this point.     Vitals:   11/29/16 1042  BP: 110/70  Pulse: 71  SpO2: 95%  Weight: 268 lb (121.6 kg)  Height: 6\' 1"  (1.854 m)    PHYSICAL EXAM General: NAD HEENT: Normal. Neck: No JVD, no thyromegaly. Lungs: Clear to auscultation bilaterally with normal respiratory effort. CV: Nondisplaced PMI.  Regular rate and rhythm, normal S1/S2, no S3/S4, no murmur. No pretibial or periankle edema.  No carotid bruit.   Abdomen: Soft, nontender, no distention.  Neurologic: Alert and oriented.  Psych: Normal affect. Skin: Normal. Musculoskeletal: No gross deformities.    ECG: Most recent ECG reviewed.      ASSESSMENT AND PLAN: 1. Chronic diastolic heart failure: Currently taking torsemide 40 mg daily. Blood pressure is controlled. He has been instructed to take an extra 20 mg of torsemide should weight increase by 3 pounds within 24 hours.  2. Coronary artery disease: Most recent nuclear stress test detailed above with only minor peri-infarct ischemia. Continue aspirin, Plavix, long-acting nitrates, and metoprolol.  3. Pericardial effusion: Moderate size when last assessed.  4. Hyperlipidemia: Statin intolerant. LDL 118 on 10/16/16. Will consider initiating PCSK- 9 inhibitors.  5. Hypertensive heart disease with heart failure: He has malignant hypertension which is  controlled today. No changes to therapy.  6. CKD stage III: With respect to chronic disease stage III, BUN 48, creatinine 1.89 on 11/27/16.   Disposition: Follow up 3 months.  Time spent: 40 minutes, of which greater than 50% was spent reviewing symptoms, relevant blood tests and studies, and discussing management plan with the patient.   Kate Sable, M.D., F.A.C.C.

## 2016-12-03 ENCOUNTER — Other Ambulatory Visit: Payer: Self-pay | Admitting: *Deleted

## 2016-12-03 NOTE — Patient Outreach (Signed)
[facility] [facility]) Care Management  12/03/2016  [place] 03-02-1956 [id number]  I called Mr. Quesinberry today to follow up on his recent hospital admission for volume overload but was unable to reach him. I left a HIPPA compliant voice message requesting a return call.   Plan: I will call Mr. Lesser by Wednesday.    [place] Management  [phone]

## 2016-12-05 ENCOUNTER — Other Ambulatory Visit: Payer: Self-pay | Admitting: *Deleted

## 2016-12-05 NOTE — Patient Outreach (Signed)
[place] [facility]) Care Management  12/05/2016  Eugene [name] 11-19-1955 [id number]  Follow up with Eugene [name] today for continued transition of care and CHF. He states he feels well and improved from a week ago. He is taking all medications as prescribed and is slowly increasing his activity with walking. He is weighing daily and recording. His weight today was 258lb on his home scale (down from 263lb on his home scale/first day home from the hospital). He denies swelling or shortness of breath.   Plan: I will reach out to Eugene [name] again next week for continued transition of care calls. I offered to visit Eugene [name] at home but he declined stating he would like to maintain our calls until he feels he is "back on track".    [place] Management  [phone]

## 2016-12-06 ENCOUNTER — Other Ambulatory Visit: Payer: Self-pay | Admitting: *Deleted

## 2016-12-06 NOTE — Patient Outreach (Signed)
[facility]) Care Management  12/06/2016  [place] Kyllonen [place] Feb 14, 1956 [id number]  Emmi Red alert received for Mr. Stiverson. I reached out to him by phone and he says that he DOES NOT have increased shortness of breath and that his weight today is within 1 pound of yesterday's weight. He said he thought he might have answered the questions incorrectly when he took the [facility] call.   We reviewed Mr. Baucom's medications in detail as well as his treatment plan. Mr. Legler says he "got the green light" to resume more vigorous exercise when he saw Dr. Bronson Ing at the office last week and he plans to start his water exercise routine at the [facility] next week on Monday. His first week goal is 3 times weekly, starting slowly.   I reviewed signs and symptoms of CHF and the rationale and need to continue daily weights calling for a gain of 3# overnight that does not respond to his allowed additional dose of Torsemide.   Plan: I will reach out to Mr. Freshour by phone next week for continued follow up.    [place] Management  [phone]

## 2016-12-07 ENCOUNTER — Encounter: Payer: Self-pay | Admitting: Family Medicine

## 2016-12-10 ENCOUNTER — Encounter: Payer: Self-pay | Admitting: Family Medicine

## 2016-12-10 ENCOUNTER — Other Ambulatory Visit: Payer: Self-pay | Admitting: *Deleted

## 2016-12-10 NOTE — Patient Outreach (Signed)
[facility]) Care Management  12/10/2016  [place] [birth year] [id number]  I reached out by phone to Mr. Bonsall to follow up on [place] but did not reach him and was not able to leave a voice message.   Plan: I will follow up with Mr. Marcelli by phone tomorrow.    [place] Management  [phone]

## 2016-12-13 ENCOUNTER — Other Ambulatory Visit: Payer: Self-pay | Admitting: *Deleted

## 2016-12-13 NOTE — Patient Outreach (Signed)
[facility]) Care Management  12/13/2016  [place] 05/31/1956 [id number]  I spoke with Mr. Knotts by phone today to follow up on his CHF disease management and transition of care needs.   Mr. Treto denies any shortness of breath, swelling, chest pain, or other new or worsened symptom. He is weighing daily and his weight today was 255 pounds. Mr. Bucklin continues to work on modifying his diet to avoid salty foods and limit his daily sodium intake to 2g. He is also actively working on monitoring his water intake and has stopped drinking as much water particularly in the evenings.   Mr. Nannini is back to the [facility] and is slowly working his way back into his exercise and water program. His goal is to get back to 3-4 days/week.  Mr. Surgeon saw Dr. Bronson Ing in the office in April and is to see Dr. [name] again on 02/19/17.   Plan: I will reach out to Mr. Chesney by phone again next week to complete his transition of care series and will assess his ongoing needs for case management at that time.    [place] Management  [phone]

## 2016-12-14 ENCOUNTER — Encounter (INDEPENDENT_AMBULATORY_CARE_PROVIDER_SITE_OTHER): Payer: Medicare HMO | Admitting: Ophthalmology

## 2016-12-14 DIAGNOSIS — H35033 Hypertensive retinopathy, bilateral: Secondary | ICD-10-CM | POA: Diagnosis not present

## 2016-12-14 DIAGNOSIS — I10 Essential (primary) hypertension: Secondary | ICD-10-CM | POA: Diagnosis not present

## 2016-12-14 DIAGNOSIS — H43811 Vitreous degeneration, right eye: Secondary | ICD-10-CM | POA: Diagnosis not present

## 2016-12-14 DIAGNOSIS — N183 Chronic kidney disease, stage 3 (moderate): Secondary | ICD-10-CM | POA: Diagnosis not present

## 2016-12-14 DIAGNOSIS — E113311 Type 2 diabetes mellitus with moderate nonproliferative diabetic retinopathy with macular edema, right eye: Secondary | ICD-10-CM | POA: Diagnosis not present

## 2016-12-14 DIAGNOSIS — E875 Hyperkalemia: Secondary | ICD-10-CM | POA: Diagnosis not present

## 2016-12-14 DIAGNOSIS — E11311 Type 2 diabetes mellitus with unspecified diabetic retinopathy with macular edema: Secondary | ICD-10-CM | POA: Diagnosis not present

## 2016-12-14 DIAGNOSIS — E669 Obesity, unspecified: Secondary | ICD-10-CM | POA: Diagnosis not present

## 2016-12-14 DIAGNOSIS — R809 Proteinuria, unspecified: Secondary | ICD-10-CM | POA: Diagnosis not present

## 2016-12-17 ENCOUNTER — Ambulatory Visit: Payer: Self-pay | Admitting: *Deleted

## 2016-12-17 ENCOUNTER — Other Ambulatory Visit: Payer: Self-pay | Admitting: Family Medicine

## 2016-12-19 ENCOUNTER — Other Ambulatory Visit: Payer: Self-pay | Admitting: *Deleted

## 2016-12-19 NOTE — Patient Outreach (Signed)
[facility]) Care Management  12/19/2016  [place] [birth year] [id number]   I reached out to Eugene [name] today as part of his transition of care program series and because of two Emmi Red alerts received (one yesterday and one today - notified of both today).   Eugene [name] reports that he feels well and as if his "breathing is finally straightened out." He denies chest pain, shortness of breath, swelling, dizziness, or other new or worsened symptoms.   Eugene [name] is weighing daily. His weight today was 253lb down from 270lb at the beginning of the month. He has made "drastic" changes to his diet and says he knows that he was eating too much starch. He is relying on "lean proteins, lots of vegetables, and getting back to exercise" to help in his weight loss and health maintenance journey. Eugene [name] reports that he has not started going to the [facility] yet but has been building up his walking regimen.   Eugene [name] reports having seen his eye doctor late last week to have an injection and says this went well and he feels his vision is improving. He also says he recently saw his nephrologist who recommended that he lose 60lbs. Eugene [name] says he is confident he can lose the weight. He has gathered literature on his own and from his provider office and says he has been doing a lot of research and reading to help keep him motivated.   Plan: Eugene [name] still does not wish to have home visits but does wish to continue phone calls. I will reach out to Eugene [name] again next week. Eugene [name] will call for new or worsening symptoms or weight gain of 3# overnight or 5# in a week.    [place] Management  [phone]

## 2016-12-20 ENCOUNTER — Ambulatory Visit: Payer: Self-pay | Admitting: *Deleted

## 2016-12-24 ENCOUNTER — Other Ambulatory Visit (HOSPITAL_COMMUNITY): Payer: Self-pay | Admitting: Nephrology

## 2016-12-24 ENCOUNTER — Other Ambulatory Visit: Payer: Self-pay | Admitting: *Deleted

## 2016-12-24 ENCOUNTER — Encounter: Payer: Self-pay | Admitting: *Deleted

## 2016-12-24 DIAGNOSIS — N183 Chronic kidney disease, stage 3 unspecified: Secondary | ICD-10-CM

## 2016-12-24 NOTE — Patient Outreach (Addendum)
[facility]) Care Management  12/24/2016  Eugene [name] 07-Mar-1956 [id number]  Outreach to Eugene [name] after Eugene [name] Red alert for weight gain/swelling. He says he's never sure exactly how to respond to "those phone questions from the computer" but hasn't had any symptoms since he came home from the hospital. His weight today is 253lb (down 1lb from yesterday). He says he does not have swelling or edema or shortness of breath. He has plans to go fishing over the next couple of days with his brother and son and is generally feeling well. Eugene [name] has seen his primary care provider and cardiologist in follow up since his hospitalization. He has met all case management goals.   Plan: Eugene [name] says he does not feel he has further case management needs. He is now 49 days out from his hospital discharge and says he feels "on track". I will close Eugene [name]'s case at his request. He knows how to contact us should new/further needs arise.    THN CM Care Plan Problem One     Most Recent Value  Care Plan Problem One  Knowledge Deficits related to management of CHF  Role Documenting the Problem One  Care Management Coordinator  Care Plan for Problem One  Not Active  THN Long Term Goal (31-90 days)  Over the next 31 days, patient will verbalize understanding of plan of care for management of CHF  THN Long Term Goal Start Date  11/22/16  THN Long Term Goal Met Date  12/24/16  [facility] CM Short Term Goal #1 (0-30 days)  Over the next 30 days, patient will take all medications as prescribed  THN CM Short Term Goal #1 Start Date  11/22/16  [facility] CM Short Term Goal #1 Met Date  12/24/16  Interventions for Short Term Goal #1  medication review/reconciliation  THN CM Short Term Goal #2 (0-30 days)  Over the next 30 days, patient will attend all scheduled provider appointments  THN CM Short Term Goal #2 Start Date  11/22/16  [facility] CM Short Term Goal #2 Met Date  12/24/16   Interventions for Short Term Goal #2  reviewed scheduled provider appointments with patient  THN CM Short Term Goal #3 (0-30 days)  Over the next 30 days, patient will weigh daily and record weight, contacting cardiology office for weight gain of 3# overnight or 5# in one week  THN CM Short Term Goal #3 Start Date  11/22/16  [facility] CM Short Term Goal #3 Met Date  12/24/16  Interventions for Short Tern Goal #3  Reviewed with patient, using teachback method, rationale for and necessity of daily weight and recording/calling in management of CHF  THN CM Short Term Goal #4 (0-30 days)  Over the next 15 days, patient will verbalize understanding of signs and symptoms of CHF for which he should call his provider/report  THN CM Short Term Goal #4 Start Date  12/06/16  [facility] CM Short Term Goal #4 Met Date  12/24/16  Interventions for Short Term Goal #4  utilizing teachback method, reviewed with patient signs and symptoms of CHF     Eugene [name] MHA,BSN,RN,CCM [facility] Care Management  [phone]

## 2016-12-25 ENCOUNTER — Ambulatory Visit: Payer: Self-pay | Admitting: *Deleted

## 2016-12-25 DIAGNOSIS — Z0100 Encounter for examination of eyes and vision without abnormal findings: Secondary | ICD-10-CM | POA: Diagnosis not present

## 2017-01-02 ENCOUNTER — Ambulatory Visit (HOSPITAL_COMMUNITY)
Admission: RE | Admit: 2017-01-02 | Discharge: 2017-01-02 | Disposition: A | Discharge status: Home / Self Care | Payer: Medicare HMO | Source: Ambulatory Visit | Attending: Nephrology | Admitting: Nephrology

## 2017-01-02 DIAGNOSIS — D519 Vitamin B12 deficiency anemia, unspecified: Secondary | ICD-10-CM | POA: Diagnosis not present

## 2017-01-02 DIAGNOSIS — Z1159 Encounter for screening for other viral diseases: Secondary | ICD-10-CM | POA: Diagnosis not present

## 2017-01-02 DIAGNOSIS — R809 Proteinuria, unspecified: Secondary | ICD-10-CM | POA: Diagnosis not present

## 2017-01-02 DIAGNOSIS — N183 Chronic kidney disease, stage 3 unspecified: Secondary | ICD-10-CM

## 2017-01-02 DIAGNOSIS — Z79899 Other long term (current) drug therapy: Secondary | ICD-10-CM | POA: Diagnosis not present

## 2017-01-02 DIAGNOSIS — I10 Essential (primary) hypertension: Secondary | ICD-10-CM | POA: Diagnosis not present

## 2017-01-02 DIAGNOSIS — E559 Vitamin D deficiency, unspecified: Secondary | ICD-10-CM | POA: Diagnosis not present

## 2017-01-02 DIAGNOSIS — D509 Iron deficiency anemia, unspecified: Secondary | ICD-10-CM | POA: Diagnosis not present

## 2017-01-03 ENCOUNTER — Encounter: Payer: Self-pay | Admitting: Family Medicine

## 2017-01-03 LAB — BASIC METABOLIC PANEL
BUN/Creatinine Ratio: 22 — ABNORMAL HIGH (ref 9–20)
BUN: 37 mg/dL — ABNORMAL HIGH (ref 6–24)
CHLORIDE: 100 mmol/L (ref 96–106)
CO2: 23 mmol/L (ref 18–29)
Calcium: 9.4 mg/dL (ref 8.7–10.2)
Creatinine, Ser: 1.66 mg/dL — ABNORMAL HIGH (ref 0.76–1.27)
GFR calc Af Amer: 51 mL/min/{1.73_m2} — ABNORMAL LOW (ref >59)
GFR calc non Af Amer: 44 mL/min/{1.73_m2} — ABNORMAL LOW (ref >59)
GLUCOSE: 119 mg/dL — AB (ref 65–99)
POTASSIUM: 5 mmol/L (ref 3.5–5.2)
SODIUM: 140 mmol/L (ref 134–144)

## 2017-01-04 ENCOUNTER — Telehealth: Payer: Self-pay | Admitting: Family Medicine

## 2017-01-04 NOTE — Telephone Encounter (Signed)
Pt states he had an infection about one and a half months ago got better but never cleared up. Having chills. Cannot lay on his left side. Pt states he still has tramadol and does not need a refill. Pt advised to go to urgent care or ER and not wait til Monday. Pt agreed to go to ED for treatment.

## 2017-01-04 NOTE — Telephone Encounter (Signed)
Patient is requesting something for pain in kidneys .States he cant sleep due to pain.He called his kidney specialist they referred him back to his primary care doctor because his appointment is not until 6/5 and he states cant wait until then due to pain.If you could call in medication that you called in before to [place] in [place]

## 2017-01-04 NOTE — Telephone Encounter (Signed)
Nurse needs to talk with patient. I am not sure what he means by pain in his kidney. Patient may well need to be seen at urgent care if he is having severe trouble. It is fine to refill tramadol 1 3 times a day when necessary pain #24-we can see the patient next week at his scheduled office visit but if he needs to be seen today he needs to go to urgent care

## 2017-01-04 NOTE — Telephone Encounter (Signed)
Tramadol 50 mg, QTY 24  "TAKE 1 TABLET BY MOUTH EVERY 6 HOURS AS NEEDED FOR PAIN." [place], [place]

## 2017-01-07 ENCOUNTER — Encounter (HOSPITAL_COMMUNITY): Payer: Self-pay | Admitting: Emergency Medicine

## 2017-01-07 ENCOUNTER — Emergency Department (HOSPITAL_COMMUNITY)
Admission: EM | Admit: 2017-01-07 | Discharge: 2017-01-07 | Disposition: A | Discharge status: Home / Self Care | Payer: Medicare HMO | Attending: Emergency Medicine | Admitting: Emergency Medicine

## 2017-01-07 DIAGNOSIS — R6884 Jaw pain: Secondary | ICD-10-CM | POA: Diagnosis not present

## 2017-01-07 DIAGNOSIS — R7989 Other specified abnormal findings of blood chemistry: Secondary | ICD-10-CM | POA: Insufficient documentation

## 2017-01-07 DIAGNOSIS — R778 Other specified abnormalities of plasma proteins: Secondary | ICD-10-CM

## 2017-01-07 DIAGNOSIS — E1122 Type 2 diabetes mellitus with diabetic chronic kidney disease: Secondary | ICD-10-CM | POA: Insufficient documentation

## 2017-01-07 DIAGNOSIS — J449 Chronic obstructive pulmonary disease, unspecified: Secondary | ICD-10-CM | POA: Diagnosis not present

## 2017-01-07 DIAGNOSIS — Z79899 Other long term (current) drug therapy: Secondary | ICD-10-CM | POA: Diagnosis not present

## 2017-01-07 DIAGNOSIS — N183 Chronic kidney disease, stage 3 (moderate): Secondary | ICD-10-CM | POA: Diagnosis not present

## 2017-01-07 DIAGNOSIS — J45909 Unspecified asthma, uncomplicated: Secondary | ICD-10-CM | POA: Diagnosis not present

## 2017-01-07 DIAGNOSIS — Z794 Long term (current) use of insulin: Secondary | ICD-10-CM | POA: Diagnosis not present

## 2017-01-07 DIAGNOSIS — Z7982 Long term (current) use of aspirin: Secondary | ICD-10-CM | POA: Diagnosis not present

## 2017-01-07 DIAGNOSIS — I5023 Acute on chronic systolic (congestive) heart failure: Secondary | ICD-10-CM | POA: Insufficient documentation

## 2017-01-07 DIAGNOSIS — I2510 Atherosclerotic heart disease of native coronary artery without angina pectoris: Secondary | ICD-10-CM | POA: Insufficient documentation

## 2017-01-07 DIAGNOSIS — I130 Hypertensive heart and chronic kidney disease with heart failure and stage 1 through stage 4 chronic kidney disease, or unspecified chronic kidney disease: Secondary | ICD-10-CM | POA: Diagnosis not present

## 2017-01-07 LAB — CBC WITH DIFFERENTIAL/PLATELET
Basophils Absolute: 0 10*3/uL (ref 0.0–0.1)
Basophils Relative: 0 %
EOS ABS: 0.2 10*3/uL (ref 0.0–0.7)
EOS PCT: 2 %
HCT: 35.1 % — ABNORMAL LOW (ref 39.0–52.0)
Hemoglobin: 11.7 g/dL — ABNORMAL LOW (ref 13.0–17.0)
LYMPHS PCT: 23 %
Lymphs Abs: 1.6 10*3/uL (ref 0.7–4.0)
MCH: 29.5 pg (ref 26.0–34.0)
MCHC: 33.3 g/dL (ref 30.0–36.0)
MCV: 88.6 fL (ref 78.0–100.0)
Monocytes Absolute: 0.7 10*3/uL (ref 0.1–1.0)
Monocytes Relative: 9 %
Neutro Abs: 4.6 10*3/uL (ref 1.7–7.7)
Neutrophils Relative %: 66 %
PLATELETS: 168 10*3/uL (ref 150–400)
RBC: 3.96 MIL/uL — AB (ref 4.22–5.81)
RDW: 14.4 % (ref 11.5–15.5)
WBC: 7.1 10*3/uL (ref 4.0–10.5)

## 2017-01-07 LAB — URINALYSIS, ROUTINE W REFLEX MICROSCOPIC
BACTERIA UA: NONE SEEN
BILIRUBIN URINE: NEGATIVE
Glucose, UA: NEGATIVE mg/dL
Ketones, ur: NEGATIVE mg/dL
Leukocytes, UA: NEGATIVE
Nitrite: NEGATIVE
PROTEIN: 100 mg/dL — AB
Specific Gravity, Urine: 1.012 (ref 1.005–1.030)
pH: 5 (ref 5.0–8.0)

## 2017-01-07 LAB — COMPREHENSIVE METABOLIC PANEL
ALBUMIN: 3.8 g/dL (ref 3.5–5.0)
ALT: 15 U/L — ABNORMAL LOW (ref 17–63)
ANION GAP: 7 (ref 5–15)
AST: 24 U/L (ref 15–41)
Alkaline Phosphatase: 37 U/L — ABNORMAL LOW (ref 38–126)
BILIRUBIN TOTAL: 0.5 mg/dL (ref 0.3–1.2)
BUN: 32 mg/dL — AB (ref 6–20)
CO2: 25 mmol/L (ref 22–32)
CREATININE: 1.61 mg/dL — AB (ref 0.61–1.24)
Calcium: 9.1 mg/dL (ref 8.9–10.3)
Chloride: 105 mmol/L (ref 101–111)
GFR calc Af Amer: 52 mL/min — ABNORMAL LOW (ref >60)
GFR calc non Af Amer: 45 mL/min — ABNORMAL LOW (ref >60)
Glucose, Bld: 105 mg/dL — ABNORMAL HIGH (ref 65–99)
POTASSIUM: 4.2 mmol/L (ref 3.5–5.1)
Sodium: 137 mmol/L (ref 135–145)
TOTAL PROTEIN: 7.1 g/dL (ref 6.5–8.1)

## 2017-01-07 LAB — I-STAT TROPONIN, ED: Troponin i, poc: 0.24 ng/mL (ref 0.00–0.08)

## 2017-01-07 LAB — TROPONIN I
TROPONIN I: 0.73 ng/mL — AB (ref <0.03)
Troponin I: 0.77 ng/mL (ref <0.03)

## 2017-01-07 MED ORDER — HYDRALAZINE HCL 10 MG PO TABS: 20.0000 mg | ORAL_TABLET | Freq: Two times a day (BID) | ORAL | 0 refills | Status: DC

## 2017-01-07 MED ORDER — ASPIRIN 325 MG PO TABS
325.0000 mg | ORAL_TABLET | Freq: Once | ORAL | Status: AC
  Administered 2017-01-07: 325 mg via ORAL
  Filled 2017-01-07: qty 1

## 2017-01-07 MED ORDER — ACETAMINOPHEN 500 MG PO TABS
1000.0000 mg | ORAL_TABLET | Freq: Once | ORAL | Status: AC
  Administered 2017-01-07: 1000 mg via ORAL
  Filled 2017-01-07: qty 2

## 2017-01-07 MED ORDER — NITROGLYCERIN 2 % TD OINT
1.0000 [in_us] | TOPICAL_OINTMENT | Freq: Once | TRANSDERMAL | Status: AC
  Administered 2017-01-07: 1 [in_us] via TOPICAL
  Filled 2017-01-07: qty 1

## 2017-01-07 NOTE — ED Provider Notes (Addendum)
Patient presents with bilateral jaw pain waking him from sleep. He continues to deny any chest pain at any point. States the jaw pain is improved. He does have some tenderness over the left TMJ. No obvious deformity or masses. Initial troponin was elevated to 0.77. discussed with Dr. Sallyanne Kuster. Thinks the patient can be discharged to follow up if repeat troponin has not increased. Repeat at 3 hours is 0.73. Patient then had a third troponin which is 0.24. Repeat EKG is unchanged. Nonspecific T wave changes. He continues to deny any chest pain. His jaw pain appears to be musculoskeletal in nature. Discussed with Dr. Bronson Ing who reviewed the patient's EKG. Asked to have patient placed on hydralazine 20 mg twice a day. He will arrange to have the patient followed up this week in the office. Patient understands the need to return immediately for any chest pain, difficulty breathing or for any concerns.   Julianne Rice, MD 01/07/17 1132    Julianne Rice, MD 01/07/17 1148

## 2017-01-07 NOTE — ED Notes (Signed)
CRITICAL VALUE ALERT  Critical value received: Troponin 0.77  Date of notification: 01/07/17 Time of notification:  0558 Critical value read back:Yes.    Nurse who received alert: GMP  MD notified (1st page): [place] Time of first page: 0559 Responding MD: [place] Time MD responded: [phone]

## 2017-01-07 NOTE — ED Provider Notes (Signed)
[facility] DEPT Provider Note   CSN: [id number] Arrival date & time: 01/07/17  0447     History   Chief Complaint Chief Complaint  Patient presents with  . Jaw Pain    HPI   HPI Eugene [name] is a 61 y.o. male with medical history significant of recent NSTEMI in March, CHF, pericardial effusion, COPD, DM, Who presents with jaw pain. Patient reports that he woke up this morning with bilateral jaw pain. Patient states that he woke up approximately 2 hours ago. Denies any history of the same. States it's worse with opening his mouth. Denies any recent dental procedures. Denies any upper respiratory symptoms including fevers, cough, shortness of breath, sore throat. Patient currently rates his pain at 9 out of 10. He denies any chest pain or radiation of pain inferiorly.   Past Medical History:  Diagnosis Date  . Anemia   . Anxiety   . Arteriosclerotic cardiovascular disease (ASCVD)    a. 05/2011 s/p DES to PDA and RCA. b. 08/2012 Inflat  STEMI/Cath/PCI: LM minor irregs, LAD 50p, D1 50, LCX nl, OM1 25, RCA 30-40p, 100d (treated with 2.75x33mm Promus Premier DES);    [name] Asthma   . Bell palsy   . C. difficile colitis    a. 08/2012  . Cholelithiasis 07/2012   Asymptomatic; identified incidentally  . Chronic diastolic CHF (congestive heart failure) ([place])   . Contrast dye induced nephropathy    a. 08/2012 post cath/pci  . COPD (chronic obstructive pulmonary disease) ([place])   . Diabetes mellitus    Peripheral neuropathy  . Gallstones   . GERD (gastroesophageal reflux disease)   . Hyperlipidemia   . Hypertension   . Myocardial infarct ([place]) 09/08/12  . Nephrolithiasis   . Old myocardial infarction   . Pericardial effusion    a. s/p window in 2014.  [name] PONV (postoperative nausea and vomiting)   . Sleep apnea     Patient Active Problem List   Diagnosis Date Noted  . DM type 2 causing CKD stage 3 ([place]) 11/14/2016  . Acute on chronic systolic congestive heart failure ([place])    . Cardiomyopathy ([place])   . Acute on chronic diastolic CHF (congestive heart failure) ([place]) 11/10/2016  . Elevated troponin   . Coronary artery disease involving native coronary artery of native heart without angina pectoris   . Moderate nonproliferative diabetic retinopathy of both eyes without macular edema associated with type 2 diabetes mellitus ([place]) 10/22/2016  . Hyperkalemia 06/14/2016  . Non-ST elevation myocardial infarction (NSTEMI) ([place])   . Morbid obesity ([place])   . Diabetes mellitus type 2, uncontrolled, with complications ([place]) 92/06/9416  . Right carotid bruit 11/10/2015  . Pain in the chest   . Severe hypertension 11/09/2015  . Hypertensive urgency 11/09/2015  . Gout of wrist 08/08/2015  . Pericardial effusion without cardiac tamponade 05/26/2015  . HLD (hyperlipidemia) 05/26/2015  . Anxiety 05/26/2015  . Olecranon bursitis   . Inflammatory arthritis   . Elbow joint pain 01/21/2015  . SOB (shortness of breath) on exertion 12/26/2014  . Atypical chest pain 12/25/2014  . Constipation 10/26/2014  . Abdominal pain 10/26/2014  . Peripheral edema 01/29/2014  . Acute on chronic diastolic heart failure ([place]) 01/26/2014  . Acute on chronic renal failure ([place]) 01/26/2014  . Acute kidney injury superimposed on chronic kidney disease ([place]) 12/14/2013  . Chest pain 12/13/2013  . SOB (shortness of breath) 10/16/2013  . Dyspnea 10/15/2013  . Chronic diastolic CHF (congestive heart failure) ([place])  10/15/2013  . NSVT (nonsustained ventricular tachycardia) ([place]) 06/26/2013  . Type 2 diabetes mellitus with neurological manifestations ([place]) 06/26/2013  . Acute diastolic CHF (congestive heart failure) ([place]) 06/26/2013  . Pericardial effusion 06/26/2013  . Old myocardial infarction   . Gastroparesis due to DM ([place]) 05/21/2013  . CAD (coronary artery disease) 09/16/2012  . Acute on chronic renal insufficiency 09/14/2012  . Edema 08/18/2012  . Anemia, normocytic normochromic  07/31/2012  . Cholelithiasis 07/27/2012  . Gastroesophageal reflux disease 05/10/2012  . Noncompliance 05/09/2012  . Peripheral neuropathy 10/24/2011  . Obstructive sleep apnea- on C-pap 08/23/2011  . Hyponatremia 08/23/2011  . CAD- MI/RCA PCI-DES x2 2012, and RCA DES Jan 2014   . Hyperlipidemia   . Cholelithiasis 05/28/2011  . DM type 2 (diabetes mellitus, type 2) ([place]) 05/25/2011  . Essential hypertension 05/25/2011    Past Surgical History:  Procedure Laterality Date  . CARDIAC CATHETERIZATION    . CHEST TUBE INSERTION    . CIRCUMCISION    . COLONOSCOPY     In [facility], approximately 2011 per patient, was normal. Advised to come back in 10 years.  . ESOPHAGOGASTRODUODENOSCOPY     in [place] over 20 yrs ago  . ESOPHAGOGASTRODUODENOSCOPY  06/12/2012   KNL:ZJQBHA esophagus-status post Venia Minks dilation. Abnormal gastric mucosa of uncertain significance-status post biopsy  . INTRAOPERATIVE TRANSESOPHAGEAL ECHOCARDIOGRAM N/A 06/23/2013   Procedure: INTRAOPERATIVE TRANSESOPHAGEAL ECHOCARDIOGRAM;  Surgeon: Grace Isaac, MD;  Location: [facility];  Service: Open Heart Surgery;  Laterality: N/A;  . LEFT HEART CATHETERIZATION WITH CORONARY ANGIOGRAM N/A 09/08/2012   Procedure: LEFT HEART CATHETERIZATION WITH CORONARY ANGIOGRAM;  Surgeon: [name], MD;  Location[facility] LAB;  Service: Cardiovascular;  Laterality: N/A;  . stents    . SUBXYPHOID PERICARDIAL WINDOW N/A 06/23/2013   Procedure: SUBXYPHOID PERICARDIAL WINDOW;  Surgeon: Grace Isaac, MD;  Location: [facility] OR;  Service: Thoracic;  Laterality: N/A;       Home Medications    Prior to Admission medications   Medication Sig Start Date End Date Taking? Authorizing Provider  acetaminophen (TYLENOL) 500 MG tablet Take 1,000 mg by mouth every 6 (six) hours as needed for mild pain, moderate pain, fever or headache.    Yes [provider]  ALPRAZolam (XANAX) 0.5 MG tablet TAKE 1/2-1 TABLET BY MOUTH AT  BEDTIME AS NEEDED Patient taking differently: TAKE 1/2-1 TABLET BY MOUTH AT BEDTIME AS NEEDED FOR SLEEP 10/01/16  Yes [name], MD  aspirin 81 MG chewable tablet Chew 81 mg by mouth at bedtime.   Yes [provider]  carboxymethylcellulose (REFRESH PLUS) 0.5 % SOLN Place 1 drop into both eyes 3 (three) times daily as needed.   Yes [provider]  clopidogrel (PLAVIX) 75 MG tablet TAKE 1 TABLET AT BEDTIME 12/18/16  Yes Luking, Scott A, MD  colchicine 0.6 MG tablet Take 0.6 mg by mouth 2 (two) times daily as needed (for gout flares).    Yes [provider]  fenofibrate 160 MG tablet TAKE 1 TABLET AT BEDTIME 12/18/16  Yes Luking, Scott A, MD  Insulin Detemir (LEVEMIR FLEXPEN) 100 UNIT/ML Pen Inject 60 Units into the skin at bedtime. 11/14/16  Yes Kathie Dike, MD  isosorbide mononitrate (IMDUR) 60 MG 24 hr tablet Take 1 tablet (60 mg total) by mouth daily. 11/29/16  Yes Herminio Commons, MD  metoprolol tartrate (LOPRESSOR) 25 MG tablet TAKE 1 TABLET TWICE DAILY 12/18/16  Yes Luking, Elayne Snare, MD  nitroGLYCERIN (NITROSTAT) 0.4 MG SL  tablet Place 1 tablet (0.4 mg total) under the tongue every 5 (five) minutes x 3 doses as needed for chest pain. 06/15/16  Yes Strader, [place], PA-C  torsemide (DEMADEX) 20 MG tablet Take 1 tablet (20 mg total) by mouth daily. Patient taking differently: Take 40 mg by mouth daily.  11/14/16  Yes Kathie Dike, MD  traMADol (ULTRAM) 50 MG tablet TAKE 1 TABLET BY MOUTH EVERY 6 HOURS AS NEEDED FOR PAIN. 10/10/16  Yes [name], MD  glimepiride (AMARYL) 1 MG tablet Take twice a day with meals Patient taking differently: Take 1 mg by mouth 2 (two) times daily with a meal.  10/22/16   [name], MD    Family History Family History  Problem Relation Age of Onset  . Diabetes Mother   . Heart attack Mother   . Stroke Mother   . Diabetes Sister   . Sleep apnea Sister   . Hypertension Brother   . Diabetes Brother   . Diabetes  Brother   . Hypertension Brother   . Colon cancer Neg Hx   . Liver disease Neg Hx     Social History Social History  Substance Use Topics  . Smoking status: Never Smoker  . Smokeless tobacco: Never Used  . Alcohol use No     Comment: heavy etoh use 30 years ago     Allergies   Hydrocodone; Lisinopril; Neurontin [gabapentin]; Statins; Metformin and related; and Norvasc [amlodipine besylate]   Review of Systems Review of Systems  Constitutional: Negative for fever.  HENT: Negative for dental problem, drooling, facial swelling, sore throat and trouble swallowing.        Jaw pain  Respiratory: Negative for shortness of breath.   Cardiovascular: Negative for chest pain.  Gastrointestinal: Positive for diarrhea. Negative for abdominal pain, nausea and vomiting.  All other systems reviewed and are negative.    Physical Exam Updated Vital Signs BP 140/67   Pulse 65   Temp 98.1 F (36.7 C) (Oral)   Resp 19   Ht 6\' 1"  (1.854 m)   Wt 253 lb (114.8 kg)   SpO2 97%   BMI 33.38 kg/m   Physical Exam  Constitutional: He is oriented to person, place, and time. No distress.  Obese, no acute distress  HENT:  Head: Normocephalic and atraumatic.  No tenderness to palpation to the TMJ, normal alignment of the jaw, no tenderness to palpation along the jawline, no trismus, obvious dental abnormalities, posterior oropharynx moist and clear, normal phonation  Eyes: Pupils are equal, round, and reactive to light.  Neck: Neck supple.  Exam limited secondary to body habitus; however, no obvious asymmetric neck swelling or cervical adenopathy  Cardiovascular: Normal rate, regular rhythm and normal heart sounds.   Pulmonary/Chest: Effort normal. No respiratory distress. He has no wheezes.  Distant breath sounds  Abdominal: Soft. There is no tenderness.  Musculoskeletal: He exhibits edema.  Lymphadenopathy:    He has no cervical adenopathy.  Neurological: He is alert and oriented to  person, place, and time.  Skin: Skin is warm and dry.  Psychiatric: He has a normal mood and affect.  Nursing note and vitals reviewed.    ED Treatments / Results  Labs (all labs ordered are listed, but only abnormal results are displayed) Labs Reviewed  CBC WITH DIFFERENTIAL/PLATELET - Abnormal; Notable for the following:       Result Value   RBC 3.96 (*)    Hemoglobin 11.7 (*)    HCT  35.1 (*)    All other components within normal limits  COMPREHENSIVE METABOLIC PANEL - Abnormal; Notable for the following:    Glucose, Bld 105 (*)    BUN 32 (*)    Creatinine, Ser 1.61 (*)    ALT 15 (*)    Alkaline Phosphatase 37 (*)    GFR calc non Af Amer 45 (*)    GFR calc Af Amer 52 (*)    All other components within normal limits  TROPONIN I - Abnormal; Notable for the following:    Troponin I 0.77 (*)    All other components within normal limits  URINALYSIS, ROUTINE W REFLEX MICROSCOPIC - Abnormal; Notable for the following:    Hgb urine dipstick SMALL (*)    Protein, ur 100 (*)    Squamous Epithelial / LPF 0-5 (*)    All other components within normal limits  TROPONIN I    EKG  EKG Interpretation  Date/Time:  Monday Jan 07 2017 05:16:36 EDT Ventricular Rate:  80 PR Interval:    QRS Duration: 115 QT Interval:  396 QTC Calculation: 457 R Axis:   129 Text Interpretation:  Sinus rhythm Nonspecific intraventricular conduction delay Anteroseptal infarct, old Minimal ST depression, lateral leads Confirmed by Thayer Jew [phone]) on 01/07/2017 5:52:33 AM       Radiology No results found.  Procedures Procedures (including critical care time)  Medications Ordered in ED Medications  acetaminophen (TYLENOL) tablet 1,000 mg (1,000 mg Oral Given 01/07/17 0526)  aspirin tablet 325 mg (325 mg Oral Given 01/07/17 0612)  nitroGLYCERIN (NITROGLYN) 2 % ointment 1 inch (1 inch Topical Given 01/07/17 0612)     Initial Impression / Assessment and Plan / ED Course  I have reviewed the  triage vital signs and the nursing notes.  Pertinent labs & imaging results that were available during my care of the patient were reviewed by me and considered in my medical decision making (see chart for details).  Clinical Course as of Jan 07 746  Mon Jan 07, 2017  5093 Jaw pain improved with Tylenol. Given positive troponin, patient given aspirin. Nitroglycerin ointment applied. Will discuss with cardiology. Difficult to determine whether jaw pain was related to ACS as patient has a complicated cardiac history and has had previously elevated troponins. Will order repeat troponin at 0800.  [CH]    Clinical Course User Index [CH] Asani Deniston, Barbette Hair, MD    Patient presents with isolated jaw pain. Physical exam is unremarkable. Denies chest pain but has extensive cardiac history.  EKG without acute ischemic changes. Troponin is elevated at 0.77. On chart review, patient had an NSTEMI in March. Troponin peaked at approximately 2.5. Repeat troponins in the 0.4 range. Now trending upwards again. He has not had a recent cardiac catheterization. Awaiting cardiology consultation. I have not received a phone call back. Patient is currently pain-free after Tylenol. He was given aspirin and nitroglycerin paste was applied. Anticipate admission for further cardiac workup given symptoms and workup.  Signed out to Dr. Lita Mains.  Final Clinical Impressions(s) / ED Diagnoses   Final diagnoses:  None    New Prescriptions New Prescriptions   No medications on file     Merryl Hacker, MD 01/07/17 [id number]

## 2017-01-07 NOTE — ED Triage Notes (Signed)
Pt also states he had "diarrhea" all night last night.

## 2017-01-07 NOTE — ED Triage Notes (Signed)
Bilateral jaw pain that started 2 hrs ago.

## 2017-01-07 NOTE — Discharge Instructions (Signed)
Dr. Bronson Ing is aware that she will been in the emergency department. His office will call you to schedule appointment to follow-up this week. If you're not heard from him, call to make an appointment. He watches start taking hydralazine 2 times daily to better control your blood pressure. If you develop chest pain, shortness of breath or any concerns, come immediately to the emergency department. He may take Tylenol for your jaw pain. If this remains persistent, follow-up with an ENT specialist.

## 2017-01-07 NOTE — ED Notes (Signed)
O2 pulse oximeter reading low due to poor fit of sensor on finger.

## 2017-01-08 ENCOUNTER — Other Ambulatory Visit: Payer: Self-pay | Admitting: Cardiovascular Disease

## 2017-01-08 NOTE — Telephone Encounter (Signed)
Pt was in ED yesterday - notes indicate that pt was given printed rx for hydralazine 20 mg bid and that would be discussed at upcoming 5/18 appt with Dr Bronson Ing - made pt aware that we could send #90 day supply at office visit - incase any further changes were made - pt voiced understanding

## 2017-01-08 NOTE — Telephone Encounter (Signed)
° ° ° °  1. Which medications need to be refilled? (please list name of each medication and dose if known)  hydrALAZINE (APRESOLINE) 10 MG tablet        2. Which pharmacy/location (including street and city if local pharmacy) is medication to be sent to?    [facility]     3. Do they need a 30 day or 90 day supply?

## 2017-01-11 ENCOUNTER — Encounter: Payer: Self-pay | Admitting: Cardiovascular Disease

## 2017-01-11 ENCOUNTER — Ambulatory Visit (INDEPENDENT_AMBULATORY_CARE_PROVIDER_SITE_OTHER): Payer: Medicare HMO | Admitting: Cardiovascular Disease

## 2017-01-11 VITALS — BP 122/66 | HR 72 | Ht 73.0 in | Wt 257.0 lb

## 2017-01-11 DIAGNOSIS — E782 Mixed hyperlipidemia: Secondary | ICD-10-CM | POA: Diagnosis not present

## 2017-01-11 DIAGNOSIS — N183 Chronic kidney disease, stage 3 unspecified: Secondary | ICD-10-CM

## 2017-01-11 DIAGNOSIS — I5042 Chronic combined systolic (congestive) and diastolic (congestive) heart failure: Secondary | ICD-10-CM | POA: Diagnosis not present

## 2017-01-11 DIAGNOSIS — I252 Old myocardial infarction: Secondary | ICD-10-CM

## 2017-01-11 DIAGNOSIS — I25708 Atherosclerosis of coronary artery bypass graft(s), unspecified, with other forms of angina pectoris: Secondary | ICD-10-CM

## 2017-01-11 DIAGNOSIS — I110 Hypertensive heart disease with heart failure: Secondary | ICD-10-CM | POA: Diagnosis not present

## 2017-01-11 DIAGNOSIS — I313 Pericardial effusion (noninflammatory): Secondary | ICD-10-CM | POA: Diagnosis not present

## 2017-01-11 DIAGNOSIS — R600 Localized edema: Secondary | ICD-10-CM

## 2017-01-11 DIAGNOSIS — I3139 Other pericardial effusion (noninflammatory): Secondary | ICD-10-CM

## 2017-01-11 DIAGNOSIS — Z9289 Personal history of other medical treatment: Secondary | ICD-10-CM | POA: Diagnosis not present

## 2017-01-11 DIAGNOSIS — I248 Other forms of acute ischemic heart disease: Secondary | ICD-10-CM

## 2017-01-11 DIAGNOSIS — I2489 Other forms of acute ischemic heart disease: Secondary | ICD-10-CM

## 2017-01-11 MED ORDER — HYDRALAZINE HCL 10 MG PO TABS: 20.0000 mg | ORAL_TABLET | Freq: Two times a day (BID) | ORAL | 3 refills | Status: DC

## 2017-01-11 NOTE — Patient Instructions (Signed)
Medication Instructions:  Your physician recommends that you continue on your current medications as directed. Please refer to the Current Medication list given to you today.   Labwork: NONE  Testing/Procedures: NONE  Follow-Up: Your physician wants you to follow-up in: September 2018.  You will receive a reminder letter in the mail two months in advance. If you don't receive a letter, please call our office to schedule the follow-up appointment.   Any Other Special Instructions Will Be Listed Below (If Applicable).     If you need a refill on your cardiac medications before your next appointment, please call your pharmacy.

## 2017-01-11 NOTE — Progress Notes (Signed)
SUBJECTIVE: The patient presents for follow-up after being evaluated in the ED on 01/07/17 for bilateral jaw pain which awoke him from sleep. He did not have any chest pain. He had tenderness over the left TMJ.  Troponins were 0.77, 0.73, and 0.24.  I personally reviewed the ECG which showed sinus rhythm with old anteroseptal infarct and nonspecific ST segment and T-wave abnormalities in the inferolateral leads.  Nuclear stress test on 11/13/16 showed a large area of scar in the anterior wall and septum with a trivial region of partial reversibility toward the apex suggesting minor peri-infarct ischemia.  Echocardiogram 11/03/16: Mildly reduced left ventricular systolic function, LVEF 94-76%, mid to apical inferior and inferoseptal hypokinesis, severe LVH, grade 2 diastolic dysfunction, large pericardial effusion noted posteriorly with smaller collection anteriorly. No tamponade physiology.  Follow-up echocardiogram on 3/16 showed normal left ventricular systolic function, LVEF 54-65%, with a moderate left sided pericardial effusion with no evidence of tamponade.  Blood pressure was elevated. I started him on hydralazine 20 mg twice daily. He is already taking aspirin and Plavix.  Coronary angiography on 09/08/12 demonstrated 50% proximal LAD stenosis, 50% D1 stenosis, OM1 with mild 25% stenosis, proximal RCA 30-40% stenosis with previously implanted stent. The distal vessel was 100% occluded prior to PCI.  During this episode, he had no chest pain or shortness of breath whatsoever. He was given Tylenol and a nitroglycerin patch in the ED.   Today he is feeling very well and denies chest pain, jaw pain, palpitations, leg swelling, and shortness of breath.  He weighs 252 pounds by his home scale. He has lost 11 pounds since his last visit with me in April.  Blood pressure is 122/66.   Review of Systems: As per "subjective", otherwise negative.  Allergies  Allergen Reactions  .  Hydrocodone Nausea And Vomiting  . Lisinopril Cough  . Neurontin [Gabapentin] Other (See Comments)    Reaction:  Suicidal thoughts   . Statins Other (See Comments)    Reaction:  Muscle pain   . Metformin And Related Diarrhea  . Norvasc [Amlodipine Besylate] Swelling and Other (See Comments)    Reaction:  Pedal edema    Current Outpatient Prescriptions  Medication Sig Dispense Refill  . acetaminophen (TYLENOL) 500 MG tablet Take 1,000 mg by mouth every 6 (six) hours as needed for mild pain, moderate pain, fever or headache.     . ALPRAZolam (XANAX) 0.5 MG tablet TAKE 1/2-1 TABLET BY MOUTH AT BEDTIME AS NEEDED (Patient taking differently: TAKE 1/2-1 TABLET BY MOUTH AT BEDTIME AS NEEDED FOR SLEEP) 30 tablet 3  . aspirin 81 MG chewable tablet Chew 81 mg by mouth at bedtime.    . carboxymethylcellulose (REFRESH PLUS) 0.5 % SOLN Place 1 drop into both eyes 3 (three) times daily as needed.    . clopidogrel (PLAVIX) 75 MG tablet TAKE 1 TABLET AT BEDTIME 90 tablet 1  . fenofibrate 160 MG tablet TAKE 1 TABLET AT BEDTIME 90 tablet 1  . glimepiride (AMARYL) 1 MG tablet Take twice a day with meals (Patient taking differently: Take 1 mg by mouth 2 (two) times daily with a meal. ) 60 tablet 5  . hydrALAZINE (APRESOLINE) 10 MG tablet Take 2 tablets (20 mg total) by mouth 2 (two) times daily. 60 tablet 0  . Insulin Detemir (LEVEMIR FLEXPEN) 100 UNIT/ML Pen Inject 60 Units into the skin at bedtime.    . isosorbide mononitrate (IMDUR) 60 MG 24 hr tablet Take 1 tablet (60  mg total) by mouth daily. 90 tablet 3  . metoprolol tartrate (LOPRESSOR) 25 MG tablet TAKE 1 TABLET TWICE DAILY 180 tablet 1  . nitroGLYCERIN (NITROSTAT) 0.4 MG SL tablet Place 1 tablet (0.4 mg total) under the tongue every 5 (five) minutes x 3 doses as needed for chest pain. 25 tablet 2  . OVER THE COUNTER MEDICATION Apply 1 application topically daily. Diabetic foot cream    . torsemide (DEMADEX) 20 MG tablet Take 1 tablet (20 mg total) by  mouth daily. 30 tablet 0  . traMADol (ULTRAM) 50 MG tablet TAKE 1 TABLET BY MOUTH EVERY 6 HOURS AS NEEDED FOR PAIN. 24 tablet 3   No current facility-administered medications for this visit.     Past Medical History:  Diagnosis Date  . Anemia   . Anxiety   . Arteriosclerotic cardiovascular disease (ASCVD)    a. 05/2011 s/p DES to PDA and RCA. b. 08/2012 Inflat  STEMI/Cath/PCI: LM minor irregs, LAD 50p, D1 50, LCX nl, OM1 25, RCA 30-40p, 100d (treated with 2.75x72mm Promus Premier DES);    [name] Asthma   . Bell palsy   . C. difficile colitis    a. 08/2012  . Cholelithiasis 07/2012   Asymptomatic; identified incidentally  . Chronic diastolic CHF (congestive heart failure) ([place])   . Contrast dye induced nephropathy    a. 08/2012 post cath/pci  . COPD (chronic obstructive pulmonary disease) ([place])   . Diabetes mellitus    Peripheral neuropathy  . Gallstones   . GERD (gastroesophageal reflux disease)   . Hyperlipidemia   . Hypertension   . Myocardial infarct ([place]) 09/08/12  . Nephrolithiasis   . Old myocardial infarction   . Pericardial effusion    a. s/p window in 2014.  [name] PONV (postoperative nausea and vomiting)   . Sleep apnea     Past Surgical History:  Procedure Laterality Date  . CARDIAC CATHETERIZATION    . CHEST TUBE INSERTION    . CIRCUMCISION    . COLONOSCOPY     In [facility], approximately 2011 per patient, was normal. Advised to come back in 10 years.  . ESOPHAGOGASTRODUODENOSCOPY     in [place] over 20 yrs ago  . ESOPHAGOGASTRODUODENOSCOPY  06/12/2012   VHQ:[id number] esophagus-status post Venia Minks dilation. Abnormal gastric mucosa of uncertain significance-status post biopsy  . INTRAOPERATIVE TRANSESOPHAGEAL ECHOCARDIOGRAM N/A 06/23/2013   Procedure: INTRAOPERATIVE TRANSESOPHAGEAL ECHOCARDIOGRAM;  Surgeon: Grace Isaac, MD;  Location: [place];  Service: Open Heart Surgery;  Laterality: N/A;  . LEFT HEART CATHETERIZATION WITH CORONARY ANGIOGRAM N/A  09/08/2012   Procedure: LEFT HEART CATHETERIZATION WITH CORONARY ANGIOGRAM;  Surgeon: [name], MD;  Location[facility] C[facility] LAB;  Service: Cardiovascular;  Laterality: N/A;  . stents    . SUBXYPHOID PERICARDIAL WINDOW N/A 06/23/2013   Procedure: SUBXYPHOID PERICARDIAL WINDOW;  Surgeon: Grace Isaac, MD;  Location: [facility] OR;  Service: Thoracic;  Laterality: N/A;    Social History   Social History  . Marital status: Married    Spouse name: N/A  . Number of children: 2  . Years of education: N/A   Occupational History  . Shipping [employer]   Social History Main Topics  . Smoking status: Never Smoker  . Smokeless tobacco: Never Used  . Alcohol use No     Comment: heavy etoh use 30 years ago  . Drug use: No  . Sexual activity: Yes    Birth control/ protection: None   Other  Topics Concern  . 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 Disabled at this point.     Vitals:   01/11/17 1115  BP: 122/66  Pulse: 72  SpO2: 92%  Weight: 257 lb (116.6 kg)  Height: 6\' 1"  (1.854 m)    Wt Readings from Last 3 Encounters:  01/11/17 257 lb (116.6 kg)  01/07/17 253 lb (114.8 kg)  12/19/16 253 lb (114.8 kg)     PHYSICAL EXAM General: NAD HEENT: Normal. Neck: No JVD, no thyromegaly. Lungs: Clear to auscultation bilaterally with normal respiratory effort. CV: Nondisplaced PMI.  Regular rate and rhythm, normal S1/S2, no S3/S4, no murmur. No pretibial or periankle edema.  No carotid bruit.   Abdomen: Soft, nontender, no distention.  Neurologic: Alert and oriented.  Psych: Normal affect. Skin: Normal. Musculoskeletal: No gross deformities.    ECG: Most recent ECG reviewed.   Labs: Lab Results  Component Value Date/Time   K 4.2 01/07/2017 05:07 AM   BUN 32 (H) 01/07/2017 05:07 AM   BUN 37 (H) 01/02/2017 10:24 AM   CREATININE 1.61 (H) 01/07/2017 05:07 AM   CREATININE 1.26 10/26/2014 03:29 PM    ALT 15 (L) 01/07/2017 05:07 AM   TSH 1.585 11/02/2016 09:58 PM   TSH 1.780 10/26/2014 03:25 PM   HGB 11.7 (L) 01/07/2017 05:07 AM     Lipids: Lab Results  Component Value Date/Time   LDLCALC 118 (H) 10/16/2016 09:43 AM   CHOL 204 (H) 10/16/2016 09:43 AM   TRIG 274 (H) 10/16/2016 09:43 AM   HDL 31 (L) 10/16/2016 09:43 AM       ASSESSMENT AND PLAN: 1. Chronic combined systolic and diastolic heart failure: Euvolemic on torsemide 20 mg daily. Blood pressure is controlled. I started hydralazine earlier this week. LVEF initially 45-50% on 3/10 and then 50-55% on 3/16.  2. Coronary artery disease with demand ischemia: Symptomatically stable. Nuclear stress test reviewed above in detail. Continue aspirin, Plavix, nitrates, and metoprolol. Most recent nuclear stress test detailed above with only minor peri-infarct ischemia. Continue aspirin, Plavix, long-acting nitrates, and metoprolol. He is statin intolerant.  3. Pericardial effusion: Echocardiogram detailed above. Will monitor. Moderate in size.  4. Hyperlipidemia: Statin intolerant. LDL 118 on 10/16/16. Will consider initiating PCSK-9 inhibitors.  5. Hypertensive heart disease with heart failure: Malignant hypertension is controlled. I recently started hydralazine.  6. CKD stage III: With respect to chronic disease stage III, BUN 32, creatinine 1.61 on 01/07/17.    Disposition: Follow up Sept 2018  Time spent: 40 minutes, of which greater than 50% was spent reviewing symptoms, relevant blood tests and studies, and discussing management plan with the patient.   Kate Sable, M.D., F.A.C.C.

## 2017-01-16 ENCOUNTER — Encounter (INDEPENDENT_AMBULATORY_CARE_PROVIDER_SITE_OTHER): Payer: Medicare HMO | Admitting: Ophthalmology

## 2017-01-18 ENCOUNTER — Other Ambulatory Visit: Payer: Self-pay | Admitting: Family Medicine

## 2017-01-18 NOTE — Telephone Encounter (Signed)
May have this +5 refills 

## 2017-01-28 DIAGNOSIS — E559 Vitamin D deficiency, unspecified: Secondary | ICD-10-CM | POA: Diagnosis not present

## 2017-01-28 DIAGNOSIS — D638 Anemia in other chronic diseases classified elsewhere: Secondary | ICD-10-CM | POA: Diagnosis not present

## 2017-01-28 DIAGNOSIS — N183 Chronic kidney disease, stage 3 (moderate): Secondary | ICD-10-CM | POA: Diagnosis not present

## 2017-01-28 DIAGNOSIS — R809 Proteinuria, unspecified: Secondary | ICD-10-CM | POA: Diagnosis not present

## 2017-02-08 ENCOUNTER — Emergency Department (HOSPITAL_COMMUNITY)
Admission: EM | Admit: 2017-02-08 | Discharge: 2017-02-09 | Disposition: A | Discharge status: Home / Self Care | Payer: Medicare HMO | Attending: Emergency Medicine | Admitting: Emergency Medicine

## 2017-02-08 ENCOUNTER — Encounter (HOSPITAL_COMMUNITY): Payer: Self-pay | Admitting: Emergency Medicine

## 2017-02-08 DIAGNOSIS — I5032 Chronic diastolic (congestive) heart failure: Secondary | ICD-10-CM | POA: Insufficient documentation

## 2017-02-08 DIAGNOSIS — I2510 Atherosclerotic heart disease of native coronary artery without angina pectoris: Secondary | ICD-10-CM | POA: Diagnosis not present

## 2017-02-08 DIAGNOSIS — R05 Cough: Secondary | ICD-10-CM | POA: Diagnosis not present

## 2017-02-08 DIAGNOSIS — Z7982 Long term (current) use of aspirin: Secondary | ICD-10-CM | POA: Insufficient documentation

## 2017-02-08 DIAGNOSIS — E1122 Type 2 diabetes mellitus with diabetic chronic kidney disease: Secondary | ICD-10-CM | POA: Insufficient documentation

## 2017-02-08 DIAGNOSIS — J349 Unspecified disorder of nose and nasal sinuses: Secondary | ICD-10-CM | POA: Insufficient documentation

## 2017-02-08 DIAGNOSIS — I130 Hypertensive heart and chronic kidney disease with heart failure and stage 1 through stage 4 chronic kidney disease, or unspecified chronic kidney disease: Secondary | ICD-10-CM | POA: Insufficient documentation

## 2017-02-08 DIAGNOSIS — R0981 Nasal congestion: Secondary | ICD-10-CM | POA: Insufficient documentation

## 2017-02-08 DIAGNOSIS — Z79899 Other long term (current) drug therapy: Secondary | ICD-10-CM | POA: Insufficient documentation

## 2017-02-08 DIAGNOSIS — J449 Chronic obstructive pulmonary disease, unspecified: Secondary | ICD-10-CM | POA: Diagnosis not present

## 2017-02-08 DIAGNOSIS — R51 Headache: Secondary | ICD-10-CM | POA: Diagnosis not present

## 2017-02-08 DIAGNOSIS — J029 Acute pharyngitis, unspecified: Secondary | ICD-10-CM

## 2017-02-08 DIAGNOSIS — J3489 Other specified disorders of nose and nasal sinuses: Secondary | ICD-10-CM | POA: Diagnosis not present

## 2017-02-08 DIAGNOSIS — J45909 Unspecified asthma, uncomplicated: Secondary | ICD-10-CM | POA: Insufficient documentation

## 2017-02-08 DIAGNOSIS — Z794 Long term (current) use of insulin: Secondary | ICD-10-CM | POA: Insufficient documentation

## 2017-02-08 DIAGNOSIS — N183 Chronic kidney disease, stage 3 (moderate): Secondary | ICD-10-CM | POA: Insufficient documentation

## 2017-02-08 LAB — RAPID STREP SCREEN (MED CTR MEBANE ONLY): STREPTOCOCCUS, GROUP A SCREEN (DIRECT): NEGATIVE

## 2017-02-08 MED ORDER — PENICILLIN G BENZATHINE 1200000 UNIT/2ML IM SUSP
1.2000 10*6.[IU] | Freq: Once | INTRAMUSCULAR | Status: AC
  Administered 2017-02-09: 1.2 10*6.[IU] via INTRAMUSCULAR
  Filled 2017-02-08: qty 2

## 2017-02-08 NOTE — ED Provider Notes (Signed)
[facility] DEPT Provider Note   CSN: [id number] Arrival date & time: 02/08/17  2233     History   Chief Complaint Chief Complaint  Patient presents with  . Sore Throat    HPI Eugene [name] is a 61 y.o. male.  HPI   Eugene [name] is a 61 y.o. male who presents to the Emergency Department complaining of sinus pressure, nasal congestion, cough and sore throat.  Symptoms present for 2-3 days.  Began with sinus pressure and facial pain.  He has been taking OTC allergy medication without relief.  Cough has been worse with lying down and occasionally productive.  He also complains of decreased appetite.  He denies chest pain, shortness of breath, fever, chills, myalgias, abd pain or vomiting.    Past Medical History:  Diagnosis Date  . Anemia   . Anxiety   . Arteriosclerotic cardiovascular disease (ASCVD)    a. 05/2011 s/p DES to PDA and RCA. b. 08/2012 Inflat  STEMI/Cath/PCI: LM minor irregs, LAD 50p, D1 50, LCX nl, OM1 25, RCA 30-40p, 100d (treated with 2.75x58mm Promus Premier DES);    [name] Asthma   . Bell palsy   . C. difficile colitis    a. 08/2012  . Cholelithiasis 07/2012   Asymptomatic; identified incidentally  . Chronic diastolic CHF (congestive heart failure) ([place])   . Contrast dye induced nephropathy    a. 08/2012 post cath/pci  . COPD (chronic obstructive pulmonary disease) ([place])   . Diabetes mellitus    Peripheral neuropathy  . Gallstones   . GERD (gastroesophageal reflux disease)   . Hyperlipidemia   . Hypertension   . Myocardial infarct ([place]) 09/08/12  . Nephrolithiasis   . Old myocardial infarction   . Pericardial effusion    a. s/p window in 2014.  [name] PONV (postoperative nausea and vomiting)   . Sleep apnea     Patient Active Problem List   Diagnosis Date Noted  . DM type 2 causing CKD stage 3 ([place]) 11/14/2016  . Acute on chronic systolic congestive heart failure ([place])   . Cardiomyopathy ([place])   . Acute on chronic diastolic CHF (congestive  heart failure) ([place]) 11/10/2016  . Elevated troponin   . Coronary artery disease involving native coronary artery of native heart without angina pectoris   . Moderate nonproliferative diabetic retinopathy of both eyes without macular edema associated with type 2 diabetes mellitus ([place]) 10/22/2016  . Hyperkalemia 06/14/2016  . Non-ST elevation myocardial infarction (NSTEMI) ([place])   . Morbid obesity ([place])   . Diabetes mellitus type 2, uncontrolled, with complications ([place]) 41/32/4401  . Right carotid bruit 11/10/2015  . Pain in the chest   . Severe hypertension 11/09/2015  . Hypertensive urgency 11/09/2015  . Gout of wrist 08/08/2015  . Pericardial effusion without cardiac tamponade 05/26/2015  . HLD (hyperlipidemia) 05/26/2015  . Anxiety 05/26/2015  . Olecranon bursitis   . Inflammatory arthritis   . Elbow joint pain 01/21/2015  . SOB (shortness of breath) on exertion 12/26/2014  . Atypical chest pain 12/25/2014  . Constipation 10/26/2014  . Abdominal pain 10/26/2014  . Peripheral edema 01/29/2014  . Acute on chronic diastolic heart failure ([place]) 01/26/2014  . Acute on chronic renal failure ([place]) 01/26/2014  . Acute kidney injury superimposed on chronic kidney disease ([place]) 12/14/2013  . Chest pain 12/13/2013  . SOB (shortness of breath) 10/16/2013  . Dyspnea 10/15/2013  . Chronic diastolic CHF (congestive heart failure) ([place]) 10/15/2013  . NSVT (nonsustained ventricular tachycardia) ([place]) 06/26/2013  .  Type 2 diabetes mellitus with neurological manifestations ([place]) 06/26/2013  . Acute diastolic CHF (congestive heart failure) ([place]) 06/26/2013  . Pericardial effusion 06/26/2013  . Old myocardial infarction   . Gastroparesis due to DM ([place]) 05/21/2013  . CAD (coronary artery disease) 09/16/2012  . Acute on chronic renal insufficiency 09/14/2012  . Edema 08/18/2012  . Anemia, normocytic normochromic 07/31/2012  . Cholelithiasis 07/27/2012  . Gastroesophageal reflux disease  05/10/2012  . Noncompliance 05/09/2012  . Peripheral neuropathy 10/24/2011  . Obstructive sleep apnea- on C-pap 08/23/2011  . Hyponatremia 08/23/2011  . CAD- MI/RCA PCI-DES x2 2012, and RCA DES Jan 2014   . Hyperlipidemia   . Cholelithiasis 05/28/2011  . DM type 2 (diabetes mellitus, type 2) ([place]) 05/25/2011  . Essential hypertension 05/25/2011    Past Surgical History:  Procedure Laterality Date  . CARDIAC CATHETERIZATION    . CHEST TUBE INSERTION    . CIRCUMCISION    . COLONOSCOPY     In [facility], approximately 2011 per patient, was normal. Advised to come back in 10 years.  . ESOPHAGOGASTRODUODENOSCOPY     [place] 20 yrs ago  . ESOPHAGOGASTRODUODENOSCOPY  06/12/2012   [id number] esophagus-status post Venia Minks dilation. Abnormal gastric mucosa of uncertain significance-status post biopsy  . INTRAOPERATIVE TRANSESOPHAGEAL ECHOCARDIOGRAM N/A 06/23/2013   Procedure: INTRAOPERATIVE TRANSESOPHAGEAL ECHOCARDIOGRAM;  Surgeon: Grace Isaac, MD;  Location: [facility];  Service: Open Heart Surgery;  Laterality: N/A;  . LEFT HEART CATHETERIZATION WITH CORONARY ANGIOGRAM N/A 09/08/2012   Procedure: LEFT HEART CATHETERIZATION WITH CORONARY ANGIOGRAM;  Surgeon: [name], MD;  Location: [facility] CATH LAB;  Service: Cardiovascular;  Laterality: N/A;  . stents    . SUBXYPHOID PERICARDIAL WINDOW N/A 06/23/2013   Procedure: SUBXYPHOID PERICARDIAL WINDOW;  Surgeon: Grace Isaac, MD;  Location: [facility] OR;  Service: Thoracic;  Laterality: N/A;       Home Medications    Prior to Admission medications   Medication Sig Start Date End Date Taking? Authorizing Provider  acetaminophen (TYLENOL) 500 MG tablet Take 1,000 mg by mouth every 6 (six) hours as needed for mild pain, moderate pain, fever or headache.     [provider]  ALPRAZolam Duanne Moron) 0.5 MG tablet TAKE 1/2-1 TABLET BY MOUTH AT BEDTIME AS NEEDED 01/18/17   [name], MD  aspirin 81 MG chewable tablet  Chew 81 mg by mouth at bedtime.    [provider]  carboxymethylcellulose (REFRESH PLUS) 0.5 % SOLN Place 1 drop into both eyes 3 (three) times daily as needed.    [provider]  clopidogrel (PLAVIX) 75 MG tablet TAKE 1 TABLET AT BEDTIME 12/18/16   [name], MD  fenofibrate 160 MG tablet TAKE 1 TABLET AT BEDTIME 12/18/16   Luking, Elayne Snare, MD  glimepiride (AMARYL) 1 MG tablet Take twice a day with meals Patient taking differently: Take 1 mg by mouth 2 (two) times daily with a meal.  10/22/16   Luking, Elayne Snare, MD  hydrALAZINE (APRESOLINE) 10 MG tablet Take 2 tablets (20 mg total) by mouth 2 (two) times daily. 01/11/17   Herminio Commons, MD  Insulin Detemir (LEVEMIR FLEXPEN) 100 UNIT/ML Pen Inject 60 Units into the skin at bedtime. 11/14/16   Kathie Dike, MD  isosorbide mononitrate (IMDUR) 60 MG 24 hr tablet Take 1 tablet (60 mg total) by mouth daily. 11/29/16   Herminio Commons, MD  metoprolol tartrate (LOPRESSOR) 25 MG tablet TAKE 1 TABLET TWICE DAILY 12/18/16   Luking,  Elayne Snare, MD  nitroGLYCERIN (NITROSTAT) 0.4 MG SL tablet Place 1 tablet (0.4 mg total) under the tongue every 5 (five) minutes x 3 doses as needed for chest pain. 06/15/16   Strader, Fransisco Hertz, PA-C  OVER THE COUNTER MEDICATION Apply 1 application topically daily. Diabetic foot cream 11/06/16   [provider]  torsemide (DEMADEX) 20 MG tablet Take 1 tablet (20 mg total) by mouth daily. 11/14/16   Kathie Dike, MD  traMADol (ULTRAM) 50 MG tablet TAKE 1 TABLET BY MOUTH EVERY 6 HOURS AS NEEDED FOR PAIN. 10/10/16   [name], MD    Family History Family History  Problem Relation Age of Onset  . Diabetes Mother   . Heart attack Mother   . Stroke Mother   . Diabetes Sister   . Sleep apnea Sister   . Hypertension Brother   . Diabetes Brother   . Diabetes Brother   . Hypertension Brother   . Colon cancer Neg Hx   . Liver disease Neg Hx     Social History Social History    Substance Use Topics  . Smoking status: Never Smoker  . Smokeless tobacco: Never Used  . Alcohol use No     Comment: heavy etoh use 30 years ago     Allergies   Hydrocodone; Lisinopril; Neurontin [gabapentin]; Statins; Metformin and related; and Norvasc [amlodipine besylate]   Review of Systems Review of Systems  Constitutional: Positive for appetite change. Negative for activity change, chills and fever.  HENT: Positive for congestion, rhinorrhea, sinus pain, sinus pressure and sore throat. Negative for facial swelling and trouble swallowing.   Eyes: Negative for visual disturbance.  Respiratory: Positive for cough. Negative for shortness of breath, wheezing and stridor.   Cardiovascular: Negative for chest pain.  Gastrointestinal: Negative for abdominal pain, nausea and vomiting.  Genitourinary: Negative for dysuria.  Musculoskeletal: Negative for neck pain and neck stiffness.  Skin: Negative.  Negative for rash.  Neurological: Positive for headaches. Negative for dizziness, syncope, weakness and numbness.  Hematological: Negative for adenopathy.  Psychiatric/Behavioral: Negative for confusion.  All other systems reviewed and are negative.    Physical Exam Updated Vital Signs BP (!) 181/86   Pulse 92   Temp 98.6 F (37 C)   Resp 18   Ht 6\' 1"  (1.854 m)   Wt 114.3 kg (252 lb)   SpO2 95%   BMI 33.25 kg/m   Physical Exam  Constitutional: He is oriented to person, place, and time. He appears well-developed and well-nourished. No distress.  HENT:  Head: Normocephalic and atraumatic.  Right Ear: Tympanic membrane and ear canal normal.  Left Ear: Tympanic membrane and ear canal normal.  Nose: Mucosal edema and rhinorrhea present. Right sinus exhibits maxillary sinus tenderness and frontal sinus tenderness. Left sinus exhibits maxillary sinus tenderness and frontal sinus tenderness.  Mouth/Throat: Uvula is midline and mucous membranes are normal. No trismus in the jaw.  No uvula swelling. Posterior oropharyngeal edema and posterior oropharyngeal erythema present. No oropharyngeal exudate or tonsillar abscesses.  Eyes: Conjunctivae are normal.  Neck: Normal range of motion and phonation normal. Neck supple. No Kernig's sign noted.  Cardiovascular: Normal rate, regular rhythm and normal heart sounds.   Pulmonary/Chest: Effort normal and breath sounds normal. No respiratory distress. He has no wheezes. He has no rales.  Abdominal: Soft. He exhibits no distension. There is no tenderness. There is no rebound and no guarding.  Musculoskeletal: Normal range of motion. He exhibits no edema.  Lymphadenopathy:  He has no cervical adenopathy.  Neurological: He is alert and oriented to person, place, and time. He exhibits normal muscle tone. Coordination normal.  Skin: Skin is warm and dry.  Psychiatric: He has a normal mood and affect.  Nursing note and vitals reviewed.    ED Treatments / Results  Labs (all labs ordered are listed, but only abnormal results are displayed) Labs Reviewed  RAPID STREP SCREEN (NOT AT [facility])    EKG  EKG Interpretation None       Radiology No results found.  Procedures Procedures (including critical care time)  Medications Ordered in ED Medications  penicillin g benzathine (BICILLIN LA) 1200000 UNIT/2ML injection 1.2 Million Units (1.2 Million Units Intramuscular Given 02/09/17 0018)     Initial Impression / Assessment and Plan / ED Course  I have reviewed the triage vital signs and the nursing notes.  Pertinent labs & imaging results that were available during my care of the patient were reviewed by me and considered in my medical decision making (see chart for details).     Pt non-toxic appearing.  Vitals stable.  Airway patent.  No concerning sx's for PTA or pharyngeal abscess.   Final Clinical Impressions(s) / ED Diagnoses   Final diagnoses:  Sore throat  Sinus pressure    New Prescriptions New  Prescriptions   No medications on file     Bufford Lope 02/09/17 1346    Milton Ferguson, MD 02/09/17 2350

## 2017-02-08 NOTE — ED Triage Notes (Signed)
Pt c/o sore throat, headache, and head congestion x 2 days.

## 2017-02-09 MED ORDER — AZITHROMYCIN 250 MG PO TABS: ORAL_TABLET | ORAL | 0 refills | Status: DC

## 2017-02-09 MED ORDER — MAGIC MOUTHWASH W/LIDOCAINE: 5.0000 mL | Freq: Three times a day (TID) | ORAL | 0 refills | Status: DC | PRN

## 2017-02-09 NOTE — Discharge Instructions (Signed)
Drink plenty of fluids.  Tylenol or ibuprofen if needed for fever.  Follow-up with your doctor for recheck.  Start the zithromax in 2 days if the symptoms are not improving

## 2017-02-11 ENCOUNTER — Encounter (INDEPENDENT_AMBULATORY_CARE_PROVIDER_SITE_OTHER): Payer: Medicare HMO | Admitting: Ophthalmology

## 2017-02-11 ENCOUNTER — Other Ambulatory Visit: Payer: Self-pay | Admitting: Family Medicine

## 2017-02-11 LAB — CULTURE, GROUP A STREP (THRC)

## 2017-02-11 NOTE — Telephone Encounter (Signed)
May have 3 refills 

## 2017-02-18 ENCOUNTER — Encounter (INDEPENDENT_AMBULATORY_CARE_PROVIDER_SITE_OTHER): Payer: Medicare HMO | Admitting: Ophthalmology

## 2017-02-18 DIAGNOSIS — D3132 Benign neoplasm of left choroid: Secondary | ICD-10-CM

## 2017-02-18 DIAGNOSIS — E11311 Type 2 diabetes mellitus with unspecified diabetic retinopathy with macular edema: Secondary | ICD-10-CM

## 2017-02-18 DIAGNOSIS — E113311 Type 2 diabetes mellitus with moderate nonproliferative diabetic retinopathy with macular edema, right eye: Secondary | ICD-10-CM | POA: Diagnosis not present

## 2017-02-18 DIAGNOSIS — H35033 Hypertensive retinopathy, bilateral: Secondary | ICD-10-CM

## 2017-02-18 DIAGNOSIS — I10 Essential (primary) hypertension: Secondary | ICD-10-CM | POA: Diagnosis not present

## 2017-02-18 DIAGNOSIS — E113512 Type 2 diabetes mellitus with proliferative diabetic retinopathy with macular edema, left eye: Secondary | ICD-10-CM | POA: Diagnosis not present

## 2017-02-19 ENCOUNTER — Ambulatory Visit: Payer: Medicare HMO | Admitting: Family Medicine

## 2017-02-22 ENCOUNTER — Telehealth: Payer: Self-pay

## 2017-02-22 DIAGNOSIS — I3139 Other pericardial effusion (noninflammatory): Secondary | ICD-10-CM

## 2017-02-22 DIAGNOSIS — I313 Pericardial effusion (noninflammatory): Secondary | ICD-10-CM

## 2017-02-22 NOTE — Telephone Encounter (Signed)
Per Dr Bronson Ing, needs repeat limited echo for pericardial effusion.order placed, pt prefers apt after 1 pm    Apt scheduled for 02/26/17 at 2 pm, arrive at 1:30 pm pt aware

## 2017-02-22 NOTE — Telephone Encounter (Signed)
Pre-cert Verification for the following procedure   Limited Echo scheduled for 02/26/17 at [facility]

## 2017-02-22 NOTE — Telephone Encounter (Signed)
-----   Message from Bernita Raisin, RN sent at 11/12/2016 11:23 AM EDT ----- Regarding: 3 months repeat limited echo Repeat limited echo 3 mons

## 2017-02-26 ENCOUNTER — Ambulatory Visit (HOSPITAL_COMMUNITY)
Admission: RE | Admit: 2017-02-26 | Discharge: 2017-02-26 | Disposition: A | Discharge status: Home / Self Care | Payer: Medicare HMO | Source: Ambulatory Visit | Attending: Cardiovascular Disease | Admitting: Cardiovascular Disease

## 2017-02-26 DIAGNOSIS — I340 Nonrheumatic mitral (valve) insufficiency: Secondary | ICD-10-CM | POA: Diagnosis not present

## 2017-02-26 DIAGNOSIS — I509 Heart failure, unspecified: Secondary | ICD-10-CM | POA: Insufficient documentation

## 2017-02-26 DIAGNOSIS — E785 Hyperlipidemia, unspecified: Secondary | ICD-10-CM | POA: Diagnosis not present

## 2017-02-26 DIAGNOSIS — E119 Type 2 diabetes mellitus without complications: Secondary | ICD-10-CM | POA: Diagnosis not present

## 2017-02-26 DIAGNOSIS — I2510 Atherosclerotic heart disease of native coronary artery without angina pectoris: Secondary | ICD-10-CM | POA: Insufficient documentation

## 2017-02-26 DIAGNOSIS — I252 Old myocardial infarction: Secondary | ICD-10-CM | POA: Diagnosis not present

## 2017-02-26 DIAGNOSIS — I313 Pericardial effusion (noninflammatory): Secondary | ICD-10-CM | POA: Diagnosis not present

## 2017-02-26 DIAGNOSIS — I110 Hypertensive heart disease with heart failure: Secondary | ICD-10-CM | POA: Insufficient documentation

## 2017-02-26 DIAGNOSIS — I3139 Other pericardial effusion (noninflammatory): Secondary | ICD-10-CM

## 2017-02-26 NOTE — Progress Notes (Signed)
*  PRELIMINARY RESULTS* Echocardiogram 2D Echocardiogram limited has been performed.  Leavy Cella 02/26/2017, 2:54 PM

## 2017-02-28 ENCOUNTER — Telehealth: Payer: Self-pay | Admitting: *Deleted

## 2017-02-28 ENCOUNTER — Telehealth: Payer: Self-pay | Admitting: Cardiovascular Disease

## 2017-02-28 DIAGNOSIS — I3139 Other pericardial effusion (noninflammatory): Secondary | ICD-10-CM

## 2017-02-28 DIAGNOSIS — I313 Pericardial effusion (noninflammatory): Secondary | ICD-10-CM

## 2017-02-28 NOTE — Telephone Encounter (Signed)
Notes recorded by Laurine Blazer, LPN on 08/29/6436 at 3:77 AM EDT Patient notified. Copy to pmd. Order put in for Limited Echo & fwd to [facility] for scheduling. ------  Notes recorded by Herminio Commons, MD on 02/26/2017 at 3:29 PM EDT Collection of fluid around the heart remains stable. Repeat a LIMITED ECHO in 3 months to reassess. This one was a complete echo.

## 2017-02-28 NOTE — Telephone Encounter (Signed)
Pre-cert Verification for the following procedure   Limited echo set for 05/2017

## 2017-03-01 ENCOUNTER — Other Ambulatory Visit: Payer: Self-pay | Admitting: Pharmacist

## 2017-03-01 ENCOUNTER — Ambulatory Visit: Payer: Medicare HMO | Admitting: Family Medicine

## 2017-03-01 NOTE — Patient Outreach (Signed)
Receive a voicemail from [name] requesting a call back for medication assistance with his Levemir Called and spoke with patient. HIPAA identifiers verified and verbal consent received.  [name] reports that he has entered the coverage gap of his insurance and is having difficulty with affording his Levemir. Note that we have assisted [name] with applying for patient assistance for Levemir through the manufacturer in the past. [name] reports that he currently has two of his insulin pens remaining. Reports that his copayment is $148.70 and he is unable to afford it. Patient reports that he feels like he will have to go without his insulin.   [name] reports that he and his wife have separated. Review with patient the eligibility requirements for the Levemir patient assistance program. [name] reports that he does meet the income requirement, but has thus far only spent $647 of the required $1000 out of pocket for the calendar year. Note that the application requires a denial letter for the extra help application. Complete this extra help application with [name] over the phone now.  Set designer to request use of the emergency pharmacy fund to cover a 30 day supply of Levemir for this patient. Patient qualified for a 1 month pharmacy emergency supply of his Levemir based on approval received from [facility] due to financial need.   Let [name] know that he was approved to receive a one time 30 day supply fill of his Levemir covered by the pharmacy emergency fund. Patient expresses appreciation of the help that he has received from [place] [employer].    [name] reports that he has no further questions for me at this time.  PLAN:  1) [name] to pick up his Levemir prescription from [place]  2) [name] to retain his response from [place] regarding the extra help application  when he receives this in the mail.  3) If have not heard back from [name] in 1 month, will follow up with him again at that time.  Harlow Asa, PharmD, [place] Management [phone]

## 2017-03-05 ENCOUNTER — Ambulatory Visit (INDEPENDENT_AMBULATORY_CARE_PROVIDER_SITE_OTHER): Payer: Medicare HMO | Admitting: Family Medicine

## 2017-03-05 ENCOUNTER — Ambulatory Visit: Payer: Medicare HMO | Admitting: Cardiovascular Disease

## 2017-03-05 ENCOUNTER — Encounter: Payer: Self-pay | Admitting: Family Medicine

## 2017-03-05 VITALS — BP 136/84 | Ht 73.0 in | Wt 257.0 lb

## 2017-03-05 DIAGNOSIS — N183 Chronic kidney disease, stage 3 (moderate): Secondary | ICD-10-CM | POA: Diagnosis not present

## 2017-03-05 DIAGNOSIS — K219 Gastro-esophageal reflux disease without esophagitis: Secondary | ICD-10-CM

## 2017-03-05 DIAGNOSIS — E119 Type 2 diabetes mellitus without complications: Secondary | ICD-10-CM | POA: Diagnosis not present

## 2017-03-05 DIAGNOSIS — I5032 Chronic diastolic (congestive) heart failure: Secondary | ICD-10-CM | POA: Diagnosis not present

## 2017-03-05 DIAGNOSIS — Z79899 Other long term (current) drug therapy: Secondary | ICD-10-CM | POA: Diagnosis not present

## 2017-03-05 DIAGNOSIS — Z1159 Encounter for screening for other viral diseases: Secondary | ICD-10-CM | POA: Diagnosis not present

## 2017-03-05 DIAGNOSIS — E784 Other hyperlipidemia: Secondary | ICD-10-CM

## 2017-03-05 DIAGNOSIS — I10 Essential (primary) hypertension: Secondary | ICD-10-CM | POA: Diagnosis not present

## 2017-03-05 DIAGNOSIS — E7849 Other hyperlipidemia: Secondary | ICD-10-CM

## 2017-03-05 LAB — POCT GLYCOSYLATED HEMOGLOBIN (HGB A1C): HEMOGLOBIN A1C: 7.7

## 2017-03-05 MED ORDER — RANITIDINE HCL 300 MG PO TABS: 300.0000 mg | ORAL_TABLET | Freq: Every day | ORAL | 3 refills | Status: DC

## 2017-03-05 NOTE — Progress Notes (Signed)
   Subjective:    Patient ID: Eugene [name], male    DOB: 16-Nov-1955, 61 y.o.   MRN: [id number]  Diabetes  He presents for his follow-up diabetic visit. He has type 2 diabetes mellitus. Pertinent negatives for hypoglycemia include no confusion. Pertinent negatives for diabetes include no chest pain, no fatigue, no polydipsia, no polyphagia and no weakness. Risk factors for coronary artery disease include diabetes mellitus, dyslipidemia, hypertension and obesity. Current diabetic treatment includes insulin injections and oral agent (dual therapy). He is compliant with treatment all of the time. His weight is stable. He is following a diabetic diet.   He denies a low sugar spells Takes his blood pressure medicine regular basis feels like it's doing fairly good Watches his diet try to keep cholesterol down States intermittent reflux symptoms of burning in the throat typically improves if he puts his mattress on a six-inch blocks he is thinking about doing this Has history of heart failure but it's been under good control recently.   Review of Systems  Constitutional: Negative for activity change, appetite change and fatigue.  HENT: Negative for congestion.   Respiratory: Negative for cough.   Cardiovascular: Negative for chest pain.  Gastrointestinal: Negative for abdominal pain.  Endocrine: Negative for polydipsia and polyphagia.  Neurological: Negative for weakness.  Psychiatric/Behavioral: Negative for confusion.       Objective:   Physical Exam  Constitutional: He appears well-nourished. No distress.  Cardiovascular: Normal rate, regular rhythm and normal heart sounds.   No murmur heard. Pulmonary/Chest: Effort normal and breath sounds normal. No respiratory distress.  Musculoskeletal: He exhibits no edema.  Lymphadenopathy:    He has no cervical adenopathy.  Neurological: He is alert.  Psychiatric: His behavior is normal.  Vitals reviewed.   No sinus CHF under good  control no fluid overload lungs clear extremities no swelling      Assessment & Plan:  Diabetes fair control would like to see the A1c at 7. Patient will start recording glucose readings couple times a day he will send [place] the readings in a few weeks time several times a day he holds off on the glimepiride  Blood pressure good control continue current measures  Hyperlipidemia previous labs reviewed Labs ordered await the results  Hepatitis C screening recommended  Heart disease stable no sign of CHF currently on today's exam  Patient does have moderate reflux related symptoms-Zantac recommended see if he can keep his symptoms under decent control with this. Follow-up if progressive troubles

## 2017-03-08 DIAGNOSIS — E784 Other hyperlipidemia: Secondary | ICD-10-CM | POA: Diagnosis not present

## 2017-03-08 DIAGNOSIS — Z1159 Encounter for screening for other viral diseases: Secondary | ICD-10-CM | POA: Diagnosis not present

## 2017-03-09 LAB — LIPID PANEL
CHOL/HDL RATIO: 5.7 ratio — AB (ref 0.0–5.0)
Cholesterol, Total: 194 mg/dL (ref 100–199)
HDL: 34 mg/dL — ABNORMAL LOW (ref >39)
LDL CALC: 102 mg/dL — AB (ref 0–99)
Triglycerides: 288 mg/dL — ABNORMAL HIGH (ref 0–149)
VLDL Cholesterol Cal: 58 mg/dL — ABNORMAL HIGH (ref 5–40)

## 2017-03-09 LAB — HEPATITIS C ANTIBODY

## 2017-03-11 ENCOUNTER — Other Ambulatory Visit: Payer: Self-pay

## 2017-03-11 MED ORDER — EZETIMIBE 10 MG PO TABS: 10.0000 mg | ORAL_TABLET | Freq: Every day | ORAL | 1 refills | Status: DC

## 2017-03-19 DIAGNOSIS — E875 Hyperkalemia: Secondary | ICD-10-CM | POA: Diagnosis not present

## 2017-04-03 ENCOUNTER — Encounter (INDEPENDENT_AMBULATORY_CARE_PROVIDER_SITE_OTHER): Payer: Medicare HMO | Admitting: Ophthalmology

## 2017-04-05 ENCOUNTER — Other Ambulatory Visit: Payer: Self-pay | Admitting: Pharmacist

## 2017-04-05 NOTE — Patient Outreach (Signed)
Call to follow up with [name] regarding medication assistance with his Levemir. Called and spoke with patient. HIPAA identifiers verified and verbal consent received.  Eugene [name] reports that he has 1.5 pens of his Levemir left. States that he is unable to afford his copayment and that he has already charged the maximum amount that he can at his local pharmacy. Note that we are currently working on applying for the Levemir patient assistance program with the patient. Patient reports that he believes that he has currently spent about $700 out of pocket now this year.  Let patient know that I will contact Assistant Clinical Director of [place] to request use of the emergency pharmacy fund to cover a 30 day supply of Levemir for this patient and then I will call him back  Harlow Asa, PharmD, [place] Management [phone]

## 2017-04-05 NOTE — Patient Outreach (Signed)
Call Mr. Nolting back regarding medication assistance for Levemir. HIPAA identifiers verified and verbal consent received.  Let Mr. Gasbarro know that I contacted Assistant Clinical Director of Pharmacy Services to request use of the emergency pharmacy fund to cover a 30 day supply of Levemir for this patient. Patient qualified for a 1 month pharmacy emergency supply of his Levemir based on approval received from [facility] due to financial need.   Let Mr. Mancia know that he was approved to receive a one time 30 day supply fill of his Levemir covered by the pharmacy emergency fund. Patient expresses appreciation of the help that he has received from [place] [employer].   Mr. Rabalais confirms that he has received and will retain his denial letter for extra help from Social Security. Also reports that Dr. [name] has already provided him with a letter that he will need for his assistance application, stating that he does need Levemir pens, rather than vials.   Mr. Utz reports that he has no further questions for me at this time.  PLAN:  1) Will mail Mr. Ramseyer the [employer] assistance application.  2) Mr. Lenker to pick up his Levemir prescription from [place] on Monday  3) If have not heard back from Mr. Poyer in 1 month, will follow up with him again at that time.  Harlow Asa, PharmD, [place] Management [phone]

## 2017-04-05 NOTE — Patient Outreach (Signed)
Call and speak with Eugene [name] at [facility]. Caityln reports that Mr. Scully has spent [phone] out of pocket thus far for the calendar year. Reports that his copayment for his Levemir for this month is $245.74.  Harlow Asa, PharmD, [place] [facility] [phone]

## 2017-04-10 ENCOUNTER — Other Ambulatory Visit: Payer: Self-pay | Admitting: Pharmacy Technician

## 2017-04-10 NOTE — Patient Outreach (Signed)
  [facility]) Care Management  04/10/2017  [place] February 22, 1956 [id number]  I attempted to contact patient in reference to [employer] patient assistance application for Levemir. I completed all of the information that I had available for the patient and will mail out the application for the patient to complete the rest of the application. I left him a voicemail to inform him to be on the lookout for the application. I also faxed the physician portion to [facility] [phone]) to have him complete.  Doreene Burke, [place] [phone]

## 2017-04-24 ENCOUNTER — Encounter (INDEPENDENT_AMBULATORY_CARE_PROVIDER_SITE_OTHER): Payer: Medicare HMO | Admitting: Ophthalmology

## 2017-04-24 DIAGNOSIS — E113311 Type 2 diabetes mellitus with moderate nonproliferative diabetic retinopathy with macular edema, right eye: Secondary | ICD-10-CM | POA: Diagnosis not present

## 2017-04-24 DIAGNOSIS — H35033 Hypertensive retinopathy, bilateral: Secondary | ICD-10-CM | POA: Diagnosis not present

## 2017-04-24 DIAGNOSIS — E113512 Type 2 diabetes mellitus with proliferative diabetic retinopathy with macular edema, left eye: Secondary | ICD-10-CM

## 2017-04-24 DIAGNOSIS — D3132 Benign neoplasm of left choroid: Secondary | ICD-10-CM | POA: Diagnosis not present

## 2017-04-24 DIAGNOSIS — E11311 Type 2 diabetes mellitus with unspecified diabetic retinopathy with macular edema: Secondary | ICD-10-CM | POA: Diagnosis not present

## 2017-04-24 DIAGNOSIS — H43813 Vitreous degeneration, bilateral: Secondary | ICD-10-CM | POA: Diagnosis not present

## 2017-04-24 DIAGNOSIS — I10 Essential (primary) hypertension: Secondary | ICD-10-CM

## 2017-04-25 ENCOUNTER — Other Ambulatory Visit: Payer: Self-pay | Admitting: Pharmacy Technician

## 2017-04-25 ENCOUNTER — Ambulatory Visit: Payer: Medicare HMO | Admitting: Cardiovascular Disease

## 2017-04-25 NOTE — Patient Outreach (Signed)
[facility]) Care Management  04/25/2017  [place] Austad Wessels [birth year] [id number]  I contacted patient today to follow-up on patient assistance application that I mailed on 04-10-17 for Levemir. Mr. Kerins states that his current out of pocket spend is $901 and he is waiting to refill his medication's this month to get to the amount required for patient assistance approval ($1000). The patient will mail his completed application and documentation afterwards. I verified that he has my contact information if he needs my assistance.  Doreene Burke, [place] [phone]

## 2017-05-02 ENCOUNTER — Other Ambulatory Visit: Payer: Self-pay | Admitting: Family Medicine

## 2017-05-06 ENCOUNTER — Other Ambulatory Visit: Payer: Self-pay | Admitting: Pharmacist

## 2017-05-06 ENCOUNTER — Other Ambulatory Visit: Payer: Self-pay | Admitting: Family Medicine

## 2017-05-06 ENCOUNTER — Ambulatory Visit: Payer: Self-pay | Admitting: Pharmacist

## 2017-05-06 DIAGNOSIS — N183 Chronic kidney disease, stage 3 (moderate): Secondary | ICD-10-CM | POA: Diagnosis not present

## 2017-05-06 DIAGNOSIS — D509 Iron deficiency anemia, unspecified: Secondary | ICD-10-CM | POA: Diagnosis not present

## 2017-05-06 DIAGNOSIS — I10 Essential (primary) hypertension: Secondary | ICD-10-CM | POA: Diagnosis not present

## 2017-05-06 DIAGNOSIS — R809 Proteinuria, unspecified: Secondary | ICD-10-CM | POA: Diagnosis not present

## 2017-05-06 DIAGNOSIS — E559 Vitamin D deficiency, unspecified: Secondary | ICD-10-CM | POA: Diagnosis not present

## 2017-05-06 DIAGNOSIS — Z79899 Other long term (current) drug therapy: Secondary | ICD-10-CM | POA: Diagnosis not present

## 2017-05-06 NOTE — Patient Outreach (Signed)
Receive a call from [name]. HIPAA identifiers verified and verbal consent received.  [name] reports that he has 2 pens of his Levemir left. States that he is unable to afford his copayment. Note that we are currently working on applying for the Levemir patient assistance program with the patient. Patient reports that he has currently spent about $903 out of pocket now this year.  Contact patient's pharmacy, [facility], to determine the cost of the medication for this month. Pharmacist reports that the patient is currently out of refills, but that the pharmacy will fax for a new prescription.  Call [name] to let him know and request that he follow up with his pharmacy and call me back when he has a new prescription for his Levemir at the pharmacy.  Harlow Asa, PharmD, [place] Management [phone]

## 2017-05-08 ENCOUNTER — Other Ambulatory Visit: Payer: Self-pay | Admitting: Pharmacist

## 2017-05-08 NOTE — Patient Outreach (Addendum)
Receive a voicemail from [place] letting me know that his pharmacy received a new prescription for his Levemir Flextouch pens. Called and spoke with patient. HIPAA identifiers verified and verbal consent received.  Mr. Klasen reports that he has 2 pens of his Levemir left. States that he is unable to afford his copayment. Note that we are currently working on applying for the Levemir patient assistance program with the patient. Patient requests assistance with the cost of 5 pens, as patient believes that this will be sufficient to last him until he is approved for the   Contact patient's pharmacy, [facility], to determine the cost of the medication for this month. Note that with this copayment, patient will meet the out-of-pocket requirement for the Levemir patient assistance program.   Let Mr. Malak know that I contacted Assistant Clinical Director of Pharmacy Services to request use of the emergency pharmacy fund to cover a 30 day supply of Levemir for this patient. Patient qualified for a 1 month pharmacy emergency supply of his Levemir based on approval received from [facility] due to financial need. Patient expresses appreciation of the help that he has received from [place] [employer].   Patient reports that he will mail his application along with an up to date pharmacy medical expense summary and other supporting documents, back to [place] today.  Patient states that he will go to pick up the Levemir from his pharmacy today. Patient confirms that he has a sufficient supply of the rest of his medications to last him through approaching storm.  Harlow Asa, PharmD, [place] Management [phone]

## 2017-05-13 ENCOUNTER — Ambulatory Visit (INDEPENDENT_AMBULATORY_CARE_PROVIDER_SITE_OTHER): Payer: Medicare HMO | Admitting: Cardiovascular Disease

## 2017-05-13 ENCOUNTER — Encounter: Payer: Self-pay | Admitting: Cardiovascular Disease

## 2017-05-13 VITALS — BP 160/81 | HR 64 | Ht 73.0 in | Wt 250.0 lb

## 2017-05-13 DIAGNOSIS — I313 Pericardial effusion (noninflammatory): Secondary | ICD-10-CM

## 2017-05-13 DIAGNOSIS — I252 Old myocardial infarction: Secondary | ICD-10-CM | POA: Diagnosis not present

## 2017-05-13 DIAGNOSIS — I25708 Atherosclerosis of coronary artery bypass graft(s), unspecified, with other forms of angina pectoris: Secondary | ICD-10-CM | POA: Diagnosis not present

## 2017-05-13 DIAGNOSIS — E782 Mixed hyperlipidemia: Secondary | ICD-10-CM | POA: Diagnosis not present

## 2017-05-13 DIAGNOSIS — I110 Hypertensive heart disease with heart failure: Secondary | ICD-10-CM

## 2017-05-13 DIAGNOSIS — I5042 Chronic combined systolic (congestive) and diastolic (congestive) heart failure: Secondary | ICD-10-CM

## 2017-05-13 DIAGNOSIS — I3139 Other pericardial effusion (noninflammatory): Secondary | ICD-10-CM

## 2017-05-13 NOTE — Patient Instructions (Signed)

## 2017-05-13 NOTE — Progress Notes (Signed)
SUBJECTIVE: The patient presents for routine follow-up.  Echocardiogram 02/26/17 demonstrated mildly reduced left ventricular systolic function, LVEF 82%, diffuse hypokinesis, mild LVH, restrictive diastolic physiology, mild mitral regurgitation, mild left atrial dilatation, and a moderate size pericardial effusion without tamponade physiology.  Nuclear stress test on 11/13/16 showed a large area of scar in the anterior wall and septum with a trivial region of partial reversibility toward the apex suggesting minor peri-infarct ischemia.  Coronary angiography on 09/08/12 demonstrated 50% proximal LAD stenosis, 50% D1 stenosis, OM1 with mild 25% stenosis, proximal RCA 30-40% stenosis with previously implanted stent. The distal vessel was 100% occluded prior to PCI.  He denies chest pain. He weighs himself daily. He was 249 pounds on his home scale today.  Last week he had a 5 pound weight gain and noticed he had shortness of breath. He took 40 mg of torsemide for one day and got back down to his dry weight.  Blood pressures at home range 136-140/79-86.  He is exercising but wants to exercise more at the [facility]. He tries to eat a lot of vegetables.   Review of Systems: As per "subjective", otherwise negative.  Allergies  Allergen Reactions  . Hydrocodone Nausea And Vomiting  . Lisinopril Cough  . Neurontin [Gabapentin] Other (See Comments)    Reaction:  Suicidal thoughts   . Statins Other (See Comments)    Reaction:  Muscle pain   . Metformin And Related Diarrhea  . Norvasc [Amlodipine Besylate] Swelling and Other (See Comments)    Reaction:  Pedal edema    Current Outpatient Prescriptions  Medication Sig Dispense Refill  . acetaminophen (TYLENOL) 500 MG tablet Take 1,000 mg by mouth every 6 (six) hours as needed for mild pain, moderate pain, fever or headache.     . ALPRAZolam (XANAX) 0.5 MG tablet TAKE 1/2-1 TABLET BY MOUTH AT BEDTIME AS NEEDED 30 tablet 5  . aspirin 81 MG  chewable tablet Chew 81 mg by mouth at bedtime.    . clopidogrel (PLAVIX) 75 MG tablet TAKE 1 TABLET AT BEDTIME 90 tablet 1  . DiphenhydrAMINE HCl (ALLERGY MED PO) Take 1 tablet by mouth once as needed (for allergies).    . ezetimibe (ZETIA) 10 MG tablet Take 1 tablet (10 mg total) by mouth daily. 90 tablet 1  . fenofibrate 160 MG tablet TAKE 1 TABLET AT BEDTIME 90 tablet 1  . glimepiride (AMARYL) 1 MG tablet Take twice a day with meals (Patient taking differently: Take 1 mg by mouth 2 (two) times daily with a meal. ) 60 tablet 5  . hydrALAZINE (APRESOLINE) 10 MG tablet Take 2 tablets (20 mg total) by mouth 2 (two) times daily. 180 tablet 3  . Insulin Pen Needle (BD PEN NEEDLE NANO U/F) 32G X 4 MM MISC Use once daily as directed 100 each 0  . isosorbide mononitrate (IMDUR) 60 MG 24 hr tablet Take 1 tablet (60 mg total) by mouth daily. 90 tablet 3  . LEVEMIR FLEXTOUCH 100 UNIT/ML Pen INJECT 80 UNITS INTO THE SKIN DAILY AT 10 PM. 30 mL 4  . losartan (COZAAR) 25 MG tablet Take 12.5 mg by mouth daily.     . magic mouthwash w/lidocaine SOLN Take 5 mLs by mouth 3 (three) times daily as needed for mouth pain. Swish and spit, do not swallow 100 mL 0  . metoprolol tartrate (LOPRESSOR) 25 MG tablet TAKE 1 TABLET TWICE DAILY 180 tablet 1  . nitroGLYCERIN (NITROSTAT) 0.4 MG SL tablet  Place 1 tablet (0.4 mg total) under the tongue every 5 (five) minutes x 3 doses as needed for chest pain. 25 tablet 2  . OVER THE COUNTER MEDICATION Apply 1 application topically daily. Diabetic foot cream    . ranitidine (ZANTAC) 300 MG tablet Take 1 tablet (300 mg total) by mouth at bedtime. 90 tablet 3  . torsemide (DEMADEX) 20 MG tablet Take 1 tablet (20 mg total) by mouth daily. 30 tablet 0  . traMADol (ULTRAM) 50 MG tablet TAKE 1 TABLET BY MOUTH EVERY 6 HOURS AS NEEDED FOR PAIN. 24 tablet 3   No current facility-administered medications for this visit.     Past Medical History:  Diagnosis Date  . Anemia   . Anxiety     . Arteriosclerotic cardiovascular disease (ASCVD)    a. 05/2011 s/p DES to PDA and RCA. b. 08/2012 Inflat  STEMI/Cath/PCI: LM minor irregs, LAD 50p, D1 50, LCX nl, OM1 25, RCA 30-40p, 100d (treated with 2.75x49mm Promus Premier DES);    [name] Asthma   . Bell palsy   . C. difficile colitis    a. 08/2012  . Cholelithiasis 07/2012   Asymptomatic; identified incidentally  . Chronic diastolic CHF (congestive heart failure) ([place])   . Contrast dye induced nephropathy    a. 08/2012 post cath/pci  . COPD (chronic obstructive pulmonary disease) ([place])   . Diabetes mellitus    Peripheral neuropathy  . Gallstones   . GERD (gastroesophageal reflux disease)   . Hyperlipidemia   . Hypertension   . Myocardial infarct ([place]) 09/08/12  . Nephrolithiasis   . Old myocardial infarction   . Pericardial effusion    a. s/p window in 2014.  [name] PONV (postoperative nausea and vomiting)   . Sleep apnea     Past Surgical History:  Procedure Laterality Date  . CARDIAC CATHETERIZATION    . CHEST TUBE INSERTION    . CIRCUMCISION    . COLONOSCOPY     In [facility], approximately 2011 per patient, was normal. Advised to come back in 10 years.  . ESOPHAGOGASTRODUODENOSCOPY     [place] 20 yrs ago  . ESOPHAGOGASTRODUODENOSCOPY  06/12/2012   FVC:BSWHQP esophagus-status post Venia Minks dilation. Abnormal gastric mucosa of uncertain significance-status post biopsy  . INTRAOPERATIVE TRANSESOPHAGEAL ECHOCARDIOGRAM N/A 06/23/2013   Procedure: INTRAOPERATIVE TRANSESOPHAGEAL ECHOCARDIOGRAM;  Surgeon: Grace Isaac, MD;  Location: [place];  Service: Open Heart Surgery;  Laterality: N/A;  . LEFT HEART CATHETERIZATION WITH CORONARY ANGIOGRAM N/A 09/08/2012   Procedure: LEFT HEART CATHETERIZATION WITH CORONARY ANGIOGRAM;  Surgeon: [name], MD;  Location: [facility] CATH LAB;  Service: Cardiovascular;  Laterality: N/A;  . stents    . SUBXYPHOID PERICARDIAL WINDOW N/A 06/23/2013   Procedure: SUBXYPHOID  PERICARDIAL WINDOW;  Surgeon: Grace Isaac, MD;  Location: [facility] OR;  Service: Thoracic;  Laterality: N/A;    Social History   Social History  . Marital status: Married    Spouse name: N/A  . Number of children: 2  . Years of education: N/A   Occupational History  . Shipping [employer]   Social History Main Topics  . Smoking status: Never Smoker  . Smokeless tobacco: Never Used  . Alcohol use No     Comment: heavy etoh use 30 years ago  . Drug use: No  . Sexual activity: Yes    Birth control/ protection: None   Other Topics Concern  . 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 Disabled at this point.     Vitals:   05/13/17 1355  BP: (!) 160/81  Pulse: 64  SpO2: 97%  Weight: 250 lb (113.4 kg)  Height: 6\' 1"  (1.854 m)    Wt Readings from Last 3 Encounters:  05/13/17 250 lb (113.4 kg)  03/05/17 257 lb (116.6 kg)  02/08/17 252 lb (114.3 kg)     PHYSICAL EXAM General: NAD HEENT: Normal. Neck: No JVD, no thyromegaly. Lungs: Clear to auscultation bilaterally with normal respiratory effort. CV: Nondisplaced PMI.  Regular rate and rhythm, normal S1/S2, no S3/S4, no murmur. No pretibial or periankle edema.  No carotid bruit.   Abdomen: Soft, nontender, no distention.  Neurologic: Alert and oriented.  Psych: Normal affect. Skin: Normal. Musculoskeletal: No gross deformities.    ECG: Most recent ECG reviewed.   Labs: Lab Results  Component Value Date/Time   K 4.2 01/07/2017 05:07 AM   BUN 32 (H) 01/07/2017 05:07 AM   BUN 37 (H) 01/02/2017 10:24 AM   CREATININE 1.61 (H) 01/07/2017 05:07 AM   CREATININE 1.26 10/26/2014 03:29 PM   ALT 15 (L) 01/07/2017 05:07 AM   TSH 1.585 11/02/2016 09:58 PM   TSH 1.780 10/26/2014 03:25 PM   HGB 11.7 (L) 01/07/2017 05:07 AM   HGB 11.2 (L) 11/27/2016 02:10 PM     Lipids: Lab Results  Component Value Date/Time   LDLCALC 102 (H) 03/08/2017  09:32 AM   CHOL 194 03/08/2017 09:32 AM   TRIG 288 (H) 03/08/2017 09:32 AM   HDL 34 (L) 03/08/2017 09:32 AM       ASSESSMENT AND PLAN:  1. Chronic combined systolic and diastolic heart failure: Euvolemic on torsemide 20 mg daily. Blood pressure is controlled. Echo from July 2018 reviewed above.  2. Coronary artery disease with demand ischemia: Symptomatically stable. Nuclear stress test reviewed above in detail. Continue aspirin, Plavix, nitrates, and metoprolol. Most recent nuclear stress test detailed above with only minor peri-infarct ischemia.  He is statin intolerant.  3. Pericardial effusion: Echocardiogram detailed above. Will monitor with another echo next month. Moderate in size.  4. Hyperlipidemia: Statin intolerant. LDL 102 on 03/08/17. He is now on Zetia. I would consider PCSK-9 inhibitors.  5. Hypertensive heart disease with heart failure: BP is elevated today but is reasonably controlled at home. A blood pressure of 130/80 should be targeted.    Disposition: Follow up 6 months.   Kate Sable, M.D., F.A.C.C.

## 2017-05-17 ENCOUNTER — Other Ambulatory Visit: Payer: Self-pay | Admitting: Pharmacy Technician

## 2017-05-17 NOTE — Patient Outreach (Signed)
[facility]) Care Management  05/17/2017  [place] 07/26/1956 [id number]  I contacted Mr. Nipp today in reference to the patient assistance application for [place] that was mailed to him 04-10-2017. When I last contacted the patient (04/25/17) he had not spent enough out of pocket to qualify for the program. The patient informed me today that he has now spent enough and he has mailed the completed application along with required documentation that is needed. I will submit his application once I receive it and follow-up with patient once application has been processed by [employer].  Doreene Burke, [place] [phone]

## 2017-05-20 ENCOUNTER — Other Ambulatory Visit: Payer: Self-pay | Admitting: Pharmacy Technician

## 2017-05-20 ENCOUNTER — Other Ambulatory Visit: Payer: Self-pay | Admitting: Family Medicine

## 2017-05-20 NOTE — Telephone Encounter (Signed)
Last seen 03/05/17

## 2017-05-20 NOTE — Patient Outreach (Signed)
[facility] [facility]) Care Management  05/20/2017  [place] 22-Aug-1956 [id number]  I contacted patient today to inform him that I have received his completed application and would fax it to [employer] today. I will follow-up with the patient once the application is processed by [employer].  Doreene Burke, [place] [phone]

## 2017-05-20 NOTE — Telephone Encounter (Signed)
This +3 refills 

## 2017-05-31 ENCOUNTER — Other Ambulatory Visit: Payer: Self-pay | Admitting: Pharmacy Technician

## 2017-05-31 ENCOUNTER — Other Ambulatory Visit: Payer: Self-pay | Admitting: Pharmacist

## 2017-05-31 NOTE — Patient Outreach (Signed)
[facility]) Care Management  05/31/2017  [place] 14-Dec-1955 [id number]  I called Mr. Froman today in reference to his patient assistance application for Levemir that was submitted for approval. HIPAA identifiers were verified and verbal consent received. Mr. Luhn has been approved through the 07/26/2017 and his medication will be shipped to the provider's office. The patient was notified and I will removed myself from his care team.  Doreene Burke, [place] [phone]

## 2017-05-31 NOTE — Patient Outreach (Signed)
Call to [employer] Patient assistance program regarding the status of patient's application for assistance for Levemir insulin. Speak with program representative who confirms that all of the patient's paperwork has been received, reviews his application and approves the patient for the patient assistance program. Representative states that the medication will be shipped and to Dr. Lance Sell office in 7-10 business days.   PLAN:  1) Will notify Pharmacy Technician Doreene Burke, who will call to notify patient.  2) Will send closure letter to patient and patient's PCP.  3) Will close pharmacy episode.   Harlow Asa, PharmD, [place] Management [phone]

## 2017-06-04 ENCOUNTER — Other Ambulatory Visit: Payer: Self-pay | Admitting: Family Medicine

## 2017-06-05 DIAGNOSIS — R809 Proteinuria, unspecified: Secondary | ICD-10-CM | POA: Diagnosis not present

## 2017-06-05 DIAGNOSIS — N183 Chronic kidney disease, stage 3 (moderate): Secondary | ICD-10-CM | POA: Diagnosis not present

## 2017-06-05 DIAGNOSIS — E559 Vitamin D deficiency, unspecified: Secondary | ICD-10-CM | POA: Diagnosis not present

## 2017-06-05 DIAGNOSIS — E875 Hyperkalemia: Secondary | ICD-10-CM | POA: Diagnosis not present

## 2017-06-06 ENCOUNTER — Encounter (INDEPENDENT_AMBULATORY_CARE_PROVIDER_SITE_OTHER): Payer: Medicare HMO | Admitting: Ophthalmology

## 2017-06-12 ENCOUNTER — Other Ambulatory Visit: Payer: Self-pay | Admitting: Pharmacy Technician

## 2017-06-12 ENCOUNTER — Ambulatory Visit (INDEPENDENT_AMBULATORY_CARE_PROVIDER_SITE_OTHER): Payer: Medicare HMO

## 2017-06-12 ENCOUNTER — Other Ambulatory Visit: Payer: Self-pay

## 2017-06-12 DIAGNOSIS — I313 Pericardial effusion (noninflammatory): Secondary | ICD-10-CM | POA: Diagnosis not present

## 2017-06-12 DIAGNOSIS — I3139 Other pericardial effusion (noninflammatory): Secondary | ICD-10-CM

## 2017-06-12 NOTE — Patient Outreach (Signed)
[facility]) Care Management  06/12/2017  [place] Marucci [place] 23-May-1956 [id number]   Contacted Mr. Saulter in reference to [place] and pen needles approved for patient assistance. HIPAA identifiers verified and verbal consent received. The patient's application was approved on 05/31/2017 and I called today to verify whether or not the medication has been received. The patient states that he picked medication up today from provider's office.  Doreene Burke, [place] [phone]

## 2017-06-13 ENCOUNTER — Telehealth: Payer: Self-pay | Admitting: *Deleted

## 2017-06-13 NOTE — Telephone Encounter (Signed)
Notes recorded by Laurine Blazer, LPN on 90/93/1121 at 12:02 PM EDT Patient notified. Copy to pmd. ------  Notes recorded by Herminio Commons, MD on 06/13/2017 at 7:44 AM EDT Fluid collection around the heart is stable. Pumping function is normal.

## 2017-06-24 ENCOUNTER — Encounter (INDEPENDENT_AMBULATORY_CARE_PROVIDER_SITE_OTHER): Payer: Medicare HMO | Admitting: Ophthalmology

## 2017-06-24 DIAGNOSIS — H35033 Hypertensive retinopathy, bilateral: Secondary | ICD-10-CM

## 2017-06-24 DIAGNOSIS — E113311 Type 2 diabetes mellitus with moderate nonproliferative diabetic retinopathy with macular edema, right eye: Secondary | ICD-10-CM | POA: Diagnosis not present

## 2017-06-24 DIAGNOSIS — E113512 Type 2 diabetes mellitus with proliferative diabetic retinopathy with macular edema, left eye: Secondary | ICD-10-CM

## 2017-06-24 DIAGNOSIS — I10 Essential (primary) hypertension: Secondary | ICD-10-CM | POA: Diagnosis not present

## 2017-06-24 DIAGNOSIS — E11311 Type 2 diabetes mellitus with unspecified diabetic retinopathy with macular edema: Secondary | ICD-10-CM | POA: Diagnosis not present

## 2017-06-24 DIAGNOSIS — H43813 Vitreous degeneration, bilateral: Secondary | ICD-10-CM | POA: Diagnosis not present

## 2017-07-05 ENCOUNTER — Ambulatory Visit: Payer: Medicare HMO | Admitting: Family Medicine

## 2017-07-05 ENCOUNTER — Encounter: Payer: Self-pay | Admitting: Family Medicine

## 2017-07-05 VITALS — BP 128/78 | Ht 73.0 in | Wt 248.0 lb

## 2017-07-05 DIAGNOSIS — E118 Type 2 diabetes mellitus with unspecified complications: Secondary | ICD-10-CM

## 2017-07-05 DIAGNOSIS — G629 Polyneuropathy, unspecified: Secondary | ICD-10-CM | POA: Diagnosis not present

## 2017-07-05 DIAGNOSIS — I10 Essential (primary) hypertension: Secondary | ICD-10-CM

## 2017-07-05 DIAGNOSIS — Z794 Long term (current) use of insulin: Secondary | ICD-10-CM

## 2017-07-05 DIAGNOSIS — E875 Hyperkalemia: Secondary | ICD-10-CM | POA: Diagnosis not present

## 2017-07-05 DIAGNOSIS — E1165 Type 2 diabetes mellitus with hyperglycemia: Secondary | ICD-10-CM | POA: Diagnosis not present

## 2017-07-05 DIAGNOSIS — N183 Chronic kidney disease, stage 3 (moderate): Secondary | ICD-10-CM

## 2017-07-05 DIAGNOSIS — E1122 Type 2 diabetes mellitus with diabetic chronic kidney disease: Secondary | ICD-10-CM

## 2017-07-05 DIAGNOSIS — E7849 Other hyperlipidemia: Secondary | ICD-10-CM | POA: Diagnosis not present

## 2017-07-05 DIAGNOSIS — IMO0002 Reserved for concepts with insufficient information to code with codable children: Secondary | ICD-10-CM

## 2017-07-05 DIAGNOSIS — Z125 Encounter for screening for malignant neoplasm of prostate: Secondary | ICD-10-CM

## 2017-07-05 LAB — POCT GLYCOSYLATED HEMOGLOBIN (HGB A1C): Hemoglobin A1C: 7.1

## 2017-07-05 NOTE — Progress Notes (Addendum)
   Subjective:    Patient ID: Eugene [name], male    DOB: 1955-11-26, 61 y.o.   MRN: [id number]  Diabetes  He presents for his follow-up diabetic visit. He has type 2 diabetes mellitus. No MedicAlert identification noted. Pertinent negatives for hypoglycemia include no confusion. Pertinent negatives for diabetes include no chest pain, no fatigue, no polydipsia, no polyphagia and no weakness. He has not had a previous visit with a dietitian. He does not see a podiatrist.Eye exam is current.  Patient states he is doing well with dietary measures Watch his diet watches his blood pressure takes his medicine Denies coronary symptoms currently Patient does have peripheral neuropathy watches his feet daily Patient states no concerns this visit.  Patient is trying to watch his cholesterol is well taking medication has difficult time tolerating statins Results for orders placed or performed in visit on 07/05/17  POCT HgB A1C  Result Value Ref Range   Hemoglobin A1C 7.1     Review of Systems  Constitutional: Negative for activity change, appetite change and fatigue.  HENT: Negative for congestion.   Respiratory: Negative for cough.   Cardiovascular: Negative for chest pain.  Gastrointestinal: Negative for abdominal pain.  Endocrine: Negative for polydipsia and polyphagia.  Neurological: Negative for weakness.  Psychiatric/Behavioral: Negative for confusion.       Objective:   Physical Exam  Constitutional: He appears well-nourished. No distress.  Cardiovascular: Normal rate, regular rhythm and normal heart sounds.  No murmur heard. Pulmonary/Chest: Effort normal and breath sounds normal. No respiratory distress.  Musculoskeletal: He exhibits no edema.  Lymphadenopathy:    He has no cervical adenopathy.  Neurological: He is alert.  Psychiatric: His behavior is normal.  Vitals reviewed.    25 minutes was spent with the patient. Greater than half the time was spent in discussion  and answering questions and counseling regarding the issues that the patient came in for today.  Patient has peripheral vascular disease he also has diminished pulses in his feet plus also neuropathy and pre-ulcerative calluses     Assessment & Plan:  Diabetes fairly decent control watch diet continue to watch portions bring down weight as best as possible  Blood pressure good control currently taking his medication watching diet avoiding excessive salt  Hyperlipidemia previous labs reviewed continue medication keep things under control he thought that the Zetia was causing him drowsiness I told him to take it at nighttime  Kidney disease follows with renal specialist no sign of fluid overload  Peripheral neuropathy proper foot care discussed diabetic shoes that would be beneficial  Follow-up again in 4-5 months time

## 2017-07-09 ENCOUNTER — Other Ambulatory Visit: Payer: Self-pay | Admitting: Family Medicine

## 2017-07-09 NOTE — Telephone Encounter (Signed)
This +2 refills

## 2017-07-09 NOTE — Telephone Encounter (Signed)
Last seen on 07/05/17

## 2017-07-10 ENCOUNTER — Telehealth: Payer: Self-pay | Admitting: Family Medicine

## 2017-07-10 NOTE — Telephone Encounter (Signed)
Patient may have an order for diabetic shoes please write this out outside please mail it

## 2017-07-10 NOTE — Telephone Encounter (Signed)
Pt needs order for diabetic shoes  Please advise & call pt when done    Can we mail it to him?

## 2017-07-11 NOTE — Telephone Encounter (Signed)
Spoke with patient and informed him per Dr.Scott Luking- we are mailing a prescription for diabetic shoes. Patient verbalized understanding.

## 2017-07-19 ENCOUNTER — Telehealth: Payer: Self-pay | Admitting: Family Medicine

## 2017-07-19 NOTE — Telephone Encounter (Signed)
Please have Danae Chen look into this, if the original prescription/order cannot be found we will need to re-do this next week and send it to him again

## 2017-07-19 NOTE — Telephone Encounter (Signed)
Routing message to Eugene [name].

## 2017-07-19 NOTE — Telephone Encounter (Signed)
Patient said he was supposed to have Rx for diabetic shoes mailed to him about a week ago, but hasn't received anything.  Please advise.

## 2017-07-19 NOTE — Telephone Encounter (Signed)
It was sent to [facility] to be mailed 07/12/17

## 2017-07-23 ENCOUNTER — Emergency Department (HOSPITAL_COMMUNITY): Payer: Medicare HMO

## 2017-07-23 ENCOUNTER — Encounter (HOSPITAL_COMMUNITY): Payer: Self-pay | Admitting: Emergency Medicine

## 2017-07-23 ENCOUNTER — Emergency Department (HOSPITAL_COMMUNITY)
Admission: EM | Admit: 2017-07-23 | Discharge: 2017-07-23 | Disposition: A | Discharge status: Home / Self Care | Payer: Medicare HMO | Attending: Emergency Medicine | Admitting: Emergency Medicine

## 2017-07-23 DIAGNOSIS — Z794 Long term (current) use of insulin: Secondary | ICD-10-CM | POA: Insufficient documentation

## 2017-07-23 DIAGNOSIS — Z7901 Long term (current) use of anticoagulants: Secondary | ICD-10-CM | POA: Insufficient documentation

## 2017-07-23 DIAGNOSIS — Z79899 Other long term (current) drug therapy: Secondary | ICD-10-CM | POA: Insufficient documentation

## 2017-07-23 DIAGNOSIS — I130 Hypertensive heart and chronic kidney disease with heart failure and stage 1 through stage 4 chronic kidney disease, or unspecified chronic kidney disease: Secondary | ICD-10-CM | POA: Diagnosis not present

## 2017-07-23 DIAGNOSIS — R0602 Shortness of breath: Secondary | ICD-10-CM | POA: Diagnosis not present

## 2017-07-23 DIAGNOSIS — J449 Chronic obstructive pulmonary disease, unspecified: Secondary | ICD-10-CM | POA: Diagnosis not present

## 2017-07-23 DIAGNOSIS — N183 Chronic kidney disease, stage 3 (moderate): Secondary | ICD-10-CM | POA: Diagnosis not present

## 2017-07-23 DIAGNOSIS — I252 Old myocardial infarction: Secondary | ICD-10-CM | POA: Diagnosis not present

## 2017-07-23 DIAGNOSIS — R069 Unspecified abnormalities of breathing: Secondary | ICD-10-CM | POA: Diagnosis not present

## 2017-07-23 DIAGNOSIS — Z7982 Long term (current) use of aspirin: Secondary | ICD-10-CM | POA: Insufficient documentation

## 2017-07-23 DIAGNOSIS — I5033 Acute on chronic diastolic (congestive) heart failure: Secondary | ICD-10-CM | POA: Insufficient documentation

## 2017-07-23 DIAGNOSIS — I509 Heart failure, unspecified: Secondary | ICD-10-CM

## 2017-07-23 DIAGNOSIS — E1140 Type 2 diabetes mellitus with diabetic neuropathy, unspecified: Secondary | ICD-10-CM | POA: Diagnosis not present

## 2017-07-23 DIAGNOSIS — E785 Hyperlipidemia, unspecified: Secondary | ICD-10-CM | POA: Insufficient documentation

## 2017-07-23 DIAGNOSIS — I110 Hypertensive heart disease with heart failure: Secondary | ICD-10-CM | POA: Diagnosis not present

## 2017-07-23 DIAGNOSIS — I10 Essential (primary) hypertension: Secondary | ICD-10-CM | POA: Diagnosis not present

## 2017-07-23 LAB — I-STAT TROPONIN, ED
TROPONIN I, POC: 0.05 ng/mL (ref 0.00–0.08)
Troponin i, poc: 0.07 ng/mL (ref 0.00–0.08)

## 2017-07-23 LAB — CBC
HCT: 37.3 % — ABNORMAL LOW (ref 39.0–52.0)
Hemoglobin: 11.9 g/dL — ABNORMAL LOW (ref 13.0–17.0)
MCH: 29.3 pg (ref 26.0–34.0)
MCHC: 31.9 g/dL (ref 30.0–36.0)
MCV: 91.9 fL (ref 78.0–100.0)
PLATELETS: 165 10*3/uL (ref 150–400)
RBC: 4.06 MIL/uL — ABNORMAL LOW (ref 4.22–5.81)
RDW: 13.6 % (ref 11.5–15.5)
WBC: 5 10*3/uL (ref 4.0–10.5)

## 2017-07-23 LAB — BASIC METABOLIC PANEL
Anion gap: 7 (ref 5–15)
BUN: 56 mg/dL — AB (ref 6–20)
CALCIUM: 9.4 mg/dL (ref 8.9–10.3)
CO2: 26 mmol/L (ref 22–32)
CREATININE: 2.02 mg/dL — AB (ref 0.61–1.24)
Chloride: 105 mmol/L (ref 101–111)
GFR calc Af Amer: 40 mL/min — ABNORMAL LOW (ref >60)
GFR, EST NON AFRICAN AMERICAN: 34 mL/min — AB (ref >60)
Glucose, Bld: 116 mg/dL — ABNORMAL HIGH (ref 65–99)
Potassium: 4.7 mmol/L (ref 3.5–5.1)
SODIUM: 138 mmol/L (ref 135–145)

## 2017-07-23 LAB — BRAIN NATRIURETIC PEPTIDE: B NATRIURETIC PEPTIDE 5: 359 pg/mL — AB (ref 0.0–100.0)

## 2017-07-23 MED ORDER — ASPIRIN 81 MG PO CHEW: 324.0000 mg | CHEWABLE_TABLET | Freq: Once | ORAL | Status: DC

## 2017-07-23 NOTE — Discharge Instructions (Signed)
Follow-up with your primary care doctor and cardiologist.  Continue your current medications.  Return to the emergency room for fever worsening symptoms.

## 2017-07-23 NOTE — ED Notes (Signed)
Pt has not taken his BP medication this morning.

## 2017-07-23 NOTE — ED Provider Notes (Signed)
[facility] EMERGENCY DEPARTMENT Provider Note   CSN: [id number] Arrival date & time: 07/23/17  1206     History   Chief Complaint Chief Complaint  Patient presents with  . Shortness of Breath    HPI Eugene [name] is a 61 y.o. male.  HPI Pt woke up feeling short of breath this am.  He also noticed fluttering in his chest and he gained 2 lbs.  He took asa, fluid pills, and NTG.  His symptoms have now resolved.  He was concerned so he called EMS.  All of his symptoms have now resolved but they recommended he come to the ED.  Pt feels normal now.  He just does not know why he feels better.  Usually 2 lbs don't bother him.  No fever.  No cough.  No chest pain. Past Medical History:  Diagnosis Date  . Anemia   . Anxiety   . Arteriosclerotic cardiovascular disease (ASCVD)    a. 05/2011 s/p DES to PDA and RCA. b. 08/2012 Inflat  STEMI/Cath/PCI: LM minor irregs, LAD 50p, D1 50, LCX nl, OM1 25, RCA 30-40p, 100d (treated with 2.75x43mm Promus Premier DES);    [name] Asthma   . Bell palsy   . C. difficile colitis    a. 08/2012  . Cholelithiasis 07/2012   Asymptomatic; identified incidentally  . Chronic diastolic CHF (congestive heart failure) ([place])   . Contrast dye induced nephropathy    a. 08/2012 post cath/pci  . COPD (chronic obstructive pulmonary disease) ([place])   . Diabetes mellitus    Peripheral neuropathy  . Gallstones   . GERD (gastroesophageal reflux disease)   . Hyperlipidemia   . Hypertension   . Myocardial infarct ([place]) 09/08/12  . Nephrolithiasis   . Old myocardial infarction   . Pericardial effusion    a. s/p window in 2014.  [name] PONV (postoperative nausea and vomiting)   . Sleep apnea     Patient Active Problem List   Diagnosis Date Noted  . DM type 2 causing CKD stage 3 ([place]) 11/14/2016  . Acute on chronic systolic congestive heart failure ([place])   . Cardiomyopathy ([place])   . Acute on chronic diastolic CHF (congestive heart failure) ([place]) 11/10/2016  . Elevated  troponin   . Coronary artery disease involving native coronary artery of native heart without angina pectoris   . Moderate nonproliferative diabetic retinopathy of both eyes without macular edema associated with type 2 diabetes mellitus ([place]) 10/22/2016  . Hyperkalemia 06/14/2016  . Non-ST elevation myocardial infarction (NSTEMI) ([place])   . Morbid obesity ([place])   . Diabetes mellitus type 2, uncontrolled, with complications ([place]) 83/15/1761  . Right carotid bruit 11/10/2015  . Pain in the chest   . Severe hypertension 11/09/2015  . Hypertensive urgency 11/09/2015  . Gout of wrist 08/08/2015  . Pericardial effusion without cardiac tamponade 05/26/2015  . HLD (hyperlipidemia) 05/26/2015  . Anxiety 05/26/2015  . Olecranon bursitis   . Inflammatory arthritis   . Elbow joint pain 01/21/2015  . SOB (shortness of breath) on exertion 12/26/2014  . Atypical chest pain 12/25/2014  . Constipation 10/26/2014  . Abdominal pain 10/26/2014  . Peripheral edema 01/29/2014  . Acute on chronic diastolic heart failure ([place]) 01/26/2014  . Acute on chronic renal failure ([place]) 01/26/2014  . Acute kidney injury superimposed on chronic kidney disease ([place]) 12/14/2013  . Chest pain 12/13/2013  . SOB (shortness of breath) 10/16/2013  . Dyspnea 10/15/2013  . Chronic diastolic CHF (congestive heart failure) ([place])  10/15/2013  . NSVT (nonsustained ventricular tachycardia) ([place]) 06/26/2013  . Type 2 diabetes mellitus with neurological manifestations ([place]) 06/26/2013  . Acute diastolic CHF (congestive heart failure) ([place]) 06/26/2013  . Pericardial effusion 06/26/2013  . Old myocardial infarction   . Gastroparesis due to DM ([place]) 05/21/2013  . CAD (coronary artery disease) 09/16/2012  . Acute on chronic renal insufficiency 09/14/2012  . Edema 08/18/2012  . Anemia, normocytic normochromic 07/31/2012  . Cholelithiasis 07/27/2012  . GERD (gastroesophageal reflux disease) 05/10/2012  . Noncompliance 05/09/2012  .  Peripheral neuropathy 10/24/2011  . Obstructive sleep apnea- on C-pap 08/23/2011  . Hyponatremia 08/23/2011  . CAD- MI/RCA PCI-DES x2 2012, and RCA DES Jan 2014   . Hyperlipidemia   . Cholelithiasis 05/28/2011  . DM type 2 (diabetes mellitus, type 2) ([place]) 05/25/2011  . Essential hypertension 05/25/2011    Past Surgical History:  Procedure Laterality Date  . CARDIAC CATHETERIZATION    . CHEST TUBE INSERTION    . CIRCUMCISION    . COLONOSCOPY     In [facility], approximately 2011 per patient, was normal. Advised to come back in 10 years.  . ESOPHAGOGASTRODUODENOSCOPY     in [place] over 20 yrs ago  . ESOPHAGOGASTRODUODENOSCOPY  06/12/2012   MCN:OBSJGG esophagus-status post Venia Minks dilation. Abnormal gastric mucosa of uncertain significance-status post biopsy  . INTRAOPERATIVE TRANSESOPHAGEAL ECHOCARDIOGRAM N/A 06/23/2013   Procedure: INTRAOPERATIVE TRANSESOPHAGEAL ECHOCARDIOGRAM;  Surgeon: Grace Isaac, MD;  Location: [facility];  Service: Open Heart Surgery;  Laterality: N/A;  . LEFT HEART CATHETERIZATION WITH CORONARY ANGIOGRAM N/A 09/08/2012   Procedure: LEFT HEART CATHETERIZATION WITH CORONARY ANGIOGRAM;  Surgeon: [name], MD;  Location: [facility] CATH LAB;  Service: Cardiovascular;  Laterality: N/A;  . stents    . SUBXYPHOID PERICARDIAL WINDOW N/A 06/23/2013   Procedure: SUBXYPHOID PERICARDIAL WINDOW;  Surgeon: Grace Isaac, MD;  Location: [facility] OR;  Service: Thoracic;  Laterality: N/A;       Home Medications    Prior to Admission medications   Medication Sig Start Date End Date Taking? Authorizing Provider  acetaminophen (TYLENOL) 500 MG tablet Take 1,000 mg by mouth every 6 (six) hours as needed for mild pain, moderate pain, fever or headache.    Yes [provider]  ALPRAZolam Duanne Moron) 0.5 MG tablet TAKE 1/2-1 TABLET BY MOUTH AT BEDTIME AS NEEDED 01/18/17  Yes [name], MD  aspirin 81 MG chewable tablet Chew 81 mg by mouth at bedtime.   Yes  [provider]  clopidogrel (PLAVIX) 75 MG tablet TAKE 1 TABLET AT BEDTIME 12/18/16  Yes Luking, Scott A, MD  COLCRYS 0.6 MG tablet TAKE 2 TABLETS BY MOUTH NOW AND THEN TAKE 1 TABLET BY MOUTH TWO TIMES A DAY UNTIL RELIEF 06/04/17  Yes Luking, Scott A, MD  ezetimibe (ZETIA) 10 MG tablet Take 1 tablet (10 mg total) by mouth daily. 03/11/17  Yes [name], MD  fenofibrate 160 MG tablet TAKE 1 TABLET AT BEDTIME 12/18/16  Yes Luking, Elayne Snare, MD  glimepiride (AMARYL) 1 MG tablet Take twice a day with meals Patient taking differently: Take 1 mg by mouth 2 (two) times daily with a meal.  10/22/16  Yes Luking, Scott A, MD  hydrALAZINE (APRESOLINE) 10 MG tablet Take 2 tablets (20 mg total) by mouth 2 (two) times daily. 01/11/17  Yes Herminio Commons, MD  Insulin Pen Needle (BD PEN NEEDLE NANO U/F) 32G X 4 MM MISC Use once daily as directed 05/02/17  Yes Penuelas,  Scott A, MD  isosorbide mononitrate (IMDUR) 60 MG 24 hr tablet Take 1 tablet (60 mg total) by mouth daily. 11/29/16  Yes Herminio Commons, MD  LEVEMIR FLEXTOUCH 100 UNIT/ML Pen INJECT 80 UNITS INTO THE SKIN DAILY AT 10 PM. 05/06/17  Yes Luking, Elayne Snare, MD  losartan (COZAAR) 25 MG tablet Take 12.5 mg by mouth daily.  01/29/17  Yes [provider]  metoprolol tartrate (LOPRESSOR) 25 MG tablet TAKE 1 TABLET TWICE DAILY 12/18/16  Yes Luking, Elayne Snare, MD  nitroGLYCERIN (NITROSTAT) 0.4 MG SL tablet Place 1 tablet (0.4 mg total) under the tongue every 5 (five) minutes x 3 doses as needed for chest pain. 06/15/16  Yes Strader, Fransisco Hertz, PA-C  OVER THE COUNTER MEDICATION Apply 1 application topically daily. Diabetic foot cream 11/06/16  Yes [provider]  torsemide (DEMADEX) 20 MG tablet Take 1 tablet (20 mg total) by mouth daily. 11/14/16  Yes Kathie Dike, MD  traMADol (ULTRAM) 50 MG tablet TAKE ONE TABLET BY MOUTH EVERY 6 HOURS AS NEEDED FOR PAIN 07/09/17  Yes [name], MD  magic mouthwash w/lidocaine SOLN Take 5 mLs  by mouth 3 (three) times daily as needed for mouth pain. Swish and spit, do not swallow Patient not taking: Reported on 07/23/2017 02/09/17   Triplett, Tammy, PA-C  ranitidine (ZANTAC) 300 MG tablet Take 1 tablet (300 mg total) by mouth at bedtime. Patient not taking: Reported on 07/23/2017 03/05/17   [name], MD    Family History Family History  Problem Relation Age of Onset  . Diabetes Mother   . Heart attack Mother   . Stroke Mother   . Diabetes Sister   . Sleep apnea Sister   . Hypertension Brother   . Diabetes Brother   . Diabetes Brother   . Hypertension Brother   . Colon cancer Neg Hx   . Liver disease Neg Hx     Social History Social History   Tobacco Use  . Smoking status: Never Smoker  . Smokeless tobacco: Never Used  Substance Use Topics  . Alcohol use: No    Alcohol/week: 0.0 oz    Comment: heavy etoh use 30 years ago  . Drug use: No     Allergies   Hydrocodone; Lisinopril; Neurontin [gabapentin]; Statins; Metformin and related; and Norvasc [amlodipine besylate]   Review of Systems Review of Systems  All other systems reviewed and are negative.    Physical Exam Updated Vital Signs BP (!) 152/92   Pulse 62   Temp 97.8 F (36.6 C) (Oral)   Resp 16   Ht 1.854 m (6\' 1" )   Wt 115.2 kg (254 lb)   SpO2 96%   BMI 33.51 kg/m   Physical Exam  Constitutional: He appears well-developed and well-nourished. No distress.  HENT:  Head: Normocephalic and atraumatic.  Right Ear: External ear normal.  Left Ear: External ear normal.  Eyes: Conjunctivae are normal. Right eye exhibits no discharge. Left eye exhibits no discharge. No scleral icterus.  Neck: Neck supple. No tracheal deviation present.  Cardiovascular: Normal rate, regular rhythm and intact distal pulses.  Pulmonary/Chest: Effort normal and breath sounds normal. No stridor. No respiratory distress. He has no wheezes. He has no rales.  Abdominal: Soft. Bowel sounds are normal. He exhibits  no distension. There is no tenderness. There is no rebound and no guarding.  Musculoskeletal: He exhibits no tenderness.       Right lower leg: He exhibits edema (mild).  Left lower leg: Left lower leg edema: mild.  Neurological: He is alert. He has normal strength. No cranial nerve deficit (no facial droop, extraocular movements intact, no slurred speech) or sensory deficit. He exhibits normal muscle tone. He displays no seizure activity. Coordination normal.  Skin: Skin is warm and dry. No rash noted.  Psychiatric: He has a normal mood and affect.  Nursing note and vitals reviewed.    ED Treatments / Results  Labs (all labs ordered are listed, but only abnormal results are displayed) Labs Reviewed  BASIC METABOLIC PANEL - Abnormal; Notable for the following components:      Result Value   Glucose, Bld 116 (*)    BUN 56 (*)    Creatinine, Ser 2.02 (*)    GFR calc non Af Amer 34 (*)    GFR calc Af Amer 40 (*)    All other components within normal limits  CBC - Abnormal; Notable for the following components:   RBC 4.06 (*)    Hemoglobin 11.9 (*)    HCT 37.3 (*)    All other components within normal limits  BRAIN NATRIURETIC PEPTIDE - Abnormal; Notable for the following components:   B Natriuretic Peptide 359.0 (*)    All other components within normal limits  I-STAT TROPONIN, ED  I-STAT TROPONIN, ED    EKG  EKG Interpretation  Date/Time:  Tuesday July 23 2017 12:13:18 EST Ventricular Rate:  75 PR Interval:    QRS Duration: 108 QT Interval:  391 QTC Calculation: 437 R Axis:   55 Text Interpretation:  Sinus rhythm Anterior infarct, old Nonspecific T abnormalities, lateral leads Baseline wander in lead(s) II III aVL aVF No significant change since last tracing Confirmed by Dorie Rank [phone]) on 07/23/2017 12:45:57 PM       Radiology Dg Chest 2 View  Result Date: 07/23/2017 CLINICAL DATA:  Short of breath EXAM: CHEST  2 VIEW COMPARISON:  11/10/2016 FINDINGS:  Cardiac enlargement without heart failure. Patchy bibasilar airspace disease similar to the prior study. No effusion. IMPRESSION: Mild bibasilar atelectasis or infiltrate similar to 11/10/2016. Possible chronic or recurrent lung disease. Electronically Signed   By: Franchot Gallo M.D.   On: 07/23/2017 12:46    Procedures Procedures (including critical care time)  Medications Ordered in ED Medications  aspirin chewable tablet 324 mg (324 mg Oral Not Given 07/23/17 1327)     Initial Impression / Assessment and Plan / ED Course  I have reviewed the triage vital signs and the nursing notes.  Pertinent labs & imaging results that were available during my care of the patient were reviewed by me and considered in my medical decision making (see chart for details).  Clinical Course as of Jul 23 1624  Tue Jul 23, 2017  1402 First troponin 0.05.  Within normal range  [JK]    Clinical Course User Index [JK] Dorie Rank, MD    She presented to the emergency room for an episode of palpitations and shortness of breath.  His symptoms all resolved prior to arrival.  Patient's laboratory tests are notable for mild elevation in his BNP.  He also has an elevation in his creatinine.  I did review his previous blood tests and his creatinine has ranged from 1.6 to greater than 2.6 in the past year.  His creatinine today seems to be in that range although it is increased from the most recent value.  I doubt that this is clinically significant at this time.  No evidence  of pneumonia or severe pulmonary edema on chest x-ray.  Possible his symptoms were related to his congestive heart failure.  Patient is feeling better after taking his diuretic this morning.  At this point I feel that he is safe to go home and follow-up with his primary care doctor or cardiologist.  I doubt severe congestive heart failure, acute coronary syndrome, pulmonary embolism or pneumonia.  Final Clinical Impressions(s) / ED Diagnoses    Final diagnoses:  Chronic congestive heart failure, unspecified heart failure type [facility])    ED Discharge Orders    None       Dorie Rank, MD 07/23/17 1625

## 2017-07-23 NOTE — ED Notes (Signed)
CRITICAL VALUE ALERT  Critical Value:  Troponin 0.05   Date & Time Notied:  07/23/17 1343  Provider Notified:Notified MD   Orders Received/Actions taken: Notified Dr. Tomi Bamberger

## 2017-07-23 NOTE — ED Notes (Signed)
Pt did take 2 nitro and 4 baby ASA PTA.

## 2017-07-23 NOTE — ED Triage Notes (Signed)
Pt c/o SOB since this AM, denies cough or fever. Believes he was having some "fluttering" in his chest this morning. Denies CP. Has had 2 pound wt gain since yesterday, hx of CHF.

## 2017-07-24 ENCOUNTER — Other Ambulatory Visit: Payer: Self-pay | Admitting: Family Medicine

## 2017-07-29 ENCOUNTER — Other Ambulatory Visit: Payer: Self-pay | Admitting: Family Medicine

## 2017-07-29 NOTE — Telephone Encounter (Signed)
Patient last seen 11.09.18

## 2017-07-29 NOTE — Telephone Encounter (Signed)
May have this plus four additional refills

## 2017-08-07 ENCOUNTER — Encounter (INDEPENDENT_AMBULATORY_CARE_PROVIDER_SITE_OTHER): Payer: Medicare HMO | Admitting: Ophthalmology

## 2017-08-29 ENCOUNTER — Other Ambulatory Visit: Payer: Self-pay | Admitting: *Deleted

## 2017-08-29 ENCOUNTER — Other Ambulatory Visit: Payer: Self-pay | Admitting: Family Medicine

## 2017-08-29 ENCOUNTER — Other Ambulatory Visit: Payer: Self-pay | Admitting: Cardiovascular Disease

## 2017-08-29 MED ORDER — TORSEMIDE 20 MG PO TABS: 20.0000 mg | ORAL_TABLET | Freq: Every day | ORAL | 5 refills | Status: DC

## 2017-09-02 ENCOUNTER — Encounter (INDEPENDENT_AMBULATORY_CARE_PROVIDER_SITE_OTHER): Payer: Medicare HMO | Admitting: Ophthalmology

## 2017-09-02 DIAGNOSIS — H43813 Vitreous degeneration, bilateral: Secondary | ICD-10-CM

## 2017-09-02 DIAGNOSIS — E113512 Type 2 diabetes mellitus with proliferative diabetic retinopathy with macular edema, left eye: Secondary | ICD-10-CM

## 2017-09-02 DIAGNOSIS — E11311 Type 2 diabetes mellitus with unspecified diabetic retinopathy with macular edema: Secondary | ICD-10-CM

## 2017-09-02 DIAGNOSIS — I10 Essential (primary) hypertension: Secondary | ICD-10-CM

## 2017-09-02 DIAGNOSIS — H35033 Hypertensive retinopathy, bilateral: Secondary | ICD-10-CM

## 2017-09-02 DIAGNOSIS — D3132 Benign neoplasm of left choroid: Secondary | ICD-10-CM | POA: Diagnosis not present

## 2017-09-02 DIAGNOSIS — E113311 Type 2 diabetes mellitus with moderate nonproliferative diabetic retinopathy with macular edema, right eye: Secondary | ICD-10-CM | POA: Diagnosis not present

## 2017-09-04 ENCOUNTER — Other Ambulatory Visit: Payer: Self-pay | Admitting: *Deleted

## 2017-09-04 MED ORDER — TORSEMIDE 20 MG PO TABS: 20.0000 mg | ORAL_TABLET | Freq: Every day | ORAL | 1 refills | Status: DC

## 2017-09-07 DIAGNOSIS — I10 Essential (primary) hypertension: Secondary | ICD-10-CM | POA: Diagnosis not present

## 2017-09-07 DIAGNOSIS — N183 Chronic kidney disease, stage 3 (moderate): Secondary | ICD-10-CM | POA: Diagnosis not present

## 2017-09-07 DIAGNOSIS — Z79899 Other long term (current) drug therapy: Secondary | ICD-10-CM | POA: Diagnosis not present

## 2017-09-07 DIAGNOSIS — R809 Proteinuria, unspecified: Secondary | ICD-10-CM | POA: Diagnosis not present

## 2017-09-07 DIAGNOSIS — D509 Iron deficiency anemia, unspecified: Secondary | ICD-10-CM | POA: Diagnosis not present

## 2017-09-07 DIAGNOSIS — Z125 Encounter for screening for malignant neoplasm of prostate: Secondary | ICD-10-CM | POA: Diagnosis not present

## 2017-09-07 DIAGNOSIS — E7849 Other hyperlipidemia: Secondary | ICD-10-CM | POA: Diagnosis not present

## 2017-09-07 DIAGNOSIS — E559 Vitamin D deficiency, unspecified: Secondary | ICD-10-CM | POA: Diagnosis not present

## 2017-09-09 ENCOUNTER — Encounter: Payer: Self-pay | Admitting: Family Medicine

## 2017-09-09 ENCOUNTER — Telehealth: Payer: Self-pay | Admitting: Family Medicine

## 2017-09-09 LAB — LIPID PANEL
Chol/HDL Ratio: 5.5 ratio — ABNORMAL HIGH (ref 0.0–5.0)
Cholesterol, Total: 187 mg/dL (ref 100–199)
HDL: 34 mg/dL — ABNORMAL LOW (ref >39)
LDL Calculated: 107 mg/dL — ABNORMAL HIGH (ref 0–99)
Triglycerides: 228 mg/dL — ABNORMAL HIGH (ref 0–149)
VLDL Cholesterol Cal: 46 mg/dL — ABNORMAL HIGH (ref 5–40)

## 2017-09-09 LAB — PSA: Prostate Specific Ag, Serum: 1.1 ng/mL (ref 0.0–4.0)

## 2017-09-09 NOTE — Telephone Encounter (Signed)
Patient dropped off handicap form just needs your signature on it. In your yellow folder in office.

## 2017-09-11 DIAGNOSIS — N183 Chronic kidney disease, stage 3 (moderate): Secondary | ICD-10-CM | POA: Diagnosis not present

## 2017-09-11 DIAGNOSIS — E559 Vitamin D deficiency, unspecified: Secondary | ICD-10-CM | POA: Diagnosis not present

## 2017-09-11 DIAGNOSIS — D638 Anemia in other chronic diseases classified elsewhere: Secondary | ICD-10-CM | POA: Diagnosis not present

## 2017-09-11 DIAGNOSIS — E875 Hyperkalemia: Secondary | ICD-10-CM | POA: Diagnosis not present

## 2017-09-11 DIAGNOSIS — R809 Proteinuria, unspecified: Secondary | ICD-10-CM | POA: Diagnosis not present

## 2017-09-11 NOTE — Telephone Encounter (Signed)
Form was signed thank you 

## 2017-09-23 ENCOUNTER — Other Ambulatory Visit: Payer: Self-pay | Admitting: Family Medicine

## 2017-09-23 NOTE — Telephone Encounter (Signed)
May have this +2 refills 

## 2017-10-14 ENCOUNTER — Encounter (INDEPENDENT_AMBULATORY_CARE_PROVIDER_SITE_OTHER): Payer: Medicare HMO | Admitting: Ophthalmology

## 2017-11-07 ENCOUNTER — Encounter (INDEPENDENT_AMBULATORY_CARE_PROVIDER_SITE_OTHER): Payer: Medicare HMO | Admitting: Ophthalmology

## 2017-11-07 DIAGNOSIS — D3132 Benign neoplasm of left choroid: Secondary | ICD-10-CM

## 2017-11-07 DIAGNOSIS — E113512 Type 2 diabetes mellitus with proliferative diabetic retinopathy with macular edema, left eye: Secondary | ICD-10-CM

## 2017-11-07 DIAGNOSIS — H43813 Vitreous degeneration, bilateral: Secondary | ICD-10-CM

## 2017-11-07 DIAGNOSIS — E113311 Type 2 diabetes mellitus with moderate nonproliferative diabetic retinopathy with macular edema, right eye: Secondary | ICD-10-CM

## 2017-11-07 DIAGNOSIS — H35033 Hypertensive retinopathy, bilateral: Secondary | ICD-10-CM | POA: Diagnosis not present

## 2017-11-07 DIAGNOSIS — E11311 Type 2 diabetes mellitus with unspecified diabetic retinopathy with macular edema: Secondary | ICD-10-CM

## 2017-11-07 DIAGNOSIS — I10 Essential (primary) hypertension: Secondary | ICD-10-CM

## 2017-11-07 LAB — HM DIABETES EYE EXAM

## 2017-11-12 ENCOUNTER — Other Ambulatory Visit: Payer: Self-pay

## 2017-11-12 ENCOUNTER — Encounter: Payer: Self-pay | Admitting: *Deleted

## 2017-11-12 ENCOUNTER — Ambulatory Visit (INDEPENDENT_AMBULATORY_CARE_PROVIDER_SITE_OTHER): Payer: Medicare HMO | Admitting: Cardiovascular Disease

## 2017-11-12 ENCOUNTER — Encounter: Payer: Self-pay | Admitting: Cardiovascular Disease

## 2017-11-12 VITALS — BP 160/84 | HR 61 | Ht 72.0 in | Wt 249.0 lb

## 2017-11-12 DIAGNOSIS — I3139 Other pericardial effusion (noninflammatory): Secondary | ICD-10-CM

## 2017-11-12 DIAGNOSIS — I110 Hypertensive heart disease with heart failure: Secondary | ICD-10-CM | POA: Diagnosis not present

## 2017-11-12 DIAGNOSIS — I5042 Chronic combined systolic (congestive) and diastolic (congestive) heart failure: Secondary | ICD-10-CM

## 2017-11-12 DIAGNOSIS — R600 Localized edema: Secondary | ICD-10-CM

## 2017-11-12 DIAGNOSIS — I313 Pericardial effusion (noninflammatory): Secondary | ICD-10-CM

## 2017-11-12 DIAGNOSIS — E785 Hyperlipidemia, unspecified: Secondary | ICD-10-CM | POA: Diagnosis not present

## 2017-11-12 DIAGNOSIS — I25708 Atherosclerosis of coronary artery bypass graft(s), unspecified, with other forms of angina pectoris: Secondary | ICD-10-CM | POA: Diagnosis not present

## 2017-11-12 DIAGNOSIS — I252 Old myocardial infarction: Secondary | ICD-10-CM | POA: Diagnosis not present

## 2017-11-12 MED ORDER — HYDRALAZINE HCL 10 MG PO TABS: 20.0000 mg | ORAL_TABLET | Freq: Two times a day (BID) | ORAL | 3 refills | Status: DC

## 2017-11-12 MED ORDER — RIVAROXABAN 2.5 MG PO TABS: 2.5000 mg | ORAL_TABLET | Freq: Two times a day (BID) | ORAL | 6 refills | Status: DC

## 2017-11-12 NOTE — Patient Instructions (Signed)
Medication Instructions:   Increase Hydralazine to 20mg  twice a day.  Stop Plavix.  Begin Xarelto 2.5mg  twice a day.  Continue all other medications.    Labwork: none  Testing/Procedures: none  Follow-Up: Your physician wants you to follow up in: 6 months.  You will receive a reminder letter in the mail one-two months in advance.  If you don't receive a letter, please call our office to schedule the follow up appointment   Any Other Special Instructions Will Be Listed Below (If Applicable).  If you need a refill on your cardiac medications before your next appointment, please call your pharmacy.

## 2017-11-12 NOTE — Progress Notes (Signed)
SUBJECTIVE: The patient presents for routine follow-up.  He is doing well and denies chest pain, palpitations, and shortness of breath.  He brought his blood pressure log which I reviewed.  There are several hypertensive readings with systolic blood pressures in the 170-180 range.  It does get as low as the 110-120 range.  When systolic blood pressures are in the 120 range, he feels sluggish.  He wants to lose about 30 more pounds.  He has been taking hydralazine 20 mg every morning and 10 mg every evening.  Echocardiogram on 06/12/17 demonstrated normal left ventricular systolic function, LVEF 46-96%, wall motion abnormalities, and a moderate to large sized pericardial effusion.  There was a mild degree of right atrial inversion but no frank tamponade physiology.  Nuclear stress test on 11/13/16 showed a large area of scar in the anterior wall and septum with a trivial region of partial reversibility toward the apex suggesting minor peri-infarct ischemia.  Coronary angiography on 09/08/12 demonstrated 50% proximal LAD stenosis, 50% D1 stenosis, OM1 with mild 25% stenosis, proximal RCA 30-40% stenosis with previously implanted stent. The distal vessel was 100% occluded prior to PCI.      Review of Systems: As per "subjective", otherwise negative.  Allergies  Allergen Reactions  . Hydrocodone Nausea And Vomiting  . Lisinopril Cough  . Neurontin [Gabapentin] Other (See Comments)    Reaction:  Suicidal thoughts   . Statins Other (See Comments)    Reaction:  Muscle pain   . Metformin And Related Diarrhea  . Norvasc [Amlodipine Besylate] Swelling and Other (See Comments)    Reaction:  Pedal edema    Current Outpatient Medications  Medication Sig Dispense Refill  . acetaminophen (TYLENOL) 500 MG tablet Take 1,000 mg by mouth every 6 (six) hours as needed for mild pain, moderate pain, fever or headache.     . ALPRAZolam (XANAX) 0.5 MG tablet TAKE 1/2-1 TABLET BY MOUTH AT BEDTIME AS  NEEDED 30 tablet 4  . aspirin 81 MG chewable tablet Chew 81 mg by mouth at bedtime.    . clopidogrel (PLAVIX) 75 MG tablet TAKE 1 TABLET AT BEDTIME 90 tablet 1  . COLCRYS 0.6 MG tablet TAKE 2 TABLETS BY MOUTH NOW AND THEN TAKE 1 TABLET BY MOUTH TWO TIMES A DAY UNTIL RELIEF 20 tablet 2  . fenofibrate 160 MG tablet TAKE 1 TABLET AT BEDTIME 90 tablet 1  . glimepiride (AMARYL) 1 MG tablet Take twice a day with meals (Patient taking differently: Take 1 mg by mouth 2 (two) times daily with a meal. ) 60 tablet 5  . hydrALAZINE (APRESOLINE) 10 MG tablet TAKE 2 TABLETS TWICE DAILY 360 tablet 3  . Insulin Pen Needle (BD PEN NEEDLE NANO U/F) 32G X 4 MM MISC Use once daily as directed 100 each 0  . isosorbide mononitrate (IMDUR) 60 MG 24 hr tablet Take 1 tablet (60 mg total) by mouth daily. 90 tablet 3  . LEVEMIR FLEXTOUCH 100 UNIT/ML Pen INJECT 80 UNITS INTO THE SKIN DAILY AT 10 PM. 30 mL 4  . losartan (COZAAR) 25 MG tablet Take 12.5 mg by mouth daily.     . metoprolol tartrate (LOPRESSOR) 25 MG tablet TAKE 1 TABLET TWICE DAILY 180 tablet 1  . nitroGLYCERIN (NITROSTAT) 0.4 MG SL tablet Place 1 tablet (0.4 mg total) under the tongue every 5 (five) minutes x 3 doses as needed for chest pain. 25 tablet 2  . OVER THE COUNTER MEDICATION Apply 1 application topically  daily. Diabetic foot cream    . torsemide (DEMADEX) 20 MG tablet Take 1 tablet (20 mg total) by mouth daily. 90 tablet 1  . traMADol (ULTRAM) 50 MG tablet TAKE ONE TABLET BY MOUTH EVERY 6 HOURS AS NEEDED FOR PAIN 24 tablet 2   No current facility-administered medications for this visit.     Past Medical History:  Diagnosis Date  . Anemia   . Anxiety   . Arteriosclerotic cardiovascular disease (ASCVD)    a. 05/2011 s/p DES to PDA and RCA. b. 08/2012 Inflat  STEMI/Cath/PCI: LM minor irregs, LAD 50p, D1 50, LCX nl, OM1 25, RCA 30-40p, 100d (treated with 2.75x78mm Promus Premier DES);    [name] Asthma   . Bell palsy   . C. difficile colitis    a.  08/2012  . Cholelithiasis 07/2012   Asymptomatic; identified incidentally  . Chronic diastolic CHF (congestive heart failure) ([place])   . Contrast dye induced nephropathy    a. 08/2012 post cath/pci  . COPD (chronic obstructive pulmonary disease) ([place])   . Diabetes mellitus    Peripheral neuropathy  . Gallstones   . GERD (gastroesophageal reflux disease)   . Hyperlipidemia   . Hypertension   . Myocardial infarct ([place]) 09/08/12  . Nephrolithiasis   . Old myocardial infarction   . Pericardial effusion    a. s/p window in 2014.  [name] PONV (postoperative nausea and vomiting)   . Sleep apnea     Past Surgical History:  Procedure Laterality Date  . CARDIAC CATHETERIZATION    . CHEST TUBE INSERTION    . CIRCUMCISION    . COLONOSCOPY     In [facility], approximately 2011 per patient, was normal. Advised to come back in 10 years.  . ESOPHAGOGASTRODUODENOSCOPY     in [place] over 20 yrs ago  . ESOPHAGOGASTRODUODENOSCOPY  06/12/2012   HYQ:MVHQIO esophagus-status post Venia Minks dilation. Abnormal gastric mucosa of uncertain significance-status post biopsy  . INTRAOPERATIVE TRANSESOPHAGEAL ECHOCARDIOGRAM N/A 06/23/2013   Procedure: INTRAOPERATIVE TRANSESOPHAGEAL ECHOCARDIOGRAM;  Surgeon: Grace Isaac, MD;  Location: [facility];  Service: Open Heart Surgery;  Laterality: N/A;  . LEFT HEART CATHETERIZATION WITH CORONARY ANGIOGRAM N/A 09/08/2012   Procedure: LEFT HEART CATHETERIZATION WITH CORONARY ANGIOGRAM;  Surgeon: [name], MD;  Location: [facility] CATH LAB;  Service: Cardiovascular;  Laterality: N/A;  . stents    . SUBXYPHOID PERICARDIAL WINDOW N/A 06/23/2013   Procedure: SUBXYPHOID PERICARDIAL WINDOW;  Surgeon: Grace Isaac, MD;  Location: [facility] OR;  Service: Thoracic;  Laterality: N/A;    Social History   Socioeconomic History  . Marital status: Married    Spouse name: Not on file  . Number of children: 2  . Years of education: Not on file  . Highest education level: Not  on file  Social Needs  . Financial resource strain: Not on file  . Food insecurity - worry: Not on file  . Food insecurity - inability: Not on file  . Transportation needs - medical: Not on file  . Transportation needs - non-medical: Not on file  Occupational History  . 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 Smoking status: Never Smoker  . Smokeless tobacco: Never Used  Substance and Sexual Activity  . Alcohol use: No    Alcohol/week: 0.0 oz    Comment: heavy etoh use 30 years ago  . Drug use: No  . Sexual activity: Yes    Birth control/protection:  None  Other Topics Concern  . 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 Disabled at this point.     Vitals:   11/12/17 1122  BP: (!) 160/84  Pulse: 61  SpO2: 98%  Weight: 249 lb (112.9 kg)  Height: 6' (1.829 m)    Wt Readings from Last 3 Encounters:  11/12/17 249 lb (112.9 kg)  07/23/17 254 lb (115.2 kg)  07/05/17 248 lb (112.5 kg)     PHYSICAL EXAM General: NAD HEENT: Normal. Neck: No JVD, no thyromegaly. Lungs: Clear to auscultation bilaterally with normal respiratory effort. CV: Regular rate and rhythm, normal S1/S2, no S3/S4, no murmur. No pretibial or periankle edema.  No carotid bruit.   Abdomen: Soft, nontender, no distention.  Neurologic: Alert and oriented.  Psych: Normal affect. Skin: Normal. Musculoskeletal: No gross deformities.    ECG: Most recent ECG reviewed.   Labs: Lab Results  Component Value Date/Time   K 4.7 07/23/2017 01:06 PM   BUN 56 (H) 07/23/2017 01:06 PM   BUN 37 (H) 01/02/2017 10:24 AM   CREATININE 2.02 (H) 07/23/2017 01:06 PM   CREATININE 1.26 10/26/2014 03:29 PM   ALT 15 (L) 01/07/2017 05:07 AM   TSH 1.585 11/02/2016 09:58 PM   TSH 1.780 10/26/2014 03:25 PM   HGB 11.9 (L) 07/23/2017 01:06 PM   HGB 11.2 (L) 11/27/2016 02:10 PM     Lipids: Lab Results  Component  Value Date/Time   LDLCALC 107 (H) 09/07/2017 09:40 AM   CHOL 187 09/07/2017 09:40 AM   TRIG 228 (H) 09/07/2017 09:40 AM   HDL 34 (L) 09/07/2017 09:40 AM       ASSESSMENT AND PLAN: 1. Chronic combined systolic and diastolic heart failure: Euvolemic on torsemide 20 mg daily. Blood pressure is elevated and I will aim to control this by increasing hydralazine to 20 mg twice daily. Echo from July 2018 reviewed above.  2. Coronary artery disease with demand ischemia: Symptomatically stable. Nuclear stress test reviewed above in detail. Continue aspirin, nitrates, and metoprolol.  I will discontinue Plavix and start Xarelto 2.5 mg twice daily. Most recent nuclear stress test detailed above with only minor peri-infarct ischemia.  He is statin intolerant.  I will obtain a copy of most recent lipids to see if he requires PCSK-9 inhibitors.  3. Pericardial effusion: Most recent echocardiogram detailed above.  I will continue to monitor.  I will obtain a limited echocardiogram in the near future.  4. Hyperlipidemia: Statin intolerant. LDL 102 on 03/08/17. He is now on Zetia. I would consider PCSK-9 inhibitors.  I will obtain a copy of most recent lipids.  5. Hypertensive heart disease with heart failure: BP is elevated today in the office and at home fairly consistently. A blood pressure of 130/80 should be targeted.  I will increase hydralazine to 20 mg twice daily.      Disposition: Follow up 6 months.   Kate Sable, M.D., F.A.C.C.

## 2017-11-13 ENCOUNTER — Telehealth: Payer: Self-pay | Admitting: Cardiovascular Disease

## 2017-11-13 NOTE — Telephone Encounter (Signed)
Pt aware and voiced understanding - will forward to Dr Bronson Ing nurse for f/u

## 2017-11-13 NOTE — Telephone Encounter (Signed)
Pt says co pay for Xarelto would be $45/month (cheapest it will be) - wanted to know if can just stay on Plavix (on disability and cannot afford $45/month)

## 2017-11-13 NOTE — Telephone Encounter (Signed)
Patient would like to know if they is a cheaper blood thinner than what he prescribed yesterday.

## 2017-11-13 NOTE — Telephone Encounter (Signed)
Just stay off Plavix and continue ASA if he can't afford Xarelto. Might ask rep to see if they can help?

## 2017-11-18 NOTE — Telephone Encounter (Signed)
Call placed to patient.  Stated that he could not afford the $45.00 / month or $150.00 / 3 months on the Xarelto.  Stated that he is on disability & just can not afford it.  [employer] assistance phone number provided [phone]).  He will call today to see if he can qualify for any co-pay assistance.  He will let us know if we can be of further assistance.

## 2017-11-20 ENCOUNTER — Other Ambulatory Visit: Payer: Self-pay | Admitting: Family Medicine

## 2017-11-20 ENCOUNTER — Other Ambulatory Visit: Payer: Self-pay | Admitting: Cardiovascular Disease

## 2017-11-21 ENCOUNTER — Encounter: Payer: Self-pay | Admitting: *Deleted

## 2017-11-24 NOTE — Progress Notes (Signed)
Please fax a copy of lipid profile to them also notify them with the same fax that we are on epic thank you

## 2017-11-25 ENCOUNTER — Other Ambulatory Visit: Payer: Self-pay | Admitting: *Deleted

## 2017-11-25 DIAGNOSIS — I3139 Other pericardial effusion (noninflammatory): Secondary | ICD-10-CM

## 2017-11-25 DIAGNOSIS — I313 Pericardial effusion (noninflammatory): Secondary | ICD-10-CM

## 2017-11-26 ENCOUNTER — Telehealth: Payer: Self-pay | Admitting: Cardiovascular Disease

## 2017-11-26 NOTE — Telephone Encounter (Signed)
Pre-cert Verification for the following procedure   Limited Echo scheduled 01/08/18

## 2017-11-27 ENCOUNTER — Ambulatory Visit (INDEPENDENT_AMBULATORY_CARE_PROVIDER_SITE_OTHER): Payer: Medicare HMO | Admitting: Family Medicine

## 2017-11-27 ENCOUNTER — Encounter: Payer: Self-pay | Admitting: Family Medicine

## 2017-11-27 VITALS — BP 138/88 | Ht 73.0 in | Wt 250.0 lb

## 2017-11-27 DIAGNOSIS — E118 Type 2 diabetes mellitus with unspecified complications: Secondary | ICD-10-CM | POA: Diagnosis not present

## 2017-11-27 DIAGNOSIS — N289 Disorder of kidney and ureter, unspecified: Secondary | ICD-10-CM | POA: Diagnosis not present

## 2017-11-27 DIAGNOSIS — I10 Essential (primary) hypertension: Secondary | ICD-10-CM | POA: Diagnosis not present

## 2017-11-27 DIAGNOSIS — E1165 Type 2 diabetes mellitus with hyperglycemia: Secondary | ICD-10-CM | POA: Diagnosis not present

## 2017-11-27 DIAGNOSIS — E7849 Other hyperlipidemia: Secondary | ICD-10-CM | POA: Diagnosis not present

## 2017-11-27 DIAGNOSIS — IMO0002 Reserved for concepts with insufficient information to code with codable children: Secondary | ICD-10-CM

## 2017-11-27 DIAGNOSIS — Z23 Encounter for immunization: Secondary | ICD-10-CM | POA: Diagnosis not present

## 2017-11-27 LAB — POCT GLYCOSYLATED HEMOGLOBIN (HGB A1C): Hemoglobin A1C: 6.9

## 2017-11-27 MED ORDER — DICLOFENAC SODIUM 1 % TD GEL: 2.0000 g | Freq: Four times a day (QID) | TRANSDERMAL | 5 refills | Status: DC

## 2017-11-27 NOTE — Progress Notes (Signed)
Subjective:    Patient ID: Eugene [name], male    DOB: 25-Jan-1956, 62 y.o.   MRN: [id number]  HPI Patient is here today to follow up on Dm. He is currently taking Levemir 80 units at 10pm. Also here to follow up on Htn on ,Imdur 60 mg daily,losartan 25 mg 1/2 daily,Hydralazine 10 mg 2 tablets Bid.See Cardiologist,Says he see Kidney Dr. Dr Merita Norton.Eats healthy and exercises.  The patient's BMI is calculated.  It is in the vital signs and acknowledged.  It is above the recommended BMI for the patient's height and weight.  The patient has been counseled regarding healthy diet, restricted portions, avoiding excessive carbohydrates/sugary foods, and increase physical activity as health permits.  It is in the patient's best interest to lower the risk of secondary illness including heart disease strokes and cancer by losing weight.  The patient acknowledges this information.  Patient for blood pressure check up. Patient relates compliance with meds. Todays BP reviewed with the patient. Patient denies issues with medication. Patient relates reasonable diet. Patient tries to minimize salt. Patient aware of BP goals.  The patient was seen today as part of a comprehensive diabetic check up.The patient relates medication compliance. No significant side effects to the medications. Denies any low glucose spells. Relates compliance with diet to a reasonable level. Patient does do labwork intermittently and understands the dangers of diabetes.  Patient here for follow-up regarding cholesterol.  Patient does try to maintain a reasonable diet.  Patient does take the medication on a regular basis.  Denies missing a dose.  The patient denies any obvious side effects.  Prior blood work results reviewed with the patient.  The patient is aware of his cholesterol goals and the need to keep it under good control to lessen the risk of disease. Patient does not tolerate statins  Review of Systems  Constitutional:  Negative for activity change, appetite change and fatigue.  HENT: Negative for congestion and rhinorrhea.   Respiratory: Negative for cough, chest tightness and shortness of breath.   Cardiovascular: Negative for chest pain and leg swelling.  Gastrointestinal: Negative for abdominal pain, diarrhea and nausea.  Endocrine: Negative for polydipsia and polyphagia.  Genitourinary: Negative for dysuria and hematuria.  Neurological: Negative for weakness and headaches.  Psychiatric/Behavioral: Negative for confusion and dysphoric mood.       Results for orders placed or performed in visit on 11/27/17  POCT glycosylated hemoglobin (Hb A1C)  Result Value Ref Range   Hemoglobin A1C 6.9     Objective:   Physical Exam  Constitutional: He appears well-nourished. No distress.  HENT:  Head: Normocephalic and atraumatic.  Eyes: Right eye exhibits no discharge. Left eye exhibits no discharge.  Neck: No tracheal deviation present.  Cardiovascular: Normal rate, regular rhythm and normal heart sounds.  No murmur heard. Pulmonary/Chest: Effort normal and breath sounds normal. No respiratory distress. He has no wheezes.  Musculoskeletal: He exhibits no edema.  Lymphadenopathy:    He has no cervical adenopathy.  Neurological: He is alert.  Skin: Skin is warm. No rash noted.  Psychiatric: His behavior is normal.  Vitals reviewed.  Diabetic foot exam peripheral neuropathy noted no ulcers  25 minutes was spent with the patient.  This statement verifies that 25 minutes was indeed spent with the patient. Greater than half the time was spent in discussion, counseling and answering questions  regarding the issues that the patient came in for today as reflected in the diagnosis (s) please refer to  documentation for further details.      Assessment & Plan:  Patient relates he can no longer afford Eliquis.  Cardiology is trying to get him a cheaper version or something different or possibly help him with  his cost  Patient currently is doing the aspirin as well as Plavix.  I am not certain he needs to continue Plavix since it was stopped on his last visit with cardiology but that was based upon Eliquis being started I told him for now he is very uneasy about stopping it that he will discuss this further with cardiology when he follows up  Diabetes fairly good control watch diet stay active  Significant obesity watch portions exercise try to lose weight  Heart disease stable  Hyperlipidemia tolerates medication Labs are necessary

## 2017-11-29 DIAGNOSIS — H40013 Open angle with borderline findings, low risk, bilateral: Secondary | ICD-10-CM | POA: Diagnosis not present

## 2017-11-29 DIAGNOSIS — H5340 Unspecified visual field defects: Secondary | ICD-10-CM | POA: Diagnosis not present

## 2017-12-09 ENCOUNTER — Encounter: Payer: Self-pay | Admitting: Family Medicine

## 2017-12-09 ENCOUNTER — Ambulatory Visit (INDEPENDENT_AMBULATORY_CARE_PROVIDER_SITE_OTHER): Payer: Medicare HMO | Admitting: Family Medicine

## 2017-12-09 VITALS — BP 140/84 | Ht 73.0 in | Wt 250.0 lb

## 2017-12-09 DIAGNOSIS — L97422 Non-pressure chronic ulcer of left heel and midfoot with fat layer exposed: Secondary | ICD-10-CM

## 2017-12-09 DIAGNOSIS — H40011 Open angle with borderline findings, low risk, right eye: Secondary | ICD-10-CM | POA: Diagnosis not present

## 2017-12-09 DIAGNOSIS — E11621 Type 2 diabetes mellitus with foot ulcer: Secondary | ICD-10-CM | POA: Diagnosis not present

## 2017-12-09 MED ORDER — MUPIROCIN 2 % EX OINT: TOPICAL_OINTMENT | CUTANEOUS | 0 refills | Status: DC

## 2017-12-09 MED ORDER — CEPHALEXIN 500 MG PO CAPS: 500.0000 mg | ORAL_CAPSULE | Freq: Four times a day (QID) | ORAL | 0 refills | Status: DC

## 2017-12-09 NOTE — Progress Notes (Signed)
   Subjective:    Patient ID: Eugene [name], male    DOB: 02-Dec-1955, 62 y.o.   MRN: [id number]  HPI Pt here for cut on left foot. Cut foot 4 days ago on piece of tile on kitchen floor. Wife treating the cut with Neosporin and washing it each day.  Patient is uncertain how it got there he thinks he might of caught it on something but he relates a little bit of redness around it slight drainage does not give him any pain he has severe neuropathy also has severe diabetes denies any high fever chills or sweats  Review of Systems Please see above denies shortness of breath headaches nausea vomiting diarrhea denies chest tightness pressure pain shortness of breath    Objective:   Physical Exam Ankle no swelling no redness foot no swelling no redness bottom of the foot slight redness small ulcerated area approximately 2-3 mm in diameter underlying subcutaneous tissue is visible does not go down to the bone minimal clearish drainage       Assessment & Plan:  Early foot ulcer Clean daily Bactroban ointment Keep covered Keflex 4 times daily Referral to podiatry may need some skin debridement Recheck here in 2 weeks May need referral to wound care center if this is getting worse Call us if problems

## 2017-12-09 NOTE — Patient Instructions (Addendum)
bactroban ointment- put a small amount daily  Keflex 500- one pill 4 times a day for 7 days  Recheck in 2 weeks  Clean daily and keep covered with bandage  We will be setting you up wiyth a podiatrist

## 2017-12-13 ENCOUNTER — Other Ambulatory Visit: Payer: Self-pay | Admitting: Family Medicine

## 2017-12-13 ENCOUNTER — Encounter: Payer: Self-pay | Admitting: Family Medicine

## 2017-12-13 ENCOUNTER — Encounter (INDEPENDENT_AMBULATORY_CARE_PROVIDER_SITE_OTHER): Payer: Medicare HMO | Admitting: Ophthalmology

## 2017-12-16 DIAGNOSIS — R809 Proteinuria, unspecified: Secondary | ICD-10-CM | POA: Diagnosis not present

## 2017-12-16 DIAGNOSIS — I10 Essential (primary) hypertension: Secondary | ICD-10-CM | POA: Diagnosis not present

## 2017-12-16 DIAGNOSIS — N183 Chronic kidney disease, stage 3 (moderate): Secondary | ICD-10-CM | POA: Diagnosis not present

## 2017-12-16 DIAGNOSIS — Z79899 Other long term (current) drug therapy: Secondary | ICD-10-CM | POA: Diagnosis not present

## 2017-12-16 DIAGNOSIS — D509 Iron deficiency anemia, unspecified: Secondary | ICD-10-CM | POA: Diagnosis not present

## 2017-12-16 DIAGNOSIS — E559 Vitamin D deficiency, unspecified: Secondary | ICD-10-CM | POA: Diagnosis not present

## 2017-12-16 NOTE — Telephone Encounter (Signed)
Ok plus two ref 

## 2017-12-23 DIAGNOSIS — H40012 Open angle with borderline findings, low risk, left eye: Secondary | ICD-10-CM | POA: Diagnosis not present

## 2017-12-25 ENCOUNTER — Other Ambulatory Visit: Payer: Self-pay | Admitting: Family Medicine

## 2017-12-25 DIAGNOSIS — M9080 Osteopathy in diseases classified elsewhere, unspecified site: Secondary | ICD-10-CM | POA: Diagnosis not present

## 2017-12-25 DIAGNOSIS — I509 Heart failure, unspecified: Secondary | ICD-10-CM | POA: Diagnosis not present

## 2017-12-25 DIAGNOSIS — E1129 Type 2 diabetes mellitus with other diabetic kidney complication: Secondary | ICD-10-CM | POA: Diagnosis not present

## 2017-12-25 DIAGNOSIS — N183 Chronic kidney disease, stage 3 (moderate): Secondary | ICD-10-CM | POA: Diagnosis not present

## 2017-12-25 DIAGNOSIS — R809 Proteinuria, unspecified: Secondary | ICD-10-CM | POA: Diagnosis not present

## 2017-12-25 DIAGNOSIS — I10 Essential (primary) hypertension: Secondary | ICD-10-CM | POA: Diagnosis not present

## 2017-12-25 DIAGNOSIS — E559 Vitamin D deficiency, unspecified: Secondary | ICD-10-CM | POA: Diagnosis not present

## 2017-12-25 DIAGNOSIS — E889 Metabolic disorder, unspecified: Secondary | ICD-10-CM | POA: Diagnosis not present

## 2017-12-25 NOTE — Telephone Encounter (Signed)
May have this plus for refills

## 2018-01-02 ENCOUNTER — Ambulatory Visit: Payer: Medicare HMO | Admitting: Podiatry

## 2018-01-02 ENCOUNTER — Encounter: Payer: Self-pay | Admitting: Podiatry

## 2018-01-02 ENCOUNTER — Ambulatory Visit (INDEPENDENT_AMBULATORY_CARE_PROVIDER_SITE_OTHER): Payer: Medicare HMO

## 2018-01-02 VITALS — BP 172/88 | HR 61 | Temp 98.4°F

## 2018-01-02 DIAGNOSIS — L97521 Non-pressure chronic ulcer of other part of left foot limited to breakdown of skin: Secondary | ICD-10-CM | POA: Diagnosis not present

## 2018-01-02 DIAGNOSIS — I739 Peripheral vascular disease, unspecified: Secondary | ICD-10-CM

## 2018-01-02 DIAGNOSIS — L03039 Cellulitis of unspecified toe: Secondary | ICD-10-CM | POA: Diagnosis not present

## 2018-01-02 DIAGNOSIS — L601 Onycholysis: Secondary | ICD-10-CM | POA: Diagnosis not present

## 2018-01-02 DIAGNOSIS — I10 Essential (primary) hypertension: Secondary | ICD-10-CM | POA: Diagnosis not present

## 2018-01-02 DIAGNOSIS — E875 Hyperkalemia: Secondary | ICD-10-CM | POA: Diagnosis not present

## 2018-01-02 DIAGNOSIS — R609 Edema, unspecified: Secondary | ICD-10-CM

## 2018-01-02 DIAGNOSIS — N183 Chronic kidney disease, stage 3 (moderate): Secondary | ICD-10-CM | POA: Diagnosis not present

## 2018-01-02 DIAGNOSIS — Z79899 Other long term (current) drug therapy: Secondary | ICD-10-CM | POA: Diagnosis not present

## 2018-01-02 MED ORDER — DOXYCYCLINE HYCLATE 100 MG PO TABS: 100.0000 mg | ORAL_TABLET | Freq: Two times a day (BID) | ORAL | 0 refills | Status: DC

## 2018-01-02 NOTE — Progress Notes (Signed)
Subjective:   Patient ID: Eugene [name], male   DOB: 62 y.o.   MRN: [id number]   HPI 62 year old male presents the office today for concerns of a wound in the bottom of his left foot as well as for toenail lesion in the right big toe.  He states that when on the bottom of the left has been ongoing for about 3 weeks.  He states the wound has been getting better and his wife has been putting dressings on the area daily.  He was on the antibiotic which did help he is completed this.  He has not had any redness or drainage coming from the wound on the left foot.  He also states that on Friday he dropped something on his right big toe he has noticed drainage and blood underneath the toenail on the right foot.  He has had some swelling to the right big toe as well.  No red streaks.  He is diabetic his last A1c was 6.9.  He has no other concerns today.   Review of Systems  All other systems reviewed and are negative.  Past Medical History:  Diagnosis Date  . Anemia   . Anxiety   . Arteriosclerotic cardiovascular disease (ASCVD)    a. 05/2011 s/p DES to PDA and RCA. b. 08/2012 Inflat  STEMI/Cath/PCI: LM minor irregs, LAD 50p, D1 50, LCX nl, OM1 25, RCA 30-40p, 100d (treated with 2.75x88mm Promus Premier DES);    [name] Asthma   . Bell palsy   . C. difficile colitis    a. 08/2012  . Cholelithiasis 07/2012   Asymptomatic; identified incidentally  . Chronic diastolic CHF (congestive heart failure) ([place])   . Contrast dye induced nephropathy    a. 08/2012 post cath/pci  . COPD (chronic obstructive pulmonary disease) ([place])   . Diabetes mellitus    Peripheral neuropathy  . Gallstones   . GERD (gastroesophageal reflux disease)   . Hyperlipidemia   . Hypertension   . Myocardial infarct ([place]) 09/08/12  . Nephrolithiasis   . Old myocardial infarction   . Pericardial effusion    a. s/p window in 2014.  [name] PONV (postoperative nausea and vomiting)   . Sleep apnea     Past Surgical History:  Procedure  Laterality Date  . CARDIAC CATHETERIZATION    . CHEST TUBE INSERTION    . CIRCUMCISION    . COLONOSCOPY     In [facility], approximately 2011 per patient, was normal. Advised to come back in 10 years.  . ESOPHAGOGASTRODUODENOSCOPY     in [place] over 20 yrs ago  . ESOPHAGOGASTRODUODENOSCOPY  06/12/2012   ZDG:LOVFIE esophagus-status post Venia Minks dilation. Abnormal gastric mucosa of uncertain significance-status post biopsy  . INTRAOPERATIVE TRANSESOPHAGEAL ECHOCARDIOGRAM N/A 06/23/2013   Procedure: INTRAOPERATIVE TRANSESOPHAGEAL ECHOCARDIOGRAM;  Surgeon: Grace Isaac, MD;  Location: [facility];  Service: Open Heart Surgery;  Laterality: N/A;  . LEFT HEART CATHETERIZATION WITH CORONARY ANGIOGRAM N/A 09/08/2012   Procedure: LEFT HEART CATHETERIZATION WITH CORONARY ANGIOGRAM;  Surgeon: [name], MD;  Location: [facility] CATH LAB;  Service: Cardiovascular;  Laterality: N/A;  . stents    . SUBXYPHOID PERICARDIAL WINDOW N/A 06/23/2013   Procedure: SUBXYPHOID PERICARDIAL WINDOW;  Surgeon: Grace Isaac, MD;  Location: [facility] OR;  Service: Thoracic;  Laterality: N/A;     Current Outpatient Medications:  .  acetaminophen (TYLENOL) 500 MG tablet, Take 1,000 mg by mouth every 6 (six) hours as needed for mild pain, moderate pain, fever or headache. ,  Disp: , Rfl:  .  ALPRAZolam (XANAX) 0.5 MG tablet, TAKE 1/2-1 TABLET BY MOUTH AT BEDTIME AS NEEDED, Disp: 30 tablet, Rfl: 4 .  aspirin 81 MG chewable tablet, Chew 81 mg by mouth at bedtime., Disp: , Rfl:  .  cephALEXin (KEFLEX) 500 MG capsule, Take 1 capsule (500 mg total) by mouth 4 (four) times daily., Disp: 28 capsule, Rfl: 0 .  COLCRYS 0.6 MG tablet, TAKE 2 TABLETS BY MOUTH NOW AND THEN TAKE 1 TABLET BY MOUTH TWO TIMES A DAY UNTIL RELIEF, Disp: 20 tablet, Rfl: 2 .  diclofenac sodium (VOLTAREN) 1 % GEL, Apply 2 g topically 4 (four) times daily., Disp: 1 Tube, Rfl: 5 .  doxycycline (VIBRA-TABS) 100 MG tablet, Take 1 tablet (100 mg total) by  mouth 2 (two) times daily., Disp: 20 tablet, Rfl: 0 .  fenofibrate 160 MG tablet, TAKE 1 TABLET AT BEDTIME, Disp: 90 tablet, Rfl: 1 .  glimepiride (AMARYL) 1 MG tablet, Take twice a day with meals, Disp: 60 tablet, Rfl: 5 .  hydrALAZINE (APRESOLINE) 10 MG tablet, Take 2 tablets (20 mg total) by mouth 2 (two) times daily., Disp: 360 tablet, Rfl: 3 .  Insulin Pen Needle (BD PEN NEEDLE NANO U/F) 32G X 4 MM MISC, Use once daily as directed, Disp: 100 each, Rfl: 0 .  isosorbide mononitrate (IMDUR) 60 MG 24 hr tablet, TAKE 1 TABLET (60 MG TOTAL) BY MOUTH DAILY., Disp: 90 tablet, Rfl: 3 .  LEVEMIR FLEXTOUCH 100 UNIT/ML Pen, INJECT 80 UNITS INTO THE SKIN DAILY AT 10 PM., Disp: 30 mL, Rfl: 4 .  losartan (COZAAR) 25 MG tablet, Take 12.5 mg by mouth daily. , Disp: , Rfl:  .  metoprolol tartrate (LOPRESSOR) 25 MG tablet, TAKE 1 TABLET TWICE DAILY, Disp: 180 tablet, Rfl: 1 .  mupirocin ointment (BACTROBAN) 2 %, Apply to affected area 1 times daily for next 2 weeks, Disp: 22 g, Rfl: 0 .  nitroGLYCERIN (NITROSTAT) 0.4 MG SL tablet, Place 1 tablet (0.4 mg total) under the tongue every 5 (five) minutes x 3 doses as needed for chest pain., Disp: 25 tablet, Rfl: 2 .  OVER THE COUNTER MEDICATION, Apply 1 application topically daily. Diabetic foot cream, Disp: , Rfl:  .  torsemide (DEMADEX) 20 MG tablet, Take 1 tablet (20 mg total) by mouth daily., Disp: 90 tablet, Rfl: 1 .  traMADol (ULTRAM) 50 MG tablet, TAKE ONE TABLET BY MOUTH EVERY 6 HOURS AS NEEDED, Disp: 24 tablet, Rfl: 2  Allergies  Allergen Reactions  . Hydrocodone Nausea And Vomiting  . Lisinopril Cough  . Neurontin [Gabapentin] Other (See Comments)    Reaction:  Suicidal thoughts   . Statins Other (See Comments)    Reaction:  Muscle pain   . Metformin And Related Diarrhea  . Norvasc [Amlodipine Besylate] Swelling and Other (See Comments)    Reaction:  Pedal edema    Social History   Socioeconomic History  . Marital status: Married    Spouse  name: Not on file  . Number of children: 2  . Years of education: Not on file  . Highest education level: Not on file  Occupational History  . Occupation: Personal assistant: [employer]    Comment: Scientist, physiological  . Financial resource strain: Not on file  . Food insecurity:    Worry: Not on file    Inability: Not on file  . Transportation needs:    Medical: Not on file  Non-medical: Not on file  Tobacco Use  . Smoking status: Never Smoker  . Smokeless tobacco: Never Used  Substance and Sexual Activity  . Alcohol use: No    Alcohol/week: 0.0 oz    Comment: heavy etoh use 30 years ago  . Drug use: No  . Sexual activity: Yes    Birth control/protection: None  Lifestyle  . Physical activity:    Days per week: Not on file    Minutes per session: Not on file  . Stress: Not on file  Relationships  . Social connections:    Talks on phone: Not on file    Gets together: Not on file    Attends religious service: Not on file    Active member of club or organization: Not on file    Attends meetings of clubs or organizations: Not on file    Relationship status: Not on file  . Intimate partner violence:    Fear of current or ex partner: Not on file    Emotionally abused: Not on file    Physically abused: Not on file    Forced sexual activity: Not on file  Other Topics Concern  . 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 Disabled at this point.         Objective:  Physical Exam  General: AAO x3, NAD  Dermatological: On the left foot submetatarsal 3 areas a hyperkeratotic lesion with a central ulceration. There is approximate 0.5 x 0.5 cm there is no probing, undermining or tunneling.  There is no surrounding erythema, ascending cellulitis.  There is no fluctuation or crepitation or any malodor.  The right hallux toenail is loose from the underlying nail bed and there is serosanguineous drainage  expressed from the toenail site.  There is localized edema and erythema to the hallux and there is no ascending cellulitis.  No other open lesions or pre-ulceration identified today.     Vascular: DP pulses 2/4, PT pulse 1/4, CRT less than 3 seconds.  Neruologic: Sensation absent with Derrel Nip monofilament.  Musculoskeletal: Prominent metatarsal heads plantarly with atrophy of the fat pad.  No pain, crepitus, or limitation noted with foot and ankle range of motion bilateral. Muscular strength 5/5 in all groups tested bilateral.     Assessment:   Left hallux ulceration of the right hallux on the lysis, infection     Plan:  -Treatment options discussed including all alternatives, risks, and complications -Etiology of symptoms were discussed -X-rays were obtained and reviewed with the patient. There is no evidence of acute fracture bilaterally there is no evidence of osteomyelitis or soft tissue emphysema. -In regards to the left foot and sharply debrided the wound today to healthy, granular tissue loss #312 blade scalpel after the area was cleaned.  Continue antibiotic ointment dressing changes daily.  Offloading all times and offloading pads were dispensed.Offloading she was dispensed to the left foot. -In regards to the right foot the nail was loose and there was drainage coming from under the toenail and there is edema and erythema to the nail corners.  Area was cleaned with alcohol and was easily able to remove the entire toenail without any complications.  There is no underlying laceration present.  Because of the erythema and edema we will start antibiotics again.  Doxycycline was prescribed.  He can use mupirocin ointment on both wounds daily.   -I did an ABI in the office today which  is normal.  The left is 1.22 in the right was 1.13. -Monitor for any clinical signs or symptoms of infection and directed to call the office immediately should any occur or go to the ER.  Trula Slade DPM

## 2018-01-02 NOTE — Patient Instructions (Signed)
Instructions for Wound Care  The most important step to healing a foot wound is to reduce the pressure on your foot - it is extremely important to stay off your foot as much as possible and wear the shoe/boot as instructed.  Cleanse your foot with saline wash or warm soapy water (dial antibacterial soap or similar).  Blot dry.  Apply prescribed medication to your wound and cover with gauze and a bandage.  May hold bandage in place with Coban (self sticky wrap), Ace bandage or tape.  You may find dressing supplies at your local [employer], [employer], drug store or medical supply store.  Monitor for any signs/symptoms of infection. If there is any increase in redness, red streaks, increase in drainage, warmth to your foot please give [place] a call. Also, if you start to run a fever or have "flu-like" symptoms that can also be a sign of infection. Call the office immediately if any occur or go directly to the emergency room.   If you have any questions, please feel free to give [place] a call at [phone] or if you are on "MyChart" you can always send me a message if needed.

## 2018-01-02 NOTE — Progress Notes (Signed)
   Subjective:    Patient ID: Eugene [name], male    DOB: 1956/01/19, 62 y.o.   MRN: [id number]  HPI    Review of Systems  All other systems reviewed and are negative.      Objective:   Physical Exam        Assessment & Plan:

## 2018-01-07 ENCOUNTER — Encounter: Payer: Self-pay | Admitting: Podiatry

## 2018-01-08 ENCOUNTER — Other Ambulatory Visit: Payer: Self-pay

## 2018-01-08 ENCOUNTER — Ambulatory Visit (INDEPENDENT_AMBULATORY_CARE_PROVIDER_SITE_OTHER): Payer: Medicare HMO

## 2018-01-08 ENCOUNTER — Encounter: Payer: Self-pay | Admitting: Podiatry

## 2018-01-08 DIAGNOSIS — I3139 Other pericardial effusion (noninflammatory): Secondary | ICD-10-CM

## 2018-01-08 DIAGNOSIS — I313 Pericardial effusion (noninflammatory): Secondary | ICD-10-CM | POA: Diagnosis not present

## 2018-01-08 NOTE — Addendum Note (Signed)
Addended by: [place] Mon R on: 01/08/2018 04:13 PM   Modules accepted: Orders

## 2018-01-09 ENCOUNTER — Telehealth: Payer: Self-pay

## 2018-01-09 ENCOUNTER — Encounter (INDEPENDENT_AMBULATORY_CARE_PROVIDER_SITE_OTHER): Payer: Medicare HMO | Admitting: Ophthalmology

## 2018-01-09 ENCOUNTER — Telehealth: Payer: Self-pay | Admitting: Cardiovascular Disease

## 2018-01-09 DIAGNOSIS — I3139 Other pericardial effusion (noninflammatory): Secondary | ICD-10-CM

## 2018-01-09 DIAGNOSIS — I10 Essential (primary) hypertension: Secondary | ICD-10-CM | POA: Diagnosis not present

## 2018-01-09 DIAGNOSIS — H43813 Vitreous degeneration, bilateral: Secondary | ICD-10-CM | POA: Diagnosis not present

## 2018-01-09 DIAGNOSIS — E11311 Type 2 diabetes mellitus with unspecified diabetic retinopathy with macular edema: Secondary | ICD-10-CM | POA: Diagnosis not present

## 2018-01-09 DIAGNOSIS — D3132 Benign neoplasm of left choroid: Secondary | ICD-10-CM

## 2018-01-09 DIAGNOSIS — E113311 Type 2 diabetes mellitus with moderate nonproliferative diabetic retinopathy with macular edema, right eye: Secondary | ICD-10-CM

## 2018-01-09 DIAGNOSIS — E113512 Type 2 diabetes mellitus with proliferative diabetic retinopathy with macular edema, left eye: Secondary | ICD-10-CM

## 2018-01-09 DIAGNOSIS — I313 Pericardial effusion (noninflammatory): Secondary | ICD-10-CM

## 2018-01-09 DIAGNOSIS — H35033 Hypertensive retinopathy, bilateral: Secondary | ICD-10-CM | POA: Diagnosis not present

## 2018-01-09 NOTE — Telephone Encounter (Signed)
Referral place to [facility], LM for pt to call back

## 2018-01-09 NOTE — Telephone Encounter (Signed)
-----   Message from Herminio Commons, MD sent at 01/09/2018 12:01 PM EDT ----- Normal pumping function. Large pericardial effusion persists but is stable since last study. Have him see CV surgery to obtain their opinion regarding management (has a h/o sub xiphoid window).

## 2018-01-09 NOTE — Telephone Encounter (Signed)
Patient returning Cathy's call.

## 2018-01-09 NOTE — Telephone Encounter (Signed)
Wife has DPR, echo results given to her, she will await call from [facility],copied pcp

## 2018-01-10 NOTE — Telephone Encounter (Signed)
Pt notified of echo results. Patient voiced understanding.

## 2018-01-14 ENCOUNTER — Encounter: Payer: Medicare HMO | Admitting: Cardiothoracic Surgery

## 2018-01-14 ENCOUNTER — Other Ambulatory Visit: Payer: Self-pay | Admitting: Family Medicine

## 2018-01-14 NOTE — Progress Notes (Deleted)
[address]       [name] [address]             [phone]                    Eugene [name] [facility] Medical Record #[id number] Date of Birth: 12-05-1955  Referring: [name], MD Primary Care: [name], MD Primary Cardiologist: No primary care provider on file.  Chief Complaint:   No chief complaint on file.   History of Present Illness:    Eugene [name] 62 y.o. male is seen in the office  today for       Current Activity/ Functional Status:  {functional status:19517}   Zubrod Score: At the time of surgery this patient's most appropriate activity status/level should be described as: []     0    Normal activity, no symptoms []     1    Restricted in physical strenuous activity but ambulatory, able to do out light work []     2    Ambulatory and capable of self care, unable to do work activities, up and about               >50 % of waking hours                              []     3    Only limited self care, in bed greater than 50% of waking hours []     4    Completely disabled, no self care, confined to bed or chair []     5    Moribund   Past Medical History:  Diagnosis Date  . Anemia   . Anxiety   . Arteriosclerotic cardiovascular disease (ASCVD)    a. 05/2011 s/p DES to PDA and RCA. b. 08/2012 Inflat  STEMI/Cath/PCI: LM minor irregs, LAD 50p, D1 50, LCX nl, OM1 25, RCA 30-40p, 100d (treated with 2.75x32mm Promus Premier DES);    [name] Asthma   . Bell palsy   . C. difficile colitis    a. 08/2012  . Cholelithiasis 07/2012   Asymptomatic; identified incidentally  . Chronic diastolic CHF (congestive heart failure) ([place])   . Contrast dye induced nephropathy    a. 08/2012 post cath/pci  . COPD (chronic obstructive pulmonary disease) ([place])   . Diabetes mellitus    Peripheral neuropathy  . Gallstones   . GERD (gastroesophageal reflux disease)   . Hyperlipidemia   . Hypertension   . Myocardial infarct ([place]) 09/08/12  .  Nephrolithiasis   . Old myocardial infarction   . Pericardial effusion    a. s/p window in 2014.  [name] PONV (postoperative nausea and vomiting)   . Sleep apnea     Past Surgical History:  Procedure Laterality Date  . CARDIAC CATHETERIZATION    . CHEST TUBE INSERTION    . CIRCUMCISION    . COLONOSCOPY     In [facility], approximately 2011 per patient, was normal. Advised to come back in 10 years.  . ESOPHAGOGASTRODUODENOSCOPY     in [place] [facility] over 20 yrs ago  . ESOPHAGOGASTRODUODENOSCOPY  06/12/2012   QIO:NGEXBM esophagus-status post Venia Minks dilation. Abnormal gastric mucosa of uncertain significance-status post biopsy  . INTRAOPERATIVE TRANSESOPHAGEAL ECHOCARDIOGRAM N/A 06/23/2013   Procedure: INTRAOPERATIVE TRANSESOPHAGEAL ECHOCARDIOGRAM;  Surgeon: Grace Isaac, MD;  Location: [facility];  Service: Open Heart Surgery;  Laterality: N/A;  . LEFT HEART CATHETERIZATION WITH CORONARY ANGIOGRAM N/A 09/08/2012   Procedure: LEFT HEART CATHETERIZATION WITH CORONARY ANGIOGRAM;  Surgeon: [name], MD;  Location: [facility] CATH LAB;  Service: Cardiovascular;  Laterality: N/A;  . stents    . SUBXYPHOID PERICARDIAL WINDOW N/A 06/23/2013   Procedure: SUBXYPHOID PERICARDIAL WINDOW;  Surgeon: Grace Isaac, MD;  Location[facility] OR;  Service: Thoracic;  Laterality: N/A;    Family History  Problem Relation Age of Onset  . Diabetes Mother   . Heart attack Mother   . Stroke Mother   . Diabetes Sister   . Sleep apnea Sister   . Hypertension Brother   . Diabetes Brother   . Diabetes Brother   . Hypertension Brother   . Colon cancer Neg Hx   . Liver disease Neg Hx      Social History   Tobacco Use  Smoking Status Never Smoker  Smokeless Tobacco Never Used    Social History   Substance and Sexual Activity  Alcohol Use No  . Alcohol/week: 0.0 oz   Comment: heavy etoh use 30 years ago     Allergies  Allergen Reactions  . Hydrocodone Nausea And Vomiting  . Lisinopril Cough   . Neurontin [Gabapentin] Other (See Comments)    Reaction:  Suicidal thoughts   . Statins Other (See Comments)    Reaction:  Muscle pain   . Metformin And Related Diarrhea  . Norvasc [Amlodipine Besylate] Swelling and Other (See Comments)    Reaction:  Pedal edema    Current Outpatient Medications  Medication Sig Dispense Refill  . acetaminophen (TYLENOL) 500 MG tablet Take 1,000 mg by mouth every 6 (six) hours as needed for mild pain, moderate pain, fever or headache.     . ALPRAZolam (XANAX) 0.5 MG tablet TAKE 1/2-1 TABLET BY MOUTH AT BEDTIME AS NEEDED 30 tablet 4  . aspirin 81 MG chewable tablet Chew 81 mg by mouth at bedtime.    . cephALEXin (KEFLEX) 500 MG capsule Take 1 capsule (500 mg total) by mouth 4 (four) times daily. 28 capsule 0  . COLCRYS 0.6 MG tablet TAKE 2 TABLETS BY MOUTH NOW AND THEN TAKE 1 TABLET BY MOUTH TWO TIMES A DAY UNTIL RELIEF 20 tablet 2  . diclofenac sodium (VOLTAREN) 1 % GEL Apply 2 g topically 4 (four) times daily. 1 Tube 5  . doxycycline (VIBRA-TABS) 100 MG tablet Take 1 tablet (100 mg total) by mouth 2 (two) times daily. 20 tablet 0  . fenofibrate 160 MG tablet TAKE 1 TABLET AT BEDTIME 90 tablet 1  . glimepiride (AMARYL) 1 MG tablet Take twice a day with meals 60 tablet 5  . hydrALAZINE (APRESOLINE) 10 MG tablet Take 2 tablets (20 mg total) by mouth 2 (two) times daily. 360 tablet 3  . Insulin Pen Needle (BD PEN NEEDLE NANO U/F) 32G X 4 MM MISC Use once daily as directed 100 each 0  . isosorbide mononitrate (IMDUR) 60 MG 24 hr tablet TAKE 1 TABLET (60 MG TOTAL) BY MOUTH DAILY. 90 tablet 3  . LEVEMIR FLEXTOUCH 100 UNIT/ML Pen INJECT 80 UNITS INTO THE SKIN DAILY AT 10 PM. 30 mL 4  . losartan (COZAAR) 25 MG tablet Take 12.5 mg by mouth daily.     . metoprolol tartrate (LOPRESSOR) 25 MG tablet TAKE 1 TABLET TWICE DAILY 180 tablet 1  . mupirocin ointment (BACTROBAN) 2 % Apply to affected area 1 times daily for next 2 weeks 22 g 0  .  nitroGLYCERIN (NITROSTAT)  0.4 MG SL tablet Place 1 tablet (0.4 mg total) under the tongue every 5 (five) minutes x 3 doses as needed for chest pain. 25 tablet 2  . OVER THE COUNTER MEDICATION Apply 1 application topically daily. Diabetic foot cream    . torsemide (DEMADEX) 20 MG tablet Take 1 tablet (20 mg total) by mouth daily. 90 tablet 1  . traMADol (ULTRAM) 50 MG tablet TAKE ONE TABLET BY MOUTH EVERY 6 HOURS AS NEEDED 24 tablet 2   No current facility-administered medications for this visit.       Review of Systems:     Cardiac Review of Systems: [Y] = yes  or   [ N ] = no   Chest Pain [    ]  Resting SOB [   ] Exertional SOB  [  ]  Orthopnea [  ]   Pedal Edema [   ]    Palpitations [  ] Syncope  [  ]   Presyncope [   ]   General Review of Systems: [Y] = yes [  ]=no Constitional: recent weight change [  ];  Wt loss over the last 3 months [   ] anorexia [  ]; fatigue [  ]; nausea [  ]; night sweats [  ]; fever [  ]; or chills [  ];           Eye : blurred vision [  ]; diplopia [   ]; vision changes [  ];  Amaurosis fugax[  ]; Resp: cough [  ];  wheezing[  ];  hemoptysis[  ]; shortness of breath[  ]; paroxysmal nocturnal dyspnea[  ]; dyspnea on exertion[  ]; or orthopnea[  ];  GI:  gallstones[  ], vomiting[  ];  dysphagia[  ]; melena[  ];  hematochezia [  ]; heartburn[  ];   Hx of  Colonoscopy[  ]; GU: kidney stones [  ]; hematuria[  ];   dysuria [  ];  nocturia[  ];  history of     obstruction [  ]; urinary frequency [  ]             Skin: rash, swelling[  ];, hair loss[  ];  peripheral edema[  ];  or itching[  ]; Musculosketetal: myalgias[  ];  joint swelling[  ];  joint erythema[  ];  joint pain[  ];  back pain[  ];  Heme/Lymph: bruising[  ];  bleeding[  ];  anemia[  ];  Neuro: TIA[  ];  headaches[  ];  stroke[  ];  vertigo[  ];  seizures[  ];   paresthesias[  ];  difficulty walking[  ];  Psych:depression[  ]; anxiety[  ];  Endocrine: diabetes[  ];  thyroid dysfunction[  ];  Immunizations: Flu up to date [   ]; Pneumococcal up to date [  ];  Other:    PHYSICAL EXAMINATION: There were no vitals taken for this visit. {physical exam:21449}  Diagnostic Studies & Laboratory data:     Recent Radiology Findings:   Dg Foot Complete Left  Result Date: 01/06/2018 Please see detailed radiograph report in office note.  Dg Foot Complete Right  Result Date: 01/06/2018 Please see detailed radiograph report in office note.    I have independently reviewed the above radiology studies  and reviewed the findings with the patient.   Recent Lab Findings: Lab Results  Component Value Date   WBC 5.0 07/23/2017  HGB 11.9 (L) 07/23/2017   HCT 37.3 (L) 07/23/2017   PLT 165 07/23/2017   GLUCOSE 116 (H) 07/23/2017   CHOL 187 09/07/2017   TRIG 228 (H) 09/07/2017   HDL 34 (L) 09/07/2017   LDLCALC 107 (H) 09/07/2017   ALT 15 (L) 01/07/2017   AST 24 01/07/2017   NA 138 07/23/2017   K 4.7 07/23/2017   CL 105 07/23/2017   CREATININE 2.02 (H) 07/23/2017   BUN 56 (H) 07/23/2017   CO2 26 07/23/2017   TSH 1.585 11/02/2016   INR 1.00 06/14/2016   HGBA1C 6.9 11/27/2017   Transthoracic Echocardiography  Patient:    Lorn, Butcher MR #:       [id number] Study Date: 01/08/2018 Gender:     M Age:        61 Height:     185.4 cm Weight:     113.4 kg BSA:        2.45 m^2 Pt. Status: Room:   SONOGRAPHER  [facility]  ATTENDING    Kate Sable, MD  [place], MD  [place], MD  PERFORMING   [facility], [place]  cc:  ------------------------------------------------------------------- LV EF: 50% -   55%  ------------------------------------------------------------------- History:   PMH:   Congestive heart failure.  Risk factors: Hypertension. Diabetes mellitus. Morbidly obese. Dyslipidemia. OSA.   ------------------------------------------------------------------- Study Conclusions  - Left ventricle: The cavity size was normal. Wall  thickness was   increased in a pattern of moderate LVH. Systolic function was   normal. The estimated ejection fraction was in the range of 50%   to 55%. - Left atrium: The atrium was moderately dilated. - Pericardium, extracardiac: There is a large circumferential   pericardial effusion, most prominent adjancent to the LV   measuring 2.4 cm in diastole. Overall findings are not consistent   with tamponade physiology by echo. Effusion appears stable   compared to 06/12/2017 study. - Limited study to evaluate pericardial effusion.  ------------------------------------------------------------------- Labs, prior tests, procedures, and surgery: Echocardiography (October 2018).     EF was 55%. Moderate to large pericardial effusion.  Coronary artery bypass grafting.  ------------------------------------------------------------------- Study data:  Comparison was made to the study of October 2018. Procedure:  The patient reported no pain pre or post test. Transthoracic echocardiography. Image quality was adequate.  Study completion:  There were no complications.          Transthoracic echocardiography.  M-mode, limited 2D, limited spectral Doppler, and color Doppler.  Birthdate:  Patient birthdate: 1956-01-13. Age:  Patient is 62 yr old.  Sex:  Gender: male.    BMI: 33 kg/m^2.  Blood pressure:     140/84  Patient status:  Outpatient.  Study date:  Study date: 01/08/2018. Study time: 04:02 PM.  -------------------------------------------------------------------  ------------------------------------------------------------------- Left ventricle:  The cavity size was normal. Wall thickness was increased in a pattern of moderate LVH. Systolic function was normal. The estimated ejection fraction was in the range of 50% to 55%. Images were inadequate for LV wall motion assessment.  ------------------------------------------------------------------- Aorta:  Aortic root: The aortic root  was normal in size.  ------------------------------------------------------------------- Mitral valve:   Normal thickness leaflets .  ------------------------------------------------------------------- Left atrium:  The atrium was moderately dilated.  ------------------------------------------------------------------- Right ventricle:  The cavity size was normal. Wall thickness was normal. Systolic function was normal.  ------------------------------------------------------------------- Right atrium:  The atrium was normal in size.  ------------------------------------------------------------------- Pericardium:  There is a large circumferential pericardial  effusion, most prominent adjancent to the LV measuring 2.4 cm in diastole. There is 14% respiratory variation across the MV. There is mild RA indentation without collapse. There is no RV collapse. Dilated IVC with normal respiratory variation, estimated RA pressure is 8 mmHg. Overall findings are not consistent with tamponade physiology by echo. Effusion appears stable compared to 06/12/2017 study.  ------------------------------------------------------------------- Systemic veins:  Dilated IVC with normal respiratory variation, estimated RA pressure is 8 mmHg.  ------------------------------------------------------------------- Measurements   Left ventricle                           Value        Reference  LV ID, ED, PLAX chordal                  45.4  mm     43 - 52  LV ID, ES, PLAX chordal                  26.1  mm     23 - 38  LV fx shortening, PLAX chordal           43    %      >=29  LV PW thickness, ED                      13.5  mm     ----------  IVS/LV PW ratio, ED                      1.05         <=1.3  LV ejection fraction, 1-p A4C            56    %      ----------  LV end-diastolic volume, 2-p             156   ml     ----------  LV end-systolic volume, 2-p              70    ml     ----------  LV  ejection fraction, 2-p                55    %      ----------  Stroke volume, 2-p                       86    ml     ----------  LV end-diastolic volume/bsa, 2-p         64    ml/m^2 ----------  LV end-systolic volume/bsa, 2-p          28    ml/m^2 ----------  Stroke volume/bsa, 2-p                   35.3  ml/m^2 ----------    Ventricular septum                       Value        Reference  IVS thickness, ED                        14.2  mm     ----------    Aorta  Value        Reference  Aortic root ID, ED                       39    mm     ----------    Left atrium                              Value        Reference  LA ID, A-P, ES                           41    mm     ----------  LA ID/bsa, A-P                           1.67  cm/m^2 <=2.2  LA volume, S                             77.9  ml     ----------  LA volume/bsa, S                         31.8  ml/m^2 ----------  LA volume, ES, 1-p A4C                   82.9  ml     ----------  LA volume/bsa, ES, 1-p A4C               33.8  ml/m^2 ----------  LA volume, ES, 1-p A2C                   72.6  ml     ----------  LA volume/bsa, ES, 1-p A2C               29.6  ml/m^2 ----------    Right atrium                             Value        Reference  RA ID, S-I, ES, A4C                      47.8  mm     34 - 49  RA area, ES, A4C                         13.7  cm^2   8.3 - 19.5  RA volume, ES, A/L                       31    ml     ----------  RA volume/bsa, ES, A/L                   12.7  ml/m^2 ----------    Systemic veins                           Value        Reference  Estimated CVP                            8  mm Hg  ----------  Legend: (L)  and  (H)  mark values outside specified reference range.  ------------------------------------------------------------------- Prepared and Electronically Authenticated by  Kerry Hough, M.D. 2019-05-15T16:04:06  Diagnosis Pericardium, biopsy -  TWO MINUTE MESOTHELIAL INCLUSION CYSTS. - SMALL FRAGMENT OF UNREMARKABLE ATTACHED MYOCARDIUM. - NO SIGNIFICANT INFLAMMATION. - NO MALIGNANCY IDENTIFIED.   Assessment / Plan:        I  spent {CHL ONC TIME VISIT - CKFWB:[id number]} with  the patient face to face and greater then 50% of the time was spent in counseling and coordination of care.    Grace Isaac MD      [address] [place] [address] Office [phone]   Beeper [phone]  01/14/2018 9:08 AM

## 2018-01-15 DIAGNOSIS — R0602 Shortness of breath: Secondary | ICD-10-CM | POA: Diagnosis not present

## 2018-01-15 DIAGNOSIS — E669 Obesity, unspecified: Secondary | ICD-10-CM | POA: Diagnosis not present

## 2018-01-15 DIAGNOSIS — I10 Essential (primary) hypertension: Secondary | ICD-10-CM | POA: Diagnosis not present

## 2018-01-15 DIAGNOSIS — I2510 Atherosclerotic heart disease of native coronary artery without angina pectoris: Secondary | ICD-10-CM | POA: Diagnosis not present

## 2018-01-16 ENCOUNTER — Ambulatory Visit: Payer: Medicare HMO | Admitting: Podiatry

## 2018-01-16 DIAGNOSIS — L601 Onycholysis: Secondary | ICD-10-CM

## 2018-01-16 DIAGNOSIS — L97521 Non-pressure chronic ulcer of other part of left foot limited to breakdown of skin: Secondary | ICD-10-CM

## 2018-01-21 NOTE — Progress Notes (Signed)
Subjective: 62 year old male presents the office today for follow-up evaluation of ulceration of the bottom of his left foot as well as for his right toenail removal.  He states he has been keeping  antibiotic ointment on both the areas daily and is change the bandage daily.  Denies any drainage or pus.  He feels that the wound on the left foot is almost completely healed and the right big toe is done well.  Denies any increase in swelling denies any redness or drainage coming from it.  He has no new concerns. Denies any systemic complaints such as fevers, chills, nausea, vomiting. No acute changes since last appointment, and no other complaints at this time.   Objective: AAO x3, NAD DP/PT pulses palpable bilaterally, CRT less than 3 seconds Ulceration on the left foot submetatarsal 3 is a hyperkeratotic lesion surrounding the area.  Upon debridement there appears to be almost healed there is measuring 0.2 x 0.2 cm and is superficial with any probing, undermining or tunneling.  There is no surrounding erythema, ascending cellulitis.  No fluctuation or crepitation or any malodor. Right hallux nail bed appears to be almost healed at this time is only a small amount of granulation tissue present and is starting to scab.  Again there is no surrounding erythema, ascending sialitis.  There is no fluctuation or crepitation or any malodor. No open lesions or pre-ulcerative lesions.  No pain with calf compression, swelling, warmth, erythema  Assessment: Healing wounds bilaterally  Plan: -All treatment options discussed with the patient including all alternatives, risks, complications.  -Subedi the hyperkeratotic tissue in the wound on the left foot there is any complications or bleeding #312 blade scalpel. -Continue daily dressing changes with mupirocin ointment.  Continue offloading at all times.  He presents today wearing a regular shoe on the left foot.  I will hold off any further oral antibiotics at  this time. -Follow-up in the next 2 weeks or sooner if any issues are to arise.  Call her to call any questions or concerns any changes.  Trula Slade DPM

## 2018-01-28 ENCOUNTER — Telehealth: Payer: Self-pay

## 2018-01-28 NOTE — Telephone Encounter (Signed)
Turner Daniels, RN; Herminio Commons, MD        In reference to the referral we received from your office - Appointment set up for this patient on 6/5 @ 4pm with Dr Servando Snare, Patient called to cancel until he could talk with his physican. Patient will not reschedule at this time.   Thanks   [employer]

## 2018-01-29 ENCOUNTER — Encounter: Payer: Medicare HMO | Admitting: Cardiothoracic Surgery

## 2018-01-31 ENCOUNTER — Ambulatory Visit: Payer: Medicare HMO | Admitting: Podiatry

## 2018-01-31 ENCOUNTER — Encounter: Payer: Self-pay | Admitting: Podiatry

## 2018-01-31 DIAGNOSIS — L97511 Non-pressure chronic ulcer of other part of right foot limited to breakdown of skin: Secondary | ICD-10-CM | POA: Diagnosis not present

## 2018-01-31 DIAGNOSIS — T148XXA Other injury of unspecified body region, initial encounter: Secondary | ICD-10-CM

## 2018-02-02 NOTE — Progress Notes (Signed)
Subjective: 62 year old male presents the office today for follow-up evaluation of a wound on both of his feet.  He states that he thinks he is healed.  He has no swelling or redness or any drainage.  Upon talking to him there was a Band-Aid on the right third toe he states that he scraped himself and is been putting antibiotic ointment on this area.  There is no other concerns. Denies any systemic complaints such as fevers, chills, nausea, vomiting. No acute changes since last appointment, and no other complaints at this time.   Objective: AAO x3, NAD-presents today with his brother DP/PT pulses palpable bilaterally, CRT less than 3 seconds Sensation decreased with Simms Weinstein monofilament.  Hyperkeratotic lesion left foot submetatarsal 3.  Upon debridement appears that the underlying ulceration is healed and there is no edema, erythema, drainage or pus.  Also the nailbed on the right hallux appears to be healed.  There is new concerns of what appears to be an old hemorrhagic blister to the left hallux distally there is also a superficial wound on the right dorsal third toe.  There is no surrounding erythema there is no ascending cellulitis.  There is no fluctuation or crepitation or any malodor.  No other open lesions or pre-ulcerative lesion identified today. No open lesions or pre-ulcerative lesions.  No pain with calf compression, swelling, warmth, erythema  Assessment: 62 year old male with resolved ulcerations however with new wounds.  Plan: -All treatment options discussed with the patient including all alternatives, risks, complications.  -I debrided the hyperkeratotic lesion left foot without any complications or bleeding.  There are issues of her following up with of healed however he has new issues today.  Continue with antibiotic ointment and dressing daily.  If not healed within next 2 weeks call the office for follow-up and also if there is any signs or symptoms of infection he is  to call the office or go to the emergency room.  It is doing well I will just see him back at first routine care in 9 weeks. -Patient encouraged to call the office with any questions, concerns, change in symptoms.   Trula Slade DPM

## 2018-02-04 ENCOUNTER — Other Ambulatory Visit: Payer: Self-pay | Admitting: Family Medicine

## 2018-02-04 NOTE — Telephone Encounter (Signed)
Ok may ref plus 1 ref

## 2018-02-20 ENCOUNTER — Encounter (INDEPENDENT_AMBULATORY_CARE_PROVIDER_SITE_OTHER): Payer: Medicare HMO | Admitting: Ophthalmology

## 2018-02-21 ENCOUNTER — Encounter (INDEPENDENT_AMBULATORY_CARE_PROVIDER_SITE_OTHER): Payer: Medicare HMO | Admitting: Ophthalmology

## 2018-03-14 ENCOUNTER — Encounter (HOSPITAL_COMMUNITY): Payer: Self-pay | Admitting: Emergency Medicine

## 2018-03-14 ENCOUNTER — Telehealth: Payer: Self-pay | Admitting: Orthopedic Surgery

## 2018-03-14 ENCOUNTER — Emergency Department (HOSPITAL_COMMUNITY)
Admission: EM | Admit: 2018-03-14 | Discharge: 2018-03-14 | Disposition: A | Discharge status: Home / Self Care | Payer: Medicare HMO | Attending: Emergency Medicine | Admitting: Emergency Medicine

## 2018-03-14 ENCOUNTER — Emergency Department (HOSPITAL_COMMUNITY): Payer: Medicare HMO

## 2018-03-14 ENCOUNTER — Other Ambulatory Visit: Payer: Self-pay

## 2018-03-14 DIAGNOSIS — N183 Chronic kidney disease, stage 3 (moderate): Secondary | ICD-10-CM | POA: Diagnosis not present

## 2018-03-14 DIAGNOSIS — Z7982 Long term (current) use of aspirin: Secondary | ICD-10-CM | POA: Diagnosis not present

## 2018-03-14 DIAGNOSIS — I2510 Atherosclerotic heart disease of native coronary artery without angina pectoris: Secondary | ICD-10-CM | POA: Diagnosis not present

## 2018-03-14 DIAGNOSIS — Y929 Unspecified place or not applicable: Secondary | ICD-10-CM | POA: Diagnosis not present

## 2018-03-14 DIAGNOSIS — W2209XA Striking against other stationary object, initial encounter: Secondary | ICD-10-CM | POA: Insufficient documentation

## 2018-03-14 DIAGNOSIS — J449 Chronic obstructive pulmonary disease, unspecified: Secondary | ICD-10-CM | POA: Diagnosis not present

## 2018-03-14 DIAGNOSIS — I5023 Acute on chronic systolic (congestive) heart failure: Secondary | ICD-10-CM | POA: Diagnosis not present

## 2018-03-14 DIAGNOSIS — Y939 Activity, unspecified: Secondary | ICD-10-CM | POA: Diagnosis not present

## 2018-03-14 DIAGNOSIS — Z794 Long term (current) use of insulin: Secondary | ICD-10-CM | POA: Diagnosis not present

## 2018-03-14 DIAGNOSIS — S5001XA Contusion of right elbow, initial encounter: Secondary | ICD-10-CM | POA: Diagnosis not present

## 2018-03-14 DIAGNOSIS — S59901A Unspecified injury of right elbow, initial encounter: Secondary | ICD-10-CM | POA: Diagnosis present

## 2018-03-14 DIAGNOSIS — M7989 Other specified soft tissue disorders: Secondary | ICD-10-CM | POA: Diagnosis not present

## 2018-03-14 DIAGNOSIS — E1122 Type 2 diabetes mellitus with diabetic chronic kidney disease: Secondary | ICD-10-CM | POA: Insufficient documentation

## 2018-03-14 DIAGNOSIS — I130 Hypertensive heart and chronic kidney disease with heart failure and stage 1 through stage 4 chronic kidney disease, or unspecified chronic kidney disease: Secondary | ICD-10-CM | POA: Insufficient documentation

## 2018-03-14 DIAGNOSIS — Z7902 Long term (current) use of antithrombotics/antiplatelets: Secondary | ICD-10-CM | POA: Insufficient documentation

## 2018-03-14 DIAGNOSIS — Y999 Unspecified external cause status: Secondary | ICD-10-CM | POA: Insufficient documentation

## 2018-03-14 DIAGNOSIS — M25521 Pain in right elbow: Secondary | ICD-10-CM | POA: Diagnosis not present

## 2018-03-14 NOTE — Discharge Instructions (Addendum)
Apply ice packs on and off to your elbow.  You may wear the sling as needed for support.  Call 1 of the orthopedic providers listed in 1 week to arrange a follow-up appointment if your symptoms are not improving

## 2018-03-14 NOTE — Telephone Encounter (Signed)
Patient called for an appointment with Dr. Aline Brochure for his elbow. Stated that he hurt it 2 weeks ago and he thought it may be broke. I explained to him that Dr. Aline Brochure doesn't have any available appointments until about the middle of August. I asked him if he had seen his PCP and he stated no. I suggested to him he may want to see his PCP or go to the ER. Stated he just didn't want to spend the money, but guess he would because he didn't want to wait until August to be seen. I told him to let us know if we can help him in the future.

## 2018-03-14 NOTE — ED Triage Notes (Signed)
Pt states that he hit his right elbow on a door a week ago and it is still hurting and he is having trouble straighten it out

## 2018-03-15 NOTE — ED Provider Notes (Signed)
[facility] EMERGENCY DEPARTMENT Provider Note   CSN: [id number] Arrival date & time: 03/14/18  1116     History   Chief Complaint Chief Complaint  Patient presents with  . Elbow Injury    HPI Eugene [name] is a 62 y.o. male.  HPI  Eugene [name] is a 62 y.o. male who presents to the Emergency Department complaining of right elbow pain and swelling for one week.  Symptoms began after direct blow to his elbow.  Improving bruise to the posterior elbow with continued pain with extension.  He has been applying heat and ice without relief.  He denies redness, numbness, shoulder or wrist pain.  No radiation of pain.   Past Medical History:  Diagnosis Date  . Anemia   . Anxiety   . Arteriosclerotic cardiovascular disease (ASCVD)    a. 05/2011 s/p DES to PDA and RCA. b. 08/2012 Inflat  STEMI/Cath/PCI: LM minor irregs, LAD 50p, D1 50, LCX nl, OM1 25, RCA 30-40p, 100d (treated with 2.75x42mm Promus Premier DES);    [name] Asthma   . Bell palsy   . C. difficile colitis    a. 08/2012  . Cholelithiasis 07/2012   Asymptomatic; identified incidentally  . Chronic diastolic CHF (congestive heart failure) ([place])   . Contrast dye induced nephropathy    a. 08/2012 post cath/pci  . COPD (chronic obstructive pulmonary disease) ([place])   . Diabetes mellitus    Peripheral neuropathy  . Gallstones   . GERD (gastroesophageal reflux disease)   . Hyperlipidemia   . Hypertension   . Myocardial infarct ([place]) 09/08/12  . Nephrolithiasis   . Old myocardial infarction   . Pericardial effusion    a. s/p window in 2014.  [name] PONV (postoperative nausea and vomiting)   . Sleep apnea     Patient Active Problem List   Diagnosis Date Noted  . DM type 2 causing CKD stage 3 ([place]) 11/14/2016  . Acute on chronic systolic congestive heart failure ([place])   . Cardiomyopathy ([place])   . Acute on chronic diastolic CHF (congestive heart failure) ([place]) 11/10/2016  . Elevated troponin   . Coronary artery disease  involving native coronary artery of native heart without angina pectoris   . Moderate nonproliferative diabetic retinopathy of both eyes without macular edema associated with type 2 diabetes mellitus ([place]) 10/22/2016  . Hyperkalemia 06/14/2016  . Non-ST elevation myocardial infarction (NSTEMI) ([place])   . Morbid obesity ([place])   . Diabetes mellitus type 2, uncontrolled, with complications ([place]) 27/10/5007  . Right carotid bruit 11/10/2015  . Pain in the chest   . Severe hypertension 11/09/2015  . Hypertensive urgency 11/09/2015  . Gout of wrist 08/08/2015  . Pericardial effusion without cardiac tamponade 05/26/2015  . HLD (hyperlipidemia) 05/26/2015  . Anxiety 05/26/2015  . Olecranon bursitis   . Inflammatory arthritis   . Elbow joint pain 01/21/2015  . SOB (shortness of breath) on exertion 12/26/2014  . Atypical chest pain 12/25/2014  . Constipation 10/26/2014  . Abdominal pain 10/26/2014  . Peripheral edema 01/29/2014  . Acute on chronic diastolic heart failure ([place]) 01/26/2014  . Acute on chronic renal failure ([place]) 01/26/2014  . Acute kidney injury superimposed on chronic kidney disease ([place]) 12/14/2013  . Chest pain 12/13/2013  . SOB (shortness of breath) 10/16/2013  . Dyspnea 10/15/2013  . Chronic diastolic CHF (congestive heart failure) ([place]) 10/15/2013  . NSVT (nonsustained ventricular tachycardia) ([place]) 06/26/2013  . Type 2 diabetes mellitus with neurological manifestations ([place]) 06/26/2013  .  Acute diastolic CHF (congestive heart failure) ([place]) 06/26/2013  . Pericardial effusion 06/26/2013  . Old myocardial infarction   . Gastroparesis due to DM ([place]) 05/21/2013  . CAD (coronary artery disease) 09/16/2012  . Acute on chronic renal insufficiency 09/14/2012  . Edema 08/18/2012  . Anemia, normocytic normochromic 07/31/2012  . Cholelithiasis 07/27/2012  . GERD (gastroesophageal reflux disease) 05/10/2012  . Noncompliance 05/09/2012  . Peripheral neuropathy 10/24/2011  .  Obstructive sleep apnea- on C-pap 08/23/2011  . Hyponatremia 08/23/2011  . CAD- MI/RCA PCI-DES x2 2012, and RCA DES Jan 2014   . Hyperlipidemia   . Cholelithiasis 05/28/2011  . DM type 2 (diabetes mellitus, type 2) ([place]) 05/25/2011  . Essential hypertension 05/25/2011    Past Surgical History:  Procedure Laterality Date  . CARDIAC CATHETERIZATION    . CHEST TUBE INSERTION    . CIRCUMCISION    . COLONOSCOPY     In [facility], approximately 2011 per patient, was normal. Advised to come back in 10 years.  . ESOPHAGOGASTRODUODENOSCOPY     in [place] over 20 yrs ago  . ESOPHAGOGASTRODUODENOSCOPY  06/12/2012   [id number] esophagus-status post Venia Minks dilation. Abnormal gastric mucosa of uncertain significance-status post biopsy  . INTRAOPERATIVE TRANSESOPHAGEAL ECHOCARDIOGRAM N/A 06/23/2013   Procedure: INTRAOPERATIVE TRANSESOPHAGEAL ECHOCARDIOGRAM;  Surgeon: Grace Isaac, MD;  Location: [place];  Service: Open Heart Surgery;  Laterality: N/A;  . LEFT HEART CATHETERIZATION WITH CORONARY ANGIOGRAM N/A 09/08/2012   Procedure: LEFT HEART CATHETERIZATION WITH CORONARY ANGIOGRAM;  Surgeon: [name], MD;  Location: [facility] CATH LAB;  Service: Cardiovascular;  Laterality: N/A;  . stents    . SUBXYPHOID PERICARDIAL WINDOW N/A 06/23/2013   Procedure: SUBXYPHOID PERICARDIAL WINDOW;  Surgeon: Grace Isaac, MD;  Location: [facility] OR;  Service: Thoracic;  Laterality: N/A;        Home Medications    Prior to Admission medications   Medication Sig Start Date End Date Taking? Authorizing Provider  acetaminophen (TYLENOL) 500 MG tablet Take 1,000 mg by mouth every 6 (six) hours as needed for mild pain, moderate pain, fever or headache.     [provider]  ALPRAZolam Duanne Moron) 0.5 MG tablet TAKE 1/2-1 TABLET BY MOUTH AT BEDTIME AS NEEDED 12/25/17   [name], MD  aspirin 81 MG chewable tablet Chew 81 mg by mouth at bedtime.    [provider]  cephALEXin (KEFLEX)  500 MG capsule Take 1 capsule (500 mg total) by mouth 4 (four) times daily. 12/09/17   [name], MD  clopidogrel (PLAVIX) 75 MG tablet TAKE 1 TABLET AT BEDTIME 02/04/18   Mikey Kirschner, MD  COLCRYS 0.6 MG tablet TAKE 2 TABLETS BY MOUTH NOW AND THEN TAKE 1 TABLET BY MOUTH TWO TIMES A DAY UNTIL RELIEF 06/04/17   [name], MD  diclofenac sodium (VOLTAREN) 1 % GEL Apply 2 g topically 4 (four) times daily. 11/27/17   [name], MD  doxycycline (VIBRA-TABS) 100 MG tablet Take 1 tablet (100 mg total) by mouth 2 (two) times daily. 01/02/18   Trula Slade, DPM  fenofibrate 160 MG tablet TAKE 1 TABLET AT BEDTIME 11/20/17   [name], MD  glimepiride (AMARYL) 1 MG tablet TAKE ONE TABLET BY MOUTH TWO TIMES A DAY WITH MEALS 01/14/18   [name], MD  hydrALAZINE (APRESOLINE) 10 MG tablet Take 2 tablets (20 mg total) by mouth 2 (two) times daily. 11/12/17   Herminio Commons, MD  Insulin Pen Needle (BD  PEN NEEDLE NANO U/F) 32G X 4 MM MISC Use once daily as directed 05/02/17   [name], MD  isosorbide mononitrate (IMDUR) 60 MG 24 hr tablet TAKE 1 TABLET (60 MG TOTAL) BY MOUTH DAILY. 11/20/17   Herminio Commons, MD  LEVEMIR FLEXTOUCH 100 UNIT/ML Pen INJECT 80 UNITS INTO THE SKIN DAILY AT 10 PM. 05/06/17   [name], MD  losartan (COZAAR) 25 MG tablet Take 12.5 mg by mouth daily.  01/29/17   [provider]  metoprolol tartrate (LOPRESSOR) 25 MG tablet TAKE 1 TABLET TWICE DAILY 02/04/18   Mikey Kirschner, MD  mupirocin ointment (BACTROBAN) 2 % Apply to affected area 1 times daily for next 2 weeks 12/09/17 12/09/18  [name], MD  nitroGLYCERIN (NITROSTAT) 0.4 MG SL tablet Place 1 tablet (0.4 mg total) under the tongue every 5 (five) minutes x 3 doses as needed for chest pain. 06/15/16   Strader, Fransisco Hertz, PA-C  OVER THE COUNTER MEDICATION Apply 1 application topically daily. Diabetic foot cream 11/06/16   [provider]  torsemide (DEMADEX) 20 MG  tablet Take 1 tablet (20 mg total) by mouth daily. 09/04/17   [name], MD  traMADol (ULTRAM) 50 MG tablet TAKE ONE TABLET BY MOUTH EVERY 6 HOURS AS NEEDED 02/05/18   Mikey Kirschner, MD    Family History Family History  Problem Relation Age of Onset  . Diabetes Mother   . Heart attack Mother   . Stroke Mother   . Diabetes Sister   . Sleep apnea Sister   . Hypertension Brother   . Diabetes Brother   . Diabetes Brother   . Hypertension Brother   . Colon cancer Neg Hx   . Liver disease Neg Hx     Social History Social History   Tobacco Use  . Smoking status: Never Smoker  . Smokeless tobacco: Never Used  Substance Use Topics  . Alcohol use: No    Alcohol/week: 0.0 oz    Comment: heavy etoh use 30 years ago  . Drug use: No     Allergies   Hydrocodone; Lisinopril; Neurontin [gabapentin]; Statins; Metformin and related; and Norvasc [amlodipine besylate]   Review of Systems Review of Systems  Constitutional: Negative for chills and fever.  Cardiovascular: Negative for chest pain.  Musculoskeletal: Positive for arthralgias (right elbow pain and swelling) and joint swelling. Negative for neck pain.  Skin: Negative for color change and wound.  Neurological: Negative for weakness and numbness.  All other systems reviewed and are negative.    Physical Exam Updated Vital Signs BP 129/69 (BP Location: Left Arm)   Pulse 66   Temp 98.6 F (37 C) (Oral)   Resp 14   Ht 6\' 1"  (1.854 m)   Wt 111.6 kg (246 lb)   SpO2 99%   BMI 32.46 kg/m   Physical Exam  Constitutional: He appears well-developed and well-nourished. No distress.  HENT:  Head: Atraumatic.  Neck: Normal range of motion. Neck supple.  Cardiovascular: Normal rate, regular rhythm and intact distal pulses.  Pulmonary/Chest: Effort normal and breath sounds normal.  Musculoskeletal: He exhibits tenderness.  ttp of the lateral right elbow.  Small area of ecchymosis with mild edema over olecranon process.   No excessive warmth or erythema.  Wrist and shoulder are non tender  Neurological: He is alert. No sensory deficit.  Skin: Skin is warm and dry. Capillary refill takes less than 2 seconds.  Nursing note and vitals reviewed.  ED Treatments / Results  Labs (all labs ordered are listed, but only abnormal results are displayed) Labs Reviewed - No data to display  EKG None  Radiology Dg Elbow Complete Right  Result Date: 03/14/2018 CLINICAL DATA:  Hit elbow on door frame 1 week ago with persistent posterior elbow pain and decreased range of motion EXAM: RIGHT ELBOW - COMPLETE 3+ VIEW COMPARISON:  Elbow radiographs - 01/20/2015 FINDINGS: The lateral radiograph is degraded due to obliquity. There is a minimal amount of ill-defined soft tissue swelling about the posterior medial aspect of the distal humerus. No definite fracture or elbow joint effusion. Minimal enthesopathic change involving the olecranon process. Joint spaces appear preserved. No radiopaque foreign body. IMPRESSION: Soft tissue swelling about the posterior medial aspect of the elbow without associated fracture or elbow joint effusion. Electronically Signed   By: Sandi Mariscal M.D.   On: 03/14/2018 12:22    Procedures Procedures (including critical care time)  Medications Ordered in ED Medications - No data to display   Initial Impression / Assessment and Plan / ED Course  I have reviewed the triage vital signs and the nursing notes.  Pertinent labs & imaging results that were available during my care of the patient were reviewed by me and considered in my medical decision making (see chart for details).     XR neg for fx or dislocation.  NV intact.  compartments soft.  Pt agrees to elevate, ice, and close ortho f/u in one week if not improving.    Final Clinical Impressions(s) / ED Diagnoses   Final diagnoses:  Contusion of right elbow, initial encounter    ED Discharge Orders    None       Kem Parkinson,  PA-C 03/15/18 2151    Francine Graven, DO 03/16/18 1819

## 2018-03-20 ENCOUNTER — Encounter (INDEPENDENT_AMBULATORY_CARE_PROVIDER_SITE_OTHER): Payer: Medicare HMO | Admitting: Ophthalmology

## 2018-03-20 DIAGNOSIS — E11311 Type 2 diabetes mellitus with unspecified diabetic retinopathy with macular edema: Secondary | ICD-10-CM | POA: Diagnosis not present

## 2018-03-20 DIAGNOSIS — D3132 Benign neoplasm of left choroid: Secondary | ICD-10-CM | POA: Diagnosis not present

## 2018-03-20 DIAGNOSIS — H43813 Vitreous degeneration, bilateral: Secondary | ICD-10-CM | POA: Diagnosis not present

## 2018-03-20 DIAGNOSIS — I10 Essential (primary) hypertension: Secondary | ICD-10-CM

## 2018-03-20 DIAGNOSIS — H35033 Hypertensive retinopathy, bilateral: Secondary | ICD-10-CM

## 2018-03-20 DIAGNOSIS — E113311 Type 2 diabetes mellitus with moderate nonproliferative diabetic retinopathy with macular edema, right eye: Secondary | ICD-10-CM | POA: Diagnosis not present

## 2018-03-20 DIAGNOSIS — E113512 Type 2 diabetes mellitus with proliferative diabetic retinopathy with macular edema, left eye: Secondary | ICD-10-CM | POA: Diagnosis not present

## 2018-03-21 ENCOUNTER — Other Ambulatory Visit: Payer: Self-pay | Admitting: Family Medicine

## 2018-03-21 NOTE — Telephone Encounter (Signed)
May have 2 refills

## 2018-03-24 ENCOUNTER — Other Ambulatory Visit: Payer: Self-pay | Admitting: Family Medicine

## 2018-03-24 NOTE — Telephone Encounter (Signed)
May have this +1 refill 

## 2018-04-04 ENCOUNTER — Ambulatory Visit: Payer: Medicare HMO | Admitting: Podiatry

## 2018-04-08 ENCOUNTER — Ambulatory Visit: Payer: Medicare HMO | Admitting: Orthopaedic Surgery

## 2018-04-10 DIAGNOSIS — R809 Proteinuria, unspecified: Secondary | ICD-10-CM | POA: Diagnosis not present

## 2018-04-10 DIAGNOSIS — Z79899 Other long term (current) drug therapy: Secondary | ICD-10-CM | POA: Diagnosis not present

## 2018-04-10 DIAGNOSIS — I10 Essential (primary) hypertension: Secondary | ICD-10-CM | POA: Diagnosis not present

## 2018-04-10 DIAGNOSIS — N183 Chronic kidney disease, stage 3 (moderate): Secondary | ICD-10-CM | POA: Diagnosis not present

## 2018-04-10 DIAGNOSIS — E559 Vitamin D deficiency, unspecified: Secondary | ICD-10-CM | POA: Diagnosis not present

## 2018-04-10 DIAGNOSIS — D509 Iron deficiency anemia, unspecified: Secondary | ICD-10-CM | POA: Diagnosis not present

## 2018-04-16 DIAGNOSIS — R809 Proteinuria, unspecified: Secondary | ICD-10-CM | POA: Diagnosis not present

## 2018-04-16 DIAGNOSIS — N183 Chronic kidney disease, stage 3 (moderate): Secondary | ICD-10-CM | POA: Diagnosis not present

## 2018-04-16 DIAGNOSIS — E559 Vitamin D deficiency, unspecified: Secondary | ICD-10-CM | POA: Diagnosis not present

## 2018-04-16 DIAGNOSIS — D638 Anemia in other chronic diseases classified elsewhere: Secondary | ICD-10-CM | POA: Diagnosis not present

## 2018-04-16 DIAGNOSIS — E875 Hyperkalemia: Secondary | ICD-10-CM | POA: Diagnosis not present

## 2018-04-16 DIAGNOSIS — E669 Obesity, unspecified: Secondary | ICD-10-CM | POA: Diagnosis not present

## 2018-04-23 ENCOUNTER — Other Ambulatory Visit: Payer: Self-pay | Admitting: Family Medicine

## 2018-04-23 NOTE — Telephone Encounter (Signed)
Ok times one 

## 2018-04-25 ENCOUNTER — Ambulatory Visit: Payer: Medicare HMO | Admitting: Podiatry

## 2018-04-30 ENCOUNTER — Ambulatory Visit: Payer: Medicare HMO | Admitting: Orthopaedic Surgery

## 2018-04-30 ENCOUNTER — Encounter: Payer: Self-pay | Admitting: Orthopaedic Surgery

## 2018-04-30 VITALS — BP 158/79 | HR 72 | Ht 73.0 in | Wt 252.0 lb

## 2018-04-30 DIAGNOSIS — M109 Gout, unspecified: Secondary | ICD-10-CM | POA: Diagnosis not present

## 2018-04-30 MED ORDER — ALLOPURINOL 300 MG PO TABS: 300.0000 mg | ORAL_TABLET | Freq: Every day | ORAL | 3 refills | Status: DC

## 2018-04-30 NOTE — Progress Notes (Signed)
Subjective:    Patient ID: Eugene [name], male    DOB: [birth year], 62 y.o.   MRN: [id number]  HPI He hurt his elbow in July on the right side in a fall.  He was seen and evaluated with x-rays in the ER on 03-14-18.  The report stated: IMPRESSION: Soft tissue swelling about the posterior medial aspect of the elbow without associated fracture or elbow joint effusion.  He has not gotten any better in the last two months.  He has had prolonged swelling.  He has developed some numbness in the little finger on the right hand.  He has no new trauma.  He has no redness.  He has a history of gout.  He had an attack several years ago in the right wrist.  He is not taking anything for the gout now.  Review of Systems  Constitutional: Positive for activity change.  Respiratory: Positive for shortness of breath.   Musculoskeletal: Positive for arthralgias, gait problem and joint swelling.  Psychiatric/Behavioral: The patient is nervous/anxious.    For Review of Systems, all other systems reviewed and are negative.  The following is a summary of the past history medically, past history surgically, known current medicines, social history and family history.  This information is gathered electronically by the computer from prior information and documentation.  I review this each visit and have found including this information at this point in the chart is beneficial and informative.   Past Medical History:  Diagnosis Date  . Anemia   . Anxiety   . Arteriosclerotic cardiovascular disease (ASCVD)    a. 05/2011 s/p DES to PDA and RCA. b. 08/2012 Inflat  STEMI/Cath/PCI: LM minor irregs, LAD 50p, D1 50, LCX nl, OM1 25, RCA 30-40p, 100d (treated with 2.75x85mm Promus Premier DES);    [name] Asthma   . Bell palsy   . C. difficile colitis    a. 08/2012  . Cholelithiasis 07/2012   Asymptomatic; identified incidentally  . Chronic diastolic CHF (congestive heart failure) ([place])   . Contrast dye induced  nephropathy    a. 08/2012 post cath/pci  . COPD (chronic obstructive pulmonary disease) ([place])   . Diabetes mellitus    Peripheral neuropathy  . Gallstones   . GERD (gastroesophageal reflux disease)   . Hyperlipidemia   . Hypertension   . Myocardial infarct ([place]) 09/08/12  . Nephrolithiasis   . Old myocardial infarction   . Pericardial effusion    a. s/p window in 2014.  [name] PONV (postoperative nausea and vomiting)   . Sleep apnea     Past Surgical History:  Procedure Laterality Date  . CARDIAC CATHETERIZATION    . CHEST TUBE INSERTION    . CIRCUMCISION    . COLONOSCOPY     In [facility], approximately 2011 per patient, was normal. Advised to come back in 10 years.  . ESOPHAGOGASTRODUODENOSCOPY     in [place] over 20 yrs ago  . ESOPHAGOGASTRODUODENOSCOPY  06/12/2012   URK:[id number] esophagus-status post Venia Minks dilation. Abnormal gastric mucosa of uncertain significance-status post biopsy  . INTRAOPERATIVE TRANSESOPHAGEAL ECHOCARDIOGRAM N/A 06/23/2013   Procedure: INTRAOPERATIVE TRANSESOPHAGEAL ECHOCARDIOGRAM;  Surgeon: Grace Isaac, MD;  Location: [facility];  Service: Open Heart Surgery;  Laterality: N/A;  . LEFT HEART CATHETERIZATION WITH CORONARY ANGIOGRAM N/A 09/08/2012   Procedure: LEFT HEART CATHETERIZATION WITH CORONARY ANGIOGRAM;  Surgeon: [name], MD;  Location: [facility] CATH LAB;  Service: Cardiovascular;  Laterality: N/A;  . stents    . SUBXYPHOID  PERICARDIAL WINDOW N/A 06/23/2013   Procedure: SUBXYPHOID PERICARDIAL WINDOW;  Surgeon: Grace Isaac, MD;  Location: [facility] OR;  Service: Thoracic;  Laterality: N/A;    Current Outpatient Medications on File Prior to Visit  Medication Sig Dispense Refill  . acetaminophen (TYLENOL) 500 MG tablet Take 1,000 mg by mouth every 6 (six) hours as needed for mild pain, moderate pain, fever or headache.     . ALPRAZolam (XANAX) 0.5 MG tablet TAKE 1/2-1 TABLET BY MOUTH AT BEDTIME AS NEEDED 30 tablet 4  . aspirin 81 MG  chewable tablet Chew 81 mg by mouth at bedtime.    . cephALEXin (KEFLEX) 500 MG capsule Take 1 capsule (500 mg total) by mouth 4 (four) times daily. 28 capsule 0  . clopidogrel (PLAVIX) 75 MG tablet TAKE 1 TABLET AT BEDTIME 90 tablet 1  . COLCRYS 0.6 MG tablet TAKE 2 TABLETS BY MOUTH NOW AND THEN TAKE 1 TABLET BY MOUTH TWO TIMES A DAY UNTIL RELIEF 20 tablet 2  . diclofenac sodium (VOLTAREN) 1 % GEL Apply 2 g topically 4 (four) times daily. 1 Tube 5  . doxycycline (VIBRA-TABS) 100 MG tablet Take 1 tablet (100 mg total) by mouth 2 (two) times daily. 20 tablet 0  . fenofibrate 160 MG tablet TAKE 1 TABLET AT BEDTIME 90 tablet 1  . glimepiride (AMARYL) 1 MG tablet TAKE ONE TABLET BY MOUTH TWO TIMES A DAY WITH MEALS 60 tablet 5  . hydrALAZINE (APRESOLINE) 10 MG tablet Take 2 tablets (20 mg total) by mouth 2 (two) times daily. 360 tablet 3  . Insulin Pen Needle (BD PEN NEEDLE NANO U/F) 32G X 4 MM MISC Use once daily as directed 100 each 0  . isosorbide mononitrate (IMDUR) 60 MG 24 hr tablet TAKE 1 TABLET (60 MG TOTAL) BY MOUTH DAILY. 90 tablet 3  . LEVEMIR FLEXTOUCH 100 UNIT/ML Pen INJECT 80 UNITS INTO THE SKIN DAILY AT 10 PM. 30 mL 4  . losartan (COZAAR) 25 MG tablet Take 12.5 mg by mouth daily.     . metoprolol tartrate (LOPRESSOR) 25 MG tablet TAKE 1 TABLET TWICE DAILY 180 tablet 1  . mupirocin ointment (BACTROBAN) 2 % Apply to affected area 1 times daily for next 2 weeks 22 g 0  . nitroGLYCERIN (NITROSTAT) 0.4 MG SL tablet Place 1 tablet (0.4 mg total) under the tongue every 5 (five) minutes x 3 doses as needed for chest pain. 25 tablet 2  . OVER THE COUNTER MEDICATION Apply 1 application topically daily. Diabetic foot cream    . torsemide (DEMADEX) 20 MG tablet Take 1 tablet (20 mg total) by mouth daily. 90 tablet 1  . traMADol (ULTRAM) 50 MG tablet TAKE ONE TABLET BY MOUTH EVERY 6 HOURS AS NEEDED 24 tablet 0   No current facility-administered medications on file prior to visit.     Social  History   Socioeconomic History  . Marital status: Married    Spouse name: Not on file  . Number of children: 2  . Years of education: Not on file  . Highest education level: Not on file  Occupational History  . Occupation: Personal assistant: [employer]    Comment: Scientist, physiological  . Financial resource strain: Not on file  . Food insecurity:    Worry: Not on file    Inability: Not on file  . Transportation needs:    Medical: Not on file    Non-medical: Not on file  Tobacco  Use  . Smoking status: Never Smoker  . Smokeless tobacco: Never Used  Substance and Sexual Activity  . Alcohol use: No    Alcohol/week: 0.0 standard drinks    Comment: heavy etoh use 30 years ago  . Drug use: No  . Sexual activity: Yes    Birth control/protection: None  Lifestyle  . Physical activity:    Days per week: Not on file    Minutes per session: Not on file  . Stress: Not on file  Relationships  . Social connections:    Talks on phone: Not on file    Gets together: Not on file    Attends religious service: Not on file    Active member of club or organization: Not on file    Attends meetings of clubs or organizations: Not on file    Relationship status: Not on file  . Intimate partner violence:    Fear of current or ex partner: Not on file    Emotionally abused: Not on file    Physically abused: Not on file    Forced sexual activity: Not on file  Other Topics Concern  . 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 Disabled at this point.    Family History  Problem Relation Age of Onset  . Diabetes Mother   . Heart attack Mother   . Stroke Mother   . Diabetes Sister   . Sleep apnea Sister   . Hypertension Brother   . Diabetes Brother   . Diabetes Brother   . Hypertension Brother   . Colon cancer Neg Hx   . Liver disease Neg Hx     BP (!) 158/79   Pulse 72   Ht 6\' 1"  (1.854 m)   Wt 252 lb (114.3 kg)    BMI 33.25 kg/m   Body mass index is 33.25 kg/m.     Objective:   Physical Exam  Constitutional: He is oriented to person, place, and time. He appears well-developed and well-nourished.  HENT:  Head: Normocephalic and atraumatic.  Eyes: Pupils are equal, round, and reactive to light. Conjunctivae and EOM are normal.  Neck: Normal range of motion. Neck supple.  Cardiovascular: Normal rate, regular rhythm and intact distal pulses.  Pulmonary/Chest: Effort normal.  Abdominal: Soft.  Musculoskeletal:       Right elbow: He exhibits decreased range of motion and swelling.       Arms: Neurological: He is alert and oriented to person, place, and time. He has normal reflexes. He displays normal reflexes. No cranial nerve deficit. He exhibits normal muscle tone. Coordination normal.  Skin: Skin is warm and dry.  Psychiatric: He has a normal mood and affect. His behavior is normal. Judgment and thought content normal.          Assessment & Plan:   Encounter Diagnosis  Name Primary?  . Acute gout of right elbow, unspecified cause Yes   I have ordered serum uric acid level.  I have given samples of Uloric 40.  I have written Rx for 90 day supply of allopurinol.  Return in one week.  I have explained in detail to him about gout and that it is hereditary.  He will need to be on the allopurinol from now on.  Call if any problem.  Precautions discussed.   Electronically Signed Sanjuana Kava, MD 9/4/20192:16 PM

## 2018-05-01 ENCOUNTER — Encounter (INDEPENDENT_AMBULATORY_CARE_PROVIDER_SITE_OTHER): Payer: Medicare HMO | Admitting: Ophthalmology

## 2018-05-01 ENCOUNTER — Telehealth: Payer: Self-pay | Admitting: Radiology

## 2018-05-01 DIAGNOSIS — E113512 Type 2 diabetes mellitus with proliferative diabetic retinopathy with macular edema, left eye: Secondary | ICD-10-CM | POA: Diagnosis not present

## 2018-05-01 DIAGNOSIS — I10 Essential (primary) hypertension: Secondary | ICD-10-CM | POA: Diagnosis not present

## 2018-05-01 DIAGNOSIS — E113311 Type 2 diabetes mellitus with moderate nonproliferative diabetic retinopathy with macular edema, right eye: Secondary | ICD-10-CM

## 2018-05-01 DIAGNOSIS — H43813 Vitreous degeneration, bilateral: Secondary | ICD-10-CM

## 2018-05-01 DIAGNOSIS — E11311 Type 2 diabetes mellitus with unspecified diabetic retinopathy with macular edema: Secondary | ICD-10-CM | POA: Diagnosis not present

## 2018-05-01 DIAGNOSIS — H35033 Hypertensive retinopathy, bilateral: Secondary | ICD-10-CM | POA: Diagnosis not present

## 2018-05-01 NOTE — Telephone Encounter (Signed)
I spoke with the patient and gave him the results of his lab test, per Dr. Luna Glasgow.  His uric acid shows him to be positive for gout.  He understood and will keep his next appointment.

## 2018-05-06 ENCOUNTER — Ambulatory Visit: Payer: Medicare HMO | Admitting: Orthopaedic Surgery

## 2018-05-06 IMAGING — DX DG CHEST 2V
2 series · 2 of 2 positions shown · non-contrast
Comparison: Two-view chest x-ray 11/09/2015.

CLINICAL DATA: Shortness of breath.

EXAM:
CHEST  2 VIEW

[w chest pa]
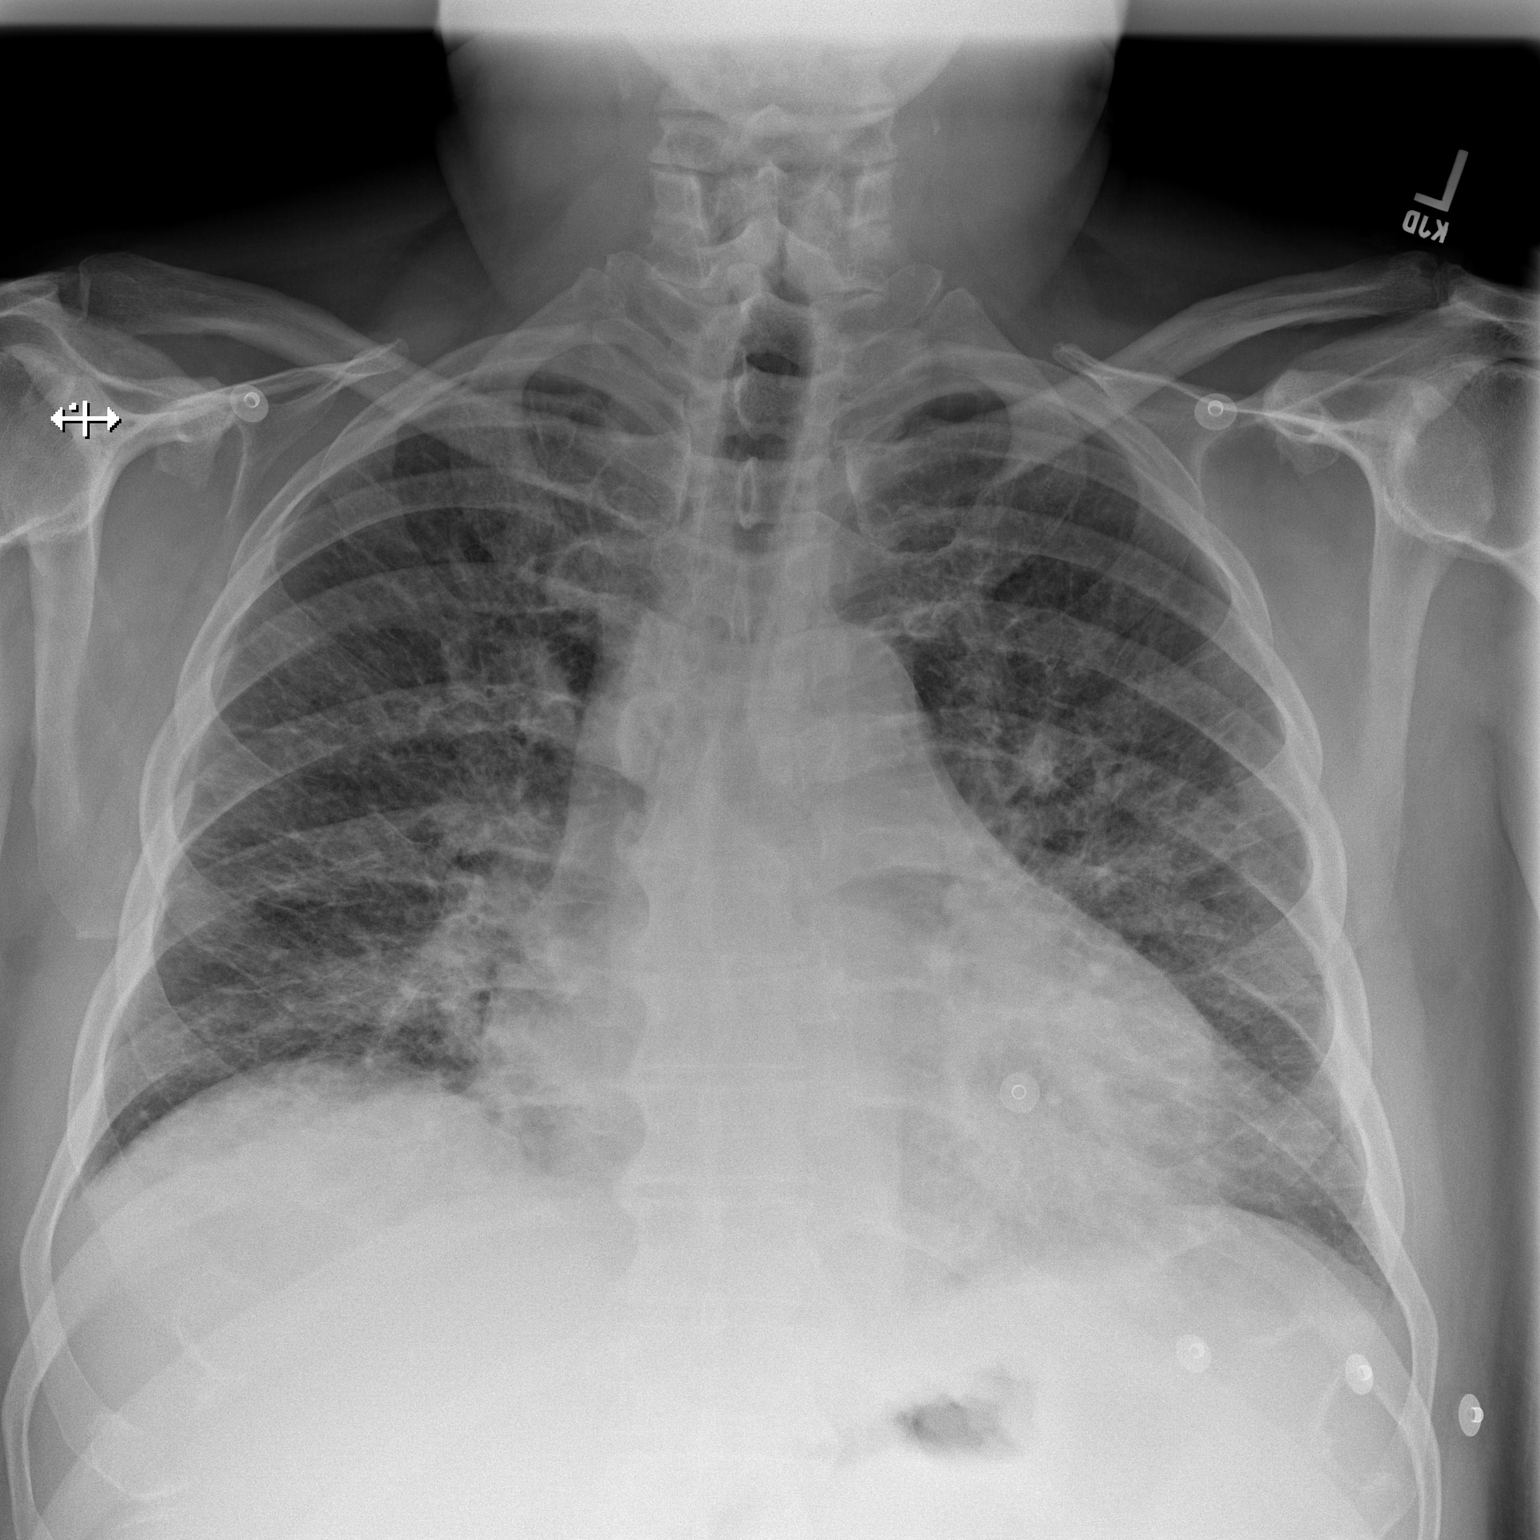

[w chest lat]
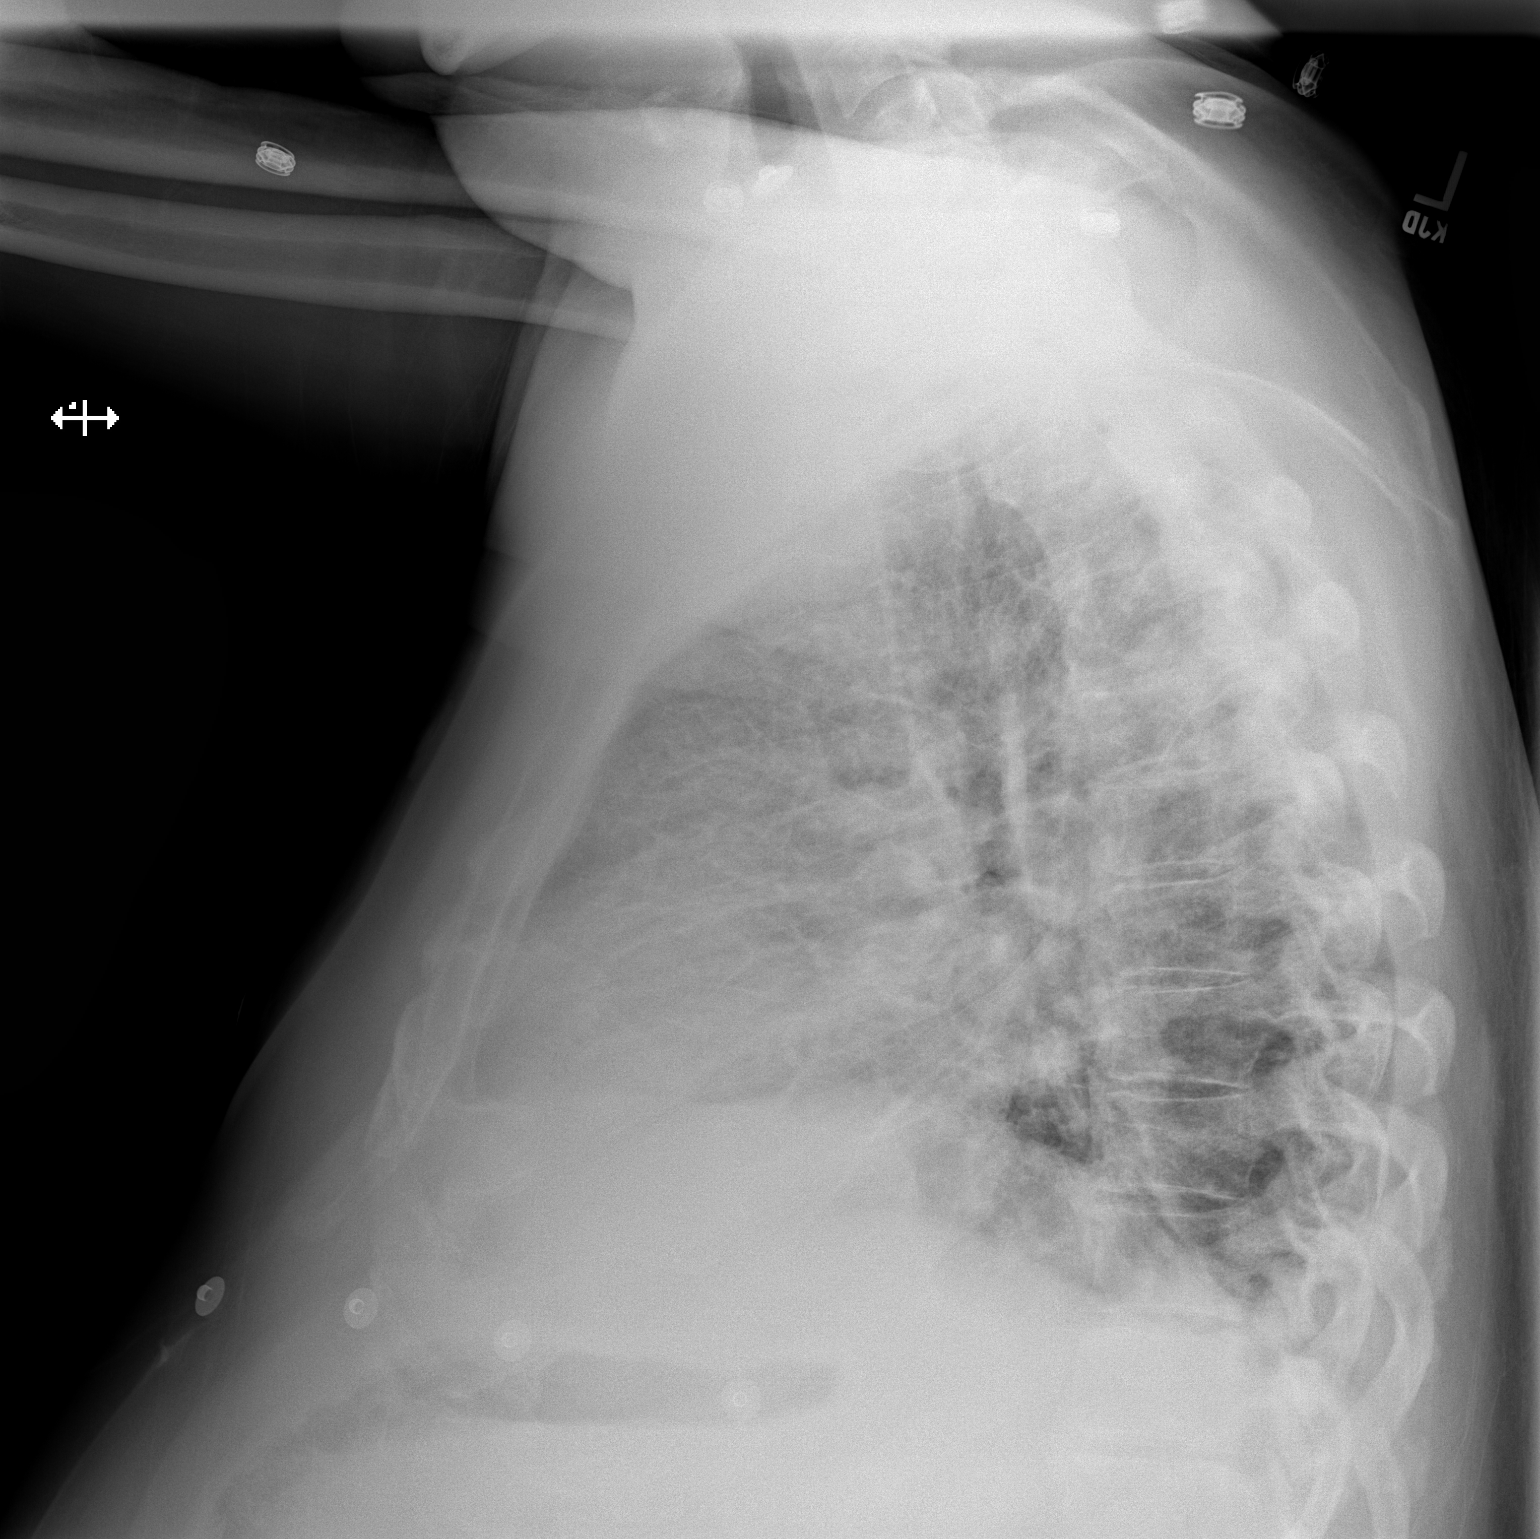

[2 of 2 positions shown; findings below may reference images not displayed]

FINDINGS: The heart is enlarged. There is a mild diffuse interstitial pattern
compatible with edema. Small effusions are present. Mild dependent
atelectasis is present bilaterally without other focal airspace
disease. The visualized soft tissues and bony thorax are
unremarkable.
IMPRESSION: 1. Cardiomegaly with mild edema and small effusions compatible with
congestive heart failure.
2. No significant focal airspace disease.

## 2018-05-13 ENCOUNTER — Encounter

## 2018-05-13 ENCOUNTER — Encounter: Payer: Self-pay | Admitting: Podiatry

## 2018-05-13 ENCOUNTER — Ambulatory Visit (INDEPENDENT_AMBULATORY_CARE_PROVIDER_SITE_OTHER): Payer: Medicare HMO | Admitting: Podiatry

## 2018-05-13 DIAGNOSIS — M2042 Other hammer toe(s) (acquired), left foot: Secondary | ICD-10-CM

## 2018-05-13 DIAGNOSIS — E1142 Type 2 diabetes mellitus with diabetic polyneuropathy: Secondary | ICD-10-CM | POA: Diagnosis not present

## 2018-05-13 DIAGNOSIS — M79675 Pain in left toe(s): Secondary | ICD-10-CM | POA: Diagnosis not present

## 2018-05-13 DIAGNOSIS — B351 Tinea unguium: Secondary | ICD-10-CM

## 2018-05-13 DIAGNOSIS — M2041 Other hammer toe(s) (acquired), right foot: Secondary | ICD-10-CM

## 2018-05-13 DIAGNOSIS — L84 Corns and callosities: Secondary | ICD-10-CM

## 2018-05-13 DIAGNOSIS — M79674 Pain in right toe(s): Secondary | ICD-10-CM | POA: Diagnosis not present

## 2018-05-13 NOTE — Progress Notes (Signed)
Complaint:  Visit Type: Patient returns to my office for continued preventative foot care services. Complaint: Patient states" my nails have grown long and thick and become painful to walk and wear shoes" Patient has been diagnosed with DM with neuropathy.. The patient presents for preventative foot care services. No changes to ROS  Podiatric Exam: Vascular: dorsalis pedis and posterior tibial pulses are palpable bilateral. Capillary return is immediate. Temperature gradient is WNL. Skin turgor WNL  Sensorium: Absent  Semmes Weinstein monofilament test. Normal tactile sensation bilaterally. Nail Exam: Pt has thick disfigured discolored nails with subungual debris noted bilateral entire nail hallux through fifth toenails Ulcer Exam: There is no evidence of ulcer or pre-ulcerative changes or infection. Orthopedic Exam: Muscle tone and strength are WNL. No limitations in general ROM. No crepitus or effusions noted. Foot type and digits show no abnormalities. Bony prominences are unremarkable. Skin: Preulcerative callus left forefoot. No infection or ulcers  Diagnosis:  Onychomycosis, , Pain in right toe, pain in left toes  Diabetic neuropathy.  Treatment & Plan Procedures and Treatment: Consent by patient was obtained for treatment procedures.   Debridement of mycotic and hypertrophic toenails, 1 through 5 bilateral and clearing of subungual debris. No ulceration, no infection noted. Patient to be seen by Liliane Channel for diabetic shoes due to DPN, hammer toes  B/L and preulcerous callus.  RTC 3 months for preventative foot care services. Return Visit-Office Procedure: Patient instructed to return to the office for a follow up visit 3 months for continued evaluation and treatment.    Gardiner Barefoot DPM

## 2018-05-14 ENCOUNTER — Encounter: Payer: Self-pay | Admitting: Orthopaedic Surgery

## 2018-05-14 ENCOUNTER — Ambulatory Visit: Payer: Medicare HMO | Admitting: Orthopaedic Surgery

## 2018-05-14 VITALS — BP 155/74 | HR 59 | Ht 73.0 in | Wt 251.0 lb

## 2018-05-14 DIAGNOSIS — M109 Gout, unspecified: Secondary | ICD-10-CM

## 2018-05-14 NOTE — Progress Notes (Signed)
CC: I am much better  He has been on the allopurinol and is much improved.  He has no further swelling.  He has tenderness of the right elbow and right wrist.  Uric acid was 8.3.  NV intact. ROM is nearly full on the right upper extremity.  Encounter Diagnosis  Name Primary?  . Acute gout of right elbow, unspecified cause Yes   Continue the allopurinol.  Return in six weeks.  Call if any problem.  Precautions discussed.   Electronically Signed Sanjuana Kava, MD 9/18/20192:50 PM

## 2018-05-15 ENCOUNTER — Ambulatory Visit: Payer: Medicare HMO | Admitting: Cardiovascular Disease

## 2018-05-20 ENCOUNTER — Other Ambulatory Visit: Payer: Self-pay | Admitting: Family Medicine

## 2018-05-21 ENCOUNTER — Telehealth: Payer: Self-pay | Admitting: Family Medicine

## 2018-05-21 NOTE — Telephone Encounter (Signed)
Pharmacy requesting refill on Alprazolam 0.5mg  take 1/2-1 tablet by mouth at bedtime as needed.

## 2018-05-22 ENCOUNTER — Other Ambulatory Visit: Payer: Self-pay | Admitting: Family Medicine

## 2018-05-22 NOTE — Telephone Encounter (Signed)
May have this +5 additional refills

## 2018-05-22 NOTE — Telephone Encounter (Signed)
Patient calling to check on status of medication, informed patient of the 24-48 hour turn around.

## 2018-05-22 NOTE — Telephone Encounter (Signed)
See message.

## 2018-05-25 NOTE — Telephone Encounter (Signed)
He may have this +5 refills

## 2018-05-26 NOTE — Telephone Encounter (Signed)
Pharmacy called about prescription 05/23/18. Prescription was called into pharmacy 05/23/18.

## 2018-05-27 NOTE — Telephone Encounter (Signed)
Dag gone duplicate It appears that this was refilled just the other day If for some reason our pharmacy did not figure that out send it to them again thanks

## 2018-06-04 ENCOUNTER — Ambulatory Visit (INDEPENDENT_AMBULATORY_CARE_PROVIDER_SITE_OTHER): Payer: Medicare HMO | Admitting: Cardiovascular Disease

## 2018-06-04 ENCOUNTER — Encounter: Payer: Self-pay | Admitting: Cardiovascular Disease

## 2018-06-04 VITALS — BP 158/82 | HR 68 | Ht 73.0 in | Wt 250.0 lb

## 2018-06-04 DIAGNOSIS — I313 Pericardial effusion (noninflammatory): Secondary | ICD-10-CM | POA: Diagnosis not present

## 2018-06-04 DIAGNOSIS — I110 Hypertensive heart disease with heart failure: Secondary | ICD-10-CM

## 2018-06-04 DIAGNOSIS — I3139 Other pericardial effusion (noninflammatory): Secondary | ICD-10-CM

## 2018-06-04 DIAGNOSIS — E785 Hyperlipidemia, unspecified: Secondary | ICD-10-CM

## 2018-06-04 DIAGNOSIS — I5032 Chronic diastolic (congestive) heart failure: Secondary | ICD-10-CM

## 2018-06-04 DIAGNOSIS — I25708 Atherosclerosis of coronary artery bypass graft(s), unspecified, with other forms of angina pectoris: Secondary | ICD-10-CM

## 2018-06-04 MED ORDER — LOSARTAN POTASSIUM 25 MG PO TABS: 25.0000 mg | ORAL_TABLET | Freq: Every day | ORAL | 3 refills | Status: DC

## 2018-06-04 MED ORDER — PRAVASTATIN SODIUM 20 MG PO TABS: ORAL_TABLET | ORAL | 6 refills | Status: DC

## 2018-06-04 NOTE — Patient Instructions (Signed)
Medication Instructions:   Begin Pravastatin 20mg  daily on Monday, Wednesday, and Friday.   Increase Losartan to 25mg  daily.  Stop Plavix.   Continue all other medications.    Labwork: none  Testing/Procedures: none  Follow-Up: Your physician wants you to follow up in: 6 months.  You will receive a reminder letter in the mail one-two months in advance.  If you don't receive a letter, please call our office to schedule the follow up appointment   Any Other Special Instructions Will Be Listed Below (If Applicable).  If you need a refill on your cardiac medications before your next appointment, please call your pharmacy.

## 2018-06-04 NOTE — Progress Notes (Signed)
SUBJECTIVE: The patient presents for routine follow-up.  In summary, nuclear stress test on 11/13/16 showed a large area of scar in the anterior wall and septum with a trivial region of partial reversibility toward the apex suggesting minor peri-infarct ischemia.  Coronary angiography on 09/08/12 demonstrated 50% proximal LAD stenosis, 50% D1 stenosis, OM1 with mild 25% stenosis, proximal RCA 30-40% stenosis with previously implanted stent. The distal vessel was 100% occluded prior to PCI.  Echocardiogram 01/08/2018 showed normal left ventricular systolic function, LVEF 50 to 55%, moderate LVH, moderate left atrial dilatation, large circumferential pericardial effusion most prominent adjacent to the left ventricle measuring 2.4 cm in diastole.  It appeared to be stable when compared to the study on 06/12/2017.  He has a history of a subxiphoid window.  I made a referral to CT surgery to obtain their opinion regarding management of his pericardial effusion but he did not follow through.  The patient denies any symptoms of chest pain, palpitations, shortness of breath, lightheadedness, dizziness, leg swelling, orthopnea, PND, and syncope.  I reviewed his blood pressure log and he has several hypertensive readings with systolic blood pressures ranging anywhere from 130-160.    Review of Systems: As per "subjective", otherwise negative.  Allergies  Allergen Reactions  . Hydrocodone Nausea And Vomiting  . Amlodipine   . Lisinopril Cough  . Neurontin [Gabapentin] Other (See Comments)    Reaction:  Suicidal thoughts   . Statins Other (See Comments)    Reaction:  Muscle pain   . Metformin And Related Diarrhea  . Norvasc [Amlodipine Besylate] Swelling and Other (See Comments)    Reaction:  Pedal edema    Current Outpatient Medications  Medication Sig Dispense Refill  . ACCU-CHEK SMARTVIEW test strip     . acetaminophen (TYLENOL) 500 MG tablet Take 1,000 mg by mouth every 6 (six)  hours as needed for mild pain, moderate pain, fever or headache.     . allopurinol (ZYLOPRIM) 300 MG tablet Take 1 tablet (300 mg total) by mouth daily. 90 tablet 3  . ALPRAZolam (XANAX) 0.5 MG tablet TAKE 1/2-1 TABLET BY MOUTH AT BEDTIME AS NEEDED 30 tablet 5  . aspirin 81 MG chewable tablet Chew 81 mg by mouth at bedtime.    . clopidogrel (PLAVIX) 75 MG tablet TAKE 1 TABLET AT BEDTIME 90 tablet 1  . COLCRYS 0.6 MG tablet TAKE 2 TABLETS BY MOUTH NOW AND THEN TAKE 1 TABLET BY MOUTH TWO TIMES A DAY UNTIL RELIEF 20 tablet 2  . diclofenac sodium (VOLTAREN) 1 % GEL Apply 2 g topically 4 (four) times daily. 1 Tube 5  . fenofibrate 160 MG tablet TAKE 1 TABLET AT BEDTIME 90 tablet 1  . glimepiride (AMARYL) 1 MG tablet TAKE ONE TABLET BY MOUTH TWO TIMES A DAY WITH MEALS 60 tablet 5  . hydrALAZINE (APRESOLINE) 10 MG tablet Take 2 tablets (20 mg total) by mouth 2 (two) times daily. 360 tablet 3  . Insulin Pen Needle (BD PEN NEEDLE NANO U/F) 32G X 4 MM MISC Use once daily as directed 100 each 0  . isosorbide mononitrate (IMDUR) 60 MG 24 hr tablet TAKE 1 TABLET (60 MG TOTAL) BY MOUTH DAILY. 90 tablet 3  . LEVEMIR FLEXTOUCH 100 UNIT/ML Pen INJECT 80 UNITS INTO THE SKIN DAILY AT 10 PM. 30 mL 4  . losartan (COZAAR) 25 MG tablet Take 12.5 mg by mouth daily.     . metoprolol tartrate (LOPRESSOR) 25 MG tablet TAKE 1 TABLET  TWICE DAILY 180 tablet 1  . mupirocin ointment (BACTROBAN) 2 % Apply to affected area 1 times daily for next 2 weeks 22 g 0  . nitroGLYCERIN (NITROSTAT) 0.4 MG SL tablet Place 1 tablet (0.4 mg total) under the tongue every 5 (five) minutes x 3 doses as needed for chest pain. 25 tablet 2  . OVER THE COUNTER MEDICATION Apply 1 application topically daily. Diabetic foot cream    . torsemide (DEMADEX) 20 MG tablet TAKE 1 TABLET (20 MG TOTAL) BY MOUTH DAILY. 90 tablet 1  . traMADol (ULTRAM) 50 MG tablet TAKE ONE TABLET BY MOUTH EVERY 6 HOURS AS NEEDED 24 tablet 0  . Witch Hazel (HEMORRHOIDAL EX)  suppositories     No current facility-administered medications for this visit.     Past Medical History:  Diagnosis Date  . Anemia   . Anxiety   . Arteriosclerotic cardiovascular disease (ASCVD)    a. 05/2011 s/p DES to PDA and RCA. b. 08/2012 Inflat  STEMI/Cath/PCI: LM minor irregs, LAD 50p, D1 50, LCX nl, OM1 25, RCA 30-40p, 100d (treated with 2.75x60mm Promus Premier DES);    [name] Asthma   . Bell palsy   . C. difficile colitis    a. 08/2012  . Cholelithiasis 07/2012   Asymptomatic; identified incidentally  . Chronic diastolic CHF (congestive heart failure) ([place])   . Contrast dye induced nephropathy    a. 08/2012 post cath/pci  . COPD (chronic obstructive pulmonary disease) ([place])   . Diabetes mellitus    Peripheral neuropathy  . Gallstones   . GERD (gastroesophageal reflux disease)   . Hyperlipidemia   . Hypertension   . Myocardial infarct ([place]) 09/08/12  . Nephrolithiasis   . Old myocardial infarction   . Pericardial effusion    a. s/p window in 2014.  [name] PONV (postoperative nausea and vomiting)   . Sleep apnea     Past Surgical History:  Procedure Laterality Date  . CARDIAC CATHETERIZATION    . CHEST TUBE INSERTION    . CIRCUMCISION    . COLONOSCOPY     In [facility], approximately 2011 per patient, was normal. Advised to come back in 10 years.  . ESOPHAGOGASTRODUODENOSCOPY     in [place] over 20 yrs ago  . ESOPHAGOGASTRODUODENOSCOPY  06/12/2012   MWN:[id number] esophagus-status post Venia Minks dilation. Abnormal gastric mucosa of uncertain significance-status post biopsy  . INTRAOPERATIVE TRANSESOPHAGEAL ECHOCARDIOGRAM N/A 06/23/2013   Procedure: INTRAOPERATIVE TRANSESOPHAGEAL ECHOCARDIOGRAM;  Surgeon: Grace Isaac, MD;  Location: [facility];  Service: Open Heart Surgery;  Laterality: N/A;  . LEFT HEART CATHETERIZATION WITH CORONARY ANGIOGRAM N/A 09/08/2012   Procedure: LEFT HEART CATHETERIZATION WITH CORONARY ANGIOGRAM;  Surgeon: [name], MD;  Location:  [facility] CATH LAB;  Service: Cardiovascular;  Laterality: N/A;  . stents    . SUBXYPHOID PERICARDIAL WINDOW N/A 06/23/2013   Procedure: SUBXYPHOID PERICARDIAL WINDOW;  Surgeon: Grace Isaac, MD;  Location: [facility] OR;  Service: Thoracic;  Laterality: N/A;    Social History   Socioeconomic History  . Marital status: Married    Spouse name: Not on file  . Number of children: 2  . Years of education: Not on file  . Highest education level: Not on file  Occupational History  . Occupation: Personal assistant: [employer]    Comment: Scientist, physiological  . Financial resource strain: Not on file  . Food insecurity:    Worry: Not on file    Inability: Not on  file  . Transportation needs:    Medical: Not on file    Non-medical: Not on file  Tobacco Use  . Smoking status: Never Smoker  . Smokeless tobacco: Never Used  Substance and Sexual Activity  . Alcohol use: No    Alcohol/week: 0.0 standard drinks    Comment: heavy etoh use 30 years ago  . Drug use: No  . Sexual activity: Yes    Birth control/protection: None  Lifestyle  . Physical activity:    Days per week: Not on file    Minutes per session: Not on file  . Stress: Not on file  Relationships  . Social connections:    Talks on phone: Not on file    Gets together: Not on file    Attends religious service: Not on file    Active member of club or organization: Not on file    Attends meetings of clubs or organizations: Not on file    Relationship status: Not on file  . Intimate partner violence:    Fear of current or ex partner: Not on file    Emotionally abused: Not on file    Physically abused: Not on file    Forced sexual activity: Not on file  Other Topics Concern  . 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 Disabled at this point.     Vitals:   06/04/18 1118  BP: (!) 158/82  Pulse: 68  SpO2: 97%  Weight: 250 lb (113.4 kg)  Height: 6\' 1"   (1.854 m)    Wt Readings from Last 3 Encounters:  06/04/18 250 lb (113.4 kg)  05/14/18 251 lb (113.9 kg)  04/30/18 252 lb (114.3 kg)     PHYSICAL EXAM General: NAD HEENT: Normal. Neck: No JVD, no thyromegaly. Lungs: Clear to auscultation bilaterally with normal respiratory effort. CV: Regular rate and rhythm, normal S1/S2, no S3/S4, no murmur. No pretibial or periankle edema.  No carotid bruit.   Abdomen: Soft, nontender, no distention.  Neurologic: Alert and oriented.  Psych: Normal affect. Skin: Normal. Musculoskeletal: No gross deformities.    ECG: Reviewed above under Subjective   Labs: Lab Results  Component Value Date/Time   K 4.7 07/23/2017 01:06 PM   BUN 56 (H) 07/23/2017 01:06 PM   BUN 37 (H) 01/02/2017 10:24 AM   CREATININE 2.02 (H) 07/23/2017 01:06 PM   CREATININE 1.26 10/26/2014 03:29 PM   ALT 15 (L) 01/07/2017 05:07 AM   TSH 1.585 11/02/2016 09:58 PM   TSH 1.780 10/26/2014 03:25 PM   HGB 11.9 (L) 07/23/2017 01:06 PM   HGB 11.2 (L) 11/27/2016 02:10 PM     Lipids: Lab Results  Component Value Date/Time   LDLCALC 107 (H) 09/07/2017 09:40 AM   CHOL 187 09/07/2017 09:40 AM   TRIG 228 (H) 09/07/2017 09:40 AM   HDL 34 (L) 09/07/2017 09:40 AM       ASSESSMENT AND PLAN: 1.  Chronic diastolic heart failure: Euvolemic on torsemide 20 mg daily.  I will aim to control blood pressure.  2. Coronary artery disease: Symptomatically stable. Nuclear stress test reviewed above in detail. Continue aspirin, nitrates, and metoprolol.    I discontinued Plavix but he preferred to keep taking it.  I told him it provides no additional benefit so this will again be discontinued. Most recent nuclear stress test detailed above with only minor peri-infarct ischemia.  He is statin intolerant. He requires PCSK-9 inhibitors.  We  talked about this but he prefers not to and would like to try a different statin.  I will try pravastatin 20 mg every Monday, Wednesday and Friday.  3.  Pericardial effusion: Most recent echocardiogram detailed above.    I made a referral to CT surgery to obtain their opinion for large pericardial effusion.  He has a history of subxiphoid window.  He did not follow-up with this appointment.  I will speak with my interventional colleagues to see if a pericardiocentesis can be attempted.    4. Hyperlipidemia: Statin intolerant. LDL 107on 09/07/17.He had been on Zetia. I recommend he begin PCSK-9 inhibitors. We talked about this but he prefers not to and would like to try a different statin.  I will try pravastatin 20 mg every Monday, Wednesday and Friday.  5. Hypertensive heart disease with heart failure:BPis elevated today in the office and at home fairly consistently. A blood pressure of 130/80 should be targeted.  I will increase losartan to 25 mg daily.   Disposition: Follow up 6 months   Kate Sable, M.D., F.A.C.C.

## 2018-06-05 NOTE — Progress Notes (Signed)
Effusion is circumferential but largest posterior to the heart. Most recent echo was in May and I don't appreciate any change from that study compared to the October 2018 study. I would probably do another limited echo for comparison. Needle pericardiocentesis would likely be successful, but I think chance of recurrent effusion would be high after a prior window. I'll ask Dr Servando Snare for his opinion as well, as repeat window might be a more durable treatment. thx Jamesetta So

## 2018-06-06 ENCOUNTER — Telehealth: Payer: Self-pay | Admitting: *Deleted

## 2018-06-06 DIAGNOSIS — I3139 Other pericardial effusion (noninflammatory): Secondary | ICD-10-CM

## 2018-06-06 DIAGNOSIS — I313 Pericardial effusion (noninflammatory): Secondary | ICD-10-CM

## 2018-06-06 NOTE — Telephone Encounter (Signed)
Patient informed. 

## 2018-06-06 NOTE — Telephone Encounter (Signed)
Left message for patient to call office.  

## 2018-06-06 NOTE — Telephone Encounter (Signed)
-----   Message from Herminio Commons, MD sent at 06/06/2018  7:56 AM EDT ----- Regarding: Limited echo for pericardial effusion Please put in for a follow-up limited echocardiogram to reassess pericardial effusion.  Thanks!  Jamesetta So

## 2018-06-09 ENCOUNTER — Telehealth: Payer: Self-pay | Admitting: Cardiovascular Disease

## 2018-06-09 NOTE — Telephone Encounter (Signed)
Pre-cert Verification for the following procedure   Limited Echo scheduled for 06-13-2018 at [facility]

## 2018-06-11 ENCOUNTER — Other Ambulatory Visit (HOSPITAL_COMMUNITY): Payer: Medicare HMO

## 2018-06-12 ENCOUNTER — Encounter (INDEPENDENT_AMBULATORY_CARE_PROVIDER_SITE_OTHER): Payer: Medicare HMO | Admitting: Ophthalmology

## 2018-06-13 ENCOUNTER — Other Ambulatory Visit: Payer: Self-pay | Admitting: *Deleted

## 2018-06-13 ENCOUNTER — Ambulatory Visit (HOSPITAL_COMMUNITY)
Admission: RE | Admit: 2018-06-13 | Discharge: 2018-06-13 | Disposition: A | Discharge status: Home / Self Care | Payer: Medicare HMO | Source: Ambulatory Visit | Attending: Cardiovascular Disease | Admitting: Cardiovascular Disease

## 2018-06-13 DIAGNOSIS — I5032 Chronic diastolic (congestive) heart failure: Secondary | ICD-10-CM | POA: Diagnosis not present

## 2018-06-13 DIAGNOSIS — I313 Pericardial effusion (noninflammatory): Secondary | ICD-10-CM

## 2018-06-13 DIAGNOSIS — I3139 Other pericardial effusion (noninflammatory): Secondary | ICD-10-CM

## 2018-06-13 DIAGNOSIS — I080 Rheumatic disorders of both mitral and aortic valves: Secondary | ICD-10-CM | POA: Insufficient documentation

## 2018-06-13 DIAGNOSIS — E785 Hyperlipidemia, unspecified: Secondary | ICD-10-CM | POA: Insufficient documentation

## 2018-06-13 DIAGNOSIS — E119 Type 2 diabetes mellitus without complications: Secondary | ICD-10-CM | POA: Diagnosis not present

## 2018-06-13 DIAGNOSIS — I110 Hypertensive heart disease with heart failure: Secondary | ICD-10-CM | POA: Diagnosis not present

## 2018-06-13 DIAGNOSIS — I252 Old myocardial infarction: Secondary | ICD-10-CM | POA: Insufficient documentation

## 2018-06-13 DIAGNOSIS — I2510 Atherosclerotic heart disease of native coronary artery without angina pectoris: Secondary | ICD-10-CM | POA: Insufficient documentation

## 2018-06-13 DIAGNOSIS — G4733 Obstructive sleep apnea (adult) (pediatric): Secondary | ICD-10-CM | POA: Insufficient documentation

## 2018-06-13 NOTE — Progress Notes (Signed)
*  PRELIMINARY RESULTS* Echocardiogram Limited 2-D Echocardiogram has been performed.  Eugene [name] 06/13/2018, 1:23 PM

## 2018-06-16 ENCOUNTER — Ambulatory Visit (INDEPENDENT_AMBULATORY_CARE_PROVIDER_SITE_OTHER): Payer: Medicare HMO | Admitting: Family Medicine

## 2018-06-16 ENCOUNTER — Encounter: Payer: Self-pay | Admitting: Family Medicine

## 2018-06-16 VITALS — BP 108/68 | Ht 73.0 in | Wt 257.2 lb

## 2018-06-16 DIAGNOSIS — E7849 Other hyperlipidemia: Secondary | ICD-10-CM | POA: Diagnosis not present

## 2018-06-16 DIAGNOSIS — E1165 Type 2 diabetes mellitus with hyperglycemia: Secondary | ICD-10-CM

## 2018-06-16 DIAGNOSIS — Z23 Encounter for immunization: Secondary | ICD-10-CM | POA: Diagnosis not present

## 2018-06-16 DIAGNOSIS — IMO0002 Reserved for concepts with insufficient information to code with codable children: Secondary | ICD-10-CM

## 2018-06-16 DIAGNOSIS — F439 Reaction to severe stress, unspecified: Secondary | ICD-10-CM

## 2018-06-16 DIAGNOSIS — G629 Polyneuropathy, unspecified: Secondary | ICD-10-CM

## 2018-06-16 DIAGNOSIS — E118 Type 2 diabetes mellitus with unspecified complications: Secondary | ICD-10-CM

## 2018-06-16 DIAGNOSIS — I10 Essential (primary) hypertension: Secondary | ICD-10-CM

## 2018-06-16 LAB — POCT GLYCOSYLATED HEMOGLOBIN (HGB A1C): HEMOGLOBIN A1C: 7.5 % — AB (ref 4.0–5.6)

## 2018-06-16 NOTE — Progress Notes (Signed)
Subjective:    Patient ID: Eugene [name], male    DOB: May 19, 1956, 62 y.o.   MRN: [id number]  Diabetes  He presents for his follow-up diabetic visit. He has type 2 diabetes mellitus. Pertinent negatives for hypoglycemia include no confusion, dizziness or headaches. Pertinent negatives for diabetes include no chest pain and no fatigue. Risk factors for coronary artery disease include diabetes mellitus, dyslipidemia, obesity and hypertension. Current diabetic treatment includes insulin injections. He is compliant with treatment all of the time. His weight is stable. He is following a diabetic diet. He has not had a previous visit with a dietitian. He sees a podiatrist.Eye exam is current.   Patient under some stress have been difficult time processing some things he denies being depressed he just finds himself at times stressed and sad because of the passing of some friends but he still finds enjoyment in things but he does feel he would benefit from seeing a counselor  Patient has severe peripheral neuropathy and recently scraped his lower leg causing spot that is now healing denies any infection with that  Patient does have cardiomyopathy and also pericardial effusion recently seen by cardiologist they did echo he is awaiting their interpretation echo does show some effusion but he denies any severe shortness of breath currently  Patient states he could be doing better with his diet and functioning better with healthier eating he will try to work hard to get his A1c down  Patient is on cholesterol medicine 3 days a week could not tolerate daily medicine so far he is tolerating the medicine he is due for cholesterol profile checked  Patient on diuretics is due for a metabolic 7 we will order that  Follow-up 3 months at that time factor in wellness and prostate exam Results for orders placed or performed in visit on 06/16/18  POCT glycosylated hemoglobin (Hb A1C)  Result Value Ref Range     Hemoglobin A1C 7.5 (A) 4.0 - 5.6 %   HbA1c POC (<> result, manual entry)     HbA1c, POC (prediabetic range)     HbA1c, POC (controlled diabetic range)       Review of Systems  Constitutional: Negative for diaphoresis and fatigue.  HENT: Negative for congestion and rhinorrhea.   Respiratory: Negative for cough and shortness of breath.   Cardiovascular: Negative for chest pain and leg swelling.  Gastrointestinal: Negative for abdominal pain and diarrhea.  Skin: Negative for color change and rash.  Neurological: Negative for dizziness and headaches.  Psychiatric/Behavioral: Negative for behavioral problems and confusion.       Objective:   Physical Exam  Constitutional: He appears well-nourished. No distress.  HENT:  Head: Normocephalic and atraumatic.  Eyes: Right eye exhibits no discharge. Left eye exhibits no discharge.  Neck: No tracheal deviation present.  Cardiovascular: Normal rate, regular rhythm and normal heart sounds.  No murmur heard. Pulmonary/Chest: Effort normal and breath sounds normal. No respiratory distress.  Musculoskeletal: He exhibits no edema.  Lymphadenopathy:    He has no cervical adenopathy.  Neurological: He is alert. Coordination normal.  Skin: Skin is warm and dry.  Psychiatric: He has a normal mood and affect. His behavior is normal.  Vitals reviewed.         Assessment & Plan:  Diabetes subpar control better diet continue medication send [place] some readings follow-up 3 months  Hyperlipidemia so far tolerating medicine recheck lipid profile taking pravastatin 3 days a week  Blood pressure good control continue current  measures  Severe peripheral neuropathy cautioned patient regarding falls and injury  Stress related issues referral for counseling  Pericardial effusion present on echo will have cardiology give him interpretation what it means currently right now not short of breath  25 minutes was spent with the patient.  This statement  verifies that 25 minutes was indeed spent with the patient.  More than 50% of this visit-total duration of the visit-was spent in counseling and coordination of care. The issues that the patient came in for today as reflected in the diagnosis (s) please refer to documentation for further details.

## 2018-06-16 NOTE — Patient Instructions (Signed)
Diabetes Mellitus and Nutrition When you have diabetes (diabetes mellitus), it is very important to have healthy eating habits because your blood sugar (glucose) levels are greatly affected by what you eat and drink. Eating healthy foods in the appropriate amounts, at about the same times every day, can help you:  Control your blood glucose.  Lower your risk of heart disease.  Improve your blood pressure.  Reach or maintain a healthy weight.  Every person with diabetes is different, and each person has different needs for a meal plan. Your health care provider may recommend that you work with a diet and nutrition specialist (dietitian) to make a meal plan that is best for you. Your meal plan may vary depending on factors such as:  The calories you need.  The medicines you take.  Your weight.  Your blood glucose, blood pressure, and cholesterol levels.  Your activity level.  Other health conditions you have, such as heart or kidney disease.  How do carbohydrates affect me? Carbohydrates affect your blood glucose level more than any other type of food. Eating carbohydrates naturally increases the amount of glucose in your blood. Carbohydrate counting is a method for keeping track of how many carbohydrates you eat. Counting carbohydrates is important to keep your blood glucose at a healthy level, especially if you use insulin or take certain oral diabetes medicines. It is important to know how many carbohydrates you can safely have in each meal. This is different for every person. Your dietitian can help you calculate how many carbohydrates you should have at each meal and for snack. Foods that contain carbohydrates include:  Bread, cereal, rice, pasta, and crackers.  Potatoes and corn.  Peas, beans, and lentils.  Milk and yogurt.  Fruit and juice.  Desserts, such as cakes, cookies, ice cream, and candy.  How does alcohol affect me? Alcohol can cause a sudden decrease in blood  glucose (hypoglycemia), especially if you use insulin or take certain oral diabetes medicines. Hypoglycemia can be a life-threatening condition. Symptoms of hypoglycemia (sleepiness, dizziness, and confusion) are similar to symptoms of having too much alcohol. If your health care provider says that alcohol is safe for you, follow these guidelines:  Limit alcohol intake to no more than 1 drink per day for nonpregnant women and 2 drinks per day for men. One drink equals 12 oz of beer, 5 oz of wine, or 1 oz of hard liquor.  Do not drink on an empty stomach.  Keep yourself hydrated with water, diet soda, or unsweetened iced tea.  Keep in mind that regular soda, juice, and other mixers may contain a lot of sugar and must be counted as carbohydrates.  What are tips for following this plan? Reading food labels  Start by checking the serving size on the label. The amount of calories, carbohydrates, fats, and other nutrients listed on the label are based on one serving of the food. Many foods contain more than one serving per package.  Check the total grams (g) of carbohydrates in one serving. You can calculate the number of servings of carbohydrates in one serving by dividing the total carbohydrates by 15. For example, if a food has 30 g of total carbohydrates, it would be equal to 2 servings of carbohydrates.  Check the number of grams (g) of saturated and trans fats in one serving. Choose foods that have low or no amount of these fats.  Check the number of milligrams (mg) of sodium in one serving. Most people   should limit total sodium intake to less than 2,300 mg per day.  Always check the nutrition information of foods labeled as "low-fat" or "nonfat". These foods may be higher in added sugar or refined carbohydrates and should be avoided.  Talk to your dietitian to identify your daily goals for nutrients listed on the label. Shopping  Avoid buying canned, premade, or processed foods. These  foods tend to be high in fat, sodium, and added sugar.  Shop around the outside edge of the grocery store. This includes fresh fruits and vegetables, bulk grains, fresh meats, and fresh dairy. Cooking  Use low-heat cooking methods, such as baking, instead of high-heat cooking methods like deep frying.  Cook using healthy oils, such as olive, canola, or sunflower oil.  Avoid cooking with butter, cream, or high-fat meats. Meal planning  Eat meals and snacks regularly, preferably at the same times every day. Avoid going long periods of time without eating.  Eat foods high in fiber, such as fresh fruits, vegetables, beans, and whole grains. Talk to your dietitian about how many servings of carbohydrates you can eat at each meal.  Eat 4-6 ounces of lean protein each day, such as lean meat, chicken, fish, eggs, or tofu. 1 ounce is equal to 1 ounce of meat, chicken, or fish, 1 egg, or 1/4 cup of tofu.  Eat some foods each day that contain healthy fats, such as avocado, nuts, seeds, and fish. Lifestyle   Check your blood glucose regularly.  Exercise at least 30 minutes 5 or more days each week, or as told by your health care provider.  Take medicines as told by your health care provider.  Do not use any products that contain nicotine or tobacco, such as cigarettes and e-cigarettes. If you need help quitting, ask your health care provider.  Work with a counselor or diabetes educator to identify strategies to manage stress and any emotional and social challenges. What are some questions to ask my health care provider?  Do I need to meet with a diabetes educator?  Do I need to meet with a dietitian?  What number can I call if I have questions?  When are the best times to check my blood glucose? Where to find more information:  American Diabetes Association: diabetes.org/food-and-fitness/food  Academy of Nutrition and Dietetics:  www.eatright.org/resources/health/diseases-and-conditions/diabetes  National Institute of Diabetes and Digestive and Kidney Diseases (NIH): www.niddk.nih.gov/health-information/diabetes/overview/diet-eating-physical-activity Summary  A healthy meal plan will help you control your blood glucose and maintain a healthy lifestyle.  Working with a diet and nutrition specialist (dietitian) can help you make a meal plan that is best for you.  Keep in mind that carbohydrates and alcohol have immediate effects on your blood glucose levels. It is important to count carbohydrates and to use alcohol carefully. This information is not intended to replace advice given to you by your health care provider. Make sure you discuss any questions you have with your health care provider. Document Released: 05/10/2005 Document Revised: 09/17/2016 Document Reviewed: 09/17/2016 Elsevier Interactive Patient Education  2018 [employer].  

## 2018-06-17 ENCOUNTER — Telehealth: Payer: Self-pay | Admitting: *Deleted

## 2018-06-17 LAB — HEPATIC FUNCTION PANEL
ALT: 18 IU/L (ref 0–44)
AST: 21 IU/L (ref 0–40)
Albumin: 4.3 g/dL (ref 3.6–4.8)
Alkaline Phosphatase: 59 IU/L (ref 39–117)
Bilirubin Total: 0.4 mg/dL (ref 0.0–1.2)
Bilirubin, Direct: 0.17 mg/dL (ref 0.00–0.40)
Total Protein: 6.8 g/dL (ref 6.0–8.5)

## 2018-06-17 LAB — BASIC METABOLIC PANEL
BUN / CREAT RATIO: 24 (ref 10–24)
BUN: 46 mg/dL — AB (ref 8–27)
CALCIUM: 9.6 mg/dL (ref 8.6–10.2)
CHLORIDE: 101 mmol/L (ref 96–106)
CO2: 23 mmol/L (ref 20–29)
Creatinine, Ser: 1.93 mg/dL — ABNORMAL HIGH (ref 0.76–1.27)
GFR calc Af Amer: 42 mL/min/{1.73_m2} — ABNORMAL LOW (ref >59)
GFR calc non Af Amer: 37 mL/min/{1.73_m2} — ABNORMAL LOW (ref >59)
GLUCOSE: 207 mg/dL — AB (ref 65–99)
Potassium: 5.3 mmol/L — ABNORMAL HIGH (ref 3.5–5.2)
Sodium: 139 mmol/L (ref 134–144)

## 2018-06-17 LAB — LIPID PANEL
CHOLESTEROL TOTAL: 179 mg/dL (ref 100–199)
Chol/HDL Ratio: 5.3 ratio — ABNORMAL HIGH (ref 0.0–5.0)
HDL: 34 mg/dL — AB (ref >39)
LDL CALC: 100 mg/dL — AB (ref 0–99)
Triglycerides: 225 mg/dL — ABNORMAL HIGH (ref 0–149)
VLDL CHOLESTEROL CAL: 45 mg/dL — AB (ref 5–40)

## 2018-06-17 NOTE — Telephone Encounter (Signed)
Notes recorded by Laurine Blazer, LPN on 62/26/3335 at 11:15 AM EDT Patient notified. Copy to pmd. ------  Notes recorded by Herminio Commons, MD on 06/16/2018 at 4:28 PM EDT This study demonstrates: Pumping function is normal. There is a large fluid collection around the heart and appears to have increased in size when compared echocardiogram from May 2019. Medication changes / Follow up studies / Other recommendations:  I will speak with my interventional colleagues regarding further management. Please send results to the PCP: [name], MD  Kate Sable, MD 06/16/2018 4:27 PM

## 2018-06-19 ENCOUNTER — Encounter (INDEPENDENT_AMBULATORY_CARE_PROVIDER_SITE_OTHER): Payer: Medicare HMO | Admitting: Ophthalmology

## 2018-06-19 ENCOUNTER — Other Ambulatory Visit: Payer: Self-pay

## 2018-06-19 ENCOUNTER — Other Ambulatory Visit: Payer: Self-pay | Admitting: Family Medicine

## 2018-06-19 DIAGNOSIS — M10031 Idiopathic gout, right wrist: Secondary | ICD-10-CM

## 2018-06-19 MED ORDER — ALLOPURINOL 100 MG PO TABS: ORAL_TABLET | ORAL | 1 refills | Status: DC

## 2018-06-24 ENCOUNTER — Encounter (INDEPENDENT_AMBULATORY_CARE_PROVIDER_SITE_OTHER): Payer: Medicare HMO | Admitting: Ophthalmology

## 2018-06-24 DIAGNOSIS — E11311 Type 2 diabetes mellitus with unspecified diabetic retinopathy with macular edema: Secondary | ICD-10-CM

## 2018-06-24 DIAGNOSIS — H43813 Vitreous degeneration, bilateral: Secondary | ICD-10-CM

## 2018-06-24 DIAGNOSIS — I10 Essential (primary) hypertension: Secondary | ICD-10-CM | POA: Diagnosis not present

## 2018-06-24 DIAGNOSIS — E113512 Type 2 diabetes mellitus with proliferative diabetic retinopathy with macular edema, left eye: Secondary | ICD-10-CM | POA: Diagnosis not present

## 2018-06-24 DIAGNOSIS — E113311 Type 2 diabetes mellitus with moderate nonproliferative diabetic retinopathy with macular edema, right eye: Secondary | ICD-10-CM

## 2018-06-24 DIAGNOSIS — H35033 Hypertensive retinopathy, bilateral: Secondary | ICD-10-CM

## 2018-06-24 DIAGNOSIS — D3132 Benign neoplasm of left choroid: Secondary | ICD-10-CM

## 2018-06-25 ENCOUNTER — Ambulatory Visit: Payer: Medicare HMO | Admitting: Orthopaedic Surgery

## 2018-06-27 ENCOUNTER — Telehealth: Payer: Self-pay | Admitting: *Deleted

## 2018-06-27 ENCOUNTER — Telehealth: Payer: Self-pay | Admitting: Cardiovascular Disease

## 2018-06-27 DIAGNOSIS — I3139 Other pericardial effusion (noninflammatory): Secondary | ICD-10-CM

## 2018-06-27 DIAGNOSIS — I313 Pericardial effusion (noninflammatory): Secondary | ICD-10-CM

## 2018-06-27 NOTE — Telephone Encounter (Signed)
Herminio Commons, MD  Laurine Blazer, LPN        Please arrange for chest CT for pericardial effusion. If there is no significant intrathoracic pathology, arrange for pericardiocentesis with Dr. Burt Knack.

## 2018-06-27 NOTE — Telephone Encounter (Signed)
Pre-cert Verification for the following procedure    CT CHEST WITH CONTRAST   Scheduled for 07-15-18 at [facility]

## 2018-06-27 NOTE — Telephone Encounter (Signed)
Notes recorded by Laurine Blazer, LPN on 11/65/7903 at 6:04 PM EDT Patient notified and verbalized understanding. Agrees to doing chest CT. (with contrast)  Will put order into Epic & fwd to [facility] for scheduling.

## 2018-07-02 ENCOUNTER — Ambulatory Visit: Payer: Medicare HMO

## 2018-07-08 ENCOUNTER — Ambulatory Visit: Payer: Medicare HMO | Admitting: Orthotics

## 2018-07-09 ENCOUNTER — Other Ambulatory Visit: Payer: Self-pay | Admitting: Family Medicine

## 2018-07-09 NOTE — Telephone Encounter (Signed)
May have this +2 refills 

## 2018-07-15 ENCOUNTER — Ambulatory Visit (HOSPITAL_COMMUNITY): Payer: Medicare HMO

## 2018-07-22 ENCOUNTER — Encounter (INDEPENDENT_AMBULATORY_CARE_PROVIDER_SITE_OTHER): Payer: Medicare HMO | Admitting: Ophthalmology

## 2018-07-22 ENCOUNTER — Ambulatory Visit: Payer: Medicare HMO | Admitting: Orthotics

## 2018-07-22 DIAGNOSIS — D3132 Benign neoplasm of left choroid: Secondary | ICD-10-CM

## 2018-07-22 DIAGNOSIS — H35033 Hypertensive retinopathy, bilateral: Secondary | ICD-10-CM

## 2018-07-22 DIAGNOSIS — E113512 Type 2 diabetes mellitus with proliferative diabetic retinopathy with macular edema, left eye: Secondary | ICD-10-CM

## 2018-07-22 DIAGNOSIS — I10 Essential (primary) hypertension: Secondary | ICD-10-CM

## 2018-07-22 DIAGNOSIS — E113311 Type 2 diabetes mellitus with moderate nonproliferative diabetic retinopathy with macular edema, right eye: Secondary | ICD-10-CM

## 2018-07-22 DIAGNOSIS — H43813 Vitreous degeneration, bilateral: Secondary | ICD-10-CM | POA: Diagnosis not present

## 2018-07-22 DIAGNOSIS — E11311 Type 2 diabetes mellitus with unspecified diabetic retinopathy with macular edema: Secondary | ICD-10-CM

## 2018-07-28 DIAGNOSIS — R809 Proteinuria, unspecified: Secondary | ICD-10-CM | POA: Diagnosis not present

## 2018-07-28 DIAGNOSIS — Z79899 Other long term (current) drug therapy: Secondary | ICD-10-CM | POA: Diagnosis not present

## 2018-07-28 DIAGNOSIS — M10031 Idiopathic gout, right wrist: Secondary | ICD-10-CM | POA: Diagnosis not present

## 2018-07-28 DIAGNOSIS — I10 Essential (primary) hypertension: Secondary | ICD-10-CM | POA: Diagnosis not present

## 2018-07-28 DIAGNOSIS — E559 Vitamin D deficiency, unspecified: Secondary | ICD-10-CM | POA: Diagnosis not present

## 2018-07-28 DIAGNOSIS — N183 Chronic kidney disease, stage 3 (moderate): Secondary | ICD-10-CM | POA: Diagnosis not present

## 2018-07-28 DIAGNOSIS — D509 Iron deficiency anemia, unspecified: Secondary | ICD-10-CM | POA: Diagnosis not present

## 2018-07-29 LAB — BASIC METABOLIC PANEL
BUN/Creatinine Ratio: 33 — ABNORMAL HIGH (ref 10–24)
BUN: 61 mg/dL — ABNORMAL HIGH (ref 8–27)
CHLORIDE: 97 mmol/L (ref 96–106)
CO2: 22 mmol/L (ref 20–29)
Calcium: 9.2 mg/dL (ref 8.6–10.2)
Creatinine, Ser: 1.85 mg/dL — ABNORMAL HIGH (ref 0.76–1.27)
GFR calc Af Amer: 44 mL/min/{1.73_m2} — ABNORMAL LOW (ref >59)
GFR calc non Af Amer: 38 mL/min/{1.73_m2} — ABNORMAL LOW (ref >59)
GLUCOSE: 388 mg/dL — AB (ref 65–99)
Potassium: 5.2 mmol/L (ref 3.5–5.2)
SODIUM: 135 mmol/L (ref 134–144)

## 2018-07-29 LAB — URIC ACID: URIC ACID: 8.9 mg/dL — AB (ref 3.7–8.6)

## 2018-07-30 ENCOUNTER — Ambulatory Visit (HOSPITAL_COMMUNITY): Admission: RE | Admit: 2018-07-30 | Payer: Medicare HMO | Source: Ambulatory Visit

## 2018-07-30 ENCOUNTER — Telehealth: Payer: Self-pay | Admitting: Cardiovascular Disease

## 2018-07-30 DIAGNOSIS — N183 Chronic kidney disease, stage 3 (moderate): Secondary | ICD-10-CM | POA: Diagnosis not present

## 2018-07-30 DIAGNOSIS — R809 Proteinuria, unspecified: Secondary | ICD-10-CM | POA: Diagnosis not present

## 2018-07-30 DIAGNOSIS — E875 Hyperkalemia: Secondary | ICD-10-CM | POA: Diagnosis not present

## 2018-07-30 DIAGNOSIS — E559 Vitamin D deficiency, unspecified: Secondary | ICD-10-CM | POA: Diagnosis not present

## 2018-07-30 DIAGNOSIS — D638 Anemia in other chronic diseases classified elsewhere: Secondary | ICD-10-CM | POA: Diagnosis not present

## 2018-07-30 NOTE — Telephone Encounter (Signed)
Patient had to cancel CT today per Kidney doctor and he rescheduled He asked for you to call him

## 2018-07-31 ENCOUNTER — Encounter: Payer: Self-pay | Admitting: Family Medicine

## 2018-07-31 NOTE — Telephone Encounter (Signed)
Patient called stating that he has not received a call back from office.

## 2018-08-01 ENCOUNTER — Encounter: Payer: Self-pay | Admitting: *Deleted

## 2018-08-01 NOTE — Telephone Encounter (Signed)
Spoke with patient.  Stated that his kidney doctor advised him to cancel CT for now due to his kidney function.  (see by Dr. Lowanda Foster on 07/30/2018)  Informed patient that I will request office note and labs from him & have Dr. Bronson Ing review.  Will get back with him afterwards.

## 2018-08-12 ENCOUNTER — Ambulatory Visit (HOSPITAL_COMMUNITY): Payer: Medicare HMO

## 2018-08-13 ENCOUNTER — Ambulatory Visit: Payer: Medicare HMO | Admitting: Orthotics

## 2018-08-13 ENCOUNTER — Ambulatory Visit: Payer: Medicare HMO | Admitting: Podiatry

## 2018-08-18 NOTE — Telephone Encounter (Signed)
Notes received.  Will place on Dr. Raliegh Ip desk for his review upon his return to this office.  (08/25/2018)

## 2018-08-25 ENCOUNTER — Telehealth: Payer: Self-pay | Admitting: Cardiovascular Disease

## 2018-08-25 ENCOUNTER — Telehealth: Payer: Self-pay

## 2018-08-25 DIAGNOSIS — I313 Pericardial effusion (noninflammatory): Secondary | ICD-10-CM

## 2018-08-25 DIAGNOSIS — I3139 Other pericardial effusion (noninflammatory): Secondary | ICD-10-CM

## 2018-08-25 NOTE — Telephone Encounter (Signed)
Eugene [name] from Dr. Florentina [name] office called requesting information on mutual patient. He was scheduled for CTA and it was cancelled due to pt's kidney function. Dr. Lowanda [name] would like to know next step for patient. Looking in chart, the notes was faxed for Dr. Court [name] review. Please advise.

## 2018-08-25 NOTE — Telephone Encounter (Signed)
Asking to speak with Edd Fabian about procedure and kidneys

## 2018-08-25 NOTE — Telephone Encounter (Signed)
Can do without contrast.

## 2018-08-26 NOTE — Telephone Encounter (Signed)
Will forward to [facility] as this is an [place] pt.

## 2018-08-28 ENCOUNTER — Other Ambulatory Visit: Payer: Self-pay | Admitting: Family Medicine

## 2018-08-28 NOTE — Telephone Encounter (Signed)
Patient notified earlier this evening.  Agrees to rescheduling his chest CT without contrast.    Also stated that his Hydralazine has been d/c'd as well.

## 2018-08-28 NOTE — Telephone Encounter (Signed)
Addressed in other phone note.

## 2018-08-28 NOTE — Addendum Note (Signed)
Addended by: Laurine Blazer on: 08/28/2018 05:52 PM   Modules accepted: Orders

## 2018-08-29 ENCOUNTER — Telehealth: Payer: Self-pay | Admitting: Cardiovascular Disease

## 2018-08-29 NOTE — Telephone Encounter (Signed)
Pre-cert Verification for the following procedure   CT CHEST W/O CONTRAST  Scheduled for 09/01/2018 at [facility]

## 2018-09-02 ENCOUNTER — Ambulatory Visit: Payer: Medicare HMO | Admitting: Podiatry

## 2018-09-02 ENCOUNTER — Ambulatory Visit (INDEPENDENT_AMBULATORY_CARE_PROVIDER_SITE_OTHER): Payer: Medicare HMO | Admitting: Orthotics

## 2018-09-02 ENCOUNTER — Encounter (INDEPENDENT_AMBULATORY_CARE_PROVIDER_SITE_OTHER): Payer: Medicare HMO | Admitting: Ophthalmology

## 2018-09-02 DIAGNOSIS — E1142 Type 2 diabetes mellitus with diabetic polyneuropathy: Secondary | ICD-10-CM

## 2018-09-02 DIAGNOSIS — L84 Corns and callosities: Secondary | ICD-10-CM | POA: Diagnosis not present

## 2018-09-02 DIAGNOSIS — M79674 Pain in right toe(s): Secondary | ICD-10-CM

## 2018-09-02 DIAGNOSIS — M79675 Pain in left toe(s): Secondary | ICD-10-CM

## 2018-09-02 DIAGNOSIS — B351 Tinea unguium: Secondary | ICD-10-CM

## 2018-09-02 DIAGNOSIS — M2041 Other hammer toe(s) (acquired), right foot: Secondary | ICD-10-CM | POA: Diagnosis not present

## 2018-09-02 DIAGNOSIS — M2042 Other hammer toe(s) (acquired), left foot: Secondary | ICD-10-CM

## 2018-09-02 NOTE — Telephone Encounter (Signed)
Pre-cert Verification for the following procedure    CT Chest wo Contrast has been re-scheduled for 09/11/2018 at [facility]

## 2018-09-02 NOTE — Progress Notes (Signed)

## 2018-09-02 NOTE — Patient Instructions (Signed)

## 2018-09-10 ENCOUNTER — Other Ambulatory Visit: Payer: Self-pay | Admitting: Family Medicine

## 2018-09-11 ENCOUNTER — Ambulatory Visit (HOSPITAL_COMMUNITY)
Admission: RE | Admit: 2018-09-11 | Discharge: 2018-09-11 | Disposition: A | Discharge status: Home / Self Care | Payer: Medicare HMO | Source: Ambulatory Visit | Attending: Cardiovascular Disease | Admitting: Cardiovascular Disease

## 2018-09-11 DIAGNOSIS — I3139 Other pericardial effusion (noninflammatory): Secondary | ICD-10-CM

## 2018-09-11 DIAGNOSIS — I313 Pericardial effusion (noninflammatory): Secondary | ICD-10-CM | POA: Diagnosis not present

## 2018-09-16 ENCOUNTER — Ambulatory Visit: Payer: Medicare HMO | Admitting: Family Medicine

## 2018-09-17 ENCOUNTER — Ambulatory Visit: Payer: Medicare HMO | Admitting: Podiatry

## 2018-09-18 ENCOUNTER — Other Ambulatory Visit: Payer: Self-pay | Admitting: Family Medicine

## 2018-09-18 ENCOUNTER — Telehealth: Payer: Self-pay | Admitting: *Deleted

## 2018-09-18 NOTE — Telephone Encounter (Signed)
Notes recorded by Laurine Blazer, LPN on 0/99/2780 at 0:44 AM EST Patient notified. Copy to pmd. Follow up scheduled for April with Dr. Bronson Ing. Will let patient know when hear back from Dr. Burt Knack.   ------  Notes recorded by Herminio Commons, MD on 09/12/2018 at 11:54 AM EST Fluid collection around the heart and coronary artery disease seen. I will also forward to Dr. Burt Knack to obtain his opinion regarding pericardial effusion management.

## 2018-09-22 ENCOUNTER — Encounter (INDEPENDENT_AMBULATORY_CARE_PROVIDER_SITE_OTHER): Payer: Medicare HMO | Admitting: Ophthalmology

## 2018-09-22 DIAGNOSIS — H35033 Hypertensive retinopathy, bilateral: Secondary | ICD-10-CM

## 2018-09-22 DIAGNOSIS — E11311 Type 2 diabetes mellitus with unspecified diabetic retinopathy with macular edema: Secondary | ICD-10-CM | POA: Diagnosis not present

## 2018-09-22 DIAGNOSIS — D3132 Benign neoplasm of left choroid: Secondary | ICD-10-CM

## 2018-09-22 DIAGNOSIS — I10 Essential (primary) hypertension: Secondary | ICD-10-CM | POA: Diagnosis not present

## 2018-09-22 DIAGNOSIS — H43813 Vitreous degeneration, bilateral: Secondary | ICD-10-CM | POA: Diagnosis not present

## 2018-09-22 DIAGNOSIS — E113512 Type 2 diabetes mellitus with proliferative diabetic retinopathy with macular edema, left eye: Secondary | ICD-10-CM

## 2018-09-22 DIAGNOSIS — E113311 Type 2 diabetes mellitus with moderate nonproliferative diabetic retinopathy with macular edema, right eye: Secondary | ICD-10-CM

## 2018-09-23 ENCOUNTER — Encounter: Payer: Self-pay | Admitting: Podiatry

## 2018-09-23 ENCOUNTER — Ambulatory Visit: Payer: Medicare HMO | Admitting: Family Medicine

## 2018-09-23 NOTE — Progress Notes (Signed)
Subjective: Eugene [name] presents today with painful, thick toenails 1-5 b/l that he cannot cut and which interfere with daily activities.  Pain is aggravated when wearing enclosed shoe gear and relieved with periodic professional debridement.   Allergies  Allergen Reactions  . Hydrocodone Nausea And Vomiting  . Amlodipine   . Lisinopril Cough  . Neurontin [Gabapentin] Other (See Comments)    Reaction:  Suicidal thoughts   . Statins Other (See Comments)    Reaction:  Muscle pain   . Metformin And Related Diarrhea  . Norvasc [Amlodipine Besylate] Swelling and Other (See Comments)    Reaction:  Pedal edema    Objective:  Vascular Examination: Capillary refill time immediate x 10 digits  Dorsalis pedis and Posterior tibial pulses palpable b/l  Digital hair present x 10 digits  Skin temperature gradient WNL b/l  Dermatological Examination: Skin with normal turgor, texture and tone b/l  Toenails 1-5 b/l discolored, thick, dystrophic with subungual debris and pain with palpation to nailbeds due to thickness of nails.  Right great toe nailplate incurvated with nail border hypertrophy.  No erythema, no edema, no drainage.  Hyperkeratotic lesion submet head 3 left foot  Musculoskeletal: Muscle strength 5/5 to all LE muscle groups  No gross bony deformities b/l.  No pain, crepitus or joint limitation noted with ROM.   Neurological: Sensation intact with 10 gram monofilament. Vibratory sensation intact.  Assessment: Painful onychomycosis toenails 1-5 b/l  Callus submet head 3 left foot NIDDM with neuropathy  Plan: 1. Toenails 1-5 b/l were debrided in length and girth without iatrogenic bleeding. Incurvated nail plate debrided and curretaged. Border cleansed with alcohol. Triple antibiotic ointment applied. Instructed to apply Neosporin to digit once daily. 2. Patient to continue soft, supportive shoe gear 3. Patient to report any pedal injuries to medical  professional immediately. 4. Follow up 2 weeks for check of right great toe. 5.  Patient/POA to call should there be a concern in the interim.

## 2018-09-24 ENCOUNTER — Telehealth: Payer: Self-pay | Admitting: *Deleted

## 2018-09-24 DIAGNOSIS — I313 Pericardial effusion (noninflammatory): Secondary | ICD-10-CM

## 2018-09-24 DIAGNOSIS — I3139 Other pericardial effusion (noninflammatory): Secondary | ICD-10-CM

## 2018-09-24 NOTE — Telephone Encounter (Signed)
Patient notified and verbalized understanding.  He agrees to seeing Dr. Pia Mau.  Referral placed.

## 2018-09-24 NOTE — Telephone Encounter (Signed)
-----   Message from Herminio Commons, MD sent at 09/24/2018  8:57 AM EST ----- Regarding: Make appt with Servando Snare to discuss pericardial window

## 2018-09-24 NOTE — Telephone Encounter (Signed)
Attempted to reach patient no answer °

## 2018-09-25 ENCOUNTER — Other Ambulatory Visit: Payer: Self-pay

## 2018-09-25 ENCOUNTER — Institutional Professional Consult (permissible substitution): Payer: Medicare HMO | Admitting: Cardiothoracic Surgery

## 2018-09-25 VITALS — BP 132/77 | HR 72 | Resp 20 | Ht 73.0 in | Wt 255.0 lb

## 2018-09-25 DIAGNOSIS — I313 Pericardial effusion (noninflammatory): Secondary | ICD-10-CM

## 2018-09-25 DIAGNOSIS — I3139 Other pericardial effusion (noninflammatory): Secondary | ICD-10-CM

## 2018-09-25 NOTE — Progress Notes (Signed)
Eugene [name]       Eugene [name],Eugene [name]             [phone]                    Eugene [name] Medical Record #[id number] Date of Birth: 1955-12-29  Referring: Eugene Commons, MD Primary Care: Eugene Drown, MD Primary Cardiologist: Eugene Sable, MD  Chief Complaint:    Chief Complaint  Patient presents with  . Pericardial Effusion    Surgical eval, Chest CT 09/12/18, ECHO 06/13/18, HX of subxiphoid pericardial window 05/2013    History of Present Illness:    Eugene [name] 63 y.o. male is seen in the office  today for recurrent pericardial effusion.  The patient had originally presented 6 years ago with a myocardial infarction and subsequent acute pericardial effusion requiring drainage.  Patient has severe diabetes with peripheral neuropathy, advanced stage renal insufficiency but not requiring dialysis.  He has been followed with serial echocardiograms, and is noted over the past year and a half to have slowly increasing pericardial effusion.  Recent CT of the chest without contrast showed no evidence of mediastinal mass or lung mass or other findings that may be a cause to his effusion.   The patient is somewhat limited by shortness of breath but this is been stable over the past several years.       Current Activity/ Functional Status:  Patient is independent with mobility/ambulation, transfers, ADL's, IADL's.   Zubrod Score: At the time of surgery this patient's most appropriate activity status/level should be described as: []     0    Normal activity, no symptoms [x]     1    Restricted in physical strenuous activity but ambulatory, able to do out light work []     2    Ambulatory and capable of self care, unable to do work activities, up and about               >50 % of waking hours                              []     3    Only limited self care, in bed greater than 50% of waking hours []     4    Completely disabled,  no self care, confined to bed or chair []     5    Moribund   Past Medical History:  Diagnosis Date  . Anemia   . Anxiety   . Arteriosclerotic cardiovascular disease (ASCVD)    a. 05/2011 s/p DES to PDA and RCA. b. 08/2012 Inflat  STEMI/Cath/PCI: LM minor irregs, LAD 50p, D1 50, LCX nl, OM1 25, RCA 30-40p, 100d (treated with 2.75x41mm Promus Premier DES);    [name] Asthma   . Bell palsy   . C. difficile colitis    a. 08/2012  . Cholelithiasis 07/2012   Asymptomatic; identified incidentally  . Chronic diastolic CHF (congestive heart failure) ([place])   . Contrast dye induced nephropathy    a. 08/2012 post cath/pci  . COPD (chronic obstructive pulmonary disease) ([place])   . Diabetes mellitus    Peripheral neuropathy  . Gallstones   . GERD (gastroesophageal reflux disease)   . Hyperlipidemia   . Hypertension   . Myocardial infarct ([place]) 09/08/12  . Nephrolithiasis   . Old myocardial infarction   .  Pericardial effusion    a. s/p window in 2014.  [name] PONV (postoperative nausea and vomiting)   . Sleep apnea     Past Surgical History:  Procedure Laterality Date  . CARDIAC CATHETERIZATION    . CHEST TUBE INSERTION    . CIRCUMCISION    . COLONOSCOPY     In [facility], approximately 2011 per patient, was normal. Advised to come back in 10 years.  . ESOPHAGOGASTRODUODENOSCOPY     in [place] over 20 yrs ago  . ESOPHAGOGASTRODUODENOSCOPY  06/12/2012   KCL:EXNTZG esophagus-status post Venia Minks dilation. Abnormal gastric mucosa of uncertain significance-status post biopsy  . INTRAOPERATIVE TRANSESOPHAGEAL ECHOCARDIOGRAM N/A 06/23/2013   Procedure: INTRAOPERATIVE TRANSESOPHAGEAL ECHOCARDIOGRAM;  Surgeon: Grace Isaac, MD;  Location: [facility];  Service: Open Heart Surgery;  Laterality: N/A;  . LEFT HEART CATHETERIZATION WITH CORONARY ANGIOGRAM N/A 09/08/2012   Procedure: LEFT HEART CATHETERIZATION WITH CORONARY ANGIOGRAM;  Surgeon: [name], MD;  Location: [facility] CATH LAB;  Service:  Cardiovascular;  Laterality: N/A;  . stents    . SUBXYPHOID PERICARDIAL WINDOW N/A 06/23/2013   Procedure: SUBXYPHOID PERICARDIAL WINDOW;  Surgeon: Grace Isaac, MD;  Location: [facility] OR;  Service: Thoracic;  Laterality: N/A;    Family History  Problem Relation Age of Onset  . Diabetes Mother   . Heart attack Mother   . Stroke Mother   . Diabetes Sister   . Sleep apnea Sister   . Hypertension Brother   . Diabetes Brother   . Diabetes Brother   . Hypertension Brother   . Colon cancer Neg Hx   . Liver disease Neg Hx      Social History   Tobacco Use  Smoking Status Never Smoker  Smokeless Tobacco Never Used    Social History   Substance and Sexual Activity  Alcohol Use No  . Alcohol/week: 0.0 standard drinks   Comment: heavy etoh use 30 years ago     Allergies  Allergen Reactions  . Hydrocodone Nausea And Vomiting  . Amlodipine   . Lisinopril Cough  . Neurontin [Gabapentin] Other (See Comments)    Reaction:  Suicidal thoughts   . Statins Other (See Comments)    Reaction:  Muscle pain   . Metformin And Related Diarrhea  . Norvasc [Amlodipine Besylate] Swelling and Other (See Comments)    Reaction:  Pedal edema    Current Outpatient Medications  Medication Sig Dispense Refill  . acetaminophen (TYLENOL) 500 MG tablet Take 1,000 mg by mouth every 6 (six) hours as needed for mild pain, moderate pain, fever or headache.     . allopurinol (ZYLOPRIM) 100 MG tablet Take one tablet by mouth daily 90 tablet 1  . ALPRAZolam (XANAX) 0.5 MG tablet TAKE 1/2-1 TABLET BY MOUTH AT BEDTIME AS NEEDED 30 tablet 5  . aspirin 81 MG chewable tablet Chew 81 mg by mouth at bedtime.    [name] COLCRYS 0.6 MG tablet TAKE 2 TABLETS BY MOUTH NOW AND THEN TAKE 1 TABLET BY MOUTH TWO TIMES A DAY UNTIL RELIEF 20 tablet 2  . diclofenac sodium (VOLTAREN) 1 % GEL Apply 2 g topically 4 (four) times daily. 1 Tube 5  . DROPLET PEN NEEDLES 32G X 6 MM MISC USE AS DIRECTED 300 each 0  . fenofibrate 160 MG  tablet TAKE 1 TABLET AT BEDTIME 90 tablet 0  . glimepiride (AMARYL) 1 MG tablet TAKE ONE TABLET BY MOUTH TWO TIMES A DAY WITH MEALS 60 tablet 5  . glucose blood (ACCU-CHEK  SMARTVIEW) test strip TEST BLOOD SUGAR ONE TIME DAILY 50 each 5  . Insulin Pen Needle (BD PEN NEEDLE NANO U/F) 32G X 4 MM MISC Use once daily as directed 100 each 0  . isosorbide mononitrate (IMDUR) 60 MG 24 hr tablet TAKE 1 TABLET (60 MG TOTAL) BY MOUTH DAILY. 90 tablet 3  . LEVEMIR FLEXTOUCH 100 UNIT/ML Pen INJECT 80 UNITS INTO THE SKIN DAILY AT 10 PM. 30 mL 3  . losartan (COZAAR) 25 MG tablet Take 1 tablet (25 mg total) by mouth daily. 90 tablet 3  . metoprolol tartrate (LOPRESSOR) 25 MG tablet TAKE 1 TABLET TWICE DAILY 180 tablet 0  . mupirocin ointment (BACTROBAN) 2 % Apply to affected area 1 times daily for next 2 weeks 22 g 0  . nitroGLYCERIN (NITROSTAT) 0.4 MG SL tablet Place 1 tablet (0.4 mg total) under the tongue every 5 (five) minutes x 3 doses as needed for chest pain. 25 tablet 2  . OVER THE COUNTER MEDICATION Apply 1 application topically daily. Diabetic foot cream    . pravastatin (PRAVACHOL) 20 MG tablet Take one tablet by mouth daily on Monday, Wednesday, and Friday 15 tablet 6  . torsemide (DEMADEX) 20 MG tablet TAKE 1 TABLET (20 MG TOTAL) BY MOUTH DAILY. 90 tablet 0  . traMADol (ULTRAM) 50 MG tablet TAKE ONE TABLET BY MOUTH EVERY 6 HOURS AS NEEDED 24 tablet 2  . Witch Hazel (HEMORRHOIDAL EX) suppositories     No current facility-administered medications for this visit.     Pertinent items are noted in HPI.   Review of Systems:     Cardiac Review of Systems: [Y] = yes  or   [ N ] = no   Chest Pain [  n ]  Resting SOB [ n  ] Exertional SOB  [  y  Orthopnea Florencio.Farrier  ]   Pedal Edema [ y  ]    Palpitations [n  ] Syncope  [n  ]   Presyncope [  n ]   General Review of Systems: [Y] = yes [  ]=no Constitional: recent weight change [  ];  Wt loss over the last 3 months [   ] anorexia [  ]; fatigue [  ]; nausea [   ]; night sweats [  ]; fever [  ]; or chills [  ];           Eye : blurred vision [  ]; diplopia [   ]; vision changes [  ];  Amaurosis fugax[  ]; Resp: cough [  ];  wheezing[  ];  hemoptysis[  ]; shortness of breath[ y ]; paroxysmal nocturnal dyspnea[  ]; dyspnea on exertion[ y ]; or orthopnea[  ];  GI:  gallstones[  ], vomiting[  ];  dysphagia[  ]; melena[  ];  hematochezia [  ]; heartburn[  ];   Hx of  Colonoscopy[  ]; GU: kidney stones [  ]; hematuria[  ];   dysuria [  ];  nocturia[  ];  history of     obstruction [  ]; urinary frequency [  ]             Skin: rash, swelling[  ];, hair loss[  ];  peripheral edema[  ];  or itching[  ]; Musculosketetal: myalgias[  ];  joint swelling[  ];  joint erythema[  ];  joint pain[  ];  back pain[  ];  Heme/Lymph: bruising[  ];  bleeding[  ];  anemia[  ];  Neuro: TIA[  ];  headaches[  ];  stroke[  ];  vertigo[  ];  seizures[  ];   paresthesias[  ];  difficulty walking[y  ];  Psych:depression[  ]; anxiety[  ];  Endocrine: diabetes[y  ];  thyroid dysfunction[  ];  Immunizations: Flu up to date [  ]; Pneumococcal up to date [  ];  Other:     PHYSICAL EXAMINATION: Ht 6\' 1"  (1.854 m)   BMI 33.93 kg/m  General appearance: alert, cooperative, appears older than stated age and no distress Head: Normocephalic, without obvious abnormality, atraumatic Neck: no adenopathy, no carotid bruit, no JVD, supple, symmetrical, trachea midline and thyroid not enlarged, symmetric, no tenderness/mass/nodules Lymph nodes: Cervical, supraclavicular, and axillary nodes normal. Resp: clear to auscultation bilaterally Cardio: regular rate and rhythm, S1, S2 normal, no murmur, click, rub or gallop GI: soft, non-tender; bowel sounds normal; no masses,  no organomegaly Extremities: edema Mild pedal edema left greater than right Neurologic: Grossly normal  Diagnostic Studies & Laboratory data:     Recent Radiology Findings:   Ct Chest Wo Contrast  Result Date:  09/12/2018 CLINICAL DATA:  Follow-up pericardial effusion seen on ECHO. Chronic shortness of breath. Cough. EXAM: CT CHEST WITHOUT CONTRAST TECHNIQUE: Multidetector CT imaging of the chest was performed following the standard protocol without IV contrast. COMPARISON:  Chest x-ray 07/23/2017.  Chest CT 05/26/2015 FINDINGS: Cardiovascular: Moderate to large pericardial effusion noted, slightly increased since 2016 CT. Diffuse severe coronary artery calcifications. Mild cardiomegaly. Aorta is normal caliber. Mediastinum/Nodes: No mediastinal, hilar, or axillary adenopathy. Lungs/Pleura: Linear areas of scarring in the lung bases. No effusions. No confluent opacities or suspicious pulmonary nodules. Upper Abdomen: Imaging into the upper abdomen shows no acute findings. Gallstones noted within the gallbladder. Musculoskeletal: Chest wall soft tissues are unremarkable. No acute bony abnormality. IMPRESSION: Moderate to large sized pericardial effusion, increased since prior CT in 2016. Cardiomegaly, diffuse severe coronary artery disease. Cholelithiasis. Electronically Signed   By: Rolm Baptise M.D.   On: 09/12/2018 08:19     I have independently reviewed the above radiology studies  and reviewed the findings with the patient.   ECHO: [address], [place] [id number]                            [phone]  ------------------------------------------------------------------- Transthoracic Echocardiography  Patient:    Eugene [name], Eugene [name] MR #:       [id number] Study Date: 06/13/2018 Gender:     M Age:        24 Height:     185.4 cm Weight:     113.4 kg BSA:        2.45 m^2 Pt. Status: Room:   [place], MD  [place], MD  [place], MD  PERFORMING   [facility], Forestine Na  SONOGRAPHER  [name],  RCS  cc:  ------------------------------------------------------------------- LV EF: 50% -   55%  ------------------------------------------------------------------- Indications:      Pericardial effusion 423.9.  ------------------------------------------------------------------- History:   PMH:  Acquired from the patient  and from the patient&'s chart.  PMH:  Pericardial effusion. Chronic diastolic CHF (congestive heart failure) CAD- MI/RCA PCI-DES x2 2012, and RCA DES Jan 2014. Obstructive sleep apnea- on C-pap.  Myocardial infarction.  Risk factors:  Hypertension. Diabetes mellitus. Dyslipidemia.  ------------------------------------------------------------------- Study Conclusions  - Left ventricle: The cavity size was normal. Wall thickness was   increased in a pattern of severe LVH. Systolic function was   normal. The estimated ejection fraction was in the range of 50%   to 55%. Doppler parameters are consistent with abnormal left   ventricular relaxation (grade 1 diastolic dysfunction). Doppler   parameters are consistent with high ventricular filling pressure. - Regional wall motion abnormality: Hypokinesis of the apical   septal, apical lateral, and apical myocardium. - Aortic valve: Mildly calcified annulus. Trileaflet; normal   thickness leaflets. - Mitral valve: Mildly calcified annulus. Mildly thickened leaflets   . - Right ventricle: The cavity size was mildly dilated. - Pericardium, extracardiac: There is a large circumferential   pericardial effusion measuring 3.6 cm adjacent to the LV where it   is most prominent. There is mild buckling of the RA without clear   collapse. There is no RV collapse.There is no significant   respiratory variation across the mitral valve. Overall findings   do not support tamponade physiology. There is 15% respiratory   variation across the mitral valve. Effusion looks to have   increased in size compared to 01/08/18,  primarily adjacent to the   left ventricle best seen in the parasternal short axis and apical   4 chamber views. - Limited study to evaluate pericardial effusion.  ------------------------------------------------------------------- Study data:  Comparison was made to the study of 01/08/2018.  Study status:  Routine.  Procedure:  The patient reported no pain pre or post test. Transthoracic echocardiography. Image quality was adequate.  Study completion:  There were no complications. Transthoracic echocardiography.  M-mode, limited 2D, limited spectral Doppler, and color Doppler.  Birthdate:  Patient birthdate: 26-Apr-1956.  Age:  Patient is 63 yr old.  Sex:  Gender: male.    BMI: 33 kg/m^2.  Blood pressure:     137/74  Patient status:  Inpatient.  Study date:  Study date: 06/13/2018. Study time: 01:01 PM.  Location:  Echo laboratory.  -------------------------------------------------------------------  ------------------------------------------------------------------- Left ventricle:  The cavity size was normal. Wall thickness was increased in a pattern of severe LVH. Systolic function was normal. The estimated ejection fraction was in the range of 50% to 55%. Regional wall motion abnormalities:  Hypokinesis of the apical septal, apical lateral, and apical myocardium. Doppler parameters are consistent with abnormal left ventricular relaxation (grade 1 diastolic dysfunction). Doppler parameters are consistent with high ventricular filling pressure.  ------------------------------------------------------------------- Aortic valve:   Mildly calcified annulus. Trileaflet; normal thickness leaflets.  ------------------------------------------------------------------- Mitral valve:   Mildly calcified annulus. Mildly thickened leaflets .  Doppler:   There was no evidence for stenosis.   There was no significant regurgitation.    Peak gradient (D): 4 mm  Hg.  ------------------------------------------------------------------- Atrial septum:  Poorly visualized.  ------------------------------------------------------------------- Right ventricle:  The cavity size was mildly dilated.   ------------------------------------------------------------------- Pericardium:  There is a large circumferential pericardial effusion measuring 3.6 cm adjacent to the LV where it is most prominent. There is mild buckling of the RA without clear collapse. There is no RV collapse.There is no significant respiratory variation across the mitral valve. Overall findings do not support tamponade physiology. There is 15% respiratory variation across the mitral valve. Effusion  looks to have increased in size compared to 01/08/18, primarily adjacent to the left ventricle best seen in the parasternal short axis and apical 4 chamber views.  ------------------------------------------------------------------- Systemic veins: Inferior vena cava: The vessel was dilated. The respirophasic diameter changes were blunted (< 50%), consistent with elevated central venous pressure.  ------------------------------------------------------------------- Measurements   Left ventricle                      Value        Reference  LV ID, ED, PLAX chordal     (L)     42    mm     43 - 52  LV ID, ES, PLAX chordal             28.7  mm     23 - 38  LV PW thickness, ED                 16.1  mm     ---------  IVS/LV PW ratio, ED                 0.98         <=1.3  LV E/e&', average                    15.64        ---------    Ventricular septum                  Value        Reference  IVS thickness, ED                   15.7  mm     ---------    Aorta                               Value        Reference  Aortic root ID, ED                  35    mm     ---------    Left atrium                         Value        Reference  LA ID, A-P, ES                      48    mm      ---------  LA ID/bsa, A-P                      1.96  cm/m^2 <=2.2    Mitral valve                        Value        Reference  Mitral E-wave peak velocity         93.6  cm/s   ---------  Mitral A-wave peak velocity         101   cm/s   ---------  Mitral deceleration time            204   ms     150 - 230  Mitral peak gradient, D             4  mm Hg  ---------  Mitral E/A ratio, peak              0.9          ---------    Systemic veins                      Value        Reference  Estimated CVP                       15    mm Hg  ---------  Legend: (L)  and  (H)  mark values outside specified reference range.  ------------------------------------------------------------------- Prepared and Electronically Authenticated by  Kerry Hough, M.D. 2019-10-18T15:39:50  Recent Lab Findings: Lab Results  Component Value Date   WBC 5.0 07/23/2017   HGB 11.9 (L) 07/23/2017   HCT 37.3 (L) 07/23/2017   PLT 165 07/23/2017   GLUCOSE 388 (H) 07/28/2018   CHOL 179 06/16/2018   TRIG 225 (H) 06/16/2018   HDL 34 (L) 06/16/2018   LDLCALC 100 (H) 06/16/2018   ALT 18 06/16/2018   AST 21 06/16/2018   NA 135 07/28/2018   K 5.2 07/28/2018   CL 97 07/28/2018   CREATININE 1.85 (H) 07/28/2018   BUN 61 (H) 07/28/2018   CO2 22 07/28/2018   TSH 1.585 11/02/2016   INR 1.00 06/14/2016   HGBA1C 7.5 (A) 06/16/2018    Chronic Kidney Disease   Stage I     GFR >90  Stage II    GFR 60-89  Stage IIIA GFR 45-59  Stage IIIB GFR 30-44  Stage IV   GFR 15-29  Stage V    GFR  <15  Lab Results  Component Value Date   CREATININE 1.85 (H) 07/28/2018   CrCl cannot be calculated (Patient's most recent lab result is older than the maximum 21 days allowed.).  Assessment / Plan:  Moderate to large sized pericardial effusion, increased since prior CT in 2016. Recurrent pericardial effusion likely related to his renal disease Chronic renal insufficiency,  Severe diabetes with peripheral  neuropathy  I discussed with the patient the options of treatment, it has been 6 years since he was originally draining.  Options would include repeat subxiphoid window video-assisted thoracoscopy and drainage of pericardial effusion or sternotomy with pericardiectomy.  With the patient's very slow re accumulation, without evidence of malignancy involving the pericardium he is agreeable with proceeding with repeat subxiphoid pericardial window.  Risks and options of this were discussed with him in detail.  We will plan February 5 to proceed with subxiphoid pericardial window.  The patient remembered significant nausea following his drainage in 2014 we will address this with anesthesia and premedicate the patient for nausea preoperatively.   I  spent 45 minutes with  the patient face to face and greater then 50% of the time was spent in counseling and coordination of care.    Grace Isaac MD      [name] [place] [address] Office [phone]   Beeper [phone]  09/25/2018 9:23 AM

## 2018-09-29 ENCOUNTER — Encounter (HOSPITAL_COMMUNITY): Payer: Self-pay

## 2018-09-29 ENCOUNTER — Other Ambulatory Visit: Payer: Self-pay

## 2018-09-29 ENCOUNTER — Encounter (HOSPITAL_COMMUNITY)
Admission: RE | Admit: 2018-09-29 | Discharge: 2018-09-29 | Disposition: A | Discharge status: Home / Self Care | Payer: Medicare HMO | Source: Ambulatory Visit | Attending: Cardiothoracic Surgery | Admitting: Cardiothoracic Surgery

## 2018-09-29 ENCOUNTER — Ambulatory Visit (HOSPITAL_COMMUNITY)
Admission: RE | Admit: 2018-09-29 | Discharge: 2018-09-29 | Disposition: A | Discharge status: Home / Self Care | Payer: Medicare HMO | Source: Ambulatory Visit | Attending: Cardiothoracic Surgery | Admitting: Cardiothoracic Surgery

## 2018-09-29 DIAGNOSIS — E1122 Type 2 diabetes mellitus with diabetic chronic kidney disease: Secondary | ICD-10-CM | POA: Diagnosis present

## 2018-09-29 DIAGNOSIS — J449 Chronic obstructive pulmonary disease, unspecified: Secondary | ICD-10-CM | POA: Diagnosis present

## 2018-09-29 DIAGNOSIS — I110 Hypertensive heart disease with heart failure: Secondary | ICD-10-CM | POA: Diagnosis not present

## 2018-09-29 DIAGNOSIS — Z8249 Family history of ischemic heart disease and other diseases of the circulatory system: Secondary | ICD-10-CM | POA: Diagnosis not present

## 2018-09-29 DIAGNOSIS — F419 Anxiety disorder, unspecified: Secondary | ICD-10-CM | POA: Diagnosis present

## 2018-09-29 DIAGNOSIS — Z13220 Encounter for screening for lipoid disorders: Secondary | ICD-10-CM | POA: Diagnosis not present

## 2018-09-29 DIAGNOSIS — E1165 Type 2 diabetes mellitus with hyperglycemia: Secondary | ICD-10-CM | POA: Diagnosis not present

## 2018-09-29 DIAGNOSIS — Z01818 Encounter for other preprocedural examination: Secondary | ICD-10-CM

## 2018-09-29 DIAGNOSIS — K3184 Gastroparesis: Secondary | ICD-10-CM | POA: Diagnosis present

## 2018-09-29 DIAGNOSIS — Z452 Encounter for adjustment and management of vascular access device: Secondary | ICD-10-CM | POA: Diagnosis not present

## 2018-09-29 DIAGNOSIS — J45909 Unspecified asthma, uncomplicated: Secondary | ICD-10-CM | POA: Diagnosis not present

## 2018-09-29 DIAGNOSIS — I5032 Chronic diastolic (congestive) heart failure: Secondary | ICD-10-CM | POA: Diagnosis present

## 2018-09-29 DIAGNOSIS — E113393 Type 2 diabetes mellitus with moderate nonproliferative diabetic retinopathy without macular edema, bilateral: Secondary | ICD-10-CM | POA: Diagnosis not present

## 2018-09-29 DIAGNOSIS — I130 Hypertensive heart and chronic kidney disease with heart failure and stage 1 through stage 4 chronic kidney disease, or unspecified chronic kidney disease: Secondary | ICD-10-CM | POA: Diagnosis present

## 2018-09-29 DIAGNOSIS — I10 Essential (primary) hypertension: Secondary | ICD-10-CM | POA: Diagnosis not present

## 2018-09-29 DIAGNOSIS — E1143 Type 2 diabetes mellitus with diabetic autonomic (poly)neuropathy: Secondary | ICD-10-CM | POA: Diagnosis present

## 2018-09-29 DIAGNOSIS — Z955 Presence of coronary angioplasty implant and graft: Secondary | ICD-10-CM | POA: Diagnosis not present

## 2018-09-29 DIAGNOSIS — I440 Atrioventricular block, first degree: Secondary | ICD-10-CM | POA: Diagnosis present

## 2018-09-29 DIAGNOSIS — E785 Hyperlipidemia, unspecified: Secondary | ICD-10-CM | POA: Diagnosis present

## 2018-09-29 DIAGNOSIS — I313 Pericardial effusion (noninflammatory): Secondary | ICD-10-CM | POA: Diagnosis present

## 2018-09-29 DIAGNOSIS — Z888 Allergy status to other drugs, medicaments and biological substances status: Secondary | ICD-10-CM | POA: Diagnosis not present

## 2018-09-29 DIAGNOSIS — R0989 Other specified symptoms and signs involving the circulatory and respiratory systems: Secondary | ICD-10-CM | POA: Diagnosis not present

## 2018-09-29 DIAGNOSIS — Z885 Allergy status to narcotic agent status: Secondary | ICD-10-CM | POA: Diagnosis not present

## 2018-09-29 DIAGNOSIS — Z794 Long term (current) use of insulin: Secondary | ICD-10-CM | POA: Diagnosis not present

## 2018-09-29 DIAGNOSIS — I428 Other cardiomyopathies: Secondary | ICD-10-CM | POA: Diagnosis present

## 2018-09-29 DIAGNOSIS — I252 Old myocardial infarction: Secondary | ICD-10-CM | POA: Diagnosis not present

## 2018-09-29 DIAGNOSIS — E1142 Type 2 diabetes mellitus with diabetic polyneuropathy: Secondary | ICD-10-CM | POA: Diagnosis present

## 2018-09-29 DIAGNOSIS — I5033 Acute on chronic diastolic (congestive) heart failure: Secondary | ICD-10-CM | POA: Diagnosis not present

## 2018-09-29 DIAGNOSIS — I2510 Atherosclerotic heart disease of native coronary artery without angina pectoris: Secondary | ICD-10-CM | POA: Diagnosis present

## 2018-09-29 DIAGNOSIS — N183 Chronic kidney disease, stage 3 (moderate): Secondary | ICD-10-CM | POA: Diagnosis present

## 2018-09-29 DIAGNOSIS — G4733 Obstructive sleep apnea (adult) (pediatric): Secondary | ICD-10-CM | POA: Diagnosis present

## 2018-09-29 DIAGNOSIS — K219 Gastro-esophageal reflux disease without esophagitis: Secondary | ICD-10-CM | POA: Diagnosis present

## 2018-09-29 DIAGNOSIS — R0602 Shortness of breath: Secondary | ICD-10-CM | POA: Diagnosis not present

## 2018-09-29 DIAGNOSIS — Z7982 Long term (current) use of aspirin: Secondary | ICD-10-CM | POA: Diagnosis not present

## 2018-09-29 DIAGNOSIS — Z833 Family history of diabetes mellitus: Secondary | ICD-10-CM | POA: Diagnosis not present

## 2018-09-29 LAB — URINALYSIS, ROUTINE W REFLEX MICROSCOPIC
Bacteria, UA: NONE SEEN
Bilirubin Urine: NEGATIVE
Glucose, UA: 150 mg/dL — AB
Ketones, ur: NEGATIVE mg/dL
Leukocytes, UA: NEGATIVE
Nitrite: NEGATIVE
Protein, ur: 100 mg/dL — AB
Specific Gravity, Urine: 1.01 (ref 1.005–1.030)
pH: 5 (ref 5.0–8.0)

## 2018-09-29 LAB — GLUCOSE, CAPILLARY: Glucose-Capillary: 263 mg/dL — ABNORMAL HIGH (ref 70–99)

## 2018-09-29 LAB — BLOOD GAS, ARTERIAL
Acid-Base Excess: 1 mmol/L (ref 0.0–2.0)
Bicarbonate: 25.3 mmol/L (ref 20.0–28.0)
Drawn by: 421801
FIO2: 0.21
O2 Saturation: 96.4 %
Patient temperature: 98.6
pCO2 arterial: 42.1 mmHg (ref 32.0–48.0)
pH, Arterial: 7.396 (ref 7.350–7.450)
pO2, Arterial: 83.5 mmHg (ref 83.0–108.0)

## 2018-09-29 LAB — COMPREHENSIVE METABOLIC PANEL
ALT: 23 U/L (ref 0–44)
AST: 27 U/L (ref 15–41)
Albumin: 5.4 g/dL — ABNORMAL HIGH (ref 3.5–5.0)
Alkaline Phosphatase: 44 U/L (ref 38–126)
Anion gap: 12 (ref 5–15)
BUN: 43 mg/dL — ABNORMAL HIGH (ref 8–23)
CO2: 22 mmol/L (ref 22–32)
Calcium: 9.4 mg/dL (ref 8.9–10.3)
Chloride: 101 mmol/L (ref 98–111)
Creatinine, Ser: 1.77 mg/dL — ABNORMAL HIGH (ref 0.61–1.24)
GFR calc Af Amer: 47 mL/min — ABNORMAL LOW (ref >60)
GFR calc non Af Amer: 41 mL/min — ABNORMAL LOW (ref >60)
Glucose, Bld: 261 mg/dL — ABNORMAL HIGH (ref 70–99)
Potassium: 4.9 mmol/L (ref 3.5–5.1)
Sodium: 135 mmol/L (ref 135–145)
Total Bilirubin: 0.5 mg/dL (ref 0.3–1.2)
Total Protein: 6.9 g/dL (ref 6.5–8.1)

## 2018-09-29 LAB — CBC
HCT: 38.3 % — ABNORMAL LOW (ref 39.0–52.0)
Hemoglobin: 12.1 g/dL — ABNORMAL LOW (ref 13.0–17.0)
MCH: 29.3 pg (ref 26.0–34.0)
MCHC: 31.6 g/dL (ref 30.0–36.0)
MCV: 92.7 fL (ref 80.0–100.0)
Platelets: 173 10*3/uL (ref 150–400)
RBC: 4.13 MIL/uL — ABNORMAL LOW (ref 4.22–5.81)
RDW: 13.4 % (ref 11.5–15.5)
WBC: 6.2 10*3/uL (ref 4.0–10.5)
nRBC: 0 % (ref 0.0–0.2)

## 2018-09-29 LAB — APTT: aPTT: 28 seconds (ref 24–36)

## 2018-09-29 LAB — TYPE AND SCREEN
ABO/RH(D): O POS
Antibody Screen: NEGATIVE

## 2018-09-29 LAB — HEMOGLOBIN A1C
Hgb A1c MFr Bld: 9.4 % — ABNORMAL HIGH (ref 4.8–5.6)
Mean Plasma Glucose: 223.08 mg/dL

## 2018-09-29 LAB — SURGICAL PCR SCREEN
MRSA, PCR: NEGATIVE
Staphylococcus aureus: NEGATIVE

## 2018-09-29 LAB — PROTIME-INR
INR: 1.07
Prothrombin Time: 13.8 seconds (ref 11.4–15.2)

## 2018-09-29 NOTE — Progress Notes (Addendum)
PCP - [name], MD Cardiologist - Kate Sable, MD  Chest x-ray - 09/29/2018 in [facility] EKG - 09/29/2018 in [facility]  Stress Test - 2013 in [facility] ECHO - 05/2018 in [facility]  Cardiac Cath - 08/2012 in [facility]  Sleep Study - Yes CPAP - Yes  Fasting Blood Sugar - 140 Checks Blood Sugar 1 times a day  Blood Thinner Instructions: n/a Aspirin Instructions: Aspirin 81 mg  Anesthesia review: Yes-Heart history  Patient denies shortness of breath, fever, cough and chest pain at PAT appointment  Patient verbalized understanding of instructions that were given to them at the PAT appointment. Patient was also instructed that they will need to review over the PAT instructions again at home before surgery.

## 2018-09-29 NOTE — Pre-Procedure Instructions (Signed)
[place]  09/29/2018      [place], [place] [address] Phone: [phone] Fax: [phone]  [facility] [place], [place] [address] Phone: [phone] Fax: [phone]    Your procedure is scheduled on October 01, 2018.  Report to [facility] Admitting at 630 AM.  Call this number if you have problems the morning of surgery:  [phone]   Remember:  Do not eat or drink after midnight.    Take these medicines the morning of surgery with A SIP OF WATER  Metoprolol tartrate (lopressor) Acetaminophen (tylenol)-If needed Allopurinol (zyloprim)-if needed Tramadol (Ultram)-if needed  Follow your surgeon's instructions on when to hold/resume aspirin.  If no instructions were given call the office to determine how they would like to you take aspirin   7 days prior to surgery STOP taking any Aleve, Naproxen, Ibuprofen, Motrin, Advil, Goody's, BC's, all herbal medications, fish oil, and all vitamins   WHAT DO I DO ABOUT MY DIABETES MEDICATION?   [name] Do not take oral diabetes medicines (pills) the morning of surgery.  . THE NIGHT BEFORE SURGERY, take 35 units of  Levemir insulin. (1/2 of your normal dose).  DO NOT take glimepiride (amaryl) the night before your surgery.       . THE MORNING OF SURGERY, take no insulin or oral diabetes medication  Reviewed and Endorsed by [facility] Patient Education Committee, August 2015  How to Manage Your Diabetes Before and After Surgery  Why is it important to control my blood sugar before and after surgery? . Improving blood sugar levels before and after surgery helps healing and can limit problems. . A way of improving blood sugar control is eating a healthy diet by: o  Eating less sugar and carbohydrates o  Increasing activity/exercise o  Talking with your doctor about  reaching your blood sugar goals . High blood sugars (greater than 180 mg/dL) can raise your risk of infections and slow your recovery, so you will need to focus on controlling your diabetes during the weeks before surgery. . Make sure that the doctor who takes care of your diabetes knows about your planned surgery including the date and location.  How do I manage my blood sugar before surgery? . Check your blood sugar at least 4 times a day, starting 2 days before surgery, to make sure that the level is not too high or low. o Check your blood sugar the morning of your surgery when you wake up and every 2 hours until you get to the Short Stay unit. . If your blood sugar is less than 70 mg/dL, you will need to treat for low blood sugar: o Do not take insulin. o Treat a low blood sugar (less than 70 mg/dL) with  cup of clear juice (cranberry or apple), 4 glucose tablets, OR glucose gel. Recheck blood sugar in 15 minutes after treatment (to make sure it is greater than 70 mg/dL). If your blood sugar is not greater than 70 mg/dL on recheck, call [phone] o  for further instructions. . Report your blood sugar to the short stay nurse when you get to Short Stay.  . If you are admitted to the hospital after surgery: o Your blood sugar will be checked by the staff and you will probably be given insulin after surgery (instead of oral diabetes  medicines) to make sure you have good blood sugar levels. o The goal for blood sugar control after surgery is 80-180 mg/dL.   [place]- Preparing For Surgery  Before surgery, you can play an important role. Because skin is not sterile, your skin needs to be as free of germs as possible. You can reduce the number of germs on your skin by washing with CHG (chlorahexidine gluconate) Soap before surgery.  CHG is an antiseptic cleaner which kills germs and bonds with the skin to continue killing germs even after washing.    Oral Hygiene is also important to  reduce your risk of infection.  Remember - BRUSH YOUR TEETH THE MORNING OF SURGERY WITH YOUR REGULAR TOOTHPASTE  Please do not use if you have an allergy to CHG or antibacterial soaps. If your skin becomes reddened/irritated stop using the CHG.  Do not shave (including legs and underarms) for at least 48 hours prior to first CHG shower. It is OK to shave your face.  Please follow these instructions carefully.   1. Shower the NIGHT BEFORE SURGERY and the MORNING OF SURGERY with CHG.   2. If you chose to wash your hair, wash your hair first as usual with your normal shampoo.  3. After you shampoo, rinse your hair and body thoroughly to remove the shampoo.  4. Use CHG as you would any other liquid soap. You can apply CHG directly to the skin and wash gently with a scrungie or a clean washcloth.   5. Apply the CHG Soap to your body ONLY FROM THE NECK DOWN.  Do not use on open wounds or open sores. Avoid contact with your eyes, ears, mouth and genitals (private parts). Wash Face and genitals (private parts)  with your normal soap.  6. Wash thoroughly, paying special attention to the area where your surgery will be performed.  7. Thoroughly rinse your body with warm water from the neck down.  8. DO NOT shower/wash with your normal soap after using and rinsing off the CHG Soap.  9. Pat yourself dry with a CLEAN TOWEL.  10. Wear CLEAN PAJAMAS to bed the night before surgery, wear comfortable clothes the morning of surgery  11. Place CLEAN SHEETS on your bed the night of your first shower and DO NOT SLEEP WITH PETS.  Day of Surgery:  Do not apply any deodorants/lotions.  Please wear clean clothes to the hospital/surgery center.   Remember to brush your teeth WITH YOUR REGULAR TOOTHPASTE.   Do not wear jewelry, make-up or nail polish.  Do not wear lotions, powders, or perfumes, or deodorant.  Men may shave face and neck.  Do not bring valuables to the hospital.  [facility] is not  responsible for any belongings or valuables.  Contacts, dentures or bridgework may not be worn into surgery.  Leave your suitcase in the car.  After surgery it may be brought to your room.  For patients admitted to the hospital, discharge time will be determined by your treatment team.  Patients discharged the day of surgery will not be allowed to drive home.   Please read over the fact sheets that you were given.

## 2018-09-30 ENCOUNTER — Encounter: Payer: Self-pay | Admitting: Family Medicine

## 2018-09-30 ENCOUNTER — Ambulatory Visit (INDEPENDENT_AMBULATORY_CARE_PROVIDER_SITE_OTHER): Payer: Medicare HMO | Admitting: Family Medicine

## 2018-09-30 VITALS — BP 136/72 | Ht 73.0 in | Wt 256.0 lb

## 2018-09-30 DIAGNOSIS — Z13220 Encounter for screening for lipoid disorders: Secondary | ICD-10-CM

## 2018-09-30 DIAGNOSIS — I313 Pericardial effusion (noninflammatory): Secondary | ICD-10-CM

## 2018-09-30 DIAGNOSIS — N183 Chronic kidney disease, stage 3 unspecified: Secondary | ICD-10-CM

## 2018-09-30 DIAGNOSIS — E1165 Type 2 diabetes mellitus with hyperglycemia: Secondary | ICD-10-CM | POA: Diagnosis not present

## 2018-09-30 DIAGNOSIS — E113393 Type 2 diabetes mellitus with moderate nonproliferative diabetic retinopathy without macular edema, bilateral: Secondary | ICD-10-CM | POA: Diagnosis not present

## 2018-09-30 DIAGNOSIS — E1122 Type 2 diabetes mellitus with diabetic chronic kidney disease: Secondary | ICD-10-CM

## 2018-09-30 DIAGNOSIS — Z794 Long term (current) use of insulin: Secondary | ICD-10-CM

## 2018-09-30 DIAGNOSIS — I3139 Other pericardial effusion (noninflammatory): Secondary | ICD-10-CM

## 2018-09-30 DIAGNOSIS — I10 Essential (primary) hypertension: Secondary | ICD-10-CM | POA: Diagnosis not present

## 2018-09-30 MED ORDER — ALPRAZOLAM 0.5 MG PO TABS: ORAL_TABLET | ORAL | 5 refills | Status: DC

## 2018-09-30 NOTE — Progress Notes (Signed)
Subjective:    Patient ID: Eugene [name], male    DOB: Nov 27, 1955, 63 y.o.   MRN: [id number]  HPI Patient is here today to follow up on his chronic health issues.  Diabetes:Glimepiride 1 mg once Qhs,Levemir 70 units Qhs.last A1c done on 09/29/2018 at 9.4 he states he has not been doing a good job watching his diet he has been eating a lot of starches he states he is taking his medications.  He feels he can get his diabetes under better control by dietary measures does not want to increase any medications  Hypertension:Metoprolol 25 mg one bid,Isosorbide 60 mg daily,Cozaar 25 mg once one per day,toresemide 20 mg alt with 30 mg Patient follows up with cardiology on a regular basis Recently he has had pericardial effusion which is contributing with shortness of breath He has surgery coming up this week to help alleviate that  Gerd:Not on any medication  Hyperlipidemia:Fenobirate 160 mg once per day  Anxiety: She takes xanax 0.5mg  1/2 - 1 prn this patient states at times he finds himself a little anxious but not severely denies being depressed  Gout: Allopurinol 100 mg prn.  He states that he is kept it under good control recently and has not had any flareups  He sees Cardiologist Dr. Vallery Ridge for eyes, Nephrologist Dr.Sign here in [place].  He does try to eat heathy not able to exercise.  He relates over the past 6 weeks has not done a good job watching his diet  He has a surgery schedule for tomorrow on his heart.    Review of Systems  Constitutional: Negative for activity change.  HENT: Negative for congestion and rhinorrhea.   Respiratory: Negative for cough and shortness of breath.   Cardiovascular: Negative for chest pain.  Gastrointestinal: Negative for abdominal pain, diarrhea, nausea and vomiting.  Genitourinary: Negative for dysuria and hematuria.  Neurological: Negative for weakness and headaches.  Psychiatric/Behavioral: Negative for behavioral  problems and confusion.       Objective:   Physical Exam Vitals signs reviewed.  Constitutional:      General: He is not in acute distress. HENT:     Head: Normocephalic and atraumatic.  Eyes:     General:        Right eye: No discharge.        Left eye: No discharge.  Neck:     Trachea: No tracheal deviation.  Cardiovascular:     Rate and Rhythm: Normal rate and regular rhythm.     Heart sounds: Normal heart sounds. No murmur.  Pulmonary:     Effort: Pulmonary effort is normal. No respiratory distress.     Breath sounds: Normal breath sounds.  Lymphadenopathy:     Cervical: No cervical adenopathy.  Skin:    General: Skin is warm and dry.  Neurological:     Mental Status: He is alert.     Coordination: Coordination normal.  Psychiatric:        Behavior: Behavior normal.           Assessment & Plan:  Uncontrolled diabetes patient states he has not been doing a good job watching how he eats he states he is gained weight he is going to watch how he eats be more active try to lose weight he states he can get it under control without having to add more medicine he does not want to add more medicine at this time recheck labs in 3 months follow-up at that time send  [place] some readings in a few weeks  HTN- Patient was seen today as part of a visit regarding hypertension. The importance of healthy diet and regular physical activity was discussed. The importance of compliance with medications discussed.  Ideal goal is to keep blood pressure low elevated levels certainly below 301/60 when possible.  The patient was counseled that keeping blood pressure under control lessen his risk of complications.  The importance of regular follow-ups was discussed with the patient.  Low-salt diet such as DASH recommended.  Regular physical activity was recommended as well.  Patient was advised to keep regular follow-ups.  Pericardial effusion he is having some shortness of breath possibly  related to that possibly related to his cardiomyopathy he has surgery tomorrow should do well with this  Has retinopathy of the eyes being followed by specialist  25 minutes was spent with the patient.  This statement verifies that 25 minutes was indeed spent with the patient.  More than 50% of this visit-total duration of the visit-was spent in counseling and coordination of care. The issues that the patient came in for today as reflected in the diagnosis (s) please refer to documentation for further details.   Previous labs reviewed new labs ordered.  I went over his lab work from yesterday. He will do new lab work before his follow-up visit in 3 months

## 2018-09-30 NOTE — Progress Notes (Signed)
Anesthesia Chart Review:  Case:  [id number] Date/Time:  10/01/18 0815   Procedures:      REDO SUBXYPHOID PERICARDIAL WINDOW (N/A Chest)     TRANSESOPHAGEAL ECHOCARDIOGRAM (TEE) (N/A )   Anesthesia type:  General   Pre-op diagnosis:  Pericardial Effusion   Location:  [facility] OR ROOM 14 / [facility]   Surgeon:  Grace Isaac, MD      DISCUSSION: 63 yo male never smoker. Pertinent hx includes PONV, CKD III, COPD, CAD/MI (s/p DES to PDA and RCA), HFrEF, GERD, Pericardial effusion (s/p window in 2014, now with recurrence), OSA on CPAP, DMII with neuropathy, HTN.   Follows with Dr. Bronson Ing for CAD s/p stents to PDA and RCA. Nuclear stress test on 11/13/16 showed a large area of scar in the anterior wall and septum with a trivial region of partial reversibility toward the apex suggesting minor peri-infarct ischemia. Echocardiogram 01/08/2018 showed normal left ventricular systolic function, LVEF 50 to 55%, moderate LVH, moderate left atrial dilatation, large circumferential pericardial effusion most prominent adjacent to the left ventricle measuring 2.4 cm in diastole. Last seen by Dr. Bronson Ing 06/04/2018 and per notes he was symptomatically stable. CT Chest 09/11/2018 showed moderate to large sized pericardial effusion, increased since prior CT in 2016. He was referred back to CT surgery by Dr. Bronson Ing for management.  Per Dr. Everrett Coombe note, pt has stable DOE, unchanged over the past several years. Hx of pericardial effusion s/p window in 2014. He has been followed with serial echocardiograms, and is noted over the past year and a half to have slowly increasing pericardial effusion.  Recent CT of the chest without contrast showed no evidence of mediastinal mass or lung mass or other findings that may be a cause to his effusion.   Follows with Dr. Lowanda Foster for CKD III.  Anticipate he can proceed as planned barring acute status change.  VS: BP (!) 161/71   Pulse 68   Temp 36.6 C (Oral)   Resp 18   Ht  6\' 1"  (1.854 m)   Wt 114.3 kg   SpO2 97%   BMI 33.25 kg/m   PROVIDERS: [name], MD is PCP  Kate Sable, MD is Cardiologist  Fran Lowes, MD is Nephrologist  LABS: Labs reviewed: Acceptable for surgery. Poorly controlled DMII. Dr. Servando Snare aware. Creatinine c/w pt's CKD III. (all labs ordered are listed, but only abnormal results are displayed)  Labs Reviewed  GLUCOSE, CAPILLARY - Abnormal; Notable for the following components:      Result Value   Glucose-Capillary 263 (*)    All other components within normal limits  HEMOGLOBIN A1C - Abnormal; Notable for the following components:   Hgb A1c MFr Bld 9.4 (*)    All other components within normal limits  CBC - Abnormal; Notable for the following components:   RBC 4.13 (*)    Hemoglobin 12.1 (*)    HCT 38.3 (*)    All other components within normal limits  COMPREHENSIVE METABOLIC PANEL - Abnormal; Notable for the following components:   Glucose, Bld 261 (*)    BUN 43 (*)    Creatinine, Ser 1.77 (*)    Albumin 5.4 (*)    GFR calc non Af Amer 41 (*)    GFR calc Af Amer 47 (*)    All other components within normal limits  URINALYSIS, ROUTINE W REFLEX MICROSCOPIC - Abnormal; Notable for the following components:   Glucose, UA 150 (*)    Hgb urine dipstick SMALL (*)  Protein, ur 100 (*)    All other components within normal limits  SURGICAL PCR SCREEN  APTT  BLOOD GAS, ARTERIAL  PROTIME-INR  TYPE AND SCREEN     IMAGES: CHEST - 2 VIEW 09/29/2018  COMPARISON:  CT 09/11/2018  FINDINGS: Enlarged cardiac silhouette similar to comparison exam. No pulmonary effusion, infiltrate, or pneumothorax.  IMPRESSION: 1. Large cardiac silhouette corresponding to pericardial effusion on comparison CT. 2. No pulmonary edema or infiltrate.   EKG: 09/29/2018: Normal sinus rhythm. Rate 68. Incomplete right bundle branch block. Septal infarct , age undetermined. Inferior infarct , age undetermined. T wave  inversion Lateral leads. No significant change since last tracing  PULM: PFT 09/21/2014:  Ref Range & Units 26yr ago  FVC-Pre L 2.62   FVC-%Pred-Pre % 48   FVC-Post L 2.63   FVC-%Pred-Post % 49   FVC-%Change-Post % 0   FEV1-Pre L 2.24   FEV1-%Pred-Pre % 54   FEV1-Post L 2.35   FEV1-%Pred-Post % 57   FEV1-%Change-Post % 4   FEV6-Pre L 2.62   FEV6-%Pred-Pre % 50   FEV6-Post L 2.63   FEV6-%Pred-Post % 51   FEV6-%Change-Post % 0   Pre FEV1/FVC ratio % 86   FEV1FVC-%Pred-Pre % 112   Post FEV1/FVC ratio % 89   FEV1FVC-%Change-Post % 4   Pre FEV6/FVC Ratio % 100   FEV6FVC-%Pred-Pre % 104   Post FEV6/FVC ratio % 100   FEV6FVC-%Pred-Post % 104   FEV6FVC-%Change-Post % 0   FEF 25-75 Pre L/sec 3.32   FEF2575-%Pred-Pre % 98   FEF 25-75 Post L/sec 3.93   FEF2575-%Pred-Post % 116   FEF2575-%Change-Post % 18      CV: TTE 01/08/2018: Study Conclusions  - Left ventricle: The cavity size was normal. Wall thickness was   increased in a pattern of moderate LVH. Systolic function was   normal. The estimated ejection fraction was in the range of 50%   to 55%. - Left atrium: The atrium was moderately dilated. - Pericardium, extracardiac: There is a large circumferential   pericardial effusion, most prominent adjancent to the LV   measuring 2.4 cm in diastole. Overall findings are not consistent   with tamponade physiology by echo. Effusion appears stable   compared to 06/12/2017 study. - Limited study to evaluate pericardial effusion.  Nuclear stress 11/13/2016:  No diagnostic ST segment changes to indicate ischemia.  Large, severe intensity, fixed defect involving the mid to apical anterior, anteroseptal, septal, and inferoseptal myocardium most consistent with large infarct scar. There is a trivial region of partial reversibility toward the apex suggesting minor peri-infarct ischemia.  This is a high risk study based on perfusion defect size.  Nuclear stress EF: 45%.  Past  Medical History:  Diagnosis Date  . Anemia   . Anxiety   . Arteriosclerotic cardiovascular disease (ASCVD)    a. 05/2011 s/p DES to PDA and RCA. b. 08/2012 Inflat  STEMI/Cath/PCI: LM minor irregs, LAD 50p, D1 50, LCX nl, OM1 25, RCA 30-40p, 100d (treated with 2.75x46mm Promus Premier DES);    [name] Asthma   . Bell palsy   . C. difficile colitis    a. 08/2012  . Cholelithiasis 07/2012   Asymptomatic; identified incidentally  . Chronic diastolic CHF (congestive heart failure) ([place])   . Contrast dye induced nephropathy    a. 08/2012 post cath/pci  . COPD (chronic obstructive pulmonary disease) ([place])   . Diabetes mellitus    Peripheral neuropathy  . Gallstones   . GERD (gastroesophageal reflux disease)   .  Hyperlipidemia   . Hypertension   . Myocardial infarct ([place]) 09/08/12  . Nephrolithiasis   . Old myocardial infarction   . Pericardial effusion    a. s/p window in 2014.  [name] PONV (postoperative nausea and vomiting)   . Sleep apnea     Past Surgical History:  Procedure Laterality Date  . CARDIAC CATHETERIZATION    . CHEST TUBE INSERTION    . CIRCUMCISION    . COLONOSCOPY     In [facility], approximately 2011 per patient, was normal. Advised to come back in 10 years.  . ESOPHAGOGASTRODUODENOSCOPY     in [place] over 20 yrs ago  . ESOPHAGOGASTRODUODENOSCOPY  06/12/2012   RZN:BVAPOL esophagus-status post Venia Minks dilation. Abnormal gastric mucosa of uncertain significance-status post biopsy  . INTRAOPERATIVE TRANSESOPHAGEAL ECHOCARDIOGRAM N/A 06/23/2013   Procedure: INTRAOPERATIVE TRANSESOPHAGEAL ECHOCARDIOGRAM;  Surgeon: Grace Isaac, MD;  Location: [facility];  Service: Open Heart Surgery;  Laterality: N/A;  . LEFT HEART CATHETERIZATION WITH CORONARY ANGIOGRAM N/A 09/08/2012   Procedure: LEFT HEART CATHETERIZATION WITH CORONARY ANGIOGRAM;  Surgeon: [name], MD;  Location: [facility] CATH LAB;  Service: Cardiovascular;  Laterality: N/A;  . stents    . SUBXYPHOID  PERICARDIAL WINDOW N/A 06/23/2013   Procedure: SUBXYPHOID PERICARDIAL WINDOW;  Surgeon: Grace Isaac, MD;  Location: [facility] OR;  Service: Thoracic;  Laterality: N/A;    MEDICATIONS: . acetaminophen (TYLENOL) 500 MG tablet  . allopurinol (ZYLOPRIM) 100 MG tablet  . ALPRAZolam (XANAX) 0.5 MG tablet  . aspirin 81 MG chewable tablet  . Cholecalciferol (VITAMIN D3 PO)  . DROPLET PEN NEEDLES 32G X 6 MM MISC  . fenofibrate 160 MG tablet  . glimepiride (AMARYL) 1 MG tablet  . glucose blood (ACCU-CHEK SMARTVIEW) test strip  . Insulin Pen Needle (BD PEN NEEDLE NANO U/F) 32G X 4 MM MISC  . isosorbide mononitrate (IMDUR) 60 MG 24 hr tablet  . LEVEMIR FLEXTOUCH 100 UNIT/ML Pen  . losartan (COZAAR) 25 MG tablet  . metoprolol tartrate (LOPRESSOR) 25 MG tablet  . torsemide (DEMADEX) 20 MG tablet  . traMADol (ULTRAM) 50 MG tablet   No current facility-administered medications for this encounter.    Wynonia Musty [facility] Short Stay Center/Anesthesiology Phone [phone] 09/30/2018 9:48 AM

## 2018-09-30 NOTE — Anesthesia Preprocedure Evaluation (Addendum)
Anesthesia Evaluation  Patient identified by MRN, date of birth, ID band Patient awake    Reviewed: Allergy & Precautions, NPO status , Patient's Chart, lab work & pertinent test results, reviewed documented beta blocker date and time   History of Anesthesia Complications (+) PONV and history of anesthetic complications  Airway Mallampati: III  TM Distance: >3 FB Neck ROM: Full    Dental no notable dental hx.    Pulmonary asthma , sleep apnea and Continuous Positive Airway Pressure Ventilation , COPD,    Pulmonary exam normal breath sounds clear to auscultation       Cardiovascular hypertension, Pt. on medications and Pt. on home beta blockers + CAD, + Past MI, + Cardiac Stents (x 3) and +CHF  Normal cardiovascular exam Rhythm:Regular Rate:Normal  ECG: rate 68. Normal sinus rhythm Incomplete right bundle branch block  ECHO:  Left ventricle: The cavity size was normal. Wall thickness was increased in a pattern of severe LVH. Systolic function was normal. The estimated ejection fraction was in the range of 50% to 55%. Doppler parameters are consistent with abnormal left ventricular relaxation (grade 1 diastolic dysfunction). Doppler parameters are consistent with high ventricular filling pressure. Regional wall motion abnormality: Hypokinesis of the apical septal, apical lateral, and apical myocardium. Aortic valve: Mildly calcified annulus. Trileaflet; normal thickness leaflets. Mitral valve: Mildly calcified annulus. Mildly thickened leaflets Right ventricle: The cavity size was mildly dilated. Pericardium, extracardiac: There is a large circumferential pericardial effusion measuring 3.6 cm adjacent to the LV where it is most prominent. There is mild buckling of the RA without clear collapse. There is no RV collapse.There is no significant respiratory variation across the mitral valve. Overall findings do not support tamponade  physiology. There is 15% respiratory variation across the mitral valve. Effusion looks to have increased in size compared to 01/08/18, primarily adjacent to the left ventricle best seen in the parasternal short axis and apical 4 chamber views. Limited study to evaluate pericardial effusion.  Sees cardiologist   Neuro/Psych Anxiety Peripheral neuropathy    GI/Hepatic negative GI ROS, Neg liver ROS,   Endo/Other  diabetes, Insulin Dependent  Renal/GU Renal InsufficiencyRenal disease     Musculoskeletal negative musculoskeletal ROS (+)   Abdominal (+) + obese,   Peds  Hematology  (+) anemia , Gout   Anesthesia Other Findings Pericardial Effusion  Reproductive/Obstetrics                           Anesthesia Physical Anesthesia Plan  ASA: IV  Anesthesia Plan: General   Post-op Pain Management:    Induction: Intravenous  PONV Risk Score and Plan: 3 and Midazolam and Treatment may vary due to age or medical condition  Airway Management Planned: Oral ETT  Additional Equipment: Arterial line, CVP, TEE and Ultrasound Guidance Line Placement  Intra-op Plan:   Post-operative Plan: Extubation in OR and Possible Post-op intubation/ventilation  Informed Consent: I have reviewed the patients History and Physical, chart, labs and discussed the procedure including the risks, benefits and alternatives for the proposed anesthesia with the patient or authorized representative who has indicated his/her understanding and acceptance.     Dental advisory given  Plan Discussed with: CRNA  Anesthesia Plan Comments: (Reviewed PAT note by Karoline Caldwell, PA-C TEE for intraoperative monitoring only  )      Anesthesia Quick Evaluation

## 2018-10-01 ENCOUNTER — Encounter (HOSPITAL_COMMUNITY): Payer: Self-pay | Admitting: General Practice

## 2018-10-01 ENCOUNTER — Inpatient Hospital Stay (HOSPITAL_COMMUNITY): Payer: Medicare HMO | Admitting: Physician Assistant

## 2018-10-01 ENCOUNTER — Inpatient Hospital Stay (HOSPITAL_COMMUNITY)
Admission: RE | Admit: 2018-10-01 | Discharge: 2018-10-04 | DRG: 271 | Disposition: A | Discharge status: Home / Self Care | Payer: Medicare HMO | Attending: Cardiothoracic Surgery | Admitting: Cardiothoracic Surgery

## 2018-10-01 ENCOUNTER — Other Ambulatory Visit: Payer: Self-pay

## 2018-10-01 ENCOUNTER — Inpatient Hospital Stay (HOSPITAL_COMMUNITY): Payer: Medicare HMO

## 2018-10-01 ENCOUNTER — Inpatient Hospital Stay (HOSPITAL_COMMUNITY): Payer: Medicare HMO | Admitting: Certified Registered Nurse Anesthetist

## 2018-10-01 ENCOUNTER — Encounter (HOSPITAL_COMMUNITY)
Admission: RE | Disposition: A | Discharge status: Home / Self Care | Payer: Self-pay | Source: Home / Self Care | Attending: Cardiothoracic Surgery

## 2018-10-01 DIAGNOSIS — I252 Old myocardial infarction: Secondary | ICD-10-CM

## 2018-10-01 DIAGNOSIS — Z9889 Other specified postprocedural states: Secondary | ICD-10-CM | POA: Diagnosis present

## 2018-10-01 DIAGNOSIS — E1122 Type 2 diabetes mellitus with diabetic chronic kidney disease: Secondary | ICD-10-CM | POA: Diagnosis present

## 2018-10-01 DIAGNOSIS — Z09 Encounter for follow-up examination after completed treatment for conditions other than malignant neoplasm: Secondary | ICD-10-CM

## 2018-10-01 DIAGNOSIS — Z8249 Family history of ischemic heart disease and other diseases of the circulatory system: Secondary | ICD-10-CM | POA: Diagnosis not present

## 2018-10-01 DIAGNOSIS — I428 Other cardiomyopathies: Secondary | ICD-10-CM | POA: Diagnosis present

## 2018-10-01 DIAGNOSIS — Z888 Allergy status to other drugs, medicaments and biological substances status: Secondary | ICD-10-CM | POA: Diagnosis not present

## 2018-10-01 DIAGNOSIS — E1143 Type 2 diabetes mellitus with diabetic autonomic (poly)neuropathy: Secondary | ICD-10-CM | POA: Diagnosis present

## 2018-10-01 DIAGNOSIS — I440 Atrioventricular block, first degree: Secondary | ICD-10-CM | POA: Diagnosis present

## 2018-10-01 DIAGNOSIS — I130 Hypertensive heart and chronic kidney disease with heart failure and stage 1 through stage 4 chronic kidney disease, or unspecified chronic kidney disease: Secondary | ICD-10-CM | POA: Diagnosis present

## 2018-10-01 DIAGNOSIS — Z885 Allergy status to narcotic agent status: Secondary | ICD-10-CM

## 2018-10-01 DIAGNOSIS — J449 Chronic obstructive pulmonary disease, unspecified: Secondary | ICD-10-CM | POA: Diagnosis present

## 2018-10-01 DIAGNOSIS — K3184 Gastroparesis: Secondary | ICD-10-CM | POA: Diagnosis present

## 2018-10-01 DIAGNOSIS — E785 Hyperlipidemia, unspecified: Secondary | ICD-10-CM | POA: Diagnosis present

## 2018-10-01 DIAGNOSIS — Z79891 Long term (current) use of opiate analgesic: Secondary | ICD-10-CM

## 2018-10-01 DIAGNOSIS — Z794 Long term (current) use of insulin: Secondary | ICD-10-CM | POA: Diagnosis not present

## 2018-10-01 DIAGNOSIS — K219 Gastro-esophageal reflux disease without esophagitis: Secondary | ICD-10-CM | POA: Diagnosis present

## 2018-10-01 DIAGNOSIS — I313 Pericardial effusion (noninflammatory): Secondary | ICD-10-CM | POA: Diagnosis present

## 2018-10-01 DIAGNOSIS — E1142 Type 2 diabetes mellitus with diabetic polyneuropathy: Secondary | ICD-10-CM | POA: Diagnosis present

## 2018-10-01 DIAGNOSIS — Z7982 Long term (current) use of aspirin: Secondary | ICD-10-CM

## 2018-10-01 DIAGNOSIS — N183 Chronic kidney disease, stage 3 (moderate): Secondary | ICD-10-CM | POA: Diagnosis present

## 2018-10-01 DIAGNOSIS — I5032 Chronic diastolic (congestive) heart failure: Secondary | ICD-10-CM | POA: Diagnosis present

## 2018-10-01 DIAGNOSIS — Z833 Family history of diabetes mellitus: Secondary | ICD-10-CM

## 2018-10-01 DIAGNOSIS — F419 Anxiety disorder, unspecified: Secondary | ICD-10-CM | POA: Diagnosis present

## 2018-10-01 DIAGNOSIS — I2510 Atherosclerotic heart disease of native coronary artery without angina pectoris: Secondary | ICD-10-CM | POA: Diagnosis present

## 2018-10-01 DIAGNOSIS — G4733 Obstructive sleep apnea (adult) (pediatric): Secondary | ICD-10-CM | POA: Diagnosis present

## 2018-10-01 DIAGNOSIS — Z955 Presence of coronary angioplasty implant and graft: Secondary | ICD-10-CM

## 2018-10-01 DIAGNOSIS — Z79899 Other long term (current) drug therapy: Secondary | ICD-10-CM

## 2018-10-01 HISTORY — PX: SUBXYPHOID PERICARDIAL WINDOW: SHX5075

## 2018-10-01 HISTORY — PX: TEE WITHOUT CARDIOVERSION: SHX5443

## 2018-10-01 LAB — GLUCOSE, CAPILLARY
Glucose-Capillary: 266 mg/dL — ABNORMAL HIGH (ref 70–99)
Glucose-Capillary: 224 mg/dL — ABNORMAL HIGH (ref 70–99)
Glucose-Capillary: 272 mg/dL — ABNORMAL HIGH (ref 70–99)
Glucose-Capillary: 242 mg/dL — ABNORMAL HIGH (ref 70–99)
Glucose-Capillary: 264 mg/dL — ABNORMAL HIGH (ref 70–99)

## 2018-10-01 LAB — BODY FLUID CELL COUNT WITH DIFFERENTIAL: Total Nucleated Cell Count, Fluid: 2 cu mm (ref 0–1000)

## 2018-10-01 SURGERY — CREATION, PERICARDIAL WINDOW, SUBXIPHOID APPROACH
Anesthesia: General | Site: Chest

## 2018-10-01 MED ORDER — FENTANYL CITRATE (PF) 100 MCG/2ML IJ SOLN
INTRAMUSCULAR | Status: DC | PRN
  Administered 2018-10-01 (×2): 50 ug via INTRAVENOUS

## 2018-10-01 MED ORDER — MIDAZOLAM HCL 2 MG/2ML IJ SOLN
INTRAMUSCULAR | Status: AC
  Filled 2018-10-01: qty 2

## 2018-10-01 MED ORDER — SUCCINYLCHOLINE CHLORIDE 200 MG/10ML IV SOSY
PREFILLED_SYRINGE | INTRAVENOUS | Status: AC
  Filled 2018-10-01: qty 10

## 2018-10-01 MED ORDER — SUCCINYLCHOLINE CHLORIDE 200 MG/10ML IV SOSY
PREFILLED_SYRINGE | INTRAVENOUS | Status: DC | PRN
  Administered 2018-10-01: 100 mg via INTRAVENOUS

## 2018-10-01 MED ORDER — HYDROMORPHONE HCL 1 MG/ML IJ SOLN
INTRAMUSCULAR | Status: AC
  Filled 2018-10-01: qty 1

## 2018-10-01 MED ORDER — HYDROMORPHONE HCL 1 MG/ML IJ SOLN
0.2500 mg | INTRAMUSCULAR | Status: DC | PRN
  Administered 2018-10-01 (×2): 0.5 mg via INTRAVENOUS

## 2018-10-01 MED ORDER — FENTANYL CITRATE (PF) 250 MCG/5ML IJ SOLN
INTRAMUSCULAR | Status: AC
  Filled 2018-10-01: qty 5

## 2018-10-01 MED ORDER — CEFAZOLIN SODIUM-DEXTROSE 2-4 GM/100ML-% IV SOLN
2.0000 g | INTRAVENOUS | Status: AC
  Administered 2018-10-01: 2 g via INTRAVENOUS
  Filled 2018-10-01: qty 100

## 2018-10-01 MED ORDER — ACETAMINOPHEN 10 MG/ML IV SOLN
INTRAVENOUS | Status: AC
  Filled 2018-10-01: qty 100

## 2018-10-01 MED ORDER — PROMETHAZINE HCL 25 MG/ML IJ SOLN: 6.2500 mg | INTRAMUSCULAR | Status: DC | PRN

## 2018-10-01 MED ORDER — ACETAMINOPHEN 500 MG PO TABS
1000.0000 mg | ORAL_TABLET | Freq: Four times a day (QID) | ORAL | Status: DC
  Administered 2018-10-01 – 2018-10-03 (×2): 1000 mg via ORAL
  Filled 2018-10-01 (×2): qty 2

## 2018-10-01 MED ORDER — SCOPOLAMINE 1 MG/3DAYS TD PT72
MEDICATED_PATCH | TRANSDERMAL | Status: DC | PRN
  Administered 2018-10-01: 1 via TRANSDERMAL

## 2018-10-01 MED ORDER — HYDRALAZINE HCL 20 MG/ML IJ SOLN
10.0000 mg | Freq: Four times a day (QID) | INTRAMUSCULAR | Status: DC | PRN
  Administered 2018-10-01: 10 mg via INTRAVENOUS
  Filled 2018-10-01: qty 1

## 2018-10-01 MED ORDER — EPHEDRINE 5 MG/ML INJ
INTRAVENOUS | Status: AC
  Filled 2018-10-01: qty 10

## 2018-10-01 MED ORDER — TORSEMIDE 20 MG PO TABS
30.0000 mg | ORAL_TABLET | ORAL | Status: DC
  Administered 2018-10-03: 30 mg via ORAL
  Filled 2018-10-01 (×2): qty 2

## 2018-10-01 MED ORDER — SUGAMMADEX SODIUM 200 MG/2ML IV SOLN
INTRAVENOUS | Status: DC | PRN
  Administered 2018-10-01: 232.2 mg via INTRAVENOUS

## 2018-10-01 MED ORDER — OXYCODONE HCL 5 MG PO TABS: 5.0000 mg | ORAL_TABLET | ORAL | Status: DC | PRN

## 2018-10-01 MED ORDER — PHENYLEPHRINE 40 MCG/ML (10ML) SYRINGE FOR IV PUSH (FOR BLOOD PRESSURE SUPPORT)
PREFILLED_SYRINGE | INTRAVENOUS | Status: AC
  Filled 2018-10-01: qty 10

## 2018-10-01 MED ORDER — INSULIN ASPART 100 UNIT/ML ~~LOC~~ SOLN
0.0000 [IU] | Freq: Four times a day (QID) | SUBCUTANEOUS | Status: DC
  Administered 2018-10-01: 12 [IU] via SUBCUTANEOUS
  Administered 2018-10-01: 8 [IU] via SUBCUTANEOUS
  Administered 2018-10-02: 12 [IU] via SUBCUTANEOUS
  Administered 2018-10-02 (×3): 8 [IU] via SUBCUTANEOUS
  Administered 2018-10-02: 16 [IU] via SUBCUTANEOUS
  Administered 2018-10-03: 4 [IU] via SUBCUTANEOUS
  Administered 2018-10-03: 8 [IU] via SUBCUTANEOUS
  Administered 2018-10-03: 16 [IU] via SUBCUTANEOUS
  Administered 2018-10-03: 8 [IU] via SUBCUTANEOUS

## 2018-10-01 MED ORDER — ENOXAPARIN SODIUM 40 MG/0.4ML ~~LOC~~ SOLN: 40.0000 mg | Freq: Every day | SUBCUTANEOUS | Status: DC

## 2018-10-01 MED ORDER — SENNOSIDES-DOCUSATE SODIUM 8.6-50 MG PO TABS
1.0000 | ORAL_TABLET | Freq: Every day | ORAL | Status: DC
  Filled 2018-10-01: qty 1

## 2018-10-01 MED ORDER — FAMOTIDINE IN NACL 20-0.9 MG/50ML-% IV SOLN
20.0000 mg | INTRAVENOUS | Status: AC
  Administered 2018-10-01: 20 mg via INTRAVENOUS
  Filled 2018-10-01: qty 50

## 2018-10-01 MED ORDER — ONDANSETRON HCL 4 MG/2ML IJ SOLN: 4.0000 mg | Freq: Four times a day (QID) | INTRAMUSCULAR | Status: DC | PRN

## 2018-10-01 MED ORDER — FENTANYL CITRATE (PF) 100 MCG/2ML IJ SOLN
25.0000 ug | INTRAMUSCULAR | Status: DC | PRN
  Administered 2018-10-01 – 2018-10-02 (×3): 25 ug via INTRAVENOUS
  Filled 2018-10-01 (×3): qty 2

## 2018-10-01 MED ORDER — GLIMEPIRIDE 1 MG PO TABS
1.0000 mg | ORAL_TABLET | Freq: Every day | ORAL | Status: DC
  Administered 2018-10-02 – 2018-10-03 (×2): 1 mg via ORAL
  Filled 2018-10-01 (×2): qty 1

## 2018-10-01 MED ORDER — DEXAMETHASONE SODIUM PHOSPHATE 10 MG/ML IJ SOLN
INTRAMUSCULAR | Status: AC
  Filled 2018-10-01: qty 1

## 2018-10-01 MED ORDER — ONDANSETRON HCL 4 MG/2ML IJ SOLN
4.0000 mg | Freq: Four times a day (QID) | INTRAMUSCULAR | Status: DC | PRN
  Administered 2018-10-01: 4 mg via INTRAVENOUS
  Filled 2018-10-01 (×2): qty 2

## 2018-10-01 MED ORDER — PROPOFOL 10 MG/ML IV BOLUS
INTRAVENOUS | Status: DC | PRN
  Administered 2018-10-01: 100 mg via INTRAVENOUS

## 2018-10-01 MED ORDER — MIDAZOLAM HCL 5 MG/5ML IJ SOLN
INTRAMUSCULAR | Status: DC | PRN
  Administered 2018-10-01 (×2): 1 mg via INTRAVENOUS

## 2018-10-01 MED ORDER — ROCURONIUM BROMIDE 10 MG/ML (PF) SYRINGE
PREFILLED_SYRINGE | INTRAVENOUS | Status: DC | PRN
  Administered 2018-10-01: 20 mg via INTRAVENOUS
  Administered 2018-10-01: 30 mg via INTRAVENOUS
  Administered 2018-10-01: 20 mg via INTRAVENOUS

## 2018-10-01 MED ORDER — PROPOFOL 10 MG/ML IV BOLUS
INTRAVENOUS | Status: AC
  Filled 2018-10-01: qty 40

## 2018-10-01 MED ORDER — ONDANSETRON HCL 4 MG/2ML IJ SOLN
INTRAMUSCULAR | Status: DC | PRN
  Administered 2018-10-01: 4 mg via INTRAVENOUS

## 2018-10-01 MED ORDER — ONDANSETRON HCL 4 MG/2ML IJ SOLN
INTRAMUSCULAR | Status: AC
  Filled 2018-10-01: qty 2

## 2018-10-01 MED ORDER — TRAMADOL HCL 50 MG PO TABS: 50.0000 mg | ORAL_TABLET | Freq: Four times a day (QID) | ORAL | Status: DC | PRN

## 2018-10-01 MED ORDER — SODIUM CHLORIDE 0.9 % IV SOLN
INTRAVENOUS | Status: DC | PRN
  Administered 2018-10-01: 25 ug/min via INTRAVENOUS

## 2018-10-01 MED ORDER — METOPROLOL TARTRATE 25 MG PO TABS
25.0000 mg | ORAL_TABLET | Freq: Two times a day (BID) | ORAL | Status: DC
  Administered 2018-10-01 – 2018-10-04 (×6): 25 mg via ORAL
  Filled 2018-10-01 (×6): qty 1

## 2018-10-01 MED ORDER — LACTATED RINGERS IV SOLN
INTRAVENOUS | Status: DC | PRN
  Administered 2018-10-01 (×2): via INTRAVENOUS

## 2018-10-01 MED ORDER — TORSEMIDE 20 MG PO TABS: 20.0000 mg | ORAL_TABLET | ORAL | Status: DC

## 2018-10-01 MED ORDER — LIDOCAINE 2% (20 MG/ML) 5 ML SYRINGE
INTRAMUSCULAR | Status: AC
  Filled 2018-10-01: qty 5

## 2018-10-01 MED ORDER — LOSARTAN POTASSIUM 25 MG PO TABS
25.0000 mg | ORAL_TABLET | Freq: Every day | ORAL | Status: DC
  Administered 2018-10-02: 25 mg via ORAL
  Filled 2018-10-01: qty 1

## 2018-10-01 MED ORDER — BISACODYL 5 MG PO TBEC
10.0000 mg | DELAYED_RELEASE_TABLET | Freq: Every day | ORAL | Status: DC
  Filled 2018-10-01 (×3): qty 2

## 2018-10-01 MED ORDER — 0.9 % SODIUM CHLORIDE (POUR BTL) OPTIME
TOPICAL | Status: DC | PRN
  Administered 2018-10-01: 2000 mL

## 2018-10-01 MED ORDER — DEXAMETHASONE SODIUM PHOSPHATE 10 MG/ML IJ SOLN
INTRAMUSCULAR | Status: DC | PRN
  Administered 2018-10-01: 4 mg via INTRAVENOUS

## 2018-10-01 MED ORDER — ACETAMINOPHEN 10 MG/ML IV SOLN
INTRAVENOUS | Status: DC | PRN
  Administered 2018-10-01: 1000 mg via INTRAVENOUS

## 2018-10-01 MED ORDER — POTASSIUM CHLORIDE 10 MEQ/50ML IV SOLN: 10.0000 meq | Freq: Every day | INTRAVENOUS | Status: DC | PRN

## 2018-10-01 MED ORDER — SODIUM CHLORIDE 0.9 % IV SOLN: INTRAVENOUS | Status: DC

## 2018-10-01 MED ORDER — TORSEMIDE 20 MG PO TABS
20.0000 mg | ORAL_TABLET | ORAL | Status: DC
  Administered 2018-10-02 – 2018-10-04 (×2): 20 mg via ORAL
  Filled 2018-10-01 (×3): qty 1

## 2018-10-01 MED ORDER — ACETAMINOPHEN 160 MG/5ML PO SOLN
1000.0000 mg | Freq: Four times a day (QID) | ORAL | Status: DC
  Administered 2018-10-01 – 2018-10-02 (×4): 1000 mg via ORAL
  Filled 2018-10-01 (×4): qty 40.6

## 2018-10-01 MED ORDER — FENOFIBRATE 160 MG PO TABS
160.0000 mg | ORAL_TABLET | Freq: Every day | ORAL | Status: DC
  Administered 2018-10-01 – 2018-10-03 (×3): 160 mg via ORAL
  Filled 2018-10-01 (×3): qty 1

## 2018-10-01 MED ORDER — ASPIRIN 81 MG PO CHEW
81.0000 mg | CHEWABLE_TABLET | Freq: Every day | ORAL | Status: DC
  Administered 2018-10-01 – 2018-10-03 (×3): 81 mg via ORAL
  Filled 2018-10-01 (×3): qty 1

## 2018-10-01 MED ORDER — ISOSORBIDE MONONITRATE ER 60 MG PO TB24
60.0000 mg | ORAL_TABLET | Freq: Every day | ORAL | Status: DC
  Administered 2018-10-02 – 2018-10-04 (×3): 60 mg via ORAL
  Filled 2018-10-01 (×3): qty 1

## 2018-10-01 SURGICAL SUPPLY — 44 items
CANISTER SUCT 3000ML PPV (MISCELLANEOUS) ×3 IMPLANT
CATH THORACIC 28FR (CATHETERS) IMPLANT
CATH THORACIC 28FR RT ANG (CATHETERS) IMPLANT
CLIP VESOCCLUDE MED 24/CT (CLIP) ×3 IMPLANT
CLIP VESOCCLUDE SM WIDE 24/CT (CLIP) ×3 IMPLANT
CONN ST 1/4X3/8  BEN (MISCELLANEOUS) ×1
CONN ST 1/4X3/8 BEN (MISCELLANEOUS) ×2 IMPLANT
CONT SPEC 4OZ CLIKSEAL STRL BL (MISCELLANEOUS) ×9 IMPLANT
COVER SURGICAL LIGHT HANDLE (MISCELLANEOUS) ×3 IMPLANT
COVER WAND RF STERILE (DRAPES) ×3 IMPLANT
DERMABOND ADVANCED (GAUZE/BANDAGES/DRESSINGS) ×1
DERMABOND ADVANCED .7 DNX12 (GAUZE/BANDAGES/DRESSINGS) ×2 IMPLANT
DRAIN CHANNEL 28F RND 3/8 FF (WOUND CARE) ×3 IMPLANT
DRAPE LAPAROSCOPIC ABDOMINAL (DRAPES) ×3 IMPLANT
DRSG AQUACEL AG ADV 3.5X 6 (GAUZE/BANDAGES/DRESSINGS) ×3 IMPLANT
ELECT REM PT RETURN 9FT ADLT (ELECTROSURGICAL) ×3
ELECTRODE REM PT RTRN 9FT ADLT (ELECTROSURGICAL) ×2 IMPLANT
GAUZE SPONGE 4X4 12PLY STRL (GAUZE/BANDAGES/DRESSINGS) IMPLANT
GAUZE SPONGE 4X4 12PLY STRL LF (GAUZE/BANDAGES/DRESSINGS) ×3 IMPLANT
GLOVE BIO SURGEON STRL SZ 6.5 (GLOVE) ×15 IMPLANT
HEMOSTAT POWDER SURGIFOAM 1G (HEMOSTASIS) IMPLANT
KIT BASIN OR (CUSTOM PROCEDURE TRAY) ×3 IMPLANT
KIT TURNOVER KIT B (KITS) ×3 IMPLANT
NS IRRIG 1000ML POUR BTL (IV SOLUTION) ×6 IMPLANT
PACK CHEST (CUSTOM PROCEDURE TRAY) ×3 IMPLANT
PAD ARMBOARD 7.5X6 YLW CONV (MISCELLANEOUS) ×6 IMPLANT
PAD ELECT DEFIB RADIOL ZOLL (MISCELLANEOUS) ×3 IMPLANT
SUT ETHIBOND 2 0 SH (SUTURE) ×1
SUT ETHIBOND 2 0 SH 36X2 (SUTURE) ×2 IMPLANT
SUT SILK 2 0 SH CR/8 (SUTURE) ×3 IMPLANT
SUT VIC AB 1 CTX 18 (SUTURE) ×3 IMPLANT
SUT VIC AB 2-0 CTX 27 (SUTURE) ×3 IMPLANT
SUT VIC AB 3-0 X1 27 (SUTURE) ×3 IMPLANT
SWAB COLLECTION DEVICE MRSA (MISCELLANEOUS) IMPLANT
SWAB CULTURE ESWAB REG 1ML (MISCELLANEOUS) IMPLANT
SYR 10ML LL (SYRINGE) IMPLANT
SYR 50ML SLIP (SYRINGE) IMPLANT
SYSTEM SAHARA CHEST DRAIN ATS (WOUND CARE) ×3 IMPLANT
TAPE CLOTH SURG 4X10 WHT LF (GAUZE/BANDAGES/DRESSINGS) ×3 IMPLANT
TOWEL GREEN STERILE (TOWEL DISPOSABLE) ×3 IMPLANT
TOWEL GREEN STERILE FF (TOWEL DISPOSABLE) ×3 IMPLANT
TRAP SPECIMEN MUCOUS 40CC (MISCELLANEOUS) ×6 IMPLANT
TRAY FOLEY SLVR 16FR TEMP STAT (SET/KITS/TRAYS/PACK) ×3 IMPLANT
WATER STERILE IRR 1000ML POUR (IV SOLUTION) ×6 IMPLANT

## 2018-10-01 NOTE — Transfer of Care (Signed)
Immediate Anesthesia Transfer of Care Note  Patient: Eugene [name]  Procedure(s) Performed: REDO SUBXYPHOID PERICARDIAL WINDOW (N/A Chest) TRANSESOPHAGEAL ECHOCARDIOGRAM (TEE) (N/A )  Patient Location: PACU  Anesthesia Type:General  Level of Consciousness: awake, drowsy and patient cooperative  Airway & Oxygen Therapy: Patient Spontanous Breathing and Patient connected to face mask oxygen  Post-op Assessment: Report given to RN, Post -op Vital signs reviewed and stable and Patient moving all extremities  Post vital signs: Reviewed and stable  Last Vitals:  Vitals Value Taken Time  BP 145/79 10/01/2018 10:27 AM  Temp    Pulse 64 10/01/2018 10:27 AM  Resp 15 10/01/2018 10:27 AM  SpO2 100 % 10/01/2018 10:27 AM  Vitals shown include unvalidated device data.  Last Pain:  Vitals:   10/01/18 0621  TempSrc: Oral         Complications: No apparent anesthesia complications

## 2018-10-01 NOTE — Progress Notes (Signed)
  Echocardiogram Echocardiogram Transesophageal has been performed.  Darlina Sicilian M 10/01/2018, 9:30 AM

## 2018-10-01 NOTE — H&P (Signed)
[address]       [place] [address]             [phone]                    Jeris W Pulley [place] Medical Record #[id number] Date of Birth: 1956/05/14  Referring: Herminio Commons, MD Primary Care: [name], MD Primary Cardiologist: Kate Sable, MD  Chief Complaint:    Chief Complaint  Patient presents with  . Pericardial Effusion    Surgical eval, Chest CT 09/12/18, ECHO 06/13/18, HX of subxiphoid pericardial window 05/2013    History of Present Illness:    Eugene [name] 62 y.o. male is seen in the office  today for recurrent pericardial effusion.  The patient had originally presented 6 years ago with a myocardial infarction and subsequent acute pericardial effusion requiring drainage.  Patient has severe diabetes with peripheral neuropathy, advanced stage renal insufficiency but not requiring dialysis.  He has been followed with serial echocardiograms, and is noted over the past year and a half to have slowly increasing pericardial effusion.  Recent CT of the chest without contrast showed no evidence of mediastinal mass or lung mass or other findings that may be a cause to his effusion.   The patient is somewhat limited by shortness of breath but this is been stable over the past several years.       Current Activity/ Functional Status:  Patient is independent with mobility/ambulation, transfers, ADL's, IADL's.   Zubrod Score: At the time of surgery this patient's most appropriate activity status/level should be described as: []     0    Normal activity, no symptoms [x]     1    Restricted in physical strenuous activity but ambulatory, able to do out light work []     2    Ambulatory and capable of self care, unable to do work activities, up and about               >50 % of waking hours                              []     3    Only limited self care, in bed greater than 50% of waking hours []     4    Completely disabled,  no self care, confined to bed or chair []     5    Moribund   Past Medical History:  Diagnosis Date  . Anemia   . Anxiety   . Arteriosclerotic cardiovascular disease (ASCVD)    a. 05/2011 s/p DES to PDA and RCA. b. 08/2012 Inflat  STEMI/Cath/PCI: LM minor irregs, LAD 50p, D1 50, LCX nl, OM1 25, RCA 30-40p, 100d (treated with 2.75x85mm Promus Premier DES);    [name] Asthma   . Bell palsy   . C. difficile colitis    a. 08/2012  . Cholelithiasis 07/2012   Asymptomatic; identified incidentally  . Chronic diastolic CHF (congestive heart failure) ([place])   . Contrast dye induced nephropathy    a. 08/2012 post cath/pci  . COPD (chronic obstructive pulmonary disease) ([place])   . Diabetes mellitus    Peripheral neuropathy  . Gallstones   . GERD (gastroesophageal reflux disease)   . Hyperlipidemia   . Hypertension   . Myocardial infarct ([place]) 09/08/12  . Nephrolithiasis   . Old myocardial infarction   .  Pericardial effusion    a. s/p window in 2014.  [name] PONV (postoperative nausea and vomiting)   . Sleep apnea     Past Surgical History:  Procedure Laterality Date  . CARDIAC CATHETERIZATION    . CHEST TUBE INSERTION    . CIRCUMCISION    . COLONOSCOPY     In [facility], approximately 2011 per patient, was normal. Advised to come back in 10 years.  . ESOPHAGOGASTRODUODENOSCOPY     in [place] over 20 yrs ago  . ESOPHAGOGASTRODUODENOSCOPY  06/12/2012   NLZ:JQBHAL esophagus-status post Venia Minks dilation. Abnormal gastric mucosa of uncertain significance-status post biopsy  . INTRAOPERATIVE TRANSESOPHAGEAL ECHOCARDIOGRAM N/A 06/23/2013   Procedure: INTRAOPERATIVE TRANSESOPHAGEAL ECHOCARDIOGRAM;  Surgeon: Grace Isaac, MD;  Location: [facility];  Service: Open Heart Surgery;  Laterality: N/A;  . LEFT HEART CATHETERIZATION WITH CORONARY ANGIOGRAM N/A 09/08/2012   Procedure: LEFT HEART CATHETERIZATION WITH CORONARY ANGIOGRAM;  Surgeon: [name], MD;  Location: [facility] CATH LAB;  Service:  Cardiovascular;  Laterality: N/A;  . stents    . SUBXYPHOID PERICARDIAL WINDOW N/A 06/23/2013   Procedure: SUBXYPHOID PERICARDIAL WINDOW;  Surgeon: Grace Isaac, MD;  Location[facility] OR;  Service: Thoracic;  Laterality: N/A;    Family History  Problem Relation Age of Onset  . Diabetes Mother   . Heart attack Mother   . Stroke Mother   . Diabetes Sister   . Sleep apnea Sister   . Hypertension Brother   . Diabetes Brother   . Diabetes Brother   . Hypertension Brother   . Colon cancer Neg Hx   . Liver disease Neg Hx      Social History   Tobacco Use  Smoking Status Never Smoker  Smokeless Tobacco Never Used    Social History   Substance and Sexual Activity  Alcohol Use No  . Alcohol/week: 0.0 standard drinks   Comment: heavy etoh use 30 years ago     Allergies  Allergen Reactions  . Hydrocodone Nausea And Vomiting  . Lisinopril Cough  . Neurontin [Gabapentin] Other (See Comments)    Reaction:  Suicidal thoughts   . Statins Other (See Comments)    Reaction:  Muscle pain   . Metformin And Related Diarrhea  . Norvasc [Amlodipine Besylate] Swelling and Other (See Comments)    Reaction:  Pedal edema    Current Facility-Administered Medications  Medication Dose Route Frequency Provider Last Rate Last Dose  . ceFAZolin (ANCEF) IVPB 2g/100 mL premix  2 g Intravenous 30 min Pre-Op Grace Isaac, MD        Pertinent items are noted in HPI.   Review of Systems:     Cardiac Review of Systems: [Y] = yes  or   [ N ] = no   Chest Pain [  n ]  Resting SOB [ n  ] Exertional SOB  [  y  Orthopnea Florencio.Farrier  ]   Pedal Edema [ y  ]    Palpitations [n  ] Syncope  [n  ]   Presyncope [  n ]   General Review of Systems: [Y] = yes [  ]=no Constitional: recent weight change [  ];  Wt loss over the last 3 months [   ] anorexia [  ]; fatigue [  ]; nausea [  ]; night sweats [  ]; fever [  ]; or chills [  ];           Eye : blurred vision [  ];  diplopia [   ]; vision changes [  ];   Amaurosis fugax[  ]; Resp: cough [  ];  wheezing[  ];  hemoptysis[  ]; shortness of breath[ y ]; paroxysmal nocturnal dyspnea[  ]; dyspnea on exertion[ y ]; or orthopnea[  ];  GI:  gallstones[  ], vomiting[  ];  dysphagia[  ]; melena[  ];  hematochezia [  ]; heartburn[  ];   Hx of  Colonoscopy[  ]; GU: kidney stones [  ]; hematuria[  ];   dysuria [  ];  nocturia[  ];  history of     obstruction [  ]; urinary frequency [  ]             Skin: rash, swelling[  ];, hair loss[  ];  peripheral edema[  ];  or itching[  ]; Musculosketetal: myalgias[  ];  joint swelling[  ];  joint erythema[  ];  joint pain[  ];  back pain[  ];  Heme/Lymph: bruising[  ];  bleeding[  ];  anemia[  ];  Neuro: TIA[  ];  headaches[  ];  stroke[  ];  vertigo[  ];  seizures[  ];   paresthesias[  ];  difficulty walking[y  ];  Psych:depression[  ]; anxiety[  ];  Endocrine: diabetes[y  ];  thyroid dysfunction[  ];  Immunizations: Flu up to date [  ]; Pneumococcal up to date [  ];  Other:     PHYSICAL EXAMINATION: BP (!) 188/100   Pulse 64   Temp 97.8 F (36.6 C) (Oral)   Resp 20   SpO2 98%  General appearance: alert, cooperative, appears older than stated age and no distress Head: Normocephalic, without obvious abnormality, atraumatic Neck: no adenopathy, no carotid bruit, no JVD, supple, symmetrical, trachea midline and thyroid not enlarged, symmetric, no tenderness/mass/nodules Lymph nodes: Cervical, supraclavicular, and axillary nodes normal. Resp: clear to auscultation bilaterally Cardio: regular rate and rhythm, S1, S2 normal, no murmur, click, rub or gallop GI: soft, non-tender; bowel sounds normal; no masses,  no organomegaly Extremities: edema Mild pedal edema left greater than right Neurologic: Grossly normal  Diagnostic Studies & Laboratory data:     Recent Radiology Findings:   Ct Chest Wo Contrast  Result Date: 09/12/2018 CLINICAL DATA:  Follow-up pericardial effusion seen on ECHO. Chronic shortness  of breath. Cough. EXAM: CT CHEST WITHOUT CONTRAST TECHNIQUE: Multidetector CT imaging of the chest was performed following the standard protocol without IV contrast. COMPARISON:  Chest x-ray 07/23/2017.  Chest CT 05/26/2015 FINDINGS: Cardiovascular: Moderate to large pericardial effusion noted, slightly increased since 2016 CT. Diffuse severe coronary artery calcifications. Mild cardiomegaly. Aorta is normal caliber. Mediastinum/Nodes: No mediastinal, hilar, or axillary adenopathy. Lungs/Pleura: Linear areas of scarring in the lung bases. No effusions. No confluent opacities or suspicious pulmonary nodules. Upper Abdomen: Imaging into the upper abdomen shows no acute findings. Gallstones noted within the gallbladder. Musculoskeletal: Chest wall soft tissues are unremarkable. No acute bony abnormality. IMPRESSION: Moderate to large sized pericardial effusion, increased since prior CT in 2016. Cardiomegaly, diffuse severe coronary artery disease. Cholelithiasis. Electronically Signed   By: Rolm Baptise M.D.   On: 09/12/2018 08:19     I have independently reviewed the above radiology studies  and reviewed the findings with the patient.   ECHO: [address]  [place], [place] [address]                            [phone]  ------------------------------------------------------------------- Transthoracic Echocardiography  Patient:    Eon, Zunker MR #:       [id number] Study Date: 06/13/2018 Gender:     M Age:        63 Height:     185.4 cm Weight:     113.4 kg BSA:        2.45 m^2 Pt. Status: Room:   [place], MD  [place], MD  [place], MD  PERFORMING   Chmg, Forestine Na  SONOGRAPHER  [name], RCS  cc:  ------------------------------------------------------------------- LV EF: 50% -    55%  ------------------------------------------------------------------- Indications:      Pericardial effusion 423.9.  ------------------------------------------------------------------- History:   PMH:  Acquired from the patient and from the patient&'s chart.  PMH:  Pericardial effusion. Chronic diastolic CHF (congestive heart failure) CAD- MI/RCA PCI-DES x2 2012, and RCA DES Jan 2014. Obstructive sleep apnea- on C-pap.  Myocardial infarction.  Risk factors:  Hypertension. Diabetes mellitus. Dyslipidemia.  ------------------------------------------------------------------- Study Conclusions  - Left ventricle: The cavity size was normal. Wall thickness was   increased in a pattern of severe LVH. Systolic function was   normal. The estimated ejection fraction was in the range of 50%   to 55%. Doppler parameters are consistent with abnormal left   ventricular relaxation (grade 1 diastolic dysfunction). Doppler   parameters are consistent with high ventricular filling pressure. - Regional wall motion abnormality: Hypokinesis of the apical   septal, apical lateral, and apical myocardium. - Aortic valve: Mildly calcified annulus. Trileaflet; normal   thickness leaflets. - Mitral valve: Mildly calcified annulus. Mildly thickened leaflets   . - Right ventricle: The cavity size was mildly dilated. - Pericardium, extracardiac: There is a large circumferential   pericardial effusion measuring 3.6 cm adjacent to the LV where it   is most prominent. There is mild buckling of the RA without clear   collapse. There is no RV collapse.There is no significant   respiratory variation across the mitral valve. Overall findings   do not support tamponade physiology. There is 15% respiratory   variation across the mitral valve. Effusion looks to have   increased in size compared to 01/08/18, primarily adjacent to the   left ventricle best seen in the parasternal short axis and apical   4 chamber  views. - Limited study to evaluate pericardial effusion.  ------------------------------------------------------------------- Study data:  Comparison was made to the study of 01/08/2018.  Study status:  Routine.  Procedure:  The patient reported no pain pre or post test. Transthoracic echocardiography. Image quality was adequate.  Study completion:  There were no complications. Transthoracic echocardiography.  M-mode, limited 2D, limited spectral Doppler, and color Doppler.  Birthdate:  Patient birthdate: 07/29/1956.  Age:  Patient is 63 yr old.  Sex:  Gender: male.    BMI: 33 kg/m^2.  Blood pressure:     137/74  Patient status:  Inpatient.  Study date:  Study date: 06/13/2018. Study time: 01:01 PM.  Location:  Echo laboratory.  -------------------------------------------------------------------  ------------------------------------------------------------------- Left ventricle:  The cavity size was normal. Wall thickness was increased in a pattern of severe LVH. Systolic function was normal. The estimated ejection fraction was in the range of 50% to 55%. Regional wall motion  abnormalities:  Hypokinesis of the apical septal, apical lateral, and apical myocardium. Doppler parameters are consistent with abnormal left ventricular relaxation (grade 1 diastolic dysfunction). Doppler parameters are consistent with high ventricular filling pressure.  ------------------------------------------------------------------- Aortic valve:   Mildly calcified annulus. Trileaflet; normal thickness leaflets.  ------------------------------------------------------------------- Mitral valve:   Mildly calcified annulus. Mildly thickened leaflets .  Doppler:   There was no evidence for stenosis.   There was no significant regurgitation.    Peak gradient (D): 4 mm Hg.  ------------------------------------------------------------------- Atrial septum:  Poorly  visualized.  ------------------------------------------------------------------- Right ventricle:  The cavity size was mildly dilated.   ------------------------------------------------------------------- Pericardium:  There is a large circumferential pericardial effusion measuring 3.6 cm adjacent to the LV where it is most prominent. There is mild buckling of the RA without clear collapse. There is no RV collapse.There is no significant respiratory variation across the mitral valve. Overall findings do not support tamponade physiology. There is 15% respiratory variation across the mitral valve. Effusion looks to have increased in size compared to 01/08/18, primarily adjacent to the left ventricle best seen in the parasternal short axis and apical 4 chamber views.  ------------------------------------------------------------------- Systemic veins: Inferior vena cava: The vessel was dilated. The respirophasic diameter changes were blunted (< 50%), consistent with elevated central venous pressure.  ------------------------------------------------------------------- Measurements   Left ventricle                      Value        Reference  LV ID, ED, PLAX chordal     (L)     42    mm     43 - 52  LV ID, ES, PLAX chordal             28.7  mm     23 - 38  LV PW thickness, ED                 16.1  mm     ---------  IVS/LV PW ratio, ED                 0.98         <=1.3  LV E/e&', average                    15.64        ---------    Ventricular septum                  Value        Reference  IVS thickness, ED                   15.7  mm     ---------    Aorta                               Value        Reference  Aortic root ID, ED                  35    mm     ---------    Left atrium                         Value        Reference  LA ID, A-P, ES  48    mm     ---------  LA ID/bsa, A-P                      1.96  cm/m^2 <=2.2    Mitral valve                         Value        Reference  Mitral E-wave peak velocity         93.6  cm/s   ---------  Mitral A-wave peak velocity         101   cm/s   ---------  Mitral deceleration time            204   ms     150 - 230  Mitral peak gradient, D             4     mm Hg  ---------  Mitral E/A ratio, peak              0.9          ---------    Systemic veins                      Value        Reference  Estimated CVP                       15    mm Hg  ---------  Legend: (L)  and  (H)  mark values outside specified reference range.  ------------------------------------------------------------------- Prepared and Electronically Authenticated by  Kerry Hough, M.D. 2019-10-18T15:39:50  Recent Lab Findings: Lab Results  Component Value Date   WBC 6.2 09/29/2018   HGB 12.1 (L) 09/29/2018   HCT 38.3 (L) 09/29/2018   PLT 173 09/29/2018   GLUCOSE 261 (H) 09/29/2018   CHOL 179 06/16/2018   TRIG 225 (H) 06/16/2018   HDL 34 (L) 06/16/2018   LDLCALC 100 (H) 06/16/2018   ALT 23 09/29/2018   AST 27 09/29/2018   NA 135 09/29/2018   K 4.9 09/29/2018   CL 101 09/29/2018   CREATININE 1.77 (H) 09/29/2018   BUN 43 (H) 09/29/2018   CO2 22 09/29/2018   TSH 1.585 11/02/2016   INR 1.07 09/29/2018   HGBA1C 9.4 (H) 09/29/2018    Chronic Kidney Disease   Stage I     GFR >90  Stage II    GFR 60-89  Stage IIIA GFR 45-59  Stage IIIB GFR 30-44  Stage IV   GFR 15-29  Stage V    GFR  <15  Lab Results  Component Value Date   CREATININE 1.77 (H) 09/29/2018   Estimated Creatinine Clearance: 58.5 mL/min (A) (by C-G formula based on SCr of 1.77 mg/dL (H)).  Assessment / Plan:  Moderate to large sized pericardial effusion, increased since prior CT in 2016. Recurrent pericardial effusion likely related to his renal disease Chronic renal insufficiency,  Severe diabetes with peripheral neuropathy  I discussed with the patient the options of treatment, it has been 6 years since he was originally draining.   Options would include repeat subxiphoid window video-assisted thoracoscopy and drainage of pericardial effusion or sternotomy with pericardiectomy.  With the patient's very slow re accumulation, without evidence of malignancy involving the pericardium he is agreeable with proceeding with repeat subxiphoid pericardial window.  Risks and options of this were discussed with him  in detail.  We will plan February 5 to proceed with subxiphoid pericardial window.  The patient remembered significant nausea following his drainage in 2014 we will address this with anesthesia and premedicate the patient for nausea preoperatively.  The goals risks and alternatives of the planned surgical procedure Procedure(s): REDO SUBXYPHOID PERICARDIAL WINDOW (N/A) TRANSESOPHAGEAL ECHOCARDIOGRAM (TEE) (N/A)  have been discussed with the patient in detail. The risks of the procedure including death, infection, stroke, myocardial infarction, bleeding, blood transfusion have all been discussed specifically.  I have quoted Benson Norway Chanthavong a 2 % of perioperative mortality and a complication rate as high as 20 %. The patient's questions have been answered.Conal Shetley Witters is willing  to proceed with the planned procedure.   Grace Isaac MD      [address] [place] [address] Office [phone]   Beeper [phone]  10/01/2018 6:38 AM

## 2018-10-01 NOTE — Brief Op Note (Addendum)
      [address]       [place] [address]             [phone]      10/01/2018  10:02 AM  PATIENT:  Eugene [name]  63 y.o. male  PRE-OPERATIVE DIAGNOSIS:  Pericardial Effusion  POST-OPERATIVE DIAGNOSIS: same  PROCEDURE:  Procedure(s): REDO SUBXYPHOID PERICARDIAL WINDOW (N/A) TRANSESOPHAGEAL ECHOCARDIOGRAM (TEE) (N/A)  SURGEON:  Surgeon(s) and Role:    * Grace Isaac, MD - Primary  PHYSICIAN ASSISTANT: WAYNE GOLD PA-C  ANESTHESIA:   general  EBL:  20 mL   BLOOD ADMINISTERED:none  DRAINS: (28 f) Blake drain(s) in the PERICARDIAL SPACE   LOCAL MEDICATIONS USED:  NONE  SPECIMEN:  Source of Specimen:  PERICARDIUM AND FLUID  DISPOSITION OF SPECIMEN:  LAB, PATH, MICRO, CYTOL  COUNTS:  YES   DICTATION: .Other Dictation: Dictation Number PENDING  PLAN OF CARE: Admit to inpatient   PATIENT DISPOSITION:  PACU - hemodynamically stable.   Delay start of Pharmacological VTE agent (>24hrs) due to surgical blood loss or risk of bleeding: yes  COMPLICATIONS: NO KNOWN

## 2018-10-01 NOTE — Anesthesia Procedure Notes (Signed)
Arterial Line Insertion Start/End2/12/2018 8:00 AM Performed by: Lowella Dell, CRNA, CRNA  Patient location: Pre-op. Preanesthetic checklist: patient identified, IV checked, site marked, risks and benefits discussed, surgical consent, monitors and equipment checked, pre-op evaluation, timeout performed and anesthesia consent Lidocaine 1% used for infiltration and patient sedated Left, radial was placed Catheter size: 20 G Hand hygiene performed  and maximum sterile barriers used   Attempts: 1 Procedure performed without using ultrasound guided technique. Following insertion, line sutured and dressing applied. Post procedure assessment: normal  Patient tolerated the procedure well with no immediate complications.

## 2018-10-01 NOTE — Anesthesia Procedure Notes (Signed)
Procedure Name: Intubation Date/Time: 10/01/2018 8:55 AM Performed by: Lowella Dell, CRNA Pre-anesthesia Checklist: Patient identified, Emergency Drugs available, Suction available and Patient being monitored Patient Re-evaluated:Patient Re-evaluated prior to induction Oxygen Delivery Method: Circle System Utilized Preoxygenation: Pre-oxygenation with 100% oxygen Induction Type: IV induction and Rapid sequence Laryngoscope Size: Mac and 4 (Gr II with pressure applied; Gr IV without) Grade View: Grade II Tube type: Oral Tube size: 7.5 mm Number of attempts: 1 Airway Equipment and Method: Stylet and Oral airway Placement Confirmation: ETT inserted through vocal cords under direct vision,  positive ETCO2 and breath sounds checked- equal and bilateral Secured at: 1 cm Tube secured with: Tape Dental Injury: Teeth and Oropharynx as per pre-operative assessment  Difficulty Due To: Difficulty was anticipated, Difficult Airway- due to large tongue and Difficult Airway- due to reduced neck mobility Future Recommendations: Recommend- induction with short-acting agent, and alternative techniques readily available

## 2018-10-01 NOTE — Progress Notes (Signed)
Primary RN made aware of blood glucose reading

## 2018-10-01 NOTE — Anesthesia Postprocedure Evaluation (Signed)
Anesthesia Post Note  Patient: Eugene [name]  Procedure(s) Performed: REDO SUBXYPHOID PERICARDIAL WINDOW (N/A Chest) TRANSESOPHAGEAL ECHOCARDIOGRAM (TEE) (N/A )     Patient location during evaluation: PACU Anesthesia Type: General Level of consciousness: awake and alert Pain management: pain level controlled Vital Signs Assessment: post-procedure vital signs reviewed and stable Respiratory status: spontaneous breathing, nonlabored ventilation, respiratory function stable and patient connected to nasal cannula oxygen Cardiovascular status: blood pressure returned to baseline and stable Postop Assessment: no apparent nausea or vomiting Anesthetic complications: no    Last Vitals:  Vitals:   10/01/18 1308 10/01/18 1327  BP: (!) 181/102 (!) 167/95  Pulse: 82 80  Resp: 19 16  Temp: 36.6 C   SpO2: 91% 92%    Last Pain:  Vitals:   10/01/18 1308  TempSrc: Oral  PainSc: 0-No pain                 Ryan P Ellender

## 2018-10-01 NOTE — Anesthesia Procedure Notes (Signed)
Central Venous Catheter Insertion Performed by: Murvin Natal, MD, anesthesiologist Start/End2/12/2018 8:15 AM, 10/01/2018 8:25 AM Patient location: Pre-op. Preanesthetic checklist: patient identified, IV checked, site marked, risks and benefits discussed, surgical consent, monitors and equipment checked, pre-op evaluation, timeout performed and anesthesia consent Position: Trendelenburg Lidocaine 1% used for infiltration and patient sedated Hand hygiene performed , maximum sterile barriers used  and Seldinger technique used Catheter size: 8 Fr Total catheter length 16. Central line was placed.Double lumen Procedure performed using ultrasound guided technique. Ultrasound Notes:anatomy identified, needle tip was noted to be adjacent to the nerve/plexus identified, no ultrasound evidence of intravascular and/or intraneural injection and image(s) printed for medical record Attempts: 1 Following insertion, dressing applied, line sutured and Biopatch. Post procedure assessment: blood return through all ports, free fluid flow and no air  Patient tolerated the procedure well with no immediate complications.

## 2018-10-01 NOTE — Plan of Care (Signed)
  Problem: Education: °Goal: Knowledge of disease or condition will improve °Outcome: Completed/Met °  °

## 2018-10-02 ENCOUNTER — Encounter (HOSPITAL_COMMUNITY): Payer: Self-pay | Admitting: Cardiothoracic Surgery

## 2018-10-02 ENCOUNTER — Inpatient Hospital Stay (HOSPITAL_COMMUNITY): Payer: Medicare HMO

## 2018-10-02 LAB — CBC
HCT: 39.6 % (ref 39.0–52.0)
Hemoglobin: 12.6 g/dL — ABNORMAL LOW (ref 13.0–17.0)
MCH: 29.4 pg (ref 26.0–34.0)
MCHC: 31.8 g/dL (ref 30.0–36.0)
MCV: 92.3 fL (ref 80.0–100.0)
Platelets: 179 10*3/uL (ref 150–400)
RBC: 4.29 MIL/uL (ref 4.22–5.81)
RDW: 13.6 % (ref 11.5–15.5)
WBC: 11.2 10*3/uL — ABNORMAL HIGH (ref 4.0–10.5)
nRBC: 0 % (ref 0.0–0.2)

## 2018-10-02 LAB — GLUCOSE, CAPILLARY
Glucose-Capillary: 207 mg/dL — ABNORMAL HIGH (ref 70–99)
Glucose-Capillary: 211 mg/dL — ABNORMAL HIGH (ref 70–99)
Glucose-Capillary: 233 mg/dL — ABNORMAL HIGH (ref 70–99)
Glucose-Capillary: 240 mg/dL — ABNORMAL HIGH (ref 70–99)
Glucose-Capillary: 282 mg/dL — ABNORMAL HIGH (ref 70–99)
Glucose-Capillary: 343 mg/dL — ABNORMAL HIGH (ref 70–99)

## 2018-10-02 LAB — BASIC METABOLIC PANEL
Anion gap: 12 (ref 5–15)
BUN: 40 mg/dL — AB (ref 8–23)
CALCIUM: 8.5 mg/dL — AB (ref 8.9–10.3)
CO2: 23 mmol/L (ref 22–32)
CREATININE: 1.88 mg/dL — AB (ref 0.61–1.24)
Chloride: 101 mmol/L (ref 98–111)
GFR calc Af Amer: 44 mL/min — ABNORMAL LOW (ref >60)
GFR calc non Af Amer: 38 mL/min — ABNORMAL LOW (ref >60)
Glucose, Bld: 282 mg/dL — ABNORMAL HIGH (ref 70–99)
Potassium: 4.6 mmol/L (ref 3.5–5.1)
Sodium: 136 mmol/L (ref 135–145)

## 2018-10-02 LAB — LD, BODY FLUID (OTHER): LD, Body Fluid: 316 IU/L

## 2018-10-02 LAB — PROTEIN, BODY FLUID (OTHER): Total Protein, Body Fluid Other: 5 g/dL

## 2018-10-02 MED ORDER — TRAMADOL HCL 50 MG PO TABS: 50.0000 mg | ORAL_TABLET | Freq: Four times a day (QID) | ORAL | Status: DC | PRN

## 2018-10-02 MED ORDER — FUROSEMIDE 10 MG/ML IJ SOLN
40.0000 mg | Freq: Once | INTRAMUSCULAR | Status: AC
  Administered 2018-10-02: 40 mg via INTRAVENOUS
  Filled 2018-10-02: qty 4

## 2018-10-02 MED ORDER — ENOXAPARIN SODIUM 30 MG/0.3ML ~~LOC~~ SOLN
30.0000 mg | SUBCUTANEOUS | Status: DC
  Administered 2018-10-02 – 2018-10-04 (×3): 30 mg via SUBCUTANEOUS
  Filled 2018-10-02 (×3): qty 0.3

## 2018-10-02 NOTE — Discharge Summary (Signed)
Physician Discharge Summary  Patient ID: Eugene [name] MRN: [id number] DOB/AGE: [birth year] 63 y.o.  Admit date: 10/01/2018 Discharge date: 10/04/2018  Admission Diagnoses: Chronic pericardial effusion  Discharge Diagnoses:  Active Problems:   S/P pericardial window creation  Patient Active Problem List   Diagnosis Date Noted  . S/P pericardial window creation 10/01/2018  . DM type 2 causing CKD stage 3 ([place]) 11/14/2016  . Acute on chronic systolic congestive heart failure ([place])   . Cardiomyopathy ([place])   . Acute on chronic diastolic CHF (congestive heart failure) ([place]) 11/10/2016  . Elevated troponin   . Coronary artery disease involving native coronary artery of native heart without angina pectoris   . Moderate nonproliferative diabetic retinopathy of both eyes without macular edema associated with type 2 diabetes mellitus ([place]) 10/22/2016  . Hyperkalemia 06/14/2016  . Non-ST elevation myocardial infarction (NSTEMI) ([place])   . Morbid obesity ([place])   . Diabetes mellitus type 2, uncontrolled, with complications ([place]) 76/28/3151  . Right carotid bruit 11/10/2015  . Pain in the chest   . Severe hypertension 11/09/2015  . Hypertensive urgency 11/09/2015  . Gout of wrist 08/08/2015  . Pericardial effusion without cardiac tamponade 05/26/2015  . HLD (hyperlipidemia) 05/26/2015  . Anxiety 05/26/2015  . Olecranon bursitis   . Inflammatory arthritis   . Elbow joint pain 01/21/2015  . SOB (shortness of breath) on exertion 12/26/2014  . Atypical chest pain 12/25/2014  . Constipation 10/26/2014  . Abdominal pain 10/26/2014  . Peripheral edema 01/29/2014  . Acute on chronic diastolic heart failure ([place]) 01/26/2014  . Acute on chronic renal failure ([place]) 01/26/2014  . Acute kidney injury superimposed on chronic kidney disease ([place]) 12/14/2013  . Injury of kidney 12/14/2013  . Chest pain 12/13/2013  . SOB (shortness of breath) 10/16/2013  . Dyspnea 10/15/2013  . Chronic diastolic  CHF (congestive heart failure) ([place]) 10/15/2013  . NSVT (nonsustained ventricular tachycardia) ([place]) 06/26/2013  . Type 2 diabetes mellitus with neurological manifestations ([place]) 06/26/2013  . Acute diastolic CHF (congestive heart failure) ([place]) 06/26/2013  . Pericardial effusion 06/26/2013  . Old myocardial infarction   . Gastroparesis due to DM ([place]) 05/21/2013  . CAD (coronary artery disease) 09/16/2012  . Acute on chronic renal insufficiency 09/14/2012  . Edema 08/18/2012  . Anemia, normocytic normochromic 07/31/2012  . Cholelithiasis 07/27/2012  . GERD (gastroesophageal reflux disease) 05/10/2012  . Noncompliance 05/09/2012  . Peripheral neuropathy 10/24/2011  . Obstructive sleep apnea- on C-pap 08/23/2011  . Hyponatremia 08/23/2011  . CAD- MI/RCA PCI-DES x2 2012, and RCA DES Jan 2014   . Hyperlipidemia   . Cholelithiasis 05/28/2011  . DM type 2 (diabetes mellitus, type 2) ([place]) 05/25/2011  . Essential hypertension 05/25/2011   History of present illness: The patient is a 63 year old male who was referred to Dr. Servando Snare for recurrent pericardial effusion.  The patient had originally presented 6 years ago with a myocardial infarction and subsequent acute pericardial effusion requiring drainage.  The patient has severe diabetes with peripheral neuropathy, advanced stage renal insufficiency but not yet requiring dialysis.  He has been followed with serial echocardiograms and noted over the past year and a half the effusion has been slowly increasing.  A recent CT scan of the chest without contrast showed no evidence of mediastinal mass or lung mass or other findings that may be causing this effusion.  Patient did describe some shortness of breath but has been mostly stable in his symptomatology.  After evaluation by Dr. Servando Snare he felt the  patient would benefit for elective admission for pericardial window for drainage.  Discharged Condition: good  Hospital Course: Patient was admitted  electively and on 10/01/2018 taken to the operating room where he underwent the below described procedure.  He tolerated it well was taken to the postanesthesia care unit in stable condition.  Postoperative hospital course:  Patient is overall progressed nicely.  He has remained hemodynamically stable.  The chest tube was in place till postoperative day #2.  There was not very much drainage.  Postoperative day #1 creatinine was slightly increased from baseline at 1.88.  BUN was measured at 40.  It increased on postoperative #2-50 1/2.33.  It will be repeated prior to discharge.  His ARB was placed on hold pending repeat creatinine.  Creatinine on 02/08 was decreased to 2.04. Postoperative hemoglobin hematocrit were stable at 12.6 and 39.1 respectively.  He has tolerated oxygen wean and maintains good sats on room air.  He has increased his activity commensurate with level of postoperative convalescence.  His incision are healing well without evidence of infection. He has been tolerating a diet. As discussed with Dr. Roxan Hockey,  the patient was felt to be quite stable.  Consults: None  Significant Diagnostic Studies: Routine postoperative labs and serial chest x-rays CLINICAL DATA:  Follow-up pericardial catheter placement  EXAM: PORTABLE CHEST 1 VIEW  COMPARISON:  10/01/2018  FINDINGS: Cardiac shadow is mildly prominent but stable from the prior exam. Pericardial catheter is again noted on the right. Right jugular central line is again seen. The overall inspiratory effort is poor. Improved aeration is noted with decrease in central vascular congestion. No focal infiltrate is seen.  IMPRESSION: Improved vascular congestion.  Tubes and lines as described.   Electronically Signed   By: Inez Catalina M.D.   On: 10/02/2018 09:04  Treatments: surgery:               10/01/2018  10:02 AM  PATIENT:  Eugene [name]  63 y.o. male  PRE-OPERATIVE DIAGNOSIS:  Pericardial  Effusion  POST-OPERATIVE DIAGNOSIS: same  PROCEDURE:  Procedure(s): REDO SUBXYPHOID PERICARDIAL WINDOW (N/A) TRANSESOPHAGEAL ECHOCARDIOGRAM (TEE) (N/A)  SURGEON:  Surgeon(s) and Role:    Grace Isaac, MD - Primary  PHYSICIAN ASSISTANT: WAYNE GOLD PA-C  ANESTHESIA:   general  EBL:  20 mL    Discharge Exam: Blood pressure 121/68, pulse 78, temperature 98 F (36.7 C), temperature source Oral, resp. rate (!) 26, height 6\' 1"  (1.854 m), weight 115.9 kg, SpO2 100 %.   Cardiovascular: RRR Pulmonary: Diminished basilar breath sounds Abdomen: Soft, non tender, bowel sounds present. Extremities: Mild bilateral lower extremity edema. Wounds: Dressings removed and wound is clean and dry.  No erythema or signs of infection. Trace sero sanguinous ooze from chest tube wound.   Disposition: Discharge disposition: 01-Home or Self Care        Allergies as of 10/04/2018      Reactions   Hydrocodone Nausea And Vomiting   Lisinopril Cough   Neurontin [gabapentin] Other (See Comments)   Reaction:  Suicidal thoughts    Statins Other (See Comments)   Reaction:  Muscle pain    Metformin And Related Diarrhea   Norvasc [amlodipine Besylate] Swelling, Other (See Comments)   Reaction:  Pedal edema      Medication List    STOP taking these medications   losartan 25 MG tablet Commonly known as:  COZAAR     TAKE these medications   acetaminophen 500 MG tablet Commonly known  as:  TYLENOL Take 1,500 mg by mouth every 6 (six) hours as needed for mild pain, fever or headache.   allopurinol 100 MG tablet Commonly known as:  ZYLOPRIM Take one tablet by mouth daily What changed:    how much to take  how to take this  when to take this  reasons to take this  additional instructions   ALPRAZolam 0.5 MG tablet Commonly known as:  XANAX TAKE 1/2-1 TABLET BY MOUTH AT BEDTIME AS NEEDED   aspirin 81 MG chewable tablet Chew 81 mg by mouth at bedtime.   fenofibrate  160 MG tablet TAKE 1 TABLET AT BEDTIME   glimepiride 1 MG tablet Commonly known as:  AMARYL TAKE ONE TABLET BY MOUTH TWO TIMES A DAY WITH MEALS What changed:    how much to take  how to take this  when to take this  additional instructions   glucose blood test strip Commonly known as:  ACCU-CHEK SMARTVIEW TEST BLOOD SUGAR ONE TIME DAILY   Insulin Pen Needle 32G X 4 MM Misc Commonly known as:  BD PEN NEEDLE NANO U/F Use once daily as directed   DROPLET PEN NEEDLES 32G X 6 MM Misc Generic drug:  Insulin Pen Needle USE AS DIRECTED   isosorbide mononitrate 60 MG 24 hr tablet Commonly known as:  IMDUR TAKE 1 TABLET (60 MG TOTAL) BY MOUTH DAILY.   LEVEMIR FLEXTOUCH 100 UNIT/ML Pen Generic drug:  Insulin Detemir INJECT 80 UNITS INTO THE SKIN DAILY AT 10 PM. What changed:  how much to take   metoprolol tartrate 25 MG tablet Commonly known as:  LOPRESSOR TAKE 1 TABLET TWICE DAILY   torsemide 20 MG tablet Commonly known as:  DEMADEX TAKE 1 TABLET (20 MG TOTAL) BY MOUTH DAILY. What changed:    how much to take  when to take this  additional instructions   traMADol 50 MG tablet Commonly known as:  ULTRAM Take 1 tablet (50 mg total) by mouth every 6 (six) hours as needed for up to 5 days for severe pain.   VITAMIN D3 PO Take 1 capsule by mouth every other day.      Follow-up Information    Grace Isaac, MD. Go on 10/16/2018.   Specialty:  Cardiothoracic Surgery Why:  PA/LAT CXR to be taken (at [facility] which is in the same building as Dr. Everrett Coombe office) on 02/20 at 10:45 am ;Appointment time is at 11:15 am. Please also sign up for [facility] information: [address] [place] [address] [phone]        Nurse. Go on 10/09/2018.   Why:  Appointment is with nurse only for chest tube removal. Appointment time is at 11:00 am Contact information: [place] [address]           Signed: Nani Skillern PA-C 10/04/2018, 11:18 AM

## 2018-10-02 NOTE — Discharge Instructions (Signed)
Discharge Instructions:  1. You may shower, please wash incisions daily with soap and water and keep dry.  If you wish to cover wounds with dressing you may do so but please keep clean and change daily.  No tub baths or swimming until incisions have completely healed.  If your incisions become red or develop any drainage please call our office at [phone]  2. No Driving until cleared by surgeon  office and you are no longer using narcotic pain medications  3. Monitor your weight daily.. Please use the same scale and weigh at same time... If you gain 3-5 lbs in 48 hours with associated lower extremity swelling, please contact our office at [phone]  4.For fever of 101.5 , please contact our office at [phone]  5. Activity- up as tolerated, please walk at least 3 times per day.  Avoid strenuous activity, no lifting, pushing, or pulling with your arms over 8-10 lbs for a minimum of 6 weeks  6. If any questions or concerns arise, please do not hesitate to contact our office at [phone]

## 2018-10-02 NOTE — Progress Notes (Signed)
Patient ID: Eugene [name], male   DOB: Dec 20, 1955, 63 y.o.   MRN: [id number] TCTS DAILY ICU PROGRESS NOTE                   [address]            [place] [address]          [phone]   1 Day Post-Op Procedure(s) (LRB): REDO SUBXYPHOID PERICARDIAL WINDOW (N/A) TRANSESOPHAGEAL ECHOCARDIOGRAM (TEE) (N/A)  Total Length of Stay:  LOS: 1 day   Subjective: Patient awake alert neurologically intact, says his breathing is better since drainage of his pericardial effusion  Objective: Vital signs in last 24 hours: Temp:  [97.2 F (36.2 C)-98.6 F (37 C)] 98.5 F (36.9 C) (02/06 0340) Pulse Rate:  [54-90] 73 (02/06 0000) Cardiac Rhythm: Normal sinus rhythm;Bundle branch block;Heart block (02/05 1900) Resp:  [10-23] 19 (02/06 0000) BP: (143-181)/(79-102) 143/81 (02/06 0000) SpO2:  [90 %-100 %] 95 % (02/05 2351) Arterial Line BP: (162-191)/(66-78) 162/70 (02/05 1233)  There were no vitals filed for this visit.  Weight change:    Hemodynamic parameters for last 24 hours:    Intake/Output from previous day: 02/05 0701 - 02/06 0700 In: 2306.7 [P.O.:240; I.V.:1866.7] Out: 2094 [Urine:2450; Blood:20; Chest Tube:430]  Intake/Output this shift: No intake/output data recorded.  Current Meds: Scheduled Meds: . acetaminophen  1,000 mg Oral Q6H   Or  . acetaminophen (TYLENOL) oral liquid 160 mg/5 mL  1,000 mg Oral Q6H  . aspirin  81 mg Oral QHS  . bisacodyl  10 mg Oral Daily  . enoxaparin (LOVENOX) injection  40 mg Subcutaneous Daily  . fenofibrate  160 mg Oral QHS  . glimepiride  1 mg Oral QHS  . insulin aspart  0-24 Units Subcutaneous Q6H  . isosorbide mononitrate  60 mg Oral Daily  . losartan  25 mg Oral Daily  . metoprolol tartrate  25 mg Oral BID  . senna-docusate  1 tablet Oral QHS  . torsemide  20 mg Oral Q48H  . [START ON 10/03/2018] torsemide  30 mg Oral Q48H   Continuous Infusions: . sodium chloride 100 mL/hr at 10/01/18 1320  . potassium  chloride     PRN Meds:.fentaNYL (SUBLIMAZE) injection, hydrALAZINE, ondansetron (ZOFRAN) IV, ondansetron (ZOFRAN) IV, oxyCODONE, potassium chloride, traMADol  General appearance: alert, cooperative and no distress Neurologic: intact Heart: regular rate and rhythm, S1, S2 normal, no murmur, click, rub or gallop Lungs: diminished breath sounds bibasilar Abdomen: soft, non-tender; bowel sounds normal; no masses,  no organomegaly Extremities: extremities normal, atraumatic, no cyanosis or edema and Homans sign is negative, no sign of DVT Wound: Dressings intact pericardial tube in place, drainage decreasing  Lab Results: CBC: Recent Labs    09/29/18 1451 10/02/18 0415  WBC 6.2 11.2*  HGB 12.1* 12.6*  HCT 38.3* 39.6  PLT 173 179   BMET:  Recent Labs    09/29/18 1451 10/02/18 0415  NA 135 136  K 4.9 4.6  CL 101 101  CO2 22 23  GLUCOSE 261* 282*  BUN 43* 40*  CREATININE 1.77* 1.88*  CALCIUM 9.4 8.5*    CMET: Lab Results  Component Value Date   WBC 11.2 (H) 10/02/2018   HGB 12.6 (L) 10/02/2018   HCT 39.6 10/02/2018   PLT 179 10/02/2018   GLUCOSE 282 (H) 10/02/2018   CHOL 179 06/16/2018   TRIG 225 (H) 06/16/2018   HDL 34 (L) 06/16/2018   LDLCALC 100 (H) 06/16/2018  ALT 23 09/29/2018   AST 27 09/29/2018   NA 136 10/02/2018   K 4.6 10/02/2018   CL 101 10/02/2018   CREATININE 1.88 (H) 10/02/2018   BUN 40 (H) 10/02/2018   CO2 23 10/02/2018   TSH 1.585 11/02/2016   PSA 0.65 05/12/2014   INR 1.07 09/29/2018   HGBA1C 9.4 (H) 09/29/2018   MICROALBUR 213.69 (H) 05/11/2013      PT/INR:  Recent Labs    09/29/18 1451  LABPROT 13.8  INR 1.07   Radiology: Dg Chest Port 1 View  Result Date: 10/01/2018 CLINICAL DATA:  Status post pericardial window creation. EXAM: PORTABLE CHEST 1 VIEW COMPARISON:  September 29, 2018 FINDINGS: A central line approaching from below terminates in the right side of the atrium, new in the interval. A new right central line approaching from  above terminates in the central SVC. No pneumothorax. The cardiac silhouette is a little smaller in the interval consistent with history. The hila and mediastinum are unchanged. Pulmonary venous congestion/mild edema. No other abnormalities. IMPRESSION: 1. Support apparatus as above.  No pneumothorax. 2. Mild edema/pulmonary venous congestion. 3. The cardiac silhouette is a little smaller in the interval. Electronically Signed   By: Dorise Bullion III M.D   On: 10/01/2018 11:21     Assessment/Plan: S/P Procedure(s) (LRB): REDO SUBXYPHOID PERICARDIAL WINDOW (N/A) TRANSESOPHAGEAL ECHOCARDIOGRAM (TEE) (N/A) Mobilize Diuresis Diabetes control DC central line and Foley today Monitor creatinine, but very close to baseline Leave pericardial tube 1 more day    Grace Isaac 10/02/2018 7:56 AM

## 2018-10-02 NOTE — Progress Notes (Signed)
Inpatient Diabetes Program Recommendations  AACE/ADA: New Consensus Statement on Inpatient Glycemic Control (2015)  Target Ranges:  Prepandial:   less than 140 mg/dL      Peak postprandial:   less than 180 mg/dL (1-2 hours)      Critically ill patients:  140 - 180 mg/dL   Lab Results  Component Value Date   GLUCAP 207 (H) 10/02/2018   HGBA1C 9.4 (H) 09/29/2018    Review of Glycemic Control Results for ELDRIGE, PITKIN (MRN [id number]) as of 10/02/2018 11:01  Ref. Range 10/01/2018 21:16 10/02/2018 00:10 10/02/2018 05:51 10/02/2018 07:55  Glucose-Capillary Latest Ref Range: 70 - 99 mg/dL 264 (H) 240 (H) 233 (H) 207 (H)   Diabetes history: Type 2 DM Outpatient Diabetes medications: Amaryl 1 mg QD, Levemir 70 units QHS Current orders for Inpatient glycemic control: Novolog 0-24 units Q6H, Amaryl 1 mg QHS  Inpatient Diabetes Program Recommendations:    Noted patient received Decadron 4 mg x 1, thus anticipate trends to be increased.   Recommend adding a portion of Levemir back to regimen. Consider Levemir 25 units QHS.   Thanks, Bronson Curb, MSN, RNC-OB Diabetes Coordinator [phone] (8a-5p)

## 2018-10-02 NOTE — Op Note (Signed)
NAME: Eugene [name], Eugene [name]. MEDICAL RECORD [id number] ACCOUNT [id number] DATE OF BIRTH:[birth year] FACILITY: [facility] LOCATION: [facility] PHYSICIAN:Oktober Glazer B. Servando Snare, MD  OPERATIVE REPORT  DATE OF PROCEDURE:  10/01/2018  PREOPERATIVE DIAGNOSIS:  Recurrent pericardial effusion, status post pericardial drainage in 2004.  POSTOPERATIVE DIAGNOSIS:  Recurrent pericardial effusion, status post pericardial drainage in 2004.  SURGICAL PROCEDURE:  Redo subxiphoid pericardial window with drainage of pericardial effusion and pericardial biopsy.  SURGEON:  Lanelle Bal, MD  FIRST ASSISTANT:  Jadene Pierini, [place].  BRIEF HISTORY:  The patient is a 63 year old male with chronic renal insufficiency and diabetes who presented in 2004 with an idiopathic pericardial effusion.  At that time, a subxiphoid pericardial window was performed with drainage of fluid.  No  definitive diagnosis as to the cause was made from the fluid analysis or tissue.  The patient had known renal insufficiency at that time and it was thought it may be related to that.  He had done well until the last year.  He was noted to have increasing  pericardial effusion noted on CT and echocardiograms.  He was referred back by cardiology for drainage of the effusion.  He continued on treatment for diabetes and has chronic renal insufficiency, which has been relatively stable.  Options for drainage  including pericardiocentesis, a left VATS or formal sternotomy with pericardial pericardectomy were discussed.  He elected to proceed with repeat subxiphoid pericardial window especially since it had been such a long interval since his previous drainage.   The patient agreed and signed informed consent.  DESCRIPTION OF PROCEDURE:  The patient underwent general endotracheal anesthesia without incident.  Arterial line and central line were in place.  Dr. Roanna Banning placed a TEE probe and confirmed an overall preserved LV function without serious aortic or   mitral insufficiency or stenosis.  The skin of the chest and legs was prepped with Betadine and draped in a sterile manner.  Appropriate timeout was performed and we proceeded with a subxiphoid pericardial incision through the old incision site down  through the fascia.  With the Rultract in place, we were able to elevate the sternum slightly.  A portion of the xiphoid process was removed and we continued exposing the lower anterior surface of the pericardium.  The pericardium was opened and 750 mL  of straw-colored pericardial fluid was drained.  Portions of this fluid were sent for cytology, cell count, protein, LDH, cultures.  The fluid was free flowing without evidence of loculations.  The pericardium did not appear inflamed.  A portion of the  anterior pericardium was excised and submitted to pathology for examination.  A 28 Blake drain was placed in the inferior recess of the pericardium and brought out through a separate incision just to the right of the incision and secured in place.  TEE  showed complete resolution of the fluid.  The fascia was then closed with interrupted 0 Vicryl, running 3-0 Vicryl, subcutaneous tissue and 3-0 subcuticular stitch in the skin edges.  Dry dressings were applied.  Sponge and needle count was reported as  correct at completion of procedure.  Blood loss was minimal.  RF scanning reported clear code.  The patient was awakened in the operating room and transferred to the recovery room for postoperative observation.  He tolerated the procedure without obvious  complication.  TN/NUANCE  D:10/01/2018 T:10/02/2018 JOB:[id number]

## 2018-10-03 LAB — GLUCOSE, CAPILLARY
Glucose-Capillary: 181 mg/dL — ABNORMAL HIGH (ref 70–99)
Glucose-Capillary: 167 mg/dL — ABNORMAL HIGH (ref 70–99)
Glucose-Capillary: 246 mg/dL — ABNORMAL HIGH (ref 70–99)
Glucose-Capillary: 302 mg/dL — ABNORMAL HIGH (ref 70–99)
Glucose-Capillary: 231 mg/dL — ABNORMAL HIGH (ref 70–99)

## 2018-10-03 LAB — COMPREHENSIVE METABOLIC PANEL
ALK PHOS: 35 U/L — AB (ref 38–126)
ALT: 17 U/L (ref 0–44)
AST: 24 U/L (ref 15–41)
Albumin: 3.1 g/dL — ABNORMAL LOW (ref 3.5–5.0)
Anion gap: 13 (ref 5–15)
BUN: 51 mg/dL — ABNORMAL HIGH (ref 8–23)
CALCIUM: 8.6 mg/dL — AB (ref 8.9–10.3)
CO2: 24 mmol/L (ref 22–32)
Chloride: 99 mmol/L (ref 98–111)
Creatinine, Ser: 2.33 mg/dL — ABNORMAL HIGH (ref 0.61–1.24)
GFR calc Af Amer: 34 mL/min — ABNORMAL LOW (ref >60)
GFR calc non Af Amer: 29 mL/min — ABNORMAL LOW (ref >60)
Glucose, Bld: 207 mg/dL — ABNORMAL HIGH (ref 70–99)
Potassium: 4.1 mmol/L (ref 3.5–5.1)
Sodium: 136 mmol/L (ref 135–145)
Total Bilirubin: 1 mg/dL (ref 0.3–1.2)
Total Protein: 6.5 g/dL (ref 6.5–8.1)

## 2018-10-03 LAB — CBC
HCT: 37 % — ABNORMAL LOW (ref 39.0–52.0)
Hemoglobin: 11.9 g/dL — ABNORMAL LOW (ref 13.0–17.0)
MCH: 29.2 pg (ref 26.0–34.0)
MCHC: 32.2 g/dL (ref 30.0–36.0)
MCV: 90.7 fL (ref 80.0–100.0)
Platelets: 182 10*3/uL (ref 150–400)
RBC: 4.08 MIL/uL — ABNORMAL LOW (ref 4.22–5.81)
RDW: 13.8 % (ref 11.5–15.5)
WBC: 8 10*3/uL (ref 4.0–10.5)
nRBC: 0 % (ref 0.0–0.2)

## 2018-10-03 MED ORDER — INSULIN DETEMIR 100 UNIT/ML ~~LOC~~ SOLN
20.0000 [IU] | Freq: Every day | SUBCUTANEOUS | Status: DC
  Administered 2018-10-03: 20 [IU] via SUBCUTANEOUS
  Filled 2018-10-03: qty 0.2

## 2018-10-03 MED ORDER — INSULIN DETEMIR 100 UNIT/ML FLEXPEN: 20.0000 [IU] | PEN_INJECTOR | Freq: Every day | SUBCUTANEOUS | Status: DC

## 2018-10-03 NOTE — Progress Notes (Addendum)
[address]       [place] [address]             [phone]      2 Days Post-Op Procedure(s) (LRB): REDO SUBXYPHOID PERICARDIAL WINDOW (N/A) TRANSESOPHAGEAL ECHOCARDIOGRAM (TEE) (N/A) Subjective: Feels well, no new c/o  Objective: Vital signs in last 24 hours: Temp:  [97.6 F (36.4 C)-98.9 F (37.2 C)] 98.9 F (37.2 C) (02/07 0801) Pulse Rate:  [66-79] 79 (02/07 0801) Cardiac Rhythm: Normal sinus rhythm (02/07 0801) Resp:  [17-24] 19 (02/07 0801) BP: (113-131)/(66-101) 113/66 (02/07 0801) SpO2:  [95 %-100 %] 96 % (02/07 0801)  Hemodynamic parameters for last 24 hours:    Intake/Output from previous day: 02/06 0701 - 02/07 0700 In: 240 [P.O.:240] Out: 1680 [Urine:1600; Chest Tube:80] Intake/Output this shift: Total I/O In: 0  Out: 400 [Urine:400]  General appearance: alert, cooperative and no distress Heart: regular rate and rhythm and no rub Lungs: clear to auscultation bilaterally Abdomen: soft, non-tender; bowel sounds normal; no masses,  no organomegaly Extremities: + LE edema Wound: dressing CDI  Lab Results: Recent Labs    10/02/18 0415 10/03/18 0220  WBC 11.2* 8.0  HGB 12.6* 11.9*  HCT 39.6 37.0*  PLT 179 182   BMET:  Recent Labs    10/02/18 0415 10/03/18 0220  NA 136 136  K 4.6 4.1  CL 101 99  CO2 23 24  GLUCOSE 282* 207*  BUN 40* 51*  CREATININE 1.88* 2.33*  CALCIUM 8.5* 8.6*    PT/INR: No results for input(s): LABPROT, INR in the last 72 hours. ABG    Component Value Date/Time   PHART 7.396 09/29/2018 1452   HCO3 25.3 09/29/2018 1452   TCO2 27 06/24/2013 0450   ACIDBASEDEF 1.0 06/01/2011 0807   O2SAT 96.4 09/29/2018 1452   CBG (last 3)  Recent Labs    10/02/18 2332 10/03/18 0522 10/03/18 0758  GLUCAP 211* 181* 167*    Meds Scheduled Meds: . acetaminophen  1,000 mg Oral Q6H   Or  . acetaminophen (TYLENOL) oral liquid 160 mg/5 mL  1,000 mg Oral Q6H  . aspirin  81 mg Oral QHS  . bisacodyl  10  mg Oral Daily  . enoxaparin (LOVENOX) injection  30 mg Subcutaneous Q24H  . fenofibrate  160 mg Oral QHS  . glimepiride  1 mg Oral QHS  . insulin aspart  0-24 Units Subcutaneous Q6H  . isosorbide mononitrate  60 mg Oral Daily  . losartan  25 mg Oral Daily  . metoprolol tartrate  25 mg Oral BID  . senna-docusate  1 tablet Oral QHS  . torsemide  20 mg Oral Q48H  . torsemide  30 mg Oral Q48H   Continuous Infusions: . sodium chloride 10 mL/hr at 10/02/18 0858  . potassium chloride     PRN Meds:.fentaNYL (SUBLIMAZE) injection, hydrALAZINE, ondansetron (ZOFRAN) IV, ondansetron (ZOFRAN) IV, potassium chloride, traMADol  Xrays Dg Chest Port 1 View  Result Date: 10/02/2018 CLINICAL DATA:  Follow-up pericardial catheter placement EXAM: PORTABLE CHEST 1 VIEW COMPARISON:  10/01/2018 FINDINGS: Cardiac shadow is mildly prominent but stable from the prior exam. Pericardial catheter is again noted on the right. Right jugular central line is again seen. The overall inspiratory effort is poor. Improved aeration is noted with decrease in central vascular congestion. No focal infiltrate is seen. IMPRESSION: Improved vascular congestion. Tubes and lines as described. Electronically Signed   By: Inez Catalina M.D.   On: 10/02/2018 09:04  Dg Chest Port 1 View  Result Date: 10/01/2018 CLINICAL DATA:  Status post pericardial window creation. EXAM: PORTABLE CHEST 1 VIEW COMPARISON:  September 29, 2018 FINDINGS: A central line approaching from below terminates in the right side of the atrium, new in the interval. A new right central line approaching from above terminates in the central SVC. No pneumothorax. The cardiac silhouette is a little smaller in the interval consistent with history. The hila and mediastinum are unchanged. Pulmonary venous congestion/mild edema. No other abnormalities. IMPRESSION: 1. Support apparatus as above.  No pneumothorax. 2. Mild edema/pulmonary venous congestion. 3. The cardiac silhouette is  a little smaller in the interval. Electronically Signed   By: Dorise Bullion III M.D   On: 10/01/2018 11:21    Assessment/Plan: S/P Procedure(s) (LRB): REDO SUBXYPHOID PERICARDIAL WINDOW (N/A) TRANSESOPHAGEAL ECHOCARDIOGRAM (TEE) (N/A)  1 doing well hemodyn stable 2 120 cc from tube yesterday, 40 so far today- leave for now 3 creat rising- d/c ARB for now, gets torsemide 4 diabetes - fair control, poor at home witj A1C 9.4!   LOS: 2 days    Eugene Giovanni PA-C 10/03/2018 Pager [phone]  baseline renal insufficiency, cr 2.3 now basleline 1.8, hold arb D/c mt this afternoon, poss home tomorrow if renal function stable Path negative , on pericardium , cytology pending  I have seen and examined Eugene [name] and agree with the above assessment  and plan.  Grace Isaac MD Beeper [phone] Office [phone] 10/03/2018 12:58 PM

## 2018-10-03 NOTE — Care Management Important Message (Signed)
Important Message  Patient Details  Name: Eugene [name] MRN: [id number] Date of Birth: Jun 16, 1956   Medicare Important Message Given:  Yes    Mckinsley Koelzer P [place] 10/03/2018, 1:21 PM

## 2018-10-03 NOTE — Progress Notes (Signed)
Inpatient Diabetes Program Recommendations  AACE/ADA: New Consensus Statement on Inpatient Glycemic Control (2015)  Target Ranges:  Prepandial:   less than 140 mg/dL      Peak postprandial:   less than 180 mg/dL (1-2 hours)      Critically ill patients:  140 - 180 mg/dL   Lab Results  Component Value Date   GLUCAP 167 (H) 10/03/2018   HGBA1C 9.4 (H) 09/29/2018    Review of Glycemic Control Results for Eugene [name], Eugene [name] (MRN [id number]) as of 10/03/2018 11:36  Ref. Range 10/02/2018 11:59 10/02/2018 16:52 10/02/2018 23:32 10/03/2018 05:22 10/03/2018 07:58  Glucose-Capillary Latest Ref Range: 70 - 99 mg/dL 282 (H) 343 (H) 211 (H) 181 (H) 167 (H)   Diabetes history: Type 2 DM Outpatient Diabetes medications: Amaryl 1 mg QD, Levemir 70 units QHS Current orders for Inpatient glycemic control: Novolog 0-24 units Q6H, Amaryl 1 mg QHS  Inpatient Diabetes Program Recommendations:    Noted patient received Decadron 4 mg x 1, thus anticipate trends to be increased.   Recommend adding a portion of Levemir back to regimen. Consider Levemir 20 units QHS.   Thanks, Bronson Curb, MSN, RNC-OB Diabetes Coordinator [phone] (8a-5p)

## 2018-10-04 LAB — BODY FLUID CULTURE
Culture: NO GROWTH
Gram Stain: NONE SEEN

## 2018-10-04 LAB — BASIC METABOLIC PANEL
Anion gap: 13 (ref 5–15)
BUN: 50 mg/dL — ABNORMAL HIGH (ref 8–23)
CO2: 27 mmol/L (ref 22–32)
Calcium: 9.2 mg/dL (ref 8.9–10.3)
Chloride: 98 mmol/L (ref 98–111)
Creatinine, Ser: 2.04 mg/dL — ABNORMAL HIGH (ref 0.61–1.24)
GFR calc non Af Amer: 34 mL/min — ABNORMAL LOW (ref >60)
GFR, EST AFRICAN AMERICAN: 40 mL/min — AB (ref >60)
Glucose, Bld: 143 mg/dL — ABNORMAL HIGH (ref 70–99)
Potassium: 4.2 mmol/L (ref 3.5–5.1)
Sodium: 138 mmol/L (ref 135–145)

## 2018-10-04 LAB — GLUCOSE, CAPILLARY: Glucose-Capillary: 112 mg/dL — ABNORMAL HIGH (ref 70–99)

## 2018-10-04 LAB — CBC
HCT: 39.1 % (ref 39.0–52.0)
Hemoglobin: 12.6 g/dL — ABNORMAL LOW (ref 13.0–17.0)
MCH: 29 pg (ref 26.0–34.0)
MCHC: 32.2 g/dL (ref 30.0–36.0)
MCV: 89.9 fL (ref 80.0–100.0)
Platelets: 204 10*3/uL (ref 150–400)
RBC: 4.35 MIL/uL (ref 4.22–5.81)
RDW: 13.4 % (ref 11.5–15.5)
WBC: 7.2 10*3/uL (ref 4.0–10.5)
nRBC: 0 % (ref 0.0–0.2)

## 2018-10-04 MED ORDER — TRAMADOL HCL 50 MG PO TABS: 50.0000 mg | ORAL_TABLET | Freq: Four times a day (QID) | ORAL | 0 refills | Status: AC | PRN

## 2018-10-04 NOTE — Progress Notes (Addendum)
      [address]       [place] [address]             [phone]        3 Days Post-Op Procedure(s) (LRB): REDO SUBXYPHOID PERICARDIAL WINDOW (N/A) TRANSESOPHAGEAL ECHOCARDIOGRAM (TEE) (N/A)  Subjective: Patient without complaints this am. He hopes to go home.  Objective: Vital signs in last 24 hours: Temp:  [97.9 F (36.6 C)-98.3 F (36.8 C)] 98 F (36.7 C) (02/08 0824) Pulse Rate:  [73-84] 78 (02/08 0824) Cardiac Rhythm: Normal sinus rhythm;Bundle branch block (02/08 0800) Resp:  [16-26] 26 (02/08 0824) BP: (121-147)/(68-83) 121/68 (02/08 0824) SpO2:  [91 %-100 %] 100 % (02/08 0824) Weight:  [115.9 kg] 115.9 kg (02/08 6812)   Current Weight  10/04/18 115.9 kg      Intake/Output from previous day: 02/07 0701 - 02/08 0700 In: 720 [P.O.:720] Out: 1281 [Urine:1250; Stool:1; Chest Tube:30]   Physical Exam:  Cardiovascular: RRR Pulmonary: Diminished basilar breath sounds Abdomen: Soft, non tender, bowel sounds present. Extremities: Mild bilateral lower extremity edema. Wounds: Dressings removed and wound is clean and dry.  No erythema or signs of infection. Trace sero sanguinous ooze from chest tube wound.  Lab Results: CBC: Recent Labs    10/03/18 0220 10/04/18 0318  WBC 8.0 7.2  HGB 11.9* 12.6*  HCT 37.0* 39.1  PLT 182 204   BMET:  Recent Labs    10/03/18 0220 10/04/18 0318  NA 136 138  K 4.1 4.2  CL 99 98  CO2 24 27  GLUCOSE 207* 143*  BUN 51* 50*  CREATININE 2.33* 2.04*  CALCIUM 8.6* 9.2    PT/INR:  Lab Results  Component Value Date   INR 1.07 09/29/2018   INR 1.00 06/14/2016   INR 1.00 06/22/2013   ABG:  INR: Will add last result for INR, ABG once components are confirmed Will add last 4 CBG results once components are confirmed  Assessment/Plan:  1. CV - SR, first degree heart block. On Lopressor 25 mg bid and Imdur 60 mg daily. Will defer when to restart Losartan to primary care physician 2.  Pulmonary -  On room air. Encourage incentive spirometer 3. Creatinine decreased from 2.33 to 2.04 (was 1.88 PTA) 4. DM-CBGs 302/231/112. On Glimeperide 1 mg at hs. 5. On Torsemide 20mg  alternating with 30 mg as taken PTA  6. As discussed with Dr. Roxan Hockey, discharge.  Sharalyn Ink ZimmermanPA-C 10/04/2018,9:25 AM [phone]  Patient seen and examined, agree with above Dc home today  Revonda Standard. Roxan Hockey, MD [facility] [phone]

## 2018-10-06 NOTE — Consult Note (Signed)
            [facility] CM Primary Care Navigator  10/06/2018  [place] 12-08-1955 [id number]   Attempt to seepatient at the bedside to identify possible discharge needs buthe wasalready dischargedhomeover the weekend.  Per MD note,patient had been followed by cardiologist (Dr. Servando Snare) for recurrent pericardial effusion, was admitted electively and on 10/01/2018 and underwent pericardial window creation for drainage.  Patient has discharge instruction to follow-up withcardiothoracic surgery on 10/16/2018.   Primary care provider's office is listed as providing transition of care (TOC) follow-up.   For additional questions please contact:  Edwena Felty A. Dietra Stokely, BSN, RN-BC [facility] PRIMARY CARE Navigator Cell: [phone]

## 2018-10-09 ENCOUNTER — Encounter (INDEPENDENT_AMBULATORY_CARE_PROVIDER_SITE_OTHER): Payer: Self-pay

## 2018-10-09 DIAGNOSIS — Z4802 Encounter for removal of sutures: Secondary | ICD-10-CM

## 2018-10-14 ENCOUNTER — Other Ambulatory Visit: Payer: Self-pay | Admitting: *Deleted

## 2018-10-14 DIAGNOSIS — Z9889 Other specified postprocedural states: Secondary | ICD-10-CM

## 2018-10-16 ENCOUNTER — Ambulatory Visit (INDEPENDENT_AMBULATORY_CARE_PROVIDER_SITE_OTHER): Payer: Self-pay | Admitting: Cardiothoracic Surgery

## 2018-10-16 ENCOUNTER — Encounter: Payer: Self-pay | Admitting: Cardiothoracic Surgery

## 2018-10-16 ENCOUNTER — Ambulatory Visit
Admission: RE | Admit: 2018-10-16 | Discharge: 2018-10-16 | Disposition: A | Discharge status: Home / Self Care | Payer: Medicare HMO | Source: Ambulatory Visit | Attending: Cardiothoracic Surgery | Admitting: Cardiothoracic Surgery

## 2018-10-16 ENCOUNTER — Other Ambulatory Visit: Payer: Self-pay

## 2018-10-16 VITALS — BP 120/71 | HR 65 | Ht 73.0 in | Wt 243.0 lb

## 2018-10-16 DIAGNOSIS — I517 Cardiomegaly: Secondary | ICD-10-CM | POA: Diagnosis not present

## 2018-10-16 DIAGNOSIS — I3139 Other pericardial effusion (noninflammatory): Secondary | ICD-10-CM

## 2018-10-16 DIAGNOSIS — Z9889 Other specified postprocedural states: Secondary | ICD-10-CM

## 2018-10-16 DIAGNOSIS — I313 Pericardial effusion (noninflammatory): Secondary | ICD-10-CM

## 2018-10-16 NOTE — Progress Notes (Signed)
[address]       ,[place] [address]             [phone]      Eugene [name] [place] Medical Record #[id number] Date of Birth: 1956-05-12  Referring: Herminio Commons, MD Primary Care: [name], MD Primary Cardiologist: Kate Sable, MD   Chief Complaint:   POST OP FOLLOW UP 10/01/2018 PREOPERATIVE DIAGNOSIS:  Recurrent pericardial effusion, status post pericardial drainage in 2004. POSTOPERATIVE DIAGNOSIS:  Recurrent pericardial effusion, status post pericardial drainage in 2004. SURGICAL PROCEDURE:  Redo subxiphoid pericardial window with drainage of pericardial effusion and pericardial biopsy. SURGEON:  Lanelle Bal, MD  History of Present Illness:     Patient returns to the office today after recent subxiphoid drainage of pericardial effusion, approximately 750 mL of straw-colored fluid was removed without evidence of pericardial inflammation, negative cultures and never negative cytology.  Since discharge home the patient has noted a significant improvement in his overall respiratory status and physical activity level.  He notes that he is getting around and exercising better than he had been.      Past Medical History:  Diagnosis Date  . Anemia   . Anxiety   . Arteriosclerotic cardiovascular disease (ASCVD)    a. 05/2011 s/p DES to PDA and RCA. b. 08/2012 Inflat  STEMI/Cath/PCI: LM minor irregs, LAD 50p, D1 50, LCX nl, OM1 25, RCA 30-40p, 100d (treated with 2.75x81mm Promus Premier DES);    [name] Asthma   . Bell palsy   . C. difficile colitis    a. 08/2012  . Cholelithiasis 07/2012   Asymptomatic; identified incidentally  . Chronic diastolic CHF (congestive heart failure) ([place])   . Contrast dye induced nephropathy    a. 08/2012 post cath/pci  . COPD (chronic obstructive pulmonary disease) ([place])   . Diabetes mellitus    Peripheral neuropathy  . Gallstones   . GERD (gastroesophageal reflux disease)   . Hyperlipidemia    . Hypertension   . Myocardial infarct ([place]) 09/08/12  . Nephrolithiasis   . Old myocardial infarction   . Pericardial effusion    a. s/p window in 2014.  [name] PONV (postoperative nausea and vomiting)   . Sleep apnea      Social History   Tobacco Use  Smoking Status Never Smoker  Smokeless Tobacco Never Used    Social History   Substance and Sexual Activity  Alcohol Use No  . Alcohol/week: 0.0 standard drinks   Comment: heavy etoh use 30 years ago     Allergies  Allergen Reactions  . Hydrocodone Nausea And Vomiting  . Lisinopril Cough  . Neurontin [Gabapentin] Other (See Comments)    Reaction:  Suicidal thoughts   . Statins Other (See Comments)    Reaction:  Muscle pain   . Metformin And Related Diarrhea  . Norvasc [Amlodipine Besylate] Swelling and Other (See Comments)    Reaction:  Pedal edema    Current Outpatient Medications  Medication Sig Dispense Refill  . acetaminophen (TYLENOL) 500 MG tablet Take 1,500 mg by mouth every 6 (six) hours as needed for mild pain, fever or headache.     . allopurinol (ZYLOPRIM) 100 MG tablet Take one tablet by mouth daily (Patient taking differently: Take 100 mg by mouth daily as needed (for gout flare up). ) 90 tablet 1  . ALPRAZolam (XANAX) 0.5 MG tablet TAKE 1/2-1 TABLET BY MOUTH AT BEDTIME AS NEEDED 30 tablet 5  . aspirin  81 MG chewable tablet Chew 81 mg by mouth at bedtime.    . Cholecalciferol (VITAMIN D3 PO) Take 1 capsule by mouth every other day.    . DROPLET PEN NEEDLES 32G X 6 MM MISC USE AS DIRECTED 300 each 0  . fenofibrate 160 MG tablet TAKE 1 TABLET AT BEDTIME (Patient taking differently: Take 160 mg by mouth at bedtime. ) 90 tablet 0  . glimepiride (AMARYL) 1 MG tablet TAKE ONE TABLET BY MOUTH TWO TIMES A DAY WITH MEALS (Patient taking differently: Take 1 mg by mouth at bedtime. ) 60 tablet 5  . glucose blood (ACCU-CHEK SMARTVIEW) test strip TEST BLOOD SUGAR ONE TIME DAILY 50 each 5  . Insulin Pen Needle (BD PEN  NEEDLE NANO U/F) 32G X 4 MM MISC Use once daily as directed 100 each 0  . isosorbide mononitrate (IMDUR) 60 MG 24 hr tablet TAKE 1 TABLET (60 MG TOTAL) BY MOUTH DAILY. 90 tablet 3  . LEVEMIR FLEXTOUCH 100 UNIT/ML Pen INJECT 80 UNITS INTO THE SKIN DAILY AT 10 PM. (Patient taking differently: Inject 70 Units into the skin daily at 10 pm. ) 30 mL 3  . metoprolol tartrate (LOPRESSOR) 25 MG tablet TAKE 1 TABLET TWICE DAILY (Patient taking differently: Take 25 mg by mouth 2 (two) times daily. ) 180 tablet 0  . torsemide (DEMADEX) 20 MG tablet TAKE 1 TABLET (20 MG TOTAL) BY MOUTH DAILY. (Patient taking differently: Take 20-30 mg by mouth See admin instructions. Take 20 mg by mouth every other day alternating with 30 mg by mouth every other day) 90 tablet 0   No current facility-administered medications for this visit.        Physical Exam: BP 120/71 (BP Location: Left Arm, Patient Position: Sitting, Cuff Size: Large)   Pulse 65   Ht 6\' 1"  (1.854 m)   Wt 243 lb (110.2 kg)   SpO2 96% Comment: RA  BMI 32.06 kg/m   General appearance: alert, cooperative and no distress Neurologic: intact Heart: regular rate and rhythm, S1, S2 normal, no murmur, click, rub or gallop Lungs: clear to auscultation bilaterally Abdomen: soft, non-tender; bowel sounds normal; no masses,  no organomegaly Extremities: extremities normal, atraumatic, no cyanosis or edema and Homans sign is negative, no sign of DVT Wound: Subxiphoid incisions healing well without evidence of infection   Diagnostic Studies & Laboratory data:     Recent Radiology Findings:   Dg Chest 2 View  Result Date: 10/16/2018 CLINICAL DATA:  Status post pericardial window creation on 09/29/2018. EXAM: CHEST - 2 VIEW COMPARISON:  10/02/2018. FINDINGS: Stable enlarged cardiac silhouette. Clear lungs with normal vascularity. The right jugular catheter has been removed. Thoracic spine degenerative changes. IMPRESSION: No acute abnormality. Stable  cardiomegaly. Electronically Signed   By: Claudie Revering M.D.   On: 10/16/2018 10:42    I have independently reviewed the above radiology studies  and reviewed the findings with the patient. Overall size of cardiac silhouette has decreased on chest x-ray   Recent Lab Findings: Lab Results  Component Value Date   WBC 7.2 10/04/2018   HGB 12.6 (L) 10/04/2018   HCT 39.1 10/04/2018   PLT 204 10/04/2018   GLUCOSE 143 (H) 10/04/2018   CHOL 179 06/16/2018   TRIG 225 (H) 06/16/2018   HDL 34 (L) 06/16/2018   LDLCALC 100 (H) 06/16/2018   ALT 17 10/03/2018   AST 24 10/03/2018   NA 138 10/04/2018   K 4.2 10/04/2018   CL 98 10/04/2018  CREATININE 2.04 (H) 10/04/2018   BUN 50 (H) 10/04/2018   CO2 27 10/04/2018   TSH 1.585 11/02/2016   INR 1.07 09/29/2018   HGBA1C 9.4 (H) 09/29/2018      Assessment / Plan:      Patient symptomatically improved after drainage of idiopathic pericardial fluid, no evidence of infection or malignancy on cytology or CT scan.  He will continue to gradually increase his activity as tolerated Continue to be followed by cardiology to be seen in April, will leave it up to cardiology to follow-up with echocardiogram.  Return to surgical clinic as necessary    Grace Isaac MD      [address] [place] [address] Office [phone]   Beeper [phone]  10/16/2018 12:38 PM

## 2018-10-21 ENCOUNTER — Encounter: Payer: Self-pay | Admitting: Sports Medicine

## 2018-10-21 ENCOUNTER — Ambulatory Visit: Payer: Medicare HMO | Admitting: Sports Medicine

## 2018-10-21 DIAGNOSIS — L97521 Non-pressure chronic ulcer of other part of left foot limited to breakdown of skin: Secondary | ICD-10-CM | POA: Diagnosis not present

## 2018-10-21 DIAGNOSIS — E1142 Type 2 diabetes mellitus with diabetic polyneuropathy: Secondary | ICD-10-CM

## 2018-10-21 NOTE — Patient Instructions (Signed)
[place] Healthy feet for dry skin

## 2018-10-21 NOTE — Progress Notes (Signed)
Subjective: Eugene [name] is a 63 y.o. male patient seen in office for evaluation of ulceration of the plantar left foot. Patient has a history of diabetes and a blood glucose level today of 146 mg/dl.   Patient admits his wife noticed bleeding on left foot on yesterday so decided to have his foot check. Denies nausea/fever/vomiting/chills/night sweats/shortness of breath/pain. Patient has no other pedal complaints at this time.  Patient Active Problem List   Diagnosis Date Noted  . S/P pericardial window creation 10/01/2018  . DM type 2 causing CKD stage 3 ([place]) 11/14/2016  . Acute on chronic systolic congestive heart failure ([place])   . Cardiomyopathy ([place])   . Acute on chronic diastolic CHF (congestive heart failure) ([place]) 11/10/2016  . Elevated troponin   . Coronary artery disease involving native coronary artery of native heart without angina pectoris   . Moderate nonproliferative diabetic retinopathy of both eyes without macular edema associated with type 2 diabetes mellitus ([place]) 10/22/2016  . Hyperkalemia 06/14/2016  . Non-ST elevation myocardial infarction (NSTEMI) ([place])   . Morbid obesity ([place])   . Diabetes mellitus type 2, uncontrolled, with complications ([place]) 17/00/1749  . Right carotid bruit 11/10/2015  . Pain in the chest   . Severe hypertension 11/09/2015  . Hypertensive urgency 11/09/2015  . Gout of wrist 08/08/2015  . Pericardial effusion without cardiac tamponade 05/26/2015  . HLD (hyperlipidemia) 05/26/2015  . Anxiety 05/26/2015  . Olecranon bursitis   . Inflammatory arthritis   . Elbow joint pain 01/21/2015  . SOB (shortness of breath) on exertion 12/26/2014  . Atypical chest pain 12/25/2014  . Constipation 10/26/2014  . Abdominal pain 10/26/2014  . Peripheral edema 01/29/2014  . Acute on chronic diastolic heart failure ([place]) 01/26/2014  . Acute on chronic renal failure ([place]) 01/26/2014  . Acute kidney injury superimposed on chronic kidney disease ([place])  12/14/2013  . Injury of kidney 12/14/2013  . Chest pain 12/13/2013  . SOB (shortness of breath) 10/16/2013  . Dyspnea 10/15/2013  . Chronic diastolic CHF (congestive heart failure) ([place]) 10/15/2013  . NSVT (nonsustained ventricular tachycardia) ([place]) 06/26/2013  . Type 2 diabetes mellitus with neurological manifestations ([place]) 06/26/2013  . Acute diastolic CHF (congestive heart failure) ([place]) 06/26/2013  . Pericardial effusion 06/26/2013  . Old myocardial infarction   . Gastroparesis due to DM ([place]) 05/21/2013  . CAD (coronary artery disease) 09/16/2012  . Acute on chronic renal insufficiency 09/14/2012  . Edema 08/18/2012  . Anemia, normocytic normochromic 07/31/2012  . Cholelithiasis 07/27/2012  . GERD (gastroesophageal reflux disease) 05/10/2012  . Noncompliance 05/09/2012  . Peripheral neuropathy 10/24/2011  . Obstructive sleep apnea- on C-pap 08/23/2011  . Hyponatremia 08/23/2011  . CAD- MI/RCA PCI-DES x2 2012, and RCA DES Jan 2014   . Hyperlipidemia   . Cholelithiasis 05/28/2011  . DM type 2 (diabetes mellitus, type 2) ([place]) 05/25/2011  . Essential hypertension 05/25/2011   Current Outpatient Medications on File Prior to Visit  Medication Sig Dispense Refill  . acetaminophen (TYLENOL) 500 MG tablet Take 1,500 mg by mouth every 6 (six) hours as needed for mild pain, fever or headache.     . allopurinol (ZYLOPRIM) 100 MG tablet Take one tablet by mouth daily (Patient taking differently: Take 100 mg by mouth daily as needed (for gout flare up). ) 90 tablet 1  . ALPRAZolam (XANAX) 0.5 MG tablet TAKE 1/2-1 TABLET BY MOUTH AT BEDTIME AS NEEDED 30 tablet 5  . aspirin 81 MG chewable tablet Chew 81 mg by mouth  at bedtime.    . Cholecalciferol (VITAMIN D3 PO) Take 1 capsule by mouth every other day.    . DROPLET PEN NEEDLES 32G X 6 MM MISC USE AS DIRECTED 300 each 0  . fenofibrate 160 MG tablet TAKE 1 TABLET AT BEDTIME (Patient taking differently: Take 160 mg by mouth at bedtime. ) 90  tablet 0  . glimepiride (AMARYL) 1 MG tablet TAKE ONE TABLET BY MOUTH TWO TIMES A DAY WITH MEALS (Patient taking differently: Take 1 mg by mouth at bedtime. ) 60 tablet 5  . glucose blood (ACCU-CHEK SMARTVIEW) test strip TEST BLOOD SUGAR ONE TIME DAILY 50 each 5  . Insulin Pen Needle (BD PEN NEEDLE NANO U/F) 32G X 4 MM MISC Use once daily as directed 100 each 0  . isosorbide mononitrate (IMDUR) 60 MG 24 hr tablet TAKE 1 TABLET (60 MG TOTAL) BY MOUTH DAILY. 90 tablet 3  . LEVEMIR FLEXTOUCH 100 UNIT/ML Pen INJECT 80 UNITS INTO THE SKIN DAILY AT 10 PM. (Patient taking differently: Inject 70 Units into the skin daily at 10 pm. ) 30 mL 3  . metoprolol tartrate (LOPRESSOR) 25 MG tablet TAKE 1 TABLET TWICE DAILY (Patient taking differently: Take 25 mg by mouth 2 (two) times daily. ) 180 tablet 0  . torsemide (DEMADEX) 20 MG tablet TAKE 1 TABLET (20 MG TOTAL) BY MOUTH DAILY. (Patient taking differently: Take 20-30 mg by mouth See admin instructions. Take 20 mg by mouth every other day alternating with 30 mg by mouth every other day) 90 tablet 0   No current facility-administered medications on file prior to visit.    Allergies  Allergen Reactions  . Hydrocodone Nausea And Vomiting  . Lisinopril Cough  . Neurontin [Gabapentin] Other (See Comments)    Reaction:  Suicidal thoughts   . Statins Other (See Comments)    Reaction:  Muscle pain   . Metformin And Related Diarrhea  . Norvasc [Amlodipine Besylate] Swelling and Other (See Comments)    Reaction:  Pedal edema     Objective: There were no vitals filed for this visit.  General: Patient is awake, alert, oriented x 3 and in no acute distress.  Dermatology: Skin is warm and dry bilateral with a partial thickness ulceration present  Plantar left forefoot. Ulceration measures 0.2cm x 0.2 cm x 0.1 cm. There is a  keratotic border with a granular base. The ulceration does not  probe to bone. There is no malodor, no active drainage, no erythema, no  edema. No acute signs of infection.   Vascular: Dorsalis Pedis pulse = 1/4 Bilateral,  Posterior Tibial pulse = 1/4 Bilateral,  Capillary Fill Time < 5 seconds  Neurologic: Protective sensation absent to the level of ankles using  the 5.07/10g [employer].  Musculosketal: No Pain with palpation to ulcerated area on left. No pain with compression to calves bilateral. No gross bony deformities noted bilateral.    Recent Labs    10/01/18 0931  GRAMSTAIN NO WBC SEEN NO ORGANISMS SEEN     Assessment and Plan:  Problem List Items Addressed This Visit    None    Visit Diagnoses    Foot ulcer, left, limited to breakdown of skin ([place])    -  Primary   Diabetic polyneuropathy associated with type 2 diabetes mellitus ([place])           -Examined patient and discussed the progression of the wound and treatment alternatives. - Excisionally dedbrided ulceration at Left plantar forefoot to  healthy bleeding borders removing nonviable tissue using a sterile chisel blade. Wound measures post debridement as above. Wound was debrided to the level of the dermis with viable wound base exposed to promote healing. Hemostasis was achieved with manuel pressure. Patient tolerated procedure well without any discomfort or anesthesia necessary for this wound debridement.  -Wound culture obtained will call patient if need oral Antibiotics however at this time there are no acute signs of infection  -Applied offloading padding in shoe and antibiotic cream and dry sterile dressing and instructed patient to continue with daily dressings at home consisting of same - Advised patient to go to the ER or return to office if the wound worsens or if constitutional symptoms are present. -Patient to return to office in 2 weeks or next appt for follow up care and evaluation or sooner if problems arise.  Landis Martins, DPM

## 2018-10-25 LAB — WOUND CULTURE
MICRO NUMBER:: 239753
SPECIMEN QUALITY:: ADEQUATE

## 2018-10-27 ENCOUNTER — Telehealth: Payer: Self-pay | Admitting: *Deleted

## 2018-10-27 MED ORDER — AMOXICILLIN-POT CLAVULANATE 875-125 MG PO TABS: 1.0000 | ORAL_TABLET | Freq: Two times a day (BID) | ORAL | 0 refills | Status: DC

## 2018-10-27 NOTE — Telephone Encounter (Signed)
-----   Message from [place], [place] sent at 10/27/2018  8:43 AM EST ----- Wound culture + start Augmentin 875 bid x 14 days Thanks Dr Chauncey Cruel

## 2018-10-27 NOTE — Telephone Encounter (Signed)
I informed pt of Dr. Leeanne Rio review of results and orders. Pt states understanding.

## 2018-11-07 ENCOUNTER — Other Ambulatory Visit (HOSPITAL_COMMUNITY): Payer: Self-pay | Admitting: Physician Assistant

## 2018-11-08 ENCOUNTER — Other Ambulatory Visit: Payer: Self-pay | Admitting: Cardiovascular Disease

## 2018-11-11 ENCOUNTER — Other Ambulatory Visit: Payer: Self-pay | Admitting: Family Medicine

## 2018-11-11 NOTE — Telephone Encounter (Signed)
This +3 additional refills

## 2018-11-14 ENCOUNTER — Ambulatory Visit: Payer: Medicare HMO | Admitting: Podiatry

## 2018-11-19 ENCOUNTER — Encounter (INDEPENDENT_AMBULATORY_CARE_PROVIDER_SITE_OTHER): Payer: Medicare HMO | Admitting: Ophthalmology

## 2018-11-19 ENCOUNTER — Other Ambulatory Visit: Payer: Self-pay

## 2018-11-19 DIAGNOSIS — E11311 Type 2 diabetes mellitus with unspecified diabetic retinopathy with macular edema: Secondary | ICD-10-CM

## 2018-11-19 DIAGNOSIS — I10 Essential (primary) hypertension: Secondary | ICD-10-CM

## 2018-11-19 DIAGNOSIS — E113311 Type 2 diabetes mellitus with moderate nonproliferative diabetic retinopathy with macular edema, right eye: Secondary | ICD-10-CM

## 2018-11-19 DIAGNOSIS — H35033 Hypertensive retinopathy, bilateral: Secondary | ICD-10-CM

## 2018-11-19 DIAGNOSIS — D3132 Benign neoplasm of left choroid: Secondary | ICD-10-CM

## 2018-11-19 DIAGNOSIS — H43813 Vitreous degeneration, bilateral: Secondary | ICD-10-CM

## 2018-11-19 DIAGNOSIS — E113512 Type 2 diabetes mellitus with proliferative diabetic retinopathy with macular edema, left eye: Secondary | ICD-10-CM | POA: Diagnosis not present

## 2018-12-08 ENCOUNTER — Ambulatory Visit: Payer: Medicare HMO | Admitting: Cardiovascular Disease

## 2018-12-12 ENCOUNTER — Telehealth: Payer: Self-pay | Admitting: Family Medicine

## 2018-12-12 ENCOUNTER — Ambulatory Visit (INDEPENDENT_AMBULATORY_CARE_PROVIDER_SITE_OTHER): Payer: Medicare HMO | Admitting: Family Medicine

## 2018-12-12 ENCOUNTER — Other Ambulatory Visit: Payer: Self-pay | Admitting: *Deleted

## 2018-12-12 ENCOUNTER — Other Ambulatory Visit: Payer: Self-pay

## 2018-12-12 DIAGNOSIS — R197 Diarrhea, unspecified: Secondary | ICD-10-CM

## 2018-12-12 MED ORDER — METRONIDAZOLE 500 MG PO TABS: 500.0000 mg | ORAL_TABLET | Freq: Three times a day (TID) | ORAL | 0 refills | Status: DC

## 2018-12-12 NOTE — Telephone Encounter (Signed)
Patient states has had diarrhea for 3 days and wanting something called into [facility]

## 2018-12-12 NOTE — Telephone Encounter (Signed)
Virtual visit scheduled for today. 

## 2018-12-12 NOTE — Telephone Encounter (Signed)
Started after eating take out 3 days ago. Stool is yellow and does look like some mucus in it. No blood, no fever, no abdominal pain, no vomiting. Having a lot of gas and has diarrhea more than 10 times per day. States he is starting to feel a little weak and wife is getting him some pedialyte. Has not tried immodium or anything for diarrhea. Wanted to know what dr Nicki Reaper would recommend before getting anything.

## 2018-12-12 NOTE — Progress Notes (Signed)
   Subjective:    Patient ID: Eugene [name], male    DOB: 07/09/1956, 63 y.o.   MRN: [id number] Telephone visit only Patient does not have video Patient having significant trouble with diarrhea some abdominal cramping occasional yellow mucus in the stool denies high fever chills sweats wheezing difficulty breathing denies blood in the stool denies any significant fevers also states no recent travel [place] no one else is sick around him has not been on any antibiotics HPIdiarrhea Started after eating take out 3 days ago. Stool is yellow and does look like some mucus in it. No blood, no fever, no abdominal pain, no vomiting. Having a lot of gas and has diarrhea more than 10 times per day. States he is starting to feel a little weak and wife is getting him some pedialyte. Has not tried immodium or anything for diarrhea.    Virtual Visit via Telephone Note  I connected with Eugene [name] on 12/12/18 at 11:30 AM EDT by telephone and verified that I am speaking with the correct person using two identifiers.   I discussed the limitations, risks, security and privacy concerns of performing an evaluation and management service by telephone and the availability of in person appointments. I also discussed with the patient that there may be a patient responsible charge related to this service. The patient expressed understanding and agreed to proceed.   History of Present Illness:    Observations/Objective:   Assessment and Plan:   Follow Up Instructions:    I discussed the assessment and treatment plan with the patient. The patient was provided an opportunity to ask questions and all were answered. The patient agreed with the plan and demonstrated an understanding of the instructions.   The patient was advised to call back or seek an in-person evaluation if the symptoms worsen or if the condition fails to improve as anticipated.  I provided 15 minutes of non-face-to-face time  during this encounter.      Review of Systems     Objective:   Physical Exam   Unable to do physical exam through telephone     Assessment & Plan:  Based upon his symptomatology I believe the patient may have some secondary bacterial component going on since he is having some mucus because of coronavirus outbreak it is not practical for the patient to come for stool samples I believe it would be reasonable to use metronidazole 500 mg 3 times daily for the next 7 days May use Imodium as needed if bloody stools high fever or worse to notify us to give [place] update next week how he is doing

## 2018-12-24 ENCOUNTER — Ambulatory Visit: Payer: Medicare HMO | Admitting: Podiatry

## 2018-12-24 ENCOUNTER — Other Ambulatory Visit: Payer: Self-pay

## 2018-12-24 ENCOUNTER — Encounter: Payer: Self-pay | Admitting: Podiatry

## 2018-12-24 VITALS — Temp 97.7°F

## 2018-12-24 DIAGNOSIS — M79675 Pain in left toe(s): Secondary | ICD-10-CM | POA: Diagnosis not present

## 2018-12-24 DIAGNOSIS — E1142 Type 2 diabetes mellitus with diabetic polyneuropathy: Secondary | ICD-10-CM | POA: Diagnosis not present

## 2018-12-24 DIAGNOSIS — B351 Tinea unguium: Secondary | ICD-10-CM | POA: Diagnosis not present

## 2018-12-24 DIAGNOSIS — L84 Corns and callosities: Secondary | ICD-10-CM | POA: Diagnosis not present

## 2018-12-24 DIAGNOSIS — M79674 Pain in right toe(s): Secondary | ICD-10-CM

## 2018-12-24 NOTE — Patient Instructions (Addendum)

## 2018-12-25 ENCOUNTER — Other Ambulatory Visit: Payer: Self-pay | Admitting: Family Medicine

## 2018-12-26 ENCOUNTER — Ambulatory Visit: Payer: Medicare HMO | Admitting: Podiatry

## 2018-12-26 ENCOUNTER — Other Ambulatory Visit: Payer: Self-pay

## 2018-12-26 MED ORDER — GLIMEPIRIDE 1 MG PO TABS: 1.0000 mg | ORAL_TABLET | Freq: Every day | ORAL | 0 refills | Status: DC

## 2018-12-29 ENCOUNTER — Encounter: Payer: Self-pay | Admitting: Podiatry

## 2018-12-29 NOTE — Progress Notes (Signed)
Subjective: Eugene [name] presents with h/o diabetes and diabetic neuropathy for preventative diabetic foot care.  He has cc of painful, discolored, thick toenails and plantar callus left foot which poses a risk due to him having diabetic neuropathy.  Patient states he saw Dr. Cannon Kettle in February with small wound plantar left foot which has healed.   [name], MD is his PCP and last visit was 09/30/2018.   Current Outpatient Medications:  .  acetaminophen (TYLENOL) 500 MG tablet, Take 1,500 mg by mouth every 6 (six) hours as needed for mild pain, fever or headache. , Disp: , Rfl:  .  allopurinol (ZYLOPRIM) 100 MG tablet, Take one tablet by mouth daily (Patient taking differently: Take 100 mg by mouth daily as needed (for gout flare up). ), Disp: 90 tablet, Rfl: 1 .  ALPRAZolam (XANAX) 0.5 MG tablet, TAKE 1/2-1 TABLET BY MOUTH AT BEDTIME AS NEEDED, Disp: 30 tablet, Rfl: 5 .  aspirin 81 MG chewable tablet, Chew 81 mg by mouth at bedtime., Disp: , Rfl:  .  Cholecalciferol (VITAMIN D3 PO), Take 1 capsule by mouth every other day., Disp: , Rfl:  .  fenofibrate 160 MG tablet, TAKE 1 TABLET AT BEDTIME (Patient taking differently: Take 160 mg by mouth at bedtime. ), Disp: 90 tablet, Rfl: 0 .  glucose blood (ACCU-CHEK SMARTVIEW) test strip, TEST BLOOD SUGAR ONE TIME DAILY, Disp: 50 each, Rfl: 5 .  Insulin Pen Needle (BD PEN NEEDLE NANO U/F) 32G X 4 MM MISC, Use once daily as directed, Disp: 100 each, Rfl: 0 .  isosorbide mononitrate (IMDUR) 60 MG 24 hr tablet, TAKE 1 TABLET (60 MG TOTAL) BY MOUTH DAILY., Disp: 90 tablet, Rfl: 3 .  LEVEMIR FLEXTOUCH 100 UNIT/ML Pen, INJECT 80 UNITS INTO THE SKIN DAILY AT 10 PM. (Patient taking differently: Inject 70 Units into the skin daily at 10 pm. ), Disp: 30 mL, Rfl: 3 .  losartan (COZAAR) 25 MG tablet, , Disp: , Rfl:  .  metoprolol tartrate (LOPRESSOR) 25 MG tablet, TAKE 1 TABLET TWICE DAILY (Patient taking differently: Take 25 mg by mouth 2 (two) times  daily. ), Disp: 180 tablet, Rfl: 0 .  metroNIDAZOLE (FLAGYL) 500 MG tablet, Take 1 tablet (500 mg total) by mouth 3 (three) times daily., Disp: 21 tablet, Rfl: 0 .  torsemide (DEMADEX) 20 MG tablet, TAKE 1 TABLET (20 MG TOTAL) BY MOUTH DAILY. (Patient taking differently: Take 20-30 mg by mouth See admin instructions. Take 20 mg by mouth every other day alternating with 30 mg by mouth every other day), Disp: 90 tablet, Rfl: 0 .  traMADol (ULTRAM) 50 MG tablet, TAKE ONE TABLET BY MOUTH EVERY 6 HOURS AS NEEDED, Disp: 24 tablet, Rfl: 3 .  DROPLET PEN NEEDLES 32G X 6 MM MISC, USE AS DIRECTED, Disp: 300 each, Rfl: 0 .  glimepiride (AMARYL) 1 MG tablet, Take 1 tablet (1 mg total) by mouth at bedtime., Disp: 90 tablet, Rfl: 0  Allergies  Allergen Reactions  . Hydrocodone Nausea And Vomiting  . Lisinopril Cough  . Neurontin [Gabapentin] Other (See Comments)    Reaction:  Suicidal thoughts   . Statins Other (See Comments)    Reaction:  Muscle pain   . Metformin And Related Diarrhea  . Norvasc [Amlodipine Besylate] Swelling and Other (See Comments)    Reaction:  Pedal edema    Vascular Examination: Capillary refill time immediate x 10 digits.  Dorsalis pedis pulses palpable b/l.  Posterior tibial pulses palpable b/l.  Dgital hair present x 10 digits.  Skin temperature gradient WNL b/l.  Dermatological Examination: Skin with normal turgor, texture and tone b/l.  Toenails 1-5 b/l discolored, thick, dystrophic with subungual debris and pain with palpation to nailbeds due to thickness of nails.  Hyperkeratotic lesion submet head 3 left foot. No erythema, no edema, no drainage, no flocculence noted.   Musculoskeletal: Muscle strength 5/5 to all LE muscle groups  Neurological: Sensation absent with 10 gram monofilament.   Assessment: 1. Painful onychomycosis toenails 1-5 b/l 2. Callus submet head 3 left foot 3. NIDDM with Diabetic neuropathy  Plan: 1. Continue diabetic foot care  principles. Literature dispensed on today. 2. Toenails 1-5 b/l were debrided in length and girth without iatrogenic bleeding. 3. Calluses pared submetatarsal head(s) 3 left foot utilizing sterile scalpel blade without incident. 4. Patient to continue soft, supportive shoe gear 5. Patient to report any pedal injuries to medical professional  6. Will keep a closer eye on his plantar callus. Follow up 10 weeks instead of 3 months.  7. Patient/POA to call should there be a concern in the interim.

## 2018-12-30 ENCOUNTER — Ambulatory Visit: Payer: Medicare HMO | Admitting: Family Medicine

## 2019-01-01 DIAGNOSIS — E1165 Type 2 diabetes mellitus with hyperglycemia: Secondary | ICD-10-CM | POA: Diagnosis not present

## 2019-01-01 DIAGNOSIS — Z13220 Encounter for screening for lipoid disorders: Secondary | ICD-10-CM | POA: Diagnosis not present

## 2019-01-02 ENCOUNTER — Other Ambulatory Visit: Payer: Self-pay

## 2019-01-02 ENCOUNTER — Ambulatory Visit (INDEPENDENT_AMBULATORY_CARE_PROVIDER_SITE_OTHER): Payer: Medicare HMO | Admitting: Family Medicine

## 2019-01-02 DIAGNOSIS — E1122 Type 2 diabetes mellitus with diabetic chronic kidney disease: Secondary | ICD-10-CM | POA: Diagnosis not present

## 2019-01-02 DIAGNOSIS — I428 Other cardiomyopathies: Secondary | ICD-10-CM

## 2019-01-02 DIAGNOSIS — Z794 Long term (current) use of insulin: Secondary | ICD-10-CM

## 2019-01-02 DIAGNOSIS — M109 Gout, unspecified: Secondary | ICD-10-CM | POA: Diagnosis not present

## 2019-01-02 DIAGNOSIS — E7849 Other hyperlipidemia: Secondary | ICD-10-CM

## 2019-01-02 DIAGNOSIS — I5032 Chronic diastolic (congestive) heart failure: Secondary | ICD-10-CM | POA: Diagnosis not present

## 2019-01-02 DIAGNOSIS — E113393 Type 2 diabetes mellitus with moderate nonproliferative diabetic retinopathy without macular edema, bilateral: Secondary | ICD-10-CM | POA: Diagnosis not present

## 2019-01-02 DIAGNOSIS — N183 Chronic kidney disease, stage 3 (moderate): Secondary | ICD-10-CM

## 2019-01-02 LAB — LIPID PANEL
Chol/HDL Ratio: 7.2 ratio — ABNORMAL HIGH (ref 0.0–5.0)
Cholesterol, Total: 224 mg/dL — ABNORMAL HIGH (ref 100–199)
HDL: 31 mg/dL — ABNORMAL LOW (ref >39)
LDL Calculated: 125 mg/dL — ABNORMAL HIGH (ref 0–99)
Triglycerides: 338 mg/dL — ABNORMAL HIGH (ref 0–149)
VLDL CHOLESTEROL CAL: 68 mg/dL — AB (ref 5–40)

## 2019-01-02 LAB — HEMOGLOBIN A1C
Est. average glucose Bld gHb Est-mCnc: 189 mg/dL
Hgb A1c MFr Bld: 8.2 % — ABNORMAL HIGH (ref 4.8–5.6)

## 2019-01-02 NOTE — Progress Notes (Signed)
Subjective:    Patient ID: Eugene [name], male    DOB: April 04, 1956, 63 y.o.   MRN: [id number]  Diabetes  He presents for his follow-up diabetic visit. He has type 2 diabetes mellitus. There are no hypoglycemic associated symptoms. There are no diabetic associated symptoms. There are no hypoglycemic complications. There are no diabetic complications. He sees a podiatrist.Eye exam is current.   Pt states he is checking BP daily and is keeping a log of number. Pt states he had his lab work completed yesterday.  Patient for blood pressure check up.  The patient does have hypertension.  The patient is on medication.  Patient relates compliance with meds. Todays BP reviewed with the patient. Patient denies issues with medication. Patient relates reasonable diet. Patient tries to minimize salt. Patient aware of BP goals.  Patient here for follow-up regarding cholesterol.  The patient does have hyperlipidemia.  Patient does try to maintain a reasonable diet.  Patient does take the medication on a regular basis.  Denies missing a dose.  The patient denies any obvious side effects.  Prior blood work results reviewed with the patient.  The patient is aware of his cholesterol goals and the need to keep it under good control to lessen the risk of disease.  The patient's BMI is calculated.  The patient does have obesity.  The patient does try to some degree staying active and watching diet.  It is in the vital signs and acknowledged.  It is above the recommended BMI for the patient's height and weight.  The patient has been counseled regarding healthy diet, restricted portions, avoiding excessive carbohydrates/sugary foods, and increase physical activity as health permits.  It is in the patient's best interest to lower the risk of secondary illness including heart disease strokes and cancer by losing weight.  The patient acknowledges this information.  Patient does have cardiomyopathy as well as CHF his  weights have been staying stable he is trying to do the best he can at minimizing starches in the diet and try to stay as active as his heart disease allows him to  Patient does have retinopathy follows up with retina specialist on a regular basis denies any major issues  Has not had any gout flareups recently Virtual Visit via Telephone Note  I connected with Eugene [name] on 01/02/19 at 11:00 AM EDT by telephone and verified that I am speaking with the correct person using two identifiers.  Location: Patient: home Provider: offcie   I discussed the limitations, risks, security and privacy concerns of performing an evaluation and management service by telephone and the availability of in person appointments. I also discussed with the patient that there may be a patient responsible charge related to this service. The patient expressed understanding and agreed to proceed.   History of Present Illness:    Observations/Objective:   Assessment and Plan:   Follow Up Instructions:    I discussed the assessment and treatment plan with the patient. The patient was provided an opportunity to ask questions and all were answered. The patient agreed with the plan and demonstrated an understanding of the instructions.   The patient was advised to call back or seek an in-person evaluation if the symptoms worsen or if the condition fails to improve as anticipated.  I provided 26 minutes of non-face-to-face time during this encounter.   Vicente Males, LPN     Review of Systems     Objective:   Physical Exam  25 minutes was spent with the patient.  This statement verifies that 25 minutes was indeed spent with the patient.  More than 50% of this visit-total duration of the visit-was spent in counseling and coordination of care. The issues that the patient came in for today as reflected in the diagnosis (s) please refer to documentation for further details.      Assessment &  Plan:  HTN- Patient was seen today as part of a visit regarding hypertension. The importance of healthy diet and regular physical activity was discussed. The importance of compliance with medications discussed.  Ideal goal is to keep blood pressure low elevated levels certainly below 166/06 when possible.  The patient was counseled that keeping blood pressure under control lessen his risk of complications.  The importance of regular follow-ups was discussed with the patient.  Low-salt diet such as DASH recommended.  Regular physical activity was recommended as well.  Patient was advised to keep regular follow-ups.  The patient was seen today as part of an evaluation regarding hyperlipidemia.  Recent lab work has been reviewed with the patient as well as the goals for good cholesterol care.  In addition to this medications have been discussed the importance of compliance with diet and medications discussed as well.  Finally the patient is aware that poor control of cholesterol, noncompliance can dramatically increase the risk of complications. The patient will keep regular office visits and the patient does agreed to periodic lab work. Patient agrees to stop the fenofibrate Start Crestor 5 mg 2 days/week He will let us know how that is going Check lab work later in the summer follow-up in 4 months  Cardiomyopathy congestive heart failure stable weights are good watching diet taking medicines symptoms under good control  Morbid obesity patient trying to cut back on calories trying to lose weight  Retinopathy sees eye specialist regular basis  Diabetes subpar control he will check some sugar readings and then send them to [place]

## 2019-01-02 NOTE — Progress Notes (Signed)
done

## 2019-01-05 ENCOUNTER — Telehealth: Payer: Self-pay | Admitting: Family Medicine

## 2019-01-05 MED ORDER — ROSUVASTATIN CALCIUM 5 MG PO TABS: ORAL_TABLET | ORAL | 5 refills | Status: DC

## 2019-01-05 NOTE — Telephone Encounter (Signed)
Rosuvastatin 5 mg take on Mondays and Fridays, #8, 5 refills The patient had muscle aches with previous statins he is willing to try the statins again at a lower dose twice per week

## 2019-01-05 NOTE — Telephone Encounter (Signed)
Prescription sent electronically to pharmacy. Patient notified. 

## 2019-01-05 NOTE — Telephone Encounter (Signed)
Patient had a phone visit on 5/8 and was told  a prescription for cholesterol medication would be sent in. But went by pharmacy over the weekend nothing their. He uses [facility] in [place]

## 2019-01-06 ENCOUNTER — Other Ambulatory Visit: Payer: Self-pay | Admitting: *Deleted

## 2019-01-06 MED ORDER — GLIMEPIRIDE 1 MG PO TABS: 1.0000 mg | ORAL_TABLET | Freq: Every day | ORAL | 0 refills | Status: DC

## 2019-01-08 ENCOUNTER — Other Ambulatory Visit: Payer: Self-pay | Admitting: Family Medicine

## 2019-01-12 ENCOUNTER — Other Ambulatory Visit: Payer: Self-pay | Admitting: Family Medicine

## 2019-01-13 ENCOUNTER — Other Ambulatory Visit: Payer: Self-pay | Admitting: Pharmacist

## 2019-01-13 NOTE — Patient Outreach (Signed)
[facility]) Care Management  [place]   01/13/2019  [place] [birth year] [id number]  Reason for referral: Medication Assistance  Referral source: Patient self-referral Referral medication: Levemir Flextouch Current insurance: [place] a voicemail from Mr. Mote requesting call back.   Return call to patient. HIPAA identifiers verified.     Patient reports that he has entered the coverage gap of his insurance for the calendar year and is having difficulty with affording his Levemir. Note that we have assisted Mr. Oyen with applying for patient assistance for Levemir through the manufacturer in the past. Mr. Bossard reports that just recently picked up a month supply of his Levemir from his pharmacy.   Medication Assistance Findings:  Extra Help:   '[]'  Already receiving Full Extra Help  '[]'  Already receiving Partial Extra Help  '[]'  Eligible based on reported income and assets  '[x]'  Not Eligible based on reported income and assets  Patient Assistance Programs: 1) Levemir Flextouch made by [place] requirement met: '[x]'  Yes '[]'  No '[]'  Unknown o Out-of-pocket prescription expenditure met:    '[]'  Yes '[]'  No  '[]'  Unknown  '[x]'  Not applicable    Additional medication assistance options reviewed with patient as warranted:  -Discuss with patient the current [employer] supply offered by company to be used if necessary.   Plan: I will route patient assistance letter to [place] technician who will coordinate patient assistance program application process for medications listed above.  [facility] pharmacy technician will assist with obtaining all required documents from both patient and provider and submit application once completed.    Harlow Asa, PharmD, [facility] [phone]

## 2019-01-14 ENCOUNTER — Other Ambulatory Visit: Payer: Self-pay | Admitting: Pharmacy Technician

## 2019-01-14 DIAGNOSIS — S335XXA Sprain of ligaments of lumbar spine, initial encounter: Secondary | ICD-10-CM | POA: Diagnosis not present

## 2019-01-14 DIAGNOSIS — M9902 Segmental and somatic dysfunction of thoracic region: Secondary | ICD-10-CM | POA: Diagnosis not present

## 2019-01-14 DIAGNOSIS — S134XXA Sprain of ligaments of cervical spine, initial encounter: Secondary | ICD-10-CM | POA: Diagnosis not present

## 2019-01-14 DIAGNOSIS — M9901 Segmental and somatic dysfunction of cervical region: Secondary | ICD-10-CM | POA: Diagnosis not present

## 2019-01-14 DIAGNOSIS — S233XXA Sprain of ligaments of thoracic spine, initial encounter: Secondary | ICD-10-CM | POA: Diagnosis not present

## 2019-01-14 DIAGNOSIS — M9903 Segmental and somatic dysfunction of lumbar region: Secondary | ICD-10-CM | POA: Diagnosis not present

## 2019-01-14 NOTE — Patient Outreach (Signed)
[place] [facility]) Care Management  01/14/2019  [place] March 28, 1956 [id number]                           Medication Assistance Referral  Referral From: [facility] RPh Dorthula Perfect  Medication/Company: Levemir FlexTouch / [employer] Patient application portion:  Education officer, museum portion: Faxed  to Dr Nicki Reaper [name]    Follow up:  Will follow up with patient in 5-10 business days to confirm application(s) have been received.  Bernese Doffing P. Yariel Ferraris, [place] Management [phone]

## 2019-01-16 NOTE — Telephone Encounter (Signed)
Pharmacy requesting clarification on Toresemide 20 mg tab.  Directions state Take 1-1.5 tablet by mouth. Take 20 mg by mouth every other day alternating with 30 mg. Please advise. Thank you

## 2019-01-19 ENCOUNTER — Telehealth: Payer: Self-pay | Admitting: Family Medicine

## 2019-01-19 NOTE — Telephone Encounter (Signed)
  Nurses-please see sheet from drug company.  Verify with the patient what needles he uses and indicate those plus also 37-month quantity fill that in please.  Also Levemir flex touch maximum amount of units per day that he is utilizing please put that also within the form then may send it via fax back to [facility] thank you

## 2019-01-20 ENCOUNTER — Other Ambulatory Visit: Payer: Self-pay | Admitting: Family Medicine

## 2019-01-20 DIAGNOSIS — S134XXA Sprain of ligaments of cervical spine, initial encounter: Secondary | ICD-10-CM | POA: Diagnosis not present

## 2019-01-20 DIAGNOSIS — M9902 Segmental and somatic dysfunction of thoracic region: Secondary | ICD-10-CM | POA: Diagnosis not present

## 2019-01-20 DIAGNOSIS — S233XXA Sprain of ligaments of thoracic spine, initial encounter: Secondary | ICD-10-CM | POA: Diagnosis not present

## 2019-01-20 DIAGNOSIS — M9903 Segmental and somatic dysfunction of lumbar region: Secondary | ICD-10-CM | POA: Diagnosis not present

## 2019-01-20 DIAGNOSIS — M9901 Segmental and somatic dysfunction of cervical region: Secondary | ICD-10-CM | POA: Diagnosis not present

## 2019-01-20 DIAGNOSIS — S335XXA Sprain of ligaments of lumbar spine, initial encounter: Secondary | ICD-10-CM | POA: Diagnosis not present

## 2019-01-20 NOTE — Telephone Encounter (Signed)
I believe that is how he is taking the medicine but I think it would be wise to touch base with the patient to clarify how he is taking now (this patient is also under the care of cardiology who may be changing his medicine directions as well so therefore I think it is best to touch base with the patient before we can clarify with pharmacy thank you) Touch base with patient Give me feedback thank you

## 2019-01-21 ENCOUNTER — Other Ambulatory Visit: Payer: Self-pay | Admitting: Family Medicine

## 2019-01-21 MED ORDER — TORSEMIDE 20 MG PO TABS: ORAL_TABLET | ORAL | 1 refills | Status: DC

## 2019-01-21 NOTE — Telephone Encounter (Signed)
I spoke with the pt he states he had been taking Toresemide 20 mg 1 alt with 1 1/2 the following day,but the last two days he has been taking two tablets alt with one the following day.[name] He states this is helping him as he has lost two pds since yesterday.

## 2019-01-21 NOTE — Telephone Encounter (Signed)
Please check with Danae Chen The forms were dropped off of my chair today

## 2019-01-21 NOTE — Telephone Encounter (Signed)
Contacted patient and informed patient that we would be sending in Torsemide 20 mg take 1-2 tablets each morning as directed by physician. Pt prefers 90 day supply. Sent to [facility]. Pt verbalized understanding.

## 2019-01-21 NOTE — Telephone Encounter (Signed)
I recommend with the prescription that it be the following Torsemide 20 mg 1 to 2 tablets taken each morning as directed by physician #60 5 refills  The patient may stick with how he is doing at 2 tablets on 1 day, 1 tablet the following day If they would rather do a 90-day prescription with 1 additional refill that would be fine

## 2019-01-21 NOTE — Telephone Encounter (Signed)
Patient states he does not need any needles,but he does take Levemir 70 units per day. There is no form at the nursing station.

## 2019-01-22 ENCOUNTER — Other Ambulatory Visit: Payer: Self-pay | Admitting: Family Medicine

## 2019-01-22 NOTE — Telephone Encounter (Signed)
This is a duplicate I just sent a prescription for this sent earlier today

## 2019-01-23 ENCOUNTER — Other Ambulatory Visit: Payer: Self-pay | Admitting: Pharmacy Technician

## 2019-01-23 DIAGNOSIS — S335XXA Sprain of ligaments of lumbar spine, initial encounter: Secondary | ICD-10-CM | POA: Diagnosis not present

## 2019-01-23 DIAGNOSIS — M9902 Segmental and somatic dysfunction of thoracic region: Secondary | ICD-10-CM | POA: Diagnosis not present

## 2019-01-23 DIAGNOSIS — M9903 Segmental and somatic dysfunction of lumbar region: Secondary | ICD-10-CM | POA: Diagnosis not present

## 2019-01-23 DIAGNOSIS — M9901 Segmental and somatic dysfunction of cervical region: Secondary | ICD-10-CM | POA: Diagnosis not present

## 2019-01-23 DIAGNOSIS — S134XXA Sprain of ligaments of cervical spine, initial encounter: Secondary | ICD-10-CM | POA: Diagnosis not present

## 2019-01-23 DIAGNOSIS — S233XXA Sprain of ligaments of thoracic spine, initial encounter: Secondary | ICD-10-CM | POA: Diagnosis not present

## 2019-01-23 NOTE — Patient Outreach (Signed)
[facility]) Care Management  01/23/2019  [place] Jul 19, 1956 [id number]   Successful outreach call placed to patient in regards to Levemir application with [employer].  Spoke to patient, HIPAA identifiers verified.   Patient informed he had received the application in the mail. He had a question about the types of proof of income needed. He verbalized understanding. Patient informed once he got the proof of income together then he would mail back the application. He hoped to have this done by early next week.  Will followup with patient in 10-14 business days if application has not been received back.  Roshun Klingensmith P. Raianna Slight, [place] Management [phone]

## 2019-01-24 ENCOUNTER — Telehealth: Payer: Self-pay | Admitting: Family Medicine

## 2019-01-24 NOTE — Telephone Encounter (Signed)
Good afternoon, IF possible could the patient assistance applications be sent back to my attention. I have the patient's part of the application. I can place the provider's part with the patient's part and submit the entire application at 1 time. Thanks and please don' hesitate to call me if there are any questions. Jill P. Simcox, [place] Management [phone] (fax)  Erica-I had filled out this form several days ago and forwarded it to the front possibly the nurses had not possibly is at the front  Please handle form/forward the form to [place] please

## 2019-01-26 DIAGNOSIS — S134XXA Sprain of ligaments of cervical spine, initial encounter: Secondary | ICD-10-CM | POA: Diagnosis not present

## 2019-01-26 DIAGNOSIS — M9902 Segmental and somatic dysfunction of thoracic region: Secondary | ICD-10-CM | POA: Diagnosis not present

## 2019-01-26 DIAGNOSIS — M9901 Segmental and somatic dysfunction of cervical region: Secondary | ICD-10-CM | POA: Diagnosis not present

## 2019-01-26 DIAGNOSIS — S233XXA Sprain of ligaments of thoracic spine, initial encounter: Secondary | ICD-10-CM | POA: Diagnosis not present

## 2019-01-26 DIAGNOSIS — S335XXA Sprain of ligaments of lumbar spine, initial encounter: Secondary | ICD-10-CM | POA: Diagnosis not present

## 2019-01-26 DIAGNOSIS — M9903 Segmental and somatic dysfunction of lumbar region: Secondary | ICD-10-CM | POA: Diagnosis not present

## 2019-01-30 DIAGNOSIS — M9902 Segmental and somatic dysfunction of thoracic region: Secondary | ICD-10-CM | POA: Diagnosis not present

## 2019-01-30 DIAGNOSIS — M9901 Segmental and somatic dysfunction of cervical region: Secondary | ICD-10-CM | POA: Diagnosis not present

## 2019-01-30 DIAGNOSIS — S233XXA Sprain of ligaments of thoracic spine, initial encounter: Secondary | ICD-10-CM | POA: Diagnosis not present

## 2019-01-30 DIAGNOSIS — M9903 Segmental and somatic dysfunction of lumbar region: Secondary | ICD-10-CM | POA: Diagnosis not present

## 2019-01-30 DIAGNOSIS — S134XXA Sprain of ligaments of cervical spine, initial encounter: Secondary | ICD-10-CM | POA: Diagnosis not present

## 2019-01-30 DIAGNOSIS — S335XXA Sprain of ligaments of lumbar spine, initial encounter: Secondary | ICD-10-CM | POA: Diagnosis not present

## 2019-02-06 ENCOUNTER — Other Ambulatory Visit: Payer: Self-pay | Admitting: Pharmacy Technician

## 2019-02-06 NOTE — Patient Outreach (Signed)
[facility]) Care Management  02/06/2019  Eugene [name] September 17, 1955 [id number]    Care coordination call placed to Dr Lance Sell office.  Spoke to [place] concerning patient's application with [employer] for [employer].  Eugene [name] was unable to locate the form and requestedd that it be refaxed.  Refaxed form to Eugene [name]'s attention.  Will followup with provider's office in 3-5 business days if not received.  Eugene [name], [place] Management [phone]

## 2019-02-09 ENCOUNTER — Telehealth: Payer: Self-pay | Admitting: Family Medicine

## 2019-02-09 NOTE — Telephone Encounter (Signed)
[place]otts 

## 2019-02-09 NOTE — Telephone Encounter (Signed)
Please resign form got misplaced last time and they are requesting it again.please review sign and date in your folder in box.

## 2019-02-10 NOTE — Telephone Encounter (Signed)
I filled out this form it does need additional information After filling in additional information please forward to [place]

## 2019-02-11 ENCOUNTER — Encounter (INDEPENDENT_AMBULATORY_CARE_PROVIDER_SITE_OTHER): Payer: Medicare HMO | Admitting: Ophthalmology

## 2019-02-12 ENCOUNTER — Other Ambulatory Visit: Payer: Self-pay | Admitting: Pharmacy Technician

## 2019-02-12 NOTE — Patient Outreach (Signed)
[facility]) Care Management  02/12/2019  [place] 1956-07-20 [id number]    Received all necessary documents and signatures for [employer] patient assistance for Levemir.  Submitted completed application via fax.  Will followup with [facility] Nordisk in 2-5 business days to inquire on status of application.  Mashonda Broski P. Ayomide Purdy, [place] Management [phone]

## 2019-02-16 ENCOUNTER — Encounter (INDEPENDENT_AMBULATORY_CARE_PROVIDER_SITE_OTHER): Payer: Medicare HMO | Admitting: Ophthalmology

## 2019-02-16 ENCOUNTER — Other Ambulatory Visit: Payer: Self-pay

## 2019-02-16 DIAGNOSIS — E113512 Type 2 diabetes mellitus with proliferative diabetic retinopathy with macular edema, left eye: Secondary | ICD-10-CM | POA: Diagnosis not present

## 2019-02-16 DIAGNOSIS — H43813 Vitreous degeneration, bilateral: Secondary | ICD-10-CM | POA: Diagnosis not present

## 2019-02-16 DIAGNOSIS — E113311 Type 2 diabetes mellitus with moderate nonproliferative diabetic retinopathy with macular edema, right eye: Secondary | ICD-10-CM | POA: Diagnosis not present

## 2019-02-16 DIAGNOSIS — H35033 Hypertensive retinopathy, bilateral: Secondary | ICD-10-CM

## 2019-02-16 DIAGNOSIS — I10 Essential (primary) hypertension: Secondary | ICD-10-CM

## 2019-02-16 DIAGNOSIS — E11311 Type 2 diabetes mellitus with unspecified diabetic retinopathy with macular edema: Secondary | ICD-10-CM

## 2019-02-17 ENCOUNTER — Other Ambulatory Visit: Payer: Self-pay | Admitting: Pharmacy Technician

## 2019-02-17 NOTE — Patient Outreach (Signed)
[facility] [facility]) Care Management  02/17/2019  [place] 14-Sep-1955 [id number]    Care coordination call placed to [place] in regards to patient's application for Levemir FlexTouch.  Spoke to [place] who informed patient had been APPROVED for the program 02/17/2019-07/27/2019. Nimra informed the 1st order is being fulfilled. She informed patient will be receiving 6 boxes delivered to the provider's office within 10-14 business days. She informed patient has 1 more refill remaining which the provider can be reordered 80 days after the 1st shipment.  Will followup with patient in 10-14 business days to inquire medication has been received.  Gisselle Galvis P. Johnnathan Hagemeister, [place] Management [phone]

## 2019-02-19 ENCOUNTER — Other Ambulatory Visit: Payer: Self-pay | Admitting: Family Medicine

## 2019-02-19 ENCOUNTER — Other Ambulatory Visit: Payer: Self-pay | Admitting: Pharmacy Technician

## 2019-02-19 NOTE — Patient Outreach (Signed)
[facility]) Care Management  02/19/2019  Jaston Havens Di[place] Apr 21, 1956 [id number]    Successful outgoing call placed to patient in regards to [employer] application for [employer].  Spoke to patient, HIPAA identifiers verified. Was returning patient's call.   Informed patient he had been approved for the program until 07/27/2019. Informed patient medication was being fulfilled in an order to send it to his providers office within the next 7-14 business days. Patient informed he only had 1 pen left and was trying not to have to purchase any as medication was expensive. Informed patient to check with provider's office regarding samples and check with pharmacy to inquire if patient can purchase 1 or 2 pens vs the whole box to reduce costs. Patient verbalized understanding.  Will followup with patient in 7-14 business days to inquire if medication was picked up from provider's office.  Yenny Kosa P. Britanee Vanblarcom, [place] Management [phone]

## 2019-02-20 ENCOUNTER — Other Ambulatory Visit: Payer: Self-pay | Admitting: Pharmacist

## 2019-02-20 ENCOUNTER — Telehealth: Payer: Self-pay | Admitting: Family Medicine

## 2019-02-20 ENCOUNTER — Other Ambulatory Visit: Payer: Self-pay | Admitting: Pharmacy Technician

## 2019-02-20 ENCOUNTER — Other Ambulatory Visit: Payer: Self-pay | Admitting: *Deleted

## 2019-02-20 MED ORDER — LEVEMIR FLEXTOUCH 100 UNIT/ML ~~LOC~~ SOPN: 80.0000 [IU] | PEN_INJECTOR | Freq: Every day | SUBCUTANEOUS | 5 refills | Status: DC

## 2019-02-20 NOTE — Patient Outreach (Signed)
[place] [facility]) Care Management  02/20/2019  [place] Mar 22, 1956 [id number]   Successful outgoing call placed to patient in regards to [employer] application for [employer].  Was returning patient's phone call.   Spoke to patient, HIPAA identifiers verified.  Patient was calling to followup on the phone call we had yesterday where patient informed that he only had 1 pen remaining. Patient informed  that he had asked his doctor for samples of the medication and he was informed they no longer carry samples. He informed he had his provider call in a supply of the medication into his local pharmacy and patient informed that 5 pens or 1 box was going to cost him $125 which is cost prohibitive for him. He informed the pharmacy he uses will not break open the boxes and dispense 1 or 2 pens.  Will [employer] to inquire if they can expedite the order.   Eugene [name], [place] Management [phone]

## 2019-02-20 NOTE — Patient Outreach (Signed)
[place] [facility]) Care Management  02/20/2019  [place] [birth year] [id number]   Receive InBasket message from [place] Simcox letting me know that Mr. Majeed has been approved for patient assistance from [employer] for [employer]. However, patient states that he will run out of his Levemir before the assistance program supply arrives and he is unable to afford the copayment to pick up his prescription from the pharmacy.   Outreach to call to Mr. Groeneveld. Advise patient about the Immediate Supply program from [employer], which provides patients who are at risk of running out or rationing their insulin with a one-time free supply, filled through their pharmacy. Provide Mr. Deemer with the phone number for this program, [phone].  Patient denies any further medication questions/concerns at this time.  Harlow Asa, PharmD, [place] Management [phone]

## 2019-02-20 NOTE — Telephone Encounter (Signed)
Pt is getting insulin through a prescription assistance program and if we change his order back to 10 pens he could get it all for free through the program  [employer] in [place]

## 2019-02-20 NOTE — Telephone Encounter (Signed)
rx changed to 10 pens and sent to pharm. Pt notified.

## 2019-02-20 NOTE — Telephone Encounter (Signed)
Wants to talk to Eugene [name] about his Insulin Pens again.

## 2019-02-20 NOTE — Patient Outreach (Signed)
[facility]) Care Management  02/20/2019  [place] 1955/10/30 [id number]   ADDENDUM  Care coordination call placed to [place] in regards to patient's Levemir application.  Spoke to [facility] to inquire if the order could be expedited. Charlissa informed they could expedite the order but it would not go out until Monday so the earliest the patient could possibly have the medication would be 3 business days from Monday so that would mean it would arrive either Wednesday or Thursday to the provider's office.  Will route note to [place] for assistance.  Arvel Oquinn P. Anthany Thornhill, [place] Management [phone]

## 2019-02-22 ENCOUNTER — Inpatient Hospital Stay (HOSPITAL_COMMUNITY): Payer: Medicare HMO

## 2019-02-22 ENCOUNTER — Emergency Department (HOSPITAL_COMMUNITY): Payer: Medicare HMO

## 2019-02-22 ENCOUNTER — Other Ambulatory Visit: Payer: Self-pay

## 2019-02-22 ENCOUNTER — Encounter (HOSPITAL_COMMUNITY): Payer: Self-pay | Admitting: Emergency Medicine

## 2019-02-22 ENCOUNTER — Inpatient Hospital Stay (HOSPITAL_COMMUNITY)
Admission: EM | Admit: 2019-02-22 | Discharge: 2019-02-24 | DRG: 291 | Disposition: A | Discharge status: Home / Self Care | Payer: Medicare HMO | Attending: Internal Medicine | Admitting: Internal Medicine

## 2019-02-22 DIAGNOSIS — I10 Essential (primary) hypertension: Secondary | ICD-10-CM | POA: Diagnosis not present

## 2019-02-22 DIAGNOSIS — I25119 Atherosclerotic heart disease of native coronary artery with unspecified angina pectoris: Secondary | ICD-10-CM | POA: Diagnosis not present

## 2019-02-22 DIAGNOSIS — G4733 Obstructive sleep apnea (adult) (pediatric): Secondary | ICD-10-CM | POA: Diagnosis present

## 2019-02-22 DIAGNOSIS — I2510 Atherosclerotic heart disease of native coronary artery without angina pectoris: Secondary | ICD-10-CM | POA: Diagnosis present

## 2019-02-22 DIAGNOSIS — I5033 Acute on chronic diastolic (congestive) heart failure: Secondary | ICD-10-CM | POA: Diagnosis present

## 2019-02-22 DIAGNOSIS — Z888 Allergy status to other drugs, medicaments and biological substances status: Secondary | ICD-10-CM

## 2019-02-22 DIAGNOSIS — E1122 Type 2 diabetes mellitus with diabetic chronic kidney disease: Secondary | ICD-10-CM | POA: Diagnosis not present

## 2019-02-22 DIAGNOSIS — Z823 Family history of stroke: Secondary | ICD-10-CM

## 2019-02-22 DIAGNOSIS — I429 Cardiomyopathy, unspecified: Secondary | ICD-10-CM | POA: Diagnosis not present

## 2019-02-22 DIAGNOSIS — Z833 Family history of diabetes mellitus: Secondary | ICD-10-CM

## 2019-02-22 DIAGNOSIS — E1165 Type 2 diabetes mellitus with hyperglycemia: Secondary | ICD-10-CM | POA: Diagnosis not present

## 2019-02-22 DIAGNOSIS — Z6834 Body mass index (BMI) 34.0-34.9, adult: Secondary | ICD-10-CM

## 2019-02-22 DIAGNOSIS — Z8249 Family history of ischemic heart disease and other diseases of the circulatory system: Secondary | ICD-10-CM

## 2019-02-22 DIAGNOSIS — E669 Obesity, unspecified: Secondary | ICD-10-CM | POA: Diagnosis present

## 2019-02-22 DIAGNOSIS — R0602 Shortness of breath: Secondary | ICD-10-CM | POA: Diagnosis not present

## 2019-02-22 DIAGNOSIS — I130 Hypertensive heart and chronic kidney disease with heart failure and stage 1 through stage 4 chronic kidney disease, or unspecified chronic kidney disease: Principal | ICD-10-CM | POA: Diagnosis present

## 2019-02-22 DIAGNOSIS — E0865 Diabetes mellitus due to underlying condition with hyperglycemia: Secondary | ICD-10-CM | POA: Diagnosis not present

## 2019-02-22 DIAGNOSIS — Z9889 Other specified postprocedural states: Secondary | ICD-10-CM | POA: Diagnosis not present

## 2019-02-22 DIAGNOSIS — Z794 Long term (current) use of insulin: Secondary | ICD-10-CM | POA: Diagnosis not present

## 2019-02-22 DIAGNOSIS — F419 Anxiety disorder, unspecified: Secondary | ICD-10-CM | POA: Diagnosis present

## 2019-02-22 DIAGNOSIS — I110 Hypertensive heart disease with heart failure: Secondary | ICD-10-CM | POA: Diagnosis not present

## 2019-02-22 DIAGNOSIS — I252 Old myocardial infarction: Secondary | ICD-10-CM | POA: Diagnosis not present

## 2019-02-22 DIAGNOSIS — I4510 Unspecified right bundle-branch block: Secondary | ICD-10-CM | POA: Diagnosis present

## 2019-02-22 DIAGNOSIS — I5043 Acute on chronic combined systolic (congestive) and diastolic (congestive) heart failure: Secondary | ICD-10-CM | POA: Diagnosis not present

## 2019-02-22 DIAGNOSIS — N183 Chronic kidney disease, stage 3 (moderate): Secondary | ICD-10-CM | POA: Diagnosis present

## 2019-02-22 DIAGNOSIS — I5041 Acute combined systolic (congestive) and diastolic (congestive) heart failure: Secondary | ICD-10-CM | POA: Diagnosis not present

## 2019-02-22 DIAGNOSIS — I313 Pericardial effusion (noninflammatory): Secondary | ICD-10-CM | POA: Diagnosis present

## 2019-02-22 DIAGNOSIS — E113393 Type 2 diabetes mellitus with moderate nonproliferative diabetic retinopathy without macular edema, bilateral: Secondary | ICD-10-CM | POA: Diagnosis present

## 2019-02-22 DIAGNOSIS — Z951 Presence of aortocoronary bypass graft: Secondary | ICD-10-CM | POA: Diagnosis not present

## 2019-02-22 DIAGNOSIS — M109 Gout, unspecified: Secondary | ICD-10-CM | POA: Diagnosis present

## 2019-02-22 DIAGNOSIS — Z955 Presence of coronary angioplasty implant and graft: Secondary | ICD-10-CM

## 2019-02-22 DIAGNOSIS — Z9119 Patient's noncompliance with other medical treatment and regimen: Secondary | ICD-10-CM

## 2019-02-22 DIAGNOSIS — Z20828 Contact with and (suspected) exposure to other viral communicable diseases: Secondary | ICD-10-CM | POA: Diagnosis present

## 2019-02-22 DIAGNOSIS — I440 Atrioventricular block, first degree: Secondary | ICD-10-CM | POA: Diagnosis present

## 2019-02-22 DIAGNOSIS — E785 Hyperlipidemia, unspecified: Secondary | ICD-10-CM | POA: Diagnosis not present

## 2019-02-22 DIAGNOSIS — Z9861 Coronary angioplasty status: Secondary | ICD-10-CM | POA: Diagnosis present

## 2019-02-22 DIAGNOSIS — I509 Heart failure, unspecified: Secondary | ICD-10-CM | POA: Insufficient documentation

## 2019-02-22 DIAGNOSIS — Z820 Family history of epilepsy and other diseases of the nervous system: Secondary | ICD-10-CM

## 2019-02-22 DIAGNOSIS — E1143 Type 2 diabetes mellitus with diabetic autonomic (poly)neuropathy: Secondary | ICD-10-CM | POA: Diagnosis present

## 2019-02-22 DIAGNOSIS — Z77090 Contact with and (suspected) exposure to asbestos: Secondary | ICD-10-CM | POA: Diagnosis present

## 2019-02-22 DIAGNOSIS — I3139 Other pericardial effusion (noninflammatory): Secondary | ICD-10-CM | POA: Diagnosis present

## 2019-02-22 DIAGNOSIS — K3184 Gastroparesis: Secondary | ICD-10-CM | POA: Diagnosis present

## 2019-02-22 DIAGNOSIS — J449 Chronic obstructive pulmonary disease, unspecified: Secondary | ICD-10-CM | POA: Diagnosis present

## 2019-02-22 DIAGNOSIS — Z79899 Other long term (current) drug therapy: Secondary | ICD-10-CM

## 2019-02-22 DIAGNOSIS — Z7982 Long term (current) use of aspirin: Secondary | ICD-10-CM

## 2019-02-22 DIAGNOSIS — Z885 Allergy status to narcotic agent status: Secondary | ICD-10-CM

## 2019-02-22 DIAGNOSIS — Z9111 Patient's noncompliance with dietary regimen: Secondary | ICD-10-CM | POA: Diagnosis not present

## 2019-02-22 HISTORY — DX: Polyneuropathy, unspecified: G62.9

## 2019-02-22 HISTORY — DX: Chronic kidney disease, stage 3 unspecified: N18.30

## 2019-02-22 HISTORY — DX: Other specified health status: Z78.9

## 2019-02-22 LAB — ECHOCARDIOGRAM COMPLETE
Height: 73 in
Weight: 4208 oz

## 2019-02-22 LAB — CBC WITH DIFFERENTIAL/PLATELET
Abs Immature Granulocytes: 0.03 10*3/uL (ref 0.00–0.07)
Basophils Absolute: 0 10*3/uL (ref 0.0–0.1)
Basophils Relative: 1 %
Eosinophils Absolute: 0.1 10*3/uL (ref 0.0–0.5)
Eosinophils Relative: 2 %
HCT: 36.6 % — ABNORMAL LOW (ref 39.0–52.0)
Hemoglobin: 11.8 g/dL — ABNORMAL LOW (ref 13.0–17.0)
Immature Granulocytes: 1 %
Lymphocytes Relative: 20 %
Lymphs Abs: 1.1 10*3/uL (ref 0.7–4.0)
MCH: 30.7 pg (ref 26.0–34.0)
MCHC: 32.2 g/dL (ref 30.0–36.0)
MCV: 95.3 fL (ref 80.0–100.0)
Monocytes Absolute: 0.5 10*3/uL (ref 0.1–1.0)
Monocytes Relative: 9 %
Neutro Abs: 3.7 10*3/uL (ref 1.7–7.7)
Neutrophils Relative %: 67 %
Platelets: 164 10*3/uL (ref 150–400)
RBC: 3.84 MIL/uL — ABNORMAL LOW (ref 4.22–5.81)
RDW: 14.4 % (ref 11.5–15.5)
WBC: 5.5 10*3/uL (ref 4.0–10.5)
nRBC: 0 % (ref 0.0–0.2)

## 2019-02-22 LAB — BRAIN NATRIURETIC PEPTIDE: B Natriuretic Peptide: 344 pg/mL — ABNORMAL HIGH (ref 0.0–100.0)

## 2019-02-22 LAB — BASIC METABOLIC PANEL
Anion gap: 11 (ref 5–15)
BUN: 40 mg/dL — ABNORMAL HIGH (ref 8–23)
CO2: 26 mmol/L (ref 22–32)
Calcium: 9.3 mg/dL (ref 8.9–10.3)
Chloride: 101 mmol/L (ref 98–111)
Creatinine, Ser: 1.52 mg/dL — ABNORMAL HIGH (ref 0.61–1.24)
GFR calc Af Amer: 56 mL/min — ABNORMAL LOW (ref >60)
GFR calc non Af Amer: 48 mL/min — ABNORMAL LOW (ref >60)
Glucose, Bld: 124 mg/dL — ABNORMAL HIGH (ref 70–99)
Potassium: 4.6 mmol/L (ref 3.5–5.1)
Sodium: 138 mmol/L (ref 135–145)

## 2019-02-22 LAB — SARS CORONAVIRUS 2 BY RT PCR (HOSPITAL ORDER, PERFORMED IN ~~LOC~~ HOSPITAL LAB): SARS Coronavirus 2: NEGATIVE

## 2019-02-22 LAB — MAGNESIUM: Magnesium: 1.9 mg/dL (ref 1.7–2.4)

## 2019-02-22 LAB — GLUCOSE, CAPILLARY
Glucose-Capillary: 107 mg/dL — ABNORMAL HIGH (ref 70–99)
Glucose-Capillary: 215 mg/dL — ABNORMAL HIGH (ref 70–99)

## 2019-02-22 LAB — TROPONIN I (HIGH SENSITIVITY)
Troponin I (High Sensitivity): 46 ng/L — ABNORMAL HIGH (ref <18)
Troponin I (High Sensitivity): 41 ng/L — ABNORMAL HIGH (ref <18)

## 2019-02-22 MED ORDER — NITROGLYCERIN 2 % TD OINT
1.0000 [in_us] | TOPICAL_OINTMENT | Freq: Once | TRANSDERMAL | Status: AC
  Administered 2019-02-22: 1 [in_us] via TOPICAL
  Filled 2019-02-22: qty 30

## 2019-02-22 MED ORDER — ACETAMINOPHEN 325 MG PO TABS: 650.0000 mg | ORAL_TABLET | Freq: Four times a day (QID) | ORAL | Status: DC | PRN

## 2019-02-22 MED ORDER — ACETAMINOPHEN 650 MG RE SUPP: 650.0000 mg | Freq: Four times a day (QID) | RECTAL | Status: DC | PRN

## 2019-02-22 MED ORDER — METOPROLOL TARTRATE 25 MG PO TABS
25.0000 mg | ORAL_TABLET | Freq: Once | ORAL | Status: DC
  Filled 2019-02-22: qty 1

## 2019-02-22 MED ORDER — SODIUM CHLORIDE 0.9 % IV SOLN: 250.0000 mL | INTRAVENOUS | Status: DC | PRN

## 2019-02-22 MED ORDER — POLYETHYLENE GLYCOL 3350 17 G PO PACK: 17.0000 g | PACK | Freq: Every day | ORAL | Status: DC | PRN

## 2019-02-22 MED ORDER — ISOSORBIDE MONONITRATE ER 60 MG PO TB24
60.0000 mg | ORAL_TABLET | Freq: Every day | ORAL | Status: DC
  Administered 2019-02-22 – 2019-02-24 (×3): 60 mg via ORAL
  Filled 2019-02-22 (×5): qty 1

## 2019-02-22 MED ORDER — ASPIRIN 81 MG PO CHEW
81.0000 mg | CHEWABLE_TABLET | Freq: Every day | ORAL | Status: DC
  Administered 2019-02-22 – 2019-02-23 (×2): 81 mg via ORAL
  Filled 2019-02-22 (×2): qty 1

## 2019-02-22 MED ORDER — LOSARTAN POTASSIUM 25 MG PO TABS
25.0000 mg | ORAL_TABLET | Freq: Every day | ORAL | Status: DC
  Administered 2019-02-22 – 2019-02-24 (×3): 25 mg via ORAL
  Filled 2019-02-22 (×3): qty 1

## 2019-02-22 MED ORDER — INSULIN ASPART 100 UNIT/ML ~~LOC~~ SOLN
0.0000 [IU] | Freq: Every day | SUBCUTANEOUS | Status: DC
  Administered 2019-02-22: 2 [IU] via SUBCUTANEOUS
  Administered 2019-02-23: 5 [IU] via SUBCUTANEOUS

## 2019-02-22 MED ORDER — OFLOXACIN 0.3 % OP SOLN
5.0000 [drp] | Freq: Every day | OPHTHALMIC | Status: DC | PRN
  Filled 2019-02-22: qty 5

## 2019-02-22 MED ORDER — INSULIN ASPART 100 UNIT/ML ~~LOC~~ SOLN
0.0000 [IU] | Freq: Three times a day (TID) | SUBCUTANEOUS | Status: DC
  Administered 2019-02-23 (×2): 7 [IU] via SUBCUTANEOUS
  Administered 2019-02-23: 11 [IU] via SUBCUTANEOUS
  Administered 2019-02-24: 7 [IU] via SUBCUTANEOUS

## 2019-02-22 MED ORDER — TRAZODONE HCL 50 MG PO TABS: 50.0000 mg | ORAL_TABLET | Freq: Every evening | ORAL | Status: DC | PRN

## 2019-02-22 MED ORDER — POTASSIUM CHLORIDE CRYS ER 20 MEQ PO TBCR
20.0000 meq | EXTENDED_RELEASE_TABLET | Freq: Once | ORAL | Status: AC
  Administered 2019-02-22: 20 meq via ORAL
  Filled 2019-02-22: qty 1

## 2019-02-22 MED ORDER — FUROSEMIDE 10 MG/ML IJ SOLN
80.0000 mg | Freq: Once | INTRAVENOUS | Status: AC
  Administered 2019-02-22: 80 mg via INTRAVENOUS
  Filled 2019-02-22: qty 8

## 2019-02-22 MED ORDER — SODIUM CHLORIDE 0.9% FLUSH
3.0000 mL | Freq: Two times a day (BID) | INTRAVENOUS | Status: DC
  Administered 2019-02-22 – 2019-02-24 (×5): 3 mL via INTRAVENOUS

## 2019-02-22 MED ORDER — ONDANSETRON HCL 4 MG PO TABS: 4.0000 mg | ORAL_TABLET | Freq: Four times a day (QID) | ORAL | Status: DC | PRN

## 2019-02-22 MED ORDER — METOPROLOL TARTRATE 25 MG PO TABS
25.0000 mg | ORAL_TABLET | Freq: Two times a day (BID) | ORAL | Status: DC
  Administered 2019-02-22 – 2019-02-24 (×4): 25 mg via ORAL
  Filled 2019-02-22 (×4): qty 1

## 2019-02-22 MED ORDER — FUROSEMIDE 10 MG/ML IJ SOLN
60.0000 mg | Freq: Two times a day (BID) | INTRAMUSCULAR | Status: DC
  Administered 2019-02-22 – 2019-02-24 (×4): 60 mg via INTRAVENOUS
  Filled 2019-02-22 (×4): qty 6

## 2019-02-22 MED ORDER — FUROSEMIDE 10 MG/ML IJ SOLN: 80.0000 mg | Freq: Once | INTRAMUSCULAR | Status: DC

## 2019-02-22 MED ORDER — ALBUTEROL SULFATE (2.5 MG/3ML) 0.083% IN NEBU: 2.5000 mg | INHALATION_SOLUTION | RESPIRATORY_TRACT | Status: DC | PRN

## 2019-02-22 MED ORDER — ALPRAZOLAM 0.5 MG PO TABS: 0.2500 mg | ORAL_TABLET | Freq: Every evening | ORAL | Status: DC | PRN

## 2019-02-22 MED ORDER — INSULIN DETEMIR 100 UNIT/ML ~~LOC~~ SOLN
72.0000 [IU] | Freq: Every day | SUBCUTANEOUS | Status: DC
  Administered 2019-02-23: 72 [IU] via SUBCUTANEOUS
  Filled 2019-02-22 (×4): qty 0.72

## 2019-02-22 MED ORDER — SODIUM CHLORIDE 0.9% FLUSH: 3.0000 mL | INTRAVENOUS | Status: DC | PRN

## 2019-02-22 MED ORDER — LABETALOL HCL 5 MG/ML IV SOLN: 10.0000 mg | INTRAVENOUS | Status: DC | PRN

## 2019-02-22 MED ORDER — ONDANSETRON HCL 4 MG/2ML IJ SOLN: 4.0000 mg | Freq: Four times a day (QID) | INTRAMUSCULAR | Status: DC | PRN

## 2019-02-22 MED ORDER — FENOFIBRATE 160 MG PO TABS
160.0000 mg | ORAL_TABLET | Freq: Every day | ORAL | Status: DC
  Filled 2019-02-22 (×2): qty 1

## 2019-02-22 NOTE — Progress Notes (Signed)
*  PRELIMINARY RESULTS* Echocardiogram 2D Echocardiogram has been performed.  Leavy Cella 02/22/2019, 2:56 PM

## 2019-02-22 NOTE — ED Provider Notes (Signed)
[facility] EMERGENCY DEPARTMENT Provider Note   CSN: [id number] Arrival date & time: 02/22/19  7371    History   Chief Complaint Chief Complaint  Patient presents with  . Shortness of Breath    HPI Eugene [name] is a 63 y.o. male is ending with suspected exacerbation of his CHF.  He describes a one-week history of worsening shortness of breath along with a 9 to 10 pound weight gain, 3 pounds over the past 24 hours along with orthopnea since last night, and peripheral edema.  He denies chest pain, fevers or chills.  He does endorse a chronic dry cough which he states he has had since his last MI and is not different in any way.  He takes torsemide for his CHF and has increased his 20 mg dose to 30 mg of been 40 mg every other day this week with worsening fluid retention.  He denies any other changes in his medications, no significant dietary changes.  He does endorse a little bit of wheezing yesterday evening which is resolved today.     The history is provided by the patient.    Past Medical History:  Diagnosis Date  . Anemia   . Anxiety   . Arteriosclerotic cardiovascular disease (ASCVD)    a. 05/2011 s/p DES to PDA and RCA. b. 08/2012 Inflat  STEMI/Cath/PCI: LM minor irregs, LAD 50p, D1 50, LCX nl, OM1 25, RCA 30-40p, 100d (treated with 2.75x49mm Promus Premier DES);    [name] Asthma   . Bell palsy   . C. difficile colitis    a. 08/2012  . Cholelithiasis 07/2012   Asymptomatic; identified incidentally  . Chronic diastolic CHF (congestive heart failure) ([place])   . Contrast dye induced nephropathy    a. 08/2012 post cath/pci  . COPD (chronic obstructive pulmonary disease) ([place])   . Diabetes mellitus    Peripheral neuropathy  . Gallstones   . GERD (gastroesophageal reflux disease)   . Hyperlipidemia   . Hypertension   . Myocardial infarct ([place]) 09/08/12  . Nephrolithiasis   . Old myocardial infarction   . Pericardial effusion    a. s/p window in 2014.  [name] PONV  (postoperative nausea and vomiting)   . Sleep apnea     Patient Active Problem List   Diagnosis Date Noted  . Acute exacerbation of CHF (congestive heart failure) ([place]) 02/22/2019  . S/P pericardial window creation 10/01/2018  . DM type 2 causing CKD stage 3 ([place]) 11/14/2016  . Acute on chronic systolic congestive heart failure ([place])   . Cardiomyopathy ([place])   . Acute on chronic diastolic CHF (congestive heart failure) ([place]) 11/10/2016  . Elevated troponin   . Coronary artery disease involving native coronary artery of native heart without angina pectoris   . Moderate nonproliferative diabetic retinopathy of both eyes without macular edema associated with type 2 diabetes mellitus ([place]) 10/22/2016  . Hyperkalemia 06/14/2016  . Non-ST elevation myocardial infarction (NSTEMI) ([place])   . Morbid obesity ([place])   . Diabetes mellitus type 2, uncontrolled, with complications ([place]) 02/20/9484  . Right carotid bruit 11/10/2015  . Severe hypertension 11/09/2015  . Hypertensive urgency 11/09/2015  . Gout of wrist 08/08/2015  . Pericardial effusion without cardiac tamponade 05/26/2015  . HLD (hyperlipidemia) 05/26/2015  . Anxiety 05/26/2015  . Olecranon bursitis   . Inflammatory arthritis   . Elbow joint pain 01/21/2015  . SOB (shortness of breath) on exertion 12/26/2014  . Constipation 10/26/2014  . Peripheral edema 01/29/2014  .  Acute on chronic diastolic heart failure ([place]) 01/26/2014  . Chest pain 12/13/2013  . SOB (shortness of breath) 10/16/2013  . Chronic diastolic CHF (congestive heart failure) ([place]) 10/15/2013  . NSVT (nonsustained ventricular tachycardia) ([place]) 06/26/2013  . Type 2 diabetes mellitus with neurological manifestations ([place]) 06/26/2013  . Acute diastolic CHF (congestive heart failure) ([place]) 06/26/2013  . Pericardial effusion 06/26/2013  . Old myocardial infarction   . Gastroparesis due to DM ([place]) 05/21/2013  . CAD (coronary artery disease) 09/16/2012  . Acute on  chronic renal insufficiency 09/14/2012  . Edema 08/18/2012  . Anemia, normocytic normochromic 07/31/2012  . Cholelithiasis 07/27/2012  . GERD (gastroesophageal reflux disease) 05/10/2012  . Noncompliance 05/09/2012  . Peripheral neuropathy 10/24/2011  . Obstructive sleep apnea- on C-pap 08/23/2011  . CAD- MI/RCA PCI-DES x2 2012, and RCA DES Jan 2014   . Hyperlipidemia   . Cholelithiasis 05/28/2011  . DM type 2 (diabetes mellitus, type 2) ([place]) 05/25/2011  . Essential hypertension 05/25/2011    Past Surgical History:  Procedure Laterality Date  . CARDIAC CATHETERIZATION    . CHEST TUBE INSERTION    . CIRCUMCISION    . COLONOSCOPY     In [facility], approximately 2011 per patient, was normal. Advised to come back in 10 years.  . ESOPHAGOGASTRODUODENOSCOPY     in [place] over 20 yrs ago  . ESOPHAGOGASTRODUODENOSCOPY  06/12/2012   DJS:HFWYOV esophagus-status post Venia Minks dilation. Abnormal gastric mucosa of uncertain significance-status post biopsy  . INTRAOPERATIVE TRANSESOPHAGEAL ECHOCARDIOGRAM N/A 06/23/2013   Procedure: INTRAOPERATIVE TRANSESOPHAGEAL ECHOCARDIOGRAM;  Surgeon: Grace Isaac, MD;  Location: [place];  Service: Open Heart Surgery;  Laterality: N/A;  . LEFT HEART CATHETERIZATION WITH CORONARY ANGIOGRAM N/A 09/08/2012   Procedure: LEFT HEART CATHETERIZATION WITH CORONARY ANGIOGRAM;  Surgeon: [name], MD;  Location: [facility] C[facility] LAB;  Service: Cardiovascular;  Laterality: N/A;  . stents    . SUBXYPHOID PERICARDIAL WINDOW N/A 06/23/2013   Procedure: SUBXYPHOID PERICARDIAL WINDOW;  Surgeon: Grace Isaac, MD;  Location: [place];  Service: Thoracic;  Laterality: N/A;  . SUBXYPHOID PERICARDIAL WINDOW  10/01/2018  . SUBXYPHOID PERICARDIAL WINDOW N/A 10/01/2018   Procedure: REDO SUBXYPHOID PERICARDIAL WINDOW;  Surgeon: Grace Isaac, MD;  Location: [place];  Service: Thoracic;  Laterality: N/A;  . TEE WITHOUT CARDIOVERSION N/A 10/01/2018   Procedure:  TRANSESOPHAGEAL ECHOCARDIOGRAM (TEE);  Surgeon: Grace Isaac, MD;  Location: [facility] OR;  Service: Thoracic;  Laterality: N/A;        Home Medications    Prior to Admission medications   Medication Sig Start Date End Date Taking? Authorizing Provider  acetaminophen (TYLENOL) 500 MG tablet Take 1,500 mg by mouth every 6 (six) hours as needed for mild pain, fever or headache.     [provider]  allopurinol (ZYLOPRIM) 100 MG tablet Take one tablet by mouth daily Patient taking differently: Take 100 mg by mouth daily as needed (for gout flare up).  06/19/18   [name], MD  ALPRAZolam Duanne Moron) 0.5 MG tablet TAKE 1/2-1 TABLET BY MOUTH AT BEDTIME AS NEEDED 09/30/18   [name], MD  aspirin 81 MG chewable tablet Chew 81 mg by mouth at bedtime.    [provider]  Cholecalciferol (VITAMIN D3 PO) Take 1 capsule by mouth every other day.    [provider]  DROPLET PEN NEEDLES 32G X 6 MM MISC USE AS DIRECTED 12/25/18   [name], MD  fenofibrate 160 MG tablet TAKE 1 TABLET  AT BEDTIME Patient taking differently: Take 160 mg by mouth at bedtime.  09/19/18   [name], MD  glimepiride (AMARYL) 1 MG tablet Take 1 tablet (1 mg total) by mouth at bedtime. 01/06/19   [name], MD  glucose blood (ACCU-CHEK SMARTVIEW) test strip TEST BLOOD SUGAR ONE TIME DAILY 06/19/18   [name], MD  Insulin Detemir (LEVEMIR FLEXTOUCH) 100 UNIT/ML Pen Inject 80 Units into the skin daily at 10 pm. 02/20/19   [name], MD  Insulin Pen Needle (BD PEN NEEDLE NANO U/F) 32G X 4 MM MISC Use once daily as directed 05/02/17   [name], MD  isosorbide mononitrate (IMDUR) 60 MG 24 hr tablet TAKE 1 TABLET (60 MG TOTAL) BY MOUTH DAILY. 11/10/18   Herminio Commons, MD  losartan (COZAAR) 25 MG tablet  11/14/18   [provider]  metoprolol tartrate (LOPRESSOR) 25 MG tablet TAKE 1 TABLET TWICE DAILY 01/21/19   [name], MD  metroNIDAZOLE (FLAGYL) 500  MG tablet Take 1 tablet (500 mg total) by mouth 3 (three) times daily. 12/12/18   [name], MD  ofloxacin (FLOXIN) 0.3 % OTIC solution PLACE 5 DROPS INTO THE LEFT EAR 2 (TWO) TIMES DAILY. 01/09/19   Mikey Kirschner, MD  rosuvastatin (CRESTOR) 5 MG tablet Take one tablet on Mondays and Fridays 01/05/19   [name], MD  torsemide (DEMADEX) 20 MG tablet Take 1-2 tablets by mouth each morning as directed by physician 01/21/19   [name], MD  traMADol (ULTRAM) 50 MG tablet TAKE 1 TABLET (50 MG TOTAL) BY MOUTH EVERY 6 (SIX) HOURS AS NEEDED 01/22/19   [name], MD    Family History Family History  Problem Relation Age of Onset  . Diabetes Mother   . Heart attack Mother   . Stroke Mother   . Diabetes Sister   . Sleep apnea Sister   . Hypertension Brother   . Diabetes Brother   . Diabetes Brother   . Hypertension Brother   . Colon cancer Neg Hx   . Liver disease Neg Hx     Social History Social History   Tobacco Use  . Smoking status: Never Smoker  . Smokeless tobacco: Never Used  Substance Use Topics  . Alcohol use: No    Alcohol/week: 0.0 standard drinks    Comment: heavy etoh use 30 years ago  . Drug use: No     Allergies   Hydrocodone, Lisinopril, Neurontin [gabapentin], Statins, Metformin and related, and Norvasc [amlodipine besylate]   Review of Systems Review of Systems  Constitutional: Negative for chills and fever.  HENT: Negative for congestion and sore throat.   Eyes: Negative.   Respiratory: Positive for shortness of breath and wheezing. Negative for chest tightness.   Cardiovascular: Positive for leg swelling. Negative for chest pain and palpitations.  Gastrointestinal: Negative for abdominal pain, nausea and vomiting.  Genitourinary: Negative.   Musculoskeletal: Negative for arthralgias, joint swelling and neck pain.  Skin: Negative.  Negative for rash and wound.  Neurological: Negative for dizziness, weakness, light-headedness,  numbness and headaches.  Psychiatric/Behavioral: Negative.      Physical Exam Updated Vital Signs BP (!) 162/82   Pulse 74   Temp 97.9 F (36.6 C) (Oral)   Resp 18   Ht 6\' 1"  (1.854 m)   Wt 119.3 kg   SpO2 93%   BMI 34.70 kg/m   Physical Exam Vitals signs and nursing note reviewed.  Constitutional:      Appearance: He is well-developed.  HENT:     Head: Normocephalic and atraumatic.  Eyes:     Conjunctiva/sclera: Conjunctivae normal.  Neck:     Musculoskeletal: Normal range of motion.  Cardiovascular:     Rate and Rhythm: Normal rate and regular rhythm.     Heart sounds: Normal heart sounds.  Pulmonary:     Effort: Pulmonary effort is normal.     Breath sounds: Examination of the right-lower field reveals decreased breath sounds. Examination of the left-lower field reveals decreased breath sounds. Decreased breath sounds present. No wheezing, rhonchi or rales.  Abdominal:     General: Bowel sounds are normal.     Palpations: Abdomen is soft.     Tenderness: There is no abdominal tenderness.  Musculoskeletal: Normal range of motion.     Right lower leg: Edema present.     Left lower leg: Edema present.  Skin:    General: Skin is warm and dry.  Neurological:     Mental Status: He is alert.      ED Treatments / Results  Labs (all labs ordered are listed, but only abnormal results are displayed) Labs Reviewed  BASIC METABOLIC PANEL - Abnormal; Notable for the following components:      Result Value   Glucose, Bld 124 (*)    BUN 40 (*)    Creatinine, Ser 1.52 (*)    GFR calc non Af Amer 48 (*)    GFR calc Af Amer 56 (*)    All other components within normal limits  BRAIN NATRIURETIC PEPTIDE - Abnormal; Notable for the following components:   B Natriuretic Peptide 344.0 (*)    All other components within normal limits  CBC WITH DIFFERENTIAL/PLATELET - Abnormal; Notable for the following components:   RBC 3.84 (*)    Hemoglobin 11.8 (*)    HCT 36.6 (*)     All other components within normal limits  TROPONIN I (HIGH SENSITIVITY) - Abnormal; Notable for the following components:   Troponin I (High Sensitivity) 46.00 (*)    All other components within normal limits  TROPONIN I (HIGH SENSITIVITY) - Abnormal; Notable for the following components:   Troponin I (High Sensitivity) 41.00 (*)    All other components within normal limits  SARS CORONAVIRUS 2 (HOSPITAL ORDER, [place] LAB)  MAGNESIUM    EKG EKG Interpretation  Date/Time:  Sunday February 22 2019 10:06:52 EDT Ventricular Rate:  79 PR Interval:    QRS Duration: 132 QT Interval:  405 QTC Calculation: 465 R Axis:   -68 Text Interpretation:  Sinus rhythm Borderline prolonged PR interval Right bundle branch block Inferior infarct, old Anterior infarct, old Lateral leads are also involved similar to prior 2/20 Confirmed by Aletta Edouard [phone]) on 02/22/2019 10:15:20 AM   Radiology Dg Chest Portable 1 View  Result Date: 02/22/2019 CLINICAL DATA:  Shortness of breath. Lower extremity swelling. Weight gain. EXAM: PORTABLE CHEST 1 VIEW COMPARISON:  Chest x-rays dated 10/16/2018, 10/02/2018 and 09/29/2018. FINDINGS: New central pulmonary vascular congestion and bilateral interstitial edema. Marked cardiomegaly. No pleural effusion or pneumothorax seen. Osseous structures about the chest are unremarkable. IMPRESSION: 1. New central pulmonary vascular congestion and bilateral interstitial edema indicating CHF/volume overload. 2. Marked cardiomegaly, stable to slightly more prominent than recent chest x-rays. Of note, a moderate to large pericardial effusion was present on chest CT of 09/11/2018 and the slightly more prominent appearance of the heart on today's exam may  indicate an increase in this pericardial effusion. Consider ECHO for further characterization. Electronically Signed   By: Franki Cabot M.D.   On: 02/22/2019 11:55    Procedures Procedures (including critical  care time)  Medications Ordered in ED Medications  metoprolol tartrate (LOPRESSOR) tablet 25 mg (25 mg Oral Not Given 02/22/19 1108)  potassium chloride SA (K-DUR) CR tablet 20 mEq (20 mEq Oral Given 02/22/19 1123)  furosemide (LASIX) 80 mg in dextrose 5 % 50 mL IVPB (0 mg Intravenous Stopped 02/22/19 1232)     Initial Impression / Assessment and Plan / ED Course  I have reviewed the triage vital signs and the nursing notes.  Pertinent labs & imaging results that were available during my care of the patient were reviewed by me and considered in my medical decision making (see chart for details).  Clinical Course as of Feb 21 1249  Sun Feb 22, 6284  6131 63 year old male with history of CHF here with increased shortness of breath peripheral edema and weight gain.  He is tried to increase his diuretic without any improvement.  No fevers.  He is satting okay on room air.  Checking EKG chest x-ray blood work BNP.  We will give some IV diuresis.   [MB]    Clinical Course User Index [MB] Hayden Rasmussen, MD      Heart cath 1/14: Final Conclusions:   1. Acute inferior MI secondary to total occlusion of the distal RCA, treated successfully with primary PCI 2. Diffuse distal vessel disease of the LAD and moderate proximal LAD stenosis 3. Minor nonobstructive LCx stenosis 4. Moderate segmental LV dysfunction with an LVEF of 45% and inferior wall hypokinesis   Pericardial window last completed (Dr. Servando Snare) 2/20 revision.   Pt with acute CHF clinically, sob, weight gain, no CP.  Troponin positive, will need delta troponin at 2 hour mark.  Pt was given Lasix to start diuresis here. CXR with cardiomegaly, echo recommended.      Discussed with Dr. Denton Brick who accepts pt for admission.  May send to [facility] given need for echocardiogram.  Pending 2 hour troponin. 2nd troponin returned and stable.  Suspect chronically at this level due to CHF.   Final Clinical Impressions(s) / ED Diagnoses    Final diagnoses:  Acute on chronic combined systolic and diastolic congestive heart failure [facility])    ED Discharge Orders    None       Landis Martins 02/22/19 1352    Hayden Rasmussen, MD 02/23/19 [phone]

## 2019-02-22 NOTE — Progress Notes (Signed)
Pt desats to 70s w/ sleep. Had refused CPAP earlier w/ RT. Placed on 2L [place], currently at 99%. Will continue to monitor.  Jaymes Graff, RN

## 2019-02-22 NOTE — ED Triage Notes (Signed)
Patient c/o shortness of breath with swelling in lower extremities and weight gain of 9-10lbs in 1 week (3lbs since yesterday). Patient has hx of COPD and CHF. Per patient has tried to limit sodium and took 40mg  of Lasix instead of his normal 20mg  in attempt to manage fluid gain at home with no relief. Denies any chest pain.

## 2019-02-22 NOTE — H&P (Signed)
Patient Demographics:    Eugene [name], is a 63 y.o. male  MRN: [id number]   DOB - 1955/12/15  Admit Date - 02/22/2019  Outpatient Primary MD for the patient is [name], MD   Assessment & Plan:    Principal Problem:   Acute on chronic diastolic CHF (congestive heart failure) ([place]) Active Problems:   CAD- MI/RCA PCI-DES x2 2012, and RCA DES Jan 2014   Recurrent Pericardial effusion/pericardial window on 06/23/2013 and 10/01/2018   DM type 2 (diabetes mellitus, type 2) ([place])   Essential hypertension   Obstructive sleep apnea- on C-pap   CAD (coronary artery disease)   Morbid obesity ([place])   S/P pericardial window creation   -Creatinine is 1.52 which is actually lower than patient's baseline which is typically between 1.8 and 2    1)HFpEF/Acute on chronic diastolic CHF Exacerbation---  initial troponin was 46, repeat troponin 41, EKG not consistent with ACS, no leukocytosis, BNP is 344 which is close to his previous levels, last known EF 50 to 55% 2019, repeat echo on 02/22/2019 pending----patient admitted with orthopnea, dyspnea at rest and with exertion, increased lower extremity edema, 10 pound weight gain over the last week with 3 pounds of that coming over the last 24 hours despite increasing torsemide from 20 mg daily to 40 mg daily weight gain CHF symptoms persisted----clinical exam and chest x-ray as well as lab data confirms CHF exacerbation--- patient voided about 1 L of urine within an hour of receiving Lasix 80 mg x 1 in the ED, given Nitropaste x1 dose, continue IV Lasix at 60 mg every 12 hours,--- Daily weights, fluid input and output monitoring, cardiology consult and input requested.    2)Recurrent Pericardial Effusion-----status post prior pericardial window by Dr. Roxy Horseman on 06/23/2013 and  10/01/2018--- patient symptomatic at this time, chest x-ray suggesting possible pericardial effusion, echocardiogram pending on 02/22/2019,----if Large pericardial effusion is confirmed patient will need cardiothoracic input/consult   3)H/o CAD--- status post prior MI, status post prior angioplasty and stent placement in 2012 and 2014 --- currently chest pain-free, LDL was 125 and HDL was 31 on 01/01/2019, patient apparently is intolerant to statins, continue fenofibrate,, may benefit from Repatha --- elevated but flat troponins noted, not consistent with ACS, continue aspirin, isosorbide/ Nitropaste, metoprolol 25 twice daily,   4)DM2-uncontrolled, last A1c 8.2 on 01/01/2019,, give Levemir 72 units every evening, Use Novolog/Humalog Sliding scale insulin with Accu-Cheks/Fingersticks as ordered   5)HTN--- initially blood pressure was 181/92, improved after IV Lasix okay to restart Losartan 25 mg daily, Imdur 30 mg daily, metoprolol 25 mg twice daily,  may use IV labetalol when necessary  Every 4 hours for systolic blood pressure over 160 mmhg  6) obesity and OSA--- CPAP nightly  7)CKD III-  -Creatinine is 1.52 which is actually lower than patient's baseline which is typically between 1.8 and 2, suspect due to volume overload in the setting of CHF and hemodilution, --- renally  adjust medications, avoid nephrotoxic agents/dehydration/hypotension   With History of - Reviewed by me  Past Medical History:  Diagnosis Date  . Anemia   . Anxiety   . Arteriosclerotic cardiovascular disease (ASCVD)    a. 05/2011 s/p DES to PDA and RCA. b. 08/2012 Inflat  STEMI/Cath/PCI: LM minor irregs, LAD 50p, D1 50, LCX nl, OM1 25, RCA 30-40p, 100d (treated with 2.75x76mm Promus Premier DES);    [name] Asthma   . Bell palsy   . C. difficile colitis    a. 08/2012  . Cholelithiasis 07/2012   Asymptomatic; identified incidentally  . Chronic diastolic CHF (congestive heart failure) ([place])   . Contrast dye induced nephropathy     a. 08/2012 post cath/pci  . COPD (chronic obstructive pulmonary disease) ([place])   . Diabetes mellitus    Peripheral neuropathy  . Gallstones   . GERD (gastroesophageal reflux disease)   . Hyperlipidemia   . Hypertension   . Myocardial infarct ([place]) 09/08/12  . Nephrolithiasis   . Old myocardial infarction   . Pericardial effusion    a. s/p window in 2014.  [name] PONV (postoperative nausea and vomiting)   . Sleep apnea       Past Surgical History:  Procedure Laterality Date  . CARDIAC CATHETERIZATION    . CHEST TUBE INSERTION    . CIRCUMCISION    . COLONOSCOPY     In [place], approximately 2011 per patient, was normal. Advised to come back in 10 years.  . ESOPHAGOGASTRODUODENOSCOPY     in [place] over 20 yrs ago  . ESOPHAGOGASTRODUODENOSCOPY  06/12/2012   QMG:QQPYPP esophagus-status post Venia Minks dilation. Abnormal gastric mucosa of uncertain significance-status post biopsy  . INTRAOPERATIVE TRANSESOPHAGEAL ECHOCARDIOGRAM N/A 06/23/2013   Procedure: INTRAOPERATIVE TRANSESOPHAGEAL ECHOCARDIOGRAM;  Surgeon: Grace Isaac, MD;  Location: [facility];  Service: Open Heart Surgery;  Laterality: N/A;  . LEFT HEART CATHETERIZATION WITH CORONARY ANGIOGRAM N/A 09/08/2012   Procedure: LEFT HEART CATHETERIZATION WITH CORONARY ANGIOGRAM;  Surgeon: [name], MD;  Location: [facility] CATH LAB;  Service: Cardiovascular;  Laterality: N/A;  . stents    . SUBXYPHOID PERICARDIAL WINDOW N/A 06/23/2013   Procedure: SUBXYPHOID PERICARDIAL WINDOW;  Surgeon: Grace Isaac, MD;  Location: [facility];  Service: Thoracic;  Laterality: N/A;  . SUBXYPHOID PERICARDIAL WINDOW  10/01/2018  . SUBXYPHOID PERICARDIAL WINDOW N/A 10/01/2018   Procedure: REDO SUBXYPHOID PERICARDIAL WINDOW;  Surgeon: Grace Isaac, MD;  Location: [facility];  Service: Thoracic;  Laterality: N/A;  . TEE WITHOUT CARDIOVERSION N/A 10/01/2018   Procedure: TRANSESOPHAGEAL ECHOCARDIOGRAM (TEE);  Surgeon: Grace Isaac, MD;  Location:  [facility];  Service: Thoracic;  Laterality: N/A;     Chief Complaint  Patient presents with  . Shortness of Breath      HPI:    Eugene [name]  is a 63 y.o. male  With Past medical history relevant for recurrent pericardial effusion, status post prior subxiphoid pericardial window in 2014 and a redo on 10/01/2018, as well as history of coronary artery disease with previous MI and angioplasty and stenting, history of uncontrolled diabetes, with peripheral neuropathy, HLD, dCHF (EF 50 to 55%) presents with a 1-week history of a 10 pound weight gain, 3 pounds of that coming in the  last 24 hours-despite increasing torsemide from 20 mg daily to 40 mg daily weight gain CHF symptoms persisted, orthopnea, increasing lower extremity edema, dyspnea at rest and dyspnea on exertion, worsening peripheral edema No chest pains, no leg pains, no pleuritic  symptoms  No fevers, no chills, no productive cough, patient has a chronic dry cough  In ED-  initial troponin was 46, repeat troponin 41, EKG not consistent with ACS, no leukocytosis, BNP is 344 which is close to his previous levels  -Creatinine is 1.52 which is actually lower than patient's baseline which is typically between 1.8 and 2  In ED- Chest x-ray in ED suggest CHF with marked cardiomegaly and a moderate to large pericardial effusion was present on chest CT of 09/11/2018 and the slightly more prominent appearance of the heart on today's exam may indicate an increase in this pericardial effusion. Consider ECHO for further characterization.  --in ED Patient received IV Lasix 80 mg x 1 he voided about 1 L of urine and felt somewhat better  Coronavirus testing negative  he will be admitted to [facility] complex due to concerns about possible large pericardial effusion and need for cardiothoracic surgery intervention/input   Review of systems:    In addition to the HPI above,   A full Review of  Systems was done, all other systems reviewed are  negative except as noted above in HPI , .    Social History:  Reviewed by me    Social History   Tobacco Use  . Smoking status: Never Smoker  . Smokeless tobacco: Never Used  Substance Use Topics  . Alcohol use: No    Alcohol/week: 0.0 standard drinks    Comment: heavy etoh use 30 years ago       Family History :  Reviewed by me    Family History  Problem Relation Age of Onset  . Diabetes Mother   . Heart attack Mother   . Stroke Mother   . Diabetes Sister   . Sleep apnea Sister   . Hypertension Brother   . Diabetes Brother   . Diabetes Brother   . Hypertension Brother   . Colon cancer Neg Hx   . Liver disease Neg Hx     Home Medications:   Prior to Admission medications   Medication Sig Start Date End Date Taking? Authorizing Provider  acetaminophen (TYLENOL) 500 MG tablet Take 1,500 mg by mouth every 6 (six) hours as needed for mild pain, fever or headache.    Yes [provider]  allopurinol (ZYLOPRIM) 100 MG tablet Take one tablet by mouth daily Patient taking differently: Take 100 mg by mouth daily as needed (for gout flare up).  06/19/18  Yes Luking, Elayne Snare, MD  ALPRAZolam (XANAX) 0.5 MG tablet TAKE 1/2-1 TABLET BY MOUTH AT BEDTIME AS NEEDED Patient taking differently: Take 0.25-0.5 mg by mouth at bedtime as needed for anxiety.  09/30/18  Yes [name], MD  aspirin 81 MG chewable tablet Chew 81 mg by mouth at bedtime.   Yes [provider]  Insulin Detemir (LEVEMIR FLEXTOUCH) 100 UNIT/ML Pen Inject 80 Units into the skin daily at 10 pm. Patient taking differently: Inject 72 Units into the skin daily at 10 pm.  02/20/19  Yes Luking, Elayne Snare, MD  isosorbide mononitrate (IMDUR) 60 MG 24 hr tablet TAKE 1 TABLET (60 MG TOTAL) BY MOUTH DAILY. 11/10/18  Yes Herminio Commons, MD  losartan (COZAAR) 25 MG tablet Take 25 mg by mouth daily.  11/14/18  Yes [provider]  metoprolol tartrate (LOPRESSOR) 25 MG tablet TAKE 1 TABLET TWICE  DAILY Patient taking differently: Take 25 mg by mouth 2 (two) times daily.  01/21/19  Yes [name], MD  ofloxacin (FLOXIN) 0.3 % OTIC solution PLACE 5 DROPS INTO THE LEFT EAR 2 (TWO) TIMES DAILY. Patient taking differently: Place 5 drops into the left ear daily as needed.  01/09/19  Yes Mikey Kirschner, MD  torsemide (DEMADEX) 20 MG tablet Take 1-2 tablets by mouth each morning as directed by physician Patient taking differently: Take 20-30 mg by mouth See admin instructions. Alternates daily taking 20mg  one day and 30 mg the next. 01/21/19  Yes Luking, Elayne Snare, MD  traMADol (ULTRAM) 50 MG tablet TAKE 1 TABLET (50 MG TOTAL) BY MOUTH EVERY 6 (SIX) HOURS AS NEEDED Patient taking differently: Take 50 mg by mouth daily as needed.  01/22/19  Yes [name], MD  Cholecalciferol (VITAMIN D3 PO) Take 1 capsule by mouth every other day.    [provider]  fenofibrate 160 MG tablet TAKE 1 TABLET AT BEDTIME Patient not taking: No sig reported 09/19/18   [name], MD  glimepiride (AMARYL) 1 MG tablet Take 1 tablet (1 mg total) by mouth at bedtime. Patient not taking: Reported on 02/22/2019 01/06/19   [name], MD  glucose blood (ACCU-CHEK SMARTVIEW) test strip TEST BLOOD SUGAR ONE TIME DAILY Patient not taking: Reported on 02/22/2019 06/19/18   [name], MD  Insulin Pen Needle (BD PEN NEEDLE NANO U/F) 32G X 4 MM MISC Use once daily as directed Patient not taking: Reported on 02/22/2019 05/02/17   [name], MD  metroNIDAZOLE (FLAGYL) 500 MG tablet Take 1 tablet (500 mg total) by mouth 3 (three) times daily. Patient not taking: Reported on 02/22/2019 12/12/18   [name], MD  rosuvastatin (CRESTOR) 5 MG tablet Take one tablet on Mondays and Fridays Patient not taking: Reported on 02/22/2019 01/05/19   [name], MD     Allergies:     Allergies  Allergen Reactions  . Hydrocodone Nausea And Vomiting  . Lisinopril Cough  . Neurontin [Gabapentin] Other  (See Comments)    Reaction:  Suicidal thoughts   . Statins Other (See Comments)    Reaction:  Muscle pain   . Metformin And Related Diarrhea  . Norvasc [Amlodipine Besylate] Swelling and Other (See Comments)    Reaction:  Pedal edema     Physical Exam:   Vitals  Blood pressure 107/80, pulse 74, temperature 97.9 F (36.6 C), temperature source Oral, resp. rate 18, height 6\' 1"  (1.854 m), weight 119.3 kg, SpO2 93 %.  Physical Examination: General appearance - alert, obese appearing, and with conversational dyspnea initially  mental status - alert, oriented to person, place, and time,  Eyes - sclera anicteric Neck - supple, +ve JVD elevation , Chest -diminished in bases with faint bibasilar rales Heart - S1 and S2 normal, regular ,Xiphoid area scar from prior pericardial window Abdomen - soft, nontender, nondistended, increased truncal adiposity neurological - screening mental status exam normal, neck supple without rigidity, cranial nerves II through XII intact, DTR's normal and symmetric Extremities -2-3+  Pitting pedal edema noted, intact peripheral pulses Skin - warm, dry     Data Review:    CBC Recent Labs  Lab 02/22/19 1020  WBC 5.5  HGB 11.8*  HCT 36.6*  PLT 164  MCV 95.3  MCH 30.7  MCHC 32.2  RDW 14.4  LYMPHSABS 1.1  MONOABS 0.5  EOSABS 0.1  BASOSABS 0.0   ------------------------------------------------------------------------------------------------------------------  Chemistries  Recent Labs  Lab 02/22/19 1020  NA 138  K 4.6  CL 101  CO2 26  GLUCOSE 124*  BUN 40*  CREATININE 1.52*  CALCIUM 9.3  MG 1.9   ------------------------------------------------------------------------------------------------------------------ estimated creatinine clearance is 68.2 mL/min (A) (by C-G formula based on SCr of 1.52 mg/dL (H)). ------------------------------------------------------------------------------------------------------------------ No results for  input(s): TSH, T4TOTAL, T3FREE, THYROIDAB in the last 72 hours.  Invalid input(s): FREET3   Coagulation profile No results for input(s): INR, PROTIME in the last 168 hours. ------------------------------------------------------------------------------------------------------------------- No results for input(s): DDIMER in the last 72 hours. -------------------------------------------------------------------------------------------------------------------  Cardiac Enzymes No results for input(s): CKMB, TROPONINI, MYOGLOBIN in the last 168 hours.  Invalid input(s): CK ------------------------------------------------------------------------------------------------------------------    Component Value Date/Time   BNP 344.0 (H) 02/22/2019 1020   BNP 12.4 07/29/2012 1040     ---------------------------------------------------------------------------------------------------------------  Urinalysis    Component Value Date/Time   COLORURINE YELLOW 09/29/2018 1451   APPEARANCEUR CLEAR 09/29/2018 1451   LABSPEC 1.010 09/29/2018 1451   PHURINE 5.0 09/29/2018 1451   GLUCOSEU 150 (A) 09/29/2018 1451   HGBUR SMALL (A) 09/29/2018 1451   BILIRUBINUR NEGATIVE 09/29/2018 1451   KETONESUR NEGATIVE 09/29/2018 1451   PROTEINUR 100 (A) 09/29/2018 1451   UROBILINOGEN 0.2 01/21/2015 1053   NITRITE NEGATIVE 09/29/2018 1451   LEUKOCYTESUR NEGATIVE 09/29/2018 1451    ----------------------------------------------------------------------------------------------------------------   Imaging Results:    Dg Chest Portable 1 View  Result Date: 02/22/2019 CLINICAL DATA:  Shortness of breath. Lower extremity swelling. Weight gain. EXAM: PORTABLE CHEST 1 VIEW COMPARISON:  Chest x-rays dated 10/16/2018, 10/02/2018 and 09/29/2018. FINDINGS: New central pulmonary vascular congestion and bilateral interstitial edema. Marked cardiomegaly. No pleural effusion or pneumothorax seen. Osseous structures about the  chest are unremarkable. IMPRESSION: 1. New central pulmonary vascular congestion and bilateral interstitial edema indicating CHF/volume overload. 2. Marked cardiomegaly, stable to slightly more prominent than recent chest x-rays. Of note, a moderate to large pericardial effusion was present on chest CT of 09/11/2018 and the slightly more prominent appearance of the heart on today's exam may indicate an increase in this pericardial effusion. Consider ECHO for further characterization. Electronically Signed   By: Franki Cabot M.D.   On: 02/22/2019 11:55    Radiological Exams on Admission: Dg Chest Portable 1 View  Result Date: 02/22/2019 CLINICAL DATA:  Shortness of breath. Lower extremity swelling. Weight gain. EXAM: PORTABLE CHEST 1 VIEW COMPARISON:  Chest x-rays dated 10/16/2018, 10/02/2018 and 09/29/2018. FINDINGS: New central pulmonary vascular congestion and bilateral interstitial edema. Marked cardiomegaly. No pleural effusion or pneumothorax seen. Osseous structures about the chest are unremarkable. IMPRESSION: 1. New central pulmonary vascular congestion and bilateral interstitial edema indicating CHF/volume overload. 2. Marked cardiomegaly, stable to slightly more prominent than recent chest x-rays. Of note, a moderate to large pericardial effusion was present on chest CT of 09/11/2018 and the slightly more prominent appearance of the heart on today's exam may indicate an increase in this pericardial effusion. Consider ECHO for further characterization. Electronically Signed   By: Franki Cabot M.D.   On: 02/22/2019 11:55    DVT Prophylaxis -SCD  /SQ heparin to start after CT surgery evaluation AM Labs Ordered, also please review Full Orders  Family Communication: Admission, patients condition and plan of care including tests being ordered have been discussed with the patient who indicate understanding and agree with the plan   Code Status - Full Code  Likely DC to  TBD  Condition   fair   Roxan Hockey M.D on 02/22/2019 at 3:20 PM Go to [email or web address] -  for contact info  [facility] - Office  [phone]

## 2019-02-22 NOTE — Progress Notes (Signed)
Patient refused the use of CPAP for the evening. Will continue to monitor patient.

## 2019-02-23 ENCOUNTER — Encounter (HOSPITAL_COMMUNITY): Payer: Self-pay | Admitting: Physician Assistant

## 2019-02-23 ENCOUNTER — Telehealth: Payer: Self-pay | Admitting: Family Medicine

## 2019-02-23 DIAGNOSIS — Z9889 Other specified postprocedural states: Secondary | ICD-10-CM

## 2019-02-23 DIAGNOSIS — I2510 Atherosclerotic heart disease of native coronary artery without angina pectoris: Secondary | ICD-10-CM

## 2019-02-23 DIAGNOSIS — I313 Pericardial effusion (noninflammatory): Secondary | ICD-10-CM

## 2019-02-23 DIAGNOSIS — E0865 Diabetes mellitus due to underlying condition with hyperglycemia: Secondary | ICD-10-CM

## 2019-02-23 DIAGNOSIS — I5033 Acute on chronic diastolic (congestive) heart failure: Secondary | ICD-10-CM

## 2019-02-23 DIAGNOSIS — Z9111 Patient's noncompliance with dietary regimen: Secondary | ICD-10-CM

## 2019-02-23 DIAGNOSIS — I10 Essential (primary) hypertension: Secondary | ICD-10-CM

## 2019-02-23 LAB — CBC
HCT: 33.6 % — ABNORMAL LOW (ref 39.0–52.0)
Hemoglobin: 10.9 g/dL — ABNORMAL LOW (ref 13.0–17.0)
MCH: 30.4 pg (ref 26.0–34.0)
MCHC: 32.4 g/dL (ref 30.0–36.0)
MCV: 93.6 fL (ref 80.0–100.0)
Platelets: 143 10*3/uL — ABNORMAL LOW (ref 150–400)
RBC: 3.59 MIL/uL — ABNORMAL LOW (ref 4.22–5.81)
RDW: 14.3 % (ref 11.5–15.5)
WBC: 6.1 10*3/uL (ref 4.0–10.5)
nRBC: 0 % (ref 0.0–0.2)

## 2019-02-23 LAB — BASIC METABOLIC PANEL
Anion gap: 9 (ref 5–15)
BUN: 38 mg/dL — ABNORMAL HIGH (ref 8–23)
CO2: 28 mmol/L (ref 22–32)
Calcium: 8.9 mg/dL (ref 8.9–10.3)
Chloride: 102 mmol/L (ref 98–111)
Creatinine, Ser: 1.75 mg/dL — ABNORMAL HIGH (ref 0.61–1.24)
GFR calc Af Amer: 47 mL/min — ABNORMAL LOW (ref >60)
GFR calc non Af Amer: 41 mL/min — ABNORMAL LOW (ref >60)
Glucose, Bld: 180 mg/dL — ABNORMAL HIGH (ref 70–99)
Potassium: 4.4 mmol/L (ref 3.5–5.1)
Sodium: 139 mmol/L (ref 135–145)

## 2019-02-23 LAB — GLUCOSE, CAPILLARY
Glucose-Capillary: 178 mg/dL — ABNORMAL HIGH (ref 70–99)
Glucose-Capillary: 239 mg/dL — ABNORMAL HIGH (ref 70–99)
Glucose-Capillary: 275 mg/dL — ABNORMAL HIGH (ref 70–99)
Glucose-Capillary: 369 mg/dL — ABNORMAL HIGH (ref 70–99)

## 2019-02-23 LAB — HIV ANTIBODY (ROUTINE TESTING W REFLEX): HIV Screen 4th Generation wRfx: NONREACTIVE

## 2019-02-23 MED ORDER — ENOXAPARIN SODIUM 40 MG/0.4ML ~~LOC~~ SOLN
40.0000 mg | SUBCUTANEOUS | Status: DC
  Administered 2019-02-23: 40 mg via SUBCUTANEOUS
  Filled 2019-02-23: qty 0.4

## 2019-02-23 NOTE — Progress Notes (Signed)
Pt refusing the use of CPAP.  RT will continue to monitor.

## 2019-02-23 NOTE — Consult Note (Signed)
Cardiology Consultation:   Patient ID: Eugene [name]; [id number]; October 22, 1955   Admit date: 02/22/2019 Date of Consult: 02/23/2019  Primary Care Provider: [name], MD Primary Cardiologist: Kate Sable, MD Primary Electrophysiologist:  None  Chief Complaint: swelling, shortness of breath  Patient Profile:   Eugene [name] is a 63 y.o. male with a hx of CAD (DES to PDA/RCA in 2012, STEMI in 2014 with DES to [facility]), chronic diastolic CHF, pericardial effusion (s/p pericardial window in 2014, redo window in 09/2018 for recurrent effusion), HTN, HLD (intolerant of statins, previously unable to fill Zetia 2/2 financial restrictions), CKD stage III (baseline 2), DM c/b peripheral enuropathy, OSA (does not use CPAP), GERD, COPD, chronic-appearing anemia who is being seen today for the evaluation of CHF and pericardial effusion at the request of Dr. Denton Brick.  History of Present Illness:   The patient has prior CAD hx as noted above with last nuclear stress test in 10/2016 with large scar, minor peri-infarct ischemia, EF 45% - managed medically. He did develop recurrent pericardial effusion and Dr. Bronson Ing ordered chest CT 08/2018 which showed moderate to large pericardial effusion increased since prior CT. He was referred to CVTS and underwent redo subxiphoid window in 09/2018. The cytopathology read, "PERICARDIAL FLUID (SPECIMEN 1 OF 1 COLLECTED 10-01-2018). REACTIVE APPEARING MESOTHELIAL CELLS. COMMENT: THE MESOTHELIAL CELLS ARE POSITIVE FOR CYTOKERATIN 5/6 AND WT-1 AND FOCALLY POSITIVE FOR CALRETININ. THEY ARE NEGATIVE FOR MOC-31." The pericardial sac biopsy was negative for malignancy. He saw TCTS back but cancelled f/u with cardiology in 11/2018.  He has overall been doing well except the last 2 weeks has been snacking on canned beans and green beans. He noticed a 10lb weight gain at home along with dyspnea and swelling over the course of a week. He tried to self-adjust torsemide  up to 40mg  daily but symptoms persisted so he came to the ER for evaluation. Labs show BNP 344, troponins 46->41, Hgb 11.8->10.9, Cr 1.52->1.75, Covid-19 negative. CXR showed new central vascular congestion/bilateral interstitial edema indicating CHF/volume overload, marked cardiomegaly concerning for recurrent effusion. 2D Echo 02/22/19 showed EF 50%, restrictive filling with elevated LVEDP, normal RV function, moderate pericardial effusion (circumferential), mild-moderate aortic annular calcification, dilated IVC. He was started on IV Lasix 60mg  BID with good UOP and improvement in sx thus far. He says he feels so good he could walk home now. Weight is still up about 5lb above baseline with edema. Denies any CP or palpitations. Worked in [place] a long time ago which had asbestos.   Past Medical History:  Diagnosis Date   Anemia    Anxiety    Arteriosclerotic cardiovascular disease (ASCVD)    a. 05/2011 s/p DES to PDA and RCA. b. 08/2012 Inflat  STEMI/Cath/PCI: LM minor irregs, LAD 50p, D1 50, LCX nl, OM1 25, RCA 30-40p, 100d (treated with 2.75x5mm Promus Premier DES);     Asthma    Bell palsy    C. difficile colitis    a. 08/2012   Cholelithiasis 07/2012   Asymptomatic; identified incidentally   Chronic diastolic CHF (congestive heart failure) (HCC)    CKD (chronic kidney disease), stage III (HCC)    Contrast dye induced nephropathy    a. 08/2012 post cath/pci   COPD (chronic obstructive pulmonary disease) (HCC)    Diabetes mellitus    Peripheral neuropathy   Gallstones    GERD (gastroesophageal reflux disease)    Hyperlipidemia    Hypertension    Myocardial infarct ([place]) 09/08/12  Nephrolithiasis    Old myocardial infarction    Pericardial effusion    a. s/p window in 2014. b. s/p redo window in 09/2018.   Peripheral neuropathy    PONV (postoperative nausea and vomiting)    Sleep apnea    Statin intolerance     Past Surgical History:  Procedure  Laterality Date   CARDIAC CATHETERIZATION     CHEST TUBE INSERTION     CIRCUMCISION     COLONOSCOPY     In [facility], approximately 2011 per patient, was normal. Advised to come back in 10 years.   ESOPHAGOGASTRODUODENOSCOPY     [place] over 20 yrs ago   ESOPHAGOGASTRODUODENOSCOPY  06/12/2012   [id number]:VOJJKK esophagus-status post [facility] dilation. Abnormal gastric mucosa of uncertain significance-status post biopsy   INTRAOPERATIVE TRANSESOPHAGEAL ECHOCARDIOGRAM N/A 06/23/2013   Procedure: INTRAOPERATIVE TRANSESOPHAGEAL ECHOCARDIOGRAM;  Surgeon: Grace Isaac, MD;  Location: [place];  Service: Open Heart Surgery;  Laterality: N/A;   LEFT HEART CATHETERIZATION WITH CORONARY ANGIOGRAM N/A 09/08/2012   Procedure: LEFT HEART CATHETERIZATION WITH CORONARY ANGIOGRAM;  Surgeon: [name], MD;  Location: [facility] CATH LAB;  Service: Cardiovascular;  Laterality: N/A;   stents     SUBXYPHOID PERICARDIAL WINDOW N/A 06/23/2013   Procedure: SUBXYPHOID PERICARDIAL WINDOW;  Surgeon: Grace Isaac, MD;  Location: [place];  Service: Thoracic;  Laterality: N/A;   SUBXYPHOID PERICARDIAL WINDOW  10/01/2018   SUBXYPHOID PERICARDIAL WINDOW N/A 10/01/2018   Procedure: REDO SUBXYPHOID PERICARDIAL WINDOW;  Surgeon: Grace Isaac, MD;  Location: [place];  Service: Thoracic;  Laterality: N/A;   TEE WITHOUT CARDIOVERSION N/A 10/01/2018   Procedure: TRANSESOPHAGEAL ECHOCARDIOGRAM (TEE);  Surgeon: Grace Isaac, MD;  [facility] OR;  Service: Thoracic;  Laterality: N/A;     Inpatient Medications: Scheduled Meds:  aspirin  81 mg Oral QHS   fenofibrate  160 mg Oral QHS   furosemide  60 mg Intravenous Q12H   insulin aspart  0-20 Units Subcutaneous TID WC   insulin aspart  0-5 Units Subcutaneous QHS   insulin detemir  72 Units Subcutaneous Q2200   isosorbide mononitrate  60 mg Oral Daily   losartan  25 mg Oral Daily   metoprolol tartrate  25 mg Oral Once   metoprolol  tartrate  25 mg Oral BID   sodium chloride flush  3 mL Intravenous Q12H   Continuous Infusions:  sodium chloride     PRN Meds: sodium chloride, acetaminophen **OR** acetaminophen, albuterol, ALPRAZolam, labetalol, ofloxacin, ondansetron **OR** ondansetron (ZOFRAN) IV, polyethylene glycol, sodium chloride flush, traZODone  Home Meds: Prior to Admission medications   Medication Sig Start Date End Date Taking? Authorizing Provider  acetaminophen (TYLENOL) 500 MG tablet Take 1,500 mg by mouth every 6 (six) hours as needed for mild pain, fever or headache.    Yes [provider]  allopurinol (ZYLOPRIM) 100 MG tablet Take one tablet by mouth daily Patient taking differently: Take 100 mg by mouth daily as needed (for gout flare up).  06/19/18  Yes Luking, Elayne Snare, MD  ALPRAZolam (XANAX) 0.5 MG tablet TAKE 1/2-1 TABLET BY MOUTH AT BEDTIME AS NEEDED Patient taking differently: Take 0.25-0.5 mg by mouth at bedtime as needed for anxiety.  09/30/18  Yes [name], MD  aspirin 81 MG chewable tablet Chew 81 mg by mouth at bedtime.   Yes [provider]  Insulin Detemir (LEVEMIR FLEXTOUCH) 100 UNIT/ML Pen Inject 80 Units into the skin daily at 10 pm. Patient taking differently: Inject  72 Units into the skin daily at 10 pm.  02/20/19  Yes Luking, Elayne Snare, MD  isosorbide mononitrate (IMDUR) 60 MG 24 hr tablet TAKE 1 TABLET (60 MG TOTAL) BY MOUTH DAILY. 11/10/18  Yes Herminio Commons, MD  losartan (COZAAR) 25 MG tablet Take 25 mg by mouth daily.  11/14/18  Yes [provider]  metoprolol tartrate (LOPRESSOR) 25 MG tablet TAKE 1 TABLET TWICE DAILY Patient taking differently: Take 25 mg by mouth 2 (two) times daily.  01/21/19  Yes Luking, Elayne Snare, MD  ofloxacin (FLOXIN) 0.3 % OTIC solution PLACE 5 DROPS INTO THE LEFT EAR 2 (TWO) TIMES DAILY. Patient taking differently: Place 5 drops into the left ear daily as needed.  01/09/19  Yes Mikey Kirschner, MD  torsemide (DEMADEX) 20  MG tablet Take 1-2 tablets by mouth each morning as directed by physician Patient taking differently: Take 20-30 mg by mouth See admin instructions. Alternates daily taking 20mg  one day and 30 mg the next. 01/21/19  Yes Luking, Elayne Snare, MD  traMADol (ULTRAM) 50 MG tablet TAKE 1 TABLET (50 MG TOTAL) BY MOUTH EVERY 6 (SIX) HOURS AS NEEDED Patient taking differently: Take 50 mg by mouth daily as needed.  01/22/19  Yes [name], MD  Cholecalciferol (VITAMIN D3 PO) Take 1 capsule by mouth every other day.    [provider]  fenofibrate 160 MG tablet TAKE 1 TABLET AT BEDTIME Patient not taking: No sig reported 09/19/18   [name], MD  glimepiride (AMARYL) 1 MG tablet Take 1 tablet (1 mg total) by mouth at bedtime. Patient not taking: Reported on 02/22/2019 01/06/19   [name], MD  glucose blood (ACCU-CHEK SMARTVIEW) test strip TEST BLOOD SUGAR ONE TIME DAILY Patient not taking: Reported on 02/22/2019 06/19/18   [name], MD  Insulin Pen Needle (BD PEN NEEDLE NANO U/F) 32G X 4 MM MISC Use once daily as directed Patient not taking: Reported on 02/22/2019 05/02/17   [name], MD  metroNIDAZOLE (FLAGYL) 500 MG tablet Take 1 tablet (500 mg total) by mouth 3 (three) times daily. Patient not taking: Reported on 02/22/2019 12/12/18   [name], MD  rosuvastatin (CRESTOR) 5 MG tablet Take one tablet on Mondays and Fridays Patient not taking: Reported on 02/22/2019 01/05/19   [name], MD    Allergies:    Allergies  Allergen Reactions   Hydrocodone Nausea And Vomiting   Lisinopril Cough   Neurontin [Gabapentin] Other (See Comments)    Reaction:  Suicidal thoughts    Statins Other (See Comments)    Reaction:  Muscle pain    Metformin And Related Diarrhea   Norvasc [Amlodipine Besylate] Swelling and Other (See Comments)    Reaction:  Pedal edema    Social History:   Social History   Socioeconomic History   Marital status: Married    Spouse name:  Not on file   Number of children: 2   Years of education: Not on file   Highest education level: Not on file  Occupational History   Occupation: Personal assistant: [employer]    Comment: Quarry manager strain: Not on file   Food insecurity    Worry: Not on file    Inability: Not on Lexicographer needs    Medical: Not on file    Non-medical: Not on file  Tobacco Use   Smoking status: Never  Smoker   Smokeless tobacco: Never Used  Substance and Sexual Activity   Alcohol use: No    Alcohol/week: 0.0 standard drinks    Comment: heavy etoh use 30 years ago   Drug use: No   Sexual activity: Yes    Birth control/protection: None  Lifestyle   Physical activity    Days per week: Not on file    Minutes per session: Not on file   Stress: Not on file  Relationships   Social connections    Talks on phone: Not on file    Gets together: Not on file    Attends religious service: Not on file    Active member of club or organization: Not on file    Attends meetings of clubs or organizations: Not on file    Relationship status: Not on file   Intimate partner violence    Fear of current or ex partner: Not on file    Emotionally abused: Not on file    Physically abused: Not on file    Forced sexual activity: Not on file  Other Topics Concern   Not on file  Social History Narrative   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 Disabled at this point.    Family History:   The patient's family history includes Diabetes in his brother, brother, mother, and sister; Heart attack in his mother; Hypertension in his brother and brother; Sleep apnea in his sister; Stroke in his mother. There is no history of Colon cancer or Liver disease.  ROS:  Please see the history of present illness.  All other ROS reviewed and negative.     Physical Exam/Data:   Vitals:   02/22/19 1530 02/22/19 1938 02/23/19 0001 02/23/19  0505  BP: (!) 188/93 (!) 173/91 117/63 111/71  Pulse: 73 74 62 64  Resp: 19 20 18 20   Temp: 98.7 F (37.1 C) 98 F (36.7 C) 98.2 F (36.8 C) 97.8 F (36.6 C)  TempSrc: Oral Oral Oral Oral  SpO2: 95% 96% 97% 93%  Weight: 118.5 kg   116.8 kg  Height: 6\' 1"  (1.854 m)       Intake/Output Summary (Last 24 hours) at 02/23/2019 1104 Last data filed at 02/23/2019 0940 Gross per 24 hour  Intake 238 ml  Output 3650 ml  Net -3412 ml   Last 3 Weights 02/23/2019 02/22/2019 02/22/2019  Weight (lbs) 257 lb 8 oz 261 lb 4.8 oz 263 lb  Weight (kg) 116.801 kg 118.525 kg 119.296 kg    Body mass index is 33.97 kg/m.  General: Well developed, well nourished, in no acute distress. Head: Normocephalic, atraumatic, sclera non-icteric, no xanthomas, nares are without discharge.  Neck: Negative for carotid bruits. JVD not elevated. Lungs: Clear bilaterally to auscultation without wheezes, rales, or rhonchi. Breathing is unlabored. Heart: RRR with S1 S2. No murmurs, rubs, or gallops appreciated. Abdomen: Soft, non-tender, non-distended with normoactive bowel sounds. No hepatomegaly. No rebound/guarding. No obvious abdominal masses. Msk:  Strength and tone appear normal for age. Extremities: No clubbing or cyanosis. No edema.  Distal pedal pulses are 2+ and equal bilaterally. Neuro: Alert and oriented X 3. No facial asymmetry. No focal deficit. Moves all extremities spontaneously. Psych:  Responds to questions appropriately with a normal affect.  EKG:  The EKG was personally reviewed and demonstrates NSR 79bpm, first degree AVB, RBBB, prior inferior and septal infarct, nonspecific TW changes, ST upsloping in V2  Relevant CV Studies: Most recent pertinent cardiac studies are outlined  above.  Laboratory Data:  Chemistry Recent Labs  Lab 02/22/19 1020 02/23/19 0629  NA 138 139  K 4.6 4.4  CL 101 102  CO2 26 28  GLUCOSE 124* 180*  BUN 40* 38*  CREATININE 1.52* 1.75*  CALCIUM 9.3 8.9  GFRNONAA  48* 41*  GFRAA 56* 47*  ANIONGAP 11 9    No results for input(s): PROT, ALBUMIN, AST, ALT, ALKPHOS, BILITOT in the last 168 hours. Hematology Recent Labs  Lab 02/22/19 1020 02/23/19 0629  WBC 5.5 6.1  RBC 3.84* 3.59*  HGB 11.8* 10.9*  HCT 36.6* 33.6*  MCV 95.3 93.6  MCH 30.7 30.4  MCHC 32.2 32.4  RDW 14.4 14.3  PLT 164 143*   Cardiac EnzymesNo results for input(s): TROPONINI in the last 168 hours. No results for input(s): TROPIPOC in the last 168 hours.  BNP Recent Labs  Lab 02/22/19 1020  BNP 344.0*    DDimer No results for input(s): DDIMER in the last 168 hours.  Radiology/Studies:  Dg Chest Portable 1 View  Result Date: 02/22/2019 CLINICAL DATA:  Shortness of breath. Lower extremity swelling. Weight gain. EXAM: PORTABLE CHEST 1 VIEW COMPARISON:  Chest x-rays dated 10/16/2018, 10/02/2018 and 09/29/2018. FINDINGS: New central pulmonary vascular congestion and bilateral interstitial edema. Marked cardiomegaly. No pleural effusion or pneumothorax seen. Osseous structures about the chest are unremarkable. IMPRESSION: 1. New central pulmonary vascular congestion and bilateral interstitial edema indicating CHF/volume overload. 2. Marked cardiomegaly, stable to slightly more prominent than recent chest x-rays. Of note, a moderate to large pericardial effusion was present on chest CT of 09/11/2018 and the slightly more prominent appearance of the heart on today's exam may indicate an increase in this pericardial effusion. Consider ECHO for further characterization. Electronically Signed   By: Franki Cabot M.D.   On: 02/22/2019 11:55    Assessment and Plan:   1. Recurrent pericardial effusion (s/p window x 2) - concerning that effusion has recurred despite 2 windows; etiology unclear. He is not describing any symptoms consistent with acute pericarditis. Pathology from 09/2018 notes that mesothelial cells were positive for cytokeratin 5/6 and wt-1 and focally positive for calretinin but  negative for MOC-31. The pericardial sac biopsy itself was negative for malignancy. I reached out to oncology to discuss path result but did not hear back. I then reached out to pathologist Dr. Lyndon Code to get a better understanding of what the interpretation means (with regard to commentary on staining) and these positive stains merely referenced that the cells were in fact mesothelial in nature rather than adenocarinoma or another source. The appearance of the cells was benign/reactive in nature and not felt to represent malignancy. This was further corroborated by the negative pathology from the sac itself. That being said, may need to involve CVTS to determine next steps. Will review with Dr. Meda Coffee.  2. Acute on chronic diastolic CHF - likely driven by eating canned goods. Reviewed 2g sodium restriction, 2L fluid restriction, daily weights with patient. Volume status improving per patient, still with about 2+ edema of mild girth and 5lb above baseline weight. Continue IV Lasix for now, possibly transition back to oral in next 24 hours.   3. CKD stage III - Cr stable in fact improved from prior. Suspect will trend closer to baseline of 2 with euvolemia.  4. CAD s/p CABG - no recurrent angina. LVEF preserved. Troponins flat c/w demand process, no ACS.  5. HTN - BP initially elevated but trending down. Follow.   For questions or  updates, please contact [place] Please consult [email or web address] for contact info under Cardiology/STEMI.   Signed, Charlie Pitter, PA-C  02/23/2019 11:04 AM  The patient was seen, examined and discussed with Melina Copa, PA-C and I agree with the above.   63 y.o. male with a hx of CAD (DES to PDA/RCA in 2012, STEMI in 2014 with DES to [facility]), chronic diastolic CHF, pericardial effusion (s/p pericardial window in 2014, redo window in 09/2018 for recurrent effusion), HTN, HLD (intolerant of statins, previously unable to fill Zetia 2/2 financial restrictions), CKD stage III  (baseline 2), DM c/b peripheral enuropathy, OSA (does not use CPAP), GERD, COPD, chronic-appearing anemia who is being seen today for the evaluation of CHF and pericardial effusion.  Patient has not been compliant with low-sodium diet, he presented with 10 pound weight gain,  there is slight worsening of moderate circumferential pericardial effusion to the was seen previously on echocardiogram in October 2019, chest x-ray shows pulmonary edema. He was noted on IV diuresis with Lasix 60 mg IV twice daily, he is -3.4 L overnight with worsening creatinine from 1.5-1.75 however baseline anywhere up to 2.3, baseline weight 113 kg currently 116 kg, I would call continue same dose of IV Lasix till tomorrow follow creatinine and clinical status.  The patient was significantly hypertensive on admission, his blood pressures are normal today, will follow and uptitrate as needed. He underwent redo subxiphoid window in 09/2018. The cytopathology read, "PERICARDIAL FLUID (SPECIMEN 1 OF 1 COLLECTED 10-01-2018). REACTIVE APPEARING MESOTHELIAL CELLS. COMMENT: THE MESOTHELIAL CELLS ARE POSITIVE FOR CYTOKERATIN 5/6 AND WT-1 AND FOCALLY POSITIVE FOR CALRETININ. THEY ARE NEGATIVE FOR MOC-31." The pericardial sac biopsy was negative for malignancy. He saw TCTS back but cancelled f/u with cardiology in 11/2018.  This was discussed with nephrology was stated that these are benign findings.  Ena Dawley, MD 02/23/2019

## 2019-02-23 NOTE — Consult Note (Signed)
   [facility] Inpatient Consult   02/23/2019  [place] May 18, 1956 [id number]   Patient is currently active with [facility] for [place] services.  Patient has been engaged by a [facility].  Our community based plan of care has focused on pharmacy medication assistance and  resource support.  On 02/20/2019 [facility] Pharmacist notes, [per documentation]  that patient Mr. Cullinane has been approved for patient assistance from [employer] for [employer]. However, patient states that he will run out of his Levemir before the assistance program supply arrives and he is unable to afford the copayment to pick up his prescription from the pharmacy.   Patient was admitted for complex due to a possible large pleural effusion and need for cardiothoracic surgery intervention/input per notes from MD's History and Physical 02/22/2019.   Plan:  Will follow for progress and follow up with  Inpatient [facility] team member to make aware that [place] [facility] following.   Of note, [facility] Care Management services does not replace or interfere with any services that are needed or arranged by inpatient TOC case management or social work.  For additional questions or referrals please contact:  Natividad Brood, RN BSN [facility] Liaison  [phone] business mobile phone Toll free office [phone]  Fax number: [phone] [email or web address]

## 2019-02-23 NOTE — Consult Note (Signed)
Cardiology Consultation:   Patient ID: Eugene [name]; [id number]; 1956/08/16   Admit date: 02/22/2019 Date of Consult: 02/23/2019  Primary Care Provider: [name], MD Primary Cardiologist: Kate Sable, MD Primary Electrophysiologist:  None  Chief Complaint: swelling, shortness of breath  Patient Profile:   Eugene [name] is a 63 y.o. male with a hx of CAD (DES to PDA/RCA in 2012, STEMI in 2014 with DES to [facility]), chronic diastolic CHF, pericardial effusion (s/p pericardial window in 2014, redo window in 09/2018 for recurrent effusion), HTN, HLD (intolerant of statins, previously unable to fill Zetia 2/2 financial restrictions), CKD stage III (baseline 2), DM c/b peripheral enuropathy, OSA (does not use CPAP), GERD, COPD, chronic-appearing anemia who is being seen today for the evaluation of CHF and pericardial effusion at the request of Dr. Denton Brick.  History of Present Illness:   The patient has prior CAD hx as noted above with last nuclear stress test in 10/2016 with large scar, minor peri-infarct ischemia, EF 45% - managed medically. He did develop recurrent pericardial effusion and Dr. Bronson Ing ordered chest CT 08/2018 which showed moderate to large pericardial effusion increased since prior CT. He was referred to CVTS and underwent redo subxiphoid window in 09/2018. The cytopathology read, "PERICARDIAL FLUID (SPECIMEN 1 OF 1 COLLECTED 10-01-2018). REACTIVE APPEARING MESOTHELIAL CELLS. COMMENT: THE MESOTHELIAL CELLS ARE POSITIVE FOR CYTOKERATIN 5/6 AND WT-1 AND FOCALLY POSITIVE FOR CALRETININ. THEY ARE NEGATIVE FOR MOC-31." The pericardial sac biopsy was negative for malignancy. He saw TCTS back but cancelled f/u with cardiology in 11/2018.  He has overall been doing well except the last 2 weeks has been snacking on canned beans and green beans. He noticed a 10lb weight gain at home along with dyspnea and swelling over the course of a week. He tried to self-adjust torsemide  up to 40mg  daily but symptoms persisted so he came to the ER for evaluation. Labs show BNP 344, troponins 46->41, Hgb 11.8->10.9, Cr 1.52->1.75, Covid-19 negative. CXR showed new central vascular congestion/bilateral interstitial edema indicating CHF/volume overload, marked cardiomegaly concerning for recurrent effusion. 2D Echo 02/22/19 showed EF 50%, restrictive filling with elevated LVEDP, normal RV function, moderate pericardial effusion (circumferential), mild-moderate aortic annular calcification, dilated IVC. He was started on IV Lasix 60mg  BID with good UOP and improvement in sx thus far. He says he feels so good he could walk home now. Weight is still up about 5lb above baseline with edema. Denies any CP or palpitations. Worked in [place] a long time ago which had asbestos.   Past Medical History:  Diagnosis Date   Anemia    Anxiety    Arteriosclerotic cardiovascular disease (ASCVD)    a. 05/2011 s/p DES to PDA and RCA. b. 08/2012 Inflat  STEMI/Cath/PCI: LM minor irregs, LAD 50p, D1 50, LCX nl, OM1 25, RCA 30-40p, 100d (treated with 2.75x16mm Promus Premier DES);     Asthma    Bell palsy    C. difficile colitis    a. 08/2012   Cholelithiasis 07/2012   Asymptomatic; identified incidentally   Chronic diastolic CHF (congestive heart failure) (HCC)    CKD (chronic kidney disease), stage III (HCC)    Contrast dye induced nephropathy    a. 08/2012 post cath/pci   COPD (chronic obstructive pulmonary disease) (HCC)    Diabetes mellitus    Peripheral neuropathy   Gallstones    GERD (gastroesophageal reflux disease)    Hyperlipidemia    Hypertension    Myocardial infarct ([place]) 09/08/12  Nephrolithiasis    Old myocardial infarction    Pericardial effusion    a. s/p window in 2014. b. s/p redo window in 09/2018.   Peripheral neuropathy    PONV (postoperative nausea and vomiting)    Sleep apnea    Statin intolerance     Past Surgical History:  Procedure  Laterality Date   CARDIAC CATHETERIZATION     CHEST TUBE INSERTION     CIRCUMCISION     COLONOSCOPY     In [facility], approximately 2011 per patient, was normal. Advised to come back in 10 years.   ESOPHAGOGASTRODUODENOSCOPY     [place] over 20 yrs ago   ESOPHAGOGASTRODUODENOSCOPY  06/12/2012   GYJ:EHUDJS esophagus-status post [facility] dilation. Abnormal gastric mucosa of uncertain significance-status post biopsy   INTRAOPERATIVE TRANSESOPHAGEAL ECHOCARDIOGRAM N/A 06/23/2013   Procedure: INTRAOPERATIVE TRANSESOPHAGEAL ECHOCARDIOGRAM;  Surgeon: Grace Isaac, MD;  Location: [place];  Service: Open Heart Surgery;  Laterality: N/A;   LEFT HEART CATHETERIZATION WITH CORONARY ANGIOGRAM N/A 09/08/2012   Procedure: LEFT HEART CATHETERIZATION WITH CORONARY ANGIOGRAM;  Surgeon: [name], MD;  Location: [facility] CATH LAB;  Service: Cardiovascular;  Laterality: N/A;   stents     SUBXYPHOID PERICARDIAL WINDOW N/A 06/23/2013   Procedure: SUBXYPHOID PERICARDIAL WINDOW;  Surgeon: Grace Isaac, MD;  Location: [place];  Service: Thoracic;  Laterality: N/A;   SUBXYPHOID PERICARDIAL WINDOW  10/01/2018   SUBXYPHOID PERICARDIAL WINDOW N/A 10/01/2018   Procedure: REDO SUBXYPHOID PERICARDIAL WINDOW;  Surgeon: Grace Isaac, MD;  Location: [place];  Service: Thoracic;  Laterality: N/A;   TEE WITHOUT CARDIOVERSION N/A 10/01/2018   Procedure: TRANSESOPHAGEAL ECHOCARDIOGRAM (TEE);  Surgeon: Grace Isaac, MD;  Location: [facility] OR;  Service: Thoracic;  Laterality: N/A;     Inpatient Medications: Scheduled Meds:  aspirin  81 mg Oral QHS   fenofibrate  160 mg Oral QHS   furosemide  60 mg Intravenous Q12H   insulin aspart  0-20 Units Subcutaneous TID WC   insulin aspart  0-5 Units Subcutaneous QHS   insulin detemir  72 Units Subcutaneous Q2200   isosorbide mononitrate  60 mg Oral Daily   losartan  25 mg Oral Daily   metoprolol tartrate  25 mg Oral Once   metoprolol  tartrate  25 mg Oral BID   sodium chloride flush  3 mL Intravenous Q12H   Continuous Infusions:  sodium chloride     PRN Meds: sodium chloride, acetaminophen **OR** acetaminophen, albuterol, ALPRAZolam, labetalol, ofloxacin, ondansetron **OR** ondansetron (ZOFRAN) IV, polyethylene glycol, sodium chloride flush, traZODone  Home Meds: Prior to Admission medications   Medication Sig Start Date End Date Taking? Authorizing Provider  acetaminophen (TYLENOL) 500 MG tablet Take 1,500 mg by mouth every 6 (six) hours as needed for mild pain, fever or headache.    Yes [provider]  allopurinol (ZYLOPRIM) 100 MG tablet Take one tablet by mouth daily Patient taking differently: Take 100 mg by mouth daily as needed (for gout flare up).  06/19/18  Yes Luking, Elayne Snare, MD  ALPRAZolam (XANAX) 0.5 MG tablet TAKE 1/2-1 TABLET BY MOUTH AT BEDTIME AS NEEDED Patient taking differently: Take 0.25-0.5 mg by mouth at bedtime as needed for anxiety.  09/30/18  Yes [name], MD  aspirin 81 MG chewable tablet Chew 81 mg by mouth at bedtime.   Yes [provider]  Insulin Detemir (LEVEMIR FLEXTOUCH) 100 UNIT/ML Pen Inject 80 Units into the skin daily at 10 pm. Patient taking differently: Inject  72 Units into the skin daily at 10 pm.  02/20/19  Yes Luking, Elayne Snare, MD  isosorbide mononitrate (IMDUR) 60 MG 24 hr tablet TAKE 1 TABLET (60 MG TOTAL) BY MOUTH DAILY. 11/10/18  Yes Herminio Commons, MD  losartan (COZAAR) 25 MG tablet Take 25 mg by mouth daily.  11/14/18  Yes [provider]  metoprolol tartrate (LOPRESSOR) 25 MG tablet TAKE 1 TABLET TWICE DAILY Patient taking differently: Take 25 mg by mouth 2 (two) times daily.  01/21/19  Yes Luking, Elayne Snare, MD  ofloxacin (FLOXIN) 0.3 % OTIC solution PLACE 5 DROPS INTO THE LEFT EAR 2 (TWO) TIMES DAILY. Patient taking differently: Place 5 drops into the left ear daily as needed.  01/09/19  Yes Mikey Kirschner, MD  torsemide (DEMADEX) 20  MG tablet Take 1-2 tablets by mouth each morning as directed by physician Patient taking differently: Take 20-30 mg by mouth See admin instructions. Alternates daily taking 20mg  one day and 30 mg the next. 01/21/19  Yes Luking, Elayne Snare, MD  traMADol (ULTRAM) 50 MG tablet TAKE 1 TABLET (50 MG TOTAL) BY MOUTH EVERY 6 (SIX) HOURS AS NEEDED Patient taking differently: Take 50 mg by mouth daily as needed.  01/22/19  Yes [name], MD  Cholecalciferol (VITAMIN D3 PO) Take 1 capsule by mouth every other day.    [provider]  fenofibrate 160 MG tablet TAKE 1 TABLET AT BEDTIME Patient not taking: No sig reported 09/19/18   [name], MD  glimepiride (AMARYL) 1 MG tablet Take 1 tablet (1 mg total) by mouth at bedtime. Patient not taking: Reported on 02/22/2019 01/06/19   [name], MD  glucose blood (ACCU-CHEK SMARTVIEW) test strip TEST BLOOD SUGAR ONE TIME DAILY Patient not taking: Reported on 02/22/2019 06/19/18   [name], MD  Insulin Pen Needle (BD PEN NEEDLE NANO U/F) 32G X 4 MM MISC Use once daily as directed Patient not taking: Reported on 02/22/2019 05/02/17   [name], MD  metroNIDAZOLE (FLAGYL) 500 MG tablet Take 1 tablet (500 mg total) by mouth 3 (three) times daily. Patient not taking: Reported on 02/22/2019 12/12/18   [name], MD  rosuvastatin (CRESTOR) 5 MG tablet Take one tablet on Mondays and Fridays Patient not taking: Reported on 02/22/2019 01/05/19   [name], MD    Allergies:    Allergies  Allergen Reactions   Hydrocodone Nausea And Vomiting   Lisinopril Cough   Neurontin [Gabapentin] Other (See Comments)    Reaction:  Suicidal thoughts    Statins Other (See Comments)    Reaction:  Muscle pain    Metformin And Related Diarrhea   Norvasc [Amlodipine Besylate] Swelling and Other (See Comments)    Reaction:  Pedal edema    Social History:   Social History   Socioeconomic History   Marital status: Married    Spouse name:  Not on file   Number of children: 2   Years of education: Not on file   Highest education level: Not on file  Occupational History   Occupation: Personal assistant: [employer]    Comment: Quarry manager strain: Not on file   Food insecurity    Worry: Not on file    Inability: Not on Lexicographer needs    Medical: Not on file    Non-medical: Not on file  Tobacco Use   Smoking status: Never  Smoker   Smokeless tobacco: Never Used  Substance and Sexual Activity   Alcohol use: No    Alcohol/week: 0.0 standard drinks    Comment: heavy etoh use 30 years ago   Drug use: No   Sexual activity: Yes    Birth control/protection: None  Lifestyle   Physical activity    Days per week: Not on file    Minutes per session: Not on file   Stress: Not on file  Relationships   Social connections    Talks on phone: Not on file    Gets together: Not on file    Attends religious service: Not on file    Active member of club or organization: Not on file    Attends meetings of clubs or organizations: Not on file    Relationship status: Not on file   Intimate partner violence    Fear of current or ex partner: Not on file    Emotionally abused: Not on file    Physically abused: Not on file    Forced sexual activity: Not on file  Other Topics Concern   Not on file  Social History Narrative   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 Disabled at this point.    Family History:   The patient's family history includes Diabetes in his brother, brother, mother, and sister; Heart attack in his mother; Hypertension in his brother and brother; Sleep apnea in his sister; Stroke in his mother. There is no history of Colon cancer or Liver disease.  ROS:  Please see the history of present illness.  All other ROS reviewed and negative.     Physical Exam/Data:   Vitals:   02/22/19 1530 02/22/19 1938 02/23/19 0001 02/23/19  0505  BP: (!) 188/93 (!) 173/91 117/63 111/71  Pulse: 73 74 62 64  Resp: 19 20 18 20   Temp: 98.7 F (37.1 C) 98 F (36.7 C) 98.2 F (36.8 C) 97.8 F (36.6 C)  TempSrc: Oral Oral Oral Oral  SpO2: 95% 96% 97% 93%  Weight: 118.5 kg   116.8 kg  Height: 6\' 1"  (1.854 m)       Intake/Output Summary (Last 24 hours) at 02/23/2019 1104 Last data filed at 02/23/2019 0940 Gross per 24 hour  Intake 238 ml  Output 3650 ml  Net -3412 ml   Last 3 Weights 02/23/2019 02/22/2019 02/22/2019  Weight (lbs) 257 lb 8 oz 261 lb 4.8 oz 263 lb  Weight (kg) 116.801 kg 118.525 kg 119.296 kg    Body mass index is 33.97 kg/m.  General: Well developed, well nourished, in no acute distress. Head: Normocephalic, atraumatic, sclera non-icteric, no xanthomas, nares are without discharge.  Neck: Negative for carotid bruits. JVD not elevated. Lungs: Clear bilaterally to auscultation without wheezes, rales, or rhonchi. Breathing is unlabored. Heart: RRR with S1 S2. No murmurs, rubs, or gallops appreciated. Abdomen: Soft, non-tender, non-distended with normoactive bowel sounds. No hepatomegaly. No rebound/guarding. No obvious abdominal masses. Msk:  Strength and tone appear normal for age. Extremities: No clubbing or cyanosis. No edema.  Distal pedal pulses are 2+ and equal bilaterally. Neuro: Alert and oriented X 3. No facial asymmetry. No focal deficit. Moves all extremities spontaneously. Psych:  Responds to questions appropriately with a normal affect.  EKG:  The EKG was personally reviewed and demonstrates NSR 79bpm, first degree AVB, RBBB, prior inferior and septal infarct, nonspecific TW changes, ST upsloping in V2  Relevant CV Studies: Most recent pertinent cardiac studies are outlined  above.  Laboratory Data:  Chemistry Recent Labs  Lab 02/22/19 1020 02/23/19 0629  NA 138 139  K 4.6 4.4  CL 101 102  CO2 26 28  GLUCOSE 124* 180*  BUN 40* 38*  CREATININE 1.52* 1.75*  CALCIUM 9.3 8.9  GFRNONAA  48* 41*  GFRAA 56* 47*  ANIONGAP 11 9    No results for input(s): PROT, ALBUMIN, AST, ALT, ALKPHOS, BILITOT in the last 168 hours. Hematology Recent Labs  Lab 02/22/19 1020 02/23/19 0629  WBC 5.5 6.1  RBC 3.84* 3.59*  HGB 11.8* 10.9*  HCT 36.6* 33.6*  MCV 95.3 93.6  MCH 30.7 30.4  MCHC 32.2 32.4  RDW 14.4 14.3  PLT 164 143*   Cardiac EnzymesNo results for input(s): TROPONINI in the last 168 hours. No results for input(s): TROPIPOC in the last 168 hours.  BNP Recent Labs  Lab 02/22/19 1020  BNP 344.0*    DDimer No results for input(s): DDIMER in the last 168 hours.  Radiology/Studies:  Dg Chest Portable 1 View  Result Date: 02/22/2019 CLINICAL DATA:  Shortness of breath. Lower extremity swelling. Weight gain. EXAM: PORTABLE CHEST 1 VIEW COMPARISON:  Chest x-rays dated 10/16/2018, 10/02/2018 and 09/29/2018. FINDINGS: New central pulmonary vascular congestion and bilateral interstitial edema. Marked cardiomegaly. No pleural effusion or pneumothorax seen. Osseous structures about the chest are unremarkable. IMPRESSION: 1. New central pulmonary vascular congestion and bilateral interstitial edema indicating CHF/volume overload. 2. Marked cardiomegaly, stable to slightly more prominent than recent chest x-rays. Of note, a moderate to large pericardial effusion was present on chest CT of 09/11/2018 and the slightly more prominent appearance of the heart on today's exam may indicate an increase in this pericardial effusion. Consider ECHO for further characterization. Electronically Signed   By: Franki Cabot M.D.   On: 02/22/2019 11:55    Assessment and Plan:   1. Recurrent pericardial effusion (s/p window x 2) - concerning that effusion has recurred despite 2 windows; etiology unclear. He is not describing any symptoms consistent with acute pericarditis. Pathology from 09/2018 notes that mesothelial cells were positive for cytokeratin 5/6 and wt-1 and focally positive for calretinin but  negative for MOC-31. The pericardial sac biopsy itself was negative for malignancy. I reached out to oncology to discuss path result but did not hear back. I then reached out to pathologist Dr. Lyndon Code to get a better understanding of what the interpretation means (with regard to commentary on staining) and these positive stains merely referenced that the cells were in fact mesothelial in nature rather than adenocarinoma or another source. The appearance of the cells was benign/reactive in nature and not felt to represent malignancy. This was further corroborated by the negative pathology from the sac itself. That being said, may need to involve CVTS to determine next steps. Will review with Dr. Meda Coffee.  2. Acute on chronic diastolic CHF - likely driven by eating canned goods. Reviewed 2g sodium restriction, 2L fluid restriction, daily weights with patient. Volume status improving per patient, still with about 2+ edema of mild girth and 5lb above baseline weight. Continue IV Lasix for now, possibly transition back to oral in next 24 hours.   3. CKD stage III - Cr stable in fact improved from prior. Suspect will trend closer to baseline of 2 with euvolemia.  4. CAD s/p CABG - no recurrent angina. LVEF preserved. Troponins flat c/w demand process, no ACS.  5. HTN - BP initially elevated but trending down. Follow.   For questions or  updates, please contact [place] Please consult [email or web address] for contact info under Cardiology/STEMI.    Signed, Charlie Pitter, PA-C  02/23/2019 11:04 AM

## 2019-02-23 NOTE — Progress Notes (Addendum)
PROGRESS NOTE                                                                                                                                                                                                             Patient Demographics:    Eugene [name], is a 63 y.o. male, DOB - 1956-03-15, ZOX:[id number]  Admit date - 02/22/2019   Admitting Physician Courage Denton Brick, MD  Outpatient Primary MD for the patient is [name], MD  LOS - 1  Outpatient Specialists: Dr Lorelee New   Chief Complaint  Patient presents with  . Shortness of Breath       Brief Narrative   63 year old morbidly obese male with history of CAD status post PCI, recurrent pericardial effusion status post pericardial window and 2014 and February 2020, type 2 diabetes mellitus, hypertension, obstructive sleep apnea (nonadherent to CPAP), chronic kidney disease stage III, COPD, chronic diastolic CHF who presented to any pain ED with progressive shortness of breath and found to be in acute on chronic diastolic CHF with 10 pound weight gain over the past 2 weeks despite increasing dose of torsemide recently.  Concerning for pericardial effusion and patient was transferred to [facility] for echo, cardiology and cardiothoracic evaluation.    Subjective:   Patient reports that he is breathing slightly better today with diuretics.  Negative balance of 3.4 L since admission.   Assessment  & Plan :    Principal Problem:   Acute on chronic diastolic CHF (congestive heart failure) ([place]) Suspect due to diet nonadherence.  Continue IV Lasix twice daily, showing good diuresis.  Baseline weight of 250 pounds (patient is 6 pounds over).  Continue strict I's/O and daily weight.  Continue remaining home meds.  Cardiology following and possibly transition to oral diuretic tomorrow.  Recurrent pericardial effusion Patient has had 2 pericardial window  was done in the past.  No signs or symptoms of tamponade or pericarditis.  2D echo shows moderate pericardial effusion.  Pericardial fluid pathology from 09/2018 showed mesothelial cells.  The biopsy of the sac was negative for malignancy.  This was discussed by cardiology with pathologist and nephrologist and appears that the cells are benign and reactive and nonmalignant. Discussed with cardiology.  Given absence of symptoms of tamponade or pericarditis and does not need cardiothoracic surgery evaluation for pericardial window.  Will instruct to follow-up with Dr. Germain Osgood as outpatient.   Active Problems: Coronary artery disease status post MI History of angioplasty and stent placement in 2012 and 2013.  No chest pain symptoms.  Intolerant to statin.  Continue fenofibrate.  Continue aspirin, Imdur and metoprolol.  Uncontrolled type 2 diabetes mellitus with hyperglycemia A1c of 8.2.  Continue Levemir along with sliding scale coverage.  Essential hypertension Elevated blood pressure on presentation.  Resume home meds including beta-blocker, Imdur and losartan.  Obesity/obstructive sleep apnea Nonadherent to CPAP.  Counseled.  Counseled on diet and exercise to lose weight.  Chronic kidney disease stage III Stable.  Avoid nephrotoxins.  Monitor while on diuresis.       Code Status : Full code  Family Communication  : None at bedside  Disposition Plan  : Home possibly tomorrow if symptoms better  Barriers For Discharge : Active symptoms  Consults  : Cardiology  Procedures  : 2D echo  DVT Prophylaxis  :  Lovenox -   Lab Results  Component Value Date   PLT 143 (L) 02/23/2019    Antibiotics  :    Anti-infectives (From admission, onward)   None        Objective:   Vitals:   02/22/19 1938 02/23/19 0001 02/23/19 0505 02/23/19 1235  BP: (!) 173/91 117/63 111/71 113/67  Pulse: 74 62 64 62  Resp: 20 18 20 20   Temp: 98 F (36.7 C) 98.2 F (36.8 C) 97.8 F (36.6 C)    TempSrc: Oral Oral Oral   SpO2: 96% 97% 93% 98%  Weight:   116.8 kg   Height:        Wt Readings from Last 3 Encounters:  02/23/19 116.8 kg  10/16/18 110.2 kg  10/04/18 115.9 kg     Intake/Output Summary (Last 24 hours) at 02/23/2019 1348 Last data filed at 02/23/2019 0940 Gross per 24 hour  Intake 180 ml  Output 2650 ml  Net -2470 ml     Physical Exam  Gen: not in distress HEENT: no pallor, moist mucosa, supple neck Chest: Diminished bibasilar breath sounds CVS: N S1&S2, no murmurs, rubs or gallop GI: soft, NT, ND,  Musculoskeletal: warm, 1+ pitting edema bilaterally     Data Review:    CBC Recent Labs  Lab 02/22/19 1020 02/23/19 0629  WBC 5.5 6.1  HGB 11.8* 10.9*  HCT 36.6* 33.6*  PLT 164 143*  MCV 95.3 93.6  MCH 30.7 30.4  MCHC 32.2 32.4  RDW 14.4 14.3  LYMPHSABS 1.1  --   MONOABS 0.5  --   EOSABS 0.1  --   BASOSABS 0.0  --     Chemistries  Recent Labs  Lab 02/22/19 1020 02/23/19 0629  NA 138 139  K 4.6 4.4  CL 101 102  CO2 26 28  GLUCOSE 124* 180*  BUN 40* 38*  CREATININE 1.52* 1.75*  CALCIUM 9.3 8.9  MG 1.9  --    ------------------------------------------------------------------------------------------------------------------ No results for input(s): CHOL, HDL, LDLCALC, TRIG, CHOLHDL, LDLDIRECT in the last 72 hours.  Lab Results  Component Value Date   HGBA1C 8.2 (H) 01/01/2019   ------------------------------------------------------------------------------------------------------------------ No results for input(s): TSH, T4TOTAL, T3FREE, THYROIDAB in the last 72 hours.  Invalid input(s): FREET3 ------------------------------------------------------------------------------------------------------------------ No results for input(s): VITAMINB12, FOLATE, FERRITIN, TIBC, IRON, RETICCTPCT in the last 72 hours.  Coagulation profile No results for input(s): INR, PROTIME in the last 168 hours.  No results for input(s): DDIMER in the  last 72 hours.  Cardiac  Enzymes No results for input(s): CKMB, TROPONINI, MYOGLOBIN in the last 168 hours.  Invalid input(s): CK ------------------------------------------------------------------------------------------------------------------    Component Value Date/Time   BNP 344.0 (H) 02/22/2019 1020   BNP 12.4 07/29/2012 1040    Inpatient Medications  Scheduled Meds: . aspirin  81 mg Oral QHS  . fenofibrate  160 mg Oral QHS  . furosemide  60 mg Intravenous Q12H  . insulin aspart  0-20 Units Subcutaneous TID WC  . insulin aspart  0-5 Units Subcutaneous QHS  . insulin detemir  72 Units Subcutaneous Q2200  . isosorbide mononitrate  60 mg Oral Daily  . losartan  25 mg Oral Daily  . metoprolol tartrate  25 mg Oral Once  . metoprolol tartrate  25 mg Oral BID  . sodium chloride flush  3 mL Intravenous Q12H   Continuous Infusions: . sodium chloride     PRN Meds:.sodium chloride, acetaminophen **OR** acetaminophen, albuterol, ALPRAZolam, labetalol, ofloxacin, ondansetron **OR** ondansetron (ZOFRAN) IV, polyethylene glycol, sodium chloride flush, traZODone  Micro Results Recent Results (from the past 240 hour(s))  SARS Coronavirus 2 (CEPHEID- Performed in [place] hospital lab), Hosp Order     Status: None   Collection Time: 02/22/19 11:28 AM   Specimen: Nasopharyngeal Swab  Result Value Ref Range Status   SARS Coronavirus 2 NEGATIVE NEGATIVE Final    Comment: (NOTE) If result is NEGATIVE SARS-CoV-2 target nucleic acids are NOT DETECTED. The SARS-CoV-2 RNA is generally detectable in upper and lower  respiratory specimens during the acute phase of infection. The lowest  concentration of SARS-CoV-2 viral copies this assay can detect is 250  copies / mL. A negative result does not preclude SARS-CoV-2 infection  and should not be used as the sole basis for treatment or other  patient management decisions.  A negative result may occur with  improper specimen collection /  handling, submission of specimen other  than nasopharyngeal swab, presence of viral mutation(s) within the  areas targeted by this assay, and inadequate number of viral copies  (<250 copies / mL). A negative result must be combined with clinical  observations, patient history, and epidemiological information. If result is POSITIVE SARS-CoV-2 target nucleic acids are DETECTED. The SARS-CoV-2 RNA is generally detectable in upper and lower  respiratory specimens dur ing the acute phase of infection.  Positive  results are indicative of active infection with SARS-CoV-2.  Clinical  correlation with patient history and other diagnostic information is  necessary to determine patient infection status.  Positive results do  not rule out bacterial infection or co-infection with other viruses. If result is PRESUMPTIVE POSTIVE SARS-CoV-2 nucleic acids MAY BE PRESENT.   A presumptive positive result was obtained on the submitted specimen  and confirmed on repeat testing.  While 2019 novel coronavirus  (SARS-CoV-2) nucleic acids may be present in the submitted sample  additional confirmatory testing may be necessary for epidemiological  and / or clinical management purposes  to differentiate between  SARS-CoV-2 and other Sarbecovirus currently known to infect humans.  If clinically indicated additional testing with an alternate test  methodology [phone]) is advised. The SARS-CoV-2 RNA is generally  detectable in upper and lower respiratory sp ecimens during the acute  phase of infection. The expected result is Negative. Fact Sheet for Patients:  StrictlyIdeas.no Fact Sheet for Healthcare Providers: BankingDealers.co.za This test is not yet approved or cleared by the [place] FDA and has been authorized for detection and/or diagnosis of SARS-CoV-2 by FDA under an Emergency Use Authorization (  EUA).  This EUA will remain in effect (meaning this  test can be used) for the duration of the COVID-19 declaration under Section 564(b)(1) of the Act, 21 U.S.C. section 360bbb-3(b)(1), unless the authorization is terminated or revoked sooner. Performed at [facility], [address] [place], [place] [address]     Radiology Reports Dg Chest Portable 1 View  Result Date: 02/22/2019 CLINICAL DATA:  Shortness of breath. Lower extremity swelling. Weight gain. EXAM: PORTABLE CHEST 1 VIEW COMPARISON:  Chest x-rays dated 10/16/2018, 10/02/2018 and 09/29/2018. FINDINGS: New central pulmonary vascular congestion and bilateral interstitial edema. Marked cardiomegaly. No pleural effusion or pneumothorax seen. Osseous structures about the chest are unremarkable. IMPRESSION: 1. New central pulmonary vascular congestion and bilateral interstitial edema indicating CHF/volume overload. 2. Marked cardiomegaly, stable to slightly more prominent than recent chest x-rays. Of note, a moderate to large pericardial effusion was present on chest CT of 09/11/2018 and the slightly more prominent appearance of the heart on today's exam may indicate an increase in this pericardial effusion. Consider ECHO for further characterization. Electronically Signed   By: Franki Cabot M.D.   On: 02/22/2019 11:55    Time Spent in minutes  25   Kenadi Miltner M.D on 02/23/2019 at 1:48 PM  Between 7am to 7pm - Pager - [phone]  After 7pm go to [email or web address] - password [facility]  [facility] -  Office  [phone]

## 2019-02-23 NOTE — Telephone Encounter (Signed)
Patient is currently in the hospital for fluid buildup.  He wanted [place] to know in case his insulin was delivered to the office.  He said if it comes to call Suanne Marker at [phone] and she will come and get it.

## 2019-02-24 ENCOUNTER — Telehealth: Payer: Self-pay | Admitting: Cardiovascular Disease

## 2019-02-24 DIAGNOSIS — I5043 Acute on chronic combined systolic (congestive) and diastolic (congestive) heart failure: Secondary | ICD-10-CM

## 2019-02-24 DIAGNOSIS — Z794 Long term (current) use of insulin: Secondary | ICD-10-CM

## 2019-02-24 DIAGNOSIS — I25119 Atherosclerotic heart disease of native coronary artery with unspecified angina pectoris: Secondary | ICD-10-CM

## 2019-02-24 DIAGNOSIS — E0865 Diabetes mellitus due to underlying condition with hyperglycemia: Secondary | ICD-10-CM

## 2019-02-24 DIAGNOSIS — G4733 Obstructive sleep apnea (adult) (pediatric): Secondary | ICD-10-CM

## 2019-02-24 LAB — GLUCOSE, CAPILLARY
Glucose-Capillary: 233 mg/dL — ABNORMAL HIGH (ref 70–99)
Glucose-Capillary: 182 mg/dL — ABNORMAL HIGH (ref 70–99)

## 2019-02-24 MED ORDER — SPIRONOLACTONE 25 MG PO TABS: 12.5000 mg | ORAL_TABLET | Freq: Every day | ORAL | 0 refills | Status: DC

## 2019-02-24 MED ORDER — TORSEMIDE 20 MG PO TABS: 40.0000 mg | ORAL_TABLET | Freq: Every day | ORAL | Status: DC

## 2019-02-24 MED ORDER — TORSEMIDE 20 MG PO TABS: 40.0000 mg | ORAL_TABLET | Freq: Every day | ORAL | 1 refills | Status: DC

## 2019-02-24 MED ORDER — SPIRONOLACTONE 12.5 MG HALF TABLET
12.5000 mg | ORAL_TABLET | Freq: Every day | ORAL | Status: DC
  Administered 2019-02-24: 12.5 mg via ORAL
  Filled 2019-02-24: qty 1

## 2019-02-24 NOTE — Discharge Instructions (Signed)
Heart Failure, Self Care Heart failure is a serious condition. This document explains the things you need to do to take care of yourself after a heart failure diagnosis. You may be asked to change your diet, take certain medicines, and make other lifestyle changes in order to stay as healthy as possible. Your health care provider may also give you more specific instructions. If you have problems or questions, contact your health care provider. What are the risks? Having heart failure puts you at higher risk for certain problems. These problems can get worse if you do not take good care of yourself. Problems may include:  Blood clotting problems. This may cause a stroke.  Damage to the kidneys, liver, or lungs.  Abnormal heart rhythms. Supplies needed:  Scale for monitoring weight.  Blood pressure monitor.  Notebook.  Medicines. How to care for yourself when you have heart failure Medicines Take over-the-counter and prescription medicines only as told by your health care provider. Medicines reduce the workload of your heart, slow the progression of heart failure, and improve symptoms. Take your medicines every day.  Do not stop taking your medicine unless your health care provider tells you to do so.  Do not skip any dose of medicine.  Refill your prescriptions before you run out of medicine. Eating and drinking   Eat heart-healthy foods. Talk with a dietitian to make an eating plan that is right for you. ? Choose foods that contain no trans fat and are low in saturated fat and cholesterol. Healthy choices include fresh or frozen fruits and vegetables, fish, lean meats, legumes, fat-free or low-fat dairy products, and whole-grain or high-fiber foods. ? Limit salt (sodium) if told by your health care provider. Sodium restriction may reduce symptoms of heart failure. Ask a dietitian to recommend heart-healthy seasonings. ? Use healthy cooking methods instead of frying. Healthy methods  include roasting, grilling, broiling, baking, poaching, steaming, and stir-frying.  Limit your fluid intake, if directed by your health care provider. Fluid restriction may reduce symptoms of heart failure. Alcohol use  Do not drink alcohol if: ? Your health care provider tells you not to drink. ? Your heart was damaged by alcohol, or you have severe heart failure. ? You are pregnant, may be pregnant, or are planning to become pregnant.  If you drink alcohol: ? Limit how much you use to:  0-1 drink a day for women.  0-2 drinks a day for men. ? Be aware of how much alcohol is in your drink. In the [place] one drink equals one 12 oz bottle of beer (355 mL), one 5 oz glass of wine (148 mL), or one 1 oz glass of hard liquor (44 mL). Lifestyle   Do not use any products that contain nicotine or tobacco, such as cigarettes, e-cigarettes, and chewing tobacco. If you need help quitting, ask your health care provider. ? Do not use nicotine gum or patches before talking to your health care provider.  Do not use illegal drugs.  Work with your health care provider to safely reach the right body weight.  Do physical activity if told by your health care provider. Talk to your health care provider before you begin an exercise if: ? You are an older adult. ? You have severe heart failure.  Learn to manage stress. If you need help to do this, ask your health care provider.  Participate in or seek rehabilitation as needed to keep or improve your independence and quality of life.  Plan   rest periods when you get tired. Monitoring important information   Weigh yourself every day. This will help you to notice if too much fluid is building up in your body. ? Weigh yourself every morning after you urinate and before you eat breakfast. ? Wear the same amount of clothing each time you weigh yourself. ? Record your daily weight. Provide your health care provider with your weight record.  Monitor and  record your pulse and blood pressure as told by your health care provider. Dealing with extreme temperatures  If the weather is extremely hot: ? Avoid vigorous physical activity. ? Use air conditioning or fans, or find a cooler location. ? Avoid caffeine and alcohol. ? Wear loose-fitting, lightweight, and light-colored clothing.  If the weather is extremely cold: ? Avoid vigorous activity. ? Layer your clothes. ? Wear mittens or gloves, a hat, and a scarf when you go outside. ? Avoid alcohol. Follow these instructions at home:  Stay up to date with vaccines. Pneumococcal and flu (influenza) vaccines are especially important in preventing infections of the airways.  Keep all follow-up visits as told by your health care provider. This is important. Contact a health care provider if you:  Have a rapid weight gain.  Have increasing shortness of breath.  Are unable to participate in your usual physical activities.  Get tired easily.  Cough more than normal, especially with physical activity.  Lose your appetite or feel nauseous.  Have any swelling or more swelling in areas such as your hands, feet, ankles, or abdomen.  Are unable to sleep because it is hard to breathe.  Feel like your heart is beating quickly (palpitations).  Become dizzy or light-headed when you stand up. Get help right away if you:  Have trouble breathing.  Notice or your family notices a change in your awareness, such as having trouble staying awake or concentrating.  Have pain or discomfort in your chest.  Have an episode of fainting (syncope). These symptoms may represent a serious problem that is an emergency. Do not wait to see if the symptoms will go away. Get medical help right away. Call your local emergency services (911 in the [place] Do not drive yourself to the hospital. Summary  Heart failure is a serious condition. To care for yourself, you may be asked to change your diet, take certain  medicines, and make other lifestyle changes.  Take your medicines every day. Do not stop taking them unless your health care provider tells you to do so.  Eat heart-healthy foods, such as fresh or frozen fruits and vegetables, fish, lean meats, legumes, fat-free or low-fat dairy products, and whole-grain or high-fiber foods.  Ask your health care provider if you have any alcohol restrictions. You may have to stop drinking alcohol if you have severe heart failure.  Contact your health care provider if you notice problems, such as rapid weight gain or a fast heartbeat. Get help right away if you faint, or have chest pain or trouble breathing. This information is not intended to replace advice given to you by your health care provider. Make sure you discuss any questions you have with your health care provider. Document Released: 11/26/2018 Document Revised: 11/25/2018 Document Reviewed: 11/26/2018 Elsevier Patient Education  2020 [employer].

## 2019-02-24 NOTE — Telephone Encounter (Signed)
TOC: pt was admitted to the hospital for CHF and pericardial effusion. He is being dc today and needs a 1 week f/u. Lucky for him he's already scheduled 7/7 with Jamesetta So. Can you just make sure he gets a TOC call as well and make sure to include TOC in appt notes?

## 2019-02-24 NOTE — Progress Notes (Signed)
   Dr. Meda Coffee wanted to ensure pt had f/u scheduled. He had previously existing appt on 7/7 which is perfect timing. I [employer] office schedulers to make sure pt gets TOC call as well for CHF, and to update appt notes to reflect this is a TOC visit. Dayna Dunn PA-C

## 2019-02-24 NOTE — Discharge Summary (Signed)
Physician Discharge Summary  Eugene [name] ZOX:[id number] DOB: Dec 25, 1955 DOA: 02/22/2019  PCP: [name], MD  Admit date: 02/22/2019 Discharge date: 02/24/2019  Admitted From: Home Disposition: Home  Recommendations for Outpatient Follow-up:  1. Follow up with PCP in 1-2 weeks 2. Has appointment to see his cardiologist in 1 week. 3. Follow-up with cardiothoracic surgery Dr. Servando Snare in 3 weeks.  Home Health: None Equipment/Devices: CPAP at night  Discharge Condition: Fair CODE STATUS: Full code Diet recommendation: Heart Healthy / Carb Modified     Discharge Diagnoses:  Principal Problem:   Acute on chronic diastolic CHF (congestive heart failure) ([place])   Active Problems:   Essential hypertension   CAD- MI/RCA PCI-DES x2 2012, and RCA DES Jan 2014   Obstructive sleep apnea- on C-pap   CAD (coronary artery disease)   Recurrent Pericardial effusion/pericardial window on 06/23/2013 and 10/01/2018   Morbid obesity ([place])   Diabetes mellitus due to underlying condition, uncontrolled, with hyperglycemia, with long-term current use of insulin ([place])  Brief narrative/HPI Please refer to admission H&P for details, in brief,63 year old morbidly obese male with history of CAD status post PCI, recurrent pericardial effusion status post pericardial window and 2014 and February 2020, type 2 diabetes mellitus, hypertension, obstructive sleep apnea (nonadherent to CPAP), chronic kidney disease stage III, COPD, chronic diastolic CHF who presented to any pain ED with progressive shortness of breath and found to be in acute on chronic diastolic CHF with 10 pound weight gain over the past 2 weeks despite increasing dose of torsemide recently.  Concerning for pericardial effusion and patient was transferred to [facility] for echo, cardiology and cardiothoracic evaluation.  Principal Problem:   Acute on chronic diastolic CHF (congestive heart failure) ([place]) Suspect due to diet  nonadherence.    Diuresed well with IV Lasix twice daily with 5L negative balance. Resumed his ARB, metoprolol and Imdur.  Added low-dose Aldactone. Symptoms much improved today. Cardiology consult appreciated and recommend discharging him on  oral torsemide 40 mg daily (patient was taking 20 mg daily at home). Instructed on dietary adherence and medication compliance.  Also counseled on diet and weight loss.  Recurrent pericardial effusion Patient has had 2 pericardial window was done in the past.  No signs or symptoms of tamponade or pericarditis.  2D echo shows moderate pericardial effusion.  Pericardial fluid pathology from 09/2018 showed mesothelial cells.  The biopsy of the sac was negative for malignancy.  This was discussed by cardiology with pathologist and nephrologist and appears that the cells are benign and reactive and nonmalignant. Discussed with cardiology.  Given absence of symptoms of tamponade or pericarditis and does not need cardiothoracic surgery evaluation for pericardial window.    Instructed to follow-up with Dr. Germain Osgood as outpatient.   Active Problems: Coronary artery disease status post MI History of angioplasty and stent placement in 2012 and 2013.  No chest pain symptoms.  Intolerant to statin.  Continue fenofibrate.  Continue aspirin, Imdur and metoprolol.  Uncontrolled type 2 diabetes mellitus with hyperglycemia A1c of 8.2.  Continue Levemir along with sliding scale coverage.  Essential hypertension Elevated blood pressure on presentation.    Resume home meds.  Currently stable.  Obesity/obstructive sleep apnea Nonadherent to CPAP.  Counseled.  Counseled on diet and exercise to lose weight.  Chronic kidney disease stage III Stable.  Counseled to avoid NSAIDs.      Family Communication  : None   Disposition Plan  : Home home    Consults  :  Cardiology  Procedures  : 2D echo  Discharge Instructions   Allergies as of 02/24/2019       Reactions   Hydrocodone Nausea And Vomiting   Lisinopril Cough   Neurontin [gabapentin] Other (See Comments)   Reaction:  Suicidal thoughts    Statins Other (See Comments)   Reaction:  Muscle pain    Metformin And Related Diarrhea   Norvasc [amlodipine Besylate] Swelling, Other (See Comments)   Reaction:  Pedal edema      Medication List    STOP taking these medications   glimepiride 1 MG tablet Commonly known as: AMARYL   rosuvastatin 5 MG tablet Commonly known as: Crestor     TAKE these medications   acetaminophen 500 MG tablet Commonly known as: TYLENOL Take 1,500 mg by mouth every 6 (six) hours as needed for mild pain, fever or headache.   allopurinol 100 MG tablet Commonly known as: ZYLOPRIM Take one tablet by mouth daily What changed:   how much to take  how to take this  when to take this  reasons to take this  additional instructions   ALPRAZolam 0.5 MG tablet Commonly known as: XANAX TAKE 1/2-1 TABLET BY MOUTH AT BEDTIME AS NEEDED What changed:   how much to take  how to take this  when to take this  reasons to take this  additional instructions   aspirin 81 MG chewable tablet Chew 81 mg by mouth at bedtime.   fenofibrate 160 MG tablet TAKE 1 TABLET AT BEDTIME   glucose blood test strip Commonly known as: Accu-Chek SmartView TEST BLOOD SUGAR ONE TIME DAILY   Insulin Pen Needle 32G X 4 MM Misc Commonly known as: BD Pen Needle Nano U/F Use once daily as directed   isosorbide mononitrate 60 MG 24 hr tablet Commonly known as: IMDUR TAKE 1 TABLET (60 MG TOTAL) BY MOUTH DAILY.   Levemir FlexTouch 100 UNIT/ML Pen Generic drug: Insulin Detemir Inject 80 Units into the skin daily at 10 pm. What changed: how much to take   losartan 25 MG tablet Commonly known as: COZAAR Take 25 mg by mouth daily.   metoprolol tartrate 25 MG tablet Commonly known as: LOPRESSOR TAKE 1 TABLET TWICE DAILY   metroNIDAZOLE 500 MG tablet Commonly known  as: FLAGYL Take 1 tablet (500 mg total) by mouth 3 (three) times daily.   ofloxacin 0.3 % OTIC solution Commonly known as: FLOXIN PLACE 5 DROPS INTO THE LEFT EAR 2 (TWO) TIMES DAILY. What changed:   when to take this  reasons to take this   spironolactone 25 MG tablet Commonly known as: ALDACTONE Take 0.5 tablets (12.5 mg total) by mouth daily. Start taking on: February 25, 2019   torsemide 20 MG tablet Commonly known as: DEMADEX Take 2 tablets (40 mg total) by mouth daily. What changed:   how much to take  how to take this  when to take this  additional instructions   traMADol 50 MG tablet Commonly known as: ULTRAM TAKE 1 TABLET (50 MG TOTAL) BY MOUTH EVERY 6 (SIX) HOURS AS NEEDED What changed: See the new instructions.   VITAMIN D3 PO Take 1 capsule by mouth every other day.      Follow-up Information    Herminio Commons, MD Follow up.   Specialty: Cardiology Why: [place] office - keep appointment as previously scheduled on 7/7 as below. Contact information: [place] [address] [phone]  [name], MD Follow up in 2 week(s).   Specialty: Family Medicine Contact information: [place] [address] [phone]        Grace Isaac, MD. Schedule an appointment as soon as possible for a visit in 3 week(s).   Specialty: Cardiothoracic Surgery Contact information: [address] [place] [address] [phone]          Allergies  Allergen Reactions  . Hydrocodone Nausea And Vomiting  . Lisinopril Cough  . Neurontin [Gabapentin] Other (See Comments)    Reaction:  Suicidal thoughts   . Statins Other (See Comments)    Reaction:  Muscle pain   . Metformin And Related Diarrhea  . Norvasc [Amlodipine Besylate] Swelling and Other (See Comments)    Reaction:  Pedal edema      Procedures/Studies: Dg Chest Portable 1 View  Result Date:  02/22/2019 CLINICAL DATA:  Shortness of breath. Lower extremity swelling. Weight gain. EXAM: PORTABLE CHEST 1 VIEW COMPARISON:  Chest x-rays dated 10/16/2018, 10/02/2018 and 09/29/2018. FINDINGS: New central pulmonary vascular congestion and bilateral interstitial edema. Marked cardiomegaly. No pleural effusion or pneumothorax seen. Osseous structures about the chest are unremarkable. IMPRESSION: 1. New central pulmonary vascular congestion and bilateral interstitial edema indicating CHF/volume overload. 2. Marked cardiomegaly, stable to slightly more prominent than recent chest x-rays. Of note, a moderate to large pericardial effusion was present on chest CT of 09/11/2018 and the slightly more prominent appearance of the heart on today's exam may indicate an increase in this pericardial effusion. Consider ECHO for further characterization. Electronically Signed   By: Franki Cabot M.D.   On: 02/22/2019 11:55        Subjective: Reports breathing much better.  Put out another 2.5 L in past 24 hours.  Discharge Exam: Vitals:   02/24/19 0556 02/24/19 0846  BP: 138/62 (!) 149/73  Pulse: 63 63  Resp: 17   Temp:  (!) 97.5 F (36.4 C)  SpO2: 90% 96%   Vitals:   02/24/19 0555 02/24/19 0556 02/24/19 0603 02/24/19 0846  BP: 138/62 138/62  (!) 149/73  Pulse: 65 63  63  Resp: 17 17    Temp: 98 F (36.7 C)   (!) 97.5 F (36.4 C)  TempSrc: Oral   Oral  SpO2: 92% 90%  96%  Weight:   117.4 kg   Height:        General: Middle-aged morbidly obese male not in distress HEENT: Moist mucosa, supple neck Chest: Clear bilaterally CVs: Normal S1-S2, no murmurs GI: Soft, nondistended and nontender Musculoskeletal: Warm, trace pitting edema bilaterally    The results of significant diagnostics from this hospitalization (including imaging, microbiology, ancillary and laboratory) are listed below for reference.     Microbiology: Recent Results (from the past 240 hour(s))  SARS Coronavirus 2  (CEPHEID- Performed in [place] hospital lab), Hosp Order     Status: None   Collection Time: 02/22/19 11:28 AM   Specimen: Nasopharyngeal Swab  Result Value Ref Range Status   SARS Coronavirus 2 NEGATIVE NEGATIVE Final    Comment: (NOTE) If result is NEGATIVE SARS-CoV-2 target nucleic acids are NOT DETECTED. The SARS-CoV-2 RNA is generally detectable in upper and lower  respiratory specimens during the acute phase of infection. The lowest  concentration of SARS-CoV-2 viral copies this assay can detect is 250  copies / mL. A negative result does not preclude SARS-CoV-2 infection  and should not be used as the sole basis  for treatment or other  patient management decisions.  A negative result may occur with  improper specimen collection / handling, submission of specimen other  than nasopharyngeal swab, presence of viral mutation(s) within the  areas targeted by this assay, and inadequate number of viral copies  (<250 copies / mL). A negative result must be combined with clinical  observations, patient history, and epidemiological information. If result is POSITIVE SARS-CoV-2 target nucleic acids are DETECTED. The SARS-CoV-2 RNA is generally detectable in upper and lower  respiratory specimens dur ing the acute phase of infection.  Positive  results are indicative of active infection with SARS-CoV-2.  Clinical  correlation with patient history and other diagnostic information is  necessary to determine patient infection status.  Positive results do  not rule out bacterial infection or co-infection with other viruses. If result is PRESUMPTIVE POSTIVE SARS-CoV-2 nucleic acids MAY BE PRESENT.   A presumptive positive result was obtained on the submitted specimen  and confirmed on repeat testing.  While 2019 novel coronavirus  (SARS-CoV-2) nucleic acids may be present in the submitted sample  additional confirmatory testing may be necessary for epidemiological  and / or clinical  management purposes  to differentiate between  SARS-CoV-2 and other Sarbecovirus currently known to infect humans.  If clinically indicated additional testing with an alternate test  methodology [phone]) is advised. The SARS-CoV-2 RNA is generally  detectable in upper and lower respiratory sp ecimens during the acute  phase of infection. The expected result is Negative. Fact Sheet for Patients:  StrictlyIdeas.no Fact Sheet for Healthcare Providers: BankingDealers.co.za This test is not yet approved or cleared by the [place] FDA and has been authorized for detection and/or diagnosis of SARS-CoV-2 by FDA under an Emergency Use Authorization (EUA).  This EUA will remain in effect (meaning this test can be used) for the duration of the COVID-19 declaration under Section 564(b)(1) of the Act, 21 U.S.C. section 360bbb-3(b)(1), unless the authorization is terminated or revoked sooner. Performed at [facility], [address], [place], [place] [address]      Labs: BNP (last 3 results) Recent Labs    02/22/19 1020  BNP 481.8*   Basic Metabolic Panel: Recent Labs  Lab 02/22/19 1020 02/23/19 0629  NA 138 139  K 4.6 4.4  CL 101 102  CO2 26 28  GLUCOSE 124* 180*  BUN 40* 38*  CREATININE 1.52* 1.75*  CALCIUM 9.3 8.9  MG 1.9  --    Liver Function Tests: No results for input(s): AST, ALT, ALKPHOS, BILITOT, PROT, ALBUMIN in the last 168 hours. No results for input(s): LIPASE, AMYLASE in the last 168 hours. No results for input(s): AMMONIA in the last 168 hours. CBC: Recent Labs  Lab 02/22/19 1020 02/23/19 0629  WBC 5.5 6.1  NEUTROABS 3.7  --   HGB 11.8* 10.9*  HCT 36.6* 33.6*  MCV 95.3 93.6  PLT 164 143*   Cardiac Enzymes: No results for input(s): CKTOTAL, CKMB, CKMBINDEX, TROPONINI in the last 168 hours. BNP: Invalid input(s): POCBNP CBG: Recent Labs  Lab 02/23/19 1236 02/23/19 1723 02/23/19 2126 02/24/19 0602  02/24/19 1123  GLUCAP 239* 275* 369* 233* 182*   D-Dimer No results for input(s): DDIMER in the last 72 hours. Hgb A1c No results for input(s): HGBA1C in the last 72 hours. Lipid Profile No results for input(s): CHOL, HDL, LDLCALC, TRIG, CHOLHDL, LDLDIRECT in the last 72 hours. Thyroid function studies No results for input(s): TSH, T4TOTAL, T3FREE, THYROIDAB in the last 72 hours.  Invalid input(s): FREET3 Anemia work up No results for input(s): VITAMINB12, FOLATE, FERRITIN, TIBC, IRON, RETICCTPCT in the last 72 hours. Urinalysis    Component Value Date/Time   COLORURINE YELLOW 09/29/2018 1451   APPEARANCEUR CLEAR 09/29/2018 1451   LABSPEC 1.010 09/29/2018 1451   PHURINE 5.0 09/29/2018 1451   GLUCOSEU 150 (A) 09/29/2018 1451   HGBUR SMALL (A) 09/29/2018 1451   BILIRUBINUR NEGATIVE 09/29/2018 1451   KETONESUR NEGATIVE 09/29/2018 1451   PROTEINUR 100 (A) 09/29/2018 1451   UROBILINOGEN 0.2 01/21/2015 1053   NITRITE NEGATIVE 09/29/2018 1451   LEUKOCYTESUR NEGATIVE 09/29/2018 1451   Sepsis Labs Invalid input(s): PROCALCITONIN,  WBC,  LACTICIDVEN Microbiology Recent Results (from the past 240 hour(s))  SARS Coronavirus 2 (CEPHEID- Performed in [facility] hospital lab), Hosp Order     Status: None   Collection Time: 02/22/19 11:28 AM   Specimen: Nasopharyngeal Swab  Result Value Ref Range Status   SARS Coronavirus 2 NEGATIVE NEGATIVE Final    Comment: (NOTE) If result is NEGATIVE SARS-CoV-2 target nucleic acids are NOT DETECTED. The SARS-CoV-2 RNA is generally detectable in upper and lower  respiratory specimens during the acute phase of infection. The lowest  concentration of SARS-CoV-2 viral copies this assay can detect is 250  copies / mL. A negative result does not preclude SARS-CoV-2 infection  and should not be used as the sole basis for treatment or other  patient management decisions.  A negative result may occur with  improper specimen collection / handling,  submission of specimen other  than nasopharyngeal swab, presence of viral mutation(s) within the  areas targeted by this assay, and inadequate number of viral copies  (<250 copies / mL). A negative result must be combined with clinical  observations, patient history, and epidemiological information. If result is POSITIVE SARS-CoV-2 target nucleic acids are DETECTED. The SARS-CoV-2 RNA is generally detectable in upper and lower  respiratory specimens dur ing the acute phase of infection.  Positive  results are indicative of active infection with SARS-CoV-2.  Clinical  correlation with patient history and other diagnostic information is  necessary to determine patient infection status.  Positive results do  not rule out bacterial infection or co-infection with other viruses. If result is PRESUMPTIVE POSTIVE SARS-CoV-2 nucleic acids MAY BE PRESENT.   A presumptive positive result was obtained on the submitted specimen  and confirmed on repeat testing.  While 2019 novel coronavirus  (SARS-CoV-2) nucleic acids may be present in the submitted sample  additional confirmatory testing may be necessary for epidemiological  and / or clinical management purposes  to differentiate between  SARS-CoV-2 and other Sarbecovirus currently known to infect humans.  If clinically indicated additional testing with an alternate test  methodology [phone]) is advised. The SARS-CoV-2 RNA is generally  detectable in upper and lower respiratory sp ecimens during the acute  phase of infection. The expected result is Negative. Fact Sheet for Patients:  StrictlyIdeas.no Fact Sheet for Healthcare Providers: BankingDealers.co.za This test is not yet approved or cleared by the [place] FDA and has been authorized for detection and/or diagnosis of SARS-CoV-2 by FDA under an Emergency Use Authorization (EUA).  This EUA will remain in effect (meaning this test can be  used) for the duration of the COVID-19 declaration under Section 564(b)(1) of the Act, 21 U.S.C. section 360bbb-3(b)(1), unless the authorization is terminated or revoked sooner. Performed at [facility], [address], [place], [place] [address]      Time coordinating discharge: 35 minutes  SIGNED:   Louellen Molder, MD  [facility] 02/24/2019, 11:52 AM Pager   If 7PM-7AM, please contact night-coverage [email or web address] Password [email or web address]

## 2019-02-24 NOTE — Progress Notes (Signed)
Progress Note  Patient Name: Eugene [name] Date of Encounter: 02/24/2019  Primary Cardiologist: Kate Sable, MD   Subjective   Patient is feeling significantly better today, he is excited to go home.  Inpatient Medications    Scheduled Meds: . aspirin  81 mg Oral QHS  . enoxaparin (LOVENOX) injection  40 mg Subcutaneous Q24H  . fenofibrate  160 mg Oral QHS  . furosemide  60 mg Intravenous Q12H  . insulin aspart  0-20 Units Subcutaneous TID WC  . insulin aspart  0-5 Units Subcutaneous QHS  . insulin detemir  72 Units Subcutaneous Q2200  . isosorbide mononitrate  60 mg Oral Daily  . losartan  25 mg Oral Daily  . metoprolol tartrate  25 mg Oral Once  . metoprolol tartrate  25 mg Oral BID  . sodium chloride flush  3 mL Intravenous Q12H   Continuous Infusions: . sodium chloride     PRN Meds: sodium chloride, acetaminophen **OR** acetaminophen, albuterol, ALPRAZolam, labetalol, ofloxacin, ondansetron **OR** ondansetron (ZOFRAN) IV, polyethylene glycol, sodium chloride flush, traZODone   Vital Signs    Vitals:   02/24/19 0555 02/24/19 0556 02/24/19 0603 02/24/19 0846  BP: 138/62 138/62  (!) 149/73  Pulse: 65 63  63  Resp: 17 17    Temp: 98 F (36.7 C)   (!) 97.5 F (36.4 C)  TempSrc: Oral   Oral  SpO2: 92% 90%  96%  Weight:   117.4 kg   Height:        Intake/Output Summary (Last 24 hours) at 02/24/2019 0927 Last data filed at 02/24/2019 0854 Gross per 24 hour  Intake 770 ml  Output 3290 ml  Net -2520 ml   Last 3 Weights 02/24/2019 02/23/2019 02/22/2019  Weight (lbs) 258 lb 13.1 oz 257 lb 8 oz 261 lb 4.8 oz  Weight (kg) 117.4 kg 116.801 kg 118.525 kg      Telemetry    SR - Personally Reviewed  ECG    No new tracing- Personally Reviewed  Physical Exam   GEN: No acute distress.   Neck: No JVD Cardiac: RRR, no murmurs, rubs, or gallops.  Respiratory: Clear to auscultation bilaterally. GI: Soft, nontender, non-distended  MS:  Trivial edema  bilaterally; No deformity. Neuro:  Nonfocal  Psych: Normal affect   Labs    High Sensitivity Troponin:   Recent Labs  Lab 02/22/19 1020 02/22/19 1303  TROPONINIHS 46.00* 41.00*      Cardiac EnzymesNo results for input(s): TROPONINI in the last 168 hours. No results for input(s): TROPIPOC in the last 168 hours.   Chemistry Recent Labs  Lab 02/22/19 1020 02/23/19 0629  NA 138 139  K 4.6 4.4  CL 101 102  CO2 26 28  GLUCOSE 124* 180*  BUN 40* 38*  CREATININE 1.52* 1.75*  CALCIUM 9.3 8.9  GFRNONAA 48* 41*  GFRAA 56* 47*  ANIONGAP 11 9     Hematology Recent Labs  Lab 02/22/19 1020 02/23/19 0629  WBC 5.5 6.1  RBC 3.84* 3.59*  HGB 11.8* 10.9*  HCT 36.6* 33.6*  MCV 95.3 93.6  MCH 30.7 30.4  MCHC 32.2 32.4  RDW 14.4 14.3  PLT 164 143*    BNP Recent Labs  Lab 02/22/19 1020  BNP 344.0*     DDimer No results for input(s): DDIMER in the last 168 hours.   Radiology    Dg Chest Portable 1 View  Result Date: 02/22/2019 CLINICAL DATA:  Shortness of breath. Lower extremity swelling. Weight gain.  EXAM: PORTABLE CHEST 1 VIEW COMPARISON:  Chest x-rays dated 10/16/2018, 10/02/2018 and 09/29/2018. FINDINGS: New central pulmonary vascular congestion and bilateral interstitial edema. Marked cardiomegaly. No pleural effusion or pneumothorax seen. Osseous structures about the chest are unremarkable. IMPRESSION: 1. New central pulmonary vascular congestion and bilateral interstitial edema indicating CHF/volume overload. 2. Marked cardiomegaly, stable to slightly more prominent than recent chest x-rays. Of note, a moderate to large pericardial effusion was present on chest CT of 09/11/2018 and the slightly more prominent appearance of the heart on today's exam may indicate an increase in this pericardial effusion. Consider ECHO for further characterization. Electronically Signed   By: Franki Cabot M.D.   On: 02/22/2019 11:55    Cardiac Studies     Patient Profile     63  y.o. male  with a hx of CAD (DES to PDA/RCA in 2012,STEMI in 2014 with DES to [facility]), chronicdiastolicCHF, pericardial effusion (s/p pericardial window in 2014, redo window in 09/2018 for recurrent effusion), HTN, HLD(intolerant of statins, previously unable to fill Zetia 2/2 financial restrictions), CKDstage III (baseline 2),DM c/b peripheral enuropathy, OSA (does not use CPAP), GERD, COPD, chronic-appearing anemia who is being seen today for the evaluation of CHF and pericardial effusion.  Patient has not been compliant with low-sodium diet, he presented with 10 pound weight gain,  there is slight worsening of moderate circumferential pericardial effusion to the was seen previously on echocardiogram in October 2019, chest x-ray shows pulmonary edema. He was noted on IV diuresis with Lasix 60 mg IV twice daily, he is -3.4 L overnight with worsening creatinine from 1.5-1.75 however baseline anywhere up to 2.3, baseline weight 113 kg currently 116 kg, I would call continue same dose of IV Lasix till tomorrow follow creatinine and clinical status.  The patient was significantly hypertensive on admission, his blood pressures are normal today, will follow and uptitrate as needed. He underwent redo subxiphoid window in 09/2018. The cytopathology read, "PERICARDIAL FLUID (SPECIMEN 1 OF 1 COLLECTED 10-01-2018). REACTIVE APPEARING MESOTHELIAL CELLS. COMMENT: THE MESOTHELIAL CELLS ARE POSITIVE FOR CYTOKERATIN 5/6 AND WT-1 AND FOCALLY POSITIVE FOR CALRETININ. THEY ARE NEGATIVE FOR MOC-31." The pericardial sac biopsy was negative for malignancy. He saw TCTS back but cancelled f/u with cardiology in 11/2018.  This was discussed with nephrology was stated that these are benign findings.  Assessment & Plan    1. Recurrent pericardial effusion (s/p window x 2) - pathology benign from fluid and cas analysis No need for another window right now.  Current pericardial effusion seems to be related to acute on chronic diastolic  CHF secondary to noncompliance.  He is educated and motivated to change his eating habits and salt intake.  2. Acute on chronic diastolic CHF - likely driven by eating canned goods. Reviewed 2g sodium restriction, 2L fluid restriction, daily weights with patient. V We will discharge on torsemide 40 mg daily, will arrange for follow-up next week with labs including BMP and BNP.  I will add spironolactone 12.5 mg daily.  3. CKD stage III - Cr stable in fact improved from prior. Suspect will trend closer to baseline of 2 with euvolemia.  4. CAD s/p CABG - no recurrent angina. LVEF preserved. Troponins flat c/w demand process, no ACS.  5. HTN - BP initially elevated but trending down. Follow.   [facility] will sign off.   Medication Recommendations:  As above Other recommendations (labs, testing, etc): No Further testing Follow up as an outpatient: We will arrange  For questions  or updates, please contact [place] Please consult [email or web address] for contact info under     Signed, Ena Dawley, MD  02/24/2019, 9:27 AM

## 2019-02-24 NOTE — Consult Note (Signed)
[facility] CM Inpatient Consult   02/24/2019  Eugene [name] Aug 24, 1956 [id number]   Follow up:  Spoke with the patient via hospital phone line, HIPAA verified, and regarding medications. He has [employer].   Patient states his medication issue has been resolved.  He states he appreciated the help.  He denies any needs for post hospital follow up needs.  He has transportation, medication needs are met and did not feel he needed additional resources.  States, "I have y'alls information if anything comes about."  He is in agreement for a "few of automated calls."   Will have General EMMI calls for follow up.  For questions, please contact:  Natividad Brood, RN BSN [name]  [phone] business mobile phone Toll free office [phone]  Fax number: [phone] [email or web address]

## 2019-02-25 NOTE — Telephone Encounter (Signed)
Insulin arrived at office today and was picked up by patient.

## 2019-02-26 NOTE — Telephone Encounter (Signed)
Pt says he is doing fine from recent d/c 6/28 - pt denies any complaints/concerns at this time since d/c - no questions or concerns regarding medications. Has f/u with Dr Bronson Ing on 03/03/19.

## 2019-03-03 ENCOUNTER — Encounter: Payer: Self-pay | Admitting: Cardiovascular Disease

## 2019-03-03 ENCOUNTER — Ambulatory Visit (INDEPENDENT_AMBULATORY_CARE_PROVIDER_SITE_OTHER): Payer: Medicare HMO | Admitting: Cardiovascular Disease

## 2019-03-03 ENCOUNTER — Other Ambulatory Visit: Payer: Self-pay | Admitting: Pharmacy Technician

## 2019-03-03 ENCOUNTER — Other Ambulatory Visit: Payer: Self-pay

## 2019-03-03 VITALS — BP 160/88 | HR 74 | Temp 97.1°F | Ht 73.0 in | Wt 254.0 lb

## 2019-03-03 DIAGNOSIS — N183 Chronic kidney disease, stage 3 unspecified: Secondary | ICD-10-CM

## 2019-03-03 DIAGNOSIS — I5032 Chronic diastolic (congestive) heart failure: Secondary | ICD-10-CM | POA: Diagnosis not present

## 2019-03-03 DIAGNOSIS — E785 Hyperlipidemia, unspecified: Secondary | ICD-10-CM | POA: Diagnosis not present

## 2019-03-03 DIAGNOSIS — I3139 Other pericardial effusion (noninflammatory): Secondary | ICD-10-CM

## 2019-03-03 DIAGNOSIS — I25708 Atherosclerosis of coronary artery bypass graft(s), unspecified, with other forms of angina pectoris: Secondary | ICD-10-CM | POA: Diagnosis not present

## 2019-03-03 DIAGNOSIS — I10 Essential (primary) hypertension: Secondary | ICD-10-CM

## 2019-03-03 DIAGNOSIS — I313 Pericardial effusion (noninflammatory): Secondary | ICD-10-CM | POA: Diagnosis not present

## 2019-03-03 MED ORDER — ROSUVASTATIN CALCIUM 5 MG PO TABS: ORAL_TABLET | ORAL | Status: DC

## 2019-03-03 NOTE — Progress Notes (Signed)
SUBJECTIVE: The patient presents for a transition of care visit.  He was hospitalized and discharged on 02/24/2019 for recurrent pericardial effusion and acute on chronic diastolic heart failure deemed secondary to noncompliance with eating habits and sodium intake.  I interpreted his echocardiogram on 02/22/2019 which demonstrated EF 50%, restrictive diastolic dysfunction with elevated LVEDP, moderate sized pericardial effusion with no tamponade, severe left atrial dilatation, and mitral and aortic annular calcification.  BUN 38, creatinine 1.75 on 02/23/2019.  He diuresed approximately 5 L.  He was discharged on torsemide 40 mg daily along with ARB, metoprolol and Imdur.  Low-dose spironolactone was also added.  It is felt that pericardial sac biopsy demonstrated benign and reactive, nonmalignant cells.  He is feeling very well and denies chest pain, palpitations, leg swelling, orthopnea, and shortness of breath.  Prior to the pandemic, he had been exercising in the pool at the [facility] 3 days/week and was walking up to a mile on the track.  He normally is very careful about his sodium intake but prior to his hospitalization, he started eating canned foods and did not check the sodium content.  He checks his blood pressure and it runs in the 118-120/76 range at home.  He said it is always elevated when he comes to our office.   Review of Systems: As per "subjective", otherwise negative.  Allergies  Allergen Reactions  . Hydrocodone Nausea And Vomiting  . Lisinopril Cough  . Neurontin [Gabapentin] Other (See Comments)    Reaction:  Suicidal thoughts   . Statins Other (See Comments)    Reaction:  Muscle pain   . Metformin And Related Diarrhea  . Norvasc [Amlodipine Besylate] Swelling and Other (See Comments)    Reaction:  Pedal edema    Current Outpatient Medications  Medication Sig Dispense Refill  . acetaminophen (TYLENOL) 500 MG tablet Take 1,500 mg by mouth every 6 (six)  hours as needed for mild pain, fever or headache.     . allopurinol (ZYLOPRIM) 100 MG tablet Take one tablet by mouth daily (Patient taking differently: Take 100 mg by mouth daily as needed (for gout flare up). ) 90 tablet 1  . ALPRAZolam (XANAX) 0.5 MG tablet TAKE 1/2-1 TABLET BY MOUTH AT BEDTIME AS NEEDED (Patient taking differently: Take 0.25-0.5 mg by mouth at bedtime as needed for anxiety. ) 30 tablet 5  . aspirin 81 MG chewable tablet Chew 81 mg by mouth at bedtime.    . Cholecalciferol (VITAMIN D3 PO) Take 1 capsule by mouth every other day.    [name] glucose blood (ACCU-CHEK SMARTVIEW) test strip TEST BLOOD SUGAR ONE TIME DAILY 50 each 5  . Insulin Detemir (LEVEMIR FLEXTOUCH) 100 UNIT/ML Pen Inject 80 Units into the skin daily at 10 pm. (Patient taking differently: Inject 72 Units into the skin daily at 10 pm. ) 10 pen 5  . Insulin Pen Needle (BD PEN NEEDLE NANO U/F) 32G X 4 MM MISC Use once daily as directed 100 each 0  . isosorbide mononitrate (IMDUR) 60 MG 24 hr tablet TAKE 1 TABLET (60 MG TOTAL) BY MOUTH DAILY. 90 tablet 3  . losartan (COZAAR) 25 MG tablet Take 25 mg by mouth daily.     . metoprolol tartrate (LOPRESSOR) 25 MG tablet TAKE 1 TABLET TWICE DAILY (Patient taking differently: Take 25 mg by mouth 2 (two) times daily. ) 180 tablet 0  . ofloxacin (FLOXIN) 0.3 % OTIC solution PLACE 5 DROPS INTO THE LEFT EAR 2 (TWO) TIMES  DAILY. (Patient taking differently: Place 5 drops into the left ear daily as needed. ) 10 mL 0  . spironolactone (ALDACTONE) 25 MG tablet Take 0.5 tablets (12.5 mg total) by mouth daily. 30 tablet 0  . torsemide (DEMADEX) 20 MG tablet Take 2 tablets (40 mg total) by mouth daily. 90 tablet 1  . traMADol (ULTRAM) 50 MG tablet TAKE 1 TABLET (50 MG TOTAL) BY MOUTH EVERY 6 (SIX) HOURS AS NEEDED (Patient taking differently: Take 50 mg by mouth daily as needed. ) 24 tablet 3   No current facility-administered medications for this visit.     Past Medical History:  Diagnosis  Date  . Anemia   . Anxiety   . Arteriosclerotic cardiovascular disease (ASCVD)    a. 05/2011 s/p DES to PDA and RCA. b. 08/2012 Inflat  STEMI/Cath/PCI: LM minor irregs, LAD 50p, D1 50, LCX nl, OM1 25, RCA 30-40p, 100d (treated with 2.75x64mm Promus Premier DES);    [name] Asthma   . Bell palsy   . C. difficile colitis    a. 08/2012  . Cholelithiasis 07/2012   Asymptomatic; identified incidentally  . Chronic diastolic CHF (congestive heart failure) ([place])   . CKD (chronic kidney disease), stage III ([place])   . Contrast dye induced nephropathy    a. 08/2012 post cath/pci  . COPD (chronic obstructive pulmonary disease) ([place])   . Diabetes mellitus    Peripheral neuropathy  . Gallstones   . GERD (gastroesophageal reflux disease)   . Hyperlipidemia   . Hypertension   . Myocardial infarct ([place]) 09/08/12  . Nephrolithiasis   . Old myocardial infarction   . Pericardial effusion    a. s/p window in 2014. b. s/p redo window in 09/2018.  [name] Peripheral neuropathy   . PONV (postoperative nausea and vomiting)   . Sleep apnea   . Statin intolerance     Past Surgical History:  Procedure Laterality Date  . CARDIAC CATHETERIZATION    . CHEST TUBE INSERTION    . CIRCUMCISION    . COLONOSCOPY     In [facility], approximately 2011 per patient, was normal. Advised to come back in 10 years.  . ESOPHAGOGASTRODUODENOSCOPY     in [place] over 20 yrs ago  . ESOPHAGOGASTRODUODENOSCOPY  06/12/2012   DGL:OVFIEP esophagus-status post Venia Minks dilation. Abnormal gastric mucosa of uncertain significance-status post biopsy  . INTRAOPERATIVE TRANSESOPHAGEAL ECHOCARDIOGRAM N/A 06/23/2013   Procedure: INTRAOPERATIVE TRANSESOPHAGEAL ECHOCARDIOGRAM;  Surgeon: Grace Isaac, MD;  Location: [place];  Service: Open Heart Surgery;  Laterality: N/A;  . LEFT HEART CATHETERIZATION WITH CORONARY ANGIOGRAM N/A 09/08/2012   Procedure: LEFT HEART CATHETERIZATION WITH CORONARY ANGIOGRAM;  Surgeon: [name], MD;   Location: [facility] CATH LAB;  Service: Cardiovascular;  Laterality: N/A;  . stents    . SUBXYPHOID PERICARDIAL WINDOW N/A 06/23/2013   Procedure: SUBXYPHOID PERICARDIAL WINDOW;  Surgeon: Grace Isaac, MD;  Location: [place];  Service: Thoracic;  Laterality: N/A;  . SUBXYPHOID PERICARDIAL WINDOW  10/01/2018  . SUBXYPHOID PERICARDIAL WINDOW N/A 10/01/2018   Procedure: REDO SUBXYPHOID PERICARDIAL WINDOW;  Surgeon: Grace Isaac, MD;  Location: [place];  Service: Thoracic;  Laterality: N/A;  . TEE WITHOUT CARDIOVERSION N/A 10/01/2018   Procedure: TRANSESOPHAGEAL ECHOCARDIOGRAM (TEE);  Surgeon: Grace Isaac, MD;  Location: [facility] OR;  Service: Thoracic;  Laterality: N/A;    Social History   Socioeconomic History  . Marital status: Married    Spouse name: Not on file  . Number of children: 2  .  Years of education: Not on file  . Highest education level: Not on file  Occupational History  . Occupation: Personal assistant: [employer]    Comment: Scientist, physiological  . Financial resource strain: Not on file  . Food insecurity    Worry: Not on file    Inability: Not on file  . Transportation needs    Medical: Not on file    Non-medical: Not on file  Tobacco Use  . Smoking status: Never Smoker  . Smokeless tobacco: Never Used  Substance and Sexual Activity  . Alcohol use: No    Alcohol/week: 0.0 standard drinks    Comment: heavy etoh use 30 years ago  . Drug use: No  . Sexual activity: Yes    Birth control/protection: None  Lifestyle  . Physical activity    Days per week: Not on file    Minutes per session: Not on file  . Stress: Not on file  Relationships  . Social Herbalist on phone: Not on file    Gets together: Not on file    Attends religious service: Not on file    Active member of club or organization: Not on file    Attends meetings of clubs or organizations: Not on file    Relationship status: Not on file  . Intimate partner violence     Fear of current or ex partner: Not on file    Emotionally abused: Not on file    Physically abused: Not on file    Forced sexual activity: Not on file  Other Topics Concern  . 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 Disabled at this point.     Vitals:   03/03/19 1123  BP: (!) 160/88  Pulse: 74  Temp: (!) 97.1 F (36.2 C)  SpO2: 97%  Weight: 254 lb (115.2 kg)  Height: 6\' 1"  (1.854 m)    Wt Readings from Last 3 Encounters:  03/03/19 254 lb (115.2 kg)  02/24/19 258 lb 13.1 oz (117.4 kg)  10/16/18 243 lb (110.2 kg)     PHYSICAL EXAM General: NAD HEENT: Normal. Neck: No JVD, no thyromegaly. Lungs: Clear to auscultation bilaterally with normal respiratory effort. CV: Regular rate and rhythm, normal S1/S2, no S3/S4, no murmur. No pretibial or periankle edema.  No carotid bruit.   Abdomen: Soft, nontender, no distention.  Neurologic: Alert and oriented.  Psych: Normal affect. Skin: Normal. Musculoskeletal: No gross deformities.    ECG: Reviewed above under Subjective   Labs: Lab Results  Component Value Date/Time   K 4.4 02/23/2019 06:29 AM   BUN 38 (H) 02/23/2019 06:29 AM   BUN 61 (H) 07/28/2018 11:56 AM   CREATININE 1.75 (H) 02/23/2019 06:29 AM   CREATININE 1.26 10/26/2014 03:29 PM   ALT 17 10/03/2018 02:20 AM   TSH 1.585 11/02/2016 09:58 PM   TSH 1.780 10/26/2014 03:25 PM   HGB 10.9 (L) 02/23/2019 06:29 AM   HGB 11.2 (L) 11/27/2016 02:10 PM     Lipids: Lab Results  Component Value Date/Time   LDLCALC 125 (H) 01/01/2019 11:58 AM   CHOL 224 (H) 01/01/2019 11:58 AM   TRIG 338 (H) 01/01/2019 11:58 AM   HDL 31 (L) 01/01/2019 11:58 AM       ASSESSMENT AND PLAN: 1.  Chronic diastolic heart failure: Euvolemic on torsemide 40 mg daily along with spironolactone 12.5 mg.  Chronic kidney  disease stage III stable.  Weight 254 pounds.    2.  Recurrent pericardial effusion status post window x2: Pericardial sac  biopsy was negative for malignancy.  The cells were benign/reactive in nature and not felt to be indicative of malignancy.  3.  Coronary disease status post CABG: No recurrent angina.  LVEF 50%.  Continue medical therapy with aspirin, Imdur, and Lopressor.  4.  Hypertension: He checks his blood pressure and it runs in the 118-120/76 range at home.  He said it is always elevated when he comes to our office.  No changes to therapy.  5.  Chronic kidney disease stage III: BUN 38, creatinine 1.75 on 02/23/2019.  6.  Hyperlipidemia: He is now taking Crestor 5 mg two days/week.  LDL 125 on 01/01/2019.   Disposition: Follow up 4 months   Kate Sable, M.D., F.A.C.C.

## 2019-03-03 NOTE — Patient Outreach (Signed)
[facility]) Care Management  03/03/2019  [name] [birth year] [id number]    Successful outreach call placed to patient in regards to [employer] application for Levemir.  Spoke to patient, HIPAA identifiers verified.  Patient informed he picked up 6 boxes from the provider's office. Discussed refill procedure with patient. Patient verbalized understanding.  Patient denied having any other questions or concerns at this time. Confirmed patient had name and number.  Will route note to [place] that patient assistance has been completed and will remove myself from care team.  Eugene [name]. Eugene [name], [place] [facility] [phone]

## 2019-03-03 NOTE — Patient Instructions (Signed)
Medication Instructions:  Continue all current medications.  Labwork: none  Testing/Procedures: none  Follow-Up: 4 months   Any Other Special Instructions Will Be Listed Below (If Applicable).  If you need a refill on your cardiac medications before your next appointment, please call your pharmacy.\ 

## 2019-03-04 ENCOUNTER — Encounter: Payer: Self-pay | Admitting: Podiatry

## 2019-03-04 ENCOUNTER — Other Ambulatory Visit: Payer: Self-pay

## 2019-03-04 ENCOUNTER — Other Ambulatory Visit: Payer: Self-pay | Admitting: Pharmacist

## 2019-03-04 ENCOUNTER — Ambulatory Visit: Payer: Medicare HMO | Admitting: Podiatry

## 2019-03-04 VITALS — Temp 97.6°F

## 2019-03-04 DIAGNOSIS — M79674 Pain in right toe(s): Secondary | ICD-10-CM | POA: Diagnosis not present

## 2019-03-04 DIAGNOSIS — M79675 Pain in left toe(s): Secondary | ICD-10-CM

## 2019-03-04 DIAGNOSIS — E1142 Type 2 diabetes mellitus with diabetic polyneuropathy: Secondary | ICD-10-CM | POA: Diagnosis not present

## 2019-03-04 DIAGNOSIS — S233XXA Sprain of ligaments of thoracic spine, initial encounter: Secondary | ICD-10-CM | POA: Diagnosis not present

## 2019-03-04 DIAGNOSIS — B351 Tinea unguium: Secondary | ICD-10-CM | POA: Diagnosis not present

## 2019-03-04 DIAGNOSIS — S134XXA Sprain of ligaments of cervical spine, initial encounter: Secondary | ICD-10-CM | POA: Diagnosis not present

## 2019-03-04 DIAGNOSIS — M9902 Segmental and somatic dysfunction of thoracic region: Secondary | ICD-10-CM | POA: Diagnosis not present

## 2019-03-04 DIAGNOSIS — M9903 Segmental and somatic dysfunction of lumbar region: Secondary | ICD-10-CM | POA: Diagnosis not present

## 2019-03-04 DIAGNOSIS — M9901 Segmental and somatic dysfunction of cervical region: Secondary | ICD-10-CM | POA: Diagnosis not present

## 2019-03-04 DIAGNOSIS — S335XXA Sprain of ligaments of lumbar spine, initial encounter: Secondary | ICD-10-CM | POA: Diagnosis not present

## 2019-03-04 NOTE — Patient Instructions (Signed)
Diabetes Mellitus and Foot Care Foot care is an important part of your health, especially when you have diabetes. Diabetes may cause you to have problems because of poor blood flow (circulation) to your feet and legs, which can cause your skin to:  Become thinner and drier.  Break more easily.  Heal more slowly.  Peel and crack. You may also have nerve damage (neuropathy) in your legs and feet, causing decreased feeling in them. This means that you may not notice minor injuries to your feet that could lead to more serious problems. Noticing and addressing any potential problems early is the best way to prevent future foot problems. How to care for your feet Foot hygiene  Wash your feet daily with warm water and mild soap. Do not use hot water. Then, pat your feet and the areas between your toes until they are completely dry. Do not soak your feet as this can dry your skin.  Trim your toenails straight across. Do not dig under them or around the cuticle. File the edges of your nails with an emery board or nail file.  Apply a moisturizing lotion or petroleum jelly to the skin on your feet and to dry, brittle toenails. Use lotion that does not contain alcohol and is unscented. Do not apply lotion between your toes. Shoes and socks  Wear clean socks or stockings every day. Make sure they are not too tight. Do not wear knee-high stockings since they may decrease blood flow to your legs.  Wear shoes that fit properly and have enough cushioning. Always look in your shoes before you put them on to be sure there are no objects inside.  To break in new shoes, wear them for just a few hours a day. This prevents injuries on your feet. Wounds, scrapes, corns, and calluses  Check your feet daily for blisters, cuts, bruises, sores, and redness. If you cannot see the bottom of your feet, use a mirror or ask someone for help.  Do not cut corns or calluses or try to remove them with medicine.  If you  find a minor scrape, cut, or break in the skin on your feet, keep it and the skin around it clean and dry. You may clean these areas with mild soap and water. Do not clean the area with peroxide, alcohol, or iodine.  If you have a wound, scrape, corn, or callus on your foot, look at it several times a day to make sure it is healing and not infected. Check for: ? Redness, swelling, or pain. ? Fluid or blood. ? Warmth. ? Pus or a bad smell. General instructions  Do not cross your legs. This may decrease blood flow to your feet.  Do not use heating pads or hot water bottles on your feet. They may burn your skin. If you have lost feeling in your feet or legs, you may not know this is happening until it is too late.  Protect your feet from hot and cold by wearing shoes, such as at the beach or on hot pavement.  Schedule a complete foot exam at least once a year (annually) or more often if you have foot problems. If you have foot problems, report any cuts, sores, or bruises to your health care provider immediately. Contact a health care provider if:  You have a medical condition that increases your risk of infection and you have any cuts, sores, or bruises on your feet.  You have an injury that is not   healing.  You have redness on your legs or feet.  You feel burning or tingling in your legs or feet.  You have pain or cramps in your legs and feet.  Your legs or feet are numb.  Your feet always feel cold.  You have pain around a toenail. Get help right away if:  You have a wound, scrape, corn, or callus on your foot and: ? You have pain, swelling, or redness that gets worse. ? You have fluid or blood coming from the wound, scrape, corn, or callus. ? Your wound, scrape, corn, or callus feels warm to the touch. ? You have pus or a bad smell coming from the wound, scrape, corn, or callus. ? You have a fever. ? You have a red line going up your leg. Summary  Check your feet every day  for cuts, sores, red spots, swelling, and blisters.  Moisturize feet and legs daily.  Wear shoes that fit properly and have enough cushioning.  If you have foot problems, report any cuts, sores, or bruises to your health care provider immediately.  Schedule a complete foot exam at least once a year (annually) or more often if you have foot problems. This information is not intended to replace advice given to you by your health care provider. Make sure you discuss any questions you have with your health care provider. Document Released: 08/10/2000 Document Revised: 09/25/2017 Document Reviewed: 09/14/2016 Elsevier Patient Education  2020 Elsevier Inc.  

## 2019-03-04 NOTE — Patient Outreach (Signed)
[place] [facility]) Care Management  03/04/2019  [place] 1955-10-08 [id number]   Receive InBasket message from [place] Simcox letting me know patient approved for [employer] patient assistance for Levemir, received medication, understood refill procedure and denies further questions/concerns at this time.  Will close pharmacy episode at this time.  Harlow Asa, PharmD, [place] Management [phone]

## 2019-03-05 NOTE — Progress Notes (Signed)
Subjective: Eugene [name] presents today with painful, thick toenails 1-5 b/l that he cannot cut and which interfere with daily activities.  Pain is aggravated when wearing enclosed shoe gear.  Patient states he has been using Foot Miracle Cream and his feet look much better.   [name], MD is his PCP.    Current Outpatient Medications:  .  acetaminophen (TYLENOL) 500 MG tablet, Take 1,500 mg by mouth every 6 (six) hours as needed for mild pain, fever or headache. , Disp: , Rfl:  .  allopurinol (ZYLOPRIM) 100 MG tablet, Take one tablet by mouth daily (Patient taking differently: Take 100 mg by mouth daily as needed (for gout flare up). ), Disp: 90 tablet, Rfl: 1 .  ALPRAZolam (XANAX) 0.5 MG tablet, TAKE 1/2-1 TABLET BY MOUTH AT BEDTIME AS NEEDED (Patient taking differently: Take 0.25-0.5 mg by mouth at bedtime as needed for anxiety. ), Disp: 30 tablet, Rfl: 5 .  aspirin 81 MG chewable tablet, Chew 81 mg by mouth at bedtime., Disp: , Rfl:  .  Cholecalciferol (VITAMIN D3 PO), Take 1 capsule by mouth every other day., Disp: , Rfl:  .  glucose blood (ACCU-CHEK SMARTVIEW) test strip, TEST BLOOD SUGAR ONE TIME DAILY, Disp: 50 each, Rfl: 5 .  Insulin Detemir (LEVEMIR FLEXTOUCH) 100 UNIT/ML Pen, Inject 80 Units into the skin daily at 10 pm. (Patient taking differently: Inject 72 Units into the skin daily at 10 pm. ), Disp: 10 pen, Rfl: 5 .  Insulin Pen Needle (BD PEN NEEDLE NANO U/F) 32G X 4 MM MISC, Use once daily as directed, Disp: 100 each, Rfl: 0 .  isosorbide mononitrate (IMDUR) 60 MG 24 hr tablet, TAKE 1 TABLET (60 MG TOTAL) BY MOUTH DAILY., Disp: 90 tablet, Rfl: 3 .  losartan (COZAAR) 25 MG tablet, Take 25 mg by mouth daily. , Disp: , Rfl:  .  metoprolol tartrate (LOPRESSOR) 25 MG tablet, TAKE 1 TABLET TWICE DAILY (Patient taking differently: Take 25 mg by mouth 2 (two) times daily. ), Disp: 180 tablet, Rfl: 0 .  ofloxacin (FLOXIN) 0.3 % OTIC solution, PLACE 5 DROPS INTO THE LEFT EAR  2 (TWO) TIMES DAILY. (Patient taking differently: Place 5 drops into the left ear daily as needed. ), Disp: 10 mL, Rfl: 0 .  rosuvastatin (CRESTOR) 5 MG tablet, Take one tab by mouth two days a week, Disp: , Rfl:  .  spironolactone (ALDACTONE) 25 MG tablet, Take 0.5 tablets (12.5 mg total) by mouth daily., Disp: 30 tablet, Rfl: 0 .  torsemide (DEMADEX) 20 MG tablet, Take 2 tablets (40 mg total) by mouth daily., Disp: 90 tablet, Rfl: 1 .  traMADol (ULTRAM) 50 MG tablet, TAKE 1 TABLET (50 MG TOTAL) BY MOUTH EVERY 6 (SIX) HOURS AS NEEDED (Patient taking differently: Take 50 mg by mouth daily as needed. ), Disp: 24 tablet, Rfl: 3  Allergies  Allergen Reactions  . Hydrocodone Nausea And Vomiting  . Lisinopril Cough  . Neurontin [Gabapentin] Other (See Comments)    Reaction:  Suicidal thoughts   . Statins Other (See Comments)    Reaction:  Muscle pain   . Metformin And Related Diarrhea  . Norvasc [Amlodipine Besylate] Swelling and Other (See Comments)    Reaction:  Pedal edema    Objective: Vitals:   03/04/19 1442  Temp: 97.6 F (36.4 C)    Vascular Examination: Capillary refill time immediate x 10 digits.  Dorsalis pedis and Posterior tibial pulses palpable b/l.  Digital hair present x  10 digits.  Skin temperature gradient WNL b/l.  Dermatological Examination: Skin with normal turgor, texture and tone b/l.  Toenails 1-5 b/l discolored, thick, dystrophic with subungual debris and pain with palpation to nailbeds due to thickness of nails.  Resolved hyperkeratotic lesion submet head 3 left foot.  Musculoskeletal: Muscle strength 5/5 to all LE muscle groups.  Neurological: Sensation absent with 10 gram monofilament.  Assessment: Painful onychomycosis toenails 1-5 b/l  NIDDM with neuropathy  Plan: 1. Toenails 1-5 b/l were debrided in length and girth without iatrogenic bleeding. 2. Patient to continue soft, supportive shoe gear daily. 3. Patient to report any pedal  injuries to medical professional immediately. 4. Follow up 3 months.  5. Patient/POA to call should there be a concern in the interim.

## 2019-03-09 ENCOUNTER — Other Ambulatory Visit: Payer: Self-pay | Admitting: Family Medicine

## 2019-03-09 DIAGNOSIS — N183 Chronic kidney disease, stage 3 (moderate): Secondary | ICD-10-CM | POA: Diagnosis not present

## 2019-03-09 DIAGNOSIS — E559 Vitamin D deficiency, unspecified: Secondary | ICD-10-CM | POA: Diagnosis not present

## 2019-03-09 DIAGNOSIS — D509 Iron deficiency anemia, unspecified: Secondary | ICD-10-CM | POA: Diagnosis not present

## 2019-03-09 DIAGNOSIS — R809 Proteinuria, unspecified: Secondary | ICD-10-CM | POA: Diagnosis not present

## 2019-03-09 DIAGNOSIS — Z79899 Other long term (current) drug therapy: Secondary | ICD-10-CM | POA: Diagnosis not present

## 2019-03-09 DIAGNOSIS — I10 Essential (primary) hypertension: Secondary | ICD-10-CM | POA: Diagnosis not present

## 2019-03-12 ENCOUNTER — Other Ambulatory Visit: Payer: Self-pay

## 2019-03-12 ENCOUNTER — Other Ambulatory Visit: Payer: Medicare HMO | Admitting: Orthotics

## 2019-03-13 ENCOUNTER — Other Ambulatory Visit: Payer: Self-pay | Admitting: Cardiovascular Disease

## 2019-03-16 ENCOUNTER — Other Ambulatory Visit: Payer: Self-pay | Admitting: Family Medicine

## 2019-04-06 ENCOUNTER — Other Ambulatory Visit: Payer: Self-pay | Admitting: Family Medicine

## 2019-04-06 NOTE — Telephone Encounter (Signed)
May have 1 refill of each with 1 refill each needs follow-up office visit by October

## 2019-05-05 ENCOUNTER — Other Ambulatory Visit: Payer: Self-pay | Admitting: Family Medicine

## 2019-05-10 ENCOUNTER — Other Ambulatory Visit: Payer: Self-pay | Admitting: Family Medicine

## 2019-05-12 ENCOUNTER — Ambulatory Visit (INDEPENDENT_AMBULATORY_CARE_PROVIDER_SITE_OTHER): Payer: Medicare HMO | Admitting: Family Medicine

## 2019-05-12 ENCOUNTER — Other Ambulatory Visit: Payer: Self-pay

## 2019-05-12 ENCOUNTER — Telehealth: Payer: Self-pay | Admitting: *Deleted

## 2019-05-12 ENCOUNTER — Other Ambulatory Visit: Payer: Self-pay | Admitting: Pharmacy Technician

## 2019-05-12 DIAGNOSIS — D6489 Other specified anemias: Secondary | ICD-10-CM | POA: Diagnosis not present

## 2019-05-12 DIAGNOSIS — I5032 Chronic diastolic (congestive) heart failure: Secondary | ICD-10-CM

## 2019-05-12 DIAGNOSIS — E0865 Diabetes mellitus due to underlying condition with hyperglycemia: Secondary | ICD-10-CM

## 2019-05-12 DIAGNOSIS — Z794 Long term (current) use of insulin: Secondary | ICD-10-CM

## 2019-05-12 DIAGNOSIS — E7849 Other hyperlipidemia: Secondary | ICD-10-CM | POA: Diagnosis not present

## 2019-05-12 DIAGNOSIS — G4733 Obstructive sleep apnea (adult) (pediatric): Secondary | ICD-10-CM | POA: Diagnosis not present

## 2019-05-12 MED ORDER — ALPRAZOLAM 0.5 MG PO TABS: ORAL_TABLET | ORAL | 5 refills | Status: DC

## 2019-05-12 NOTE — Telephone Encounter (Signed)
Tommy Rainwater returned call and stated to call [phone]

## 2019-05-12 NOTE — Telephone Encounter (Signed)
Refuse this medicine Patient does need to do follow-up visit this fall this can be a virtual visit

## 2019-05-12 NOTE — Telephone Encounter (Signed)
Pt had virtual visit today and dr Nicki Reaper wanted to refill levemir flex touch through prescription assistance program. See note from 03/04/19 in epic. I called and left a message with Tommy Rainwater [phone] to find out what I need to do to get the patient his medication.

## 2019-05-12 NOTE — Progress Notes (Signed)
Subjective:    Patient ID: Eugene [name], male    DOB: Dec 02, 1955, 63 y.o.   MRN: [id number] Virtual visit Patient unable to do video Diabetes He presents for his follow-up diabetic visit. He has type 2 diabetes mellitus. Pertinent negatives for hypoglycemia include no confusion, dizziness or headaches. Pertinent negatives for diabetes include no chest pain and no fatigue. Current diabetic treatments: amaryl and levemir. He is compliant with treatment all of the time. Home blood sugar record trend: 120 -170. He sees a podiatrist.Eye exam is current (goes every 13 weeks).  Patient does have underlying heart disease denies any type of chest tightness pressure pain he does get little bit short of breath with activity this could be related to his chronic diastolic CHF issues.But it seems to be under good control he states he is not having any significant swelling  Patient using his CPAP no major issues. Patient does have hyperlipidemia but does not tolerate statins at all Even low-dose infrequent still gives him trouble Patient is trying to lose weight but he does have morbid obesity he does try to watch his portions Virtual Visit via Telephone Note  I connected with Aadil Sur Shiflett on 05/12/19 at  1:40 PM EDT by telephone and verified that I am speaking with the correct person using two identifiers.  Location: Patient: home Provider: office   I discussed the limitations, risks, security and privacy concerns of performing an evaluation and management service by telephone and the availability of in person appointments. I also discussed with the patient that there may be a patient responsible charge related to this service. The patient expressed understanding and agreed to proceed.   History of Present Illness:    Observations/Objective:   Assessment and Plan:   Follow Up Instructions:    I discussed the assessment and treatment plan with the patient. The patient was  provided an opportunity to ask questions and all were answered. The patient agreed with the plan and demonstrated an understanding of the instructions.   The patient was advised to call back or seek an in-person evaluation if the symptoms worsen or if the condition fails to improve as anticipated.  I provided 40 minutes of non-face-to-face time during this encounter. 40 minutes was spent with the patient.  This statement verifies that 25 minutes was indeed spent with the patient.  More than 50% of this visit-total duration of the visit-was spent in counseling and coordination of care. The issues that the patient came in for today as reflected in the diagnosis (s) please refer to documentation for further details. Actually 40 minutes spent with this patient       Review of Systems  Constitutional: Negative for diaphoresis, fatigue and fever.  HENT: Negative for congestion and rhinorrhea.   Respiratory: Positive for shortness of breath. Negative for cough.   Cardiovascular: Negative for chest pain and leg swelling.  Gastrointestinal: Negative for abdominal pain and diarrhea.  Skin: Negative for color change and rash.  Neurological: Negative for dizziness, numbness and headaches.  Psychiatric/Behavioral: Negative for behavioral problems and confusion.       Objective:   Physical Exam   Today's visit was via telephone Physical exam was not possible for this visit      Assessment & Plan:  1. Diabetes mellitus due to underlying condition, uncontrolled, with hyperglycemia, with long-term current use of insulin ([place]) This patient has significant diabetes issues.  Cannot afford his medicine.  We are trying to get help for him  regarding his insulin. - Lipid panel - Hepatic function panel - Hemoglobin F1T - Basic metabolic panel - CBC with Differential/Platelet  2. Other hyperlipidemia Hyperlipidemia unfortunately cannot tolerate statins therefore he will do the best he can with diet  - Lipid panel - Hepatic function panel - Hemoglobin M2T - Basic metabolic panel - CBC with Differential/Platelet  3. Other specified anemias He does have a history of anemia.  We will check to see what his CBC is doing.  Probably anemia of chronic disease - Lipid panel - Hepatic function panel - Hemoglobin R1N - Basic metabolic panel - CBC with Differential/Platelet  4. Chronic diastolic CHF (congestive heart failure) ([place]) He does have the diastolic CHF seemingly under good control currently therefore continue current medicine  5. Obstructive sleep apnea- on C-pap Continue CPAP it does help him  6. Morbid obesity ([place]) Try to lose weight. Patient with insomnia issues uses a low-dose Xanax at nighttime intermittently to help him

## 2019-05-12 NOTE — Patient Outreach (Signed)
[place] [facility]) Care Management  05/12/2019  [place] [birth year] [id number]   Incoming call received from patient in regards to [employer] application for  [employer].  Spoke to patient, HIPAA identifiers verified.  Patient was calling to inquire how to obtain his refills from [employer]. Informed patient that his provider's office can call or fax into [employer] his refill request. Per Arita Miss, the representative at [employer], on 02/17/2019 she informed that the provider can reorder the patient's medication 80 days after his 1st shipment which was on 02/17/2019. Patient verbalized understanding.  Patient informed no other questions or concerns.  Annisa Mazzarella P. Rhianna Raulerson, [place] Management [phone]

## 2019-05-13 ENCOUNTER — Telehealth: Payer: Self-pay | Admitting: Family Medicine

## 2019-05-13 MED ORDER — GLIMEPIRIDE 1 MG PO TABS: 1.0000 mg | ORAL_TABLET | Freq: Every day | ORAL | 1 refills | Status: DC

## 2019-05-13 NOTE — Telephone Encounter (Signed)
Patient calling to see why his glimepiride (AMARYL) 1 MG tablet was denied by Dr. Nicki Reaper, says he still takes it.  [facility] mail order pharmacy.

## 2019-05-13 NOTE — Telephone Encounter (Signed)
Prescription sent electronically to pharmacy. Patient notified. 

## 2019-05-13 NOTE — Telephone Encounter (Signed)
1.  It was not on his med list #2 when this was sent to [place] it was before our visit with him with him #3 after his visit and he confirmed he takes it we can order it #4 it would be safer for this patient to take this medication with meal such as lunch rather than at bedtime #5 go ahead and send in 90 pills with a refill

## 2019-05-14 NOTE — Telephone Encounter (Signed)
Have called number multiple times and held over 2-30 minutes both time without being able to speak with representatives.

## 2019-05-15 ENCOUNTER — Ambulatory Visit: Payer: Medicare HMO | Admitting: Podiatry

## 2019-05-15 ENCOUNTER — Encounter: Payer: Self-pay | Admitting: Podiatry

## 2019-05-15 ENCOUNTER — Ambulatory Visit: Payer: Medicare HMO | Admitting: Orthotics

## 2019-05-15 ENCOUNTER — Other Ambulatory Visit: Payer: Self-pay

## 2019-05-15 DIAGNOSIS — B351 Tinea unguium: Secondary | ICD-10-CM

## 2019-05-15 DIAGNOSIS — M79674 Pain in right toe(s): Secondary | ICD-10-CM

## 2019-05-15 DIAGNOSIS — E1142 Type 2 diabetes mellitus with diabetic polyneuropathy: Secondary | ICD-10-CM | POA: Diagnosis not present

## 2019-05-15 DIAGNOSIS — M79675 Pain in left toe(s): Secondary | ICD-10-CM | POA: Diagnosis not present

## 2019-05-15 NOTE — Telephone Encounter (Addendum)
Form received and completed and in Dr [employer] box for signature

## 2019-05-15 NOTE — Progress Notes (Signed)
Autumn filled out form and it is waiting for dr scott signature and then will be faxed back.

## 2019-05-15 NOTE — Patient Instructions (Signed)
Diabetes Mellitus and Foot Care Foot care is an important part of your health, especially when you have diabetes. Diabetes may cause you to have problems because of poor blood flow (circulation) to your feet and legs, which can cause your skin to:  Become thinner and drier.  Break more easily.  Heal more slowly.  Peel and crack. You may also have nerve damage (neuropathy) in your legs and feet, causing decreased feeling in them. This means that you may not notice minor injuries to your feet that could lead to more serious problems. Noticing and addressing any potential problems early is the best way to prevent future foot problems. How to care for your feet Foot hygiene  Wash your feet daily with warm water and mild soap. Do not use hot water. Then, pat your feet and the areas between your toes until they are completely dry. Do not soak your feet as this can dry your skin.  Trim your toenails straight across. Do not dig under them or around the cuticle. File the edges of your nails with an emery board or nail file.  Apply a moisturizing lotion or petroleum jelly to the skin on your feet and to dry, brittle toenails. Use lotion that does not contain alcohol and is unscented. Do not apply lotion between your toes. Shoes and socks  Wear clean socks or stockings every day. Make sure they are not too tight. Do not wear knee-high stockings since they may decrease blood flow to your legs.  Wear shoes that fit properly and have enough cushioning. Always look in your shoes before you put them on to be sure there are no objects inside.  To break in new shoes, wear them for just a few hours a day. This prevents injuries on your feet. Wounds, scrapes, corns, and calluses  Check your feet daily for blisters, cuts, bruises, sores, and redness. If you cannot see the bottom of your feet, use a mirror or ask someone for help.  Do not cut corns or calluses or try to remove them with medicine.  If you  find a minor scrape, cut, or break in the skin on your feet, keep it and the skin around it clean and dry. You may clean these areas with mild soap and water. Do not clean the area with peroxide, alcohol, or iodine.  If you have a wound, scrape, corn, or callus on your foot, look at it several times a day to make sure it is healing and not infected. Check for: ? Redness, swelling, or pain. ? Fluid or blood. ? Warmth. ? Pus or a bad smell. General instructions  Do not cross your legs. This may decrease blood flow to your feet.  Do not use heating pads or hot water bottles on your feet. They may burn your skin. If you have lost feeling in your feet or legs, you may not know this is happening until it is too late.  Protect your feet from hot and cold by wearing shoes, such as at the beach or on hot pavement.  Schedule a complete foot exam at least once a year (annually) or more often if you have foot problems. If you have foot problems, report any cuts, sores, or bruises to your health care provider immediately. Contact a health care provider if:  You have a medical condition that increases your risk of infection and you have any cuts, sores, or bruises on your feet.  You have an injury that is not   healing.  You have redness on your legs or feet.  You feel burning or tingling in your legs or feet.  You have pain or cramps in your legs and feet.  Your legs or feet are numb.  Your feet always feel cold.  You have pain around a toenail. Get help right away if:  You have a wound, scrape, corn, or callus on your foot and: ? You have pain, swelling, or redness that gets worse. ? You have fluid or blood coming from the wound, scrape, corn, or callus. ? Your wound, scrape, corn, or callus feels warm to the touch. ? You have pus or a bad smell coming from the wound, scrape, corn, or callus. ? You have a fever. ? You have a red line going up your leg. Summary  Check your feet every day  for cuts, sores, red spots, swelling, and blisters.  Moisturize feet and legs daily.  Wear shoes that fit properly and have enough cushioning.  If you have foot problems, report any cuts, sores, or bruises to your health care provider immediately.  Schedule a complete foot exam at least once a year (annually) or more often if you have foot problems. This information is not intended to replace advice given to you by your health care provider. Make sure you discuss any questions you have with your health care provider. Document Released: 08/10/2000 Document Revised: 09/25/2017 Document Reviewed: 09/14/2016 Elsevier Patient Education  2020 Elsevier Inc.  

## 2019-05-15 NOTE — Telephone Encounter (Signed)
We cannot get in touch with people at the number you gave [place] [phone]. If there another number we can try to help this pt get his insulin.

## 2019-05-15 NOTE — Telephone Encounter (Signed)
Spoke with patient assistance program who stated they will fax the forms over to be completed for the refill

## 2019-05-18 ENCOUNTER — Encounter (INDEPENDENT_AMBULATORY_CARE_PROVIDER_SITE_OTHER): Payer: Medicare HMO | Admitting: Ophthalmology

## 2019-05-18 ENCOUNTER — Telehealth: Payer: Self-pay | Admitting: Family Medicine

## 2019-05-18 ENCOUNTER — Other Ambulatory Visit: Payer: Self-pay

## 2019-05-18 MED ORDER — GLIMEPIRIDE 1 MG PO TABS: 1.0000 mg | ORAL_TABLET | Freq: Every day | ORAL | 1 refills | Status: DC

## 2019-05-18 NOTE — Telephone Encounter (Signed)
Patient states prescription was sent to wrong pharmacy needs to go to [facility] for his glimepride 1mg  and his insulin needs to go to the company and not [employer]. Please Advise

## 2019-05-18 NOTE — Telephone Encounter (Signed)
All I can say regarding the forms as I am signing the forms and send them back in a folder-thank you

## 2019-05-18 NOTE — Telephone Encounter (Signed)
Pt contacted. Pt states the Glimepride 1 mg is to go to [facility]. The Lantus he is taking 72-74 units a night. Pt states he does not need a refill on insulin at this time but his refills need to be through the Prescription Assistance Program. Forms were faxed over late last week for provider to review. Glimepride 1 mg sent to [facility].

## 2019-05-18 NOTE — Telephone Encounter (Signed)
Leave message open until forms are ready

## 2019-05-19 ENCOUNTER — Other Ambulatory Visit: Payer: Self-pay | Admitting: Pharmacy Technician

## 2019-05-19 ENCOUNTER — Ambulatory Visit: Payer: Medicare HMO | Admitting: Orthotics

## 2019-05-19 MED ORDER — LEVEMIR FLEXTOUCH 100 UNIT/ML ~~LOC~~ SOPN: 72.0000 [IU] | PEN_INJECTOR | Freq: Every day | SUBCUTANEOUS | 1 refills | Status: DC

## 2019-05-19 NOTE — Telephone Encounter (Signed)
Thank you for this information. I have printed out the script for Levemir and once provider has signed it, I will fax it over. Have a great day.

## 2019-05-19 NOTE — Patient Outreach (Signed)
[facility]) Care Management  05/19/2019  Eugene [name] [birth year] [id number]    Returning patient's call from 05/18/2019.  Successful outreach call placed to patient in regrards to Levemir refill with [employer].  Spoke to patient, HIPAA identifiers verified.  Patient informed that the provider's office sent his Levemir to his local pharmacy. He inquired and requested that I send them a note or call them and explain that he uses the patient assistance program. Informed patient I would send them an inbasket message. Patient was agreeable to this plan.  Send an inbasket message to LPN Eugene [name] requesting if the Levemir prescription could be sent to [employer] so that patient can receive his medication through the patient assistance foundation at no out of pocket cost to him.  Sarafina Puthoff P. Hau Sanor, [place] Management [phone]

## 2019-05-19 NOTE — Telephone Encounter (Signed)
Form was signed as requested

## 2019-05-20 ENCOUNTER — Encounter (INDEPENDENT_AMBULATORY_CARE_PROVIDER_SITE_OTHER): Payer: Medicare HMO | Admitting: Ophthalmology

## 2019-05-20 ENCOUNTER — Other Ambulatory Visit: Payer: Self-pay

## 2019-05-20 DIAGNOSIS — D3132 Benign neoplasm of left choroid: Secondary | ICD-10-CM

## 2019-05-20 DIAGNOSIS — E113512 Type 2 diabetes mellitus with proliferative diabetic retinopathy with macular edema, left eye: Secondary | ICD-10-CM | POA: Diagnosis not present

## 2019-05-20 DIAGNOSIS — E113311 Type 2 diabetes mellitus with moderate nonproliferative diabetic retinopathy with macular edema, right eye: Secondary | ICD-10-CM

## 2019-05-20 DIAGNOSIS — I10 Essential (primary) hypertension: Secondary | ICD-10-CM

## 2019-05-20 DIAGNOSIS — H43813 Vitreous degeneration, bilateral: Secondary | ICD-10-CM

## 2019-05-20 DIAGNOSIS — E11311 Type 2 diabetes mellitus with unspecified diabetic retinopathy with macular edema: Secondary | ICD-10-CM | POA: Diagnosis not present

## 2019-05-20 DIAGNOSIS — H35033 Hypertensive retinopathy, bilateral: Secondary | ICD-10-CM

## 2019-05-20 NOTE — Telephone Encounter (Signed)
Forms faxed

## 2019-05-21 ENCOUNTER — Encounter: Payer: Self-pay | Admitting: Podiatry

## 2019-05-21 NOTE — Progress Notes (Signed)
Subjective: Eugene [name] is seen today for follow up preventative diabetic foot care with painful, elongated, thickened toenails 1-5 b/l feet that he cannot cut. Pain interferes with daily activities. Aggravating factor includes wearing enclosed shoe gear and relieved with periodic debridement.  He relates he has been using Foot Miracle.   Current Outpatient Medications on File Prior to Visit  Medication Sig  . ACCU-CHEK SMARTVIEW test strip TEST BLOOD SUGAR ONE TIME DAILY  . acetaminophen (TYLENOL) 500 MG tablet Take 1,500 mg by mouth every 6 (six) hours as needed for mild pain, fever or headache.   . allopurinol (ZYLOPRIM) 100 MG tablet Take one tablet by mouth daily  . ALPRAZolam (XANAX) 0.5 MG tablet 1/2 to 1 qhs prn insomnia  . aspirin 81 MG chewable tablet Chew 81 mg by mouth at bedtime.  . Cholecalciferol (VITAMIN D3 PO) Take 1 capsule by mouth every other day.  . Insulin Pen Needle (BD PEN NEEDLE NANO U/F) 32G X 4 MM MISC Use once daily as directed  . isosorbide mononitrate (IMDUR) 60 MG 24 hr tablet TAKE 1 TABLET (60 MG TOTAL) BY MOUTH DAILY.  [name] losartan (COZAAR) 25 MG tablet TAKE 1 TABLET (25 MG TOTAL) BY MOUTH DAILY.  . metoprolol tartrate (LOPRESSOR) 25 MG tablet Take 1 tablet (25 mg total) by mouth 2 (two) times daily.  [name] ofloxacin (FLOXIN) 0.3 % OTIC solution PLACE 5 DROPS INTO THE LEFT EAR 2 (TWO) TIMES DAILY.  [name] spironolactone (ALDACTONE) 25 MG tablet Take 0.5 tablets (12.5 mg total) by mouth daily.  [name] torsemide (DEMADEX) 20 MG tablet Take 2 tablets (40 mg total) by mouth daily.  . traMADol (ULTRAM) 50 MG tablet TAKE 1 TABLET (50 MG TOTAL) BY MOUTH EVERY 6 (SIX) HOURS AS NEEDED   No current facility-administered medications on file prior to visit.      Allergies  Allergen Reactions  . Hydrocodone Nausea And Vomiting  . Lisinopril Cough  . Neurontin [Gabapentin] Other (See Comments)    Reaction:  Suicidal thoughts   . Statins Other (See Comments)    Reaction:   Muscle pain   . Metformin And Related Diarrhea  . Norvasc [Amlodipine Besylate] Swelling and Other (See Comments)    Reaction:  Pedal edema  Objective:  Vascular Examination: Capillary refill time immediate x 10 digits.  Dorsalis pedis present b/l.  Posterior tibial pulses present b/l.  Digital hair present x 10 digits.  Skin temperature gradient WNL b/l.   Dermatological Examination: Skin with normal turgor, texture and tone b/l.  Toenails 1-5 b/l discolored, thick, dystrophic with subungual debris and pain with palpation to nailbeds due to thickness of nails.  Musculoskeletal: Muscle strength 5/5 to all LE muscle groups  No gross bony deformities b/l.  No pain, crepitus or joint limitation noted with ROM.   Neurological Examination: Protective sensation diminished with 10 gram monofilament bilaterally.  Assessment: Painful onychomycosis toenails 1-5 b/l  NIDDM with neuropathy  Plan: 1. Toenails 1-5 b/l were debrided in length and girth without iatrogenic bleeding. 2. Patient to continue soft, supportive shoe gear 3. Patient to report any pedal injuries to medical professional immediately. 4. Follow up 3 months.  5. Patient/POA to call should there be a concern in the interim.

## 2019-05-27 ENCOUNTER — Other Ambulatory Visit: Payer: Self-pay | Admitting: Family Medicine

## 2019-05-28 NOTE — Telephone Encounter (Signed)
I am okay with refilling these for 90 days with 1 refill both of them thank you

## 2019-06-04 ENCOUNTER — Other Ambulatory Visit: Payer: Self-pay | Admitting: Pharmacy Technician

## 2019-06-04 NOTE — Patient Outreach (Signed)
[place] [facility] Care Management  06/04/2019  [name] [birth year] [id number]    Successful outreach call placed to patient in regards to [employer] application for Levemir refill.  Was returning patient's voicemail he left me concerning if his dr had faxed in the refill paperwork to [employer].  Spoke to patient, HIPAA identifiers verified.  Informed patient to contact his provider's office to make 793% certainty but from what I could tell thru my correspondence with the provider's office it looks as though they faxed in the refills to [employer]. Patient verbalized understanding.  Trase Bunda P. Joshawa Dubin, [place] Management [phone]

## 2019-06-16 ENCOUNTER — Ambulatory Visit: Payer: Medicare HMO | Admitting: Orthotics

## 2019-06-29 ENCOUNTER — Other Ambulatory Visit: Payer: Self-pay | Admitting: Family Medicine

## 2019-07-07 ENCOUNTER — Telehealth: Payer: Self-pay | Admitting: *Deleted

## 2019-07-07 NOTE — Telephone Encounter (Signed)
Pt called and states he never received his insulin from prescription assistance program and he only has enough for 2 weeks. I called off and on all day yesterday and could not get through. Called this morning and spoke to someone and she states they received his form on 9/20 when we first sent it in and again yesterday when I refaxed it. She states the problem is that it has max dose is 80 units and directions 72 units qhs. I told her he was taking 72 units and may titrate up to 80 units. She said she did not need [place] to do another form she woud enter the information and he should get his insulin in 10 -14 business days. She gave me a number for the pt to call to get a short supply sent to local pharm so he wont run out [phone]. Called pt and gave him the number to call and told him his shipment should come in 10-14 business days. Pt verbalized understanding.

## 2019-07-08 ENCOUNTER — Encounter: Payer: Self-pay | Admitting: Cardiovascular Disease

## 2019-07-08 ENCOUNTER — Other Ambulatory Visit: Payer: Self-pay

## 2019-07-08 ENCOUNTER — Ambulatory Visit (INDEPENDENT_AMBULATORY_CARE_PROVIDER_SITE_OTHER): Payer: Medicare HMO | Admitting: Cardiovascular Disease

## 2019-07-08 VITALS — BP 150/72 | HR 72 | Ht 73.0 in | Wt 258.0 lb

## 2019-07-08 DIAGNOSIS — I10 Essential (primary) hypertension: Secondary | ICD-10-CM

## 2019-07-08 DIAGNOSIS — E1122 Type 2 diabetes mellitus with diabetic chronic kidney disease: Secondary | ICD-10-CM | POA: Diagnosis not present

## 2019-07-08 DIAGNOSIS — E785 Hyperlipidemia, unspecified: Secondary | ICD-10-CM

## 2019-07-08 DIAGNOSIS — I25708 Atherosclerosis of coronary artery bypass graft(s), unspecified, with other forms of angina pectoris: Secondary | ICD-10-CM | POA: Diagnosis not present

## 2019-07-08 DIAGNOSIS — N183 Chronic kidney disease, stage 3 unspecified: Secondary | ICD-10-CM | POA: Diagnosis not present

## 2019-07-08 DIAGNOSIS — I313 Pericardial effusion (noninflammatory): Secondary | ICD-10-CM | POA: Diagnosis not present

## 2019-07-08 DIAGNOSIS — I5032 Chronic diastolic (congestive) heart failure: Secondary | ICD-10-CM | POA: Diagnosis not present

## 2019-07-08 DIAGNOSIS — I3139 Other pericardial effusion (noninflammatory): Secondary | ICD-10-CM

## 2019-07-08 NOTE — Patient Instructions (Addendum)
Medication Instructions:  Continue all current medications.  Labwork: none  Testing/Procedures:  Your physician has requested that you have a limited echocardiogram. Echocardiography is a painless test that uses sound waves to create images of your heart. It provides your doctor with information about the size and shape of your heart and how well your heart's chambers and valves are working. This procedure takes approximately one hour. There are no restrictions for this procedure.  (DUE IN 3 MONTHS)  Office will contact with results via phone or letter.    Follow-Up: Your physician wants you to follow up in: 6 months.  You will receive a reminder letter in the mail one-two months in advance.  If you don't receive a letter, please call our office to schedule the follow up appointment   Any Other Special Instructions Will Be Listed Below (If Applicable).  If you need a refill on your cardiac medications before your next appointment, please call your pharmacy.

## 2019-07-08 NOTE — Progress Notes (Signed)
SUBJECTIVE: The patient presents routine follow-up.  I last saw him in July 2020.  In summary, he was hospitalized in June 2020 for recurrent pericardial effusion and acute on chronic diastolic heart failure deemed secondary to noncompliance with eating habits and sodium intake.  I interpreted his echocardiogram on 02/22/2019 which demonstrated EF 50%, restrictive diastolic dysfunction with elevated LVEDP, moderate sized pericardial effusion with no tamponade, severe left atrial dilatation, and mitral and aortic annular calcification.  He diuresed approximately 5 L.  He was discharged on torsemide 40 mg daily along with ARB, metoprolol and Imdur.  Low-dose spironolactone was also added.  It is felt that pericardial sac biopsy demonstrated benign and reactive, nonmalignant cells.  He is doing well.  He occasionally has chest twinges but this is seldom in nature.  He denies any significant shortness of breath, leg swelling, orthopnea, paroxysmal nocturnal dyspnea.  He is trying to walk more.  He is trying to avoid canned foods and chips.      Review of Systems: As per "subjective", otherwise negative.  Allergies  Allergen Reactions  . Hydrocodone Nausea And Vomiting  . Lisinopril Cough  . Neurontin [Gabapentin] Other (See Comments)    Reaction:  Suicidal thoughts   . Statins Other (See Comments)    Reaction:  Muscle pain   . Metformin And Related Diarrhea  . Norvasc [Amlodipine Besylate] Swelling and Other (See Comments)    Reaction:  Pedal edema    Current Outpatient Medications  Medication Sig Dispense Refill  . ACCU-CHEK SMARTVIEW test strip TEST BLOOD SUGAR ONE TIME DAILY 100 strip 5  . acetaminophen (TYLENOL) 500 MG tablet Take 1,500 mg by mouth every 6 (six) hours as needed for mild pain, fever or headache.     . allopurinol (ZYLOPRIM) 100 MG tablet Take one tablet by mouth daily 90 tablet 1  . ALPRAZolam (XANAX) 0.5 MG tablet 1/2 to 1 qhs prn insomnia 30 tablet 5   . aspirin 81 MG chewable tablet Chew 81 mg by mouth at bedtime.    . Cholecalciferol (VITAMIN D3 PO) Take 1 capsule by mouth every other day.    [name] glimepiride (AMARYL) 1 MG tablet Take 1 tablet (1 mg total) by mouth daily with breakfast. 90 tablet 1  . Insulin Detemir (LEVEMIR FLEXTOUCH) 100 UNIT/ML Pen Inject 72 Units into the skin daily at 10 pm. 10 pen 1  . Insulin Pen Needle (BD PEN NEEDLE NANO U/F) 32G X 4 MM MISC Use once daily as directed 100 each 0  . isosorbide mononitrate (IMDUR) 60 MG 24 hr tablet TAKE 1 TABLET (60 MG TOTAL) BY MOUTH DAILY. 90 tablet 3  . losartan (COZAAR) 25 MG tablet TAKE 1 TABLET (25 MG TOTAL) BY MOUTH DAILY. 90 tablet 1  . metoprolol tartrate (LOPRESSOR) 25 MG tablet TAKE 1 TABLET TWICE DAILY 180 tablet 1  . ofloxacin (FLOXIN) 0.3 % OTIC solution PLACE 5 DROPS INTO THE LEFT EAR 2 (TWO) TIMES DAILY. 10 mL 0  . spironolactone (ALDACTONE) 25 MG tablet Take 0.5 tablets (12.5 mg total) by mouth daily. 30 tablet 0  . torsemide (DEMADEX) 20 MG tablet TAKE 1-2 TABLETS BY MOUTH EACH MORNING AS DIRECTED BY PHYSICIAN 90 tablet 1  . traMADol (ULTRAM) 50 MG tablet TAKE 1 TABLET (50 MG TOTAL) BY MOUTH EVERY 6 (SIX) HOURS AS NEEDED 24 tablet 1   No current facility-administered medications for this visit.     Past Medical History:  Diagnosis Date  . Anemia   .  Anxiety   . Arteriosclerotic cardiovascular disease (ASCVD)    a. 05/2011 s/p DES to PDA and RCA. b. 08/2012 Inflat  STEMI/Cath/PCI: LM minor irregs, LAD 50p, D1 50, LCX nl, OM1 25, RCA 30-40p, 100d (treated with 2.75x102mm Promus Premier DES);    [name] Asthma   . Bell palsy   . C. difficile colitis    a. 08/2012  . Cholelithiasis 07/2012   Asymptomatic; identified incidentally  . Chronic diastolic CHF (congestive heart failure) ([place])   . CKD (chronic kidney disease), stage III   . Contrast dye induced nephropathy    a. 08/2012 post cath/pci  . COPD (chronic obstructive pulmonary disease) ([place])   . Diabetes mellitus     Peripheral neuropathy  . Gallstones   . GERD (gastroesophageal reflux disease)   . Hyperlipidemia   . Hypertension   . Myocardial infarct ([place]) 09/08/12  . Nephrolithiasis   . Old myocardial infarction   . Pericardial effusion    a. s/p window in 2014. b. s/p redo window in 09/2018.  [name] Peripheral neuropathy   . PONV (postoperative nausea and vomiting)   . Sleep apnea   . Statin intolerance     Past Surgical History:  Procedure Laterality Date  . CARDIAC CATHETERIZATION    . CHEST TUBE INSERTION    . CIRCUMCISION    . COLONOSCOPY     In [facility], approximately 2011 per patient, was normal. Advised to come back in 10 years.  . ESOPHAGOGASTRODUODENOSCOPY     in [place] [facility] over 20 yrs ago  . ESOPHAGOGASTRODUODENOSCOPY  06/12/2012   [id number] esophagus-status post Venia Minks dilation. Abnormal gastric mucosa of uncertain significance-status post biopsy  . INTRAOPERATIVE TRANSESOPHAGEAL ECHOCARDIOGRAM N/A 06/23/2013   Procedure: INTRAOPERATIVE TRANSESOPHAGEAL ECHOCARDIOGRAM;  Surgeon: Grace Isaac, MD;  Location: [facility];  Service: Open Heart Surgery;  Laterality: N/A;  . LEFT HEART CATHETERIZATION WITH CORONARY ANGIOGRAM N/A 09/08/2012   Procedure: LEFT HEART CATHETERIZATION WITH CORONARY ANGIOGRAM;  Surgeon: [name], MD;  Location[facility] CATH LAB;  Service: Cardiovascular;  Laterality: N/A;  . stents    . SUBXYPHOID PERICARDIAL WINDOW N/A 06/23/2013   Procedure: SUBXYPHOID PERICARDIAL WINDOW;  Surgeon: Grace Isaac, MD;  Location: [place];  Service: Thoracic;  Laterality: N/A;  . SUBXYPHOID PERICARDIAL WINDOW  10/01/2018  . SUBXYPHOID PERICARDIAL WINDOW N/A 10/01/2018   Procedure: REDO SUBXYPHOID PERICARDIAL WINDOW;  Surgeon: Grace Isaac, MD;  Location: [place];  Service: Thoracic;  Laterality: N/A;  . TEE WITHOUT CARDIOVERSION N/A 10/01/2018   Procedure: TRANSESOPHAGEAL ECHOCARDIOGRAM (TEE);  Surgeon: Grace Isaac, MD;  Location[facility] OR;  Service:  Thoracic;  Laterality: N/A;    Social History   Socioeconomic History  . Marital status: Married    Spouse name: Not on file  . Number of children: 2  . Years of education: Not on file  . Highest education level: Not on file  Occupational History  . Occupation: Personal assistant: [employer]    Comment: Scientist, physiological  . Financial resource strain: Not on file  . Food insecurity    Worry: Not on file    Inability: Not on file  . Transportation needs    Medical: Not on file    Non-medical: Not on file  Tobacco Use  . Smoking status: Never Smoker  . Smokeless tobacco: Never Used  Substance and Sexual Activity  . Alcohol use: No    Alcohol/week: 0.0 standard drinks    Comment: heavy  etoh use 30 years ago  . Drug use: No  . Sexual activity: Yes    Birth control/protection: None  Lifestyle  . Physical activity    Days per week: Not on file    Minutes per session: Not on file  . Stress: Not on file  Relationships  . Social Herbalist on phone: Not on file    Gets together: Not on file    Attends religious service: Not on file    Active member of club or organization: Not on file    Attends meetings of clubs or organizations: Not on file    Relationship status: Not on file  . Intimate partner violence    Fear of current or ex partner: Not on file    Emotionally abused: Not on file    Physically abused: Not on file    Forced sexual activity: Not on file  Other Topics Concern  . 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 Disabled at this point.     Vitals:   07/08/19 1258  BP: (!) 150/72  Pulse: 72  SpO2: 95%  Weight: 258 lb (117 kg)  Height: 6\' 1"  (1.854 m)    Wt Readings from Last 3 Encounters:  07/08/19 258 lb (117 kg)  03/03/19 254 lb (115.2 kg)  02/24/19 258 lb 13.1 oz (117.4 kg)     PHYSICAL EXAM General: NAD HEENT: Normal. Neck: No JVD, no thyromegaly. Lungs:  Clear to auscultation bilaterally with normal respiratory effort. CV: Regular rate and rhythm, normal S1/S2, no S3/S4, no murmur. No pretibial or periankle edema.   Abdomen: Soft, nontender, no distention.  Neurologic: Alert and oriented.  Psych: Normal affect. Skin: Normal. Musculoskeletal: No gross deformities.      Labs: Lab Results  Component Value Date/Time   K 4.4 02/23/2019 06:29 AM   BUN 38 (H) 02/23/2019 06:29 AM   BUN 61 (H) 07/28/2018 11:56 AM   CREATININE 1.75 (H) 02/23/2019 06:29 AM   CREATININE 1.26 10/26/2014 03:29 PM   ALT 17 10/03/2018 02:20 AM   TSH 1.585 11/02/2016 09:58 PM   TSH 1.780 10/26/2014 03:25 PM   HGB 10.9 (L) 02/23/2019 06:29 AM   HGB 11.2 (L) 11/27/2016 02:10 PM     Lipids: Lab Results  Component Value Date/Time   LDLCALC 125 (H) 01/01/2019 11:58 AM   CHOL 224 (H) 01/01/2019 11:58 AM   TRIG 338 (H) 01/01/2019 11:58 AM   HDL 31 (L) 01/01/2019 11:58 AM       ASSESSMENT AND PLAN: 1.  Chronic diastolic heart failure: Euvolemic on torsemide 40 mg daily along with spironolactone 12.5 mg.  Chronic kidney disease stage III stable.  Weight 258 pounds.    2.  Recurrent pericardial effusion status post window x2: Pericardial sac biopsy was negative for malignancy.  The cells were benign/reactive in nature and not felt to be indicative of malignancy.  Moderate sized pericardial effusion by most recent echocardiogram in June 2020.  I will obtain a follow-up limited echocardiogram in 3 months.  3.  Coronary disease status post CABG: No recurrent angina.  LVEF 50%.  Continue medical therapy with aspirin, Imdur, and Lopressor.  4.  Hypertension: He checks his blood pressure and it is normal at home.  He said it is always elevated when he comes to our office.  No changes to therapy.  5.  Chronic kidney disease stage III: BUN 38, creatinine 1.75  on 02/23/2019.  6.  Hyperlipidemia: He is now taking Crestor 5 mg two days/week.  LDL 125 on 01/01/2019.      Disposition: Follow up 6 months   Kate Sable, M.D., F.A.C.C.

## 2019-07-14 NOTE — Telephone Encounter (Signed)
error 

## 2019-07-20 ENCOUNTER — Other Ambulatory Visit: Payer: Self-pay | Admitting: Family Medicine

## 2019-07-25 ENCOUNTER — Other Ambulatory Visit: Payer: Self-pay | Admitting: Family Medicine

## 2019-07-30 ENCOUNTER — Other Ambulatory Visit: Payer: Self-pay

## 2019-07-30 ENCOUNTER — Ambulatory Visit (INDEPENDENT_AMBULATORY_CARE_PROVIDER_SITE_OTHER): Payer: Medicare HMO | Admitting: Family Medicine

## 2019-07-30 ENCOUNTER — Telehealth: Payer: Self-pay | Admitting: Family Medicine

## 2019-07-30 ENCOUNTER — Other Ambulatory Visit: Payer: Self-pay | Admitting: Family Medicine

## 2019-07-30 DIAGNOSIS — M10321 Gout due to renal impairment, right elbow: Secondary | ICD-10-CM | POA: Diagnosis not present

## 2019-07-30 MED ORDER — COLCHICINE 0.6 MG PO TABS: ORAL_TABLET | ORAL | 2 refills | Status: DC

## 2019-07-30 MED ORDER — PREDNISONE 20 MG PO TABS: ORAL_TABLET | ORAL | 0 refills | Status: DC

## 2019-07-30 NOTE — Telephone Encounter (Signed)
Patient said his gout is flaring up again and wanted to know if we would call something in to [facility] in [place].  Michela Pitcher he gets it all the time.

## 2019-07-30 NOTE — Telephone Encounter (Signed)
Front-please put him on my schedule for today for gout

## 2019-07-30 NOTE — Progress Notes (Signed)
   Subjective:    Patient ID: Eugene [name], male    DOB: May 06, 1956, 63 y.o.   MRN: [id number]  HPI Phone visit with the patient he was having gout in his wrist and elbow denies fever chills denies redness states it does feel swollen and tender and painful.  He has had this before.  This came on over the past couple days. Patient does have diabetes  Review of Systems Denies fever chills sweats difficulty breathing denies pain in other joints currently    Objective:   Physical Exam Today's visit was via telephone Physical exam was not possible for this visit        Assessment & Plan:  Probable gout I doubt infection Prednisone taper watch sugars closely Colchicine twice daily as needed over the next few days Follow-up as planned in December for regular checkup.

## 2019-08-04 ENCOUNTER — Ambulatory Visit: Payer: Medicare HMO | Admitting: Family Medicine

## 2019-08-06 DIAGNOSIS — E7849 Other hyperlipidemia: Secondary | ICD-10-CM | POA: Diagnosis not present

## 2019-08-06 DIAGNOSIS — Z794 Long term (current) use of insulin: Secondary | ICD-10-CM | POA: Diagnosis not present

## 2019-08-06 DIAGNOSIS — E0865 Diabetes mellitus due to underlying condition with hyperglycemia: Secondary | ICD-10-CM | POA: Diagnosis not present

## 2019-08-06 DIAGNOSIS — D6489 Other specified anemias: Secondary | ICD-10-CM | POA: Diagnosis not present

## 2019-08-07 ENCOUNTER — Other Ambulatory Visit: Payer: Self-pay

## 2019-08-07 ENCOUNTER — Ambulatory Visit: Payer: Medicare HMO | Admitting: Podiatry

## 2019-08-07 ENCOUNTER — Encounter: Payer: Self-pay | Admitting: Podiatry

## 2019-08-07 DIAGNOSIS — M79675 Pain in left toe(s): Secondary | ICD-10-CM | POA: Diagnosis not present

## 2019-08-07 DIAGNOSIS — E1142 Type 2 diabetes mellitus with diabetic polyneuropathy: Secondary | ICD-10-CM

## 2019-08-07 DIAGNOSIS — B351 Tinea unguium: Secondary | ICD-10-CM

## 2019-08-07 DIAGNOSIS — S90412A Abrasion, left great toe, initial encounter: Secondary | ICD-10-CM

## 2019-08-07 DIAGNOSIS — M79674 Pain in right toe(s): Secondary | ICD-10-CM

## 2019-08-07 LAB — CBC WITH DIFFERENTIAL/PLATELET
Basophils Absolute: 0 10*3/uL (ref 0.0–0.2)
Basos: 1 %
EOS (ABSOLUTE): 0.1 10*3/uL (ref 0.0–0.4)
Eos: 2 %
Hematocrit: 43.4 % (ref 37.5–51.0)
Hemoglobin: 13.8 g/dL (ref 13.0–17.7)
Immature Grans (Abs): 0.1 10*3/uL (ref 0.0–0.1)
Immature Granulocytes: 1 %
Lymphocytes Absolute: 1.4 10*3/uL (ref 0.7–3.1)
Lymphs: 19 %
MCH: 28.5 pg (ref 26.6–33.0)
MCHC: 31.8 g/dL (ref 31.5–35.7)
MCV: 90 fL (ref 79–97)
Monocytes Absolute: 0.6 10*3/uL (ref 0.1–0.9)
Monocytes: 9 %
Neutrophils Absolute: 4.9 10*3/uL (ref 1.4–7.0)
Neutrophils: 68 %
Platelets: 219 10*3/uL (ref 150–450)
RBC: 4.85 x10E6/uL (ref 4.14–5.80)
RDW: 14.1 % (ref 11.6–15.4)
WBC: 7.2 10*3/uL (ref 3.4–10.8)

## 2019-08-07 LAB — BASIC METABOLIC PANEL
BUN/Creatinine Ratio: 35 — ABNORMAL HIGH (ref 10–24)
BUN: 50 mg/dL — ABNORMAL HIGH (ref 8–27)
CO2: 25 mmol/L (ref 20–29)
Calcium: 9.6 mg/dL (ref 8.6–10.2)
Chloride: 97 mmol/L (ref 96–106)
Creatinine, Ser: 1.43 mg/dL — ABNORMAL HIGH (ref 0.76–1.27)
GFR calc Af Amer: 60 mL/min/{1.73_m2} (ref >59)
GFR calc non Af Amer: 52 mL/min/{1.73_m2} — ABNORMAL LOW (ref >59)
Glucose: 280 mg/dL — ABNORMAL HIGH (ref 65–99)
Potassium: 5.3 mmol/L — ABNORMAL HIGH (ref 3.5–5.2)
Sodium: 136 mmol/L (ref 134–144)

## 2019-08-07 LAB — HEPATIC FUNCTION PANEL
ALT: 16 IU/L (ref 0–44)
AST: 20 IU/L (ref 0–40)
Albumin: 3.8 g/dL (ref 3.8–4.8)
Alkaline Phosphatase: 102 IU/L (ref 39–117)
Bilirubin Total: 0.4 mg/dL (ref 0.0–1.2)
Bilirubin, Direct: 0.13 mg/dL (ref 0.00–0.40)
Total Protein: 6.3 g/dL (ref 6.0–8.5)

## 2019-08-07 LAB — LIPID PANEL
Chol/HDL Ratio: 6.8 ratio — ABNORMAL HIGH (ref 0.0–5.0)
Cholesterol, Total: 223 mg/dL — ABNORMAL HIGH (ref 100–199)
HDL: 33 mg/dL — ABNORMAL LOW (ref >39)
LDL Chol Calc (NIH): 107 mg/dL — ABNORMAL HIGH (ref 0–99)
Triglycerides: 485 mg/dL — ABNORMAL HIGH (ref 0–149)
VLDL Cholesterol Cal: 83 mg/dL — ABNORMAL HIGH (ref 5–40)

## 2019-08-07 LAB — HEMOGLOBIN A1C
Est. average glucose Bld gHb Est-mCnc: 192 mg/dL
Hgb A1c MFr Bld: 8.3 % — ABNORMAL HIGH (ref 4.8–5.6)

## 2019-08-07 MED ORDER — NONFORMULARY OR COMPOUNDED ITEM: 3 refills | Status: DC

## 2019-08-07 NOTE — Patient Instructions (Signed)
[facility] [phone]   [email or web address]

## 2019-08-10 ENCOUNTER — Other Ambulatory Visit: Payer: Self-pay

## 2019-08-10 ENCOUNTER — Other Ambulatory Visit: Payer: Self-pay | Admitting: Family Medicine

## 2019-08-10 MED ORDER — GLIMEPIRIDE 1 MG PO TABS: ORAL_TABLET | ORAL | 1 refills | Status: DC

## 2019-08-15 NOTE — Progress Notes (Signed)
Subjective:  Eugene [name] presents to clinic today for preventative diabetic foot care. He has h/o neuropathy. He presents with chronic thick, discolored, elongated toenails  of both feet that become tender and patient cannot cut because of thickness. Pain is aggravated when wearing enclosed shoe gear and relieved with periodic professional debridement.  Today, he presents with light bleeding of left great toe and states he may have stubbed it in the bathroom. He states he has no feeling in his toes and does not realize he may have injured his foot.   He would also like to discuss topical treatment for his fungal toenails.  Medications reviewed in chart.  Allergies  Allergen Reactions  . Hydrocodone Nausea And Vomiting  . Lisinopril Cough  . Neurontin [Gabapentin] Other (See Comments)    Reaction:  Suicidal thoughts   . Statins Other (See Comments)    Reaction:  Muscle pain   . Metformin And Related Diarrhea  . Norvasc [Amlodipine Besylate] Swelling and Other (See Comments)    Reaction:  Pedal edema     Objective:  Physical Examination:  Vascular Examination: Capillary refill time immediate b/l.  Palpable DP/PT pulses b/l.  Digital hair sparse b/l.  No edema noted b/l.  Skin temperature gradient WNL b/l.  Dermatological Examination: Skin with normal turgor, texture and tone b/l.  Abrasion noted left great toe. No active bleeding. No erythema, no edema, no purulence, no flocculence.  No interdigital macerations noted b/l.  Elongated, thick, discolored brittle toenails with subungual debris and pain on dorsal palpation of nailbeds 1-5 b/l.  Musculoskeletal Examination: Muscle strength 5/5 to all muscle groups b/l.  No pain, crepitus or joint discomfort with active/passive ROM.  Neurological Examination: Sensation diminished b/l with 10 gram monofilament.   Assessment: Mycotic nail infection with pain 1-5 b/l Abrasion left hallux, noninfected NIDDM with  neuropathy  Plan: 1. Toenails 1-5 b/l were debrided in length and girth without iatrogenic laceration. Discussed topical, laser and oral medication. Patient opted for topical treatment with compounded medication. Rx written for nonformulary compounding topical antifungal: [facility]: Antifungal cream - Terbinafine 3%, Fluconazole 2%, Tea Tree Oil 5%, Urea 10%, Ibuprofen 2% in DMSO Suspension #37ml. Apply to the affected nail(s) at bedtime. 2. [name] was instructed to have his wife apply triple antibiotic ointment to left great toe once daily until healed. Call if he has any problems. 3. Continue soft, supportive shoe gear daily. 4. Report any pedal injuries to medical professional. 5. Follow up 3 months. 6. Patient/POA to call should there be a question/concern in there interim.

## 2019-08-17 ENCOUNTER — Encounter (INDEPENDENT_AMBULATORY_CARE_PROVIDER_SITE_OTHER): Payer: Medicare HMO | Admitting: Ophthalmology

## 2019-08-17 DIAGNOSIS — H43813 Vitreous degeneration, bilateral: Secondary | ICD-10-CM

## 2019-08-17 DIAGNOSIS — D3132 Benign neoplasm of left choroid: Secondary | ICD-10-CM

## 2019-08-17 DIAGNOSIS — H35033 Hypertensive retinopathy, bilateral: Secondary | ICD-10-CM

## 2019-08-17 DIAGNOSIS — E113311 Type 2 diabetes mellitus with moderate nonproliferative diabetic retinopathy with macular edema, right eye: Secondary | ICD-10-CM

## 2019-08-17 DIAGNOSIS — I10 Essential (primary) hypertension: Secondary | ICD-10-CM

## 2019-08-17 DIAGNOSIS — E113512 Type 2 diabetes mellitus with proliferative diabetic retinopathy with macular edema, left eye: Secondary | ICD-10-CM | POA: Diagnosis not present

## 2019-08-18 ENCOUNTER — Other Ambulatory Visit: Payer: Self-pay | Admitting: Family Medicine

## 2019-08-18 NOTE — Telephone Encounter (Signed)
Med check 05/12/19

## 2019-08-31 ENCOUNTER — Ambulatory Visit (INDEPENDENT_AMBULATORY_CARE_PROVIDER_SITE_OTHER): Payer: Medicare HMO | Admitting: Family Medicine

## 2019-08-31 ENCOUNTER — Telehealth: Payer: Self-pay | Admitting: Family Medicine

## 2019-08-31 ENCOUNTER — Other Ambulatory Visit: Payer: Self-pay | Admitting: Family Medicine

## 2019-08-31 DIAGNOSIS — R300 Dysuria: Secondary | ICD-10-CM | POA: Diagnosis not present

## 2019-08-31 DIAGNOSIS — R1012 Left upper quadrant pain: Secondary | ICD-10-CM

## 2019-08-31 MED ORDER — CEPHALEXIN 500 MG PO CAPS: 500.0000 mg | ORAL_CAPSULE | Freq: Four times a day (QID) | ORAL | 0 refills | Status: DC

## 2019-08-31 NOTE — Progress Notes (Signed)
   Subjective:    Patient ID: Eugene [name], male    DOB: [birth year], 64 y.o.   MRN: [id number]  HPI  Patient with left side pain discomfort hurts when he coughs hurts when he rotates but he states that he has felt this way before when he had a kidney infection has noted some increased urination but denies dysuria fever chills denies nausea vomiting diarrhea.  Trying to stay at home to minimize risk of getting Covid. Virtual Visit via Video Note  I connected with Eugene [name] on 08/31/19 at  4:10 PM EST by a video enabled telemedicine application and verified that I am speaking with the correct person using two identifiers.  Location: Patient: Home Provider: Office   I discussed the limitations of evaluation and management by telemedicine and the availability of in person appointments. The patient expressed understanding and agreed to proceed.  History of Present Illness:    Observations/Objective:   Assessment and Plan:   Follow Up Instructions:    I discussed the assessment and treatment plan with the patient. The patient was provided an opportunity to ask questions and all were answered. The patient agreed with the plan and demonstrated an understanding of the instructions.   The patient was advised to call back or seek an in-person evaluation if the symptoms worsen or if the condition fails to improve as anticipated.  I provided 15 minutes of non-face-to-face time during this encounter.   [name], MD   Review of Systems  Constitutional: Negative for diaphoresis and fatigue.  HENT: Negative for congestion and rhinorrhea.   Respiratory: Negative for cough and shortness of breath.   Cardiovascular: Negative for chest pain and leg swelling.  Gastrointestinal: Positive for abdominal pain. Negative for diarrhea and nausea.  Skin: Negative for color change and rash.  Neurological: Negative for dizziness and headaches.  Psychiatric/Behavioral: Negative for  behavioral problems and confusion.  See above     Objective:   Physical Exam    Today's visit was via telephone Physical exam was not possible for this visit     Assessment & Plan:  Sharp abdominal pain Likelihood of this being kidney infection is low But because patient is concerned and symptoms are very similar we will go ahead with antibiotics for 1 week if he is not dramatically better within 1 week he needs to follow-up patient understands this also he understands if he gets worse he needs follow-up

## 2019-08-31 NOTE — Telephone Encounter (Signed)
I did do a phone consultation with the patient.  Please see documentation.  Antibiotic sent in.  If patient not dramatically better within the next 7 days he will do a follow-up.

## 2019-08-31 NOTE — Telephone Encounter (Signed)
So he needs to do a follow-up visit even if it is by phone may schedule for [phone]

## 2019-08-31 NOTE — Telephone Encounter (Signed)
Going on about 4-5 days. Not having any trouble urinating. Pt states when he turns or coughs, his left side hurts. Pt states he has had kidney infections before and these were his symptoms at that time. Pt would like something called in. Please advise. (pt cell number [phone] if calling back this afternoon)

## 2019-08-31 NOTE — Telephone Encounter (Signed)
Pt is having kidney infection. States his left side is sore to touch and it feels swollen.   [place], [place]

## 2019-09-04 ENCOUNTER — Other Ambulatory Visit: Payer: Self-pay | Admitting: Family Medicine

## 2019-09-04 NOTE — Telephone Encounter (Signed)
Last med check 05/12/19

## 2019-09-06 NOTE — Telephone Encounter (Signed)
Please confirm with patient his dosing and go ahead and approve for 5 refills thank you

## 2019-09-08 NOTE — Telephone Encounter (Signed)
Contacted patient. Pt states he is doing 80 units at 10 pm. Refills sent to pharmacy

## 2019-09-11 ENCOUNTER — Ambulatory Visit (INDEPENDENT_AMBULATORY_CARE_PROVIDER_SITE_OTHER): Payer: Medicare HMO | Admitting: Family Medicine

## 2019-09-11 ENCOUNTER — Other Ambulatory Visit: Payer: Self-pay

## 2019-09-11 ENCOUNTER — Encounter: Payer: Self-pay | Admitting: Family Medicine

## 2019-09-11 VITALS — BP 128/76 | Temp 96.2°F | Wt 255.0 lb

## 2019-09-11 DIAGNOSIS — I10 Essential (primary) hypertension: Secondary | ICD-10-CM | POA: Diagnosis not present

## 2019-09-11 DIAGNOSIS — E1121 Type 2 diabetes mellitus with diabetic nephropathy: Secondary | ICD-10-CM

## 2019-09-11 DIAGNOSIS — E118 Type 2 diabetes mellitus with unspecified complications: Secondary | ICD-10-CM | POA: Diagnosis not present

## 2019-09-11 DIAGNOSIS — R300 Dysuria: Secondary | ICD-10-CM

## 2019-09-11 DIAGNOSIS — N1831 Chronic kidney disease, stage 3a: Secondary | ICD-10-CM

## 2019-09-11 DIAGNOSIS — Z125 Encounter for screening for malignant neoplasm of prostate: Secondary | ICD-10-CM

## 2019-09-11 DIAGNOSIS — R109 Unspecified abdominal pain: Secondary | ICD-10-CM | POA: Diagnosis not present

## 2019-09-11 DIAGNOSIS — E1149 Type 2 diabetes mellitus with other diabetic neurological complication: Secondary | ICD-10-CM

## 2019-09-11 DIAGNOSIS — E113393 Type 2 diabetes mellitus with moderate nonproliferative diabetic retinopathy without macular edema, bilateral: Secondary | ICD-10-CM | POA: Diagnosis not present

## 2019-09-11 DIAGNOSIS — E1122 Type 2 diabetes mellitus with diabetic chronic kidney disease: Secondary | ICD-10-CM

## 2019-09-11 DIAGNOSIS — IMO0002 Reserved for concepts with insufficient information to code with codable children: Secondary | ICD-10-CM

## 2019-09-11 DIAGNOSIS — Z794 Long term (current) use of insulin: Secondary | ICD-10-CM

## 2019-09-11 DIAGNOSIS — E1165 Type 2 diabetes mellitus with hyperglycemia: Secondary | ICD-10-CM

## 2019-09-11 LAB — POCT URINALYSIS DIPSTICK
Protein, UA: POSITIVE — AB
Spec Grav, UA: 1.02 (ref 1.010–1.025)
pH, UA: 7 (ref 5.0–8.0)

## 2019-09-11 NOTE — Progress Notes (Signed)
Subjective:    Patient ID: Eugene [name], male    DOB: [birth year], 64 y.o.   MRN: [id number]  Urinary Tract Infection  This is a new problem. The current episode started 1 to 4 weeks ago. The quality of the pain is described as burning and aching. Associated symptoms comments: Left side pain; tender when pushing in on left side. Unable to get comfortable. He has tried antibiotics for the symptoms.   Results for orders placed or performed in visit on 09/11/19  POCT Urinalysis Dipstick  Result Value Ref Range   Color, UA     Clarity, UA     Glucose, UA     Bilirubin, UA     Ketones, UA     Spec Grav, UA 1.020 1.010 - 1.025   Blood, UA     pH, UA 7.0 5.0 - 8.0   Protein, UA Positive (A) Negative   Urobilinogen, UA     Nitrite, UA     Leukocytes, UA Trace (A) Negative   Appearance     Odor     *Note: Due to a large number of results and/or encounters for the requested time period, some results have not been displayed. A complete set of results can be found in Results Review.   AWV- Annual Wellness Visit  The patient was seen for their annual wellness visit. The patient's past medical history, surgical history, and family history were reviewed. Pertinent vaccines were reviewed ( tetanus, pneumonia, shingles, flu) The patient's medication list was reviewed and updated.  The height and weight were entered.  BMI recorded in electronic record elsewhere  Cognitive screening was completed. Outcome of Mini - Cog: Passes normal   Falls /depression screening electronically recorded within record elsewhere  Current tobacco usage: Does not smoke (All patients who use tobacco were given written and verbal information on quitting)  Recent listing of emergency department/hospitalizations over the past year were reviewed.  current specialist the patient sees on a regular basis: He sees cardiology on a regular basis   Medicare annual wellness visit patient questionnaire was  reviewed.  A written screening schedule for the patient for the next 5-10 years was given. Appropriate discussion of followup regarding next visit was discussed.      Review of Systems  Constitutional: Negative for diaphoresis and fatigue.  HENT: Negative for congestion and rhinorrhea.   Respiratory: Negative for cough and shortness of breath.   Cardiovascular: Negative for chest pain and leg swelling.  Gastrointestinal: Negative for abdominal pain and diarrhea.  Skin: Negative for color change and rash.  Neurological: Negative for dizziness and headaches.  Psychiatric/Behavioral: Negative for behavioral problems and confusion.       Objective:   Physical Exam Vitals reviewed.  Constitutional:      General: He is not in acute distress. HENT:     Head: Normocephalic and atraumatic.  Eyes:     General:        Right eye: No discharge.        Left eye: No discharge.  Neck:     Trachea: No tracheal deviation.  Cardiovascular:     Rate and Rhythm: Normal rate and regular rhythm.     Heart sounds: Normal heart sounds. No murmur.  Pulmonary:     Effort: Pulmonary effort is normal. No respiratory distress.     Breath sounds: Normal breath sounds.  Lymphadenopathy:     Cervical: No cervical adenopathy.  Skin:    General: Skin is warm  and dry.  Neurological:     Mental Status: He is alert.     Coordination: Coordination normal.  Psychiatric:        Behavior: Behavior normal.   Prostate exam normal   Dense neuropathy in both feet     Assessment & Plan:  1. Dysuria Patient having some urinary frequency urine under the microscope looks good we will send it off for culture no need for antibiotics currently he has some soreness on the left side but not in the flank I believe that this is a pulled muscle from helping his wife up from the chair - POCT Urinalysis Dipstick - Urine Culture  2. Essential hypertension Blood pressure good control currently continue current  measures.  Watch diet closely stay physically active minimize salt - Hemoglobin A1c - PSA - Urine Microalbumin w/creat. ratio  3. Type 2 diabetes mellitus with neurological manifestations [facility]) He states his sugars been doing better since the change in medicines he will check an A1c in April continue to watch diet and take medicines - Hemoglobin A1c - PSA - Urine Microalbumin w/creat. ratio  4. Moderate nonproliferative diabetic retinopathy of both eyes without macular edema associated with type 2 diabetes mellitus ([place]) No visual issues today he does follow-up with a specialist on a regular basis  5. Type 2 diabetes mellitus with stage 3a chronic kidney disease, with long-term current use of insulin ([facility]) His creatinine looks stable.  Continue current measures check urine micro on the next lab work - Hemoglobin A1c - PSA - Urine Microalbumin w/creat. ratio  6. Left flank pain Left flank pain I really feel it is due to pulling his wife up from the chair he felt pain and discomfort with that he is somewhat sore in the muscles on the left side - Urine Culture  7. Screening PSA (prostate specific antigen) Prostate exam was done today PSA ordered. - PSA Adult wellness-complete.wellness physical was conducted today. Importance of diet and exercise were discussed in detail.  In addition to this a discussion regarding safety was also covered. We also reviewed over immunizations and gave recommendations regarding current immunization needed for age.  In addition to this additional areas were also touched on including: Preventative health exams needed:  Colonoscopy he is due later this year I would recommend waiting till fall time  Patient was advised yearly wellness exam

## 2019-09-11 NOTE — Patient Instructions (Signed)
Handwritten sheet was given to the patient He was encouraged to get Covid vaccine when available Hold off on shingles vaccine currently Colonoscopy later this fall Lab work ordered for April

## 2019-09-13 LAB — URINE CULTURE

## 2019-09-15 ENCOUNTER — Other Ambulatory Visit: Payer: Self-pay | Admitting: Family Medicine

## 2019-09-15 NOTE — Telephone Encounter (Signed)
Med check 05/12/19

## 2019-09-20 ENCOUNTER — Emergency Department (HOSPITAL_COMMUNITY)
Admission: EM | Admit: 2019-09-20 | Discharge: 2019-09-20 | Disposition: A | Discharge status: Home / Self Care | Payer: Medicare HMO | Attending: Emergency Medicine | Admitting: Emergency Medicine

## 2019-09-20 ENCOUNTER — Encounter (HOSPITAL_COMMUNITY): Payer: Self-pay | Admitting: *Deleted

## 2019-09-20 ENCOUNTER — Emergency Department (HOSPITAL_COMMUNITY): Payer: Medicare HMO

## 2019-09-20 ENCOUNTER — Other Ambulatory Visit: Payer: Self-pay

## 2019-09-20 DIAGNOSIS — I130 Hypertensive heart and chronic kidney disease with heart failure and stage 1 through stage 4 chronic kidney disease, or unspecified chronic kidney disease: Secondary | ICD-10-CM | POA: Insufficient documentation

## 2019-09-20 DIAGNOSIS — Z7982 Long term (current) use of aspirin: Secondary | ICD-10-CM | POA: Diagnosis not present

## 2019-09-20 DIAGNOSIS — I252 Old myocardial infarction: Secondary | ICD-10-CM | POA: Insufficient documentation

## 2019-09-20 DIAGNOSIS — K8020 Calculus of gallbladder without cholecystitis without obstruction: Secondary | ICD-10-CM | POA: Diagnosis not present

## 2019-09-20 DIAGNOSIS — Z794 Long term (current) use of insulin: Secondary | ICD-10-CM | POA: Diagnosis not present

## 2019-09-20 DIAGNOSIS — I10 Essential (primary) hypertension: Secondary | ICD-10-CM

## 2019-09-20 DIAGNOSIS — I2510 Atherosclerotic heart disease of native coronary artery without angina pectoris: Secondary | ICD-10-CM | POA: Insufficient documentation

## 2019-09-20 DIAGNOSIS — R739 Hyperglycemia, unspecified: Secondary | ICD-10-CM

## 2019-09-20 DIAGNOSIS — E1165 Type 2 diabetes mellitus with hyperglycemia: Secondary | ICD-10-CM | POA: Diagnosis not present

## 2019-09-20 DIAGNOSIS — N200 Calculus of kidney: Secondary | ICD-10-CM | POA: Diagnosis not present

## 2019-09-20 DIAGNOSIS — I313 Pericardial effusion (noninflammatory): Secondary | ICD-10-CM | POA: Diagnosis not present

## 2019-09-20 DIAGNOSIS — N183 Chronic kidney disease, stage 3 unspecified: Secondary | ICD-10-CM | POA: Diagnosis not present

## 2019-09-20 DIAGNOSIS — R161 Splenomegaly, not elsewhere classified: Secondary | ICD-10-CM

## 2019-09-20 DIAGNOSIS — J449 Chronic obstructive pulmonary disease, unspecified: Secondary | ICD-10-CM | POA: Insufficient documentation

## 2019-09-20 DIAGNOSIS — I3139 Other pericardial effusion (noninflammatory): Secondary | ICD-10-CM

## 2019-09-20 DIAGNOSIS — E1122 Type 2 diabetes mellitus with diabetic chronic kidney disease: Secondary | ICD-10-CM | POA: Diagnosis not present

## 2019-09-20 DIAGNOSIS — Z79899 Other long term (current) drug therapy: Secondary | ICD-10-CM | POA: Diagnosis not present

## 2019-09-20 DIAGNOSIS — I5032 Chronic diastolic (congestive) heart failure: Secondary | ICD-10-CM | POA: Diagnosis not present

## 2019-09-20 DIAGNOSIS — I5022 Chronic systolic (congestive) heart failure: Secondary | ICD-10-CM | POA: Insufficient documentation

## 2019-09-20 DIAGNOSIS — R109 Unspecified abdominal pain: Secondary | ICD-10-CM | POA: Insufficient documentation

## 2019-09-20 DIAGNOSIS — R10A2 Flank pain, left side: Secondary | ICD-10-CM

## 2019-09-20 LAB — COMPREHENSIVE METABOLIC PANEL
ALT: 16 U/L (ref 0–44)
AST: 22 U/L (ref 15–41)
Albumin: 3.7 g/dL (ref 3.5–5.0)
Alkaline Phosphatase: 78 U/L (ref 38–126)
Anion gap: 9 (ref 5–15)
BUN: 40 mg/dL — ABNORMAL HIGH (ref 8–23)
CO2: 26 mmol/L (ref 22–32)
Calcium: 9.2 mg/dL (ref 8.9–10.3)
Chloride: 98 mmol/L (ref 98–111)
Creatinine, Ser: 1.62 mg/dL — ABNORMAL HIGH (ref 0.61–1.24)
GFR calc Af Amer: 52 mL/min — ABNORMAL LOW (ref >60)
GFR calc non Af Amer: 45 mL/min — ABNORMAL LOW (ref >60)
Glucose, Bld: 340 mg/dL — ABNORMAL HIGH (ref 70–99)
Potassium: 4.7 mmol/L (ref 3.5–5.1)
Sodium: 133 mmol/L — ABNORMAL LOW (ref 135–145)
Total Bilirubin: 1.1 mg/dL (ref 0.3–1.2)
Total Protein: 7.1 g/dL (ref 6.5–8.1)

## 2019-09-20 LAB — CBC WITH DIFFERENTIAL/PLATELET
Abs Immature Granulocytes: 0.05 10*3/uL (ref 0.00–0.07)
Basophils Absolute: 0 10*3/uL (ref 0.0–0.1)
Basophils Relative: 1 %
Eosinophils Absolute: 0.1 10*3/uL (ref 0.0–0.5)
Eosinophils Relative: 2 %
HCT: 39.3 % (ref 39.0–52.0)
Hemoglobin: 12.6 g/dL — ABNORMAL LOW (ref 13.0–17.0)
Immature Granulocytes: 1 %
Lymphocytes Relative: 20 %
Lymphs Abs: 1 10*3/uL (ref 0.7–4.0)
MCH: 29 pg (ref 26.0–34.0)
MCHC: 32.1 g/dL (ref 30.0–36.0)
MCV: 90.3 fL (ref 80.0–100.0)
Monocytes Absolute: 0.6 10*3/uL (ref 0.1–1.0)
Monocytes Relative: 11 %
Neutro Abs: 3.2 10*3/uL (ref 1.7–7.7)
Neutrophils Relative %: 65 %
Platelets: 212 10*3/uL (ref 150–400)
RBC: 4.35 MIL/uL (ref 4.22–5.81)
RDW: 15.9 % — ABNORMAL HIGH (ref 11.5–15.5)
WBC: 4.9 10*3/uL (ref 4.0–10.5)
nRBC: 0 % (ref 0.0–0.2)

## 2019-09-20 LAB — URINALYSIS, ROUTINE W REFLEX MICROSCOPIC
Bacteria, UA: NONE SEEN
Bilirubin Urine: NEGATIVE
Glucose, UA: >=500 mg/dL — AB
Hgb urine dipstick: NEGATIVE
Ketones, ur: NEGATIVE mg/dL
Leukocytes,Ua: NEGATIVE
Nitrite: NEGATIVE
Protein, ur: 100 mg/dL — AB
Specific Gravity, Urine: 1.007 (ref 1.005–1.030)
pH: 7 (ref 5.0–8.0)

## 2019-09-20 MED ORDER — METHOCARBAMOL 750 MG PO TABS: 750.0000 mg | ORAL_TABLET | Freq: Three times a day (TID) | ORAL | 0 refills | Status: DC | PRN

## 2019-09-20 MED ORDER — INSULIN ASPART 100 UNIT/ML ~~LOC~~ SOLN
8.0000 [IU] | Freq: Once | SUBCUTANEOUS | Status: AC
  Administered 2019-09-20: 21:00:00 8 [IU] via SUBCUTANEOUS
  Filled 2019-09-20: qty 1

## 2019-09-20 MED ORDER — ONDANSETRON HCL 4 MG/2ML IJ SOLN
4.0000 mg | Freq: Once | INTRAMUSCULAR | Status: AC
  Administered 2019-09-20: 4 mg via INTRAVENOUS
  Filled 2019-09-20: qty 2

## 2019-09-20 MED ORDER — HYDROMORPHONE HCL 1 MG/ML IJ SOLN
1.0000 mg | Freq: Once | INTRAMUSCULAR | Status: AC
  Administered 2019-09-20: 1 mg via INTRAVENOUS
  Filled 2019-09-20: qty 1

## 2019-09-20 NOTE — Discharge Instructions (Addendum)
It was our pleasure to provide your ER care today - we hope that you feel better.  You may try robaxin as need for muscle pain/spasm - no driving for the next 6 hours, or when taking robaxin.   Your ct scan was read as showing:  IMPRESSION: 1. There are multiple nonobstructing stones in the bilateral kidneys. No stone in ureteral or causing obstruction. 2. There is a moderate-sized pericardial effusion which has increased in size since 2015. 3. Splenomegaly. 4. Cholelithiasis without acute inflammation.   As relates the incidental findings noted on CT above (pericardial effusion, gallstones, enlarged spleen) - follow up with primary care doctor in the next 1-2 weeks.   From today's labs, your glucose is high, 340. Drink plenty of water, follow diabetic meal plan, continue your diabetic medication, and follow up with your doctor in the next 1-2 weeks.  Your blood pressure is also high - follow up with your doctor.   Return to ER if worse, new symptoms, fevers, worsening or severe pain, persistent vomiting, weak/faint, chest pain, trouble breathing, or other concern.

## 2019-09-20 NOTE — ED Provider Notes (Signed)
[facility] EMERGENCY DEPARTMENT Provider Note   CSN: [id number] Arrival date & time: 09/20/19  1815     History Chief Complaint  Patient presents with  . Flank Pain    Eugene [name] is a 64 y.o. male.  Patient c/o acute onset left flank pain 1 week ago, at rest. States saw pcp and was treated for possible muscle strain, but pt states pain persists. Pain acute onset, severe, constant, waxing and waning, left flank radiating towards left groin. No hematuria or dysuria. Remote hx kidney stone, pt states similar. No fever or chills. +nausea. No vomiting.   The history is provided by the patient.  Flank Pain Pertinent negatives include no chest pain, no abdominal pain, no headaches and no shortness of breath.       Past Medical History:  Diagnosis Date  . Anemia   . Anxiety   . Arteriosclerotic cardiovascular disease (ASCVD)    a. 05/2011 s/p DES to PDA and RCA. b. 08/2012 Inflat  STEMI/Cath/PCI: LM minor irregs, LAD 50p, D1 50, LCX nl, OM1 25, RCA 30-40p, 100d (treated with 2.75x41mm Promus Premier DES);    [name] Asthma   . Bell palsy   . C. difficile colitis    a. 08/2012  . Cholelithiasis 07/2012   Asymptomatic; identified incidentally  . Chronic diastolic CHF (congestive heart failure) ([place])   . CKD (chronic kidney disease), stage III   . Contrast dye induced nephropathy    a. 08/2012 post cath/pci  . COPD (chronic obstructive pulmonary disease) ([place])   . Diabetes mellitus    Peripheral neuropathy  . Gallstones   . GERD (gastroesophageal reflux disease)   . Hyperlipidemia   . Hypertension   . Myocardial infarct ([place]) 09/08/12  . Nephrolithiasis   . Old myocardial infarction   . Pericardial effusion    a. s/p window in 2014. b. s/p redo window in 09/2018.  [name] Peripheral neuropathy   . PONV (postoperative nausea and vomiting)   . Sleep apnea   . Statin intolerance     Patient Active Problem List   Diagnosis Date Noted  . Diabetes mellitus due to underlying  condition, uncontrolled, with hyperglycemia, with long-term current use of insulin ([place]) 02/24/2019  . Acute exacerbation of CHF (congestive heart failure) /dCHF 02/22/2019  . S/P pericardial window creation 10/01/2018  . DM type 2 causing CKD stage 3 ([place]) 11/14/2016  . Acute on chronic systolic congestive heart failure ([place])   . Cardiomyopathy ([place])   . Acute on chronic diastolic CHF (congestive heart failure) ([place]) 11/10/2016  . Elevated troponin   . Coronary artery disease involving native coronary artery of native heart without angina pectoris   . Moderate nonproliferative diabetic retinopathy of both eyes without macular edema associated with type 2 diabetes mellitus ([place]) 10/22/2016  . Hyperkalemia 06/14/2016  . Non-ST elevation myocardial infarction (NSTEMI) ([place])   . Morbid obesity ([place])   . Diabetes mellitus type 2, uncontrolled, with complications ([place]) 06/12/5101  . Right carotid bruit 11/10/2015  . Severe hypertension 11/09/2015  . Hypertensive urgency 11/09/2015  . Gout of wrist 08/08/2015  . Pericardial effusion without cardiac tamponade 05/26/2015  . HLD (hyperlipidemia) 05/26/2015  . Anxiety 05/26/2015  . Olecranon bursitis   . Inflammatory arthritis   . Elbow joint pain 01/21/2015  . SOB (shortness of breath) on exertion 12/26/2014  . Constipation 10/26/2014  . Peripheral edema 01/29/2014  . Acute on chronic diastolic heart failure ([place]) 01/26/2014  . Chest pain 12/13/2013  . SOB (  shortness of breath) 10/16/2013  . Chronic diastolic CHF (congestive heart failure) ([place]) 10/15/2013  . NSVT (nonsustained ventricular tachycardia) ([place]) 06/26/2013  . Type 2 diabetes mellitus with neurological manifestations ([place]) 06/26/2013  . Acute diastolic CHF (congestive heart failure) ([place]) 06/26/2013  . Recurrent Pericardial effusion/pericardial window on 06/23/2013 and 10/01/2018 06/26/2013  . Old myocardial infarction   . Gastroparesis due to DM ([place]) 05/21/2013  . CAD  (coronary artery disease) 09/16/2012  . Acute on chronic renal insufficiency 09/14/2012  . Edema 08/18/2012  . Anemia, normocytic normochromic 07/31/2012  . Cholelithiasis 07/27/2012  . GERD (gastroesophageal reflux disease) 05/10/2012  . Noncompliance 05/09/2012  . Peripheral neuropathy 10/24/2011  . Obstructive sleep apnea- on C-pap 08/23/2011  . CAD- MI/RCA PCI-DES x2 2012, and RCA DES Jan 2014   . Hyperlipidemia   . Cholelithiasis 05/28/2011  . Essential hypertension 05/25/2011    Past Surgical History:  Procedure Laterality Date  . CARDIAC CATHETERIZATION    . CHEST TUBE INSERTION    . CIRCUMCISION    . COLONOSCOPY     In [facility], approximately 2011 per patient, was normal. Advised to come back in 10 years.  . ESOPHAGOGASTRODUODENOSCOPY     [place] over 20 yrs ago  . ESOPHAGOGASTRODUODENOSCOPY  06/12/2012   [id number] esophagus-status post Venia Minks dilation. Abnormal gastric mucosa of uncertain significance-status post biopsy  . INTRAOPERATIVE TRANSESOPHAGEAL ECHOCARDIOGRAM N/A 06/23/2013   Procedure: INTRAOPERATIVE TRANSESOPHAGEAL ECHOCARDIOGRAM;  Surgeon: Grace Isaac, MD;  Location: [place];  Service: Open Heart Surgery;  Laterality: N/A;  . LEFT HEART CATHETERIZATION WITH CORONARY ANGIOGRAM N/A 09/08/2012   Procedure: LEFT HEART CATHETERIZATION WITH CORONARY ANGIOGRAM;  Surgeon: [name], MD;  Location[facility] CATH LAB;  Service: Cardiovascular;  Laterality: N/A;  . stents    . SUBXYPHOID PERICARDIAL WINDOW N/A 06/23/2013   Procedure: SUBXYPHOID PERICARDIAL WINDOW;  Surgeon: Grace Isaac, MD;  Location: [name];  Service: Thoracic;  Laterality: N/A;  . SUBXYPHOID PERICARDIAL WINDOW  10/01/2018  . SUBXYPHOID PERICARDIAL WINDOW N/A 10/01/2018   Procedure: REDO SUBXYPHOID PERICARDIAL WINDOW;  Surgeon: Grace Isaac, MD;  Location: [place];  Service: Thoracic;  Laterality: N/A;  . TEE WITHOUT CARDIOVERSION N/A 10/01/2018   Procedure: TRANSESOPHAGEAL  ECHOCARDIOGRAM (TEE);  Surgeon: Grace Isaac, MD;  Location: [facility] OR;  Service: Thoracic;  Laterality: N/A;       Family History  Problem Relation Age of Onset  . Diabetes Mother   . Heart attack Mother   . Stroke Mother   . Diabetes Sister   . Sleep apnea Sister   . Hypertension Brother   . Diabetes Brother   . Diabetes Brother   . Hypertension Brother   . Colon cancer Neg Hx   . Liver disease Neg Hx     Social History   Tobacco Use  . Smoking status: Never Smoker  . Smokeless tobacco: Never Used  Substance Use Topics  . Alcohol use: No    Alcohol/week: 0.0 standard drinks    Comment: heavy etoh use 30 years ago  . Drug use: No    Home Medications Prior to Admission medications   Medication Sig Start Date End Date Taking? Authorizing Provider  ACCU-CHEK SMARTVIEW test strip TEST BLOOD SUGAR ONE TIME DAILY 03/10/19   [name], MD  acetaminophen (TYLENOL) 500 MG tablet Take 1,500 mg by mouth every 6 (six) hours as needed for mild pain, fever or headache.     [provider]  allopurinol (ZYLOPRIM) 100 MG  tablet Take one tablet by mouth daily 06/19/18   [name], MD  ALPRAZolam Duanne Moron) 0.5 MG tablet 1/2 to 1 qhs prn insomnia 05/12/19   [name], MD  aspirin 81 MG chewable tablet Chew 81 mg by mouth at bedtime.    [provider]  Cholecalciferol (VITAMIN D3 PO) Take 1 capsule by mouth every other day.    [provider]  colchicine 0.6 MG tablet 1 twice daily as needed for gout flareup 07/30/19   [name], MD  FLUCELVAX QUADRIVALENT 0.5 ML injection  05/26/19   [provider]  glimepiride (AMARYL) 1 MG tablet Take one tablet po with meals. Max 3/ day 08/10/19   [name], MD  Insulin Pen Needle (BD PEN NEEDLE NANO U/F) 32G X 4 MM MISC Use once daily as directed 05/02/17   [name], MD  isosorbide mononitrate (IMDUR) 60 MG 24 hr tablet TAKE 1 TABLET (60 MG TOTAL) BY MOUTH DAILY. 11/10/18   Herminio Commons, MD  LEVEMIR FLEXTOUCH 100 UNIT/ML Pen INJECT 80 UNITS INTO THE SKIN DAILY AT 10 PM. 09/08/19   [name], MD  losartan (COZAAR) 25 MG tablet TAKE 1 TABLET (25 MG TOTAL) BY MOUTH DAILY. 03/16/19   Herminio Commons, MD  metoprolol tartrate (LOPRESSOR) 25 MG tablet TAKE 1 TABLET TWICE DAILY 05/28/19   [name], MD  NONFORMULARY OR COMPOUNDED ITEM Antifungal solution: Terbinafine 3%, Fluconazole 2%, Tea Tree Oil 5%, Urea 10%, Ibuprofen 2% in DMSO suspension #53mL 08/07/19   [place], Stephani Police, DPM  ofloxacin (FLOXIN) 0.3 % OTIC solution PLACE 5 DROPS INTO THE LEFT EAR 2 (TWO) TIMES DAILY. 01/09/19   Mikey Kirschner, MD  spironolactone (ALDACTONE) 25 MG tablet Take 0.5 tablets (12.5 mg total) by mouth daily. 02/25/19   Dhungel, Flonnie Overman, MD  torsemide (DEMADEX) 20 MG tablet TAKE 1 TO 2 TABLETS EACH MORNING AS DIRECTED BY PHYSICIAN 07/27/19   [place], Elayne Snare, MD  traMADol (ULTRAM) 50 MG tablet TAKE 1 TABLET (50 MG TOTAL) BY MOUTH EVERY 6 (SIX) HOURS AS NEEDED 09/15/19   [name], MD    Allergies    Hydrocodone, Lisinopril, Neurontin [gabapentin], Statins, Metformin and related, and Norvasc [amlodipine besylate]  Review of Systems   Review of Systems  Constitutional: Negative for fever.  HENT: Negative for sore throat.   Eyes: Negative for redness.  Respiratory: Negative for cough and shortness of breath.   Cardiovascular: Negative for chest pain.  Gastrointestinal: Positive for nausea. Negative for abdominal pain and vomiting.  Genitourinary: Positive for flank pain. Negative for dysuria.  Musculoskeletal: Negative for neck pain.  Skin: Negative for rash.  Neurological: Negative for headaches.  Hematological: Does not bruise/bleed easily.  Psychiatric/Behavioral: Negative for confusion.    Physical Exam Updated Vital Signs BP (!) 203/105 (BP Location: Left Arm)   Pulse 98   Temp 97.8 F (36.6 C) (Oral)   Resp 20   Ht 1.854 m (6\' 1" )   Wt 117.5 kg   SpO2  97%   BMI 34.17 kg/m   Physical Exam Vitals and nursing note reviewed.  Constitutional:      Appearance: Normal appearance. He is well-developed.  HENT:     Head: Atraumatic.     Nose: Nose normal.     Mouth/Throat:     Mouth: Mucous membranes are moist.  Eyes:     General: No scleral icterus.    Conjunctiva/sclera: Conjunctivae normal.  Neck:  Trachea: No tracheal deviation.  Cardiovascular:     Rate and Rhythm: Normal rate.     Pulses: Normal pulses.  Pulmonary:     Effort: Pulmonary effort is normal. No accessory muscle usage or respiratory distress.  Abdominal:     General: Bowel sounds are normal. There is no distension.     Palpations: Abdomen is soft. There is no mass.     Tenderness: There is no abdominal tenderness. There is no guarding or rebound.     Hernia: No hernia is present.  Genitourinary:    Comments: No cva tenderness. Musculoskeletal:        General: No swelling.     Cervical back: Normal range of motion and neck supple. No rigidity.  Skin:    General: Skin is warm and dry.     Findings: No rash.  Neurological:     Mental Status: He is alert.     Comments: Alert, speech clear.   Psychiatric:        Mood and Affect: Mood normal.     ED Results / Procedures / Treatments   Labs (all labs ordered are listed, but only abnormal results are displayed) Results for orders placed or performed during the hospital encounter of 09/20/19  Urinalysis, Routine w reflex microscopic  Result Value Ref Range   Color, Urine STRAW (A) YELLOW   APPearance CLEAR CLEAR   Specific Gravity, Urine 1.007 1.005 - 1.030   pH 7.0 5.0 - 8.0   Glucose, UA >=500 (A) NEGATIVE mg/dL   Hgb urine dipstick NEGATIVE NEGATIVE   Bilirubin Urine NEGATIVE NEGATIVE   Ketones, ur NEGATIVE NEGATIVE mg/dL   Protein, ur 100 (A) NEGATIVE mg/dL   Nitrite NEGATIVE NEGATIVE   Leukocytes,Ua NEGATIVE NEGATIVE   RBC / HPF 0-5 0 - 5 RBC/hpf   WBC, UA 0-5 0 - 5 WBC/hpf   Bacteria, UA NONE  SEEN NONE SEEN   Hyaline Casts, UA PRESENT   CBC with Differential/Platelet  Result Value Ref Range   WBC 4.9 4.0 - 10.5 K/uL   RBC 4.35 4.22 - 5.81 MIL/uL   Hemoglobin 12.6 (L) 13.0 - 17.0 g/dL   HCT 39.3 39.0 - 52.0 %   MCV 90.3 80.0 - 100.0 fL   MCH 29.0 26.0 - 34.0 pg   MCHC 32.1 30.0 - 36.0 g/dL   RDW 15.9 (H) 11.5 - 15.5 %   Platelets 212 150 - 400 K/uL   nRBC 0.0 0.0 - 0.2 %   Neutrophils Relative % 65 %   Neutro Abs 3.2 1.7 - 7.7 K/uL   Lymphocytes Relative 20 %   Lymphs Abs 1.0 0.7 - 4.0 K/uL   Monocytes Relative 11 %   Monocytes Absolute 0.6 0.1 - 1.0 K/uL   Eosinophils Relative 2 %   Eosinophils Absolute 0.1 0.0 - 0.5 K/uL   Basophils Relative 1 %   Basophils Absolute 0.0 0.0 - 0.1 K/uL   Immature Granulocytes 1 %   Abs Immature Granulocytes 0.05 0.00 - 0.07 K/uL  Comprehensive metabolic panel  Result Value Ref Range   Sodium 133 (L) 135 - 145 mmol/L   Potassium 4.7 3.5 - 5.1 mmol/L   Chloride 98 98 - 111 mmol/L   CO2 26 22 - 32 mmol/L   Glucose, Bld 340 (H) 70 - 99 mg/dL   BUN 40 (H) 8 - 23 mg/dL   Creatinine, Ser 1.62 (H) 0.61 - 1.24 mg/dL   Calcium 9.2 8.9 - 10.3 mg/dL   Total  Protein 7.1 6.5 - 8.1 g/dL   Albumin 3.7 3.5 - 5.0 g/dL   AST 22 15 - 41 U/L   ALT 16 0 - 44 U/L   Alkaline Phosphatase 78 38 - 126 U/L   Total Bilirubin 1.1 0.3 - 1.2 mg/dL   GFR calc non Af Amer 45 (L) >60 mL/min   GFR calc Af Amer 52 (L) >60 mL/min   Anion gap 9 5 - 15   *Note: Due to a large number of results and/or encounters for the requested time period, some results have not been displayed. A complete set of results can be found in Results Review.   CT Renal Stone Study  Result Date: 09/20/2019 CLINICAL DATA:  Left flank pain. EXAM: CT ABDOMEN AND PELVIS WITHOUT CONTRAST TECHNIQUE: Multidetector CT imaging of the abdomen and pelvis was performed following the standard protocol without IV contrast. COMPARISON:  10/17/2013 FINDINGS: Lower chest: There are linear airspace  opacities at the lung bases bilaterally, right worse than left. The heart size is borderline enlarged. The intracardiac blood pool is hypodense relative to the adjacent myocardium consistent with anemia.There is a moderate-sized partially visualized pericardial effusion. This has increased in size since 2015. Hepatobiliary: The liver is normal. Cholelithiasis without acute inflammation.There is no biliary ductal dilation. Pancreas: Normal contours without ductal dilatation. No peripancreatic fluid collection. Spleen: The spleen is enlarged measuring approximately 14.6 cm craniocaudad. Adrenals/Urinary Tract: --Adrenal glands: No adrenal hemorrhage. --Right kidney/ureter: There are multiple nonobstructing stones in the right kidney. --Left kidney/ureter: There are multiple nonobstructing stones in the left kidney. --Urinary bladder: Unremarkable. Stomach/Bowel: --Stomach/Duodenum: No hiatal hernia or other gastric abnormality. Normal duodenal course and caliber. --Small bowel: No dilatation or inflammation. --Colon: No focal abnormality. --Appendix: Normal. Vascular/Lymphatic: Atherosclerotic calcification is present within the non-aneurysmal abdominal aorta, without hemodynamically significant stenosis. --No retroperitoneal lymphadenopathy. --No mesenteric lymphadenopathy. --No pelvic or inguinal lymphadenopathy. Reproductive: The prostate gland is enlarged. Other: No ascites or free air. There is skin thickening involving the anterior abdominal wall bilaterally which may related to multiple subcutaneous injections this location. Musculoskeletal. No acute displaced fractures. IMPRESSION: 1. There are multiple nonobstructing stones in the bilateral kidneys. No hydronephrosis or hydroureter. 2. There is a moderate-sized pericardial effusion which has increased in size since 2015. 3. Splenomegaly. 4. Cholelithiasis without acute inflammation. 5. The intracardiac blood pool is hypodense relative to the adjacent  myocardium consistent with anemia. Aortic Atherosclerosis (ICD10-I70.0). Electronically Signed   By: Constance Holster M.D.   On: 09/20/2019 19:19   EKG None  Radiology CT Renal Stone Study  Result Date: 09/20/2019 CLINICAL DATA:  Left flank pain. EXAM: CT ABDOMEN AND PELVIS WITHOUT CONTRAST TECHNIQUE: Multidetector CT imaging of the abdomen and pelvis was performed following the standard protocol without IV contrast. COMPARISON:  10/17/2013 FINDINGS: Lower chest: There are linear airspace opacities at the lung bases bilaterally, right worse than left. The heart size is borderline enlarged. The intracardiac blood pool is hypodense relative to the adjacent myocardium consistent with anemia.There is a moderate-sized partially visualized pericardial effusion. This has increased in size since 2015. Hepatobiliary: The liver is normal. Cholelithiasis without acute inflammation.There is no biliary ductal dilation. Pancreas: Normal contours without ductal dilatation. No peripancreatic fluid collection. Spleen: The spleen is enlarged measuring approximately 14.6 cm craniocaudad. Adrenals/Urinary Tract: --Adrenal glands: No adrenal hemorrhage. --Right kidney/ureter: There are multiple nonobstructing stones in the right kidney. --Left kidney/ureter: There are multiple nonobstructing stones in the left kidney. --Urinary bladder: Unremarkable. Stomach/Bowel: --Stomach/Duodenum: No hiatal hernia or  other gastric abnormality. Normal duodenal course and caliber. --Small bowel: No dilatation or inflammation. --Colon: No focal abnormality. --Appendix: Normal. Vascular/Lymphatic: Atherosclerotic calcification is present within the non-aneurysmal abdominal aorta, without hemodynamically significant stenosis. --No retroperitoneal lymphadenopathy. --No mesenteric lymphadenopathy. --No pelvic or inguinal lymphadenopathy. Reproductive: The prostate gland is enlarged. Other: No ascites or free air. There is skin thickening  involving the anterior abdominal wall bilaterally which may related to multiple subcutaneous injections this location. Musculoskeletal. No acute displaced fractures. IMPRESSION: 1. There are multiple nonobstructing stones in the bilateral kidneys. No hydronephrosis or hydroureter. 2. There is a moderate-sized pericardial effusion which has increased in size since 2015. 3. Splenomegaly. 4. Cholelithiasis without acute inflammation. 5. The intracardiac blood pool is hypodense relative to the adjacent myocardium consistent with anemia. Aortic Atherosclerosis (ICD10-I70.0). Electronically Signed   By: Constance Holster M.D.   On: 09/20/2019 19:19    Procedures Procedures (including critical care time)  Medications Ordered in ED Medications  HYDROmorphone (DILAUDID) injection 1 mg (has no administration in time range)  ondansetron (ZOFRAN) injection 4 mg (has no administration in time range)    ED Course  I have reviewed the triage vital signs and the nursing notes.  Pertinent labs & imaging results that were available during my care of the patient were reviewed by me and considered in my medical decision making (see chart for details).    MDM Rules/Calculators/A&P                      Iv ns. Dilaudid 1 mg iv. zofran iv. Labs sent. CT ordered.  Reviewed nursing notes and prior charts for additional history.   Ct reviewed/interpreted by me - bil kidney stones, but no ureteral or obstructing stone. Several incidental findings also noted including gallstones, pericard eff - discussed w pt, rec pcp f/u. Note, no cp or discomfort, no sob or new doe/fatigue, no cough or uri symptoms, no faintness or dizziness.  Recheck, pain improved w meds.   Labs reviewed/interpreted by me - hct normal. Glucose high, 340, hc03 normal. novolog sq. Fluids.   ua neg for infection.   Recheck pain improved.   Component of pts pain (left lateral lower flank) appears worse movement, turning torso, position change  - ?musculoskeletal.   Patient continues to be comfortable, no distress. abd soft nt.   Rec pcp f/u.  Return precautions provided.        Final Clinical Impression(s) / ED Diagnoses Final diagnoses:  None    Rx / DC Orders ED Discharge Orders    None       Lajean Saver, MD 09/20/19 2022

## 2019-09-20 NOTE — ED Triage Notes (Signed)
Pt with left flank pain for a week, recent antibiotics for a possible UTI.  Pt with hx of kidney stones.  LBm today.

## 2019-10-07 ENCOUNTER — Other Ambulatory Visit: Payer: Medicare HMO

## 2019-10-16 ENCOUNTER — Other Ambulatory Visit: Payer: Self-pay | Admitting: Family Medicine

## 2019-10-20 ENCOUNTER — Other Ambulatory Visit: Payer: Self-pay | Admitting: *Deleted

## 2019-10-20 ENCOUNTER — Other Ambulatory Visit: Payer: Self-pay

## 2019-10-20 MED ORDER — BD PEN NEEDLE NANO U/F 32G X 4 MM MISC: 0 refills | Status: DC

## 2019-10-20 MED ORDER — ACCU-CHEK SMARTVIEW VI STRP: ORAL_STRIP | 5 refills | Status: DC

## 2019-10-20 MED ORDER — METOPROLOL TARTRATE 25 MG PO TABS: 25.0000 mg | ORAL_TABLET | Freq: Two times a day (BID) | ORAL | 1 refills | Status: DC

## 2019-10-20 MED ORDER — TORSEMIDE 20 MG PO TABS: ORAL_TABLET | ORAL | 0 refills | Status: DC

## 2019-10-20 MED ORDER — ISOSORBIDE MONONITRATE ER 60 MG PO TB24: 60.0000 mg | ORAL_TABLET | Freq: Every day | ORAL | 2 refills | Status: DC

## 2019-10-20 MED ORDER — LOSARTAN POTASSIUM 25 MG PO TABS: ORAL_TABLET | ORAL | 2 refills | Status: DC

## 2019-10-20 NOTE — Telephone Encounter (Signed)
Really he should be doing it 3 times daily I do not see the need for 4 times daily follow-up in the spring may have 6 months on refills

## 2019-10-26 ENCOUNTER — Encounter (INDEPENDENT_AMBULATORY_CARE_PROVIDER_SITE_OTHER): Payer: Medicare HMO | Admitting: Ophthalmology

## 2019-10-26 ENCOUNTER — Other Ambulatory Visit: Payer: Self-pay

## 2019-10-26 DIAGNOSIS — H35033 Hypertensive retinopathy, bilateral: Secondary | ICD-10-CM | POA: Diagnosis not present

## 2019-10-26 DIAGNOSIS — E113592 Type 2 diabetes mellitus with proliferative diabetic retinopathy without macular edema, left eye: Secondary | ICD-10-CM

## 2019-10-26 DIAGNOSIS — E113391 Type 2 diabetes mellitus with moderate nonproliferative diabetic retinopathy without macular edema, right eye: Secondary | ICD-10-CM

## 2019-10-26 DIAGNOSIS — I10 Essential (primary) hypertension: Secondary | ICD-10-CM | POA: Diagnosis not present

## 2019-10-26 DIAGNOSIS — E11319 Type 2 diabetes mellitus with unspecified diabetic retinopathy without macular edema: Secondary | ICD-10-CM | POA: Diagnosis not present

## 2019-10-26 DIAGNOSIS — H43813 Vitreous degeneration, bilateral: Secondary | ICD-10-CM | POA: Diagnosis not present

## 2019-10-26 DIAGNOSIS — D3132 Benign neoplasm of left choroid: Secondary | ICD-10-CM

## 2019-10-26 LAB — HM DIABETES EYE EXAM

## 2019-10-28 ENCOUNTER — Ambulatory Visit (INDEPENDENT_AMBULATORY_CARE_PROVIDER_SITE_OTHER): Payer: Medicare HMO | Admitting: Urology

## 2019-10-28 ENCOUNTER — Other Ambulatory Visit: Payer: Self-pay

## 2019-10-28 ENCOUNTER — Encounter: Payer: Self-pay | Admitting: Urology

## 2019-10-28 VITALS — BP 148/76 | HR 87 | Temp 97.7°F | Ht 73.0 in | Wt 255.0 lb

## 2019-10-28 DIAGNOSIS — M545 Low back pain, unspecified: Secondary | ICD-10-CM

## 2019-10-28 DIAGNOSIS — N200 Calculus of kidney: Secondary | ICD-10-CM

## 2019-10-28 LAB — POCT URINALYSIS DIPSTICK
Glucose, UA: NEGATIVE
Ketones, UA: NEGATIVE
Leukocytes, UA: NEGATIVE
Nitrite, UA: NEGATIVE
Protein, UA: POSITIVE — AB
Spec Grav, UA: 1.025 (ref 1.010–1.025)
Urobilinogen, UA: NEGATIVE E.U./dL — AB
pH, UA: 5 (ref 5.0–8.0)

## 2019-10-28 MED ORDER — KETOROLAC TROMETHAMINE 30 MG/ML IJ SOLN
60.0000 mg | Freq: Once | INTRAMUSCULAR | Status: AC
  Administered 2019-10-28: 60 mg via INTRAMUSCULAR

## 2019-10-28 MED ORDER — KETOROLAC TROMETHAMINE 60 MG/2ML IM SOLN: 60.0000 mg | Freq: Once | INTRAMUSCULAR | Status: DC

## 2019-10-28 MED ORDER — CYCLOBENZAPRINE HCL 5 MG PO TABS: 5.0000 mg | ORAL_TABLET | Freq: Three times a day (TID) | ORAL | 0 refills | Status: DC | PRN

## 2019-10-28 MED ORDER — IBUPROFEN 600 MG PO TABS: 600.0000 mg | ORAL_TABLET | Freq: Three times a day (TID) | ORAL | 0 refills | Status: DC | PRN

## 2019-10-28 NOTE — Progress Notes (Signed)
10/28/2019 9:24 AM   Gianlucas Evenson Budai 1956/08/22 [id number]  Referring provider: [name], MD [address] [place],  [place] [address]   Left flank pain  HPI: Mr Vitolo is a 65yo with a hx of nephrolithiasis here for evaluation for nephrolithiasis. He developed left flank pain 3 weeks ago and presented to [facility] ER and was diagnosed with bilateral small renal calculi. No ureteral calculi. His pain is sharp, intermittent, moderate to severe and radiating to left testicle. Pain is exacerbated by movement and especially standing from seated position. He was given narcotics in the ER which failed to improve his pain   PMH: Past Medical History:  Diagnosis Date  . Anemia   . Anxiety   . Anxiety   . Arteriosclerotic cardiovascular disease (ASCVD)    a. 05/2011 s/p DES to PDA and RCA. b. 08/2012 Inflat  STEMI/Cath/PCI: LM minor irregs, LAD 50p, D1 50, LCX nl, OM1 25, RCA 30-40p, 100d (treated with 2.75x73mm Promus Premier DES);    [name] Asthma   . Bell palsy   . C. difficile colitis    a. 08/2012  . Cholelithiasis 07/2012   Asymptomatic; identified incidentally  . Chronic diastolic CHF (congestive heart failure) ([place])   . CKD (chronic kidney disease), stage III   . Contrast dye induced nephropathy    a. 08/2012 post cath/pci  . COPD (chronic obstructive pulmonary disease) ([place])   . Diabetes mellitus    Peripheral neuropathy  . Gallstones   . GERD (gastroesophageal reflux disease)   . Gout   . Hyperlipidemia   . Hypertension   . Myocardial infarct ([place]) 09/08/12  . Nephrolithiasis   . Old myocardial infarction   . Pericardial effusion    a. s/p window in 2014. b. s/p redo window in 09/2018.  [name] Peripheral neuropathy   . PONV (postoperative nausea and vomiting)   . Sleep apnea   . Statin intolerance     Surgical History: Past Surgical History:  Procedure Laterality Date  . CARDIAC CATHETERIZATION    . CHEST TUBE INSERTION    . CIRCUMCISION    . COLONOSCOPY      In [facility], approximately 2011 per patient, was normal. Advised to come back in 10 years.  . ESOPHAGOGASTRODUODENOSCOPY     in [place] over 20 yrs ago  . ESOPHAGOGASTRODUODENOSCOPY  06/12/2012   CBJ:SEGBTD esophagus-status post Venia Minks dilation. Abnormal gastric mucosa of uncertain significance-status post biopsy  . EYE SURGERY    . INTRAOPERATIVE TRANSESOPHAGEAL ECHOCARDIOGRAM N/A 06/23/2013   Procedure: INTRAOPERATIVE TRANSESOPHAGEAL ECHOCARDIOGRAM;  Surgeon: Grace Isaac, MD;  Location: [facility];  Service: Open Heart Surgery;  Laterality: N/A;  . LEFT HEART CATHETERIZATION WITH CORONARY ANGIOGRAM N/A 09/08/2012   Procedure: LEFT HEART CATHETERIZATION WITH CORONARY ANGIOGRAM;  Surgeon: [name], MD;  Location: [facility] CATH LAB;  Service: Cardiovascular;  Laterality: N/A;  . stents    . SUBXYPHOID PERICARDIAL WINDOW N/A 06/23/2013   Procedure: SUBXYPHOID PERICARDIAL WINDOW;  Surgeon: Grace Isaac, MD;  Location: [facility];  Service: Thoracic;  Laterality: N/A;  . SUBXYPHOID PERICARDIAL WINDOW  10/01/2018  . SUBXYPHOID PERICARDIAL WINDOW N/A 10/01/2018   Procedure: REDO SUBXYPHOID PERICARDIAL WINDOW;  Surgeon: Grace Isaac, MD;  Location: [facility];  Service: Thoracic;  Laterality: N/A;  . TEE WITHOUT CARDIOVERSION N/A 10/01/2018   Procedure: TRANSESOPHAGEAL ECHOCARDIOGRAM (TEE);  Surgeon: Grace Isaac, MD;  [facility];  Service: Thoracic;  Laterality: N/A;    Home Medications:  Allergies  as of 10/28/2019      Reactions   Hydrocodone Nausea And Vomiting   Lisinopril Cough   Neurontin [gabapentin] Other (See Comments)   Reaction:  Suicidal thoughts    Statins Other (See Comments)   Reaction:  Muscle pain    Metformin And Related Diarrhea   Norvasc [amlodipine Besylate] Swelling, Other (See Comments)   Reaction:  Pedal edema      Medication List       Accurate as of October 28, 2019  9:24 AM. If you have any questions, ask your nurse or doctor.          Accu-Chek SmartView test strip Generic drug: glucose blood TEST BLOOD SUGAR THREE TIMES DAILY   acetaminophen 500 MG tablet Commonly known as: TYLENOL Take 1,500 mg by mouth every 6 (six) hours as needed for mild pain, fever or headache.   allopurinol 100 MG tablet Commonly known as: ZYLOPRIM Take one tablet by mouth daily   ALPRAZolam 0.5 MG tablet Commonly known as: XANAX 1/2 to 1 qhs prn insomnia What changed:   how much to take  how to take this  when to take this  reasons to take this  additional instructions   aspirin 81 MG chewable tablet Chew 81 mg by mouth at bedtime.   BD Pen Needle Nano U/F 32G X 4 MM Misc Generic drug: Insulin Pen Needle Use once daily as directed   colchicine 0.6 MG tablet 1 twice daily as needed for gout flareup   glimepiride 1 MG tablet Commonly known as: AMARYL Take one tablet po with meals. Max 3/ day   isosorbide mononitrate 60 MG 24 hr tablet Commonly known as: IMDUR Take 1 tablet (60 mg total) by mouth daily.   Levemir FlexTouch 100 UNIT/ML Pen Generic drug: Insulin Detemir INJECT 80 UNITS INTO THE SKIN DAILY AT 10 PM. What changed: See the new instructions.   losartan 25 MG tablet Commonly known as: COZAAR TAKE 1 TABLET (25 MG TOTAL) BY MOUTH DAILY.   methocarbamol 750 MG tablet Commonly known as: ROBAXIN Take 1 tablet (750 mg total) by mouth 3 (three) times daily as needed (muscle spasm/pain).   metoprolol tartrate 25 MG tablet Commonly known as: LOPRESSOR Take 1 tablet (25 mg total) by mouth 2 (two) times daily.   NONFORMULARY OR COMPOUNDED ITEM Antifungal solution: Terbinafine 3%, Fluconazole 2%, Tea Tree Oil 5%, Urea 10%, Ibuprofen 2% in DMSO suspension #82mL   ofloxacin 0.3 % OTIC solution Commonly known as: FLOXIN PLACE 5 DROPS INTO THE LEFT EAR 2 (TWO) TIMES DAILY.   spironolactone 25 MG tablet Commonly known as: ALDACTONE Take 0.5 tablets (12.5 mg total) by mouth daily.   torsemide 20 MG  tablet Commonly known as: DEMADEX TAKE 1 TO 2 TABLETS EACH MORNING AS DIRECTED BY PHYSICIAN   traMADol 50 MG tablet Commonly known as: ULTRAM TAKE 1 TABLET (50 MG TOTAL) BY MOUTH EVERY 6 (SIX) HOURS AS NEEDED   VITAMIN D3 PO Take 1 capsule by mouth every other day.       Allergies:  Allergies  Allergen Reactions  . Hydrocodone Nausea And Vomiting  . Lisinopril Cough  . Neurontin [Gabapentin] Other (See Comments)    Reaction:  Suicidal thoughts   . Statins Other (See Comments)    Reaction:  Muscle pain   . Metformin And Related Diarrhea  . Norvasc [Amlodipine Besylate] Swelling and Other (See Comments)    Reaction:  Pedal edema    Family History: Family History  Problem  Relation Age of Onset  . Diabetes Mother   . Heart attack Mother   . Stroke Mother   . Diabetes Sister   . Sleep apnea Sister   . Hypertension Brother   . Diabetes Brother   . Diabetes Brother   . Hypertension Brother   . Colon cancer Neg Hx   . Liver disease Neg Hx     Social History:  reports that he has never smoked. He has never used smokeless tobacco. He reports that he does not drink alcohol or use drugs.  ROS: All other review of systems were reviewed and are negative except what is noted above in HPI  Physical Exam: BP (!) 148/76   Pulse 87   Temp 97.7 F (36.5 C)   Ht 6\' 1"  (1.854 m)   Wt 255 lb (115.7 kg)   BMI 33.64 kg/m   Constitutional:  Alert and oriented, No acute distress. HEENT: [place] AT, moist mucus membranes.  Trachea midline, no masses. Cardiovascular: No clubbing, cyanosis, or edema. Respiratory: Normal respiratory effort, no increased work of breathing. GI: Abdomen is soft, nontender, nondistended, no abdominal masses GU: No CVA tenderness Lymph: No cervical or inguinal lymphadenopathy. Skin: No rashes, bruises or suspicious lesions. Neurologic: Grossly intact, no focal deficits, moving all 4 extremities. Psychiatric: Normal mood and affect.  Laboratory Data: Lab  Results  Component Value Date   WBC 4.9 09/20/2019   HGB 12.6 (L) 09/20/2019   HCT 39.3 09/20/2019   MCV 90.3 09/20/2019   PLT 212 09/20/2019    Lab Results  Component Value Date   CREATININE 1.62 (H) 09/20/2019    Lab Results  Component Value Date   PSA 0.65 05/12/2014   PSA 0.57 05/11/2013    No results found for: TESTOSTERONE  Lab Results  Component Value Date   HGBA1C 8.3 (H) 08/06/2019    Urinalysis    Component Value Date/Time   COLORURINE STRAW (A) 09/20/2019 2000   APPEARANCEUR CLEAR 09/20/2019 2000   LABSPEC 1.007 09/20/2019 2000   PHURINE 7.0 09/20/2019 2000   GLUCOSEU >=500 (A) 09/20/2019 2000   HGBUR NEGATIVE 09/20/2019 2000   BILIRUBINUR small 10/28/2019 0903   KETONESUR NEGATIVE 09/20/2019 2000   PROTEINUR Positive (A) 10/28/2019 0903   PROTEINUR 100 (A) 09/20/2019 2000   UROBILINOGEN negative (A) 10/28/2019 0903   UROBILINOGEN 0.2 01/21/2015 1053   NITRITE neg 10/28/2019 0903   NITRITE NEGATIVE 09/20/2019 2000   LEUKOCYTESUR Negative 10/28/2019 0903   LEUKOCYTESUR NEGATIVE 09/20/2019 2000    Lab Results  Component Value Date   LABMICR 1,319.1 10/16/2016   BACTERIA NONE SEEN 09/20/2019    Pertinent Imaging: CT stone study 1/24. Images reviewed and discussed with the patient Results for orders placed during the hospital encounter of 10/26/14  X-ray abdomen [facility]   Narrative CLINICAL DATA:  Constipation and  left-sided abdominal pain  EXAM: ABDOMEN - 1 VIEW  COMPARISON:  None.  FINDINGS: The bowel gas pattern is normal. Moderate stool burden identified within the colon. No radio-opaque calculi or other significant radiographic abnormality are seen.  IMPRESSION: 1. Nonobstructive bowel gas pattern. 2. Moderate stool burden in the colon   Electronically Signed   By: Kerby Moors M.D.   On: 10/26/2014 18:13    Results for orders placed during the hospital encounter of 08/22/12  US Venous Img Lower Bilateral   Narrative  *RADIOLOGY REPORT*  Clinical Data: Lower extremity edema  VENOUS DUPLEX ULTRASOUND OF BILATERAL LOWER EXTREMITIES  Technique:  Gray-scale sonography with  graded compression, as well as color Doppler and duplex ultrasound, were performed to evaluate the deep venous system of both lower extremities from the level of the common femoral vein through the popliteal and proximal calf veins.  Spectral Doppler was utilized to evaluate flow at rest and with distal augmentation maneuvers.  Comparison:  None.  Findings: The visualized bilateral lower extremity deep venous systems appear patent.  Normal compressibility.  Patent color Doppler flow.  Satisfactory spectral Doppler with respiratory variation and response to augmentation.  The greater saphenous vein, where visualized, is patent and compressible bilaterally.  7 mm short-axis right groin node with preservation of the normal fatty hilum, likely reactive.  IMPRESSION: No deep venous thrombosis in the visualized bilateral lower extremities.   Original Report Authenticated By: Julian Hy, M.D.    No results found for this or any previous visit. No results found for this or any previous visit. Results for orders placed during the hospital encounter of 01/02/17  US Renal   Narrative CLINICAL DATA:  Chronic renal disease, stage III  EXAM: RENAL / URINARY TRACT ULTRASOUND COMPLETE  COMPARISON:  CT abdomen and pelvis October 17, 2013  FINDINGS: Right Kidney:  Length: 12.1 cm. Echogenicity and renal cortical thickness are within normal limits. No mass, perinephric fluid, or hydronephrosis visualized. No sonographically demonstrable calculus or ureterectasis.  Left Kidney:  Length: 13.0 cm. Echogenicity and renal cortical thickness are within normal limits. No mass, perinephric fluid, or hydronephrosis visualized. No sonographically demonstrable calculus or ureterectasis.  Bladder:  Appears normal for degree  of bladder distention.  IMPRESSION: Study within normal limits.   Electronically Signed   By: Lowella Grip III M.D.   On: 01/02/2017 11:01    No results found for this or any previous visit. No results found for this or any previous visit. Results for orders placed during the hospital encounter of 09/20/19  CT Renal Stone Study   Narrative CLINICAL DATA:  Left flank pain.  EXAM: CT ABDOMEN AND PELVIS WITHOUT CONTRAST  TECHNIQUE: Multidetector CT imaging of the abdomen and pelvis was performed following the standard protocol without IV contrast.  COMPARISON:  10/17/2013  FINDINGS: Lower chest: There are linear airspace opacities at the lung bases bilaterally, right worse than left. The heart size is borderline enlarged. The intracardiac blood pool is hypodense relative to the adjacent myocardium consistent with anemia.There is a moderate-sized partially visualized pericardial effusion. This has increased in size since 2015.  Hepatobiliary: The liver is normal. Cholelithiasis without acute inflammation.There is no biliary ductal dilation.  Pancreas: Normal contours without ductal dilatation. No peripancreatic fluid collection.  Spleen: The spleen is enlarged measuring approximately 14.6 cm craniocaudad.  Adrenals/Urinary Tract:  --Adrenal glands: No adrenal hemorrhage.  --Right kidney/ureter: There are multiple nonobstructing stones in the right kidney.  --Left kidney/ureter: There are multiple nonobstructing stones in the left kidney.  --Urinary bladder: Unremarkable.  Stomach/Bowel:  --Stomach/Duodenum: No hiatal hernia or other gastric abnormality. Normal duodenal course and caliber.  --Small bowel: No dilatation or inflammation.  --Colon: No focal abnormality.  --Appendix: Normal.  Vascular/Lymphatic: Atherosclerotic calcification is present within the non-aneurysmal abdominal aorta, without hemodynamically significant stenosis.  --No  retroperitoneal lymphadenopathy.  --No mesenteric lymphadenopathy.  --No pelvic or inguinal lymphadenopathy.  Reproductive: The prostate gland is enlarged.  Other: No ascites or free air. There is skin thickening involving the anterior abdominal wall bilaterally which may related to multiple subcutaneous injections this location.  Musculoskeletal. No acute displaced fractures.  IMPRESSION: 1. There are multiple  nonobstructing stones in the bilateral kidneys. No hydronephrosis or hydroureter. 2. There is a moderate-sized pericardial effusion which has increased in size since 2015. 3. Splenomegaly. 4. Cholelithiasis without acute inflammation. 5. The intracardiac blood pool is hypodense relative to the adjacent myocardium consistent with anemia.  Aortic Atherosclerosis (ICD10-I70.0).   Electronically Signed   By: Constance Holster M.D.   On: 09/20/2019 19:19     Assessment & Plan:    1. Kidney stones -We discussed the management including observation, ESWL and URS and we have elected to proceed with observation  2. Dorsalgia -toradol 60mg  IM in clinic -motrin 600mg  TID for 5 days -flexeril TID prn - POCT urinalysis dipstick   No follow-ups on file.  Nicolette Bang, MD  [facility] [place]

## 2019-10-28 NOTE — Progress Notes (Signed)
Urological Symptom Review  Patient is experiencing the following symptoms: Frequent urination Get up at night to urinate Stream starts and stops Erection problems (male only)   Review of Systems  Gastrointestinal (upper)  : Negative for upper GI symptoms  Gastrointestinal (lower) : Constipation  Constitutional : Negative for symptoms  Skin: Negative for skin symptoms  Eyes: Negative for eye symptoms  Ear/Nose/Throat : Negative for Ear/Nose/Throat symptoms  Hematologic/Lymphatic: Negative for Hematologic/Lymphatic symptoms  Cardiovascular : Leg swelling  Respiratory : Cough Shortness of breath  Endocrine: Excessive thirst  Musculoskeletal: Back pain Joint pain  Neurological: Negative for neurological symptoms  Psychologic: Anxiety

## 2019-10-28 NOTE — Patient Instructions (Signed)

## 2019-10-29 ENCOUNTER — Other Ambulatory Visit: Payer: Self-pay | Admitting: *Deleted

## 2019-10-29 ENCOUNTER — Ambulatory Visit (INDEPENDENT_AMBULATORY_CARE_PROVIDER_SITE_OTHER): Payer: Medicare HMO

## 2019-10-29 DIAGNOSIS — I313 Pericardial effusion (noninflammatory): Secondary | ICD-10-CM | POA: Diagnosis not present

## 2019-10-29 DIAGNOSIS — I3139 Other pericardial effusion (noninflammatory): Secondary | ICD-10-CM

## 2019-10-29 MED ORDER — ACCU-CHEK SMARTVIEW VI STRP: ORAL_STRIP | 5 refills | Status: DC

## 2019-11-02 ENCOUNTER — Telehealth: Payer: Self-pay | Admitting: *Deleted

## 2019-11-02 ENCOUNTER — Other Ambulatory Visit: Payer: Self-pay | Admitting: Family Medicine

## 2019-11-02 DIAGNOSIS — S134XXA Sprain of ligaments of cervical spine, initial encounter: Secondary | ICD-10-CM | POA: Diagnosis not present

## 2019-11-02 DIAGNOSIS — S335XXA Sprain of ligaments of lumbar spine, initial encounter: Secondary | ICD-10-CM | POA: Diagnosis not present

## 2019-11-02 DIAGNOSIS — S233XXA Sprain of ligaments of thoracic spine, initial encounter: Secondary | ICD-10-CM | POA: Diagnosis not present

## 2019-11-02 DIAGNOSIS — I313 Pericardial effusion (noninflammatory): Secondary | ICD-10-CM

## 2019-11-02 DIAGNOSIS — M9902 Segmental and somatic dysfunction of thoracic region: Secondary | ICD-10-CM | POA: Diagnosis not present

## 2019-11-02 DIAGNOSIS — I3139 Other pericardial effusion (noninflammatory): Secondary | ICD-10-CM

## 2019-11-02 DIAGNOSIS — M9901 Segmental and somatic dysfunction of cervical region: Secondary | ICD-10-CM | POA: Diagnosis not present

## 2019-11-02 DIAGNOSIS — M9903 Segmental and somatic dysfunction of lumbar region: Secondary | ICD-10-CM | POA: Diagnosis not present

## 2019-11-02 NOTE — Telephone Encounter (Signed)
-----   Message from Herminio Commons, MD sent at 10/29/2019  4:11 PM EST ----- Pumping function is mildly reduced but overall stable.  Moderate size fluid collection around the heart is stable.  It is not compromising cardiac function.  Repeat a limited echocardiogram to reassess effusion in 6 months.

## 2019-11-02 NOTE — Telephone Encounter (Signed)
Eugene [name], [place]  3[birth year]0762 26:33 AM EST    Patient notified. Copy to pmd.

## 2019-11-02 NOTE — Telephone Encounter (Signed)
Med check 05/12/19

## 2019-11-04 ENCOUNTER — Encounter: Payer: Self-pay | Admitting: *Deleted

## 2019-11-04 ENCOUNTER — Other Ambulatory Visit: Payer: Self-pay | Admitting: *Deleted

## 2019-11-06 ENCOUNTER — Ambulatory Visit: Payer: Medicare HMO | Admitting: Podiatry

## 2019-11-06 ENCOUNTER — Other Ambulatory Visit: Payer: Self-pay

## 2019-11-06 DIAGNOSIS — L600 Ingrowing nail: Secondary | ICD-10-CM

## 2019-11-06 DIAGNOSIS — E1142 Type 2 diabetes mellitus with diabetic polyneuropathy: Secondary | ICD-10-CM

## 2019-11-06 DIAGNOSIS — B351 Tinea unguium: Secondary | ICD-10-CM

## 2019-11-06 NOTE — Patient Instructions (Addendum)
EPSOM SALT FOOT SOAK INSTRUCTIONS  Shopping List:  A. Plain epsom salt (not scented) B. Neosporin Cream/Ointment or Bacitracin Cream/Ointment C. 1-inch fabric band-aids  1.  Place 1/4 cup of epsom salts in 2 quarts of warm tap water. IF YOU ARE DIABETIC, OR HAVE NEUROPATHY, CHECK THE TEMPERATURE OF THE WATER WITH YOUR ELBOW.  2.  Submerge your foot/feet in the solution and soak for 10-15 minutes.      3.  Next, remove your foot or feet from solution, blot dry the affected area.    4.  Apply antibiotic ointment and cover with fabric band-aid .  5.  This soak should be done once a day for 7 days.   6.  Monitor for any signs/symptoms of infection such as redness, swelling, odor, drainage, increased pain, or non-healing of digit.   7.  Please do not hesitate to call the office and speak to a Nurse or Doctor if you have questions.   8.  If you experience fever, chills, nightsweats, nausea or vomiting with worsening of digit, please go to the emergency room.    Diabetes Mellitus and Foot Care Foot care is an important part of your health, especially when you have diabetes. Diabetes may cause you to have problems because of poor blood flow (circulation) to your feet and legs, which can cause your skin to:  Become thinner and drier.  Break more easily.  Heal more slowly.  Peel and crack. You may also have nerve damage (neuropathy) in your legs and feet, causing decreased feeling in them. This means that you may not notice minor injuries to your feet that could lead to more serious problems. Noticing and addressing any potential problems early is the best way to prevent future foot problems. How to care for your feet Foot hygiene  Wash your feet daily with warm water and mild soap. Do not use hot water. Then, pat your feet and the areas between your toes until they are completely dry. Do not soak your feet as this can dry your skin.  Trim your toenails straight across. Do not dig under  them or around the cuticle. File the edges of your nails with an emery board or nail file.  Apply a moisturizing lotion or petroleum jelly to the skin on your feet and to dry, brittle toenails. Use lotion that does not contain alcohol and is unscented. Do not apply lotion between your toes. Shoes and socks  Wear clean socks or stockings every day. Make sure they are not too tight. Do not wear knee-high stockings since they may decrease blood flow to your legs.  Wear shoes that fit properly and have enough cushioning. Always look in your shoes before you put them on to be sure there are no objects inside.  To break in new shoes, wear them for just a few hours a day. This prevents injuries on your feet. Wounds, scrapes, corns, and calluses  Check your feet daily for blisters, cuts, bruises, sores, and redness. If you cannot see the bottom of your feet, use a mirror or ask someone for help.  Do not cut corns or calluses or try to remove them with medicine.  If you find a minor scrape, cut, or break in the skin on your feet, keep it and the skin around it clean and dry. You may clean these areas with mild soap and water. Do not clean the area with peroxide, alcohol, or iodine.  If you have a wound, scrape, corn,  or callus on your foot, look at it several times a day to make sure it is healing and not infected. Check for: ? Redness, swelling, or pain. ? Fluid or blood. ? Warmth. ? Pus or a bad smell. General instructions  Do not cross your legs. This may decrease blood flow to your feet.  Do not use heating pads or hot water bottles on your feet. They may burn your skin. If you have lost feeling in your feet or legs, you may not know this is happening until it is too late.  Protect your feet from hot and cold by wearing shoes, such as at the beach or on hot pavement.  Schedule a complete foot exam at least once a year (annually) or more often if you have foot problems. If you have foot  problems, report any cuts, sores, or bruises to your health care provider immediately. Contact a health care provider if:  You have a medical condition that increases your risk of infection and you have any cuts, sores, or bruises on your feet.  You have an injury that is not healing.  You have redness on your legs or feet.  You feel burning or tingling in your legs or feet.  You have pain or cramps in your legs and feet.  Your legs or feet are numb.  Your feet always feel cold.  You have pain around a toenail. Get help right away if:  You have a wound, scrape, corn, or callus on your foot and: ? You have pain, swelling, or redness that gets worse. ? You have fluid or blood coming from the wound, scrape, corn, or callus. ? Your wound, scrape, corn, or callus feels warm to the touch. ? You have pus or a bad smell coming from the wound, scrape, corn, or callus. ? You have a fever. ? You have a red line going up your leg. Summary  Check your feet every day for cuts, sores, red spots, swelling, and blisters.  Moisturize feet and legs daily.  Wear shoes that fit properly and have enough cushioning.  If you have foot problems, report any cuts, sores, or bruises to your health care provider immediately.  Schedule a complete foot exam at least once a year (annually) or more often if you have foot problems. This information is not intended to replace advice given to you by your health care provider. Make sure you discuss any questions you have with your health care provider. Document Revised: 05/06/2019 Document Reviewed: 09/14/2016 Elsevier Patient Education  [place].

## 2019-11-12 ENCOUNTER — Encounter: Payer: Self-pay | Admitting: Podiatry

## 2019-11-12 ENCOUNTER — Other Ambulatory Visit: Payer: Self-pay | Admitting: Family Medicine

## 2019-11-12 NOTE — Progress Notes (Signed)
Subjective: Eugene [name] presents today for follow up of preventative diabetic foot care and painful mycotic nails b/l that are difficult to trim. Pain interferes with ambulation. Aggravating factors include wearing enclosed shoe gear. Pain is relieved with periodic professional debridement.   Allergies  Allergen Reactions  . Hydrocodone Nausea And Vomiting  . Lisinopril Cough  . Neurontin [Gabapentin] Other (See Comments)    Reaction:  Suicidal thoughts   . Statins Other (See Comments)    Reaction:  Muscle pain   . Metformin And Related Diarrhea  . Norvasc [Amlodipine Besylate] Swelling and Other (See Comments)    Reaction:  Pedal edema     Objective: There were no vitals filed for this visit.  Pt 64 y.o. year old male  in NAD. AAO x 3.   Vascular Examination:  Capillary refill time to digits immediate b/l. Palpable DP pulses b/l. Palpable PT pulses b/l. Pedal hair sparse b/l. Skin temperature gradient within normal limits b/l.  Dermatological Examination: Pedal skin with normal turgor, texture and tone bilaterally. No open wounds bilaterally. No interdigital macerations bilaterally. Toenails 1-5 b/l elongated, dystrophic, thickened, crumbly with subungual debris and tenderness to dorsal palpation.   Incurvated nailplate left great toe medial border(s) with nail border hypertrophy. No erythema, no edema, no drainage noted.  Musculoskeletal: Normal muscle strength 5/5 to all lower extremity muscle groups bilaterally, no gross bony deformities bilaterally and no pain crepitus or joint limitation noted with ROM b/l  Neurological: Protective sensation diminished with 10g monofilament b/l.  Assessment: 1. Onychomycosis   2. Ingrown toenail without infection   3. Diabetic polyneuropathy associated with type 2 diabetes mellitus (HCC)    Plan: -Toenails 1-5 b/l were debrided in length and girth with sterile nail nippers and dremel without iatrogenic bleeding.  -Offending  nail border debrided and curretaged left hallux. Border cleansed with alcohol and triple antibiotic applied. Prescribed epsom salts soaks for left foot. Perform once daily for 7 days. -Patient to continue soft, supportive shoe gear daily. Start procedure for diabetic shoes.  -Patient to report any pedal injuries to medical professional immediately. -Patient/POA to call should there be question/concern in the interim.  Return in about 10 weeks (around 01/15/2020) for diabetic nail trim.

## 2019-12-02 ENCOUNTER — Ambulatory Visit: Payer: Medicare HMO | Admitting: Urology

## 2019-12-04 DIAGNOSIS — M9901 Segmental and somatic dysfunction of cervical region: Secondary | ICD-10-CM | POA: Diagnosis not present

## 2019-12-04 DIAGNOSIS — M9903 Segmental and somatic dysfunction of lumbar region: Secondary | ICD-10-CM | POA: Diagnosis not present

## 2019-12-04 DIAGNOSIS — M9902 Segmental and somatic dysfunction of thoracic region: Secondary | ICD-10-CM | POA: Diagnosis not present

## 2019-12-04 DIAGNOSIS — S335XXA Sprain of ligaments of lumbar spine, initial encounter: Secondary | ICD-10-CM | POA: Diagnosis not present

## 2019-12-04 DIAGNOSIS — S233XXA Sprain of ligaments of thoracic spine, initial encounter: Secondary | ICD-10-CM | POA: Diagnosis not present

## 2019-12-04 DIAGNOSIS — S134XXA Sprain of ligaments of cervical spine, initial encounter: Secondary | ICD-10-CM | POA: Diagnosis not present

## 2019-12-07 DIAGNOSIS — I10 Essential (primary) hypertension: Secondary | ICD-10-CM | POA: Diagnosis not present

## 2019-12-07 DIAGNOSIS — Z794 Long term (current) use of insulin: Secondary | ICD-10-CM | POA: Diagnosis not present

## 2019-12-07 DIAGNOSIS — N1831 Chronic kidney disease, stage 3a: Secondary | ICD-10-CM | POA: Diagnosis not present

## 2019-12-07 DIAGNOSIS — S134XXA Sprain of ligaments of cervical spine, initial encounter: Secondary | ICD-10-CM | POA: Diagnosis not present

## 2019-12-07 DIAGNOSIS — S233XXA Sprain of ligaments of thoracic spine, initial encounter: Secondary | ICD-10-CM | POA: Diagnosis not present

## 2019-12-07 DIAGNOSIS — M9901 Segmental and somatic dysfunction of cervical region: Secondary | ICD-10-CM | POA: Diagnosis not present

## 2019-12-07 DIAGNOSIS — E1121 Type 2 diabetes mellitus with diabetic nephropathy: Secondary | ICD-10-CM | POA: Diagnosis not present

## 2019-12-07 DIAGNOSIS — M9902 Segmental and somatic dysfunction of thoracic region: Secondary | ICD-10-CM | POA: Diagnosis not present

## 2019-12-07 DIAGNOSIS — S335XXA Sprain of ligaments of lumbar spine, initial encounter: Secondary | ICD-10-CM | POA: Diagnosis not present

## 2019-12-07 DIAGNOSIS — Z125 Encounter for screening for malignant neoplasm of prostate: Secondary | ICD-10-CM | POA: Diagnosis not present

## 2019-12-07 DIAGNOSIS — M9903 Segmental and somatic dysfunction of lumbar region: Secondary | ICD-10-CM | POA: Diagnosis not present

## 2019-12-07 DIAGNOSIS — E1149 Type 2 diabetes mellitus with other diabetic neurological complication: Secondary | ICD-10-CM | POA: Diagnosis not present

## 2019-12-08 LAB — MICROALBUMIN / CREATININE URINE RATIO
Creatinine, Urine: 30 mg/dL
Microalb/Creat Ratio: 1917 mg/g creat — ABNORMAL HIGH (ref 0–29)
Microalbumin, Urine: 575.2 ug/mL

## 2019-12-08 LAB — PSA: Prostate Specific Ag, Serum: 1.2 ng/mL (ref 0.0–4.0)

## 2019-12-08 LAB — HEMOGLOBIN A1C
Est. average glucose Bld gHb Est-mCnc: 180 mg/dL
Hgb A1c MFr Bld: 7.9 % — ABNORMAL HIGH (ref 4.8–5.6)

## 2019-12-14 ENCOUNTER — Other Ambulatory Visit: Payer: Self-pay

## 2019-12-14 ENCOUNTER — Telehealth (INDEPENDENT_AMBULATORY_CARE_PROVIDER_SITE_OTHER): Payer: Medicare HMO | Admitting: Family Medicine

## 2019-12-14 ENCOUNTER — Telehealth: Payer: Self-pay | Admitting: *Deleted

## 2019-12-14 DIAGNOSIS — E7849 Other hyperlipidemia: Secondary | ICD-10-CM | POA: Diagnosis not present

## 2019-12-14 DIAGNOSIS — E118 Type 2 diabetes mellitus with unspecified complications: Secondary | ICD-10-CM | POA: Diagnosis not present

## 2019-12-14 DIAGNOSIS — E1165 Type 2 diabetes mellitus with hyperglycemia: Secondary | ICD-10-CM

## 2019-12-14 DIAGNOSIS — Z79899 Other long term (current) drug therapy: Secondary | ICD-10-CM

## 2019-12-14 DIAGNOSIS — IMO0002 Reserved for concepts with insufficient information to code with codable children: Secondary | ICD-10-CM

## 2019-12-14 NOTE — Progress Notes (Signed)
   Subjective:    Patient ID: Eugene [name], male    DOB: 27-Aug-1956, 64 y.o.   MRN: [id number]  Diabetes He presents for his initial diabetic visit. He has type 2 diabetes mellitus. Risk factors for coronary artery disease include dyslipidemia, diabetes mellitus and hypertension. Current diabetic treatment includes insulin injections. His weight is stable. He is following a diabetic diet.   Patient would like to discuss results of recent labs Results for orders placed or performed in visit on 10/28/19  POCT urinalysis dipstick  Result Value Ref Range   Color, UA yellow    Clarity, UA clear    Glucose, UA Negative Negative   Bilirubin, UA small    Ketones, UA neg    Spec Grav, UA 1.025 1.010 - 1.025   Blood, UA large    pH, UA 5.0 5.0 - 8.0   Protein, UA Positive (A) Negative   Urobilinogen, UA negative (A) 0.2 or 1.0 E.U./dL   Nitrite, UA neg    Leukocytes, UA Negative Negative   Appearance clear    Odor     *Note: Due to a large number of results and/or encounters for the requested time period, some results have not been displayed. A complete set of results can be found in Results Review.  Patient's PSA 1.2, A1c 7.9  Virtual Visit via Video Note  I connected with Eugene [name] on 12/14/19 at 11:00 AM EDT by a video enabled telemedicine application and verified that I am speaking with the correct person using two identifiers.  Location: Patient: home Provider: office   I discussed the limitations of evaluation and management by telemedicine and the availability of in person appointments. The patient expressed understanding and agreed to proceed.  History of Present Illness:    Observations/Objective:   Assessment and Plan:   Follow Up Instructions:    I discussed the assessment and treatment plan with the patient. The patient was provided an opportunity to ask questions and all were answered. The patient agreed with the plan and demonstrated an  understanding of the instructions.   The patient was advised to call back or seek an in-person evaluation if the symptoms worsen or if the condition fails to improve as anticipated.  I provided 22 minutes of non-face-to-face time during this encounter.      Review of Systems     Objective:   Physical Exam   Today's visit was via telephone Physical exam was not possible for this visit      Assessment & Plan:  1. Diabetes mellitus type 2, uncontrolled, with complications [facility]) Patient will do better on diet will follow his sugars more closely and send [place] some readings for evaluation  2. Other hyperlipidemia Patient does not tolerate statins states every single one is tried caused some problems with pain does not want to go on any medication  Patient would do a follow-up visit in person with lab work in approximately 3 months

## 2019-12-14 NOTE — Telephone Encounter (Signed)
[name], Eugene [name] are scheduled for a virtual visit with your provider today.    Just as we do with appointments in the office, we must obtain your consent to participate.  Your consent will be active for this visit and any virtual visit you may have with one of our providers in the next 365 days.    If you have a MyChart account, I can also send a copy of this consent to you electronically.  All virtual visits are billed to your insurance company just like a traditional visit in the office.  As this is a virtual visit, video technology does not allow for your provider to perform a traditional examination.  This may limit your provider's ability to fully assess your condition.  If your provider identifies any concerns that need to be evaluated in person or the need to arrange testing such as labs, EKG, etc, we will make arrangements to do so.    Although advances in technology are sophisticated, we cannot ensure that it will always work on either your end or our end.  If the connection with a video visit is poor, we may have to switch to a telephone visit.  With either a video or telephone visit, we are not always able to ensure that we have a secure connection.   I need to obtain your verbal consent now.   Are you willing to proceed with your visit today?   Eugene [name] has provided verbal consent on 12/14/2019 for a virtual visit (video or telephone).   Mitzie Na, RN 12/14/2019  10:29 AM

## 2019-12-17 ENCOUNTER — Other Ambulatory Visit: Payer: Self-pay | Admitting: Family Medicine

## 2019-12-30 ENCOUNTER — Other Ambulatory Visit: Payer: Self-pay | Admitting: Family Medicine

## 2020-01-04 ENCOUNTER — Other Ambulatory Visit: Payer: Self-pay

## 2020-01-04 ENCOUNTER — Encounter (INDEPENDENT_AMBULATORY_CARE_PROVIDER_SITE_OTHER): Payer: Medicare HMO | Admitting: Ophthalmology

## 2020-01-04 DIAGNOSIS — I10 Essential (primary) hypertension: Secondary | ICD-10-CM | POA: Diagnosis not present

## 2020-01-04 DIAGNOSIS — H43813 Vitreous degeneration, bilateral: Secondary | ICD-10-CM | POA: Diagnosis not present

## 2020-01-04 DIAGNOSIS — E113311 Type 2 diabetes mellitus with moderate nonproliferative diabetic retinopathy with macular edema, right eye: Secondary | ICD-10-CM | POA: Diagnosis not present

## 2020-01-04 DIAGNOSIS — D3132 Benign neoplasm of left choroid: Secondary | ICD-10-CM | POA: Diagnosis not present

## 2020-01-04 DIAGNOSIS — H35033 Hypertensive retinopathy, bilateral: Secondary | ICD-10-CM

## 2020-01-04 DIAGNOSIS — E11311 Type 2 diabetes mellitus with unspecified diabetic retinopathy with macular edema: Secondary | ICD-10-CM

## 2020-01-04 DIAGNOSIS — E113512 Type 2 diabetes mellitus with proliferative diabetic retinopathy with macular edema, left eye: Secondary | ICD-10-CM

## 2020-01-08 ENCOUNTER — Telehealth: Payer: Self-pay | Admitting: Cardiovascular Disease

## 2020-01-08 NOTE — Telephone Encounter (Signed)
  Patient Consent for Virtual Visit         Eugene [name] has provided verbal consent on 01/08/2020 for a virtual visit (video or telephone).   CONSENT FOR VIRTUAL VISIT FOR:  Eugene [name]  By participating in this virtual visit I agree to the following:  I hereby voluntarily request, consent and authorize [facility] and its employed or contracted physicians, physician assistants, nurse practitioners or other licensed health care professionals (the Practitioner), to provide me with telemedicine health care services (the "Services") as deemed necessary by the treating Practitioner. I acknowledge and consent to receive the Services by the Practitioner via telemedicine. I understand that the telemedicine visit will involve communicating with the Practitioner through live audiovisual communication technology and the disclosure of certain medical information by electronic transmission. I acknowledge that I have been given the opportunity to request an in-person assessment or other available alternative prior to the telemedicine visit and am voluntarily participating in the telemedicine visit.  I understand that I have the right to withhold or withdraw my consent to the use of telemedicine in the course of my care at any time, without affecting my right to future care or treatment, and that the Practitioner or I may terminate the telemedicine visit at any time. I understand that I have the right to inspect all information obtained and/or recorded in the course of the telemedicine visit and may receive copies of available information for a reasonable fee.  I understand that some of the potential risks of receiving the Services via telemedicine include:  [name] Delay or interruption in medical evaluation due to technological equipment failure or disruption; . Information transmitted may not be sufficient (e.g. poor resolution of images) to allow for appropriate medical decision making by the  Practitioner; and/or  . In rare instances, security protocols could fail, causing a breach of personal health information.  Furthermore, I acknowledge that it is my responsibility to provide information about my medical history, conditions and care that is complete and accurate to the best of my ability. I acknowledge that Practitioner's advice, recommendations, and/or decision may be based on factors not within their control, such as incomplete or inaccurate data provided by me or distortions of diagnostic images or specimens that may result from electronic transmissions. I understand that the practice of medicine is not an exact science and that Practitioner makes no warranties or guarantees regarding treatment outcomes. I acknowledge that a copy of this consent can be made available to me via my patient portal ([name]), or I can request a printed copy by calling the office of [name].    I understand that my insurance will be billed for this visit.   I have read or had this consent read to me. . I understand the contents of this consent, which adequately explains the benefits and risks of the Services being provided via telemedicine.  . I have been provided ample opportunity to ask questions regarding this consent and the Services and have had my questions answered to my satisfaction. . I give my informed consent for the services to be provided through the use of telemedicine in my medical care

## 2020-01-12 ENCOUNTER — Encounter: Payer: Self-pay | Admitting: Cardiovascular Disease

## 2020-01-12 ENCOUNTER — Telehealth (INDEPENDENT_AMBULATORY_CARE_PROVIDER_SITE_OTHER): Payer: Medicare HMO | Admitting: Cardiovascular Disease

## 2020-01-12 VITALS — BP 151/80 | HR 70 | Ht 73.0 in | Wt 252.0 lb

## 2020-01-12 DIAGNOSIS — I5042 Chronic combined systolic (congestive) and diastolic (congestive) heart failure: Secondary | ICD-10-CM

## 2020-01-12 DIAGNOSIS — E785 Hyperlipidemia, unspecified: Secondary | ICD-10-CM

## 2020-01-12 DIAGNOSIS — I313 Pericardial effusion (noninflammatory): Secondary | ICD-10-CM | POA: Diagnosis not present

## 2020-01-12 DIAGNOSIS — I25708 Atherosclerosis of coronary artery bypass graft(s), unspecified, with other forms of angina pectoris: Secondary | ICD-10-CM | POA: Diagnosis not present

## 2020-01-12 DIAGNOSIS — N183 Chronic kidney disease, stage 3 unspecified: Secondary | ICD-10-CM

## 2020-01-12 DIAGNOSIS — Z789 Other specified health status: Secondary | ICD-10-CM

## 2020-01-12 DIAGNOSIS — I10 Essential (primary) hypertension: Secondary | ICD-10-CM

## 2020-01-12 DIAGNOSIS — I3139 Other pericardial effusion (noninflammatory): Secondary | ICD-10-CM

## 2020-01-12 NOTE — Progress Notes (Addendum)
Virtual Visit via Telephone Note   This visit type was conducted due to national recommendations for restrictions regarding the COVID-19 Pandemic (e.g. social distancing) in an effort to limit this patient's exposure and mitigate transmission in our community.  Due to his co-morbid illnesses, this patient is at least at moderate risk for complications without adequate follow up.  This format is felt to be most appropriate for this patient at this time.  The patient did not have access to video technology/had technical difficulties with video requiring transitioning to audio format only (telephone).  All issues noted in this document were discussed and addressed.  No physical exam could be performed with this format.  Please refer to the patient's chart for his  consent to telehealth for [facility].   The patient was identified using 2 identifiers.  Date:  01/12/2020   ID:  Eugene [name], DOB 18-Jan-1956, MRN [id number]  Patient Location: Home Provider Location: Office  PCP:  [name], MD  Cardiologist:  Kate Sable, MD  Electrophysiologist:  None   Evaluation Performed:  Follow-Up Visit  Chief Complaint:  CHF  History of Present Illness:    Eugene [name] is a 64 y.o. male with chronic diastolic heart failure, coronary artery disease, chronic kidney disease stage III, and recurrent pericardial effusion.       Past Medical History:  Diagnosis Date  . Anemia   . Anxiety   . Anxiety   . Arteriosclerotic cardiovascular disease (ASCVD)    a. 05/2011 s/p DES to PDA and RCA. b. 08/2012 Inflat  STEMI/Cath/PCI: LM minor irregs, LAD 50p, D1 50, LCX nl, OM1 25, RCA 30-40p, 100d (treated with 2.75x87mm Promus Premier DES);    [name] Asthma   . Bell palsy   . C. difficile colitis    a. 08/2012  . Cholelithiasis 07/2012   Asymptomatic; identified incidentally  . Chronic diastolic CHF (congestive heart failure) ([place])   . CKD (chronic kidney disease), stage III   .  Contrast dye induced nephropathy    a. 08/2012 post cath/pci  . COPD (chronic obstructive pulmonary disease) ([place])   . Diabetes mellitus    Peripheral neuropathy  . Gallstones   . GERD (gastroesophageal reflux disease)   . Gout   . Hyperlipidemia   . Hypertension   . Myocardial infarct ([place]) 09/08/12  . Nephrolithiasis   . Old myocardial infarction   . Pericardial effusion    a. s/p window in 2014. b. s/p redo window in 09/2018.  [name] Peripheral neuropathy   . PONV (postoperative nausea and vomiting)   . Sleep apnea   . Statin intolerance    Past Surgical History:  Procedure Laterality Date  . CARDIAC CATHETERIZATION    . CHEST TUBE INSERTION    . CIRCUMCISION    . COLONOSCOPY     In [facility], approximately 2011 per patient, was normal. Advised to come back in 10 years.  . ESOPHAGOGASTRODUODENOSCOPY     in [place] over 20 yrs ago  . ESOPHAGOGASTRODUODENOSCOPY  06/12/2012   LFY:BOFBPZ esophagus-status post Venia Minks dilation. Abnormal gastric mucosa of uncertain significance-status post biopsy  . EYE SURGERY    . INTRAOPERATIVE TRANSESOPHAGEAL ECHOCARDIOGRAM N/A 06/23/2013   Procedure: INTRAOPERATIVE TRANSESOPHAGEAL ECHOCARDIOGRAM;  Surgeon: Grace Isaac, MD;  Location: [facility];  Service: Open Heart Surgery;  Laterality: N/A;  . LEFT HEART CATHETERIZATION WITH CORONARY ANGIOGRAM N/A 09/08/2012   Procedure: LEFT HEART CATHETERIZATION WITH CORONARY ANGIOGRAM;  Surgeon: [name], MD;  Location:  [facility] CATH LAB;  Service: Cardiovascular;  Laterality: N/A;  . stents    . SUBXYPHOID PERICARDIAL WINDOW N/A 06/23/2013   Procedure: SUBXYPHOID PERICARDIAL WINDOW;  Surgeon: Grace Isaac, MD;  Location: [facility];  Service: Thoracic;  Laterality: N/A;  . SUBXYPHOID PERICARDIAL WINDOW  10/01/2018  . SUBXYPHOID PERICARDIAL WINDOW N/A 10/01/2018   Procedure: REDO SUBXYPHOID PERICARDIAL WINDOW;  Surgeon: Grace Isaac, MD;  Location: [facility];  Service: Thoracic;  Laterality:  N/A;  . TEE WITHOUT CARDIOVERSION N/A 10/01/2018   Procedure: TRANSESOPHAGEAL ECHOCARDIOGRAM (TEE);  Surgeon: Grace Isaac, MD;  Location: [facility] OR;  Service: Thoracic;  Laterality: N/A;     Current Meds  Medication Sig  . acetaminophen (TYLENOL) 500 MG tablet Take 1,500 mg by mouth every 6 (six) hours as needed for mild pain, fever or headache.   . allopurinol (ZYLOPRIM) 100 MG tablet Take 100 mg by mouth as needed.  . ALPRAZolam (XANAX) 0.5 MG tablet Take 1-2 tablets (0.5-1 mg total) by mouth at bedtime as needed for sleep.  [name] aspirin 81 MG chewable tablet Chew 81 mg by mouth at bedtime.  . Cholecalciferol (VITAMIN D3 PO) Take 1 capsule by mouth every other day.  . colchicine 0.6 MG tablet 1 twice daily as needed for gout flareup  . cyclobenzaprine (FLEXERIL) 5 MG tablet Take 1 tablet (5 mg total) by mouth 3 (three) times daily as needed for muscle spasms.  . DROPLET PEN NEEDLES 32G X 4 MM MISC USE EVERY DAY AS DIRECTED  . glimepiride (AMARYL) 1 MG tablet TAKE 1 TABLET WITH MEALS AS DIRECTED. MAX 3 TABLETS PER DAY  . glucose blood (ACCU-CHEK SMARTVIEW) test strip TEST BLOOD SUGAR THREE TIMES DAILY  . ibuprofen (ADVIL) 600 MG tablet Take 1 tablet (600 mg total) by mouth every 8 (eight) hours as needed for moderate pain.  . isosorbide mononitrate (IMDUR) 60 MG 24 hr tablet Take 1 tablet (60 mg total) by mouth daily.  [name] LEVEMIR FLEXTOUCH 100 UNIT/ML Pen INJECT 80 UNITS INTO THE SKIN DAILY AT 10 PM. (Patient taking differently: Inject 80 Units into the skin at bedtime. )  . losartan (COZAAR) 25 MG tablet TAKE 1 TABLET (25 MG TOTAL) BY MOUTH DAILY.  . methocarbamol (ROBAXIN) 750 MG tablet Take 1 tablet (750 mg total) by mouth 3 (three) times daily as needed (muscle spasm/pain).  . metoprolol tartrate (LOPRESSOR) 25 MG tablet Take 1 tablet (25 mg total) by mouth 2 (two) times daily.  [name] ofloxacin (FLOXIN) 0.3 % OTIC solution 5 drops as needed.  . torsemide (DEMADEX) 20 MG tablet TAKE 1 TO 2  TABLETS EACH MORNING AS DIRECTED BY PHYSICIAN  . traMADol (ULTRAM) 50 MG tablet TAKE 1 TABLET (50 MG TOTAL) BY MOUTH EVERY 6 (SIX) HOURS AS NEEDED     Allergies:   Hydrocodone, Lisinopril, Neurontin [gabapentin], Statins, Metformin and related, and Norvasc [amlodipine besylate]   Social History   Tobacco Use  . Smoking status: Never Smoker  . Smokeless tobacco: Never Used  Substance Use Topics  . Alcohol use: No    Alcohol/week: 0.0 standard drinks    Comment: heavy etoh use 30 years ago  . Drug use: No     Family Hx: The patient's family history includes Diabetes in his brother, brother, mother, and sister; Heart attack in his mother; Hypertension in his brother and brother; Sleep apnea in his sister; Stroke in his mother. There is no history of Colon cancer or Liver disease.  ROS:   Please  see the history of present illness.     All other systems reviewed and are negative.   Prior CV studies:   The following studies were reviewed today:  Echocardiogram 10/29/2019:  1. Left ventricular ejection fraction, by estimation, is 45 to 50%. The  left ventricle has mildly decreased function. There is severe left  ventricular hypertrophy of the posterior segment.  2. Right ventricular systolic function is normal. The right ventricular  size is normal.  3. Left atrial size was severely dilated.  4. Moderate pericardial effusion. The pericardial effusion is  circumferential. There is no evidence of cardiac tamponade.  5. The aortic valve was not well visualized.   Labs/Other Tests and Data Reviewed:    EKG:    Recent Labs: 02/22/2019: B Natriuretic Peptide 344.0; Magnesium 1.9 09/20/2019: ALT 16; BUN 40; Creatinine, Ser 1.62; Hemoglobin 12.6; Platelets 212; Potassium 4.7; Sodium 133   Recent Lipid Panel Lab Results  Component Value Date/Time   CHOL 223 (H) 08/06/2019 12:19 PM   TRIG 485 (H) 08/06/2019 12:19 PM   HDL 33 (L) 08/06/2019 12:19 PM   CHOLHDL 6.8 (H) 08/06/2019  12:19 PM   CHOLHDL 9.8 11/10/2015 05:21 AM   LDLCALC 107 (H) 08/06/2019 12:19 PM    Wt Readings from Last 3 Encounters:  01/12/20 252 lb (114.3 kg)  10/28/19 255 lb (115.7 kg)  09/20/19 259 lb (117.5 kg)     Objective:    Vital Signs:  BP (!) 151/80 Comment: taken yesterday 01/12/2020  Pulse 70   Ht 6\' 1"  (1.854 m)   Wt 252 lb (114.3 kg)   BMI 33.25 kg/m      ASSESSMENT & PLAN:    1. Chronic combined heart failure: LVEF 45 to 50% by echocardiogram on 10/29/2019 (no significant change from echo in June 2020).  Euvolemic on torsemide 20-40 mg daily. Chronic kidney disease stage III stable. Weight stable. He weighs daily.  2. Recurrent pericardial effusion status post window x2: Pericardial sac biopsy was negative for malignancy. The cells were benign/reactive in nature and not felt to be indicative of malignancy.  Moderate sized pericardial effusion by most recent echocardiogram in March 2021.  I have ordered a follow-up echocardiogram for September 2021.  3. Coronary disease status post CABG: No recurrent angina. LVEF 50%. Continue medical therapy with aspirin, Imdur 60 mg, and Lopressor.  Statin intolerant.  See discussion in #6.  4. Hypertension: Blood pressure is elevated. It fluctuates and was also reportedly normal on a number of occasions, 133/72 most recently.  5. Chronic kidney disease stage III: Stable.  Labs reviewed above.  6. Hyperlipidemia: Statin intolerant.  Lipids markedly elevated.  He will require PCSK9 inhibitor therapy.  He has refused these in the past. TG's markedly elevated. I will start Vascepa 2 gm BID.  I will make a referral to the lipid clinic. He is in agreement with this plan.    COVID-19 Education: The signs and symptoms of COVID-19 were discussed with the patient and how to seek care for testing (follow up with PCP or arrange E-visit).  The importance of social distancing was discussed today.  Time:   Today, I have spent 20 minutes  with the patient with telehealth technology discussing the above problems.     Medication Adjustments/Labs and Tests Ordered: Current medicines are reviewed at length with the patient today.  Concerns regarding medicines are outlined above.   Tests Ordered: No orders of the defined types were placed in this encounter.   Medication  Changes: No orders of the defined types were placed in this encounter.   Follow Up:  In Person in 6 month(s)  Signed, Kate Sable, MD  01/12/2020 11:09 AM    [place]

## 2020-01-14 NOTE — Patient Instructions (Addendum)
Medication Instructions:   Begin Vascepa 2gm twice a day - patient declines starting this now & wants to wait till he is seen in the [facility].   Labwork: none  Testing/Procedures: none  Follow-Up: 6 months   Any Other Special Instructions Will Be Listed Below (If Applicable). You have been referred to:  Lipid Clinic    If you need a refill on your cardiac medications before your next appointment, please call your pharmacy.

## 2020-01-14 NOTE — Addendum Note (Signed)
Addended by: Laurine Blazer on: 01/14/2020 12:53 PM   Modules accepted: Orders

## 2020-01-26 ENCOUNTER — Ambulatory Visit: Payer: Medicare HMO | Admitting: Podiatry

## 2020-01-26 ENCOUNTER — Other Ambulatory Visit: Payer: Self-pay

## 2020-01-26 ENCOUNTER — Encounter: Payer: Self-pay | Admitting: Podiatry

## 2020-01-26 DIAGNOSIS — E1142 Type 2 diabetes mellitus with diabetic polyneuropathy: Secondary | ICD-10-CM | POA: Diagnosis not present

## 2020-01-26 DIAGNOSIS — B351 Tinea unguium: Secondary | ICD-10-CM | POA: Diagnosis not present

## 2020-01-26 DIAGNOSIS — M2041 Other hammer toe(s) (acquired), right foot: Secondary | ICD-10-CM

## 2020-01-26 DIAGNOSIS — M2141 Flat foot [pes planus] (acquired), right foot: Secondary | ICD-10-CM

## 2020-01-26 DIAGNOSIS — M2142 Flat foot [pes planus] (acquired), left foot: Secondary | ICD-10-CM

## 2020-01-26 DIAGNOSIS — M2042 Other hammer toe(s) (acquired), left foot: Secondary | ICD-10-CM

## 2020-01-26 NOTE — Patient Instructions (Addendum)
Apply triple antibiotic ointment to right great toe once daily for one week.   Diabetes Mellitus and Foot Care Foot care is an important part of your health, especially when you have diabetes. Diabetes may cause you to have problems because of poor blood flow (circulation) to your feet and legs, which can cause your skin to:  Become thinner and drier.  Break more easily.  Heal more slowly.  Peel and crack. You may also have nerve damage (neuropathy) in your legs and feet, causing decreased feeling in them. This means that you may not notice minor injuries to your feet that could lead to more serious problems. Noticing and addressing any potential problems early is the best way to prevent future foot problems. How to care for your feet Foot hygiene  Wash your feet daily with warm water and mild soap. Do not use hot water. Then, pat your feet and the areas between your toes until they are completely dry. Do not soak your feet as this can dry your skin.  Trim your toenails straight across. Do not dig under them or around the cuticle. File the edges of your nails with an emery board or nail file.  Apply a moisturizing lotion or petroleum jelly to the skin on your feet and to dry, brittle toenails. Use lotion that does not contain alcohol and is unscented. Do not apply lotion between your toes. Shoes and socks  Wear clean socks or stockings every day. Make sure they are not too tight. Do not wear knee-high stockings since they may decrease blood flow to your legs.  Wear shoes that fit properly and have enough cushioning. Always look in your shoes before you put them on to be sure there are no objects inside.  To break in new shoes, wear them for just a few hours a day. This prevents injuries on your feet. Wounds, scrapes, corns, and calluses  Check your feet daily for blisters, cuts, bruises, sores, and redness. If you cannot see the bottom of your feet, use a mirror or ask someone for  help.  Do not cut corns or calluses or try to remove them with medicine.  If you find a minor scrape, cut, or break in the skin on your feet, keep it and the skin around it clean and dry. You may clean these areas with mild soap and water. Do not clean the area with peroxide, alcohol, or iodine.  If you have a wound, scrape, corn, or callus on your foot, look at it several times a day to make sure it is healing and not infected. Check for: ? Redness, swelling, or pain. ? Fluid or blood. ? Warmth. ? Pus or a bad smell. General instructions  Do not cross your legs. This may decrease blood flow to your feet.  Do not use heating pads or hot water bottles on your feet. They may burn your skin. If you have lost feeling in your feet or legs, you may not know this is happening until it is too late.  Protect your feet from hot and cold by wearing shoes, such as at the beach or on hot pavement.  Schedule a complete foot exam at least once a year (annually) or more often if you have foot problems. If you have foot problems, report any cuts, sores, or bruises to your health care provider immediately. Contact a health care provider if:  You have a medical condition that increases your risk of infection and you have any  cuts, sores, or bruises on your feet.  You have an injury that is not healing.  You have redness on your legs or feet.  You feel burning or tingling in your legs or feet.  You have pain or cramps in your legs and feet.  Your legs or feet are numb.  Your feet always feel cold.  You have pain around a toenail. Get help right away if:  You have a wound, scrape, corn, or callus on your foot and: ? You have pain, swelling, or redness that gets worse. ? You have fluid or blood coming from the wound, scrape, corn, or callus. ? Your wound, scrape, corn, or callus feels warm to the touch. ? You have pus or a bad smell coming from the wound, scrape, corn, or callus. ? You have a  fever. ? You have a red line going up your leg. Summary  Check your feet every day for cuts, sores, red spots, swelling, and blisters.  Moisturize feet and legs daily.  Wear shoes that fit properly and have enough cushioning.  If you have foot problems, report any cuts, sores, or bruises to your health care provider immediately.  Schedule a complete foot exam at least once a year (annually) or more often if you have foot problems. This information is not intended to replace advice given to you by your health care provider. Make sure you discuss any questions you have with your health care provider. Document Revised: 05/06/2019 Document Reviewed: 09/14/2016 Elsevier Patient Education  [place].

## 2020-01-31 NOTE — Progress Notes (Addendum)
Subjective: Eugene [name] presents today at risk foot care. Pt has h/o NIDDM with chronic kidney disease and painful mycotic nails b/l that are difficult to trim. Pain interferes with ambulation. Aggravating factors include wearing enclosed shoe gear. Pain is relieved with periodic professional debridement.   He did perform epsom salt soaks for his left great toe and it healed up fine. He voices no new pedal concerns on today's visit.  [name], MD is patient's PCP. Last visit was: 12/14/2019 via telemedicine visit.  Past Medical History:  Diagnosis Date  . Anemia   . Anxiety   . Anxiety   . Arteriosclerotic cardiovascular disease (ASCVD)    a. 05/2011 s/p DES to PDA and RCA. b. 08/2012 Inflat  STEMI/Cath/PCI: LM minor irregs, LAD 50p, D1 50, LCX nl, OM1 25, RCA 30-40p, 100d (treated with 2.75x22mm Promus Premier DES);    [name] Asthma   . Bell palsy   . C. difficile colitis    a. 08/2012  . Cholelithiasis 07/2012   Asymptomatic; identified incidentally  . Chronic diastolic CHF (congestive heart failure) ([place])   . CKD (chronic kidney disease), stage III   . Contrast dye induced nephropathy    a. 08/2012 post cath/pci  . COPD (chronic obstructive pulmonary disease) ([place])   . Diabetes mellitus    Peripheral neuropathy  . Gallstones   . GERD (gastroesophageal reflux disease)   . Gout   . Hyperlipidemia   . Hypertension   . Myocardial infarct ([place]) 09/08/12  . Nephrolithiasis   . Old myocardial infarction   . Pericardial effusion    a. s/p window in 2014. b. s/p redo window in 09/2018.  [name] Peripheral neuropathy   . PONV (postoperative nausea and vomiting)   . Sleep apnea   . Statin intolerance      Current Outpatient Medications on File Prior to Visit  Medication Sig Dispense Refill  . acetaminophen (TYLENOL) 500 MG tablet Take 1,500 mg by mouth every 6 (six) hours as needed for mild pain, fever or headache.     . allopurinol (ZYLOPRIM) 100 MG tablet Take 100 mg by mouth  as needed.    . ALPRAZolam (XANAX) 0.5 MG tablet Take 1-2 tablets (0.5-1 mg total) by mouth at bedtime as needed for sleep. 30 tablet 3  . aspirin 81 MG chewable tablet Chew 81 mg by mouth at bedtime.    . Cholecalciferol (VITAMIN D3 PO) Take 1 capsule by mouth every other day.    . colchicine 0.6 MG tablet 1 twice daily as needed for gout flareup 20 tablet 2  . cyclobenzaprine (FLEXERIL) 5 MG tablet Take 1 tablet (5 mg total) by mouth 3 (three) times daily as needed for muscle spasms. 30 tablet 0  . DROPLET PEN NEEDLES 32G X 4 MM MISC USE EVERY DAY AS DIRECTED 100 each 1  . glimepiride (AMARYL) 1 MG tablet TAKE 1 TABLET WITH MEALS AS DIRECTED. MAX 3 TABLETS PER DAY 270 tablet 1  . glucose blood (ACCU-CHEK SMARTVIEW) test strip TEST BLOOD SUGAR THREE TIMES DAILY 100 strip 5  . ibuprofen (ADVIL) 600 MG tablet Take 1 tablet (600 mg total) by mouth every 8 (eight) hours as needed for moderate pain. 15 tablet 0  . isosorbide mononitrate (IMDUR) 60 MG 24 hr tablet Take 1 tablet (60 mg total) by mouth daily. 90 tablet 2  . LEVEMIR FLEXTOUCH 100 UNIT/ML Pen INJECT 80 UNITS INTO THE SKIN DAILY AT 10 PM. (Patient taking differently: Inject 80 Units into  the skin at bedtime. ) 30 mL 5  . losartan (COZAAR) 25 MG tablet TAKE 1 TABLET (25 MG TOTAL) BY MOUTH DAILY. 90 tablet 2  . methocarbamol (ROBAXIN) 750 MG tablet Take 1 tablet (750 mg total) by mouth 3 (three) times daily as needed (muscle spasm/pain). 15 tablet 0  . metoprolol tartrate (LOPRESSOR) 25 MG tablet Take 1 tablet (25 mg total) by mouth 2 (two) times daily. 180 tablet 1  . ofloxacin (FLOXIN) 0.3 % OTIC solution 5 drops as needed.    [name] spironolactone (ALDACTONE) 25 MG tablet Take 0.5 tablets (12.5 mg total) by mouth daily. (Patient not taking: Reported on 10/28/2019) 30 tablet 0  . torsemide (DEMADEX) 20 MG tablet TAKE 1 TO 2 TABLETS EACH MORNING AS DIRECTED BY PHYSICIAN 180 tablet 1  . traMADol (ULTRAM) 50 MG tablet TAKE 1 TABLET (50 MG TOTAL) BY  MOUTH EVERY 6 (SIX) HOURS AS NEEDED 24 tablet 3   No current facility-administered medications on file prior to visit.     Allergies  Allergen Reactions  . Hydrocodone Nausea And Vomiting  . Lisinopril Cough  . Neurontin [Gabapentin] Other (See Comments)    Reaction:  Suicidal thoughts   . Statins Other (See Comments)    Reaction:  Muscle pain   . Metformin And Related Diarrhea  . Norvasc [Amlodipine Besylate] Swelling and Other (See Comments)    Reaction:  Pedal edema    Objective: Eugene [name] is a pleasant 64 y.o. y.o. Patient Race: White or Caucasian [1]  male in NAD. AAO x 3.  There were no vitals filed for this visit.  Vascular Examination: Neurovascular status unchanged b/l lower extremities. Capillary refill time to digits immediate b/l. Palpable DP pulses b/l. Palpable PT pulses b/l. Pedal hair sparse b/l. Skin temperature gradient within normal limits b/l.  Dermatological Examination: Pedal skin with normal turgor, texture and tone bilaterally. No open wounds bilaterally. No interdigital macerations bilaterally. Toenails 1-5 b/l elongated, discolored, dystrophic, thickened, crumbly with subungual debris and tenderness to dorsal palpation. Incurvated nailplate medial border(s) left hallux.  There is no nail border hypertrophy. There are no signs of infection.  Musculoskeletal: Normal muscle strength 5/5 to all lower extremity muscle groups bilaterally. No pain crepitus or joint limitation noted with ROM b/l. Hammertoes noted to the 2-5 bilaterally. Pes planus deformity noted b/l.   Neurological Examination: Protective sensation diminished with 10g monofilament b/l. Vibratory sensation diminished b/l. Proprioception intact bilaterally.  Assessment: 1. Onychomycosis   2. Acquired hammertoes of both feet   3. Pes planus of both feet   4. Diabetic polyneuropathy associated with type 2 diabetes mellitus ([place])   Plan: -Examined patient. -Toenails 1-5 b/l were  debrided in length and girth with sterile nail nippers and dremel without iatrogenic bleeding.  -Patient to continue soft, supportive shoe gear daily. -Patient to report any pedal injuries to medical professional immediately. -Offending nail border debrided and curretaged L hallux utilizing sterile nail nipper and currette. Light bleeding addressed with Lumicain Hemostatic solution. Border(s) cleansed with alcohol and triple antibiotic ointment applied. Patient instructed to apply triple antibiotic ointment to L hallux once daily for 7 days. -Patient/POA to call should there be question/concern in the interim.  Return in about 10 weeks (around 04/05/2020) for diabetic nail trim.  Marzetta Board, DPM

## 2020-02-08 ENCOUNTER — Telehealth: Payer: Self-pay | Admitting: Family Medicine

## 2020-02-08 NOTE — Telephone Encounter (Signed)
Patient is wanting to know if he needs labs done for 6 month follow up for 7/15. Please advise

## 2020-02-08 NOTE — Telephone Encounter (Signed)
Attempted to contact patient. Message states that pt is not accepting calls at this time. Pt has active lab orders that were placed in April.

## 2020-02-11 ENCOUNTER — Other Ambulatory Visit: Payer: Self-pay | Admitting: Family Medicine

## 2020-02-11 MED ORDER — TRAMADOL HCL 50 MG PO TABS: ORAL_TABLET | ORAL | 0 refills | Status: DC

## 2020-02-11 NOTE — Telephone Encounter (Signed)
May have refill please pend

## 2020-02-11 NOTE — Telephone Encounter (Signed)
Med pended and ready to sign 

## 2020-02-11 NOTE — Telephone Encounter (Signed)
done

## 2020-02-11 NOTE — Telephone Encounter (Signed)
[facility] requesting refill on Tramadol 50 mg tablet. Take one tablet po q 6 hrs prn. Please advise. Thank you

## 2020-02-11 NOTE — Telephone Encounter (Signed)
Pt.notified

## 2020-02-26 ENCOUNTER — Other Ambulatory Visit: Payer: Self-pay | Admitting: Family Medicine

## 2020-03-04 DIAGNOSIS — Z79899 Other long term (current) drug therapy: Secondary | ICD-10-CM | POA: Diagnosis not present

## 2020-03-04 DIAGNOSIS — E7849 Other hyperlipidemia: Secondary | ICD-10-CM | POA: Diagnosis not present

## 2020-03-04 DIAGNOSIS — E1165 Type 2 diabetes mellitus with hyperglycemia: Secondary | ICD-10-CM | POA: Diagnosis not present

## 2020-03-04 DIAGNOSIS — E118 Type 2 diabetes mellitus with unspecified complications: Secondary | ICD-10-CM | POA: Diagnosis not present

## 2020-03-05 LAB — HEPATIC FUNCTION PANEL
ALT: 10 IU/L (ref 0–44)
AST: 17 IU/L (ref 0–40)
Albumin: 3.9 g/dL (ref 3.8–4.8)
Alkaline Phosphatase: 100 IU/L (ref 48–121)
Bilirubin Total: 0.6 mg/dL (ref 0.0–1.2)
Bilirubin, Direct: 0.19 mg/dL (ref 0.00–0.40)
Total Protein: 6.4 g/dL (ref 6.0–8.5)

## 2020-03-05 LAB — HEMOGLOBIN A1C
Est. average glucose Bld gHb Est-mCnc: 163 mg/dL
Hgb A1c MFr Bld: 7.3 % — ABNORMAL HIGH (ref 4.8–5.6)

## 2020-03-05 LAB — BASIC METABOLIC PANEL
BUN/Creatinine Ratio: 23 (ref 10–24)
BUN: 41 mg/dL — ABNORMAL HIGH (ref 8–27)
CO2: 25 mmol/L (ref 20–29)
Calcium: 9.2 mg/dL (ref 8.6–10.2)
Chloride: 100 mmol/L (ref 96–106)
Creatinine, Ser: 1.81 mg/dL — ABNORMAL HIGH (ref 0.76–1.27)
GFR calc Af Amer: 45 mL/min/{1.73_m2} — ABNORMAL LOW (ref >59)
GFR calc non Af Amer: 39 mL/min/{1.73_m2} — ABNORMAL LOW (ref >59)
Glucose: 172 mg/dL — ABNORMAL HIGH (ref 65–99)
Potassium: 5.5 mmol/L — ABNORMAL HIGH (ref 3.5–5.2)
Sodium: 138 mmol/L (ref 134–144)

## 2020-03-05 LAB — LIPID PANEL
Chol/HDL Ratio: 7.6 ratio — ABNORMAL HIGH (ref 0.0–5.0)
Cholesterol, Total: 219 mg/dL — ABNORMAL HIGH (ref 100–199)
HDL: 29 mg/dL — ABNORMAL LOW (ref >39)
LDL Chol Calc (NIH): 143 mg/dL — ABNORMAL HIGH (ref 0–99)
Triglycerides: 254 mg/dL — ABNORMAL HIGH (ref 0–149)
VLDL Cholesterol Cal: 47 mg/dL — ABNORMAL HIGH (ref 5–40)

## 2020-03-10 ENCOUNTER — Telehealth (INDEPENDENT_AMBULATORY_CARE_PROVIDER_SITE_OTHER): Payer: Medicare HMO | Admitting: Internal Medicine

## 2020-03-10 ENCOUNTER — Encounter: Payer: Self-pay | Admitting: Internal Medicine

## 2020-03-10 ENCOUNTER — Ambulatory Visit (INDEPENDENT_AMBULATORY_CARE_PROVIDER_SITE_OTHER): Payer: Medicare HMO | Admitting: Family Medicine

## 2020-03-10 ENCOUNTER — Other Ambulatory Visit: Payer: Self-pay

## 2020-03-10 VITALS — BP 137/77 | HR 67 | Ht 73.0 in | Wt 255.0 lb

## 2020-03-10 VITALS — BP 138/78 | Temp 97.0°F | Ht 73.0 in | Wt 255.0 lb

## 2020-03-10 DIAGNOSIS — Z794 Long term (current) use of insulin: Secondary | ICD-10-CM | POA: Diagnosis not present

## 2020-03-10 DIAGNOSIS — G4730 Sleep apnea, unspecified: Secondary | ICD-10-CM | POA: Diagnosis not present

## 2020-03-10 DIAGNOSIS — E1121 Type 2 diabetes mellitus with diabetic nephropathy: Secondary | ICD-10-CM

## 2020-03-10 DIAGNOSIS — M791 Myalgia, unspecified site: Secondary | ICD-10-CM

## 2020-03-10 DIAGNOSIS — T466X5A Adverse effect of antihyperlipidemic and antiarteriosclerotic drugs, initial encounter: Secondary | ICD-10-CM | POA: Diagnosis not present

## 2020-03-10 DIAGNOSIS — N1831 Chronic kidney disease, stage 3a: Secondary | ICD-10-CM

## 2020-03-10 DIAGNOSIS — I25708 Atherosclerosis of coronary artery bypass graft(s), unspecified, with other forms of angina pectoris: Secondary | ICD-10-CM

## 2020-03-10 DIAGNOSIS — M109 Gout, unspecified: Secondary | ICD-10-CM

## 2020-03-10 DIAGNOSIS — I10 Essential (primary) hypertension: Secondary | ICD-10-CM | POA: Diagnosis not present

## 2020-03-10 DIAGNOSIS — E785 Hyperlipidemia, unspecified: Secondary | ICD-10-CM | POA: Diagnosis not present

## 2020-03-10 DIAGNOSIS — E875 Hyperkalemia: Secondary | ICD-10-CM

## 2020-03-10 DIAGNOSIS — N289 Disorder of kidney and ureter, unspecified: Secondary | ICD-10-CM | POA: Diagnosis not present

## 2020-03-10 NOTE — Patient Instructions (Addendum)
Medication Instructions:  BEGIN PCSK9 INHIBITOR  STOP TAKING YOUR ALDACTONE (SPIRONOLACTONE) *If you need a refill on your cardiac medications before your next appointment, please call your pharmacy*   Lab Work: FASTING LIPIDS IN 3 MONTHS If you have labs (blood work) drawn today and your tests are completely normal, you will receive your results only by: [name] MyChart Message (if you have MyChart) OR . A paper copy in the mail If you have any lab test that is abnormal or we need to change your treatment, we will call you to review the results.   Follow-Up: At [facility], you and your health needs are our priority.  As part of our continuing mission to provide you with exceptional heart care, we have created designated Provider Care Teams.  These Care Teams include your primary Cardiologist (physician) and Advanced Practice Providers (APPs -  Physician Assistants and Nurse Practitioners) who all work together to provide you with the care you need, when you need it.  We recommend signing up for the patient portal called "MyChart".  Sign up information is provided on this After Visit Summary.  MyChart is used to connect with patients for Virtual Visits (Telemedicine).  Patients are able to view lab/test results, encounter notes, upcoming appointments, etc.  Non-urgent messages can be sent to your provider as well.   To learn more about what you can do with MyChart, go to [email or web address].    Your next appointment:   3 month(s)  The format for your next appointment:   In Person  Provider:   K. Mali Hilty, MD

## 2020-03-10 NOTE — Progress Notes (Signed)
Virtual Visit via Telephone Note   This visit type was conducted due to national recommendations for restrictions regarding the COVID-19 Pandemic (e.g. social distancing) in an effort to limit this patient's exposure and mitigate transmission in our community.  Due to his co-morbid illnesses, this patient is at least at moderate risk for complications without adequate follow up.  This format is felt to be most appropriate for this patient at this time.  The patient did not have access to video technology/had technical difficulties with video requiring transitioning to audio format only (telephone).  All issues noted in this document were discussed and addressed.  No physical exam could be performed with this format.  Please refer to the patient's chart for his  consent to telehealth for [facility].   Evaluation Performed:  Telephone follow-up  Date:  03/10/2020   ID:  Eugene [name], DOB 27-Oct-1955, MRN [id number]  Patient Location:  [address] [place] [address]  Provider location:   [address] [place] [address] [place], [place] [address]  PCP:  Eugene Drown, MD  Cardiologist:  Eugene Sable, MD Electrophysiologist:  None   Chief Complaint: Dyslipidemia  History of Present Illness:    Eugene [name] is a 64 y.o. male who presents via audio/video conferencing for a telehealth visit today.  [name] is kindly referred by Dr. Bronson [name] for evaluation management of dyslipidemia.  He has a history of coronary artery disease status post CABG with an ischemic cardiomyopathy and LVEF 45 to 50%, stage III chronic kidney disease, recurrent pericardial effusion status post pericardial window x2, hypertension, type 2 diabetes and dyslipidemia with statin intolerance.  He was seen about 7 months ago with notable elevation in his cholesterol.  Total cholesterol was 223, triglycerides 485, HDL 33 and LDL 107.  Based on Dr. Court [name] discussion with the patient, they had  already discussed a PCSK9 inhibitor however [name] he apparently had declined it.  There was also discussion about adding Vascepa however this was never done.  Subsequently the patient worked very aggressively about improving his cholesterol.  Repeat labs were performed 6 days ago which do show a marked improvement in his numbers.  Total cholesterol was 219, triglycerides 254, HDL 29 and LDL 143.  Despite an increase in LDL he is had marked improvement in triglycerides.  In fact, hemoglobin A1c was 8.3 (7 months ago) and currently is 7.3.  He reported he does have an appointment with his primary care provider this afternoon to discuss the labs.  Overall he was very pleased.  However, despite aggressive dietary changes, cholesterol remains very elevated.  The patient does not have symptoms concerning for COVID-19 infection (fever, chills, cough, or new SHORTNESS OF BREATH).    Prior CV studies:   The following studies were reviewed today:  Chart reviewed, lab work  PMHx:  Past Medical History:  Diagnosis Date   Anemia    Anxiety    Anxiety    Arteriosclerotic cardiovascular disease (ASCVD)    a. 05/2011 s/p DES to PDA and RCA. b. 08/2012 Inflat  STEMI/Cath/PCI: LM minor irregs, LAD 50p, D1 50, LCX nl, OM1 25, RCA 30-40p, 100d (treated with 2.75x40mm Promus Premier DES);     Asthma    Bell palsy    C. difficile colitis    a. 08/2012   Cholelithiasis 07/2012   Asymptomatic; identified incidentally   Chronic diastolic CHF (congestive heart failure) ([facility])    CKD (chronic kidney disease), stage III  Contrast dye induced nephropathy    a. 08/2012 post cath/pci   COPD (chronic obstructive pulmonary disease) ([facility])    Diabetes mellitus    Peripheral neuropathy   Gallstones    GERD (gastroesophageal reflux disease)    Gout    Hyperlipidemia    Hypertension    Myocardial infarct ([place]) 09/08/12   Nephrolithiasis    Old myocardial infarction    Pericardial effusion      a. s/p window in 2014. b. s/p redo window in 09/2018.   Peripheral neuropathy    PONV (postoperative nausea and vomiting)    Sleep apnea    Statin intolerance     Past Surgical History:  Procedure Laterality Date   CARDIAC CATHETERIZATION     CHEST TUBE INSERTION     CIRCUMCISION     COLONOSCOPY     In [facility], approximately 2011 per patient, was normal. Advised to come back in 10 years.   ESOPHAGOGASTRODUODENOSCOPY     in [place] over 20 yrs ago   ESOPHAGOGASTRODUODENOSCOPY  06/12/2012   PTW:SFKCLE esophagus-status post [facility] dilation. Abnormal gastric mucosa of uncertain significance-status post biopsy   EYE SURGERY     INTRAOPERATIVE TRANSESOPHAGEAL ECHOCARDIOGRAM N/A 06/23/2013   Procedure: INTRAOPERATIVE TRANSESOPHAGEAL ECHOCARDIOGRAM;  Surgeon: Grace Isaac, MD;  Location: [facility];  Service: Open Heart Surgery;  Laterality: N/A;   LEFT HEART CATHETERIZATION WITH CORONARY ANGIOGRAM N/A 09/08/2012   Procedure: LEFT HEART CATHETERIZATION WITH CORONARY ANGIOGRAM;  Surgeon: [name], MD;  Location: [facility] CATH LAB;  Service: Cardiovascular;  Laterality: N/A;   stents     SUBXYPHOID PERICARDIAL WINDOW N/A 06/23/2013   Procedure: SUBXYPHOID PERICARDIAL WINDOW;  Surgeon: Grace Isaac, MD;  Location: [facility];  Service: Thoracic;  Laterality: N/A;   SUBXYPHOID PERICARDIAL WINDOW  10/01/2018   SUBXYPHOID PERICARDIAL WINDOW N/A 10/01/2018   Procedure: REDO SUBXYPHOID PERICARDIAL WINDOW;  Surgeon: Grace Isaac, MD;  Location: [facility];  Service: Thoracic;  Laterality: N/A;   TEE WITHOUT CARDIOVERSION N/A 10/01/2018   Procedure: TRANSESOPHAGEAL ECHOCARDIOGRAM (TEE);  Surgeon: Grace Isaac, MD;  [facility];  Service: Thoracic;  Laterality: N/A;    FAMHx:  Family History  Problem Relation Age of Onset   Diabetes Mother    Heart attack Mother    Stroke Mother    Diabetes Sister    Sleep apnea Sister    Hypertension Brother     Diabetes Brother    Diabetes Brother    Hypertension Brother    Colon cancer Neg Hx    Liver disease Neg Hx     SOCHx:   reports that he has never smoked. He has never used smokeless tobacco. He reports that he does not drink alcohol and does not use drugs.  ALLERGIES:  Allergies  Allergen Reactions   Hydrocodone Nausea And Vomiting   Lisinopril Cough   Neurontin [Gabapentin] Other (See Comments)    Reaction:  Suicidal thoughts    Statins Other (See Comments)    Reaction:  Muscle pain    Metformin And Related Diarrhea   Norvasc [Amlodipine Besylate] Swelling and Other (See Comments)    Reaction:  Pedal edema    MEDS:  Current Meds  Medication Sig   acetaminophen (TYLENOL) 500 MG tablet Take 1,500 mg by mouth every 6 (six) hours as needed for mild pain, fever or headache.    allopurinol (ZYLOPRIM) 100 MG tablet Take 100 mg by mouth as needed.   ALPRAZolam (XANAX) 0.5 MG tablet TAKE  1-2 TABLETS (0.5-1 MG TOTAL) BY MOUTH AT BEDTIME AS NEEDED FOR SLEEP.   aspirin 81 MG chewable tablet Chew 81 mg by mouth at bedtime.   colchicine 0.6 MG tablet 1 twice daily as needed for gout flareup   DROPLET PEN NEEDLES 32G X 4 MM MISC USE EVERY DAY AS DIRECTED   glimepiride (AMARYL) 1 MG tablet TAKE 1 TABLET WITH MEALS AS DIRECTED. MAX 3 TABLETS PER DAY   glucose blood (ACCU-CHEK SMARTVIEW) test strip TEST BLOOD SUGAR THREE TIMES DAILY   isosorbide mononitrate (IMDUR) 60 MG 24 hr tablet Take 1 tablet (60 mg total) by mouth daily.   LEVEMIR FLEXTOUCH 100 UNIT/ML Pen INJECT 80 UNITS INTO THE SKIN DAILY AT 10 PM. (Patient taking differently: Inject 80 Units into the skin at bedtime. )   losartan (COZAAR) 25 MG tablet TAKE 1 TABLET (25 MG TOTAL) BY MOUTH DAILY.   metoprolol tartrate (LOPRESSOR) 25 MG tablet Take 1 tablet (25 mg total) by mouth 2 (two) times daily.   ofloxacin (FLOXIN) 0.3 % OTIC solution 5 drops as needed.   torsemide (DEMADEX) 20 MG tablet TAKE 1 TO 2  TABLETS EACH MORNING AS DIRECTED BY PHYSICIAN   traMADol (ULTRAM) 50 MG tablet TAKE 1 TABLET (50 MG TOTAL) BY MOUTH EVERY 6 (SIX) HOURS AS NEEDED     ROS: Pertinent items noted in HPI and remainder of comprehensive ROS otherwise negative.  Labs/Other Tests and Data Reviewed:    Recent Labs: 09/20/2019: Hemoglobin 12.6; Platelets 212 03/04/2020: ALT 10; BUN 41; Creatinine, Ser 1.81; Potassium 5.5; Sodium 138   Recent Lipid Panel Lab Results  Component Value Date/Time   CHOL 219 (H) 03/04/2020 02:27 PM   TRIG 254 (H) 03/04/2020 02:27 PM   HDL 29 (L) 03/04/2020 02:27 PM   CHOLHDL 7.6 (H) 03/04/2020 02:27 PM   CHOLHDL 9.8 11/10/2015 05:21 AM   LDLCALC 143 (H) 03/04/2020 02:27 PM    Wt Readings from Last 3 Encounters:  03/10/20 255 lb (115.7 kg)  01/12/20 252 lb (114.3 kg)  10/28/19 255 lb (115.7 kg)     Exam:    Vital Signs:  BP 137/77    Pulse 67    Ht 6\' 1"  (1.854 m)    Wt 255 lb (115.7 kg)    BMI 33.64 kg/m    Exam deferred due to telephone visit  ASSESSMENT & PLAN:    1. Mixed hyperlipidemia, goal LDL less than 70 2. Statin intolerant-myalgias 3. Coronary artery disease status post CABG 4. Hypertension 5. Type 2 diabetes -A1c 7.3  Mr. Philippi has a mixed hyperlipidemia with high triglycerides and elevated LDL cholesterol and target less than 70.  Unfortunately he could not tolerate statins due to myalgias.  He has tried more than 3 including atorvastatin and rosuvastatin and I believe simvastatin.  He is a good candidate for PCSK9 inhibitor.  After further discussing it he understands that he be able to do the shots at home and was confused because he thought he would have to come in to the office every month.  This seems to be more palatable to him and he would be willing to agree to therapy with a PCSK9 inhibitor.  I would recommend Repatha 140 mg every 2 weeks.  We will go ahead and obtain prior authorization for this and start therapy.  Plan repeat lipids in about 3  months and follow-up with me in the office afterwards.  Finally, was noted that his potassium was elevated today.  It has  been in the past and was just stage III chronic kidney disease, his nephrologist had recommended just watching his diet.  I would recommend discontinuing his Aldactone.  I think there is little cardiovascular data of the benefit with his EF being close to 50%, but is clearly associated with hyperkalemia and likely should be discontinued.  COVID-19 Education: The signs and symptoms of COVID-19 were discussed with the patient and how to seek care for testing (follow up with PCP or arrange E-visit).  The importance of social distancing was discussed today.  Patient Risk:   After full review of this patients clinical status, I feel that they are at least moderate risk at this time.  Time:   Today, I have spent 25 minutes with the patient with telehealth technology discussing dyslipidemia, family history of coronary disease, statin intolerance, PCSK9 inhibitors, hyperkalemia.     Medication Adjustments/Labs and Tests Ordered: Current medicines are reviewed at length with the patient today.  Concerns regarding medicines are outlined above.   Tests Ordered: Orders Placed This Encounter  Procedures   Lipid panel    Medication Changes: No orders of the defined types were placed in this encounter.   Disposition:  in 3 month(s)  Pixie Casino, MD, [facility], [place] Director of the Advanced Lipid Disorders &  Cardiovascular Risk Reduction Clinic Diplomate of the American Board of Clinical Lipidology Attending Cardiologist  Direct Dial: [phone]   Fax: [phone]  Website:  [email or web address]  Pixie Casino, MD  03/10/2020 8:45 AM

## 2020-03-10 NOTE — Progress Notes (Addendum)
Subjective:    Patient ID: Eugene [name], male    DOB: [birth year], 64 y.o.   MRN: [id number]  Diabetes He presents for his follow-up diabetic visit. He has type 2 diabetes mellitus. Current diabetic treatment includes insulin injections and oral agent (monotherapy). He is compliant with treatment all of the time. His weight is stable. He is following a diabetic diet.   Essential hypertension - Plan: Basic metabolic panel, Uric acid  Type 2 diabetes mellitus with stage 3a chronic kidney disease, with long-term current use of insulin ([place]) - Plan: Basic metabolic panel  Sleep apnea, unspecified type - Plan: Ambulatory referral to Home Health  Renal insufficiency - Plan: Ambulatory referral to Nephrology  Gout, unspecified cause, unspecified chronicity, unspecified site - Plan: Uric acid     Review of Systems Patient relates a lot of fatigue tiredness feeling rundown gets out from energy quickly.  Finds himself falling asleep frequently.  Denies any chest tightness pressure pain.    Objective:   Physical Exam  Lungs are clear heart is regular HEENT benign extremities mild edema in the feet.  No ulcers seen in the feet. Results for orders placed or performed in visit on 12/14/19  Lipid panel  Result Value Ref Range   Cholesterol, Total 219 (H) 100 - 199 mg/dL   Triglycerides 254 (H) 0 - 149 mg/dL   HDL 29 (L) >39 mg/dL   VLDL Cholesterol Cal 47 (H) 5 - 40 mg/dL   LDL Chol Calc (NIH) 143 (H) 0 - 99 mg/dL   Chol/HDL Ratio 7.6 (H) 0.0 - 5.0 ratio  Basic metabolic panel  Result Value Ref Range   Glucose 172 (H) 65 - 99 mg/dL   BUN 41 (H) 8 - 27 mg/dL   Creatinine, Ser 1.81 (H) 0.76 - 1.27 mg/dL   GFR calc non Af Amer 39 (L) >59 mL/min/1.73   GFR calc Af Amer 45 (L) >59 mL/min/1.73   BUN/Creatinine Ratio 23 10 - 24   Sodium 138 134 - 144 mmol/L   Potassium 5.5 (H) 3.5 - 5.2 mmol/L   Chloride 100 96 - 106 mmol/L   CO2 25 20 - 29 mmol/L   Calcium 9.2 8.6 - 10.2 mg/dL    Hepatic function panel  Result Value Ref Range   Total Protein 6.4 6.0 - 8.5 g/dL   Albumin 3.9 3.8 - 4.8 g/dL   Bilirubin Total 0.6 0.0 - 1.2 mg/dL   Bilirubin, Direct 0.19 0.00 - 0.40 mg/dL   Alkaline Phosphatase 100 48 - 121 IU/L   AST 17 0 - 40 IU/L   ALT 10 0 - 44 IU/L  Hemoglobin A1c  Result Value Ref Range   Hgb A1c MFr Bld 7.3 (H) 4.8 - 5.6 %   Est. average glucose Bld gHb Est-mCnc 163 mg/dL   *Note: Due to a large number of results and/or encounters for the requested time period, some results have not been displayed. A complete set of results can be found in Results Review.   Fall Risk  09/11/2019 09/30/2018 12/13/2016 03/01/2016 11/14/2015  Falls in the past year? 0 0 No No No  Number falls in past yr: - 0 - - -  Injury with Fall? - 0 - - -  Risk for fall due to : Impaired balance/gait - - - -  Follow up Falls evaluation completed Falls evaluation completed - - -        Assessment & Plan:  1. Essential hypertension Blood pressure decent control  watch diet stay active continue medication - Basic metabolic panel - Uric acid  2. Type 2 diabetes mellitus with stage 3a chronic kidney disease, with long-term current use of insulin (HCC) A1c fairly decent very important for patient to continue current measures watch diet stay active repeat metabolic 7 in several weeks - Basic metabolic panel  3. Sleep apnea, unspecified type Will discuss with home health how his machine is doing and readout is doing may need a new machine. - Ambulatory referral to Home Health  4. Renal insufficiency Kidney functions need to be repeated.  Needs follow-up with nephrology. Stop Aldactone due to elevated potassium Recheck met 7 in a few weeks - Ambulatory referral to Nephrology  5. Gout, unspecified cause, unspecified chronicity, unspecified site Uric acid ordered.  Continue allopurinol.  Follow-up in about 4 months - Uric acid  Patient does have sleep apnea.  He does benefit from the  machine.  Allows him to function better feel better.  Patient is consistent with the use of the machine when it is working properly utilizing it greater than 4 hours per night.  It is medically indicated for patient to continue CPAP treatment

## 2020-03-12 ENCOUNTER — Encounter (HOSPITAL_COMMUNITY): Payer: Self-pay | Admitting: Emergency Medicine

## 2020-03-12 ENCOUNTER — Other Ambulatory Visit: Payer: Self-pay

## 2020-03-12 ENCOUNTER — Emergency Department (HOSPITAL_COMMUNITY): Payer: Medicare HMO

## 2020-03-12 ENCOUNTER — Inpatient Hospital Stay (HOSPITAL_COMMUNITY)
Admission: EM | Admit: 2020-03-12 | Discharge: 2020-03-18 | DRG: 280 | Disposition: A | Discharge status: Home / Self Care | Payer: Medicare HMO | Attending: Cardiology | Admitting: Cardiology

## 2020-03-12 ENCOUNTER — Inpatient Hospital Stay
Admission: AD | Admit: 2020-03-12 | Payer: Medicare HMO | Source: Other Acute Inpatient Hospital | Admitting: Cardiovascular Disease

## 2020-03-12 DIAGNOSIS — Z20822 Contact with and (suspected) exposure to covid-19: Secondary | ICD-10-CM | POA: Diagnosis not present

## 2020-03-12 DIAGNOSIS — E785 Hyperlipidemia, unspecified: Secondary | ICD-10-CM | POA: Diagnosis present

## 2020-03-12 DIAGNOSIS — E1149 Type 2 diabetes mellitus with other diabetic neurological complication: Secondary | ICD-10-CM | POA: Diagnosis present

## 2020-03-12 DIAGNOSIS — J449 Chronic obstructive pulmonary disease, unspecified: Secondary | ICD-10-CM | POA: Diagnosis present

## 2020-03-12 DIAGNOSIS — G4733 Obstructive sleep apnea (adult) (pediatric): Secondary | ICD-10-CM | POA: Diagnosis not present

## 2020-03-12 DIAGNOSIS — J9610 Chronic respiratory failure, unspecified whether with hypoxia or hypercapnia: Secondary | ICD-10-CM | POA: Diagnosis not present

## 2020-03-12 DIAGNOSIS — E1122 Type 2 diabetes mellitus with diabetic chronic kidney disease: Secondary | ICD-10-CM | POA: Diagnosis present

## 2020-03-12 DIAGNOSIS — M10341 Gout due to renal impairment, right hand: Secondary | ICD-10-CM | POA: Diagnosis not present

## 2020-03-12 DIAGNOSIS — I2119 ST elevation (STEMI) myocardial infarction involving other coronary artery of inferior wall: Principal | ICD-10-CM | POA: Diagnosis present

## 2020-03-12 DIAGNOSIS — I252 Old myocardial infarction: Secondary | ICD-10-CM

## 2020-03-12 DIAGNOSIS — N179 Acute kidney failure, unspecified: Secondary | ICD-10-CM | POA: Diagnosis not present

## 2020-03-12 DIAGNOSIS — I214 Non-ST elevation (NSTEMI) myocardial infarction: Secondary | ICD-10-CM | POA: Diagnosis not present

## 2020-03-12 DIAGNOSIS — I2510 Atherosclerotic heart disease of native coronary artery without angina pectoris: Secondary | ICD-10-CM | POA: Diagnosis present

## 2020-03-12 DIAGNOSIS — I517 Cardiomegaly: Secondary | ICD-10-CM | POA: Diagnosis not present

## 2020-03-12 DIAGNOSIS — N183 Chronic kidney disease, stage 3 unspecified: Secondary | ICD-10-CM | POA: Diagnosis not present

## 2020-03-12 DIAGNOSIS — E1142 Type 2 diabetes mellitus with diabetic polyneuropathy: Secondary | ICD-10-CM | POA: Diagnosis present

## 2020-03-12 DIAGNOSIS — N1832 Chronic kidney disease, stage 3b: Secondary | ICD-10-CM | POA: Diagnosis present

## 2020-03-12 DIAGNOSIS — Z789 Other specified health status: Secondary | ICD-10-CM | POA: Diagnosis not present

## 2020-03-12 DIAGNOSIS — R079 Chest pain, unspecified: Secondary | ICD-10-CM | POA: Diagnosis not present

## 2020-03-12 DIAGNOSIS — Z7982 Long term (current) use of aspirin: Secondary | ICD-10-CM | POA: Diagnosis not present

## 2020-03-12 DIAGNOSIS — R0689 Other abnormalities of breathing: Secondary | ICD-10-CM | POA: Diagnosis not present

## 2020-03-12 DIAGNOSIS — I313 Pericardial effusion (noninflammatory): Secondary | ICD-10-CM | POA: Diagnosis not present

## 2020-03-12 DIAGNOSIS — R0789 Other chest pain: Secondary | ICD-10-CM | POA: Diagnosis not present

## 2020-03-12 DIAGNOSIS — E118 Type 2 diabetes mellitus with unspecified complications: Secondary | ICD-10-CM | POA: Diagnosis not present

## 2020-03-12 DIAGNOSIS — I5023 Acute on chronic systolic (congestive) heart failure: Secondary | ICD-10-CM | POA: Diagnosis not present

## 2020-03-12 DIAGNOSIS — Z833 Family history of diabetes mellitus: Secondary | ICD-10-CM

## 2020-03-12 DIAGNOSIS — K219 Gastro-esophageal reflux disease without esophagitis: Secondary | ICD-10-CM | POA: Diagnosis not present

## 2020-03-12 DIAGNOSIS — I200 Unstable angina: Secondary | ICD-10-CM | POA: Diagnosis not present

## 2020-03-12 DIAGNOSIS — I10 Essential (primary) hypertension: Secondary | ICD-10-CM | POA: Diagnosis not present

## 2020-03-12 DIAGNOSIS — E669 Obesity, unspecified: Secondary | ICD-10-CM | POA: Diagnosis not present

## 2020-03-12 DIAGNOSIS — E7800 Pure hypercholesterolemia, unspecified: Secondary | ICD-10-CM | POA: Diagnosis not present

## 2020-03-12 DIAGNOSIS — I130 Hypertensive heart and chronic kidney disease with heart failure and stage 1 through stage 4 chronic kidney disease, or unspecified chronic kidney disease: Secondary | ICD-10-CM | POA: Diagnosis not present

## 2020-03-12 DIAGNOSIS — Z6833 Body mass index (BMI) 33.0-33.9, adult: Secondary | ICD-10-CM | POA: Diagnosis not present

## 2020-03-12 DIAGNOSIS — I25110 Atherosclerotic heart disease of native coronary artery with unstable angina pectoris: Secondary | ICD-10-CM | POA: Diagnosis present

## 2020-03-12 DIAGNOSIS — R6520 Severe sepsis without septic shock: Secondary | ICD-10-CM | POA: Diagnosis not present

## 2020-03-12 DIAGNOSIS — J9601 Acute respiratory failure with hypoxia: Secondary | ICD-10-CM | POA: Diagnosis not present

## 2020-03-12 DIAGNOSIS — I5042 Chronic combined systolic (congestive) and diastolic (congestive) heart failure: Secondary | ICD-10-CM | POA: Diagnosis not present

## 2020-03-12 DIAGNOSIS — I5041 Acute combined systolic (congestive) and diastolic (congestive) heart failure: Secondary | ICD-10-CM | POA: Diagnosis not present

## 2020-03-12 DIAGNOSIS — Z79899 Other long term (current) drug therapy: Secondary | ICD-10-CM | POA: Diagnosis not present

## 2020-03-12 DIAGNOSIS — Z794 Long term (current) use of insulin: Secondary | ICD-10-CM | POA: Diagnosis not present

## 2020-03-12 DIAGNOSIS — Z8249 Family history of ischemic heart disease and other diseases of the circulatory system: Secondary | ICD-10-CM | POA: Diagnosis not present

## 2020-03-12 DIAGNOSIS — E1169 Type 2 diabetes mellitus with other specified complication: Secondary | ICD-10-CM | POA: Diagnosis not present

## 2020-03-12 DIAGNOSIS — N189 Chronic kidney disease, unspecified: Secondary | ICD-10-CM

## 2020-03-12 DIAGNOSIS — I5031 Acute diastolic (congestive) heart failure: Secondary | ICD-10-CM | POA: Diagnosis present

## 2020-03-12 DIAGNOSIS — Z823 Family history of stroke: Secondary | ICD-10-CM

## 2020-03-12 DIAGNOSIS — I5043 Acute on chronic combined systolic (congestive) and diastolic (congestive) heart failure: Secondary | ICD-10-CM | POA: Diagnosis not present

## 2020-03-12 DIAGNOSIS — Z9861 Coronary angioplasty status: Secondary | ICD-10-CM | POA: Diagnosis present

## 2020-03-12 LAB — BASIC METABOLIC PANEL
Anion gap: 9 (ref 5–15)
BUN: 45 mg/dL — ABNORMAL HIGH (ref 8–23)
CO2: 25 mmol/L (ref 22–32)
Calcium: 9 mg/dL (ref 8.9–10.3)
Chloride: 101 mmol/L (ref 98–111)
Creatinine, Ser: 2.04 mg/dL — ABNORMAL HIGH (ref 0.61–1.24)
GFR calc Af Amer: 39 mL/min — ABNORMAL LOW (ref >60)
GFR calc non Af Amer: 34 mL/min — ABNORMAL LOW (ref >60)
Glucose, Bld: 256 mg/dL — ABNORMAL HIGH (ref 70–99)
Potassium: 5.2 mmol/L — ABNORMAL HIGH (ref 3.5–5.1)
Sodium: 135 mmol/L (ref 135–145)

## 2020-03-12 LAB — CBC
HCT: 35.2 % — ABNORMAL LOW (ref 39.0–52.0)
Hemoglobin: 11.2 g/dL — ABNORMAL LOW (ref 13.0–17.0)
MCH: 28.9 pg (ref 26.0–34.0)
MCHC: 31.8 g/dL (ref 30.0–36.0)
MCV: 90.7 fL (ref 80.0–100.0)
Platelets: 159 10*3/uL (ref 150–400)
RBC: 3.88 MIL/uL — ABNORMAL LOW (ref 4.22–5.81)
RDW: 15.9 % — ABNORMAL HIGH (ref 11.5–15.5)
WBC: 5.5 10*3/uL (ref 4.0–10.5)
nRBC: 0 % (ref 0.0–0.2)

## 2020-03-12 LAB — HEPARIN LEVEL (UNFRACTIONATED)
Heparin Unfractionated: 0.27 IU/mL — ABNORMAL LOW (ref 0.30–0.70)
Heparin Unfractionated: 0.31 IU/mL (ref 0.30–0.70)

## 2020-03-12 LAB — MRSA PCR SCREENING: MRSA by PCR: NEGATIVE

## 2020-03-12 LAB — CBG MONITORING, ED: Glucose-Capillary: 166 mg/dL — ABNORMAL HIGH (ref 70–99)

## 2020-03-12 LAB — TROPONIN I (HIGH SENSITIVITY)
Troponin I (High Sensitivity): 148 ng/L (ref <18)
Troponin I (High Sensitivity): 144 ng/L (ref <18)

## 2020-03-12 LAB — BRAIN NATRIURETIC PEPTIDE: B Natriuretic Peptide: 248 pg/mL — ABNORMAL HIGH (ref 0.0–100.0)

## 2020-03-12 LAB — SARS CORONAVIRUS 2 BY RT PCR (HOSPITAL ORDER, PERFORMED IN ~~LOC~~ HOSPITAL LAB): SARS Coronavirus 2: NEGATIVE

## 2020-03-12 MED ORDER — HEPARIN (PORCINE) 25000 UT/250ML-% IV SOLN
1900.0000 [IU]/h | INTRAVENOUS | Status: DC
  Administered 2020-03-12: 1450 [IU]/h via INTRAVENOUS
  Administered 2020-03-13: 1600 [IU]/h via INTRAVENOUS
  Administered 2020-03-14: 1700 [IU]/h via INTRAVENOUS
  Filled 2020-03-12 (×4): qty 250

## 2020-03-12 MED ORDER — FUROSEMIDE 10 MG/ML IJ SOLN
20.0000 mg | Freq: Once | INTRAMUSCULAR | Status: AC
  Administered 2020-03-12: 20 mg via INTRAVENOUS
  Filled 2020-03-12: qty 2

## 2020-03-12 MED ORDER — NITROGLYCERIN 0.4 MG SL SUBL
0.4000 mg | SUBLINGUAL_TABLET | Freq: Once | SUBLINGUAL | Status: AC
  Administered 2020-03-12: 0.4 mg via SUBLINGUAL
  Filled 2020-03-12: qty 1

## 2020-03-12 MED ORDER — ALPRAZOLAM 0.5 MG PO TABS: 0.5000 mg | ORAL_TABLET | Freq: Every evening | ORAL | Status: DC | PRN

## 2020-03-12 MED ORDER — INSULIN DETEMIR 100 UNIT/ML ~~LOC~~ SOLN
80.0000 [IU] | Freq: Every day | SUBCUTANEOUS | Status: DC
  Filled 2020-03-12 (×3): qty 0.8

## 2020-03-12 MED ORDER — NITROGLYCERIN IN D5W 200-5 MCG/ML-% IV SOLN
5.0000 ug/min | INTRAVENOUS | Status: DC
  Administered 2020-03-12: 5 ug/min via INTRAVENOUS
  Administered 2020-03-13: 45 ug/min via INTRAVENOUS
  Filled 2020-03-12 (×2): qty 250

## 2020-03-12 MED ORDER — GLIMEPIRIDE 2 MG PO TABS
1.0000 mg | ORAL_TABLET | Freq: Every day | ORAL | Status: DC
  Administered 2020-03-12 – 2020-03-13 (×2): 1 mg via ORAL
  Filled 2020-03-12: qty 0.5
  Filled 2020-03-12 (×3): qty 1

## 2020-03-12 MED ORDER — ISOSORBIDE MONONITRATE ER 60 MG PO TB24
60.0000 mg | ORAL_TABLET | Freq: Every day | ORAL | Status: DC
  Administered 2020-03-13 – 2020-03-18 (×5): 60 mg via ORAL
  Filled 2020-03-12 (×8): qty 1

## 2020-03-12 MED ORDER — LOSARTAN POTASSIUM 25 MG PO TABS
25.0000 mg | ORAL_TABLET | Freq: Every day | ORAL | Status: DC
  Administered 2020-03-12 – 2020-03-13 (×2): 25 mg via ORAL
  Filled 2020-03-12 (×2): qty 1

## 2020-03-12 MED ORDER — ALLOPURINOL 100 MG PO TABS
100.0000 mg | ORAL_TABLET | Freq: Every day | ORAL | Status: DC
  Administered 2020-03-13 – 2020-03-18 (×5): 100 mg via ORAL
  Filled 2020-03-12 (×8): qty 1

## 2020-03-12 MED ORDER — METOPROLOL TARTRATE 25 MG PO TABS
25.0000 mg | ORAL_TABLET | Freq: Two times a day (BID) | ORAL | Status: DC
  Administered 2020-03-12 – 2020-03-14 (×5): 25 mg via ORAL
  Filled 2020-03-12 (×5): qty 1

## 2020-03-12 MED ORDER — HEPARIN BOLUS VIA INFUSION
4000.0000 [IU] | Freq: Once | INTRAVENOUS | Status: AC
  Administered 2020-03-12: 4000 [IU] via INTRAVENOUS

## 2020-03-12 MED ORDER — ACETAMINOPHEN 500 MG PO TABS
500.0000 mg | ORAL_TABLET | Freq: Four times a day (QID) | ORAL | Status: DC | PRN
  Administered 2020-03-14: 500 mg via ORAL
  Filled 2020-03-12: qty 1

## 2020-03-12 MED ORDER — ASPIRIN 81 MG PO CHEW
81.0000 mg | CHEWABLE_TABLET | Freq: Every day | ORAL | Status: DC
  Administered 2020-03-13: 81 mg via ORAL
  Filled 2020-03-12 (×2): qty 1

## 2020-03-12 MED ORDER — ONDANSETRON HCL 4 MG/2ML IJ SOLN: 4.0000 mg | Freq: Four times a day (QID) | INTRAMUSCULAR | Status: DC | PRN

## 2020-03-12 MED ORDER — INSULIN DETEMIR 100 UNIT/ML ~~LOC~~ SOLN
60.0000 [IU] | Freq: Every day | SUBCUTANEOUS | Status: DC
  Administered 2020-03-12 – 2020-03-15 (×3): 60 [IU] via SUBCUTANEOUS
  Filled 2020-03-12 (×6): qty 0.6

## 2020-03-12 MED ORDER — TORSEMIDE 20 MG PO TABS
20.0000 mg | ORAL_TABLET | Freq: Every day | ORAL | Status: DC
  Administered 2020-03-12 – 2020-03-13 (×2): 20 mg via ORAL
  Filled 2020-03-12 (×4): qty 1

## 2020-03-12 NOTE — Progress Notes (Signed)
ANTICOAGULATION CONSULT NOTE - Initial Consult  Pharmacy Consult for Heparin Indication: chest pain/ACS  Allergies  Allergen Reactions  . Hydrocodone Nausea And Vomiting  . Lisinopril Cough  . Neurontin [Gabapentin] Other (See Comments)    Reaction:  Suicidal thoughts   . Statins Other (See Comments)    Reaction:  Muscle pain   . Metformin And Related Diarrhea  . Norvasc [Amlodipine Besylate] Swelling and Other (See Comments)    Reaction:  Pedal edema    Patient Measurements: Height: 6\' 1"  (185.4 cm) Weight: 115.7 kg (255 lb) IBW/kg (Calculated) : 79.9 Heparin Dosing Weight: 105 kg  Vital Signs: Temp: 97.9 F (36.6 C) (07/17 0237) Temp Source: Oral (07/17 0237) BP: 125/76 (07/17 1013) Pulse Rate: 53 (07/17 1013)  Labs: Recent Labs    03/12/20 0257 03/12/20 0537 03/12/20 1208  HGB 11.2*  --   --   HCT 35.2*  --   --   PLT 159  --   --   HEPARINUNFRC  --   --  0.27*  CREATININE 2.04*  --   --   TROPONINIHS 148* 144*  --     Estimated Creatinine Clearance: 49.4 mL/min (A) (by C-G formula based on SCr of 2.04 mg/dL (H)).   Assessment: 64 y.o. M presents with CP. To begin heparin for ACS. No AC PTA. CBC ok at baseline.  Goal of Therapy:  Heparin level 0.3-0.7 units/ml Monitor platelets by anticoagulation protocol: Yes   03/12/20 1300 update Heparin level: 0.27 IU/mL, slightly below therapeutic goal on heparin at 1450 units/hr CBC: Hb 11.2 (chronic anemia)   Plates: 159 RN reports no bleeding complications and no issues with infusion site.   Plan:  Increase heparin infusion rate to 1450 units/hr Re-check heparin level in ~6 hours Daily heparin level and CBC Monitor for s/s of bleeding.  Despina Pole, Pharm. D. Clinical Pharmacist 03/12/2020 1:13 PM

## 2020-03-12 NOTE — ED Notes (Signed)
Awaiting pharmacy for other PO meds

## 2020-03-12 NOTE — ED Triage Notes (Signed)
Patient came in for chest pain x 2 days. HX of two previous MI's. Patient was given 2 of nitro by EMS and patient took patient took 324 mg's at home. Pain is in the center of his chest.

## 2020-03-12 NOTE — ED Provider Notes (Signed)
[facility] Provider Note   CSN: [id number] Arrival date & time: 03/12/20  3382     History Chief Complaint  Patient presents with  . Chest Pain    Eugene [name] is a 64 y.o. male.  Patient presents via EMS with chest pain and pressure for the past 2 days.  States the pain is fairly constant in the center of his chest and radiates to his shoulders and the neck.  He took some nitroglycerin at home with partial relief.  Had the pain yesterday which she thought was indigestion but was not improving.  EMS gave him 2 nitroglycerin today and the pain is starting to improve.  Reports 2 MIs in the past with multiple stents.  He has never had this kind of pain before.  Denies any nausea, vomiting, sweating, cough or fever.  Feels his breathing is at baseline.  The pain is in the center of his chest and radiates to his shoulders and his neck.  Nitroglycerin does help.  He does not notice it is exertional or pleuritic.  No leg pain or leg swelling. Pain has not gone away since yesterday it always been fairly constant.  He took 1 nitroglycerin yesterday and 2 today.  The history is provided by the patient and the EMS personnel.       Past Medical History:  Diagnosis Date  . Anemia   . Anxiety   . Anxiety   . Arteriosclerotic cardiovascular disease (ASCVD)    a. 05/2011 s/p DES to PDA and RCA. b. 08/2012 Inflat  STEMI/Cath/PCI: LM minor irregs, LAD 50p, D1 50, LCX nl, OM1 25, RCA 30-40p, 100d (treated with 2.75x37mm Promus Premier DES);    [name] Asthma   . Bell palsy   . C. difficile colitis    a. 08/2012  . Cholelithiasis 07/2012   Asymptomatic; identified incidentally  . Chronic diastolic CHF (congestive heart failure) ([place])   . CKD (chronic kidney disease), stage III   . Contrast dye induced nephropathy    a. 08/2012 post cath/pci  . COPD (chronic obstructive pulmonary disease) ([place])   . Diabetes mellitus    Peripheral neuropathy  . Gallstones   . GERD  (gastroesophageal reflux disease)   . Gout   . Hyperlipidemia   . Hypertension   . Myocardial infarct ([place]) 09/08/12  . Nephrolithiasis   . Old myocardial infarction   . Pericardial effusion    a. s/p window in 2014. b. s/p redo window in 09/2018.  [name] Peripheral neuropathy   . PONV (postoperative nausea and vomiting)   . Sleep apnea   . Statin intolerance     Patient Active Problem List   Diagnosis Date Noted  . Kidney stones 10/28/2019  . Diabetes mellitus due to underlying condition, uncontrolled, with hyperglycemia, with long-term current use of insulin ([place]) 02/24/2019  . Acute exacerbation of CHF (congestive heart failure) /dCHF 02/22/2019  . S/P pericardial window creation 10/01/2018  . DM type 2 causing CKD stage 3 ([place]) 11/14/2016  . Acute on chronic systolic congestive heart failure ([place])   . Cardiomyopathy ([place])   . Acute on chronic diastolic CHF (congestive heart failure) ([place]) 11/10/2016  . Elevated troponin   . Coronary artery disease involving native coronary artery of native heart without angina pectoris   . Moderate nonproliferative diabetic retinopathy of both eyes without macular edema associated with type 2 diabetes mellitus ([place]) 10/22/2016  . Hyperkalemia 06/14/2016  . Non-ST elevation myocardial infarction (NSTEMI) ([place])   .  Morbid obesity ([place])   . Diabetes mellitus type 2, uncontrolled, with complications ([place]) 69/67/8938  . Right carotid bruit 11/10/2015  . Severe hypertension 11/09/2015  . Hypertensive urgency 11/09/2015  . Gout of wrist 08/08/2015  . Pericardial effusion without cardiac tamponade 05/26/2015  . HLD (hyperlipidemia) 05/26/2015  . Anxiety 05/26/2015  . Olecranon bursitis   . Inflammatory arthritis   . Elbow joint pain 01/21/2015  . SOB (shortness of breath) on exertion 12/26/2014  . Constipation 10/26/2014  . Peripheral edema 01/29/2014  . Acute on chronic diastolic heart failure ([place]) 01/26/2014  . Chest pain 12/13/2013  . SOB  (shortness of breath) 10/16/2013  . Chronic diastolic CHF (congestive heart failure) ([place]) 10/15/2013  . NSVT (nonsustained ventricular tachycardia) ([place]) 06/26/2013  . Type 2 diabetes mellitus with neurological manifestations ([place]) 06/26/2013  . Acute diastolic CHF (congestive heart failure) ([place]) 06/26/2013  . Recurrent Pericardial effusion/pericardial window on 06/23/2013 and 10/01/2018 06/26/2013  . Old myocardial infarction   . Gastroparesis due to DM ([place]) 05/21/2013  . CAD (coronary artery disease) 09/16/2012  . Acute on chronic renal insufficiency 09/14/2012  . Edema 08/18/2012  . Anemia, normocytic normochromic 07/31/2012  . Cholelithiasis 07/27/2012  . GERD (gastroesophageal reflux disease) 05/10/2012  . Noncompliance 05/09/2012  . Peripheral neuropathy 10/24/2011  . Obstructive sleep apnea- on C-pap 08/23/2011  . CAD- MI/RCA PCI-DES x2 2012, and RCA DES Jan 2014   . Hyperlipidemia   . Cholelithiasis 05/28/2011  . Essential hypertension 05/25/2011    Past Surgical History:  Procedure Laterality Date  . CARDIAC CATHETERIZATION    . CHEST TUBE INSERTION    . CIRCUMCISION    . COLONOSCOPY     In [facility], approximately 2011 per patient, was normal. Advised to come back in 10 years.  . ESOPHAGOGASTRODUODENOSCOPY     in [place] over 20 yrs ago  . ESOPHAGOGASTRODUODENOSCOPY  06/12/2012   BOF:BPZWCH esophagus-status post Venia Minks dilation. Abnormal gastric mucosa of uncertain significance-status post biopsy  . EYE SURGERY    . INTRAOPERATIVE TRANSESOPHAGEAL ECHOCARDIOGRAM N/A 06/23/2013   Procedure: INTRAOPERATIVE TRANSESOPHAGEAL ECHOCARDIOGRAM;  Surgeon: Grace Isaac, MD;  Location: [facility];  Service: Open Heart Surgery;  Laterality: N/A;  . LEFT HEART CATHETERIZATION WITH CORONARY ANGIOGRAM N/A 09/08/2012   Procedure: LEFT HEART CATHETERIZATION WITH CORONARY ANGIOGRAM;  Surgeon: [name], MD;  Location: [facility] CATH LAB;  Service: Cardiovascular;   Laterality: N/A;  . stents    . SUBXYPHOID PERICARDIAL WINDOW N/A 06/23/2013   Procedure: SUBXYPHOID PERICARDIAL WINDOW;  Surgeon: Grace Isaac, MD;  Location: [facility];  Service: Thoracic;  Laterality: N/A;  . SUBXYPHOID PERICARDIAL WINDOW  10/01/2018  . SUBXYPHOID PERICARDIAL WINDOW N/A 10/01/2018   Procedure: REDO SUBXYPHOID PERICARDIAL WINDOW;  Surgeon: Grace Isaac, MD;  Location: [facility];  Service: Thoracic;  Laterality: N/A;  . TEE WITHOUT CARDIOVERSION N/A 10/01/2018   Procedure: TRANSESOPHAGEAL ECHOCARDIOGRAM (TEE);  Surgeon: Grace Isaac, MD;  Location: [facility] OR;  Service: Thoracic;  Laterality: N/A;       Family History  Problem Relation Age of Onset  . Diabetes Mother   . Heart attack Mother   . Stroke Mother   . Diabetes Sister   . Sleep apnea Sister   . Hypertension Brother   . Diabetes Brother   . Diabetes Brother   . Hypertension Brother   . Colon cancer Neg Hx   . Liver disease Neg Hx     Social History   Tobacco Use  . Smoking status:  Never Smoker  . Smokeless tobacco: Never Used  Vaping Use  . Vaping Use: Never used  Substance Use Topics  . Alcohol use: No    Alcohol/week: 0.0 standard drinks    Comment: heavy etoh use 30 years ago  . Drug use: No    Home Medications Prior to Admission medications   Medication Sig Start Date End Date Taking? Authorizing Provider  acetaminophen (TYLENOL) 500 MG tablet Take 1,500 mg by mouth every 6 (six) hours as needed for mild pain, fever or headache.     [provider]  allopurinol (ZYLOPRIM) 100 MG tablet Take 100 mg by mouth as needed.    [provider]  ALPRAZolam (XANAX) 0.5 MG tablet TAKE 1-2 TABLETS (0.5-1 MG TOTAL) BY MOUTH AT BEDTIME AS NEEDED FOR SLEEP. 02/29/20   [name], MD  aspirin 81 MG chewable tablet Chew 81 mg by mouth at bedtime.    [provider]  Cholecalciferol (VITAMIN D3 PO) Take 1 capsule by mouth every other day. Patient not taking: Reported on  03/10/2020    [provider]  colchicine 0.6 MG tablet 1 twice daily as needed for gout flareup 07/30/19   Luking, Elayne Snare, MD  DROPLET PEN NEEDLES 32G X 4 MM MISC USE EVERY DAY AS DIRECTED 12/18/19   Luking, Elayne Snare, MD  glimepiride (AMARYL) 1 MG tablet TAKE 1 TABLET WITH MEALS AS DIRECTED. MAX 3 TABLETS PER DAY 12/31/19   Luking, Scott A, MD  glucose blood (ACCU-CHEK SMARTVIEW) test strip TEST BLOOD SUGAR THREE TIMES DAILY 10/29/19   [name], MD  isosorbide mononitrate (IMDUR) 60 MG 24 hr tablet Take 1 tablet (60 mg total) by mouth daily. 10/20/19   Herminio Commons, MD  LEVEMIR FLEXTOUCH 100 UNIT/ML Pen INJECT 80 UNITS INTO THE SKIN DAILY AT 10 PM. Patient taking differently: Inject 80 Units into the skin at bedtime.  09/08/19   [name], MD  losartan (COZAAR) 25 MG tablet TAKE 1 TABLET (25 MG TOTAL) BY MOUTH DAILY. 10/20/19   Herminio Commons, MD  metoprolol tartrate (LOPRESSOR) 25 MG tablet Take 1 tablet (25 mg total) by mouth 2 (two) times daily. 10/20/19   [name], MD  ofloxacin (FLOXIN) 0.3 % OTIC solution 5 drops as needed.    [provider]  torsemide (DEMADEX) 20 MG tablet TAKE 1 TO 2 TABLETS EACH MORNING AS DIRECTED BY PHYSICIAN 12/18/19   Luking, Elayne Snare, MD  traMADol (ULTRAM) 50 MG tablet TAKE 1 TABLET (50 MG TOTAL) BY MOUTH EVERY 6 (SIX) HOURS AS NEEDED 02/29/20   [name], MD    Allergies    Hydrocodone, Lisinopril, Neurontin [gabapentin], Statins, Metformin and related, and Norvasc [amlodipine besylate]  Review of Systems   Review of Systems  Constitutional: Negative for activity change, appetite change, fatigue and fever.  HENT: Negative for congestion and rhinorrhea.   Respiratory: Positive for chest tightness and shortness of breath.   Cardiovascular: Positive for chest pain. Negative for leg swelling.  Gastrointestinal: Negative for abdominal pain, nausea and vomiting.  Genitourinary: Negative for dysuria and hematuria.   Musculoskeletal: Negative for arthralgias and myalgias.  Skin: Negative for rash.  Neurological: Negative for dizziness, weakness and headaches.   all other systems are negative except as noted in the HPI and PMH.    Physical Exam Updated Vital Signs BP (!) 168/87 (BP Location: Right Arm)   Pulse 80   Temp 97.9 F (36.6 C) (Oral)  Resp 18   Ht 6\' 1"  (1.854 m)   Wt 115.7 kg   SpO2 100%   BMI 33.64 kg/m   Physical Exam Vitals and nursing note reviewed.  Constitutional:      General: He is not in acute distress.    Appearance: He is well-developed. He is obese.  HENT:     Head: Normocephalic and atraumatic.     Mouth/Throat:     Pharynx: No oropharyngeal exudate.  Eyes:     Conjunctiva/sclera: Conjunctivae normal.     Pupils: Pupils are equal, round, and reactive to light.  Neck:     Comments: No meningismus. Cardiovascular:     Rate and Rhythm: Normal rate and regular rhythm.     Heart sounds: Normal heart sounds. No murmur heard.   Pulmonary:     Effort: Pulmonary effort is normal. No respiratory distress.     Breath sounds: Rales present.     Comments: Bibasilar crackles Chest:     Chest wall: No tenderness.  Abdominal:     Palpations: Abdomen is soft.     Tenderness: There is no abdominal tenderness. There is no guarding or rebound.  Musculoskeletal:        General: No tenderness. Normal range of motion.     Cervical back: Normal range of motion and neck supple.  Skin:    General: Skin is warm.     Capillary Refill: Capillary refill takes less than 2 seconds.  Neurological:     General: No focal deficit present.     Mental Status: He is alert and oriented to person, place, and time. Mental status is at baseline.     Cranial Nerves: No cranial nerve deficit.     Motor: No abnormal muscle tone.     Coordination: Coordination normal.     Comments: No ataxia on finger to nose bilaterally. No pronator drift. 5/5 strength throughout. CN 2-12 intact.Equal grip  strength. Sensation intact.   Psychiatric:        Behavior: Behavior normal.     ED Results / Procedures / Treatments   Labs (all labs ordered are listed, but only abnormal results are displayed) Labs Reviewed  CBC - Abnormal; Notable for the following components:      Result Value   RBC 3.88 (*)    Hemoglobin 11.2 (*)    HCT 35.2 (*)    RDW 15.9 (*)    All other components within normal limits  BASIC METABOLIC PANEL - Abnormal; Notable for the following components:   Potassium 5.2 (*)    Glucose, Bld 256 (*)    BUN 45 (*)    Creatinine, Ser 2.04 (*)    GFR calc non Af Amer 34 (*)    GFR calc Af Amer 39 (*)    All other components within normal limits  BRAIN NATRIURETIC PEPTIDE - Abnormal; Notable for the following components:   B Natriuretic Peptide 248.0 (*)    All other components within normal limits  TROPONIN I (HIGH SENSITIVITY) - Abnormal; Notable for the following components:   Troponin I (High Sensitivity) 148 (*)    All other components within normal limits  TROPONIN I (HIGH SENSITIVITY) - Abnormal; Notable for the following components:   Troponin I (High Sensitivity) 144 (*)    All other components within normal limits  SARS CORONAVIRUS 2 BY RT PCR (HOSPITAL ORDER, [facility])  HEPARIN LEVEL (UNFRACTIONATED)    EKG EKG Interpretation  Date/Time:  Saturday  March 12 2020 04:08:28 EDT Ventricular Rate:  69 PR Interval:    QRS Duration: 114 QT Interval:  411 QTC Calculation: 441 R Axis:   -54 Text Interpretation: Sinus rhythm Borderline prolonged PR interval Incomplete right bundle branch block Inferior infarct, old Anterior infarct, old Lateral leads are also involved No significant change was found Confirmed by Ezequiel Essex [phone]) on 03/12/2020 4:16:44 AM   Radiology DG Chest Portable 1 View  Result Date: 03/12/2020 CLINICAL DATA:  Chest pain x2 days. EXAM: PORTABLE CHEST 1 VIEW COMPARISON:  February 22, 2019 FINDINGS: Mild,  diffusely increased interstitial lung markings are seen. There is no evidence of focal consolidation, pleural effusion or pneumothorax. The cardiac silhouette is markedly enlarged and unchanged in size. The visualized skeletal structures are unremarkable. IMPRESSION: 1. Mild, diffusely increased interstitial lung markings, likely consistent with mild interstitial edema. 2. Stable cardiomegaly. Electronically Signed   By: Virgina Norfolk M.D.   On: 03/12/2020 03:56    Procedures .Critical Care Performed by: Ezequiel Essex, MD Authorized by: Ezequiel Essex, MD   Critical care provider statement:    Critical care time (minutes):  45   Critical care was necessary to treat or prevent imminent or life-threatening deterioration of the following conditions: ACS.   Critical care was time spent personally by me on the following activities:  Discussions with consultants, evaluation of patient's response to treatment, examination of patient, ordering and performing treatments and interventions, ordering and review of laboratory studies, ordering and review of radiographic studies, pulse oximetry, re-evaluation of patient's condition, obtaining history from patient or surrogate and review of old charts   (including critical care time)  Medications Ordered in ED Medications - No data to display  ED Course  I have reviewed the triage vital signs and the nursing notes.  Pertinent labs & imaging results that were available during my care of the patient were reviewed by me and considered in my medical decision making (see chart for details).    MDM Rules/Calculators/A&P                         Chest pain concerning for angina.  Patient states nitroglycerin responsive pain.  Had a mild pain on arrival.  His EKG shows anterior Q waves with ST elevation in V1, V2 and V3 similar to previous.  Reviewed with Dr. Marletta Lor of cardiology who agrees. Does not meet STEMI criteria at this time.  Clinical mildly  elevated at 148.  Patient given aspirin.  Creatinine slightly worse at 2.0 with hyperkalemia 5.2.  X-ray shows diffuse interstitial markings with likely mild interstitial edema.  Last echocardiogram showed normal EF with diastolic dysfunction.  Patient given dose of IV Lasix.  Still having burning and tightness in his chest.  Will initiate IV nitroglycerin as well as IV heparin.  Discussed with cardiology again Dr. Marletta Lor who accepts patient to [facility] for rule out.  Continue IV heparin and IV nitroglycerin.  Chest pain improving with above measures.  Patient comfortable.  Blood pressure has normalized.  Bruin Bolger Rochel was evaluated in Emergency Department on 03/12/2020 for the symptoms described in the history of present illness. He was evaluated in the context of the global COVID-19 pandemic, which necessitated consideration that the patient might be at risk for infection with the SARS-CoV-2 virus that causes COVID-19. Institutional protocols and algorithms that pertain to the evaluation of patients at risk for COVID-19 are in a state of rapid change based on information  released by regulatory bodies including the CDC and federal and state organizations. These policies and algorithms were followed during the patient's care in the ED.   Final Clinical Impression(s) / ED Diagnoses Final diagnoses:  Unstable angina [facility])    Rx / DC Orders ED Discharge Orders    None       Amberlea Spagnuolo, Annie Main, MD 03/12/20 0710

## 2020-03-12 NOTE — ED Notes (Signed)
Date and time results received: 03/12/20 0348 Test: troponin Critical Value: 148  Name of Provider Notified: Ezequiel Essex, MD  Orders Received? Or Actions Taken?: Actions Taken: n/a

## 2020-03-12 NOTE — Progress Notes (Signed)
ANTICOAGULATION CONSULT NOTE - Initial Consult  Pharmacy Consult for Heparin Indication: chest pain/ACS  Allergies  Allergen Reactions   Hydrocodone Nausea And Vomiting   Lisinopril Cough   Neurontin [Gabapentin] Other (See Comments)    Reaction:  Suicidal thoughts    Statins Other (See Comments)    Reaction:  Muscle pain    Metformin And Related Diarrhea   Norvasc [Amlodipine Besylate] Swelling and Other (See Comments)    Reaction:  Pedal edema    Patient Measurements: Height: 6\' 1"  (185.4 cm) Weight: 115.7 kg (255 lb) IBW/kg (Calculated) : 79.9 Heparin Dosing Weight: 105 kg  Vital Signs: BP: 174/88 (07/17 1640) Pulse Rate: 60 (07/17 1530)  Labs: Recent Labs    03/12/20 0257 03/12/20 0537 03/12/20 1208 03/12/20 1737  HGB 11.2*  --   --   --   HCT 35.2*  --   --   --   PLT 159  --   --   --   HEPARINUNFRC  --   --  0.27* 0.31  CREATININE 2.04*  --   --   --   TROPONINIHS 148* 144*  --   --     Estimated Creatinine Clearance: 49.4 mL/min (A) (by C-G formula based on SCr of 2.04 mg/dL (H)).   Assessment: 64 y.o. M presents with CP. To begin heparin for ACS. No AC PTA. CBC ok at baseline.  Goal of Therapy:  Heparin level 0.3-0.7 units/ml Monitor platelets by anticoagulation protocol: Yes   03/12/20 1830 update Heparin level: 0.31 IU/mL, just within therapeutic goal on heparin at 1600 units/hr CBC: Hb 11.2 (chronic anemia)   Plates: 159 RN reports no bleeding complications and no issues with infusion site.   Plan:  Continue  heparin infusion rate at 1600 units/hr Re-check heparin level with am labs Daily heparin level and CBC Monitor for s/s of bleeding.  Despina Pole, Pharm. D. Clinical Pharmacist 03/12/2020 6:37 PM

## 2020-03-12 NOTE — ED Notes (Signed)
Pt currently eating a breakfast tray.

## 2020-03-12 NOTE — Progress Notes (Signed)
ANTICOAGULATION CONSULT NOTE - Initial Consult  Pharmacy Consult for Heparin Indication: chest pain/ACS  Allergies  Allergen Reactions  . Hydrocodone Nausea And Vomiting  . Lisinopril Cough  . Neurontin [Gabapentin] Other (See Comments)    Reaction:  Suicidal thoughts   . Statins Other (See Comments)    Reaction:  Muscle pain   . Metformin And Related Diarrhea  . Norvasc [Amlodipine Besylate] Swelling and Other (See Comments)    Reaction:  Pedal edema    Patient Measurements: Height: 6\' 1"  (185.4 cm) Weight: 115.7 kg (255 lb) IBW/kg (Calculated) : 79.9 Heparin Dosing Weight: 105 kg  Vital Signs: Temp: 97.9 F (36.6 C) (07/17 0237) Temp Source: Oral (07/17 0237) BP: 151/80 (07/17 0330) Pulse Rate: 75 (07/17 0300)  Labs: Recent Labs    03/12/20 0257  HGB 11.2*  HCT 35.2*  PLT 159  CREATININE 2.04*  TROPONINIHS 148*    Estimated Creatinine Clearance: 49.4 mL/min (A) (by C-G formula based on SCr of 2.04 mg/dL (H)).   Medical History: Past Medical History:  Diagnosis Date  . Anemia   . Anxiety   . Anxiety   . Arteriosclerotic cardiovascular disease (ASCVD)    a. 05/2011 s/p DES to PDA and RCA. b. 08/2012 Inflat  STEMI/Cath/PCI: LM minor irregs, LAD 50p, D1 50, LCX nl, OM1 25, RCA 30-40p, 100d (treated with 2.75x53mm Promus Premier DES);    [name] Asthma   . Bell palsy   . C. difficile colitis    a. 08/2012  . Cholelithiasis 07/2012   Asymptomatic; identified incidentally  . Chronic diastolic CHF (congestive heart failure) ([place])   . CKD (chronic kidney disease), stage III   . Contrast dye induced nephropathy    a. 08/2012 post cath/pci  . COPD (chronic obstructive pulmonary disease) ([place])   . Diabetes mellitus    Peripheral neuropathy  . Gallstones   . GERD (gastroesophageal reflux disease)   . Gout   . Hyperlipidemia   . Hypertension   . Myocardial infarct ([place]) 09/08/12  . Nephrolithiasis   . Old myocardial infarction   . Pericardial effusion    a. s/p  window in 2014. b. s/p redo window in 09/2018.  [name] Peripheral neuropathy   . PONV (postoperative nausea and vomiting)   . Sleep apnea   . Statin intolerance     Medications:  See electronic med rec  Assessment: 64 y.o. M presents with CP. To begin heparin for ACS. No AC PTA. CBC ok at baseline.  Goal of Therapy:  Heparin level 0.3-0.7 units/ml Monitor platelets by anticoagulation protocol: Yes   Plan:  Heparin IV bolus 4000 units Heparin gtt at 1450 units/hr Will f/u heparin level in 6 hours Daily heparin level and CBC  Sherlon Handing, PharmD, BCPS Please see amion for complete clinical pharmacist phone list 03/12/2020,4:03 AM

## 2020-03-12 NOTE — ED Notes (Addendum)
CRITICAL VALUE ALERT  Critical Value:  Trop 144  Date & Time Notied:  03/12/2020 9935  Provider Notified: Dr. Wyvonnia Dusky   Orders Received/Actions taken: None yet

## 2020-03-12 NOTE — H&P (Signed)
Cardiology Admission History and Physical:   Patient ID: Eugene [name]; MRN: [id number]; DOB: [birth year]   Admission date: 03/12/2020  Primary Care Provider:  [name], MD Primary Cardiologist:  Kate Sable, MD    Chief Complaint:    Chest pain  History of Present Illness:   Eugene [name] is a 64 y.o. male with a history of CAD (PCIs in 2012 and 2014), chronic diastolic CHF, COPD, type 2 diabetes mellitus, CKD stage III, GERD, gout, hypertension, hyperlipidemia, recurrent pericardial effusions (s/p redo window 09/2018), peripheral neuropathy and sleep apnea, who presented to the hospital with complaints of chest pain.  He described the pain as being substernal with radiation to the left shoulder.  The episode occurred when he was resting.  He took nitroglycerin at home with only partial improvement in his symptoms.  He denied any diaphoresis, nausea or vomiting.  He did not have any recent weight gain.  His leg edema was more or less stable in the past few days.  He stated compliance with all of his cardiac medications.  For continued chest pain he eventually presented to [facility].  The initial vital signs were: Blood pressure 168/87 with a heart rate of 80 bpm.  The electrocardiogram from 03/12/2020 02:35 showed normal sinus rhythm, rate of 80 bpm, Q waves in the anterior and inferior leads with T wave inversions in the lateral leads.  The high-sensitivity troponins were 148 and 144.  His chest pain improved once he was placed on IV nitroglycerin and IV heparin.  He was transferred to [facility] for further evaluation and work-up.  With respect to his previous cardiac history, in October 2012, he underwent placement of DES to the PDA and RCA.  In July 2014 he presented with an inferolateral ST elevation MI.  Cardiac cath showed: LM minor irregs, LAD 50p, D1 50, LCX nl, OM1 25, RCA 30-40p and 100% distal occlusion.  The distal RCA was therefore treated with  a 2.75x63mm Promus Premier DES.  The echocardiogram from 10/29/2019 revealed an EF of 45 to 50%.  There was severe LVH.  There was a moderate sized circumferential pericardial effusion without any evidence of tamponade.    Past Medical History:  Diagnosis Date  . Anemia   . Anxiety   . Anxiety   . Arteriosclerotic cardiovascular disease (ASCVD)    a. 05/2011 s/p DES to PDA and RCA. b. 08/2012 Inflat  STEMI/Cath/PCI: LM minor irregs, LAD 50p, D1 50, LCX nl, OM1 25, RCA 30-40p, 100d (treated with 2.75x70mm Promus Premier DES);    [name] Asthma   . Bell palsy   . C. difficile colitis    a. 08/2012  . Cholelithiasis 07/2012   Asymptomatic; identified incidentally  . Chronic diastolic CHF (congestive heart failure) ([place])   . CKD (chronic kidney disease), stage III   . Contrast dye induced nephropathy    a. 08/2012 post cath/pci  . COPD (chronic obstructive pulmonary disease) ([place])   . Diabetes mellitus    Peripheral neuropathy  . Gallstones   . GERD (gastroesophageal reflux disease)   . Gout   . Hyperlipidemia   . Hypertension   . Myocardial infarct ([place]) 09/08/12  . Nephrolithiasis   . Old myocardial infarction   . Pericardial effusion    a. s/p window in 2014. b. s/p redo window in 09/2018.  [name] Peripheral neuropathy   . PONV (postoperative nausea and vomiting)   . Sleep apnea   . Statin intolerance  Past Surgical History:  Procedure Laterality Date  . CARDIAC CATHETERIZATION    . CHEST TUBE INSERTION    . CIRCUMCISION    . COLONOSCOPY     In [facility], approximately 2011 per patient, was normal. Advised to come back in 10 years.  . ESOPHAGOGASTRODUODENOSCOPY     in [place] [facility] over 20 yrs ago  . ESOPHAGOGASTRODUODENOSCOPY  06/12/2012   WYO:VZCHYI esophagus-status post Venia Minks dilation. Abnormal gastric mucosa of uncertain significance-status post biopsy  . EYE SURGERY    . INTRAOPERATIVE TRANSESOPHAGEAL ECHOCARDIOGRAM N/A 06/23/2013   Procedure: INTRAOPERATIVE  TRANSESOPHAGEAL ECHOCARDIOGRAM;  Surgeon: Grace Isaac, MD;  Location: [facility];  Service: Open Heart Surgery;  Laterality: N/A;  . LEFT HEART CATHETERIZATION WITH CORONARY ANGIOGRAM N/A 09/08/2012   Procedure: LEFT HEART CATHETERIZATION WITH CORONARY ANGIOGRAM;  Surgeon: [name], MD;  Location: [facility] CATH LAB;  Service: Cardiovascular;  Laterality: N/A;  . stents    . SUBXYPHOID PERICARDIAL WINDOW N/A 06/23/2013   Procedure: SUBXYPHOID PERICARDIAL WINDOW;  Surgeon: Grace Isaac, MD;  Location: [facility];  Service: Thoracic;  Laterality: N/A;  . SUBXYPHOID PERICARDIAL WINDOW  10/01/2018  . SUBXYPHOID PERICARDIAL WINDOW N/A 10/01/2018   Procedure: REDO SUBXYPHOID PERICARDIAL WINDOW;  Surgeon: Grace Isaac, MD;  Location: [facility];  Service: Thoracic;  Laterality: N/A;  . TEE WITHOUT CARDIOVERSION N/A 10/01/2018   Procedure: TRANSESOPHAGEAL ECHOCARDIOGRAM (TEE);  Surgeon: Grace Isaac, MD;  Location: [facility] OR;  Service: Thoracic;  Laterality: N/A;     Medications Prior to Admission: Prior to Admission medications   Medication Sig Start Date End Date Taking? Authorizing Provider  ALPRAZolam (XANAX) 0.5 MG tablet TAKE 1-2 TABLETS (0.5-1 MG TOTAL) BY MOUTH AT BEDTIME AS NEEDED FOR SLEEP. 02/29/20  Yes [name], MD  aspirin 81 MG chewable tablet Chew 81 mg by mouth at bedtime.   Yes [provider]  glimepiride (AMARYL) 1 MG tablet TAKE 1 TABLET WITH MEALS AS DIRECTED. MAX 3 TABLETS PER DAY 12/31/19  Yes Luking, Scott A, MD  isosorbide mononitrate (IMDUR) 60 MG 24 hr tablet Take 1 tablet (60 mg total) by mouth daily. 10/20/19  Yes Herminio Commons, MD  LEVEMIR FLEXTOUCH 100 UNIT/ML Pen INJECT 80 UNITS INTO THE SKIN DAILY AT 10 PM. Patient taking differently: Inject 60 Units into the skin at bedtime.  09/08/19  Yes Luking, Elayne Snare, MD  losartan (COZAAR) 25 MG tablet TAKE 1 TABLET (25 MG TOTAL) BY MOUTH DAILY. 10/20/19  Yes Herminio Commons, MD  metoprolol tartrate (LOPRESSOR)  25 MG tablet Take 1 tablet (25 mg total) by mouth 2 (two) times daily. 10/20/19  Yes [name], MD  torsemide (DEMADEX) 20 MG tablet TAKE 1 TO 2 TABLETS EACH MORNING AS DIRECTED BY PHYSICIAN 12/18/19  Yes Luking, Elayne Snare, MD  acetaminophen (TYLENOL) 500 MG tablet Take 1,500 mg by mouth every 6 (six) hours as needed for mild pain, fever or headache.     [provider]  allopurinol (ZYLOPRIM) 100 MG tablet Take 100 mg by mouth as needed.    [provider]  colchicine 0.6 MG tablet 1 twice daily as needed for gout flareup 07/30/19   Luking, Elayne Snare, MD  ofloxacin (FLOXIN) 0.3 % OTIC solution 5 drops as needed.    [provider]  traMADol (ULTRAM) 50 MG tablet TAKE 1 TABLET (50 MG TOTAL) BY MOUTH EVERY 6 (SIX) HOURS AS NEEDED 02/29/20   [name], MD     Allergies:  Allergies  Allergen Reactions  . Hydrocodone Nausea And Vomiting  . Lisinopril Cough  . Neurontin [Gabapentin] Other (See Comments)    Reaction:  Suicidal thoughts   . Statins Other (See Comments)    Reaction:  Muscle pain   . Metformin And Related Diarrhea  . Norvasc [Amlodipine Besylate] Swelling and Other (See Comments)    Reaction:  Pedal edema    Social History:   Social History   Socioeconomic History  . Marital status: Married    Spouse name: Not on file  . Number of children: 2  . Years of education: Not on file  . Highest education level: Not on file  Occupational History  . 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 Smoking status: Never Smoker  . Smokeless tobacco: Never Used  Vaping Use  . Vaping Use: Never used  Substance and Sexual Activity  . Alcohol use: No    Alcohol/week: 0.0 standard drinks    Comment: heavy etoh use 30 years ago  . Drug use: No  . Sexual activity: Yes    Birth control/protection: None  Other Topics Concern  . 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 Disabled at this point.   Social Determinants of Health   Financial Resource Strain:   . Difficulty of Paying Living Expenses:   Food Insecurity:   . Worried About Charity fundraiser in the Last Year:   . Arboriculturist in the Last Year:   Transportation Needs:   . Film/video editor (Medical):   [name] Lack of Transportation (Non-Medical):   Physical Activity:   . Days of Exercise per Week:   . Minutes of Exercise per Session:   Stress:   . Feeling of Stress :   Social Connections:   . Frequency of Communication with Friends and Family:   . Frequency of Social Gatherings with Friends and Family:   . Attends Religious Services:   . Active Member of Clubs or Organizations:   . Attends Archivist Meetings:   [name] Marital Status:   Intimate Partner Violence:   . Fear of Current or Ex-Partner:   . Emotionally Abused:   [name] Physically Abused:   . Sexually Abused:      Family History:   The patient's family history includes Diabetes in his brother, brother, mother, and sister; Heart attack in his mother; Hypertension in his brother and brother; Sleep apnea in his sister; Stroke in his mother. There is no history of Colon cancer or Liver disease.     Review of Systems: [y] = yes, [ ]  = no   . General: Weight gain [ ] ; Weight loss [ ] ; Anorexia [ ] ; Fatigue [ ] ; Fever [ ] ; Chills [ ] ; Weakness [ ]   . Cardiac: Chest pain/pressure [Y]; Resting SOB [ ] ; Exertional SOB [Y]; Orthopnea [ ] ; Pedal Edema [ ] ; Palpitations [ ] ; Syncope [ ] ; Presyncope [ ] ; Paroxysmal nocturnal dyspnea[ ]   . Pulmonary: Cough [ ] ; Wheezing[ ] ; Hemoptysis[ ] ; Sputum [ ] ; Snoring [ ]   . GI: Vomiting[ ] ; Dysphagia[ ] ; Melena[ ] ; Hematochezia [ ] ; Heartburn[ ] ; Abdominal pain [ ] ; Constipation [ ] ; Diarrhea [ ] ; BRBPR [ ]   . GU: Hematuria[ ] ; Dysuria [ ] ; Nocturia[ ]   . Vascular: Pain in legs with walking [ ] ; Pain in feet with lying flat [ ] ;  Non-healing sores [ ] ; Stroke [ ] ; TIA [  ]; Slurred speech [ ] ;  . Neuro: Headaches[ ] ; Vertigo[ ] ; Seizures[ ] ; Paresthesias[ ] ;Blurred vision [ ] ; Diplopia [ ] ; Vision changes [ ]   . Ortho/Skin: Arthritis [ ] ; Joint pain [ ] ; Muscle pain [ ] ; Joint swelling [ ] ; Back Pain [ ] ; Rash [ ]   . Psych: Depression[ ] ; Anxiety[ ]   . Heme: Bleeding problems [ ] ; Clotting disorders [ ] ; Anemia [ ]   . Endocrine: Diabetes [ ] ; Thyroid dysfunction[ ]      Physical Exam/Data:   Vitals:   03/12/20 2023 03/12/20 2025 03/12/20 2132 03/12/20 2240  BP: (!) 167/71  (!) 141/86 (!) 150/83  Pulse:  75 97 89  Resp: 20 16 19    Temp:   97.9 F (36.6 C)   TempSrc:   Oral   SpO2:  94% 97%   Weight:   117.8 kg   Height:   6\' 1"  (1.854 m)    No intake or output data in the 24 hours ending 03/12/20 2253 Filed Weights   03/12/20 0229 03/12/20 2132  Weight: 115.7 kg 117.8 kg   Body mass index is 34.26 kg/m.  General:  Well nourished, well developed, in no acute distress HEENT: normal Lymph: no adenopathy Neck: no JVD Endocrine:  No thryomegaly Vascular: No carotid bruits; FA pulses 2+ bilaterally without bruits  Cardiac:  normal S1, S2; RRR; no murmur  Lungs:  clear to auscultation bilaterally, no wheezing, rhonchi or rales  Abd: soft, nontender, no hepatomegaly  Ext: LE 1+ edema bilaterally Musculoskeletal:  No deformities, BUE and BLE strength normal and equal Skin: warm and dry  Neuro:  CNs 2-12 intact, no focal abnormalities noted Psych:  Normal affect    Laboratory Data:  Chemistry Recent Labs  Lab 03/12/20 0257  NA 135  K 5.2*  CL 101  CO2 25  GLUCOSE 256*  BUN 45*  CREATININE 2.04*  CALCIUM 9.0  GFRNONAA 34*  GFRAA 39*  ANIONGAP 9    No results for input(s): PROT, ALBUMIN, AST, ALT, ALKPHOS, BILITOT in the last 168 hours. Hematology Recent Labs  Lab 03/12/20 0257  WBC 5.5  RBC 3.88*  HGB 11.2*  HCT 35.2*  MCV 90.7  MCH 28.9  MCHC 31.8  RDW 15.9*  PLT 159   Cardiac EnzymesNo results for input(s): TROPONINI  in the last 168 hours. No results for input(s): TROPIPOC in the last 168 hours.  BNP Recent Labs  Lab 03/12/20 0300  BNP 248.0*    DDimer No results for input(s): DDIMER in the last 168 hours.  Radiology/Studies:  DG Chest Portable 1 View  Result Date: 03/12/2020 CLINICAL DATA:  Chest pain x2 days. EXAM: PORTABLE CHEST 1 VIEW COMPARISON:  February 22, 2019 FINDINGS: Mild, diffusely increased interstitial lung markings are seen. There is no evidence of focal consolidation, pleural effusion or pneumothorax. The cardiac silhouette is markedly enlarged and unchanged in size. The visualized skeletal structures are unremarkable. IMPRESSION: 1. Mild, diffusely increased interstitial lung markings, likely consistent with mild interstitial edema. 2. Stable cardiomegaly. Electronically Signed   By: Virgina Norfolk M.D.   On: 03/12/2020 03:56    Assessment and Plan:   1. Non-ST elevation myocardial infarction  -Serial ECGs -Continue to trend cardiac enzymes -ASA 81 mg daily -High dose statins -Check a lipid panel -Nitroglycerin for chest pain -Unfractionated heparin IV  2. Chronic combined heart failure  -Daily weights -Strict I and Os -Continue torsemide -Continue losartan -Continue metoprolol (  consider change to succinate from tartrate) Spironolactone discontinued previously due to hyperkalemia   Severity of Illness: The appropriate patient status for this patient is INPATIENT. Inpatient status is judged to be reasonable and necessary in order to provide the required intensity of service to ensure the patient's safety. The patient's presenting symptoms, physical exam findings, and initial radiographic and laboratory data in the context of their chronic comorbidities is felt to place them at high risk for further clinical deterioration. Furthermore, it is not anticipated that the patient will be medically stable for discharge from the hospital within 2 midnights of admission. The following  factors support the patient status of inpatient.   " The patient's presenting symptoms include chest pain. " The worrisome physical exam findings include NA. " The initial radiographic and laboratory data are worrisome because of elevated troponin. " The chronic co-morbidities include hypertension.   * I certify that at the point of admission it is my clinical judgment that the patient will require inpatient hospital care spanning beyond 2 midnights from the point of admission due to high intensity of service, high risk for further deterioration and high frequency of surveillance required.*    For questions or updates, please contact [place] Please consult [email or web address] for contact info under Cardiology/STEMI.    Signed, Meade Maw, MD  03/12/2020 10:53 PM

## 2020-03-13 DIAGNOSIS — I5042 Chronic combined systolic (congestive) and diastolic (congestive) heart failure: Secondary | ICD-10-CM

## 2020-03-13 DIAGNOSIS — N179 Acute kidney failure, unspecified: Secondary | ICD-10-CM | POA: Diagnosis not present

## 2020-03-13 DIAGNOSIS — I313 Pericardial effusion (noninflammatory): Secondary | ICD-10-CM

## 2020-03-13 DIAGNOSIS — I214 Non-ST elevation (NSTEMI) myocardial infarction: Secondary | ICD-10-CM | POA: Diagnosis not present

## 2020-03-13 DIAGNOSIS — E118 Type 2 diabetes mellitus with unspecified complications: Secondary | ICD-10-CM | POA: Diagnosis not present

## 2020-03-13 DIAGNOSIS — N1832 Chronic kidney disease, stage 3b: Secondary | ICD-10-CM

## 2020-03-13 DIAGNOSIS — Z794 Long term (current) use of insulin: Secondary | ICD-10-CM

## 2020-03-13 LAB — PROTIME-INR
INR: 1.1 (ref 0.8–1.2)
Prothrombin Time: 13.7 seconds (ref 11.4–15.2)

## 2020-03-13 LAB — BASIC METABOLIC PANEL
Anion gap: 10 (ref 5–15)
BUN: 40 mg/dL — ABNORMAL HIGH (ref 8–23)
CO2: 25 mmol/L (ref 22–32)
Calcium: 8.8 mg/dL — ABNORMAL LOW (ref 8.9–10.3)
Chloride: 102 mmol/L (ref 98–111)
Creatinine, Ser: 2.01 mg/dL — ABNORMAL HIGH (ref 0.61–1.24)
GFR calc Af Amer: 40 mL/min — ABNORMAL LOW (ref >60)
GFR calc non Af Amer: 34 mL/min — ABNORMAL LOW (ref >60)
Glucose, Bld: 184 mg/dL — ABNORMAL HIGH (ref 70–99)
Potassium: 5.1 mmol/L (ref 3.5–5.1)
Sodium: 137 mmol/L (ref 135–145)

## 2020-03-13 LAB — GLUCOSE, CAPILLARY
Glucose-Capillary: 130 mg/dL — ABNORMAL HIGH (ref 70–99)
Glucose-Capillary: 137 mg/dL — ABNORMAL HIGH (ref 70–99)
Glucose-Capillary: 112 mg/dL — ABNORMAL HIGH (ref 70–99)
Glucose-Capillary: 153 mg/dL — ABNORMAL HIGH (ref 70–99)

## 2020-03-13 LAB — HEPARIN LEVEL (UNFRACTIONATED): Heparin Unfractionated: 0.29 IU/mL — ABNORMAL LOW (ref 0.30–0.70)

## 2020-03-13 LAB — TROPONIN I (HIGH SENSITIVITY)
Troponin I (High Sensitivity): 118 ng/L (ref <18)
Troponin I (High Sensitivity): 149 ng/L (ref <18)

## 2020-03-13 LAB — HIV ANTIBODY (ROUTINE TESTING W REFLEX): HIV Screen 4th Generation wRfx: NONREACTIVE

## 2020-03-13 LAB — BRAIN NATRIURETIC PEPTIDE: B Natriuretic Peptide: 322.4 pg/mL — ABNORMAL HIGH (ref 0.0–100.0)

## 2020-03-13 MED ORDER — SODIUM CHLORIDE 0.9 % IV SOLN: 250.0000 mL | INTRAVENOUS | Status: DC | PRN

## 2020-03-13 MED ORDER — ASPIRIN 81 MG PO CHEW
81.0000 mg | CHEWABLE_TABLET | ORAL | Status: AC
  Administered 2020-03-14: 81 mg via ORAL
  Filled 2020-03-13: qty 1

## 2020-03-13 MED ORDER — SODIUM CHLORIDE 0.9% FLUSH
3.0000 mL | Freq: Two times a day (BID) | INTRAVENOUS | Status: DC
  Administered 2020-03-13 – 2020-03-17 (×5): 3 mL via INTRAVENOUS

## 2020-03-13 MED ORDER — SODIUM CHLORIDE 0.9 % WEIGHT BASED INFUSION
1.0000 mL/kg/h | INTRAVENOUS | Status: DC
  Administered 2020-03-13 – 2020-03-14 (×2): 1 mL/kg/h via INTRAVENOUS

## 2020-03-13 MED ORDER — SODIUM CHLORIDE 0.9% FLUSH: 3.0000 mL | INTRAVENOUS | Status: DC | PRN

## 2020-03-13 NOTE — Progress Notes (Signed)
Progress Note  Patient Name: Eugene [name] Date of Encounter: 03/13/2020  [facility] Cardiologist: [facility]  Subjective   Feels well this am. Chest pain has completely resolved. No dyspnea at rest.  Inpatient Medications    Scheduled Meds: . allopurinol  100 mg Oral Daily  . aspirin  81 mg Oral QHS  . glimepiride  1 mg Oral Q breakfast  . insulin detemir  60 Units Subcutaneous QHS  . isosorbide mononitrate  60 mg Oral Daily  . metoprolol tartrate  25 mg Oral BID  . sodium chloride flush  3 mL Intravenous Q12H   Continuous Infusions: . heparin 1,600 Units/hr (03/13/20 0404)  . nitroGLYCERIN 10 mcg/min (03/13/20 0845)   PRN Meds: acetaminophen, ALPRAZolam, ondansetron (ZOFRAN) IV   Vital Signs    Vitals:   03/13/20 0705 03/13/20 0800 03/13/20 0810 03/13/20 0842  BP: 119/65  107/77 123/60  Pulse: 82  89 89  Resp: 18 18 17 19   Temp: 98.4 F (36.9 C)  98.8 F (37.1 C)   TempSrc: Oral  Oral   SpO2: 92% (!) 88% 92% 98%  Weight:      Height:        Intake/Output Summary (Last 24 hours) at 03/13/2020 1009 Last data filed at 03/13/2020 0404 Gross per 24 hour  Intake 1102.52 ml  Output --  Net 1102.52 ml   Last 3 Weights 03/13/2020 03/12/2020 03/12/2020  Weight (lbs) 256 lb 6.3 oz 259 lb 11.2 oz 255 lb  Weight (kg) 116.3 kg 117.8 kg 115.667 kg      Telemetry    NSR3 - Personally Reviewed  ECG    NSR. Old anterior infarct. No acute change - Personally Reviewed  Physical Exam   GEN: obese,  No acute distress.   Neck: No JVD Cardiac: RRR, no murmurs, rubs, or gallops.  Respiratory: Clear to auscultation bilaterally. GI: Soft, nontender, non-distended  MS: 1+ edema; No deformity. Neuro:  Nonfocal  Psych: Normal affect   Labs    High Sensitivity Troponin:   Recent Labs  Lab 03/12/20 0257 03/12/20 0537 03/13/20 0001 03/13/20 0645  TROPONINIHS 148* 144* 118* 149*      Chemistry Recent Labs  Lab 03/12/20 0257 03/13/20 0001   NA 135 137  K 5.2* 5.1  CL 101 102  CO2 25 25  GLUCOSE 256* 184*  BUN 45* 40*  CREATININE 2.04* 2.01*  CALCIUM 9.0 8.8*  GFRNONAA 34* 34*  GFRAA 39* 40*  ANIONGAP 9 10     Hematology Recent Labs  Lab 03/12/20 0257  WBC 5.5  RBC 3.88*  HGB 11.2*  HCT 35.2*  MCV 90.7  MCH 28.9  MCHC 31.8  RDW 15.9*  PLT 159    BNP Recent Labs  Lab 03/12/20 0300 03/13/20 0001  BNP 248.0* 322.4*     DDimer No results for input(s): DDIMER in the last 168 hours.   Radiology    DG Chest Portable 1 View  Result Date: 03/12/2020 CLINICAL DATA:  Chest pain x2 days. EXAM: PORTABLE CHEST 1 VIEW COMPARISON:  February 22, 2019 FINDINGS: Mild, diffusely increased interstitial lung markings are seen. There is no evidence of focal consolidation, pleural effusion or pneumothorax. The cardiac silhouette is markedly enlarged and unchanged in size. The visualized skeletal structures are unremarkable. IMPRESSION: 1. Mild, diffusely increased interstitial lung markings, likely consistent with mild interstitial edema. 2. Stable cardiomegaly. Electronically Signed   By: Virgina Norfolk M.D.   On: 03/12/2020 03:56    Cardiac  Studies   Echo 10/29/19:IMPRESSIONS    1. Left ventricular ejection fraction, by estimation, is 45 to 50%. The  left ventricle has mildly decreased function. There is severe left  ventricular hypertrophy of the posterior segment.  2. Right ventricular systolic function is normal. The right ventricular  size is normal.  3. Left atrial size was severely dilated.  4. Moderate pericardial effusion. The pericardial effusion is  circumferential. There is no evidence of cardiac tamponade.  5. The aortic valve was not well visualized.   Comparison(s): Echocardiogram done 02/22/19 showed an EF of 50% with a  moderate circumferencial pericardial effusion.    Patient Profile     64 y.o. male with a history of CAD (PCIs in 2012 and 2014), chronic diastolic CHF, COPD, type 2 diabetes  mellitus, CKD stage III, GERD, gout, hypertension, hyperlipidemia, recurrent pericardial effusions (s/p redo window 09/2018), peripheral neuropathy and sleep apnea, who presented to the hospital with complaints of chest pain.  Assessment & Plan    1. NSTEMI. Patient presented with 4 day history of chest pain. Pain now resolved with IV Ntg and oxygen. Troponin levels mildly increased and declining. I suspect this reflects late presentation. Ecg with old septal infarct. No acute changes. Patient has known CAD with last cath in 2014 during acute infarct. He has multiple cardiac risk factors. Recommend invasive evaluation with cardiac cath this admit. Will hold Torsemide and losartan for cath. Hydrate overnight for CKD. Anticipate LHC with possible PCI tomorrow if renal function is stable. Patient did have contrast induced nephropathy in 2014 but this was after emergent intervention.  2. Recurrent pericardial effusion s/p pericardial window x 2 in 2014 and 2020. Last Echo in March showed moderate effusion. Will update Echo 3. CKD stage 3b 4. Morbid obesity.  5. HLD intolerant to statins. Has seen Dr Debara Pickett in lipid clinic. Considering PCSK 9 if can get insurance approval. 6. DM with peripheral neuropathy. 7. HTN 8. OSA 9. Chronic combined systolic/diastolic CHF. Last EF 45-50%. Losartan and torsemide on hold for cath.    For questions or updates, please contact [place] Please consult [email or web address] for contact info under        Signed, Ileen Kahre [place], MD  03/13/2020, 10:09 AM

## 2020-03-13 NOTE — Progress Notes (Signed)
Pt is stable, consent has taken for a cath procedure tomorrow, patient aware of it and NPO since midnight, Heparin drip continue at 17, Nitro is titrated and running at 5 mcg now, vitals been maintained, denies chest pain, SOB and distress, will continue to monitor the patient  Palma Holter, RN

## 2020-03-13 NOTE — Plan of Care (Signed)

## 2020-03-13 NOTE — Progress Notes (Signed)
ANTICOAGULATION CONSULT NOTE - Follow-up Consult  Pharmacy Consult for Heparin Indication: chest pain/ACS  Allergies  Allergen Reactions  . Hydrocodone Nausea And Vomiting  . Lisinopril Cough  . Neurontin [Gabapentin] Other (See Comments)    Reaction:  Suicidal thoughts   . Statins Other (See Comments)    Reaction:  Muscle pain   . Metformin And Related Diarrhea  . Norvasc [Amlodipine Besylate] Swelling and Other (See Comments)    Reaction:  Pedal edema    Patient Measurements: Height: 6\' 1"  (185.4 cm) Weight: 116.3 kg (256 lb 6.3 oz) IBW/kg (Calculated) : 79.9 Heparin Dosing Weight: 105 kg  Vital Signs: Temp: 98.8 F (37.1 C) (07/18 0810) Temp Source: Oral (07/18 0810) BP: 123/60 (07/18 0842) Pulse Rate: 89 (07/18 0842)  Labs: Recent Labs    03/12/20 0257 03/12/20 0257 03/12/20 0537 03/12/20 1208 03/12/20 1737 03/13/20 0001 03/13/20 0645 03/13/20 0756  HGB 11.2*  --   --   --   --   --   --   --   HCT 35.2*  --   --   --   --   --   --   --   PLT 159  --   --   --   --   --   --   --   LABPROT  --   --   --   --   --  13.7  --   --   INR  --   --   --   --   --  1.1  --   --   HEPARINUNFRC  --   --   --  0.27* 0.31  --   --  0.29*  CREATININE 2.04*  --   --   --   --  2.01*  --   --   TROPONINIHS 148*   < > 144*  --   --  118* 149*  --    < > = values in this interval not displayed.    Estimated Creatinine Clearance: 50.3 mL/min (A) (by C-G formula based on SCr of 2.01 mg/dL (H)).   Assessment: 64 y.o. M presents with CP. To begin heparin for ACS. No AC PTA. CBC ok at baseline.  Heparin level (0.29) slightly subtherapeutic on drip rate 1600 units/hr. Hgb 11.2, plts wnl on last check 7/17. No overt bleeding or infusion issues. Will increase drip rate slightly to achieve therapeutic level and recheck HL with AM labs.   Goal of Therapy:  Heparin level 0.3-0.7 units/ml Monitor platelets by anticoagulation protocol: Yes   Plan:  Increase heparin infusion  to 1700 units/hr Re-check heparin level with am labs Daily heparin level and CBC Monitor for s/s of bleeding.  Richardine Service, PharmD PGY2 Cardiology Pharmacy Resident Phone: [phone] 03/13/2020  10:37 AM  Please check [email or web address] for unit-specific pharmacy phone numbers.

## 2020-03-13 NOTE — Progress Notes (Signed)
CRITICAL VALUE ALERT  Critical Value:  Troponin I- 118 (previously 144)  Date & Time Notied:  03/13/2020 @ 0115  Provider Notified: Dr. Paticia Stack  Orders Received/Actions taken: No new orders received. Patient does not have any CP. BP127/80 HR: 79 Will continue to monitor the patient.

## 2020-03-14 ENCOUNTER — Encounter (HOSPITAL_COMMUNITY)
Admission: EM | Disposition: A | Discharge status: Home / Self Care | Payer: Self-pay | Source: Home / Self Care | Attending: Cardiology

## 2020-03-14 ENCOUNTER — Other Ambulatory Visit: Payer: Self-pay

## 2020-03-14 ENCOUNTER — Inpatient Hospital Stay (HOSPITAL_COMMUNITY): Payer: Medicare HMO

## 2020-03-14 ENCOUNTER — Encounter (INDEPENDENT_AMBULATORY_CARE_PROVIDER_SITE_OTHER): Payer: Medicare HMO | Admitting: Ophthalmology

## 2020-03-14 ENCOUNTER — Encounter (HOSPITAL_COMMUNITY): Payer: Self-pay | Admitting: Cardiovascular Disease

## 2020-03-14 DIAGNOSIS — I2510 Atherosclerotic heart disease of native coronary artery without angina pectoris: Secondary | ICD-10-CM

## 2020-03-14 DIAGNOSIS — I214 Non-ST elevation (NSTEMI) myocardial infarction: Secondary | ICD-10-CM | POA: Diagnosis not present

## 2020-03-14 DIAGNOSIS — E7800 Pure hypercholesterolemia, unspecified: Secondary | ICD-10-CM

## 2020-03-14 DIAGNOSIS — I10 Essential (primary) hypertension: Secondary | ICD-10-CM | POA: Diagnosis not present

## 2020-03-14 DIAGNOSIS — R079 Chest pain, unspecified: Secondary | ICD-10-CM

## 2020-03-14 HISTORY — PX: LEFT HEART CATH AND CORONARY ANGIOGRAPHY: CATH118249

## 2020-03-14 LAB — BASIC METABOLIC PANEL
Anion gap: 9 (ref 5–15)
BUN: 44 mg/dL — ABNORMAL HIGH (ref 8–23)
CO2: 25 mmol/L (ref 22–32)
Calcium: 8.5 mg/dL — ABNORMAL LOW (ref 8.9–10.3)
Chloride: 103 mmol/L (ref 98–111)
Creatinine, Ser: 1.8 mg/dL — ABNORMAL HIGH (ref 0.61–1.24)
GFR calc Af Amer: 45 mL/min — ABNORMAL LOW (ref >60)
GFR calc non Af Amer: 39 mL/min — ABNORMAL LOW (ref >60)
Glucose, Bld: 90 mg/dL (ref 70–99)
Potassium: 4.7 mmol/L (ref 3.5–5.1)
Sodium: 137 mmol/L (ref 135–145)

## 2020-03-14 LAB — CBC
HCT: 33.4 % — ABNORMAL LOW (ref 39.0–52.0)
Hemoglobin: 10.3 g/dL — ABNORMAL LOW (ref 13.0–17.0)
MCH: 28.2 pg (ref 26.0–34.0)
MCHC: 30.8 g/dL (ref 30.0–36.0)
MCV: 91.5 fL (ref 80.0–100.0)
Platelets: 157 10*3/uL (ref 150–400)
RBC: 3.65 MIL/uL — ABNORMAL LOW (ref 4.22–5.81)
RDW: 15.7 % — ABNORMAL HIGH (ref 11.5–15.5)
WBC: 4.7 10*3/uL (ref 4.0–10.5)
nRBC: 0 % (ref 0.0–0.2)

## 2020-03-14 LAB — ECHOCARDIOGRAM COMPLETE
Area-P 1/2: 7.99 cm2
Height: 73 in
S' Lateral: 3.2 cm
Weight: 4141.12 oz

## 2020-03-14 LAB — GLUCOSE, CAPILLARY: Glucose-Capillary: 79 mg/dL (ref 70–99)

## 2020-03-14 LAB — HEPARIN LEVEL (UNFRACTIONATED): Heparin Unfractionated: 0.21 IU/mL — ABNORMAL LOW (ref 0.30–0.70)

## 2020-03-14 SURGERY — LEFT HEART CATH AND CORONARY ANGIOGRAPHY
Anesthesia: LOCAL

## 2020-03-14 MED ORDER — LIDOCAINE HCL (PF) 1 % IJ SOLN
INTRAMUSCULAR | Status: DC | PRN
  Administered 2020-03-14: 2 mL

## 2020-03-14 MED ORDER — HEPARIN (PORCINE) IN NACL 1000-0.9 UT/500ML-% IV SOLN
INTRAVENOUS | Status: AC
  Filled 2020-03-14: qty 500

## 2020-03-14 MED ORDER — ONDANSETRON HCL 4 MG/2ML IJ SOLN: 4.0000 mg | Freq: Four times a day (QID) | INTRAMUSCULAR | Status: DC | PRN

## 2020-03-14 MED ORDER — IOHEXOL 350 MG/ML SOLN
INTRAVENOUS | Status: DC | PRN
  Administered 2020-03-14: 55 mL

## 2020-03-14 MED ORDER — MIDAZOLAM HCL 2 MG/2ML IJ SOLN
INTRAMUSCULAR | Status: AC
  Filled 2020-03-14: qty 2

## 2020-03-14 MED ORDER — HEPARIN SODIUM (PORCINE) 1000 UNIT/ML IJ SOLN
INTRAMUSCULAR | Status: DC | PRN
  Administered 2020-03-14: 6000 [IU] via INTRAVENOUS

## 2020-03-14 MED ORDER — LIDOCAINE HCL (PF) 1 % IJ SOLN
INTRAMUSCULAR | Status: AC
  Filled 2020-03-14: qty 30

## 2020-03-14 MED ORDER — VERAPAMIL HCL 2.5 MG/ML IV SOLN
INTRAVENOUS | Status: AC
  Filled 2020-03-14: qty 6

## 2020-03-14 MED ORDER — ASPIRIN 81 MG PO CHEW
81.0000 mg | CHEWABLE_TABLET | Freq: Every day | ORAL | Status: DC
  Administered 2020-03-15 – 2020-03-18 (×4): 81 mg via ORAL
  Filled 2020-03-14 (×4): qty 1

## 2020-03-14 MED ORDER — NITROGLYCERIN 1 MG/10 ML FOR IR/CATH LAB
INTRA_ARTERIAL | Status: AC
  Filled 2020-03-14: qty 10

## 2020-03-14 MED ORDER — LABETALOL HCL 5 MG/ML IV SOLN: 10.0000 mg | INTRAVENOUS | Status: AC | PRN

## 2020-03-14 MED ORDER — ACETAMINOPHEN 325 MG PO TABS
650.0000 mg | ORAL_TABLET | ORAL | Status: DC | PRN
  Administered 2020-03-16 – 2020-03-18 (×4): 650 mg via ORAL
  Filled 2020-03-14 (×6): qty 2

## 2020-03-14 MED ORDER — VERAPAMIL HCL 2.5 MG/ML IV SOLN: INTRA_ARTERIAL | Status: DC | PRN

## 2020-03-14 MED ORDER — GLIMEPIRIDE 1 MG PO TABS
1.0000 mg | ORAL_TABLET | Freq: Every day | ORAL | Status: DC
  Administered 2020-03-15 – 2020-03-18 (×4): 1 mg via ORAL
  Filled 2020-03-14 (×4): qty 1

## 2020-03-14 MED ORDER — SODIUM CHLORIDE 0.9% FLUSH
3.0000 mL | Freq: Two times a day (BID) | INTRAVENOUS | Status: DC
  Administered 2020-03-15 – 2020-03-18 (×7): 3 mL via INTRAVENOUS

## 2020-03-14 MED ORDER — FENTANYL CITRATE (PF) 100 MCG/2ML IJ SOLN
INTRAMUSCULAR | Status: DC | PRN
  Administered 2020-03-14: 25 ug via INTRAVENOUS

## 2020-03-14 MED ORDER — SODIUM CHLORIDE 0.9% FLUSH: 3.0000 mL | INTRAVENOUS | Status: DC | PRN

## 2020-03-14 MED ORDER — FENTANYL CITRATE (PF) 100 MCG/2ML IJ SOLN
INTRAMUSCULAR | Status: AC
  Filled 2020-03-14: qty 2

## 2020-03-14 MED ORDER — MIDAZOLAM HCL 2 MG/2ML IJ SOLN
INTRAMUSCULAR | Status: DC | PRN
  Administered 2020-03-14: 1 mg via INTRAVENOUS

## 2020-03-14 MED ORDER — HEPARIN (PORCINE) IN NACL 1000-0.9 UT/500ML-% IV SOLN
INTRAVENOUS | Status: DC | PRN
  Administered 2020-03-14 (×2): 500 mL

## 2020-03-14 MED ORDER — VERAPAMIL HCL 2.5 MG/ML IV SOLN: INTRAVENOUS | Status: DC | PRN

## 2020-03-14 MED ORDER — HEPARIN SODIUM (PORCINE) 1000 UNIT/ML IJ SOLN
INTRAMUSCULAR | Status: AC
  Filled 2020-03-14: qty 1

## 2020-03-14 SURGICAL SUPPLY — 10 items
CATH OPTITORQUE TIG 4.0 5F (CATHETERS) ×1 IMPLANT
DEVICE RAD COMP TR BAND LRG (VASCULAR PRODUCTS) ×1 IMPLANT
GLIDESHEATH SLEND A-KIT 6F 22G (SHEATH) ×1 IMPLANT
GUIDEWIRE INQWIRE 1.5J.035X260 (WIRE) IMPLANT
INQWIRE 1.5J .035X260CM (WIRE) ×2
KIT HEART LEFT (KITS) ×2 IMPLANT
PACK CARDIAC CATHETERIZATION (CUSTOM PROCEDURE TRAY) ×2 IMPLANT
TRANSDUCER W/STOPCOCK (MISCELLANEOUS) ×2 IMPLANT
TUBING CIL FLEX 10 FLL-RA (TUBING) ×2 IMPLANT
WIRE HI TORQ VERSACORE-J 145CM (WIRE) ×1 IMPLANT

## 2020-03-14 NOTE — Progress Notes (Signed)
  Echocardiogram 2D Echocardiogram has been performed.  Eugene [name] 03/14/2020, 10:10 AM

## 2020-03-14 NOTE — Plan of Care (Signed)

## 2020-03-14 NOTE — Patient Outreach (Signed)
[facility]) Care Management  03/14/2020  Tymier Lindholm [place] Mar 22, 1956 [id number]   Telephone Screen  Referral Date:03/14/2020 Referral Source: Nurse Line Call-03/12/2020 Referral Reason: "64 yr old male called [facility] Triage line c/o of chest tightness for the past couple of days. Pt c/o neck and shoulder pain, took 4 baby aspirin and nitro SL x1 at 1:00 am. Pt hx has of COPD, CHF, HTN, MI x 2 in the past. Pt denies dizziness, confusion, cool/clammy, or change in color. Pt alert and orientated x 3, follow verbal commands, and no distress noted on call . Pt BP taken while on call BP 171/98 HR 98. Pt report taking all prescribed medication as directed. Pt stated SL NTG has not relieved pain and needs to go to hospital. Wife called 911 pt will be going to nearest hospital in [place]."  Insurance: [facility]   Referral received. Upon chart review noted that patient currently inpatient related to call to nurse line.      Plan: RN CM will close case at this time.   Enzo Montgomery, RN,BSN,CCM [place] Management Telephonic Care Management Coordinator Direct Phone: [phone] Toll Free: [phone] Fax: [phone]

## 2020-03-14 NOTE — H&P (View-Only) (Signed)
Progress Note  Patient Name: Eugene [name] Date of Encounter: 03/14/2020  [facility] Cardiologist: Kate Sable, MD   Subjective   Feeling OK.  3/10 chest pain now.  No shortness of breath.   Inpatient Medications    Scheduled Meds: . allopurinol  100 mg Oral Daily  . aspirin  81 mg Oral QHS  . [START ON 03/15/2020] glimepiride  1 mg Oral Q breakfast  . insulin detemir  60 Units Subcutaneous QHS  . isosorbide mononitrate  60 mg Oral Daily  . metoprolol tartrate  25 mg Oral BID  . sodium chloride flush  3 mL Intravenous Q12H   Continuous Infusions: . sodium chloride    . sodium chloride 1 mL/kg/hr (03/14/20 0501)  . heparin 1,900 Units/hr (03/14/20 0457)  . nitroGLYCERIN 5 mcg/min (03/13/20 2326)   PRN Meds: sodium chloride, acetaminophen, ALPRAZolam, ondansetron (ZOFRAN) IV, sodium chloride flush   Vital Signs    Vitals:   03/13/20 2119 03/13/20 2326 03/14/20 0329 03/14/20 0724  BP: 138/60 110/63 121/78 135/82  Pulse: 79 70 68 80  Resp:  20 (!) 21 19  Temp:  98.6 F (37 C) 98.1 F (36.7 C) 98 F (36.7 C)  TempSrc:  Oral Oral Oral  SpO2:  96% 98% 96%  Weight:   117.4 kg   Height:        Intake/Output Summary (Last 24 hours) at 03/14/2020 0821 Last data filed at 03/14/2020 0330 Gross per 24 hour  Intake 1484.1 ml  Output 1600 ml  Net -115.9 ml   Last 3 Weights 03/14/2020 03/13/2020 03/12/2020  Weight (lbs) 258 lb 13.1 oz 256 lb 6.3 oz 259 lb 11.2 oz  Weight (kg) 117.4 kg 116.3 kg 117.8 kg      Telemetry    Sinus rhythm.  No events. - Personally Reviewed  ECG    03/14/20: Sinus rhythm.  Rate 80 bpm.  First degree AV block. LAD.  Low voltage.  IRBBB.  Cannot rule out anteroseptal infarct - Personally Reviewed  Physical Exam   VS:  BP 135/82 (BP Location: Left Arm)   Pulse 80   Temp 98 F (36.7 C) (Oral)   Resp 19   Ht 6\' 1"  (1.854 m)   Wt 117.4 kg   SpO2 96%   BMI 34.15 kg/m  , BMI Body mass index is 34.15 kg/m. GENERAL:  Well  appearing HEENT: Pupils equal round and reactive, fundi not visualized, oral mucosa unremarkable NECK:  No jugular venous distention, waveform within normal limits, carotid upstroke brisk and symmetric, no bruits LUNGS:  Clear to auscultation bilaterally HEART:  RRR.  PMI not displaced or sustained,S1 and S2 within normal limits, no S3, no S4, no clicks, no rubs, no murmurs ABD:  Flat, positive bowel sounds normal in frequency in pitch, no bruits, no rebound, no guarding, no midline pulsatile mass, no hepatomegaly, no splenomegaly EXT:  2 plus pulses throughout, no edema, no cyanosis no clubbing SKIN:  No rashes no nodules NEURO:  Cranial nerves II through XII grossly intact, motor grossly intact throughout PSYCH:  Cognitively intact, oriented to person place and time   Labs    High Sensitivity Troponin:   Recent Labs  Lab 03/12/20 0257 03/12/20 0537 03/13/20 0001 03/13/20 0645  TROPONINIHS 148* 144* 118* 149*      Chemistry Recent Labs  Lab 03/12/20 0257 03/13/20 0001 03/14/20 0248  NA 135 137 137  K 5.2* 5.1 4.7  CL 101 102 103  CO2 25 25 25  GLUCOSE 256* 184* 90  BUN 45* 40* 44*  CREATININE 2.04* 2.01* 1.80*  CALCIUM 9.0 8.8* 8.5*  GFRNONAA 34* 34* 39*  GFRAA 39* 40* 45*  ANIONGAP 9 10 9      Hematology Recent Labs  Lab 03/12/20 0257 03/14/20 0248  WBC 5.5 4.7  RBC 3.88* 3.65*  HGB 11.2* 10.3*  HCT 35.2* 33.4*  MCV 90.7 91.5  MCH 28.9 28.2  MCHC 31.8 30.8  RDW 15.9* 15.7*  PLT 159 157    BNP Recent Labs  Lab 03/12/20 0300 03/13/20 0001  BNP 248.0* 322.4*     DDimer No results for input(s): DDIMER in the last 168 hours.   Radiology    No results found.  Cardiac Studies   Echo 10/29/19:  1. Left ventricular ejection fraction, by estimation, is 45 to 50%. The  left ventricle has mildly decreased function. There is severe left  ventricular hypertrophy of the posterior segment.  2. Right ventricular systolic function is normal. The right  ventricular  size is normal.  3. Left atrial size was severely dilated.  4. Moderate pericardial effusion. The pericardial effusion is  circumferential. There is no evidence of cardiac tamponade.  5. The aortic valve was not well visualized.   Patient Profile     64 y.o. male with CAD s/p PCI 2012 and 4627, chronic diastolic heart failure, recurrent pericardial effusion s/p pericardial window x2, GERD, HTN, LH, CKD III, and OSA admitted with NSTEMI.  Assessment & Plan    # NSTEMI:  # HL:  Patient presented with 4 days of CP.  HS troponin 144-->118-->149.  Renal function improving.  He continues to have chest pain today.  Plan for LHC today.  Intolerant of statins.  Has been seen by lipid clinic.  Considering PCSK9 inhibitor.  # Recurrent pericardial effusions: Echo pending.  Moderate on echo 10/2019.  # CKD 3:  Renal function improving.   # Morbid obesity:   For questions or updates, please contact [place] Please consult [email or web address] for contact info under        Signed, Skeet Latch, MD  03/14/2020, 8:21 AM

## 2020-03-14 NOTE — Progress Notes (Signed)
ANTICOAGULATION CONSULT NOTE - Follow-up Consult  Pharmacy Consult for Heparin Indication: chest pain/ACS  Allergies  Allergen Reactions  . Hydrocodone Nausea And Vomiting  . Lisinopril Cough  . Neurontin [Gabapentin] Other (See Comments)    Reaction:  Suicidal thoughts   . Statins Other (See Comments)    Reaction:  Muscle pain   . Metformin And Related Diarrhea  . Norvasc [Amlodipine Besylate] Swelling and Other (See Comments)    Reaction:  Pedal edema    Patient Measurements: Height: 6\' 1"  (185.4 cm) Weight: 116.3 kg (256 lb 6.3 oz) IBW/kg (Calculated) : 79.9 Heparin Dosing Weight: 105 kg  Vital Signs: Temp: 98.1 F (36.7 C) (07/19 0329) Temp Source: Oral (07/19 0329) BP: 121/78 (07/19 0329) Pulse Rate: 68 (07/19 0329)  Labs: Recent Labs    03/12/20 0257 03/12/20 0257 03/12/20 0537 03/12/20 1208 03/12/20 1737 03/13/20 0001 03/13/20 0645 03/13/20 0756 03/14/20 0248  HGB 11.2*  --   --   --   --   --   --   --  10.3*  HCT 35.2*  --   --   --   --   --   --   --  33.4*  PLT 159  --   --   --   --   --   --   --  157  LABPROT  --   --   --   --   --  13.7  --   --   --   INR  --   --   --   --   --  1.1  --   --   --   HEPARINUNFRC  --   --   --    < > 0.31  --   --  0.29* 0.21*  CREATININE 2.04*  --   --   --   --  2.01*  --   --  1.80*  TROPONINIHS 148*   < > 144*  --   --  118* 149*  --   --    < > = values in this interval not displayed.    Estimated Creatinine Clearance: 56.1 mL/min (A) (by C-G formula based on SCr of 1.8 mg/dL (H)).   Assessment: 64 y.o. M presents with CP. To begin heparin for ACS. No AC PTA. CBC ok at baseline.  Heparin level down to 0.21 (subtherapeutic) on gtt at 1700 units/hr. No issues with line or bleeding reported per RN. Pt going to cath today - not on schedule yet.  Goal of Therapy:  Heparin level 0.3-0.7 units/ml Monitor platelets by anticoagulation protocol: Yes   Plan:  Increase heparin infusion to 1900 units/hr Will  f/u post cath  Sherlon Handing, PharmD, BCPS Please see amion for complete clinical pharmacist phone list 03/14/2020  4:53 AM

## 2020-03-14 NOTE — Progress Notes (Signed)
Progress Note  Patient Name: Eugene [name] Date of Encounter: 03/14/2020  [facility] Cardiologist: Kate Sable, MD   Subjective   Feeling OK.  3/10 chest pain now.  No shortness of breath.   Inpatient Medications    Scheduled Meds: . allopurinol  100 mg Oral Daily  . aspirin  81 mg Oral QHS  . [START ON 03/15/2020] glimepiride  1 mg Oral Q breakfast  . insulin detemir  60 Units Subcutaneous QHS  . isosorbide mononitrate  60 mg Oral Daily  . metoprolol tartrate  25 mg Oral BID  . sodium chloride flush  3 mL Intravenous Q12H   Continuous Infusions: . sodium chloride    . sodium chloride 1 mL/kg/hr (03/14/20 0501)  . heparin 1,900 Units/hr (03/14/20 0457)  . nitroGLYCERIN 5 mcg/min (03/13/20 2326)   PRN Meds: sodium chloride, acetaminophen, ALPRAZolam, ondansetron (ZOFRAN) IV, sodium chloride flush   Vital Signs    Vitals:   03/13/20 2119 03/13/20 2326 03/14/20 0329 03/14/20 0724  BP: 138/60 110/63 121/78 135/82  Pulse: 79 70 68 80  Resp:  20 (!) 21 19  Temp:  98.6 F (37 C) 98.1 F (36.7 C) 98 F (36.7 C)  TempSrc:  Oral Oral Oral  SpO2:  96% 98% 96%  Weight:   117.4 kg   Height:        Intake/Output Summary (Last 24 hours) at 03/14/2020 0821 Last data filed at 03/14/2020 0330 Gross per 24 hour  Intake 1484.1 ml  Output 1600 ml  Net -115.9 ml   Last 3 Weights 03/14/2020 03/13/2020 03/12/2020  Weight (lbs) 258 lb 13.1 oz 256 lb 6.3 oz 259 lb 11.2 oz  Weight (kg) 117.4 kg 116.3 kg 117.8 kg      Telemetry    Sinus rhythm.  No events. - Personally Reviewed  ECG    03/14/20: Sinus rhythm.  Rate 80 bpm.  First degree AV block. LAD.  Low voltage.  IRBBB.  Cannot rule out anteroseptal infarct - Personally Reviewed  Physical Exam   VS:  BP 135/82 (BP Location: Left Arm)   Pulse 80   Temp 98 F (36.7 C) (Oral)   Resp 19   Ht 6\' 1"  (1.854 m)   Wt 117.4 kg   SpO2 96%   BMI 34.15 kg/m  , BMI Body mass index is 34.15 kg/m. GENERAL:  Well  appearing HEENT: Pupils equal round and reactive, fundi not visualized, oral mucosa unremarkable NECK:  No jugular venous distention, waveform within normal limits, carotid upstroke brisk and symmetric, no bruits LUNGS:  Clear to auscultation bilaterally HEART:  RRR.  PMI not displaced or sustained,S1 and S2 within normal limits, no S3, no S4, no clicks, no rubs, no murmurs ABD:  Flat, positive bowel sounds normal in frequency in pitch, no bruits, no rebound, no guarding, no midline pulsatile mass, no hepatomegaly, no splenomegaly EXT:  2 plus pulses throughout, no edema, no cyanosis no clubbing SKIN:  No rashes no nodules NEURO:  Cranial nerves II through XII grossly intact, motor grossly intact throughout PSYCH:  Cognitively intact, oriented to person place and time   Labs    High Sensitivity Troponin:   Recent Labs  Lab 03/12/20 0257 03/12/20 0537 03/13/20 0001 03/13/20 0645  TROPONINIHS 148* 144* 118* 149*      Chemistry Recent Labs  Lab 03/12/20 0257 03/13/20 0001 03/14/20 0248  NA 135 137 137  K 5.2* 5.1 4.7  CL 101 102 103  CO2 25 25 25  GLUCOSE 256* 184* 90  BUN 45* 40* 44*  CREATININE 2.04* 2.01* 1.80*  CALCIUM 9.0 8.8* 8.5*  GFRNONAA 34* 34* 39*  GFRAA 39* 40* 45*  ANIONGAP 9 10 9      Hematology Recent Labs  Lab 03/12/20 0257 03/14/20 0248  WBC 5.5 4.7  RBC 3.88* 3.65*  HGB 11.2* 10.3*  HCT 35.2* 33.4*  MCV 90.7 91.5  MCH 28.9 28.2  MCHC 31.8 30.8  RDW 15.9* 15.7*  PLT 159 157    BNP Recent Labs  Lab 03/12/20 0300 03/13/20 0001  BNP 248.0* 322.4*     DDimer No results for input(s): DDIMER in the last 168 hours.   Radiology    No results found.  Cardiac Studies   Echo 10/29/19:  1. Left ventricular ejection fraction, by estimation, is 45 to 50%. The  left ventricle has mildly decreased function. There is severe left  ventricular hypertrophy of the posterior segment.  2. Right ventricular systolic function is normal. The right  ventricular  size is normal.  3. Left atrial size was severely dilated.  4. Moderate pericardial effusion. The pericardial effusion is  circumferential. There is no evidence of cardiac tamponade.  5. The aortic valve was not well visualized.   Patient Profile     64 y.o. male with CAD s/p PCI 2012 and 1916, chronic diastolic heart failure, recurrent pericardial effusion s/p pericardial window x2, GERD, HTN, LH, CKD III, and OSA admitted with NSTEMI.  Assessment & Plan    # NSTEMI:  # HL:  Patient presented with 4 days of CP.  HS troponin 144-->118-->149.  Renal function improving.  He continues to have chest pain today.  Plan for LHC today.  Intolerant of statins.  Has been seen by lipid clinic.  Considering PCSK9 inhibitor.  # Recurrent pericardial effusions: Echo pending.  Moderate on echo 10/2019.  # CKD 3:  Renal function improving.   # Morbid obesity:   For questions or updates, please contact [place] Please consult [email or web address] for contact info under        Signed, Skeet Latch, MD  03/14/2020, 8:21 AM

## 2020-03-14 NOTE — Interval H&P Note (Signed)
Cath Lab Visit (complete for each Cath Lab visit)  Clinical Evaluation Leading to the Procedure:   ACS: Yes.    Non-ACS:    Anginal Classification: CCS III  Anti-ischemic medical therapy: Minimal Therapy (1 class of medications)  Non-Invasive Test Results: No non-invasive testing performed  Prior CABG: No previous CABG      History and Physical Interval Note:  03/14/2020 10:00 AM  Eugene [name]  has presented today for surgery, with the diagnosis of NSTEMI.  The various methods of treatment have been discussed with the patient and family. After consideration of risks, benefits and other options for treatment, the patient has consented to  Procedure(s): LEFT HEART CATH AND CORONARY ANGIOGRAPHY (N/A) as a surgical intervention.  The patient's history has been reviewed, patient examined, no change in status, stable for surgery.  I have reviewed the patient's chart and labs.  Questions were answered to the patient's satisfaction.     Quay Burow

## 2020-03-15 ENCOUNTER — Telehealth: Payer: Self-pay | Admitting: Internal Medicine

## 2020-03-15 DIAGNOSIS — I214 Non-ST elevation (NSTEMI) myocardial infarction: Secondary | ICD-10-CM | POA: Diagnosis not present

## 2020-03-15 DIAGNOSIS — Z789 Other specified health status: Secondary | ICD-10-CM

## 2020-03-15 DIAGNOSIS — I5041 Acute combined systolic (congestive) and diastolic (congestive) heart failure: Secondary | ICD-10-CM | POA: Diagnosis not present

## 2020-03-15 DIAGNOSIS — E7800 Pure hypercholesterolemia, unspecified: Secondary | ICD-10-CM | POA: Diagnosis not present

## 2020-03-15 DIAGNOSIS — M10341 Gout due to renal impairment, right hand: Secondary | ICD-10-CM

## 2020-03-15 LAB — CBC
HCT: 33.4 % — ABNORMAL LOW (ref 39.0–52.0)
Hemoglobin: 10.2 g/dL — ABNORMAL LOW (ref 13.0–17.0)
MCH: 28.4 pg (ref 26.0–34.0)
MCHC: 30.5 g/dL (ref 30.0–36.0)
MCV: 93 fL (ref 80.0–100.0)
Platelets: 149 10*3/uL — ABNORMAL LOW (ref 150–400)
RBC: 3.59 MIL/uL — ABNORMAL LOW (ref 4.22–5.81)
RDW: 15.4 % (ref 11.5–15.5)
WBC: 5.7 10*3/uL (ref 4.0–10.5)
nRBC: 0 % (ref 0.0–0.2)

## 2020-03-15 LAB — BASIC METABOLIC PANEL
Anion gap: 8 (ref 5–15)
BUN: 40 mg/dL — ABNORMAL HIGH (ref 8–23)
CO2: 25 mmol/L (ref 22–32)
Calcium: 8.5 mg/dL — ABNORMAL LOW (ref 8.9–10.3)
Chloride: 104 mmol/L (ref 98–111)
Creatinine, Ser: 1.68 mg/dL — ABNORMAL HIGH (ref 0.61–1.24)
GFR calc Af Amer: 49 mL/min — ABNORMAL LOW (ref >60)
GFR calc non Af Amer: 43 mL/min — ABNORMAL LOW (ref >60)
Glucose, Bld: 183 mg/dL — ABNORMAL HIGH (ref 70–99)
Potassium: 5.4 mmol/L — ABNORMAL HIGH (ref 3.5–5.1)
Sodium: 137 mmol/L (ref 135–145)

## 2020-03-15 LAB — GLUCOSE, CAPILLARY: Glucose-Capillary: 131 mg/dL — ABNORMAL HIGH (ref 70–99)

## 2020-03-15 MED ORDER — HYDRALAZINE HCL 20 MG/ML IJ SOLN: 10.0000 mg | INTRAMUSCULAR | Status: AC | PRN

## 2020-03-15 MED ORDER — METOPROLOL SUCCINATE ER 50 MG PO TB24
50.0000 mg | ORAL_TABLET | Freq: Every day | ORAL | Status: DC
  Administered 2020-03-15 – 2020-03-18 (×4): 50 mg via ORAL
  Filled 2020-03-15 (×4): qty 1

## 2020-03-15 MED ORDER — FUROSEMIDE 10 MG/ML IJ SOLN
40.0000 mg | Freq: Two times a day (BID) | INTRAMUSCULAR | Status: AC
  Administered 2020-03-15 (×2): 40 mg via INTRAVENOUS
  Filled 2020-03-15 (×2): qty 4

## 2020-03-15 MED ORDER — SODIUM CHLORIDE 0.9 % IV SOLN
250.0000 mL | INTRAVENOUS | Status: DC | PRN
  Administered 2020-03-15: 250 mL via INTRAVENOUS

## 2020-03-15 MED ORDER — SODIUM CHLORIDE 0.9 % IV SOLN: INTRAVENOUS | Status: AC

## 2020-03-15 MED ORDER — COLCHICINE 0.6 MG PO TABS
0.6000 mg | ORAL_TABLET | Freq: Every day | ORAL | Status: DC
  Administered 2020-03-15 – 2020-03-18 (×4): 0.6 mg via ORAL
  Filled 2020-03-15 (×4): qty 1

## 2020-03-15 NOTE — Consult Note (Signed)
   [facility] Inpatient Consult   03/15/2020  [place] 1955-12-03 [id number]  [place] [facility] [ACO] Patient:  [employer] Medicare  Patient with a history in the [place] Management Program as a benefit of patient's [employer].Came by to speak with regarding the restart of [place] [facility] Management services. Patient was currently in nursing care. Chart review reveals patient had actually called the [facility] help line prior to admission. Will follow patient for progress and post transition of care needs..    Plan:  Patient will receive post hospital discharge call and will be evaluated for complex disease management if returning to community at transition.  Primary Care Provider: Judieth Keens, MD, this provider does the transition of care follow up.      Of note, [facility] Care Management services does not replace or interfere with any services that are arranged by inpatient case management or social work.  For additional questions or referrals please contact:    Natividad Brood, RN BSN [facility] Liaison  [phone] business mobile phone Toll free office [phone]  Fax number: [phone] [email or web address]  [email or web address]  .

## 2020-03-15 NOTE — Telephone Encounter (Signed)
PA for repatha submitted via CMM PA Case: [id number], Status: Approved, Coverage Starts on: 03/15/2020 12:00:00 AM, Coverage Ends on: 09/11/2020 12:00:00 AM

## 2020-03-15 NOTE — Progress Notes (Signed)
Progress Note  Patient Name: Jospeh Mangel Symonette Date of Encounter: 03/15/2020  [facility] Cardiologist: Kate Sable, MD (Inactive)   Subjective   Feeling well.  Wants to go home.  Denies chest pain or pressure.  He does not use oxygen at home.  Inpatient Medications    Scheduled Meds: . allopurinol  100 mg Oral Daily  . aspirin  81 mg Oral Daily  . glimepiride  1 mg Oral Q breakfast  . insulin detemir  60 Units Subcutaneous QHS  . isosorbide mononitrate  60 mg Oral Daily  . metoprolol tartrate  25 mg Oral BID  . sodium chloride flush  3 mL Intravenous Q12H  . sodium chloride flush  3 mL Intravenous Q12H   Continuous Infusions: . sodium chloride 250 mL (03/15/20 0832)  . nitroGLYCERIN 5 mcg/min (03/13/20 2326)   PRN Meds: sodium chloride, acetaminophen, acetaminophen, ALPRAZolam, ondansetron (ZOFRAN) IV, sodium chloride flush   Vital Signs    Vitals:   03/14/20 2349 03/15/20 0339 03/15/20 0439 03/15/20 0700  BP: 118/80   133/66  Pulse: 85 86  85  Resp: (!) 24 (!) 24  20  Temp: 98 F (36.7 C) 98.2 F (36.8 C)  98.2 F (36.8 C)  TempSrc: Oral Oral  Oral  SpO2: 92%   92%  Weight:   117.9 kg   Height:        Intake/Output Summary (Last 24 hours) at 03/15/2020 0835 Last data filed at 03/15/2020 0800 Gross per 24 hour  Intake 906 ml  Output 1800 ml  Net -894 ml   Last 3 Weights 03/15/2020 03/14/2020 03/13/2020  Weight (lbs) 259 lb 14.8 oz 258 lb 13.1 oz 256 lb 6.3 oz  Weight (kg) 117.9 kg 117.4 kg 116.3 kg      Telemetry    Sinus rhythm.  No events. - Personally Reviewed  ECG    03/14/20: Sinus rhythm.  Rate 80 bpm.  First degree AV block. LAD.  Low voltage.  IRBBB. Prior anteroseptal infarct - Personally Reviewed  Physical Exam   VS:  BP 133/66 (BP Location: Left Arm)   Pulse 85   Temp 98.2 F (36.8 C) (Oral)   Resp 20   Ht 6\' 1"  (1.854 m)   Wt 117.9 kg   SpO2 92%   BMI 34.29 kg/m  , BMI Body mass index is 34.29 kg/m. GENERAL:  Well  appearing HEENT: Pupils equal round and reactive, fundi not visualized, oral mucosa unremarkable NECK:  + jugular venous distention, waveform within normal limits, carotid upstroke brisk and symmetric, no bruits LUNGS:  Clear to auscultation bilaterally HEART:  RRR.  PMI not displaced or sustained,S1 and S2 within normal limits, no S3, no S4, no clicks, no rubs, no murmurs ABD:  Flat, positive bowel sounds normal in frequency in pitch, no bruits, no rebound, no guarding, no midline pulsatile mass, no hepatomegaly, no splenomegaly EXT:  2 plus pulses throughout, trace edema, no cyanosis no clubbing SKIN:  No rashes no nodules NEURO:  Cranial nerves II through XII grossly intact, motor grossly intact throughout PSYCH:  Cognitively intact, oriented to person place and time   Labs    High Sensitivity Troponin:   Recent Labs  Lab 03/12/20 0257 03/12/20 0537 03/13/20 0001 03/13/20 0645  TROPONINIHS 148* 144* 118* 149*      Chemistry Recent Labs  Lab 03/13/20 0001 03/14/20 0248 03/15/20 0234  NA 137 137 137  K 5.1 4.7 5.4*  CL 102 103 104  CO2 25  25 25  GLUCOSE 184* 90 183*  BUN 40* 44* 40*  CREATININE 2.01* 1.80* 1.68*  CALCIUM 8.8* 8.5* 8.5*  GFRNONAA 34* 39* 43*  GFRAA 40* 45* 49*  ANIONGAP 10 9 8      Hematology Recent Labs  Lab 03/12/20 0257 03/14/20 0248 03/15/20 0234  WBC 5.5 4.7 5.7  RBC 3.88* 3.65* 3.59*  HGB 11.2* 10.3* 10.2*  HCT 35.2* 33.4* 33.4*  MCV 90.7 91.5 93.0  MCH 28.9 28.2 28.4  MCHC 31.8 30.8 30.5  RDW 15.9* 15.7* 15.4  PLT 159 157 149*    BNP Recent Labs  Lab 03/12/20 0300 03/13/20 0001  BNP 248.0* 322.4*     DDimer No results for input(s): DDIMER in the last 168 hours.   Radiology    CARDIAC CATHETERIZATION  Result Date: 03/14/2020  Previously placed Mid RCA to Dist RCA stent (unknown type) is widely patent.  Prox Cx lesion is 50% stenosed.  Mid RCA lesion is 40% stenosed.  Mid LAD-1 lesion is 50% stenosed.  Mid LAD-2  lesion is 100% stenosed with 100% stenosed side branch in 2nd Sept.  Mid LAD-3 lesion is 100% stenosed.  Aiyden Lauderback Blackburn is a 64 y.o. male  [id number] LOCATION:  FACILITY: [facility] PHYSICIAN: Quay Burow, M.D. Dec 15, 1955 DATE OF PROCEDURE:  03/14/2020 DATE OF DISCHARGE: CARDIAC CATHETERIZATION History obtained from chart review.64 y.o. male with CAD s/p PCI 2012 and 5643, chronic diastolic heart failure, recurrent pericardial effusion s/p pericardial window x2, GERD, HTN, LH, CKD III, and OSA admitted with NSTEMI.   [name] has a total LAD in the midportion without collaterals.  He had ongoing chest pain for several days prior to admission suggesting that he had completed an out of hospital anterior STEMI.  He has Q waves across his precordium and troponins which are flat.  Given the fact that there were no collaterals I suspect that he has no viable myocardium beyond his occluded mid LAD and therefore no benefit would be derived from opening this diffusely diseased vessel.  2D echo is pending.  Recommend medical therapy.  If he has ongoing pain and an akinetic anterior wall we could consider doing a viability cardiac MRI.  Depending on his LV function, if his EF is less than 35% he may benefit from a LifeVest.  The sheath was removed and a TR band was placed on the right wrist to achieve patent hemostasis.  The patient left lab in stable condition. Quay Burow. MD, [facility] 03/14/2020 10:38 AM   ECHOCARDIOGRAM COMPLETE  Result Date: 03/14/2020    ECHOCARDIOGRAM REPORT   Patient Name:   NADEEM ROMANOSKI [facility] Date of Exam: 03/14/2020 Medical Rec #:  [id number]           Height:       73.0 in Accession #:    [id number]          Weight:       258.8 lb Date of Birth:  [birth year]           BSA:          2.401 m Patient Age:    64 years            BP:           135/82 mmHg Patient Gender: M                   HR:           80 bpm. Exam Location:  Inpatient Procedure: 2D  Echo, Cardiac Doppler and Color Doppler  Indications:    Chest pain  History:        Patient has prior history of Echocardiogram examinations, most                 recent 05/19/2020. CHF, Previous Myocardial Infarction,                 Pericardial Disease and CAD, Signs/Symptoms:Chest Pain; Risk                 Factors:Diabetes and Obesity.  Sonographer:    Dustin Flock Referring Phys: [id number] PETER M [place]  Sonographer Comments: Patient is morbidly obese. Image acquisition challenging due to patient body habitus. IMPRESSIONS  1. Left ventricular ejection fraction, by estimation, is 40 to 45%. The left ventricle has mildly decreased function. The left ventricle demonstrates regional wall motion abnormalities (see scoring diagram/findings for description). There is moderate concentric left ventricular hypertrophy. Left ventricular diastolic parameters are consistent with Grade III diastolic dysfunction (restrictive).  2. Right ventricular systolic function is normal. The right ventricular size is normal. There is mildly elevated pulmonary artery systolic pressure.  3. Left atrial size was moderately dilated.  4. Right atrial size was mildly dilated.  5. Largest dimension 3.4 cm posterior to RA without evidence of tamponade.. Large pericardial effusion. The pericardial effusion is circumferential. There is no evidence of cardiac tamponade.  6. The mitral valve is normal in structure. Trivial mitral valve regurgitation. No evidence of mitral stenosis.  7. The aortic valve is tricuspid. Aortic valve regurgitation is not visualized. No aortic stenosis is present.  8. The inferior vena cava is dilated in size with <50% respiratory variability, suggesting right atrial pressure of 15 mmHg. Comparison(s): Changes from prior study are noted. Conclusion(s)/Recommendation(s): While there was some mild hypokinesis of the mid to distal anterior wall before, it appears more pronounced on today's study. EF slightly reduced compared to prior. Effusion seen on prior  studies, no evidence of tamponade. FINDINGS  Left Ventricle: Left ventricular ejection fraction, by estimation, is 40 to 45%. The left ventricle has mildly decreased function. The left ventricle demonstrates regional wall motion abnormalities. The left ventricular internal cavity size was normal in size. There is moderate concentric left ventricular hypertrophy. Left ventricular diastolic parameters are consistent with Grade III diastolic dysfunction (restrictive).  LV Wall Scoring: The apex is akinetic. The mid and distal anterior wall, mid and distal anterior septum, apical lateral segment, mid anterolateral segment, mid inferoseptal segment, and apical inferior segment are hypokinetic. The inferior wall, posterior wall, basal anteroseptal segment, basal anterolateral segment, basal anterior segment, and basal inferoseptal segment are normal. Right Ventricle: The right ventricular size is normal. Right vetricular wall thickness was not assessed. Right ventricular systolic function is normal. There is mildly elevated pulmonary artery systolic pressure. The tricuspid regurgitant velocity is 2.67 m/s, and with an assumed right atrial pressure of 15 mmHg, the estimated right ventricular systolic pressure is 76.1 mmHg. Left Atrium: Left atrial size was moderately dilated. Right Atrium: Right atrial size was mildly dilated. Pericardium: Largest dimension 3.4 cm posterior to RA without evidence of tamponade. A large pericardial effusion is present. The pericardial effusion is circumferential. There is no evidence of cardiac tamponade. Mitral Valve: The mitral valve is normal in structure. Trivial mitral valve regurgitation. No evidence of mitral valve stenosis. Tricuspid Valve: The tricuspid valve is normal in structure. Tricuspid valve regurgitation is trivial. No evidence of tricuspid stenosis. Aortic Valve: The aortic valve is tricuspid.  Aortic valve regurgitation is not visualized. No aortic stenosis is present.  Pulmonic Valve: The pulmonic valve was not well visualized. Pulmonic valve regurgitation is not visualized. Aorta: The aortic root and ascending aorta are structurally normal, with no evidence of dilitation. Venous: The inferior vena cava is dilated in size with less than 50% respiratory variability, suggesting right atrial pressure of 15 mmHg. IAS/Shunts: The atrial septum is grossly normal.  LEFT VENTRICLE PLAX 2D LVIDd:         5.10 cm  Diastology LVIDs:         3.20 cm  LV e' lateral:   8.19 cm/s LV PW:         1.50 cm  LV E/e' lateral: 15.8 LV IVS:        1.80 cm  LV e' medial:    3.57 cm/s LVOT diam:     2.50 cm  LV E/e' medial:  36.1 LV SV:         96 LV SV Index:   40 LVOT Area:     4.91 cm  RIGHT VENTRICLE RV Basal diam:  3.60 cm RV S prime:     5.68 cm/s TAPSE (M-mode): 3.4 cm LEFT ATRIUM             Index       RIGHT ATRIUM           Index LA diam:        4.90 cm 2.04 cm/m  RA Area:     19.00 cm LA Vol (A2C):   92.3 ml 38.45 ml/m RA Volume:   55.10 ml  22.95 ml/m LA Vol (A4C):   95.2 ml 39.65 ml/m LA Biplane Vol: 96.6 ml 40.24 ml/m  AORTIC VALVE LVOT Vmax:   117.00 cm/s LVOT Vmean:  77.200 cm/s LVOT VTI:    0.195 m  AORTA Ao Root diam: 3.50 cm MITRAL VALVE                TRICUSPID VALVE MV Area (PHT): 7.99 cm     TR Peak grad:   28.5 mmHg MV Decel Time: 95 msec      TR Vmax:        267.00 cm/s MV E velocity: 129.00 cm/s MV A velocity: 55.10 cm/s   SHUNTS MV E/A ratio:  2.34         Systemic VTI:  0.20 m                             Systemic Diam: 2.50 cm Buford Dresser MD Electronically signed by Buford Dresser MD Signature Date/Time: 03/14/2020/12:26:30 PM    Final     Cardiac Studies   Echo 10/29/19:  1. Left ventricular ejection fraction, by estimation, is 45 to 50%. The  left ventricle has mildly decreased function. There is severe left  ventricular hypertrophy of the posterior segment.  2. Right ventricular systolic function is normal. The right ventricular  size is  normal.  3. Left atrial size was severely dilated.  4. Moderate pericardial effusion. The pericardial effusion is  circumferential. There is no evidence of cardiac tamponade.  5. The aortic valve was not well visualized.   Echo 03/14/20: 1. Left ventricular ejection fraction, by estimation, is 40 to 45%. The  left ventricle has mildly decreased function. The left ventricle  demonstrates regional wall motion abnormalities (see scoring  diagram/findings for description). There is moderate  concentric left ventricular hypertrophy. Left ventricular  diastolic  parameters are consistent with Grade III diastolic dysfunction  (restrictive).  2. Right ventricular systolic function is normal. The right ventricular  size is normal. There is mildly elevated pulmonary artery systolic  pressure.  3. Left atrial size was moderately dilated.  4. Right atrial size was mildly dilated.  5. Largest dimension 3.4 cm posterior to RA without evidence of  tamponade.. Large pericardial effusion. The pericardial effusion is  circumferential. There is no evidence of cardiac tamponade.  6. The mitral valve is normal in structure. Trivial mitral valve  regurgitation. No evidence of mitral stenosis.  7. The aortic valve is tricuspid. Aortic valve regurgitation is not  visualized. No aortic stenosis is present.  8. The inferior vena cava is dilated in size with <50% respiratory  variability, suggesting right atrial pressure of 15 mmHg.   [place] 03/14/20:   Previously placed Mid RCA to Dist RCA stent (unknown type) is widely patent.  Prox Cx lesion is 50% stenosed.  Mid RCA lesion is 40% stenosed.  Mid LAD-1 lesion is 50% stenosed.  Mid LAD-2 lesion is 100% stenosed with 100% stenosed side branch in 2nd Sept.  Mid LAD-3 lesion is 100% stenosed.   IMPRESSION: [name] has a total LAD in the midportion without collaterals.  He had ongoing chest pain for several days prior to admission suggesting  that he had completed an out of hospital anterior STEMI.  He has Q waves across his precordium and troponins which are flat.  Given the fact that there were no collaterals I suspect that he has no viable myocardium beyond his occluded mid LAD and therefore no benefit would be derived from opening this diffusely diseased vessel.  2D echo is pending.  Recommend medical therapy.  If he has ongoing pain and an akinetic anterior wall we could consider doing a viability cardiac MRI.  Depending on his LV function, if his EF is less than 35% he may benefit from a LifeVest.     Patient Profile     64 y.o. male with CAD s/p PCI 2012 and 7096, chronic diastolic heart failure, recurrent pericardial effusion s/p pericardial window x2, GERD, HTN, LH, CKD III, and OSA admitted with NSTEMI.  Assessment & Plan    # NSTEMI:  # HL:  Mr. Liam Graham likely had an out of hospital STEMI.  Patient presented with 4 days of CP.  HS troponin 144-->118-->149.  He was found to have an occluded LAD with extensive disease.  Given that his EKG revealed Q waves and his late presentation this was thought to be a completed MI.  He is not currently having chest pain or pressure.  We will manage medically.  Echo this admission revealed LVEF 40 to 45% with wall motion abnormalities consistent with LAD infarct and grade 3 diastolic dysfunction.  Right atrial pressure was 15 mmHg.  This was only slightly reduced from 45 to 50% in March 2021.  He does still have a large pericardial effusion.  There is no evidence of tamponade.    Renal function improving.  He continues to have chest pain today.  Plan for LHC today.  Intolerant of statins.  Has been seen by lipid clinic.  He has been approved by [employer].  We will start this at discharge.  Stop IV nitroglycerin and resume home isosorbide mononitrate.  If he has recurrent chest discomfort we can increase the dose.  For now we will leave at his home dose so that we can hopefully have a blood  pressure  room to add an ARB.  We will switch metoprolol to succinate.  # Recurrent pericardial effusions:  Slightly larger on echo this admission then 10/2019.  No evidence of tamponade.  He is already had 2 pericardial windows.  Resume diuretics.  He does not have any symptoms of pericarditis.  # Acute on chronic systolic and diastolic heart failure: # CKD 3:  LVEF 40-45%.  Home diuretics and ARB were held in preparation for cardiac cath.  Renal function stable.  We will give 2 doses of Lasix 40 mg IV and likely switch to his home torsemide tomorrow.  # Morbid obesity:  Cardiac rehab at discharge.  # Gout:  R hand gout.  Continue allopurinol and add colchicine.    For questions or updates, please contact [place] Please consult [email or web address] for contact info under        Signed, Skeet Latch, MD  03/15/2020, 8:35 AM

## 2020-03-16 ENCOUNTER — Ambulatory Visit: Payer: Medicare HMO

## 2020-03-16 DIAGNOSIS — N183 Chronic kidney disease, stage 3 unspecified: Secondary | ICD-10-CM

## 2020-03-16 DIAGNOSIS — I214 Non-ST elevation (NSTEMI) myocardial infarction: Secondary | ICD-10-CM | POA: Diagnosis not present

## 2020-03-16 DIAGNOSIS — I5043 Acute on chronic combined systolic (congestive) and diastolic (congestive) heart failure: Secondary | ICD-10-CM

## 2020-03-16 DIAGNOSIS — N179 Acute kidney failure, unspecified: Secondary | ICD-10-CM | POA: Diagnosis not present

## 2020-03-16 DIAGNOSIS — E1169 Type 2 diabetes mellitus with other specified complication: Secondary | ICD-10-CM | POA: Diagnosis not present

## 2020-03-16 DIAGNOSIS — E669 Obesity, unspecified: Secondary | ICD-10-CM

## 2020-03-16 LAB — GLUCOSE, CAPILLARY
Glucose-Capillary: 115 mg/dL — ABNORMAL HIGH (ref 70–99)
Glucose-Capillary: 109 mg/dL — ABNORMAL HIGH (ref 70–99)
Glucose-Capillary: 92 mg/dL (ref 70–99)

## 2020-03-16 LAB — BASIC METABOLIC PANEL
Anion gap: 9 (ref 5–15)
BUN: 47 mg/dL — ABNORMAL HIGH (ref 8–23)
CO2: 24 mmol/L (ref 22–32)
Calcium: 8.6 mg/dL — ABNORMAL LOW (ref 8.9–10.3)
Chloride: 104 mmol/L (ref 98–111)
Creatinine, Ser: 1.92 mg/dL — ABNORMAL HIGH (ref 0.61–1.24)
GFR calc Af Amer: 42 mL/min — ABNORMAL LOW (ref >60)
GFR calc non Af Amer: 36 mL/min — ABNORMAL LOW (ref >60)
Glucose, Bld: 66 mg/dL — ABNORMAL LOW (ref 70–99)
Potassium: 4.8 mmol/L (ref 3.5–5.1)
Sodium: 137 mmol/L (ref 135–145)

## 2020-03-16 LAB — CBC
HCT: 30.2 % — ABNORMAL LOW (ref 39.0–52.0)
Hemoglobin: 9.3 g/dL — ABNORMAL LOW (ref 13.0–17.0)
MCH: 28.5 pg (ref 26.0–34.0)
MCHC: 30.8 g/dL (ref 30.0–36.0)
MCV: 92.6 fL (ref 80.0–100.0)
Platelets: 159 10*3/uL (ref 150–400)
RBC: 3.26 MIL/uL — ABNORMAL LOW (ref 4.22–5.81)
RDW: 15.1 % (ref 11.5–15.5)
WBC: 5.4 10*3/uL (ref 4.0–10.5)
nRBC: 0 % (ref 0.0–0.2)

## 2020-03-16 MED ORDER — FUROSEMIDE 10 MG/ML IJ SOLN
80.0000 mg | Freq: Two times a day (BID) | INTRAMUSCULAR | Status: DC
  Administered 2020-03-16 – 2020-03-17 (×3): 80 mg via INTRAVENOUS
  Filled 2020-03-16 (×3): qty 8

## 2020-03-16 MED ORDER — INSULIN ASPART 100 UNIT/ML ~~LOC~~ SOLN
0.0000 [IU] | Freq: Three times a day (TID) | SUBCUTANEOUS | Status: DC
  Administered 2020-03-17: 2 [IU] via SUBCUTANEOUS

## 2020-03-16 MED ORDER — METOLAZONE 2.5 MG PO TABS
2.5000 mg | ORAL_TABLET | Freq: Once | ORAL | Status: AC
  Administered 2020-03-16: 2.5 mg via ORAL
  Filled 2020-03-16: qty 1

## 2020-03-16 MED ORDER — INSULIN DETEMIR 100 UNIT/ML ~~LOC~~ SOLN
55.0000 [IU] | Freq: Every day | SUBCUTANEOUS | Status: DC
  Administered 2020-03-16 – 2020-03-17 (×2): 55 [IU] via SUBCUTANEOUS
  Filled 2020-03-16 (×3): qty 0.55

## 2020-03-16 MED ORDER — CLOPIDOGREL BISULFATE 75 MG PO TABS
75.0000 mg | ORAL_TABLET | Freq: Every day | ORAL | Status: DC
  Administered 2020-03-16 – 2020-03-18 (×3): 75 mg via ORAL
  Filled 2020-03-16 (×3): qty 1

## 2020-03-16 NOTE — Progress Notes (Signed)
Progress Note  Patient Name: Eugene [name] Date of Encounter: 03/16/2020  [facility] Cardiologist: Kate Sable, MD (Inactive)   Subjective   Feeling well.  Wants to go home.  Denies chest pain or pressure.  He does not use oxygen at home.  Inpatient Medications    Scheduled Meds: . allopurinol  100 mg Oral Daily  . aspirin  81 mg Oral Daily  . colchicine  0.6 mg Oral Daily  . furosemide  80 mg Intravenous BID  . glimepiride  1 mg Oral Q breakfast  . insulin detemir  55 Units Subcutaneous QHS  . isosorbide mononitrate  60 mg Oral Daily  . metolazone  2.5 mg Oral Once  . metoprolol succinate  50 mg Oral Daily  . sodium chloride flush  3 mL Intravenous Q12H  . sodium chloride flush  3 mL Intravenous Q12H   Continuous Infusions: . sodium chloride 250 mL (03/15/20 0832)   PRN Meds: sodium chloride, acetaminophen, acetaminophen, ALPRAZolam, ondansetron (ZOFRAN) IV, sodium chloride flush   Vital Signs    Vitals:   03/15/20 2351 03/16/20 0355 03/16/20 0643 03/16/20 0829  BP: (!) 107/57 137/71  121/69  Pulse:  80  72  Resp: 20 (!) 22  18  Temp: 98 F (36.7 C) 98 F (36.7 C)  97.6 F (36.4 C)  TempSrc: Oral   Oral  SpO2:  97%  98%  Weight:   118.2 kg   Height:        Intake/Output Summary (Last 24 hours) at 03/16/2020 1003 Last data filed at 03/16/2020 0300 Gross per 24 hour  Intake 760.45 ml  Output 500 ml  Net 260.45 ml   Last 3 Weights 03/16/2020 03/15/2020 03/14/2020  Weight (lbs) 260 lb 9.3 oz 259 lb 14.8 oz 258 lb 13.1 oz  Weight (kg) 118.2 kg 117.9 kg 117.4 kg      Telemetry    Sinus rhythm.  No events. - Personally Reviewed  ECG    03/14/20: Sinus rhythm.  Rate 80 bpm.  First degree AV block. LAD.  Low voltage.  IRBBB. Prior anteroseptal infarct - Personally Reviewed  Physical Exam   VS:  BP 121/69 (BP Location: Left Arm)   Pulse 72   Temp 97.6 F (36.4 C) (Oral)   Resp 18   Ht 6\' 1"  (1.854 m)   Wt 118.2 kg   SpO2 98%   BMI  34.38 kg/m  , BMI Body mass index is 34.38 kg/m. GENERAL:  Well appearing HEENT: Pupils equal round and reactive, fundi not visualized, oral mucosa unremarkable NECK:  + jugular venous distention, waveform within normal limits, carotid upstroke brisk and symmetric, no bruits LUNGS:  Clear to auscultation bilaterally HEART:  RRR.  PMI not displaced or sustained,S1 and S2 within normal limits, no S3, no S4, no clicks, no rubs, no murmurs ABD:  Flat, positive bowel sounds normal in frequency in pitch, no bruits, no rebound, no guarding, no midline pulsatile mass, no hepatomegaly, no splenomegaly EXT:  2 plus pulses throughout, trace edema, no cyanosis no clubbing SKIN:  No rashes no nodules NEURO:  Cranial nerves II through XII grossly intact, motor grossly intact throughout PSYCH:  Cognitively intact, oriented to person place and time   Labs    High Sensitivity Troponin:   Recent Labs  Lab 03/12/20 0257 03/12/20 0537 03/13/20 0001 03/13/20 0645  TROPONINIHS 148* 144* 118* 149*      Chemistry Recent Labs  Lab 03/14/20 0248 03/15/20 0234 03/16/20 0246  NA 137 137 137  K 4.7 5.4* 4.8  CL 103 104 104  CO2 25 25 24   GLUCOSE 90 183* 66*  BUN 44* 40* 47*  CREATININE 1.80* 1.68* 1.92*  CALCIUM 8.5* 8.5* 8.6*  GFRNONAA 39* 43* 36*  GFRAA 45* 49* 42*  ANIONGAP 9 8 9      Hematology Recent Labs  Lab 03/14/20 0248 03/15/20 0234 03/16/20 0246  WBC 4.7 5.7 5.4  RBC 3.65* 3.59* 3.26*  HGB 10.3* 10.2* 9.3*  HCT 33.4* 33.4* 30.2*  MCV 91.5 93.0 92.6  MCH 28.2 28.4 28.5  MCHC 30.8 30.5 30.8  RDW 15.7* 15.4 15.1  PLT 157 149* 159    BNP Recent Labs  Lab 03/12/20 0300 03/13/20 0001  BNP 248.0* 322.4*     DDimer No results for input(s): DDIMER in the last 168 hours.   Radiology    CARDIAC CATHETERIZATION  Result Date: 03/14/2020  Previously placed Mid RCA to Dist RCA stent (unknown type) is widely patent.  Prox Cx lesion is 50% stenosed.  Mid RCA lesion is 40%  stenosed.  Mid LAD-1 lesion is 50% stenosed.  Mid LAD-2 lesion is 100% stenosed with 100% stenosed side branch in 2nd Sept.  Mid LAD-3 lesion is 100% stenosed.  Eugene [name] is a 64 y.o. male  [id number] LOCATION:  FACILITY: [facility] PHYSICIAN: Eugene [name], M.D. 1956/06/17 DATE OF PROCEDURE:  03/14/2020 DATE OF DISCHARGE: CARDIAC CATHETERIZATION History obtained from chart review.64 y.o. male with CAD s/p PCI 2012 and 8182, chronic diastolic heart failure, recurrent pericardial effusion s/p pericardial window x2, GERD, HTN, LH, CKD III, and OSA admitted with NSTEMI.   Eugene [name] has a total LAD in the midportion without collaterals.  He had ongoing chest pain for several days prior to admission suggesting that he had completed an out of hospital anterior STEMI.  He has Q waves across his precordium and troponins which are flat.  Given the fact that there were no collaterals I suspect that he has no viable myocardium beyond his occluded mid LAD and therefore no benefit would be derived from opening this diffusely diseased vessel.  2D echo is pending.  Recommend medical therapy.  If he has ongoing pain and an akinetic anterior wall we could consider doing a viability cardiac MRI.  Depending on his LV function, if his EF is less than 35% he may benefit from a LifeVest.  The sheath was removed and a TR band was placed on the right wrist to achieve patent hemostasis.  The patient left lab in stable condition. Eugene [name]. MD, [facility] 03/14/2020 10:38 AM   ECHOCARDIOGRAM COMPLETE  Result Date: 03/14/2020    ECHOCARDIOGRAM REPORT   Patient Name:   Eugene [name] [facility] Date of Exam: 03/14/2020 Medical Rec #:  [id number]           Height:       73.0 in Accession #:    [id number]          Weight:       258.8 lb Date of Birth:  Dec 01, 1955           BSA:          2.401 m Patient Age:    37 years            BP:           135/82 mmHg Patient Gender: M                   HR:  80 bpm. Exam Location:  Inpatient  Procedure: 2D Echo, Cardiac Doppler and Color Doppler Indications:    Chest pain  History:        Patient has prior history of Echocardiogram examinations, most                 recent 05/19/2020. CHF, Previous Myocardial Infarction,                 Pericardial Disease and CAD, Signs/Symptoms:Chest Pain; Risk                 Factors:Diabetes and Obesity.  Sonographer:    Dustin Flock Referring Phys: [id number] PETER M [place]  Sonographer Comments: Patient is morbidly obese. Image acquisition challenging due to patient body habitus. IMPRESSIONS  1. Left ventricular ejection fraction, by estimation, is 40 to 45%. The left ventricle has mildly decreased function. The left ventricle demonstrates regional wall motion abnormalities (see scoring diagram/findings for description). There is moderate concentric left ventricular hypertrophy. Left ventricular diastolic parameters are consistent with Grade III diastolic dysfunction (restrictive).  2. Right ventricular systolic function is normal. The right ventricular size is normal. There is mildly elevated pulmonary artery systolic pressure.  3. Left atrial size was moderately dilated.  4. Right atrial size was mildly dilated.  5. Largest dimension 3.4 cm posterior to RA without evidence of tamponade.. Large pericardial effusion. The pericardial effusion is circumferential. There is no evidence of cardiac tamponade.  6. The mitral valve is normal in structure. Trivial mitral valve regurgitation. No evidence of mitral stenosis.  7. The aortic valve is tricuspid. Aortic valve regurgitation is not visualized. No aortic stenosis is present.  8. The inferior vena cava is dilated in size with <50% respiratory variability, suggesting right atrial pressure of 15 mmHg. Comparison(s): Changes from prior study are noted. Conclusion(s)/Recommendation(s): While there was some mild hypokinesis of the mid to distal anterior wall before, it appears more pronounced on today's study. EF slightly  reduced compared to prior. Effusion seen on prior studies, no evidence of tamponade. FINDINGS  Left Ventricle: Left ventricular ejection fraction, by estimation, is 40 to 45%. The left ventricle has mildly decreased function. The left ventricle demonstrates regional wall motion abnormalities. The left ventricular internal cavity size was normal in size. There is moderate concentric left ventricular hypertrophy. Left ventricular diastolic parameters are consistent with Grade III diastolic dysfunction (restrictive).  LV Wall Scoring: The apex is akinetic. The mid and distal anterior wall, mid and distal anterior septum, apical lateral segment, mid anterolateral segment, mid inferoseptal segment, and apical inferior segment are hypokinetic. The inferior wall, posterior wall, basal anteroseptal segment, basal anterolateral segment, basal anterior segment, and basal inferoseptal segment are normal. Right Ventricle: The right ventricular size is normal. Right vetricular wall thickness was not assessed. Right ventricular systolic function is normal. There is mildly elevated pulmonary artery systolic pressure. The tricuspid regurgitant velocity is 2.67 m/s, and with an assumed right atrial pressure of 15 mmHg, the estimated right ventricular systolic pressure is 40.9 mmHg. Left Atrium: Left atrial size was moderately dilated. Right Atrium: Right atrial size was mildly dilated. Pericardium: Largest dimension 3.4 cm posterior to RA without evidence of tamponade. A large pericardial effusion is present. The pericardial effusion is circumferential. There is no evidence of cardiac tamponade. Mitral Valve: The mitral valve is normal in structure. Trivial mitral valve regurgitation. No evidence of mitral valve stenosis. Tricuspid Valve: The tricuspid valve is normal in structure. Tricuspid valve regurgitation is trivial. No evidence of tricuspid  stenosis. Aortic Valve: The aortic valve is tricuspid. Aortic valve regurgitation is  not visualized. No aortic stenosis is present. Pulmonic Valve: The pulmonic valve was not well visualized. Pulmonic valve regurgitation is not visualized. Aorta: The aortic root and ascending aorta are structurally normal, with no evidence of dilitation. Venous: The inferior vena cava is dilated in size with less than 50% respiratory variability, suggesting right atrial pressure of 15 mmHg. IAS/Shunts: The atrial septum is grossly normal.  LEFT VENTRICLE PLAX 2D LVIDd:         5.10 cm  Diastology LVIDs:         3.20 cm  LV e' lateral:   8.19 cm/s LV PW:         1.50 cm  LV E/e' lateral: 15.8 LV IVS:        1.80 cm  LV e' medial:    3.57 cm/s LVOT diam:     2.50 cm  LV E/e' medial:  36.1 LV SV:         96 LV SV Index:   40 LVOT Area:     4.91 cm  RIGHT VENTRICLE RV Basal diam:  3.60 cm RV S prime:     5.68 cm/s TAPSE (M-mode): 3.4 cm LEFT ATRIUM             Index       RIGHT ATRIUM           Index LA diam:        4.90 cm 2.04 cm/m  RA Area:     19.00 cm LA Vol (A2C):   92.3 ml 38.45 ml/m RA Volume:   55.10 ml  22.95 ml/m LA Vol (A4C):   95.2 ml 39.65 ml/m LA Biplane Vol: 96.6 ml 40.24 ml/m  AORTIC VALVE LVOT Vmax:   117.00 cm/s LVOT Vmean:  77.200 cm/s LVOT VTI:    0.195 m  AORTA Ao Root diam: 3.50 cm MITRAL VALVE                TRICUSPID VALVE MV Area (PHT): 7.99 cm     TR Peak grad:   28.5 mmHg MV Decel Time: 95 msec      TR Vmax:        267.00 cm/s MV E velocity: 129.00 cm/s MV A velocity: 55.10 cm/s   SHUNTS MV E/A ratio:  2.34         Systemic VTI:  0.20 m                             Systemic Diam: 2.50 cm Buford Dresser MD Electronically signed by Buford Dresser MD Signature Date/Time: 03/14/2020/12:26:30 PM    Final     Cardiac Studies   Echo 10/29/19:  1. Left ventricular ejection fraction, by estimation, is 45 to 50%. The  left ventricle has mildly decreased function. There is severe left  ventricular hypertrophy of the posterior segment.  2. Right ventricular systolic function  is normal. The right ventricular  size is normal.  3. Left atrial size was severely dilated.  4. Moderate pericardial effusion. The pericardial effusion is  circumferential. There is no evidence of cardiac tamponade.  5. The aortic valve was not well visualized.   Echo 03/14/20: 1. Left ventricular ejection fraction, by estimation, is 40 to 45%. The  left ventricle has mildly decreased function. The left ventricle  demonstrates regional wall motion abnormalities (see scoring  diagram/findings for description). There is  moderate  concentric left ventricular hypertrophy. Left ventricular diastolic  parameters are consistent with Grade III diastolic dysfunction  (restrictive).  2. Right ventricular systolic function is normal. The right ventricular  size is normal. There is mildly elevated pulmonary artery systolic  pressure.  3. Left atrial size was moderately dilated.  4. Right atrial size was mildly dilated.  5. Largest dimension 3.4 cm posterior to RA without evidence of  tamponade.. Large pericardial effusion. The pericardial effusion is  circumferential. There is no evidence of cardiac tamponade.  6. The mitral valve is normal in structure. Trivial mitral valve  regurgitation. No evidence of mitral stenosis.  7. The aortic valve is tricuspid. Aortic valve regurgitation is not  visualized. No aortic stenosis is present.  8. The inferior vena cava is dilated in size with <50% respiratory  variability, suggesting right atrial pressure of 15 mmHg.   [place] 03/14/20:   Previously placed Mid RCA to Dist RCA stent (unknown type) is widely patent.  Prox Cx lesion is 50% stenosed.  Mid RCA lesion is 40% stenosed.  Mid LAD-1 lesion is 50% stenosed.  Mid LAD-2 lesion is 100% stenosed with 100% stenosed side branch in 2nd Sept.  Mid LAD-3 lesion is 100% stenosed.   IMPRESSION: Eugene [name] has a total LAD in the midportion without collaterals.  He had ongoing chest pain for  several days prior to admission suggesting that he had completed an out of hospital anterior STEMI.  He has Q waves across his precordium and troponins which are flat.  Given the fact that there were no collaterals I suspect that he has no viable myocardium beyond his occluded mid LAD and therefore no benefit would be derived from opening this diffusely diseased vessel.  2D echo is pending.  Recommend medical therapy.  If he has ongoing pain and an akinetic anterior wall we could consider doing a viability cardiac MRI.  Depending on his LV function, if his EF is less than 35% he may benefit from a LifeVest.     Patient Profile     64 y.o. male with CAD s/p PCI 2012 and 5809, chronic diastolic heart failure, recurrent pericardial effusion s/p pericardial window x2, GERD, HTN, LH, CKD III, and OSA admitted with NSTEMI.  Assessment & Plan    # NSTEMI:  # HL:  Mr. Liam Graham likely had an out of hospital STEMI.  Patient presented with 4 days of CP.  HS troponin 144-->118-->149.  He was found to have an occluded LAD with extensive disease.  Given that his EKG revealed Q waves and his late presentation this was thought to be a completed MI.  He is not currently having chest pain or pressure.  We will manage medically.  Echo this admission revealed LVEF 40 to 45% with wall motion abnormalities consistent with LAD infarct and grade 3 diastolic dysfunction.  He has not had any recurrent chest pain since stopping IV nitroglycerin and resuming his home Imdur.  Continue aspirin and add clopidogrel.  We will plan to treat with dual antiplatelet therapy for 1 year.  He is intolerant of statins.  Has been seen by lipid clinic.  He has been approved by [employer].  We will start this at discharge.  Continue metoprolol.  # Acute on chronic systolic and diastolic heart failure: # CKD 3:  LVEF 40-45%. Right atrial pressure was 15 mmHg.  This was only slightly reduced from 45 to 50% in March 2021.  He does still have a large  pericardial  effusion.  There is no evidence of tamponade.  Home diuretics and ARB were held in preparation for cardiac cath.  He remains volume overloaded and did not respond to IV Lasix yesterday.  At home he takes torsemide.  We will increase Lasix to 80 mg IV.  I will also give him a dose of metolazone prior to his morning dose of Lasix.  Continue to hold home ARB for now in the setting of active diuresis and acute on chronic renal failure.  Will resume once he is more euvolemic and renal function is stable.  # Recurrent pericardial effusions: Slightly larger on echo this admission then 10/2019.  No evidence of tamponade.  He is already had 2 pericardial windows.  Resume diuretics.  He does not have any symptoms of pericarditis.  # DM:  Appreciate diabetes management recommendations.  Will adjust insulin as recommended.   # Morbid obesity:  Cardiac rehab at discharge.  # Gout:  R hand gout.  Continue allopurinol and add colchicine.    For questions or updates, please contact Damar Please consult [email or web address] for contact info under        Signed, Skeet Latch, MD  03/16/2020, 10:03 AM

## 2020-03-16 NOTE — Progress Notes (Signed)
Inpatient Diabetes Program Recommendations  AACE/ADA: New Consensus Statement on Inpatient Glycemic Control (2015)  Target Ranges:  Prepandial:   less than 140 mg/dL      Peak postprandial:   less than 180 mg/dL (1-2 hours)      Critically ill patients:  140 - 180 mg/dL   Lab Results  Component Value Date   GLUCAP 131 (H) 03/15/2020   HGBA1C 7.3 (H) 03/04/2020    Review of Glycemic Control  Diabetes history: DM2 Outpatient Diabetes medications: Levemir 60 units QHS, previously on Amaryl Current orders for Inpatient glycemic control: Levemir 60 units QHS, Amaryl 1 mg QAM  HgbA1C - 7.3%  Inpatient Diabetes Program Recommendations:     Decrease Levemir to 55 units QHS Add Novolog 0-6 units tidwc  Follow glucose trends.  Thank you. Lorenda Peck, RD, LDN, CDE Inpatient Diabetes Coordinator [phone]

## 2020-03-17 DIAGNOSIS — I214 Non-ST elevation (NSTEMI) myocardial infarction: Secondary | ICD-10-CM | POA: Diagnosis not present

## 2020-03-17 DIAGNOSIS — I5043 Acute on chronic combined systolic (congestive) and diastolic (congestive) heart failure: Secondary | ICD-10-CM | POA: Diagnosis not present

## 2020-03-17 DIAGNOSIS — N183 Chronic kidney disease, stage 3 unspecified: Secondary | ICD-10-CM | POA: Diagnosis not present

## 2020-03-17 DIAGNOSIS — N179 Acute kidney failure, unspecified: Secondary | ICD-10-CM | POA: Diagnosis not present

## 2020-03-17 LAB — CBC
HCT: 30 % — ABNORMAL LOW (ref 39.0–52.0)
Hemoglobin: 9.3 g/dL — ABNORMAL LOW (ref 13.0–17.0)
MCH: 28.5 pg (ref 26.0–34.0)
MCHC: 31 g/dL (ref 30.0–36.0)
MCV: 92 fL (ref 80.0–100.0)
Platelets: 163 10*3/uL (ref 150–400)
RBC: 3.26 MIL/uL — ABNORMAL LOW (ref 4.22–5.81)
RDW: 14.9 % (ref 11.5–15.5)
WBC: 4 10*3/uL (ref 4.0–10.5)
nRBC: 0 % (ref 0.0–0.2)

## 2020-03-17 LAB — GLUCOSE, CAPILLARY
Glucose-Capillary: 46 mg/dL — ABNORMAL LOW (ref 70–99)
Glucose-Capillary: 136 mg/dL — ABNORMAL HIGH (ref 70–99)
Glucose-Capillary: 48 mg/dL — ABNORMAL LOW (ref 70–99)
Glucose-Capillary: 121 mg/dL — ABNORMAL HIGH (ref 70–99)
Glucose-Capillary: 204 mg/dL — ABNORMAL HIGH (ref 70–99)
Glucose-Capillary: 185 mg/dL — ABNORMAL HIGH (ref 70–99)

## 2020-03-17 LAB — BASIC METABOLIC PANEL
Anion gap: 7 (ref 5–15)
Anion gap: 9 (ref 5–15)
BUN: 58 mg/dL — ABNORMAL HIGH (ref 8–23)
BUN: 58 mg/dL — ABNORMAL HIGH (ref 8–23)
CO2: 28 mmol/L (ref 22–32)
CO2: 29 mmol/L (ref 22–32)
Calcium: 9.2 mg/dL (ref 8.9–10.3)
Calcium: 9.2 mg/dL (ref 8.9–10.3)
Chloride: 103 mmol/L (ref 98–111)
Chloride: 99 mmol/L (ref 98–111)
Creatinine, Ser: 2.01 mg/dL — ABNORMAL HIGH (ref 0.61–1.24)
Creatinine, Ser: 1.95 mg/dL — ABNORMAL HIGH (ref 0.61–1.24)
GFR calc Af Amer: 40 mL/min — ABNORMAL LOW (ref >60)
GFR calc Af Amer: 41 mL/min — ABNORMAL LOW (ref >60)
GFR calc non Af Amer: 34 mL/min — ABNORMAL LOW (ref >60)
GFR calc non Af Amer: 36 mL/min — ABNORMAL LOW (ref >60)
Glucose, Bld: 56 mg/dL — ABNORMAL LOW (ref 70–99)
Glucose, Bld: 155 mg/dL — ABNORMAL HIGH (ref 70–99)
Potassium: 4.7 mmol/L (ref 3.5–5.1)
Potassium: 5.1 mmol/L (ref 3.5–5.1)
Sodium: 138 mmol/L (ref 135–145)
Sodium: 137 mmol/L (ref 135–145)

## 2020-03-17 MED ORDER — TORSEMIDE 20 MG PO TABS
60.0000 mg | ORAL_TABLET | Freq: Two times a day (BID) | ORAL | Status: DC
  Administered 2020-03-17 – 2020-03-18 (×3): 60 mg via ORAL
  Filled 2020-03-17 (×3): qty 3

## 2020-03-17 MED ORDER — TORSEMIDE 20 MG PO TABS
60.0000 mg | ORAL_TABLET | Freq: Every day | ORAL | Status: DC
  Administered 2020-03-17: 60 mg via ORAL
  Filled 2020-03-17: qty 3

## 2020-03-17 NOTE — Progress Notes (Signed)
Progress Note  Patient Name: Eugene [name] Date of Encounter: 03/17/2020  [facility] Cardiologist: Eugene Sable, MD (Inactive) -wants to see Dr. Harl [name]  Subjective   Feeling well.  He really wants to go home.  He had a good response to torsemide and thinks he can continue diuresing at home.  Inpatient Medications    Scheduled Meds: . allopurinol  100 mg Oral Daily  . aspirin  81 mg Oral Daily  . clopidogrel  75 mg Oral Daily  . colchicine  0.6 mg Oral Daily  . furosemide  80 mg Intravenous BID  . glimepiride  1 mg Oral Q breakfast  . insulin aspart  0-6 Units Subcutaneous TID WC  . insulin detemir  55 Units Subcutaneous QHS  . isosorbide mononitrate  60 mg Oral Daily  . metoprolol succinate  50 mg Oral Daily  . sodium chloride flush  3 mL Intravenous Q12H  . sodium chloride flush  3 mL Intravenous Q12H  . torsemide  60 mg Oral Daily   Continuous Infusions: . sodium chloride 250 mL (03/15/20 0832)   PRN Meds: sodium chloride, acetaminophen, acetaminophen, ALPRAZolam, ondansetron (ZOFRAN) IV, sodium chloride flush   Vital Signs    Vitals:   03/16/20 1752 03/16/20 1921 03/16/20 2300 03/17/20 0315  BP:  108/72 122/75 121/67  Pulse: 68 74 70 70  Resp: 18 (!) 22 20 19   Temp:  97.8 F (36.6 C) 97.7 F (36.5 C) 97.6 F (36.4 C)  TempSrc:  Oral Oral Oral  SpO2: 97% 95% 96% 100%  Weight:    118.6 kg  Height:        Intake/Output Summary (Last 24 hours) at 03/17/2020 0329 Last data filed at 03/17/2020 0300 Gross per 24 hour  Intake 469 ml  Output 1375 ml  Net -906 ml   Last 3 Weights 03/17/2020 03/16/2020 03/15/2020  Weight (lbs) 261 lb 7.5 oz 260 lb 9.3 oz 259 lb 14.8 oz  Weight (kg) 118.6 kg 118.2 kg 117.9 kg      Telemetry    Sinus rhythm.  No events. - Personally Reviewed  ECG    03/14/20: Sinus rhythm.  Rate 80 bpm.  First degree AV block. LAD.  Low voltage.  IRBBB. Prior anteroseptal infarct - Personally Reviewed  Physical Exam   VS:  BP  121/67 (BP Location: Right Arm)   Pulse 70   Temp 97.6 F (36.4 C) (Oral)   Resp 19   Ht 6\' 1"  (1.854 m)   Wt 118.6 kg   SpO2 100%   BMI 34.50 kg/m  , BMI Body mass index is 34.5 kg/m. GENERAL:  Well appearing HEENT: Pupils equal round and reactive, fundi not visualized, oral mucosa unremarkable NECK:  + jugular venous distention, waveform within normal limits, carotid upstroke brisk and symmetric, no bruits LUNGS:  Clear to auscultation bilaterally HEART:  RRR.  PMI not displaced or sustained,S1 and S2 within normal limits, no S3, no S4, no clicks, no rubs, no murmurs ABD:  Flat, positive bowel sounds normal in frequency in pitch, no bruits, no rebound, no guarding, no midline pulsatile mass, no hepatomegaly, no splenomegaly EXT:  2 plus pulses throughout, trace edema, no cyanosis no clubbing SKIN:  No rashes no nodules NEURO:  Cranial nerves II through XII grossly intact, motor grossly intact throughout PSYCH:  Cognitively intact, oriented to person place and time   Labs    High Sensitivity Troponin:   Recent Labs  Lab 03/12/20 0257 03/12/20 0537 03/13/20 0001  03/13/20 0645  TROPONINIHS 148* 144* 118* 149*      Chemistry Recent Labs  Lab 03/14/20 0248 03/15/20 0234 03/16/20 0246  NA 137 137 137  K 4.7 5.4* 4.8  CL 103 104 104  CO2 25 25 24   GLUCOSE 90 183* 66*  BUN 44* 40* 47*  CREATININE 1.80* 1.68* 1.92*  CALCIUM 8.5* 8.5* 8.6*  GFRNONAA 39* 43* 36*  GFRAA 45* 49* 42*  ANIONGAP 9 8 9      Hematology Recent Labs  Lab 03/15/20 0234 03/16/20 0246 03/17/20 0224  WBC 5.7 5.4 4.0  RBC 3.59* 3.26* 3.26*  HGB 10.2* 9.3* 9.3*  HCT 33.4* 30.2* 30.0*  MCV 93.0 92.6 92.0  MCH 28.4 28.5 28.5  MCHC 30.5 30.8 31.0  RDW 15.4 15.1 14.9  PLT 149* 159 163    BNP Recent Labs  Lab 03/12/20 0300 03/13/20 0001  BNP 248.0* 322.4*     DDimer No results for input(s): DDIMER in the last 168 hours.   Radiology    No results found.  Cardiac Studies   Echo  10/29/19:  1. Left ventricular ejection fraction, by estimation, is 45 to 50%. The  left ventricle has mildly decreased function. There is severe left  ventricular hypertrophy of the posterior segment.  2. Right ventricular systolic function is normal. The right ventricular  size is normal.  3. Left atrial size was severely dilated.  4. Moderate pericardial effusion. The pericardial effusion is  circumferential. There is no evidence of cardiac tamponade.  5. The aortic valve was not well visualized.   Echo 03/14/20: 1. Left ventricular ejection fraction, by estimation, is 40 to 45%. The  left ventricle has mildly decreased function. The left ventricle  demonstrates regional wall motion abnormalities (see scoring  diagram/findings for description). There is moderate  concentric left ventricular hypertrophy. Left ventricular diastolic  parameters are consistent with Grade III diastolic dysfunction  (restrictive).  2. Right ventricular systolic function is normal. The right ventricular  size is normal. There is mildly elevated pulmonary artery systolic  pressure.  3. Left atrial size was moderately dilated.  4. Right atrial size was mildly dilated.  5. Largest dimension 3.4 cm posterior to RA without evidence of  tamponade.. Large pericardial effusion. The pericardial effusion is  circumferential. There is no evidence of cardiac tamponade.  6. The mitral valve is normal in structure. Trivial mitral valve  regurgitation. No evidence of mitral stenosis.  7. The aortic valve is tricuspid. Aortic valve regurgitation is not  visualized. No aortic stenosis is present.  8. The inferior vena cava is dilated in size with <50% respiratory  variability, suggesting right atrial pressure of 15 mmHg.   [place] 03/14/20:   Previously placed Mid RCA to Dist RCA stent (unknown type) is widely patent.  Prox Cx lesion is 50% stenosed.  Mid RCA lesion is 40% stenosed.  Mid LAD-1 lesion is  50% stenosed.  Mid LAD-2 lesion is 100% stenosed with 100% stenosed side branch in 2nd Sept.  Mid LAD-3 lesion is 100% stenosed.   IMPRESSION: [name] has a total LAD in the midportion without collaterals.  He had ongoing chest pain for several days prior to admission suggesting that he had completed an out of hospital anterior STEMI.  He has Q waves across his precordium and troponins which are flat.  Given the fact that there were no collaterals I suspect that he has no viable myocardium beyond his occluded mid LAD and therefore no benefit would be derived  from opening this diffusely diseased vessel.  2D echo is pending.  Recommend medical therapy.  If he has ongoing pain and an akinetic anterior wall we could consider doing a viability cardiac MRI.  Depending on his LV function, if his EF is less than 35% he may benefit from a LifeVest.     Patient Profile     64 y.o. male with CAD s/p PCI 2012 and 2841, chronic diastolic heart failure, recurrent pericardial effusion s/p pericardial window x2, GERD, HTN, LH, CKD III, and OSA admitted with NSTEMI.  Assessment & Plan    # NSTEMI:  # HL:  Mr. Liam Graham likely had an out of hospital STEMI.  Patient presented with 4 days of CP.  HS troponin 144-->118-->149.  He was found to have an occluded LAD with extensive disease.  Given that his EKG revealed Q waves and his late presentation this was thought to be a completed MI.  He is not currently having chest pain or pressure.  We will manage medically.  Echo this admission revealed LVEF 40 to 45% with wall motion abnormalities consistent with LAD infarct and grade 3 diastolic dysfunction.  He has not had any recurrent chest pain since stopping IV nitroglycerin and resuming his home Imdur.  Continue aspirin and clopidogrel was added this admission.  We will plan to treat with dual antiplatelet therapy for 1 year.  He is intolerant of statins.  Has been seen by lipid clinic.  He has been approved by  [employer].  We will start this at discharge.  Continue metoprolol.  # Acute on chronic systolic and diastolic heart failure: # CKD 3:  LVEF 40-45%. Right atrial pressure was 15 mmHg.  This was only slightly reduced from 45 to 50% in March 2021.  He does still have a large pericardial effusion.  There is no evidence of tamponade.  Home diuretics and ARB were held in preparation for cardiac cath.  He remains volume overloaded and did not respond to IV Lasix yesterday despite increasing the dose and adding metolazone.  He reports doing well with torsemide at home and really wants to go home to diurese.  He was given a dose of torsemide this morning and did indeed increase his output.  We will give another dose at noon and check back in this afternoon.  He would really like to go home.  However he understands that he needs to be off supplemental oxygen and continuing to diurese with stable renal function in order to do so.  # Recurrent pericardial effusions: Slightly larger on echo this admission then 10/2019.  No evidence of tamponade.  He is already had 2 pericardial windows.  Resume diuretics.  He does not have any symptoms of pericarditis.  # DM:  Appreciate diabetes management recommendations.  Will adjust insulin as recommended.   # Morbid obesity:  Cardiac rehab at discharge.  # Gout:  R hand gout.  Continue allopurinol and improved after adding colchicine.    For questions or updates, please contact [place] Please consult [email or web address] for contact info under        Signed, Skeet Latch, MD  03/17/2020, 3:29 AM

## 2020-03-17 NOTE — Progress Notes (Signed)
Hypoglycemic Event  CBG: @0621 : 46  @0647 : 48  Treatment: 120 ml of orange juice @0621 +  0647:  breakfast  Symptoms:Patient said he felt funny but asymptomatic.  Follow-up CBG: Time:0720 CBG Result:136  Possible Reasons for Event: 55 units of Levemir @2200   Comments/MD notified: Patient asymptomatic. Patient said it often happens to him in the morning. Will pass it on to the day shift nurse.     Warren Danes

## 2020-03-17 NOTE — Progress Notes (Signed)
Patient desats when he stands and ambulates----standing from sitting position on room air, drops to 89%, after ambulating appx 25 feet, he desats to 87% on room air, will advise dr [place]---patient may need to go home with o2 at discharge

## 2020-03-18 DIAGNOSIS — I5023 Acute on chronic systolic (congestive) heart failure: Secondary | ICD-10-CM

## 2020-03-18 DIAGNOSIS — R6520 Severe sepsis without septic shock: Secondary | ICD-10-CM

## 2020-03-18 DIAGNOSIS — J9601 Acute respiratory failure with hypoxia: Secondary | ICD-10-CM

## 2020-03-18 DIAGNOSIS — J9610 Chronic respiratory failure, unspecified whether with hypoxia or hypercapnia: Secondary | ICD-10-CM | POA: Diagnosis present

## 2020-03-18 LAB — BASIC METABOLIC PANEL
Anion gap: 12 (ref 5–15)
BUN: 66 mg/dL — ABNORMAL HIGH (ref 8–23)
CO2: 27 mmol/L (ref 22–32)
Calcium: 9.5 mg/dL (ref 8.9–10.3)
Chloride: 96 mmol/L — ABNORMAL LOW (ref 98–111)
Creatinine, Ser: 1.92 mg/dL — ABNORMAL HIGH (ref 0.61–1.24)
GFR calc Af Amer: 42 mL/min — ABNORMAL LOW (ref >60)
GFR calc non Af Amer: 36 mL/min — ABNORMAL LOW (ref >60)
Glucose, Bld: 143 mg/dL — ABNORMAL HIGH (ref 70–99)
Potassium: 4.9 mmol/L (ref 3.5–5.1)
Sodium: 135 mmol/L (ref 135–145)

## 2020-03-18 LAB — GLUCOSE, CAPILLARY
Glucose-Capillary: 127 mg/dL — ABNORMAL HIGH (ref 70–99)
Glucose-Capillary: 121 mg/dL — ABNORMAL HIGH (ref 70–99)

## 2020-03-18 MED ORDER — CLOPIDOGREL BISULFATE 75 MG PO TABS: 75.0000 mg | ORAL_TABLET | Freq: Every day | ORAL | 2 refills | Status: DC

## 2020-03-18 MED ORDER — NITROGLYCERIN 0.4 MG SL SUBL: 0.4000 mg | SUBLINGUAL_TABLET | SUBLINGUAL | 2 refills | Status: DC | PRN

## 2020-03-18 MED ORDER — NITROGLYCERIN 0.4 MG SL SUBL: 0.4000 mg | SUBLINGUAL_TABLET | SUBLINGUAL | Status: DC | PRN

## 2020-03-18 MED ORDER — TORSEMIDE 20 MG PO TABS: 60.0000 mg | ORAL_TABLET | Freq: Two times a day (BID) | ORAL | 3 refills | Status: DC

## 2020-03-18 MED ORDER — METOPROLOL SUCCINATE ER 50 MG PO TB24: 50.0000 mg | ORAL_TABLET | Freq: Every day | ORAL | 2 refills | Status: DC

## 2020-03-18 MED ORDER — REPATHA 140 MG/ML ~~LOC~~ SOSY: 140.0000 mg | PREFILLED_SYRINGE | SUBCUTANEOUS | 6 refills | Status: DC

## 2020-03-18 MED ORDER — LOSARTAN POTASSIUM 25 MG PO TABS: ORAL_TABLET | ORAL | 2 refills | Status: DC

## 2020-03-18 NOTE — Progress Notes (Signed)
Progress Note  Patient Name: Eugene [name] Date of Encounter: 03/18/2020  [facility] Cardiologist: Skeet Latch, MD   Subjective   Feeling well.  Ready to go home.  Inpatient Medications    Scheduled Meds: . allopurinol  100 mg Oral Daily  . aspirin  81 mg Oral Daily  . clopidogrel  75 mg Oral Daily  . colchicine  0.6 mg Oral Daily  . glimepiride  1 mg Oral Q breakfast  . insulin aspart  0-6 Units Subcutaneous TID WC  . insulin detemir  55 Units Subcutaneous QHS  . isosorbide mononitrate  60 mg Oral Daily  . metoprolol succinate  50 mg Oral Daily  . sodium chloride flush  3 mL Intravenous Q12H  . sodium chloride flush  3 mL Intravenous Q12H  . torsemide  60 mg Oral BID   Continuous Infusions: . sodium chloride 250 mL (03/15/20 0832)   PRN Meds: sodium chloride, acetaminophen, acetaminophen, ALPRAZolam, ondansetron (ZOFRAN) IV, sodium chloride flush   Vital Signs    Vitals:   03/18/20 0631 03/18/20 0729 03/18/20 0730 03/18/20 0907  BP:    (!) 127/62  Pulse:   77 76  Resp:      Temp:  98.9 F (37.2 C)    TempSrc:  Oral    SpO2:   95%   Weight: (!) 114.5 kg     Height:        Intake/Output Summary (Last 24 hours) at 03/18/2020 0942 Last data filed at 03/18/2020 0900 Gross per 24 hour  Intake 1270 ml  Output 5150 ml  Net -3880 ml   Last 3 Weights 03/18/2020 03/17/2020 03/16/2020  Weight (lbs) 252 lb 6.8 oz 261 lb 7.5 oz 260 lb 9.3 oz  Weight (kg) 114.5 kg 118.6 kg 118.2 kg      Telemetry    Sinus rhythm.  No events. - Personally Reviewed  ECG    03/14/20: Sinus rhythm.  Rate 80 bpm.  First degree AV block. LAD.  Low voltage.  IRBBB. Prior anteroseptal infarct - Personally Reviewed  Physical Exam   VS:  BP (!) 127/62   Pulse 76   Temp 98.9 F (37.2 C) (Oral)   Resp 18   Ht 6\' 1"  (1.854 m)   Wt (!) 114.5 kg   SpO2 95%   BMI 33.30 kg/m  , BMI Body mass index is 33.3 kg/m. GENERAL:  Well appearing HEENT: Pupils equal round and  reactive, fundi not visualized, oral mucosa unremarkable NECK:  + jugular venous distention to just above the clavicle sitting upright, waveform within normal limits, carotid upstroke brisk and symmetric, no bruits LUNGS:  Clear to auscultation bilaterally HEART:  RRR.  PMI not displaced or sustained,S1 and S2 within normal limits, no S3, no S4, no clicks, no rubs, no murmurs ABD:  Flat, positive bowel sounds normal in frequency in pitch, no bruits, no rebound, no guarding, no midline pulsatile mass, no hepatomegaly, no splenomegaly EXT:  2 plus pulses throughout, trace edema, no cyanosis no clubbing SKIN:  No rashes no nodules NEURO:  Cranial nerves II through XII grossly intact, motor grossly intact throughout PSYCH:  Cognitively intact, oriented to person place and time   Labs    High Sensitivity Troponin:   Recent Labs  Lab 03/12/20 0257 03/12/20 0537 03/13/20 0001 03/13/20 0645  TROPONINIHS 148* 144* 118* 149*      Chemistry Recent Labs  Lab 03/16/20 0246 03/17/20 0600 03/17/20 1420  NA 137 138 137  K  4.8 4.7 5.1  CL 104 103 99  CO2 24 28 29   GLUCOSE 66* 56* 155*  BUN 47* 58* 58*  CREATININE 1.92* 2.01* 1.95*  CALCIUM 8.6* 9.2 9.2  GFRNONAA 36* 34* 36*  GFRAA 42* 40* 41*  ANIONGAP 9 7 9      Hematology Recent Labs  Lab 03/15/20 0234 03/16/20 0246 03/17/20 0224  WBC 5.7 5.4 4.0  RBC 3.59* 3.26* 3.26*  HGB 10.2* 9.3* 9.3*  HCT 33.4* 30.2* 30.0*  MCV 93.0 92.6 92.0  MCH 28.4 28.5 28.5  MCHC 30.5 30.8 31.0  RDW 15.4 15.1 14.9  PLT 149* 159 163    BNP Recent Labs  Lab 03/12/20 0300 03/13/20 0001  BNP 248.0* 322.4*     DDimer No results for input(s): DDIMER in the last 168 hours.   Radiology    No results found.  Cardiac Studies   Echo 10/29/19:  1. Left ventricular ejection fraction, by estimation, is 45 to 50%. The  left ventricle has mildly decreased function. There is severe left  ventricular hypertrophy of the posterior segment.  2.  Right ventricular systolic function is normal. The right ventricular  size is normal.  3. Left atrial size was severely dilated.  4. Moderate pericardial effusion. The pericardial effusion is  circumferential. There is no evidence of cardiac tamponade.  5. The aortic valve was not well visualized.   Echo 03/14/20: 1. Left ventricular ejection fraction, by estimation, is 40 to 45%. The  left ventricle has mildly decreased function. The left ventricle  demonstrates regional wall motion abnormalities (see scoring  diagram/findings for description). There is moderate  concentric left ventricular hypertrophy. Left ventricular diastolic  parameters are consistent with Grade III diastolic dysfunction  (restrictive).  2. Right ventricular systolic function is normal. The right ventricular  size is normal. There is mildly elevated pulmonary artery systolic  pressure.  3. Left atrial size was moderately dilated.  4. Right atrial size was mildly dilated.  5. Largest dimension 3.4 cm posterior to RA without evidence of  tamponade.. Large pericardial effusion. The pericardial effusion is  circumferential. There is no evidence of cardiac tamponade.  6. The mitral valve is normal in structure. Trivial mitral valve  regurgitation. No evidence of mitral stenosis.  7. The aortic valve is tricuspid. Aortic valve regurgitation is not  visualized. No aortic stenosis is present.  8. The inferior vena cava is dilated in size with <50% respiratory  variability, suggesting right atrial pressure of 15 mmHg.   [place] 03/14/20:   Previously placed Mid RCA to Dist RCA stent (unknown type) is widely patent.  Prox Cx lesion is 50% stenosed.  Mid RCA lesion is 40% stenosed.  Mid LAD-1 lesion is 50% stenosed.  Mid LAD-2 lesion is 100% stenosed with 100% stenosed side branch in 2nd Sept.  Mid LAD-3 lesion is 100% stenosed.   IMPRESSION: [name] has a total LAD in the midportion without  collaterals.  He had ongoing chest pain for several days prior to admission suggesting that he had completed an out of hospital anterior STEMI.  He has Q waves across his precordium and troponins which are flat.  Given the fact that there were no collaterals I suspect that he has no viable myocardium beyond his occluded mid LAD and therefore no benefit would be derived from opening this diffusely diseased vessel.  2D echo is pending.  Recommend medical therapy.  If he has ongoing pain and an akinetic anterior wall we could consider doing a  viability cardiac MRI.  Depending on his LV function, if his EF is less than 35% he may benefit from a LifeVest.     Patient Profile     64 y.o. male with CAD s/p PCI 2012 and 3419, chronic diastolic heart failure, recurrent pericardial effusion s/p pericardial window x2, GERD, HTN, LH, CKD III, and OSA admitted with NSTEMI.  Assessment & Plan    # NSTEMI:  # HL:  Mr. Liam Graham likely had an out of hospital STEMI.  Patient presented with 4 days of CP.  HS troponin 144-->118-->149.  He was found to have an occluded LAD with extensive disease.  Given that his EKG revealed Q waves and his late presentation this was thought to be a completed MI.  He is not currently having chest pain or pressure.  We will manage medically.  Echo this admission revealed LVEF 40 to 45% with wall motion abnormalities consistent with LAD infarct and grade 3 diastolic dysfunction.  He has not had any recurrent chest pain since stopping IV nitroglycerin and resuming his home Imdur.  Continue aspirin and clopidogrel was added this admission.  We will plan to treat with dual antiplatelet therapy for 1 year.  He is intolerant of statins.  Has been seen by lipid clinic.  He has been approved by [employer].  We will start this at discharge.  Continue metoprolol.  # Acute on chronic systolic and diastolic heart failure: # CKD 3:  LVEF 40-45%. Right atrial pressure was 15 mmHg.  This was only slightly  reduced from 45 to 50% in March 2021.  He does still have a large pericardial effusion.  There is no evidence of tamponade.  Home diuretics and ARB were held in preparation for cardiac cath.  We attempted to diurese with Lasix but he did not have any output with high doses including adding metolazone.  He was switched to torsemide and diuresed very well.  He was net -4 L yesterday.  BMP is pending.  He is back to his baseline weight.  Plan to resume torsemide at 40mg  po daily.  If renal function is stable, resume home losartan 25 mg daily.  He will need follow up next week and a BMP at that time.  We will walk him to see if he still needs supplemental oxygen.  # Recurrent pericardial effusions: Slightly larger on echo this admission then 10/2019.  No evidence of tamponade.  He is already had 2 pericardial windows.  Resume diuretics.  He does not have any symptoms of pericarditis.    # DM:  Appreciate diabetes management recommendations.  Will adjust insulin as recommended.   # Morbid obesity:  Cardiac rehab at discharge.  # Gout:  R hand gout.  Continue allopurinol and improved after adding colchicine.    For questions or updates, please contact [place] Please consult [email or web address] for contact info under        Signed, Skeet Latch, MD  03/18/2020, 9:42 AM

## 2020-03-18 NOTE — Discharge Summary (Signed)
Discharge Summary    Patient ID: Eugene [name] MRN: [id number]; DOB: [birth year]  Admit date: 03/12/2020 Discharge date: 03/18/2020  Primary Care Provider: [name], MD  Primary Cardiologist: Skeet Latch, MD  Primary Electrophysiologist:  None   Discharge Diagnoses    Principal Problem:   NSTEMI (non-ST elevated myocardial infarction) [facility]) Active Problems:   CAD- MI/RCA PCI-DES x2 2012, and RCA DES Jan 8676   Acute diastolic CHF (congestive heart failure) ([place])   Acute on chronic renal insufficiency   Essential hypertension   Hyperlipidemia   Obstructive sleep apnea- on C-pap   Type 2 diabetes mellitus with neurological manifestations ([place])   Chronic respiratory failure ([place])    Diagnostic Studies/Procedures    Coronary angiogram 03/14/2020-  Previously placed Mid RCA to Dist RCA stent (unknown type) is widely patent.  Prox Cx lesion is 50% stenosed.  Mid RCA lesion is 40% stenosed.  Mid LAD-1 lesion is 50% stenosed.  Mid LAD-2 lesion is 100% stenosed with 100% stenosed side branch in 2nd Sept.  Mid LAD-3 lesion is 100% stenosed.   Plan medical Rx after out of hospital NSTEMI secondary to mLAD occlusion.   Echo cardiogram 03/14/2020- Left ventricular ejection fraction, by estimation, is 40 to 45%. The left ventricle has mildly decreased function. The left ventricle demonstrates regional wall motion abnormalities (see scoring diagram/findings for description). There is moderate concentric left ventricular hypertrophy. Left ventricular diastolic parameters are consistent with Grade III diastolic dysfunction (restrictive). 2. Right ventricular systolic function is normal. The right ventricular size is normal. There is mildly elevated pulmonary artery systolic pressure. 3. Left atrial size was moderately dilated. 4. Right atrial size was mildly dilated. 5. Largest dimension 3.4 cm posterior to RA without evidence of tamponade.. Large  pericardial effusion. The pericardial effusion is circumferential. There is no evidence of cardiac tamponade. 6. The mitral valve is normal in structure. Trivial mitral valve regurgitation. No evidence of mitral stenosis. 7. The aortic valve is tricuspid. Aortic valve regurgitation is not visualized. No aortic stenosis is present. 8. The inferior vena cava is dilated in size with <50% respiratory variability, suggesting right atrial pressure of 15 mmHg.  _____________   History of Present Illness     Eugene [name] is a 64 y.o. male with a history of CAD s/p PCI, chronic diastolic heart failure, recurrent pericardial effusion s/p pericardial window in 2014 and Feb 2020 with residual moderate pericardial effusion without tamponade,, HTN, diabetes, CKD III, and OSA admitted 7/19/2021with an out of hospital NSTEMI,    Eugene [name] is a 64 y.o. male with a history of CAD s/p PCI 2012 and 7209, chronic diastolic heart failure, recurrent pericardial effusion s/p pericardial window in 2014 and Feb 2020 with residual moderate pericardial effusion without tamponade,, HTN, diabetes, CKD III, and OSA admitted 7/19/2021with an out of hospital NSTEMI, estimated to be 10 days old.  His Troponin peaked at 149.  Echo showed his EF to be lower 40-45% than previous echo from March 2021- 45-50%.  Cath showed an occluded mid LAD, patent RCA stent.  His WMA on echo was c/w his cath anatomy.  Plan is for medical Rx.   Plavix added for one year to aspirin, Lopressor changed to Toprol, diuretics increased, and Repatha started (pt was previously seen by Dr Debara Pickett and reportedly is pre approved).  He did diurese 6 lbs - DC weight 252 lbs.  O2 sat was added at discharge after his ambulating O2  sat dropped to 85% on room air.  On 2L his O2 sat came up to 95%.  early f/u to be arranged.  30 Rx called in to his pharmacy in [place], long term Rx sent to his mail order pharmacy. At follow up he  will need repeat BMP.   Did the patient have an acute coronary syndrome (MI, NSTEMI, STEMI, etc) this admission?:  Yes                               AHA/ACC Clinical Performance & Quality Measures: 1. Aspirin prescribed? - Yes 2. ADP Receptor Inhibitor (Plavix/Clopidogrel, Brilinta/Ticagrelor or Effient/Prasugrel) prescribed (includes medically managed patients)? - Yes 3. Beta Blocker prescribed? - Yes 4. High Intensity Statin (Lipitor 40-80mg  or Crestor 20-40mg ) prescribed? - No - Repatha prescibed- pt is statin intolerant 5. EF assessed during THIS hospitalization? - Yes 6. For EF <40%, was ACEI/ARB prescribed? - Not Applicable (EF >/= 46%) 7. For EF <40%, Aldosterone Antagonist (Spironolactone or Eplerenone) prescribed? - Not Applicable (EF >/= 27%) 8. Cardiac Rehab Phase II ordered (including medically managed patients)? - Yes   _____________  Discharge Vitals Blood pressure (!) 127/62, pulse 76, temperature 98.9 F (37.2 C), temperature source Oral, resp. rate 18, height 6\' 1"  (1.854 m), weight (!) 114.5 kg, SpO2 95 %.  Filed Weights   03/16/20 0643 03/17/20 0315 03/18/20 0631  Weight: 118.2 kg 118.6 kg (!) 114.5 kg    Labs & Radiologic Studies    CBC Recent Labs    03/16/20 0246 03/17/20 0224  WBC 5.4 4.0  HGB 9.3* 9.3*  HCT 30.2* 30.0*  MCV 92.6 92.0  PLT 159 035   Basic Metabolic Panel Recent Labs    03/17/20 1420 03/18/20 1024  NA 137 135  K 5.1 4.9  CL 99 96*  CO2 29 27  GLUCOSE 155* 143*  BUN 58* 66*  CREATININE 1.95* 1.92*  CALCIUM 9.2 9.5   Liver Function Tests No results for input(s): AST, ALT, ALKPHOS, BILITOT, PROT, ALBUMIN in the last 72 hours. No results for input(s): LIPASE, AMYLASE in the last 72 hours. High Sensitivity Troponin:   Recent Labs  Lab 03/12/20 0257 03/12/20 0537 03/13/20 0001 03/13/20 0645  TROPONINIHS 148* 144* 118* 149*    BNP Invalid input(s): POCBNP D-Dimer No results for input(s): DDIMER in the last 72  hours. Hemoglobin A1C No results for input(s): HGBA1C in the last 72 hours. Fasting Lipid Panel No results for input(s): CHOL, HDL, LDLCALC, TRIG, CHOLHDL, LDLDIRECT in the last 72 hours. Thyroid Function Tests No results for input(s): TSH, T4TOTAL, T3FREE, THYROIDAB in the last 72 hours.  Invalid input(s): FREET3 _____________  CARDIAC CATHETERIZATION  Result Date: 03/14/2020  Previously placed Mid RCA to Dist RCA stent (unknown type) is widely patent.  Prox Cx lesion is 50% stenosed.  Mid RCA lesion is 40% stenosed.  Mid LAD-1 lesion is 50% stenosed.  Mid LAD-2 lesion is 100% stenosed with 100% stenosed side branch in 2nd Sept.  Mid LAD-3 lesion is 100% stenosed.  Eugene [name] is a 64 y.o. male  [id number] LOCATION:  FACILITY: [facility] PHYSICIAN: Quay Burow, M.D. Apr 15, 1956 DATE OF PROCEDURE:  03/14/2020 DATE OF DISCHARGE: CARDIAC CATHETERIZATION History obtained from chart review.64 y.o. male with CAD s/p PCI 2012 and 9371, chronic diastolic heart failure, recurrent pericardial effusion s/p pericardial window x2, GERD, HTN, LH, CKD III, and OSA admitted with NSTEMI.   [name] has a total  LAD in the midportion without collaterals.  He had ongoing chest pain for several days prior to admission suggesting that he had completed an out of hospital anterior STEMI.  He has Q waves across his precordium and troponins which are flat.  Given the fact that there were no collaterals I suspect that he has no viable myocardium beyond his occluded mid LAD and therefore no benefit would be derived from opening this diffusely diseased vessel.  2D echo is pending.  Recommend medical therapy.  If he has ongoing pain and an akinetic anterior wall we could consider doing a viability cardiac MRI.  Depending on his LV function, if his EF is less than 35% he may benefit from a LifeVest.  The sheath was removed and a TR band was placed on the right wrist to achieve patent hemostasis.  The patient left lab in  stable condition. Quay Burow. MD, [facility] 03/14/2020 10:38 AM   DG Chest Portable 1 View  Result Date: 03/12/2020 CLINICAL DATA:  Chest pain x2 days. EXAM: PORTABLE CHEST 1 VIEW COMPARISON:  February 22, 2019 FINDINGS: Mild, diffusely increased interstitial lung markings are seen. There is no evidence of focal consolidation, pleural effusion or pneumothorax. The cardiac silhouette is markedly enlarged and unchanged in size. The visualized skeletal structures are unremarkable. IMPRESSION: 1. Mild, diffusely increased interstitial lung markings, likely consistent with mild interstitial edema. 2. Stable cardiomegaly. Electronically Signed   By: Virgina Norfolk M.D.   On: 03/12/2020 03:56   ECHOCARDIOGRAM COMPLETE  Result Date: 03/14/2020    ECHOCARDIOGRAM REPORT   Patient Name:   Eugene [name] [facility] Date of Exam: 03/14/2020 Medical Rec #:  [id number]           Height:       73.0 in Accession #:    [id number]          Weight:       258.8 lb Date of Birth:  [birth year]           BSA:          2.401 m Patient Age:    64 years            BP:           135/82 mmHg Patient Gender: M                   HR:           80 bpm. Exam Location:  Inpatient Procedure: 2D Echo, Cardiac Doppler and Color Doppler Indications:    Chest pain  History:        Patient has prior history of Echocardiogram examinations, most                 recent 05/19/2020. CHF, Previous Myocardial Infarction,                 Pericardial Disease and CAD, Signs/Symptoms:Chest Pain; Risk                 Factors:Diabetes and Obesity.  Sonographer:    Dustin Flock Referring Phys: [id number] PETER M [place]  Sonographer Comments: Patient is morbidly obese. Image acquisition challenging due to patient body habitus. IMPRESSIONS  1. Left ventricular ejection fraction, by estimation, is 40 to 45%. The left ventricle has mildly decreased function. The left ventricle demonstrates regional wall motion abnormalities (see scoring diagram/findings for description). There is  moderate concentric left ventricular hypertrophy. Left ventricular diastolic parameters are consistent with Grade III diastolic dysfunction (restrictive).  2. Right ventricular systolic function is normal. The right ventricular size is normal. There is mildly elevated pulmonary artery systolic pressure.  3. Left atrial size was moderately dilated.  4. Right atrial size was mildly dilated.  5. Largest dimension 3.4 cm posterior to RA without evidence of tamponade.. Large pericardial effusion. The pericardial effusion is circumferential. There is no evidence of cardiac tamponade.  6. The mitral valve is normal in structure. Trivial mitral valve regurgitation. No evidence of mitral stenosis.  7. The aortic valve is tricuspid. Aortic valve regurgitation is not visualized. No aortic stenosis is present.  8. The inferior vena cava is dilated in size with <50% respiratory variability, suggesting right atrial pressure of 15 mmHg. Comparison(s): Changes from prior study are noted. Conclusion(s)/Recommendation(s): While there was some mild hypokinesis of the mid to distal anterior wall before, it appears more pronounced on today's study. EF slightly reduced compared to prior. Effusion seen on prior studies, no evidence of tamponade. FINDINGS  Left Ventricle: Left ventricular ejection fraction, by estimation, is 40 to 45%. The left ventricle has mildly decreased function. The left ventricle demonstrates regional wall motion abnormalities. The left ventricular internal cavity size was normal in size. There is moderate concentric left ventricular hypertrophy. Left ventricular diastolic parameters are consistent with Grade III diastolic dysfunction (restrictive).  LV Wall Scoring: The apex is akinetic. The mid and distal anterior wall, mid and distal anterior septum, apical lateral segment, mid anterolateral segment, mid inferoseptal segment, and apical inferior segment are hypokinetic. The inferior wall, posterior wall, basal  anteroseptal segment, basal anterolateral segment, basal anterior segment, and basal inferoseptal segment are normal. Right Ventricle: The right ventricular size is normal. Right vetricular wall thickness was not assessed. Right ventricular systolic function is normal. There is mildly elevated pulmonary artery systolic pressure. The tricuspid regurgitant velocity is 2.67 m/s, and with an assumed right atrial pressure of 15 mmHg, the estimated right ventricular systolic pressure is 01.6 mmHg. Left Atrium: Left atrial size was moderately dilated. Right Atrium: Right atrial size was mildly dilated. Pericardium: Largest dimension 3.4 cm posterior to RA without evidence of tamponade. A large pericardial effusion is present. The pericardial effusion is circumferential. There is no evidence of cardiac tamponade. Mitral Valve: The mitral valve is normal in structure. Trivial mitral valve regurgitation. No evidence of mitral valve stenosis. Tricuspid Valve: The tricuspid valve is normal in structure. Tricuspid valve regurgitation is trivial. No evidence of tricuspid stenosis. Aortic Valve: The aortic valve is tricuspid. Aortic valve regurgitation is not visualized. No aortic stenosis is present. Pulmonic Valve: The pulmonic valve was not well visualized. Pulmonic valve regurgitation is not visualized. Aorta: The aortic root and ascending aorta are structurally normal, with no evidence of dilitation. Venous: The inferior vena cava is dilated in size with less than 50% respiratory variability, suggesting right atrial pressure of 15 mmHg. IAS/Shunts: The atrial septum is grossly normal.  LEFT VENTRICLE PLAX 2D LVIDd:         5.10 cm  Diastology LVIDs:         3.20 cm  LV e' lateral:   8.19 cm/s LV PW:         1.50 cm  LV E/e' lateral: 15.8 LV IVS:        1.80 cm  LV e' medial:    3.57 cm/s LVOT diam:     2.50 cm  LV E/e' medial:  36.1 LV SV:         96 LV SV Index:   40  LVOT Area:     4.91 cm  RIGHT VENTRICLE RV Basal diam:   3.60 cm RV S prime:     5.68 cm/s TAPSE (M-mode): 3.4 cm LEFT ATRIUM             Index       RIGHT ATRIUM           Index LA diam:        4.90 cm 2.04 cm/m  RA Area:     19.00 cm LA Vol (A2C):   92.3 ml 38.45 ml/m RA Volume:   55.10 ml  22.95 ml/m LA Vol (A4C):   95.2 ml 39.65 ml/m LA Biplane Vol: 96.6 ml 40.24 ml/m  AORTIC VALVE LVOT Vmax:   117.00 cm/s LVOT Vmean:  77.200 cm/s LVOT VTI:    0.195 m  AORTA Ao Root diam: 3.50 cm MITRAL VALVE                TRICUSPID VALVE MV Area (PHT): 7.99 cm     TR Peak grad:   28.5 mmHg MV Decel Time: 95 msec      TR Vmax:        267.00 cm/s MV E velocity: 129.00 cm/s MV A velocity: 55.10 cm/s   SHUNTS MV E/A ratio:  2.34         Systemic VTI:  0.20 m                             Systemic Diam: 2.50 cm Buford Dresser MD Electronically signed by Buford Dresser MD Signature Date/Time: 03/14/2020/12:26:30 PM    Final    Disposition   Pt is being discharged home today in good condition.  Follow-up Plans & Appointments     Follow-up Information    Erlene Quan, PA-C Follow up on 03/28/2020.   Specialties: Cardiology, Radiology Why: Come at 12 noon Contact information: [place] [address] [phone]                Discharge Medications   Allergies as of 03/18/2020      Reactions   Hydrocodone Nausea And Vomiting   Lisinopril Cough   Neurontin [gabapentin] Other (See Comments)   Reaction:  Suicidal thoughts    Statins Other (See Comments)   Reaction:  Muscle pain    Metformin And Related Diarrhea   Norvasc [amlodipine Besylate] Swelling, Other (See Comments)   Reaction:  Pedal edema      Medication List    STOP taking these medications   metoprolol tartrate 25 MG tablet Commonly known as: LOPRESSOR     TAKE these medications   acetaminophen 500 MG tablet Commonly known as: TYLENOL Take 1,500 mg by mouth every 6 (six) hours as needed for mild pain, fever or headache.   allopurinol 100 MG  tablet Commonly known as: ZYLOPRIM Take 100 mg by mouth as needed.   ALPRAZolam 0.5 MG tablet Commonly known as: XANAX TAKE 1-2 TABLETS (0.5-1 MG TOTAL) BY MOUTH AT BEDTIME AS NEEDED FOR SLEEP.   aspirin 81 MG chewable tablet Chew 81 mg by mouth at bedtime.   clopidogrel 75 MG tablet Commonly known as: PLAVIX Take 1 tablet (75 mg total) by mouth daily. Start taking on: March 19, 2020   colchicine 0.6 MG tablet 1 twice daily as needed for gout flareup   glimepiride 1 MG tablet Commonly known as: AMARYL TAKE 1 TABLET WITH MEALS AS  DIRECTED. MAX 3 TABLETS PER DAY   isosorbide mononitrate 60 MG 24 hr tablet Commonly known as: IMDUR Take 1 tablet (60 mg total) by mouth daily.   Levemir FlexTouch 100 UNIT/ML FlexPen Generic drug: insulin detemir INJECT 80 UNITS INTO THE SKIN DAILY AT 10 PM. What changed: See the new instructions.   losartan 25 MG tablet Commonly known as: COZAAR TAKE 1 TABLET (25 MG TOTAL) BY MOUTH DAILY.   metoprolol succinate 50 MG 24 hr tablet Commonly known as: TOPROL-XL Take 1 tablet (50 mg total) by mouth daily. Take with or immediately following a meal. Start taking on: March 19, 2020   nitroGLYCERIN 0.4 MG SL tablet Commonly known as: NITROSTAT Place 1 tablet (0.4 mg total) under the tongue every 5 (five) minutes as needed for chest pain.   ofloxacin 0.3 % OTIC solution Commonly known as: FLOXIN 5 drops as needed.   Repatha 140 MG/ML [place] Generic drug: Evolocumab Inject 140 mg into the skin every 14 (fourteen) days.   torsemide 20 MG tablet Commonly known as: DEMADEX Take 3 tablets (60 mg total) by mouth 2 (two) times daily. What changed: See the new instructions.   traMADol 50 MG tablet Commonly known as: ULTRAM TAKE 1 TABLET (50 MG TOTAL) BY MOUTH EVERY 6 (SIX) HOURS AS NEEDED            Durable Medical Equipment  (From admission, onward)         Start     Ordered   03/18/20 1131  For home use only DME oxygen  Once        Question Answer Comment  Length of Need 6 Months   Mode or (Route) Nasal cannula   Liters per Minute 2   Frequency Continuous (stationary and portable oxygen unit needed)   Oxygen delivery system Gas      03/18/20 1131             Outstanding Labs/Studies     Duration of Discharge Encounter   Greater than 30 minutes including physician time.  Angelena Form, PA-C 03/18/2020, 12:56 PM

## 2020-03-18 NOTE — Progress Notes (Signed)
Discharged home accompanied by wife . Discharged instructions given to pt.Belongings taken home

## 2020-03-18 NOTE — Discharge Instructions (Signed)
Angina ° °Angina is very bad discomfort or pain in the chest, neck, arm, jaw, or back. The discomfort is caused by a lack of blood in the middle layer of the heart wall (myocardium). °What are the causes? °This condition is caused by a buildup of fat and cholesterol (plaque) in your arteries (atherosclerosis). This buildup narrows the arteries and makes it hard for blood to flow. °What increases the risk? °You are more likely to develop this condition if: °· You have high levels of cholesterol in your blood. °· You have high blood pressure (hypertension). °· You have diabetes. °· You have a family history of heart disease. °· You are not active, or you do not exercise enough. °· You feel sad (depressed). °· You have been treated with high energy rays (radiation) on the left side of your chest. °Other risk factors are: °· Using tobacco. °· Being very overweight (obese). °· Eating a diet high in unhealthy fats (saturated fats). °· Having stress, or being exposed to things that cause stress. °· Using drugs, such as cocaine. °Women have a greater risk for angina if: °· They are older than 55. °· They have stopped having their period (are in postmenopause). °What are the signs or symptoms? °Common symptoms of this condition in both men and women may include: °· Chest pain, which may: °? Feel like a crushing or squeezing in the chest. °? Feel like a tightness, pressure, fullness, or heaviness in the chest. °? Last for more than a few minutes at a time. °? Stop and come back (recur) after a few minutes. °· Pain in the neck, arm, jaw, or back. °· Heartburn or upset stomach (indigestion) for no reason. °· Being short of breath. °· Feeling sick to your stomach (nauseous). °· Sudden cold sweats. °Women and people with diabetes may have other symptoms that are not usual, such as feeling: °· Tired (fatigue). °· Worried or nervous (anxious) for no reason. °· Weak for no reason. °· Dizzy or passing out (fainting). °How is this  treated? °This condition may be treated with: °· Medicines. These are given to: °? Prevent blood clots. °? Prevent heart attack. °? Relax blood vessels and improve blood flow to the heart (nitrates). °? Reduce blood pressure. °? Improve the pumping action of the heart. °? Reduce fat and cholesterol in the blood. °· A procedure to widen a narrowed or blocked artery in the heart (angioplasty). °· Surgery to allow blood to go around a blocked artery (coronary artery bypass surgery). °Follow these instructions at home: °Medicines °· Take over-the-counter and prescription medicines only as told by your doctor. °· Do not take these medicines unless your doctor says that you can: °? NSAIDs. These include: °§ Ibuprofen. °§ Naproxen. °? Vitamin supplements that have vitamin A, vitamin E, or both. °? Hormone therapy that contains estrogen with or without progestin. °Eating and drinking ° °· Eat a heart-healthy diet that includes: °? Lots of fresh fruits and vegetables. °? Whole grains. °? Low-fat (lean) protein. °? Low-fat dairy products. °· Follow instructions from your doctor about what you cannot eat or drink. °Activity °· Follow an exercise program that your doctor tells you. °· Talk with your doctor about joining a program to help improve the health of your heart (cardiac rehab). °· When you feel tired, take a break. Plan breaks if you know you are going to feel tired. °Lifestyle ° °· Do not use any products that contain nicotine or tobacco. This includes cigarettes, e-cigarettes, and   and chewing tobacco. If you need help quitting, ask your doctor.  If your doctor says you can drink alcohol: ? Limit how much you use to:  0-1 drink a day for women who are not pregnant.  0-2 drinks a day for men. ? Be aware of how much alcohol is in your drink. In the [place] one drink equals:  One 12 oz bottle of beer (355 mL).  One 5 oz glass of wine (148 mL).  One 1 oz glass of hard liquor (44 mL). General instructions  Stay  at a healthy weight. If your doctor tells you to do so, work with him or her to lose weight.  Learn to deal with stress. If you need help, ask your doctor.  Keep your vaccines up to date. Get a flu shot every year.  Talk with your doctor if you feel sad. Take a screening test to see if you are at risk for depression.  Work with your doctor to manage any other health problems that you have. These may include diabetes or high blood pressure.  Keep all follow-up visits as told by your doctor. This is important. Get help right away if:  You have pain in your chest, neck, arm, jaw, or back, and the pain: ? Lasts more than a few minutes. ? Comes back. ? Does not get better after you take medicine under your tongue (sublingual nitroglycerin). ? Keeps getting worse. ? Comes more often.  You have any of these problems for no reason: ? Sweating a lot. ? Heartburn or upset stomach. ? Shortness of breath. ? Trouble breathing. ? Feeling sick to your stomach. ? Throwing up (vomiting). ? Feeling more tired than normal. ? Feeling nervous or worrying more than normal. ? Weakness.  You are suddenly dizzy or light-headed.  You pass out. These symptoms may be an emergency. Do not wait to see if the symptoms will go away. Get medical help right away. Call your local emergency services (911 in the [place] Do not drive yourself to the hospital. Summary  Angina is very bad discomfort or pain in the chest, neck, arm, neck, or back.  Symptoms include chest pain, heartburn or upset stomach for no reason, and shortness of breath.  Women or people with diabetes may have symptoms that are not usual, such as feeling nervous or worried for no reason, weak for no reason, or tired.  Take all medicines only as told by your doctor.  You should eat a heart-healthy diet and follow an exercise program. This information is not intended to replace advice given to you by your health care provider. Make sure you  discuss any questions you have with your health care provider. Document Revised: 03/31/2018 Document Reviewed: 03/31/2018 Elsevier Patient Education  [place]. Heart Failure, Self Care Heart failure is a serious condition. This sheet explains things you need to do to take care of yourself at home. To help you stay as healthy as possible, you may be asked to change your diet, take certain medicines, and make other changes in your life. Your doctor may also give you more specific instructions. If you have problems or questions, call your doctor. What are the risks? Having heart failure makes it more likely for you to have some problems. These problems can get worse if you do not take good care of yourself. Problems may include:  Blood clotting problems. This may cause a stroke.  Damage to the kidneys, liver, or lungs.  Abnormal heart  rhythms. Supplies needed:  Scale for weighing yourself.  Blood pressure monitor.  Notebook.  Medicines. How to care for yourself when you have heart failure Medicines Take over-the-counter and prescription medicines only as told by your doctor. Take your medicines every day.  Do not stop taking your medicine unless your doctor tells you to do so.  Do not skip any medicines.  Get your prescriptions refilled before you run out of medicine. This is important. Eating and drinking   Eat heart-healthy foods. Talk with a diet specialist (dietitian) to create an eating plan.  Choose foods that: ? Have no trans fat. ? Are low in saturated fat and cholesterol.  Choose healthy foods, such as: ? Fresh or frozen fruits and vegetables. ? Fish. ? Low-fat (lean) meats. ? Legumes, such as beans, peas, and lentils. ? Fat-free or low-fat dairy products. ? Whole-grain foods. ? High-fiber foods.  Limit salt (sodium) if told by your doctor. Ask your diet specialist to tell you which seasonings are healthy for your heart.  Cook in healthy ways instead  of frying. Healthy ways of cooking include roasting, grilling, broiling, baking, poaching, steaming, and stir-frying.  Limit how much fluid you drink, if told by your doctor. Alcohol use  Do not drink alcohol if: ? Your doctor tells you not to drink. ? Your heart was damaged by alcohol, or you have very bad heart failure. ? You are pregnant, may be pregnant, or are planning to become pregnant.  If you drink alcohol: ? Limit how much you use to:  0-1 drink a day for women.  0-2 drinks a day for men. ? Be aware of how much alcohol is in your drink. In the [place] one drink equals one 12 oz bottle of beer (355 mL), one 5 oz glass of wine (148 mL), or one 1 oz glass of hard liquor (44 mL). Lifestyle   Do not use any products that contain nicotine or tobacco, such as cigarettes, e-cigarettes, and chewing tobacco. If you need help quitting, ask your doctor. ? Do not use nicotine gum or patches before talking to your doctor.  Do not use illegal drugs.  Lose weight if told by your doctor.  Do physical activity if told by your doctor. Talk to your doctor before you begin an exercise if: ? You are an older adult. ? You have very bad heart failure.  Learn to manage stress. If you need help, ask your doctor.  Get rehab (rehabilitation) to help you stay independent and to help with your quality of life.  Plan time to rest when you get tired. Check weight and blood pressure   Weigh yourself every day. This will help you to know if fluid is building up in your body. ? Weigh yourself every morning after you pee (urinate) and before you eat breakfast. ? Wear the same amount of clothing each time. ? Write down your daily weight. Give your record to your doctor.  Check and write down your blood pressure as told by your doctor.  Check your pulse as told by your doctor. Dealing with very hot and very cold weather  If it is very hot: ? Avoid activities that take a lot of energy. ? Use air  conditioning or fans, or find a cooler place. ? Avoid caffeine and alcohol. ? Wear clothing that is loose-fitting, lightweight, and light-colored.  If it is very cold: ? Avoid activities that take a lot of energy. ? Layer your clothes. ? Wear mittens  or gloves, a hat, and a scarf when you go outside. ? Avoid alcohol. Follow these instructions at home:  Stay up to date with shots (vaccines). Get pneumococcal and flu (influenza) shots.  Keep all follow-up visits as told by your doctor. This is important. Contact a doctor if:  You gain weight quickly.  You have increasing shortness of breath.  You cannot do your normal activities.  You get tired easily.  You cough a lot.  You don't feel like eating or feel like you may vomit (nauseous).  You become puffy (swell) in your hands, feet, ankles, or belly (abdomen).  You cannot sleep well because it is hard to breathe.  You feel like your heart is beating fast (palpitations).  You get dizzy when you stand up. Get help right away if:  You have trouble breathing.  You or someone else notices a change in your behavior, such as having trouble staying awake.  You have chest pain or discomfort.  You pass out (faint). These symptoms may be an emergency. Do not wait to see if the symptoms will go away. Get medical help right away. Call your local emergency services (911 in the [place] Do not drive yourself to the hospital. Summary  Heart failure is a serious condition. To care for yourself, you may have to change your diet, take medicines, and make other lifestyle changes.  Take your medicines every day. Do not stop taking them unless your doctor tells you to do so.  Eat heart-healthy foods, such as fresh or frozen fruits and vegetables, fish, lean meats, legumes, fat-free or low-fat dairy products, and whole-grain or high-fiber foods.  Ask your doctor if you can drink alcohol. You may have to stop alcohol use if you have very bad  heart failure.  Contact your doctor if you gain weight quickly or feel that your heart is beating too fast. Get help right away if you pass out, or have chest pain or trouble breathing. This information is not intended to replace advice given to you by your health care provider. Make sure you discuss any questions you have with your health care provider. Document Revised: 11/25/2018 Document Reviewed: 11/26/2018 Elsevier Patient Education  [place]. Angina  Angina is very bad discomfort or pain in the chest, neck, arm, jaw, or back. The discomfort is caused by a lack of blood in the middle layer of the heart wall (myocardium). What are the causes? This condition is caused by a buildup of fat and cholesterol (plaque) in your arteries (atherosclerosis). This buildup narrows the arteries and makes it hard for blood to flow. What increases the risk? You are more likely to develop this condition if:  You have high levels of cholesterol in your blood.  You have high blood pressure (hypertension).  You have diabetes.  You have a family history of heart disease.  You are not active, or you do not exercise enough.  You feel sad (depressed).  You have been treated with high energy rays (radiation) on the left side of your chest. Other risk factors are:  Using tobacco.  Being very overweight (obese).  Eating a diet high in unhealthy fats (saturated fats).  Having stress, or being exposed to things that cause stress.  Using drugs, such as cocaine. Women have a greater risk for angina if:  They are older than 52.  They have stopped having their period (are in postmenopause). What are the signs or symptoms? Common symptoms of this condition in  both men and women may include:  Chest pain, which may: ? Feel like a crushing or squeezing in the chest. ? Feel like a tightness, pressure, fullness, or heaviness in the chest. ? Last for more than a few minutes at a time. ? Stop  and come back (recur) after a few minutes.  Pain in the neck, arm, jaw, or back.  Heartburn or upset stomach (indigestion) for no reason.  Being short of breath.  Feeling sick to your stomach (nauseous).  Sudden cold sweats. Women and people with diabetes may have other symptoms that are not usual, such as feeling:  Tired (fatigue).  Worried or nervous (anxious) for no reason.  Weak for no reason.  Dizzy or passing out (fainting). How is this treated? This condition may be treated with:  Medicines. These are given to: ? Prevent blood clots. ? Prevent heart attack. ? Relax blood vessels and improve blood flow to the heart (nitrates). ? Reduce blood pressure. ? Improve the pumping action of the heart. ? Reduce fat and cholesterol in the blood.  A procedure to widen a narrowed or blocked artery in the heart (angioplasty).  Surgery to allow blood to go around a blocked artery (coronary artery bypass surgery). Follow these instructions at home: Medicines  Take over-the-counter and prescription medicines only as told by your doctor.  Do not take these medicines unless your doctor says that you can: ? NSAIDs. These include:  Ibuprofen.  Naproxen. ? Vitamin supplements that have vitamin A, vitamin E, or both. ? Hormone therapy that contains estrogen with or without progestin. Eating and drinking   Eat a heart-healthy diet that includes: ? Lots of fresh fruits and vegetables. ? Whole grains. ? Low-fat (lean) protein. ? Low-fat dairy products.  Follow instructions from your doctor about what you cannot eat or drink. Activity  Follow an exercise program that your doctor tells you.  Talk with your doctor about joining a program to help improve the health of your heart (cardiac rehab).  When you feel tired, take a break. Plan breaks if you know you are going to feel tired. Lifestyle   Do not use any products that contain nicotine or tobacco. This includes  cigarettes, e-cigarettes, and chewing tobacco. If you need help quitting, ask your doctor.  If your doctor says you can drink alcohol: ? Limit how much you use to:  0-1 drink a day for women who are not pregnant.  0-2 drinks a day for men. ? Be aware of how much alcohol is in your drink. In the [place] one drink equals:  One 12 oz bottle of beer (355 mL).  One 5 oz glass of wine (148 mL).  One 1 oz glass of hard liquor (44 mL). General instructions  Stay at a healthy weight. If your doctor tells you to do so, work with him or her to lose weight.  Learn to deal with stress. If you need help, ask your doctor.  Keep your vaccines up to date. Get a flu shot every year.  Talk with your doctor if you feel sad. Take a screening test to see if you are at risk for depression.  Work with your doctor to manage any other health problems that you have. These may include diabetes or high blood pressure.  Keep all follow-up visits as told by your doctor. This is important. Get help right away if:  You have pain in your chest, neck, arm, jaw, or back, and the pain: ? Lasts  more than a few minutes. ? Comes back. ? Does not get better after you take medicine under your tongue (sublingual nitroglycerin). ? Keeps getting worse. ? Comes more often.  You have any of these problems for no reason: ? Sweating a lot. ? Heartburn or upset stomach. ? Shortness of breath. ? Trouble breathing. ? Feeling sick to your stomach. ? Throwing up (vomiting). ? Feeling more tired than normal. ? Feeling nervous or worrying more than normal. ? Weakness.  You are suddenly dizzy or light-headed.  You pass out. These symptoms may be an emergency. Do not wait to see if the symptoms will go away. Get medical help right away. Call your local emergency services (911 in the [place] Do not drive yourself to the hospital. Summary  Angina is very bad discomfort or pain in the chest, neck, arm, neck, or  back.  Symptoms include chest pain, heartburn or upset stomach for no reason, and shortness of breath.  Women or people with diabetes may have symptoms that are not usual, such as feeling nervous or worried for no reason, weak for no reason, or tired.  Take all medicines only as told by your doctor.  You should eat a heart-healthy diet and follow an exercise program. This information is not intended to replace advice given to you by your health care provider. Make sure you discuss any questions you have with your health care provider. Document Revised: 03/31/2018 Document Reviewed: 03/31/2018 Elsevier Patient Education  [place].

## 2020-03-18 NOTE — Progress Notes (Signed)
SATURATION QUALIFICATIONS: (This note is used to comply with regulatory documentation for home oxygen)  Patient Saturations on Room Air at Rest =92  Patient Saturations on Room Air while Ambulating = 85%  Patient Saturations on  2 Liters of oxygen while Ambulating = 95%  Please briefly explain why patient needs home oxygen:needed oxygen on ambulation, complained of slight sob on RA on ambulation.

## 2020-03-18 NOTE — Plan of Care (Signed)
  Problem: Education: Goal: Knowledge of General Education information will improve Description Including pain rating scale, medication(s)/side effects and non-pharmacologic comfort measures Outcome: Progressing   Problem: Health Behavior/Discharge Planning: Goal: Ability to manage health-related needs will improve Outcome: Progressing   Problem: Clinical Measurements: Goal: Ability to maintain clinical measurements within normal limits will improve Outcome: Progressing Goal: Will remain free from infection Outcome: Progressing Goal: Diagnostic test results will improve Outcome: Progressing Goal: Respiratory complications will improve Outcome: Progressing Goal: Cardiovascular complication will be avoided Outcome: Progressing   Problem: Activity: Goal: Risk for activity intolerance will decrease Outcome: Progressing   Problem: Nutrition: Goal: Adequate nutrition will be maintained Outcome: Progressing   Problem: Coping: Goal: Level of anxiety will decrease Outcome: Progressing   Problem: Elimination: Goal: Will not experience complications related to bowel motility Outcome: Progressing Goal: Will not experience complications related to urinary retention Outcome: Progressing   Problem: Pain Managment: Goal: General experience of comfort will improve Outcome: Progressing   Problem: Skin Integrity: Goal: Risk for impaired skin integrity will decrease Outcome: Progressing   Problem: Education: Goal: Understanding of CV disease, CV risk reduction, and recovery process will improve Outcome: Progressing Goal: Individualized Educational Video(s) Outcome: Progressing   Problem: Activity: Goal: Ability to return to baseline activity level will improve Outcome: Progressing   Problem: Cardiovascular: Goal: Ability to achieve and maintain adequate cardiovascular perfusion will improve Outcome: Progressing Goal: Vascular access site(s) Level 0-1 will be  maintained Outcome: Progressing   Problem: Health Behavior/Discharge Planning: Goal: Ability to safely manage health-related needs after discharge will improve Outcome: Progressing   

## 2020-03-18 NOTE — TOC Transition Note (Signed)
Transition of Care [facility]) - CM/SW Discharge Note   Patient Details  Name: Eugene [name] MRN: [id number] Date of Birth: [birth year]  Transition of Care [facility]) CM/SW Contact:  Zenon Mayo, RN Phone Number: 03/18/2020, 2:02 PM   Clinical Narrative:    Patient for dc today, needing home oxygen, NCM contacted Zach with [employer] on 7/22 to inform, order put in for oxygen on 7/23.     Final next level of care: Home/Self Care Barriers to Discharge: No Barriers Identified   Patient Goals and CMS Choice        Discharge Placement                       Discharge Plan and Services                DME Arranged: Oxygen DME Agency: [facility] Date DME Agency Contacted: 03/17/20 Time DME Agency Contacted: 1500 Representative spoke with at DME Agency: [place] ([place]) Interventions     Readmission Risk Interventions No flowsheet data found.

## 2020-03-21 ENCOUNTER — Other Ambulatory Visit: Payer: Self-pay

## 2020-03-21 ENCOUNTER — Encounter (INDEPENDENT_AMBULATORY_CARE_PROVIDER_SITE_OTHER): Payer: Medicare HMO | Admitting: Ophthalmology

## 2020-03-21 DIAGNOSIS — D3132 Benign neoplasm of left choroid: Secondary | ICD-10-CM

## 2020-03-21 DIAGNOSIS — H35033 Hypertensive retinopathy, bilateral: Secondary | ICD-10-CM

## 2020-03-21 DIAGNOSIS — I10 Essential (primary) hypertension: Secondary | ICD-10-CM

## 2020-03-21 DIAGNOSIS — E113311 Type 2 diabetes mellitus with moderate nonproliferative diabetic retinopathy with macular edema, right eye: Secondary | ICD-10-CM

## 2020-03-21 DIAGNOSIS — H43813 Vitreous degeneration, bilateral: Secondary | ICD-10-CM | POA: Diagnosis not present

## 2020-03-21 DIAGNOSIS — E113512 Type 2 diabetes mellitus with proliferative diabetic retinopathy with macular edema, left eye: Secondary | ICD-10-CM

## 2020-03-21 DIAGNOSIS — E11311 Type 2 diabetes mellitus with unspecified diabetic retinopathy with macular edema: Secondary | ICD-10-CM | POA: Diagnosis not present

## 2020-03-21 NOTE — Patient Outreach (Signed)
[facility]) Care Management  03/21/2020  [place] Oct 31, 1955 [id number]   Referral Date: 03/21/20 Referral Source: Hospital Liaison Referral Reason: Post Hospitalization follow up   Outreach Attempt: No answer.  HIPAA compliant voice message left.   Plan: RN CM will attempt again within 4 business days and send letter.    Jone Baseman, RN, MSN [facility] Care Management Care Management Coordinator Direct Line [phone] Toll Free: [phone]  Fax: [phone]

## 2020-03-22 ENCOUNTER — Other Ambulatory Visit: Payer: Self-pay | Admitting: Family Medicine

## 2020-03-22 ENCOUNTER — Other Ambulatory Visit: Payer: Self-pay

## 2020-03-22 NOTE — Patient Outreach (Signed)
[name]) Care Management  03/22/2020  [place] Apr 26, 1956 [id number]   Referral Date: 03/21/20 Referral Source: Hospital Liaison Referral Reason: Post Hospitalization follow up   Outreach Attempt: spoke with patient and he reports he is doing well. Discussed [facility] services and support.  Patient states he is very familiar with [facility] services.  He feels that he is managing well and has declined services at this time.  However, patient agreeable to receive letter and brochure for future reference.    Plan: RN CM will close case and send letter.    Jone Baseman, RN, MSN [facility] Care Management Coordinator Direct Line [phone] Cell [phone] Toll Free: [phone]  Fax: [phone]

## 2020-03-22 NOTE — Telephone Encounter (Signed)
Patient aware med has been approved - started on medication, Rx'ed by [place] [facility] on 7/23. He has tolerated first injection well. Advised how to apply for [email or web address] grant.

## 2020-03-23 DIAGNOSIS — I509 Heart failure, unspecified: Secondary | ICD-10-CM | POA: Diagnosis not present

## 2020-03-23 DIAGNOSIS — I5023 Acute on chronic systolic (congestive) heart failure: Secondary | ICD-10-CM | POA: Diagnosis not present

## 2020-03-23 DIAGNOSIS — I5033 Acute on chronic diastolic (congestive) heart failure: Secondary | ICD-10-CM | POA: Diagnosis not present

## 2020-03-24 ENCOUNTER — Telehealth: Payer: Self-pay | Admitting: Family Medicine

## 2020-03-24 ENCOUNTER — Encounter: Payer: Self-pay | Admitting: Family Medicine

## 2020-03-24 NOTE — Telephone Encounter (Signed)
Please advise. Thank you

## 2020-03-24 NOTE — Telephone Encounter (Signed)
Addendum was placed, form was signed

## 2020-03-24 NOTE — Telephone Encounter (Signed)
Spoke with [facility] (where pt has his Oxygen through) & they do not have access anymore to [facility] records on equipment  [facility] needs an order for replacement CPAP or AutoPAP, need copy of sleep study (in Epic 07/23/2011) & recent OV note that speaks of use & benefit  Can we do an addendum to pt's last OV note 03/10/2020 that speaks of use & benefit? Will send all required documentation & get order to Dr. Nicki Reaper to be signed (in red folder in basket on wall in Dr. Bary Leriche office)   Spoke with pt - he would like an AutoPAP   Please advise

## 2020-03-25 NOTE — Telephone Encounter (Signed)
Faxed order to [facility], they'll contact pt to set up, called to notify pt

## 2020-03-28 ENCOUNTER — Other Ambulatory Visit: Payer: Self-pay

## 2020-03-28 ENCOUNTER — Ambulatory Visit (INDEPENDENT_AMBULATORY_CARE_PROVIDER_SITE_OTHER): Payer: Medicare HMO | Admitting: Orthotics

## 2020-03-28 ENCOUNTER — Encounter: Payer: Self-pay | Admitting: Cardiology

## 2020-03-28 ENCOUNTER — Ambulatory Visit: Payer: Medicare HMO | Admitting: Cardiology

## 2020-03-28 DIAGNOSIS — E785 Hyperlipidemia, unspecified: Secondary | ICD-10-CM | POA: Diagnosis not present

## 2020-03-28 DIAGNOSIS — I214 Non-ST elevation (NSTEMI) myocardial infarction: Secondary | ICD-10-CM

## 2020-03-28 DIAGNOSIS — I10 Essential (primary) hypertension: Secondary | ICD-10-CM

## 2020-03-28 DIAGNOSIS — E1142 Type 2 diabetes mellitus with diabetic polyneuropathy: Secondary | ICD-10-CM

## 2020-03-28 DIAGNOSIS — Z9861 Coronary angioplasty status: Secondary | ICD-10-CM

## 2020-03-28 DIAGNOSIS — L97521 Non-pressure chronic ulcer of other part of left foot limited to breakdown of skin: Secondary | ICD-10-CM

## 2020-03-28 DIAGNOSIS — M2142 Flat foot [pes planus] (acquired), left foot: Secondary | ICD-10-CM

## 2020-03-28 DIAGNOSIS — L84 Corns and callosities: Secondary | ICD-10-CM

## 2020-03-28 DIAGNOSIS — M2141 Flat foot [pes planus] (acquired), right foot: Secondary | ICD-10-CM | POA: Diagnosis not present

## 2020-03-28 DIAGNOSIS — I5031 Acute diastolic (congestive) heart failure: Secondary | ICD-10-CM

## 2020-03-28 DIAGNOSIS — M2041 Other hammer toe(s) (acquired), right foot: Secondary | ICD-10-CM | POA: Diagnosis not present

## 2020-03-28 DIAGNOSIS — N1832 Chronic kidney disease, stage 3b: Secondary | ICD-10-CM | POA: Diagnosis not present

## 2020-03-28 DIAGNOSIS — G4733 Obstructive sleep apnea (adult) (pediatric): Secondary | ICD-10-CM

## 2020-03-28 DIAGNOSIS — I2510 Atherosclerotic heart disease of native coronary artery without angina pectoris: Secondary | ICD-10-CM | POA: Diagnosis not present

## 2020-03-28 DIAGNOSIS — M2042 Other hammer toe(s) (acquired), left foot: Secondary | ICD-10-CM

## 2020-03-28 DIAGNOSIS — N183 Chronic kidney disease, stage 3 unspecified: Secondary | ICD-10-CM | POA: Insufficient documentation

## 2020-03-28 MED ORDER — METOPROLOL SUCCINATE ER 50 MG PO TB24: 50.0000 mg | ORAL_TABLET | Freq: Every day | ORAL | 3 refills | Status: AC

## 2020-03-28 MED ORDER — CLOPIDOGREL BISULFATE 75 MG PO TABS: 75.0000 mg | ORAL_TABLET | Freq: Every day | ORAL | 3 refills | Status: DC

## 2020-03-28 NOTE — Assessment & Plan Note (Signed)
Dr [name] has arranged for upgraded C-pap

## 2020-03-28 NOTE — Assessment & Plan Note (Signed)
PCI in 05/2011:  99% dRCA -DES x2 PCI 08/2012- Acute inferior MI secondary to total occlusion of the distal RCA, treated successfully with primary PCI  Out hospital MI July 2021- occluded LAD, RCA stent patent- medical Rx

## 2020-03-28 NOTE — Progress Notes (Signed)
Cardiology Office Note:    Date:  03/28/2020   ID:  Eugene [name], DOB 06-06-1956, MRN [id number]  PCP:  [name], MD  Cardiologist:  Skeet Latch, MD  Electrophysiologist:  None   Referring MD: [name], MD   No chief complaint on file.   History of Present Illness:    Eugene [name] is a 64 y.o. male with a hx of CAD s/p PCI 2012 and 9983, chronic diastolic heart failure, recurrent pericardial effusion s/p pericardial window in 2014 and Feb 2020 with residual moderate pericardial effusion without tamponade, HTN, diabetes, CKD III, and OSA. He was admitted 7/19/2021with an out of hospital NSTEMI, estimated to be 31 days old.  His Troponin peaked at 149.  Echo showed his EF to be lower -40-45% -than his previous echo from March 2021- 45-50%.  Cath 03/14/2020 showed an occluded mid LAD, patent RCA stent.  His WMA on echo was c/w his cath anatomy.  Plan is for medical Rx.   Plavix added for one year to aspirin, Lopressor changed to Toprol, diuretics increased, and Repatha started (pt was previously seen by Dr Debara Pickett and reportedly is pre approved).  He did diurese 6 lbs - DC weight 252 lbs.  O2 was added at discharge after his ambulating O2 sat dropped to 85% on room air.  On 2L his O2 sat came up to 95%.    He presents today for follow up.  He has done well since discharge.  His O2 sat has remained stable- > 92%.  He uses it mainly just at night now.  He denies chest pain.  He thought he wasn't supposed to take the aspirin since Plavix was added and I asked him to resume the aspirin.   Past Medical History:  Diagnosis Date  . Anemia   . Anxiety   . Anxiety   . Arteriosclerotic cardiovascular disease (ASCVD)    a. 05/2011 s/p DES to PDA and RCA. b. 08/2012 Inflat  STEMI/Cath/PCI: LM minor irregs, LAD 50p, D1 50, LCX nl, OM1 25, RCA 30-40p, 100d (treated with 2.75x70mm Promus Premier DES);    [name] Asthma   . Bell palsy   . C. difficile colitis    a. 08/2012  .  Cholelithiasis 07/2012   Asymptomatic; identified incidentally  . Chronic diastolic CHF (congestive heart failure) ([place])   . CKD (chronic kidney disease), stage III   . Contrast dye induced nephropathy    a. 08/2012 post cath/pci  . COPD (chronic obstructive pulmonary disease) ([place])   . Diabetes mellitus    Peripheral neuropathy  . Gallstones   . GERD (gastroesophageal reflux disease)   . Gout   . Hyperlipidemia   . Hypertension   . Myocardial infarct ([place]) 09/08/12  . Nephrolithiasis   . Old myocardial infarction   . Pericardial effusion    a. s/p window in 2014. b. s/p redo window in 09/2018.  [name] Peripheral neuropathy   . PONV (postoperative nausea and vomiting)   . Sleep apnea   . Statin intolerance     Past Surgical History:  Procedure Laterality Date  . CARDIAC CATHETERIZATION    . CHEST TUBE INSERTION    . CIRCUMCISION    . COLONOSCOPY     In [facility], approximately 2011 per patient, was normal. Advised to come back in 10 years.  . ESOPHAGOGASTRODUODENOSCOPY     [place] over 20 yrs ago  . ESOPHAGOGASTRODUODENOSCOPY  06/12/2012   JAS:[id number] esophagus-status post Venia Minks  dilation. Abnormal gastric mucosa of uncertain significance-status post biopsy  . EYE SURGERY    . INTRAOPERATIVE TRANSESOPHAGEAL ECHOCARDIOGRAM N/A 06/23/2013   Procedure: INTRAOPERATIVE TRANSESOPHAGEAL ECHOCARDIOGRAM;  Surgeon: Grace Isaac, MD;  Location: [facility];  Service: Open Heart Surgery;  Laterality: N/A;  . LEFT HEART CATH AND CORONARY ANGIOGRAPHY N/A 03/14/2020   Procedure: LEFT HEART CATH AND CORONARY ANGIOGRAPHY;  Surgeon: Lorretta Harp, MD;  Location: [facility] [place] CV LAB;  Service: Cardiovascular;  Laterality: N/A;  . LEFT HEART CATHETERIZATION WITH CORONARY ANGIOGRAM N/A 09/08/2012   Procedure: LEFT HEART CATHETERIZATION WITH CORONARY ANGIOGRAM;  Surgeon: [name], MD;  Location: [facility] CATH LAB;  Service: Cardiovascular;  Laterality: N/A;  . stents    . SUBXYPHOID  PERICARDIAL WINDOW N/A 06/23/2013   Procedure: SUBXYPHOID PERICARDIAL WINDOW;  Surgeon: Grace Isaac, MD;  Location: [facility];  Service: Thoracic;  Laterality: N/A;  . SUBXYPHOID PERICARDIAL WINDOW  10/01/2018  . SUBXYPHOID PERICARDIAL WINDOW N/A 10/01/2018   Procedure: REDO SUBXYPHOID PERICARDIAL WINDOW;  Surgeon: Grace Isaac, MD;  Location: [facility];  Service: Thoracic;  Laterality: N/A;  . TEE WITHOUT CARDIOVERSION N/A 10/01/2018   Procedure: TRANSESOPHAGEAL ECHOCARDIOGRAM (TEE);  Surgeon: Grace Isaac, MD;  Location: [facility];  Service: Thoracic;  Laterality: N/A;    Current Medications: Current Meds  Medication Sig  . acetaminophen (TYLENOL) 500 MG tablet Take 1,500 mg by mouth every 6 (six) hours as needed for mild pain, fever or headache.   . allopurinol (ZYLOPRIM) 100 MG tablet Take 100 mg by mouth as needed.  . ALPRAZolam (XANAX) 0.5 MG tablet TAKE 1-2 TABLETS (0.5-1 MG TOTAL) BY MOUTH AT BEDTIME AS NEEDED FOR SLEEP.  [name] aspirin 81 MG chewable tablet Chew 81 mg by mouth at bedtime.  . clopidogrel (PLAVIX) 75 MG tablet Take 1 tablet (75 mg total) by mouth daily.  . colchicine 0.6 MG tablet 1 twice daily as needed for gout flareup  . Evolocumab (REPATHA) 140 MG/ML SOSY Inject 140 mg into the skin every 14 (fourteen) days.  [name] glimepiride (AMARYL) 1 MG tablet TAKE 1 TABLET WITH MEALS AS DIRECTED. MAX 3 TABLETS PER DAY  . isosorbide mononitrate (IMDUR) 60 MG 24 hr tablet Take 1 tablet (60 mg total) by mouth daily.  [name] LEVEMIR FLEXTOUCH 100 UNIT/ML Pen INJECT 80 UNITS INTO THE SKIN DAILY AT 10 PM. (Patient taking differently: Inject 60 Units into the skin at bedtime. )  . losartan (COZAAR) 25 MG tablet TAKE 1 TABLET (25 MG TOTAL) BY MOUTH DAILY.  . metoprolol succinate (TOPROL-XL) 50 MG 24 hr tablet Take 1 tablet (50 mg total) by mouth daily. Take with or immediately following a meal.  . nitroGLYCERIN (NITROSTAT) 0.4 MG SL tablet Place 1 tablet (0.4 mg total) under the tongue every 5  (five) minutes as needed for chest pain.  [name] ofloxacin (FLOXIN) 0.3 % OTIC solution 5 drops as needed.  . torsemide (DEMADEX) 20 MG tablet Take 3 tablets (60 mg total) by mouth 2 (two) times daily.  . traMADol (ULTRAM) 50 MG tablet TAKE 1 TABLET (50 MG TOTAL) BY MOUTH EVERY 6 (SIX) HOURS AS NEEDED  . [DISCONTINUED] clopidogrel (PLAVIX) 75 MG tablet Take 1 tablet (75 mg total) by mouth daily.  . [DISCONTINUED] metoprolol succinate (TOPROL-XL) 50 MG 24 hr tablet Take 1 tablet (50 mg total) by mouth daily. Take with or immediately following a meal.     Allergies:   Hydrocodone, Lisinopril, Neurontin [gabapentin], Statins, Metformin and related, and Norvasc [amlodipine  besylate]   Social History   Socioeconomic History  . Marital status: Married    Spouse name: Not on file  . Number of children: 2  . Years of education: Not on file  . Highest education level: Not on file  Occupational History  . 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 Smoking status: Never Smoker  . Smokeless tobacco: Never Used  Vaping Use  . Vaping Use: Never used  Substance and Sexual Activity  . Alcohol use: No    Alcohol/week: 0.0 standard drinks    Comment: heavy etoh use 30 years ago  . Drug use: No  . Sexual activity: Yes    Birth control/protection: None  Other Topics Concern  . 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 Disabled at this point.   Social Determinants of Health   Financial Resource Strain:   . Difficulty of Paying Living Expenses:   Food Insecurity:   . Worried About Charity fundraiser in the Last Year:   . Arboriculturist in the Last Year:   Transportation Needs:   . Film/video editor (Medical):   [name] Lack of Transportation (Non-Medical):   Physical Activity:   . Days of Exercise per Week:   . Minutes of Exercise per Session:   Stress:   . Feeling of Stress :   Social  Connections:   . Frequency of Communication with Friends and Family:   . Frequency of Social Gatherings with Friends and Family:   . Attends Religious Services:   . Active Member of Clubs or Organizations:   . Attends Archivist Meetings:   [name] Marital Status:      Family History: The patient's family history includes Diabetes in his brother, brother, mother, and sister; Heart attack in his mother; Hypertension in his brother and brother; Sleep apnea in his sister; Stroke in his mother. There is no history of Colon cancer or Liver disease.  ROS:   Please see the history of present illness.     All other systems reviewed and are negative.  EKGs/Labs/Other Studies Reviewed:    The following studies were reviewed today: Cath 03/14/2020 Echo 03/14/2020  EKG:  EKG is ordered today.  The ekg ordered today demonstrates NSR- HR 86, inferior and AS Qs  Recent Labs: 03/04/2020: ALT 10 03/13/2020: B Natriuretic Peptide 322.4 03/17/2020: Hemoglobin 9.3; Platelets 163 03/18/2020: BUN 66; Creatinine, Ser 1.92; Potassium 4.9; Sodium 135  Recent Lipid Panel    Component Value Date/Time   CHOL 219 (H) 03/04/2020 1427   TRIG 254 (H) 03/04/2020 1427   HDL 29 (L) 03/04/2020 1427   CHOLHDL 7.6 (H) 03/04/2020 1427   CHOLHDL 9.8 11/10/2015 0521   VLDL UNABLE TO CALCULATE IF TRIGLYCERIDE OVER 400 mg/dL 11/10/2015 0521   LDLCALC 143 (H) 03/04/2020 1427    Physical Exam:    VS:  BP (!) 148/76   Pulse 86   Ht 6\' 1"  (1.854 m)   Wt 253 lb 6.4 oz (114.9 kg)   SpO2 97%   BMI 33.43 kg/m     Wt Readings from Last 3 Encounters:  03/28/20 253 lb 6.4 oz (114.9 kg)  03/18/20 (!) 252 lb 6.8 oz (114.5 kg)  03/10/20 255 lb (115.7 kg)     GEN: Overweight caucasian male, well developed in no acute distress HEENT: Normal NECK: No JVD;  No carotid bruits CARDIAC: RRR, no murmurs, rubs, gallops RESPIRATORY:  Clear to auscultation without rales, wheezing or rhonchi  ABDOMEN: Soft, non-tender,  non-distended MUSCULOSKELETAL:  No edema-support stockings in place; No deformity  SKIN: Warm and dry NEUROLOGIC:  Alert and oriented x 3 PSYCHIATRIC:  Normal affect   ASSESSMENT:    NSTEMI (non-ST elevated myocardial infarction) ([place]) Out of hospital MI 03/08/2020  CAD S/P percutaneous coronary angioplasty PCI in 05/2011:  99% dRCA -DES x2 PCI 08/2012- Acute inferior MI secondary to total occlusion of the distal RCA, treated successfully with primary PCI  Out hospital MI July 2021- occluded LAD, RCA stent patent- medical Rx  CRI- 3b  Acute diastolic CHF (congestive heart failure) ([address]) Stable today in the office  Obstructive sleep apnea- on C-pap Dr [name] has arranged for upgraded C-pap  Essential hypertension Controlled- he brought in readings from home  HLD (hyperlipidemia) Dr Debara Pickett follows- he is on Repatha  PLAN:    Resume aspirin.  Keep f/u with Dr Debara Pickett in Oct- Dr Oval Linsey in Jan 2022. Check BMP today.   Medication Adjustments/Labs and Tests Ordered: Current medicines are reviewed at length with the patient today.  Concerns regarding medicines are outlined above.  Orders Placed This Encounter  Procedures  . Basic metabolic panel  . EKG 12-Lead   Meds ordered this encounter  Medications  . clopidogrel (PLAVIX) 75 MG tablet    Sig: Take 1 tablet (75 mg total) by mouth daily.    Dispense:  90 tablet    Refill:  3  . metoprolol succinate (TOPROL-XL) 50 MG 24 hr tablet    Sig: Take 1 tablet (50 mg total) by mouth daily. Take with or immediately following a meal.    Dispense:  90 tablet    Refill:  3    Patient Instructions  Medication Instructions:   RESUME ASPIRIN  *If you need a refill on your cardiac medications before your next appointment, please call your pharmacy*   Labs: Your physician recommends that you return for lab work today: BMET   Follow-Up: At [facility], you and your health needs are our priority.  As part of our continuing  mission to provide you with exceptional heart care, we have created designated Provider Care Teams.  These Care Teams include your primary Cardiologist (physician) and Advanced Practice Providers (APPs -  Physician Assistants and Nurse Practitioners) who all work together to provide you with the care you need, when you need it.  We recommend signing up for the patient portal called "MyChart".  Sign up information is provided on this After Visit Summary.  MyChart is used to connect with patients for Virtual Visits (Telemedicine).  Patients are able to view lab/test results, encounter notes, upcoming appointments, etc.  Non-urgent messages can be sent to your provider as well.   To learn more about what you can do with MyChart, go to [email or web address].    Your next appointment:   January  The format for your next appointment:   In Person  Provider:   Skeet Latch, MD   Other Instructions Please call our office in November to schedule an appointment with Dr. Oval Linsey in January.  Keep your follow-up appointment with Dr. Debara Pickett in October.     Angelena Form, PA-C  03/28/2020 12:24 PM    [facility]

## 2020-03-28 NOTE — Assessment & Plan Note (Signed)
Out of hospital MI 03/08/2020

## 2020-03-28 NOTE — Assessment & Plan Note (Signed)
Dr Debara Pickett follows- he is on Repatha

## 2020-03-28 NOTE — Patient Instructions (Signed)
Medication Instructions:   RESUME ASPIRIN  *If you need a refill on your cardiac medications before your next appointment, please call your pharmacy*   Labs: Your physician recommends that you return for lab work today: BMET   Follow-Up: At [facility], you and your health needs are our priority.  As part of our continuing mission to provide you with exceptional heart care, we have created designated Provider Care Teams.  These Care Teams include your primary Cardiologist (physician) and Advanced Practice Providers (APPs -  Physician Assistants and Nurse Practitioners) who all work together to provide you with the care you need, when you need it.  We recommend signing up for the patient portal called "MyChart".  Sign up information is provided on this After Visit Summary.  MyChart is used to connect with patients for Virtual Visits (Telemedicine).  Patients are able to view lab/test results, encounter notes, upcoming appointments, etc.  Non-urgent messages can be sent to your provider as well.   To learn more about what you can do with MyChart, go to [email or web address].    Your next appointment:   January  The format for your next appointment:   In Person  Provider:   Skeet Latch, MD   Other Instructions Please call our office in November to schedule an appointment with Dr. Oval Linsey in January.  Keep your follow-up appointment with Dr. Debara Pickett in October.

## 2020-03-28 NOTE — Assessment & Plan Note (Signed)
Stable today in the office

## 2020-03-28 NOTE — Assessment & Plan Note (Signed)
Controlled- he brought in readings from home

## 2020-03-28 NOTE — Progress Notes (Signed)

## 2020-03-29 LAB — BASIC METABOLIC PANEL
BUN/Creatinine Ratio: 26 — ABNORMAL HIGH (ref 10–24)
BUN: 52 mg/dL — ABNORMAL HIGH (ref 8–27)
CO2: 26 mmol/L (ref 20–29)
Calcium: 9.4 mg/dL (ref 8.6–10.2)
Chloride: 97 mmol/L (ref 96–106)
Creatinine, Ser: 2 mg/dL — ABNORMAL HIGH (ref 0.76–1.27)
GFR calc Af Amer: 40 mL/min/{1.73_m2} — ABNORMAL LOW (ref >59)
GFR calc non Af Amer: 34 mL/min/{1.73_m2} — ABNORMAL LOW (ref >59)
Glucose: 230 mg/dL — ABNORMAL HIGH (ref 65–99)
Potassium: 5.3 mmol/L — ABNORMAL HIGH (ref 3.5–5.2)
Sodium: 137 mmol/L (ref 134–144)

## 2020-03-30 ENCOUNTER — Other Ambulatory Visit: Payer: Self-pay | Admitting: Cardiology

## 2020-03-30 ENCOUNTER — Telehealth: Payer: Self-pay | Admitting: Internal Medicine

## 2020-03-30 NOTE — Telephone Encounter (Signed)
-----   Message from Therisa Doyne sent at 03/28/2020 12:20 PM EDT ----- Regarding: Repatha Patient Assistance [place]! Lurena Joiner saw this pt in the office today. He asked about getting Patient Assistance for [place] and was wondering if you can start the process for that. He said even with it being approved through insurance, the copay cost is still too expensive. He has follow-up appt scheduled in October with Dr. Debara Pickett. I told him you would be in contact with him about it and that he could go over it with you and Dr. Debara Pickett at his follow-up appt. He said the next refill for it is coming up this month.  Thanks, Lovena Le, CMA

## 2020-03-30 NOTE — Telephone Encounter (Signed)
Spoke with patient and he provided email for his step-brother to send info for healthwell grant [email or web address]  Emailed from fax machine

## 2020-03-30 NOTE — Telephone Encounter (Signed)
Spoke with patient who has pen-injector for dose on August 7. He will need assistance with medication. Asked about [email or web address] grant but he has yet to apply and will need help. Offered to help him apply for grant but he said he will call back with an email address where I can send the info on the grant.

## 2020-03-30 NOTE — Telephone Encounter (Signed)
Patient returned Jenna's call. Transferred call to [place].

## 2020-03-30 NOTE — Telephone Encounter (Signed)
Rx(s) sent to pharmacy electronically.  

## 2020-04-01 ENCOUNTER — Telehealth: Payer: Self-pay | Admitting: *Deleted

## 2020-04-01 DIAGNOSIS — Z79899 Other long term (current) drug therapy: Secondary | ICD-10-CM

## 2020-04-01 IMAGING — CR DG CHEST 2V
2 series · 2 of 2 positions shown · non-contrast
Comparison: CT 09/11/2018

CLINICAL DATA: Pre op for pericarditis drain, some Sob , non
smoker, hx htn and diabetes, copd from working in [employer] [place],

EXAM:
CHEST - 2 VIEW

[w chest pa]
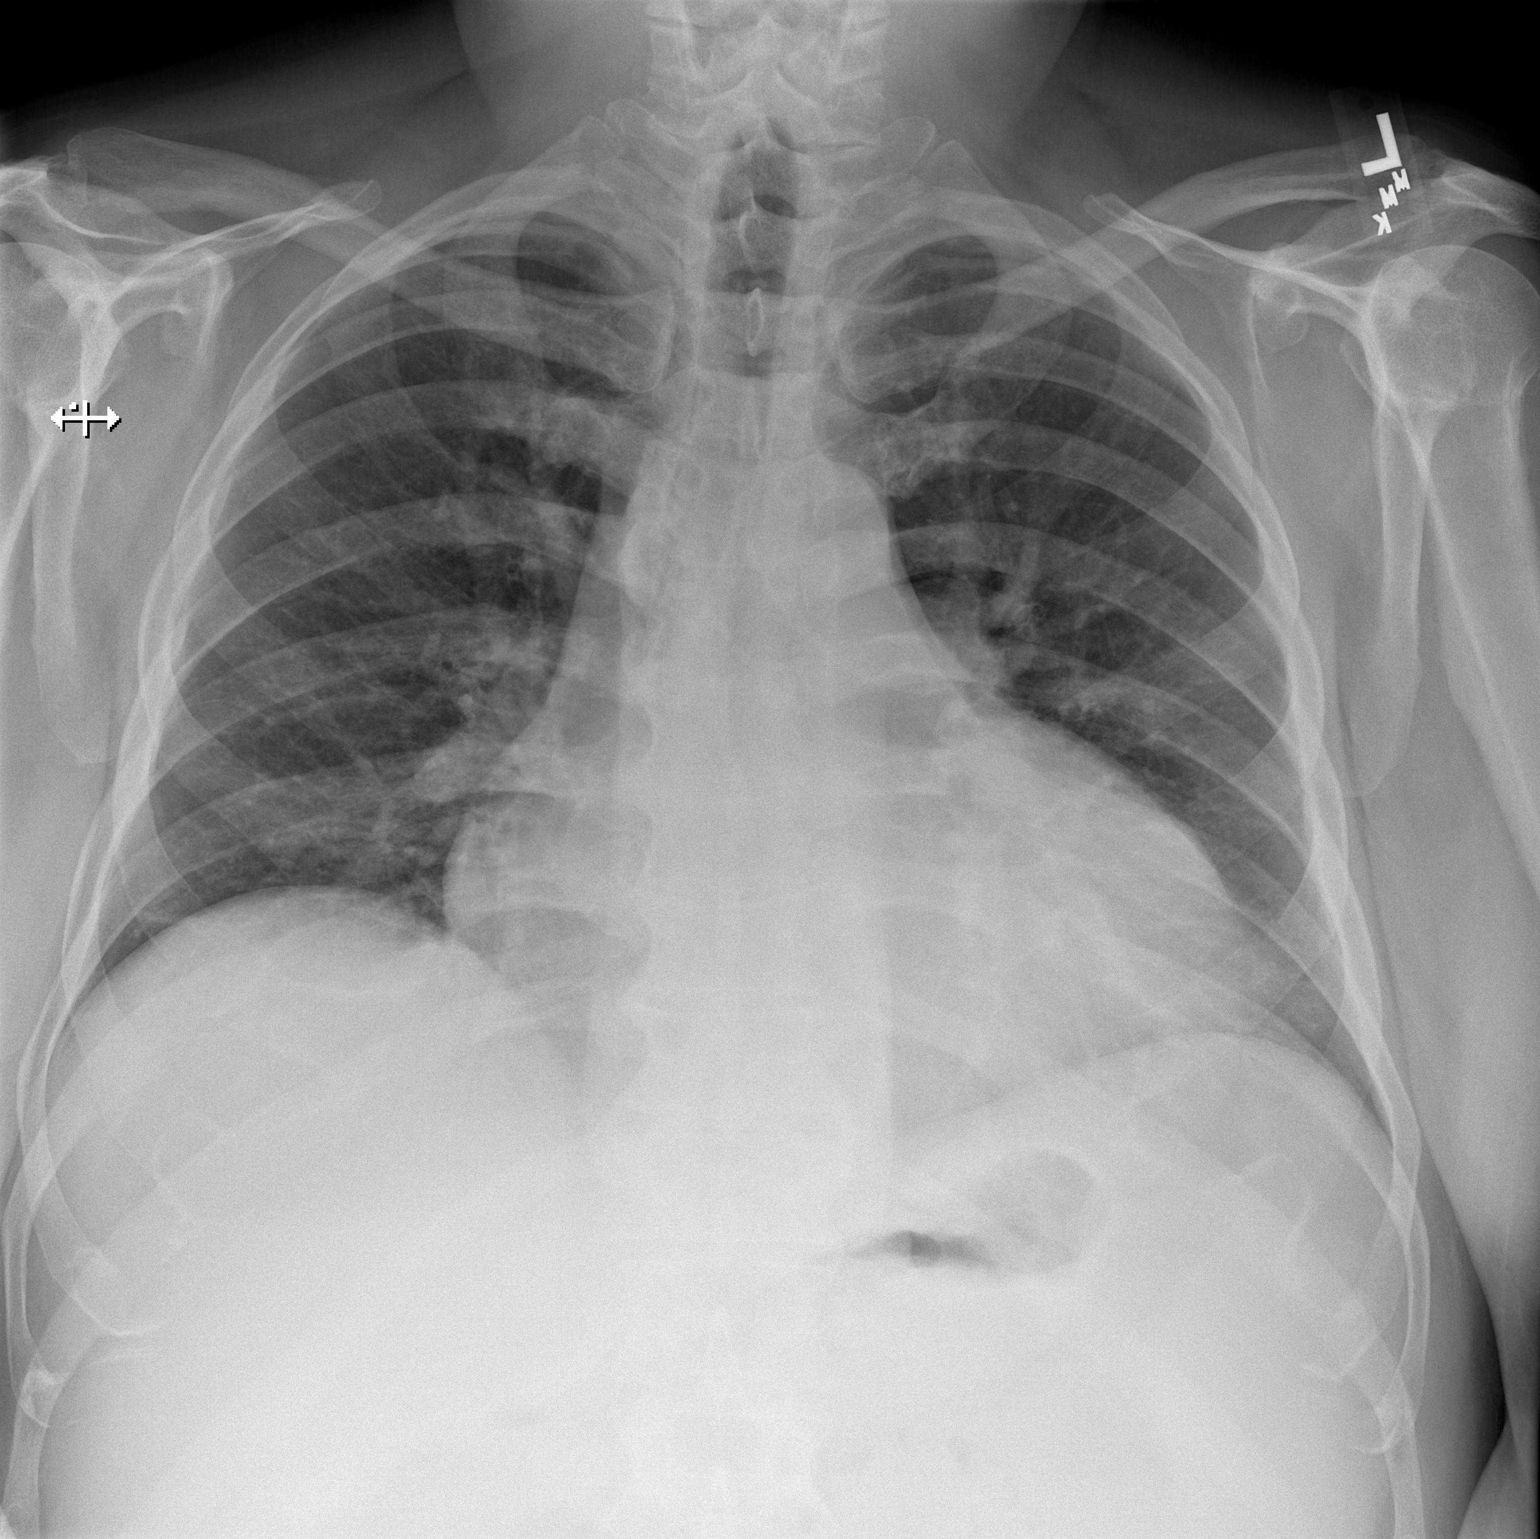

[w chest lat]
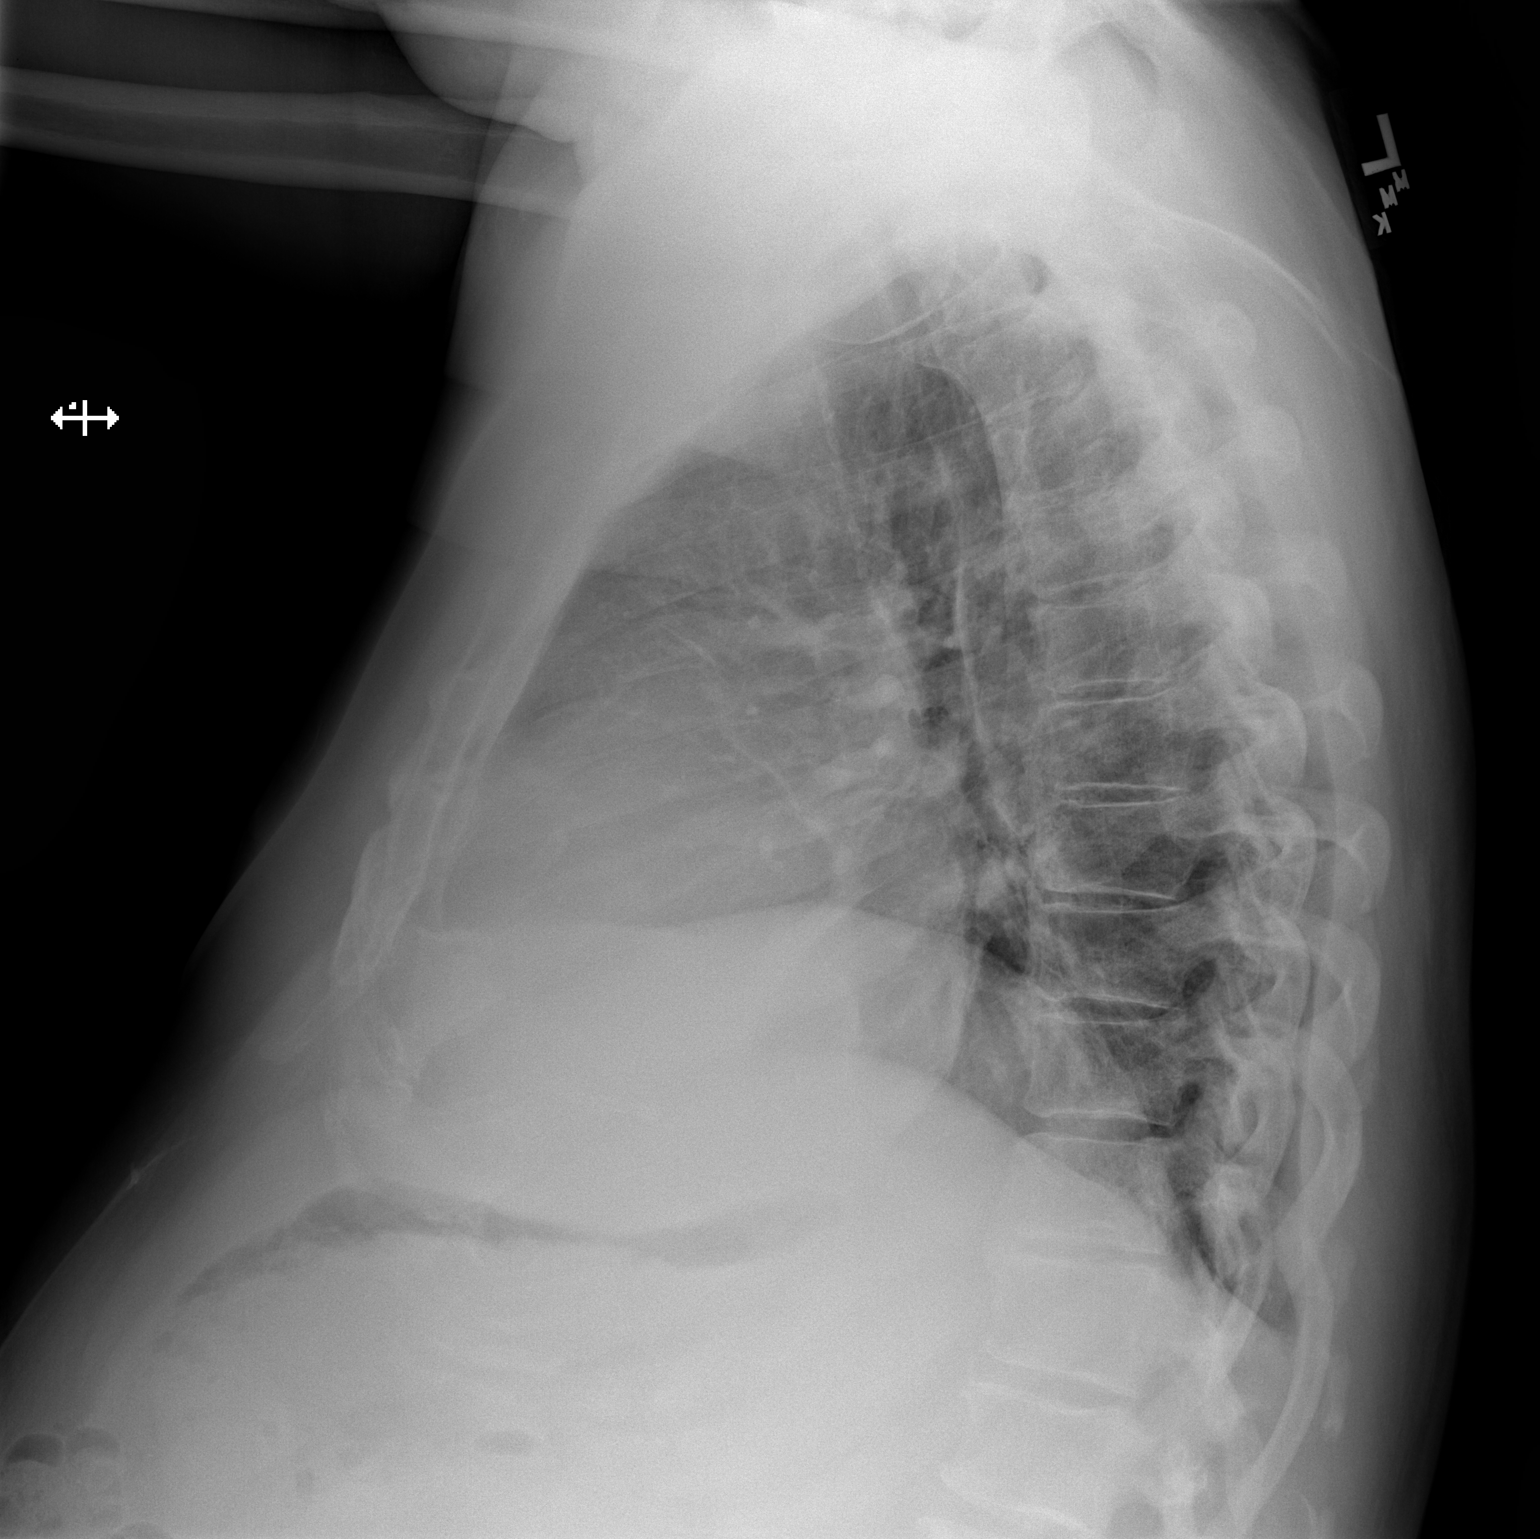

[2 of 2 positions shown; findings below may reference images not displayed]

FINDINGS: Enlarged cardiac silhouette similar to comparison exam. No pulmonary
effusion, infiltrate, or pneumothorax.
IMPRESSION: 1. Large cardiac silhouette corresponding to pericardial effusion on
comparison CT.
2. No pulmonary edema or infiltrate.

## 2020-04-01 NOTE — Telephone Encounter (Signed)
-----   Message from Erlene Quan, [place] sent at 03/31/2020  7:54 AM EDT ----- Please let the patient know I reviewed his labs- I suggest he decrease his Torsemide to 40 mg BID.  He should avoid high K+ foods. Repeat BMP in 2 weeks.  Kerin Ransom PA-C 03/31/2020 7:53 AM

## 2020-04-01 NOTE — Telephone Encounter (Signed)
Pt aware of his blood work, pt made aware to decrease torsemide to 40 mg by mouth twice a day, BMP ordered and mailed to pt.

## 2020-04-05 ENCOUNTER — Encounter: Payer: Self-pay | Admitting: Podiatry

## 2020-04-05 ENCOUNTER — Other Ambulatory Visit: Payer: Self-pay

## 2020-04-05 ENCOUNTER — Ambulatory Visit: Payer: Medicare HMO | Admitting: Podiatry

## 2020-04-05 DIAGNOSIS — B351 Tinea unguium: Secondary | ICD-10-CM

## 2020-04-05 DIAGNOSIS — M79674 Pain in right toe(s): Secondary | ICD-10-CM | POA: Diagnosis not present

## 2020-04-05 DIAGNOSIS — M79675 Pain in left toe(s): Secondary | ICD-10-CM

## 2020-04-05 DIAGNOSIS — E1142 Type 2 diabetes mellitus with diabetic polyneuropathy: Secondary | ICD-10-CM

## 2020-04-06 NOTE — Progress Notes (Signed)
Subjective: Eugene [name] presents today at risk foot care. Pt has h/o NIDDM with chronic kidney disease and painful mycotic nails b/l that are difficult to trim. Pain interferes with ambulation. Aggravating factors include wearing enclosed shoe gear. Pain is relieved with periodic professional debridement.   He relates he has had a light heart attack since he's seen me last.    [name], MD is patient's PCP. Last visit was: 03/10/2020.  Past Medical History:  Diagnosis Date  . Anemia   . Anxiety   . Anxiety   . Arteriosclerotic cardiovascular disease (ASCVD)    a. 05/2011 s/p DES to PDA and RCA. b. 08/2012 Inflat  STEMI/Cath/PCI: LM minor irregs, LAD 50p, D1 50, LCX nl, OM1 25, RCA 30-40p, 100d (treated with 2.75x36mm Promus Premier DES);    [name] Asthma   . Bell palsy   . C. difficile colitis    a. 08/2012  . Cholelithiasis 07/2012   Asymptomatic; identified incidentally  . Chronic diastolic CHF (congestive heart failure) ([place])   . CKD (chronic kidney disease), stage III   . Contrast dye induced nephropathy    a. 08/2012 post cath/pci  . COPD (chronic obstructive pulmonary disease) ([place])   . Diabetes mellitus    Peripheral neuropathy  . Gallstones   . GERD (gastroesophageal reflux disease)   . Gout   . Hyperlipidemia   . Hypertension   . Myocardial infarct ([place]) 09/08/12  . Nephrolithiasis   . Old myocardial infarction   . Pericardial effusion    a. s/p window in 2014. b. s/p redo window in 09/2018.  [name] Peripheral neuropathy   . PONV (postoperative nausea and vomiting)   . Sleep apnea   . Statin intolerance      Current Outpatient Medications on File Prior to Visit  Medication Sig Dispense Refill  . acetaminophen (TYLENOL) 500 MG tablet Take 1,500 mg by mouth every 6 (six) hours as needed for mild pain, fever or headache.     . allopurinol (ZYLOPRIM) 100 MG tablet Take 100 mg by mouth as needed.    . ALPRAZolam (XANAX) 0.5 MG tablet TAKE 1-2 TABLETS (0.5-1 MG  TOTAL) BY MOUTH AT BEDTIME AS NEEDED FOR SLEEP. 30 tablet 3  . aspirin 81 MG chewable tablet Chew 81 mg by mouth at bedtime.    . clopidogrel (PLAVIX) 75 MG tablet Take 1 tablet (75 mg total) by mouth daily. 90 tablet 3  . colchicine 0.6 MG tablet 1 twice daily as needed for gout flareup 20 tablet 2  . Evolocumab (REPATHA SURECLICK) 449 MG/ML SOAJ Inject 1 Dose into the skin every 14 (fourteen) days. 2 pen 11  . Evolocumab (REPATHA) 140 MG/ML SOSY Inject 140 mg into the skin every 14 (fourteen) days. 2.1 mL 6  . glimepiride (AMARYL) 1 MG tablet TAKE 1 TABLET WITH MEALS AS DIRECTED. MAX 3 TABLETS PER DAY 270 tablet 1  . isosorbide mononitrate (IMDUR) 60 MG 24 hr tablet Take 1 tablet (60 mg total) by mouth daily. 90 tablet 2  . LEVEMIR FLEXTOUCH 100 UNIT/ML Pen INJECT 80 UNITS INTO THE SKIN DAILY AT 10 PM. (Patient taking differently: Inject 60 Units into the skin at bedtime. ) 30 mL 5  . losartan (COZAAR) 25 MG tablet TAKE 1 TABLET (25 MG TOTAL) BY MOUTH DAILY. 90 tablet 2  . metoprolol succinate (TOPROL-XL) 50 MG 24 hr tablet Take 1 tablet (50 mg total) by mouth daily. Take with or immediately following a meal. 90 tablet 3  .  nitroGLYCERIN (NITROSTAT) 0.4 MG SL tablet Place 1 tablet (0.4 mg total) under the tongue every 5 (five) minutes as needed for chest pain. 25 tablet 2  . ofloxacin (FLOXIN) 0.3 % OTIC solution 5 drops as needed.    . torsemide (DEMADEX) 20 MG tablet Take 40 mg by mouth daily.    . traMADol (ULTRAM) 50 MG tablet TAKE 1 TABLET (50 MG TOTAL) BY MOUTH EVERY 6 (SIX) HOURS AS NEEDED 24 tablet 2   No current facility-administered medications on file prior to visit.     Allergies  Allergen Reactions  . Hydrocodone Nausea And Vomiting  . Lisinopril Cough  . Neurontin [Gabapentin] Other (See Comments)    Reaction:  Suicidal thoughts   . Statins Other (See Comments)    Reaction:  Muscle pain   . Metformin And Related Diarrhea  . Norvasc [Amlodipine Besylate] Swelling and Other  (See Comments)    Reaction:  Pedal edema    Objective: Eugene [name] is a pleasant 64 y.o. y.o. Patient Race: White or Caucasian [1]  male in NAD. AAO x 3.  There were no vitals filed for this visit.  Vascular Examination: Neurovascular status unchanged b/l lower extremities. Capillary refill time to digits immediate b/l. Palpable DP pulses b/l. Palpable PT pulses b/l. Pedal hair sparse b/l. Skin temperature gradient within normal limits b/l.  Dermatological Examination: Pedal skin with normal turgor, texture and tone bilaterally. No open wounds bilaterally. No interdigital macerations bilaterally. Toenails 1-5 left, 2-5 right elongated, discolored, dystrophic, thickened, crumbly with subungual debris and tenderness to dorsal palpation.   Anonychia right great toe(s). Nailbed(s) completely epithelialized and intact.  Musculoskeletal: Normal muscle strength 5/5 to all lower extremity muscle groups bilaterally. No pain crepitus or joint limitation noted with ROM b/l. Hammertoes noted to the 2-5 bilaterally. Pes planus deformity noted b/l.   Neurological Examination: Protective sensation diminished with 10g monofilament b/l. Vibratory sensation diminished b/l. Proprioception intact bilaterally.  Assessment: 1. Pain due to onychomycosis of toenails of both feet   2. Diabetic polyneuropathy associated with type 2 diabetes mellitus ([place])     Plan: -Examined patient. -Continue diabetic foot care principles. -Toenails 1-5 left, 2-5 rightl were debrided in length and girth with sterile nail nippers and dremel without iatrogenic bleeding.  -Patient to continue soft, supportive shoe gear daily. -Patient to report any pedal injuries to medical professional immediately. -Patient/POA to call should there be question/concern in the interim.  Return in about 10 weeks (around 06/14/2020) for diabetic nail trim, at risk foot care.  Marzetta Board, DPM

## 2020-04-08 ENCOUNTER — Telehealth: Payer: Self-pay | Admitting: Internal Medicine

## 2020-04-08 NOTE — Telephone Encounter (Signed)
New message:     Eugene [name] from [facility] for oxygen. Please call back concerning this patient.

## 2020-04-08 NOTE — Telephone Encounter (Signed)
Spoke with wanda, she is inquiring about a CMN form for oxygen that was faxed to [facility]. Asked her to refax to dr Oval Linsey.

## 2020-04-14 NOTE — Telephone Encounter (Signed)
Faxed received, Dr Ellyn Hack patient and have placed orders in his box

## 2020-04-15 DIAGNOSIS — N1831 Chronic kidney disease, stage 3a: Secondary | ICD-10-CM | POA: Diagnosis not present

## 2020-04-15 DIAGNOSIS — E1121 Type 2 diabetes mellitus with diabetic nephropathy: Secondary | ICD-10-CM | POA: Diagnosis not present

## 2020-04-15 DIAGNOSIS — I10 Essential (primary) hypertension: Secondary | ICD-10-CM | POA: Diagnosis not present

## 2020-04-15 DIAGNOSIS — Z794 Long term (current) use of insulin: Secondary | ICD-10-CM | POA: Diagnosis not present

## 2020-04-15 DIAGNOSIS — E785 Hyperlipidemia, unspecified: Secondary | ICD-10-CM | POA: Diagnosis not present

## 2020-04-16 LAB — BASIC METABOLIC PANEL
BUN/Creatinine Ratio: 25 — ABNORMAL HIGH (ref 10–24)
BUN: 42 mg/dL — ABNORMAL HIGH (ref 8–27)
CO2: 24 mmol/L (ref 20–29)
Calcium: 9 mg/dL (ref 8.6–10.2)
Chloride: 102 mmol/L (ref 96–106)
Creatinine, Ser: 1.68 mg/dL — ABNORMAL HIGH (ref 0.76–1.27)
GFR calc Af Amer: 49 mL/min/{1.73_m2} — ABNORMAL LOW (ref >59)
GFR calc non Af Amer: 43 mL/min/{1.73_m2} — ABNORMAL LOW (ref >59)
Glucose: 76 mg/dL (ref 65–99)
Potassium: 4.6 mmol/L (ref 3.5–5.2)
Sodium: 140 mmol/L (ref 134–144)

## 2020-04-16 LAB — LIPID PANEL
Chol/HDL Ratio: 3.8 ratio (ref 0.0–5.0)
Cholesterol, Total: 124 mg/dL (ref 100–199)
HDL: 33 mg/dL — ABNORMAL LOW (ref >39)
LDL Chol Calc (NIH): 47 mg/dL (ref 0–99)
Triglycerides: 289 mg/dL — ABNORMAL HIGH (ref 0–149)
VLDL Cholesterol Cal: 44 mg/dL — ABNORMAL HIGH (ref 5–40)

## 2020-04-16 LAB — URIC ACID: Uric Acid: 13.1 mg/dL — ABNORMAL HIGH (ref 3.8–8.4)

## 2020-04-18 ENCOUNTER — Encounter: Payer: Self-pay | Admitting: Internal Medicine

## 2020-04-18 NOTE — Telephone Encounter (Signed)
Received a call from Gershon Cull from sleep studies in regards to this patient's oxygen. She spoke with Mariann Laster from [facility] and is calling to follow up on the patients form that was refaxed to Dr. Oval Linsey. Please advise.    Mariann Laster from [facility]: [phone] ext [phone]

## 2020-04-19 NOTE — Telephone Encounter (Signed)
Faxed signed order today

## 2020-04-19 NOTE — Telephone Encounter (Signed)
Paperwork is present in the office . Awaiting for signature from Dr Oval Linsey

## 2020-04-20 DIAGNOSIS — I5033 Acute on chronic diastolic (congestive) heart failure: Secondary | ICD-10-CM | POA: Diagnosis not present

## 2020-04-20 DIAGNOSIS — I509 Heart failure, unspecified: Secondary | ICD-10-CM | POA: Diagnosis not present

## 2020-04-20 DIAGNOSIS — G4733 Obstructive sleep apnea (adult) (pediatric): Secondary | ICD-10-CM | POA: Diagnosis not present

## 2020-04-20 DIAGNOSIS — I5023 Acute on chronic systolic (congestive) heart failure: Secondary | ICD-10-CM | POA: Diagnosis not present

## 2020-04-23 DIAGNOSIS — I5033 Acute on chronic diastolic (congestive) heart failure: Secondary | ICD-10-CM | POA: Diagnosis not present

## 2020-04-23 DIAGNOSIS — I509 Heart failure, unspecified: Secondary | ICD-10-CM | POA: Diagnosis not present

## 2020-04-23 DIAGNOSIS — I5023 Acute on chronic systolic (congestive) heart failure: Secondary | ICD-10-CM | POA: Diagnosis not present

## 2020-04-27 DIAGNOSIS — S134XXA Sprain of ligaments of cervical spine, initial encounter: Secondary | ICD-10-CM | POA: Diagnosis not present

## 2020-04-27 DIAGNOSIS — M9902 Segmental and somatic dysfunction of thoracic region: Secondary | ICD-10-CM | POA: Diagnosis not present

## 2020-04-27 DIAGNOSIS — M9901 Segmental and somatic dysfunction of cervical region: Secondary | ICD-10-CM | POA: Diagnosis not present

## 2020-04-27 DIAGNOSIS — S233XXA Sprain of ligaments of thoracic spine, initial encounter: Secondary | ICD-10-CM | POA: Diagnosis not present

## 2020-04-27 DIAGNOSIS — M9903 Segmental and somatic dysfunction of lumbar region: Secondary | ICD-10-CM | POA: Diagnosis not present

## 2020-04-27 DIAGNOSIS — S335XXA Sprain of ligaments of lumbar spine, initial encounter: Secondary | ICD-10-CM | POA: Diagnosis not present

## 2020-04-29 ENCOUNTER — Telehealth: Payer: Self-pay | Admitting: Family Medicine

## 2020-04-29 NOTE — Telephone Encounter (Signed)
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 Pt has been approved for grant from 03/27/20-03/26/2021 to help with insulin. Form in provider office for review. Please advise. Thank you

## 2020-05-02 NOTE — Telephone Encounter (Signed)
Nurses I have signed the form.  Please fill-in which product he uses and how much he uses then send the form onward thank you

## 2020-05-03 ENCOUNTER — Other Ambulatory Visit: Payer: Self-pay | Admitting: Family Medicine

## 2020-05-03 DIAGNOSIS — F419 Anxiety disorder, unspecified: Secondary | ICD-10-CM | POA: Insufficient documentation

## 2020-05-04 ENCOUNTER — Other Ambulatory Visit: Payer: Self-pay | Admitting: Family Medicine

## 2020-05-04 NOTE — Telephone Encounter (Signed)
Form faxed

## 2020-05-05 ENCOUNTER — Other Ambulatory Visit: Payer: Self-pay | Admitting: *Deleted

## 2020-05-05 MED ORDER — ISOSORBIDE MONONITRATE ER 60 MG PO TB24: 60.0000 mg | ORAL_TABLET | Freq: Every day | ORAL | 2 refills | Status: DC

## 2020-05-06 ENCOUNTER — Telehealth: Payer: Self-pay | Admitting: Family Medicine

## 2020-05-06 ENCOUNTER — Other Ambulatory Visit: Payer: Self-pay

## 2020-05-06 NOTE — Telephone Encounter (Signed)
[facility] to be contacted to have them send a downloaded copy of CPAP machine readings. Paper is at nurses station. Pt has appt 07/11/20 [phone]-[facility]

## 2020-05-09 ENCOUNTER — Telehealth: Payer: Self-pay | Admitting: Internal Medicine

## 2020-05-09 NOTE — Telephone Encounter (Signed)
Patient approved for healthwell [employer] grant for hypercholesterolemia from 03/23/2020 - 03/22/2021  Pharmacy card ID: [id number] PC Group: [id number] PC BIN: [id number] PC PCN: [id number] PC Processor: PDMI  healthwell ID number: [id number]

## 2020-05-10 NOTE — Telephone Encounter (Signed)
Called [employer] and held for 20 minutes but was told they would fax over copy of cpap download. Await fax.

## 2020-05-11 NOTE — Telephone Encounter (Signed)
Download sent in and will hold at nurse station til pt's appt 11/15

## 2020-05-17 ENCOUNTER — Telehealth: Payer: Self-pay | Admitting: Family Medicine

## 2020-05-17 NOTE — Telephone Encounter (Signed)
Pt states medication was sent to wrong pharmacy  isosorbide mononitrate (IMDUR) 60 MG 24 hr tablet   [place], [place] - [address]

## 2020-05-17 NOTE — Telephone Encounter (Signed)
Lmtc. We are not prescribing this medication and did not send it in.

## 2020-05-18 ENCOUNTER — Telehealth: Payer: Self-pay | Admitting: Internal Medicine

## 2020-05-18 MED ORDER — ISOSORBIDE MONONITRATE ER 60 MG PO TB24: 60.0000 mg | ORAL_TABLET | Freq: Every day | ORAL | 3 refills | Status: AC

## 2020-05-18 NOTE — Telephone Encounter (Signed)
Prior authorization request generated via [email or web address] for [employer].  Per chart review, medication approved with [employer] until Jan 2022 Spoke with patient who reports he has NOT changed insurance plans however cannot use previously approved healthwell grant funds for medication any more Explained that recently the funds closed but I was unclear if this would impact those already approved Advised will mail assistance application to patient to be completed to apply for [employer]  Also refilled isosorbide to correct pharmacy

## 2020-05-19 ENCOUNTER — Other Ambulatory Visit: Payer: Medicare HMO

## 2020-05-21 DIAGNOSIS — I5023 Acute on chronic systolic (congestive) heart failure: Secondary | ICD-10-CM | POA: Diagnosis not present

## 2020-05-21 DIAGNOSIS — I5033 Acute on chronic diastolic (congestive) heart failure: Secondary | ICD-10-CM | POA: Diagnosis not present

## 2020-05-21 DIAGNOSIS — I509 Heart failure, unspecified: Secondary | ICD-10-CM | POA: Diagnosis not present

## 2020-05-21 DIAGNOSIS — G4733 Obstructive sleep apnea (adult) (pediatric): Secondary | ICD-10-CM | POA: Diagnosis not present

## 2020-05-23 ENCOUNTER — Other Ambulatory Visit: Payer: Self-pay

## 2020-05-23 ENCOUNTER — Encounter (INDEPENDENT_AMBULATORY_CARE_PROVIDER_SITE_OTHER): Payer: Medicare HMO | Admitting: Ophthalmology

## 2020-05-23 DIAGNOSIS — I10 Essential (primary) hypertension: Secondary | ICD-10-CM

## 2020-05-23 DIAGNOSIS — H35033 Hypertensive retinopathy, bilateral: Secondary | ICD-10-CM

## 2020-05-23 DIAGNOSIS — E113311 Type 2 diabetes mellitus with moderate nonproliferative diabetic retinopathy with macular edema, right eye: Secondary | ICD-10-CM

## 2020-05-23 DIAGNOSIS — D3132 Benign neoplasm of left choroid: Secondary | ICD-10-CM | POA: Diagnosis not present

## 2020-05-23 DIAGNOSIS — E11311 Type 2 diabetes mellitus with unspecified diabetic retinopathy with macular edema: Secondary | ICD-10-CM | POA: Diagnosis not present

## 2020-05-23 DIAGNOSIS — E113512 Type 2 diabetes mellitus with proliferative diabetic retinopathy with macular edema, left eye: Secondary | ICD-10-CM

## 2020-05-23 DIAGNOSIS — H43813 Vitreous degeneration, bilateral: Secondary | ICD-10-CM

## 2020-05-23 DIAGNOSIS — S335XXA Sprain of ligaments of lumbar spine, initial encounter: Secondary | ICD-10-CM | POA: Diagnosis not present

## 2020-05-23 DIAGNOSIS — S233XXA Sprain of ligaments of thoracic spine, initial encounter: Secondary | ICD-10-CM | POA: Diagnosis not present

## 2020-05-23 DIAGNOSIS — S134XXA Sprain of ligaments of cervical spine, initial encounter: Secondary | ICD-10-CM | POA: Diagnosis not present

## 2020-05-23 DIAGNOSIS — M9901 Segmental and somatic dysfunction of cervical region: Secondary | ICD-10-CM | POA: Diagnosis not present

## 2020-05-23 DIAGNOSIS — M9902 Segmental and somatic dysfunction of thoracic region: Secondary | ICD-10-CM | POA: Diagnosis not present

## 2020-05-23 DIAGNOSIS — M9903 Segmental and somatic dysfunction of lumbar region: Secondary | ICD-10-CM | POA: Diagnosis not present

## 2020-05-24 DIAGNOSIS — I509 Heart failure, unspecified: Secondary | ICD-10-CM | POA: Diagnosis not present

## 2020-05-24 DIAGNOSIS — I5023 Acute on chronic systolic (congestive) heart failure: Secondary | ICD-10-CM | POA: Diagnosis not present

## 2020-05-24 DIAGNOSIS — I5033 Acute on chronic diastolic (congestive) heart failure: Secondary | ICD-10-CM | POA: Diagnosis not present

## 2020-05-25 DIAGNOSIS — G4733 Obstructive sleep apnea (adult) (pediatric): Secondary | ICD-10-CM | POA: Diagnosis not present

## 2020-05-27 ENCOUNTER — Other Ambulatory Visit: Payer: Self-pay | Admitting: Cardiology

## 2020-05-30 ENCOUNTER — Emergency Department (HOSPITAL_COMMUNITY)
Admission: EM | Admit: 2020-05-30 | Discharge: 2020-05-30 | Disposition: A | Discharge status: Home / Self Care | Payer: Medicare HMO | Attending: Emergency Medicine | Admitting: Emergency Medicine

## 2020-05-30 ENCOUNTER — Other Ambulatory Visit: Payer: Self-pay | Admitting: *Deleted

## 2020-05-30 ENCOUNTER — Other Ambulatory Visit: Payer: Self-pay

## 2020-05-30 ENCOUNTER — Emergency Department (HOSPITAL_COMMUNITY): Payer: Medicare HMO

## 2020-05-30 ENCOUNTER — Encounter (HOSPITAL_COMMUNITY): Payer: Self-pay | Admitting: Emergency Medicine

## 2020-05-30 DIAGNOSIS — R072 Precordial pain: Secondary | ICD-10-CM | POA: Insufficient documentation

## 2020-05-30 DIAGNOSIS — Z7982 Long term (current) use of aspirin: Secondary | ICD-10-CM | POA: Insufficient documentation

## 2020-05-30 DIAGNOSIS — N183 Chronic kidney disease, stage 3 unspecified: Secondary | ICD-10-CM | POA: Diagnosis not present

## 2020-05-30 DIAGNOSIS — Z794 Long term (current) use of insulin: Secondary | ICD-10-CM | POA: Diagnosis not present

## 2020-05-30 DIAGNOSIS — J449 Chronic obstructive pulmonary disease, unspecified: Secondary | ICD-10-CM | POA: Insufficient documentation

## 2020-05-30 DIAGNOSIS — E1140 Type 2 diabetes mellitus with diabetic neuropathy, unspecified: Secondary | ICD-10-CM | POA: Insufficient documentation

## 2020-05-30 DIAGNOSIS — R0789 Other chest pain: Secondary | ICD-10-CM | POA: Diagnosis not present

## 2020-05-30 DIAGNOSIS — M9903 Segmental and somatic dysfunction of lumbar region: Secondary | ICD-10-CM | POA: Diagnosis not present

## 2020-05-30 DIAGNOSIS — I130 Hypertensive heart and chronic kidney disease with heart failure and stage 1 through stage 4 chronic kidney disease, or unspecified chronic kidney disease: Secondary | ICD-10-CM | POA: Diagnosis not present

## 2020-05-30 DIAGNOSIS — E113393 Type 2 diabetes mellitus with moderate nonproliferative diabetic retinopathy without macular edema, bilateral: Secondary | ICD-10-CM | POA: Insufficient documentation

## 2020-05-30 DIAGNOSIS — E1122 Type 2 diabetes mellitus with diabetic chronic kidney disease: Secondary | ICD-10-CM | POA: Diagnosis not present

## 2020-05-30 DIAGNOSIS — I5023 Acute on chronic systolic (congestive) heart failure: Secondary | ICD-10-CM | POA: Diagnosis not present

## 2020-05-30 DIAGNOSIS — S335XXA Sprain of ligaments of lumbar spine, initial encounter: Secondary | ICD-10-CM | POA: Diagnosis not present

## 2020-05-30 DIAGNOSIS — S134XXA Sprain of ligaments of cervical spine, initial encounter: Secondary | ICD-10-CM | POA: Diagnosis not present

## 2020-05-30 DIAGNOSIS — R079 Chest pain, unspecified: Secondary | ICD-10-CM

## 2020-05-30 DIAGNOSIS — M9901 Segmental and somatic dysfunction of cervical region: Secondary | ICD-10-CM | POA: Diagnosis not present

## 2020-05-30 DIAGNOSIS — I2510 Atherosclerotic heart disease of native coronary artery without angina pectoris: Secondary | ICD-10-CM | POA: Diagnosis not present

## 2020-05-30 DIAGNOSIS — M9902 Segmental and somatic dysfunction of thoracic region: Secondary | ICD-10-CM | POA: Diagnosis not present

## 2020-05-30 DIAGNOSIS — Z79899 Other long term (current) drug therapy: Secondary | ICD-10-CM | POA: Diagnosis not present

## 2020-05-30 DIAGNOSIS — S233XXA Sprain of ligaments of thoracic spine, initial encounter: Secondary | ICD-10-CM | POA: Diagnosis not present

## 2020-05-30 DIAGNOSIS — Z1211 Encounter for screening for malignant neoplasm of colon: Secondary | ICD-10-CM

## 2020-05-30 LAB — BASIC METABOLIC PANEL
Anion gap: 8 (ref 5–15)
BUN: 44 mg/dL — ABNORMAL HIGH (ref 8–23)
CO2: 30 mmol/L (ref 22–32)
Calcium: 9.7 mg/dL (ref 8.9–10.3)
Chloride: 97 mmol/L — ABNORMAL LOW (ref 98–111)
Creatinine, Ser: 1.63 mg/dL — ABNORMAL HIGH (ref 0.61–1.24)
GFR calc Af Amer: 51 mL/min — ABNORMAL LOW (ref >60)
GFR calc non Af Amer: 44 mL/min — ABNORMAL LOW (ref >60)
Glucose, Bld: 218 mg/dL — ABNORMAL HIGH (ref 70–99)
Potassium: 4.2 mmol/L (ref 3.5–5.1)
Sodium: 135 mmol/L (ref 135–145)

## 2020-05-30 LAB — CBC
HCT: 33.3 % — ABNORMAL LOW (ref 39.0–52.0)
Hemoglobin: 11 g/dL — ABNORMAL LOW (ref 13.0–17.0)
MCH: 29.8 pg (ref 26.0–34.0)
MCHC: 33 g/dL (ref 30.0–36.0)
MCV: 90.2 fL (ref 80.0–100.0)
Platelets: 191 10*3/uL (ref 150–400)
RBC: 3.69 MIL/uL — ABNORMAL LOW (ref 4.22–5.81)
RDW: 15.5 % (ref 11.5–15.5)
WBC: 5.4 10*3/uL (ref 4.0–10.5)
nRBC: 0 % (ref 0.0–0.2)

## 2020-05-30 LAB — TROPONIN I (HIGH SENSITIVITY)
Troponin I (High Sensitivity): 59 ng/L — ABNORMAL HIGH (ref <18)
Troponin I (High Sensitivity): 66 ng/L — ABNORMAL HIGH (ref <18)

## 2020-05-30 MED ORDER — KETOROLAC TROMETHAMINE 30 MG/ML IJ SOLN
30.0000 mg | Freq: Once | INTRAMUSCULAR | Status: AC
  Administered 2020-05-30: 30 mg via INTRAVENOUS

## 2020-05-30 MED ORDER — KETOROLAC TROMETHAMINE 60 MG/2ML IM SOLN
60.0000 mg | Freq: Once | INTRAMUSCULAR | Status: DC
  Filled 2020-05-30: qty 2

## 2020-05-30 MED ORDER — HYDROMORPHONE HCL 1 MG/ML IJ SOLN: 1.0000 mg | Freq: Once | INTRAMUSCULAR | Status: DC

## 2020-05-30 NOTE — ED Triage Notes (Signed)
Pt c/o chest pain, neck & right shoulder x2 days. Pt took nitro yesterday with no relief but did not take any today.

## 2020-05-30 NOTE — Discharge Instructions (Addendum)
You were seen in the emergency department for a few days of constant chest pain.  You had blood work EKG and a chest x-ray.  Your troponins were mildly elevated but flat.  Your symptoms improved with anti-inflammatory medication.  This is likely muscular chest pain.  Please contact your doctors for close follow-up.  Return to the emergency department if any worsening or concerning symptoms

## 2020-05-30 NOTE — ED Provider Notes (Signed)
[facility] EMERGENCY DEPARTMENT Provider Note   CSN: [id number] Arrival date & time: 05/30/20  1345     History Chief Complaint  Patient presents with   Chest Pain    Eugene [name] is a 64 y.o. male. He has a history of cardiac disease and studies had 3 heart attacks. He is complaining of 3 days of some right chest and scapular pain that is similar to his cardiac complaints. Says it is worse with palpation. Moderate in intensity. He tried nitro and aspirin yesterday without any relief. Says he has not been is able to sleep secondary to the pain. Not associated with any dizziness shortness of breath diaphoresis nausea or vomiting.  The history is provided by the patient.  Chest Pain Pain location:  R chest and substernal area Pain quality: aching   Pain radiates to:  R shoulder Pain severity:  Moderate Onset quality:  Gradual Timing:  Constant Progression:  Unchanged Chronicity:  New Relieved by:  Nothing Worsened by:  Movement and certain positions Ineffective treatments:  Aspirin and nitroglycerin Associated symptoms: no abdominal pain, no cough, no diaphoresis, no fever, no headache, no nausea, no shortness of breath and no vomiting   Risk factors: coronary artery disease, diabetes mellitus, hypertension and male sex     HPI: A 64 year old patient with a history of treated diabetes, hypertension and obesity presents for evaluation of chest pain. Initial onset of pain was more than 6 hours ago. The patient's chest pain is described as heaviness/pressure/tightness and is not worse with exertion. The patient's chest pain is not middle- or left-sided, is not well-localized, is not sharp and does radiate to the arms/jaw/neck. The patient does not complain of nausea and denies diaphoresis. The patient has no history of stroke, has no history of peripheral artery disease, has not smoked in the past 90 days, has no relevant family history of coronary artery disease (first degree  relative at less than age 101) and has no history of hypercholesterolemia.   Past Medical History:  Diagnosis Date   Anemia    Anxiety    Anxiety    Arteriosclerotic cardiovascular disease (ASCVD)    a. 05/2011 s/p DES to PDA and RCA. b. 08/2012 Inflat  STEMI/Cath/PCI: LM minor irregs, LAD 50p, D1 50, LCX nl, OM1 25, RCA 30-40p, 100d (treated with 2.75x31mm Promus Premier DES);     Asthma    Bell palsy    C. difficile colitis    a. 08/2012   Cholelithiasis 07/2012   Asymptomatic; identified incidentally   Chronic diastolic CHF (congestive heart failure) (HCC)    CKD (chronic kidney disease), stage III (HCC)    Contrast dye induced nephropathy    a. 08/2012 post cath/pci   COPD (chronic obstructive pulmonary disease) (HCC)    Diabetes mellitus    Peripheral neuropathy   Gallstones    GERD (gastroesophageal reflux disease)    Gout    Hyperlipidemia    Hypertension    Myocardial infarct ([place]) 09/08/12   Nephrolithiasis    Old myocardial infarction    Pericardial effusion    a. s/p window in 2014. b. s/p redo window in 09/2018.   Peripheral neuropathy    PONV (postoperative nausea and vomiting)    Sleep apnea    Statin intolerance     Patient Active Problem List   Diagnosis Date Noted   Chronic renal disease, stage III ([place]) 03/28/2020   Chronic respiratory failure ([place]) 03/18/2020   Unstable angina ([place]) 03/12/2020  NSTEMI (non-ST elevated myocardial infarction) ([place]) 03/12/2020   Kidney stones 10/28/2019   Diabetes mellitus due to underlying condition, uncontrolled, with hyperglycemia, with long-term current use of insulin ([place]) 02/24/2019   Acute exacerbation of CHF (congestive heart failure) /dCHF 02/22/2019   S/P pericardial window creation 10/01/2018   DM type 2 causing CKD stage 3 ([place]) 11/14/2016   Acute on chronic systolic congestive heart failure (HCC)    Cardiomyopathy ([place])    Acute on chronic diastolic CHF (congestive heart  failure) ([place]) 11/10/2016   Elevated troponin    Coronary artery disease involving native coronary artery of native heart without angina pectoris    Moderate nonproliferative diabetic retinopathy of both eyes without macular edema associated with type 2 diabetes mellitus ([place]) 10/22/2016   Hyperkalemia 06/14/2016   Morbid obesity ([place])    Diabetes mellitus type 2, uncontrolled, with complications ([place]) 21/19/4174   Right carotid bruit 11/10/2015   Severe hypertension 11/09/2015   Hypertensive urgency 11/09/2015   Gout of wrist 08/08/2015   Pericardial effusion without cardiac tamponade 05/26/2015   HLD (hyperlipidemia) 05/26/2015   Anxiety 05/26/2015   Olecranon bursitis    Inflammatory arthritis    Elbow joint pain 01/21/2015   SOB (shortness of breath) on exertion 12/26/2014   Constipation 10/26/2014   Peripheral edema 01/29/2014   Acute on chronic diastolic heart failure ([place]) 01/26/2014   Chest pain 12/13/2013   SOB (shortness of breath) 10/16/2013   Chronic diastolic CHF (congestive heart failure) ([place]) 10/15/2013   NSVT (nonsustained ventricular tachycardia) ([place]) 06/26/2013   Type 2 diabetes mellitus with neurological manifestations ([place]) 04/09/4817   Acute diastolic CHF (congestive heart failure) ([place]) 06/26/2013   Recurrent Pericardial effusion/pericardial window on 06/23/2013 and 10/01/2018 06/26/2013   Old myocardial infarction    Gastroparesis due to DM ([place]) 05/21/2013   CAD (coronary artery disease) 09/16/2012   Acute on chronic renal insufficiency 09/14/2012   Edema 08/18/2012   Anemia, normocytic normochromic 07/31/2012   Cholelithiasis 07/27/2012   GERD (gastroesophageal reflux disease) 05/10/2012   Noncompliance 05/09/2012   Peripheral neuropathy 10/24/2011   Obstructive sleep apnea- on C-pap 08/23/2011   CAD S/P percutaneous coronary angioplasty    Hyperlipidemia    Cholelithiasis 05/28/2011   Essential hypertension  05/25/2011    Past Surgical History:  Procedure Laterality Date   CARDIAC CATHETERIZATION     CHEST TUBE INSERTION     CIRCUMCISION     COLONOSCOPY     In [facility], approximately 2011 per patient, was normal. Advised to come back in 10 years.   ESOPHAGOGASTRODUODENOSCOPY     in [place] over 20 yrs ago   ESOPHAGOGASTRODUODENOSCOPY  06/12/2012   HUD:JSHFWY esophagus-status post [facility] dilation. Abnormal gastric mucosa of uncertain significance-status post biopsy   EYE SURGERY     INTRAOPERATIVE TRANSESOPHAGEAL ECHOCARDIOGRAM N/A 06/23/2013   Procedure: INTRAOPERATIVE TRANSESOPHAGEAL ECHOCARDIOGRAM;  Surgeon: Grace Isaac, MD;  Location: [place];  Service: Open Heart Surgery;  Laterality: N/A;   LEFT HEART CATH AND CORONARY ANGIOGRAPHY N/A 03/14/2020   Procedure: LEFT HEART CATH AND CORONARY ANGIOGRAPHY;  Surgeon: Lorretta Harp, MD;  Location: [place] CV LAB;  Service: Cardiovascular;  Laterality: N/A;   LEFT HEART CATHETERIZATION WITH CORONARY ANGIOGRAM N/A 09/08/2012   Procedure: LEFT HEART CATHETERIZATION WITH CORONARY ANGIOGRAM;  Surgeon: [name], MD;  Location: [facility] CATH LAB;  Service: Cardiovascular;  Laterality: N/A;   stents     SUBXYPHOID PERICARDIAL WINDOW N/A 06/23/2013   Procedure: SUBXYPHOID PERICARDIAL WINDOW;  Surgeon: Percell Miller  Maryruth Bun, MD;  Location: [place];  Service: Thoracic;  Laterality: N/A;   SUBXYPHOID PERICARDIAL WINDOW  10/01/2018   SUBXYPHOID PERICARDIAL WINDOW N/A 10/01/2018   Procedure: REDO SUBXYPHOID PERICARDIAL WINDOW;  Surgeon: Grace Isaac, MD;  Location: [place];  Service: Thoracic;  Laterality: N/A;   TEE WITHOUT CARDIOVERSION N/A 10/01/2018   Procedure: TRANSESOPHAGEAL ECHOCARDIOGRAM (TEE);  Surgeon: Grace Isaac, MD;  [facility];  Service: Thoracic;  Laterality: N/A;       Family History  Problem Relation Age of Onset   Diabetes Mother    Heart attack Mother    Stroke Mother     Diabetes Sister    Sleep apnea Sister    Hypertension Brother    Diabetes Brother    Diabetes Brother    Hypertension Brother    Colon cancer Neg Hx    Liver disease Neg Hx     Social History   Tobacco Use   Smoking status: Never Smoker   Smokeless tobacco: Never Used  Scientific laboratory technician Use: Never used  Substance Use Topics   Alcohol use: No    Alcohol/week: 0.0 standard drinks    Comment: heavy etoh use 30 years ago   Drug use: No    Home Medications Prior to Admission medications   Medication Sig Start Date End Date Taking? Authorizing Provider  acetaminophen (TYLENOL) 500 MG tablet Take 1,500 mg by mouth every 6 (six) hours as needed for mild pain, fever or headache.     [provider]  allopurinol (ZYLOPRIM) 100 MG tablet Take 100 mg by mouth as needed.    [provider]  ALPRAZolam (XANAX) 0.5 MG tablet TAKE 1-2 TABLETS (0.5-1 MG TOTAL) BY MOUTH AT BEDTIME AS NEEDED FOR SLEEP. 02/29/20   [name], MD  aspirin 81 MG chewable tablet Chew 81 mg by mouth at bedtime.    [provider]  clopidogrel (PLAVIX) 75 MG tablet Take 1 tablet (75 mg total) by mouth daily. 03/28/20   Erlene Quan, PA-C  colchicine 0.6 MG tablet 1 twice daily as needed for gout flareup 07/30/19   Luking, Elayne Snare, MD  DROPLET PEN NEEDLES 32G X 4 MM MISC USE EVERY DAY AS DIRECTED 05/05/20   Luking, Elayne Snare, MD  Evolocumab (REPATHA SURECLICK) 193 MG/ML SOAJ Inject 1 Dose into the skin every 14 (fourteen) days. 03/30/20   Hilty, Nadean Corwin, MD  glimepiride (AMARYL) 1 MG tablet TAKE 1 TABLET WITH MEALS AS DIRECTED. MAX 3 TABLETS PER DAY 12/31/19   [name], MD  isosorbide mononitrate (IMDUR) 60 MG 24 hr tablet Take 1 tablet (60 mg total) by mouth daily. 05/18/20   Skeet Latch, MD  LEVEMIR FLEXTOUCH 100 UNIT/ML Pen INJECT 80 UNITS INTO THE SKIN DAILY AT 10 PM. Patient taking differently: Inject 60 Units into the skin at bedtime.  09/08/19   [name], MD    losartan (COZAAR) 25 MG tablet TAKE 1 TABLET (25 MG TOTAL) BY MOUTH DAILY. 03/18/20   Erlene Quan, PA-C  metoprolol succinate (TOPROL-XL) 50 MG 24 hr tablet Take 1 tablet (50 mg total) by mouth daily. Take with or immediately following a meal. 03/28/20   Kilroy, Doreene Burke, PA-C  nitroGLYCERIN (NITROSTAT) 0.4 MG SL tablet Place 1 tablet (0.4 mg total) under the tongue every 5 (five) minutes as needed for chest pain. 03/18/20   Erlene Quan, PA-C  ofloxacin (FLOXIN) 0.3 % OTIC solution 5 drops as  needed.    [provider]  torsemide (DEMADEX) 20 MG tablet Take 40 mg by mouth daily.    [provider]  traMADol (ULTRAM) 50 MG tablet TAKE 1 TABLET (50 MG TOTAL) BY MOUTH EVERY 6 (SIX) HOURS AS NEEDED 05/03/20   [name], MD    Allergies    Hydrocodone, Lisinopril, Neurontin [gabapentin], Statins, Metformin and related, and Norvasc [amlodipine besylate]  Review of Systems   Review of Systems  Constitutional: Negative for diaphoresis and fever.  HENT: Negative for sore throat.   Eyes: Negative for visual disturbance.  Respiratory: Negative for cough and shortness of breath.   Cardiovascular: Positive for chest pain.  Gastrointestinal: Negative for abdominal pain, nausea and vomiting.  Genitourinary: Negative for dysuria.  Musculoskeletal: Negative for neck pain.  Skin: Negative for rash.  Neurological: Negative for headaches.    Physical Exam Updated Vital Signs Ht 6\' 1"  (1.854 m)    Wt 116.1 kg    BMI 33.78 kg/m   Physical Exam Vitals and nursing note reviewed.  Constitutional:      Appearance: He is well-developed.  HENT:     Head: Normocephalic and atraumatic.  Eyes:     Conjunctiva/sclera: Conjunctivae normal.  Cardiovascular:     Rate and Rhythm: Normal rate and regular rhythm.     Heart sounds: Normal heart sounds. No murmur heard.   Pulmonary:     Effort: Pulmonary effort is normal. No respiratory distress.     Breath sounds: Normal breath sounds.   Chest:     Chest wall: Tenderness present.  Abdominal:     Palpations: Abdomen is soft.     Tenderness: There is no abdominal tenderness.  Musculoskeletal:        General: Normal range of motion.     Cervical back: Neck supple.     Right lower leg: No tenderness.     Left lower leg: No tenderness.  Skin:    General: Skin is warm and dry.     Capillary Refill: Capillary refill takes less than 2 seconds.  Neurological:     General: No focal deficit present.     Mental Status: He is alert.     ED Results / Procedures / Treatments   Labs (all labs ordered are listed, but only abnormal results are displayed) Labs Reviewed  BASIC METABOLIC PANEL - Abnormal; Notable for the following components:      Result Value   Chloride 97 (*)    Glucose, Bld 218 (*)    BUN 44 (*)    Creatinine, Ser 1.63 (*)    GFR calc non Af Amer 44 (*)    GFR calc Af Amer 51 (*)    All other components within normal limits  CBC - Abnormal; Notable for the following components:   RBC 3.69 (*)    Hemoglobin 11.0 (*)    HCT 33.3 (*)    All other components within normal limits  TROPONIN I (HIGH SENSITIVITY) - Abnormal; Notable for the following components:   Troponin I (High Sensitivity) 59 (*)    All other components within normal limits  TROPONIN I (HIGH SENSITIVITY)    EKG EKG Interpretation  Date/Time:  Monday May 30 2020 13:52:38 EDT Ventricular Rate:  82 PR Interval:  224 QRS Duration: 106 QT Interval:  388 QTC Calculation: 453 R Axis:   -91 Text Interpretation: Sinus rhythm with 1st degree A-V block Right superior axis deviation Low voltage QRS Incomplete right bundle branch block  Cannot rule out Anteroseptal infarct , age undetermined Abnormal ECG Confirmed by Milton Ferguson [phone]) on 05/30/2020 2:17:32 PM   Radiology DG Chest 2 View  Result Date: 05/30/2020 CLINICAL DATA:  Chest pain EXAM: CHEST - 2 VIEW COMPARISON:  03/12/2020 FINDINGS: The heart size and mediastinal contours  are within normal limits. Mild streaky left basilar opacity, favor atelectasis. No pleural effusion or pneumothorax. The visualized skeletal structures are unremarkable. IMPRESSION: Mild streaky left basilar opacity, favor atelectasis. Electronically Signed   By: Davina Poke D.O.   On: 05/30/2020 14:24    Procedures Procedures (including critical care time)  Medications Ordered in ED Medications - No data to display  ED Course  I have reviewed the triage vital signs and the nursing notes.  Pertinent labs & imaging results that were available during my care of the patient were reviewed by me and considered in my medical decision making (see chart for details).  Clinical Course as of May 31 1001  Mon May 30, 2020  1903 Patient initially denied having any sort of trauma.  He now remembers he lifted up some heavy groceries on Friday nights when the pain started Saturday.   [MB]  1924 Patient's troponins are minimally elevated and rose only from 56-65.  His chest pain has been ongoing for a few days.  Doubt this represents an NSTEMI.  Have given him a dose of some Toradol.  Will reassess.   [MB]  1935   Coronary angiogram 03/14/2020-  Previously placed Mid RCA to Dist RCA stent (unknown type) is widely patent.  Prox Cx lesion is 50% stenosed.  Mid RCA lesion is 40% stenosed.  Mid LAD-1 lesion is 50% stenosed.  Mid LAD-2 lesion is 100% stenosed with 100% stenosed side branch in 2nd Sept.  Mid LAD-3 lesion is 100% stenosed.  Plan medical Rx after out of hospital NSTEMI secondary to mLAD occlusion.   [MB]  2036 After Toradol patient states he feels much better.  He is comfortable with plan for discharge and following up with his PCP and cardiologist.  Return instructions discussed   [MB]    Clinical Course User Index [MB] Hayden Rasmussen, MD   MDM Rules/Calculators/A&P HEAR Score: 5                       This patient complains of right-sided chest pain this involves an  extensive number of treatment Options and is a complaint that carries with it a high risk of complications and Morbidity. The differential includes ACS, PE, pneumothorax, vascular, musculoskeletal, GERD  I ordered, reviewed and interpreted labs, which included CBC with normal white count, hemoglobin low better than baseline, chemistries with elevated blood sugar elevated BUN and creatinine similar to priors, troponins mildly elevated but essentially flat I ordered medication IV Toradol with improvement in his pain I ordered imaging studies which included chest x-ray and I independently    visualized and interpreted imaging which showed atelectasis, no gross infiltrates Previous records obtained and reviewed in epic including recent cardiology clinic visit  After the interventions stated above, I reevaluated the patient and found patient symptoms to be much improved after Toradol.  Recommended close follow-up with his primary and cardiologist.  Patient agreement with plan.  Return instructions discussed   Final Clinical Impression(s) / ED Diagnoses Final diagnoses:  Nonspecific chest pain    Rx / DC Orders ED Discharge Orders    None       Hayden Rasmussen,  MD 05/31/20 1005

## 2020-05-31 ENCOUNTER — Telehealth: Payer: Self-pay

## 2020-05-31 MED ORDER — TORSEMIDE 20 MG PO TABS: ORAL_TABLET | ORAL | 0 refills | Status: DC

## 2020-05-31 NOTE — Telephone Encounter (Signed)
90 day refill.

## 2020-05-31 NOTE — Telephone Encounter (Signed)
Pt is requesting refill on torsemide (DEMADEX) 20 MG tablet    [facility] - [place], [place] - [address]

## 2020-05-31 NOTE — Telephone Encounter (Signed)
Medication sent to pharmacy and pt is aware 

## 2020-05-31 NOTE — Telephone Encounter (Signed)
Please advise. Thank you

## 2020-06-01 ENCOUNTER — Other Ambulatory Visit: Payer: Self-pay | Admitting: Family Medicine

## 2020-06-03 ENCOUNTER — Encounter: Payer: Self-pay | Admitting: Family Medicine

## 2020-06-06 ENCOUNTER — Encounter: Payer: Self-pay | Admitting: Internal Medicine

## 2020-06-10 ENCOUNTER — Ambulatory Visit: Payer: Medicare HMO | Admitting: Internal Medicine

## 2020-06-15 DIAGNOSIS — S335XXA Sprain of ligaments of lumbar spine, initial encounter: Secondary | ICD-10-CM | POA: Diagnosis not present

## 2020-06-15 DIAGNOSIS — S233XXA Sprain of ligaments of thoracic spine, initial encounter: Secondary | ICD-10-CM | POA: Diagnosis not present

## 2020-06-15 DIAGNOSIS — M9901 Segmental and somatic dysfunction of cervical region: Secondary | ICD-10-CM | POA: Diagnosis not present

## 2020-06-15 DIAGNOSIS — S134XXA Sprain of ligaments of cervical spine, initial encounter: Secondary | ICD-10-CM | POA: Diagnosis not present

## 2020-06-15 DIAGNOSIS — M9903 Segmental and somatic dysfunction of lumbar region: Secondary | ICD-10-CM | POA: Diagnosis not present

## 2020-06-15 DIAGNOSIS — M9902 Segmental and somatic dysfunction of thoracic region: Secondary | ICD-10-CM | POA: Diagnosis not present

## 2020-06-16 DIAGNOSIS — I129 Hypertensive chronic kidney disease with stage 1 through stage 4 chronic kidney disease, or unspecified chronic kidney disease: Secondary | ICD-10-CM | POA: Diagnosis not present

## 2020-06-16 DIAGNOSIS — E1129 Type 2 diabetes mellitus with other diabetic kidney complication: Secondary | ICD-10-CM | POA: Diagnosis not present

## 2020-06-16 DIAGNOSIS — R768 Other specified abnormal immunological findings in serum: Secondary | ICD-10-CM | POA: Diagnosis not present

## 2020-06-16 DIAGNOSIS — E1122 Type 2 diabetes mellitus with diabetic chronic kidney disease: Secondary | ICD-10-CM | POA: Diagnosis not present

## 2020-06-16 DIAGNOSIS — I5042 Chronic combined systolic (congestive) and diastolic (congestive) heart failure: Secondary | ICD-10-CM | POA: Diagnosis not present

## 2020-06-16 DIAGNOSIS — D631 Anemia in chronic kidney disease: Secondary | ICD-10-CM | POA: Diagnosis not present

## 2020-06-16 DIAGNOSIS — N189 Chronic kidney disease, unspecified: Secondary | ICD-10-CM | POA: Diagnosis not present

## 2020-06-16 DIAGNOSIS — R809 Proteinuria, unspecified: Secondary | ICD-10-CM | POA: Diagnosis not present

## 2020-06-20 DIAGNOSIS — I5033 Acute on chronic diastolic (congestive) heart failure: Secondary | ICD-10-CM | POA: Diagnosis not present

## 2020-06-20 DIAGNOSIS — I5023 Acute on chronic systolic (congestive) heart failure: Secondary | ICD-10-CM | POA: Diagnosis not present

## 2020-06-20 DIAGNOSIS — I509 Heart failure, unspecified: Secondary | ICD-10-CM | POA: Diagnosis not present

## 2020-06-20 DIAGNOSIS — G4733 Obstructive sleep apnea (adult) (pediatric): Secondary | ICD-10-CM | POA: Diagnosis not present

## 2020-06-23 DIAGNOSIS — I5033 Acute on chronic diastolic (congestive) heart failure: Secondary | ICD-10-CM | POA: Diagnosis not present

## 2020-06-23 DIAGNOSIS — I509 Heart failure, unspecified: Secondary | ICD-10-CM | POA: Diagnosis not present

## 2020-06-23 DIAGNOSIS — I5023 Acute on chronic systolic (congestive) heart failure: Secondary | ICD-10-CM | POA: Diagnosis not present

## 2020-06-28 ENCOUNTER — Other Ambulatory Visit: Payer: Self-pay | Admitting: Family Medicine

## 2020-06-30 ENCOUNTER — Telehealth: Payer: Self-pay | Admitting: Cardiovascular Disease

## 2020-06-30 NOTE — Telephone Encounter (Signed)
Happy to see him closer to home.    Zandra Abts MD

## 2020-06-30 NOTE — Telephone Encounter (Signed)
New Message:     Pt would like to switch from Dr Oval Linsey to Dr [name], The reason sfor this change is, because it will be closer to pt's home.

## 2020-07-01 NOTE — Telephone Encounter (Signed)
OK that's fine

## 2020-07-01 NOTE — Telephone Encounter (Signed)
Pt called to check on the status of the provider change

## 2020-07-06 ENCOUNTER — Telehealth: Payer: Self-pay

## 2020-07-06 ENCOUNTER — Other Ambulatory Visit: Payer: Self-pay | Admitting: Family Medicine

## 2020-07-06 ENCOUNTER — Ambulatory Visit: Payer: Medicare HMO | Admitting: Cardiovascular Disease

## 2020-07-06 NOTE — Telephone Encounter (Signed)
Please call and set up appt for tomorrow.

## 2020-07-06 NOTE — Telephone Encounter (Signed)
Pain in left kidney area,  urine smells a little strong, started 2 days ago. No fever. Pt states he has had these symptoms before and got something called in for it.

## 2020-07-06 NOTE — Telephone Encounter (Signed)
At the very least we need to do a phone visit for this please set him up for tomorrow with myself

## 2020-07-06 NOTE — Telephone Encounter (Signed)
Tried to call voice mail not set up

## 2020-07-06 NOTE — Telephone Encounter (Signed)
Pt is wanting something called in for kidney infection sent to [facility] please notify pt  Pt call [phone]

## 2020-07-07 ENCOUNTER — Ambulatory Visit: Payer: Medicare HMO | Admitting: Family Medicine

## 2020-07-11 ENCOUNTER — Ambulatory Visit: Payer: Medicare HMO | Admitting: Family Medicine

## 2020-07-12 ENCOUNTER — Encounter: Payer: Self-pay | Admitting: Podiatry

## 2020-07-12 ENCOUNTER — Other Ambulatory Visit: Payer: Self-pay

## 2020-07-12 ENCOUNTER — Ambulatory Visit: Payer: Medicare HMO | Admitting: Podiatry

## 2020-07-12 DIAGNOSIS — B351 Tinea unguium: Secondary | ICD-10-CM

## 2020-07-12 DIAGNOSIS — M2142 Flat foot [pes planus] (acquired), left foot: Secondary | ICD-10-CM

## 2020-07-12 DIAGNOSIS — M79674 Pain in right toe(s): Secondary | ICD-10-CM

## 2020-07-12 DIAGNOSIS — E1142 Type 2 diabetes mellitus with diabetic polyneuropathy: Secondary | ICD-10-CM | POA: Diagnosis not present

## 2020-07-12 DIAGNOSIS — M2041 Other hammer toe(s) (acquired), right foot: Secondary | ICD-10-CM

## 2020-07-12 DIAGNOSIS — M79675 Pain in left toe(s): Secondary | ICD-10-CM

## 2020-07-12 DIAGNOSIS — M2141 Flat foot [pes planus] (acquired), right foot: Secondary | ICD-10-CM

## 2020-07-12 DIAGNOSIS — M2042 Other hammer toe(s) (acquired), left foot: Secondary | ICD-10-CM

## 2020-07-16 NOTE — Progress Notes (Signed)
Subjective: Eugene [name] presents today at risk foot care. Pt has h/o NIDDM with chronic kidney disease and painful mycotic nails b/l that are difficult to trim. Pain interferes with ambulation. Aggravating factors include wearing enclosed shoe gear. Pain is relieved with periodic professional debridement.   He relates his blood glucose was 104 mg/dl today. He states his right great toenail appears to be growing out.  [name], MD is patient's PCP. Last visit was: 03/10/2020.  Past Medical History:  Diagnosis Date  . Anemia   . Anxiety   . Anxiety   . Arteriosclerotic cardiovascular disease (ASCVD)    a. 05/2011 s/p DES to PDA and RCA. b. 08/2012 Inflat  STEMI/Cath/PCI: LM minor irregs, LAD 50p, D1 50, LCX nl, OM1 25, RCA 30-40p, 100d (treated with 2.75x26mm Promus Premier DES);    [name] Asthma   . Bell palsy   . C. difficile colitis    a. 08/2012  . Cholelithiasis 07/2012   Asymptomatic; identified incidentally  . Chronic diastolic CHF (congestive heart failure) ([place])   . CKD (chronic kidney disease), stage III ([place])   . Contrast dye induced nephropathy    a. 08/2012 post cath/pci  . COPD (chronic obstructive pulmonary disease) ([place])   . Diabetes mellitus    Peripheral neuropathy  . Gallstones   . GERD (gastroesophageal reflux disease)   . Gout   . Hyperlipidemia   . Hypertension   . Myocardial infarct ([place]) 09/08/12  . Nephrolithiasis   . Old myocardial infarction   . Pericardial effusion    a. s/p window in 2014. b. s/p redo window in 09/2018.  [name] Peripheral neuropathy   . PONV (postoperative nausea and vomiting)   . Sleep apnea   . Statin intolerance      Current Outpatient Medications on File Prior to Visit  Medication Sig Dispense Refill  . acetaminophen (TYLENOL) 500 MG tablet Take 1,500 mg by mouth every 6 (six) hours as needed for mild pain, fever or headache.     . allopurinol (ZYLOPRIM) 100 MG tablet Take by mouth.    . ALPRAZolam (XANAX) 0.5 MG tablet  TAKE 1-2 TABLETS (0.5-1 MG TOTAL) BY MOUTH AT BEDTIME AS NEEDED FOR SLEEP. 30 tablet 3  . aspirin 81 MG EC tablet Take by mouth.    . clopidogrel (PLAVIX) 75 MG tablet Take 1 tablet (75 mg total) by mouth daily. 90 tablet 3  . colchicine 0.6 MG tablet Take by mouth.    . DROPLET PEN NEEDLES 32G X 4 MM MISC USE EVERY DAY AS DIRECTED 100 each 0  . Evolocumab (REPATHA SURECLICK) 518 MG/ML SOAJ Inject 1 Dose into the skin every 14 (fourteen) days. 2 pen 11  . glimepiride (AMARYL) 1 MG tablet TAKE 1 TABLET WITH MEALS AS DIRECTED. MAX 3 TABLETS PER DAY 270 tablet 0  . insulin detemir (LEVEMIR) 100 UNIT/ML injection Inject into the skin.    [name] isosorbide mononitrate (IMDUR) 60 MG 24 hr tablet Take 1 tablet (60 mg total) by mouth daily. 90 tablet 3  . losartan (COZAAR) 25 MG tablet Take by mouth.    . metoprolol succinate (TOPROL-XL) 50 MG 24 hr tablet Take 1 tablet (50 mg total) by mouth daily. Take with or immediately following a meal. 90 tablet 3  . nitroGLYCERIN (NITRODUR - DOSED IN MG/24 HR) 0.4 mg/hr patch Place onto the skin.    [name] ofloxacin (FLOXIN) 0.3 % OTIC solution 5 drops as needed.    . torsemide (DEMADEX) 20  MG tablet Take by mouth.    . traMADol (ULTRAM) 50 MG tablet TAKE 1 TABLET (50 MG TOTAL) BY MOUTH EVERY 6 (SIX) HOURS AS NEEDED 24 tablet 2   No current facility-administered medications on file prior to visit.     Allergies  Allergen Reactions  . Hydrocodone Nausea And Vomiting  . Lisinopril Cough  . Neurontin [Gabapentin] Other (See Comments)    Reaction:  Suicidal thoughts   . Statins Other (See Comments)    Reaction:  Muscle pain   . Metformin And Related Diarrhea  . Norvasc [Amlodipine Besylate] Swelling and Other (See Comments)    Reaction:  Pedal edema    Objective: Eugene [name] is a pleasant 64 y.o. y.o. Patient Race: White or Caucasian [1]  male in NAD. AAO x 3.  There were no vitals filed for this visit.  Vascular Examination: Neurovascular status  unchanged b/l lower extremities. Capillary refill time to digits immediate b/l. Palpable DP pulses b/l. Palpable PT pulses b/l. Pedal hair sparse b/l. Skin temperature gradient within normal limits b/l.  Dermatological Examination: Pedal skin with normal turgor, texture and tone bilaterally. No open wounds bilaterally. No interdigital macerations bilaterally. Toenails 1-5 left, 2-5 right elongated, discolored, dystrophic, thickened, crumbly with subungual debris and tenderness to dorsal palpation.   Right great toenail is growing out proximal 50% with distal edge embedded. No erythema, no edema, no drainage, no fluctuance.  Musculoskeletal: Normal muscle strength 5/5 to all lower extremity muscle groups bilaterally. No pain crepitus or joint limitation noted with ROM b/l. Hammertoes noted to the 2-5 bilaterally. Pes planus deformity noted b/l.   Neurological Examination: Protective sensation diminished with 10g monofilament b/l. Vibratory sensation diminished b/l. Proprioception intact bilaterally.  Assessment: 1. Pain due to onychomycosis of toenails of both feet   2. Pes planus of both feet   3. Acquired hammertoes of both feet   4. Diabetic polyneuropathy associated with type 2 diabetes mellitus ([place])     Plan: -Examined patient. -Continue diabetic foot care principles. -Toenails 1-5 left, 2-5 rightl were debrided in length and girth with sterile nail nippers and dremel without iatrogenic bleeding.  -Curretaged distal edge of right great toenail. Cleansed with alcohol. Applied triple antibiotic ointment. Eugene [name] was instructed to apply Neosporin to digit once daily for one week. -Patient to continue soft, supportive shoe gear daily. -Patient to report any pedal injuries to medical professional immediately. -Patient/POA to call should there be question/concern in the interim.  Return in about 3 months (around 10/12/2020) for diabetic foot care.  Marzetta Board, DPM

## 2020-07-21 DIAGNOSIS — I509 Heart failure, unspecified: Secondary | ICD-10-CM | POA: Diagnosis not present

## 2020-07-21 DIAGNOSIS — I5033 Acute on chronic diastolic (congestive) heart failure: Secondary | ICD-10-CM | POA: Diagnosis not present

## 2020-07-21 DIAGNOSIS — I5023 Acute on chronic systolic (congestive) heart failure: Secondary | ICD-10-CM | POA: Diagnosis not present

## 2020-07-21 DIAGNOSIS — G4733 Obstructive sleep apnea (adult) (pediatric): Secondary | ICD-10-CM | POA: Diagnosis not present

## 2020-07-22 ENCOUNTER — Other Ambulatory Visit: Payer: Self-pay | Admitting: Family Medicine

## 2020-07-28 ENCOUNTER — Ambulatory Visit: Payer: Medicare HMO | Admitting: Nurse Practitioner

## 2020-08-01 ENCOUNTER — Encounter (INDEPENDENT_AMBULATORY_CARE_PROVIDER_SITE_OTHER): Payer: Medicare HMO | Admitting: Ophthalmology

## 2020-08-01 ENCOUNTER — Other Ambulatory Visit: Payer: Self-pay

## 2020-08-01 DIAGNOSIS — E113311 Type 2 diabetes mellitus with moderate nonproliferative diabetic retinopathy with macular edema, right eye: Secondary | ICD-10-CM | POA: Diagnosis not present

## 2020-08-01 DIAGNOSIS — H43813 Vitreous degeneration, bilateral: Secondary | ICD-10-CM | POA: Diagnosis not present

## 2020-08-01 DIAGNOSIS — H35033 Hypertensive retinopathy, bilateral: Secondary | ICD-10-CM

## 2020-08-01 DIAGNOSIS — E11311 Type 2 diabetes mellitus with unspecified diabetic retinopathy with macular edema: Secondary | ICD-10-CM | POA: Diagnosis not present

## 2020-08-01 DIAGNOSIS — E113512 Type 2 diabetes mellitus with proliferative diabetic retinopathy with macular edema, left eye: Secondary | ICD-10-CM | POA: Diagnosis not present

## 2020-08-01 DIAGNOSIS — I10 Essential (primary) hypertension: Secondary | ICD-10-CM | POA: Diagnosis not present

## 2020-08-02 ENCOUNTER — Encounter: Payer: Self-pay | Admitting: Family Medicine

## 2020-08-02 ENCOUNTER — Ambulatory Visit (INDEPENDENT_AMBULATORY_CARE_PROVIDER_SITE_OTHER): Payer: Medicare HMO | Admitting: Family Medicine

## 2020-08-02 VITALS — BP 126/86 | HR 91 | Temp 98.2°F | Ht 73.0 in | Wt 260.0 lb

## 2020-08-02 DIAGNOSIS — E1122 Type 2 diabetes mellitus with diabetic chronic kidney disease: Secondary | ICD-10-CM | POA: Diagnosis not present

## 2020-08-02 DIAGNOSIS — I5032 Chronic diastolic (congestive) heart failure: Secondary | ICD-10-CM

## 2020-08-02 DIAGNOSIS — E785 Hyperlipidemia, unspecified: Secondary | ICD-10-CM | POA: Diagnosis not present

## 2020-08-02 DIAGNOSIS — E1169 Type 2 diabetes mellitus with other specified complication: Secondary | ICD-10-CM | POA: Diagnosis not present

## 2020-08-02 DIAGNOSIS — I10 Essential (primary) hypertension: Secondary | ICD-10-CM | POA: Diagnosis not present

## 2020-08-02 DIAGNOSIS — Z794 Long term (current) use of insulin: Secondary | ICD-10-CM

## 2020-08-02 DIAGNOSIS — N1831 Chronic kidney disease, stage 3a: Secondary | ICD-10-CM | POA: Diagnosis not present

## 2020-08-02 MED ORDER — ALPRAZOLAM 0.5 MG PO TABS: 0.5000 mg | ORAL_TABLET | Freq: Every evening | ORAL | 5 refills | Status: AC | PRN

## 2020-08-02 MED ORDER — TRAMADOL HCL 50 MG PO TABS
ORAL_TABLET | ORAL | 5 refills | Status: DC
Start: 2020-08-02 — End: 2020-11-08

## 2020-08-02 NOTE — Progress Notes (Signed)
Subjective:    Patient ID: Eugene [name], male    DOB: October 27, 1955, 64 y.o.   MRN: [id number]  Hypertension This is a chronic problem. The current episode started more than 1 year ago. Pertinent negatives include no chest pain, headaches or shortness of breath. Risk factors for coronary artery disease include diabetes mellitus, dyslipidemia and male gender. Treatments tried: imdur, cozaar, toprol xl. There are no compliance problems.    Chronic diastolic CHF (congestive heart failure) (HCC)  Essential hypertension - Plan: Hepatic function panel  Type 2 diabetes mellitus with stage 3a chronic kidney disease, with long-term current use of insulin ([place]) - Plan: Hepatic function panel, Hemoglobin A1c  Hyperlipidemia associated with type 2 diabetes mellitus ([place]) - Plan: Hepatic function panel Has history of CHF denies any PND orthopnea currently states his swelling in his legs under good control States his sugars have been decently controlled recently denies any low sugars. Unable to afford Repatha currently.  Unable to tolerate statins.    Review of Systems  Constitutional: Negative for diaphoresis and fatigue.  HENT: Negative for congestion and rhinorrhea.   Respiratory: Negative for cough and shortness of breath.   Cardiovascular: Negative for chest pain and leg swelling.  Gastrointestinal: Negative for abdominal pain and diarrhea.  Skin: Negative for color change and rash.  Neurological: Negative for dizziness and headaches.  Psychiatric/Behavioral: Negative for behavioral problems and confusion.       Objective:   Physical Exam Vitals reviewed.  Constitutional:      General: He is not in acute distress. HENT:     Head: Normocephalic and atraumatic.  Eyes:     General:        Right eye: No discharge.        Left eye: No discharge.  Neck:     Trachea: No tracheal deviation.  Cardiovascular:     Rate and Rhythm: Normal rate and regular rhythm.     Heart sounds:  Normal heart sounds. No murmur heard.   Pulmonary:     Effort: Pulmonary effort is normal. No respiratory distress.     Breath sounds: Normal breath sounds.  Lymphadenopathy:     Cervical: No cervical adenopathy.  Skin:    General: Skin is warm and dry.  Neurological:     Mental Status: He is alert.     Coordination: Coordination normal.  Psychiatric:        Behavior: Behavior normal.            Assessment & Plan:  1. Chronic diastolic CHF (congestive heart failure) (HCC) Under good control currently no sign of any active acute CHF.  Warning signs were discussed regarding proper diet watching for swelling shortness of breath and continuing his medications as is  2. Essential hypertension Blood pressure good control continue current measures. - Hepatic function panel  3. Type 2 diabetes mellitus with stage 3a chronic kidney disease, with long-term current use of insulin (HCC) Diabetes has been under good control he does need to check an A1c lab was ordered he will do this soon - Hepatic function panel - Hemoglobin A1c  4. Hyperlipidemia associated with type 2 diabetes mellitus ([place]) History hyperlipidemia associated with diabetes he cannot afford the Repatha currently hopes to be able to afford this early next year   - Hepatic function panel  Uses tramadol on a as needed basis not excessively for arthralgias this is permissible. He is aware of potential for drowsiness and he is also aware not to  overuse it to minimize risk of addiction  Occasional alprazolam to help him sleep at night when he feels overly stressed or anxious.

## 2020-08-03 ENCOUNTER — Encounter: Payer: Self-pay | Admitting: Family Medicine

## 2020-08-03 LAB — SPECIMEN STATUS REPORT

## 2020-08-03 LAB — HEPATIC FUNCTION PANEL
ALT: 9 IU/L (ref 0–44)
AST: 15 IU/L (ref 0–40)
Albumin: 3.9 g/dL (ref 3.8–4.8)
Alkaline Phosphatase: 80 IU/L (ref 44–121)
Bilirubin Total: 0.6 mg/dL (ref 0.0–1.2)
Bilirubin, Direct: 0.16 mg/dL (ref 0.00–0.40)
Total Protein: 6.7 g/dL (ref 6.0–8.5)

## 2020-08-03 LAB — HEMOGLOBIN A1C
Est. average glucose Bld gHb Est-mCnc: 160 mg/dL
Hgb A1c MFr Bld: 7.2 % — ABNORMAL HIGH (ref 4.8–5.6)

## 2020-08-05 LAB — SPECIMEN STATUS REPORT

## 2020-08-05 LAB — BASIC METABOLIC PANEL
BUN/Creatinine Ratio: 24 (ref 10–24)
BUN: 41 mg/dL — ABNORMAL HIGH (ref 8–27)
CO2: 23 mmol/L (ref 20–29)
Calcium: 9.4 mg/dL (ref 8.6–10.2)
Chloride: 99 mmol/L (ref 96–106)
Creatinine, Ser: 1.7 mg/dL — ABNORMAL HIGH (ref 0.76–1.27)
GFR calc Af Amer: 49 mL/min/{1.73_m2} — ABNORMAL LOW (ref >59)
GFR calc non Af Amer: 42 mL/min/{1.73_m2} — ABNORMAL LOW (ref >59)
Glucose: 147 mg/dL — ABNORMAL HIGH (ref 65–99)
Potassium: 4.8 mmol/L (ref 3.5–5.2)
Sodium: 141 mmol/L (ref 134–144)

## 2020-08-09 ENCOUNTER — Ambulatory Visit: Payer: Medicare HMO | Admitting: Cardiovascular Disease

## 2020-08-10 DIAGNOSIS — I5042 Chronic combined systolic (congestive) and diastolic (congestive) heart failure: Secondary | ICD-10-CM | POA: Diagnosis not present

## 2020-08-10 DIAGNOSIS — E1129 Type 2 diabetes mellitus with other diabetic kidney complication: Secondary | ICD-10-CM | POA: Diagnosis not present

## 2020-08-10 DIAGNOSIS — D631 Anemia in chronic kidney disease: Secondary | ICD-10-CM | POA: Diagnosis not present

## 2020-08-10 DIAGNOSIS — N189 Chronic kidney disease, unspecified: Secondary | ICD-10-CM | POA: Diagnosis not present

## 2020-08-10 DIAGNOSIS — I129 Hypertensive chronic kidney disease with stage 1 through stage 4 chronic kidney disease, or unspecified chronic kidney disease: Secondary | ICD-10-CM | POA: Diagnosis not present

## 2020-08-10 DIAGNOSIS — R809 Proteinuria, unspecified: Secondary | ICD-10-CM | POA: Diagnosis not present

## 2020-08-10 DIAGNOSIS — E1122 Type 2 diabetes mellitus with diabetic chronic kidney disease: Secondary | ICD-10-CM | POA: Diagnosis not present

## 2020-08-15 ENCOUNTER — Other Ambulatory Visit: Payer: Self-pay | Admitting: Family Medicine

## 2020-08-18 ENCOUNTER — Telehealth: Payer: Self-pay

## 2020-08-18 MED ORDER — INSULIN DETEMIR 100 UNIT/ML ~~LOC~~ SOLN: 80.0000 [IU] | Freq: Every day | SUBCUTANEOUS | 0 refills | Status: DC

## 2020-08-18 NOTE — Telephone Encounter (Signed)
May have the [place] believe he uses a flex pen-80 units each evening as directed May titrate up to 100 if necessary may have 4 months refill

## 2020-08-18 NOTE — Telephone Encounter (Signed)
Pt is calling pharmacy is saying medication was being rejected to call dr he needs his insulin detemir (LEVEMIR) 100 UNIT/ML injection [place]  Pt said pharmacy is closing early today and needs his meds   Pt call back [phone]

## 2020-08-18 NOTE — Telephone Encounter (Signed)
Prescription sent electronically to pharmacy. Patient notified. 

## 2020-08-18 NOTE — Telephone Encounter (Addendum)
Listed as historical with no directions- please advise pharmacy states the script is for 80 units at 10pm

## 2020-08-20 DIAGNOSIS — G4733 Obstructive sleep apnea (adult) (pediatric): Secondary | ICD-10-CM | POA: Diagnosis not present

## 2020-08-20 DIAGNOSIS — I5023 Acute on chronic systolic (congestive) heart failure: Secondary | ICD-10-CM | POA: Diagnosis not present

## 2020-08-20 DIAGNOSIS — I5033 Acute on chronic diastolic (congestive) heart failure: Secondary | ICD-10-CM | POA: Diagnosis not present

## 2020-08-20 DIAGNOSIS — I509 Heart failure, unspecified: Secondary | ICD-10-CM | POA: Diagnosis not present

## 2020-09-01 ENCOUNTER — Other Ambulatory Visit: Payer: Self-pay | Admitting: *Deleted

## 2020-09-01 ENCOUNTER — Telehealth: Payer: Self-pay | Admitting: Family Medicine

## 2020-09-01 MED ORDER — COLCHICINE 0.6 MG PO TABS: ORAL_TABLET | ORAL | 2 refills | Status: DC

## 2020-09-01 NOTE — Telephone Encounter (Signed)
Please verify with patient his dosage.  I do not mind's prescribing this but I would like to know the dosage that he is taking-100 mg 1 a day?  2 a day?-Before I authorize it

## 2020-09-01 NOTE — Telephone Encounter (Signed)
Patient notified and rx sent. 

## 2020-09-01 NOTE — Telephone Encounter (Signed)
Patient is requesting the colchicine 0.6mg  - he is taking it when he has a flare up and typically takes 2 pills 1 day and 2 pills the next. Requesting a 90 day supply sent to [facility].

## 2020-09-01 NOTE — Telephone Encounter (Signed)
Patient is requesting his gout medication be sent to [facility] mail order because its cheaper for 90 day supply. Please advise

## 2020-09-01 NOTE — Telephone Encounter (Signed)
Colchicine 0.6 mg may take 2 at the first sign of a gout flareup then 1 twice daily as needed, #45, 2 refills (Nurses I do not send in 180 tablets on this medicine) typically I do 45 tablets that should be more than enough to cover him for multiple flareups of gout obviously if the patient does not think that is enough let me know

## 2020-09-07 ENCOUNTER — Other Ambulatory Visit: Payer: Self-pay | Admitting: Family Medicine

## 2020-09-08 ENCOUNTER — Ambulatory Visit: Payer: Medicare HMO | Admitting: Nurse Practitioner

## 2020-09-08 ENCOUNTER — Telehealth: Payer: Self-pay

## 2020-09-08 NOTE — Telephone Encounter (Signed)
Nurses I am not certain of which medicine he was using at his local pharmacy (it would be very difficult to hunt through all of the old prescriptions-does he have a name of what he wants sent to the local pharmacy?  Or potentially could the local pharmacy send [place] refill request)

## 2020-09-08 NOTE — Telephone Encounter (Signed)
Eugene [name] called said the medication for colchicine 0.6 MG tablet  Was too expensive sent to [facility] he said go back to the old RX and send it to [facility] in [place]   Pt call back [phone]

## 2020-09-08 NOTE — Telephone Encounter (Signed)
Colchicine 0.6mg  #30 Take 2 tablets by mouth at first sign of gout flare up then 1 twice daily PRN  Cash price at [facility] is cheaper than mail order

## 2020-09-09 ENCOUNTER — Other Ambulatory Visit: Payer: Self-pay | Admitting: Family Medicine

## 2020-09-09 ENCOUNTER — Other Ambulatory Visit: Payer: Self-pay | Admitting: *Deleted

## 2020-09-09 MED ORDER — COLCHICINE 0.6 MG PO TABS
ORAL_TABLET | ORAL | 2 refills | Status: DC
Start: 2020-09-09 — End: 2020-11-06

## 2020-09-09 MED ORDER — GLIMEPIRIDE 1 MG PO TABS: ORAL_TABLET | ORAL | 0 refills | Status: DC

## 2020-09-09 NOTE — Telephone Encounter (Signed)
Patient notified

## 2020-09-09 NOTE — Telephone Encounter (Signed)
Please verify with him that this is the correct dose if it is go ahead with 90-day with 1 additional refill thank you

## 2020-09-09 NOTE — Telephone Encounter (Signed)
FYI prescription was sent to [facility] as requested

## 2020-09-12 ENCOUNTER — Telehealth: Payer: Self-pay | Admitting: Internal Medicine

## 2020-09-12 NOTE — Telephone Encounter (Signed)
PA for repatha submitted via CMM ([place]) PA Case: [id number], Status: Approved, Coverage Starts on: 08/27/2020 12:00:00 AM, Coverage Ends on: 08/26/2021 12:00:00 AM

## 2020-09-16 ENCOUNTER — Ambulatory Visit: Payer: Medicare HMO | Admitting: Cardiology

## 2020-09-20 DIAGNOSIS — I5023 Acute on chronic systolic (congestive) heart failure: Secondary | ICD-10-CM | POA: Diagnosis not present

## 2020-09-20 DIAGNOSIS — I509 Heart failure, unspecified: Secondary | ICD-10-CM | POA: Diagnosis not present

## 2020-09-20 DIAGNOSIS — G4733 Obstructive sleep apnea (adult) (pediatric): Secondary | ICD-10-CM | POA: Diagnosis not present

## 2020-09-20 DIAGNOSIS — I5033 Acute on chronic diastolic (congestive) heart failure: Secondary | ICD-10-CM | POA: Diagnosis not present

## 2020-09-27 ENCOUNTER — Other Ambulatory Visit: Payer: Self-pay | Admitting: Family Medicine

## 2020-09-29 DIAGNOSIS — E1122 Type 2 diabetes mellitus with diabetic chronic kidney disease: Secondary | ICD-10-CM | POA: Diagnosis not present

## 2020-09-29 DIAGNOSIS — N189 Chronic kidney disease, unspecified: Secondary | ICD-10-CM | POA: Diagnosis not present

## 2020-09-29 DIAGNOSIS — I129 Hypertensive chronic kidney disease with stage 1 through stage 4 chronic kidney disease, or unspecified chronic kidney disease: Secondary | ICD-10-CM | POA: Diagnosis not present

## 2020-09-29 DIAGNOSIS — D631 Anemia in chronic kidney disease: Secondary | ICD-10-CM | POA: Diagnosis not present

## 2020-09-29 DIAGNOSIS — I5042 Chronic combined systolic (congestive) and diastolic (congestive) heart failure: Secondary | ICD-10-CM | POA: Diagnosis not present

## 2020-09-29 DIAGNOSIS — E1129 Type 2 diabetes mellitus with other diabetic kidney complication: Secondary | ICD-10-CM | POA: Diagnosis not present

## 2020-09-29 DIAGNOSIS — R809 Proteinuria, unspecified: Secondary | ICD-10-CM | POA: Diagnosis not present

## 2020-10-03 NOTE — Progress Notes (Deleted)
Cardiology Office Note  Date: 10/03/2020   ID: Eugene [name], DOB 08/24/1956, MRN [id number]  PCP:  [name], MD  Cardiologist:  [name], MD Electrophysiologist:  None   Chief Complaint:   History of Present Illness: Eugene [name] is a 65 y.o. male with a history of NSTEMI, CAD status post PCI, chronic diastolic heart failure, OSA on CPAP, HTN, HLD, stage IIIb CKD.  Previous PCI 2012 and 2014, recurrent pericardial effusion status post pericardial window 2014 in February 2020 with residual moderate pericardial effusion without tamponade.  Out of hospital NSTEMI 03/14/2020 cardiac catheterization showed occluded mid LAD, patent RCA, WMA on echo consistent with cath anatomy.  Plan was for medical treatment.  Plavix was added for 1 year to aspirin.  Lopressor changed to Toprol.  Diuretics were increased.  Repatha started  Last encounter with Kerin Ransom, PA 03/28/2020 for follow-up.  He had been doing well since discharge.  O2 sat had been on stable around 92% and using oxygen mainly at night.  Denied any chest pain.  Diastolic heart failure was stable in the office.  PCP had arranged for upgrade to CPAP.  Blood pressure was controlled.  He was continuing on Repatha for hyperlipidemia.  No recent anginal symptoms.  Presented to [facility] emergency department on 05/30/2020 with complaints of 3-day history of right chest and scapular pain similar to previous cardiac complaints.  Stated it was worse with palpation and moderate in intensity.  He tried nitroglycerin and aspirin the previous day without any relief.  Initial troponin 59> 66.  BUN 44, creatinine 1.63, GFR 44, glucose 218, hemoglobin 11, hematocrit 33.  Recent hemoglobin A1c at PCP office visit 7.2 with glucose of 147, BUN of 41, creatinine 1.70, GFR 42.  Patient sees nephrology.  Past Medical History:  Diagnosis Date  . Anemia   . Anxiety   . Anxiety   . Arteriosclerotic cardiovascular disease (ASCVD)    a.  05/2011 s/p DES to PDA and RCA. b. 08/2012 Inflat  STEMI/Cath/PCI: LM minor irregs, LAD 50p, D1 50, LCX nl, OM1 25, RCA 30-40p, 100d (treated with 2.75x30mm Promus Premier DES);    [name] Asthma   . Bell palsy   . C. difficile colitis    a. 08/2012  . Cholelithiasis 07/2012   Asymptomatic; identified incidentally  . Chronic diastolic CHF (congestive heart failure) ([place])   . CKD (chronic kidney disease), stage III ([place])   . Contrast dye induced nephropathy    a. 08/2012 post cath/pci  . COPD (chronic obstructive pulmonary disease) ([place])   . Diabetes mellitus    Peripheral neuropathy  . Gallstones   . GERD (gastroesophageal reflux disease)   . Gout   . Hyperlipidemia   . Hypertension   . Myocardial infarct ([place]) 09/08/12  . Nephrolithiasis   . Old myocardial infarction   . Pericardial effusion    a. s/p window in 2014. b. s/p redo window in 09/2018.  [name] Peripheral neuropathy   . PONV (postoperative nausea and vomiting)   . Sleep apnea   . Statin intolerance     Past Surgical History:  Procedure Laterality Date  . CARDIAC CATHETERIZATION    . CHEST TUBE INSERTION    . CIRCUMCISION    . COLONOSCOPY     In [facility], approximately 2011 per patient, was normal. Advised to come back in 10 years.  . ESOPHAGOGASTRODUODENOSCOPY     in [place] over 20 yrs ago  . ESOPHAGOGASTRODUODENOSCOPY  06/12/2012   IRC:VELFYB esophagus-status post Venia Minks dilation. Abnormal gastric mucosa of uncertain significance-status post biopsy  . EYE SURGERY    . INTRAOPERATIVE TRANSESOPHAGEAL ECHOCARDIOGRAM N/A 06/23/2013   Procedure: INTRAOPERATIVE TRANSESOPHAGEAL ECHOCARDIOGRAM;  Surgeon: Grace Isaac, MD;  Location: [facility];  Service: Open Heart Surgery;  Laterality: N/A;  . LEFT HEART CATH AND CORONARY ANGIOGRAPHY N/A 03/14/2020   Procedure: LEFT HEART CATH AND CORONARY ANGIOGRAPHY;  Surgeon: Lorretta Harp, MD;  Location: [facility];  Service: Cardiovascular;  Laterality: N/A;  .  LEFT HEART CATHETERIZATION WITH CORONARY ANGIOGRAM N/A 09/08/2012   Procedure: LEFT HEART CATHETERIZATION WITH CORONARY ANGIOGRAM;  Surgeon: [name], MD;  Location: [facility];  Service: Cardiovascular;  Laterality: N/A;  . stents    . SUBXYPHOID PERICARDIAL WINDOW N/A 06/23/2013   Procedure: SUBXYPHOID PERICARDIAL WINDOW;  Surgeon: Grace Isaac, MD;  Location: [facility];  Service: Thoracic;  Laterality: N/A;  . SUBXYPHOID PERICARDIAL WINDOW  10/01/2018  . SUBXYPHOID PERICARDIAL WINDOW N/A 10/01/2018   Procedure: REDO SUBXYPHOID PERICARDIAL WINDOW;  Surgeon: Grace Isaac, MD;  Location: [facility];  Service: Thoracic;  Laterality: N/A;  . TEE WITHOUT CARDIOVERSION N/A 10/01/2018   Procedure: TRANSESOPHAGEAL ECHOCARDIOGRAM (TEE);  Surgeon: Grace Isaac, MD;  Location: [facility] OR;  Service: Thoracic;  Laterality: N/A;    Current Outpatient Medications  Medication Sig Dispense Refill  . acetaminophen (TYLENOL) 500 MG tablet Take 1,500 mg by mouth every 6 (six) hours as needed for mild pain, fever or headache.     . allopurinol (ZYLOPRIM) 100 MG tablet Take by mouth.    . ALPRAZolam (XANAX) 0.5 MG tablet Take 1-2 tablets (0.5-1 mg total) by mouth at bedtime as needed for sleep. 30 tablet 5  . aspirin 81 MG EC tablet Take by mouth.    . clopidogrel (PLAVIX) 75 MG tablet Take 1 tablet (75 mg total) by mouth daily. 90 tablet 3  . colchicine 0.6 MG tablet Take 2 tablets by mouth at first sign of gout flare up then 1 twice daily PRN 45 tablet 2  . DROPLET PEN NEEDLES 32G X 4 MM MISC USE EVERY DAY AS DIRECTED 100 each 0  . Evolocumab (REPATHA SURECLICK) 017 MG/ML SOAJ Inject 1 Dose into the skin every 14 (fourteen) days. (Patient not taking: Reported on 08/02/2020) 2 pen 11  . glimepiride (AMARYL) 1 MG tablet TAKE 1 TABLET WITH MEALS AS DIRECTED. MAX 3 TABLETS PER DAY 270 tablet 0  . isosorbide mononitrate (IMDUR) 60 MG 24 hr tablet Take 1 tablet (60 mg total) by mouth daily. 90 tablet 3  .  LEVEMIR FLEXTOUCH 100 UNIT/ML FlexPen INJECT 0.8 MLS (80 UNITS TOTAL) INTO THE SKIN DAILY. 30 mL 0  . losartan (COZAAR) 25 MG tablet Take by mouth.    . metoprolol succinate (TOPROL-XL) 50 MG 24 hr tablet Take 1 tablet (50 mg total) by mouth daily. Take with or immediately following a meal. 90 tablet 3  . nitroGLYCERIN (NITRODUR - DOSED IN MG/24 HR) 0.4 mg/hr patch Place onto the skin.    [name] ofloxacin (FLOXIN) 0.3 % OTIC solution 5 drops as needed.    . torsemide (DEMADEX) 20 MG tablet TAKE 2 TABLETS EVERY DAY 90 tablet 5  . traMADol (ULTRAM) 50 MG tablet TAKE 1 TABLET (50 MG TOTAL) BY MOUTH EVERY 6 (SIX) HOURS AS NEEDED 24 tablet 5   No current facility-administered medications for this visit.   Allergies:  Hydrocodone, Lisinopril, Neurontin [gabapentin], Statins, Metformin and related,  and Norvasc [amlodipine besylate]   Social History: The patient  reports that he has never smoked. He has never used smokeless tobacco. He reports that he does not drink alcohol and does not use drugs.   Family History: The patient's family history includes Diabetes in his brother, brother, mother, and sister; Heart attack in his mother; Hypertension in his brother and brother; Sleep apnea in his sister; Stroke in his mother.   ROS:  Please see the history of present illness. Otherwise, complete review of systems is positive for none.  All other systems are reviewed and negative.   Physical Exam: VS:  There were no vitals taken for this visit., BMI There is no height or weight on file to calculate BMI.  Wt Readings from Last 3 Encounters:  08/02/20 260 lb (117.9 kg)  05/30/20 256 lb (116.1 kg)  03/28/20 253 lb 6.4 oz (114.9 kg)    General: Patient appears comfortable at rest. HEENT: Conjunctiva and lids normal, oropharynx clear with moist mucosa. Neck: Supple, no elevated JVP or carotid bruits, no thyromegaly. Lungs: Clear to auscultation, nonlabored breathing at rest. Cardiac: Regular rate and rhythm,  no S3 or significant systolic murmur, no pericardial rub. Abdomen: Soft, nontender, no hepatomegaly, bowel sounds present, no guarding or rebound. Extremities: No pitting edema, distal pulses 2+. Skin: Warm and dry. Musculoskeletal: No kyphosis. Neuropsychiatric: Alert and oriented x3, affect grossly appropriate.  ECG:  {EKG/Telemetry Strips Reviewed:[phone]}  Recent Labwork: 03/13/2020: B Natriuretic Peptide 322.4 05/30/2020: Hemoglobin 11.0; Platelets 191 08/02/2020: ALT 9; AST 15; BUN 41; Creatinine, Ser 1.70; Potassium 4.8; Sodium 141     Component Value Date/Time   CHOL 124 04/15/2020 0943   TRIG 289 (H) 04/15/2020 0943   HDL 33 (L) 04/15/2020 0943   CHOLHDL 3.8 04/15/2020 0943   CHOLHDL 9.8 11/10/2015 0521   VLDL UNABLE TO CALCULATE IF TRIGLYCERIDE OVER 400 mg/dL 11/10/2015 0521   LDLCALC 47 04/15/2020 0943    Other Studies Reviewed Today:  Echocardiogram 03/14/2020  1. Left ventricular ejection fraction, by estimation, is 40 to 45%. The left ventricle has mildly decreased function. The left ventricle demonstrates regional wall motion abnormalities (see scoring diagram/findings for description). There is moderate concentric left ventricular hypertrophy. Left ventricular diastolic parameters are consistent with Grade III diastolic dysfunction (restrictive). 2. Right ventricular systolic function is normal. The right ventricular size is normal. There is mildly elevated pulmonary artery systolic pressure. 3. Left atrial size was moderately dilated. 4. Right atrial size was mildly dilated. 5. Largest dimension 3.4 cm posterior to RA without evidence of tamponade.. Large pericardial effusion. The pericardial effusion is circumferential. There is no evidence of cardiac tamponade. 6. The mitral valve is normal in structure. Trivial mitral valve regurgitation. No evidence of mitral stenosis. 7. The aortic valve is tricuspid. Aortic valve regurgitation is not visualized. No  aortic stenosis is present. 8. The inferior vena cava is dilated in size with <50% respiratory variability, suggesting right atrial pressure of 15 mmHg. Comparison(s): Changes from prior study are noted.  03/14/2020 LEFT HEART CATH AND CORONARY ANGIOGRAPHY   Conclusion    Previously placed Mid RCA to Dist RCA stent (unknown type) is widely patent.  Prox Cx lesion is 50% stenosed.  Mid RCA lesion is 40% stenosed.  Mid LAD-1 lesion is 50% stenosed.  Mid LAD-2 lesion is 100% stenosed with 100% stenosed side branch in 2nd Sept.  Mid LAD-3 lesion is 100% stenosed.   Diagnostic Dominance: Right       Assessment and Plan:  No diagnosis found.   Medication Adjustments/Labs and Tests Ordered: Current medicines are reviewed at length with the patient today.  Concerns regarding medicines are outlined above.   Disposition: Follow-up with ***  Signed, Levell July, NP 10/03/2020 10:54 AM    [place] [facility] [address], [place], [place] [address] Phone: [phone]; Fax: [phone]

## 2020-10-04 ENCOUNTER — Ambulatory Visit: Payer: Medicare HMO | Admitting: Family Medicine

## 2020-10-04 DIAGNOSIS — I5032 Chronic diastolic (congestive) heart failure: Secondary | ICD-10-CM

## 2020-10-04 DIAGNOSIS — I10 Essential (primary) hypertension: Secondary | ICD-10-CM

## 2020-10-04 DIAGNOSIS — E782 Mixed hyperlipidemia: Secondary | ICD-10-CM

## 2020-10-04 DIAGNOSIS — I2510 Atherosclerotic heart disease of native coronary artery without angina pectoris: Secondary | ICD-10-CM

## 2020-10-05 ENCOUNTER — Telehealth: Payer: Self-pay

## 2020-10-05 NOTE — Telephone Encounter (Signed)
Not safe to take Too much risk bleeding Stick with tylenol and or tramadol Follow up prn

## 2020-10-05 NOTE — Telephone Encounter (Signed)
Pt is on blood thinner and he took his wife naproxen and wanted to know if he can take this he is in so much pain also if he can get some.  Pt call back [phone]

## 2020-10-05 NOTE — Telephone Encounter (Signed)
Patient advised per Dr Nicki Reaper: Not safe to take Too much risk bleeding Stick with tylenol and or tramadol Follow up prn Patient verbalized understanding.

## 2020-10-10 ENCOUNTER — Other Ambulatory Visit: Payer: Self-pay

## 2020-10-10 ENCOUNTER — Encounter (INDEPENDENT_AMBULATORY_CARE_PROVIDER_SITE_OTHER): Payer: Medicare HMO | Admitting: Ophthalmology

## 2020-10-10 DIAGNOSIS — E113512 Type 2 diabetes mellitus with proliferative diabetic retinopathy with macular edema, left eye: Secondary | ICD-10-CM | POA: Diagnosis not present

## 2020-10-10 DIAGNOSIS — H35033 Hypertensive retinopathy, bilateral: Secondary | ICD-10-CM | POA: Diagnosis not present

## 2020-10-10 DIAGNOSIS — H43813 Vitreous degeneration, bilateral: Secondary | ICD-10-CM

## 2020-10-10 DIAGNOSIS — E113311 Type 2 diabetes mellitus with moderate nonproliferative diabetic retinopathy with macular edema, right eye: Secondary | ICD-10-CM

## 2020-10-10 DIAGNOSIS — I10 Essential (primary) hypertension: Secondary | ICD-10-CM

## 2020-10-10 DIAGNOSIS — D3130 Benign neoplasm of unspecified choroid: Secondary | ICD-10-CM

## 2020-10-10 LAB — HM DIABETES EYE EXAM

## 2020-10-13 ENCOUNTER — Other Ambulatory Visit: Payer: Self-pay

## 2020-10-13 ENCOUNTER — Encounter: Payer: Self-pay | Admitting: Family Medicine

## 2020-10-13 ENCOUNTER — Ambulatory Visit (INDEPENDENT_AMBULATORY_CARE_PROVIDER_SITE_OTHER): Payer: Medicare HMO | Admitting: Family Medicine

## 2020-10-13 VITALS — BP 128/80 | Temp 97.8°F | Ht 73.0 in | Wt 251.0 lb

## 2020-10-13 DIAGNOSIS — K219 Gastro-esophageal reflux disease without esophagitis: Secondary | ICD-10-CM

## 2020-10-13 MED ORDER — SUCRALFATE 1 G PO TABS: ORAL_TABLET | ORAL | 0 refills | Status: DC

## 2020-10-13 MED ORDER — PANTOPRAZOLE SODIUM 40 MG PO TBEC: 40.0000 mg | DELAYED_RELEASE_TABLET | Freq: Every day | ORAL | 3 refills | Status: DC

## 2020-10-13 NOTE — Progress Notes (Signed)
   Subjective:    Patient ID: Eugene [name], male    DOB: Dec 29, 1955, 65 y.o.   MRN: [id number]  HPI  Patient arrives to discuss catch in his throat- eases up when coughs- choking some when eating and causes him to cough-has history of acid reflux. Significant sore throat feels like there is something in the back of her throat has to cough a lot he states he got this way years ago when he had reflux issues he denies dysphagia.  Denies sweats or chills.  States his energy level overall pretty good.  He does get out of breath when he forces himself to do too much but he does have underlying heart disease he does feel his diabetes doing okay he tries to be relatively healthy with his eating and we did review over which foods tend to make reflux worse Review of Systems Please see above    Objective:   Physical Exam Lungs are clear respiratory rate normal heart is regular pulses normal extremities no edema throat nonerythematous neck no masses  Patient multiple different times has a mild dry cough with clearing of his throat     Assessment & Plan:  1. Gastroesophageal reflux disease without esophagitis We did discuss dietary measures printed info given Also started Protonix on a daily basis. Put bed on 6 inch blocks at the head of the bed Avoid eating close to bedtime Carafate for the next 2 weeks Referral to ENT to evaluate the larynx to make sure there is not another condition going on  - Ambulatory referral to ENT  2. Laryngopharyngeal reflux Referral to ENT to evaluate the larynx to make sure there is not any worrisome condition going on regarding that patient will keep his regular follow-up visits - Ambulatory referral to ENT

## 2020-10-13 NOTE — Patient Instructions (Signed)
Food Choices for Gastroesophageal Reflux Disease, Adult When you have gastroesophageal reflux disease (GERD), the foods you eat and your eating habits are very important. Choosing the right foods can help ease your discomfort. Think about working with a food expert (dietitian) to help you make good choices. What are tips for following this plan? Reading food labels  Look for foods that are low in saturated fat. Foods that may help with your symptoms include: ? Foods that have less than 5% of daily value (DV) of fat. ? Foods that have 0 grams of trans fat. Cooking  Do not fry your food.  Cook your food by baking, steaming, grilling, or broiling. These are all methods that do not need a lot of fat for cooking.  To add flavor, try to use herbs that are low in spice and acidity. Meal planning  Choose healthy foods that are low in fat, such as: ? Fruits and vegetables. ? Whole grains. ? Low-fat dairy products. ? Lean meats, fish, and poultry.  Eat small meals often instead of eating 3 large meals each day. Eat your meals slowly in a place where you are relaxed. Avoid bending over or lying down until 2-3 hours after eating.  Limit high-fat foods such as fatty meats or fried foods.  Limit your intake of fatty foods, such as oils, butter, and shortening.  Avoid the following as told by your doctor: ? Foods that cause symptoms. These may be different for different people. Keep a food diary to keep track of foods that cause symptoms. ? Alcohol. ? Drinking a lot of liquid with meals. ? Eating meals during the 2-3 hours before bed.   Lifestyle  Stay at a healthy weight. Ask your doctor what weight is healthy for you. If you need to lose weight, work with your doctor to do so safely.  Exercise for at least 30 minutes on 5 or more days each week, or as told by your doctor.  Wear loose-fitting clothes.  Do not smoke or use any products that contain nicotine or tobacco. If you need help  quitting, ask your doctor.  Sleep with the head of your bed higher than your feet. Use a wedge under the mattress or blocks under the bed frame to raise the head of the bed.  Chew sugar-free gum after meals. What foods should eat? Eat a healthy, well-balanced diet of fruits, vegetables, whole grains, low-fat dairy products, lean meats, fish, and poultry. Each person is different. Foods that may cause symptoms in one person may not cause any symptoms in another person. Work with your doctor to find foods that are safe for you. The items listed above may not be a complete list of what you can eat and drink. Contact a food expert for more options.   What foods should I avoid? Limiting some of these foods may help in managing the symptoms of GERD. Everyone is different. Talk with a food expert or your doctor to help you find the exact foods to avoid, if any. Fruits Any fruits prepared with added fat. Any fruits that cause symptoms. For some people, this may include citrus fruits, such as oranges, grapefruit, pineapple, and lemons. Vegetables Deep-fried vegetables. French fries. Any vegetables prepared with added fat. Any vegetables that cause symptoms. For some people, this may include tomatoes and tomato products, chili peppers, onions and garlic, and horseradish. Grains Pastries or quick breads with added fat. Meats and other proteins High-fat meats, such as fatty beef or pork,   hot dogs, ribs, ham, sausage, salami, and bacon. Fried meat or protein, including fried fish and fried chicken. Nuts and nut butters, in large amounts. Dairy Whole milk and chocolate milk. Sour cream. Cream. Ice cream. Cream cheese. Milkshakes. Fats and oils Butter. Margarine. Shortening. Ghee. Beverages Coffee and tea, with or without caffeine. Carbonated beverages. Sodas. Energy drinks. Fruit juice made with acidic fruits, such as orange or grapefruit. Tomato juice. Alcoholic drinks. Sweets and desserts Chocolate and  cocoa. Donuts. Seasonings and condiments Pepper. Peppermint and spearmint. Added salt. Any condiments, herbs, or seasonings that cause symptoms. For some people, this may include curry, hot sauce, or vinegar-based salad dressings. The items listed above may not be a complete list of what you should not eat and drink. Contact a food expert for more options. Questions to ask your doctor Diet and lifestyle changes are often the first steps that are taken to manage symptoms of GERD. If diet and lifestyle changes do not help, talk with your doctor about taking medicines. Where to find more information  International Foundation for Gastrointestinal Disorders: aboutgerd.org Summary  When you have GERD, food and lifestyle choices are very important in easing your symptoms.  Eat small meals often instead of 3 large meals a day. Eat your meals slowly and in a place where you are relaxed.  Avoid bending over or lying down until 2-3 hours after eating.  Limit high-fat foods such as fatty meats or fried foods. This information is not intended to replace advice given to you by your health care provider. Make sure you discuss any questions you have with your health care provider. Document Revised: 02/22/2020 Document Reviewed: 02/22/2020 Elsevier Patient Education  2021 Elsevier Inc.  

## 2020-10-14 DIAGNOSIS — D631 Anemia in chronic kidney disease: Secondary | ICD-10-CM | POA: Diagnosis not present

## 2020-10-14 DIAGNOSIS — E559 Vitamin D deficiency, unspecified: Secondary | ICD-10-CM | POA: Diagnosis not present

## 2020-10-14 DIAGNOSIS — N189 Chronic kidney disease, unspecified: Secondary | ICD-10-CM | POA: Diagnosis not present

## 2020-10-14 DIAGNOSIS — E1122 Type 2 diabetes mellitus with diabetic chronic kidney disease: Secondary | ICD-10-CM | POA: Diagnosis not present

## 2020-10-14 DIAGNOSIS — R809 Proteinuria, unspecified: Secondary | ICD-10-CM | POA: Diagnosis not present

## 2020-10-14 DIAGNOSIS — E1129 Type 2 diabetes mellitus with other diabetic kidney complication: Secondary | ICD-10-CM | POA: Diagnosis not present

## 2020-10-14 DIAGNOSIS — I129 Hypertensive chronic kidney disease with stage 1 through stage 4 chronic kidney disease, or unspecified chronic kidney disease: Secondary | ICD-10-CM | POA: Diagnosis not present

## 2020-10-14 DIAGNOSIS — R768 Other specified abnormal immunological findings in serum: Secondary | ICD-10-CM | POA: Diagnosis not present

## 2020-10-14 DIAGNOSIS — I5042 Chronic combined systolic (congestive) and diastolic (congestive) heart failure: Secondary | ICD-10-CM | POA: Diagnosis not present

## 2020-10-16 NOTE — Progress Notes (Signed)
Cardiology Office Note  Date: 10/17/2020   ID: Eugene [name], DOB 1956-07-05, MRN [id number]  PCP:  [name], MD  Cardiologist:  [name], MD Electrophysiologist:  None   Chief Complaint:   History of Present Illness: Eugene [name] is a 65 y.o. male with a history of NSTEMI, CAD status post PCI, chronic diastolic heart failure, OSA on CPAP, HTN, HLD, stage IIIb CKD.  History of large pericardial effusions with pericardial windows in the past x2.  Previous PCI 2012 and 2014, recurrent pericardial effusion status post pericardial window 2014 in February 2020 with residual moderate pericardial effusion without tamponade.  Out of hospital NSTEMI 03/14/2020 cardiac catheterization showed occluded mid LAD, patent RCA, WMA on echo consistent with cath anatomy.  Plan was for medical treatment.  Plavix was added for 1 year to aspirin.  Lopressor changed to Toprol.  Diuretics were increased.  Repatha started.   Last encounter with Kerin Ransom, PA 03/28/2020 for follow-up.  He had been doing well since discharge.  O2 sat had been on stable around 92% and using oxygen mainly at night.  Denied any chest pain.  Diastolic heart failure was stable in the office.  PCP had arranged for upgrade to CPAP.  Blood pressure was controlled.  He was continuing on Repatha for hyperlipidemia.  No recent anginal symptoms.  Presented to [facility] emergency department on 05/30/2020 with complaints of 3-day history of right chest and scapular pain similar to previous cardiac complaints.  Stated it was worse with palpation and moderate in intensity.  He tried nitroglycerin and aspirin the previous day without any relief.  Initial troponin 59> 66.  BUN 44, creatinine 1.63, GFR 44, glucose 218, hemoglobin 11, hematocrit 33.  Recent hemoglobin A1c at PCP office visit 7.2 with glucose of 147, BUN of 41, creatinine 1.70, GFR 42. Patient sees nephrology.  He is here for follow-up today.  He states he used  to see Dr. Bronson Ing in the past.  States he has been doing well.  States he is watching his weight and weighs daily.  Today's weight was 253 on his home scales.  On our scales today his weight was 260.  Blood pressure was elevated but he is having some significant pain in his left knee and plans to see an orthopedic physician tomorrow.  He  thinks he may have gout in his left knee.  He states normally his blood pressure is not as high as it is today 152/82 and is likely due to left knee pain.  He denies any shortness of breath, dyspnea, anginal symptoms.  States she was shortly off Repatha due to lack of funding but is currently back on Repatha.  Denies any orthostatic symptoms, palpitations or arrhythmias, CVA or TIA-like symptoms, PND, orthopnea, bleeding, claudication-like symptoms, DVT or PE-like symptoms.  Has some mild chronic lower extremity edema.   Past Medical History:  Diagnosis Date  . Anemia   . Anxiety   . Anxiety   . Arteriosclerotic cardiovascular disease (ASCVD)    a. 05/2011 s/p DES to PDA and RCA. b. 08/2012 Inflat  STEMI/Cath/PCI: LM minor irregs, LAD 50p, D1 50, LCX nl, OM1 25, RCA 30-40p, 100d (treated with 2.75x52mm Promus Premier DES);    [name] Asthma   . Bell palsy   . C. difficile colitis    a. 08/2012  . Cholelithiasis 07/2012   Asymptomatic; identified incidentally  . Chronic diastolic CHF (congestive heart failure) ([place])   . CKD (chronic kidney  disease), stage III ([place])   . Contrast dye induced nephropathy    a. 08/2012 post cath/pci  . COPD (chronic obstructive pulmonary disease) ([place])   . Diabetes mellitus    Peripheral neuropathy  . Gallstones   . GERD (gastroesophageal reflux disease)   . Gout   . Hyperlipidemia   . Hypertension   . Myocardial infarct ([place]) 09/08/12  . Nephrolithiasis   . Old myocardial infarction   . Pericardial effusion    a. s/p window in 2014. b. s/p redo window in 09/2018.  [name] Peripheral neuropathy   . PONV (postoperative nausea and  vomiting)   . Sleep apnea   . Statin intolerance     Past Surgical History:  Procedure Laterality Date  . CARDIAC CATHETERIZATION    . CHEST TUBE INSERTION    . CIRCUMCISION    . COLONOSCOPY     In [facility], approximately 2011 per patient, was normal. Advised to come back in 10 years.  . ESOPHAGOGASTRODUODENOSCOPY     in [place] over 20 yrs ago  . ESOPHAGOGASTRODUODENOSCOPY  06/12/2012   VZS:MOLMBE esophagus-status post Venia Minks dilation. Abnormal gastric mucosa of uncertain significance-status post biopsy  . EYE SURGERY    . INTRAOPERATIVE TRANSESOPHAGEAL ECHOCARDIOGRAM N/A 06/23/2013   Procedure: INTRAOPERATIVE TRANSESOPHAGEAL ECHOCARDIOGRAM;  Surgeon: Grace Isaac, MD;  Location: [facility];  Service: Open Heart Surgery;  Laterality: N/A;  . LEFT HEART CATH AND CORONARY ANGIOGRAPHY N/A 03/14/2020   Procedure: LEFT HEART CATH AND CORONARY ANGIOGRAPHY;  Surgeon: Lorretta Harp, MD;  Location: [facility] CV LAB;  Service: Cardiovascular;  Laterality: N/A;  . LEFT HEART CATHETERIZATION WITH CORONARY ANGIOGRAM N/A 09/08/2012   Procedure: LEFT HEART CATHETERIZATION WITH CORONARY ANGIOGRAM;  Surgeon: [name], MD;  Location: [facility] CATH LAB;  Service: Cardiovascular;  Laterality: N/A;  . stents    . SUBXYPHOID PERICARDIAL WINDOW N/A 06/23/2013   Procedure: SUBXYPHOID PERICARDIAL WINDOW;  Surgeon: Grace Isaac, MD;  Location: [facility];  Service: Thoracic;  Laterality: N/A;  . SUBXYPHOID PERICARDIAL WINDOW  10/01/2018  . SUBXYPHOID PERICARDIAL WINDOW N/A 10/01/2018   Procedure: REDO SUBXYPHOID PERICARDIAL WINDOW;  Surgeon: Grace Isaac, MD;  Location: [facility];  Service: Thoracic;  Laterality: N/A;  . TEE WITHOUT CARDIOVERSION N/A 10/01/2018   Procedure: TRANSESOPHAGEAL ECHOCARDIOGRAM (TEE);  Surgeon: Grace Isaac, MD;  Location: [facility] OR;  Service: Thoracic;  Laterality: N/A;    Current Outpatient Medications  Medication Sig Dispense Refill  . acetaminophen  (TYLENOL) 500 MG tablet Take 1,500 mg by mouth every 6 (six) hours as needed for mild pain, fever or headache.     . allopurinol (ZYLOPRIM) 100 MG tablet Take 100 mg by mouth as needed.    . ALPRAZolam (XANAX) 0.5 MG tablet Take 1-2 tablets (0.5-1 mg total) by mouth at bedtime as needed for sleep. 30 tablet 5  . aspirin 81 MG EC tablet Take 81 mg by mouth daily.    . Cholecalciferol 25 MCG (1000 UT) capsule Take 1 capsule by mouth daily.    . clopidogrel (PLAVIX) 75 MG tablet Take 1 tablet (75 mg total) by mouth daily. 90 tablet 3  . colchicine 0.6 MG tablet Take 2 tablets by mouth at first sign of gout flare up then 1 twice daily PRN 45 tablet 2  . DROPLET PEN NEEDLES 32G X 4 MM MISC USE EVERY DAY AS DIRECTED 100 each 0  . Evolocumab (REPATHA SURECLICK) 675 MG/ML SOAJ Inject 1 Dose into the skin every 14 (  fourteen) days. 2 pen 11  . glimepiride (AMARYL) 1 MG tablet TAKE 1 TABLET WITH MEALS AS DIRECTED. MAX 3 TABLETS PER DAY 270 tablet 0  . isosorbide mononitrate (IMDUR) 60 MG 24 hr tablet Take 1 tablet (60 mg total) by mouth daily. 90 tablet 3  . LEVEMIR FLEXTOUCH 100 UNIT/ML FlexPen INJECT 0.8 MLS (80 UNITS TOTAL) INTO THE SKIN DAILY. 30 mL 0  . losartan (COZAAR) 25 MG tablet Take 25 mg by mouth daily.    . metoprolol succinate (TOPROL-XL) 50 MG 24 hr tablet Take 1 tablet (50 mg total) by mouth daily. Take with or immediately following a meal. 90 tablet 3  . nitroGLYCERIN (NITROSTAT) 0.4 MG SL tablet Place 0.4 mg under the tongue every 5 (five) minutes as needed for chest pain.    [name] ofloxacin (FLOXIN) 0.3 % OTIC solution 5 drops as needed.    . pantoprazole (PROTONIX) 40 MG tablet Take 1 tablet (40 mg total) by mouth daily. 30 tablet 3  . sucralfate (CARAFATE) 1 g tablet One 3 times a day for 2 weeks 42 tablet 0  . torsemide (DEMADEX) 20 MG tablet TAKE 2 TABLETS EVERY DAY 90 tablet 5  . traMADol (ULTRAM) 50 MG tablet TAKE 1 TABLET (50 MG TOTAL) BY MOUTH EVERY 6 (SIX) HOURS AS NEEDED 24 tablet 5    No current facility-administered medications for this visit.   Allergies:  Hydrocodone, Lisinopril, Neurontin [gabapentin], Statins, Metformin and related, and Norvasc [amlodipine besylate]   Social History: The patient  reports that he has never smoked. He has never used smokeless tobacco. He reports that he does not drink alcohol and does not use drugs.   Family History: The patient's family history includes Diabetes in his brother, brother, mother, and sister; Heart attack in his mother; Hypertension in his brother and brother; Sleep apnea in his sister; Stroke in his mother.   ROS:  Please see the history of present illness. Otherwise, complete review of systems is positive for none.  All other systems are reviewed and negative.   Physical Exam: VS:  BP (!) 152/82   Pulse 72   Ht 6\' 1"  (1.854 m)   Wt 260 lb 12.8 oz (118.3 kg)   SpO2 96%   BMI 34.41 kg/m , BMI Body mass index is 34.41 kg/m.  Wt Readings from Last 3 Encounters:  10/17/20 260 lb 12.8 oz (118.3 kg)  10/13/20 251 lb (113.9 kg)  08/02/20 260 lb (117.9 kg)    General: Patient appears comfortable at rest. Neck: Supple, no elevated JVP or carotid bruits, no thyromegaly. Lungs: Clear to auscultation, nonlabored breathing at rest. Cardiac: Irregularly irregular rate and rhythm, no S3 or significant systolic murmur, no pericardial rub. Extremities: No pitting edema, distal pulses 2+. Skin: Warm and dry. Musculoskeletal: No kyphosis. Neuropsychiatric: Alert and oriented x3, affect grossly appropriate.  ECG:  EKG 05/30/2020 sinus rhythm with first-degree AV block, right superior axis deviation, incomplete right bundle branch block rate of 82.  Recent Labwork: 03/13/2020: B Natriuretic Peptide 322.4 05/30/2020: Hemoglobin 11.0; Platelets 191 08/02/2020: ALT 9; AST 15; BUN 41; Creatinine, Ser 1.70; Potassium 4.8; Sodium 141     Component Value Date/Time   CHOL 124 04/15/2020 0943   TRIG 289 (H) 04/15/2020 0943   HDL  33 (L) 04/15/2020 0943   CHOLHDL 3.8 04/15/2020 0943   CHOLHDL 9.8 11/10/2015 0521   VLDL UNABLE TO CALCULATE IF TRIGLYCERIDE OVER 400 mg/dL 11/10/2015 0521   LDLCALC 47 04/15/2020 0943  Other Studies Reviewed Today:  Echocardiogram 03/14/2020  1. Left ventricular ejection fraction, by estimation, is 40 to 45%. The left ventricle has mildly decreased function. The left ventricle demonstrates regional wall motion abnormalities (see scoring diagram/findings for description). There is moderate concentric left ventricular hypertrophy. Left ventricular diastolic parameters are consistent with Grade III diastolic dysfunction (restrictive). 2. Right ventricular systolic function is normal. The right ventricular size is normal. There is mildly elevated pulmonary artery systolic pressure. 3. Left atrial size was moderately dilated. 4. Right atrial size was mildly dilated. 5. Largest dimension 3.4 cm posterior to RA without evidence of tamponade.. Large pericardial effusion. The pericardial effusion is circumferential. There is no evidence of cardiac tamponade. 6. The mitral valve is normal in structure. Trivial mitral valve regurgitation. No evidence of mitral stenosis. 7. The aortic valve is tricuspid. Aortic valve regurgitation is not visualized. No aortic stenosis is present. 8. The inferior vena cava is dilated in size with <50% respiratory variability, suggesting right atrial pressure of 15 mmHg. Comparison(s): Changes from prior study are noted.  03/14/2020 LEFT HEART CATH AND CORONARY ANGIOGRAPHY   Conclusion    Previously placed Mid RCA to Dist RCA stent (unknown type) is widely patent.  Prox Cx lesion is 50% stenosed.  Mid RCA lesion is 40% stenosed.  Mid LAD-1 lesion is 50% stenosed.  Mid LAD-2 lesion is 100% stenosed with 100% stenosed side branch in 2nd Sept.  Mid LAD-3 lesion is 100% stenosed.   Diagnostic Dominance: Right       Assessment and Plan:  1.  Chronic combined systolic and diastolic heart failure ([place])   2. Mixed hyperlipidemia   3. CAD in native artery    1. Mixed hyperlipidemia Continue Repatha 140 mg subcu q. 14 days.  Last lipid panel 04/15/2020: TC 124, TG 289, HDL 33, LDL 47.  2. CAD in native artery Denies any anginal symptoms or nitroglycerin use.  Continue aspirin 81 mg daily, Plavix 75 mg daily, Imdur 60 mg daily, nitroglycerin sublingual as needed.  Cardiac catheterization July 2021 demonstrated diffusely diseased LAD.  Dr. Gwenlyn Found commented given the fact that there were no collaterals, he suspected no viable myocardium beyond  occluded mid LAD and therefore no benefit will be derived from opening the diffusely diseased vessel.  3. Chronic combined systolic and diastolic heart failure ([place]) EKG 05/30/2020 sinus rhythm with first-degree AV block, right superior axis deviation, incomplete right bundle branch block rate of 82. Last echocardiogram 03/04/2020 EF 40 to 45% positive R WMA's, moderate concentric LVH.  Grade 3 DD restrictive.  Mildly elevated PASP, LA moderately dilated, RA mildly dilated.  Large pericardial effusion (circumferential).  No evidence of tamponade.  Trivial MR. Please repeat echocardiogram prior to 42-month follow-up.  Medication Adjustments/Labs and Tests Ordered: Current medicines are reviewed at length with the patient today.  Concerns regarding medicines are outlined above.   Disposition: Follow-up with Dr. [name] or APP 6 months  Signed, Levell July, NP 10/17/2020 4:13 PM    [facility] [place], [place], [place] [address] Phone: [phone]; Fax: [phone]

## 2020-10-17 ENCOUNTER — Ambulatory Visit: Payer: Medicare HMO | Admitting: Family Medicine

## 2020-10-17 ENCOUNTER — Encounter: Payer: Self-pay | Admitting: Family Medicine

## 2020-10-17 VITALS — BP 152/82 | HR 72 | Ht 73.0 in | Wt 260.8 lb

## 2020-10-17 DIAGNOSIS — I2510 Atherosclerotic heart disease of native coronary artery without angina pectoris: Secondary | ICD-10-CM

## 2020-10-17 DIAGNOSIS — E782 Mixed hyperlipidemia: Secondary | ICD-10-CM

## 2020-10-17 DIAGNOSIS — I5042 Chronic combined systolic (congestive) and diastolic (congestive) heart failure: Secondary | ICD-10-CM | POA: Diagnosis not present

## 2020-10-17 DIAGNOSIS — I5032 Chronic diastolic (congestive) heart failure: Secondary | ICD-10-CM

## 2020-10-17 NOTE — Patient Instructions (Signed)
Medication Instructions:  Continue all current medications.  Labwork: none  Testing/Procedures: Your physician has requested that you have an echocardiogram. Echocardiography is a painless test that uses sound waves to create images of your heart. It provides your doctor with information about the size and shape of your heart and how well your heart's chambers and valves are working. This procedure takes approximately one hour. There are no restrictions for this procedure - DUE JUST PRIOR TO NEXT VISIT   Follow-Up: 6 months   Any Other Special Instructions Will Be Listed Below (If Applicable).  If you need a refill on your cardiac medications before your next appointment, please call your pharmacy.  

## 2020-10-17 NOTE — Progress Notes (Deleted)
    Subjective:    CC: L knee pain  I, Maxim Bedel, LAT, ATC, am serving as scribe for Dr. Lynne Leader.  HPI: Pt is a 65 y/o male presenting w/ c/o L knee pain x .  He locates his pain to .  Radiating pain: L knee swelling: L knee mechanical symptoms: Aggravating factors: Treatments tried:  Pertinent review of Systems: ***  Relevant historical information: ***   Objective:   There were no vitals filed for this visit. General: Well Developed, well nourished, and in no acute distress.   MSK: ***  Lab and Radiology Results No results found. However, due to the size of the patient record, not all encounters were searched. Please check Results Review for a complete set of results. No results found.    Impression and Recommendations:    Assessment and Plan: 65 y.o. male with ***.  PDMP not reviewed this encounter. No orders of the defined types were placed in this encounter.  No orders of the defined types were placed in this encounter.   Discussed warning signs or symptoms. Please see discharge instructions. Patient expresses understanding.   ***

## 2020-10-18 ENCOUNTER — Encounter: Payer: Self-pay | Admitting: *Deleted

## 2020-10-18 ENCOUNTER — Ambulatory Visit: Payer: Medicare HMO | Admitting: Family Medicine

## 2020-10-18 ENCOUNTER — Ambulatory Visit: Payer: Medicare HMO | Admitting: Nurse Practitioner

## 2020-10-21 ENCOUNTER — Ambulatory Visit: Payer: Medicare HMO | Admitting: Podiatry

## 2020-10-21 DIAGNOSIS — G4733 Obstructive sleep apnea (adult) (pediatric): Secondary | ICD-10-CM | POA: Diagnosis not present

## 2020-10-21 DIAGNOSIS — I509 Heart failure, unspecified: Secondary | ICD-10-CM | POA: Diagnosis not present

## 2020-10-21 DIAGNOSIS — I5033 Acute on chronic diastolic (congestive) heart failure: Secondary | ICD-10-CM | POA: Diagnosis not present

## 2020-10-21 DIAGNOSIS — I5023 Acute on chronic systolic (congestive) heart failure: Secondary | ICD-10-CM | POA: Diagnosis not present

## 2020-11-02 NOTE — Progress Notes (Signed)
[facility] [address] [place] Phone: [phone] Subjective:   I Eugene [name] am serving as a Education administrator for Dr. Hulan [name].  This visit occurred during the SARS-CoV-2 public health emergency.  Safety protocols were in place, including screening questions prior to the visit, additional usage of staff PPE, and extensive cleaning of exam room while observing appropriate contact time as indicated for disinfecting solutions.   I'm seeing this patient by the request  of:  Eugene [name], Eugene Snare, MD  CC: Left knee pain  [id number]  Eugene [name] is a 65 y.o. male coming in with complaint of left knee pain. Patient states the knee has been bothering him for the past 2 weeks. Noticed swelling and took naproxen. States the knee is popping. Pain keeps him up at night. Another physician told him he may have gout. Patient is on blood thinners.   Onset- 2 weeks  Location - lateral patella  Character- nagging, sharp Aggravating factors- walking, laying down  Reliving factors-  Therapies tried- naproxen, bio freeze, tiger balm  Severity- 4-5/10 at its worse       Past Medical History:  Diagnosis Date  . Anemia   . Anxiety   . Anxiety   . Arteriosclerotic cardiovascular disease (ASCVD)    a. 05/2011 s/p DES to PDA and RCA. b. 08/2012 Inflat  STEMI/Cath/PCI: LM minor irregs, LAD 50p, D1 50, LCX nl, OM1 25, RCA 30-40p, 100d (treated with 2.75x54mm Promus Premier DES);    [name] Asthma   . Bell palsy   . C. difficile colitis    a. 08/2012  . Cholelithiasis 07/2012   Asymptomatic; identified incidentally  . Chronic diastolic CHF (congestive heart failure) ([place])   . CKD (chronic kidney disease), stage III ([place])   . Contrast dye induced nephropathy    a. 08/2012 post cath/pci  . COPD (chronic obstructive pulmonary disease) ([place])   . Diabetes mellitus    Peripheral neuropathy  . Gallstones   . GERD (gastroesophageal reflux disease)   . Gout   .  Hyperlipidemia   . Hypertension   . Myocardial infarct ([place]) 09/08/12  . Nephrolithiasis   . Old myocardial infarction   . Pericardial effusion    a. s/p window in 2014. b. s/p redo window in 09/2018.  [name] Peripheral neuropathy   . PONV (postoperative nausea and vomiting)   . Sleep apnea   . Statin intolerance    Past Surgical History:  Procedure Laterality Date  . CARDIAC CATHETERIZATION    . CHEST TUBE INSERTION    . CIRCUMCISION    . COLONOSCOPY     In [facility], approximately 2011 per patient, was normal. Advised to come back in 10 years.  . ESOPHAGOGASTRODUODENOSCOPY     in [place] over 20 yrs ago  . ESOPHAGOGASTRODUODENOSCOPY  06/12/2012   [id number] esophagus-status post Venia Minks dilation. Abnormal gastric mucosa of uncertain significance-status post biopsy  . EYE SURGERY    . INTRAOPERATIVE TRANSESOPHAGEAL ECHOCARDIOGRAM N/A 06/23/2013   Procedure: INTRAOPERATIVE TRANSESOPHAGEAL ECHOCARDIOGRAM;  Surgeon: Grace Isaac, MD;  Location: [facility];  Service: Open Heart Surgery;  Laterality: N/A;  . LEFT HEART CATH AND CORONARY ANGIOGRAPHY N/A 03/14/2020   Procedure: LEFT HEART CATH AND CORONARY ANGIOGRAPHY;  Surgeon: Lorretta Harp, MD;  Location: [facility];  Service: Cardiovascular;  Laterality: N/A;  . LEFT HEART CATHETERIZATION WITH CORONARY ANGIOGRAM N/A 09/08/2012   Procedure: LEFT HEART CATHETERIZATION WITH CORONARY ANGIOGRAM;  Surgeon: Legrand Como  Burt Knack, MD;  Location: [facility] CATH LAB;  Service: Cardiovascular;  Laterality: N/A;  . stents    . SUBXYPHOID PERICARDIAL WINDOW N/A 06/23/2013   Procedure: SUBXYPHOID PERICARDIAL WINDOW;  Surgeon: Grace Isaac, MD;  Location: [facility];  Service: Thoracic;  Laterality: N/A;  . SUBXYPHOID PERICARDIAL WINDOW  10/01/2018  . SUBXYPHOID PERICARDIAL WINDOW N/A 10/01/2018   Procedure: REDO SUBXYPHOID PERICARDIAL WINDOW;  Surgeon: Grace Isaac, MD;  Location: [facility];  Service: Thoracic;  Laterality: N/A;  . TEE  WITHOUT CARDIOVERSION N/A 10/01/2018   Procedure: TRANSESOPHAGEAL ECHOCARDIOGRAM (TEE);  Surgeon: Grace Isaac, MD;  Location: [facility] OR;  Service: Thoracic;  Laterality: N/A;   Social History   Socioeconomic History  . Marital status: Married    Spouse name: Not on file  . Number of children: 2  . Years of education: Not on file  . Highest education level: Not on file  Occupational History  . 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 Smoking status: Never Smoker  . Smokeless tobacco: Never Used  Vaping Use  . Vaping Use: Never used  Substance and Sexual Activity  . Alcohol use: No    Alcohol/week: 0.0 standard drinks    Comment: heavy etoh use 30 years ago  . Drug use: No  . Sexual activity: Yes    Birth control/protection: None  Other Topics Concern  . 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 Disabled at this point.   Social Determinants of Health   Financial Resource Strain: Not on file  Food Insecurity: Not on file  Transportation Needs: Not on file  Physical Activity: Not on file  Stress: Not on file  Social Connections: Not on file   Allergies  Allergen Reactions  . Hydrocodone Nausea And Vomiting  . Lisinopril Cough  . Neurontin [Gabapentin] Other (See Comments)    Reaction:  Suicidal thoughts   . Statins Other (See Comments)    Reaction:  Muscle pain   . Metformin And Related Diarrhea  . Norvasc [Amlodipine Besylate] Swelling and Other (See Comments)    Reaction:  Pedal edema   Family History  Problem Relation Age of Onset  . Diabetes Mother   . Heart attack Mother   . Stroke Mother   . Diabetes Sister   . Sleep apnea Sister   . Hypertension Brother   . Diabetes Brother   . Diabetes Brother   . Hypertension Brother   . Colon cancer Neg Hx   . Liver disease Neg Hx     Current Outpatient Medications (Endocrine & Metabolic):  .  glimepiride  (AMARYL) 1 MG tablet, TAKE 1 TABLET WITH MEALS AS DIRECTED. MAX 3 TABLETS PER DAY .  LEVEMIR FLEXTOUCH 100 UNIT/ML FlexPen, INJECT 0.8 MLS (80 UNITS TOTAL) INTO THE SKIN DAILY.  Current Outpatient Medications (Cardiovascular):  [name]  Evolocumab (REPATHA SURECLICK) 474 MG/ML SOAJ, Inject 1 Dose into the skin every 14 (fourteen) days. .  isosorbide mononitrate (IMDUR) 60 MG 24 hr tablet, Take 1 tablet (60 mg total) by mouth daily. [name]  losartan (COZAAR) 25 MG tablet, Take 25 mg by mouth daily. .  metoprolol succinate (TOPROL-XL) 50 MG 24 hr tablet, Take 1 tablet (50 mg total) by mouth daily. Take with or immediately following a meal. .  nitroGLYCERIN (NITROSTAT) 0.4 MG SL tablet, Place 0.4 mg under the tongue every 5 (five) minutes  as needed for chest pain. [name]  torsemide (DEMADEX) 20 MG tablet, TAKE 2 TABLETS EVERY DAY   Current Outpatient Medications (Analgesics):  .  acetaminophen (TYLENOL) 500 MG tablet, Take 1,500 mg by mouth every 6 (six) hours as needed for mild pain, fever or headache.  .  allopurinol (ZYLOPRIM) 100 MG tablet, Take 100 mg by mouth as needed. [name]  aspirin 81 MG EC tablet, Take 81 mg by mouth daily. .  colchicine 0.6 MG tablet, Take 2 tablets by mouth at first sign of gout flare up then 1 twice daily PRN .  traMADol (ULTRAM) 50 MG tablet, TAKE 1 TABLET (50 MG TOTAL) BY MOUTH EVERY 6 (SIX) HOURS AS NEEDED  Current Outpatient Medications (Hematological):  .  clopidogrel (PLAVIX) 75 MG tablet, Take 1 tablet (75 mg total) by mouth daily.  Current Outpatient Medications (Other):  [name]  ALPRAZolam (XANAX) 0.5 MG tablet, Take 1-2 tablets (0.5-1 mg total) by mouth at bedtime as needed for sleep. .  Cholecalciferol 25 MCG (1000 UT) capsule, Take 1 capsule by mouth daily. .  DROPLET PEN NEEDLES 32G X 4 MM MISC, USE EVERY DAY AS DIRECTED .  ofloxacin (FLOXIN) 0.3 % OTIC solution, 5 drops as needed. .  pantoprazole (PROTONIX) 40 MG tablet, Take 1 tablet (40 mg total) by mouth daily. .   sucralfate (CARAFATE) 1 g tablet, One 3 times a day for 2 weeks   Reviewed prior external information including notes and imaging from  primary care provider As well as notes that were available from care everywhere and other healthcare systems.  Past medical history, social, surgical and family history all reviewed in electronic medical record.  No pertanent information unless stated regarding to the chief complaint.   Review of Systems:  No headache, visual changes, nausea, vomiting, diarrhea, constipation, dizziness, abdominal pain, skin rash, fevers, chills, night sweats, weight loss, swollen lymph nodes, body aches, joint swelling, chest pain, shortness of breath, mood changes. POSITIVE muscle aches  Objective  Blood pressure (!) 154/82, pulse 67, height 6\' 1"  (1.854 m), weight 255 lb (115.7 kg), SpO2 100 %.   General: No apparent distress alert and oriented x3 mood and affect normal, dressed appropriately.  HEENT: Pupils equal, extraocular movements intact  Respiratory: Patient's speak in full sentences and does not appear short of breath  Cardiovascular: No lower extremity edema, non tender, no erythema  Gait normal with good balance and coordination.  MSK:  N left knee exam does have some mild loss of lordosis.  Trace effusion noted.  Patient has good range of motion with mild crepitus noted.  Good stability with valgus and varus force.  Lacks last 5 degrees of flexion  Limited musculoskeletal ultrasound was performed and interpreted by Lyndal Pulley  Limited ultrasound of patient's left knee shows the patient does have some mild narrowing noted of the patellofemoral joint.  Patient has a trace effusion of the knee noted.  Patient does have more soft tissue mild swelling noted of the anterior lateral aspect of the knee.  Some mild calcific changes that can be consistent with potential gouty deposits.  Otherwise fairly unremarkable.  No sign of any type of infectious  etiology Impression: Soft tissue changes that could be consistent with gout but also trace effusion of the knee.    Impression and Recommendations:     The above documentation has been reviewed and is accurate and complete Lyndal Pulley, DO

## 2020-11-03 ENCOUNTER — Ambulatory Visit: Payer: Self-pay

## 2020-11-03 ENCOUNTER — Other Ambulatory Visit: Payer: Self-pay

## 2020-11-03 ENCOUNTER — Ambulatory Visit (INDEPENDENT_AMBULATORY_CARE_PROVIDER_SITE_OTHER): Payer: Medicare HMO

## 2020-11-03 ENCOUNTER — Encounter: Payer: Self-pay | Admitting: Family Medicine

## 2020-11-03 ENCOUNTER — Ambulatory Visit: Payer: Medicare HMO | Admitting: Family Medicine

## 2020-11-03 VITALS — BP 154/82 | HR 67 | Ht 73.0 in | Wt 255.0 lb

## 2020-11-03 DIAGNOSIS — M25562 Pain in left knee: Secondary | ICD-10-CM | POA: Diagnosis not present

## 2020-11-03 DIAGNOSIS — G8929 Other chronic pain: Secondary | ICD-10-CM

## 2020-11-03 NOTE — Assessment & Plan Note (Signed)
Acute onset and worsening pain.  Patient does have some mild degenerative changes noted on ultrasound and x-rays are pending.  Patient does have some soft tissue swelling noted that likely is potentially some gout that seems to be resolving.  Patient is on allopurinol and encouraged him to continue that on a regular basis.  Patient is using it more as needed.  Patient is on a blood thinner and encouraged him not to take the time laboratories but could potentially consider a short course of topical Voltaren over-the-counter that could be beneficial.  We did give a small trial of Pennsaid as well.  Warned of potential side effects and discontinuing if any type of rash or bruising noted.  Then will transition the Voltaren.  Discussed icing regimen.  Patient will follow up with me in 3 to.  If continuing to have difficulty we may need to consider formal physical therapy or if any instability advanced imaging.

## 2020-11-03 NOTE — Patient Instructions (Addendum)
Good to see you Xray today Pennsaid small amount 2 times a day Avoid any antiinflammatories  Ice 20 mins 2 times a day Patellofemoral exercises today Tart cherry 1200 mg at night See me again in 3-4 weeks  If knee swells or worsening pain call us or seek medical attention

## 2020-11-04 ENCOUNTER — Emergency Department (HOSPITAL_COMMUNITY): Payer: Medicare HMO

## 2020-11-04 ENCOUNTER — Observation Stay (HOSPITAL_COMMUNITY)
Admission: EM | Admit: 2020-11-04 | Discharge: 2020-11-06 | Disposition: A | Discharge status: Home / Self Care | Payer: Medicare HMO | Attending: Family Medicine | Admitting: Family Medicine

## 2020-11-04 ENCOUNTER — Other Ambulatory Visit: Payer: Self-pay

## 2020-11-04 ENCOUNTER — Emergency Department (HOSPITAL_BASED_OUTPATIENT_CLINIC_OR_DEPARTMENT_OTHER): Payer: Medicare HMO

## 2020-11-04 ENCOUNTER — Encounter (HOSPITAL_COMMUNITY): Payer: Self-pay | Admitting: Cardiology

## 2020-11-04 DIAGNOSIS — N189 Chronic kidney disease, unspecified: Secondary | ICD-10-CM | POA: Diagnosis not present

## 2020-11-04 DIAGNOSIS — Z20822 Contact with and (suspected) exposure to covid-19: Secondary | ICD-10-CM | POA: Diagnosis not present

## 2020-11-04 DIAGNOSIS — R0989 Other specified symptoms and signs involving the circulatory and respiratory systems: Secondary | ICD-10-CM | POA: Diagnosis present

## 2020-11-04 DIAGNOSIS — Z79899 Other long term (current) drug therapy: Secondary | ICD-10-CM | POA: Insufficient documentation

## 2020-11-04 DIAGNOSIS — N1832 Chronic kidney disease, stage 3b: Secondary | ICD-10-CM | POA: Diagnosis not present

## 2020-11-04 DIAGNOSIS — D649 Anemia, unspecified: Secondary | ICD-10-CM | POA: Diagnosis not present

## 2020-11-04 DIAGNOSIS — I214 Non-ST elevation (NSTEMI) myocardial infarction: Principal | ICD-10-CM

## 2020-11-04 DIAGNOSIS — R079 Chest pain, unspecified: Secondary | ICD-10-CM | POA: Diagnosis not present

## 2020-11-04 DIAGNOSIS — Z9861 Coronary angioplasty status: Secondary | ICD-10-CM

## 2020-11-04 DIAGNOSIS — Z7982 Long term (current) use of aspirin: Secondary | ICD-10-CM | POA: Insufficient documentation

## 2020-11-04 DIAGNOSIS — E782 Mixed hyperlipidemia: Secondary | ICD-10-CM

## 2020-11-04 DIAGNOSIS — N183 Chronic kidney disease, stage 3 unspecified: Secondary | ICD-10-CM | POA: Insufficient documentation

## 2020-11-04 DIAGNOSIS — I313 Pericardial effusion (noninflammatory): Secondary | ICD-10-CM | POA: Diagnosis not present

## 2020-11-04 DIAGNOSIS — I130 Hypertensive heart and chronic kidney disease with heart failure and stage 1 through stage 4 chronic kidney disease, or unspecified chronic kidney disease: Secondary | ICD-10-CM | POA: Diagnosis not present

## 2020-11-04 DIAGNOSIS — E0865 Diabetes mellitus due to underlying condition with hyperglycemia: Secondary | ICD-10-CM

## 2020-11-04 DIAGNOSIS — J449 Chronic obstructive pulmonary disease, unspecified: Secondary | ICD-10-CM | POA: Diagnosis not present

## 2020-11-04 DIAGNOSIS — I10 Essential (primary) hypertension: Secondary | ICD-10-CM | POA: Diagnosis present

## 2020-11-04 DIAGNOSIS — E1122 Type 2 diabetes mellitus with diabetic chronic kidney disease: Secondary | ICD-10-CM | POA: Insufficient documentation

## 2020-11-04 DIAGNOSIS — I25119 Atherosclerotic heart disease of native coronary artery with unspecified angina pectoris: Secondary | ICD-10-CM | POA: Diagnosis not present

## 2020-11-04 DIAGNOSIS — I5043 Acute on chronic combined systolic (congestive) and diastolic (congestive) heart failure: Secondary | ICD-10-CM | POA: Diagnosis not present

## 2020-11-04 DIAGNOSIS — Z794 Long term (current) use of insulin: Secondary | ICD-10-CM | POA: Diagnosis not present

## 2020-11-04 DIAGNOSIS — R0602 Shortness of breath: Secondary | ICD-10-CM | POA: Diagnosis not present

## 2020-11-04 DIAGNOSIS — J45909 Unspecified asthma, uncomplicated: Secondary | ICD-10-CM | POA: Insufficient documentation

## 2020-11-04 DIAGNOSIS — I200 Unstable angina: Secondary | ICD-10-CM | POA: Diagnosis not present

## 2020-11-04 DIAGNOSIS — I5032 Chronic diastolic (congestive) heart failure: Secondary | ICD-10-CM

## 2020-11-04 DIAGNOSIS — I2510 Atherosclerotic heart disease of native coronary artery without angina pectoris: Secondary | ICD-10-CM

## 2020-11-04 DIAGNOSIS — I129 Hypertensive chronic kidney disease with stage 1 through stage 4 chronic kidney disease, or unspecified chronic kidney disease: Secondary | ICD-10-CM | POA: Diagnosis not present

## 2020-11-04 HISTORY — DX: Type 2 diabetes mellitus without complications: E11.9

## 2020-11-04 HISTORY — DX: Atherosclerotic heart disease of native coronary artery without angina pectoris: I25.10

## 2020-11-04 LAB — ECHOCARDIOGRAM COMPLETE
Area-P 1/2: 5.42 cm2
Height: 73 in
S' Lateral: 3.09 cm
Weight: 4080 oz

## 2020-11-04 LAB — BASIC METABOLIC PANEL
Anion gap: 13 (ref 5–15)
BUN: 51 mg/dL — ABNORMAL HIGH (ref 8–23)
CO2: 25 mmol/L (ref 22–32)
Calcium: 9.1 mg/dL (ref 8.9–10.3)
Chloride: 98 mmol/L (ref 98–111)
Creatinine, Ser: 1.89 mg/dL — ABNORMAL HIGH (ref 0.61–1.24)
GFR, Estimated: 39 mL/min — ABNORMAL LOW (ref >60)
Glucose, Bld: 246 mg/dL — ABNORMAL HIGH (ref 70–99)
Potassium: 4.2 mmol/L (ref 3.5–5.1)
Sodium: 136 mmol/L (ref 135–145)

## 2020-11-04 LAB — CBC
HCT: 34.4 % — ABNORMAL LOW (ref 39.0–52.0)
Hemoglobin: 11 g/dL — ABNORMAL LOW (ref 13.0–17.0)
MCH: 29.6 pg (ref 26.0–34.0)
MCHC: 32 g/dL (ref 30.0–36.0)
MCV: 92.7 fL (ref 80.0–100.0)
Platelets: 174 10*3/uL (ref 150–400)
RBC: 3.71 MIL/uL — ABNORMAL LOW (ref 4.22–5.81)
RDW: 16.4 % — ABNORMAL HIGH (ref 11.5–15.5)
WBC: 5.1 10*3/uL (ref 4.0–10.5)
nRBC: 0 % (ref 0.0–0.2)

## 2020-11-04 LAB — TROPONIN I (HIGH SENSITIVITY)
Troponin I (High Sensitivity): 207 ng/L (ref <18)
Troponin I (High Sensitivity): 198 ng/L (ref <18)
Troponin I (High Sensitivity): 199 ng/L (ref <18)

## 2020-11-04 LAB — RESP PANEL BY RT-PCR (FLU A&B, COVID) ARPGX2
Influenza A by PCR: NEGATIVE
Influenza B by PCR: NEGATIVE
SARS Coronavirus 2 by RT PCR: NEGATIVE

## 2020-11-04 LAB — GLUCOSE, CAPILLARY: Glucose-Capillary: 155 mg/dL — ABNORMAL HIGH (ref 70–99)

## 2020-11-04 MED ORDER — ASPIRIN EC 81 MG PO TBEC
81.0000 mg | DELAYED_RELEASE_TABLET | Freq: Every day | ORAL | Status: DC
  Administered 2020-11-05 – 2020-11-06 (×2): 81 mg via ORAL
  Filled 2020-11-04 (×2): qty 1

## 2020-11-04 MED ORDER — ISOSORBIDE MONONITRATE ER 60 MG PO TB24
60.0000 mg | ORAL_TABLET | Freq: Every day | ORAL | Status: DC
  Administered 2020-11-05 – 2020-11-06 (×2): 60 mg via ORAL
  Filled 2020-11-04 (×2): qty 1

## 2020-11-04 MED ORDER — INSULIN DETEMIR 100 UNIT/ML ~~LOC~~ SOLN
60.0000 [IU] | Freq: Every day | SUBCUTANEOUS | Status: DC
  Administered 2020-11-04 – 2020-11-05 (×2): 60 [IU] via SUBCUTANEOUS
  Filled 2020-11-04 (×5): qty 0.6

## 2020-11-04 MED ORDER — ONDANSETRON HCL 4 MG/2ML IJ SOLN: 4.0000 mg | Freq: Four times a day (QID) | INTRAMUSCULAR | Status: DC | PRN

## 2020-11-04 MED ORDER — LOSARTAN POTASSIUM 50 MG PO TABS
25.0000 mg | ORAL_TABLET | Freq: Every day | ORAL | Status: DC
  Administered 2020-11-05 – 2020-11-06 (×2): 25 mg via ORAL
  Filled 2020-11-04 (×2): qty 1

## 2020-11-04 MED ORDER — ALLOPURINOL 100 MG PO TABS: 100.0000 mg | ORAL_TABLET | Freq: Every day | ORAL | Status: DC | PRN

## 2020-11-04 MED ORDER — ESOMEPRAZOLE MAGNESIUM 20 MG PO PACK: 20.0000 mg | PACK | Freq: Every day | ORAL | Status: DC

## 2020-11-04 MED ORDER — INSULIN ASPART 100 UNIT/ML ~~LOC~~ SOLN: 0.0000 [IU] | Freq: Every day | SUBCUTANEOUS | Status: DC

## 2020-11-04 MED ORDER — ALPRAZOLAM 0.5 MG PO TABS: 0.5000 mg | ORAL_TABLET | Freq: Every evening | ORAL | Status: DC | PRN

## 2020-11-04 MED ORDER — PANTOPRAZOLE SODIUM 40 MG PO TBEC
40.0000 mg | DELAYED_RELEASE_TABLET | Freq: Every day | ORAL | Status: DC
  Administered 2020-11-05 – 2020-11-06 (×2): 40 mg via ORAL
  Filled 2020-11-04 (×2): qty 1

## 2020-11-04 MED ORDER — INSULIN ASPART 100 UNIT/ML ~~LOC~~ SOLN
0.0000 [IU] | Freq: Three times a day (TID) | SUBCUTANEOUS | Status: DC
  Administered 2020-11-05: 3 [IU] via SUBCUTANEOUS
  Administered 2020-11-05 – 2020-11-06 (×2): 2 [IU] via SUBCUTANEOUS

## 2020-11-04 MED ORDER — METOPROLOL SUCCINATE ER 50 MG PO TB24
50.0000 mg | ORAL_TABLET | Freq: Every day | ORAL | Status: DC
  Administered 2020-11-05 – 2020-11-06 (×2): 50 mg via ORAL
  Filled 2020-11-04 (×2): qty 1

## 2020-11-04 MED ORDER — ENOXAPARIN SODIUM 40 MG/0.4ML ~~LOC~~ SOLN
40.0000 mg | SUBCUTANEOUS | Status: DC
  Administered 2020-11-04 – 2020-11-05 (×2): 40 mg via SUBCUTANEOUS
  Filled 2020-11-04 (×2): qty 0.4

## 2020-11-04 MED ORDER — TORSEMIDE 20 MG PO TABS
40.0000 mg | ORAL_TABLET | Freq: Every day | ORAL | Status: DC
  Administered 2020-11-05: 40 mg via ORAL
  Filled 2020-11-04: qty 2

## 2020-11-04 MED ORDER — ACETAMINOPHEN 325 MG PO TABS: 650.0000 mg | ORAL_TABLET | ORAL | Status: DC | PRN

## 2020-11-04 MED ORDER — CLOPIDOGREL BISULFATE 75 MG PO TABS
75.0000 mg | ORAL_TABLET | Freq: Every day | ORAL | Status: DC
  Administered 2020-11-05 – 2020-11-06 (×2): 75 mg via ORAL
  Filled 2020-11-04 (×2): qty 1

## 2020-11-04 MED ORDER — NITROGLYCERIN 0.4 MG SL SUBL
0.4000 mg | SUBLINGUAL_TABLET | SUBLINGUAL | Status: DC | PRN
  Administered 2020-11-04 (×2): 0.4 mg via SUBLINGUAL
  Filled 2020-11-04: qty 1

## 2020-11-04 NOTE — Consult Note (Signed)
Cardiology Consultation:   Patient ID: Eugene [name]; [id number]; 08/13/1956   Admit date: 11/04/2020 Date of Consult: 11/04/2020  Primary Care Provider: [name], MD Primary Cardiologist: [name], MD Primary Electrophysiologist: None   Patient Profile:   Eugene [name] is a 65 y.o. male with a history of CAD status post previous DES to the RCA in 2012 and 2014 including more recent documentation of occluded mid LAD in July 2021 that was managed medically, CKD stage IIIb, COPD, hypertension, hyperlipidemia, and recurrent pericardial effusion status post previous windows in 2014 and 2020 who is being seen today for the evaluation of chest pain at the request of Dr. Gilford Raid.  History of Present Illness:   Mr. Mcwhirter presents to the [facility] ER reporting recurrent chest discomfort since yesterday.  He does not recall any obvious precipitant, states that he has been taking his medications regularly.  He did try nitroglycerin at home without much effect, this has provided somewhat more benefit in the ER.  He is a former patient of Dr. Bronson Ing, now established with Dr. [name] for future follow-up, and most recently seen by Mr. Leonides Sake NP in February at that point he was clinically stable.  Cardiac catheterization from July 2021 in the setting of NSTEMI revealed patent stent sites in the mid and distal RCA with occluded mid LAD that was managed medically by Dr. Gwenlyn Found.  Echocardiogram at that time revealed LVEF 40 to 45% with hypokinesis of the mid to distal anterior wall.  Large circumferential pericardial effusion also noted at that time with largest measurement 3.4 cm posteriorly and no clear evidence of tamponade.  Follow-up ECG today is consistent with old anterior infarct pattern, no significant changes compared to prior tracing.  High-sensitivity troponin I levels are 207 and 198.  He has CKD stage IIIb with current creatinine 1.89.  Chest x-ray reports no  acute process with basilar scarring.  Past Medical History:  Diagnosis Date  . Anemia   . Anxiety   . Asthma   . Bell palsy   . C. difficile colitis    a. 08/2012  . CAD (coronary artery disease)    a. 05/2011 s/p DES to PDA and RCA. b. 08/2012 Inflat  STEMI/Cath/PCI: LM minor irregs, LAD 50p, D1 50, LCX nl, OM1 25, RCA 30-40p, 100d (treated with 2.75x74mm Promus Premier DES);    [name] Cholelithiasis 07/2012   Asymptomatic; identified incidentally  . Chronic diastolic CHF (congestive heart failure) ([place])   . CKD (chronic kidney disease), stage III ([place])   . Contrast dye induced nephropathy    a. 08/2012 post cath/pci  . COPD (chronic obstructive pulmonary disease) ([place])   . GERD (gastroesophageal reflux disease)   . Gout   . Hyperlipidemia   . Hypertension   . Myocardial infarct ([place]) 09/08/12  . Nephrolithiasis   . Pericardial effusion    a. s/p window in 2014. b. s/p redo window in 09/2018.  [name] Peripheral neuropathy   . Sleep apnea   . Statin intolerance   . Type 2 diabetes mellitus ([place])     Past Surgical History:  Procedure Laterality Date  . CARDIAC CATHETERIZATION    . CHEST TUBE INSERTION    . CIRCUMCISION    . COLONOSCOPY     In [facility], approximately 2011 per patient, was normal. Advised to come back in 10 years.  . ESOPHAGOGASTRODUODENOSCOPY     in [place] over 20 yrs ago  . ESOPHAGOGASTRODUODENOSCOPY  06/12/2012  NLZ:JQBHAL esophagus-status post [facility] dilation. Abnormal gastric mucosa of uncertain significance-status post biopsy  . EYE SURGERY    . INTRAOPERATIVE TRANSESOPHAGEAL ECHOCARDIOGRAM N/A 06/23/2013   Procedure: INTRAOPERATIVE TRANSESOPHAGEAL ECHOCARDIOGRAM;  Surgeon: Grace Isaac, MD;  Location: [place];  Service: Open Heart Surgery;  Laterality: N/A;  . LEFT HEART CATH AND CORONARY ANGIOGRAPHY N/A 03/14/2020   Procedure: LEFT HEART CATH AND CORONARY ANGIOGRAPHY;  Surgeon: Lorretta Harp, MD;  Location: [place] [facility];  Service:  Cardiovascular;  Laterality: N/A;  . LEFT HEART CATHETERIZATION WITH CORONARY ANGIOGRAM N/A 09/08/2012   Procedure: LEFT HEART CATHETERIZATION WITH CORONARY ANGIOGRAM;  Surgeon: [name], MD;  Location: [facility] CATH LAB;  Service: Cardiovascular;  Laterality: N/A;  . stents    . SUBXYPHOID PERICARDIAL WINDOW N/A 06/23/2013   Procedure: SUBXYPHOID PERICARDIAL WINDOW;  Surgeon: Grace Isaac, MD;  Location: [place];  Service: Thoracic;  Laterality: N/A;  . SUBXYPHOID PERICARDIAL WINDOW  10/01/2018  . SUBXYPHOID PERICARDIAL WINDOW N/A 10/01/2018   Procedure: REDO SUBXYPHOID PERICARDIAL WINDOW;  Surgeon: Grace Isaac, MD;  Location: [place];  Service: Thoracic;  Laterality: N/A;  . TEE WITHOUT CARDIOVERSION N/A 10/01/2018   Procedure: TRANSESOPHAGEAL ECHOCARDIOGRAM (TEE);  Surgeon: Grace Isaac, MD;  Location: [facility] OR;  Service: Thoracic;  Laterality: N/A;     Outpatient Medications: No current facility-administered medications on file prior to encounter.   Current Outpatient Medications on File Prior to Encounter  Medication Sig Dispense Refill  . Accu-Chek Softclix Lancets lancets 1 each 3 (three) times daily.    [name] acetaminophen (TYLENOL) 500 MG tablet Take 1,500 mg by mouth every 6 (six) hours as needed for mild pain, fever or headache.     . allopurinol (ZYLOPRIM) 100 MG tablet Take 100 mg by mouth as needed.    . ALPRAZolam (XANAX) 0.5 MG tablet Take 1-2 tablets (0.5-1 mg total) by mouth at bedtime as needed for sleep. 30 tablet 5  . aspirin 81 MG EC tablet Take 81 mg by mouth daily.    . Cholecalciferol 25 MCG (1000 UT) capsule Take 1 capsule by mouth daily.    . clopidogrel (PLAVIX) 75 MG tablet Take 1 tablet (75 mg total) by mouth daily. 90 tablet 3  . colchicine 0.6 MG tablet Take 2 tablets by mouth at first sign of gout flare up then 1 twice daily PRN 45 tablet 2  . DROPLET PEN NEEDLES 32G X 4 MM MISC USE EVERY DAY AS DIRECTED 100 each 0  . Evolocumab (REPATHA SURECLICK) 937  MG/ML SOAJ Inject 1 Dose into the skin every 14 (fourteen) days. 2 pen 11  . glimepiride (AMARYL) 1 MG tablet TAKE 1 TABLET WITH MEALS AS DIRECTED. MAX 3 TABLETS PER DAY 270 tablet 0  . isosorbide mononitrate (IMDUR) 60 MG 24 hr tablet Take 1 tablet (60 mg total) by mouth daily. 90 tablet 3  . LEVEMIR FLEXTOUCH 100 UNIT/ML FlexPen INJECT 0.8 MLS (80 UNITS TOTAL) INTO THE SKIN DAILY. 30 mL 0  . losartan (COZAAR) 25 MG tablet Take 25 mg by mouth daily.    . metoprolol succinate (TOPROL-XL) 50 MG 24 hr tablet Take 1 tablet (50 mg total) by mouth daily. Take with or immediately following a meal. 90 tablet 3  . nitroGLYCERIN (NITROSTAT) 0.4 MG SL tablet Place 0.4 mg under the tongue every 5 (five) minutes as needed for chest pain.    [name] ofloxacin (FLOXIN) 0.3 % OTIC solution 5 drops as needed.    . pantoprazole (  PROTONIX) 40 MG tablet Take 1 tablet (40 mg total) by mouth daily. 30 tablet 3  . sucralfate (CARAFATE) 1 g tablet One 3 times a day for 2 weeks 42 tablet 0  . torsemide (DEMADEX) 20 MG tablet TAKE 2 TABLETS EVERY DAY 90 tablet 5  . traMADol (ULTRAM) 50 MG tablet TAKE 1 TABLET (50 MG TOTAL) BY MOUTH EVERY 6 (SIX) HOURS AS NEEDED (Patient taking differently: Take 50 mg by mouth every 6 (six) hours as needed for moderate pain or severe pain.) 24 tablet 5    PRN Meds: nitroGLYCERIN  Allergies:    Allergies  Allergen Reactions  . Hydrocodone Nausea And Vomiting  . Lisinopril Cough  . Neurontin [Gabapentin] Other (See Comments)    Reaction:  Suicidal thoughts   . Statins Other (See Comments)    Reaction:  Muscle pain   . Metformin And Related Diarrhea  . Norvasc [Amlodipine Besylate] Swelling and Other (See Comments)    Reaction:  Pedal edema    Social History:   Social History   Tobacco Use  . Smoking status: Never Smoker  . Smokeless tobacco: Never Used  Substance Use Topics  . Alcohol use: No    Alcohol/week: 0.0 standard drinks    Comment: heavy etoh use 30 years ago     Family History:   The patient's family history includes Diabetes in his brother, brother, mother, and sister; Heart attack in his mother; Hypertension in his brother and brother; Sleep apnea in his sister; Stroke in his mother. There is no history of Colon cancer or Liver disease.  ROS:  Please see the history of present illness.  All other ROS reviewed and negative.     Physical Exam/Data:   Vitals:   11/04/20 1530 11/04/20 1545 11/04/20 1600 11/04/20 1615  BP:  139/82 (!) 149/84   Pulse: 73 80    Resp: (!) 25 19 (!) 23 10  Temp:      TempSrc:      SpO2: 96% 96%    Weight:      Height:       No intake or output data in the 24 hours ending 11/04/20 1636 Filed Weights   11/04/20 1328  Weight: 115.7 kg   Body mass index is 33.64 kg/m.   Gen: Patient appears comfortable at rest. HEENT: Conjunctiva and lids normal, oropharynx clear. Neck: Supple, no elevated JVP or carotid bruits, no thyromegaly. Lungs: Clear to auscultation, nonlabored breathing at rest. Cardiac: Regular rate and rhythm, no S3, soft systolic murmur, no pericardial rub. Abdomen: Soft, nontender, bowel sounds present, no guarding or rebound. Extremities: No pitting edema, distal pulses 2+. Skin: Warm and dry. Musculoskeletal: No kyphosis. Neuropsychiatric: Alert and oriented x3, affect grossly appropriate.  EKG:  An ECG dated 11/04/2020 was personally reviewed today and demonstrated:  Sinus rhythm with prolonged PR interval, old anterior infarct pattern with residual ST segment abnormalities consistent with prior tracing.  Low voltage in the limb leads.  Telemetry:  I personally reviewed telemetry which shows sinus rhythm.  Relevant CV Studies:  Echocardiogram 03/14/2020: 1. Left ventricular ejection fraction, by estimation, is 40 to 45%. The  left ventricle has mildly decreased function. The left ventricle  demonstrates regional wall motion abnormalities (see scoring  diagram/findings for  description). There is moderate  concentric left ventricular hypertrophy. Left ventricular diastolic  parameters are consistent with Grade III diastolic dysfunction  (restrictive).  2. Right ventricular systolic function is normal. The right ventricular  size is normal. There  is mildly elevated pulmonary artery systolic  pressure.  3. Left atrial size was moderately dilated.  4. Right atrial size was mildly dilated.  5. Largest dimension 3.4 cm posterior to RA without evidence of  tamponade.. Large pericardial effusion. The pericardial effusion is  circumferential. There is no evidence of cardiac tamponade.  6. The mitral valve is normal in structure. Trivial mitral valve  regurgitation. No evidence of mitral stenosis.  7. The aortic valve is tricuspid. Aortic valve regurgitation is not  visualized. No aortic stenosis is present.  8. The inferior vena cava is dilated in size with <50% respiratory  variability, suggesting right atrial pressure of 15 mmHg.   Cardiac catheterization 03/14/2020:  Previously placed Mid RCA to Dist RCA stent (unknown type) is widely patent.  Prox Cx lesion is 50% stenosed.  Mid RCA lesion is 40% stenosed.  Mid LAD-1 lesion is 50% stenosed.  Mid LAD-2 lesion is 100% stenosed with 100% stenosed side branch in 2nd Sept.  Mid LAD-3 lesion is 100% stenosed.  IMPRESSION: [name] has a total LAD in the midportion without collaterals.  He had ongoing chest pain for several days prior to admission suggesting that he had completed an out of hospital anterior STEMI.  He has Q waves across his precordium and troponins which are flat.  Given the fact that there were no collaterals I suspect that he has no viable myocardium beyond his occluded mid LAD and therefore no benefit would be derived from opening this diffusely diseased vessel.  2D echo is pending.  Recommend medical therapy.  If he has ongoing pain and an akinetic anterior wall we could consider  doing a viability cardiac MRI.    Laboratory Data:  Chemistry Recent Labs  Lab 11/04/20 1337  NA 136  K 4.2  CL 98  CO2 25  GLUCOSE 246*  BUN 51*  CREATININE 1.89*  CALCIUM 9.1  GFRNONAA 39*  ANIONGAP 13    No results for input(s): PROT, ALBUMIN, AST, ALT, ALKPHOS, BILITOT in the last 168 hours. Hematology Recent Labs  Lab 11/04/20 1337  WBC 5.1  RBC 3.71*  HGB 11.0*  HCT 34.4*  MCV 92.7  MCH 29.6  MCHC 32.0  RDW 16.4*  PLT 174   Cardiac Enzymes Recent Labs  Lab 11/04/20 1337 11/04/20 1510  TROPONINIHS 207* 198*    Radiology/Studies:  DG Chest 2 View  Result Date: 11/04/2020 CLINICAL DATA:  Chest pain and shortness of breath. EXAM: CHEST - 2 VIEW COMPARISON:  05/30/2020 and CT chest 09/11/2018. FINDINGS: Trachea is midline. Heart is enlarged. Mild basilar streaky opacities appear fairly chronic, as on 09/11/2018. No airspace consolidation or pleural fluid. IMPRESSION: 1. No acute findings. 2. Bibasilar scarring. Electronically Signed   By: Lorin Picket M.D.   On: 11/04/2020 14:14   DG Knee Complete 4 Views Left  Result Date: 11/04/2020 CLINICAL DATA:  Anterolateral left knee pain for 2 weeks, no injury. EXAM: LEFT KNEE - COMPLETE 4+ VIEW COMPARISON:  None. FINDINGS: No acute osseous or joint abnormality. No degenerative changes. Vascular calcifications. IMPRESSION: No findings to explain the patient's pain. Electronically Signed   By: Lorin Picket M.D.   On: 11/04/2020 14:11    Assessment and Plan:   1.  Recurrent chest pain in the setting of known CAD as discussed above, also large pericardial effusion by evaluation in July 2021 with previous pericardial windows.  Present ECG consistent with old anterior infarct pattern with residual ST segment abnormalities that are similar to  previous tracing.  Initial high-sensitivity troponin I levels are relatively flat at 207 and 198 not necessarily suggesting progressive ACS.  He has CKD stage IIIb at baseline  increasing risk of contrast nephropathy.  Currently hemodynamically stable.  2.  Large pericardial effusion, last assessed by echocardiography in July 2021.  No evidence of tamponade at that time.  He has undergone 2 prior pericardial windows, most recently in 2020.  3.  CKD stage IIIb, current creatinine 1.89.  4.  Mixed hyperlipidemia with statin intolerance.  He is currently on Repatha.  Last LDL 47 in August 2021.  Recommend admission for further evaluation, currently being admitted to the hospitalist service at [facility].  Would cycle cardiac enzymes and recheck ECG in a.m.  He needs a follow-up echocardiogram to both reevaluate LVEF and previous large pericardial effusion.  Would continue aspirin, Plavix, Repatha, Imdur, losartan, Toprol-XL, and Demadex for now.  Depending on enzyme trend and incidence of recurrent chest pain, further management been can be considered in terms of either modification of medical therapy or referral for repeat cardiac catheterization with increased risk of contrast nephropathy.  If echocardiogram shows enlarging pericardial effusion or hemodynamic significance, he will need further evaluation on the cardiology service at [facility].  Would also reach out to our cardiology team in [place] if he continues to have chest discomfort that cannot be stabilized medically.  Signed, Rozann Lesches, MD  11/04/2020 4:36 PM

## 2020-11-04 NOTE — ED Notes (Signed)
Date and time results received: 11/04/20 2:52 PM  Test: troponin Critical Value: 207  Name of Provider Notified: [email or web address]  Orders Received? Or Actions Taken?: n/a

## 2020-11-04 NOTE — H&P (Signed)
History and Physical  Eugene [name] CBJ:[id number] DOB: Jan 24, 1956 DOA: 11/04/2020  Referring physician: Dr Gilford Raid, ED physician PCP: [name], MD  Outpatient Specialists:   Patient Coming From: home  Chief Complaint: Chest pain  HPI: Red Mandt Eugene [name] is a 65 y.o. male with a history of coronary artery disease with cardiomyopathy and chronic diastolic heart failure, history of drug-eluting stents, hypertensive, COPD, pericardial effusion status post window in 2014 and 2020, diabetes on insulin.  Patient presents with chest pain that started last night in his right chest and radiates into his neck.  He took 2 nitroglycerin, which eased the pain and he was able to fall asleep.  When he woke up this morning, his pain had returned and he came to the emergency department for evaluation.  Pain occurs at rest and is not worsened with activity.  No other palliating or provoking factors.  Denies fevers, chills, nausea, vomiting.  Emergency Department Course: High sensitivity troponin 207 with 198 on repeat.  Creatinine 1.89 (around his baseline).  Cardiology was consulted.  Echo done  Review of Systems:   Pt denies any fevers, chills, nausea, vomiting, diarrhea, constipation, abdominal pain, shortness of breath, dyspnea on exertion, orthopnea, cough, wheezing, palpitations, headache, vision changes, lightheadedness, dizziness, melena, rectal bleeding.  Review of systems are otherwise negative  Past Medical History:  Diagnosis Date  . Anemia   . Anxiety   . Asthma   . Bell palsy   . C. difficile colitis    a. 08/2012  . CAD (coronary artery disease)    a. 05/2011 s/p DES to PDA and RCA. b. 08/2012 Inflat  STEMI/Cath/PCI: LM minor irregs, LAD 50p, D1 50, LCX nl, OM1 25, RCA 30-40p, 100d (treated with 2.75x31mm Promus Premier DES);    [name] Cholelithiasis 07/2012   Asymptomatic; identified incidentally  . Chronic diastolic CHF (congestive heart failure) ([place])   . CKD (chronic kidney  disease), stage III ([place])   . Contrast dye induced nephropathy    a. 08/2012 post cath/pci  . COPD (chronic obstructive pulmonary disease) ([place])   . GERD (gastroesophageal reflux disease)   . Gout   . Hyperlipidemia   . Hypertension   . Myocardial infarct ([place]) 09/08/12  . Nephrolithiasis   . Pericardial effusion    a. s/p window in 2014. b. s/p redo window in 09/2018.  [name] Peripheral neuropathy   . Sleep apnea   . Statin intolerance   . Type 2 diabetes mellitus ([place])    Past Surgical History:  Procedure Laterality Date  . CARDIAC CATHETERIZATION    . CHEST TUBE INSERTION    . CIRCUMCISION    . COLONOSCOPY     In [facility], approximately 2011 per patient, was normal. Advised to come back in 10 years.  . ESOPHAGOGASTRODUODENOSCOPY     [place] 20 yrs ago  . ESOPHAGOGASTRODUODENOSCOPY  06/12/2012   HYW:VPXTGG esophagus-status post Venia Minks dilation. Abnormal gastric mucosa of uncertain significance-status post biopsy  . EYE SURGERY    . INTRAOPERATIVE TRANSESOPHAGEAL ECHOCARDIOGRAM N/A 06/23/2013   Procedure: INTRAOPERATIVE TRANSESOPHAGEAL ECHOCARDIOGRAM;  Surgeon: Grace Isaac, MD;  Location: [place];  Service: Open Heart Surgery;  Laterality: N/A;  . LEFT HEART CATH AND CORONARY ANGIOGRAPHY N/A 03/14/2020   Procedure: LEFT HEART CATH AND CORONARY ANGIOGRAPHY;  Surgeon: Lorretta Harp, MD;  Location: [place];  Service: Cardiovascular;  Laterality: N/A;  . LEFT HEART CATHETERIZATION WITH CORONARY ANGIOGRAM N/A 09/08/2012   Procedure: LEFT HEART CATHETERIZATION  WITH CORONARY ANGIOGRAM;  Surgeon: [name], MD;  Location: [facility] CATH LAB;  Service: Cardiovascular;  Laterality: N/A;  . stents    . SUBXYPHOID PERICARDIAL WINDOW N/A 06/23/2013   Procedure: SUBXYPHOID PERICARDIAL WINDOW;  Surgeon: Grace Isaac, MD;  Location: [place];  Service: Thoracic;  Laterality: N/A;  . SUBXYPHOID PERICARDIAL WINDOW  10/01/2018  . SUBXYPHOID PERICARDIAL WINDOW  N/A 10/01/2018   Procedure: REDO SUBXYPHOID PERICARDIAL WINDOW;  Surgeon: Grace Isaac, MD;  Location: [place];  Service: Thoracic;  Laterality: N/A;  . TEE WITHOUT CARDIOVERSION N/A 10/01/2018   Procedure: TRANSESOPHAGEAL ECHOCARDIOGRAM (TEE);  Surgeon: Grace Isaac, MD;  Location: [facility] OR;  Service: Thoracic;  Laterality: N/A;   Social History:  reports that he has never smoked. He has never used smokeless tobacco. He reports that he does not drink alcohol and does not use drugs. Patient lives at home  Allergies  Allergen Reactions  . Hydrocodone Nausea And Vomiting  . Lisinopril Cough  . Neurontin [Gabapentin] Other (See Comments)    Reaction:  Suicidal thoughts   . Statins Other (See Comments)    Reaction:  Muscle pain   . Metformin And Related Diarrhea  . Norvasc [Amlodipine Besylate] Swelling and Other (See Comments)    Reaction:  Pedal edema    Family History  Problem Relation Age of Onset  . Diabetes Mother   . Heart attack Mother   . Stroke Mother   . Diabetes Sister   . Sleep apnea Sister   . Hypertension Brother   . Diabetes Brother   . Diabetes Brother   . Hypertension Brother   . Colon cancer Neg Hx   . Liver disease Neg Hx       Prior to Admission medications   Medication Sig Start Date End Date Taking? Authorizing Provider  acetaminophen (TYLENOL) 500 MG tablet Take 1,500 mg by mouth every 6 (six) hours as needed for mild pain, fever or headache.    Yes [provider]  allopurinol (ZYLOPRIM) 100 MG tablet Take 100 mg by mouth as needed.   Yes [provider]  ALPRAZolam Duanne Moron) 0.5 MG tablet Take 1-2 tablets (0.5-1 mg total) by mouth at bedtime as needed for sleep. 08/02/20  Yes [name], MD  aspirin 81 MG EC tablet Take 81 mg by mouth daily.   Yes [provider]  Cholecalciferol 25 MCG (1000 UT) capsule Take 1 capsule by mouth daily. 10/14/20 10/14/21 Yes [provider]  clopidogrel (PLAVIX) 75 MG tablet Take 1  tablet (75 mg total) by mouth daily. 03/28/20  Yes Kilroy, Luke K, PA-C  esomeprazole (NEXIUM) 20 MG packet Take 20 mg by mouth daily before breakfast. Does not take this regularly   Yes [provider]  Evolocumab (REPATHA SURECLICK) 952 MG/ML SOAJ Inject 1 Dose into the skin every 14 (fourteen) days. 03/30/20  Yes Hilty, Nadean Corwin, MD  glimepiride (AMARYL) 1 MG tablet TAKE 1 TABLET WITH MEALS AS DIRECTED. MAX 3 TABLETS PER DAY 09/09/20  Yes Luking, Elayne Snare, MD  isosorbide mononitrate (IMDUR) 60 MG 24 hr tablet Take 1 tablet (60 mg total) by mouth daily. 05/18/20  Yes Skeet Latch, MD  LEVEMIR FLEXTOUCH 100 UNIT/ML FlexPen INJECT 0.8 MLS (80 UNITS TOTAL) INTO THE SKIN DAILY. Patient taking differently: Inject 60 Units into the skin at bedtime. 09/27/20  Yes [name], MD  losartan (COZAAR) 25 MG tablet Take 25 mg by mouth daily.   Yes [provider]  metoprolol succinate (TOPROL-XL) 50 MG 24 hr tablet Take 1 tablet (50 mg total) by mouth daily. Take with or immediately following a meal. 03/28/20  Yes Kilroy, Doreene Burke, PA-C  nitroGLYCERIN (NITROSTAT) 0.4 MG SL tablet Place 0.4 mg under the tongue every 5 (five) minutes as needed for chest pain.   Yes [provider]  pantoprazole (PROTONIX) 40 MG tablet Take 1 tablet (40 mg total) by mouth daily. 10/13/20  Yes [name], MD  torsemide (DEMADEX) 20 MG tablet TAKE 2 TABLETS EVERY DAY 09/11/20  Yes Luking, Elayne Snare, MD  traMADol (ULTRAM) 50 MG tablet TAKE 1 TABLET (50 MG TOTAL) BY MOUTH EVERY 6 (SIX) HOURS AS NEEDED Patient taking differently: Take 50 mg by mouth every 6 (six) hours as needed for moderate pain or severe pain. 08/02/20  Yes [name], MD  Accu-Chek Softclix Lancets lancets 1 each 3 (three) times daily. 10/31/20   [provider]  colchicine 0.6 MG tablet Take 2 tablets by mouth at first sign of gout flare up then 1 twice daily PRN Patient not taking: No sig reported 09/09/20   [name], MD   DROPLET PEN NEEDLES 32G X 4 MM MISC USE EVERY DAY AS DIRECTED 05/05/20   [name], MD  ofloxacin (FLOXIN) 0.3 % OTIC solution 5 drops as needed. Patient not taking: Reported on 11/04/2020    [provider]  sucralfate (CARAFATE) 1 g tablet One 3 times a day for 2 weeks Patient not taking: No sig reported 10/13/20   [name], MD    Physical Exam: BP 138/86   Pulse 74   Temp 97.9 F (36.6 C) (Oral)   Resp (!) 23   Ht 6\' 1"  (1.854 m)   Wt 115.7 kg   SpO2 94%   BMI 33.64 kg/m   . General: Elderly male. Awake and alert and oriented x3. No acute cardiopulmonary distress.  [name] HEENT: Normocephalic atraumatic.  Right and left ears normal in appearance.  Pupils equal, round, reactive to light. Extraocular muscles are intact. Sclerae anicteric and noninjected.  Moist mucosal membranes. No mucosal lesions.  . Neck: Neck supple without lymphadenopathy. No carotid bruits. No masses palpated.  . Cardiovascular: Regular rate with normal S1-S2 sounds. No murmurs, rubs, gallops auscultated. No JVD.  [name] Respiratory: Good respiratory effort with no wheezes, rales, rhonchi. Lungs clear to auscultation bilaterally.  No accessory muscle use. . Abdomen: Soft, nontender, nondistended. Active bowel sounds. No masses or hepatosplenomegaly  . Skin: No rashes, lesions, or ulcerations.  Dry, warm to touch. 2+ dorsalis pedis and radial pulses. . Musculoskeletal: No calf or leg pain. All major joints not erythematous nontender.  No upper or lower joint deformation.  Good ROM.  No contractures  . Psychiatric: Intact judgment and insight. Pleasant and cooperative. . Neurologic: No focal neurological deficits. Strength is 5/5 and symmetric in upper and lower extremities.  Cranial nerves II through XII are grossly intact.           Labs on Admission: I have personally reviewed following labs and imaging studies  CBC: Recent Labs  Lab 11/04/20 1337  WBC 5.1  HGB 11.0*  HCT 34.4*  MCV 92.7   PLT 761   Basic Metabolic Panel: Recent Labs  Lab 11/04/20 1337  NA 136  K 4.2  CL 98  CO2 25  GLUCOSE 246*  BUN 51*  CREATININE 1.89*  CALCIUM 9.1   GFR: Estimated Creatinine Clearance: 53.3 mL/min (A) (by C-G  formula based on SCr of 1.89 mg/dL (H)). Liver Function Tests: No results for input(s): AST, ALT, ALKPHOS, BILITOT, PROT, ALBUMIN in the last 168 hours. No results for input(s): LIPASE, AMYLASE in the last 168 hours. No results for input(s): AMMONIA in the last 168 hours. Coagulation Profile: No results for input(s): INR, PROTIME in the last 168 hours. Cardiac Enzymes: No results for input(s): CKTOTAL, CKMB, CKMBINDEX, TROPONINI in the last 168 hours. BNP (last 3 results) No results for input(s): PROBNP in the last 8760 hours. HbA1C: No results for input(s): HGBA1C in the last 72 hours. CBG: No results for input(s): GLUCAP in the last 168 hours. Lipid Profile: No results for input(s): CHOL, HDL, LDLCALC, TRIG, CHOLHDL, LDLDIRECT in the last 72 hours. Thyroid Function Tests: No results for input(s): TSH, T4TOTAL, FREET4, T3FREE, THYROIDAB in the last 72 hours. Anemia Panel: No results for input(s): VITAMINB12, FOLATE, FERRITIN, TIBC, IRON, RETICCTPCT in the last 72 hours. Urine analysis:    Component Value Date/Time   COLORURINE STRAW (A) 09/20/2019 2000   APPEARANCEUR CLEAR 09/20/2019 2000   LABSPEC 1.007 09/20/2019 2000   PHURINE 7.0 09/20/2019 2000   GLUCOSEU >=500 (A) 09/20/2019 2000   HGBUR NEGATIVE 09/20/2019 2000   BILIRUBINUR small 10/28/2019 0903   KETONESUR NEGATIVE 09/20/2019 2000   PROTEINUR Positive (A) 10/28/2019 0903   PROTEINUR 100 (A) 09/20/2019 2000   UROBILINOGEN negative (A) 10/28/2019 0903   UROBILINOGEN 0.2 01/21/2015 1053   NITRITE neg 10/28/2019 0903   NITRITE NEGATIVE 09/20/2019 2000   LEUKOCYTESUR Negative 10/28/2019 0903   LEUKOCYTESUR NEGATIVE 09/20/2019 2000   Sepsis  Labs: @LABRCNTIP (procalcitonin:4,lacticidven:4) ) Recent Results (from the past 240 hour(s))  Resp Panel by RT-PCR (Flu A&B, Covid) Nasopharyngeal Swab     Status: None   Collection Time: 11/04/20  3:49 PM   Specimen: Nasopharyngeal Swab; Nasopharyngeal(NP) swabs in vial transport medium  Result Value Ref Range Status   SARS Coronavirus 2 by RT PCR NEGATIVE NEGATIVE Final    Comment: (NOTE) SARS-CoV-2 target nucleic acids are NOT DETECTED.  The SARS-CoV-2 RNA is generally detectable in upper respiratory specimens during the acute phase of infection. The lowest concentration of SARS-CoV-2 viral copies this assay can detect is 138 copies/mL. A negative result does not preclude SARS-Cov-2 infection and should not be used as the sole basis for treatment or other patient management decisions. A negative result may occur with  improper specimen collection/handling, submission of specimen other than nasopharyngeal swab, presence of viral mutation(s) within the areas targeted by this assay, and inadequate number of viral copies(<138 copies/mL). A negative result must be combined with clinical observations, patient history, and epidemiological information. The expected result is Negative.  Fact Sheet for Patients:  EntrepreneurPulse.com.au  Fact Sheet for Healthcare Providers:  IncredibleEmployment.be  This test is no t yet approved or cleared by the [place] FDA and  has been authorized for detection and/or diagnosis of SARS-CoV-2 by FDA under an Emergency Use Authorization (EUA). This EUA will remain  in effect (meaning this test can be used) for the duration of the COVID-19 declaration under Section 564(b)(1) of the Act, 21 U.S.C.section 360bbb-3(b)(1), unless the authorization is terminated  or revoked sooner.       Influenza A by PCR NEGATIVE NEGATIVE Final   Influenza B by PCR NEGATIVE NEGATIVE Final    Comment: (NOTE) The Xpert Xpress  SARS-CoV-2/FLU/RSV plus assay is intended as an aid in the diagnosis of influenza from Nasopharyngeal swab specimens and should not be used as a  sole basis for treatment. Nasal washings and aspirates are unacceptable for Xpert Xpress SARS-CoV-2/FLU/RSV testing.  Fact Sheet for Patients: EntrepreneurPulse.com.au  Fact Sheet for Healthcare Providers: IncredibleEmployment.be  This test is not yet approved or cleared by the [place] FDA and has been authorized for detection and/or diagnosis of SARS-CoV-2 by FDA under an Emergency Use Authorization (EUA). This EUA will remain in effect (meaning this test can be used) for the duration of the COVID-19 declaration under Section 564(b)(1) of the Act, 21 U.S.C. section 360bbb-3(b)(1), unless the authorization is terminated or revoked.  Performed at [facility], [address], [place], [place] [address]      Radiological Exams on Admission: DG Chest 2 View  Result Date: 11/04/2020 CLINICAL DATA:  Chest pain and shortness of breath. EXAM: CHEST - 2 VIEW COMPARISON:  05/30/2020 and CT chest 09/11/2018. FINDINGS: Trachea is midline. Heart is enlarged. Mild basilar streaky opacities appear fairly chronic, as on 09/11/2018. No airspace consolidation or pleural fluid. IMPRESSION: 1. No acute findings. 2. Bibasilar scarring. Electronically Signed   By: Lorin Picket M.D.   On: 11/04/2020 14:14   DG Knee Complete 4 Views Left  Result Date: 11/04/2020 CLINICAL DATA:  Anterolateral left knee pain for 2 weeks, no injury. EXAM: LEFT KNEE - COMPLETE 4+ VIEW COMPARISON:  None. FINDINGS: No acute osseous or joint abnormality. No degenerative changes. Vascular calcifications. IMPRESSION: No findings to explain the patient's pain. Electronically Signed   By: Lorin Picket M.D.   On: 11/04/2020 14:11   ECHOCARDIOGRAM COMPLETE  Result Date: 11/04/2020    ECHOCARDIOGRAM REPORT   Patient Name:   Eugene [name] [facility]  Date of Exam: 11/04/2020 Medical Rec #:  [id number]           Height:       73.0 in Accession #:    [id number]          Weight:       255.0 lb Date of Birth:  March 26, 1956           BSA:          2.386 m Patient Age:    17 years            BP:           149/84 mmHg Patient Gender: M                   HR:           76 bpm. Exam Location:  [facility] Procedure: 2D Echo Indications:    Congestive Heart Failure I50.9  History:        Patient has prior history of Echocardiogram examinations, most                 recent 03/14/2020. CHF, Previous Myocardial Infarction and CAD,                 Signs/Symptoms:Chest Pain; Risk Factors:Dyslipidemia,                 Hypertension, Non-Smoker and Diabetes.  Sonographer:    Leavy Cella RDCS (AE) Referring Phys: [phone] [place]  1. Left ventricular ejection fraction, by estimation, is 35 to 40%. The left ventricle has moderately decreased function. The left ventricle demonstrates regional wall motion abnormalities (see scoring diagram/findings for description) consistent with ischemic cardiomyopathy. There is moderate left ventricular hypertrophy. Left ventricular diastolic parameters are indeterminate. No formed LV mural thrombus noted with Definity contrast.  2. Right ventricular systolic  function is normal. The right ventricular size is normal. Tricuspid regurgitation signal is inadequate for assessing PA pressure.  3. Large pericardial effusion. The pericardial effusion is posterior to the left ventricle, lateral to the left ventricle and localized near the right atrium. There is no clear evidence of cardiac tamponade. Overall similar in size to prior study in July 2021.  4. The mitral valve is abnormal. Trivial mitral valve regurgitation. Moderate mitral annular calcification.  5. The aortic valve is tricuspid. Aortic valve regurgitation is not visualized.  6. The inferior vena cava is normal in size with greater than 50% respiratory variability, suggesting  right atrial pressure of 3 mmHg. FINDINGS  Left Ventricle: Left ventricular ejection fraction, by estimation, is 35 to 40%. The left ventricle has moderately decreased function. The left ventricle demonstrates regional wall motion abnormalities. Definity contrast agent was given IV to delineate the left ventricular endocardial borders. The left ventricular internal cavity size was normal in size. There is moderate left ventricular hypertrophy. Left ventricular diastolic parameters are indeterminate.  LV Wall Scoring: The mid and distal anterior septum and entire apex are akinetic. The mid inferolateral segment, mid inferoseptal segment, mid anterior segment, and mid inferior segment are hypokinetic. The antero-lateral wall, basal anteroseptal segment, basal inferolateral segment, basal anterior segment, basal inferior segment, and basal inferoseptal segment are normal. Right Ventricle: The right ventricular size is normal. No increase in right ventricular wall thickness. Right ventricular systolic function is normal. Tricuspid regurgitation signal is inadequate for assessing PA pressure. Left Atrium: Left atrial size was normal in size. Right Atrium: Right atrial size was normal in size. Pericardium: A large pericardial effusion is present. The pericardial effusion is posterior to the left ventricle, lateral to the left ventricle and localized near the right atrium. There is no evidence of cardiac tamponade. Mitral Valve: The mitral valve is abnormal. There is mild calcification of the mitral valve leaflet(s). Moderate mitral annular calcification. Trivial mitral valve regurgitation. Tricuspid Valve: The tricuspid valve is grossly normal. Tricuspid valve regurgitation is trivial. Aortic Valve: The aortic valve is tricuspid. There is mild aortic valve annular calcification. Aortic valve regurgitation is not visualized. Pulmonic Valve: The pulmonic valve was grossly normal. Pulmonic valve regurgitation is trivial.  Aorta: The aortic root is normal in size and structure. Venous: The inferior vena cava is normal in size with greater than 50% respiratory variability, suggesting right atrial pressure of 3 mmHg. IAS/Shunts: No atrial level shunt detected by color flow Doppler.  LEFT VENTRICLE PLAX 2D LVIDd:         4.07 cm  Diastology LVIDs:         3.09 cm  LV e' medial:    4.26 cm/s LV PW:         1.74 cm  LV E/e' medial:  26.3 LV IVS:        1.43 cm  LV e' lateral:   6.18 cm/s LVOT diam:     2.00 cm  LV E/e' lateral: 18.1 LVOT Area:     3.14 cm  RIGHT VENTRICLE RV S prime:     9.79 cm/s TAPSE (M-mode): 2.1 cm LEFT ATRIUM              Index       RIGHT ATRIUM           Index LA diam:        5.00 cm  2.10 cm/m  RA Area:     15.30 cm LA Vol (A2C):   103.0  ml 43.17 ml/m RA Volume:   40.20 ml  16.85 ml/m LA Vol (A4C):   61.9 ml  25.95 ml/m LA Biplane Vol: 86.1 ml  36.09 ml/m   AORTA Ao Root diam: 3.90 cm MITRAL VALVE MV Area (PHT): 5.42 cm     SHUNTS MV Decel Time: 140 msec     Systemic Diam: 2.00 cm MV E velocity: 112.00 cm/s MV A velocity: 41.10 cm/s MV E/A ratio:  2.73 Rozann Lesches MD Electronically signed by Rozann Lesches MD Signature Date/Time: 11/04/2020/5:21:59 PM    Final     EKG: Independently reviewed.  Sinus rhythm with incomplete bundle branch block..  Low voltage throughout no changes from prior  Assessment/Plan: Principal Problem:   Unstable angina (HCC) Active Problems:   Essential hypertension   CAD S/P percutaneous coronary angioplasty   Chronic diastolic CHF (congestive heart failure) (HCC)   Chest pain   Right carotid bruit   Diabetes mellitus due to underlying condition, uncontrolled, with hyperglycemia, with long-term current use of insulin (HCC)   Chronic renal disease, stage III ([place])    This patient was discussed with the ED physician, including pertinent vitals, physical exam findings, labs, and imaging.  We also discussed care given by the ED provider.  1. Unstable  angina a. Observation b. Trend troponins c. Echocardiogram pending d. EKG in the morning 2. Hypertension with coronary artery disease and chronic diastolic heart failure a. We will controlled blood pressure b. Continue Plavix, Imdur, losartan, metoprolol 3. Chronic renal disease stage III a. Stable 4. Diabetes on insulin a. Continue home regimen with sliding scale and CBGs before meals and nightly  DVT prophylaxis: Lovenox Consultants: Cardiology Code Status: Full code Family Communication: None Disposition Plan: Patient should be able to return home following admission   Truett Mainland, DO

## 2020-11-04 NOTE — Progress Notes (Signed)
Report received from ED RN Blima Ledger for department 300 admission.  Pt being transported to 329 in stable condition.

## 2020-11-04 NOTE — Progress Notes (Signed)
*  PRELIMINARY RESULTS* Echocardiogram 2D Echocardiogram with definity has been performed.  Eugene [name] 11/04/2020, 4:50 PM

## 2020-11-04 NOTE — ED Provider Notes (Signed)
[facility] Provider Note   CSN: [id number] Arrival date & time: 11/04/20  1307     History Chief Complaint  Patient presents with  . Chest Pain    Eugene [name] is a 65 y.o. male.  Pt presents to the ED today with CP.  The pt said cp started yesterday.  He took some nito and the pain went away, but it came back.  The patient does have a significant hx of CAD.  He last had a cath in July of 2021 which showed diffuse CAD.  Treatment was for medical management.  Pt said he feels sob, but he does not feel like it is worse than normal.  Pt did take ASA this am.  He did not take any nitro.  No n/v.         Past Medical History:  Diagnosis Date  . Anemia   . Anxiety   . Anxiety   . Arteriosclerotic cardiovascular disease (ASCVD)    a. 05/2011 s/p DES to PDA and RCA. b. 08/2012 Inflat  STEMI/Cath/PCI: LM minor irregs, LAD 50p, D1 50, LCX nl, OM1 25, RCA 30-40p, 100d (treated with 2.75x41mm Promus Premier DES);    [name] Asthma   . Bell palsy   . C. difficile colitis    a. 08/2012  . Cholelithiasis 07/2012   Asymptomatic; identified incidentally  . Chronic diastolic CHF (congestive heart failure) ([place])   . CKD (chronic kidney disease), stage III ([place])   . Contrast dye induced nephropathy    a. 08/2012 post cath/pci  . COPD (chronic obstructive pulmonary disease) ([place])   . Diabetes mellitus    Peripheral neuropathy  . Gallstones   . GERD (gastroesophageal reflux disease)   . Gout   . Hyperlipidemia   . Hypertension   . Myocardial infarct ([place]) 09/08/12  . Nephrolithiasis   . Old myocardial infarction   . Pericardial effusion    a. s/p window in 2014. b. s/p redo window in 09/2018.  [name] Peripheral neuropathy   . PONV (postoperative nausea and vomiting)   . Sleep apnea   . Statin intolerance     Patient Active Problem List   Diagnosis Date Noted  . Left knee pain 11/03/2020  . Chronic anxiety 05/03/2020  . Chronic renal disease, stage III ([place])  03/28/2020  . Chronic respiratory failure ([place]) 03/18/2020  . Unstable angina ([place]) 03/12/2020  . NSTEMI (non-ST elevated myocardial infarction) ([place]) 03/12/2020  . Kidney stones 10/28/2019  . Diabetes mellitus due to underlying condition, uncontrolled, with hyperglycemia, with long-term current use of insulin ([place]) 02/24/2019  . Acute exacerbation of CHF (congestive heart failure) /dCHF 02/22/2019  . S/P pericardial window creation 10/01/2018  . DM type 2 causing CKD stage 3 ([place]) 11/14/2016  . Acute on chronic systolic congestive heart failure ([place])   . Cardiomyopathy ([place])   . Acute on chronic diastolic CHF (congestive heart failure) ([place]) 11/10/2016  . Elevated troponin   . Coronary artery disease involving native coronary artery of native heart without angina pectoris   . Moderate nonproliferative diabetic retinopathy of both eyes without macular edema associated with type 2 diabetes mellitus ([place]) 10/22/2016  . Hyperkalemia 06/14/2016  . Morbid obesity ([place])   . Diabetes mellitus type 2, uncontrolled, with complications ([place]) 46/56/8127  . Right carotid bruit 11/10/2015  . Severe hypertension 11/09/2015  . Hypertensive urgency 11/09/2015  . Gout of wrist 08/08/2015  . Pericardial effusion without cardiac tamponade 05/26/2015  . HLD (hyperlipidemia) 05/26/2015  .  Anxiety 05/26/2015  . Olecranon bursitis   . Inflammatory arthritis   . Elbow joint pain 01/21/2015  . SOB (shortness of breath) on exertion 12/26/2014  . Constipation 10/26/2014  . Peripheral edema 01/29/2014  . Acute on chronic diastolic heart failure ([place]) 01/26/2014  . Chest pain 12/13/2013  . SOB (shortness of breath) 10/16/2013  . Chronic diastolic CHF (congestive heart failure) ([place]) 10/15/2013  . NSVT (nonsustained ventricular tachycardia) ([place]) 06/26/2013  . Type 2 diabetes mellitus with neurological manifestations ([place]) 06/26/2013  . Acute diastolic CHF (congestive heart failure) ([place]) 06/26/2013  .  Recurrent Pericardial effusion/pericardial window on 06/23/2013 and 10/01/2018 06/26/2013  . Old myocardial infarction   . Gastroparesis due to DM ([place]) 05/21/2013  . CAD (coronary artery disease) 09/16/2012  . Acute on chronic renal insufficiency 09/14/2012  . Edema 08/18/2012  . Anemia, normocytic normochromic 07/31/2012  . Cholelithiasis 07/27/2012  . GERD (gastroesophageal reflux disease) 05/10/2012  . Noncompliance 05/09/2012  . Peripheral neuropathy 10/24/2011  . Obstructive sleep apnea- on C-pap 08/23/2011  . CAD S/P percutaneous coronary angioplasty   . Hyperlipidemia   . Cholelithiasis 05/28/2011  . Essential hypertension 05/25/2011    Past Surgical History:  Procedure Laterality Date  . CARDIAC CATHETERIZATION    . CHEST TUBE INSERTION    . CIRCUMCISION    . COLONOSCOPY     In [facility], approximately 2011 per patient, was normal. Advised to come back in 10 years.  . ESOPHAGOGASTRODUODENOSCOPY     in [place] over 20 yrs ago  . ESOPHAGOGASTRODUODENOSCOPY  06/12/2012   UXL:KGMWNU esophagus-status post Venia Minks dilation. Abnormal gastric mucosa of uncertain significance-status post biopsy  . EYE SURGERY    . INTRAOPERATIVE TRANSESOPHAGEAL ECHOCARDIOGRAM N/A 06/23/2013   Procedure: INTRAOPERATIVE TRANSESOPHAGEAL ECHOCARDIOGRAM;  Surgeon: Grace Isaac, MD;  Location: [facility];  Service: Open Heart Surgery;  Laterality: N/A;  . LEFT HEART CATH AND CORONARY ANGIOGRAPHY N/A 03/14/2020   Procedure: LEFT HEART CATH AND CORONARY ANGIOGRAPHY;  Surgeon: Lorretta Harp, MD;  Location: [facility];  Service: Cardiovascular;  Laterality: N/A;  . LEFT HEART CATHETERIZATION WITH CORONARY ANGIOGRAM N/A 09/08/2012   Procedure: LEFT HEART CATHETERIZATION WITH CORONARY ANGIOGRAM;  Surgeon: [name], MD;  Location[facility];  Service: Cardiovascular;  Laterality: N/A;  . stents    . SUBXYPHOID PERICARDIAL WINDOW N/A 06/23/2013   Procedure: SUBXYPHOID  PERICARDIAL WINDOW;  Surgeon: Grace Isaac, MD;  Location: [facility];  Service: Thoracic;  Laterality: N/A;  . SUBXYPHOID PERICARDIAL WINDOW  10/01/2018  . SUBXYPHOID PERICARDIAL WINDOW N/A 10/01/2018   Procedure: REDO SUBXYPHOID PERICARDIAL WINDOW;  Surgeon: Grace Isaac, MD;  Location: [facility];  Service: Thoracic;  Laterality: N/A;  . TEE WITHOUT CARDIOVERSION N/A 10/01/2018   Procedure: TRANSESOPHAGEAL ECHOCARDIOGRAM (TEE);  Surgeon: Grace Isaac, MD;  Location: [facility] OR;  Service: Thoracic;  Laterality: N/A;       Family History  Problem Relation Age of Onset  . Diabetes Mother   . Heart attack Mother   . Stroke Mother   . Diabetes Sister   . Sleep apnea Sister   . Hypertension Brother   . Diabetes Brother   . Diabetes Brother   . Hypertension Brother   . Colon cancer Neg Hx   . Liver disease Neg Hx     Social History   Tobacco Use  . Smoking status: Never Smoker  . Smokeless tobacco: Never Used  Vaping Use  . Vaping Use: Never used  Substance Use  Topics  . Alcohol use: No    Alcohol/week: 0.0 standard drinks    Comment: heavy etoh use 30 years ago  . Drug use: No    Home Medications Prior to Admission medications   Medication Sig Start Date End Date Taking? Authorizing Provider  acetaminophen (TYLENOL) 500 MG tablet Take 1,500 mg by mouth every 6 (six) hours as needed for mild pain, fever or headache.     [provider]  allopurinol (ZYLOPRIM) 100 MG tablet Take 100 mg by mouth as needed.    [provider]  ALPRAZolam Duanne Moron) 0.5 MG tablet Take 1-2 tablets (0.5-1 mg total) by mouth at bedtime as needed for sleep. 08/02/20   [name], MD  aspirin 81 MG EC tablet Take 81 mg by mouth daily.    [provider]  Cholecalciferol 25 MCG (1000 UT) capsule Take 1 capsule by mouth daily. 10/14/20 10/14/21  [provider]  clopidogrel (PLAVIX) 75 MG tablet Take 1 tablet (75 mg total) by mouth daily. 03/28/20   Erlene Quan, PA-C   colchicine 0.6 MG tablet Take 2 tablets by mouth at first sign of gout flare up then 1 twice daily PRN 09/09/20   [name], MD  DROPLET PEN NEEDLES 32G X 4 MM MISC USE EVERY DAY AS DIRECTED 05/05/20   Luking, Elayne Snare, MD  Evolocumab (REPATHA SURECLICK) 643 MG/ML SOAJ Inject 1 Dose into the skin every 14 (fourteen) days. 03/30/20   Hilty, Nadean Corwin, MD  glimepiride (AMARYL) 1 MG tablet TAKE 1 TABLET WITH MEALS AS DIRECTED. MAX 3 TABLETS PER DAY 09/09/20   [name], MD  isosorbide mononitrate (IMDUR) 60 MG 24 hr tablet Take 1 tablet (60 mg total) by mouth daily. 05/18/20   Skeet Latch, MD  LEVEMIR FLEXTOUCH 100 UNIT/ML FlexPen INJECT 0.8 MLS (80 UNITS TOTAL) INTO THE SKIN DAILY. 09/27/20   [name], MD  losartan (COZAAR) 25 MG tablet Take 25 mg by mouth daily.    [provider]  metoprolol succinate (TOPROL-XL) 50 MG 24 hr tablet Take 1 tablet (50 mg total) by mouth daily. Take with or immediately following a meal. 03/28/20   Kilroy, Doreene Burke, PA-C  nitroGLYCERIN (NITROSTAT) 0.4 MG SL tablet Place 0.4 mg under the tongue every 5 (five) minutes as needed for chest pain.    [provider]  ofloxacin (FLOXIN) 0.3 % OTIC solution 5 drops as needed.    [provider]  pantoprazole (PROTONIX) 40 MG tablet Take 1 tablet (40 mg total) by mouth daily. 10/13/20   [name], MD  sucralfate (CARAFATE) 1 g tablet One 3 times a day for 2 weeks 10/13/20   [name], MD  torsemide (DEMADEX) 20 MG tablet TAKE 2 TABLETS EVERY DAY 09/11/20   [name], MD  traMADol (ULTRAM) 50 MG tablet TAKE 1 TABLET (50 MG TOTAL) BY MOUTH EVERY 6 (SIX) HOURS AS NEEDED Patient taking differently: Take 50 mg by mouth every 6 (six) hours as needed for moderate pain or severe pain. 08/02/20   [name], MD    Allergies    Hydrocodone, Lisinopril, Neurontin [gabapentin], Statins, Metformin and related, and Norvasc [amlodipine besylate]  Review of Systems   Review of  Systems  Cardiovascular: Positive for chest pain.  All other systems reviewed and are negative.   Physical Exam Updated Vital Signs BP (!) 148/83   Pulse 81   Temp 97.9 F (36.6 C) (Oral)  Resp 17   Ht 6\' 1"  (1.854 m)   Wt 115.7 kg   SpO2 95%   BMI 33.64 kg/m   Physical Exam Vitals and nursing note reviewed.  Constitutional:      Appearance: He is well-developed. He is obese.  HENT:     Head: Normocephalic and atraumatic.  Eyes:     Extraocular Movements: Extraocular movements intact.     Pupils: Pupils are equal, round, and reactive to light.  Cardiovascular:     Rate and Rhythm: Normal rate and regular rhythm.     Heart sounds: Normal heart sounds.  Pulmonary:     Effort: Pulmonary effort is normal.     Breath sounds: Normal breath sounds.  Abdominal:     General: Bowel sounds are normal.     Palpations: Abdomen is soft.  Musculoskeletal:        General: Normal range of motion.     Cervical back: Normal range of motion and neck supple.     Right lower leg: Edema present.     Left lower leg: Edema present.  Skin:    General: Skin is warm.     Capillary Refill: Capillary refill takes less than 2 seconds.  Neurological:     General: No focal deficit present.     Mental Status: He is alert and oriented to person, place, and time.  Psychiatric:        Mood and Affect: Mood normal.        Behavior: Behavior normal.     ED Results / Procedures / Treatments   Labs (all labs ordered are listed, but only abnormal results are displayed) Labs Reviewed  BASIC METABOLIC PANEL - Abnormal; Notable for the following components:      Result Value   Glucose, Bld 246 (*)    BUN 51 (*)    Creatinine, Ser 1.89 (*)    GFR, Estimated 39 (*)    All other components within normal limits  CBC - Abnormal; Notable for the following components:   RBC 3.71 (*)    Hemoglobin 11.0 (*)    HCT 34.4 (*)    RDW 16.4 (*)    All other components within normal limits  TROPONIN I  (HIGH SENSITIVITY) - Abnormal; Notable for the following components:   Troponin I (High Sensitivity) 207 (*)    All other components within normal limits  RESP PANEL BY RT-PCR (FLU A&B, COVID) ARPGX2  TROPONIN I (HIGH SENSITIVITY)    EKG EKG Interpretation  Date/Time:  Friday November 04 2020 13:23:18 EST Ventricular Rate:  82 PR Interval:  226 QRS Duration: 102 QT Interval:  394 QTC Calculation: 460 R Axis:   100 Text Interpretation: Sinus rhythm with 1st degree A-V block Rightward axis Low voltage QRS Incomplete right bundle branch block Cannot rule out Anteroseptal infarct , age undetermined Abnormal ECG No significant change since last tracing Confirmed by Isla Pence [phone]) on 11/04/2020 3:09:13 PM   Radiology DG Chest 2 View  Result Date: 11/04/2020 CLINICAL DATA:  Chest pain and shortness of breath. EXAM: CHEST - 2 VIEW COMPARISON:  05/30/2020 and CT chest 09/11/2018. FINDINGS: Trachea is midline. Heart is enlarged. Mild basilar streaky opacities appear fairly chronic, as on 09/11/2018. No airspace consolidation or pleural fluid. IMPRESSION: 1. No acute findings. 2. Bibasilar scarring. Electronically Signed   By: Lorin Picket M.D.   On: 11/04/2020 14:14   DG Knee Complete 4 Views Left  Result Date: 11/04/2020 CLINICAL DATA:  Anterolateral left  knee pain for 2 weeks, no injury. EXAM: LEFT KNEE - COMPLETE 4+ VIEW COMPARISON:  None. FINDINGS: No acute osseous or joint abnormality. No degenerative changes. Vascular calcifications. IMPRESSION: No findings to explain the patient's pain. Electronically Signed   By: Lorin Picket M.D.   On: 11/04/2020 14:11    Procedures Procedures   Medications Ordered in ED Medications  nitroGLYCERIN (NITROSTAT) SL tablet 0.4 mg (0.4 mg Sublingual Given 11/04/20 1545)    ED Course  I have reviewed the triage vital signs and the nursing notes.  Pertinent labs & imaging results that were available during my care of the patient were  reviewed by me and considered in my medical decision making (see chart for details).    MDM Rules/Calculators/A&P                          Pt d/w Dr. Domenic Polite (cards) who recommends an ECHO and keeping him here for now.  He will see pt in consult.  Pt d/w Dr. Nehemiah Settle (triad) for admission.   Final Clinical Impression(s) / ED Diagnoses Final diagnoses:  NSTEMI (non-ST elevated myocardial infarction) [facility])    Rx / [place] Orders ED Discharge Orders    None       Isla Pence, MD 11/04/20 1550

## 2020-11-04 NOTE — ED Triage Notes (Signed)
C/o chest pain onset yesterday

## 2020-11-05 DIAGNOSIS — I130 Hypertensive heart and chronic kidney disease with heart failure and stage 1 through stage 4 chronic kidney disease, or unspecified chronic kidney disease: Secondary | ICD-10-CM | POA: Diagnosis not present

## 2020-11-05 DIAGNOSIS — N183 Chronic kidney disease, stage 3 unspecified: Secondary | ICD-10-CM | POA: Diagnosis not present

## 2020-11-05 DIAGNOSIS — I200 Unstable angina: Secondary | ICD-10-CM | POA: Diagnosis not present

## 2020-11-05 DIAGNOSIS — Z7982 Long term (current) use of aspirin: Secondary | ICD-10-CM | POA: Diagnosis not present

## 2020-11-05 DIAGNOSIS — J449 Chronic obstructive pulmonary disease, unspecified: Secondary | ICD-10-CM | POA: Diagnosis not present

## 2020-11-05 DIAGNOSIS — E1122 Type 2 diabetes mellitus with diabetic chronic kidney disease: Secondary | ICD-10-CM | POA: Diagnosis not present

## 2020-11-05 DIAGNOSIS — J45909 Unspecified asthma, uncomplicated: Secondary | ICD-10-CM | POA: Diagnosis not present

## 2020-11-05 DIAGNOSIS — I5043 Acute on chronic combined systolic (congestive) and diastolic (congestive) heart failure: Secondary | ICD-10-CM | POA: Diagnosis not present

## 2020-11-05 DIAGNOSIS — Z20822 Contact with and (suspected) exposure to covid-19: Secondary | ICD-10-CM | POA: Diagnosis not present

## 2020-11-05 DIAGNOSIS — I214 Non-ST elevation (NSTEMI) myocardial infarction: Secondary | ICD-10-CM | POA: Diagnosis not present

## 2020-11-05 LAB — HEMOGLOBIN A1C
Hgb A1c MFr Bld: 8 % — ABNORMAL HIGH (ref 4.8–5.6)
Mean Plasma Glucose: 182.9 mg/dL

## 2020-11-05 LAB — GLUCOSE, CAPILLARY
Glucose-Capillary: 118 mg/dL — ABNORMAL HIGH (ref 70–99)
Glucose-Capillary: 162 mg/dL — ABNORMAL HIGH (ref 70–99)
Glucose-Capillary: 133 mg/dL — ABNORMAL HIGH (ref 70–99)
Glucose-Capillary: 159 mg/dL — ABNORMAL HIGH (ref 70–99)

## 2020-11-05 LAB — TROPONIN I (HIGH SENSITIVITY): Troponin I (High Sensitivity): 175 ng/L (ref <18)

## 2020-11-05 MED ORDER — FUROSEMIDE 10 MG/ML IJ SOLN
40.0000 mg | Freq: Every day | INTRAMUSCULAR | Status: DC
  Administered 2020-11-05 – 2020-11-06 (×2): 40 mg via INTRAVENOUS
  Filled 2020-11-05 (×2): qty 4

## 2020-11-05 MED ORDER — ALBUTEROL SULFATE (2.5 MG/3ML) 0.083% IN NEBU: 2.5000 mg | INHALATION_SOLUTION | RESPIRATORY_TRACT | Status: DC | PRN

## 2020-11-05 NOTE — Progress Notes (Signed)
Patient Demographics:    Eugene [name], is a 65 y.o. male, DOB - April 03, 1956, [id number]  Admit date - 11/04/2020   Admitting Physician Truett Mainland, DO  Outpatient Primary MD for the patient is [name], MD  LOS - 0   Chief Complaint  Patient presents with  . Chest Pain        Subjective:    Karle Starch Miralles today has no fevers, no emesis,   -No further chest pains at this time -Patient does have some dyspnea on exertion  Assessment  & Plan :    Principal Problem:   Unstable angina (HCC) Active Problems:   CAD S/P percutaneous coronary angioplasty   Essential hypertension   Chronic diastolic CHF (congestive heart failure) (HCC)   Chest pain   Right carotid bruit   Diabetes mellitus due to underlying condition, uncontrolled, with hyperglycemia, with long-term current use of insulin (HCC)   Chronic renal disease, stage III ([place])  Brief Summary:- Eugene [name] is a 65 y.o. male with a history of CAD s/p PCI (DES to the RCA in 2012 and 2014 ), chronic grade 3 diastolic heart failure (restrictive), recurrent pericardial effusion s/p pericardial window in 2014 and Feb 2020 with residual moderate pericardial effusion without tamponade, COPD / emphysema HTN, diabetes, CKD III3B, and OSA admitted on 11/04/2020 with chest discomfort and possible NSTEMI   LHC 03/14/20:   Previously placed Mid RCA to Dist RCA stent (unknown type) is widely patent.  Prox Cx lesion is 50% stenosed.  Mid RCA lesion is 40% stenosed.  Mid LAD-1 lesion is 50% stenosed.  Mid LAD-2 lesion is 100% stenosed with 100% stenosed side branch in 2nd Sept.  Mid LAD-3 lesion is 100% stenosed.  IMPRESSION: As per Dr Gwenlyn Found --[name] has a total LAD occlusion/with extensive disease in the midportion without collaterals. Given the fact that there were no collaterals I suspect that he has no viable  myocardium beyond his occluded mid LAD and therefore no benefit would be derived from opening this diffusely diseased vessel. 2D echo is pending. Recommend medical therapy. If he has ongoing pain and an akinetic anterior wall we could consider doing a viability cardiac MRI. Depending on his LV function, if his EF is less than 35% he may benefit from a LifeVest.  A/p 1)Recurrent Chest Pain-possible NSTEMI  H/o extensive CAD, s/p PCI (DES to the RCA in 2012 and 2014 )-- LHC from 03/14/2020 as noted above with total LAD occlusion without significant collaterals, medical management advised as patient is not likely to benefit would be derived from opening this diffusely diseased vessel. -Troponin--- 207>>198>>199>>175 -Cardiology consult from Dr. Domenic Polite appreciated -Continue Imdur,  aspirin and clopidogrel , Toprol-XL and losartan - He is intolerant of statins, currently receiving Repatha twice monthly -LDL was 47 in August 2021 -As per cardiologist hold off on repeat LHC as long as patient is chest pain-free and EKG and troponins do not indicate worsening ischemia -Patient has CKD 3B with risk for contrast nephropathy if LHC is performed --No further chest pains at this time -Patient does have some dyspnea on exertion   2)HFrEF--# Acute on chronic systolic and diastolic heart failure: -Patient with ischemic cardiomyopathy/systolic dysfunction as well as grade 3 diastolic dysfunction  CHF (restrictive) -Repeat echo with EF down to 35 to 40% -give IV Lasix -Continue losartan  3)Recurrent pericardial effusions: -Repeat echo with persistent pericardial effusion without tamponade.  Previously had 2 pericardial windows, last time in 2020.    He does not have any symptoms of pericarditis.    4)DM2- -  A1c 8.0 reflecting uncontrolled diabetes with hyperglycemia PTA - Levemir 60 units nightly -Use Novolog/Humalog Sliding scale insulin with Accu-Cheks/Fingersticks as ordered   5)CKD  3B--creatinine is 1.89 which is close to patient's usual baseline - renally adjust medications, avoid nephrotoxic agents / dehydration  / hypotension -Monitor renal function closely while on losartan and torsemide  6)GERD--Protonix 40 mg daily  7)COPD--- patient is a reformed smoker with emphysema, no acute flareup, continue as needed bronchodilators  8) class II obesity- -Low calorie diet, portion control and increase physical activity discussed with patient -Body mass index is 33.51 kg/m.  Disposition/Need for in-Hospital Stay- patient unable to be discharged at this time due to NSTEMI--- requiring further monitoring, CHF requiring IV Lasix for diuresis   Disposition: The patient is from: Home              Anticipated d/c is to: Home              Anticipated d/c date is: 1 day              Patient currently is not medically stable to d/c. Barriers: Not Clinically Stable-   Code Status :  -  Code Status: Full Code   Family Communication:    NA (patient is alert, awake and coherent)   Consults  :  cardiology  DVT Prophylaxis  :   - SCDs   enoxaparin (LOVENOX) injection 40 mg Start: 11/04/20 2000    Lab Results  Component Value Date   PLT 174 11/04/2020    Inpatient Medications  Scheduled Meds: . aspirin EC  81 mg Oral Daily  . clopidogrel  75 mg Oral Daily  . enoxaparin (LOVENOX) injection  40 mg Subcutaneous Q24H  . furosemide  40 mg Intravenous Daily  . insulin aspart  0-15 Units Subcutaneous TID WC  . insulin aspart  0-5 Units Subcutaneous QHS  . insulin detemir  60 Units Subcutaneous QHS  . isosorbide mononitrate  60 mg Oral Daily  . losartan  25 mg Oral Daily  . metoprolol succinate  50 mg Oral Daily  . pantoprazole  40 mg Oral Daily   Continuous Infusions: PRN Meds:.acetaminophen, albuterol, allopurinol, ALPRAZolam, nitroGLYCERIN, ondansetron (ZOFRAN) IV    Anti-infectives (From admission, onward)   None        Objective:   Vitals:   11/04/20  2200 11/05/20 0400 11/05/20 0838 11/05/20 1445  BP:  (!) 103/55 125/72 126/76  Pulse:  63 72 76  Resp:    18  Temp:  98.5 F (36.9 C)  98.6 F (37 C)  TempSrc:  Oral  Oral  SpO2:  93% 97% 97%  Weight: 115.2 kg     Height: 6\' 1"  (1.854 m)       Wt Readings from Last 3 Encounters:  11/04/20 115.2 kg  11/03/20 115.7 kg  10/17/20 118.3 kg     Intake/Output Summary (Last 24 hours) at 11/05/2020 1517 Last data filed at 11/05/2020 0900 Gross per 24 hour  Intake 480 ml  Output --  Net 480 ml     Physical Exam  Gen:- Awake Alert,  In no apparent distress  HEENT:- [place].AT, No sclera icterus  Neck-Supple Neck,  Lungs-diminished breath sounds, no wheezing CV- S1, S2 normal, regular  Abd-  +ve B.Sounds, Abd Soft, No tenderness,    Extremity/Skin:-1+ pitting edema, pedal pulses present  Psych-affect is appropriate, oriented x3 Neuro-no new focal deficits, no tremors   Data Review:   Micro Results Recent Results (from the past 240 hour(s))  Resp Panel by RT-PCR (Flu A&B, Covid) Nasopharyngeal Swab     Status: None   Collection Time: 11/04/20  3:49 PM   Specimen: Nasopharyngeal Swab; Nasopharyngeal(NP) swabs in vial transport medium  Result Value Ref Range Status   SARS Coronavirus 2 by RT PCR NEGATIVE NEGATIVE Final    Comment: (NOTE) SARS-CoV-2 target nucleic acids are NOT DETECTED.  The SARS-CoV-2 RNA is generally detectable in upper respiratory specimens during the acute phase of infection. The lowest concentration of SARS-CoV-2 viral copies this assay can detect is 138 copies/mL. A negative result does not preclude SARS-Cov-2 infection and should not be used as the sole basis for treatment or other patient management decisions. A negative result may occur with  improper specimen collection/handling, submission of specimen other than nasopharyngeal swab, presence of viral mutation(s) within the areas targeted by this assay, and inadequate number of viral copies(<138  copies/mL). A negative result must be combined with clinical observations, patient history, and epidemiological information. The expected result is Negative.  Fact Sheet for Patients:  EntrepreneurPulse.com.au  Fact Sheet for Healthcare Providers:  IncredibleEmployment.be  This test is no t yet approved or cleared by the [place] FDA and  has been authorized for detection and/or diagnosis of SARS-CoV-2 by FDA under an Emergency Use Authorization (EUA). This EUA will remain  in effect (meaning this test can be used) for the duration of the COVID-19 declaration under Section 564(b)(1) of the Act, 21 U.S.C.section 360bbb-3(b)(1), unless the authorization is terminated  or revoked sooner.       Influenza A by PCR NEGATIVE NEGATIVE Final   Influenza B by PCR NEGATIVE NEGATIVE Final    Comment: (NOTE) The Xpert Xpress SARS-CoV-2/FLU/RSV plus assay is intended as an aid in the diagnosis of influenza from Nasopharyngeal swab specimens and should not be used as a sole basis for treatment. Nasal washings and aspirates are unacceptable for Xpert Xpress SARS-CoV-2/FLU/RSV testing.  Fact Sheet for Patients: EntrepreneurPulse.com.au  Fact Sheet for Healthcare Providers: IncredibleEmployment.be  This test is not yet approved or cleared by the [place] FDA and has been authorized for detection and/or diagnosis of SARS-CoV-2 by FDA under an Emergency Use Authorization (EUA). This EUA will remain in effect (meaning this test can be used) for the duration of the COVID-19 declaration under Section 564(b)(1) of the Act, 21 U.S.C. section 360bbb-3(b)(1), unless the authorization is terminated or revoked.  Performed at [facility], [address], [place], [place] [address]     Radiology Reports DG Chest 2 View  Result Date: 11/04/2020 CLINICAL DATA:  Chest pain and shortness of breath. EXAM: CHEST - 2 VIEW  COMPARISON:  05/30/2020 and CT chest 09/11/2018. FINDINGS: Trachea is midline. Heart is enlarged. Mild basilar streaky opacities appear fairly chronic, as on 09/11/2018. No airspace consolidation or pleural fluid. IMPRESSION: 1. No acute findings. 2. Bibasilar scarring. Electronically Signed   By: Lorin Picket M.D.   On: 11/04/2020 14:14   DG Knee Complete 4 Views Left  Result Date: 11/04/2020 CLINICAL DATA:  Anterolateral left knee pain for 2 weeks, no injury. EXAM: LEFT KNEE - COMPLETE 4+ VIEW COMPARISON:  None. FINDINGS: No acute osseous  or joint abnormality. No degenerative changes. Vascular calcifications. IMPRESSION: No findings to explain the patient's pain. Electronically Signed   By: Lorin Picket M.D.   On: 11/04/2020 14:11   ECHOCARDIOGRAM COMPLETE  Result Date: 11/04/2020    ECHOCARDIOGRAM REPORT   Patient Name:   MARQUAN VOKES [facility] Date of Exam: 11/04/2020 Medical Rec #:  [id number]           Height:       73.0 in Accession #:    [id number]          Weight:       255.0 lb Date of Birth:  [birth year]           BSA:          2.386 m Patient Age:    60 years            BP:           149/84 mmHg Patient Gender: M                   HR:           76 bpm. Exam Location:  [facility] Procedure: 2D Echo Indications:    Congestive Heart Failure I50.9  History:        Patient has prior history of Echocardiogram examinations, most                 recent 03/14/2020. CHF, Previous Myocardial Infarction and CAD,                 Signs/Symptoms:Chest Pain; Risk Factors:Dyslipidemia,                 Hypertension, Non-Smoker and Diabetes.  Sonographer:    Leavy Cella RDCS (AE) Referring Phys: [phone] [place]  1. Left ventricular ejection fraction, by estimation, is 35 to 40%. The left ventricle has moderately decreased function. The left ventricle demonstrates regional wall motion abnormalities (see scoring diagram/findings for description) consistent with ischemic cardiomyopathy. There is  moderate left ventricular hypertrophy. Left ventricular diastolic parameters are indeterminate. No formed LV mural thrombus noted with Definity contrast.  2. Right ventricular systolic function is normal. The right ventricular size is normal. Tricuspid regurgitation signal is inadequate for assessing PA pressure.  3. Large pericardial effusion. The pericardial effusion is posterior to the left ventricle, lateral to the left ventricle and localized near the right atrium. There is no clear evidence of cardiac tamponade. Overall similar in size to prior study in July 2021.  4. The mitral valve is abnormal. Trivial mitral valve regurgitation. Moderate mitral annular calcification.  5. The aortic valve is tricuspid. Aortic valve regurgitation is not visualized.  6. The inferior vena cava is normal in size with greater than 50% respiratory variability, suggesting right atrial pressure of 3 mmHg. FINDINGS  Left Ventricle: Left ventricular ejection fraction, by estimation, is 35 to 40%. The left ventricle has moderately decreased function. The left ventricle demonstrates regional wall motion abnormalities. Definity contrast agent was given IV to delineate the left ventricular endocardial borders. The left ventricular internal cavity size was normal in size. There is moderate left ventricular hypertrophy. Left ventricular diastolic parameters are indeterminate.  LV Wall Scoring: The mid and distal anterior septum and entire apex are akinetic. The mid inferolateral segment, mid inferoseptal segment, mid anterior segment, and mid inferior segment are hypokinetic. The antero-lateral wall, basal anteroseptal segment, basal inferolateral segment, basal anterior segment, basal inferior segment, and basal inferoseptal segment are normal. Right  Ventricle: The right ventricular size is normal. No increase in right ventricular wall thickness. Right ventricular systolic function is normal. Tricuspid regurgitation signal is inadequate  for assessing PA pressure. Left Atrium: Left atrial size was normal in size. Right Atrium: Right atrial size was normal in size. Pericardium: A large pericardial effusion is present. The pericardial effusion is posterior to the left ventricle, lateral to the left ventricle and localized near the right atrium. There is no evidence of cardiac tamponade. Mitral Valve: The mitral valve is abnormal. There is mild calcification of the mitral valve leaflet(s). Moderate mitral annular calcification. Trivial mitral valve regurgitation. Tricuspid Valve: The tricuspid valve is grossly normal. Tricuspid valve regurgitation is trivial. Aortic Valve: The aortic valve is tricuspid. There is mild aortic valve annular calcification. Aortic valve regurgitation is not visualized. Pulmonic Valve: The pulmonic valve was grossly normal. Pulmonic valve regurgitation is trivial. Aorta: The aortic root is normal in size and structure. Venous: The inferior vena cava is normal in size with greater than 50% respiratory variability, suggesting right atrial pressure of 3 mmHg. IAS/Shunts: No atrial level shunt detected by color flow Doppler.  LEFT VENTRICLE PLAX 2D LVIDd:         4.07 cm  Diastology LVIDs:         3.09 cm  LV e' medial:    4.26 cm/s LV PW:         1.74 cm  LV E/e' medial:  26.3 LV IVS:        1.43 cm  LV e' lateral:   6.18 cm/s LVOT diam:     2.00 cm  LV E/e' lateral: 18.1 LVOT Area:     3.14 cm  RIGHT VENTRICLE RV S prime:     9.79 cm/s TAPSE (M-mode): 2.1 cm LEFT ATRIUM              Index       RIGHT ATRIUM           Index LA diam:        5.00 cm  2.10 cm/m  RA Area:     15.30 cm LA Vol (A2C):   103.0 ml 43.17 ml/m RA Volume:   40.20 ml  16.85 ml/m LA Vol (A4C):   61.9 ml  25.95 ml/m LA Biplane Vol: 86.1 ml  36.09 ml/m   AORTA Ao Root diam: 3.90 cm MITRAL VALVE MV Area (PHT): 5.42 cm     SHUNTS MV Decel Time: 140 msec     Systemic Diam: 2.00 cm MV E velocity: 112.00 cm/s MV A velocity: 41.10 cm/s MV E/A ratio:  2.73  Rozann Lesches MD Electronically signed by Rozann Lesches MD Signature Date/Time: 11/04/2020/5:21:59 PM    Final      CBC Recent Labs  Lab 11/04/20 1337  WBC 5.1  HGB 11.0*  HCT 34.4*  PLT 174  MCV 92.7  MCH 29.6  MCHC 32.0  RDW 16.4*    Chemistries  Recent Labs  Lab 11/04/20 1337  NA 136  K 4.2  CL 98  CO2 25  GLUCOSE 246*  BUN 51*  CREATININE 1.89*  CALCIUM 9.1   ------------------------------------------------------------------------------------------------------------------ No results for input(s): CHOL, HDL, LDLCALC, TRIG, CHOLHDL, LDLDIRECT in the last 72 hours.  Lab Results  Component Value Date   HGBA1C 8.0 (H) 11/04/2020   ------------------------------------------------------------------------------------------------------------------ No results for input(s): TSH, T4TOTAL, T3FREE, THYROIDAB in the last 72 hours.  Invalid input(s): FREET3 ------------------------------------------------------------------------------------------------------------------ No results for input(s): VITAMINB12, FOLATE, FERRITIN, TIBC, IRON, RETICCTPCT in  the last 72 hours.  Coagulation profile No results for input(s): INR, PROTIME in the last 168 hours.  No results for input(s): DDIMER in the last 72 hours.  Cardiac Enzymes No results for input(s): CKMB, TROPONINI, MYOGLOBIN in the last 168 hours.  Invalid input(s): CK ------------------------------------------------------------------------------------------------------------------    Component Value Date/Time   BNP 322.4 (H) 03/13/2020 0001   BNP 12.4 07/29/2012 1040     Jamani Bearce M.D on 11/05/2020 at 3:17 PM  Go to [email or web address] - for contact info  [facility] - Office  [phone]

## 2020-11-05 NOTE — Care Management Obs Status (Signed)
[place] NOTIFICATION   Patient Details  Name: Eugene [name] MRN: [id number] Date of Birth: 1956-01-05   Medicare Observation Status Notification Given:  Yes    Natasha Bence, LCSW 11/05/2020, 5:58 PM

## 2020-11-06 ENCOUNTER — Other Ambulatory Visit: Payer: Self-pay

## 2020-11-06 ENCOUNTER — Telehealth: Payer: Self-pay | Admitting: Family Medicine

## 2020-11-06 DIAGNOSIS — N183 Chronic kidney disease, stage 3 unspecified: Secondary | ICD-10-CM | POA: Diagnosis not present

## 2020-11-06 DIAGNOSIS — Z7982 Long term (current) use of aspirin: Secondary | ICD-10-CM | POA: Diagnosis not present

## 2020-11-06 DIAGNOSIS — I130 Hypertensive heart and chronic kidney disease with heart failure and stage 1 through stage 4 chronic kidney disease, or unspecified chronic kidney disease: Secondary | ICD-10-CM | POA: Diagnosis not present

## 2020-11-06 DIAGNOSIS — Z20822 Contact with and (suspected) exposure to covid-19: Secondary | ICD-10-CM | POA: Diagnosis not present

## 2020-11-06 DIAGNOSIS — I5043 Acute on chronic combined systolic (congestive) and diastolic (congestive) heart failure: Secondary | ICD-10-CM | POA: Diagnosis not present

## 2020-11-06 DIAGNOSIS — I200 Unstable angina: Secondary | ICD-10-CM | POA: Diagnosis not present

## 2020-11-06 DIAGNOSIS — E1122 Type 2 diabetes mellitus with diabetic chronic kidney disease: Secondary | ICD-10-CM | POA: Diagnosis not present

## 2020-11-06 DIAGNOSIS — I214 Non-ST elevation (NSTEMI) myocardial infarction: Secondary | ICD-10-CM | POA: Diagnosis not present

## 2020-11-06 DIAGNOSIS — J45909 Unspecified asthma, uncomplicated: Secondary | ICD-10-CM | POA: Diagnosis not present

## 2020-11-06 DIAGNOSIS — J449 Chronic obstructive pulmonary disease, unspecified: Secondary | ICD-10-CM | POA: Diagnosis not present

## 2020-11-06 LAB — CBC
HCT: 30.7 % — ABNORMAL LOW (ref 39.0–52.0)
Hemoglobin: 9.7 g/dL — ABNORMAL LOW (ref 13.0–17.0)
MCH: 29.7 pg (ref 26.0–34.0)
MCHC: 31.6 g/dL (ref 30.0–36.0)
MCV: 93.9 fL (ref 80.0–100.0)
Platelets: 163 10*3/uL (ref 150–400)
RBC: 3.27 MIL/uL — ABNORMAL LOW (ref 4.22–5.81)
RDW: 16.2 % — ABNORMAL HIGH (ref 11.5–15.5)
WBC: 5.8 10*3/uL (ref 4.0–10.5)
nRBC: 0 % (ref 0.0–0.2)

## 2020-11-06 LAB — TROPONIN I (HIGH SENSITIVITY): Troponin I (High Sensitivity): 159 ng/L (ref <18)

## 2020-11-06 LAB — BASIC METABOLIC PANEL
Anion gap: 10 (ref 5–15)
BUN: 52 mg/dL — ABNORMAL HIGH (ref 8–23)
CO2: 29 mmol/L (ref 22–32)
Calcium: 8.7 mg/dL — ABNORMAL LOW (ref 8.9–10.3)
Chloride: 96 mmol/L — ABNORMAL LOW (ref 98–111)
Creatinine, Ser: 2.12 mg/dL — ABNORMAL HIGH (ref 0.61–1.24)
GFR, Estimated: 34 mL/min — ABNORMAL LOW (ref >60)
Glucose, Bld: 157 mg/dL — ABNORMAL HIGH (ref 70–99)
Potassium: 3.9 mmol/L (ref 3.5–5.1)
Sodium: 135 mmol/L (ref 135–145)

## 2020-11-06 LAB — GLUCOSE, CAPILLARY
Glucose-Capillary: 122 mg/dL — ABNORMAL HIGH (ref 70–99)
Glucose-Capillary: 161 mg/dL — ABNORMAL HIGH (ref 70–99)

## 2020-11-06 MED ORDER — TORSEMIDE 20 MG PO TABS: 40.0000 mg | ORAL_TABLET | Freq: Every day | ORAL | 5 refills | Status: AC

## 2020-11-06 MED ORDER — FUROSEMIDE 10 MG/ML IJ SOLN: 20.0000 mg | Freq: Every day | INTRAMUSCULAR | Status: DC

## 2020-11-06 MED ORDER — CLOPIDOGREL BISULFATE 75 MG PO TABS: 75.0000 mg | ORAL_TABLET | Freq: Every day | ORAL | 3 refills | Status: AC

## 2020-11-06 MED ORDER — ASPIRIN 81 MG PO TBEC: 81.0000 mg | DELAYED_RELEASE_TABLET | Freq: Every day | ORAL | 12 refills | Status: AC

## 2020-11-06 NOTE — Progress Notes (Signed)
Nsg Discharge Note  Admit Date:  11/04/2020 Discharge date: 11/06/2020   Ketih Goodie Gugel to be D/C'd Home per MD order.  AVS completed.  Copy for chart, and copy for patient signed, and dated. Patient/caregiver able to verbalize understanding. IV removed. Discharge paper work given and reviewed with patient. Patient stable upon discharge.   Discharge Medication: Allergies as of 11/06/2020      Reactions   Hydrocodone Nausea And Vomiting   Lisinopril Cough   Neurontin [gabapentin] Other (See Comments)   Reaction:  Suicidal thoughts    Statins Other (See Comments)   Reaction:  Muscle pain    Metformin And Related Diarrhea   Norvasc [amlodipine Besylate] Swelling, Other (See Comments)   Reaction:  Pedal edema      Medication List    STOP taking these medications   colchicine 0.6 MG tablet   esomeprazole 20 MG packet Commonly known as: NEXIUM   sucralfate 1 g tablet Commonly known as: Carafate     TAKE these medications   Accu-Chek Softclix Lancets lancets 1 each 3 (three) times daily.   acetaminophen 500 MG tablet Commonly known as: TYLENOL Take 1,500 mg by mouth every 6 (six) hours as needed for mild pain, fever or headache.   allopurinol 100 MG tablet Commonly known as: ZYLOPRIM Take 100 mg by mouth as needed.   ALPRAZolam 0.5 MG tablet Commonly known as: XANAX Take 1-2 tablets (0.5-1 mg total) by mouth at bedtime as needed for sleep.   aspirin 81 MG EC tablet Take 1 tablet (81 mg total) by mouth daily with breakfast. What changed: when to take this   Cholecalciferol 25 MCG (1000 UT) capsule Take 1 capsule by mouth daily.   clopidogrel 75 MG tablet Commonly known as: PLAVIX Take 1 tablet (75 mg total) by mouth daily.   Droplet Pen Needles 32G X 4 MM Misc Generic drug: Insulin Pen Needle USE EVERY DAY AS DIRECTED   glimepiride 1 MG tablet Commonly known as: AMARYL TAKE 1 TABLET WITH MEALS AS DIRECTED. MAX 3 TABLETS PER DAY   isosorbide mononitrate  60 MG 24 hr tablet Commonly known as: IMDUR Take 1 tablet (60 mg total) by mouth daily.   Levemir FlexTouch 100 UNIT/ML FlexPen Generic drug: insulin detemir INJECT 0.8 MLS (80 UNITS TOTAL) INTO THE SKIN DAILY. What changed: See the new instructions.   losartan 25 MG tablet Commonly known as: COZAAR Take 25 mg by mouth daily.   metoprolol succinate 50 MG 24 hr tablet Commonly known as: TOPROL-XL Take 1 tablet (50 mg total) by mouth daily. Take with or immediately following a meal.   nitroGLYCERIN 0.4 MG SL tablet Commonly known as: NITROSTAT Place 0.4 mg under the tongue every 5 (five) minutes as needed for chest pain.   ofloxacin 0.3 % OTIC solution Commonly known as: FLOXIN 5 drops as needed.   pantoprazole 40 MG tablet Commonly known as: PROTONIX Take 1 tablet (40 mg total) by mouth daily.   Repatha SureClick 470 MG/ML [place] Generic drug: Evolocumab Inject 1 Dose into the skin every 14 (fourteen) days.   torsemide 20 MG tablet Commonly known as: DEMADEX Take 2 tablets (40 mg total) by mouth daily. Start taking on: November 07, 2020   traMADol 50 MG tablet Commonly known as: ULTRAM TAKE 1 TABLET (50 MG TOTAL) BY MOUTH EVERY 6 (SIX) HOURS AS NEEDED What changed:   how much to take  how to take this  when to take this  reasons to take  this  additional instructions       Discharge Assessment: Vitals:   11/06/20 0527 11/06/20 0823  BP: 116/62 112/65  Pulse: 62 73  Resp:  20  Temp: 98 F (36.7 C)   SpO2: 95% 95%   Skin clean, dry and intact without evidence of skin break down, no evidence of skin tears noted. IV catheter discontinued intact. Site without signs and symptoms of complications - no redness or edema noted at insertion site, patient denies c/o pain - only slight tenderness at site.  Dressing with slight pressure applied.  D/c Instructions-Education: Discharge instructions given to patient/family with verbalized understanding. D/c education  completed with patient/family including follow up instructions, medication list, d/c activities limitations if indicated, with other d/c instructions as indicated by MD - patient able to verbalize understanding, all questions fully answered. Patient instructed to return to ED, call 911, or call MD for any changes in condition.  Patient escorted via [address], and D/C home via private auto.  Zenaida Deed, RN 11/06/2020 11:44 AM

## 2020-11-06 NOTE — Telephone Encounter (Signed)
Nurses Patient was discharged from the hospital for fluid and heart related issues Connect with him-it is quite possible that he might be following up with a cardiologist in the next several days if so it is not necessary to follow-up with [place].  But if it will take several weeks to get in with cardiology it would be wise for him to follow-up with him 7 to 10 days with [place].    Nurse's-patient recently discharged from the hospital.   Please call patient, let them know that we are aware that they were discharged from the hospital.  We need for patient to do standard hospital follow up visit.  Please schedule them to follow-up with [place] within the next 7 to 14 days. Advised the patient to bring all of their medications with him to the visit.   Review meds with patient does patient understand meds?  Is patient having any immediate needs or questions?

## 2020-11-06 NOTE — Discharge Instructions (Signed)
1)Very low-salt diet advised 2)Weigh yourself daily, call if you gain more than 3 pounds in 1 day or more than 5 pounds in 1 week as your diuretic medications may need to be adjusted 3)Avoid ibuprofen/Advil/Aleve/Motrin/Goody Powders/Naproxen/BC powders/Meloxicam/Diclofenac/Indomethacin and other Nonsteroidal anti-inflammatory medications as these will make you more likely to bleed and can cause stomach ulcers, can also cause Kidney problems.  4) follow-up with a cardiologist Dr. [name] within a week for recheck and reevaluation 5) follow-up with your kidney doctor in 1 to 2 weeks for recheck and reevaluation and repeat BMP blood test

## 2020-11-06 NOTE — Discharge Summary (Signed)
Eugene [name], is a 65 y.o. male  DOB [birth year]  MRN [id number].  Admission date:  11/04/2020  Admitting Physician  Eugene Mainland, DO  Discharge Date:  11/06/2020   Primary MD  Eugene Drown, MD  Recommendations for primary care physician for things to follow:    1)Very low-salt diet advised 2)Weigh yourself daily, call if you gain more than 3 pounds in 1 day or more than 5 pounds in 1 week as your diuretic medications may need to be adjusted 3)Avoid ibuprofen/Advil/Aleve/Motrin/Goody Powders/Naproxen/BC powders/Meloxicam/Diclofenac/Indomethacin and other Nonsteroidal anti-inflammatory medications as these will make you more likely to bleed and can cause stomach ulcers, can also cause Kidney problems.  4) follow-up with a cardiologist Dr. Carlyle [name] within a week for recheck and reevaluation 5) follow-up with your kidney doctor in 1 to 2 weeks for recheck and reevaluation and repeat BMP blood test  Admission Diagnosis  NSTEMI (non-ST elevated myocardial infarction) ([place]) [I21.4] Chronic anemia [D64.9] Chest pain [R07.9] Stage 3b chronic kidney disease ([place]) [N18.32]   Discharge Diagnosis  NSTEMI (non-ST elevated myocardial infarction) ([place]) [I21.4] Chronic anemia [D64.9] Chest pain [R07.9] Stage 3b chronic kidney disease ([place]) [N18.32]    Principal Problem:   Unstable angina ([place]) Active Problems:   CAD S/P percutaneous coronary angioplasty   Essential hypertension   Chronic diastolic CHF (congestive heart failure) (HCC)   Chest pain   Right carotid bruit   Diabetes mellitus due to underlying condition, uncontrolled, with hyperglycemia, with long-term current use of insulin (HCC)   Chronic renal disease, stage III ([place])      Past Medical History:  Diagnosis Date  . Anemia   . Anxiety   . Asthma   . Bell palsy   . C. difficile colitis    a. 08/2012  . CAD (coronary artery  disease)    a. 05/2011 s/p DES to PDA and RCA. b. 08/2012 Inflat  STEMI/Cath/PCI: LM minor irregs, LAD 50p, D1 50, LCX nl, OM1 25, RCA 30-40p, 100d (treated with 2.75x48mm Promus Premier DES);    [name] Cholelithiasis 07/2012   Asymptomatic; identified incidentally  . Chronic diastolic CHF (congestive heart failure) ([place])   . CKD (chronic kidney disease), stage III ([place])   . Contrast dye induced nephropathy    a. 08/2012 post cath/pci  . COPD (chronic obstructive pulmonary disease) ([place])   . GERD (gastroesophageal reflux disease)   . Gout   . Hyperlipidemia   . Hypertension   . Myocardial infarct ([place]) 09/08/12  . Nephrolithiasis   . Pericardial effusion    a. s/p window in 2014. b. s/p redo window in 09/2018.  [name] Peripheral neuropathy   . Sleep apnea   . Statin intolerance   . Type 2 diabetes mellitus ([place])     Past Surgical History:  Procedure Laterality Date  . CARDIAC CATHETERIZATION    . CHEST TUBE INSERTION    . CIRCUMCISION    . COLONOSCOPY     In [facility], approximately 2011 per patient, was normal. Advised  to come back in 10 years.  . ESOPHAGOGASTRODUODENOSCOPY     in [place] over 20 yrs ago  . ESOPHAGOGASTRODUODENOSCOPY  06/12/2012   TLX:BWIOMB esophagus-status post Venia Minks dilation. Abnormal gastric mucosa of uncertain significance-status post biopsy  . EYE SURGERY    . INTRAOPERATIVE TRANSESOPHAGEAL ECHOCARDIOGRAM N/A 06/23/2013   Procedure: INTRAOPERATIVE TRANSESOPHAGEAL ECHOCARDIOGRAM;  Surgeon: Eugene Isaac, MD;  Location: [facility];  Service: Open Heart Surgery;  Laterality: N/A;  . LEFT HEART CATH AND CORONARY ANGIOGRAPHY N/A 03/14/2020   Procedure: LEFT HEART CATH AND CORONARY ANGIOGRAPHY;  Surgeon: Eugene Harp, MD;  Location: [facility];  Service: Cardiovascular;  Laterality: N/A;  . LEFT HEART CATHETERIZATION WITH CORONARY ANGIOGRAM N/A 09/08/2012   Procedure: LEFT HEART CATHETERIZATION WITH CORONARY ANGIOGRAM;  Surgeon: [name],  MD;  Location: [facility] CATH LAB;  Service: Cardiovascular;  Laterality: N/A;  . stents    . SUBXYPHOID PERICARDIAL WINDOW N/A 06/23/2013   Procedure: SUBXYPHOID PERICARDIAL WINDOW;  Surgeon: Eugene Isaac, MD;  Location: [facility];  Service: Thoracic;  Laterality: N/A;  . SUBXYPHOID PERICARDIAL WINDOW  10/01/2018  . SUBXYPHOID PERICARDIAL WINDOW N/A 10/01/2018   Procedure: REDO SUBXYPHOID PERICARDIAL WINDOW;  Surgeon: Eugene Isaac, MD;  Location: [facility];  Service: Thoracic;  Laterality: N/A;  . TEE WITHOUT CARDIOVERSION N/A 10/01/2018   Procedure: TRANSESOPHAGEAL ECHOCARDIOGRAM (TEE);  Surgeon: Eugene Isaac, MD;  Location[facility];  Service: Thoracic;  Laterality: N/A;     HPI  from the history and physical done on the day of admission:     Chief Complaint: Chest pain  HPI: Eugene [name] is a 65 y.o. male with a history of coronary artery disease with cardiomyopathy and chronic diastolic heart failure, history of drug-eluting stents, hypertensive, COPD, pericardial effusion status post window in 2014 and 2020, diabetes on insulin.  Patient presents with chest pain that started last night in his right chest and radiates into his neck.  He took 2 nitroglycerin, which eased the pain and he was able to fall asleep.  When he woke up this morning, his pain had returned and he came to the emergency department for evaluation.  Pain occurs at rest and is not worsened with activity.  No other palliating or provoking factors.  Denies fevers, chills, nausea, vomiting.  Emergency Department Course: High sensitivity troponin 207 with 198 on repeat.  Creatinine 1.89 (around his baseline).  Cardiology was consulted.  Echo done     Hospital Course:    Brief Summary:- Eugene Testa Phillippiis a 65 y.o.malewith a history ofCAD s/p PCI (DES to the RCA in 2012 and 2014 ), chronic grade 3 diastolic heart failure (restrictive), recurrent pericardial effusion s/p pericardial windowin2014 and Feb 2020  with residualmoderatepericardial effusion without tamponade, COPD / emphysema HTN,diabetes,CKD III3B, and OSA admitted on 11/04/2020 with chest discomfort and possible NSTEMI   LHC 03/14/20:   Previously placed Mid RCA to Dist RCA stent (unknown type) is widely patent.  Prox Cx lesion is 50% stenosed.  Mid RCA lesion is 40% stenosed.  Mid LAD-1 lesion is 50% stenosed.  Mid LAD-2 lesion is 100% stenosed with 100% stenosed side branch in 2nd Sept.  Mid LAD-3 lesion is 100% stenosed.  IMPRESSION: As per Dr Gwenlyn Found --[name] has a total LAD occlusion/with extensive disease in the midportion without collaterals. Given the fact that there were no collaterals I suspect that he has no viable myocardium beyond his occluded mid LAD and therefore no benefit would be derived  from opening this diffusely diseased vessel. 2D echo is pending. Recommend medical therapy. If he has ongoing pain and an akinetic anterior wall we could consider doing a viability cardiac MRI. Depending on his LV function, if his EF is less than 35% he may benefit from aLifeVest.  A/p 1)Recurrent Chest Pain-possible NSTEMI  H/o extensive CAD, s/p PCI (DES to the RCA in 2012 and 2014 )-- LHC from 03/14/2020 as noted above with total LAD occlusion without significant collaterals, medical management advised as patient is NOT likely to benefit would be derived from opening this diffusely diseased vessel. -Troponin--- 207>>198>>199>>175>>159 -Cardiology consult from Dr. Domenic Polite appreciated -Continue Imdur,  aspirin and clopidogrel , Toprol-XL and losartan -He is intolerant of statins, currently receiving Repatha twice monthly -LDL was 47 in August 2021 -As per cardiologist hold off on repeat LHC as long as patient is chest pain-free and EKG and troponins do not indicate worsening ischemia -Patient has CKD 3B with risk for contrast nephropathy if LHC is performed --No further chest pains at this time -Patient  ambulated over 200 feet with me in the hallways without chest pains or significant dyspnea on exertion or hypoxia today -Repeat EKG on 11/06/2020 nonacute and without significant changes from prior -EKG reviewed with on-call cardiologist Dr. Marisue Ivan -Given troponin trend, chest pain-free status despite activity and nonacute EKG Dr. Marisue Ivan recommends outpatient cardiology follow-up --Dr Marisue Ivan states Okay to discharge home from cardiology standpoint   2)HFrEF--# Acute on chronic systolic and diastolic heart failure: -Patient with ischemic cardiomyopathy/systolic dysfunction as well as grade 3 diastolic dysfunction CHF (restrictive) -Repeat echo with EF down to 35 to 40% -to resume torsemide -Continue losartan  3)Recurrent pericardial effusions: -Repeat echo with persistent pericardial effusion without tamponade.  Previously had 2 pericardial windows, last time in 2020.   He does not have any symptoms of pericarditis.  -Continue torsemide  4)DM2- -  A1c 8.0 reflecting uncontrolled diabetes with hyperglycemia PTA - Levemir     5)CKD 3B--creatinine is up to 2 which is not far off from patient's usual baseline - renally adjust medications, avoid nephrotoxic agents / dehydration  / hypotension  6)GERD--Protonix 40 mg daily  7)COPD--- patient is a reformed smoker with emphysema, no acute flareup, continue as needed bronchodilators  8) class II obesity- -Low calorie diet, portion control and increase physical activity discussed with patient -Body mass index is 33.51 kg/m.  9) chronic anemia--- overall stable, no evidence of ongoing bleeding -Patient is on DAPT, PPI use advised  Disposition-Home Disposition: The patient is from: Home  Anticipated d/c is to: Home  Code Status :  -  Code Status: Full Code   Family Communication:    NA (patient is alert, awake and coherent)   Consults  :  cardiology  Discharge Condition: Stable, chest pain-free  despite activity  Follow UP   Follow-up Information    Branch, Alphonse Guild, MD. Schedule an appointment as soon as possible for a visit in 1 week(s).   Specialty: Cardiology Contact information: [address] [place] [address] [phone]        Liana Gerold, MD Follow up in 2 week(s).   Specialty: Nephrology Why: Repeat BMP Blood test Contact information: [address] [address] [phone]                Consults obtained -cardiology service Dr. Domenic Polite, also discussed with on-call cardiologist Dr. Marisue Ivan prior to discharge on 11/06/2020  Diet and Activity recommendation:  As advised  Discharge Instructions    Discharge Instructions    Call MD for:  difficulty breathing, headache or visual disturbances   Complete by: As directed    Call MD for:  persistant dizziness or light-headedness   Complete by: As directed    Call MD for:  persistant nausea and vomiting   Complete by: As directed    Call MD for:  severe uncontrolled pain   Complete by: As directed    Call MD for:  temperature >100.4   Complete by: As directed    Diet - low sodium heart healthy   Complete by: As directed    Diet Carb Modified   Complete by: As directed    Discharge instructions   Complete by: As directed    1)Very low-salt diet advised 2)Weigh yourself daily, call if you gain more than 3 pounds in 1 day or more than 5 pounds in 1 week as your diuretic medications may need to be adjusted 3)Avoid ibuprofen/Advil/Aleve/Motrin/Goody Powders/Naproxen/BC powders/Meloxicam/Diclofenac/Indomethacin and other Nonsteroidal anti-inflammatory medications as these will make you more likely to bleed and can cause stomach ulcers, can also cause Kidney problems.  4) follow-up with a cardiologist Dr. Carlyle [name] within a week for recheck and reevaluation 5) follow-up with your kidney doctor in 1 to 2 weeks for recheck and reevaluation and repeat BMP blood test    Increase activity slowly   Complete by: As directed        Discharge Medications     Allergies as of 11/06/2020      Reactions   Hydrocodone Nausea And Vomiting   Lisinopril Cough   Neurontin [gabapentin] Other (See Comments)   Reaction:  Suicidal thoughts    Statins Other (See Comments)   Reaction:  Muscle pain    Metformin And Related Diarrhea   Norvasc [amlodipine Besylate] Swelling, Other (See Comments)   Reaction:  Pedal edema      Medication List    STOP taking these medications   colchicine 0.6 MG tablet   esomeprazole 20 MG packet Commonly known as: NEXIUM   sucralfate 1 g tablet Commonly known as: Carafate     TAKE these medications   Accu-Chek Softclix Lancets lancets 1 each 3 (three) times daily.   acetaminophen 500 MG tablet Commonly known as: TYLENOL Take 1,500 mg by mouth every 6 (six) hours as needed for mild pain, fever or headache.   allopurinol 100 MG tablet Commonly known as: ZYLOPRIM Take 100 mg by mouth as needed.   ALPRAZolam 0.5 MG tablet Commonly known as: XANAX Take 1-2 tablets (0.5-1 mg total) by mouth at bedtime as needed for sleep.   aspirin 81 MG EC tablet Take 1 tablet (81 mg total) by mouth daily with breakfast. What changed: when to take this   Cholecalciferol 25 MCG (1000 UT) capsule Take 1 capsule by mouth daily.   clopidogrel 75 MG tablet Commonly known as: PLAVIX Take 1 tablet (75 mg total) by mouth daily.   Droplet Pen Needles 32G X 4 MM Misc Generic drug: Insulin Pen Needle USE EVERY DAY AS DIRECTED   glimepiride 1 MG tablet Commonly known as: AMARYL TAKE 1 TABLET WITH MEALS AS DIRECTED. MAX 3 TABLETS PER DAY   isosorbide mononitrate 60 MG 24 hr tablet Commonly known as: IMDUR Take 1 tablet (60 mg total) by mouth daily.   Levemir FlexTouch 100 UNIT/ML FlexPen Generic drug: insulin detemir INJECT 0.8 MLS (80 UNITS TOTAL) INTO THE SKIN DAILY. What changed: See the new instructions.  losartan 25 MG  tablet Commonly known as: COZAAR Take 25 mg by mouth daily.   metoprolol succinate 50 MG 24 hr tablet Commonly known as: TOPROL-XL Take 1 tablet (50 mg total) by mouth daily. Take with or immediately following a meal.   nitroGLYCERIN 0.4 MG SL tablet Commonly known as: NITROSTAT Place 0.4 mg under the tongue every 5 (five) minutes as needed for chest pain.   ofloxacin 0.3 % OTIC solution Commonly known as: FLOXIN 5 drops as needed.   pantoprazole 40 MG tablet Commonly known as: PROTONIX Take 1 tablet (40 mg total) by mouth daily.   Repatha SureClick 782 MG/ML Soaj Generic drug: Evolocumab Inject 1 Dose into the skin every 14 (fourteen) days.   torsemide 20 MG tablet Commonly known as: DEMADEX Take 2 tablets (40 mg total) by mouth daily. Start taking on: November 07, 2020   traMADol 50 MG tablet Commonly known as: ULTRAM TAKE 1 TABLET (50 MG TOTAL) BY MOUTH EVERY 6 (SIX) HOURS AS NEEDED What changed:   how much to take  how to take this  when to take this  reasons to take this  additional instructions       Major procedures and Radiology Reports - PLEASE review detailed and final reports for all details, in brief -  DG Chest 2 View  Result Date: 11/04/2020 CLINICAL DATA:  Chest pain and shortness of breath. EXAM: CHEST - 2 VIEW COMPARISON:  05/30/2020 and CT chest 09/11/2018. FINDINGS: Trachea is midline. Heart is enlarged. Mild basilar streaky opacities appear fairly chronic, as on 09/11/2018. No airspace consolidation or pleural fluid. IMPRESSION: 1. No acute findings. 2. Bibasilar scarring. Electronically Signed   By: Lorin Picket M.D.   On: 11/04/2020 14:14   DG Knee Complete 4 Views Left  Result Date: 11/04/2020 CLINICAL DATA:  Anterolateral left knee pain for 2 weeks, no injury. EXAM: LEFT KNEE - COMPLETE 4+ VIEW COMPARISON:  None. FINDINGS: No acute osseous or joint abnormality. No degenerative changes. Vascular calcifications. IMPRESSION: No findings to  explain the patient's pain. Electronically Signed   By: Lorin Picket M.D.   On: 11/04/2020 14:11   ECHOCARDIOGRAM COMPLETE  Result Date: 11/04/2020    ECHOCARDIOGRAM REPORT   Patient Name:   Eugene [name] [facility] Date of Exam: 11/04/2020 Medical Rec #:  [id number]           Height:       73.0 in Accession #:    [id number]          Weight:       255.0 lb Date of Birth:  1956-03-10           BSA:          2.386 m Patient Age:    57 years            BP:           149/84 mmHg Patient Gender: M                   HR:           76 bpm. Exam Location:  [facility] Procedure: 2D Echo Indications:    Congestive Heart Failure I50.9  History:        Patient has prior history of Echocardiogram examinations, most                 recent 03/14/2020. CHF, Previous Myocardial Infarction and CAD,  Signs/Symptoms:Chest Pain; Risk Factors:Dyslipidemia,                 Hypertension, Non-Smoker and Diabetes.  Sonographer:    Leavy Cella RDCS (AE) Referring Phys: [phone] [place]  1. Left ventricular ejection fraction, by estimation, is 35 to 40%. The left ventricle has moderately decreased function. The left ventricle demonstrates regional wall motion abnormalities (see scoring diagram/findings for description) consistent with ischemic cardiomyopathy. There is moderate left ventricular hypertrophy. Left ventricular diastolic parameters are indeterminate. No formed LV mural thrombus noted with Definity contrast.  2. Right ventricular systolic function is normal. The right ventricular size is normal. Tricuspid regurgitation signal is inadequate for assessing PA pressure.  3. Large pericardial effusion. The pericardial effusion is posterior to the left ventricle, lateral to the left ventricle and localized near the right atrium. There is no clear evidence of cardiac tamponade. Overall similar in size to prior study in July 2021.  4. The mitral valve is abnormal. Trivial mitral valve regurgitation.  Moderate mitral annular calcification.  5. The aortic valve is tricuspid. Aortic valve regurgitation is not visualized.  6. The inferior vena cava is normal in size with greater than 50% respiratory variability, suggesting right atrial pressure of 3 mmHg. FINDINGS  Left Ventricle: Left ventricular ejection fraction, by estimation, is 35 to 40%. The left ventricle has moderately decreased function. The left ventricle demonstrates regional wall motion abnormalities. Definity contrast agent was given IV to delineate the left ventricular endocardial borders. The left ventricular internal cavity size was normal in size. There is moderate left ventricular hypertrophy. Left ventricular diastolic parameters are indeterminate.  LV Wall Scoring: The mid and distal anterior septum and entire apex are akinetic. The mid inferolateral segment, mid inferoseptal segment, mid anterior segment, and mid inferior segment are hypokinetic. The antero-lateral wall, basal anteroseptal segment, basal inferolateral segment, basal anterior segment, basal inferior segment, and basal inferoseptal segment are normal. Right Ventricle: The right ventricular size is normal. No increase in right ventricular wall thickness. Right ventricular systolic function is normal. Tricuspid regurgitation signal is inadequate for assessing PA pressure. Left Atrium: Left atrial size was normal in size. Right Atrium: Right atrial size was normal in size. Pericardium: A large pericardial effusion is present. The pericardial effusion is posterior to the left ventricle, lateral to the left ventricle and localized near the right atrium. There is no evidence of cardiac tamponade. Mitral Valve: The mitral valve is abnormal. There is mild calcification of the mitral valve leaflet(s). Moderate mitral annular calcification. Trivial mitral valve regurgitation. Tricuspid Valve: The tricuspid valve is grossly normal. Tricuspid valve regurgitation is trivial. Aortic Valve: The  aortic valve is tricuspid. There is mild aortic valve annular calcification. Aortic valve regurgitation is not visualized. Pulmonic Valve: The pulmonic valve was grossly normal. Pulmonic valve regurgitation is trivial. Aorta: The aortic root is normal in size and structure. Venous: The inferior vena cava is normal in size with greater than 50% respiratory variability, suggesting right atrial pressure of 3 mmHg. IAS/Shunts: No atrial level shunt detected by color flow Doppler.  LEFT VENTRICLE PLAX 2D LVIDd:         4.07 cm  Diastology LVIDs:         3.09 cm  LV e' medial:    4.26 cm/s LV PW:         1.74 cm  LV E/e' medial:  26.3 LV IVS:        1.43 cm  LV e' lateral:   6.18 cm/s  LVOT diam:     2.00 cm  LV E/e' lateral: 18.1 LVOT Area:     3.14 cm  RIGHT VENTRICLE RV S prime:     9.79 cm/s TAPSE (M-mode): 2.1 cm LEFT ATRIUM              Index       RIGHT ATRIUM           Index LA diam:        5.00 cm  2.10 cm/m  RA Area:     15.30 cm LA Vol (A2C):   103.0 ml 43.17 ml/m RA Volume:   40.20 ml  16.85 ml/m LA Vol (A4C):   61.9 ml  25.95 ml/m LA Biplane Vol: 86.1 ml  36.09 ml/m   AORTA Ao Root diam: 3.90 cm MITRAL VALVE MV Area (PHT): 5.42 cm     SHUNTS MV Decel Time: 140 msec     Systemic Diam: 2.00 cm MV E velocity: 112.00 cm/s MV A velocity: 41.10 cm/s MV E/A ratio:  2.73 Rozann Lesches MD Electronically signed by Rozann Lesches MD Signature Date/Time: 11/04/2020/5:21:59 PM    Final     Micro Results   Recent Results (from the past 240 hour(s))  Resp Panel by RT-PCR (Flu A&B, Covid) Nasopharyngeal Swab     Status: None   Collection Time: 11/04/20  3:49 PM   Specimen: Nasopharyngeal Swab; Nasopharyngeal(NP) swabs in vial transport medium  Result Value Ref Range Status   SARS Coronavirus 2 by RT PCR NEGATIVE NEGATIVE Final    Comment: (NOTE) SARS-CoV-2 target nucleic acids are NOT DETECTED.  The SARS-CoV-2 RNA is generally detectable in upper respiratory specimens during the acute phase of  infection. The lowest concentration of SARS-CoV-2 viral copies this assay can detect is 138 copies/mL. A negative result does not preclude SARS-Cov-2 infection and should not be used as the sole basis for treatment or other patient management decisions. A negative result may occur with  improper specimen collection/handling, submission of specimen other than nasopharyngeal swab, presence of viral mutation(s) within the areas targeted by this assay, and inadequate number of viral copies(<138 copies/mL). A negative result must be combined with clinical observations, patient history, and epidemiological information. The expected result is Negative.  Fact Sheet for Patients:  EntrepreneurPulse.com.au  Fact Sheet for Healthcare Providers:  IncredibleEmployment.be  This test is no t yet approved or cleared by the [place] FDA and  has been authorized for detection and/or diagnosis of SARS-CoV-2 by FDA under an Emergency Use Authorization (EUA). This EUA will remain  in effect (meaning this test can be used) for the duration of the COVID-19 declaration under Section 564(b)(1) of the Act, 21 U.S.C.section 360bbb-3(b)(1), unless the authorization is terminated  or revoked sooner.       Influenza A by PCR NEGATIVE NEGATIVE Final   Influenza B by PCR NEGATIVE NEGATIVE Final    Comment: (NOTE) The Xpert Xpress SARS-CoV-2/FLU/RSV plus assay is intended as an aid in the diagnosis of influenza from Nasopharyngeal swab specimens and should not be used as a sole basis for treatment. Nasal washings and aspirates are unacceptable for Xpert Xpress SARS-CoV-2/FLU/RSV testing.  Fact Sheet for Patients: EntrepreneurPulse.com.au  Fact Sheet for Healthcare Providers: IncredibleEmployment.be  This test is not yet approved or cleared by the [place] FDA and has been authorized for detection and/or diagnosis of SARS-CoV-2  by FDA under an Emergency Use Authorization (EUA). This EUA will remain in effect (meaning this test can be used) for  the duration of the COVID-19 declaration under Section 564(b)(1) of the Act, 21 U.S.C. section 360bbb-3(b)(1), unless the authorization is terminated or revoked.  Performed at [facility], [address] [place], [place] [address]    Today   Subjective    Burel Gripp today has no new complaints No fever  Or chills  No Nausea, Vomiting or Diarrhea -chest pain-free despite activity       Patient has been seen and examined prior to discharge   Objective   Blood pressure 112/65, pulse 73, temperature 98 F (36.7 C), temperature source Oral, resp. rate 20, height 6\' 1"  (1.854 m), weight 115.2 kg, SpO2 95 %.   Intake/Output Summary (Last 24 hours) at 11/06/2020 1055 Last data filed at 11/06/2020 0900 Gross per 24 hour  Intake 960 ml  Output --  Net 960 ml    Exam Gen:- Awake Alert,  In no apparent distress  HEENT:- [email or web address].AT, No sclera icterus Neck-Supple Neck,  Lungs-diminished breath sounds, no wheezing CV- S1, S2 normal, regular  Abd-  +ve B.Sounds, Abd Soft, No tenderness,    Extremity/Skin:-1+ pitting edema, pedal pulses present  Psych-affect is appropriate, oriented x3 Neuro-no new focal deficits, no tremors   Data Review   CBC w Diff:  Lab Results  Component Value Date   WBC 5.8 11/06/2020   HGB 9.7 (L) 11/06/2020   HGB 13.8 08/06/2019   HCT 30.7 (L) 11/06/2020   HCT 43.4 08/06/2019   PLT 163 11/06/2020   PLT 219 08/06/2019   LYMPHOPCT 20 09/20/2019   MONOPCT 11 09/20/2019   EOSPCT 2 09/20/2019   BASOPCT 1 09/20/2019    CMP:  Lab Results  Component Value Date   NA 135 11/06/2020   NA 141 08/02/2020   K 3.9 11/06/2020   CL 96 (L) 11/06/2020   CO2 29 11/06/2020   BUN 52 (H) 11/06/2020   BUN 41 (H) 08/02/2020   CREATININE 2.12 (H) 11/06/2020   CREATININE 1.26 10/26/2014   PROT 6.7 08/02/2020   ALBUMIN 3.9 08/02/2020    BILITOT 0.6 08/02/2020   ALKPHOS 80 08/02/2020   AST 15 08/02/2020   ALT 9 08/02/2020  .   Total Discharge time is about 33 minutes  Roxan Hockey M.D on 11/06/2020 at 10:55 AM  Go to [email or web address] -  for contact info  [facility] - Office  [phone]

## 2020-11-07 ENCOUNTER — Other Ambulatory Visit: Payer: Self-pay | Admitting: Family Medicine

## 2020-11-07 NOTE — Telephone Encounter (Signed)
Patient states he is doing good and has a follow up appointment scheduled with Cardiologist Dr [name] next Monday 11/14/20. Patient to call back and let us know if any problems.

## 2020-11-08 ENCOUNTER — Ambulatory Visit: Payer: Medicare HMO | Admitting: Family Medicine

## 2020-11-13 NOTE — Progress Notes (Signed)
Cardiology Office Note  Date: 11/14/2020   ID: Eugene [name], DOB 01/15/1956, MRN [id number]  PCP:  [name], MD  Cardiologist:  [name], MD Electrophysiologist:  None   Chief Complaint: Hospital follow up  History of Present Illness: Eugene [name] is a 65 y.o. male with a history of NSTEMI, CAD status post PCI, chronic diastolic heart failure, OSA on CPAP, HTN, HLD, stage IIIb CKD.  History of large pericardial effusions with pericardial windows in the past x2.  Previous PCI 2012 and 2014, recurrent pericardial effusion status post pericardial window 2014 in February 2020 with residual moderate pericardial effusion without tamponade.  Out of hospital NSTEMI 03/14/2020 cardiac catheterization showed occluded mid LAD, patent RCA, WMA's on echo consistent with cath anatomy.  Plan was for medical treatment.  Plavix was added for 1 year to aspirin.  Lopressor changed to Toprol.  Diuretics were increased.  Repatha started.   Last encounter with Kerin Ransom, PA 03/28/2020 for follow-up.  He had been doing well since discharge.  O2 sat had been on stable around 92% and using oxygen mainly at night.  Denied any chest pain.  Diastolic heart failure was stable in the office.  PCP had arranged for upgrade to CPAP.  Blood pressure was controlled.  He was continuing on Repatha for hyperlipidemia.  No recent anginal symptoms.   Recently admitted on 11/04/2020 with chest pain / possible NSTEMI that started the previous night in his right chest radiating to the neck  Took 2 nitroglycerin with decrease in pain.  Upon awakening the next morning pain had return and he presented to the emergency department and admitted.  Per Dr. Gwenlyn Found recommended medical therapy only.  Stated if ongoing pain and akinetic anterior wall could consider doing a viability cardiac MRI depending on LV function less than 35%, he may benefit from a LifeVest.  He is here for follow-up today.  Currently denies any  anginal symptoms or nitroglycerin use.  Denies any shortness of breath or DOE.  Denies any lightheadedness, dizziness, near-syncope or syncope.  Denies any PND, orthopnea.  Denies any bleeding issues.  No CVA or TIA-like symptoms.  No claudication-like symptoms. Had a repeat echocardiogram during hospital admission on 11/04/2020 with EF 35 to 40%.  R WMA's consistent with ischemic cardiomyopathy.  Moderate LVH.  Large pericardial effusion posterior to the left ventricle lateral to left ventricular localized near the right atrium no evidence of tamponade.  Similar to size in comparison to prior study in July 2021.  Trivial MR.  Denies any significant weight gain or lower extremity edema.  Past Medical History:  Diagnosis Date  . Anemia   . Anxiety   . Asthma   . Bell palsy   . C. difficile colitis    a. 08/2012  . CAD (coronary artery disease)    a. 05/2011 s/p DES to PDA and RCA. b. 08/2012 Inflat  STEMI/Cath/PCI: LM minor irregs, LAD 50p, D1 50, LCX nl, OM1 25, RCA 30-40p, 100d (treated with 2.75x29mm Promus Premier DES);    [name] Cholelithiasis 07/2012   Asymptomatic; identified incidentally  . Chronic diastolic CHF (congestive heart failure) ([place])   . CKD (chronic kidney disease), stage III ([place])   . Contrast dye induced nephropathy    a. 08/2012 post cath/pci  . COPD (chronic obstructive pulmonary disease) ([place])   . GERD (gastroesophageal reflux disease)   . Gout   . Hyperlipidemia   . Hypertension   . Myocardial infarct ([place]) 09/08/12  .  Nephrolithiasis   . Pericardial effusion    a. s/p window in 2014. b. s/p redo window in 09/2018.  [name] Peripheral neuropathy   . Sleep apnea   . Statin intolerance   . Type 2 diabetes mellitus ([place])     Past Surgical History:  Procedure Laterality Date  . CARDIAC CATHETERIZATION    . CHEST TUBE INSERTION    . CIRCUMCISION    . COLONOSCOPY     In [facility], approximately 2011 per patient, was normal. Advised to come back in 10 years.  .  ESOPHAGOGASTRODUODENOSCOPY     [place] [facility] over 20 yrs ago  . ESOPHAGOGASTRODUODENOSCOPY  06/12/2012   YIF:OYDXAJ esophagus-status post Venia Minks dilation. Abnormal gastric mucosa of uncertain significance-status post biopsy  . EYE SURGERY    . INTRAOPERATIVE TRANSESOPHAGEAL ECHOCARDIOGRAM N/A 06/23/2013   Procedure: INTRAOPERATIVE TRANSESOPHAGEAL ECHOCARDIOGRAM;  Surgeon: Grace Isaac, MD;  Location: [place];  Service: Open Heart Surgery;  Laterality: N/A;  . LEFT HEART CATH AND CORONARY ANGIOGRAPHY N/A 03/14/2020   Procedure: LEFT HEART CATH AND CORONARY ANGIOGRAPHY;  Surgeon: Lorretta Harp, MD;  Location: [facility];  Service: Cardiovascular;  Laterality: N/A;  . LEFT HEART CATHETERIZATION WITH CORONARY ANGIOGRAM N/A 09/08/2012   Procedure: LEFT HEART CATHETERIZATION WITH CORONARY ANGIOGRAM;  Surgeon: [name], MD;  Location: [facility];  Service: Cardiovascular;  Laterality: N/A;  . stents    . SUBXYPHOID PERICARDIAL WINDOW N/A 06/23/2013   Procedure: SUBXYPHOID PERICARDIAL WINDOW;  Surgeon: Grace Isaac, MD;  Location: [place];  Service: Thoracic;  Laterality: N/A;  . SUBXYPHOID PERICARDIAL WINDOW  10/01/2018  . SUBXYPHOID PERICARDIAL WINDOW N/A 10/01/2018   Procedure: REDO SUBXYPHOID PERICARDIAL WINDOW;  Surgeon: Grace Isaac, MD;  Location: [place];  Service: Thoracic;  Laterality: N/A;  . TEE WITHOUT CARDIOVERSION N/A 10/01/2018   Procedure: TRANSESOPHAGEAL ECHOCARDIOGRAM (TEE);  Surgeon: Grace Isaac, MD;  Location: [facility] OR;  Service: Thoracic;  Laterality: N/A;    Current Outpatient Medications  Medication Sig Dispense Refill  . Accu-Chek Softclix Lancets lancets 1 each 3 (three) times daily.    [name] acetaminophen (TYLENOL) 500 MG tablet Take 1,500 mg by mouth every 6 (six) hours as needed for mild pain, fever or headache.     . allopurinol (ZYLOPRIM) 100 MG tablet Take 100 mg by mouth as needed.    . ALPRAZolam (XANAX) 0.5 MG tablet Take 1-2 tablets  (0.5-1 mg total) by mouth at bedtime as needed for sleep. 30 tablet 5  . aspirin 81 MG EC tablet Take 1 tablet (81 mg total) by mouth daily with breakfast. 30 tablet 12  . Cholecalciferol 25 MCG (1000 UT) capsule Take 1 capsule by mouth daily.    . clopidogrel (PLAVIX) 75 MG tablet Take 1 tablet (75 mg total) by mouth daily. 90 tablet 3  . DROPLET PEN NEEDLES 32G X 4 MM MISC USE EVERY DAY AS DIRECTED 100 each 0  . Evolocumab (REPATHA SURECLICK) 287 MG/ML SOAJ Inject 1 Dose into the skin every 14 (fourteen) days. 2 pen 11  . glimepiride (AMARYL) 1 MG tablet TAKE 1 TABLET WITH MEALS AS DIRECTED. MAX 3 TABLETS PER DAY 270 tablet 0  . insulin detemir (LEVEMIR FLEXTOUCH) 100 UNIT/ML FlexPen Inject 60 Units into the skin at bedtime. 30 mL 3  . isosorbide mononitrate (IMDUR) 60 MG 24 hr tablet Take 1 tablet (60 mg total) by mouth daily. 90 tablet 3  . losartan (COZAAR) 25 MG tablet Take 25 mg  by mouth daily.    . metoprolol succinate (TOPROL-XL) 50 MG 24 hr tablet Take 1 tablet (50 mg total) by mouth daily. Take with or immediately following a meal. 90 tablet 3  . nitroGLYCERIN (NITROSTAT) 0.4 MG SL tablet Place 0.4 mg under the tongue every 5 (five) minutes as needed for chest pain.    [name] ofloxacin (FLOXIN) 0.3 % OTIC solution 5 drops as needed.    . pantoprazole (PROTONIX) 40 MG tablet Take 1 tablet (40 mg total) by mouth daily. 30 tablet 3  . torsemide (DEMADEX) 20 MG tablet Take 2 tablets (40 mg total) by mouth daily. 90 tablet 5  . traMADol (ULTRAM) 50 MG tablet TAKE 1 TABLET (50 MG TOTAL) BY MOUTH EVERY 6 (SIX) HOURS AS NEEDED 24 tablet 2   No current facility-administered medications for this visit.   Allergies:  Hydrocodone, Lisinopril, Neurontin [gabapentin], Statins, Metformin and related, and Norvasc [amlodipine besylate]   Social History: The patient  reports that he has never smoked. He has never used smokeless tobacco. He reports that he does not drink alcohol and does not use drugs.    Family History: The patient's family history includes Diabetes in his brother, brother, mother, and sister; Heart attack in his mother; Hypertension in his brother and brother; Sleep apnea in his sister; Stroke in his mother.   ROS:  Please see the history of present illness. Otherwise, complete review of systems is positive for none.  All other systems are reviewed and negative.   Physical Exam: VS:  BP 132/70   Pulse 73   Ht 6\' 1"  (1.854 m)   Wt 257 lb (116.6 kg)   SpO2 96%   BMI 33.91 kg/m , BMI Body mass index is 33.91 kg/m.  Wt Readings from Last 3 Encounters:  11/14/20 257 lb (116.6 kg)  11/04/20 254 lb (115.2 kg)  11/03/20 255 lb (115.7 kg)    General: Obese patient appears comfortable at rest. Neck: Supple, no elevated JVP or carotid bruits, no thyromegaly. Lungs: Clear to auscultation, nonlabored breathing at rest. Cardiac: Irregularly irregular rate and rhythm, no S3 or significant systolic murmur, no pericardial rub. Extremities: No pitting edema, distal pulses 2+. Skin: Warm and dry. Musculoskeletal: No kyphosis. Neuropsychiatric: Alert and oriented x3, affect grossly appropriate.  ECG: 11/14/2020 EKG shows sinus rhythm with first-degree AV block rate of 71, rightward axis, incomplete RBBB, septal infarct age undetermined..  Recent Labwork: 03/13/2020: B Natriuretic Peptide 322.4 08/02/2020: ALT 9; AST 15 11/06/2020: BUN 52; Creatinine, Ser 2.12; Hemoglobin 9.7; Platelets 163; Potassium 3.9; Sodium 135     Component Value Date/Time   CHOL 124 04/15/2020 0943   TRIG 289 (H) 04/15/2020 0943   HDL 33 (L) 04/15/2020 0943   CHOLHDL 3.8 04/15/2020 0943   CHOLHDL 9.8 11/10/2015 0521   VLDL UNABLE TO CALCULATE IF TRIGLYCERIDE OVER 400 mg/dL 11/10/2015 0521   LDLCALC 47 04/15/2020 0943    Other Studies Reviewed Today:   Echocardiogram 11/04/2020  1. Left ventricular ejection fraction, by estimation, is 35 to 40%. The left ventricle has moderately decreased  function. The left ventricle demonstrates regional wall motion abnormalities (see scoring diagram/findings for description) consistent with ischemic cardiomyopathy. There is moderate left ventricular hypertrophy. Left ventricular diastolic parameters are indeterminate. No formed LV mural thrombus noted with Definity contrast. 2. Right ventricular systolic function is normal. The right ventricular size is normal. Tricuspid regurgitation signal is inadequate for assessing PA pressure. 3. Large pericardial effusion. The pericardial effusion is posterior to the  left ventricle, lateral to the left ventricle and localized near the right atrium. There is no clear evidence of cardiac tamponade. Overall similar in size to prior study in July 2021. 4. The mitral valve is abnormal. Trivial mitral valve regurgitation. Moderate mitral annular calcification. 5. The aortic valve is tricuspid. Aortic valve regurgitation is not visualized. 6. The inferior vena cava is normal in size with greater than 50% respiratory variability, suggesting right atrial pressure of 3 mmHg.  Echocardiogram 03/14/2020  1. Left ventricular ejection fraction, by estimation, is 40 to 45%. The left ventricle has mildly decreased function. The left ventricle demonstrates regional wall motion abnormalities (see scoring diagram/findings for description). There is moderate concentric left ventricular hypertrophy. Left ventricular diastolic parameters are consistent with Grade III diastolic dysfunction (restrictive). 2. Right ventricular systolic function is normal. The right ventricular size is normal. There is mildly elevated pulmonary artery systolic pressure. 3. Left atrial size was moderately dilated. 4. Right atrial size was mildly dilated. 5. Largest dimension 3.4 cm posterior to RA without evidence of tamponade.. Large pericardial effusion. The pericardial effusion is circumferential. There is no evidence of  cardiac tamponade. 6. The mitral valve is normal in structure. Trivial mitral valve regurgitation. No evidence of mitral stenosis. 7. The aortic valve is tricuspid. Aortic valve regurgitation is not visualized. No aortic stenosis is present. 8. The inferior vena cava is dilated in size with <50% respiratory variability, suggesting right atrial pressure of 15 mmHg. Comparison(s): Changes from prior study are noted.  03/14/2020 LEFT HEART CATH AND CORONARY ANGIOGRAPHY   Conclusion    Previously placed Mid RCA to Dist RCA stent (unknown type) is widely patent.  Prox Cx lesion is 50% stenosed.  Mid RCA lesion is 40% stenosed.  Mid LAD-1 lesion is 50% stenosed.  Mid LAD-2 lesion is 100% stenosed with 100% stenosed side branch in 2nd Sept.  Mid LAD-3 lesion is 100% stenosed.   Diagnostic Dominance: Right       Assessment and Plan:  1. Mixed hyperlipidemia   2. CAD in native artery   3. Chronic combined systolic (congestive) and diastolic (congestive) heart failure ([place])   4. Pericardial effusion   5. Right carotid bruit    1. Mixed hyperlipidemia Continue Repatha 140 mg subcu q. 14 days.  Last lipid panel 04/15/2020: TC 124, TG 289, HDL 33, LDL 47.  2. CAD in native artery/NSTEMI Recent NSTEMI 11/04/2020.  Currently denies any anginal symptoms or nitroglycerin use.  Continue aspirin 81 mg daily, Plavix 75 mg daily, Imdur 60 mg daily, nitroglycerin sublingual as needed.  Cardiac catheterization July 2021 demonstrated diffusely diseased LAD.  Dr. Gwenlyn Found commented given the fact that there were no collaterals, he suspected no viable myocardium beyond  occluded mid LAD and therefore no benefit will be derived from opening the diffusely diseased vessel.  3. Chronic combined systolic and diastolic heart failure ([place]) Echocardiogram 11/04/2020 during recent hospitalization showed EF 35 to 40%.  Positive R WMA's consistent with ischemic cardiomyopathy.  Moderate LVH.  Large  pericardial effusion.  No evidence of tamponade.  Similar in size to her prior study in July 2021.  Trivial MR. Continue Toprol-XL 50 mg daily.  Continue losartan 25 mg daily.  Continue torsemide 40 mg daily.  Continue Imdur 60 mg daily.   Medication Adjustments/Labs and Tests Ordered: Current medicines are reviewed at length with the patient today.  Concerns regarding medicines are outlined above.   Disposition: Keep follow-up appointment with Dr. [name] on April 20, 2021  Signed,  Levell July, NP 11/14/2020 2:47 PM    [facility] at [place], [place], [place] [address] Phone: [phone]; Fax: [phone]

## 2020-11-14 ENCOUNTER — Ambulatory Visit: Payer: Medicare HMO | Admitting: Family Medicine

## 2020-11-14 ENCOUNTER — Encounter: Payer: Self-pay | Admitting: Family Medicine

## 2020-11-14 VITALS — BP 132/70 | HR 73 | Ht 73.0 in | Wt 257.0 lb

## 2020-11-14 DIAGNOSIS — I313 Pericardial effusion (noninflammatory): Secondary | ICD-10-CM | POA: Diagnosis not present

## 2020-11-14 DIAGNOSIS — I2510 Atherosclerotic heart disease of native coronary artery without angina pectoris: Secondary | ICD-10-CM | POA: Diagnosis not present

## 2020-11-14 DIAGNOSIS — E782 Mixed hyperlipidemia: Secondary | ICD-10-CM | POA: Diagnosis not present

## 2020-11-14 DIAGNOSIS — R0989 Other specified symptoms and signs involving the circulatory and respiratory systems: Secondary | ICD-10-CM | POA: Diagnosis not present

## 2020-11-14 DIAGNOSIS — I5042 Chronic combined systolic (congestive) and diastolic (congestive) heart failure: Secondary | ICD-10-CM

## 2020-11-14 DIAGNOSIS — I3139 Other pericardial effusion (noninflammatory): Secondary | ICD-10-CM

## 2020-11-14 MED FILL — Perflutren Lipid Microsphere IV Susp 1.1 MG/ML: INTRAVENOUS | Qty: 10 | Status: AC

## 2020-11-14 NOTE — Patient Instructions (Signed)
Medication Instructions:  Continue all current medications.  Labwork: none  Testing/Procedures: none  Follow-Up: Keep appointment already scheduled with Dr. [name].   Any Other Special Instructions Will Be Listed Below (If Applicable).  If you need a refill on your cardiac medications before your next appointment, please call your pharmacy.

## 2020-11-15 ENCOUNTER — Other Ambulatory Visit: Payer: Self-pay

## 2020-11-15 ENCOUNTER — Encounter: Payer: Self-pay | Admitting: Podiatry

## 2020-11-15 ENCOUNTER — Ambulatory Visit: Payer: Medicare HMO | Admitting: Podiatry

## 2020-11-15 DIAGNOSIS — B351 Tinea unguium: Secondary | ICD-10-CM

## 2020-11-15 DIAGNOSIS — E1142 Type 2 diabetes mellitus with diabetic polyneuropathy: Secondary | ICD-10-CM

## 2020-11-15 DIAGNOSIS — M79674 Pain in right toe(s): Secondary | ICD-10-CM | POA: Diagnosis not present

## 2020-11-15 DIAGNOSIS — M2142 Flat foot [pes planus] (acquired), left foot: Secondary | ICD-10-CM

## 2020-11-15 DIAGNOSIS — M79675 Pain in left toe(s): Secondary | ICD-10-CM | POA: Diagnosis not present

## 2020-11-15 DIAGNOSIS — S91311A Laceration without foreign body, right foot, initial encounter: Secondary | ICD-10-CM

## 2020-11-15 DIAGNOSIS — M2141 Flat foot [pes planus] (acquired), right foot: Secondary | ICD-10-CM

## 2020-11-15 DIAGNOSIS — M2042 Other hammer toe(s) (acquired), left foot: Secondary | ICD-10-CM

## 2020-11-15 DIAGNOSIS — M2041 Other hammer toe(s) (acquired), right foot: Secondary | ICD-10-CM

## 2020-11-16 ENCOUNTER — Other Ambulatory Visit: Payer: Self-pay | Admitting: Family Medicine

## 2020-11-16 ENCOUNTER — Other Ambulatory Visit: Payer: Self-pay

## 2020-11-16 MED ORDER — OFLOXACIN 0.3 % OT SOLN: 5.0000 [drp] | OTIC | 1 refills | Status: AC | PRN

## 2020-11-16 NOTE — Telephone Encounter (Signed)
Pt needs ear drop that he took back in 2020 said it was for diabetics can you check which one it was he wants to know if he could get a refill?   Pt call back [phone]

## 2020-11-18 DIAGNOSIS — I509 Heart failure, unspecified: Secondary | ICD-10-CM | POA: Diagnosis not present

## 2020-11-18 DIAGNOSIS — G4733 Obstructive sleep apnea (adult) (pediatric): Secondary | ICD-10-CM | POA: Diagnosis not present

## 2020-11-18 DIAGNOSIS — I5033 Acute on chronic diastolic (congestive) heart failure: Secondary | ICD-10-CM | POA: Diagnosis not present

## 2020-11-18 DIAGNOSIS — I5023 Acute on chronic systolic (congestive) heart failure: Secondary | ICD-10-CM | POA: Diagnosis not present

## 2020-11-20 NOTE — Progress Notes (Signed)
Subjective: Eugene [name] presents today at risk foot care. Pt has h/o NIDDM with chronic kidney disease and painful mycotic nails b/l that are difficult to trim. Pain interferes with ambulation. Aggravating factors include wearing enclosed shoe gear. Pain is relieved with periodic professional debridement.   He relates his blood glucose was 127 mg/dl today. He states he may have scraped the bottom of his feet. His wife has been applying antibiotic ointment on it. He has neuropathy and cannot feel any trauma.  He states he had another MI earlier this month and his heart function is poor.  [name], MD is patient's PCP. Last visit was: 10/13/2020.  Past Medical History:  Diagnosis Date  . Anemia   . Anxiety   . Asthma   . Bell palsy   . C. difficile colitis    a. 08/2012  . CAD (coronary artery disease)    a. 05/2011 s/p DES to PDA and RCA. b. 08/2012 Inflat  STEMI/Cath/PCI: LM minor irregs, LAD 50p, D1 50, LCX nl, OM1 25, RCA 30-40p, 100d (treated with 2.75x13mm Promus Premier DES);    [name] Cholelithiasis 07/2012   Asymptomatic; identified incidentally  . Chronic diastolic CHF (congestive heart failure) ([place])   . CKD (chronic kidney disease), stage III ([place])   . Contrast dye induced nephropathy    a. 08/2012 post cath/pci  . COPD (chronic obstructive pulmonary disease) ([place])   . GERD (gastroesophageal reflux disease)   . Gout   . Hyperlipidemia   . Hypertension   . Myocardial infarct ([place]) 09/08/12  . Nephrolithiasis   . Pericardial effusion    a. s/p window in 2014. b. s/p redo window in 09/2018.  [name] Peripheral neuropathy   . Sleep apnea   . Statin intolerance   . Type 2 diabetes mellitus ([place])      Current Outpatient Medications on File Prior to Visit  Medication Sig Dispense Refill  . Accu-Chek Softclix Lancets lancets 1 each 3 (three) times daily.    [name] acetaminophen (TYLENOL) 500 MG tablet Take 1,500 mg by mouth every 6 (six) hours as needed for mild pain, fever or  headache.     . allopurinol (ZYLOPRIM) 100 MG tablet Take 100 mg by mouth as needed.    . ALPRAZolam (XANAX) 0.5 MG tablet Take 1-2 tablets (0.5-1 mg total) by mouth at bedtime as needed for sleep. 30 tablet 5  . aspirin 81 MG EC tablet Take 1 tablet (81 mg total) by mouth daily with breakfast. 30 tablet 12  . Cholecalciferol 25 MCG (1000 UT) capsule Take 1 capsule by mouth daily.    . clopidogrel (PLAVIX) 75 MG tablet Take 1 tablet (75 mg total) by mouth daily. 90 tablet 3  . DROPLET PEN NEEDLES 32G X 4 MM MISC USE EVERY DAY AS DIRECTED 100 each 0  . Evolocumab (REPATHA SURECLICK) 824 MG/ML SOAJ Inject 1 Dose into the skin every 14 (fourteen) days. 2 pen 11  . insulin detemir (LEVEMIR FLEXTOUCH) 100 UNIT/ML FlexPen Inject 60 Units into the skin at bedtime. 30 mL 3  . isosorbide mononitrate (IMDUR) 60 MG 24 hr tablet Take 1 tablet (60 mg total) by mouth daily. 90 tablet 3  . losartan (COZAAR) 25 MG tablet Take 25 mg by mouth daily.    . metoprolol succinate (TOPROL-XL) 50 MG 24 hr tablet Take 1 tablet (50 mg total) by mouth daily. Take with or immediately following a meal. 90 tablet 3  . nitroGLYCERIN (NITROSTAT) 0.4 MG SL tablet  Place 0.4 mg under the tongue every 5 (five) minutes as needed for chest pain.    . pantoprazole (PROTONIX) 40 MG tablet Take 1 tablet (40 mg total) by mouth daily. 30 tablet 3  . torsemide (DEMADEX) 20 MG tablet Take 2 tablets (40 mg total) by mouth daily. 90 tablet 5  . traMADol (ULTRAM) 50 MG tablet TAKE 1 TABLET (50 MG TOTAL) BY MOUTH EVERY 6 (SIX) HOURS AS NEEDED 24 tablet 2   No current facility-administered medications on file prior to visit.     Allergies  Allergen Reactions  . Hydrocodone Nausea And Vomiting  . Lisinopril Cough  . Neurontin [Gabapentin] Other (See Comments)    Reaction:  Suicidal thoughts   . Statins Other (See Comments)    Reaction:  Muscle pain   . Metformin And Related Diarrhea  . Norvasc [Amlodipine Besylate] Swelling and Other (See  Comments)    Reaction:  Pedal edema    Objective: Eugene [name] is a pleasant 65 y.o. Caucasian male, morbidly obese, in NAD. AAO x 3.  There were no vitals filed for this visit.  Vascular Examination: Neurovascular status unchanged b/l lower extremities. Capillary refill time to digits immediate b/l. Palpable DP pulses b/l. Palpable PT pulses b/l. Pedal hair sparse b/l. Skin temperature gradient within normal limits b/l.  Dermatological Examination: Pedal skin with normal turgor, texture and tone bilaterally. No open wounds bilaterally. No interdigital macerations bilaterally. Toenails 1-5 left, 2-5 right elongated, discolored, dystrophic, thickened, crumbly with subungual debris and tenderness to dorsal palpation.   Superficial skin tear noted plantarlateral aspect of right foot. No surrounding erythema, no edema, no drainage, no fluctuance. No abscess nor suspicion of foreign body.  Musculoskeletal: Normal muscle strength 5/5 to all lower extremity muscle groups bilaterally. No pain crepitus or joint limitation noted with ROM b/l. Hammertoes noted to the 2-5 bilaterally. Pes planus deformity noted b/l.   Neurological Examination: Protective sensation diminished with 10g monofilament b/l. Vibratory sensation diminished b/l. Proprioception intact bilaterally.  Assessment: 1. Pain due to onychomycosis of toenails of both feet   2. Tear of skin of plantar aspect of foot, right, initial encounter   3. Pes planus of both feet   4. Acquired hammertoes of both feet   5. Diabetic polyneuropathy associated with type 2 diabetes mellitus ([place])     Plan: -Examined patient. -Continue diabetic foot care principles. -Toenails 1-5 left, 2-5 rightl were debrided in length and girth with sterile nail nippers and dremel without iatrogenic bleeding.  -Cleansed skin tear plantarlateral aspect of right foot. Applied triple antibiotic ointment and band-aid. He is to apply Neosporin to area once  daily for one week. Call office if he has any problems. -Patient to continue soft, supportive shoe gear daily. -Patient to report any pedal injuries to medical professional immediately. -Patient/POA to call should there be question/concern in the interim.  Return in about 3 months (around 02/15/2021).  Marzetta Board, DPM

## 2020-11-24 ENCOUNTER — Ambulatory Visit: Payer: Medicare HMO | Admitting: Nurse Practitioner

## 2020-11-24 DIAGNOSIS — G4733 Obstructive sleep apnea (adult) (pediatric): Secondary | ICD-10-CM | POA: Diagnosis not present

## 2020-11-25 ENCOUNTER — Ambulatory Visit: Payer: Medicare HMO | Admitting: Family Medicine

## 2020-12-05 ENCOUNTER — Encounter: Payer: Self-pay | Admitting: Gastroenterology

## 2020-12-05 NOTE — Progress Notes (Signed)
Subjective:   Eugene [name] is a 65 y.o. male who presents for Medicare Annual/Subsequent preventive examination.  I connected with Akshith Moncus today by telephone and verified that I am speaking with the correct person using two identifiers. Location patient: home Location provider: work Persons participating in the virtual visit: patient, provider.   I discussed the limitations, risks, security and privacy concerns of performing an evaluation and management service by telephone and the availability of in person appointments. I also discussed with the patient that there may be a patient responsible charge related to this service. The patient expressed understanding and verbally consented to this telephonic visit.    Interactive audio and video telecommunications were attempted between this provider and patient, however failed, due to patient having technical difficulties OR patient did not have access to video capability.  We continued and completed visit with audio only.      Review of Systems    N/A  Cardiac Risk Factors include: advanced age (>60mn, >>36women);male gender;diabetes mellitus;hypertension;dyslipidemia;obesity (BMI >30kg/m2)     Objective:    Today's Vitals   There is no height or weight on file to calculate BMI.  Advanced Directives 12/06/2020 11/04/2020 11/04/2020 05/30/2020 03/22/2020 03/12/2020 03/12/2020  Does Patient Have a Medical Advance Directive? No No No No No No No  Type of Advance Directive - - - - - - -  Does patient want to make changes to medical advance directive? - - - - - - -  [place]? - - - - - - -  Would patient like information on creating a medical advance directive? No - Patient declined No - Patient declined No - Patient declined - No - Patient declined No - Patient declined -  Pre-existing out of facility DNR order (yellow form or pink MOST form) - - - - - - -    Current Medications  (verified) Outpatient Encounter Medications as of 12/06/2020  Medication Sig  . Accu-Chek Softclix Lancets lancets 1 each 3 (three) times daily.  .Marland Kitchenacetaminophen (TYLENOL) 500 MG tablet Take 1,500 mg by mouth every 6 (six) hours as needed for mild pain, fever or headache.   . Alcohol Swabs (B-D SINGLE USE SWABS REGULAR) PADS SMARTSIG:1 Pledget(s) Topical 3 Times Daily  . allopurinol (ZYLOPRIM) 100 MG tablet Take 100 mg by mouth as needed.  . ALPRAZolam (XANAX) 0.5 MG tablet Take 1-2 tablets (0.5-1 mg total) by mouth at bedtime as needed for sleep.  .Marland Kitchenaspirin 81 MG EC tablet Take 1 tablet (81 mg total) by mouth daily with breakfast.  . Blood Glucose Calibration (ACCU-CHEK GUIDE CONTROL) LIQD   . Blood Glucose Monitoring Suppl (ACCU-CHEK GUIDE) w/Device KIT   . Cholecalciferol 25 MCG (1000 UT) capsule Take 1 capsule by mouth daily.  . clopidogrel (PLAVIX) 75 MG tablet Take 1 tablet (75 mg total) by mouth daily.  . DROPLET PEN NEEDLES 32G X 4 MM MISC USE EVERY DAY AS DIRECTED  . glimepiride (AMARYL) 1 MG tablet TAKE 1 TABLET WITH MEALS AS DIRECTED. MAX 3 TABLETS PER DAY  . insulin detemir (LEVEMIR FLEXTOUCH) 100 UNIT/ML FlexPen Inject 60 Units into the skin at bedtime.  . isosorbide mononitrate (IMDUR) 60 MG 24 hr tablet Take 1 tablet (60 mg total) by mouth daily.  .Marland Kitchenlosartan (COZAAR) 25 MG tablet Take 25 mg by mouth daily.  . metoprolol succinate (TOPROL-XL) 50 MG 24 hr tablet Take 1 tablet (50 mg total) by mouth daily.  Take with or immediately following a meal.  . ofloxacin (FLOXIN) 0.3 % OTIC solution Place 5 drops into both ears as needed.  . pantoprazole (PROTONIX) 40 MG tablet Take 1 tablet (40 mg total) by mouth daily.  [name] torsemide (DEMADEX) 20 MG tablet Take 2 tablets (40 mg total) by mouth daily.  . traMADol (ULTRAM) 50 MG tablet TAKE 1 TABLET (50 MG TOTAL) BY MOUTH EVERY 6 (SIX) HOURS AS NEEDED  . Evolocumab (REPATHA SURECLICK) 299 MG/ML SOAJ Inject 1 Dose into the skin every 14  (fourteen) days. (Patient not taking: Reported on 12/06/2020)  . nitroGLYCERIN (NITROSTAT) 0.4 MG SL tablet Place 0.4 mg under the tongue every 5 (five) minutes as needed for chest pain. (Patient not taking: Reported on 12/06/2020)   No facility-administered encounter medications on file as of 12/06/2020.    Allergies (verified) Hydrocodone, Lisinopril, Neurontin [gabapentin], Statins, Metformin and related, and Norvasc [amlodipine besylate]   History: Past Medical History:  Diagnosis Date  . Anemia   . Anxiety   . Asthma   . Bell palsy   . C. difficile colitis    a. 08/2012  . CAD (coronary artery disease)    a. 05/2011 s/p DES to PDA and RCA. b. 08/2012 Inflat  STEMI/Cath/PCI: LM minor irregs, LAD 50p, D1 50, LCX nl, OM1 25, RCA 30-40p, 100d (treated with 2.75x40m Promus Premier DES);    .Marland KitchenCholelithiasis 07/2012   Asymptomatic; identified incidentally  . Chronic diastolic CHF (congestive heart failure) ([place]   . CKD (chronic kidney disease), stage III ([facility] [place]   . Contrast dye induced nephropathy    a. 08/2012 post cath/pci  . COPD (chronic obstructive pulmonary disease) ([place]   . GERD (gastroesophageal reflux disease)   . Gout   . Hyperlipidemia   . Hypertension   . Myocardial infarct ([facility] 09/08/12  . Nephrolithiasis   . Pericardial effusion    a. s/p window in 2014. b. s/p redo window in 09/2018.  .Marland KitchenPeripheral neuropathy   . Sleep apnea   . Statin intolerance   . Type 2 diabetes mellitus ([facility]    Past Surgical History:  Procedure Laterality Date  . CARDIAC CATHETERIZATION    . CHEST TUBE INSERTION    . CIRCUMCISION    . [facility]     [facility] normal. Advised to come back in 10 years.  . ESOPHAGOGASTRODUODENOSCOPY     [place] 20 yrs ago  . ESOPHAGOGASTRODUODENOSCOPY  06/12/2012   RMEQ:ASTMHDesophagus-status post MVenia Minksdilation. Abnormal gastric mucosa of uncertain significance-status post biopsy  . EYE  SURGERY    . INTRAOPERATIVE TRANSESOPHAGEAL ECHOCARDIOGRAM N/A 06/23/2013   Procedure: INTRAOPERATIVE TRANSESOPHAGEAL ECHOCARDIOGRAM;  Surgeon: EGrace Isaac MD;  [place]: [place]  Service: Open Heart Surgery;  Laterality: N/A;  . LEFT HEART CATH AND CORONARY ANGIOGRAPHY N/A 03/14/2020   Procedure: LEFT HEART CATH AND CORONARY ANGIOGRAPHY;  Surgeon: BLorretta Harp MD;  [facility];  Service: Cardiovascular;  Laterality: N/A;  . LEFT HEART CATHETERIZATION WITH CORONARY ANGIOGRAM N/A 09/08/2012   Procedure: LEFT HEART CATHETERIZATION WITH CORONARY ANGIOGRAM;  Surgeon: MSherren Mocha MD;  [facility];  Service: Cardiovascular;  Laterality: N/A;  . stents    . SUBXYPHOID PERICARDIAL WINDOW N/A 06/23/2013   Procedure: SUBXYPHOID PERICARDIAL WINDOW;  Surgeon: EGrace Isaac MD;  [place]  Service: Thoracic;  Laterality: N/A;  . SUBXYPHOID PERICARDIAL WINDOW  10/01/2018  . SUBXYPHOID PERICARDIAL WINDOW N/A  10/01/2018   Procedure: REDO SUBXYPHOID PERICARDIAL WINDOW;  Surgeon: Grace Isaac, MD;  Location: [place];  Service: Thoracic;  Laterality: N/A;  . TEE WITHOUT CARDIOVERSION N/A 10/01/2018   Procedure: TRANSESOPHAGEAL ECHOCARDIOGRAM (TEE);  Surgeon: Grace Isaac, MD;  Location: [facility] OR;  Service: Thoracic;  Laterality: N/A;   Family History  Problem Relation Age of Onset  . Diabetes Mother   . Heart attack Mother   . Stroke Mother   . Diabetes Sister   . Sleep apnea Sister   . Hypertension Brother   . Diabetes Brother   . Diabetes Brother   . Hypertension Brother   . Colon cancer Neg Hx   . Liver disease Neg Hx    Social History   Socioeconomic History  . Marital status: Married    Spouse name: Not on file  . Number of children: 2  . Years of education: Not on file  . Highest education level: Not on file  Occupational History  . 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  Smoking status: Never Smoker  . Smokeless tobacco: Never Used  Vaping Use  . Vaping Use: Never used  Substance and Sexual Activity  . Alcohol use: No    Alcohol/week: 0.0 standard drinks    Comment: heavy etoh use 30 years ago  . Drug use: No  . Sexual activity: Yes    Birth control/protection: None  Other Topics Concern  . 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 Disabled at this point.   Social Determinants of Health   Financial Resource Strain: Low Risk   . Difficulty of Paying Living Expenses: Not hard at all  Food Insecurity: No Food Insecurity  . Worried About Charity fundraiser in the Last Year: Never true  . Ran Out of Food in the Last Year: Never true  Transportation Needs: No Transportation Needs  . Lack of Transportation (Medical): No  . Lack of Transportation (Non-Medical): No  Physical Activity: Inactive  . Days of Exercise per Week: 0 days  . Minutes of Exercise per Session: 0 min  Stress: No Stress Concern Present  . Feeling of Stress : Not at all  Social Connections: Moderately Isolated  . Frequency of Communication with Friends and Family: More than three times a week  . Frequency of Social Gatherings with Friends and Family: Three times a week  . Attends Religious Services: Never  . Active Member of Clubs or Organizations: No  . Attends Archivist Meetings: Never  . Marital Status: Married    Tobacco Counseling Counseling given: Not Answered   Clinical Intake:  Pre-visit preparation completed: Yes  Pain : No/denies pain     Nutritional Risks: None Diabetes: Yes CBG done?: No Did pt. bring in CBG monitor from home?: No  How often do you need to have someone help you when you read instructions, pamphlets, or other written materials from your doctor or pharmacy?: 1 - Never  Diabetic?Yes Nutrition Risk Assessment:  Has the patient had any N/V/D within the last 2 months?  No   Does the patient have any non-healing wounds?  No  Has the patient had any unintentional weight loss or weight gain?  No   Diabetes:  Is the patient diabetic?  Yes  If diabetic, was a CBG obtained today?  No  Did the patient bring in  their glucometer from home?  No  How often do you monitor your CBG's? Checks glucose every morning.   Financial Strains and Diabetes Management:  Are you having any financial strains with the device, your supplies or your medication? No .  Does the patient want to be seen by Chronic Care Management for management of their diabetes?  No  Would the patient like to be referred to a Nutritionist or for Diabetic Management?  No   Diabetic Exams:  Diabetic Eye Exam: Completed 10/10/2020 Diabetic Foot Exam: Overdue, Pt has been advised about the importance in completing this exam. Pt is scheduled for diabetic foot exam on 12/28/2020.   Interpreter Needed?: No  Information entered by :: [place] of Daily Living In your present state of health, do you have any difficulty performing the following activities: 12/06/2020 11/04/2020  Hearing? N N  Vision? N N  Difficulty concentrating or making decisions? N N  Walking or climbing stairs? Y N  Comment has leg pain and weakness -  Dressing or bathing? N N  Doing errands, shopping? N N  Preparing Food and eating ? N -  Using the Toilet? N -  In the past six months, have you accidently leaked urine? N -  Do you have problems with loss of bowel control? N -  Managing your Medications? N -  Managing your Finances? N -  Housekeeping or managing your Housekeeping? N -  Some recent data might be hidden    Patient Care Team: [name], MD as PCP - General (Family Medicine) Branch, Alphonse Guild, MD as PCP - Cardiology (Cardiology) Gala Romney [name] Estimable, MD as Consulting Physician (Gastroenterology)  Indicate any recent Medical Services you may have received from other than [facility] providers in the  past year (date may be approximate).     Assessment:   This is a routine wellness examination for Stuart.  Hearing/Vision screen  Hearing Screening   '125Hz'  '250Hz'  '500Hz'  '1000Hz'  '2000Hz'  '3000Hz'  '4000Hz'  '6000Hz'  '8000Hz'   Right ear:           Left ear:           Vision Screening Comments: Patient states gets eye examined every 10 weeks with eye injection. Wears  reading glasses   Dietary issues and exercise activities discussed: Current Exercise Habits: The patient does not participate in regular exercise at present, Exercise limited by: cardiac condition(s)  Goals    . HEMOGLOBIN A1C < 7      Depression Screen PHQ 2/9 Scores 12/06/2020 08/02/2020 03/22/2020 03/10/2020 12/13/2016 03/02/2016 03/01/2016  PHQ - 2 Score 0 0 1 6 0 0 0  PHQ- 9 Score - - - 18 - - -    Fall Risk Fall Risk  12/06/2020 03/10/2020 09/11/2019 09/30/2018 12/13/2016  Falls in the past year? 0 0 0 0 No  Number falls in past yr: 0 - - 0 -  Injury with Fall? 0 - - 0 -  Risk for fall due to : No Fall Risks Impaired balance/gait;Impaired mobility Impaired balance/gait - -  Follow up Falls evaluation completed;Falls prevention discussed Falls evaluation completed Falls evaluation completed Falls evaluation completed -    FALL RISK PREVENTION PERTAINING TO THE HOME:  Any stairs in or around the home? No  If so, are there any without handrails? No  Home free of loose throw rugs in walkways, pet beds, electrical cords, etc? Yes  Adequate lighting in your home to reduce risk of falls? Yes   ASSISTIVE  DEVICES UTILIZED TO PREVENT FALLS:  Life alert? No  Use of a cane, walker or w/c? No  Grab bars in the bathroom? No  Shower chair or bench in shower? No  Elevated toilet seat or a handicapped toilet? Yes     Cognitive Function:   Normal cognitive status assessed by direct observation by this Nurse Health Advisor. No abnormalities found.        Immunizations Immunization History  Administered Date(s) Administered  . Influenza  Inj Mdck Quad Pf 05/26/2019  . Influenza Split 05/10/2012  . Influenza, Seasonal, Injecte, Preservative Fre 05/11/2016  . Influenza,inj,Quad PF,6+ Mos 05/21/2013, 06/15/2014, 06/16/2018  . Influenza,inj,quad, With Preservative 05/29/2017  . Influenza-Unspecified 06/27/2002, 06/15/2017, 06/11/2019, 06/15/2020  . Moderna SARS-COV2 Booster Vaccination 08/08/2020  . Moderna Sars-Covid-2 Vaccination 11/03/2019, 12/04/2019  . PPD Test 06/19/2013  . Pneumococcal Polysaccharide-23 11/27/2017  . Pneumococcal-Unspecified 09/08/2012  . Td 01/29/2014    TDAP status: Up to date  Flu Vaccine status: Up to date  Pneumococcal vaccine status: Up to date  Covid-19 vaccine status: Completed vaccines  Qualifies for Shingles Vaccine? Yes   Zostavax completed No   Shingrix Completed?: No.    Education has been provided regarding the importance of this vaccine. Patient has been advised to call insurance company to determine out of pocket expense if they have not yet received this vaccine. Advised may also receive vaccine at local pharmacy or Health Dept. Verbalized acceptance and understanding.  Screening Tests Health Maintenance  Topic Date Due  . COLONOSCOPY (Pts 45-71yr Insurance coverage will need to be confirmed)  06/26/2020  . FOOT EXAM  09/10/2020  . INFLUENZA VACCINE  03/27/2021  . HEMOGLOBIN A1C  05/07/2021  . OPHTHALMOLOGY EXAM  10/10/2021  . TETANUS/TDAP  01/30/2024  . PNEUMOCOCCAL POLYSACCHARIDE VACCINE AGE 7-64 HIGH RISK  Completed  . COVID-19 Vaccine  Completed  . Hepatitis C Screening  Completed  . HIV Screening  Completed  . HPV VACCINES  Aged Out    Health Maintenance  Health Maintenance Due  Topic Date Due  . COLONOSCOPY (Pts 45-448yrInsurance coverage will need to be confirmed)  06/26/2020  . FOOT EXAM  09/10/2020    Colorectal cancer screening: Type of screening: Colonoscopy. Completed 06/26/2010. Repeat every 10 years  Lung Cancer Screening: (Low Dose CT Chest  recommended if Age 64-80ears, 30 pack-year currently smoking OR have quit w/in 15years.) does not qualify.   Lung Cancer Screening Referral: N/A   Additional Screening:  Hepatitis C Screening: does qualify;   Vision Screening: Recommended annual ophthalmology exams for early detection of glaucoma and other disorders of the eye. Is the patient up to date with their annual eye exam?  Yes  Who is the provider or what is the name of the office in which the patient attends annual eye exams? Dr. JoTempie Hoistf pt is not established with a provider, would they like to be referred to a provider to establish care? No .   Dental Screening: Recommended annual dental exams for proper oral hygiene  Community Resource Referral / Chronic Care Management: CRR required this visit?  No   CCM required this visit?  No      Plan:     I have personally reviewed and noted the following in the patient's chart:   . Medical and social history . Use of alcohol, tobacco or illicit drugs  . Current medications and supplements . Functional ability and status . Nutritional status . Physical activity . Advanced directives . List  of other physicians . Hospitalizations, surgeries, and ER visits in previous 12 months . Vitals . Screenings to include cognitive, depression, and falls . Referrals and appointments  In addition, I have reviewed and discussed with patient certain preventive protocols, quality metrics, and best practice recommendations. A written personalized care plan for preventive services as well as general preventive health recommendations were provided to patient.     Ofilia Neas, LPN   [id number]   Nurse Notes: None

## 2020-12-06 ENCOUNTER — Ambulatory Visit (INDEPENDENT_AMBULATORY_CARE_PROVIDER_SITE_OTHER): Payer: Medicare HMO

## 2020-12-06 DIAGNOSIS — Z0000 Encounter for general adult medical examination without abnormal findings: Secondary | ICD-10-CM | POA: Diagnosis not present

## 2020-12-06 NOTE — Patient Instructions (Signed)
Eugene [name] , Thank you for taking time to come for your Medicare Wellness Visit. I appreciate your ongoing commitment to your health goals. Please review the following plan we discussed and let me know if I can assist you in the future.   Screening recommendations/referrals: Colonoscopy: Currently due, please keep Gastroenterology consult on 02-07-2021 Recommended yearly ophthalmology/optometry visit for glaucoma screening and checkup Recommended yearly dental visit for hygiene and checkup  Vaccinations: Influenza vaccine: Up to date, next due fall 2022  Pneumococcal vaccine:Up to date, next due at age 52  Tdap vaccine: Up to date, next due 01/29/2014 Shingles vaccine: Currently due, if you would like to receive we recommend that you do so at your local pharmacy as it will be less expensive.     Advanced directives: Advance directive discussed with you today. Even though you declined this today please call our office should you change your mind and we can give you the proper paperwork for you to fill out.   Conditions/risks identified: None   Next appointment: 12/28/2020 @ 1:10 PM with Dr. [name]  Preventive Care 65 Years and Older, Male Preventive care refers to lifestyle choices and visits with your health care provider that can promote health and wellness. What does preventive care include?  A yearly physical exam. This is also called an annual well check.  Dental exams once or twice a year.  Routine eye exams. Ask your health care provider how often you should have your eyes checked.  Personal lifestyle choices, including:  Daily care of your teeth and gums.  Regular physical activity.  Eating a healthy diet.  Avoiding tobacco and drug use.  Limiting alcohol use.  Practicing safe sex.  Taking low doses of aspirin every day.  Taking vitamin and mineral supplements as recommended by your health care provider. What happens during an annual well check? The  services and screenings done by your health care provider during your annual well check will depend on your age, overall health, lifestyle risk factors, and family history of disease. Counseling  Your health care provider may ask you questions about your:  Alcohol use.  Tobacco use.  Drug use.  Emotional well-being.  Home and relationship well-being.  Sexual activity.  Eating habits.  History of falls.  Memory and ability to understand (cognition).  Work and work Statistician. Screening  You may have the following tests or measurements:  Height, weight, and BMI.  Blood pressure.  Lipid and cholesterol levels. These may be checked every 5 years, or more frequently if you are over 48 years old.  Skin check.  Lung cancer screening. You may have this screening every year starting at age 64 if you have a 30-pack-year history of smoking and currently smoke or have quit within the past 15 years.  Fecal occult blood test (FOBT) of the stool. You may have this test every year starting at age 33.  Flexible sigmoidoscopy or colonoscopy. You may have a sigmoidoscopy every 5 years or a colonoscopy every 10 years starting at age 22.  Prostate cancer screening. Recommendations will vary depending on your family history and other risks.  Hepatitis C blood test.  Hepatitis B blood test.  Sexually transmitted disease (STD) testing.  Diabetes screening. This is done by checking your blood sugar (glucose) after you have not eaten for a while (fasting). You may have this done every 1-3 years.  Abdominal aortic aneurysm (AAA) screening. You may need this if you are a current or former smoker.  Osteoporosis. You may be screened starting at age 65 if you are at high risk. Talk with your health care provider about your test results, treatment options, and if necessary, the need for more tests. Vaccines  Your health care provider may recommend certain vaccines, such as:  Influenza  vaccine. This is recommended every year.  Tetanus, diphtheria, and acellular pertussis (Tdap, Td) vaccine. You may need a Td booster every 10 years.  Zoster vaccine. You may need this after age 41.  Pneumococcal 13-valent conjugate (PCV13) vaccine. One dose is recommended after age 23.  Pneumococcal polysaccharide (PPSV23) vaccine. One dose is recommended after age 78. Talk to your health care provider about which screenings and vaccines you need and how often you need them. This information is not intended to replace advice given to you by your health care provider. Make sure you discuss any questions you have with your health care provider. Document Released: 09/09/2015 Document Revised: 05/02/2016 Document Reviewed: 06/14/2015 Elsevier Interactive Patient Education  2017 [place] Prevention in the Home Falls can cause injuries. They can happen to people of all ages. There are many things you can do to make your home safe and to help prevent falls. What can I do on the outside of my home?  Regularly fix the edges of walkways and driveways and fix any cracks.  Remove anything that might make you trip as you walk through a door, such as a raised step or threshold.  Trim any bushes or trees on the path to your home.  Use bright outdoor lighting.  Clear any walking paths of anything that might make someone trip, such as rocks or tools.  Regularly check to see if handrails are loose or broken. Make sure that both sides of any steps have handrails.  Any raised decks and porches should have guardrails on the edges.  Have any leaves, snow, or ice cleared regularly.  Use sand or salt on walking paths during winter.  Clean up any spills in your garage right away. This includes oil or grease spills. What can I do in the bathroom?  Use night lights.  Install grab bars by the toilet and in the tub and shower. Do not use towel bars as grab bars.  Use non-skid mats or decals  in the tub or shower.  If you need to sit down in the shower, use a plastic, non-slip stool.  Keep the floor dry. Clean up any water that spills on the floor as soon as it happens.  Remove soap buildup in the tub or shower regularly.  Attach bath mats securely with double-sided non-slip rug tape.  Do not have throw rugs and other things on the floor that can make you trip. What can I do in the bedroom?  Use night lights.  Make sure that you have a light by your bed that is easy to reach.  Do not use any sheets or blankets that are too big for your bed. They should not hang down onto the floor.  Have a firm chair that has side arms. You can use this for support while you get dressed.  Do not have throw rugs and other things on the floor that can make you trip. What can I do in the kitchen?  Clean up any spills right away.  Avoid walking on wet floors.  Keep items that you use a lot in easy-to-reach places.  If you need to reach something above you, use a strong step  stool that has a grab bar.  Keep electrical cords out of the way.  Do not use floor polish or wax that makes floors slippery. If you must use wax, use non-skid floor wax.  Do not have throw rugs and other things on the floor that can make you trip. What can I do with my stairs?  Do not leave any items on the stairs.  Make sure that there are handrails on both sides of the stairs and use them. Fix handrails that are broken or loose. Make sure that handrails are as long as the stairways.  Check any carpeting to make sure that it is firmly attached to the stairs. Fix any carpet that is loose or worn.  Avoid having throw rugs at the top or bottom of the stairs. If you do have throw rugs, attach them to the floor with carpet tape.  Make sure that you have a light switch at the top of the stairs and the bottom of the stairs. If you do not have them, ask someone to add them for you. What else can I do to help  prevent falls?  Wear shoes that:  Do not have high heels.  Have rubber bottoms.  Are comfortable and fit you well.  Are closed at the toe. Do not wear sandals.  If you use a stepladder:  Make sure that it is fully opened. Do not climb a closed stepladder.  Make sure that both sides of the stepladder are locked into place.  Ask someone to hold it for you, if possible.  Clearly mark and make sure that you can see:  Any grab bars or handrails.  First and last steps.  Where the edge of each step is.  Use tools that help you move around (mobility aids) if they are needed. These include:  Canes.  Walkers.  Scooters.  Crutches.  Turn on the lights when you go into a dark area. Replace any light bulbs as soon as they burn out.  Set up your furniture so you have a clear path. Avoid moving your furniture around.  If any of your floors are uneven, fix them.  If there are any pets around you, be aware of where they are.  Review your medicines with your doctor. Some medicines can make you feel dizzy. This can increase your chance of falling. Ask your doctor what other things that you can do to help prevent falls. This information is not intended to replace advice given to you by your health care provider. Make sure you discuss any questions you have with your health care provider. Document Released: 06/09/2009 Document Revised: 01/19/2016 Document Reviewed: 09/17/2014 Elsevier Interactive Patient Education  2017 [employer].

## 2020-12-14 ENCOUNTER — Other Ambulatory Visit: Payer: Self-pay | Admitting: Family Medicine

## 2020-12-15 NOTE — Telephone Encounter (Signed)
Nurses-please change the directions on this to be 1 taken every 8 hours as needed for pain same quantity same refills thank you

## 2020-12-19 DIAGNOSIS — I5033 Acute on chronic diastolic (congestive) heart failure: Secondary | ICD-10-CM | POA: Diagnosis not present

## 2020-12-19 DIAGNOSIS — G4733 Obstructive sleep apnea (adult) (pediatric): Secondary | ICD-10-CM | POA: Diagnosis not present

## 2020-12-19 DIAGNOSIS — I5023 Acute on chronic systolic (congestive) heart failure: Secondary | ICD-10-CM | POA: Diagnosis not present

## 2020-12-19 DIAGNOSIS — I509 Heart failure, unspecified: Secondary | ICD-10-CM | POA: Diagnosis not present

## 2020-12-20 ENCOUNTER — Telehealth: Payer: Self-pay

## 2020-12-20 MED ORDER — PANTOPRAZOLE SODIUM 40 MG PO TBEC: 40.0000 mg | DELAYED_RELEASE_TABLET | Freq: Every day | ORAL | 1 refills | Status: AC

## 2020-12-20 NOTE — Telephone Encounter (Signed)
Patient wants to have pantoprazole sent to [facility] mail order because it is cheaper.  Would like it switched to that with 90 days supply.

## 2020-12-20 NOTE — Telephone Encounter (Signed)
Ok with me 90  d and 3rf

## 2020-12-20 NOTE — Telephone Encounter (Signed)
Prescription sent electronically to pharmacy. Patient notified. 

## 2020-12-28 ENCOUNTER — Ambulatory Visit: Payer: Medicare HMO | Admitting: Family Medicine

## 2020-12-28 ENCOUNTER — Other Ambulatory Visit: Payer: Self-pay

## 2020-12-28 ENCOUNTER — Ambulatory Visit (INDEPENDENT_AMBULATORY_CARE_PROVIDER_SITE_OTHER): Payer: Medicare HMO | Admitting: Family Medicine

## 2020-12-28 ENCOUNTER — Encounter: Payer: Self-pay | Admitting: Family Medicine

## 2020-12-28 VITALS — BP 138/88 | Temp 97.5°F | Ht 73.0 in | Wt 261.4 lb

## 2020-12-28 DIAGNOSIS — D509 Iron deficiency anemia, unspecified: Secondary | ICD-10-CM | POA: Diagnosis not present

## 2020-12-28 DIAGNOSIS — R531 Weakness: Secondary | ICD-10-CM | POA: Diagnosis not present

## 2020-12-28 DIAGNOSIS — I129 Hypertensive chronic kidney disease with stage 1 through stage 4 chronic kidney disease, or unspecified chronic kidney disease: Secondary | ICD-10-CM | POA: Diagnosis not present

## 2020-12-28 DIAGNOSIS — D631 Anemia in chronic kidney disease: Secondary | ICD-10-CM | POA: Diagnosis not present

## 2020-12-28 DIAGNOSIS — E0865 Diabetes mellitus due to underlying condition with hyperglycemia: Secondary | ICD-10-CM

## 2020-12-28 DIAGNOSIS — Z794 Long term (current) use of insulin: Secondary | ICD-10-CM | POA: Diagnosis not present

## 2020-12-28 DIAGNOSIS — I5042 Chronic combined systolic (congestive) and diastolic (congestive) heart failure: Secondary | ICD-10-CM | POA: Diagnosis not present

## 2020-12-28 DIAGNOSIS — T466X5A Adverse effect of antihyperlipidemic and antiarteriosclerotic drugs, initial encounter: Secondary | ICD-10-CM | POA: Insufficient documentation

## 2020-12-28 DIAGNOSIS — M791 Myalgia, unspecified site: Secondary | ICD-10-CM | POA: Diagnosis not present

## 2020-12-28 DIAGNOSIS — E559 Vitamin D deficiency, unspecified: Secondary | ICD-10-CM | POA: Diagnosis not present

## 2020-12-28 DIAGNOSIS — R5383 Other fatigue: Secondary | ICD-10-CM | POA: Diagnosis not present

## 2020-12-28 DIAGNOSIS — N184 Chronic kidney disease, stage 4 (severe): Secondary | ICD-10-CM | POA: Diagnosis not present

## 2020-12-28 DIAGNOSIS — E1169 Type 2 diabetes mellitus with other specified complication: Secondary | ICD-10-CM | POA: Diagnosis not present

## 2020-12-28 DIAGNOSIS — I739 Peripheral vascular disease, unspecified: Secondary | ICD-10-CM

## 2020-12-28 DIAGNOSIS — E1122 Type 2 diabetes mellitus with diabetic chronic kidney disease: Secondary | ICD-10-CM | POA: Diagnosis not present

## 2020-12-28 DIAGNOSIS — N189 Chronic kidney disease, unspecified: Secondary | ICD-10-CM | POA: Diagnosis not present

## 2020-12-28 DIAGNOSIS — E785 Hyperlipidemia, unspecified: Secondary | ICD-10-CM

## 2020-12-28 DIAGNOSIS — E1129 Type 2 diabetes mellitus with other diabetic kidney complication: Secondary | ICD-10-CM | POA: Diagnosis not present

## 2020-12-28 DIAGNOSIS — R809 Proteinuria, unspecified: Secondary | ICD-10-CM | POA: Diagnosis not present

## 2020-12-28 NOTE — Progress Notes (Signed)
Subjective:    Patient ID: Eugene [name], male    DOB: [birth year], 65 y.o.   MRN: [id number]  Diabetes He presents for his follow-up diabetic visit. He has type 2 diabetes mellitus. Risk factors for coronary artery disease include diabetes mellitus, hypertension and obesity. Current diabetic treatment includes insulin injections and oral agent (monotherapy). His weight is stable. He is following a diabetic diet.   Weakness  Diabetes mellitus due to underlying condition, uncontrolled, with hyperglycemia, with long-term current use of insulin (HCC) - Plan: Basic metabolic panel, Hemoglobin A1c  Iron deficiency anemia, unspecified iron deficiency anemia type  Other fatigue - Plan: CBC with Differential/Platelet, Ferritin, Iron Binding Cap (TIBC)(Labcorp/Sunquest), TSH, T4, free  Hyperlipidemia associated with type 2 diabetes mellitus ([place]) - Plan: Lipid panel  Chronic kidney disease, stage 4 (severe) ([place]) - Plan: Basic metabolic panel  PAD (peripheral artery disease) (HCC)  Myalgia due to statin  Patient has never been able to tolerate statins because of myalgias Has a lot of fatigue tiredness feeling rundown he wonders if his iron could be low He does have hyperlipidemia he does try to watch his diet Also has stage IV kidney disease has a difficult time with this He has seen multiple specialists Recently in the hospital for STEMI Has followed up with cardiology    Review of Systems     Objective:   Physical Exam Vitals reviewed.  Constitutional:      General: He is not in acute distress. HENT:     Head: Normocephalic and atraumatic.  Eyes:     General:        Right eye: No discharge.        Left eye: No discharge.  Neck:     Trachea: No tracheal deviation.  Cardiovascular:     Rate and Rhythm: Normal rate and regular rhythm.     Heart sounds: Normal heart sounds. No murmur heard.   Pulmonary:     Effort: Pulmonary effort is normal. No respiratory  distress.     Breath sounds: Normal breath sounds.  Lymphadenopathy:     Cervical: No cervical adenopathy.  Skin:    General: Skin is warm and dry.  Neurological:     Mental Status: He is alert.     Coordination: Coordination normal.  Psychiatric:        Behavior: Behavior normal.    Diabetic foot exam neuropathy edema some calluses no ulcers today       Assessment & Plan:  1. Weakness Significant weakness uses a staff to walk around with.  Importance of trying to stay physically active weakness is multifactorial related to age severe diabetes anemia of probable chronic disease as well as depressed ejection fraction and kidney disease  2. Diabetes mellitus due to underlying condition, uncontrolled, with hyperglycemia, with long-term current use of insulin ([place]) Patient did have a low sugar because he took too much insulin we warned him about this otherwise he will stick with the plan work hard on diet and he will follow-up with labs by early July - Basic metabolic panel - Hemoglobin A1c Patient was educated regarding what sugar parameters to watch for and when to reach out to [place] he went to hold off on his medicine thank you  3. Iron deficiency anemia, unspecified iron deficiency anemia type We will check lab work await the results of this.  4. Other fatigue See discussion above I believe that this is multifactorial. - CBC with Differential/Platelet - Ferritin - Iron  Binding Cap (TIBC)([facility]/Sunquest) - TSH - T4, free  5. Hyperlipidemia associated with type 2 diabetes mellitus (HCC) Check lipid panel.  Does not tolerate statins - Lipid panel  6. Chronic kidney disease, stage 4 (severe) ([place]) Follows with kidney doctor recheck lab work by late June - Basic metabolic panel  7. PAD (peripheral artery disease) ([place]) Peripheral artery disease decent control currently no sign of any arterial ulcers continue to try to keep diabetes under decent control Recheck by early  July

## 2021-01-02 ENCOUNTER — Other Ambulatory Visit: Payer: Self-pay

## 2021-01-02 ENCOUNTER — Encounter (INDEPENDENT_AMBULATORY_CARE_PROVIDER_SITE_OTHER): Payer: Medicare HMO | Admitting: Ophthalmology

## 2021-01-02 DIAGNOSIS — H35033 Hypertensive retinopathy, bilateral: Secondary | ICD-10-CM

## 2021-01-02 DIAGNOSIS — H43813 Vitreous degeneration, bilateral: Secondary | ICD-10-CM | POA: Diagnosis not present

## 2021-01-02 DIAGNOSIS — E113512 Type 2 diabetes mellitus with proliferative diabetic retinopathy with macular edema, left eye: Secondary | ICD-10-CM

## 2021-01-02 DIAGNOSIS — I10 Essential (primary) hypertension: Secondary | ICD-10-CM

## 2021-01-02 DIAGNOSIS — E113311 Type 2 diabetes mellitus with moderate nonproliferative diabetic retinopathy with macular edema, right eye: Secondary | ICD-10-CM | POA: Diagnosis not present

## 2021-01-02 DIAGNOSIS — D3132 Benign neoplasm of left choroid: Secondary | ICD-10-CM | POA: Diagnosis not present

## 2021-01-03 ENCOUNTER — Ambulatory Visit: Payer: Medicare HMO | Admitting: Nurse Practitioner

## 2021-01-10 ENCOUNTER — Telehealth: Payer: Self-pay

## 2021-01-10 MED ORDER — LOSARTAN POTASSIUM 25 MG PO TABS: 25.0000 mg | ORAL_TABLET | Freq: Every day | ORAL | 1 refills | Status: AC

## 2021-01-10 NOTE — Telephone Encounter (Signed)
[employer] needs approval on losartan (COZAAR) 25 MG tablet said they have tried to contact for patient? Eugene [name] called to check on this.   [facility] mail order   Pt call back [phone]

## 2021-01-10 NOTE — Telephone Encounter (Signed)
Please verify patient is on losartan.  If so he may have 90-day with 1 refill

## 2021-01-10 NOTE — Telephone Encounter (Signed)
Pt contacted and verbalized that he is taking Losartan 25 mg once daily. 90 day supply sent to mail order pharmacy

## 2021-01-10 NOTE — Telephone Encounter (Signed)
Last filled by historical provider. Pt needing refills. Please advise. Thank you

## 2021-01-13 ENCOUNTER — Ambulatory Visit: Payer: Medicare HMO | Admitting: Gastroenterology

## 2021-01-13 ENCOUNTER — Telehealth: Payer: Self-pay | Admitting: Family Medicine

## 2021-01-16 ENCOUNTER — Telehealth: Payer: Self-pay | Admitting: Family Medicine

## 2021-01-16 NOTE — Telephone Encounter (Signed)
Nurses Please notify [facility] that I did sign off on the death certificate via [place]  If they need anything additional please let me know Thank you

## 2021-01-16 NOTE — Telephone Encounter (Signed)
FYI-I did speak with his wife-it sounds as if he had sudden death during the night due to cardiovascular disease

## 2021-01-18 DIAGNOSIS — I5023 Acute on chronic systolic (congestive) heart failure: Secondary | ICD-10-CM | POA: Diagnosis not present

## 2021-01-18 DIAGNOSIS — I509 Heart failure, unspecified: Secondary | ICD-10-CM | POA: Diagnosis not present

## 2021-01-18 DIAGNOSIS — G4733 Obstructive sleep apnea (adult) (pediatric): Secondary | ICD-10-CM | POA: Diagnosis not present

## 2021-01-18 DIAGNOSIS — I5033 Acute on chronic diastolic (congestive) heart failure: Secondary | ICD-10-CM | POA: Diagnosis not present

## 2021-01-20 NOTE — Telephone Encounter (Signed)
Was [facility] notified per Dr. Nicki Reaper

## 2021-01-20 NOTE — Telephone Encounter (Signed)
Thank you [facility] for doing this.

## 2021-01-20 NOTE — Telephone Encounter (Signed)
No the nurses did not contact [facility]. Usually someone at the front does that. Can you check with front staff. I never saw a death certificate. It usually goes to the front I thought so who ever got the death certificate should of called. Please find out and make sure this was handled. thanks

## 2021-01-20 NOTE — Telephone Encounter (Signed)
I will call [name] and notify them but Patient was at [facility] in [place]. I will call both places.

## 2021-01-25 NOTE — Telephone Encounter (Signed)
EMS calling to report they found patient DOA this morning at 10:43 AM. Wife found him this morning. Per paramedic, pt went to bed last night and was fine but was having some nausea vomiting and shortness of breath. EMS wanting to know if provider would sign off on death certificate. Informed EMS that provider was out of office today and will be back Monday.

## 2021-01-25 DEATH — deceased

## 2021-02-28 ENCOUNTER — Ambulatory Visit: Payer: Medicare HMO | Admitting: Family Medicine

## 2021-03-01 ENCOUNTER — Ambulatory Visit: Payer: Medicare HMO | Admitting: Podiatry

## 2021-03-06 ENCOUNTER — Ambulatory Visit: Payer: Medicare HMO | Admitting: Gastroenterology

## 2021-03-20 ENCOUNTER — Encounter (INDEPENDENT_AMBULATORY_CARE_PROVIDER_SITE_OTHER): Payer: Medicare HMO | Admitting: Ophthalmology

## 2021-04-20 ENCOUNTER — Other Ambulatory Visit: Payer: Medicare HMO

## 2021-04-24 ENCOUNTER — Ambulatory Visit: Payer: Medicare HMO | Admitting: Cardiology

## 2022-05-07 IMAGING — DX DG KNEE COMPLETE 4+V*L*
4 series · 4 of 4 positions shown · non-contrast
Comparison: None.

CLINICAL DATA: Anterolateral left knee pain for 2 weeks, no injury.

EXAM:
LEFT KNEE - COMPLETE 4+ VIEW

[knee ap]
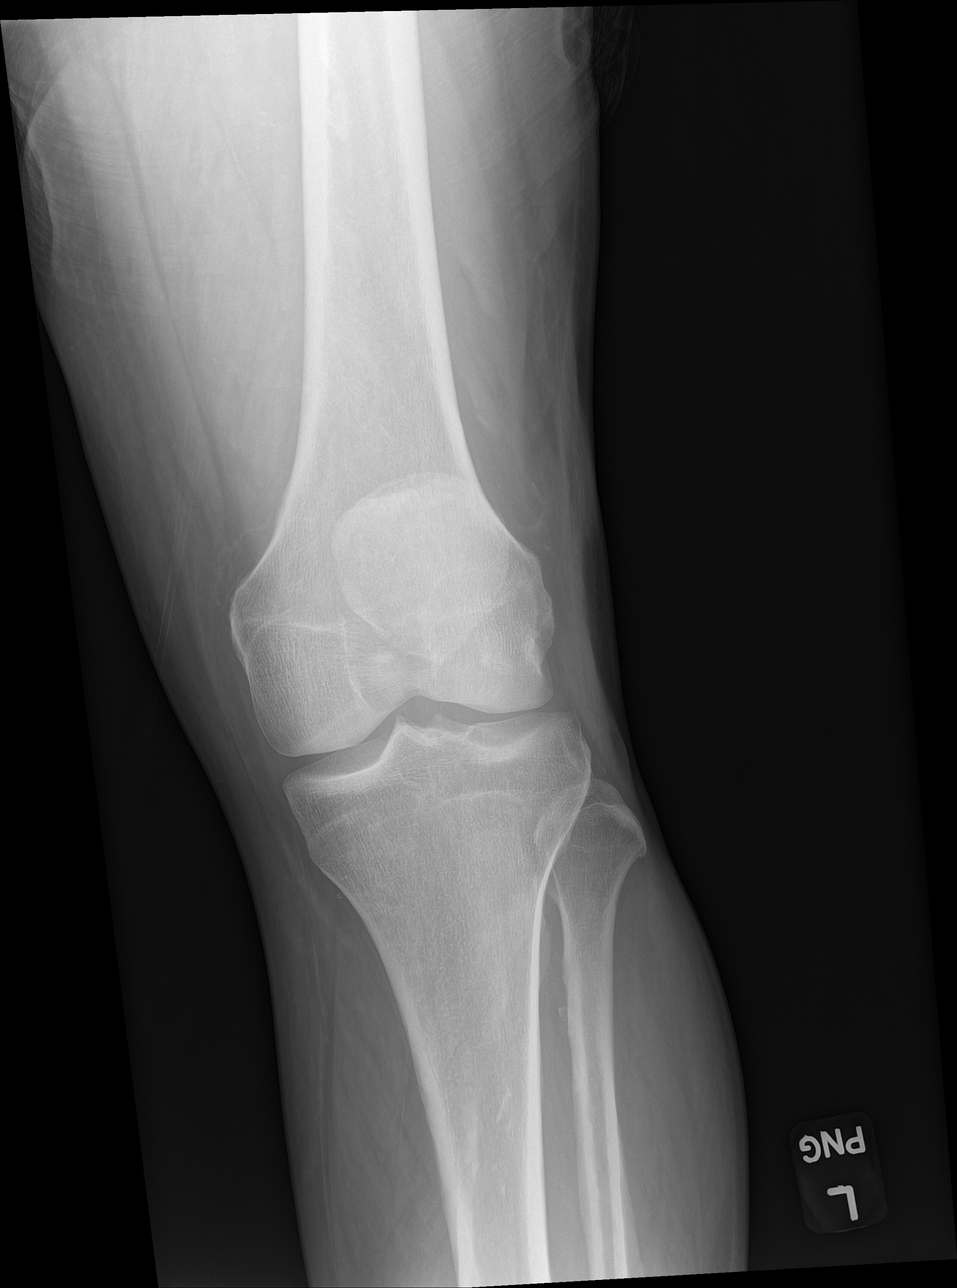

[knee obl (1 of 2)]
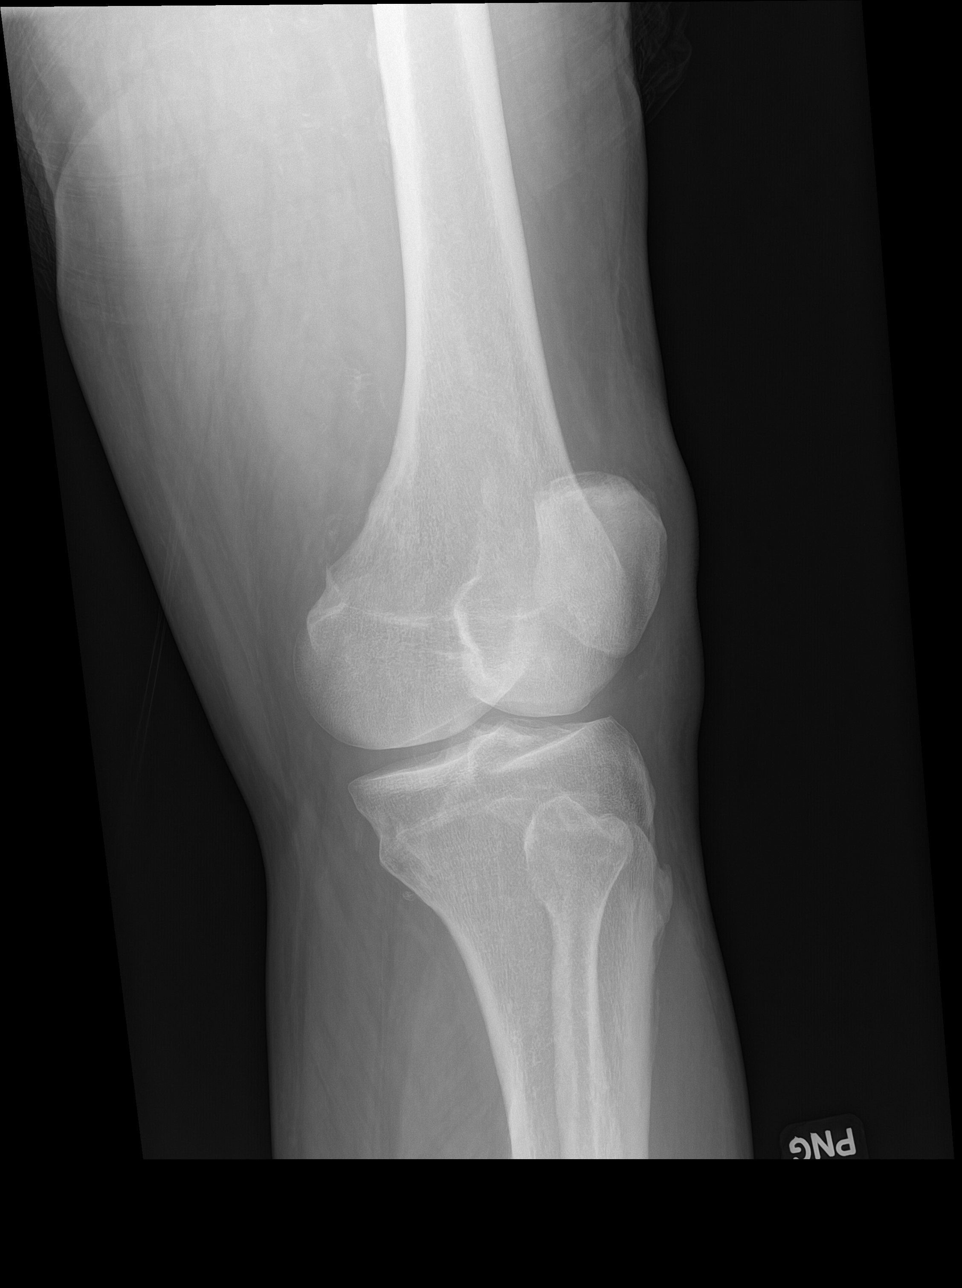

[knee obl (2 of 2)]
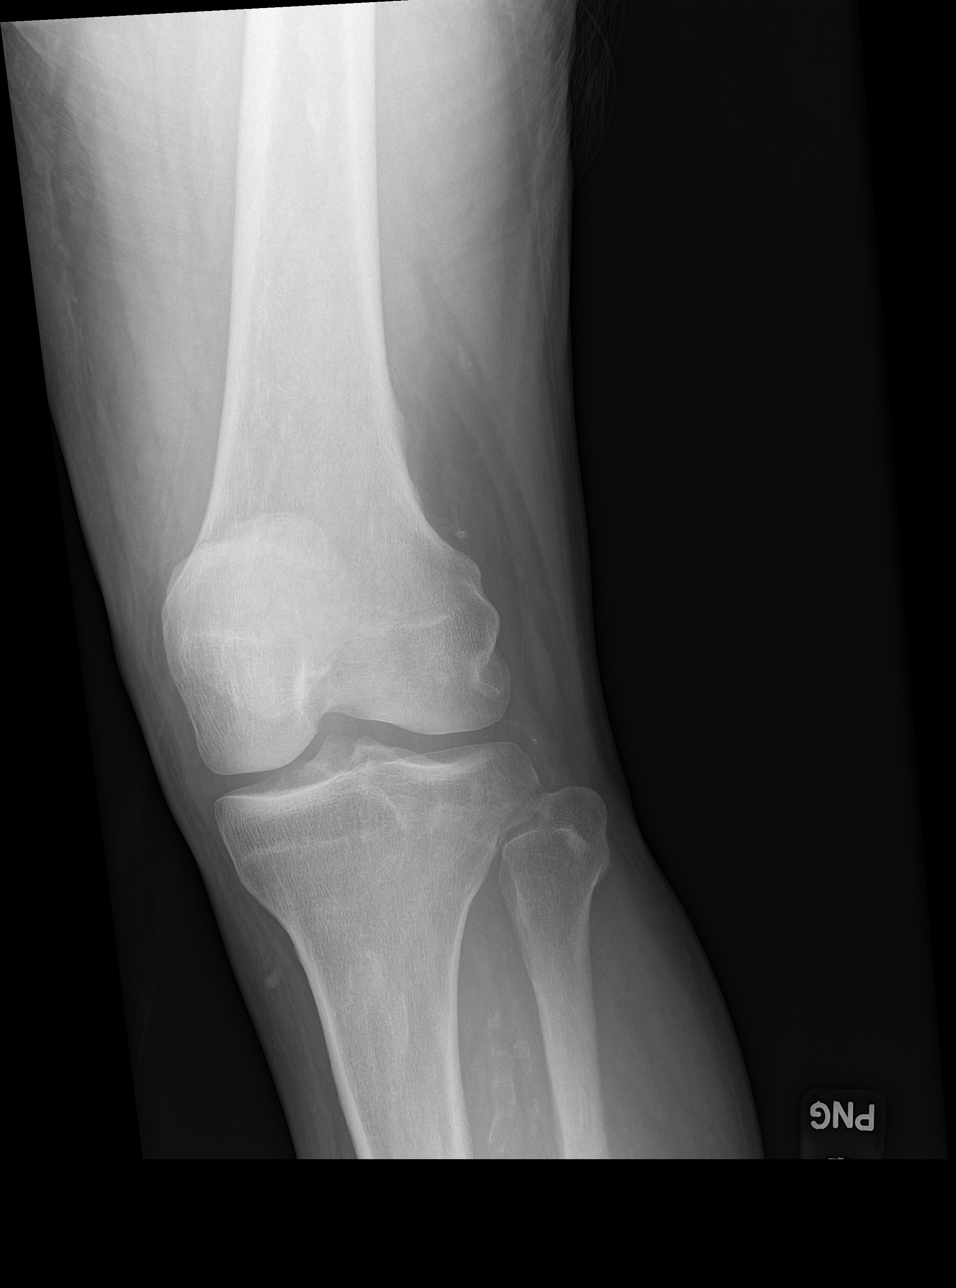

[knee lat]
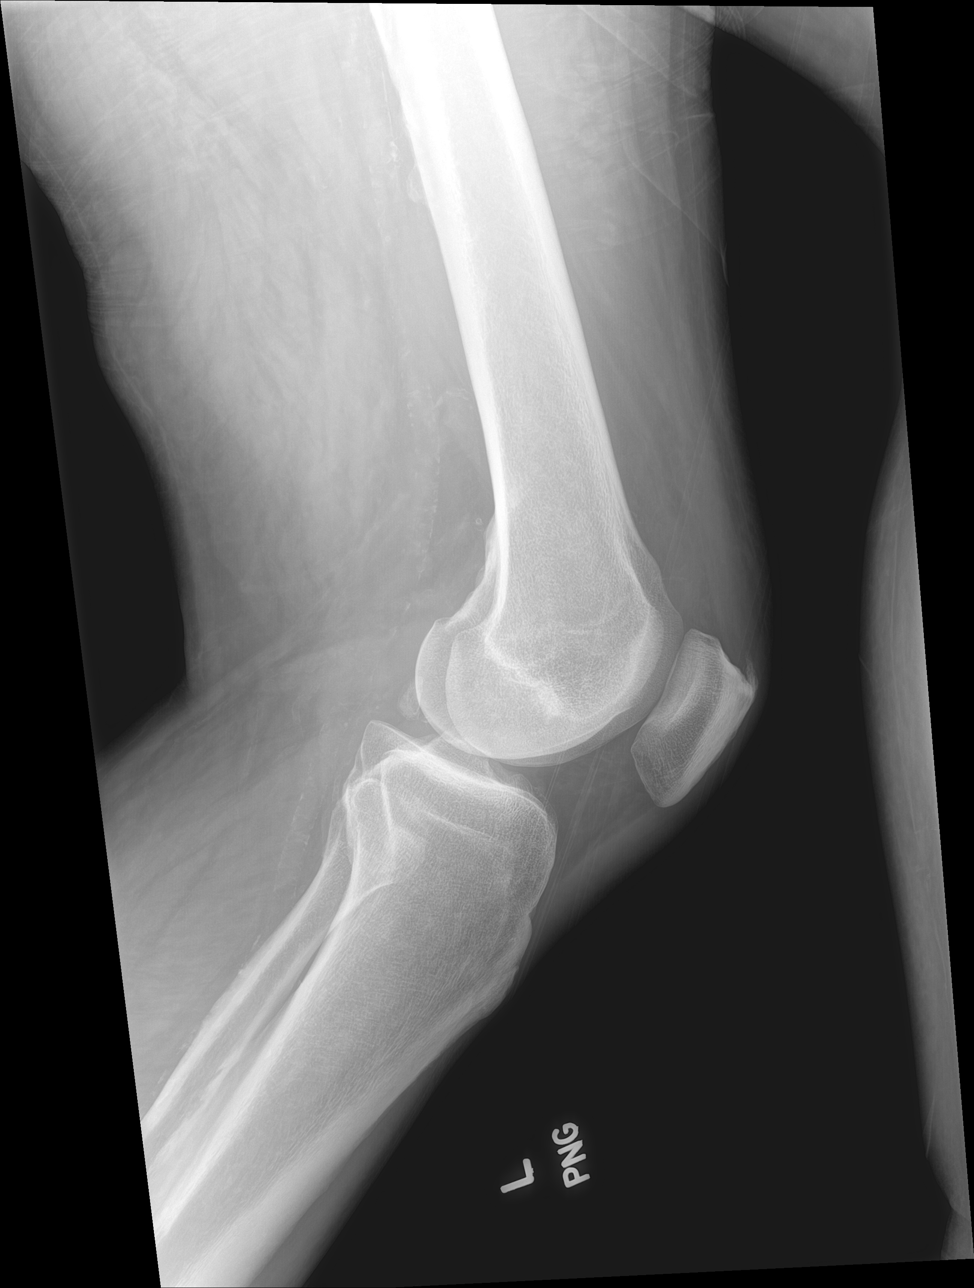

[4 of 4 positions shown; findings below may reference images not displayed]

FINDINGS: No acute osseous or joint abnormality. No degenerative changes.
Vascular calcifications.
IMPRESSION: No findings to explain the patient's pain.
